# Patient Record
Sex: Male | Born: 1955 | Race: White | Hispanic: No | Marital: Married | Smoking: Former smoker
Health system: Southern US, Community
[De-identification: ages and names within clinical notes are randomized; demographics above are authoritative.]

## PROBLEM LIST (undated history)

## (undated) DIAGNOSIS — I480 Paroxysmal atrial fibrillation: Secondary | ICD-10-CM

## (undated) DIAGNOSIS — I499 Cardiac arrhythmia, unspecified: Secondary | ICD-10-CM

## (undated) DIAGNOSIS — Z8719 Personal history of other diseases of the digestive system: Secondary | ICD-10-CM

## (undated) DIAGNOSIS — I639 Cerebral infarction, unspecified: Secondary | ICD-10-CM

## (undated) DIAGNOSIS — H547 Unspecified visual loss: Secondary | ICD-10-CM

## (undated) DIAGNOSIS — E118 Type 2 diabetes mellitus with unspecified complications: Secondary | ICD-10-CM

## (undated) DIAGNOSIS — I96 Gangrene, not elsewhere classified: Secondary | ICD-10-CM

## (undated) DIAGNOSIS — I739 Peripheral vascular disease, unspecified: Secondary | ICD-10-CM

## (undated) DIAGNOSIS — H548 Legal blindness, as defined in USA: Secondary | ICD-10-CM

## (undated) DIAGNOSIS — I10 Essential (primary) hypertension: Secondary | ICD-10-CM

## (undated) DIAGNOSIS — I77810 Thoracic aortic ectasia: Secondary | ICD-10-CM

## (undated) DIAGNOSIS — R413 Other amnesia: Secondary | ICD-10-CM

## (undated) DIAGNOSIS — I63532 Cerebral infarction due to unspecified occlusion or stenosis of left posterior cerebral artery: Secondary | ICD-10-CM

## (undated) DIAGNOSIS — I2510 Atherosclerotic heart disease of native coronary artery without angina pectoris: Secondary | ICD-10-CM

## (undated) DIAGNOSIS — I70269 Atherosclerosis of native arteries of extremities with gangrene, unspecified extremity: Secondary | ICD-10-CM

## (undated) DIAGNOSIS — Z9289 Personal history of other medical treatment: Secondary | ICD-10-CM

## (undated) DIAGNOSIS — E785 Hyperlipidemia, unspecified: Secondary | ICD-10-CM

## (undated) DIAGNOSIS — Z9889 Other specified postprocedural states: Secondary | ICD-10-CM

## (undated) DIAGNOSIS — M79606 Pain in leg, unspecified: Secondary | ICD-10-CM

## (undated) DIAGNOSIS — R918 Other nonspecific abnormal finding of lung field: Secondary | ICD-10-CM

## (undated) DIAGNOSIS — I4891 Unspecified atrial fibrillation: Secondary | ICD-10-CM

## (undated) DIAGNOSIS — K219 Gastro-esophageal reflux disease without esophagitis: Secondary | ICD-10-CM

## (undated) DIAGNOSIS — D649 Anemia, unspecified: Secondary | ICD-10-CM

## (undated) DIAGNOSIS — K5520 Angiodysplasia of colon without hemorrhage: Secondary | ICD-10-CM

## (undated) HISTORY — DX: Cerebral infarction due to unspecified occlusion or stenosis of left posterior cerebral artery: I63.532

## (undated) HISTORY — DX: Thoracic aortic ectasia: I77.810

## (undated) HISTORY — DX: Other specified postprocedural states: Z98.890

## (undated) HISTORY — DX: Personal history of other diseases of the digestive system: Z87.19

## (undated) HISTORY — DX: Hyperlipidemia, unspecified: E78.5

## (undated) HISTORY — DX: Atherosclerotic heart disease of native coronary artery without angina pectoris: I25.10

## (undated) HISTORY — DX: Type 2 diabetes mellitus with unspecified complications: E11.8

## (undated) HISTORY — DX: Personal history of other medical treatment: Z92.89

## (undated) HISTORY — PX: APPENDECTOMY: SHX54

## (undated) HISTORY — DX: Gangrene, not elsewhere classified: I96

## (undated) HISTORY — PX: TONSILLECTOMY: SUR1361

## (undated) HISTORY — DX: Other nonspecific abnormal finding of lung field: R91.8

## (undated) HISTORY — DX: Essential (primary) hypertension: I10

## (undated) HISTORY — DX: Paroxysmal atrial fibrillation: I48.0

## (undated) HISTORY — PX: ABDOMINAL AORTA STENT: SHX1108

## (undated) HISTORY — PX: HERNIA REPAIR: SHX51

## (undated) HISTORY — DX: Atherosclerosis of native arteries of extremities with gangrene, unspecified extremity: I70.269

---

## 2015-01-21 ENCOUNTER — Other Ambulatory Visit: Payer: Self-pay

## 2015-01-21 ENCOUNTER — Other Ambulatory Visit: Payer: Self-pay | Admitting: Family Medicine

## 2015-01-21 ENCOUNTER — Encounter: Payer: Self-pay | Admitting: Family Medicine

## 2015-01-21 ENCOUNTER — Ambulatory Visit (INDEPENDENT_AMBULATORY_CARE_PROVIDER_SITE_OTHER): Payer: Managed Care, Other (non HMO) | Admitting: Family Medicine

## 2015-01-21 VITALS — BP 127/85 | HR 96 | Temp 98.5°F | Resp 18 | Ht 69.0 in | Wt 202.0 lb

## 2015-01-21 DIAGNOSIS — E785 Hyperlipidemia, unspecified: Secondary | ICD-10-CM

## 2015-01-21 DIAGNOSIS — E1165 Type 2 diabetes mellitus with hyperglycemia: Secondary | ICD-10-CM | POA: Diagnosis not present

## 2015-01-21 DIAGNOSIS — E119 Type 2 diabetes mellitus without complications: Secondary | ICD-10-CM

## 2015-01-21 DIAGNOSIS — I10 Essential (primary) hypertension: Secondary | ICD-10-CM | POA: Diagnosis not present

## 2015-01-21 DIAGNOSIS — H53131 Sudden visual loss, right eye: Secondary | ICD-10-CM

## 2015-01-21 DIAGNOSIS — E782 Mixed hyperlipidemia: Secondary | ICD-10-CM | POA: Insufficient documentation

## 2015-01-21 DIAGNOSIS — IMO0002 Reserved for concepts with insufficient information to code with codable children: Secondary | ICD-10-CM | POA: Insufficient documentation

## 2015-01-21 DIAGNOSIS — E669 Obesity, unspecified: Secondary | ICD-10-CM

## 2015-01-21 DIAGNOSIS — I129 Hypertensive chronic kidney disease with stage 1 through stage 4 chronic kidney disease, or unspecified chronic kidney disease: Secondary | ICD-10-CM | POA: Insufficient documentation

## 2015-01-21 DIAGNOSIS — H53139 Sudden visual loss, unspecified eye: Secondary | ICD-10-CM | POA: Insufficient documentation

## 2015-01-21 LAB — CBC WITH DIFFERENTIAL/PLATELET
Hematocrit: 41.7 %
Hemoglobin: 14.8 g/dL
LYMPHS ABS: 2 10*3/uL
Lymphs: 22 %
MCH: 31.3 pg
MCHC: 35.5 g/dL
MCV: 88 fL
MID (ABSOLUTE): 0.8 10*3/uL
MID: 9 %
Neutrophils Absolute: 6.1 10*3/uL
Neutrophils: 69 %
Platelets: 304 10*3/uL (ref 150–379)
RBC: 4.73 x10E6/uL
RDW: 12.5 %
WBC: 8.9 10*3/uL (ref 3.4–10.8)

## 2015-01-21 LAB — LIPID PANEL PICCOLO, WAIVED
CHOL/HDL RATIO PICCOLO,WAIVE: 4.5 mg/dL
CHOLESTEROL PICCOLO, WAIVED: 190 mg/dL (ref <200)
HDL Chol Piccolo, Waived: 42 mg/dL — ABNORMAL LOW (ref >59)
LDL Chol Calc Piccolo Waived: 101 mg/dL — ABNORMAL HIGH (ref <100)
Triglycerides Piccolo,Waived: 231 mg/dL — ABNORMAL HIGH (ref <150)
VLDL Chol Calc Piccolo,Waive: 46 mg/dL — ABNORMAL HIGH (ref <30)

## 2015-01-21 LAB — MICROALBUMIN, URINE WAIVED
CREATININE, URINE WAIVED: 300 mg/dL (ref 10–300)
MICROALB, UR WAIVED: 30 mg/L — AB (ref 0–19)
Microalb/Creat Ratio: <30 mg/g (ref <30)

## 2015-01-21 LAB — BAYER DCA HB A1C WAIVED: HB A1C (BAYER DCA - WAIVED): 8.5 % — ABNORMAL HIGH (ref <7.0)

## 2015-01-21 MED ORDER — METFORMIN HCL ER (OSM) 1000 MG PO TB24: 1000.0000 mg | ORAL_TABLET | Freq: Every day | ORAL | Status: DC

## 2015-01-21 NOTE — Assessment & Plan Note (Signed)
Under fair control with LDL 101 even not fasting. No need for meds just yet, continue to monitor. Recheck 6 months.

## 2015-01-21 NOTE — Patient Instructions (Addendum)
Smoking Cessation Quitting smoking is important to your health and has many advantages. However, it is not always easy to quit since nicotine is a very addictive drug. Oftentimes, people try 3 times or more before being able to quit. This document explains the best ways for you to prepare to quit smoking. Quitting takes hard work and a lot of effort, but you can do it. ADVANTAGES OF QUITTING SMOKING  You will live longer, feel better, and live better.  Your body will feel the impact of quitting smoking almost immediately.  Within 20 minutes, blood pressure decreases. Your pulse returns to its normal level.  After 8 hours, carbon monoxide levels in the blood return to normal. Your oxygen level increases.  After 24 hours, the chance of having a heart attack starts to decrease. Your breath, hair, and body stop smelling like smoke.  After 48 hours, damaged nerve endings begin to recover. Your sense of taste and smell improve.  After 72 hours, the body is virtually free of nicotine. Your bronchial tubes relax and breathing becomes easier.  After 2 to 12 weeks, lungs can hold more air. Exercise becomes easier and circulation improves.  The risk of having a heart attack, stroke, cancer, or lung disease is greatly reduced.  After 1 year, the risk of coronary heart disease is cut in half.  After 5 years, the risk of stroke falls to the same as a nonsmoker.  After 10 years, the risk of lung cancer is cut in half and the risk of other cancers decreases significantly.  After 15 years, the risk of coronary heart disease drops, usually to the level of a nonsmoker.  If you are pregnant, quitting smoking will improve your chances of having a healthy baby.  The people you live with, especially any children, will be healthier.  You will have extra money to spend on things other than cigarettes. QUESTIONS TO THINK ABOUT BEFORE ATTEMPTING TO QUIT You may want to talk about your answers with your  health care provider.  Why do you want to quit?  If you tried to quit in the past, what helped and what did not?  What will be the most difficult situations for you after you quit? How will you plan to handle them?  Who can help you through the tough times? Your family? Friends? A health care provider?  What pleasures do you get from smoking? What ways can you still get pleasure if you quit? Here are some questions to ask your health care provider:  How can you help me to be successful at quitting?  What medicine do you think would be best for me and how should I take it?  What should I do if I need more help?  What is smoking withdrawal like? How can I get information on withdrawal? GET READY  Set a quit date.  Change your environment by getting rid of all cigarettes, ashtrays, matches, and lighters in your home, car, or work. Do not let people smoke in your home.  Review your past attempts to quit. Think about what worked and what did not. GET SUPPORT AND ENCOURAGEMENT You have a better chance of being successful if you have help. You can get support in many ways.  Tell your family, friends, and coworkers that you are going to quit and need their support. Ask them not to smoke around you.  Get individual, group, or telephone counseling and support. Programs are available at local hospitals and health centers. Call   your local health department for information about programs in your area.  Spiritual beliefs and practices may help some smokers quit.  Download a "quit meter" on your computer to keep track of quit statistics, such as how long you have gone without smoking, cigarettes not smoked, and money saved.  Get a self-help book about quitting smoking and staying off tobacco. [place] yourself from urges to smoke. Talk to someone, go for a walk, or occupy your time with a task.  Change your normal routine. Take a different route to work.  Drink tea instead of coffee. Eat breakfast in a different place.  Reduce your stress. Take a hot bath, exercise, or read a book.  Plan something enjoyable to do every day. Reward yourself for not smoking.  Explore interactive web-based programs that specialize in helping you quit. GET MEDICINE AND USE IT CORRECTLY Medicines can help you stop smoking and decrease the urge to smoke. Combining medicine with the above behavioral methods and support can greatly increase your chances of successfully quitting smoking.  Nicotine replacement therapy helps deliver nicotine to your body without the negative effects and risks of smoking. Nicotine replacement therapy includes nicotine gum, lozenges, inhalers, nasal sprays, and skin patches. Some may be available over-the-counter and others require a prescription.  Antidepressant medicine helps people abstain from smoking, but how this works is unknown. This medicine is available by prescription.  Nicotinic receptor partial agonist medicine simulates the effect of nicotine in your brain. This medicine is available by prescription. Ask your health care provider for advice about which medicines to use and how to use them based on your health history. Your health care provider will tell you what side effects to look out for if you choose to be on a medicine or therapy. Carefully read the information on the package. Do not use any other product containing nicotine while using a nicotine replacement product.  RELAPSE OR DIFFICULT SITUATIONS Most relapses occur within the first 3 months after quitting. Do not be discouraged if you start smoking again. Remember, most people try several times before finally quitting. You may have symptoms of withdrawal because your body is used to nicotine. You may crave cigarettes, be irritable, feel very hungry, cough often, get headaches, or have difficulty concentrating. The withdrawal symptoms are only temporary. They are strongest  when you first quit, but they will go away within 10-14 days. To reduce the chances of relapse, try to:  Avoid drinking alcohol. Drinking lowers your chances of successfully quitting.  Reduce the amount of caffeine you consume. Once you quit smoking, the amount of caffeine in your body increases and can give you symptoms, such as a rapid heartbeat, sweating, and anxiety.  Avoid smokers because they can make you want to smoke.  Do not let weight gain distract you. Many smokers will gain weight when they quit, usually less than 10 pounds. Eat a healthy diet and stay active. You can always lose the weight gained after you quit.  Find ways to improve your mood other than smoking. FOR MORE INFORMATION  www.smokefree.gov  Document Released: 07/14/2001 Document Revised: 12/04/2013 Document Reviewed: 10/29/2011 [facility] Patient Information 2015 [place], [place]. This information is not intended to replace advice given to you by your health care provider. Make sure you discuss any questions you have with your health care provider. Ischemic Stroke A stroke (cerebrovascular accident) is the sudden death of brain tissue. It is a medical emergency. A stroke can cause  permanent loss of brain function. This can cause problems with different parts of your body. A transient ischemic attack (TIA) is different because it does not cause permanent damage. A TIA is a short-lived problem of poor blood flow affecting a part of the brain. A TIA is also a serious problem because having a TIA greatly increases the chances of having a stroke. When symptoms first develop, you cannot know if the problem might be a stroke or a TIA. CAUSES  A stroke is caused by a decrease of oxygen supply to an area of your brain. It is usually the result of a small blood clot or collection of cholesterol or fat (plaque) that blocks blood flow in the brain. A stroke can also be caused by blocked or damaged carotid arteries.  RISK  FACTORS  High blood pressure (hypertension).  High cholesterol.  Diabetes mellitus.  Heart disease.  The buildup of plaque in the blood vessels (peripheral artery disease or atherosclerosis).  The buildup of plaque in the blood vessels providing blood and oxygen to the brain (carotid artery stenosis).  An abnormal heart rhythm (atrial fibrillation).  Obesity.  Smoking.  Taking oral contraceptives (especially in combination with smoking).  Physical inactivity.  A diet high in fats, salt (sodium), and calories.  Alcohol use.  Use of illegal drugs (especially cocaine and methamphetamine).  Being African American.  Being over the age of 55.  Family history of stroke.  Previous history of blood clots, stroke, TIA, or heart attack.  Sickle cell disease. SYMPTOMS  These symptoms usually develop suddenly, or may be newly present upon awakening from sleep:  Sudden weakness or numbness of the face, arm, or leg, especially on one side of the body.  Sudden trouble walking or difficulty moving arms or legs.  Sudden confusion.  Sudden personality changes.  Trouble speaking (aphasia) or understanding.  Difficulty swallowing.  Sudden trouble seeing in one or both eyes.  Double vision.  Dizziness.  Loss of balance or coordination.  Sudden severe headache with no known cause.  Trouble reading or writing. DIAGNOSIS  Your health care provider can often determine the presence or absence of a stroke based on your symptoms, history, and physical exam. Computed tomography (CT) of the brain is usually performed to confirm the stroke, determine causes, and determine stroke severity. Other tests may be done to find the cause of the stroke. These tests may include:  Electrocardiography.  Continuous heart monitoring.  Echocardiography.  Carotid ultrasonography.  Magnetic resonance imaging (MRI).  A scan of the brain circulation.  Blood tests. PREVENTION  The risk  of a stroke can be decreased by appropriately treating high blood pressure, high cholesterol, diabetes, heart disease, and obesity and by quitting smoking, limiting alcohol, and staying physically active. TREATMENT  Time is of the essence. It is important to seek treatment at the first sign of these symptoms because you may receive a medicine to dissolve the clot (thrombolytic) that cannot be given if too much time has passed since your symptoms began. Even if you do not know when your symptoms began, get treatment as soon as possible as there are other treatment options available including oxygen, intravenous (IV) fluids, and medicines to thin the blood (anticoagulants). Treatment of stroke depends on the duration, severity, and cause of your symptoms. Medicines and dietary changes may be used to address diabetes, high blood pressure, and other risk factors. Physical, speech, and occupational therapists will assess you and work with you to improve any functions  impaired by the stroke. Measures will be taken to prevent short-term and long-term complications, including infection from breathing foreign material into the lungs (aspiration pneumonia), blood clots in the legs, bedsores, and falls. Rarely, surgery may be needed to remove large blood clots or to open up blocked arteries. HOME CARE INSTRUCTIONS   Take medicines only as directed by your health care provider. Follow the directions carefully. Medicines may be used to control risk factors for a stroke. Be sure you understand all your medicine instructions.  You may be told to take a medicine to thin the blood, such as aspirin or the anticoagulant warfarin. Warfarin needs to be taken exactly as instructed.  Too much and too little warfarin are both dangerous. Too much warfarin increases the risk of bleeding. Too little warfarin continues to allow the risk for blood clots. While taking warfarin, you will need to have regular blood tests to measure your  blood clotting time. These blood tests usually include both the PT and INR tests. The PT and INR results allow your health care provider to adjust your dose of warfarin. The dose can change for many reasons. It is critically important that you take warfarin exactly as prescribed, and that you have your PT and INR levels drawn exactly as directed.  Many foods, especially foods high in vitamin K, can interfere with warfarin and affect the PT and INR results. Foods high in vitamin K include spinach, kale, broccoli, cabbage, collard and turnip greens, brussels sprouts, peas, cauliflower, seaweed, and parsley, as well as beef and pork liver, green tea, and soybean oil. You should eat a consistent amount of foods high in vitamin K. Avoid major changes in your diet, or notify your health care provider before changing your diet. Arrange a visit with a dietitian to answer your questions.  Many medicines can interfere with warfarin and affect the PT and INR results. You must tell your health care provider about any and all medicines you take. This includes all vitamins and supplements. Be especially cautious with aspirin and anti-inflammatory medicines. Do not take or discontinue any prescribed or over-the-counter medicine except on the advice of your health care provider or pharmacist.  Warfarin can have side effects, such as excessive bruising or bleeding. You will need to hold pressure over cuts for longer than usual. Your health care provider or pharmacist will discuss other potential side effects.  Avoid sports or activities that may cause injury or bleeding.  Be mindful when shaving, flossing your teeth, or handling sharp objects.  Alcohol can change the body's ability to handle warfarin. It is best to avoid alcoholic drinks or consume only very small amounts while taking warfarin. Notify your health care provider if you change your alcohol intake.  Notify your dentist or other health care providers before  procedures.  If swallow studies have determined that your swallowing reflex is present, you should eat healthy foods. Including 5 or more servings of fruits and vegetables a day may reduce the risk of stroke. Foods may need to be a certain consistency (soft or pureed), or small bites may need to be taken in order to avoid aspirating or choking. Certain dietary changes may be advised to address high blood pressure, high cholesterol, diabetes, or obesity.  Food choices that are low in sodium, saturated fat, trans fat, and cholesterol are recommended to manage high blood pressure.  Food choies that are high in fiber, and low in saturated fat, trans fat, and cholesterol may control cholesterol levels.  Controlling carbohydrates and sugar intake is recommended to manage diabetes.  Reducing calorie intake and making food choices that are low in sodium, saturated fat, trans fat, and cholesterol are recommended to manage obesity.  Maintain a healthy weight.  Stay physically active. It is recommended that you get at least 30 minutes of activity on all or most days.  Do not use any tobacco products including cigarettes, chewing tobacco, or electronic cigarettes.  Limit alcohol use even if you are not taking warfarin. Moderate alcohol use is considered to be:  No more than 2 drinks each day for men.  No more than 1 drink each day for nonpregnant women.  Home safety. A safe home environment is important to reduce the risk of falls. Your health care provider may arrange for specialists to evaluate your home. Having grab bars in the bedroom and bathroom is often important. Your health care provider may arrange for equipment to be used at home, such as raised toilets and a seat for the shower.  Physical, occupational, and speech therapy. Ongoing therapy may be needed to maximize your recovery after a stroke. If you have been advised to use a walker or a cane, use it at all times. Be sure to keep your  therapy appointments.  Follow all instructions for follow-up with your health care provider. This is very important. This includes any referrals, physical therapy, rehabilitation, and lab tests. Proper follow-up can prevent another stroke from occurring. SEEK MEDICAL CARE IF:  You have personality changes.  You have difficulty swallowing.  You are seeing double.  You have dizziness.  You have a fever.  You have skin breakdown. SEEK IMMEDIATE MEDICAL CARE IF:  Any of these symptoms may represent a serious problem that is an emergency. Do not wait to see if the symptoms will go away. Get medical help right away. Call your local emergency services (911 in [place] Do not drive yourself to the hospital.  You have sudden weakness or numbness of the face, arm, or leg, especially on one side of the body.  You have sudden trouble walking or difficulty moving arms or legs.  You have sudden confusion.  You have trouble speaking (aphasia) or understanding.  You have sudden trouble seeing in one or both eyes.  You have a loss of balance or coordination.  You have a sudden, severe headache with no known cause.  You have new chest pain or an irregular heartbeat.  You have a partial or total loss of consciousness. Document Released: 07/20/2005 Document Revised: 12/04/2013 Document Reviewed: 02/28/2012 [facility] Patient Information 2015 [place], [place]. This information is not intended to replace advice given to you by your health care provider. Make sure you discuss any questions you have with your health care provider.

## 2015-01-21 NOTE — Assessment & Plan Note (Signed)
Not well controlled at this time with A1c of 8.5. Will restart metformin and work on diet and exercise. CMP checked today. Continue to monitor.

## 2015-01-21 NOTE — Assessment & Plan Note (Signed)
Under goof control at this time. CMP checked today. Continue current regimen, continue to monitor.

## 2015-01-21 NOTE — Assessment & Plan Note (Addendum)
Strong concern for stroke given visual field loss, gait disturbance and recent memory loss. Will obtain CT of the head and carotid ultrasound, not ordering them STAT as this started over [place] day and has been going on for 3-4 weeks. Referral made to neurology. Await results. Follow up 1 week.

## 2015-01-21 NOTE — Progress Notes (Signed)
BP 127/85 mmHg  Pulse 96  Temp(Src) 98.5 F (36.9 C)  Resp 18  Ht 5\' 9"  (1.753 m)  Wt 202 lb (91.627 kg)  BMI 29.82 kg/m2   Subjective:    Patient ID: [name], male    DOB: 1956-02-07, 59 y.o.   MRN: [id number]  HPI: [name] is a 59 y.o. male who presents today at the request of his optometrist for acute isolated vision loss in R eye to [place] plaque. He is requesting an evaluation for HTN, DM, Cholesterol and possibly cardiac and carotid eval.   Chief Complaint  Patient presents with  . Diabetes    Patient does not check blood sugar at home, states that Malachy Mood took him off his blood sugar.   Marland Kitchen Blurred Vision    Patient went to see Dr. Ellin Mayhew his eye doctor last week, and he told the patient he may have had a mini stroke. Patient recieved and order to evaluate hypertension and DM and cholesterol.  . Hypertension    Patient is still taking his blood pressure medication.   Had a really bad headache on Memorial day weekend, and then couldn't see out of his right eye out of the side of his eye. He went to his eye doctor Wednesday last week, and they were concerned about a "ministroke". His wife notes that his memory has been off. He notes that he cannot see out of the R side of his R eye.  HYPERTENSION / HYPERLIPIDEMIA Satisfied with current treatment? yes Duration of hypertension: chronic BP monitoring frequency: rarely BP range: highest 130s BP medication side effects: no Duration of hyperlipidemia: chronic Cholesterol supplements: none Past cholesterol medications: none Aspirin: no Recent stressors: no Recurrent headaches: yes Visual changes: yes Palpitations: no Dyspnea: no Chest pain: no Lower extremity edema: no Dizzy/lightheaded: yes- one time when he got very dizzy last week, gone now.   DIABETES Hypoglycemic episodes:no Polydipsia/polyuria: yes Visual disturbance: yes Chest pain: no Paresthesias: no Glucose Monitoring: no Taking Insulin?: no Blood  Pressure Monitoring: rarely Retinal Examination: Up to Date Foot Exam: Not up to Date Diabetic Education: Completed Aspirin: no  Relevant past medical, surgical, family and social history reviewed and updated as indicated. Interim medical history since our last visit reviewed. Allergies and medications reviewed and updated.  Review of Systems  Constitutional: Negative.   Eyes: Positive for visual disturbance. Negative for photophobia, pain, discharge, redness and itching.  Respiratory: Negative.   Cardiovascular: Negative.   Neurological: Positive for light-headedness and headaches. Negative for dizziness, tremors, seizures, syncope, facial asymmetry, speech difficulty, weakness and numbness.  Psychiatric/Behavioral: Negative.    Per HPI unless specifically indicated above    Objective:    BP 127/85 mmHg  Pulse 96  Temp(Src) 98.5 F (36.9 C)  Resp 18  Ht 5\' 9"  (1.753 m)  Wt 202 lb (91.627 kg)  BMI 29.82 kg/m2  Wt Readings from Last 3 Encounters:  01/21/15 202 lb (91.627 kg)  11/18/14 197 lb (89.359 kg)    Physical Exam  Constitutional: He is oriented to person, place, and time. He appears well-developed and well-nourished.  HENT:  Head: Normocephalic and atraumatic.  Eyes: Conjunctivae and EOM are normal. Pupils are equal, round, and reactive to light. Right eye exhibits no discharge. Left eye exhibits no discharge. No scleral icterus.  Neck: Normal range of motion. Neck supple.  No bruit  Cardiovascular: Normal rate, regular rhythm and normal heart sounds.  Exam reveals no gallop and no friction rub.   No  murmur heard. Pulmonary/Chest: Effort normal and breath sounds normal. No respiratory distress. He has no wheezes. He has no rales. He exhibits no tenderness.  Neurological: He is alert and oriented to person, place, and time. He is not disoriented. He displays no atrophy and no tremor. No cranial nerve deficit or sensory deficit. He exhibits normal muscle tone. He  displays a negative Romberg sign. He displays no seizure activity. Gait abnormal. Coordination normal. He displays no Babinski's sign on the right side. He displays Babinski's sign on the left side.  Short term memory deficit, asking several questions in a row that we had just answered with no memory of asking them Slightly wide based off gait  Skin: Skin is warm and dry. No rash noted. No erythema. No pallor.  Psychiatric: He has a normal mood and affect. His speech is normal and behavior is normal. Thought content normal. Cognition and memory are impaired. He does not express impulsivity or inappropriate judgment. He exhibits abnormal recent memory. He exhibits normal remote memory.  Nursing note and vitals reviewed.   No results found for this or any previous visit.    Assessment & Plan:   Problem List Items Addressed This Visit      Cardiovascular and Mediastinum   HTN (hypertension)    Under goof control at this time. CMP checked today. Continue current regimen, continue to monitor.         Other   DM (diabetes mellitus), type 2, uncontrolled    Not well controlled at this time with A1c of 8.5. Will restart metformin and work on diet and exercise. CMP checked today. Continue to monitor.       Relevant Medications   metformin (FORTAMET) 1000 MG (OSM) 24 hr tablet   Hyperlipidemia    Under fair control with LDL 101 even not fasting. No need for meds just yet, continue to monitor. Recheck 6 months.       Sudden loss of vision - Primary    Strong concern for stroke given visual field loss, gait disturbance and recent memory loss. Will obtain CT of the head and carotid ultrasound, not ordering them STAT as this started over [place] day and has been going on for 3-4 weeks. Referral made to neurology. Await results. Follow up 1 week.       Relevant Orders   CT Head Wo Contrast   US Carotid Bilateral   Ambulatory referral to Neurology       Follow up plan: Return in about 1  week (around 01/28/2015).

## 2015-01-23 ENCOUNTER — Encounter: Payer: Self-pay | Admitting: Family Medicine

## 2015-01-24 ENCOUNTER — Ambulatory Visit (INDEPENDENT_AMBULATORY_CARE_PROVIDER_SITE_OTHER): Payer: Managed Care, Other (non HMO) | Admitting: Family Medicine

## 2015-01-24 ENCOUNTER — Encounter: Payer: Self-pay | Admitting: Family Medicine

## 2015-01-24 ENCOUNTER — Ambulatory Visit
Admission: RE | Admit: 2015-01-24 | Discharge: 2015-01-24 | Disposition: A | Discharge status: Home / Self Care | Payer: Managed Care, Other (non HMO) | Source: Ambulatory Visit | Attending: Family Medicine | Admitting: Family Medicine

## 2015-01-24 ENCOUNTER — Telehealth: Payer: Self-pay

## 2015-01-24 VITALS — BP 127/83 | HR 97 | Temp 98.3°F | Wt 200.6 lb

## 2015-01-24 DIAGNOSIS — Z720 Tobacco use: Secondary | ICD-10-CM

## 2015-01-24 DIAGNOSIS — H53131 Sudden visual loss, right eye: Secondary | ICD-10-CM | POA: Insufficient documentation

## 2015-01-24 DIAGNOSIS — I6340 Cerebral infarction due to embolism of unspecified cerebral artery: Secondary | ICD-10-CM | POA: Diagnosis not present

## 2015-01-24 DIAGNOSIS — Z87891 Personal history of nicotine dependence: Secondary | ICD-10-CM | POA: Insufficient documentation

## 2015-01-24 DIAGNOSIS — E1165 Type 2 diabetes mellitus with hyperglycemia: Secondary | ICD-10-CM | POA: Diagnosis not present

## 2015-01-24 DIAGNOSIS — E785 Hyperlipidemia, unspecified: Secondary | ICD-10-CM | POA: Diagnosis not present

## 2015-01-24 DIAGNOSIS — IMO0002 Reserved for concepts with insufficient information to code with codable children: Secondary | ICD-10-CM

## 2015-01-24 DIAGNOSIS — I639 Cerebral infarction, unspecified: Secondary | ICD-10-CM | POA: Insufficient documentation

## 2015-01-24 IMAGING — US US CAROTID DUPLEX BILAT
1 series · 13 of 24 positions shown · non-contrast
Comparison: None.

CLINICAL DATA: Right-sided vision loss

EXAM:
BILATERAL CAROTID DUPLEX ULTRASOUND
TECHNIQUE: Gray scale imaging, color Doppler and duplex ultrasound were
performed of bilateral carotid and vertebral arteries in the neck.

[Series 1: us carotid duplex bilat · 13 of 57 slices shown]
[im 1/57]
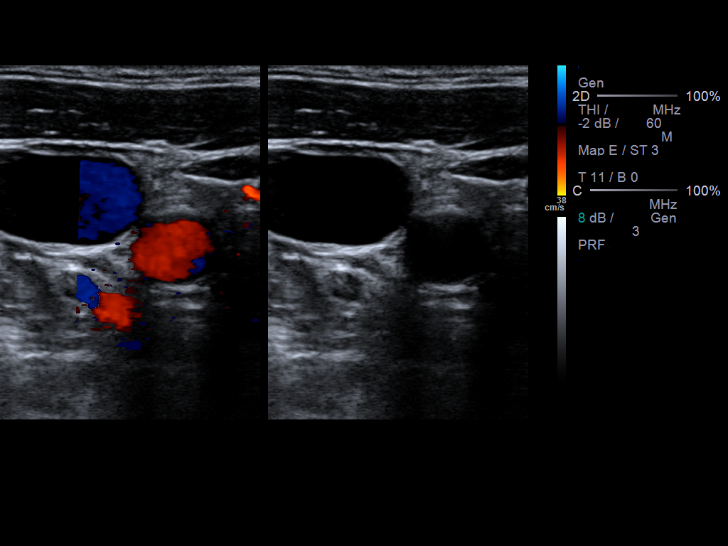
[im 5/57]
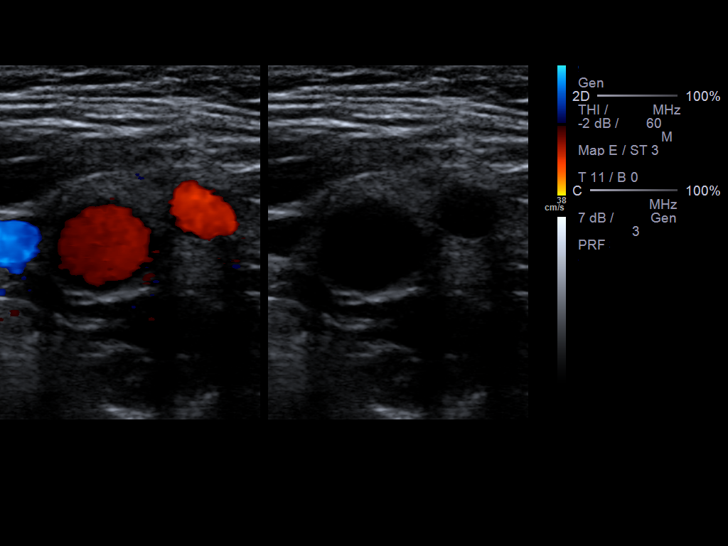
[im 10/57]
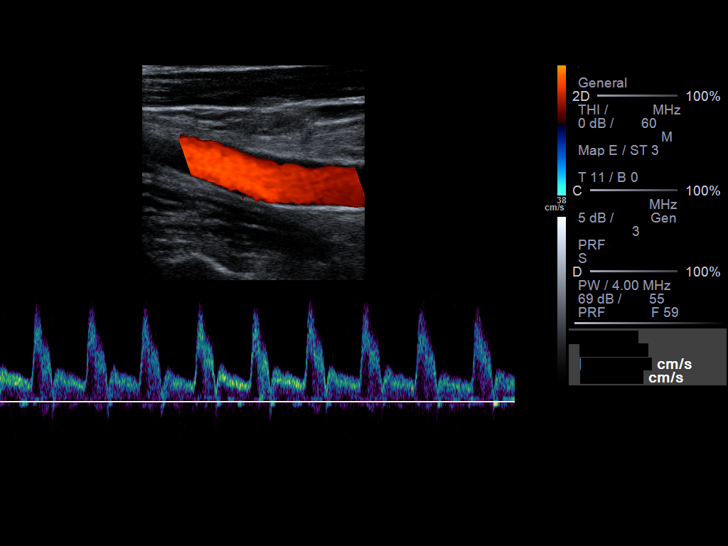
[im 15/57]
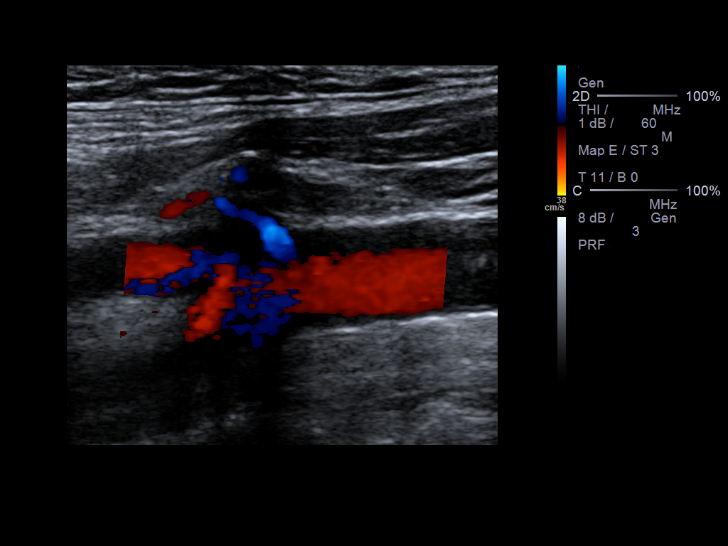
[im 20/57]
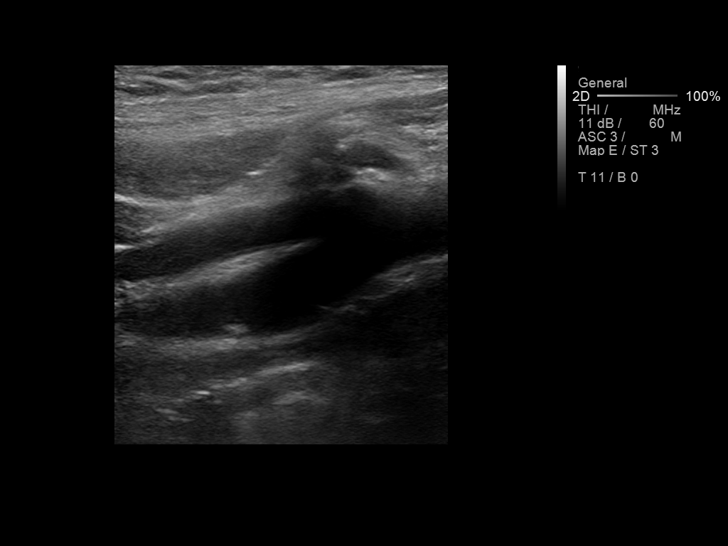
[im 25/57]
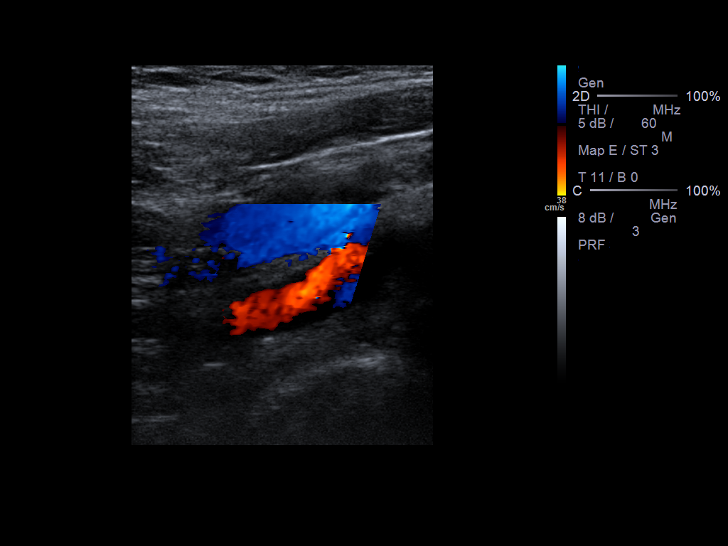
[im 30/57]
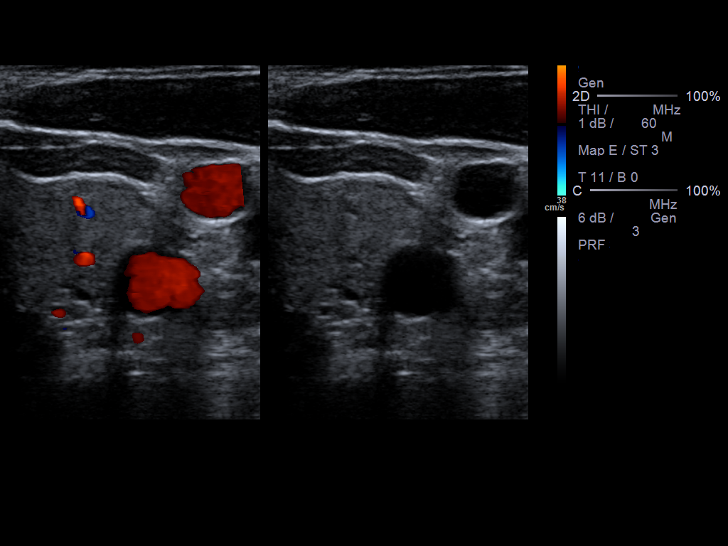
[im 32/57]
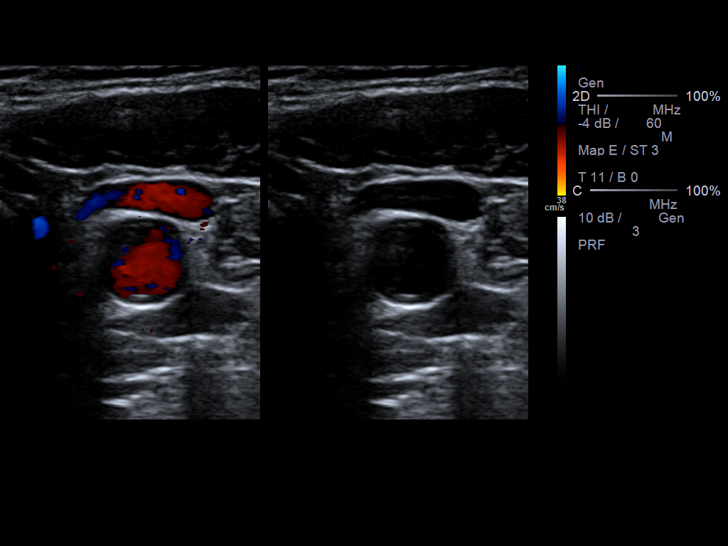
[im 37/57]
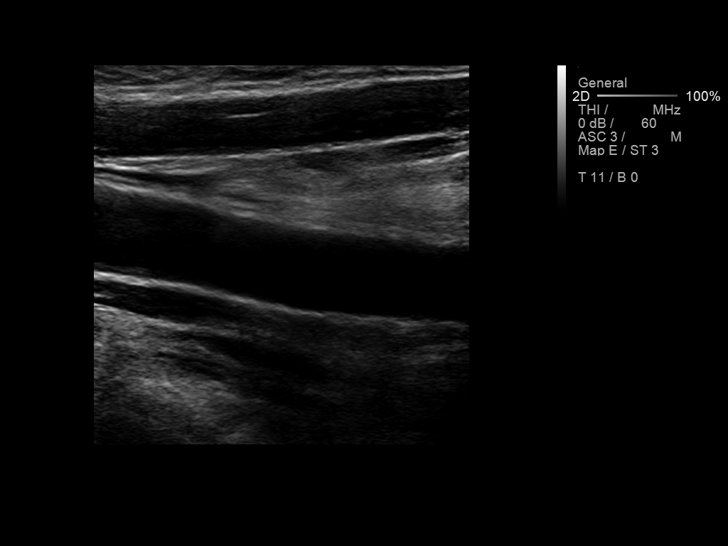
[im 42/57]
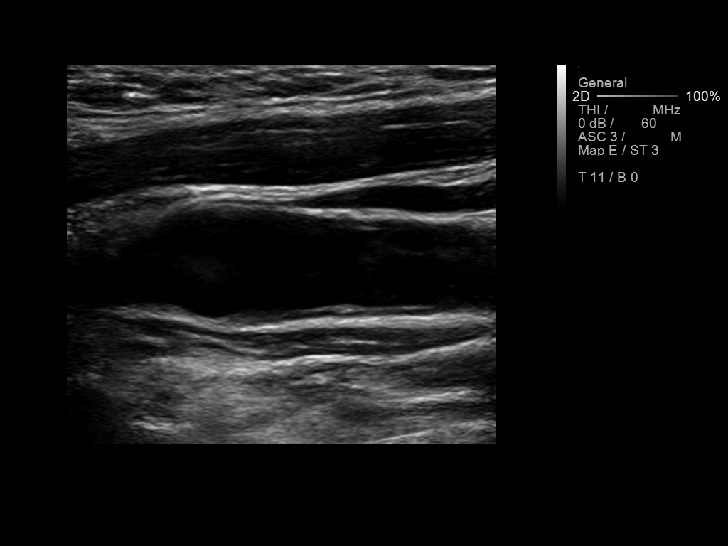
[im 47/57]
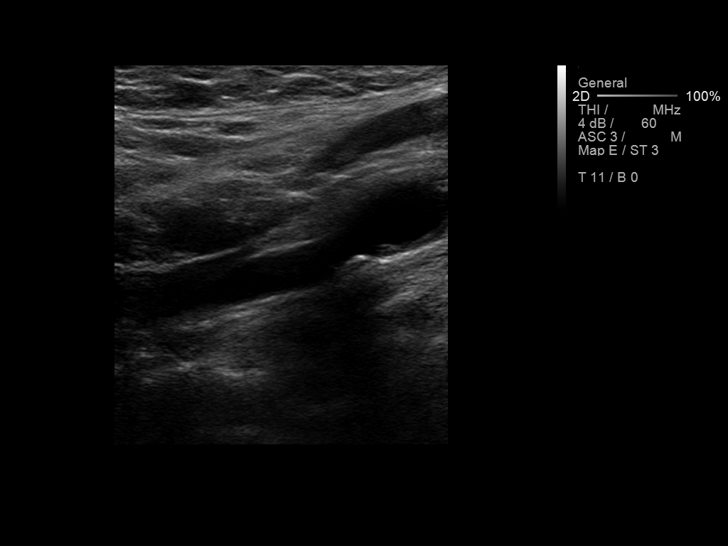
[im 52/57]
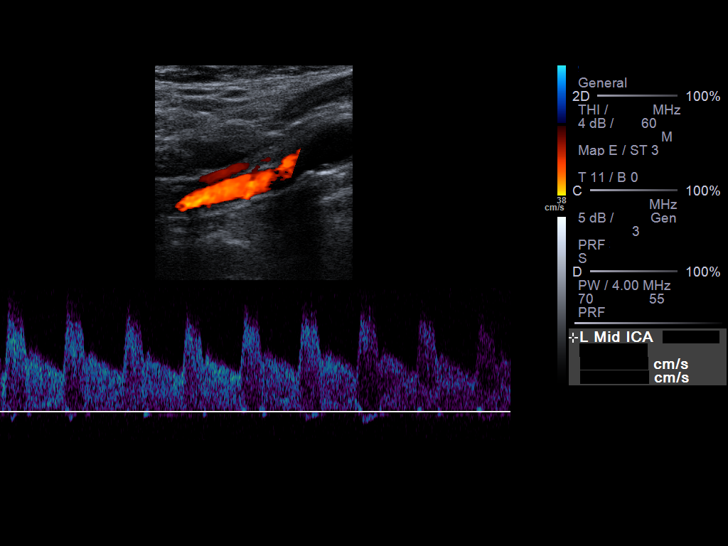
[im 57/57]
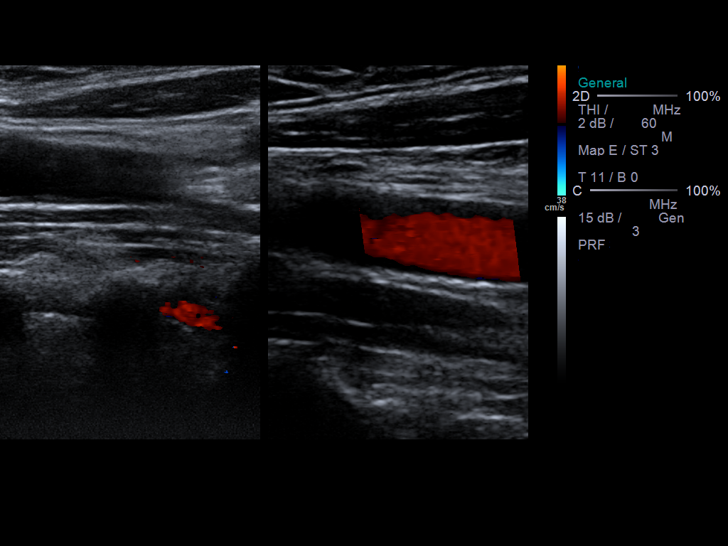

[13 of 24 positions shown; findings below may reference images not displayed]

FINDINGS: Criteria: Quantification of carotid stenosis is based on velocity
parameters that correlate the residual internal carotid diameter
with NASCET-based stenosis levels, using the diameter of the distal
internal carotid lumen as the denominator for stenosis measurement.

The following velocity measurements were obtained:

RIGHT

ICA:  108/30 cm/sec

CCA:  103/25 cm/sec

SYSTOLIC ICA/CCA RATIO:

DIASTOLIC ICA/CCA RATIO:

ECA:  100 cm/sec

LEFT

ICA:  99/38 cm/sec

CCA:  93/25 cm/sec

SYSTOLIC ICA/CCA RATIO:

DIASTOLIC ICA/CCA RATIO:

ECA:  71 cm/sec

RIGHT CAROTID ARTERY: Initial grayscale images show mild intimal
thickening and atherosclerotic plaque in the region of the carotid
bulb. The waveforms, velocities and flow velocity ratios however
demonstrate no evidence of focal hemodynamically significant
stenosis.

RIGHT VERTEBRAL ARTERY:  Antegrade in nature.

LEFT CAROTID ARTERY: Initial grayscale images demonstrate minimal
plaque formation within the proximal internal carotid artery. The
waveforms, velocities and flow velocity ratios however demonstrate
no evidence of focal hemodynamically significant stenosis.

LEFT VERTEBRAL ARTERY:  Antegrade in nature.
IMPRESSION: Mild plaque formation bilaterally. No focal carotid stenosis is
noted.

## 2015-01-24 IMAGING — CT CT HEAD W/O CM
1 series · 15 of 29 positions shown, 19 images · non-contrast
Comparison: None.

CLINICAL DATA: Extreme headache on Memorial Day with 7 loss of
right-sided peripheral vision.

EXAM:
CT HEAD WITHOUT CONTRAST
TECHNIQUE: Contiguous axial images were obtained from the base of the skull
through the vertex without intravenous contrast.

[Series 2: head wo · axial · 0.39mm/px · z∈[-91,+39]mm · 15 of 29 slices shown, 19 images]
[im 2/29  brain]
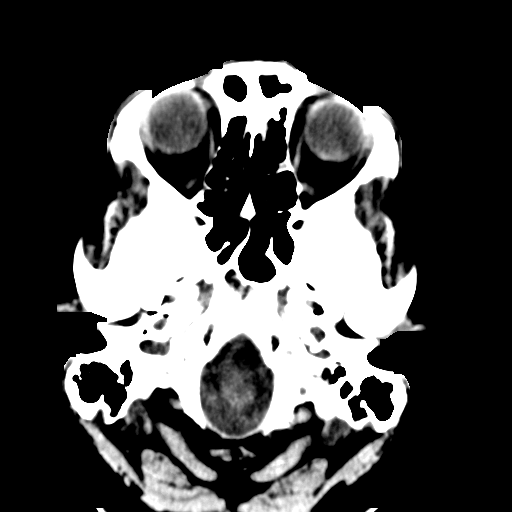
[im 2/29  bone]
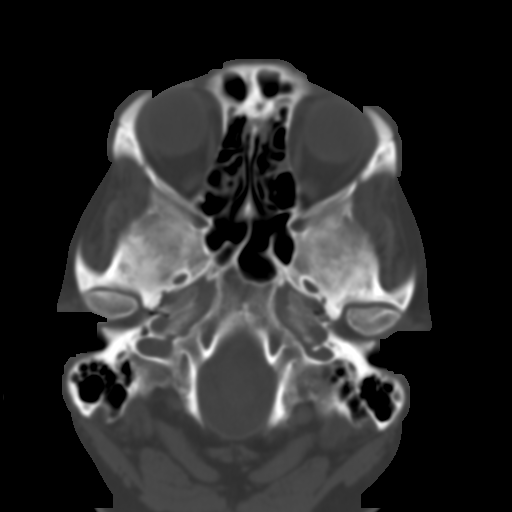
[im 4/29  brain]
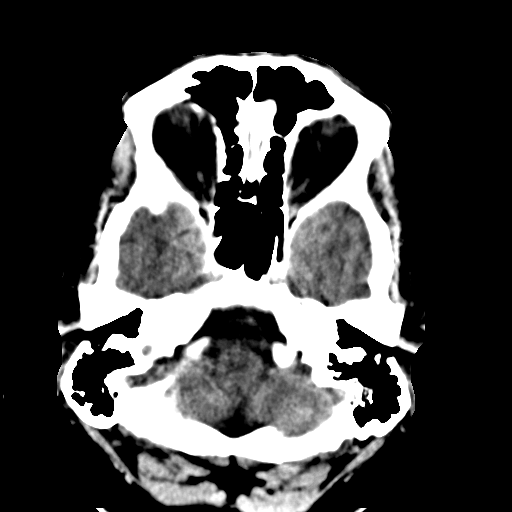
[im 6/29  brain]
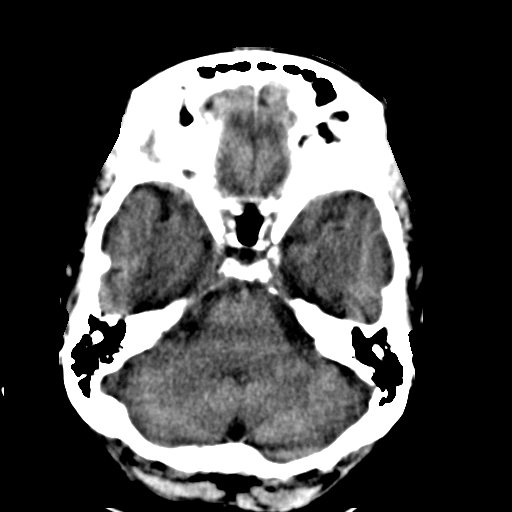
[im 8/29  brain]
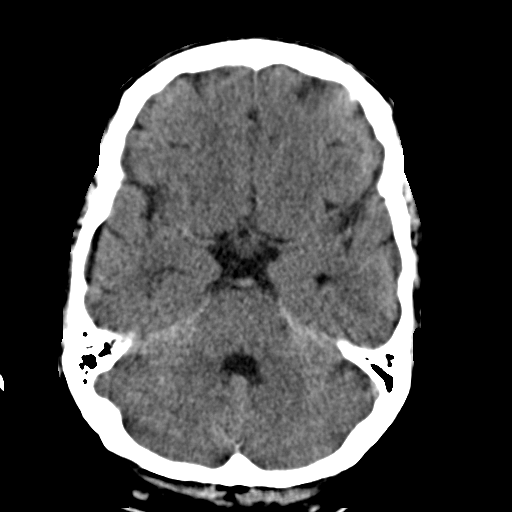
[im 10/29  brain]
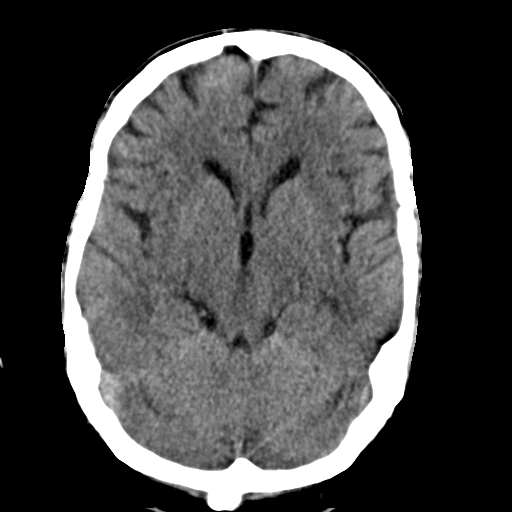
[im 10/29  bone]
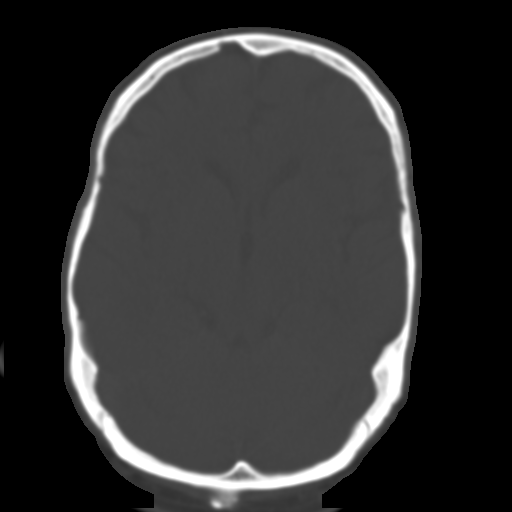
[im 11/29  brain]
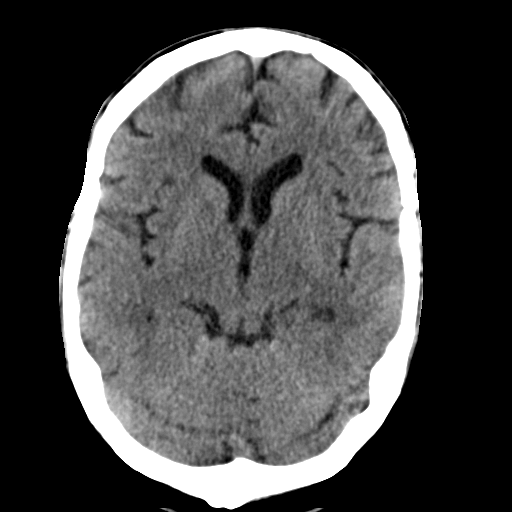
[im 13/29  brain]
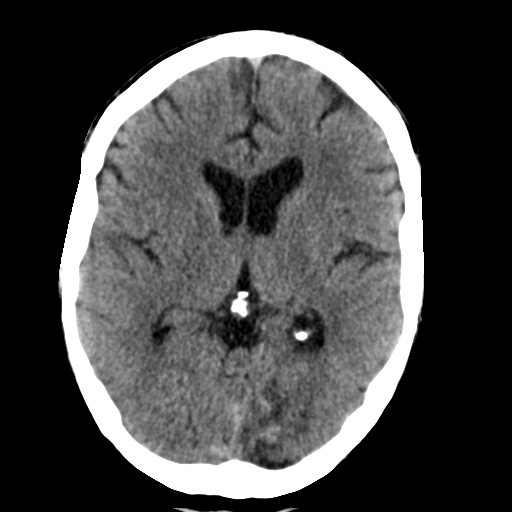
[im 15/29  brain]
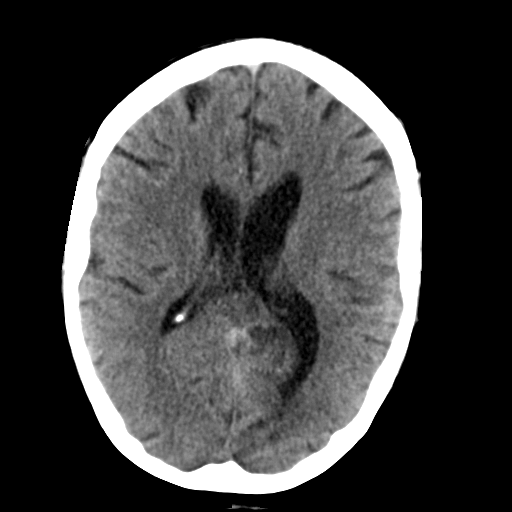
[im 17/29  brain]
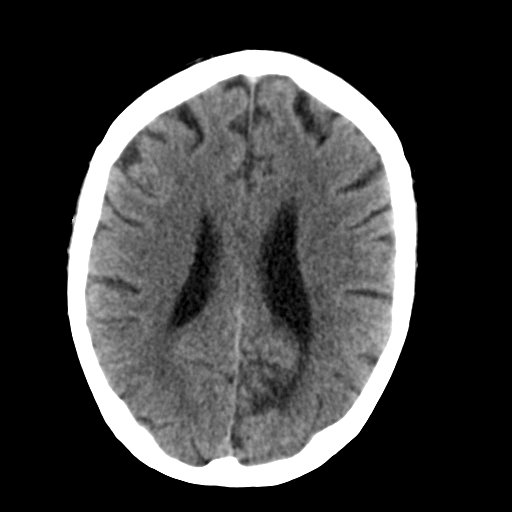
[im 17/29  bone]
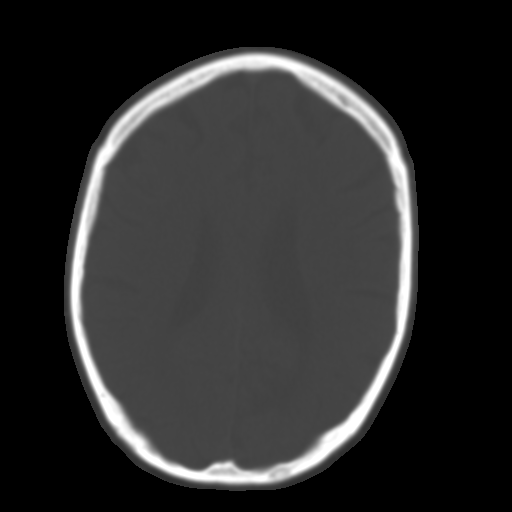
[im 19/29  brain]
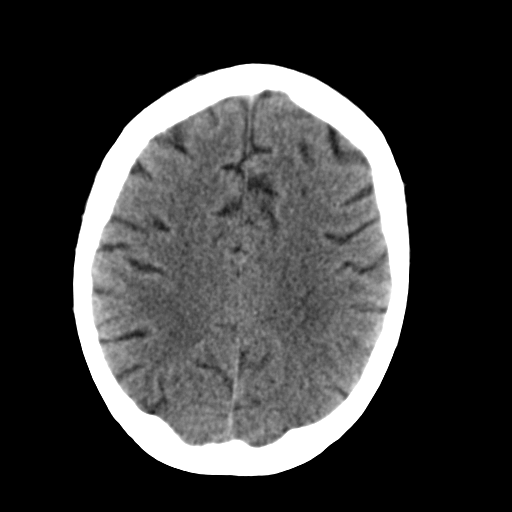
[im 20/29  brain]
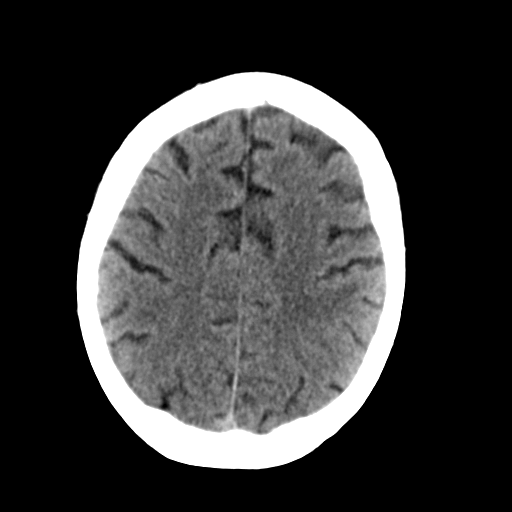
[im 22/29  brain]
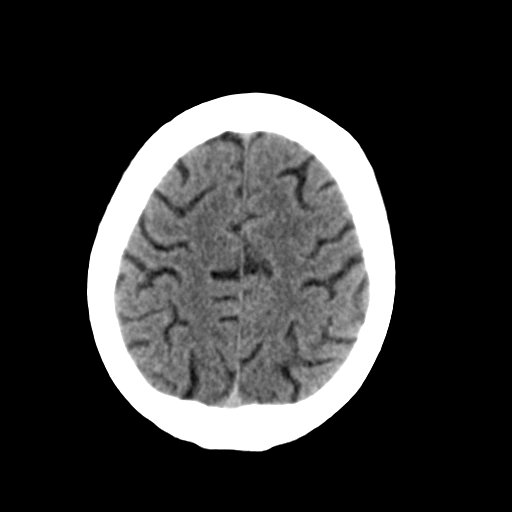
[im 24/29  brain]
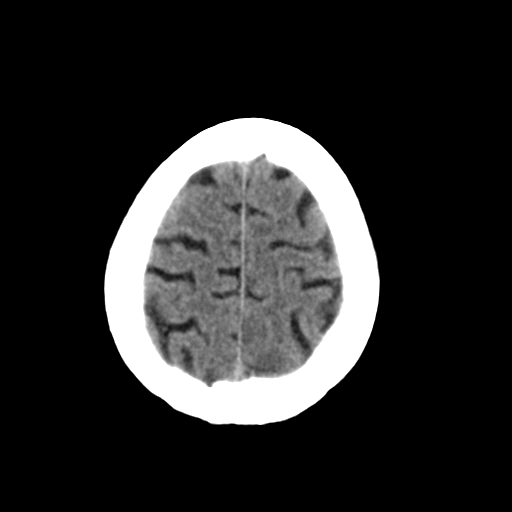
[im 24/29  bone]
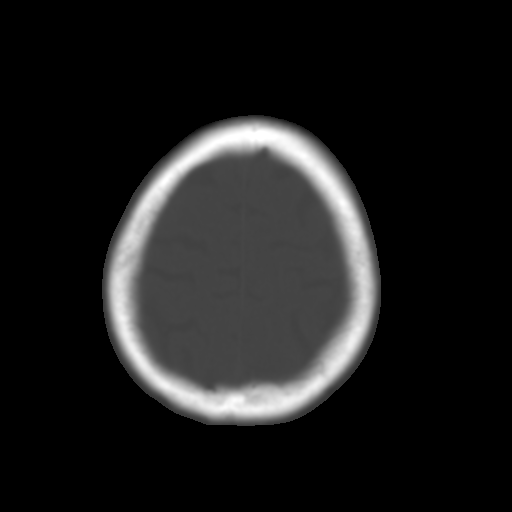
[im 26/29  brain]
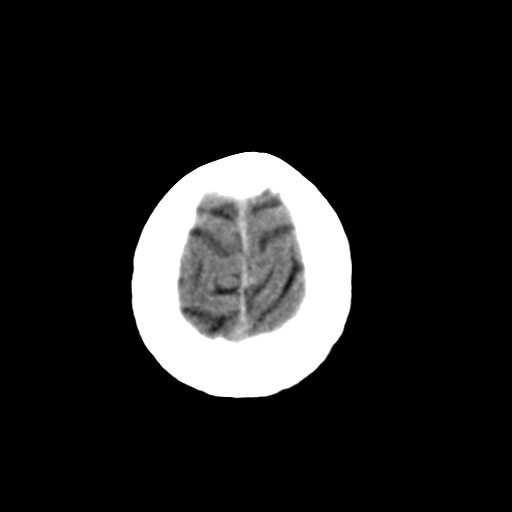
[im 28/29  brain]
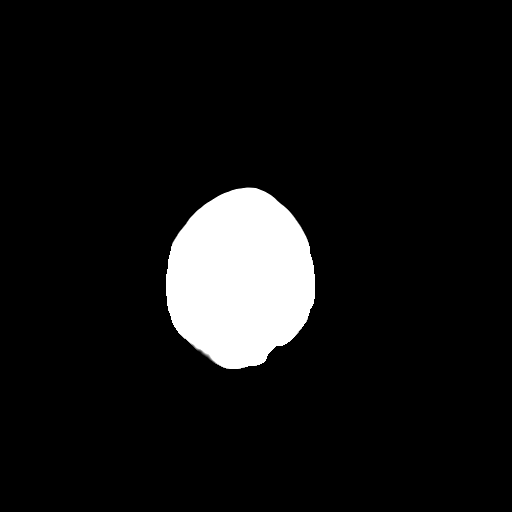

[15 of 29 positions shown; findings below may reference images not displayed]

FINDINGS: Age-indeterminate lacunar infarct within the left basal ganglia
(image 14, series 2).

Rather ill-defined area of encephalomalacia involving the medial
aspect of the left occipital cortex (representative images 14 and
17, series 2), favored is sequela of remote infarct given
commensurate ex vacuo dilatation of the adjacent occipital horn of
the left lateral ventricle.

Scattered periventricular hypodensities compatible with
microvascular ischemic disease. Mild likely age-appropriate atrophy
with sulcal prominence. No intraparenchymal or extra-axial mass.
Otherwise normal size and configuration of the ventricles and
basilar cisterns. No midline shift.

Intracranial atherosclerosis. Limited visualization the paranasal
sinuses and mastoid air cells is normal. No air-fluid levels.
Regional soft tissues appear normal. No displaced calvarial
fracture.
IMPRESSION: 1. Age-indeterminate lacunar infarct within the left basal ganglia.
Further evaluation could be performed with brain MRI as clinically
indicated.
2. Suspected old infarct involving the left occipital cortex. Again,
this could be further evaluated at the time of brain MRI.
3. Mild, likely age-appropriate atrophy and microvascular ischemic
disease.

## 2015-01-24 MED ORDER — BUPROPION HCL ER (SR) 150 MG PO TB12: ORAL_TABLET | ORAL | Status: DC

## 2015-01-24 MED ORDER — ATORVASTATIN CALCIUM 40 MG PO TABS: 40.0000 mg | ORAL_TABLET | Freq: Every day | ORAL | Status: DC

## 2015-01-24 NOTE — Progress Notes (Signed)
BP 127/83 mmHg  Pulse 97  Temp(Src) 98.3 F (36.8 C)  Wt 200 lb 9.6 oz (90.992 kg)  SpO2 97%   Subjective:    Patient ID: [name], male    DOB: 12/04/1955, 59 y.o.   MRN: [id number]  HPI: [name] is a 59 y.o. male  Chief Complaint  Patient presents with  . Results   [name] comes in today for results on his CT and carotid ultrasounds. He got very anxious this morning and his hands started tingling when he got anxious about going into the CT. Discussed with patient and his wife that it does appear that he had a stroke- an age indeterminate lacunar infarct in his L basal ganglia and a suspected old infarct in the L occipital cortex. Due to the recent nature of his symptoms, I suspect this is new. Recommended to patient that we get him an MRI to determine age but he has severe claustrophobia and wanted to wait. Referral to neurology made. He was to see [facility], but they don't want to drive that far. Would like to see [facility] if able to get in to see them sooner.   Patient is interested in quitting smoking. After discussion last visit, would like to try wellbutrin. He is otherwise feeling well today with no other concerns or complaints at this time.   Relevant past medical, surgical, family and social history reviewed and updated as indicated. Interim medical history since our last visit reviewed. Allergies and medications reviewed and updated.  Review of Systems  Constitutional: Negative.   Eyes: Positive for visual disturbance. Negative for photophobia, pain, discharge, redness and itching.  Respiratory: Negative.   Cardiovascular: Negative.   Musculoskeletal: Negative.   Psychiatric/Behavioral: Negative.     Per HPI unless specifically indicated above     Objective:    BP 127/83 mmHg  Pulse 97  Temp(Src) 98.3 F (36.8 C)  Wt 200 lb 9.6 oz (90.992 kg)  SpO2 97%  Wt Readings from Last 3 Encounters:  01/24/15 200 lb 9.6 oz (90.992 kg)  01/21/15 202 lb (91.627 kg)   11/18/14 197 lb (89.359 kg)    Physical Exam  Constitutional: He is oriented to person, place, and time. He appears well-developed and well-nourished. No distress.  HENT:  Head: Normocephalic and atraumatic.  Right Ear: Hearing normal.  Left Ear: Hearing normal.  Nose: Nose normal.  Eyes: Conjunctivae and lids are normal. Right eye exhibits no discharge. Left eye exhibits no discharge. No scleral icterus.  Pulmonary/Chest: Effort normal. No respiratory distress.  Musculoskeletal: Normal range of motion.  Neurological: He is alert and oriented to person, place, and time.  Skin: Skin is intact. No rash noted.  Psychiatric: He has a normal mood and affect. His speech is normal and behavior is normal. Judgment and thought content normal. Cognition and memory are normal.  Nursing note and vitals reviewed.   Results for orders placed or performed in visit on 01/21/15  CBC With Differential/Platelet  Result Value Ref Range   WBC 8.9 3.4 - 10.8 x10E3/uL   RBC 4.73 x10E6/uL   Hemoglobin 14.8 g/dL   Hematocrit 41.7 %   MCV 88 fL   MCH 31.3 pg   MCHC 35.5 g/dL   RDW 12.5 %   Platelets 304 150 - 379 x10E3/uL   NEUTROPHILS 69 %   Lymphs 22 %   MID 9 %   Neutrophils Absolute 6.1 x10E3/uL   Lymphocytes Absolute 2.0 x10E3/uL   MID (Absolute) 0.8  X10E3/uL  Lipid Panel Piccolo, [employer]  Result Value Ref Range   Cholesterol Piccolo, Waived 190 <200 mg/dL   HDL Chol Piccolo, Waived 42 (L) >59 mg/dL   Triglycerides Piccolo,Waived 231 (H) <150 mg/dL   Chol/HDL Ratio Piccolo,Waive 4.5 mg/dL   LDL Chol Calc Piccolo Waived 101 (H) <100 mg/dL   VLDL Chol Calc Piccolo,Waive 46 (H) <30 mg/dL  Microalbumin, Urine Waived  Result Value Ref Range   Microalb, Ur Waived 30 (H) 0 - 19 mg/L   Creatinine, Urine Waived 300 10 - 300 mg/dL   Microalb/Creat Ratio <30 <30 mg/g  Bayer DCA Hb A1c Waived  Result Value Ref Range   Bayer DCA Hb A1c Waived 8.5 (H) <7.0 %      Assessment & Plan:   Problem  List Items Addressed This Visit      Cardiovascular and Mediastinum   Stroke, acute, embolic - Primary    Referral to neurology made, awaiting appointment. Patient wants to hold on MRI at this time due to claustrophobia. Will wait on MRI as stroke appears to be clear. 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 Will fill out temporary disability paperwork when they come in. Walking slightly ataxic, but will hold on PT at this time. Definite memory deficit. Will keep DM, BP and cholesterol under control. Will continue daily aspirin. Carotid ultrasound showed no sign of stenosis.       Relevant Medications   atorvastatin (LIPITOR) 40 MG tablet     Other   DM (diabetes mellitus), type 2, uncontrolled    Just restarted on metformin on Monday. Continue to monitor.       Relevant Medications   atorvastatin (LIPITOR) 40 MG tablet   Hyperlipidemia    Borderline cholesterol, however in the presence of a stroke, needs treatment. Rx for atorvastatin sent to his pharmacy. Will check cholesterol and LFTs at follow up in 1 month.       Relevant Medications   atorvastatin (LIPITOR) 40 MG tablet   Tobacco abuse    Stressed the importance of stopping smoking to prevent future strokes. He is interested in wellbutrin. Risks and benefits discussed today. Will start wellbutrin and stop smoking after 2 weeks. Call with any concerns.           Follow up plan: Return in about 4 weeks (around 02/21/2015) for follow up smoking and stroke.

## 2015-01-24 NOTE — Patient Instructions (Signed)
Stroke Prevention Some health problems and behaviors may make it more likely for you to have a stroke. Below are ways to lessen your risk of having a stroke.   Be active for at least 30 minutes on most or all days.  Do not smoke. Try not to be around others who smoke.  Do not drink too much alcohol.  Do not have more than 2 drinks a day if you are a man.  Do not have more than 1 drink a day if you are a woman and are not pregnant.  Eat healthy foods, such as fruits and vegetables. If you were put on a specific diet, follow the diet as told.  Keep your cholesterol levels under control through diet and medicines. Look for foods that are low in saturated fat, trans fat, cholesterol, and are high in fiber.  If you have diabetes, follow all diet plans and take your medicine as told.  If you have high blood pressure (hypertension), follow all diet plans and take your medicine as told.  Keep a healthy weight. Eat foods that are low in calories, salt, saturated fat, trans fat, and cholesterol.  Do not take drugs.  Avoid birth control pills, if this applies. Talk to your doctor about the risks of taking birth control pills.  Talk to your doctor if you have sleep problems (sleep apnea).  Take all medicine as told by your doctor.  You may be told to take aspirin or blood thinner medicine. Take this medicine as told by your doctor.  Understand your medicine instructions.  Make sure any other conditions you have are being taken care of. GET HELP RIGHT AWAY IF:  You suddenly lose feeling (you feel numb) or have weakness in your face, arm, or leg.  Your face or eyelid hangs down to one side.  You suddenly feel confused.  You have trouble talking (aphasia) or understanding what people are saying.  You suddenly have trouble seeing in one or both eyes.  You suddenly have trouble walking.  You are dizzy.  You lose your balance or your movements are clumsy (uncoordinated).  You  suddenly have a very bad headache and you do not know the cause.  You have new chest pain.  Your heart feels like it is fluttering or skipping a beat (irregular heartbeat). Do not wait to see if the symptoms above go away. Get help right away. Call your local emergency services (911 in [place] Do not drive yourself to the hospital. Document Released: 01/19/2012 Document Revised: 12/04/2013 Document Reviewed: 01/20/2013 [facility] Patient Information 2015 [place], [place]. This information is not intended to replace advice given to you by your health care provider. Make sure you discuss any questions you have with your health care provider. Ischemic Stroke Blood carries oxygen to all areas of your body. A stroke happens when your blood does not flow to your brain like normal. If this happens, your brain will not get the oxygen it needs and brain tissue will die. This is an emergency. Problems (symptoms) of a stroke usually happen suddenly. You may notice them when you wake up. They can include:  Loss of feeling or weakness on one side of the body (face, arm, leg).  Feeling confused.  Trouble talking or understanding.  Trouble seeing.  Trouble walking.  Feeling dizzy.  Loss of balance or coordination.  Severe headache without a cause.  Trouble reading or writing. Get help as soon as any of these problems first start. This is  important.  RISK FACTORS  Risk factors are things that make it more likely for you to have a stroke. These things include:  High blood pressure (hypertension).  High cholesterol.  Diabetes.  Heart disease.  Having a buildup of fatty deposits in the blood vessels.  Having an abnormal heart rhythm (atrial fibrillation).  Being very overweight (obese).  Smoking.  Taking birth control pills, especially if you smoke.  Not being active.  Having a diet high in fats, salt, and calories.  Drinking too much alcohol.  Using illegal drugs.  Being African  American.  Being over the age of 81.  Having a family history of stroke.  Having a history of blood clots, stroke, warning stroke (transient ischemic attack, TIA), or heart attack.  Sickle cell disease. HOME CARE  Take all medicines exactly as told by your doctor. Understand all your medicine instructions.  You may need to take a medicine to thin your blood, like aspirin or warfarin. Take warfarin exactly as told.  Taking too much or too little warfarin is dangerous. Get regular blood tests as told, including the PT and INR tests. The test results help your doctor adjust your dose of warfarin. Your PT and INR levels must be done as often as told by your doctor.  Food can cause problems with warfarin and affect the results of your blood tests. This is true for foods high in vitamin K, such as spinach, kale, broccoli, cabbage, collard and turnip greens, Brussels sprouts, peas, cauliflower, seaweed, and parsley, as well as beef and pork liver, green tea, and soybean oil. Eat the same amount of food high in vitamin K. Avoid major changes in your diet. Tell your doctor before changing your diet. Talk to a food specialist (dietitian) if you have questions.  Many medicines can cause problems with warfarin and affect your PT and INR test results. Tell your doctor about all medicines you take. This includes vitamins and dietary pills (supplements). Be careful with aspirin and medicines that relieve redness, soreness, and puffiness (inflammation). Do not take or stop medicines unless your doctor tells you to.  Warfarin can cause a lot of bruising or bleeding. Hold pressure over cuts for longer than normal. Talk to your doctor about other side effects of warfarin.  Avoid sports or activities that may cause injury or bleeding.  Be careful when you shave, floss your teeth, or use sharp objects.  Avoid alcoholic drinks or drink very little alcohol while taking warfarin. Tell your doctor if you change  how much alcohol you drink.  Tell your dentist and other doctors that you take warfarin before procedures.  If you are able to swallow, eat healthy foods. Eat 5 or more servings of fruits and vegetables a day. Eat soft foods, pureed foods, or eat small bites of food so you do not choke.  Follow your diet program as told, if you are given one.  Keep a healthy weight.  Stay active. Try to get at least 30 minutes of activity on most or all days.  Do not smoke.  Limit how much alcohol you drink even if you are not taking warfarin. Moderate alcohol use is:  No more than 2 drinks each day for men.  No more than 1 drink each day for women who are not pregnant.  Keep your home safe so you do not fall. Try:  Putting grab bars in the bedroom and bathroom.  Raising toilet seats.  Putting a seat in the shower.  Go to therapy sessions (physical, occupational, and speech) as told by your doctor.  Use a walker or cane at all times if told to do so.  Keep all doctor visits as told. GET HELP IF:  Your personality changes.  You have trouble swallowing.  You are seeing two of everything.  You are dizzy.  You have a fever.  Your skin starts to break down. GET HELP RIGHT AWAY IF:  The symptoms below may be a sign of an emergency. Do not wait to see if the symptoms go away. Call for help (911 in [place] Do not drive yourself to the hospital.  You have sudden weakness or numbness on the face, arm, or leg (especially on one side of the body).  You have sudden trouble walking or moving your arms or legs.  You have sudden confusion.  You have trouble talking or understanding.  You have sudden trouble seeing in one or both eyes.  You lose your balance or your movements are not smooth.  You have a sudden, severe headache with no known cause.  You have new chest pain or you feel your heart beating in an unsteady way.  You are partly or totally unaware of what is going on around  you. Document Released: 07/09/2011 Document Revised: 12/04/2013 Document Reviewed: 02/28/2012 [facility] Patient Information 2015 [place], [place]. This information is not intended to replace advice given to you by your health care provider. Make sure you discuss any questions you have with your health care provider. You Can Quit Smoking If you are ready to quit smoking or are thinking about it, congratulations! You have chosen to help yourself be healthier and live longer! There are lots of different ways to quit smoking. Nicotine gum, nicotine patches, a nicotine inhaler, or nicotine nasal spray can help with physical craving. Hypnosis, support groups, and medicines help break the habit of smoking. TIPS TO GET OFF AND STAY OFF CIGARETTES  Learn to predict your moods. Do not let a bad situation be your excuse to have a cigarette. Some situations in your life might tempt you to have a cigarette.  Ask friends and co-workers not to smoke around you.  Make your home smoke-free.  Never have "just one" cigarette. It leads to wanting another and another. Remind yourself of your decision to quit.  On a card, make a list of your reasons for not smoking. Read it at least the same number of times a day as you have a cigarette. Tell yourself everyday, "I do not want to smoke. I choose not to smoke."  Ask someone at home or work to help you with your plan to quit smoking.  Have something planned after you eat or have a cup of coffee. Take a walk or get other exercise to perk you up. This will help to keep you from overeating.  Try a relaxation exercise to calm you down and decrease your stress. Remember, you may be tense and nervous the first two weeks after you quit. This will pass.  Find new activities to keep your hands busy. Play with a pen, coin, or rubber band. Doodle or draw things on paper.  Brush your teeth right after eating. This will help cut down the craving for the taste of tobacco after  meals. You can try mouthwash too.  Try gum, breath mints, or diet candy to keep something in your mouth. IF YOU SMOKE AND WANT TO QUIT:  Do not stock up on cigarettes. Never buy a  carton. Wait until one pack is finished before you buy another.  Never carry cigarettes with you at work or at home.  Keep cigarettes as far away from you as possible. Leave them with someone else.  Never carry matches or a lighter with you.  Ask yourself, "Do I need this cigarette or is this just a reflex?"  Bet with someone that you can quit. Put cigarette money in a piggy bank every morning. If you smoke, you give up the money. If you do not smoke, by the end of the week, you keep the money.  Keep trying. It takes 21 days to change a habit!  Talk to your doctor about using medicines to help you quit. These include nicotine replacement gum, lozenges, or skin patches. Document Released: 05/16/2009 Document Revised: 10/12/2011 Document Reviewed: 05/16/2009 [facility] Patient Information 2015 [place], [place]. This information is not intended to replace advice given to you by your health care provider. Make sure you discuss any questions you have with your health care provider. Smoking Cessation, Tips for Success If you are ready to quit smoking, congratulations! You have chosen to help yourself be healthier. Cigarettes bring nicotine, tar, carbon monoxide, and other irritants into your body. Your lungs, heart, and blood vessels will be able to work better without these poisons. There are many different ways to quit smoking. Nicotine gum, nicotine patches, a nicotine inhaler, or nicotine nasal spray can help with physical craving. Hypnosis, support groups, and medicines help break the habit of smoking. WHAT THINGS CAN I DO TO MAKE QUITTING EASIER?  Here are some tips to help you quit for good:  Pick a date when you will quit smoking completely. Tell all of your friends and family about your plan to quit on that  date.  Do not try to slowly cut down on the number of cigarettes you are smoking. Pick a quit date and quit smoking completely starting on that day.  Throw away all cigarettes.   Clean and remove all ashtrays from your home, work, and car.  On a card, write down your reasons for quitting. Carry the card with you and read it when you get the urge to smoke.  Cleanse your body of nicotine. Drink enough water and fluids to keep your urine clear or pale yellow. Do this after quitting to flush the nicotine from your body.  Learn to predict your moods. Do not let a bad situation be your excuse to have a cigarette. Some situations in your life might tempt you into wanting a cigarette.  Never have "just one" cigarette. It leads to wanting another and another. Remind yourself of your decision to quit.  Change habits associated with smoking. If you smoked while driving or when feeling stressed, try other activities to replace smoking. Stand up when drinking your coffee. Brush your teeth after eating. Sit in a different chair when you read the paper. Avoid alcohol while trying to quit, and try to drink fewer caffeinated beverages. Alcohol and caffeine may urge you to smoke.  Avoid foods and drinks that can trigger a desire to smoke, such as sugary or spicy foods and alcohol.  Ask people who smoke not to smoke around you.  Have something planned to do right after eating or having a cup of coffee. For example, plan to take a walk or exercise.  Try a relaxation exercise to calm you down and decrease your stress. Remember, you may be tense and nervous for the first 2 weeks after  you quit, but this will pass.  Find new activities to keep your hands busy. Play with a pen, coin, or rubber band. Doodle or draw things on paper.  Brush your teeth right after eating. This will help cut down on the craving for the taste of tobacco after meals. You can also try mouthwash.   Use oral substitutes in place of  cigarettes. Try using lemon drops, carrots, cinnamon sticks, or chewing gum. Keep them handy so they are available when you have the urge to smoke.  When you have the urge to smoke, try deep breathing.  Designate your home as a nonsmoking area.  If you are a heavy smoker, ask your health care provider about a prescription for nicotine chewing gum. It can ease your withdrawal from nicotine.  Reward yourself. Set aside the cigarette money you save and buy yourself something nice.  Look for support from others. Join a support group or smoking cessation program. Ask someone at home or at work to help you with your plan to quit smoking.  Always ask yourself, "Do I need this cigarette or is this just a reflex?" Tell yourself, "Today, I choose not to smoke," or "I do not want to smoke." You are reminding yourself of your decision to quit.  Do not replace cigarette smoking with electronic cigarettes (commonly called e-cigarettes). The safety of e-cigarettes is unknown, and some may contain harmful chemicals.  If you relapse, do not give up! Plan ahead and think about what you will do the next time you get the urge to smoke. HOW WILL I FEEL WHEN I QUIT SMOKING? You may have symptoms of withdrawal because your body is used to nicotine (the addictive substance in cigarettes). You may crave cigarettes, be irritable, feel very hungry, cough often, get headaches, or have difficulty concentrating. The withdrawal symptoms are only temporary. They are strongest when you first quit but will go away within 10-14 days. When withdrawal symptoms occur, stay in control. Think about your reasons for quitting. Remind yourself that these are signs that your body is healing and getting used to being without cigarettes. Remember that withdrawal symptoms are easier to treat than the major diseases that smoking can cause.  Even after the withdrawal is over, expect periodic urges to smoke. However, these cravings are generally  short lived and will go away whether you smoke or not. Do not smoke! WHAT RESOURCES ARE AVAILABLE TO HELP ME QUIT SMOKING? Your health care provider can direct you to community resources or hospitals for support, which may include:  Group support.  Education.  Hypnosis.  Therapy. Document Released: 04/17/2004 Document Revised: 12/04/2013 Document Reviewed: 01/05/2013 [facility] Patient Information 2015 [place], [place]. This information is not intended to replace advice given to you by your health care provider. Make sure you discuss any questions you have with your health care provider.

## 2015-01-24 NOTE — Assessment & Plan Note (Signed)
Referral to neurology made, awaiting appointment. Patient wants to hold on MRI at this time due to claustrophobia. Will wait on MRI as stroke appears to be clear. 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 Will fill out temporary disability paperwork when they come in. Walking slightly ataxic, but will hold on PT at this time. Definite memory deficit. Will keep DM, BP and cholesterol under control. Will continue daily aspirin. Carotid ultrasound showed no sign of stenosis.

## 2015-01-24 NOTE — Telephone Encounter (Signed)
Called and scheduled patient for an appointment at 230 today.

## 2015-01-24 NOTE — Assessment & Plan Note (Signed)
Just restarted on metformin on Monday. Continue to monitor.

## 2015-01-24 NOTE — Assessment & Plan Note (Signed)
Borderline cholesterol, however in the presence of a stroke, needs treatment. Rx for atorvastatin sent to his pharmacy. Will check cholesterol and LFTs at follow up in 1 month.

## 2015-01-24 NOTE — Telephone Encounter (Signed)
-----   Message from [name], DO sent at 01/24/2015  8:52 AM EDT ----- Can we see if we can get him in this afternoon to talk about his results.

## 2015-01-24 NOTE — Assessment & Plan Note (Signed)
Stressed the importance of stopping smoking to prevent future strokes. He is interested in wellbutrin. Risks and benefits discussed today. Will start wellbutrin and stop smoking after 2 weeks. Call with any concerns.

## 2015-01-25 LAB — COMPREHENSIVE METABOLIC PANEL
ALK PHOS: 78 IU/L
ALT: 45 IU/L
AST: 47 IU/L — AB (ref 0–40)
Albumin/Globulin Ratio: 1.5 (ref 1.1–2.5)
Albumin: 4.3 g/dL
BILIRUBIN TOTAL: 0.6 mg/dL
BUN/Creatinine Ratio: 14
BUN: 11 mg/dL
CHLORIDE: 93 mmol/L — AB (ref 97–108)
CO2: 26 mmol/L (ref 18–29)
Calcium: 10.2 mg/dL
Creatinine, Ser: 0.77 mg/dL
GLOBULIN, TOTAL: 2.9 g/dL (ref 1.5–4.5)
Glucose: 273 mg/dL — ABNORMAL HIGH (ref 65–99)
Potassium: 4.9 mmol/L (ref 3.5–5.2)
SODIUM: 135 mmol/L (ref 134–144)
Total Protein: 7.2 g/dL (ref 6.0–8.5)

## 2015-01-25 LAB — TSH: TSH: 1.75 u[IU]/mL (ref 0.450–4.500)

## 2015-01-28 ENCOUNTER — Ambulatory Visit: Payer: Managed Care, Other (non HMO) | Admitting: Family Medicine

## 2015-01-29 ENCOUNTER — Telehealth: Payer: Self-pay | Admitting: Family Medicine

## 2015-01-29 NOTE — Telephone Encounter (Signed)
PT CAME IN WITH FMLA PAPERWORK TO BE FILLED OUT AND LEFT IT UP FRONT. I'VE PLACED IT IN A FOLDER BECAUSE I DIDN'T WANT IT TO GET MISPLACED.

## 2015-01-31 NOTE — Telephone Encounter (Signed)
-----   Message from [name], DO sent at 01/31/2015  8:22 AM EDT ----- OK for him to come pick up his FMLA form. Thanks!

## 2015-01-31 NOTE — Telephone Encounter (Signed)
Called and left a voicemail to let patient know the paperwork is finished and upfront for him to pick up.

## 2015-02-01 ENCOUNTER — Telehealth: Payer: Self-pay | Admitting: Family Medicine

## 2015-02-01 NOTE — Telephone Encounter (Signed)
Pt came and bought paperwork back said that the 3rd to last page was not complete. Pt left paperwork up front.

## 2015-02-06 ENCOUNTER — Telehealth: Payer: Self-pay | Admitting: Family Medicine

## 2015-02-06 NOTE — Telephone Encounter (Signed)
Pt came in asking for extension on work note.

## 2015-02-06 NOTE — Telephone Encounter (Signed)
Forwarded to Dr. Johnson.

## 2015-02-07 NOTE — Telephone Encounter (Signed)
Letter generated OK for him to come pick up.

## 2015-02-07 NOTE — Telephone Encounter (Signed)
Called to notify patient that the letter was ready, when I called the patient stated that he had left the [facility] paperwork here again for his Short Term Disability because it was not completed correctly. I checked with the front, they gave me the form, it was completed and put up front for patient to pick up.

## 2015-02-11 ENCOUNTER — Telehealth: Payer: Self-pay

## 2015-02-11 NOTE — Telephone Encounter (Signed)
Patient and his wife stopped by to inquire about the status of the FMLA paperwork.  FMLA forms were completed by Dr. Wynetta Emery and picked up by patient.  Patient would like his FMLA papers to be sent to [facility] [employer] [facility] and will return tomorrow to bring fax number and release form.

## 2015-02-13 ENCOUNTER — Telehealth: Payer: Self-pay

## 2015-02-13 NOTE — Telephone Encounter (Signed)
Received a form for Short Term Disability from [place], the form was filled out and signed by Dr.Johnson. Patient was notified that a copy had been faxed back to the company and received a confirmation and a copy was placed in the folder for him to pick up.There is also a copy to be scanned into his chart.

## 2015-02-17 DIAGNOSIS — I63532 Cerebral infarction due to unspecified occlusion or stenosis of left posterior cerebral artery: Secondary | ICD-10-CM | POA: Insufficient documentation

## 2015-02-17 DIAGNOSIS — I639 Cerebral infarction, unspecified: Secondary | ICD-10-CM | POA: Insufficient documentation

## 2015-02-17 DIAGNOSIS — H53461 Homonymous bilateral field defects, right side: Secondary | ICD-10-CM | POA: Insufficient documentation

## 2015-02-17 HISTORY — DX: Cerebral infarction due to unspecified occlusion or stenosis of left posterior cerebral artery: I63.532

## 2015-02-18 ENCOUNTER — Telehealth: Payer: Self-pay | Admitting: Family Medicine

## 2015-02-18 NOTE — Telephone Encounter (Signed)
Saw him Friday or Thursday. Has a pretty severe R sided hemianopsia- doesn't think he will regain full sight. Likely will need disability.

## 2015-02-26 ENCOUNTER — Encounter: Payer: Self-pay | Admitting: Family Medicine

## 2015-02-26 ENCOUNTER — Ambulatory Visit (INDEPENDENT_AMBULATORY_CARE_PROVIDER_SITE_OTHER): Payer: Managed Care, Other (non HMO) | Admitting: Family Medicine

## 2015-02-26 VITALS — BP 114/75 | HR 93 | Temp 98.5°F | Wt 199.8 lb

## 2015-02-26 DIAGNOSIS — E1165 Type 2 diabetes mellitus with hyperglycemia: Secondary | ICD-10-CM | POA: Diagnosis not present

## 2015-02-26 DIAGNOSIS — H53131 Sudden visual loss, right eye: Secondary | ICD-10-CM | POA: Diagnosis not present

## 2015-02-26 DIAGNOSIS — E785 Hyperlipidemia, unspecified: Secondary | ICD-10-CM

## 2015-02-26 DIAGNOSIS — I10 Essential (primary) hypertension: Secondary | ICD-10-CM | POA: Diagnosis not present

## 2015-02-26 DIAGNOSIS — I6340 Cerebral infarction due to embolism of unspecified cerebral artery: Secondary | ICD-10-CM

## 2015-02-26 DIAGNOSIS — Z720 Tobacco use: Secondary | ICD-10-CM | POA: Diagnosis not present

## 2015-02-26 DIAGNOSIS — I639 Cerebral infarction, unspecified: Secondary | ICD-10-CM

## 2015-02-26 DIAGNOSIS — IMO0002 Reserved for concepts with insufficient information to code with codable children: Secondary | ICD-10-CM

## 2015-02-26 LAB — LIPID PANEL PICCOLO, WAIVED
Chol/HDL Ratio Piccolo,Waive: 3 mg/dL
Cholesterol Piccolo, Waived: 136 mg/dL (ref <200)
HDL CHOL PICCOLO, WAIVED: 46 mg/dL — AB (ref >59)
LDL Chol Calc Piccolo Waived: 49 mg/dL (ref <100)
TRIGLYCERIDES PICCOLO,WAIVED: 206 mg/dL — AB (ref <150)
VLDL Chol Calc Piccolo,Waive: 41 mg/dL — ABNORMAL HIGH (ref <30)

## 2015-02-26 LAB — ALT (SGPT) PICCOLO, WAIVED: ALT (SGPT) PICCOLO, WAIVED: 29 U/L (ref 10–47)

## 2015-02-26 LAB — AST (SGOT) PICCOLO, WAIVED: AST (SGOT) Piccolo, Waived: 26 U/L (ref 11–38)

## 2015-02-26 MED ORDER — NICOTINE 14 MG/24HR TD PT24: 14.0000 mg | MEDICATED_PATCH | Freq: Every day | TRANSDERMAL | Status: DC

## 2015-02-26 NOTE — Progress Notes (Signed)
BP 114/75 mmHg  Pulse 93  Temp(Src) 98.5 F (36.9 C)  Wt 199 lb 12.8 oz (90.629 kg)  SpO2 99%   Subjective:    Patient ID: [name], male    DOB: 1955/12/08, 59 y.o.   MRN: [id number]  HPI: Davinci Glotfelty is a 59 y.o. male  Chief Complaint  Patient presents with  . Nicotine Dependence  . Loss of Vision   Tobacco abuse- Has been using the Wellbutrin. Doesn't find that it helped. Has cut down on smoking, but finding it very difficult. Would like to switch to the patches instead of the Wellbutrin.   Has been following with Dr. Manuella Ghazi, who thinks that this is a stroke and not temporal arteritis, but we will recheck CRP and ESR today. Seeing cardiology regarding cause of stroke. Having a bunch of tests done. Holter on today.   HYPERLIPIDEMIA Hyperlipidemia status: Under good control Satisfied with current treatment?  yes Side effects:  no Medication compliance: excellent compliance Supplements: none Aspirin:  yes Chest pain:  no  Relevant past medical, surgical, family and social history reviewed and updated as indicated. Interim medical history since our last visit reviewed. Allergies and medications reviewed and updated.  Review of Systems  Constitutional: Negative.   Respiratory: Negative.   Cardiovascular: Negative.   Gastrointestinal: Negative.   Psychiatric/Behavioral: Negative.     Per HPI unless specifically indicated above     Objective:    BP 114/75 mmHg  Pulse 93  Temp(Src) 98.5 F (36.9 C)  Wt 199 lb 12.8 oz (90.629 kg)  SpO2 99%  Wt Readings from Last 3 Encounters:  02/26/15 199 lb 12.8 oz (90.629 kg)  01/24/15 200 lb 9.6 oz (90.992 kg)  01/21/15 202 lb (91.627 kg)    Physical Exam  Constitutional: He is oriented to person, place, and time. He appears well-developed and well-nourished. No distress.  HENT:  Head: Normocephalic and atraumatic.  Right Ear: Hearing normal.  Left Ear: Hearing normal.  Nose: Nose normal.  Eyes: Conjunctivae and lids  are normal. Right eye exhibits no discharge. Left eye exhibits no discharge. No scleral icterus.  Cardiovascular: Normal rate, regular rhythm and normal heart sounds.  Exam reveals no gallop and no friction rub.   No murmur heard. Pulmonary/Chest: Effort normal. No respiratory distress. He has no wheezes. He has no rales. He exhibits no tenderness.  Musculoskeletal: Normal range of motion.  Neurological: He is alert and oriented to person, place, and time.  Skin: Skin is intact. No rash noted.  Psychiatric: He has a normal mood and affect. His speech is normal and behavior is normal. Judgment and thought content normal. Cognition and memory are normal.    Results for orders placed or performed in visit on 02/26/15  ALT (SGPT) Piccolo, [employer]  Result Value Ref Range   ALT (SGPT) Piccolo, Waived 29 10 - 47 U/L  AST (SGOT) Piccolo, Waived  Result Value Ref Range   AST (SGOT) Piccolo, Waived 26 11 - 38 U/L  Lipid Panel Piccolo, Waived  Result Value Ref Range   Cholesterol Piccolo, Waived 136 <200 mg/dL   HDL Chol Piccolo, Waived 46 (L) >59 mg/dL   Triglycerides Piccolo,Waived 206 (H) <150 mg/dL   Chol/HDL Ratio Piccolo,Waive 3.0 mg/dL   LDL Chol Calc Piccolo Waived 49 <100 mg/dL   VLDL Chol Calc Piccolo,Waive 41 (H) <30 mg/dL      Assessment & Plan:   Problem List Items Addressed This Visit      Cardiovascular and Mediastinum  HTN (hypertension)    Under good control today. Continue current regimen. Continue to monitor. Continue to follow with cardiology.       Stroke, acute, embolic    Discussed that his vision may not come back. We will put him out of work for 6 months to see if vision returns. 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         Other   DM (diabetes mellitus), type 2, uncontrolled    Due for  recheck on A1c in 2 months. Continue current regimen until then.       Relevant Orders   Bayer DCA Hb A1c Waived   Hyperlipidemia - Primary    Under much better control on his current regimen. Continue current regimen. Continue to monitor.       Relevant Orders   ALT (SGPT) Piccolo, Waived (Completed)   AST (SGOT) Piccolo, Waived (Completed)   Lipid Panel Piccolo, [employer] (Completed)   Sudden loss of vision   Relevant Orders   Sed Rate (ESR)   C-reactive protein   Tobacco abuse    Will stop wellbutrin and start nicoderm cq. Continue to work on cutting down with the goal of quitting.       Relevant Medications   nicotine (NICODERM CQ) 14 mg/24hr patch       Follow up plan: Return in about 2 months (around 04/29/2015) for DM visit.

## 2015-02-26 NOTE — Assessment & Plan Note (Signed)
Will stop wellbutrin and start nicoderm cq. Continue to work on cutting down with the goal of quitting.

## 2015-02-26 NOTE — Assessment & Plan Note (Signed)
Due for recheck on A1c in 2 months. Continue current regimen until then.

## 2015-02-26 NOTE — Assessment & Plan Note (Signed)
Under good control today. Continue current regimen. Continue to monitor. Continue to follow with cardiology.

## 2015-02-26 NOTE — Assessment & Plan Note (Signed)
Discussed that his vision may not come back. We will put him out of work for 6 months to see if vision returns. 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

## 2015-02-26 NOTE — Assessment & Plan Note (Signed)
Under much better control on his current regimen. Continue current regimen. Continue to monitor.

## 2015-02-27 ENCOUNTER — Telehealth: Payer: Self-pay

## 2015-02-27 LAB — SEDIMENTATION RATE: SED RATE: 7 mm/h (ref 0–30)

## 2015-02-27 LAB — C-REACTIVE PROTEIN: CRP: 6.6 mg/L — ABNORMAL HIGH (ref 0.0–4.9)

## 2015-02-27 NOTE — Telephone Encounter (Signed)
LVM for patient's wife to return my call.

## 2015-02-28 NOTE — Telephone Encounter (Signed)
Perfect! Thanks for the [facility]

## 2015-02-28 NOTE — Telephone Encounter (Signed)
Patients wife called, she stated that he has 7 months short term disability, so they do not need to file for long term disability at this time, also if they stay with the short term than they can keep the insurance through [employer].

## 2015-03-13 ENCOUNTER — Ambulatory Visit: Payer: Managed Care, Other (non HMO) | Admitting: Neurology

## 2015-03-15 ENCOUNTER — Encounter: Payer: Self-pay | Admitting: Family Medicine

## 2015-03-15 ENCOUNTER — Ambulatory Visit (INDEPENDENT_AMBULATORY_CARE_PROVIDER_SITE_OTHER): Payer: Managed Care, Other (non HMO) | Admitting: Family Medicine

## 2015-03-15 VITALS — BP 128/81 | HR 97 | Temp 98.3°F | Wt 200.4 lb

## 2015-03-15 DIAGNOSIS — H53131 Sudden visual loss, right eye: Secondary | ICD-10-CM

## 2015-03-15 DIAGNOSIS — I10 Essential (primary) hypertension: Secondary | ICD-10-CM | POA: Diagnosis not present

## 2015-03-15 DIAGNOSIS — Z720 Tobacco use: Secondary | ICD-10-CM

## 2015-03-15 DIAGNOSIS — G44321 Chronic post-traumatic headache, intractable: Secondary | ICD-10-CM | POA: Diagnosis not present

## 2015-03-15 DIAGNOSIS — G8929 Other chronic pain: Secondary | ICD-10-CM | POA: Insufficient documentation

## 2015-03-15 DIAGNOSIS — R51 Headache: Secondary | ICD-10-CM

## 2015-03-15 MED ORDER — SIMVASTATIN 40 MG PO TABS: 40.0000 mg | ORAL_TABLET | Freq: Every day | ORAL | Status: DC

## 2015-03-15 MED ORDER — AMITRIPTYLINE HCL 25 MG PO TABS: 25.0000 mg | ORAL_TABLET | Freq: Every day | ORAL | Status: DC

## 2015-03-15 NOTE — Assessment & Plan Note (Signed)
Under good control. Continue current regimen. 

## 2015-03-15 NOTE — Progress Notes (Signed)
BP 128/81 mmHg  Pulse 97  Temp(Src) 98.3 F (36.8 C)  Wt 200 lb 6.4 oz (90.901 kg)  SpO2 98%   Subjective:    Patient ID: [name], male    DOB: [birth year], 59 y.o.   MRN: [id number]  HPI: [name] is a 59 y.o. male  Chief Complaint  Patient presents with  . Nicotine Dependence   Saw eye doctor and neurologist. They do not think his vision will return.  Saw cardiology. Having work up regarding etiology of stroke. Seeing them again on Monday.   SMOKING CESSATION Smoking: Still smoking Smoking Amount: 1/2-1 ppd Smoking Onset: Started when he was  Smoking Quit Date: Stopping on Sunday Smoking triggers:  Caffeine, drinking alcohol Type of tobacco use: cigarettes Children in the house: no Other household members who smoke: no Treatments attempted: none yet Pneumovax: up to date   Headache that he can't get rid of x 2 month, since he had his stroke Right in the middle of his head Alcohol makes him sleep, but doesn't make it better Nothing makes it better.  Has tried motrin and ibuprofen.  Nothing makes it better.   Had to stop the lipitor due to rash, has taken pravastatin before with out issues.  Relevant past medical, surgical, family and social history reviewed and updated as indicated. Interim medical history since our last visit reviewed. Allergies and medications reviewed and updated.  Review of Systems  Constitutional: Negative.   Respiratory: Negative.   Cardiovascular: Negative.   Neurological: Positive for headaches. Negative for dizziness, tremors, seizures, syncope, facial asymmetry, speech difficulty, weakness, light-headedness and numbness.  Psychiatric/Behavioral: Negative.    Per HPI unless specifically indicated above     Objective:    BP 128/81 mmHg  Pulse 97  Temp(Src) 98.3 F (36.8 C)  Wt 200 lb 6.4 oz (90.901 kg)  SpO2 98%  Wt Readings from Last 3 Encounters:  03/15/15 200 lb 6.4 oz (90.901 kg)  02/26/15 199 lb 12.8 oz (90.629 kg)   01/24/15 200 lb 9.6 oz (90.992 kg)    Physical Exam  Constitutional: He is oriented to person, place, and time. He appears well-developed and well-nourished. No distress.  HENT:  Head: Normocephalic and atraumatic.  Right Ear: Hearing normal.  Left Ear: Hearing normal.  Nose: Nose normal.  Eyes: Conjunctivae and lids are normal. Right eye exhibits no discharge. Left eye exhibits no discharge. No scleral icterus.  Cardiovascular: Normal rate, regular rhythm and normal heart sounds.  Exam reveals no gallop and no friction rub.   No murmur heard. Pulmonary/Chest: Effort normal and breath sounds normal. No respiratory distress. He has no wheezes. He has no rales. He exhibits no tenderness.  Musculoskeletal: Normal range of motion.  Neurological: He is alert and oriented to person, place, and time.  Skin: Skin is warm, dry and intact. No rash noted. No erythema. No pallor.  Psychiatric: He has a normal mood and affect. His speech is normal and behavior is normal. Judgment and thought content normal. Cognition and memory are normal.    Results for orders placed or performed in visit on 02/26/15  ALT (SGPT) Piccolo, Waived  Result Value Ref Range   ALT (SGPT) Piccolo, Waived 29 10 - 47 U/L  AST (SGOT) Piccolo, Waived  Result Value Ref Range   AST (SGOT) Piccolo, Waived 26 11 - 38 U/L  Lipid Panel Piccolo, Waived  Result Value Ref Range   Cholesterol Piccolo, Waived 136 <200 mg/dL   HDL Chol [place], [place]  46 (L) >59 mg/dL   Triglycerides Piccolo,Waived 206 (H) <150 mg/dL   Chol/HDL Ratio Piccolo,Waive 3.0 mg/dL   LDL Chol Calc Piccolo Waived 49 <100 mg/dL   VLDL Chol Calc Piccolo,Waive 41 (H) <30 mg/dL  Sed Rate (ESR)  Result Value Ref Range   Sed Rate 7 0 - 30 mm/hr  C-reactive protein  Result Value Ref Range   CRP 6.6 (H) 0.0 - 4.9 mg/L      Assessment & Plan:   Problem List Items Addressed This Visit      Cardiovascular and Mediastinum   HTN (hypertension)    Under good  control. Continue current regimen.       Relevant Medications   simvastatin (ZOCOR) 40 MG tablet     Other   Sudden loss of vision    Due to stroke. Likely not to return. Discussed applying for long term disability so there is not a gap between short-term and long term.       Tobacco abuse - Primary    Still smoking. Hasn't started the patches. Quit date for Sunday. Encouragement given.       Chronic headaches    Possibly due to his stroke. Will treat like migraine. Will start on amitriptyline to help with sleep. If not improving, consider other medication. He does not want any narcotics. Recheck in in 1 month at DM follow up.       Relevant Medications   amitriptyline (ELAVIL) 25 MG tablet       Follow up plan: Return for As scheduled.

## 2015-03-15 NOTE — Assessment & Plan Note (Signed)
Possibly due to his stroke. Will treat like migraine. Will start on amitriptyline to help with sleep. If not improving, consider other medication. He does not want any narcotics. Recheck in in 1 month at DM follow up.

## 2015-03-15 NOTE — Assessment & Plan Note (Signed)
Still smoking. Hasn't started the patches. Quit date for Sunday. Encouragement given.

## 2015-03-15 NOTE — Patient Instructions (Signed)
Smoking Cessation, Tips for Success  If you are ready to quit smoking, congratulations! You have chosen to help yourself be healthier. Cigarettes bring nicotine, tar, carbon monoxide, and other irritants into your body. Your lungs, heart, and blood vessels will be able to work better without these poisons. There are many different ways to quit smoking. Nicotine gum, nicotine patches, a nicotine inhaler, or nicotine nasal spray can help with physical craving. Hypnosis, support groups, and medicines help break the habit of smoking.  WHAT THINGS CAN I DO TO MAKE QUITTING EASIER?   Here are some tips to help you quit for good:  · Pick a date when you will quit smoking completely. Tell all of your friends and family about your plan to quit on that date.  · Do not try to slowly cut down on the number of cigarettes you are smoking. Pick a quit date and quit smoking completely starting on that day.  · Throw away all cigarettes.    · Clean and remove all ashtrays from your home, work, and car.  · On a card, write down your reasons for quitting. Carry the card with you and read it when you get the urge to smoke.  · Cleanse your body of nicotine. Drink enough water and fluids to keep your urine clear or pale yellow. Do this after quitting to flush the nicotine from your body.  · Learn to predict your moods. Do not let a bad situation be your excuse to have a cigarette. Some situations in your life might tempt you into wanting a cigarette.  · Never have "just one" cigarette. It leads to wanting another and another. Remind yourself of your decision to quit.  · Change habits associated with smoking. If you smoked while driving or when feeling stressed, try other activities to replace smoking. Stand up when drinking your coffee. Brush your teeth after eating. Sit in a different chair when you read the paper. Avoid alcohol while trying to quit, and try to drink fewer caffeinated beverages. Alcohol and caffeine may urge you to  smoke.  · Avoid foods and drinks that can trigger a desire to smoke, such as sugary or spicy foods and alcohol.  · Ask people who smoke not to smoke around you.  · Have something planned to do right after eating or having a cup of coffee. For example, plan to take a walk or exercise.  · Try a relaxation exercise to calm you down and decrease your stress. Remember, you may be tense and nervous for the first 2 weeks after you quit, but this will pass.  · Find new activities to keep your hands busy. Play with a pen, coin, or rubber band. Doodle or draw things on paper.  · Brush your teeth right after eating. This will help cut down on the craving for the taste of tobacco after meals. You can also try mouthwash.    · Use oral substitutes in place of cigarettes. Try using lemon drops, carrots, cinnamon sticks, or chewing gum. Keep them handy so they are available when you have the urge to smoke.  · When you have the urge to smoke, try deep breathing.  · Designate your home as a nonsmoking area.  · If you are a heavy smoker, ask your health care provider about a prescription for nicotine chewing gum. It can ease your withdrawal from nicotine.  · Reward yourself. Set aside the cigarette money you save and buy yourself something nice.  · Look for   support from others. Join a support group or smoking cessation program. Ask someone at home or at work to help you with your plan to quit smoking.  · Always ask yourself, "Do I need this cigarette or is this just a reflex?" Tell yourself, "Today, I choose not to smoke," or "I do not want to smoke." You are reminding yourself of your decision to quit.  · Do not replace cigarette smoking with electronic cigarettes (commonly called e-cigarettes). The safety of e-cigarettes is unknown, and some may contain harmful chemicals.  · If you relapse, do not give up! Plan ahead and think about what you will do the next time you get the urge to smoke.  HOW WILL I FEEL WHEN I QUIT SMOKING?  You  may have symptoms of withdrawal because your body is used to nicotine (the addictive substance in cigarettes). You may crave cigarettes, be irritable, feel very hungry, cough often, get headaches, or have difficulty concentrating. The withdrawal symptoms are only temporary. They are strongest when you first quit but will go away within 10-14 days. When withdrawal symptoms occur, stay in control. Think about your reasons for quitting. Remind yourself that these are signs that your body is healing and getting used to being without cigarettes. Remember that withdrawal symptoms are easier to treat than the major diseases that smoking can cause.   Even after the withdrawal is over, expect periodic urges to smoke. However, these cravings are generally short lived and will go away whether you smoke or not. Do not smoke!  WHAT RESOURCES ARE AVAILABLE TO HELP ME QUIT SMOKING?  Your health care provider can direct you to community resources or hospitals for support, which may include:  · Group support.  · Education.  · Hypnosis.  · Therapy.  Document Released: 04/17/2004 Document Revised: 12/04/2013 Document Reviewed: 01/05/2013  ExitCare® Patient Information ©2015 [employer]. This information is not intended to replace advice given to you by your health care provider. Make sure you discuss any questions you have with your health care provider.

## 2015-03-15 NOTE — Assessment & Plan Note (Signed)
Due to stroke. Likely not to return. Discussed applying for long term disability so there is not a gap between short-term and long term.

## 2015-03-18 MED ORDER — SIMVASTATIN 40 MG PO TABS: 40.0000 mg | ORAL_TABLET | Freq: Every day | ORAL | Status: DC

## 2015-03-18 MED ORDER — AMITRIPTYLINE HCL 25 MG PO TABS: 25.0000 mg | ORAL_TABLET | Freq: Every day | ORAL | Status: DC

## 2015-03-18 NOTE — Addendum Note (Signed)
Addended by: [name] on: 03/18/2015 08:51 AM   Modules accepted: Orders

## 2015-03-18 NOTE — Addendum Note (Signed)
Addended by: [name] on: 03/18/2015 08:52 AM   Modules accepted: Orders

## 2015-03-25 ENCOUNTER — Telehealth: Payer: Self-pay

## 2015-03-25 MED ORDER — NICOTINE 21 MG/24HR TD PT24: 21.0000 mg | MEDICATED_PATCH | Freq: Every day | TRANSDERMAL | Status: DC

## 2015-03-25 NOTE — Telephone Encounter (Signed)
Patients wife called, patient has started the Nicotine patches, they are not strong enough. He would like the stronger one. He smokes a pack a day. [facility]

## 2015-03-25 NOTE — Telephone Encounter (Signed)
Rx sent to his pharmacy. Will step down to 14mg  after 1 month.

## 2015-04-01 ENCOUNTER — Ambulatory Visit (INDEPENDENT_AMBULATORY_CARE_PROVIDER_SITE_OTHER): Payer: Managed Care, Other (non HMO) | Admitting: Family Medicine

## 2015-04-01 ENCOUNTER — Encounter: Payer: Self-pay | Admitting: Family Medicine

## 2015-04-01 VITALS — BP 152/92 | HR 95 | Temp 97.8°F | Wt 200.1 lb

## 2015-04-01 DIAGNOSIS — I6930 Unspecified sequelae of cerebral infarction: Secondary | ICD-10-CM

## 2015-04-01 DIAGNOSIS — I639 Cerebral infarction, unspecified: Secondary | ICD-10-CM

## 2015-04-01 DIAGNOSIS — E1165 Type 2 diabetes mellitus with hyperglycemia: Secondary | ICD-10-CM | POA: Diagnosis not present

## 2015-04-01 DIAGNOSIS — R51 Headache: Secondary | ICD-10-CM

## 2015-04-01 DIAGNOSIS — G44321 Chronic post-traumatic headache, intractable: Secondary | ICD-10-CM | POA: Diagnosis not present

## 2015-04-01 DIAGNOSIS — I6340 Cerebral infarction due to embolism of unspecified cerebral artery: Secondary | ICD-10-CM

## 2015-04-01 DIAGNOSIS — R519 Headache, unspecified: Secondary | ICD-10-CM

## 2015-04-01 DIAGNOSIS — IMO0002 Reserved for concepts with insufficient information to code with codable children: Secondary | ICD-10-CM

## 2015-04-01 DIAGNOSIS — I10 Essential (primary) hypertension: Secondary | ICD-10-CM

## 2015-04-01 MED ORDER — TRAMADOL HCL 50 MG PO TABS: 50.0000 mg | ORAL_TABLET | Freq: Three times a day (TID) | ORAL | Status: DC | PRN

## 2015-04-01 NOTE — Assessment & Plan Note (Signed)
Does not seem to be improving. Continue to follow with specialists. Continue to monitor.

## 2015-04-01 NOTE — Assessment & Plan Note (Signed)
Strongly recommended to patient that he decrease sugar intake. Due for repeat A1c next visit. Likely the cause of the parethesias. Monitor more closely.

## 2015-04-01 NOTE — Assessment & Plan Note (Signed)
Not seeming to be recovering. Form filled out for work. Continue to monitor.

## 2015-04-01 NOTE — Progress Notes (Signed)
BP 152/92 mmHg  Pulse 95  Temp(Src) 97.8 F (36.6 C)  Wt 200 lb 2 oz (90.776 kg)  SpO2 97%   Subjective:    Patient ID: [name], male    DOB: 1956/03/06, 59 y.o.   MRN: [id number]  HPI: Larry Alcock is a 59 y.o. male  Chief Complaint  Patient presents with  . Headache  . Numbness    top of right foot and  numbness in toes   Did not take his blood pressure medicine today. Has been trying to not use the pill box for it and has been having a lot of trouble remembering things. He is frustrated because all of his specialists note that he doesn't seem to be getting better, and he doesn't think he is going to. He is not happy that he will likely not be able to go back to work.   MIGRAINES- come and go. Have been happening since his stroke. Tried the amitriptyline to see if that will help, but doesn't seem to be helping with his headaches at all. Has been helping with the sleep a little bit, but hasn't been helping for more than a couple of hours. Still having trouble switching off of 3rd shift.  Duration: months Onset: sudden Severity: moderate Quality: sharp and aching Frequency: intermittent Location: R sided Headache duration: hours Radiation: no Time of day headache occurs: varies Alleviating factors: nothing Aggravating factors: nothing Headache status at time of visit: current headache Treatments attempted: Treatments attempted: rest, ice, heat, APAP, ibuprofen and amitriptyline   Aura: no Nausea:  no Vomiting: no Photophobia:  yes Phonophobia:  yes Effect on social functioning:  yes Fevers:  no  Numbness in R foot, just at his toes, comes and goes, has been eating a lot more sugar recently. Hasn't been watching as closely and has been snacking on a lot of sweets.   Relevant past medical, surgical, family and social history reviewed and updated as indicated. Interim medical history since our last visit reviewed. Allergies and medications reviewed and updated.  Review  of Systems  Constitutional: Negative.   Respiratory: Negative.   Cardiovascular: Negative.   Gastrointestinal: Negative.   Musculoskeletal: Negative.   Psychiatric/Behavioral: Negative.     Per HPI unless specifically indicated above     Objective:    BP 152/92 mmHg  Pulse 95  Temp(Src) 97.8 F (36.6 C)  Wt 200 lb 2 oz (90.776 kg)  SpO2 97%  Wt Readings from Last 3 Encounters:  04/01/15 200 lb 2 oz (90.776 kg)  03/15/15 200 lb 6.4 oz (90.901 kg)  02/26/15 199 lb 12.8 oz (90.629 kg)    Physical Exam  Constitutional: He is oriented to person, place, and time. He appears well-developed and well-nourished. No distress.  HENT:  Head: Normocephalic and atraumatic.  Right Ear: Hearing normal.  Left Ear: Hearing normal.  Nose: Nose normal.  Eyes: Conjunctivae and lids are normal. Right eye exhibits no discharge. Left eye exhibits no discharge. No scleral icterus.  Cardiovascular: Normal rate, regular rhythm, normal heart sounds and intact distal pulses.  Exam reveals no gallop and no friction rub.   No murmur heard. Pulmonary/Chest: Effort normal and breath sounds normal. No respiratory distress. He has no wheezes. He has no rales. He exhibits no tenderness.  Musculoskeletal: Normal range of motion.  Neurological: He is alert and oriented to person, place, and time.  Visual loss on the R. Memory deficit.   Skin: Skin is warm, dry and intact. No rash noted.  No erythema. No pallor.  Psychiatric: He has a normal mood and affect. His speech is normal and behavior is normal. Judgment and thought content normal. Cognition and memory are normal.  Nursing note and vitals reviewed.   Results for orders placed or performed in visit on 02/26/15  ALT (SGPT) Piccolo, Waived  Result Value Ref Range   ALT (SGPT) Piccolo, Waived 29 10 - 47 U/L  AST (SGOT) Piccolo, Waived  Result Value Ref Range   AST (SGOT) Piccolo, Waived 26 11 - 38 U/L  Lipid Panel Piccolo, Waived  Result Value Ref  Range   Cholesterol Piccolo, Waived 136 <200 mg/dL   HDL Chol Piccolo, Waived 46 (L) >59 mg/dL   Triglycerides Piccolo,Waived 206 (H) <150 mg/dL   Chol/HDL Ratio Piccolo,Waive 3.0 mg/dL   LDL Chol Calc Piccolo Waived 49 <100 mg/dL   VLDL Chol Calc Piccolo,Waive 41 (H) <30 mg/dL  Sed Rate (ESR)  Result Value Ref Range   Sed Rate 7 0 - 30 mm/hr  C-reactive protein  Result Value Ref Range   CRP 6.6 (H) 0.0 - 4.9 mg/L      Assessment & Plan:   Problem List Items Addressed This Visit      Cardiovascular and Mediastinum   HTN (hypertension)    Elevated today due to not taking his medicine. Will start using box with just BID pills to simplify.      Stroke, acute, embolic    Does not seem to be improving. Continue to follow with specialists. Continue to monitor.         Other   DM (diabetes mellitus), type 2, uncontrolled    Strongly recommended to patient that he decrease sugar intake. Due for repeat A1c next visit. Likely the cause of the parethesias. Monitor more closely.       Chronic headaches    Not improving on amitriptyline. Will try tramadol for PRN use, but he really doesn't want to take anything habit forming. Will see if Dr. Manuella Ghazi has any recommendations on medication for post-stroke headache for better prophylaxis. We will see if he can get in sooner with neuro. Await recommendations. Tramadol for now.       Relevant Medications   traMADol (ULTRAM) 50 MG tablet   History of stroke with residual deficit - Primary    Not seeming to be recovering. Form filled out for work. Continue to monitor.        Other Visit Diagnoses    Persistent headaches        Relevant Medications    traMADol (ULTRAM) 50 MG tablet        Follow up plan: Return in about 4 weeks (around 04/29/2015).

## 2015-04-01 NOTE — Assessment & Plan Note (Signed)
Not improving on amitriptyline. Will try tramadol for PRN use, but he really doesn't want to take anything habit forming. Will see if Dr. Manuella Ghazi has any recommendations on medication for post-stroke headache for better prophylaxis. We will see if he can get in sooner with neuro. Await recommendations. Tramadol for now.

## 2015-04-01 NOTE — Assessment & Plan Note (Signed)
Elevated today due to not taking his medicine. Will start using box with just BID pills to simplify.

## 2015-04-01 NOTE — Patient Instructions (Signed)
Insomnia Insomnia is frequent trouble falling and/or staying asleep. Insomnia can be a long term problem or a short term problem. Both are common. Insomnia can be a short term problem when the wakefulness is related to a certain stress or worry. Long term insomnia is often related to ongoing stress during waking hours and/or poor sleeping habits. Overtime, sleep deprivation itself can make the problem worse. Every little thing feels more severe because you are overtired and your ability to cope is decreased. CAUSES   Stress, anxiety, and depression.  Poor sleeping habits.  Distractions such as TV in the bedroom.  Naps close to bedtime.  Engaging in emotionally charged conversations before bed.  Technical reading before sleep.  Alcohol and other sedatives. They may make the problem worse. They can hurt normal sleep patterns and normal dream activity.  Stimulants such as caffeine for several hours prior to bedtime.  Pain syndromes and shortness of breath can cause insomnia.  Exercise late at night.  Changing time zones may cause sleeping problems (jet lag). It is sometimes helpful to have someone observe your sleeping patterns. They should look for periods of not breathing during the night (sleep apnea). They should also look to see how long those periods last. If you live alone or observers are uncertain, you can also be observed at a sleep clinic where your sleep patterns will be professionally monitored. Sleep apnea requires a checkup and treatment. Give your caregivers your medical history. Give your caregivers observations your family has made about your sleep.  SYMPTOMS   Not feeling rested in the morning.  Anxiety and restlessness at bedtime.  Difficulty falling and staying asleep. TREATMENT   Your caregiver may prescribe treatment for an underlying medical disorders. Your caregiver can give advice or help if you are using alcohol or other drugs for self-medication. Treatment  of underlying problems will usually eliminate insomnia problems.  Medications can be prescribed for short time use. They are generally not recommended for lengthy use.  Over-the-counter sleep medicines are not recommended for lengthy use. They can be habit forming.  You can promote easier sleeping by making lifestyle changes such as:  Using relaxation techniques that help with breathing and reduce muscle tension.  Exercising earlier in the day.  Changing your diet and the time of your last meal. No night time snacks.  Establish a regular time to go to bed.  Counseling can help with stressful problems and worry.  Soothing music and white noise may be helpful if there are background noises you cannot remove.  Stop tedious detailed work at least one hour before bedtime. HOME CARE INSTRUCTIONS   Keep a diary. Inform your caregiver about your progress. This includes any medication side effects. See your caregiver regularly. Take note of:  Times when you are asleep.  Times when you are awake during the night.  The quality of your sleep.  How you feel the next day. This information will help your caregiver care for you.  Get out of bed if you are still awake after 15 minutes. Read or do some quiet activity. Keep the lights down. Wait until you feel sleepy and go back to bed.  Keep regular sleeping and waking hours. Avoid naps.  Exercise regularly.  Avoid distractions at bedtime. Distractions include watching television or engaging in any intense or detailed activity like attempting to balance the household checkbook.  Develop a bedtime ritual. Keep a familiar routine of bathing, brushing your teeth, climbing into bed at the same   time each night, listening to soothing music. Routines increase the success of falling to sleep faster.  Use relaxation techniques. This can be using breathing and muscle tension release routines. It can also include visualizing peaceful scenes. You can  also help control troubling or intruding thoughts by keeping your mind occupied with boring or repetitive thoughts like the old concept of counting sheep. You can make it more creative like imagining planting one beautiful flower after another in your backyard garden.  During your day, work to eliminate stress. When this is not possible use some of the previous suggestions to help reduce the anxiety that accompanies stressful situations. MAKE SURE YOU:   Understand these instructions.  Will watch your condition.  Will get help right away if you are not doing well or get worse. Document Released: 07/17/2000 Document Revised: 10/12/2011 Document Reviewed: 08/17/2007 Exit[employer] Patient Information 2015 [employer]. This information is not intended to replace advice given to you by your health care provider. Make sure you discuss any questions you have with your health care provider.  

## 2015-04-29 ENCOUNTER — Encounter: Payer: Self-pay | Admitting: Family Medicine

## 2015-04-29 ENCOUNTER — Emergency Department: Payer: Managed Care, Other (non HMO)

## 2015-04-29 ENCOUNTER — Emergency Department
Admission: EM | Admit: 2015-04-29 | Discharge: 2015-04-29 | Disposition: A | Discharge status: Home / Self Care | Payer: Managed Care, Other (non HMO) | Attending: Emergency Medicine | Admitting: Emergency Medicine

## 2015-04-29 ENCOUNTER — Other Ambulatory Visit: Payer: Self-pay | Admitting: Family Medicine

## 2015-04-29 ENCOUNTER — Other Ambulatory Visit: Payer: Self-pay

## 2015-04-29 ENCOUNTER — Ambulatory Visit (INDEPENDENT_AMBULATORY_CARE_PROVIDER_SITE_OTHER): Payer: Managed Care, Other (non HMO) | Admitting: Family Medicine

## 2015-04-29 VITALS — BP 149/74 | HR 95 | Temp 98.1°F | Ht 69.0 in | Wt 200.0 lb

## 2015-04-29 DIAGNOSIS — I6340 Cerebral infarction due to embolism of unspecified cerebral artery: Secondary | ICD-10-CM

## 2015-04-29 DIAGNOSIS — G44321 Chronic post-traumatic headache, intractable: Secondary | ICD-10-CM | POA: Diagnosis not present

## 2015-04-29 DIAGNOSIS — I639 Cerebral infarction, unspecified: Secondary | ICD-10-CM

## 2015-04-29 DIAGNOSIS — Z79899 Other long term (current) drug therapy: Secondary | ICD-10-CM | POA: Insufficient documentation

## 2015-04-29 DIAGNOSIS — E1165 Type 2 diabetes mellitus with hyperglycemia: Secondary | ICD-10-CM

## 2015-04-29 DIAGNOSIS — I6930 Unspecified sequelae of cerebral infarction: Secondary | ICD-10-CM | POA: Diagnosis not present

## 2015-04-29 DIAGNOSIS — Z720 Tobacco use: Secondary | ICD-10-CM | POA: Diagnosis not present

## 2015-04-29 DIAGNOSIS — Z7982 Long term (current) use of aspirin: Secondary | ICD-10-CM | POA: Diagnosis not present

## 2015-04-29 DIAGNOSIS — I10 Essential (primary) hypertension: Secondary | ICD-10-CM | POA: Diagnosis not present

## 2015-04-29 DIAGNOSIS — IMO0002 Reserved for concepts with insufficient information to code with codable children: Secondary | ICD-10-CM

## 2015-04-29 DIAGNOSIS — R51 Headache: Secondary | ICD-10-CM | POA: Insufficient documentation

## 2015-04-29 DIAGNOSIS — R519 Headache, unspecified: Secondary | ICD-10-CM

## 2015-04-29 DIAGNOSIS — E119 Type 2 diabetes mellitus without complications: Secondary | ICD-10-CM | POA: Insufficient documentation

## 2015-04-29 DIAGNOSIS — R531 Weakness: Secondary | ICD-10-CM | POA: Diagnosis present

## 2015-04-29 HISTORY — DX: Cerebral infarction, unspecified: I63.9

## 2015-04-29 LAB — CBC
HEMATOCRIT: 43.1 % (ref 40.0–52.0)
HEMOGLOBIN: 15 g/dL (ref 13.0–18.0)
MCH: 30.8 pg (ref 26.0–34.0)
MCHC: 34.8 g/dL (ref 32.0–36.0)
MCV: 88.6 fL (ref 80.0–100.0)
Platelets: 271 10*3/uL (ref 150–440)
RBC: 4.87 MIL/uL (ref 4.40–5.90)
RDW: 13.2 % (ref 11.5–14.5)
WBC: 9.8 10*3/uL (ref 3.8–10.6)

## 2015-04-29 LAB — URINALYSIS COMPLETE WITH MICROSCOPIC (ARMC ONLY)
BACTERIA UA: NONE SEEN
Bilirubin Urine: NEGATIVE
GLUCOSE, UA: 50 mg/dL — AB
Hgb urine dipstick: NEGATIVE
Ketones, ur: NEGATIVE mg/dL
Leukocytes, UA: NEGATIVE
Nitrite: NEGATIVE
PROTEIN: NEGATIVE mg/dL
SPECIFIC GRAVITY, URINE: 1.01 (ref 1.005–1.030)
SQUAMOUS EPITHELIAL / LPF: NONE SEEN
pH: 6 (ref 5.0–8.0)

## 2015-04-29 LAB — BASIC METABOLIC PANEL
ANION GAP: 9 (ref 5–15)
BUN: 10 mg/dL (ref 6–20)
CALCIUM: 9.9 mg/dL (ref 8.9–10.3)
CHLORIDE: 99 mmol/L — AB (ref 101–111)
CO2: 27 mmol/L (ref 22–32)
Creatinine, Ser: 0.75 mg/dL (ref 0.61–1.24)
GFR calc non Af Amer: >60 mL/min (ref >60)
GLUCOSE: 287 mg/dL — AB (ref 65–99)
Potassium: 4.1 mmol/L (ref 3.5–5.1)
Sodium: 135 mmol/L (ref 135–145)

## 2015-04-29 LAB — SEDIMENTATION RATE: SED RATE: 28 mm/h — AB (ref 0–20)

## 2015-04-29 LAB — GLUCOSE, CAPILLARY: Glucose-Capillary: 268 mg/dL — ABNORMAL HIGH (ref 65–99)

## 2015-04-29 IMAGING — CT CT HEAD W/O CM
1 series · 16 of 30 positions shown, 20 images · non-contrast
Comparison: 01/24/2015.

CLINICAL DATA: RIGHT-sided visual changes. Headache. Symptoms for 4
days.

EXAM:
CT HEAD WITHOUT CONTRAST
TECHNIQUE: Contiguous axial images were obtained from the base of the skull
through the vertex without intravenous contrast.

[Series 2: head wo · axial · 0.43mm/px · z∈[-179,-35]mm · 16 of 36 slices shown, 20 images]
[im 2/36  brain]
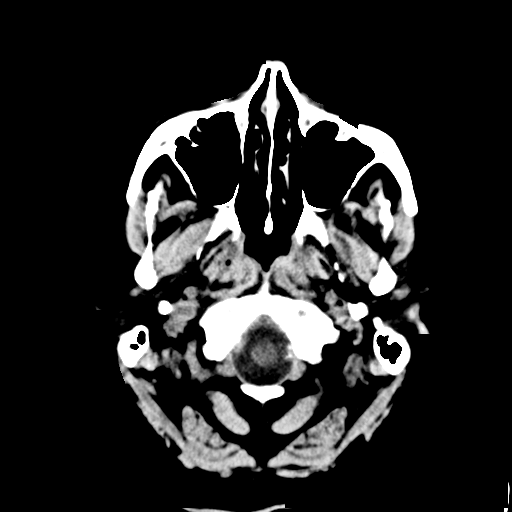
[im 2/36  bone]
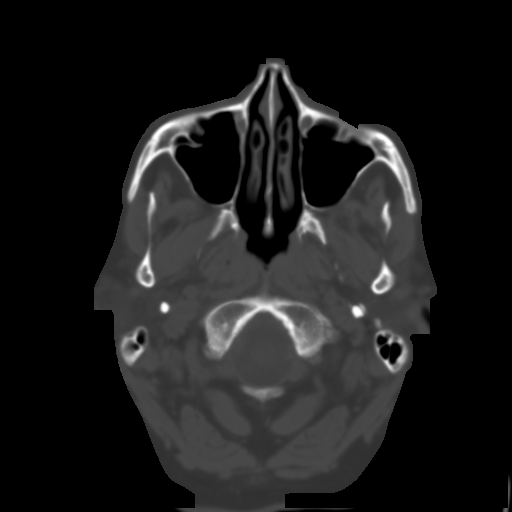
[im 4/36  brain]
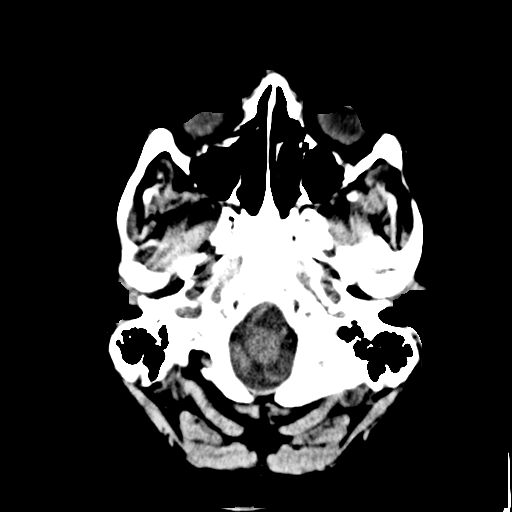
[im 7/36  brain]
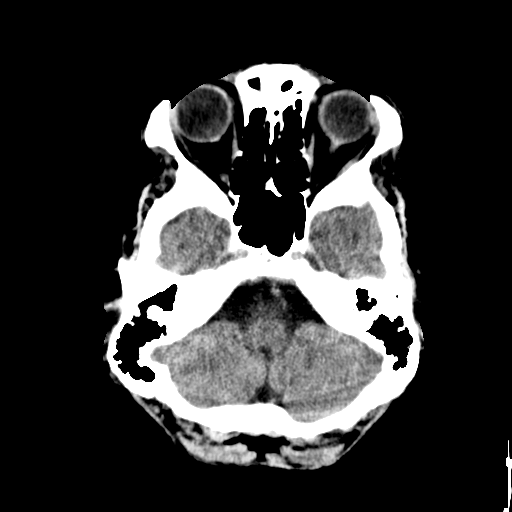
[im 9/36  brain]
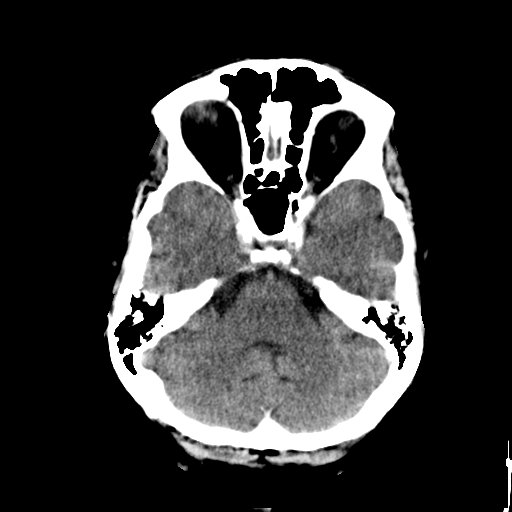
[im 10/36  brain]
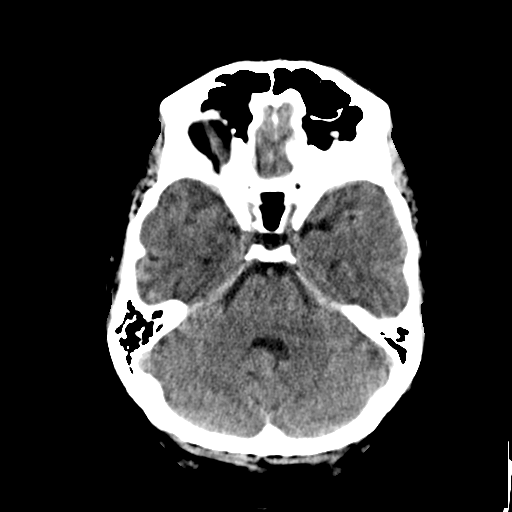
[im 10/36  bone]
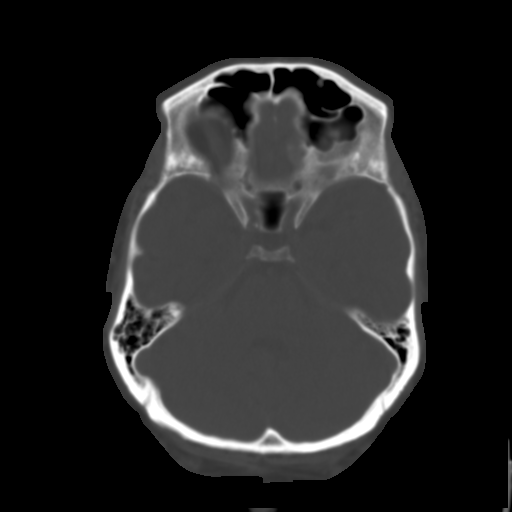
[im 13/36  brain]
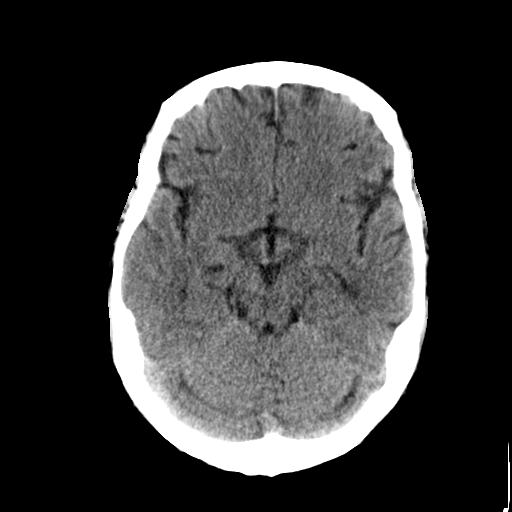
[im 15/36  brain]
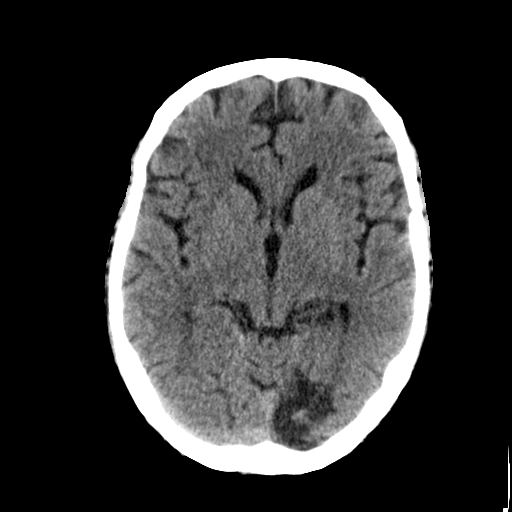
[im 17/36  brain]
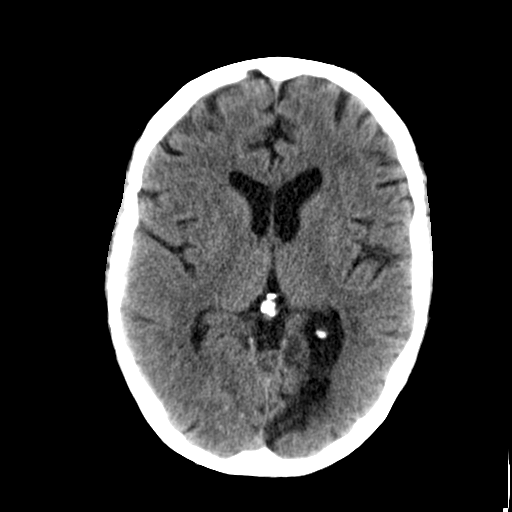
[im 19/36  brain]
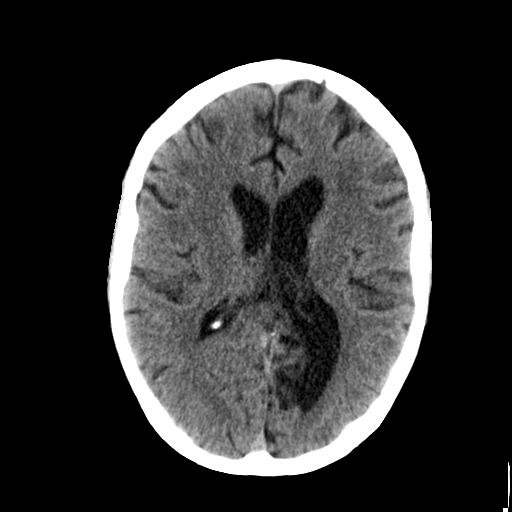
[im 19/36  bone]
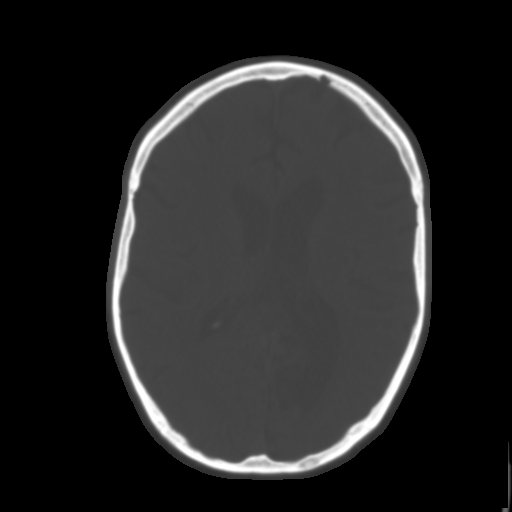
[im 21/36  brain]
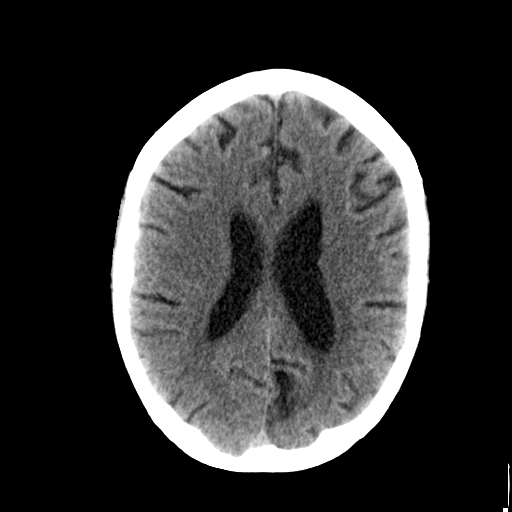
[im 23/36  brain]
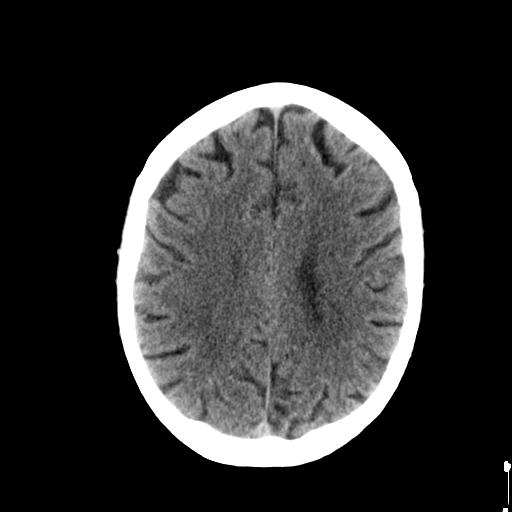
[im 26/36  brain]
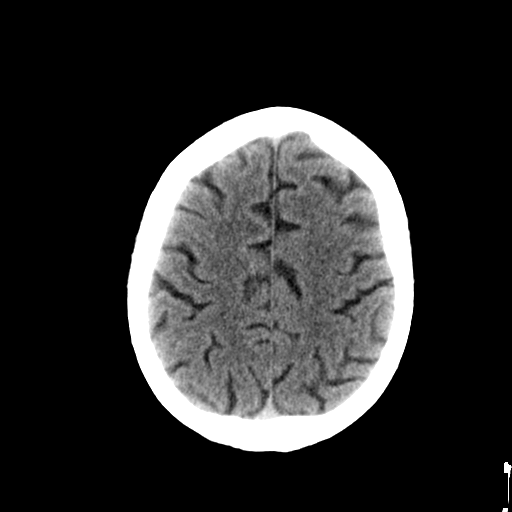
[im 27/36  brain]
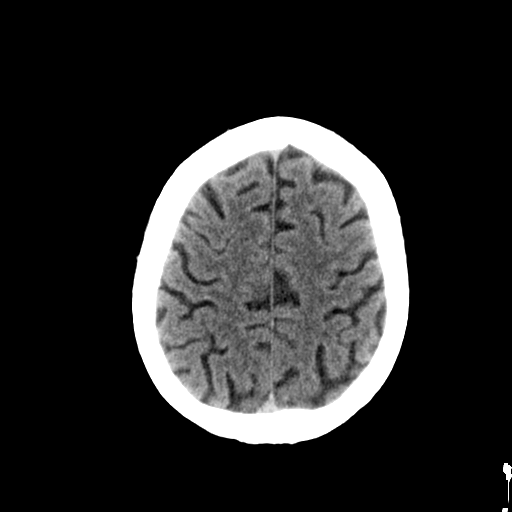
[im 27/36  bone]
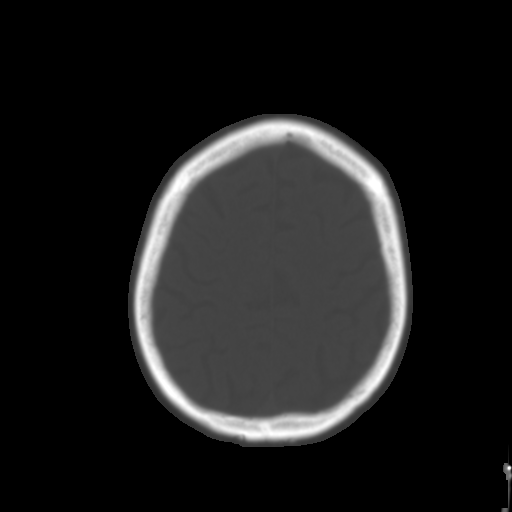
[im 29/36  brain]
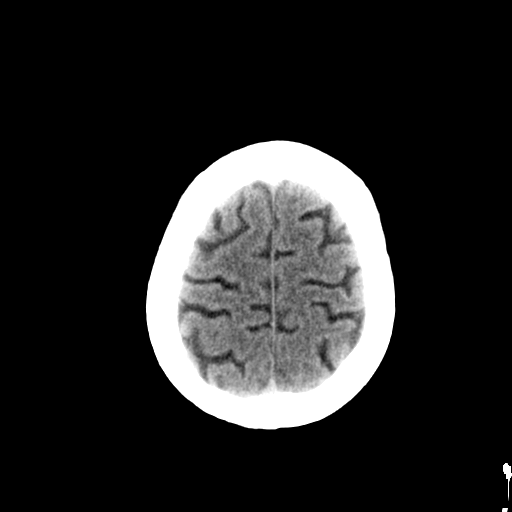
[im 32/36  brain]
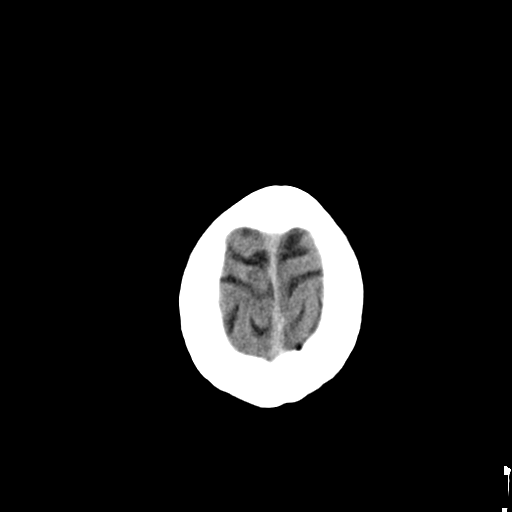
[im 34/36  brain]
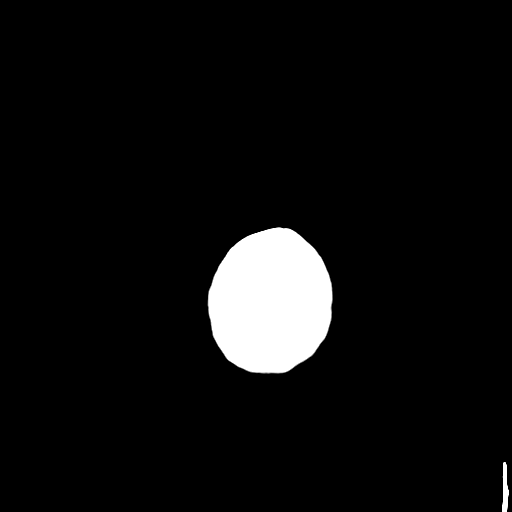

[16 of 30 positions shown; findings below may reference images not displayed]

FINDINGS: No mass lesion, mass effect, midline shift, hydrocephalus, or acute
hemorrhage. Encephalomalacia in the LEFT occipital lobe is chronic
and unchanged compared to recent head CT 01/24/2015.

The LEFT basal ganglia lacunar infarct identified on prior exam
01/24/2015 is no longer visible, probably due to partial volume
averaging. Ex vacuo dilation of the LEFT lateral ventricle.

Unchanged mild atrophy.
IMPRESSION: 1. No acute intracranial abnormality.
2. Old LEFT occipital infarct with encephalomalacia.
3. LEFT basal ganglia lacunar infarct no longer visible, likely due
to partial volume averaging.

## 2015-04-29 MED ORDER — LISINOPRIL-HYDROCHLOROTHIAZIDE 10-12.5 MG PO TABS: 1.0000 | ORAL_TABLET | Freq: Every day | ORAL | Status: DC

## 2015-04-29 MED ORDER — TRAMADOL HCL 50 MG PO TABS: 100.0000 mg | ORAL_TABLET | Freq: Four times a day (QID) | ORAL | Status: DC | PRN

## 2015-04-29 MED ORDER — HYDROMORPHONE HCL 2 MG PO TABS: 2.0000 mg | ORAL_TABLET | Freq: Two times a day (BID) | ORAL | Status: DC | PRN

## 2015-04-29 MED ORDER — METFORMIN HCL ER (OSM) 1000 MG PO TB24: 1000.0000 mg | ORAL_TABLET | Freq: Two times a day (BID) | ORAL | Status: DC

## 2015-04-29 MED ORDER — TRAMADOL HCL 50 MG PO TABS
50.0000 mg | ORAL_TABLET | Freq: Once | ORAL | Status: AC
  Administered 2015-04-29: 50 mg via ORAL
  Filled 2015-04-29: qty 1

## 2015-04-29 NOTE — ED Notes (Signed)
Patient with history of CVA 01/2015.  Patient went to Dr. Seymour Bars office for follow up and was sent to ED due to worsening eye sight to right eye and complaints of numbness to right leg.  Patient reports he noticed vision changes 3 day ago.

## 2015-04-29 NOTE — Progress Notes (Addendum)
BP 149/74 mmHg  Pulse 95  Temp(Src) 98.1 F (36.7 C)  Ht _0  (1.753 m)  Wt 200 lb (90.719 kg)  BMI 29.52 kg/m2  SpO2 99%   Subjective:    Patient ID: [name], male    DOB: Apr 01, 1956, 59 y.o.   MRN: [id number]  HPI: [name] is a 59 y.o. male  Chief Complaint  Patient presents with  . Diabetes   Vision getting worse over the past couple of days. Hasn't seen his eye doctor yet. To see him tomorrow. Doesn't think that he has another stroke, or if he did, it happened in his sleep. Notes that this has been going on for a couple of days.  Notes that he has been taking more aspirin than he should have been taking recently.   HYPERTENSION / HYPERLIPIDEMIA Satisfied with current treatment? yes Duration of hypertension: chronic BP monitoring frequency: not checking BP medication side effects: no Duration of hyperlipidemia: chronic Cholesterol medication side effects: no Cholesterol supplements: none Medication compliance: excellent compliance Aspirin: yes Recent stressors: no Recurrent headaches: yes Visual changes: yes Palpitations: yes Dyspnea: no Chest pain: no Lower extremity edema: no Dizzy/lightheaded: yes  DIABETES- had been eating a lot of candy Hypoglycemic episodes:no Polydipsia/polyuria: yes Visual disturbance: yes Chest pain: no Paresthesias: yes Glucose Monitoring: yes  Accucheck frequency: every 3-4 day Taking Insulin?: no Blood Pressure Monitoring: not checking Retinal Examination: Up to Date Foot Exam: Up to Date Diabetic Education: Completed Pneumovax: Up to Date Influenza: Up to Date Aspirin: yes  Tramadol didn't help with the headache. Still taking 6-8 a day  Relevant past medical, surgical, family and social history reviewed and updated as indicated. Interim medical history since our last visit reviewed. Allergies and medications reviewed and updated.  Review of Systems  Constitutional: Negative.   HENT: Negative.   Eyes: Positive  for visual disturbance. Negative for photophobia, pain, discharge, redness and itching.  Respiratory: Negative.   Cardiovascular: Negative.   Gastrointestinal: Negative.   Musculoskeletal: Negative.   Psychiatric/Behavioral: Negative.    Per HPI unless specifically indicated above    Objective:    BP 149/74 mmHg  Pulse 95  Temp(Src) 98.1 F (36.7 C)  Ht _1  (1.753 m)  Wt 200 lb (90.719 kg)  BMI 29.52 kg/m2  SpO2 99%  Wt Readings from Last 3 Encounters:  04/29/15 200 lb (90.719 kg)  04/01/15 200 lb 2 oz (90.776 kg)  03/15/15 200 lb 6.4 oz (90.901 kg)    Physical Exam  Constitutional: He is oriented to person, place, and time. He appears well-developed and well-nourished. No distress.  HENT:  Head: Normocephalic and atraumatic.  Right Ear: Hearing normal.  Left Ear: Hearing normal.  Nose: Nose normal.  Eyes: Conjunctivae, EOM and lids are normal. Pupils are equal, round, and reactive to light. Right eye exhibits no discharge. Left eye exhibits no discharge. No scleral icterus.  Vision decreased to more than 1/2 R eye which was previously  Neck: Normal range of motion. Neck supple. No JVD present. No tracheal deviation present. No thyromegaly present.  Cardiovascular: Normal rate, regular rhythm, normal heart sounds and intact distal pulses.  Exam reveals no gallop and no friction rub.   No murmur heard. Pulmonary/Chest: Effort normal and breath sounds normal. No stridor. No respiratory distress. He has no wheezes. He has no rales. He exhibits no tenderness.  Musculoskeletal: Normal range of motion.  Lymphadenopathy:    He has no cervical adenopathy.  Neurological: He is alert and oriented  to person, place, and time. He has normal strength and normal reflexes. He displays no atrophy, no tremor and normal reflexes. A sensory deficit is present. No cranial nerve deficit. He exhibits normal muscle tone. He displays a negative Romberg sign. He displays no seizure activity.  Coordination and gait normal.  Normal pronator drift Normal rapid alternating movement R side decreased sensation on all dermatomes to light touch  Skin: Skin is warm, dry and intact. No rash noted. No erythema. No pallor.  Psychiatric: He has a normal mood and affect. His speech is normal and behavior is normal. Judgment and thought content normal. Cognition and memory are normal.  Nursing note and vitals reviewed.   Results for orders placed or performed in visit on 02/26/15  ALT (SGPT) Piccolo, Waived  Result Value Ref Range   ALT (SGPT) Piccolo, Waived 29 10 - 47 U/L  AST (SGOT) Piccolo, Waived  Result Value Ref Range   AST (SGOT) Piccolo, Waived 26 11 - 38 U/L  Lipid Panel Piccolo, Waived  Result Value Ref Range   Cholesterol Piccolo, Waived 136 <200 mg/dL   HDL Chol Piccolo, Waived 46 (L) >59 mg/dL   Triglycerides Piccolo,Waived 206 (H) <150 mg/dL   Chol/HDL Ratio Piccolo,Waive 3.0 mg/dL   LDL Chol Calc Piccolo Waived 49 <100 mg/dL   VLDL Chol Calc Piccolo,Waive 41 (H) <30 mg/dL  Sed Rate (ESR)  Result Value Ref Range   Sed Rate 7 0 - 30 mm/hr  C-reactive protein  Result Value Ref Range   CRP 6.6 (H) 0.0 - 4.9 mg/L      Assessment & Plan:   Problem List Items Addressed This Visit      Cardiovascular and Mediastinum   HTN (hypertension)    Elevated today. Will likely need to change his medicine, but given symptoms, will hold off until we know if he had another stroke.       Relevant Medications   lisinopril-hydrochlorothiazide (PRINZIDE,ZESTORETIC) 10-12.5 MG per tablet   Stroke, acute, embolic - Primary    Worsened symptoms today with sensation to light touch diminished on the L side, which was not the case with his previous stroke. Vision worsened. Has been seeing floaters and been dizzy x 3-4 days. Concern for evolving or 2nd stroke. Patient to go to ER. Neurologist aware. [facility] to [facility].        Relevant Medications   lisinopril-hydrochlorothiazide  (PRINZIDE,ZESTORETIC) 10-12.5 MG per tablet     Other   DM (diabetes mellitus), type 2, uncontrolled    Slightly improved from 3 months ago down to 8.3 from 8.5. Has been eating too many sweets, which he knows. Will watch diet and we will increase his metformin to 1061m BID and recheck in 3 months.       Relevant Medications   lisinopril-hydrochlorothiazide (PRINZIDE,ZESTORETIC) 10-12.5 MG per tablet   metformin (FORTAMET) 1000 MG (OSM) 24 hr tablet   Tobacco abuse    Cutting back. Did not find the patches helpful. Does not want to try wellbutrin or chantix. Encouraged him to continue to cut back. Will continue to monitor. Will get flu and pneumovax at follow up following ER visit.       Chronic headaches    Likely due to stroke. Will increase tramadol to see if that helps control headaches. Seeing Dr. SManuella Ghazion 03/14/15- would like advice on treatment as patient does not want to take opiates.       Relevant Medications   traMADol (ULTRAM) 50 MG tablet  History of stroke with residual deficit    Form for work filled out and faxed. Will likely not be able to return to his job at [employer] for [employer] Will look into long term disability.           Follow up plan: Return Following ER visit.

## 2015-04-29 NOTE — Assessment & Plan Note (Signed)
Slightly improved from 3 months ago down to 8.3 from 8.5. Has been eating too many sweets, which he knows. Will watch diet and we will increase his metformin to 1000mg  BID and recheck in 3 months.

## 2015-04-29 NOTE — Discharge Instructions (Signed)
General Headache Without Cause °A headache is pain or discomfort felt around the head or neck area. The specific cause of a headache may not be found. There are many causes and types of headaches. A few common ones are: °· Tension headaches. °· Migraine headaches. °· Cluster headaches. °· Chronic daily headaches. °HOME CARE INSTRUCTIONS  °· Keep all follow-up appointments with your caregiver or any specialist referral. °· Only take over-the-counter or prescription medicines for pain or discomfort as directed by your caregiver. °· Lie down in a dark, quiet room when you have a headache. °· Keep a headache journal to find out what may trigger your migraine headaches. For example, write down: °¨ What you eat and drink. °¨ How much sleep you get. °¨ Any change to your diet or medicines. °· Try massage or other relaxation techniques. °· Put ice packs or heat on the head and neck. Use these 3 to 4 times per day for 15 to 20 minutes each time, or as needed. °· Limit stress. °· Sit up straight, and do not tense your muscles. °· Quit smoking if you smoke. °· Limit alcohol use. °· Decrease the amount of caffeine you drink, or stop drinking caffeine. °· Eat and sleep on a regular schedule. °· Get 7 to 9 hours of sleep, or as recommended by your caregiver. °· Keep lights dim if bright lights bother you and make your headaches worse. °SEEK MEDICAL CARE IF:  °· You have problems with the medicines you were prescribed. °· Your medicines are not working. °· You have a change from the usual headache. °· You have nausea or vomiting. °SEEK IMMEDIATE MEDICAL CARE IF:  °· Your headache becomes severe. °· You have a fever. °· You have a stiff neck. °· You have loss of vision. °· You have muscular weakness or loss of muscle control. °· You start losing your balance or have trouble walking. °· You feel faint or pass out. °· You have severe symptoms that are different from your first symptoms. °MAKE SURE YOU:  °· Understand these  instructions. °· Will watch your condition. °· Will get help right away if you are not doing well or get worse. °Document Released: 07/20/2005 Document Revised: 10/12/2011 Document Reviewed: 08/05/2011 °[employer]® Patient Information ©2015 [employer]. This information is not intended to replace advice given to you by your health care provider. Make sure you discuss any questions you have with your health care provider. ° °Please return immediately if condition worsens. Please contact her primary physician or the physician you were given for referral. If you have any specialist physicians involved in her treatment and plan please also contact them. Thank you for using [facility]. ° °

## 2015-04-29 NOTE — Assessment & Plan Note (Signed)
Elevated today. Will likely need to change his medicine, but given symptoms, will hold off until we know if he had another stroke.

## 2015-04-29 NOTE — Assessment & Plan Note (Signed)
Likely due to stroke. Will increase tramadol to see if that helps control headaches. Seeing Dr. Manuella Ghazi on 03/14/15- would like advice on treatment as patient does not want to take opiates.

## 2015-04-29 NOTE — ED Notes (Signed)
Lab notified to add Sedimentation rate, spoke with Caryl Pina, states will add.

## 2015-04-29 NOTE — Assessment & Plan Note (Signed)
Form for work filled out and faxed. Will likely not be able to return to his job at [employer] for [employer] Will look into long term disability.

## 2015-04-29 NOTE — Assessment & Plan Note (Signed)
Worsened symptoms today with sensation to light touch diminished on the L side, which was not the case with his previous stroke. Vision worsened. Has been seeing floaters and been dizzy x 3-4 days. Concern for evolving or 2nd stroke. Patient to go to ER. Neurologist aware. Report called to [facility].

## 2015-04-29 NOTE — Assessment & Plan Note (Signed)
Cutting back. Did not find the patches helpful. Does not want to try wellbutrin or chantix. Encouraged him to continue to cut back. Will continue to monitor. Will get flu and pneumovax at follow up following ER visit.

## 2015-04-30 LAB — BAYER DCA HB A1C WAIVED: HB A1C (BAYER DCA - WAIVED): 8.3 % — ABNORMAL HIGH (ref <7.0)

## 2015-04-30 NOTE — ED Provider Notes (Signed)
Time Seen: Approximately 1900 I have reviewed the triage notes  Chief Complaint: Weakness   History of Present Illness: [name]. is a 59 y.o. male who was referred here by his primary 21 office for further evaluation of some increasing right eye visual disturbance along with some right leg numbness and a headache. Patient states he's noticed the eye changes for an extensive period of time which have occurred originally from a stroke back in June of this year. He states the headache is mainly a global headache and is tried Ultram without any successful relief and this is been occurring now for the past 10 days. The patient denies much in way of nausea, vomiting, left eye visual disturbances, weakness or any other concerns. He states he has an appointment with his optometrist and states that the visual loss that he is feeling is not significantly different than it has been over the last several weeks. He denies any eye pain. He received an extensive evaluation in June including carotid Dopplers and echocardiogram without a significant abnormalities seen. His headache seems to be global and relatively constant.  Past Medical History  Diagnosis Date  . History of hernia repair   . Hypertension   . Diabetes   . Stroke     Patient Active Problem List   Diagnosis Date Noted  . History of stroke with residual deficit 04/01/2015  . Chronic headaches 03/15/2015  . Stroke, acute, embolic 19/50/9326  . Tobacco abuse 01/24/2015  . DM (diabetes mellitus), type 2, uncontrolled 01/21/2015  . Hyperlipidemia 01/21/2015  . HTN (hypertension) 01/21/2015  . Sudden loss of vision 01/21/2015    Past Surgical History  Procedure Laterality Date  . Appendectomy    . Hernia repair      Past Surgical History  Procedure Laterality Date  . Appendectomy    . Hernia repair      Current Outpatient Rx  Name  Route  Sig  Dispense  Refill  . aspirin 325 MG tablet   Oral   Take 325  mg by mouth daily.         Marland Kitchen HYDROmorphone (DILAUDID) 2 MG tablet   Oral   Take 1 tablet (2 mg total) by mouth every 12 (twelve) hours as needed for severe pain.   20 tablet   0   . lisinopril-hydrochlorothiazide (PRINZIDE,ZESTORETIC) 10-12.5 MG per tablet   Oral   Take 1 tablet by mouth daily.   30 tablet   6   . metformin (FORTAMET) 1000 MG (OSM) 24 hr tablet   Oral   Take 1 tablet (1,000 mg total) by mouth 2 (two) times daily with a meal.   60 tablet   6   . Multiple Vitamins-Minerals (MULTIVITAMIN ADULT PO)   Oral   Take 1 tablet by mouth daily.         . simvastatin (ZOCOR) 40 MG tablet   Oral   Take 1 tablet (40 mg total) by mouth at bedtime.   30 tablet   3   . traMADol (ULTRAM) 50 MG tablet   Oral   Take 2 tablets (100 mg total) by mouth every 6 (six) hours as needed.   180 tablet   1     Allergies:  Lipitor  Family History: Family History  Problem Relation Age of Onset  . Diabetes Father   . Hypertension Father   . Hyperlipidemia Father     Social History: Social History  Substance Use Topics  .  Smoking status: Current Every Day Smoker -- 0.50 packs/day for 30 years    Types: Cigarettes  . Smokeless tobacco: None  . Alcohol Use: 0.0 oz/week    0 Standard drinks or equivalent per week     Comment: occasionally     Review of Systems:   10 point review of systems was performed and was otherwise negative:  Constitutional: No fever Eyes: Patient states he is losing progressively the lateral half of his right visual field. ENT: No sore throat, ear pain Cardiac: No chest pain Respiratory: No shortness of breath, wheezing, or stridor Abdomen: No abdominal pain, no vomiting, No diarrhea Endocrine: No weight loss, No night sweats Extremities: No peripheral edema, cyanosis Skin: No rashes, easy bruising Neurologic: No focal weakness, trouble with speech or swollowing Urologic: No dysuria, Hematuria, or urinary frequency   Physical  Exam:  ED Triage Vitals  Enc Vitals Group     BP 04/29/15 1910 140/93 mmHg     Pulse Rate 04/29/15 1910 98     Resp 04/29/15 1910 15     Temp --      Temp src --      SpO2 04/29/15 1910 97 %     Weight --      Height --      Head Cir --      Peak Flow --      Pain Score 04/29/15 1921 7     Pain Loc --      Pain Edu? --      Excl. in [place]? --     General: Awake , Alert , and Oriented times 3; GCS 15 Head: Normal cephalic , atraumatic Eyes: Pupils equal , round, reactive to light Nose/Throat: No nasal drainage, patent upper airway without erythema or exudate.  Neck: Supple, Full range of motion, No anterior adenopathy or palpable thyroid masses Lungs: Clear to ascultation without wheezes , rhonchi, or rales Heart: Regular rate, regular rhythm without murmurs , gallops , or rubs Abdomen: Soft, non tender without rebound, guarding , or rigidity; bowel sounds positive and symmetric in all 4 quadrants. No organomegaly .        Extremities: 2 plus symmetric pulses. No edema, clubbing or cyanosis Neurologic: normal ambulation, Motor symmetric without deficits, sensory intact Skin: warm, dry, no rashes   Labs:   All laboratory work was reviewed including any pertinent negatives or positives listed below:  Labs Reviewed  GLUCOSE, CAPILLARY - Abnormal; Notable for the following:    Glucose-Capillary 268 (*)    All other components within normal limits  BASIC METABOLIC PANEL - Abnormal; Notable for the following:    Chloride 99 (*)    Glucose, Bld 287 (*)    All other components within normal limits  URINALYSIS COMPLETEWITH MICROSCOPIC ([facility] ONLY) - Abnormal; Notable for the following:    Color, Urine YELLOW (*)    APPearance CLEAR (*)    Glucose, UA 50 (*)    All other components within normal limits  SEDIMENTATION RATE - Abnormal; Notable for the following:    Sed Rate 28 (*)    All other components within normal limits  CBC  CBG MONITORING, ED    EKG: ED ECG REPORT I,  Daymon Larsen, the attending physician, personally viewed and interpreted this ECG.  Date: 04/30/2015 EKG Time: *1705 Rate: 97 Rhythm: normal sinus rhythm with occasional PACs QRS Axis: normal Intervals: normal ST/T Wave abnormalities: Nonspecific T wave abnormality Conduction Disutrbances: Prolonged QT interval Narrative Interpretation:  unremarkable    Radiology:    EXAM: CT HEAD WITHOUT CONTRAST  TECHNIQUE: Contiguous axial images were obtained from the base of the skull through the vertex without intravenous contrast.  COMPARISON: 01/24/2015.  FINDINGS: No mass lesion, mass effect, midline shift, hydrocephalus, or acute hemorrhage. 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  The LEFT basal ganglia lacunar infarct identified on prior exam 01/24/2015 is no longer visible, probably due to partial volume averaging. Ex vacuo dilation of the LEFT lateral ventricle.  Unchanged mild atrophy.  IMPRESSION: 1. No acute intracranial abnormality. 2. 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 3. LEFT basal ganglia lacunar infarct no longer visible, likely due to partial volume averaging.  I personally reviewed the radiologic studies     ED Course:  Patient's stay here was uneventful he does not appear to have any new neurologic findings. He was primarily evaluated for his headache since this seemed to be somewhat of an unusual headache for him. Differential diagnosis includes but is not exclusive to subarachnoid hemorrhage, meningitis, encephalitis, previous head trauma, cavernous venous thrombosis, muscle tension headache, temporal arteritis, migraine or migraine equivalent, etc. His sedimentation rate here was negative and I felt this was unlikely to be temporal arteritis to explain the visual issues along with his headache. He denies any eye pain. He did describe some numbness in his right lower  extremity but again there is no focal neurologic deficits and he states he's had similar symptoms since his stroke. I felt the patient not require hospitalization and we increased his pain control on an outpatient basis with prescription for Dilaudid. I'm not sure of the source of the headache but again it does not seem to be a subarachnoid hemorrhage or intracerebral hemorrhage, etc.   Assessment:  Acute unspecified cephalgia History of previous stroke Right-sided hemianopsia   Final Clinical Impression:  Final diagnoses:  Nonintractable headache, unspecified chronicity pattern, unspecified headache type     Plan:  Patient was advised to return immediately if condition worsens. Patient was advised to follow up with her primary care physician or other specialized physicians involved and in their current assessment.            Daymon Larsen, MD 04/30/15 [phone]

## 2015-05-09 ENCOUNTER — Ambulatory Visit (INDEPENDENT_AMBULATORY_CARE_PROVIDER_SITE_OTHER): Payer: Managed Care, Other (non HMO) | Admitting: Family Medicine

## 2015-05-09 ENCOUNTER — Encounter: Payer: Self-pay | Admitting: Family Medicine

## 2015-05-09 VITALS — BP 116/76 | HR 85 | Temp 98.0°F | Ht 69.0 in | Wt 201.8 lb

## 2015-05-09 DIAGNOSIS — Z23 Encounter for immunization: Secondary | ICD-10-CM

## 2015-05-09 DIAGNOSIS — G44321 Chronic post-traumatic headache, intractable: Secondary | ICD-10-CM | POA: Diagnosis not present

## 2015-05-09 DIAGNOSIS — I10 Essential (primary) hypertension: Secondary | ICD-10-CM | POA: Diagnosis not present

## 2015-05-09 MED ORDER — METFORMIN HCL ER (OSM) 1000 MG PO TB24: 1000.0000 mg | ORAL_TABLET | Freq: Two times a day (BID) | ORAL | Status: DC

## 2015-05-09 MED ORDER — OXYCODONE HCL ER 10 MG PO T12A: 10.0000 mg | EXTENDED_RELEASE_TABLET | Freq: Two times a day (BID) | ORAL | Status: DC

## 2015-05-09 MED ORDER — GABAPENTIN 300 MG PO CAPS: 300.0000 mg | ORAL_CAPSULE | Freq: Every day | ORAL | Status: DC

## 2015-05-09 NOTE — Progress Notes (Signed)
BP 116/76 mmHg  Pulse 85  Temp(Src) 98 F (36.7 C)  Ht 5\' 9"  (1.753 m)  Wt 201 lb 12.8 oz (91.536 kg)  BMI 29.79 kg/m2  SpO2 99%   Subjective:    Patient ID: Glenn Pyo., male    DOB: 09-10-1955, 59 y.o.   MRN: [id number]  HPI: Glenn Bushway Klemens [name] is a 59 y.o. male  Chief Complaint  Patient presents with  . Follow-up    From previous visit for possible stroke. Dx was an intractible headache.  . Headache    Patient is still having headaches.    ER FOLLOW UP Time since discharge: 1 week Hospital/facility: [facility] Diagnosis: intractable headache Procedures/tests: CT of head- negative Consultants: None  New medications: Dilaudid Discharge instructions:  Follow up here Status: stable  Chronic intractable headache following stroke- notes that his headache has not gotten any better. Increased dose of tramadol doesn't help. He notes that the dilaudid eases up the headache only for a very short period time but then it comes right back. He is to see Dr. Manuella Ghazi next week and will discuss with him other options in terms of treating the headaches.  Duration: since stroke Onset: sudden Severity: severe Quality: sharp, dull, aching and pressure-like Frequency: constant Location: global Headache duration: constant Radiation: no Headache status at time of visit: current headache Treatments attempted: Treatments attempted: rest, APAP, ibuprofen, aleve", excedrine and amitriptyline   Aura: no Nausea:  no Vomiting: no Photophobia:  yes Phonophobia:  yes Effect on social functioning:  yes Numbers of missed days of school/work each month:  Confusion:  yes Gait disturbance/ataxia:  no Behavioral changes:  no Fevers:  no  Relevant past medical, surgical, family and social history reviewed and updated as indicated. Interim medical history since our last visit reviewed. Allergies and medications reviewed and updated.  Review of Systems  Constitutional: Negative.    Respiratory: Negative.   Cardiovascular: Negative.   Skin: Negative.   Psychiatric/Behavioral: Negative.     Per HPI unless specifically indicated above     Objective:    BP 116/76 mmHg  Pulse 85  Temp(Src) 98 F (36.7 C)  Ht 5\' 9"  (1.753 m)  Wt 201 lb 12.8 oz (91.536 kg)  BMI 29.79 kg/m2  SpO2 99%  Wt Readings from Last 3 Encounters:  05/09/15 201 lb 12.8 oz (91.536 kg)  04/29/15 200 lb (90.719 kg)  04/01/15 200 lb 2 oz (90.776 kg)    Physical Exam  Constitutional: He is oriented to person, place, and time. He appears well-developed and well-nourished. No distress.  HENT:  Head: Normocephalic and atraumatic.  Right Ear: Hearing normal.  Left Ear: Hearing normal.  Nose: Nose normal.  Eyes: Conjunctivae and lids are normal. Right eye exhibits no discharge. Left eye exhibits no discharge. No scleral icterus.  Cardiovascular: Normal rate, regular rhythm, normal heart sounds and intact distal pulses.  Exam reveals no gallop and no friction rub.   No murmur heard. Pulmonary/Chest: Effort normal and breath sounds normal. No respiratory distress. He has no wheezes. He has no rales. He exhibits no tenderness.  Musculoskeletal: Normal range of motion.  Neurological: He is alert and oriented to person, place, and time.  Skin: Skin is warm, dry and intact. No rash noted. No erythema. No pallor.  Psychiatric: He has a normal mood and affect. His speech is normal and behavior is normal. Judgment and thought content normal. Cognition and memory are normal.  Nursing note and vitals reviewed.  Results for orders placed or performed during the hospital encounter of 04/29/15  Glucose, capillary  Result Value Ref Range   Glucose-Capillary 268 (H) 65 - 99 mg/dL  Basic metabolic panel  Result Value Ref Range   Sodium 135 135 - 145 mmol/L   Potassium 4.1 3.5 - 5.1 mmol/L   Chloride 99 (L) 101 - 111 mmol/L   CO2 27 22 - 32 mmol/L   Glucose, Bld 287 (H) 65 - 99 mg/dL   BUN 10 6 - 20  mg/dL   Creatinine, Ser 0.75 0.61 - 1.24 mg/dL   Calcium 9.9 8.9 - 10.3 mg/dL   GFR calc non Af Amer >60 >60 mL/min   GFR calc Af Amer >60 >60 mL/min   Anion gap 9 5 - 15  CBC  Result Value Ref Range   WBC 9.8 3.8 - 10.6 K/uL   RBC 4.87 4.40 - 5.90 MIL/uL   Hemoglobin 15.0 13.0 - 18.0 g/dL   HCT 43.1 40.0 - 52.0 %   MCV 88.6 80.0 - 100.0 fL   MCH 30.8 26.0 - 34.0 pg   MCHC 34.8 32.0 - 36.0 g/dL   RDW 13.2 11.5 - 14.5 %   Platelets 271 150 - 440 K/uL  Urinalysis complete, with microscopic ([facility] only)  Result Value Ref Range   Color, Urine YELLOW (A) YELLOW   APPearance CLEAR (A) CLEAR   Glucose, UA 50 (A) NEGATIVE mg/dL   Bilirubin Urine NEGATIVE NEGATIVE   Ketones, ur NEGATIVE NEGATIVE mg/dL   Specific Gravity, Urine 1.010 1.005 - 1.030   Hgb urine dipstick NEGATIVE NEGATIVE   pH 6.0 5.0 - 8.0   Protein, ur NEGATIVE NEGATIVE mg/dL   Nitrite NEGATIVE NEGATIVE   Leukocytes, UA NEGATIVE NEGATIVE   RBC / HPF 0-5 0 - 5 RBC/hpf   WBC, UA 0-5 0 - 5 WBC/hpf   Bacteria, UA NONE SEEN NONE SEEN   Squamous Epithelial / LPF NONE SEEN NONE SEEN  Sedimentation rate  Result Value Ref Range   Sed Rate 28 (H) 0 - 20 mm/hr      Assessment & Plan:   Problem List Items Addressed This Visit      Cardiovascular and Mediastinum   HTN (hypertension)    Under much better control today. Continue current regimen. Continue to monitor.         Other   Chronic headaches - Primary    No benefit from amitriptyline. Will stop it. Will try gabapentin for neuropathic pain. Will start oxycontin to see if long-acting opiate helps with the pain. Advised patient to follow up with neurology and we hope they will have advice about treatment for his headaches.      Relevant Medications   gabapentin (NEURONTIN) 300 MG capsule   OxyCODONE (OXYCONTIN) 10 mg T12A 12 hr tablet    Other Visit Diagnoses    Immunization due        Relevant Orders    Pneumococcal polysaccharide vaccine 23-valent greater  than or equal to 2yo subcutaneous/IM    Flu Vaccine QUAD 36+ mos IM (Fluarix & Fluzone Quad PF        Follow up plan: Return in about 4 weeks (around 06/06/2015).

## 2015-05-09 NOTE — Assessment & Plan Note (Signed)
Under much better control today. Continue current regimen. Continue to monitor.  

## 2015-05-09 NOTE — Assessment & Plan Note (Signed)
No benefit from amitriptyline. Will stop it. Will try gabapentin for neuropathic pain. Will start oxycontin to see if long-acting opiate helps with the pain. Advised patient to follow up with neurology and we hope they will have advice about treatment for his headaches.

## 2015-05-10 ENCOUNTER — Telehealth: Payer: Self-pay | Admitting: Family Medicine

## 2015-05-10 ENCOUNTER — Telehealth: Payer: Self-pay

## 2015-05-10 NOTE — Telephone Encounter (Signed)
pts wife would like a call back. She didn't tell me what it was about but she did tell me that he was ok. Sorry i don't have more information but she said she needed to speak with Dr Lenna Sciara

## 2015-05-10 NOTE — Telephone Encounter (Signed)
Called pharmacy to notify them that the requested prescription had already been sent and it was sent to [facility] in [facility]

## 2015-05-10 NOTE — Telephone Encounter (Signed)
Patients wife called and was concerned about the gabapentin and oxycontin. I explained to her the the gabapentin is to try to lessen the nerve pain and the other is to stop the headache when it happens.

## 2015-05-16 DIAGNOSIS — G621 Alcoholic polyneuropathy: Secondary | ICD-10-CM | POA: Insufficient documentation

## 2015-05-16 DIAGNOSIS — E1142 Type 2 diabetes mellitus with diabetic polyneuropathy: Secondary | ICD-10-CM | POA: Insufficient documentation

## 2015-05-23 ENCOUNTER — Encounter: Payer: Self-pay | Admitting: Family Medicine

## 2015-05-29 ENCOUNTER — Other Ambulatory Visit: Payer: Self-pay

## 2015-05-29 NOTE — Telephone Encounter (Signed)
LAST VISIT: 05/09/2015 Patient has an upcoming appointment 06/06/2015.  Request for simvastatin 40 mg tablet.

## 2015-05-30 MED ORDER — SIMVASTATIN 40 MG PO TABS: 40.0000 mg | ORAL_TABLET | Freq: Every day | ORAL | Status: DC

## 2015-06-06 ENCOUNTER — Encounter: Payer: Self-pay | Admitting: Family Medicine

## 2015-06-06 ENCOUNTER — Ambulatory Visit (INDEPENDENT_AMBULATORY_CARE_PROVIDER_SITE_OTHER): Payer: Managed Care, Other (non HMO) | Admitting: Family Medicine

## 2015-06-06 VITALS — BP 128/81 | HR 86 | Temp 98.6°F | Wt 199.8 lb

## 2015-06-06 DIAGNOSIS — G44321 Chronic post-traumatic headache, intractable: Secondary | ICD-10-CM | POA: Diagnosis not present

## 2015-06-06 DIAGNOSIS — E785 Hyperlipidemia, unspecified: Secondary | ICD-10-CM | POA: Diagnosis not present

## 2015-06-06 DIAGNOSIS — I10 Essential (primary) hypertension: Secondary | ICD-10-CM

## 2015-06-06 DIAGNOSIS — Z23 Encounter for immunization: Secondary | ICD-10-CM | POA: Diagnosis not present

## 2015-06-06 MED ORDER — GABAPENTIN 300 MG PO CAPS: 300.0000 mg | ORAL_CAPSULE | Freq: Every day | ORAL | Status: DC

## 2015-06-06 MED ORDER — LISINOPRIL-HYDROCHLOROTHIAZIDE 10-12.5 MG PO TABS: 1.0000 | ORAL_TABLET | Freq: Every day | ORAL | Status: DC

## 2015-06-06 MED ORDER — OXYCODONE HCL ER 10 MG PO T12A: 10.0000 mg | EXTENDED_RELEASE_TABLET | Freq: Two times a day (BID) | ORAL | Status: DC

## 2015-06-06 MED ORDER — SIMVASTATIN 40 MG PO TABS: 40.0000 mg | ORAL_TABLET | Freq: Every day | ORAL | Status: DC

## 2015-06-06 NOTE — Assessment & Plan Note (Signed)
Continue to follow with neurology. Going to have TA biopsy and if negative, will have SPG block. Will continue current meds for now, with goal of getting him off the oxycontin ASAP

## 2015-06-06 NOTE — Progress Notes (Signed)
BP 128/81 mmHg  Pulse 86  Temp(Src) 98.6 F (37 C)  Wt 199 lb 12.8 oz (90.629 kg)  SpO2 97%   Subjective:    Patient ID: Glenn Pyo., male    DOB: Sep 29, 1955, 59 y.o.   MRN: [id number]  HPI: Glenn Heard Geyer [name] is a 59 y.o. male  Chief Complaint  Patient presents with  . Hypertension    Patient needs his BP meds in a 90 day supply sent to his pharmacy  . Hyperlipidemia    Patient needs his meds in a 90 day supply sent to his pharmacy  . Short Term Disability Papers   Went to see Dr. Manuella Ghazi and Dr. Tamala Julian. Is going to have temporal artery biopsy. Dr. Manuella Ghazi doesn't think that it is TA, but wants to rule everything out. [name] is anxious about the biopsy or the SPG block, which is the next step, but wants the headaches to stop. He just started his gabapentin, because he was anxious about it. He has recently started it, but hasn't noticed much a of a difference yet. He finds that the oxycontin takes the edge off, but doesn't really make his pain go away. He has otherwise been doing OK with no other concerns or complaints at this time.   Relevant past medical, surgical, family and social history reviewed and updated as indicated. Interim medical history since our last visit reviewed. Allergies and medications reviewed and updated.  Review of Systems  Constitutional: Negative.   Respiratory: Negative.   Cardiovascular: Negative.   Musculoskeletal: Negative.   Neurological: Positive for headaches. Negative for dizziness, tremors, seizures, syncope, facial asymmetry, speech difficulty, weakness, light-headedness and numbness.  Psychiatric/Behavioral: Negative.     Per HPI unless specifically indicated above     Objective:    BP 128/81 mmHg  Pulse 86  Temp(Src) 98.6 F (37 C)  Wt 199 lb 12.8 oz (90.629 kg)  SpO2 97%  Wt Readings from Last 3 Encounters:  06/06/15 199 lb 12.8 oz (90.629 kg)  05/09/15 201 lb 12.8 oz (91.536 kg)  04/29/15 200 lb (90.719 kg)    Physical  Exam  Constitutional: He is oriented to person, place, and time. He appears well-developed and well-nourished. No distress.  HENT:  Head: Normocephalic and atraumatic.  Right Ear: Hearing normal.  Left Ear: Hearing normal.  Nose: Nose normal.  Eyes: Conjunctivae and lids are normal. Right eye exhibits no discharge. Left eye exhibits no discharge. No scleral icterus.  Cardiovascular: Normal rate, regular rhythm, normal heart sounds and intact distal pulses.  Exam reveals no gallop and no friction rub.   No murmur heard. Pulmonary/Chest: Effort normal and breath sounds normal. No respiratory distress. He has no wheezes. He has no rales. He exhibits no tenderness.  Musculoskeletal: Normal range of motion.  Neurological: He is alert and oriented to person, place, and time.  Skin: Skin is warm, dry and intact. No rash noted. No erythema. No pallor.  Psychiatric: He has a normal mood and affect. His speech is normal and behavior is normal. Judgment and thought content normal. Cognition and memory are normal.  Nursing note and vitals reviewed.   Results for orders placed or performed during the hospital encounter of 04/29/15  Glucose, capillary  Result Value Ref Range   Glucose-Capillary 268 (H) 65 - 99 mg/dL  Basic metabolic panel  Result Value Ref Range   Sodium 135 135 - 145 mmol/L   Potassium 4.1 3.5 - 5.1 mmol/L   Chloride 99 (L)  101 - 111 mmol/L   CO2 27 22 - 32 mmol/L   Glucose, Bld 287 (H) 65 - 99 mg/dL   BUN 10 6 - 20 mg/dL   Creatinine, Ser 0.75 0.61 - 1.24 mg/dL   Calcium 9.9 8.9 - 10.3 mg/dL   GFR calc non Af Amer >60 >60 mL/min   GFR calc Af Amer >60 >60 mL/min   Anion gap 9 5 - 15  CBC  Result Value Ref Range   WBC 9.8 3.8 - 10.6 K/uL   RBC 4.87 4.40 - 5.90 MIL/uL   Hemoglobin 15.0 13.0 - 18.0 g/dL   HCT 43.1 40.0 - 52.0 %   MCV 88.6 80.0 - 100.0 fL   MCH 30.8 26.0 - 34.0 pg   MCHC 34.8 32.0 - 36.0 g/dL   RDW 13.2 11.5 - 14.5 %   Platelets 271 150 - 440 K/uL   Urinalysis complete, with microscopic ([facility] only)  Result Value Ref Range   Color, Urine YELLOW (A) YELLOW   APPearance CLEAR (A) CLEAR   Glucose, UA 50 (A) NEGATIVE mg/dL   Bilirubin Urine NEGATIVE NEGATIVE   Ketones, ur NEGATIVE NEGATIVE mg/dL   Specific Gravity, Urine 1.010 1.005 - 1.030   Hgb urine dipstick NEGATIVE NEGATIVE   pH 6.0 5.0 - 8.0   Protein, ur NEGATIVE NEGATIVE mg/dL   Nitrite NEGATIVE NEGATIVE   Leukocytes, UA NEGATIVE NEGATIVE   RBC / HPF 0-5 0 - 5 RBC/hpf   WBC, UA 0-5 0 - 5 WBC/hpf   Bacteria, UA NONE SEEN NONE SEEN   Squamous Epithelial / LPF NONE SEEN NONE SEEN  Sedimentation rate  Result Value Ref Range   Sed Rate 28 (H) 0 - 20 mm/hr      Assessment & Plan:   Problem List Items Addressed This Visit      Cardiovascular and Mediastinum   HTN (hypertension)    Under good control. Continue to monitor. Refill sent to his pharmacy.       Relevant Medications   simvastatin (ZOCOR) 40 MG tablet   lisinopril-hydrochlorothiazide (PRINZIDE,ZESTORETIC) 10-12.5 MG tablet     Other   Hyperlipidemia    Has been under good control. Refill sent to his pharmacy. Continue to monitor.       Relevant Medications   simvastatin (ZOCOR) 40 MG tablet   lisinopril-hydrochlorothiazide (PRINZIDE,ZESTORETIC) 10-12.5 MG tablet   Chronic headaches - Primary    Continue to follow with neurology. Going to have TA biopsy and if negative, will have SPG block. Will continue current meds for now, with goal of getting him off the oxycontin ASAP      Relevant Medications   gabapentin (NEURONTIN) 300 MG capsule   oxyCODONE (OXYCONTIN) 10 mg 12 hr tablet    Other Visit Diagnoses    Immunization due        Relevant Orders    Flu Vaccine QUAD 36+ mos PF IM (Fluarix & Fluzone Quad PF) (Completed)    Pneumococcal polysaccharide vaccine 23-valent greater than or equal to 2yo subcutaneous/IM (Completed)        Follow up plan: Return in about 4 weeks (around  07/04/2015).

## 2015-06-06 NOTE — Assessment & Plan Note (Signed)
Under good control. Continue to monitor. Refill sent to his pharmacy.

## 2015-06-06 NOTE — Assessment & Plan Note (Signed)
Has been under good control. Refill sent to his pharmacy. Continue to monitor.

## 2015-06-19 ENCOUNTER — Inpatient Hospital Stay: Admission: RE | Admit: 2015-06-19 | Payer: Managed Care, Other (non HMO) | Source: Ambulatory Visit

## 2015-06-25 ENCOUNTER — Encounter: Admission: RE | Payer: Self-pay | Source: Ambulatory Visit

## 2015-06-25 ENCOUNTER — Ambulatory Visit: Admission: RE | Admit: 2015-06-25 | Payer: Managed Care, Other (non HMO) | Source: Ambulatory Visit | Admitting: Surgery

## 2015-06-25 SURGERY — BIOPSY TEMPORAL ARTERY
Anesthesia: Choice

## 2015-07-05 ENCOUNTER — Ambulatory Visit: Payer: Managed Care, Other (non HMO) | Admitting: Family Medicine

## 2015-07-12 ENCOUNTER — Ambulatory Visit (INDEPENDENT_AMBULATORY_CARE_PROVIDER_SITE_OTHER): Payer: Managed Care, Other (non HMO) | Admitting: Family Medicine

## 2015-07-12 ENCOUNTER — Encounter: Payer: Self-pay | Admitting: Family Medicine

## 2015-07-12 VITALS — BP 120/74 | HR 88 | Temp 98.1°F | Ht 70.0 in | Wt 201.0 lb

## 2015-07-12 DIAGNOSIS — I6930 Unspecified sequelae of cerebral infarction: Secondary | ICD-10-CM

## 2015-07-12 MED ORDER — METFORMIN HCL 1000 MG PO TABS: 1000.0000 mg | ORAL_TABLET | Freq: Two times a day (BID) | ORAL | Status: DC

## 2015-07-12 MED ORDER — OXYCODONE HCL ER 10 MG PO T12A: 10.0000 mg | EXTENDED_RELEASE_TABLET | Freq: Two times a day (BID) | ORAL | Status: DC

## 2015-07-12 NOTE — Progress Notes (Signed)
BP 120/74 mmHg  Pulse 88  Temp(Src) 98.1 F (36.7 C)  Ht 5\' 10"  (1.778 m)  Wt 201 lb (91.173 kg)  BMI 28.84 kg/m2  SpO2 98%   Subjective:    Patient ID: [name]., male    DOB: [birth year], 59 y.o.   MRN: [id number]  HPI: [name]. is a 59 y.o. male  Chief Complaint  Patient presents with  . Follow-up    States everything is going better.  Marland Kitchen Headache    Still having headache  . Medication Problem    Stopped taking gabapentin because he got very dizzy.    Doing much better. Headache almost gone. Only needing to take the oxycodone every 3-4 days. Still can't see out of his R eye. Otherwise feeling well. Backed out of TA biopsy because he got scared. No other concerns or complaints at this time.   Relevant past medical, surgical, family and social history reviewed and updated as indicated. Interim medical history since our last visit reviewed. Allergies and medications reviewed and updated.  Review of Systems  Constitutional: Negative.   Eyes: Positive for visual disturbance.  Respiratory: Negative.   Cardiovascular: Negative.   Musculoskeletal: Negative.   Psychiatric/Behavioral: Negative.     Per HPI unless specifically indicated above     Objective:    BP 120/74 mmHg  Pulse 88  Temp(Src) 98.1 F (36.7 C)  Ht 5\' 10"  (1.778 m)  Wt 201 lb (91.173 kg)  BMI 28.84 kg/m2  SpO2 98%  Wt Readings from Last 3 Encounters:  07/12/15 201 lb (91.173 kg)  06/06/15 199 lb 12.8 oz (90.629 kg)  05/09/15 201 lb 12.8 oz (91.536 kg)    Physical Exam  Constitutional: He is oriented to person, place, and time. He appears well-developed and well-nourished. No distress.  HENT:  Head: Normocephalic and atraumatic.  Right Ear: Hearing normal.  Left Ear: Hearing normal.  Nose: Nose normal.  Eyes: Conjunctivae and lids are normal. Right eye exhibits no discharge. Left eye exhibits no discharge. No scleral icterus.  Cardiovascular: Normal rate, regular rhythm and  intact distal pulses.  Exam reveals no gallop and no friction rub.   No murmur heard. Pulmonary/Chest: Effort normal and breath sounds normal. No respiratory distress. He has no wheezes. He has no rales. He exhibits no tenderness.  Musculoskeletal: Normal range of motion.  Neurological: He is alert and oriented to person, place, and time.  Skin: Skin is warm, dry and intact. No rash noted. No erythema. No pallor.  Psychiatric: He has a normal mood and affect. His speech is normal and behavior is normal. Judgment and thought content normal. He exhibits abnormal recent memory.  Nursing note and vitals reviewed.   Results for orders placed or performed during the hospital encounter of 04/29/15  Glucose, capillary  Result Value Ref Range   Glucose-Capillary 268 (H) 65 - 99 mg/dL  Basic metabolic panel  Result Value Ref Range   Sodium 135 135 - 145 mmol/L   Potassium 4.1 3.5 - 5.1 mmol/L   Chloride 99 (L) 101 - 111 mmol/L   CO2 27 22 - 32 mmol/L   Glucose, Bld 287 (H) 65 - 99 mg/dL   BUN 10 6 - 20 mg/dL   Creatinine, Ser 0.75 0.61 - 1.24 mg/dL   Calcium 9.9 8.9 - 10.3 mg/dL   GFR calc non Af Amer >60 >60 mL/min   GFR calc Af Amer >60 >60 mL/min   Anion gap 9 5 -  15  CBC  Result Value Ref Range   WBC 9.8 3.8 - 10.6 K/uL   RBC 4.87 4.40 - 5.90 MIL/uL   Hemoglobin 15.0 13.0 - 18.0 g/dL   HCT 43.1 40.0 - 52.0 %   MCV 88.6 80.0 - 100.0 fL   MCH 30.8 26.0 - 34.0 pg   MCHC 34.8 32.0 - 36.0 g/dL   RDW 13.2 11.5 - 14.5 %   Platelets 271 150 - 440 K/uL  Urinalysis complete, with microscopic ([facility] only)  Result Value Ref Range   Color, Urine YELLOW (A) YELLOW   APPearance CLEAR (A) CLEAR   Glucose, UA 50 (A) NEGATIVE mg/dL   Bilirubin Urine NEGATIVE NEGATIVE   Ketones, ur NEGATIVE NEGATIVE mg/dL   Specific Gravity, Urine 1.010 1.005 - 1.030   Hgb urine dipstick NEGATIVE NEGATIVE   pH 6.0 5.0 - 8.0   Protein, ur NEGATIVE NEGATIVE mg/dL   Nitrite NEGATIVE NEGATIVE   Leukocytes, UA  NEGATIVE NEGATIVE   RBC / HPF 0-5 0 - 5 RBC/hpf   WBC, UA 0-5 0 - 5 WBC/hpf   Bacteria, UA NONE SEEN NONE SEEN   Squamous Epithelial / LPF NONE SEEN NONE SEEN  Sedimentation rate  Result Value Ref Range   Sed Rate 28 (H) 0 - 20 mm/hr      Assessment & Plan:   Problem List Items Addressed This Visit      Other   History of stroke with residual deficit - Primary    Headaches better. Continue oxycodone as needed, smallest amount needed. Recheck in 1 month. Form for work filled out. Will work on getting disability. Needs to contact county          Follow up plan: Return in about 4 weeks (around 08/09/2015).

## 2015-07-12 NOTE — Patient Instructions (Signed)
Call or contact the county where she passed away and ask them if they have the date your ex wife passed away for the form

## 2015-07-12 NOTE — Assessment & Plan Note (Signed)
Headaches better. Continue oxycodone as needed, smallest amount needed. Recheck in 1 month. Form for work filled out. Will work on getting disability. Needs to contact county

## 2015-07-22 ENCOUNTER — Telehealth: Payer: Self-pay

## 2015-07-22 NOTE — Telephone Encounter (Signed)
Received a voicemail from patients disability company, they need a copy of the most recent office note from 07/12/15 faxed to [phone]. Copy printed and faxed to them.

## 2015-08-09 ENCOUNTER — Ambulatory Visit: Payer: Managed Care, Other (non HMO) | Admitting: Family Medicine

## 2015-08-09 ENCOUNTER — Ambulatory Visit (INDEPENDENT_AMBULATORY_CARE_PROVIDER_SITE_OTHER): Payer: Managed Care, Other (non HMO) | Admitting: Family Medicine

## 2015-08-09 ENCOUNTER — Encounter: Payer: Self-pay | Admitting: Family Medicine

## 2015-08-09 VITALS — BP 124/83 | HR 87 | Temp 98.0°F | Ht 68.5 in | Wt 194.0 lb

## 2015-08-09 DIAGNOSIS — M79674 Pain in right toe(s): Secondary | ICD-10-CM

## 2015-08-09 DIAGNOSIS — Z23 Encounter for immunization: Secondary | ICD-10-CM

## 2015-08-09 DIAGNOSIS — E785 Hyperlipidemia, unspecified: Secondary | ICD-10-CM

## 2015-08-09 DIAGNOSIS — E1165 Type 2 diabetes mellitus with hyperglycemia: Secondary | ICD-10-CM

## 2015-08-09 DIAGNOSIS — I6930 Unspecified sequelae of cerebral infarction: Secondary | ICD-10-CM

## 2015-08-09 LAB — BAYER DCA HB A1C WAIVED: HB A1C (BAYER DCA - WAIVED): 9.6 % — ABNORMAL HIGH (ref <7.0)

## 2015-08-09 MED ORDER — CANAGLIFLOZIN 100 MG PO TABS: 100.0000 mg | ORAL_TABLET | Freq: Every day | ORAL | Status: DC

## 2015-08-09 NOTE — Progress Notes (Signed)
BP 124/83 mmHg  Pulse 87  Temp(Src) 98 F (36.7 C)  Ht 5' 8.5" (1.74 m)  Wt 194 lb (87.998 kg)  BMI 29.07 kg/m2  SpO2 99%   Subjective:    Patient ID: Glenn Gerold., male    DOB: 1956/01/29, 60 y.o.   MRN: [id number]  HPI: Glenn Rezendes. is a 60 y.o. male  Chief Complaint  Patient presents with  . Follow-up  . Toe Pain   TOE PAIN Duration: 2 weeks Involved toe: rightbig toe  Mechanism of injury: trauma- kicked a chair and the toenail hurts  Onset: sudden Severity: moderate  Quality: aching and throbbing Frequency: constant Radiation: no Aggravating factors: Squeezing it  Alleviating factors: nothing  Status: better Treatments attempted: rest  Relief with NSAIDs?: No NSAIDs Taken Morning stiffness: no Redness: no  Bruising: yes Swelling: no Paresthesias / decreased sensation: no Fevers: no  Working with a Chief Executive Officer on getting his disability because he doesn't remember many things about his first marriage with the injury  DIABETES Hypoglycemic episodes:no Polydipsia/polyuria: no Visual disturbance: yes Chest pain: no Paresthesias: no Glucose Monitoring: no Taking Insulin?: no Blood Pressure Monitoring: not checking Retinal Examination: Up to Date Foot Exam: Up to Date Diabetic Education: Completed Pneumovax: Up to Date Influenza: Up to Date Aspirin: yes   HYPERLIPIDEMIA Hyperlipidemia status: well controlled Satisfied with current treatment?  no Side effects:  no Medication compliance: good compliance Supplements: none Aspirin:  yes Chest pain:  no  Relevant past medical, surgical, family and social history reviewed and updated as indicated. Interim medical history since our last visit reviewed. Allergies and medications reviewed and updated.  Review of Systems  Constitutional: Negative.   Respiratory: Negative.   Cardiovascular: Negative.   Psychiatric/Behavioral: Negative.     Per HPI unless specifically indicated above      Objective:    BP 124/83 mmHg  Pulse 87  Temp(Src) 98 F (36.7 C)  Ht 5' 8.5" (1.74 m)  Wt 194 lb (87.998 kg)  BMI 29.07 kg/m2  SpO2 99%  Wt Readings from Last 3 Encounters:  08/09/15 194 lb (87.998 kg)  07/12/15 201 lb (91.173 kg)  06/06/15 199 lb 12.8 oz (90.629 kg)    Physical Exam  Constitutional: He is oriented to person, place, and time. He appears well-developed and well-nourished. No distress.  HENT:  Head: Normocephalic and atraumatic.  Right Ear: Hearing normal.  Left Ear: Hearing normal.  Nose: Nose normal.  Eyes: Conjunctivae and lids are normal. Right eye exhibits no discharge. Left eye exhibits no discharge. No scleral icterus.  Cardiovascular: Normal rate, regular rhythm, normal heart sounds and intact distal pulses.  Exam reveals no gallop and no friction rub.   No murmur heard. Pulmonary/Chest: Effort normal and breath sounds normal. No respiratory distress. He has no wheezes. He has no rales. He exhibits no tenderness.  Musculoskeletal: Normal range of motion.  Neurological: He is alert and oriented to person, place, and time.  Skin: Skin is warm, dry and intact. No rash noted. No erythema. No pallor.  Psychiatric: He has a normal mood and affect. His speech is normal and behavior is normal. Judgment and thought content normal. Cognition and memory are normal.  Nursing note and vitals reviewed.   Results for orders placed or performed during the hospital encounter of 04/29/15  Glucose, capillary  Result Value Ref Range   Glucose-Capillary 268 (H) 65 - 99 mg/dL  Basic metabolic panel  Result Value Ref Range   Sodium  135 135 - 145 mmol/L   Potassium 4.1 3.5 - 5.1 mmol/L   Chloride 99 (L) 101 - 111 mmol/L   CO2 27 22 - 32 mmol/L   Glucose, Bld 287 (H) 65 - 99 mg/dL   BUN 10 6 - 20 mg/dL   Creatinine, Ser 0.75 0.61 - 1.24 mg/dL   Calcium 9.9 8.9 - 10.3 mg/dL   GFR calc non Af Amer >60 >60 mL/min   GFR calc Af Amer >60 >60 mL/min   Anion gap 9 5 - 15   CBC  Result Value Ref Range   WBC 9.8 3.8 - 10.6 K/uL   RBC 4.87 4.40 - 5.90 MIL/uL   Hemoglobin 15.0 13.0 - 18.0 g/dL   HCT 43.1 40.0 - 52.0 %   MCV 88.6 80.0 - 100.0 fL   MCH 30.8 26.0 - 34.0 pg   MCHC 34.8 32.0 - 36.0 g/dL   RDW 13.2 11.5 - 14.5 %   Platelets 271 150 - 440 K/uL  Urinalysis complete, with microscopic ([facility] only)  Result Value Ref Range   Color, Urine YELLOW (A) YELLOW   APPearance CLEAR (A) CLEAR   Glucose, UA 50 (A) NEGATIVE mg/dL   Bilirubin Urine NEGATIVE NEGATIVE   Ketones, ur NEGATIVE NEGATIVE mg/dL   Specific Gravity, Urine 1.010 1.005 - 1.030   Hgb urine dipstick NEGATIVE NEGATIVE   pH 6.0 5.0 - 8.0   Protein, ur NEGATIVE NEGATIVE mg/dL   Nitrite NEGATIVE NEGATIVE   Leukocytes, UA NEGATIVE NEGATIVE   RBC / HPF 0-5 0 - 5 RBC/hpf   WBC, UA 0-5 0 - 5 WBC/hpf   Bacteria, UA NONE SEEN NONE SEEN   Squamous Epithelial / LPF NONE SEEN NONE SEEN  Sedimentation rate  Result Value Ref Range   Sed Rate 28 (H) 0 - 20 mm/hr      Assessment & Plan:   Problem List Items Addressed This Visit      Endocrine   DM (diabetes mellitus), type 2, uncontrolled ([place]) - Primary    A1c today came back at 9.6. Continue current regimen, will start invokana and check in 1 month. Continue diet and exercise. Recheck A1c in 3 months.       Relevant Medications   canagliflozin (INVOKANA) 100 MG TABS tablet   Other Relevant Orders   Comprehensive metabolic panel   Bayer DCA Hb A1c Waived     Other   Hyperlipidemia    Checking levels today. Continue current regimen. Continue to monitor. Refill given today.      Relevant Orders   Lipid Panel w/o Chol/HDL Ratio   History of stroke with residual deficit    Headaches doing well. Not needing oxycodone. 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 Form for work filled out. Will work on getting disability.          Other Visit Diagnoses    Immunization due        Relevant Orders    Varicella-zoster vaccine  subcutaneous (Completed)    Toe pain, right        Not broken. May lose toenail. RICE. Call if not getting better.         Follow up plan: Return in about 4 weeks (around 09/06/2015).

## 2015-08-09 NOTE — Assessment & Plan Note (Signed)
Checking levels today. Continue current regimen. Continue to monitor. Refill given today.

## 2015-08-09 NOTE — Assessment & Plan Note (Addendum)
A1c today came back at 9.6. Continue current regimen, will start invokana and check in 1 month. Continue diet and exercise. Recheck A1c in 3 months.

## 2015-08-09 NOTE — Assessment & Plan Note (Addendum)
Headaches doing well. Not needing oxycodone. 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 Form for work filled out. Will work on getting disability.

## 2015-08-09 NOTE — Patient Instructions (Signed)
Varicella-Zoster Virus Vaccine Live injection  What is this medicine?  VARICELLA VIRUS VACCINE (var uh SEL uh VAHY ruhs vak SEEN) is used to prevent infections of chickenpox.  HERPES ZOSTER VIRUS VACCINE (HUR peez ZOS ter vahy ruhs vak SEEN) is used to prevent shingles in adults 61 years old and over. This vaccine is not used to treat shingles or nerve pain from shingles.  These medicines may be used for other purposes; ask your health care provider or pharmacist if you have questions.  This medicine may be used for other purposes; ask your health care provider or pharmacist if you have questions.  What should I tell my health care provider before I take this medicine?  They need to know if you have any of the following conditions:  -blood disorders or disease  -cancer like leukemia or lymphoma  -immune system problems or therapy  -infection with fever  -recent immune globulin therapy  -tuberculosis  -an unusual or allergic reaction to vaccines, neomycin, gelatin, other medicines, foods, dyes, or preservatives  -pregnant or trying to get pregnant  -breast-feeding  How should I use this medicine?  These vaccines are for injection under the skin. They are given by a health care professional.  A copy of Vaccine Information Statements will be given before each varicella virus vaccination. Read this sheet carefully each time. The sheet may change frequently. A Vaccine Information Statement is not given before the herpes zoster virus vaccine.  Talk to your pediatrician regarding the use of the varicella virus vaccine in children. While this drug may be prescribed for children as young as 12 months of age for selected conditions, precautions do apply. The herpes zoster virus vaccine is not approved in children.  Overdosage: If you think you have taken too much of this medicine contact a poison control center or emergency room at once.  NOTE: This medicine is only for you. Do not share this medicine with others.  What if I  miss a dose?  Keep appointments for follow-up (booster) doses of varicella virus vaccine as directed. It is important not to miss your dose. Call your doctor or health care professional if you are unable to keep an appointment.  Follow-up (booster) doses are not needed for the herpes zoster virus vaccine.  What may interact with this medicine?  Do not take these medicines with any of the following medications:  -adalimumab  -anakinra  -etanercept  -infliximab  -medicines that suppress your immune system  -medicines to treat cancer  These medicines may also interact with the following medications:  -aspirin and aspirin-like medicines (varicella virus vaccine only)  -blood transfusions (varicella virus vaccine only)  -immunoglobulins (varicella virus vaccine only)  -steroid medicines like prednisone or cortisone  This list may not describe all possible interactions. Give your health care provider a list of all the medicines, herbs, non-prescription drugs, or dietary supplements you use. Also tell them if you smoke, drink alcohol, or use illegal drugs. Some items may interact with your medicine.  What should I watch for while using this medicine?  Visit your doctor for regular check ups.  These vaccines, like all vaccines, may not fully protect everyone.  After receiving these vaccines it may be possible to pass chickenpox infection to others. For up to 6 weeks, avoid people with immune system problems, pregnant women who have not had chickenpox, newborns of women who have not had chickenpox, and all newborns born at less than 28 weeks of pregnancy. Talk to your   doctor for more information.  Do not become pregnant for 3 months after taking these vaccines. Women should inform their doctor if they wish to become pregnant or think they might be pregnant. There is a potential for serious side effects to an unborn child. Talk to your health care professional or pharmacist for more information.  What side effects may I  notice from receiving this medicine?  Side effects that you should report to your doctor or health care professional as soon as possible:  -allergic reactions like skin rash, itching or hives, swelling of the face, lips, or tongue  -breathing problems  -extreme changes in behavior  -feeling faint or lightheaded, falls  -fever over 102 degrees F  -pain, tingling, numbness in the hands or feet  -redness, blistering, peeling or loosening of the skin, including inside the mouth  -seizures  -unusually weak or tired  Side effects that usually do not require medical attention (report to your doctor or health care professional if they continue or are bothersome):  -aches or pains  -chickenpox-like rash  -diarrhea  -headache  -low-grade fever under 102 degrees F  -loss of appetite  -nausea, vomiting  -redness, pain, swelling at site where injected  -sleepy  -trouble sleeping  This list may not describe all possible side effects. Call your doctor for medical advice about side effects. You may report side effects to FDA at 1-800-FDA-1088.  Where should I keep my medicine?  These drugs are given in a hospital or clinic and will not be stored at home.  NOTE: This sheet is a summary. It may not cover all possible information. If you have questions about this medicine, talk to your doctor, pharmacist, or health care provider.     © 2016, [employer]. (2013-03-24 14:24:35)

## 2015-08-10 LAB — COMPREHENSIVE METABOLIC PANEL
A/G RATIO: 1.7 (ref 1.1–2.5)
ALT: 24 IU/L (ref 0–44)
AST: 19 IU/L (ref 0–40)
Albumin: 4.5 g/dL (ref 3.6–4.8)
Alkaline Phosphatase: 81 IU/L (ref 39–117)
BUN/Creatinine Ratio: 13 (ref 10–22)
BUN: 10 mg/dL (ref 8–27)
Bilirubin Total: 0.4 mg/dL (ref 0.0–1.2)
CALCIUM: 10.4 mg/dL — AB (ref 8.6–10.2)
CO2: 26 mmol/L (ref 18–29)
Chloride: 94 mmol/L — ABNORMAL LOW (ref 96–106)
Creatinine, Ser: 0.77 mg/dL (ref 0.76–1.27)
GFR calc Af Amer: 114 mL/min/{1.73_m2} (ref >59)
GFR, EST NON AFRICAN AMERICAN: 99 mL/min/{1.73_m2} (ref >59)
GLOBULIN, TOTAL: 2.6 g/dL (ref 1.5–4.5)
Glucose: 160 mg/dL — ABNORMAL HIGH (ref 65–99)
POTASSIUM: 4.6 mmol/L (ref 3.5–5.2)
SODIUM: 136 mmol/L (ref 134–144)
Total Protein: 7.1 g/dL (ref 6.0–8.5)

## 2015-08-10 LAB — LIPID PANEL W/O CHOL/HDL RATIO
Cholesterol, Total: 154 mg/dL (ref 100–199)
HDL: 45 mg/dL (ref >39)
LDL Calculated: 76 mg/dL (ref 0–99)
TRIGLYCERIDES: 166 mg/dL — AB (ref 0–149)
VLDL Cholesterol Cal: 33 mg/dL (ref 5–40)

## 2015-08-13 ENCOUNTER — Telehealth: Payer: Self-pay

## 2015-08-13 MED ORDER — DAPAGLIFLOZIN PROPANEDIOL 5 MG PO TABS: 5.0000 mg | ORAL_TABLET | Freq: Every day | ORAL | Status: DC

## 2015-08-13 NOTE — Telephone Encounter (Signed)
Rx changed to [place]. Rx sent to his pharmacy.

## 2015-08-13 NOTE — Telephone Encounter (Signed)
Received a fax for a change in medication Invokana 100 mg is not covered, but farxiga and jardiance are the formulary drug. Can this medication be switched?

## 2015-08-14 ENCOUNTER — Encounter: Payer: Self-pay | Admitting: Family Medicine

## 2015-08-30 ENCOUNTER — Encounter: Payer: Self-pay | Admitting: Family Medicine

## 2015-08-30 ENCOUNTER — Ambulatory Visit (INDEPENDENT_AMBULATORY_CARE_PROVIDER_SITE_OTHER): Payer: Managed Care, Other (non HMO) | Admitting: Family Medicine

## 2015-08-30 VITALS — BP 120/78 | HR 88 | Temp 98.0°F | Ht 68.5 in | Wt 195.0 lb

## 2015-08-30 DIAGNOSIS — H53461 Homonymous bilateral field defects, right side: Secondary | ICD-10-CM | POA: Diagnosis not present

## 2015-08-30 DIAGNOSIS — E1165 Type 2 diabetes mellitus with hyperglycemia: Secondary | ICD-10-CM

## 2015-08-30 DIAGNOSIS — I6930 Unspecified sequelae of cerebral infarction: Secondary | ICD-10-CM | POA: Diagnosis not present

## 2015-08-30 MED ORDER — EMPAGLIFLOZIN 10 MG PO TABS: 10.0000 mg | ORAL_TABLET | Freq: Every day | ORAL | Status: DC

## 2015-08-30 NOTE — Assessment & Plan Note (Signed)
Can't tolerate farxiga. Will change to jardiance. Recheck 1 month. Stop eating the candy.

## 2015-08-30 NOTE — Assessment & Plan Note (Signed)
Stable  No change

## 2015-08-30 NOTE — Progress Notes (Signed)
BP 120/78 mmHg  Pulse 88  Temp(Src) 98 F (36.7 C)  Ht 5' 8.5" (1.74 m)  Wt 195 lb (88.451 kg)  BMI 29.21 kg/m2  SpO2 99%   Subjective:    Patient ID: [name]., male    DOB: March 08, 1956, 60 y.o.   MRN: [id number]  HPI: [name]. is a 60 y.o. male  Chief Complaint  Patient presents with  . Diabetes    Patient states that he has discontinued the invokana due to diarrhea and increased urination.   DIABETES- started on Farxiga last visit due to hyperglycemia. Was not able to tolerate it due to diarrhea and increased urination. He has stopped it. Lost 10 lbs  Hypoglycemic episodes:no Polydipsia/polyuria: no Visual disturbance: yes Chest pain: no Paresthesias: no Glucose Monitoring: yes Taking Insulin?: no Blood Pressure Monitoring: not checking Retinal Examination: Up to Date Foot Exam: Up to Date Diabetic Education: Completed Pneumovax: Up to Date Influenza: Up to Date Aspirin: yes   Working with Chief Executive Officer for disability. Getting close to retirement and is hoping to have enough with it   No changes in his symptoms. Not able to see out of R eye, still having trouble with memory issues.   Relevant past medical, surgical, family and social history reviewed and updated as indicated. Interim medical history since our last visit reviewed. Allergies and medications reviewed and updated.  Review of Systems  Constitutional: Negative.   Respiratory: Negative.   Cardiovascular: Negative.   Gastrointestinal: Negative.   Psychiatric/Behavioral: Negative.     Per HPI unless specifically indicated above     Objective:    BP 120/78 mmHg  Pulse 88  Temp(Src) 98 F (36.7 C)  Ht 5' 8.5" (1.74 m)  Wt 195 lb (88.451 kg)  BMI 29.21 kg/m2  SpO2 99%  Wt Readings from Last 3 Encounters:  08/30/15 195 lb (88.451 kg)  08/09/15 194 lb (87.998 kg)  07/12/15 201 lb (91.173 kg)    Physical Exam  Constitutional: He is oriented to person, place, and time. He appears  well-developed and well-nourished. No distress.  HENT:  Head: Normocephalic and atraumatic.  Right Ear: Hearing normal.  Left Ear: Hearing normal.  Nose: Nose normal.  Eyes: Conjunctivae and lids are normal. Right eye exhibits no discharge. Left eye exhibits no discharge. No scleral icterus.  Unable to see out of the R eye.  Cardiovascular: Normal rate, regular rhythm, normal heart sounds and intact distal pulses.  Exam reveals no gallop and no friction rub.   No murmur heard. Pulmonary/Chest: Effort normal and breath sounds normal. No respiratory distress. He has no wheezes. He exhibits no tenderness.  Musculoskeletal: Normal range of motion.  Neurological: He is alert and oriented to person, place, and time.  Skin: Skin is warm, dry and intact. No rash noted. No erythema. No pallor.  Psychiatric: He has a normal mood and affect. His speech is normal and behavior is normal. Judgment and thought content normal. He exhibits abnormal recent memory and abnormal remote memory.  Nursing note and vitals reviewed.   Results for orders placed or performed in visit on 08/09/15  Comprehensive metabolic panel  Result Value Ref Range   Glucose 160 (H) 65 - 99 mg/dL   BUN 10 8 - 27 mg/dL   Creatinine, Ser 0.77 0.76 - 1.27 mg/dL   GFR calc non Af Amer 99 >59 mL/min/1.73   GFR calc Af Amer 114 >59 mL/min/1.73   BUN/Creatinine Ratio 13 10 - 22  Sodium 136 134 - 144 mmol/L   Potassium 4.6 3.5 - 5.2 mmol/L   Chloride 94 (L) 96 - 106 mmol/L   CO2 26 18 - 29 mmol/L   Calcium 10.4 (H) 8.6 - 10.2 mg/dL   Total Protein 7.1 6.0 - 8.5 g/dL   Albumin 4.5 3.6 - 4.8 g/dL   Globulin, Total 2.6 1.5 - 4.5 g/dL   Albumin/Globulin Ratio 1.7 1.1 - 2.5   Bilirubin Total 0.4 0.0 - 1.2 mg/dL   Alkaline Phosphatase 81 39 - 117 IU/L   AST 19 0 - 40 IU/L   ALT 24 0 - 44 IU/L  Bayer DCA Hb A1c Waived  Result Value Ref Range   Bayer DCA Hb A1c Waived 9.6 (H) <7.0 %  Lipid Panel w/o Chol/HDL Ratio  Result Value  Ref Range   Cholesterol, Total 154 100 - 199 mg/dL   Triglycerides 166 (H) 0 - 149 mg/dL   HDL 45 >39 mg/dL   VLDL Cholesterol Cal 33 5 - 40 mg/dL   LDL Calculated 76 0 - 99 mg/dL      Assessment & Plan:   Problem List Items Addressed This Visit      Endocrine   DM (diabetes mellitus), type 2, uncontrolled ([place]) - Primary    Can't tolerate farxiga. Will change to jardiance. Recheck 1 month. Stop eating the candy.      Relevant Medications   empagliflozin (JARDIANCE) 10 MG TABS tablet     Other   History of stroke with residual deficit    Stable. No change.      Right homonymous hemianopsia    Stable. No change          Follow up plan: Return in about 4 weeks (around 09/27/2015).

## 2015-08-30 NOTE — Patient Instructions (Signed)
Benadryl Unisom Z-Quil Melatonin

## 2015-09-03 ENCOUNTER — Other Ambulatory Visit: Payer: Self-pay | Admitting: Family Medicine

## 2015-09-03 MED ORDER — EMPAGLIFLOZIN 10 MG PO TABS: 10.0000 mg | ORAL_TABLET | Freq: Every day | ORAL | Status: DC

## 2015-09-03 MED ORDER — METFORMIN HCL 1000 MG PO TABS: 1000.0000 mg | ORAL_TABLET | Freq: Two times a day (BID) | ORAL | Status: DC

## 2015-09-06 ENCOUNTER — Ambulatory Visit: Payer: Managed Care, Other (non HMO) | Admitting: Family Medicine

## 2015-09-17 ENCOUNTER — Ambulatory Visit (INDEPENDENT_AMBULATORY_CARE_PROVIDER_SITE_OTHER): Payer: Managed Care, Other (non HMO) | Admitting: Family Medicine

## 2015-09-17 ENCOUNTER — Encounter: Payer: Self-pay | Admitting: Family Medicine

## 2015-09-17 VITALS — BP 137/84 | HR 89 | Temp 98.3°F | Ht 69.6 in | Wt 191.0 lb

## 2015-09-17 DIAGNOSIS — L03031 Cellulitis of right toe: Secondary | ICD-10-CM

## 2015-09-17 MED ORDER — SULFAMETHOXAZOLE-TRIMETHOPRIM 800-160 MG PO TABS: 1.0000 | ORAL_TABLET | Freq: Two times a day (BID) | ORAL | Status: DC

## 2015-09-17 NOTE — Progress Notes (Signed)
BP 137/84 mmHg  Pulse 89  Temp(Src) 98.3 F (36.8 C)  Ht 5' 9.6" (1.768 m)  Wt 191 lb (86.637 kg)  BMI 27.72 kg/m2  SpO2 96%   Subjective:    Patient ID: Glenn Gerold., male    DOB: [birth year], 60 y.o.   MRN: [id number]  HPI: Glenn Macfadyen. is a 60 y.o. male  Chief Complaint  Patient presents with  . Nail Problem    right toes, X 1 week   TOE PAIN Duration: 1 week Involved toe: right2nd and 4th  Mechanism of injury: unknown Onset: sudden Severity: 7/10  Quality: dull ache Frequency: intermittent Radiation: no Aggravating factors: touching it Alleviating factors: rest and elevation  Status: worse Treatments attempted: nothing  Relief with NSAIDs?: mild Morning stiffness: no Redness: yes  Bruising: no Swelling: yes Paresthesias / decreased sensation: no Fevers: no  Relevant past medical, surgical, family and social history reviewed and updated as indicated. Interim medical history since our last visit reviewed. Allergies and medications reviewed and updated.  Review of Systems  Constitutional: Negative.   Respiratory: Negative.   Cardiovascular: Negative.   Skin: Positive for color change. Negative for pallor, rash and wound.  Psychiatric/Behavioral: Negative.     Per HPI unless specifically indicated above     Objective:    BP 137/84 mmHg  Pulse 89  Temp(Src) 98.3 F (36.8 C)  Ht 5' 9.6" (1.768 m)  Wt 191 lb (86.637 kg)  BMI 27.72 kg/m2  SpO2 96%  Wt Readings from Last 3 Encounters:  09/17/15 191 lb (86.637 kg)  08/30/15 195 lb (88.451 kg)  08/09/15 194 lb (87.998 kg)    Physical Exam  Constitutional: He is oriented to person, place, and time. He appears well-developed and well-nourished. No distress.  HENT:  Head: Normocephalic and atraumatic.  Right Ear: Hearing normal.  Left Ear: Hearing normal.  Nose: Nose normal.  Eyes: Conjunctivae and lids are normal. Right eye exhibits no discharge. Left eye exhibits no discharge. No  scleral icterus.  Pulmonary/Chest: Effort normal. No respiratory distress.  Musculoskeletal: Normal range of motion.  Neurological: He is alert and oriented to person, place, and time.  Skin: Skin is warm, dry and intact. No rash noted. There is erythema (around nail of R 4th and 2nd toes). No pallor.  With heat and tender to palpation  Psychiatric: He has a normal mood and affect. His speech is normal and behavior is normal. Cognition and memory are normal.    Results for orders placed or performed in visit on 08/09/15  Comprehensive metabolic panel  Result Value Ref Range   Glucose 160 (H) 65 - 99 mg/dL   BUN 10 8 - 27 mg/dL   Creatinine, Ser 0.77 0.76 - 1.27 mg/dL   GFR calc non Af Amer 99 >59 mL/min/1.73   GFR calc Af Amer 114 >59 mL/min/1.73   BUN/Creatinine Ratio 13 10 - 22   Sodium 136 134 - 144 mmol/L   Potassium 4.6 3.5 - 5.2 mmol/L   Chloride 94 (L) 96 - 106 mmol/L   CO2 26 18 - 29 mmol/L   Calcium 10.4 (H) 8.6 - 10.2 mg/dL   Total Protein 7.1 6.0 - 8.5 g/dL   Albumin 4.5 3.6 - 4.8 g/dL   Globulin, Total 2.6 1.5 - 4.5 g/dL   Albumin/Globulin Ratio 1.7 1.1 - 2.5   Bilirubin Total 0.4 0.0 - 1.2 mg/dL   Alkaline Phosphatase 81 39 - 117 IU/L   AST 19 0 -  40 IU/L   ALT 24 0 - 44 IU/L  Bayer DCA Hb A1c Waived  Result Value Ref Range   Bayer DCA Hb A1c Waived 9.6 (H) <7.0 %  Lipid Panel w/o Chol/HDL Ratio  Result Value Ref Range   Cholesterol, Total 154 100 - 199 mg/dL   Triglycerides 166 (H) 0 - 149 mg/dL   HDL 45 >39 mg/dL   VLDL Cholesterol Cal 33 5 - 40 mg/dL   LDL Calculated 76 0 - 99 mg/dL      Assessment & Plan:   Problem List Items Addressed This Visit    None    Visit Diagnoses    Cellulitis of toe of right foot    -  Primary    Will treat with bactrim. Call with any problems. Follow up with podiatry. Conitnue to monitor.         Follow up plan: Return As scheduled.

## 2015-09-27 ENCOUNTER — Ambulatory Visit (INDEPENDENT_AMBULATORY_CARE_PROVIDER_SITE_OTHER): Payer: Managed Care, Other (non HMO) | Admitting: Family Medicine

## 2015-09-27 ENCOUNTER — Encounter: Payer: Self-pay | Admitting: Family Medicine

## 2015-09-27 VITALS — BP 90/61 | HR 93 | Temp 98.5°F | Ht 69.0 in | Wt 188.0 lb

## 2015-09-27 DIAGNOSIS — H53461 Homonymous bilateral field defects, right side: Secondary | ICD-10-CM | POA: Diagnosis not present

## 2015-09-27 DIAGNOSIS — R413 Other amnesia: Secondary | ICD-10-CM | POA: Diagnosis not present

## 2015-09-27 DIAGNOSIS — I952 Hypotension due to drugs: Secondary | ICD-10-CM

## 2015-09-27 DIAGNOSIS — E1165 Type 2 diabetes mellitus with hyperglycemia: Secondary | ICD-10-CM

## 2015-09-27 DIAGNOSIS — I6930 Unspecified sequelae of cerebral infarction: Secondary | ICD-10-CM

## 2015-09-27 DIAGNOSIS — G44321 Chronic post-traumatic headache, intractable: Secondary | ICD-10-CM

## 2015-09-27 MED ORDER — OXYCODONE HCL ER 10 MG PO T12A: 10.0000 mg | EXTENDED_RELEASE_TABLET | Freq: Two times a day (BID) | ORAL | Status: DC

## 2015-09-27 MED ORDER — LISINOPRIL-HYDROCHLOROTHIAZIDE 10-12.5 MG PO TABS: 0.5000 | ORAL_TABLET | Freq: Every day | ORAL | Status: DC

## 2015-09-27 NOTE — Assessment & Plan Note (Signed)
Unchanged. Unable to go back to work.

## 2015-09-27 NOTE — Assessment & Plan Note (Signed)
Doing well on the jardiance. Continue medication. Recheck A1c in 2 months.

## 2015-09-27 NOTE — Assessment & Plan Note (Signed)
Taking oxy every other day or so. Refill given today.

## 2015-09-27 NOTE — Progress Notes (Signed)
BP 90/61 mmHg  Pulse 93  Temp(Src) 98.5 F (36.9 C)  Ht 5\' 9"  (1.753 m)  Wt 188 lb (85.276 kg)  BMI 27.75 kg/m2  SpO2 97%   Subjective:    Patient ID: Glenn Gerold., male    DOB: [birth year], 60 y.o.   MRN: [id number]  HPI: Glenn Polacek. is a 60 y.o. male  Chief Complaint  Patient presents with  . Diabetes   DIABETES- started on jardiance last visit, here today for follow up to make sure it's doing OK.  Hypoglycemic episodes:no Polydipsia/polyuria: yes Visual disturbance: yes Chest pain: no Paresthesias: no Glucose Monitoring: yes Taking Insulin?: no Blood Pressure Monitoring: not checking Retinal Examination: Up to Date Foot Exam: Up to Date Diabetic Education: Completed Pneumovax: Up to Date Influenza: Up to Date Aspirin: yes  Working with Chief Executive Officer for disability. Getting close to retirement and is hoping to have enough with it   No changes in his symptoms. Not able to see out of R eye, still having trouble with memory issues.   Relevant past medical, surgical, family and social history reviewed and updated as indicated. Interim medical history since our last visit reviewed. Allergies and medications reviewed and updated.  Review of Systems  Constitutional: Negative.   Respiratory: Negative.   Cardiovascular: Negative.   Psychiatric/Behavioral: Negative.     Per HPI unless specifically indicated above     Objective:    BP 90/61 mmHg  Pulse 93  Temp(Src) 98.5 F (36.9 C)  Ht 5\' 9"  (1.753 m)  Wt 188 lb (85.276 kg)  BMI 27.75 kg/m2  SpO2 97%  Wt Readings from Last 3 Encounters:  09/27/15 188 lb (85.276 kg)  09/17/15 191 lb (86.637 kg)  08/30/15 195 lb (88.451 kg)    Physical Exam  Constitutional: He is oriented to person, place, and time. He appears well-developed and well-nourished. No distress.  HENT:  Head: Normocephalic and atraumatic.  Right Ear: Hearing normal.  Left Ear: Hearing normal.  Nose: Nose normal.  Eyes: Conjunctivae,  EOM and lids are normal. Pupils are equal, round, and reactive to light. Right eye exhibits no discharge. Left eye exhibits no discharge. No scleral icterus.  Unable to see out of R half of R eye  Cardiovascular: Normal rate, regular rhythm, normal heart sounds and intact distal pulses.  Exam reveals no gallop and no friction rub.   No murmur heard. Pulmonary/Chest: Effort normal and breath sounds normal. No respiratory distress. He has no wheezes. He has no rales. He exhibits no tenderness.  Musculoskeletal: Normal range of motion.  Neurological: He is alert and oriented to person, place, and time.  Skin: Skin is warm, dry and intact. No rash noted. No erythema. No pallor.  Psychiatric: He has a normal mood and affect. His speech is normal and behavior is normal. Judgment and thought content normal. Cognition and memory are impaired. He exhibits abnormal recent memory and abnormal remote memory.  Nursing note and vitals reviewed.   Results for orders placed or performed in visit on 08/09/15  Comprehensive metabolic panel  Result Value Ref Range   Glucose 160 (H) 65 - 99 mg/dL   BUN 10 8 - 27 mg/dL   Creatinine, Ser 0.77 0.76 - 1.27 mg/dL   GFR calc non Af Amer 99 >59 mL/min/1.73   GFR calc Af Amer 114 >59 mL/min/1.73   BUN/Creatinine Ratio 13 10 - 22   Sodium 136 134 - 144 mmol/L   Potassium 4.6 3.5 - 5.2  mmol/L   Chloride 94 (L) 96 - 106 mmol/L   CO2 26 18 - 29 mmol/L   Calcium 10.4 (H) 8.6 - 10.2 mg/dL   Total Protein 7.1 6.0 - 8.5 g/dL   Albumin 4.5 3.6 - 4.8 g/dL   Globulin, Total 2.6 1.5 - 4.5 g/dL   Albumin/Globulin Ratio 1.7 1.1 - 2.5   Bilirubin Total 0.4 0.0 - 1.2 mg/dL   Alkaline Phosphatase 81 39 - 117 IU/L   AST 19 0 - 40 IU/L   ALT 24 0 - 44 IU/L  Bayer DCA Hb A1c Waived  Result Value Ref Range   Bayer DCA Hb A1c Waived 9.6 (H) <7.0 %  Lipid Panel w/o Chol/HDL Ratio  Result Value Ref Range   Cholesterol, Total 154 100 - 199 mg/dL   Triglycerides 166 (H) 0 - 149  mg/dL   HDL 45 >39 mg/dL   VLDL Cholesterol Cal 33 5 - 40 mg/dL   LDL Calculated 76 0 - 99 mg/dL      Assessment & Plan:   Problem List Items Addressed This Visit      Endocrine   DM (diabetes mellitus), type 2, uncontrolled ([place])    Doing well on the jardiance. Continue medication. Recheck A1c in 2 months.       Relevant Medications   aspirin EC 81 MG tablet   lisinopril-hydrochlorothiazide (PRINZIDE,ZESTORETIC) 10-12.5 MG tablet     Other   Chronic headaches    Taking oxy every other day or so. Refill given today.       Relevant Medications   aspirin EC 81 MG tablet   oxyCODONE (OXYCONTIN) 10 mg 12 hr tablet   History of stroke with residual deficit    Unchanged. Unable to go back to work.       Right homonymous hemianopsia    Unchanged. Unable to go back to work.       Memory loss    Unchanged. Unable to go back to work.        Other Visit Diagnoses    Hypotension due to drugs    -  Primary    Will decrease lisinopril to 1/2 tab daily and recheck in 1 month.     Relevant Medications    aspirin EC 81 MG tablet    lisinopril-hydrochlorothiazide (PRINZIDE,ZESTORETIC) 10-12.5 MG tablet        Follow up plan: Return in about 4 weeks (around 10/25/2015) for follow up disability.

## 2015-10-23 LAB — HM DIABETES EYE EXAM

## 2015-10-24 ENCOUNTER — Encounter: Payer: Self-pay | Admitting: Family Medicine

## 2015-10-24 ENCOUNTER — Ambulatory Visit (INDEPENDENT_AMBULATORY_CARE_PROVIDER_SITE_OTHER): Payer: Managed Care, Other (non HMO) | Admitting: Family Medicine

## 2015-10-24 VITALS — BP 129/75 | HR 96 | Temp 98.5°F | Ht 69.8 in | Wt 188.0 lb

## 2015-10-24 DIAGNOSIS — G44321 Chronic post-traumatic headache, intractable: Secondary | ICD-10-CM

## 2015-10-24 DIAGNOSIS — I6930 Unspecified sequelae of cerebral infarction: Secondary | ICD-10-CM | POA: Diagnosis not present

## 2015-10-24 MED ORDER — OXYCODONE HCL ER 10 MG PO T12A: 10.0000 mg | EXTENDED_RELEASE_TABLET | Freq: Two times a day (BID) | ORAL | Status: DC

## 2015-10-24 NOTE — Assessment & Plan Note (Signed)
Unchanged. Unable to go back to work.

## 2015-10-24 NOTE — Progress Notes (Signed)
BP 129/75 mmHg  Pulse 96  Temp(Src) 98.5 F (36.9 C)  Ht 5' 9.8" (1.773 m)  Wt 188 lb (85.276 kg)  BMI 27.13 kg/m2  SpO2 98%   Subjective:    Patient ID: [name]., male    DOB: 09-09-1955, 60 y.o.   MRN: [id number]  HPI: [name]. is a 60 y.o. male  Chief Complaint  Patient presents with  . disability   Still smoking. Working on cutting down. Now 3/4 pack a day. Medicines didn't help. Still working on cutting back, but not interested in medicine. No changes in his symptoms. Still can't see out of the R side of his eye. Memory still an issue. To have a graft on his R leg due to poor blood flow to his R 4th toe. Following with Dr. Lucky Cowboy and happy with how he's doing. He is working on disability with a Chief Executive Officer and with his job. Hasn't heard anything else. He is otherwise feeling well with no other concerns or complaints at this time.  Would like a refill on his oxycodone for extreme headaches. Does not take it every day, only occasionally. It helps to relieve the pain. No other concerns.   Relevant past medical, surgical, family and social history reviewed and updated as indicated. Interim medical history since our last visit reviewed. Allergies and medications reviewed and updated.  Review of Systems  Constitutional: Negative.   Eyes: Positive for visual disturbance. Negative for photophobia, pain, discharge, redness and itching.  Respiratory: Negative.   Cardiovascular: Negative.   Neurological: Negative.   Psychiatric/Behavioral: Negative.    Per HPI unless specifically indicated above     Objective:    BP 129/75 mmHg  Pulse 96  Temp(Src) 98.5 F (36.9 C)  Ht 5' 9.8" (1.773 m)  Wt 188 lb (85.276 kg)  BMI 27.13 kg/m2  SpO2 98%  Wt Readings from Last 3 Encounters:  10/24/15 188 lb (85.276 kg)  09/27/15 188 lb (85.276 kg)  09/17/15 191 lb (86.637 kg)    Physical Exam  Constitutional: He is oriented to person, place, and time. He appears well-developed  and well-nourished. No distress.  HENT:  Head: Normocephalic and atraumatic.  Right Ear: Hearing normal.  Left Ear: Hearing normal.  Nose: Nose normal.  Eyes: Conjunctivae, EOM and lids are normal. Pupils are equal, round, and reactive to light. Right eye exhibits no discharge. Left eye exhibits no discharge. No scleral icterus.  Unable to see out the R side of his R eye  Cardiovascular: Normal rate, regular rhythm, normal heart sounds and intact distal pulses.  Exam reveals no gallop and no friction rub.   No murmur heard. Pulmonary/Chest: Effort normal and breath sounds normal. No respiratory distress. He has no wheezes. He has no rales. He exhibits no tenderness.  Musculoskeletal: Normal range of motion.  Neurological: He is alert and oriented to person, place, and time. He has normal reflexes. He displays normal reflexes. No cranial nerve deficit. He exhibits normal muscle tone. Coordination normal.  Skin: Skin is warm, dry and intact. No rash noted. There is erythema (R 4th toe). No pallor.  Psychiatric: He has a normal mood and affect. His speech is normal and behavior is normal. Judgment and thought content normal. Cognition and memory are impaired. He exhibits abnormal recent memory and abnormal remote memory.  Nursing note and vitals reviewed.   Results for orders placed or performed in visit on 10/24/15  HM DIABETES EYE EXAM  Result Value Ref  Range   HM Diabetic Eye Exam No Retinopathy No Retinopathy      Assessment & Plan:   Problem List Items Addressed This Visit      Other   Chronic headaches    Taking oxy every other day or so. Refill given today.       Relevant Medications   oxyCODONE (OXYCONTIN) 10 mg 12 hr tablet   History of stroke with residual deficit - Primary    Unchanged. Unable to go back to work.           Follow up plan: Return in about 4 weeks (around 11/21/2015).

## 2015-10-24 NOTE — Assessment & Plan Note (Signed)
Taking oxy every other day or so. Refill given today.

## 2015-11-26 ENCOUNTER — Ambulatory Visit: Payer: Managed Care, Other (non HMO) | Admitting: Family Medicine

## 2015-11-27 ENCOUNTER — Other Ambulatory Visit: Payer: Self-pay | Admitting: Family Medicine

## 2015-11-28 ENCOUNTER — Encounter: Payer: Self-pay | Admitting: Family Medicine

## 2015-11-28 ENCOUNTER — Ambulatory Visit (INDEPENDENT_AMBULATORY_CARE_PROVIDER_SITE_OTHER): Payer: Managed Care, Other (non HMO) | Admitting: Family Medicine

## 2015-11-28 VITALS — BP 115/77 | HR 100 | Temp 98.6°F | Wt 187.0 lb

## 2015-11-28 DIAGNOSIS — G44321 Chronic post-traumatic headache, intractable: Secondary | ICD-10-CM

## 2015-11-28 DIAGNOSIS — I6930 Unspecified sequelae of cerebral infarction: Secondary | ICD-10-CM

## 2015-11-28 MED ORDER — OXYCODONE HCL ER 10 MG PO T12A: 10.0000 mg | EXTENDED_RELEASE_TABLET | Freq: Two times a day (BID) | ORAL | Status: DC

## 2015-11-28 NOTE — Progress Notes (Signed)
BP 115/77 mmHg  Pulse 100  Temp(Src) 98.6 F (37 C)  Wt 187 lb (84.823 kg)  SpO2 96%   Subjective:    Patient ID: Glenn Gerold., male    DOB: Oct 07, 1955, 60 y.o.   MRN: [id number]  HPI: Glenn Anez. is a 60 y.o. male  Chief Complaint  Patient presents with  . Cerebrovascular Accident   Has 6 of his pain medicine pills left. Has been trying to make them last. Has been doing OK. Head still hurting quite a bit since the stroke. No changes in any of his symptoms. Wants to post-pone his 6 month visit 1 month because he has been eating too many sweets. He states that his mood is up and down and some days are better than others. Feeling frustrated as his memory is bad and he can't go back to work. No other concerns.   Relevant past medical, surgical, family and social history reviewed and updated as indicated. Interim medical history since our last visit reviewed. Allergies and medications reviewed and updated.  Review of Systems  Constitutional: Negative.   Eyes: Positive for visual disturbance.  Respiratory: Negative.   Cardiovascular: Negative.   Neurological: Positive for headaches.  Psychiatric/Behavioral: Positive for confusion.    Per HPI unless specifically indicated above     Objective:    BP 115/77 mmHg  Pulse 100  Temp(Src) 98.6 F (37 C)  Wt 187 lb (84.823 kg)  SpO2 96%  Wt Readings from Last 3 Encounters:  11/28/15 187 lb (84.823 kg)  10/24/15 188 lb (85.276 kg)  09/27/15 188 lb (85.276 kg)    Physical Exam  Constitutional: He is oriented to person, place, and time. He appears well-developed and well-nourished. No distress.  HENT:  Head: Normocephalic and atraumatic.  Right Ear: Hearing normal.  Left Ear: Hearing normal.  Nose: Nose normal.  Eyes: Conjunctivae, EOM and lids are normal. Pupils are equal, round, and reactive to light. Right eye exhibits no discharge. Left eye exhibits no discharge. No scleral icterus.  Cardiovascular: Normal  rate, regular rhythm, normal heart sounds and intact distal pulses.  Exam reveals no gallop and no friction rub.   No murmur heard. Pulmonary/Chest: Effort normal. No respiratory distress.  Musculoskeletal: Normal range of motion.  Neurological: He is alert and oriented to person, place, and time.  Skin: Skin is intact. No rash noted.  Psychiatric: He has a normal mood and affect. His speech is normal and behavior is normal. Judgment and thought content normal. Cognition and memory are impaired. He exhibits abnormal recent memory and abnormal remote memory.  Vitals reviewed.   Results for orders placed or performed in visit on 10/24/15  HM DIABETES EYE EXAM  Result Value Ref Range   HM Diabetic Eye Exam No Retinopathy No Retinopathy      Assessment & Plan:   Problem List Items Addressed This Visit      Other   Chronic headaches    Taking oxy every other day or so. Refill given today.       Relevant Medications   oxyCODONE (OXYCONTIN) 10 mg 12 hr tablet   History of stroke with residual deficit - Primary    Unchanged. Unable to go back to work. Forms filled out. To apply for LTD.          Follow up plan: Return in about 4 weeks (around 12/26/2015) for DM/HTN/HLD visit.

## 2015-11-28 NOTE — Assessment & Plan Note (Signed)
Taking oxy every other day or so. Refill given today.

## 2015-11-28 NOTE — Assessment & Plan Note (Signed)
Unchanged. Unable to go back to work. Forms filled out. To apply for LTD.

## 2015-12-26 ENCOUNTER — Encounter: Payer: Self-pay | Admitting: Family Medicine

## 2015-12-26 ENCOUNTER — Ambulatory Visit (INDEPENDENT_AMBULATORY_CARE_PROVIDER_SITE_OTHER): Payer: Managed Care, Other (non HMO) | Admitting: Family Medicine

## 2015-12-26 VITALS — BP 114/77 | HR 94 | Temp 99.0°F | Wt 185.0 lb

## 2015-12-26 DIAGNOSIS — E785 Hyperlipidemia, unspecified: Secondary | ICD-10-CM | POA: Diagnosis not present

## 2015-12-26 DIAGNOSIS — Z125 Encounter for screening for malignant neoplasm of prostate: Secondary | ICD-10-CM

## 2015-12-26 DIAGNOSIS — R413 Other amnesia: Secondary | ICD-10-CM

## 2015-12-26 DIAGNOSIS — E1165 Type 2 diabetes mellitus with hyperglycemia: Secondary | ICD-10-CM

## 2015-12-26 DIAGNOSIS — I6930 Unspecified sequelae of cerebral infarction: Secondary | ICD-10-CM | POA: Diagnosis not present

## 2015-12-26 DIAGNOSIS — I10 Essential (primary) hypertension: Secondary | ICD-10-CM

## 2015-12-26 LAB — BAYER DCA HB A1C WAIVED: HB A1C: 6.9 % (ref <7.0)

## 2015-12-26 LAB — LIPID PANEL PICCOLO, WAIVED
CHOL/HDL RATIO PICCOLO,WAIVE: 3 mg/dL
CHOLESTEROL PICCOLO, WAIVED: 151 mg/dL (ref <200)
HDL Chol Piccolo, Waived: 50 mg/dL — ABNORMAL LOW (ref >59)
LDL CHOL CALC PICCOLO WAIVED: 76 mg/dL (ref <100)
Triglycerides Piccolo,Waived: 124 mg/dL (ref <150)
VLDL CHOL CALC PICCOLO,WAIVE: 25 mg/dL (ref <30)

## 2015-12-26 NOTE — Assessment & Plan Note (Signed)
No change. Applying to disability. Call with any concerns.

## 2015-12-26 NOTE — Assessment & Plan Note (Signed)
Under good control! A1c down to 6.9! Continue current regimen. Continue to monitor.

## 2015-12-26 NOTE — Assessment & Plan Note (Signed)
Under good control. Continue current regimen. Continue to monitor.  

## 2015-12-26 NOTE — Progress Notes (Signed)
BP 114/77 mmHg  Pulse 94  Temp(Src) 99 F (37.2 C)  Wt 185 lb (83.915 kg)  SpO2 97%   Subjective:    Patient ID: Glenn Gerold., male    DOB: Dec 10, 1955, 60 y.o.   MRN: [id number]  HPI: [name]. is a 60 y.o. male  Chief Complaint  Patient presents with  . Diabetes  . Hypertension  . Hyperlipidemia   DIABETES Hypoglycemic episodes:no Polydipsia/polyuria: no Visual disturbance: yes Chest pain: no Paresthesias: no Glucose Monitoring: no  Accucheck frequency: Not Checking Taking Insulin?: no Blood Pressure Monitoring: not checking Retinal Examination: Up to Date Foot Exam: Up to Date Diabetic Education: Completed Pneumovax: Up to Date Influenza: Up to Date Aspirin: no  HYPERTENSION / HYPERLIPIDEMIA Satisfied with current treatment? yes Duration of hypertension: chronic BP monitoring frequency: not checking BP medication side effects: no Duration of hyperlipidemia: chronic Cholesterol medication side effects: no Cholesterol supplements: none Medication compliance: excellent compliance Aspirin: yes Recent stressors: no Recurrent headaches: no Visual changes: no Palpitations: no Dyspnea: no Chest pain: no Lower extremity edema: no Dizzy/lightheaded: no  Taking the pain medicine about 1/2 the time. Trying to only take it when he needs it.   Relevant past medical, surgical, family and social history reviewed and updated as indicated. Interim medical history since our last visit reviewed. Allergies and medications reviewed and updated.  Review of Systems  Constitutional: Negative.   Respiratory: Negative.   Cardiovascular: Negative.   Gastrointestinal: Positive for nausea, vomiting and diarrhea. Negative for abdominal pain, constipation, blood in stool, abdominal distention, anal bleeding and rectal pain.       Until yesterday. Better now   Psychiatric/Behavioral: Negative.     Per HPI unless specifically indicated above     Objective:     BP 114/77 mmHg  Pulse 94  Temp(Src) 99 F (37.2 C)  Wt 185 lb (83.915 kg)  SpO2 97%  Wt Readings from Last 3 Encounters:  12/26/15 185 lb (83.915 kg)  11/28/15 187 lb (84.823 kg)  10/24/15 188 lb (85.276 kg)    Physical Exam  Constitutional: He is oriented to person, place, and time. He appears well-developed and well-nourished. No distress.  HENT:  Head: Normocephalic and atraumatic.  Right Ear: Hearing normal.  Left Ear: Hearing normal.  Nose: Nose normal.  Eyes: Conjunctivae and lids are normal. Right eye exhibits no discharge. Left eye exhibits no discharge. No scleral icterus.  Pulmonary/Chest: Effort normal. No respiratory distress.  Musculoskeletal: Normal range of motion.  Neurological: He is alert and oriented to person, place, and time.  Skin: Skin is intact. No rash noted.  Psychiatric: He has a normal mood and affect. His speech is normal and behavior is normal. Judgment and thought content normal. Cognition and memory are normal.    Results for orders placed or performed in visit on 10/24/15  HM DIABETES EYE EXAM  Result Value Ref Range   HM Diabetic Eye Exam No Retinopathy No Retinopathy      Assessment & Plan:   Problem List Items Addressed This Visit      Cardiovascular and Mediastinum   HTN (hypertension)    Under good control. Continue current regimen. Continue to monitor.       Relevant Orders   CBC with Differential/Platelet   Comprehensive metabolic panel   TSH     Endocrine   DM (diabetes mellitus), type 2, uncontrolled ([place]) - Primary    Under good control! A1c down to 6.9! Continue current regimen.  Continue to monitor.       Relevant Orders   Bayer DCA Hb A1c Waived   CBC with Differential/Platelet   Comprehensive metabolic panel     Other   Hyperlipidemia    Under good control. Continue current regimen. Continue to monitor.       Relevant Orders   CBC with Differential/Platelet   Comprehensive metabolic panel   Lipid Panel  [place], Waived   History of stroke with residual deficit    No change. Applying to disability. Call with any concerns.       Relevant Orders   CBC with Differential/Platelet   Comprehensive metabolic panel   Memory loss    No change. Applying to disability. Call with any concerns.       Relevant Orders   CBC with Differential/Platelet   Comprehensive metabolic panel   TSH    Other Visit Diagnoses    Screening for prostate cancer        Checking labs today. Call with any concerns.     Relevant Orders    PSA        Follow up plan: Return in about 4 weeks (around 01/23/2016).

## 2015-12-27 ENCOUNTER — Encounter: Payer: Self-pay | Admitting: Family Medicine

## 2015-12-27 LAB — CBC WITH DIFFERENTIAL/PLATELET
BASOS: 1 %
Basophils Absolute: 0 10*3/uL (ref 0.0–0.2)
EOS (ABSOLUTE): 0.4 10*3/uL (ref 0.0–0.4)
EOS: 5 %
HEMATOCRIT: 46.5 % (ref 37.5–51.0)
Hemoglobin: 16 g/dL (ref 12.6–17.7)
IMMATURE GRANS (ABS): 0 10*3/uL (ref 0.0–0.1)
IMMATURE GRANULOCYTES: 0 %
LYMPHS: 29 %
Lymphocytes Absolute: 2.3 10*3/uL (ref 0.7–3.1)
MCH: 30 pg (ref 26.6–33.0)
MCHC: 34.4 g/dL (ref 31.5–35.7)
MCV: 87 fL (ref 79–97)
MONOS ABS: 0.5 10*3/uL (ref 0.1–0.9)
Monocytes: 7 %
NEUTROS ABS: 4.6 10*3/uL (ref 1.4–7.0)
NEUTROS PCT: 58 %
PLATELETS: 313 10*3/uL (ref 150–379)
RBC: 5.34 x10E6/uL (ref 4.14–5.80)
RDW: 13.9 % (ref 12.3–15.4)
WBC: 7.9 10*3/uL (ref 3.4–10.8)

## 2015-12-27 LAB — PSA: Prostate Specific Ag, Serum: 0.5 ng/mL (ref 0.0–4.0)

## 2015-12-27 LAB — COMPREHENSIVE METABOLIC PANEL
A/G RATIO: 1.7 (ref 1.2–2.2)
ALBUMIN: 4.6 g/dL (ref 3.6–4.8)
ALT: 15 IU/L (ref 0–44)
AST: 17 IU/L (ref 0–40)
Alkaline Phosphatase: 86 IU/L (ref 39–117)
BILIRUBIN TOTAL: 0.5 mg/dL (ref 0.0–1.2)
BUN/Creatinine Ratio: 11 (ref 10–24)
BUN: 8 mg/dL (ref 8–27)
CALCIUM: 10.5 mg/dL — AB (ref 8.6–10.2)
CHLORIDE: 97 mmol/L (ref 96–106)
CO2: 24 mmol/L (ref 18–29)
CREATININE: 0.73 mg/dL — AB (ref 0.76–1.27)
GFR, EST AFRICAN AMERICAN: 116 mL/min/{1.73_m2} (ref >59)
GFR, EST NON AFRICAN AMERICAN: 100 mL/min/{1.73_m2} (ref >59)
GLOBULIN, TOTAL: 2.7 g/dL (ref 1.5–4.5)
Glucose: 132 mg/dL — ABNORMAL HIGH (ref 65–99)
Potassium: 5 mmol/L (ref 3.5–5.2)
Sodium: 141 mmol/L (ref 134–144)
TOTAL PROTEIN: 7.3 g/dL (ref 6.0–8.5)

## 2015-12-27 LAB — TSH: TSH: 1.52 u[IU]/mL (ref 0.450–4.500)

## 2016-01-03 ENCOUNTER — Ambulatory Visit (INDEPENDENT_AMBULATORY_CARE_PROVIDER_SITE_OTHER): Payer: Managed Care, Other (non HMO) | Admitting: Sports Medicine

## 2016-01-03 ENCOUNTER — Encounter: Payer: Self-pay | Admitting: Sports Medicine

## 2016-01-03 DIAGNOSIS — M79671 Pain in right foot: Secondary | ICD-10-CM

## 2016-01-03 DIAGNOSIS — Z8673 Personal history of transient ischemic attack (TIA), and cerebral infarction without residual deficits: Secondary | ICD-10-CM | POA: Diagnosis not present

## 2016-01-03 DIAGNOSIS — I639 Cerebral infarction, unspecified: Secondary | ICD-10-CM | POA: Insufficient documentation

## 2016-01-03 DIAGNOSIS — I70209 Unspecified atherosclerosis of native arteries of extremities, unspecified extremity: Secondary | ICD-10-CM

## 2016-01-03 DIAGNOSIS — E1142 Type 2 diabetes mellitus with diabetic polyneuropathy: Secondary | ICD-10-CM

## 2016-01-03 DIAGNOSIS — R58 Hemorrhage, not elsewhere classified: Secondary | ICD-10-CM

## 2016-01-03 DIAGNOSIS — I10 Essential (primary) hypertension: Secondary | ICD-10-CM | POA: Insufficient documentation

## 2016-01-03 DIAGNOSIS — L853 Xerosis cutis: Secondary | ICD-10-CM

## 2016-01-03 DIAGNOSIS — M79672 Pain in left foot: Secondary | ICD-10-CM

## 2016-01-03 DIAGNOSIS — I739 Peripheral vascular disease, unspecified: Secondary | ICD-10-CM

## 2016-01-03 DIAGNOSIS — E785 Hyperlipidemia, unspecified: Secondary | ICD-10-CM | POA: Insufficient documentation

## 2016-01-03 DIAGNOSIS — E1151 Type 2 diabetes mellitus with diabetic peripheral angiopathy without gangrene: Secondary | ICD-10-CM | POA: Insufficient documentation

## 2016-01-03 NOTE — Patient Instructions (Signed)
Diabetes and Foot Care Diabetes may cause you to have problems because of poor blood supply (circulation) to your feet and legs. This may cause the skin on your feet to become thinner, break easier, and heal more slowly. Your skin may become dry, and the skin may peel and crack. You may also have nerve damage in your legs and feet causing decreased feeling in them. You may not notice minor injuries to your feet that could lead to infections or more serious problems. Taking care of your feet is one of the most important things you can do for yourself.  HOME CARE INSTRUCTIONS  Wear shoes at all times, even in the house. Do not go barefoot. Bare feet are easily injured.  Check your feet daily for blisters, cuts, and redness. If you cannot see the bottom of your feet, use a mirror or ask someone for help.  Wash your feet with warm water (do not use hot water) and mild soap. Then pat your feet and the areas between your toes until they are completely dry. Do not soak your feet as this can dry your skin.  Apply a moisturizing lotion or petroleum jelly (that does not contain alcohol and is unscented) to the skin on your feet and to dry, brittle toenails. Do not apply lotion between your toes.  Trim your toenails straight across. Do not dig under them or around the cuticle. File the edges of your nails with an emery board or nail file.  Do not cut corns or calluses or try to remove them with medicine.  Wear clean socks or stockings every day. Make sure they are not too tight. Do not wear knee-high stockings since they may decrease blood flow to your legs.  Wear shoes that fit properly and have enough cushioning. To break in new shoes, wear them for just a few hours a day. This prevents you from injuring your feet. Always look in your shoes before you put them on to be sure there are no objects inside.  Do not cross your legs. This may decrease the blood flow to your feet.  If you find a minor scrape,  cut, or break in the skin on your feet, keep it and the skin around it clean and dry. These areas may be cleansed with mild soap and water. Do not cleanse the area with peroxide, alcohol, or iodine.  When you remove an adhesive bandage, be sure not to damage the skin around it.  If you have a wound, look at it several times a day to make sure it is healing.  Do not use heating pads or hot water bottles. They may burn your skin. If you have lost feeling in your feet or legs, you may not know it is happening until it is too late.  Make sure your health care provider performs a complete foot exam at least annually or more often if you have foot problems. Report any cuts, sores, or bruises to your health care provider immediately. SEEK MEDICAL CARE IF:   You have an injury that is not healing.  You have cuts or breaks in the skin.  You have an ingrown nail.  You notice redness on your legs or feet.  You feel burning or tingling in your legs or feet.  You have pain or cramps in your legs and feet.  Your legs or feet are numb.  Your feet always feel cold. SEEK IMMEDIATE MEDICAL CARE IF:   There is increasing redness,   swelling, or pain in or around a wound.  There is a red line that goes up your leg.  Pus is coming from a wound.  You develop a fever or as directed by your health care provider.  You notice a bad smell coming from an ulcer or wound.   This information is not intended to replace advice given to you by your health care provider. Make sure you discuss any questions you have with your health care provider.   Document Released: 07/17/2000 Document Revised: 03/22/2013 Document Reviewed: 12/27/2012 Elsevier Interactive Patient Education 2016 [employer] Inc[employer]  

## 2016-01-03 NOTE — Progress Notes (Signed)
Patient ID: Glenn Gerold., male   DOB: [birth year], 60 y.o.   MRN: [id number] Subjective: Glenn Cronan. is a 60 y.o. male patient with history of diabetes and stroke on Plavix who presents to office today for evaluation of dark spots on right foot that showed up after his stroke; reports that he was told that it was blood under the skin and will get better with time. Patient states that the glucose reading this morning was not recorded but A1c was around 6.4. Patient denies any new changes in medication or new problems. Patient denies any new cramping, numbness, burning or tingling in the legs.  Patient Active Problem List   Diagnosis Date Noted  . Controlled type 2 diabetes mellitus without complication ([place]) 99991111  . HLD (hyperlipidemia) 01/03/2016  . BP (high blood pressure) 01/03/2016  . Cerebrovascular accident (CVA) ([place]) 01/03/2016  . Memory loss 09/27/2015  . 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 Diabetic peripheral neuropathy ([place]) 05/16/2015  . History of stroke with residual deficit 04/01/2015  . Chronic headaches 03/15/2015  . Ischemic stroke ([place]) 02/17/2015  . Tobacco abuse 01/24/2015  . DM (diabetes mellitus), type 2, uncontrolled ([place]) 01/21/2015  . Hyperlipidemia 01/21/2015  . HTN (hypertension) 01/21/2015  . Sudden loss of vision 01/21/2015   Current Outpatient Prescriptions on File Prior to Visit  Medication Sig Dispense Refill  . aspirin EC 81 MG tablet Take 81 mg by mouth daily.    . clopidogrel (PLAVIX) 75 MG tablet TAKE 1 (ONE) TABLET PO BY MOUTH DAILY  3  . empagliflozin (JARDIANCE) 10 MG TABS tablet Take 10 mg by mouth daily. (Patient taking differently: Take 5 mg by mouth daily. ) 90 tablet 1  . lisinopril-hydrochlorothiazide (PRINZIDE,ZESTORETIC) 10-12.5 MG tablet TAKE 1 TABLET BY MOUTH DAILY. 90 tablet 1  . metFORMIN (GLUCOPHAGE) 1000 MG tablet Take 1 tablet (1,000 mg total) by mouth 2 (two)  times daily with a meal. 180 tablet 1  . Multiple Vitamins-Minerals (MULTIVITAMIN ADULT PO) Take 1 tablet by mouth daily.    Marland Kitchen oxyCODONE (OXYCONTIN) 10 mg 12 hr tablet Take 1 tablet (10 mg total) by mouth every 12 (twelve) hours. 60 tablet 0  . simvastatin (ZOCOR) 40 MG tablet TAKE 1 TABLET (40 MG TOTAL) BY MOUTH AT BEDTIME. 90 tablet 1   No current facility-administered medications on file prior to visit.   Allergies  Allergen Reactions  . Lipitor [Atorvastatin] Rash  . Gabapentin Other (See Comments)    Other reaction(s): Dizziness    Recent Results (from the past 2160 hour(s))  HM DIABETES EYE EXAM     Status: None   Collection Time: 10/23/15  3:27 PM  Result Value Ref Range   HM Diabetic Eye Exam No Retinopathy No Retinopathy  Bayer DCA Hb A1c Waived     Status: None   Collection Time: 12/26/15  3:59 PM  Result Value Ref Range   Bayer DCA Hb A1c Waived 6.9 <7.0 %    Comment:                                       Diabetic Adult            <7.0  Healthy Adult        4.3 - 5.7                                                           (DCCT/NGSP) American Diabetes Association's Summary of Glycemic Recommendations for Adults with Diabetes: Hemoglobin A1c <7.0%. More stringent glycemic goals (A1c <6.0%) may further reduce complications at the cost of increased risk of hypoglycemia.   Lipid Panel Piccolo, [place]     Status: Abnormal   Collection Time: 12/26/15  3:59 PM  Result Value Ref Range   Cholesterol Piccolo, Waived 151 <200 mg/dL    Comment:                         Desirable                <200                         Borderline High      200- 239                         High                     >239    HDL Chol Piccolo, Waived 50 (L) >59 mg/dL    Comment:                         Low HDL- Risk Factor     < 40                         High HDL- Negative       > 59                          Risk Factor (Desirable)    Triglycerides  Piccolo,Waived 124 <150 mg/dL    Comment:                         Normal                   <150                         Borderline High     150 - 199                         High                200 - 499                         Very High                >499    Chol/HDL Ratio Piccolo,Waive 3.0 mg/dL    Comment:                                        Male  Male                         Low Risk      < 5.0      < 4.5                         High Risk     > 4.9      > 4.4    LDL Chol Calc Piccolo Waived 76 <100 mg/dL    Comment:                         Optimal                  <100                         Near Optimal        100 - 129                         Borderline High     130 - 159                         High                160 - 189                         Very High                >189    VLDL Chol Calc Piccolo,Waive 25 <30 mg/dL    Comment:                         Normal                   < 30                         High                     > 29   CBC with Differential/Platelet     Status: None   Collection Time: 12/26/15  4:00 PM  Result Value Ref Range   WBC 7.9 3.4 - 10.8 x10E3/uL   RBC 5.34 4.14 - 5.80 x10E6/uL   Hemoglobin 16.0 12.6 - 17.7 g/dL   Hematocrit 46.5 37.5 - 51.0 %   MCV 87 79 - 97 fL   MCH 30.0 26.6 - 33.0 pg   MCHC 34.4 31.5 - 35.7 g/dL   RDW 13.9 12.3 - 15.4 %   Platelets 313 150 - 379 x10E3/uL   Neutrophils 58 %   Lymphs 29 %   Monocytes 7 %   Eos 5 %   Basos 1 %   Neutrophils Absolute 4.6 1.4 - 7.0 x10E3/uL   Lymphocytes Absolute 2.3 0.7 - 3.1 x10E3/uL   Monocytes Absolute 0.5 0.1 - 0.9 x10E3/uL   EOS (ABSOLUTE) 0.4 0.0 - 0.4 x10E3/uL   Basophils Absolute 0.0 0.0 - 0.2 x10E3/uL   Immature Granulocytes 0 %   Immature Grans (Abs) 0.0 0.0 - 0.1 x10E3/uL  Comprehensive metabolic panel     Status: Abnormal   Collection Time: 12/26/15  4:00 PM  Result  Value Ref Range   Glucose 132 (H) 65 - 99 mg/dL   BUN 8 8 - 27 mg/dL   Creatinine, Ser  0.73 (L) 0.76 - 1.27 mg/dL   GFR calc non Af Amer 100 >59 mL/min/1.73   GFR calc Af Amer 116 >59 mL/min/1.73   BUN/Creatinine Ratio 11 10 - 24   Sodium 141 134 - 144 mmol/L   Potassium 5.0 3.5 - 5.2 mmol/L   Chloride 97 96 - 106 mmol/L   CO2 24 18 - 29 mmol/L   Calcium 10.5 (H) 8.6 - 10.2 mg/dL   Total Protein 7.3 6.0 - 8.5 g/dL   Albumin 4.6 3.6 - 4.8 g/dL   Globulin, Total 2.7 1.5 - 4.5 g/dL   Albumin/Globulin Ratio 1.7 1.2 - 2.2   Bilirubin Total 0.5 0.0 - 1.2 mg/dL   Alkaline Phosphatase 86 39 - 117 IU/L   AST 17 0 - 40 IU/L   ALT 15 0 - 44 IU/L  TSH     Status: None   Collection Time: 12/26/15  4:00 PM  Result Value Ref Range   TSH 1.520 0.450 - 4.500 uIU/mL  PSA     Status: None   Collection Time: 12/26/15  4:00 PM  Result Value Ref Range   Prostate Specific Ag, Serum 0.5 0.0 - 4.0 ng/mL    Comment: Roche ECLIA methodology. According to the American Urological Association, Serum PSA should decrease and remain at undetectable levels after radical prostatectomy. The AUA defines biochemical recurrence as an initial PSA value 0.2 ng/mL or greater followed by a subsequent confirmatory PSA value 0.2 ng/mL or greater. Values obtained with different assay methods or kits cannot be used interchangeably. Results cannot be interpreted as absolute evidence of the presence or absence of malignant disease.     Objective: General: Patient is awake, alert, and oriented x 3 and in no acute distress.  Integument: Skin is warm, dry and supple bilateral. Nails are short, thickened and  dystrophic with subungual debris, consistent with onychomycosis, 1-5 bilateral. Focal ecchymosis right 5th lateral MTPJ. No signs of infection. No open lesions or preulcerative lesions present bilateral. Remaining integument unremarkable.  Vasculature:  Dorsalis Pedis pulse 1/4 bilateral. Posterior Tibial pulse  1/4 bilateral.  Capillary fill time <5 sec 1-5 bilateral. Diminished hair growth to the level  of the digits. Temperature gradient within normal limits. Mild varicosities present bilateral. Trace edema present bilateral.   Neurology: The patient has absent sensation measured with a 5.07/10g Semmes Weinstein Monofilament at all pedal sites bilateral . Vibratory sensation diminished bilateral with tuning fork. No Babinski sign present bilateral.   Musculoskeletal:Minimal tenderness to Right 5th MTPJ. Muscular strength 5/5 in all lower extremity muscular groups bilateral without pain on range of motion . No tenderness with calf compression bilateral.  Assessment and Plan: Problem List Items Addressed This Visit    None    Visit Diagnoses    Ecchymosis    -  Primary    Focal right foot    Diabetic polyneuropathy associated with type 2 diabetes mellitus ([name])        Dry skin        Foot pain, bilateral        PVD (peripheral vascular disease) ([place])        History of stroke          -Examined patient. -Discussed and educated patient on diabetic foot care, especially with  regards to the vascular, neurological and musculoskeletal systems.  -Stressed the importance  of good glycemic control and the detriment of not  controlling glucose levels in relation to the foot. -Applied offloading pad to Right 5th MTPJ and advised patient to do the same and to refrain from ill-fitting shoes that could worsening the area -Recommend daily skin emollients -Answered all patient questions -Patient to return  in 5 weeks for at risk foot care -Patient advised to call the office if any problems or questions arise in the meantime.  Landis Martins, DPM

## 2016-01-27 ENCOUNTER — Ambulatory Visit (INDEPENDENT_AMBULATORY_CARE_PROVIDER_SITE_OTHER): Payer: Managed Care, Other (non HMO) | Admitting: Family Medicine

## 2016-01-27 ENCOUNTER — Encounter: Payer: Self-pay | Admitting: Family Medicine

## 2016-01-27 VITALS — BP 118/76 | HR 97 | Temp 98.6°F | Ht 70.4 in | Wt 187.0 lb

## 2016-01-27 DIAGNOSIS — I6930 Unspecified sequelae of cerebral infarction: Secondary | ICD-10-CM

## 2016-01-27 DIAGNOSIS — G44321 Chronic post-traumatic headache, intractable: Secondary | ICD-10-CM

## 2016-01-27 DIAGNOSIS — H53461 Homonymous bilateral field defects, right side: Secondary | ICD-10-CM

## 2016-01-27 DIAGNOSIS — R413 Other amnesia: Secondary | ICD-10-CM

## 2016-01-27 MED ORDER — OXYCODONE HCL ER 10 MG PO T12A: 10.0000 mg | EXTENDED_RELEASE_TABLET | Freq: Two times a day (BID) | ORAL | Status: DC

## 2016-01-27 MED ORDER — METFORMIN HCL 1000 MG PO TABS: 1000.0000 mg | ORAL_TABLET | Freq: Two times a day (BID) | ORAL | Status: DC

## 2016-01-27 NOTE — Progress Notes (Signed)
BP 118/76 mmHg  Pulse 97  Temp(Src) 98.6 F (37 C)  Ht 5' 10.4" (1.788 m)  Wt 187 lb (84.823 kg)  BMI 26.53 kg/m2  SpO2 96%   Subjective:    Patient ID: [name]., male    DOB: 1956-07-18, 60 y.o.   MRN: [id number]  HPI: [name]. is a 60 y.o. male  Chief Complaint  Patient presents with  . rx refill    Patients wife is requesting a 90day supply of metformin, she would like for it to be printed   [name] states that he's doing well. Has been making his pain medicine for his headaches last. Running out now and needs a refill. Headaches still happen often, but needing the medicine occasionally. Seeing disability doctor next month for 1st interview to see if he can get disability. Nothing has changed. He's in good spirits. No other concerns or complaints at this time.   Relevant past medical, surgical, family and social history reviewed and updated as indicated. Interim medical history since our last visit reviewed. Allergies and medications reviewed and updated.  Review of Systems  Constitutional: Negative.   Respiratory: Negative.   Cardiovascular: Negative.   Neurological: Positive for headaches.  Psychiatric/Behavioral: Negative.    Per HPI unless specifically indicated above    Objective:    BP 118/76 mmHg  Pulse 97  Temp(Src) 98.6 F (37 C)  Ht 5' 10.4" (1.788 m)  Wt 187 lb (84.823 kg)  BMI 26.53 kg/m2  SpO2 96%  Wt Readings from Last 3 Encounters:  01/27/16 187 lb (84.823 kg)  12/26/15 185 lb (83.915 kg)  11/28/15 187 lb (84.823 kg)    Physical Exam  Constitutional: He is oriented to person, place, and time. He appears well-developed and well-nourished. No distress.  HENT:  Head: Normocephalic and atraumatic.  Right Ear: Hearing normal.  Left Ear: Hearing normal.  Nose: Nose normal.  Eyes: Conjunctivae and lids are normal. Right eye exhibits no discharge. Left eye exhibits no discharge. No scleral icterus.  Cannot see out of R side of R eye   Cardiovascular: Normal rate, regular rhythm, normal heart sounds and intact distal pulses.  Exam reveals no gallop and no friction rub.   No murmur heard. Pulmonary/Chest: Effort normal and breath sounds normal. No respiratory distress. He has no wheezes. He has no rales. He exhibits no tenderness.  Musculoskeletal: Normal range of motion.  Neurological: He is alert and oriented to person, place, and time.  Skin: Skin is warm, dry and intact. No rash noted. He is not diaphoretic. No erythema. No pallor.  Psychiatric: He has a normal mood and affect. His speech is normal and behavior is normal. Judgment and thought content normal. Cognition and memory are impaired. He exhibits abnormal recent memory and abnormal remote memory.  Nursing note and vitals reviewed.   Results for orders placed or performed in visit on 12/26/15  Bayer DCA Hb A1c Waived  Result Value Ref Range   Bayer DCA Hb A1c Waived 6.9 <7.0 %  CBC with Differential/Platelet  Result Value Ref Range   WBC 7.9 3.4 - 10.8 x10E3/uL   RBC 5.34 4.14 - 5.80 x10E6/uL   Hemoglobin 16.0 12.6 - 17.7 g/dL   Hematocrit 46.5 37.5 - 51.0 %   MCV 87 79 - 97 fL   MCH 30.0 26.6 - 33.0 pg   MCHC 34.4 31.5 - 35.7 g/dL   RDW 13.9 12.3 - 15.4 %   Platelets 313 150 -  379 x10E3/uL   Neutrophils 58 %   Lymphs 29 %   Monocytes 7 %   Eos 5 %   Basos 1 %   Neutrophils Absolute 4.6 1.4 - 7.0 x10E3/uL   Lymphocytes Absolute 2.3 0.7 - 3.1 x10E3/uL   Monocytes Absolute 0.5 0.1 - 0.9 x10E3/uL   EOS (ABSOLUTE) 0.4 0.0 - 0.4 x10E3/uL   Basophils Absolute 0.0 0.0 - 0.2 x10E3/uL   Immature Granulocytes 0 %   Immature Grans (Abs) 0.0 0.0 - 0.1 x10E3/uL  Comprehensive metabolic panel  Result Value Ref Range   Glucose 132 (H) 65 - 99 mg/dL   BUN 8 8 - 27 mg/dL   Creatinine, Ser 0.73 (L) 0.76 - 1.27 mg/dL   GFR calc non Af Amer 100 >59 mL/min/1.73   GFR calc Af Amer 116 >59 mL/min/1.73   BUN/Creatinine Ratio 11 10 - 24   Sodium 141 134 - 144 mmol/L    Potassium 5.0 3.5 - 5.2 mmol/L   Chloride 97 96 - 106 mmol/L   CO2 24 18 - 29 mmol/L   Calcium 10.5 (H) 8.6 - 10.2 mg/dL   Total Protein 7.3 6.0 - 8.5 g/dL   Albumin 4.6 3.6 - 4.8 g/dL   Globulin, Total 2.7 1.5 - 4.5 g/dL   Albumin/Globulin Ratio 1.7 1.2 - 2.2   Bilirubin Total 0.5 0.0 - 1.2 mg/dL   Alkaline Phosphatase 86 39 - 117 IU/L   AST 17 0 - 40 IU/L   ALT 15 0 - 44 IU/L  Lipid Panel Piccolo, Waived  Result Value Ref Range   Cholesterol Piccolo, Waived 151 <200 mg/dL   HDL Chol Piccolo, Waived 50 (L) >59 mg/dL   Triglycerides Piccolo,Waived 124 <150 mg/dL   Chol/HDL Ratio Piccolo,Waive 3.0 mg/dL   LDL Chol Calc Piccolo Waived 76 <100 mg/dL   VLDL Chol Calc Piccolo,Waive 25 <30 mg/dL  TSH  Result Value Ref Range   TSH 1.520 0.450 - 4.500 uIU/mL  PSA  Result Value Ref Range   Prostate Specific Ag, Serum 0.5 0.0 - 4.0 ng/mL      Assessment & Plan:   Problem List Items Addressed This Visit      Other   Chronic headaches    Made his oxycodone last. Rx given today. Will try to make it last for another 2 months.       Relevant Medications   oxyCODONE (OXYCONTIN) 10 mg 12 hr tablet   History of stroke with residual deficit - Primary    No change. To see disability next month. Call with concerns.       Right homonymous hemianopsia    No better. To see disability next month. Continue to monitor      Memory loss    No better. To see disability next month. Continue to monitor          Follow up plan: Return in about 6 weeks (around 03/09/2016) for Follow up memory.

## 2016-01-27 NOTE — Assessment & Plan Note (Signed)
No better. To see disability next month. Continue to monitor

## 2016-01-27 NOTE — Assessment & Plan Note (Signed)
Made his oxycodone last. Rx given today. Will try to make it last for another 2 months.

## 2016-01-27 NOTE — Assessment & Plan Note (Addendum)
No change. To see disability next month. Call with concerns.

## 2016-02-14 ENCOUNTER — Ambulatory Visit: Payer: Managed Care, Other (non HMO) | Admitting: Sports Medicine

## 2016-02-23 ENCOUNTER — Other Ambulatory Visit: Payer: Self-pay | Admitting: Family Medicine

## 2016-02-24 NOTE — Telephone Encounter (Signed)
Your patient.  Thanks 

## 2016-03-09 ENCOUNTER — Encounter: Payer: Self-pay | Admitting: Family Medicine

## 2016-03-09 ENCOUNTER — Ambulatory Visit (INDEPENDENT_AMBULATORY_CARE_PROVIDER_SITE_OTHER): Payer: Managed Care, Other (non HMO) | Admitting: Family Medicine

## 2016-03-09 VITALS — BP 115/73 | HR 99 | Temp 98.5°F | Ht 70.6 in | Wt 187.6 lb

## 2016-03-09 DIAGNOSIS — E119 Type 2 diabetes mellitus without complications: Secondary | ICD-10-CM | POA: Diagnosis not present

## 2016-03-09 DIAGNOSIS — R413 Other amnesia: Secondary | ICD-10-CM | POA: Diagnosis not present

## 2016-03-09 DIAGNOSIS — H53461 Homonymous bilateral field defects, right side: Secondary | ICD-10-CM | POA: Diagnosis not present

## 2016-03-09 DIAGNOSIS — I6930 Unspecified sequelae of cerebral infarction: Secondary | ICD-10-CM | POA: Diagnosis not present

## 2016-03-09 LAB — BAYER DCA HB A1C WAIVED: HB A1C (BAYER DCA - WAIVED): 7 % — ABNORMAL HIGH (ref <7.0)

## 2016-03-09 MED ORDER — EMPAGLIFLOZIN 10 MG PO TABS: 10.0000 mg | ORAL_TABLET | Freq: Every day | ORAL | 1 refills | Status: DC

## 2016-03-09 MED ORDER — LISINOPRIL-HYDROCHLOROTHIAZIDE 10-12.5 MG PO TABS: 1.0000 | ORAL_TABLET | Freq: Every day | ORAL | 1 refills | Status: DC

## 2016-03-09 MED ORDER — SIMVASTATIN 40 MG PO TABS: ORAL_TABLET | ORAL | 1 refills | Status: DC

## 2016-03-09 MED ORDER — METFORMIN HCL 1000 MG PO TABS: 1000.0000 mg | ORAL_TABLET | Freq: Two times a day (BID) | ORAL | 3 refills | Status: DC

## 2016-03-09 NOTE — Assessment & Plan Note (Signed)
No change. Saw disability this morning. Call with concerns.

## 2016-03-09 NOTE — Assessment & Plan Note (Deleted)
No change. Saw disability this morning. Call with concerns.

## 2016-03-09 NOTE — Assessment & Plan Note (Signed)
A1c 7.0. Continue current regimen. Recheck 3 months.

## 2016-03-09 NOTE — Progress Notes (Signed)
BP 115/73 (BP Location: Left Arm, Patient Position: Sitting, Cuff Size: Large)   Pulse 99   Temp 98.5 F (36.9 C)   Ht 5' 10.6" (1.793 m)   Wt 187 lb 9.6 oz (85.1 kg)   SpO2 96%   BMI 26.46 kg/m    Subjective:    Patient ID: Glenn Gerold., male    DOB: 1955-11-16, 60 y.o.   MRN: [id number]  HPI: Glenn Wynder. is a 60 y.o. male  Chief Complaint  Patient presents with  . Memory Loss    6 week f/up   Has not taken any narcotics. Has not had any headaches in a while. Saw the disability doctor this morning for his memory. Didn't do very well. They aren't sure when the disability is going to go through. They will get the results in 10 days. Work let him go as it had been a year. His insurance will continue for this month, but then it will be over.   Sugars have been doing well.  DIABETES Hypoglycemic episodes:no Polydipsia/polyuria: no Visual disturbance: yes Chest pain: no Paresthesias: no Glucose Monitoring: no Taking Insulin?: no Blood Pressure Monitoring: not checking Retinal Examination: Up to Date Foot Exam: Up to Date Diabetic Education: Not Completed Pneumovax: Up to Date Influenza: Up to Date Aspirin: no  Relevant past medical, surgical, family and social history reviewed and updated as indicated. Interim medical history since our last visit reviewed. Allergies and medications reviewed and updated.  Review of Systems  Constitutional: Negative.   Eyes: Positive for visual disturbance.  Respiratory: Negative.   Cardiovascular: Negative.   Neurological:       Continues with memory issues  Psychiatric/Behavioral: Negative.     Per HPI unless specifically indicated above     Objective:    BP 115/73 (BP Location: Left Arm, Patient Position: Sitting, Cuff Size: Large)   Pulse 99   Temp 98.5 F (36.9 C)   Ht 5' 10.6" (1.793 m)   Wt 187 lb 9.6 oz (85.1 kg)   SpO2 96%   BMI 26.46 kg/m   Wt Readings from Last 3 Encounters:  03/09/16 187 lb  9.6 oz (85.1 kg)  01/27/16 187 lb (84.8 kg)  12/26/15 185 lb (83.9 kg)    Physical Exam  Constitutional: He is oriented to person, place, and time. He appears well-developed and well-nourished. No distress.  HENT:  Head: Normocephalic and atraumatic.  Right Ear: Hearing normal.  Left Ear: Hearing normal.  Nose: Nose normal.  Eyes: Conjunctivae and lids are normal. Right eye exhibits no discharge. Left eye exhibits no discharge. No scleral icterus.  Cardiovascular: Normal rate, regular rhythm, normal heart sounds and intact distal pulses.  Exam reveals no gallop and no friction rub.   No murmur heard. Pulmonary/Chest: Effort normal and breath sounds normal. No respiratory distress. He has no wheezes. He has no rales. He exhibits no tenderness.  Musculoskeletal: Normal range of motion.  Neurological: He is alert and oriented to person, place, and time.  Skin: Skin is warm, dry and intact. No rash noted. No erythema. No pallor.  Psychiatric: He has a normal mood and affect. His speech is normal and behavior is normal. Judgment and thought content normal. Cognition and memory are impaired. He exhibits abnormal recent memory and abnormal remote memory.  Nursing note and vitals reviewed.   Results for orders placed or performed in visit on 12/26/15  Bayer DCA Hb A1c Waived  Result Value Ref Range  Bayer DCA Hb A1c Waived 6.9 <7.0 %  CBC with Differential/Platelet  Result Value Ref Range   WBC 7.9 3.4 - 10.8 x10E3/uL   RBC 5.34 4.14 - 5.80 x10E6/uL   Hemoglobin 16.0 12.6 - 17.7 g/dL   Hematocrit 46.5 37.5 - 51.0 %   MCV 87 79 - 97 fL   MCH 30.0 26.6 - 33.0 pg   MCHC 34.4 31.5 - 35.7 g/dL   RDW 13.9 12.3 - 15.4 %   Platelets 313 150 - 379 x10E3/uL   Neutrophils 58 %   Lymphs 29 %   Monocytes 7 %   Eos 5 %   Basos 1 %   Neutrophils Absolute 4.6 1.4 - 7.0 x10E3/uL   Lymphocytes Absolute 2.3 0.7 - 3.1 x10E3/uL   Monocytes Absolute 0.5 0.1 - 0.9 x10E3/uL   EOS (ABSOLUTE) 0.4 0.0 -  0.4 x10E3/uL   Basophils Absolute 0.0 0.0 - 0.2 x10E3/uL   Immature Granulocytes 0 %   Immature Grans (Abs) 0.0 0.0 - 0.1 x10E3/uL  Comprehensive metabolic panel  Result Value Ref Range   Glucose 132 (H) 65 - 99 mg/dL   BUN 8 8 - 27 mg/dL   Creatinine, Ser 0.73 (L) 0.76 - 1.27 mg/dL   GFR calc non Af Amer 100 >59 mL/min/1.73   GFR calc Af Amer 116 >59 mL/min/1.73   BUN/Creatinine Ratio 11 10 - 24   Sodium 141 134 - 144 mmol/L   Potassium 5.0 3.5 - 5.2 mmol/L   Chloride 97 96 - 106 mmol/L   CO2 24 18 - 29 mmol/L   Calcium 10.5 (H) 8.6 - 10.2 mg/dL   Total Protein 7.3 6.0 - 8.5 g/dL   Albumin 4.6 3.6 - 4.8 g/dL   Globulin, Total 2.7 1.5 - 4.5 g/dL   Albumin/Globulin Ratio 1.7 1.2 - 2.2   Bilirubin Total 0.5 0.0 - 1.2 mg/dL   Alkaline Phosphatase 86 39 - 117 IU/L   AST 17 0 - 40 IU/L   ALT 15 0 - 44 IU/L  Lipid Panel Piccolo, Waived  Result Value Ref Range   Cholesterol Piccolo, Waived 151 <200 mg/dL   HDL Chol Piccolo, Waived 50 (L) >59 mg/dL   Triglycerides Piccolo,Waived 124 <150 mg/dL   Chol/HDL Ratio Piccolo,Waive 3.0 mg/dL   LDL Chol Calc Piccolo Waived 76 <100 mg/dL   VLDL Chol Calc Piccolo,Waive 25 <30 mg/dL  TSH  Result Value Ref Range   TSH 1.520 0.450 - 4.500 uIU/mL  PSA  Result Value Ref Range   Prostate Specific Ag, Serum 0.5 0.0 - 4.0 ng/mL      Assessment & Plan:   Problem List Items Addressed This Visit      Endocrine   Controlled type 2 diabetes mellitus without complication ([name]) - Primary    A1c 7.0. Continue current regimen. Recheck 3 months.       Relevant Medications   empagliflozin (JARDIANCE) 10 MG TABS tablet   lisinopril-hydrochlorothiazide (PRINZIDE,ZESTORETIC) 10-12.5 MG tablet   metFORMIN (GLUCOPHAGE) 1000 MG tablet   simvastatin (ZOCOR) 40 MG tablet   Other Relevant Orders   Bayer DCA Hb A1c Waived     Other   History of stroke with residual deficit    No change. Saw disability this morning. Call with concerns.       Right  homonymous hemianopsia    No change. Saw disability this morning. Call with concerns.       Memory loss    No change. Saw disability this morning.  Call with concerns.        Other Visit Diagnoses   None.      Follow up plan: Return in about 3 months (around 06/09/2016) for DM visit.

## 2016-03-13 ENCOUNTER — Ambulatory Visit (INDEPENDENT_AMBULATORY_CARE_PROVIDER_SITE_OTHER): Payer: Managed Care, Other (non HMO) | Admitting: Sports Medicine

## 2016-03-13 ENCOUNTER — Encounter: Payer: Self-pay | Admitting: Sports Medicine

## 2016-03-13 DIAGNOSIS — Z8673 Personal history of transient ischemic attack (TIA), and cerebral infarction without residual deficits: Secondary | ICD-10-CM

## 2016-03-13 DIAGNOSIS — R58 Hemorrhage, not elsewhere classified: Secondary | ICD-10-CM

## 2016-03-13 DIAGNOSIS — M79672 Pain in left foot: Secondary | ICD-10-CM

## 2016-03-13 DIAGNOSIS — E1142 Type 2 diabetes mellitus with diabetic polyneuropathy: Secondary | ICD-10-CM | POA: Diagnosis not present

## 2016-03-13 DIAGNOSIS — M79671 Pain in right foot: Secondary | ICD-10-CM | POA: Diagnosis not present

## 2016-03-13 DIAGNOSIS — B351 Tinea unguium: Secondary | ICD-10-CM | POA: Diagnosis not present

## 2016-03-13 DIAGNOSIS — I739 Peripheral vascular disease, unspecified: Secondary | ICD-10-CM

## 2016-03-13 NOTE — Progress Notes (Signed)
Patient ID: Glenn Gerold., male   DOB: [birth year], 60 y.o.   MRN: [id number] Subjective: [name]. is a 60 y.o. male patient with history of diabetes and stroke on Plavix who returns to office today for diabetic nail trim and to recheck right foot area of bruising secondary to shower embolus after stroke. Patient denies any new changes in medication or new problems. Patient denies any new cramping, numbness, burning or tingling in the legs.  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   Patient Active Problem List   Diagnosis Date Noted  . Controlled type 2 diabetes mellitus without complication ([place]) 99991111  . HLD (hyperlipidemia) 01/03/2016  . BP (high blood pressure) 01/03/2016  . Cerebrovascular accident (CVA) ([place]) 01/03/2016  . Memory loss 09/27/2015  . 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 Diabetic peripheral neuropathy ([place]) 05/16/2015  . History of stroke with residual deficit 04/01/2015  . Chronic headaches 03/15/2015  . Ischemic stroke ([place]) 02/17/2015  . Tobacco abuse 01/24/2015  . Hyperlipidemia 01/21/2015  . HTN (hypertension) 01/21/2015  . Sudden loss of vision 01/21/2015   Current Outpatient Prescriptions on File Prior to Visit  Medication Sig Dispense Refill  . clopidogrel (PLAVIX) 75 MG tablet TAKE 1 (ONE) TABLET PO BY MOUTH DAILY  3  . empagliflozin (JARDIANCE) 10 MG TABS tablet Take 10 mg by mouth daily. 90 tablet 1  . lisinopril-hydrochlorothiazide (PRINZIDE,ZESTORETIC) 10-12.5 MG tablet Take 1 tablet by mouth daily. 90 tablet 1  . metFORMIN (GLUCOPHAGE) 1000 MG tablet Take 1 tablet (1,000 mg total) by mouth 2 (two) times daily with a meal. 60 tablet 3  . oxyCODONE (OXYCONTIN) 10 mg 12 hr tablet Take 1 tablet (10 mg total) by mouth every 12 (twelve) hours. (Patient not taking: Reported on 03/09/2016) 60 tablet 0  . simvastatin (ZOCOR) 40 MG tablet  TAKE 1 TABLET (40 MG TOTAL) BY MOUTH AT BEDTIME. 90 tablet 1   No current facility-administered medications on file prior to visit.    Allergies  Allergen Reactions  . Lipitor [Atorvastatin] Rash  . Gabapentin Other (See Comments)    Other reaction(s): Dizziness    Recent Results (from the past 2160 hour(s))  Bayer DCA Hb A1c Waived     Status: None   Collection Time: 12/26/15  3:59 PM  Result Value Ref Range   Bayer DCA Hb A1c Waived 6.9 <7.0 %    Comment:                                       Diabetic Adult            <7.0                                       Healthy Adult        4.3 - 5.7                                                           (DCCT/NGSP) American Diabetes Association's Summary of Glycemic Recommendations for Adults  with Diabetes: Hemoglobin A1c <7.0%. More stringent glycemic goals (A1c <6.0%) may further reduce complications at the cost of increased risk of hypoglycemia.   Lipid Panel Piccolo, [place]     Status: Abnormal   Collection Time: 12/26/15  3:59 PM  Result Value Ref Range   Cholesterol Piccolo, Waived 151 <200 mg/dL    Comment:                         Desirable                <200                         Borderline High      200- 239                         High                     >239    HDL Chol Piccolo, Waived 50 (L) >59 mg/dL    Comment:                         Low HDL- Risk Factor     < 40                         High HDL- Negative       > 59                          Risk Factor (Desirable)    Triglycerides Piccolo,Waived 124 <150 mg/dL    Comment:                         Normal                   <150                         Borderline High     150 - 199                         High                200 - 499                         Very High                >499    Chol/HDL Ratio Piccolo,Waive 3.0 mg/dL    Comment:                                        Male      Male                         Low Risk      < 5.0      < 4.5                          High Risk     > 4.9      >  4.4    LDL Chol Calc Piccolo Waived 76 <100 mg/dL    Comment:                         Optimal                  <100                         Near Optimal        100 - 129                         Borderline High     130 - 159                         High                160 - 189                         Very High                >189    VLDL Chol Calc Piccolo,Waive 25 <30 mg/dL    Comment:                         Normal                   < 30                         High                     > 29   CBC with Differential/Platelet     Status: None   Collection Time: 12/26/15  4:00 PM  Result Value Ref Range   WBC 7.9 3.4 - 10.8 x10E3/uL   RBC 5.34 4.14 - 5.80 x10E6/uL   Hemoglobin 16.0 12.6 - 17.7 g/dL   Hematocrit 46.5 37.5 - 51.0 %   MCV 87 79 - 97 fL   MCH 30.0 26.6 - 33.0 pg   MCHC 34.4 31.5 - 35.7 g/dL   RDW 13.9 12.3 - 15.4 %   Platelets 313 150 - 379 x10E3/uL   Neutrophils 58 %   Lymphs 29 %   Monocytes 7 %   Eos 5 %   Basos 1 %   Neutrophils Absolute 4.6 1.4 - 7.0 x10E3/uL   Lymphocytes Absolute 2.3 0.7 - 3.1 x10E3/uL   Monocytes Absolute 0.5 0.1 - 0.9 x10E3/uL   EOS (ABSOLUTE) 0.4 0.0 - 0.4 x10E3/uL   Basophils Absolute 0.0 0.0 - 0.2 x10E3/uL   Immature Granulocytes 0 %   Immature Grans (Abs) 0.0 0.0 - 0.1 x10E3/uL  Comprehensive metabolic panel     Status: Abnormal   Collection Time: 12/26/15  4:00 PM  Result Value Ref Range   Glucose 132 (H) 65 - 99 mg/dL   BUN 8 8 - 27 mg/dL   Creatinine, Ser 0.73 (L) 0.76 - 1.27 mg/dL   GFR calc non Af Amer 100 >59 mL/min/1.73   GFR calc Af Amer 116 >59 mL/min/1.73   BUN/Creatinine Ratio 11 10 - 24   Sodium 141 134 - 144 mmol/L   Potassium 5.0 3.5 - 5.2 mmol/L   Chloride 97 96 -  106 mmol/L   CO2 24 18 - 29 mmol/L   Calcium 10.5 (H) 8.6 - 10.2 mg/dL   Total Protein 7.3 6.0 - 8.5 g/dL   Albumin 4.6 3.6 - 4.8 g/dL   Globulin, Total 2.7 1.5 - 4.5 g/dL   Albumin/Globulin Ratio 1.7 1.2 -  2.2   Bilirubin Total 0.5 0.0 - 1.2 mg/dL   Alkaline Phosphatase 86 39 - 117 IU/L   AST 17 0 - 40 IU/L   ALT 15 0 - 44 IU/L  TSH     Status: None   Collection Time: 12/26/15  4:00 PM  Result Value Ref Range   TSH 1.520 0.450 - 4.500 uIU/mL  PSA     Status: None   Collection Time: 12/26/15  4:00 PM  Result Value Ref Range   Prostate Specific Ag, Serum 0.5 0.0 - 4.0 ng/mL    Comment: Roche ECLIA methodology. According to the American Urological Association, Serum PSA should decrease and remain at undetectable levels after radical prostatectomy. The AUA defines biochemical recurrence as an initial PSA value 0.2 ng/mL or greater followed by a subsequent confirmatory PSA value 0.2 ng/mL or greater. Values obtained with different assay methods or kits cannot be used interchangeably. Results cannot be interpreted as absolute evidence of the presence or absence of malignant disease.   Bayer DCA Hb A1c Waived     Status: Abnormal   Collection Time: 03/09/16  4:00 PM  Result Value Ref Range   Bayer DCA Hb A1c Waived 7.0 (H) <7.0 %    Comment:                                       Diabetic Adult            <7.0                                       Healthy Adult        4.3 - 5.7                                                           (DCCT/NGSP) American Diabetes Association's Summary of Glycemic Recommendations for Adults with Diabetes: Hemoglobin A1c <7.0%. More stringent glycemic goals (A1c <6.0%) may further reduce complications at the cost of increased risk of hypoglycemia.     Objective: General: Patient is awake, alert, and oriented x 3 and in no acute distress.  Integument: Skin is warm, dry and supple bilateral. Nails are mildly elongated, thickened and dystrophic with subungual debris, consistent with onychomycosis, 1-5 bilateral. Focal ecchymosis right 5th lateral MTPJ. No signs of infection. Minor scab to site. No open lesions or preulcerative lesions present bilateral.  Remaining integument unremarkable.  Vasculature:  Dorsalis Pedis pulse 1/4 bilateral. Posterior Tibial pulse  1/4 bilateral.  Capillary fill time <5 sec 1-5 bilateral. Diminished hair growth to the level of the digits. Temperature gradient within normal limits. Mild varicosities present bilateral. Trace edema present bilateral.   Neurology: The patient has absent sensation measured with a 5.07/10g Semmes Weinstein Monofilament at all pedal sites bilateral . Vibratory sensation diminished bilateral with tuning  fork. No Babinski sign present bilateral.   Musculoskeletal:Minimal tenderness to Right 5th MTPJ. Muscular strength 5/5 in all lower extremity muscular groups bilateral without pain on range of motion . No tenderness with calf compression bilateral. No concern for DVT.   Assessment and Plan: Problem List Items Addressed This Visit    None    Visit Diagnoses    Ecchymosis    -  Primary   Improving   Dermatophytosis of nail       Foot pain, bilateral       Diabetic polyneuropathy associated with type 2 diabetes mellitus ([name])       PVD (peripheral vascular disease) ([place])       History of stroke         -Examined patient. -Discussed and educated patient on diabetic foot care, especially with  regards to the vascular, neurological and musculoskeletal systems.  -Stressed the importance of good glycemic control and the detriment of not  controlling glucose levels in relation to the foot. -Recommend continue with protection/offloading pad to Right 5th MTPJ  and to refrain from ill-fitting shoes that could worsening the area of ecchymosis that is improving -Mechanically debrided nails x 10 using sterile nail nipper without incident -Recommend daily skin emollients -Answered all patient questions -Patient to return in 3 months weeks for at risk foot care/nail trim and recheck ecchymosis at right 5th MTPJ -Patient advised to call the office if any problems or questions arise in the  meantime.  Landis Martins, DPM

## 2016-03-22 ENCOUNTER — Other Ambulatory Visit: Payer: Self-pay | Admitting: Family Medicine

## 2016-06-09 ENCOUNTER — Ambulatory Visit: Payer: Managed Care, Other (non HMO) | Admitting: Family Medicine

## 2016-06-19 ENCOUNTER — Ambulatory Visit: Payer: Managed Care, Other (non HMO) | Admitting: Podiatry

## 2016-07-09 ENCOUNTER — Ambulatory Visit: Payer: Managed Care, Other (non HMO) | Admitting: Family Medicine

## 2016-07-09 ENCOUNTER — Other Ambulatory Visit: Payer: Self-pay | Admitting: Family Medicine

## 2016-07-09 DIAGNOSIS — E1142 Type 2 diabetes mellitus with diabetic polyneuropathy: Secondary | ICD-10-CM

## 2016-07-09 DIAGNOSIS — E119 Type 2 diabetes mellitus without complications: Secondary | ICD-10-CM

## 2016-07-09 DIAGNOSIS — Z720 Tobacco use: Secondary | ICD-10-CM

## 2016-07-09 DIAGNOSIS — E782 Mixed hyperlipidemia: Secondary | ICD-10-CM

## 2016-07-09 DIAGNOSIS — I10 Essential (primary) hypertension: Secondary | ICD-10-CM

## 2016-07-31 ENCOUNTER — Encounter (INDEPENDENT_AMBULATORY_CARE_PROVIDER_SITE_OTHER): Payer: Managed Care, Other (non HMO)

## 2016-07-31 ENCOUNTER — Ambulatory Visit (INDEPENDENT_AMBULATORY_CARE_PROVIDER_SITE_OTHER): Payer: Managed Care, Other (non HMO) | Admitting: Vascular Surgery

## 2016-08-04 ENCOUNTER — Telehealth: Payer: Self-pay | Admitting: Family Medicine

## 2016-08-04 ENCOUNTER — Ambulatory Visit (INDEPENDENT_AMBULATORY_CARE_PROVIDER_SITE_OTHER): Payer: BLUE CROSS/BLUE SHIELD | Admitting: Family Medicine

## 2016-08-04 ENCOUNTER — Encounter: Payer: Self-pay | Admitting: Family Medicine

## 2016-08-04 VITALS — BP 120/83 | HR 85 | Temp 98.0°F | Wt 191.3 lb

## 2016-08-04 DIAGNOSIS — E1165 Type 2 diabetes mellitus with hyperglycemia: Secondary | ICD-10-CM

## 2016-08-04 LAB — HEMOGLOBIN A1C: Hemoglobin A1C: 7.4

## 2016-08-04 LAB — BAYER DCA HB A1C WAIVED: HB A1C: 7.4 % — AB (ref <7.0)

## 2016-08-04 MED ORDER — LISINOPRIL-HYDROCHLOROTHIAZIDE 10-12.5 MG PO TABS: 1.0000 | ORAL_TABLET | Freq: Every day | ORAL | 1 refills | Status: DC

## 2016-08-04 MED ORDER — CLOPIDOGREL BISULFATE 75 MG PO TABS: ORAL_TABLET | ORAL | 3 refills | Status: DC

## 2016-08-04 MED ORDER — EMPAGLIFLOZIN 10 MG PO TABS: 10.0000 mg | ORAL_TABLET | Freq: Every day | ORAL | 1 refills | Status: DC

## 2016-08-04 MED ORDER — SIMVASTATIN 40 MG PO TABS: ORAL_TABLET | ORAL | 1 refills | Status: DC

## 2016-08-04 MED ORDER — METFORMIN HCL 1000 MG PO TABS: 1000.0000 mg | ORAL_TABLET | Freq: Two times a day (BID) | ORAL | 3 refills | Status: DC

## 2016-08-04 NOTE — Assessment & Plan Note (Signed)
A1c up to 7.4- likely due to the holidays. Will work on diet and exercise and recheck in 3 months. Call with any concerns. Will get flu shot at the pharmacy.

## 2016-08-04 NOTE — Telephone Encounter (Signed)
Patient dropped off disability form for Dr Wynetta Emery.  He forgot to bring it with him to the visit today.  Santiago Glad

## 2016-08-04 NOTE — Progress Notes (Signed)
BP 120/83 (BP Location: Left Arm, Patient Position: Sitting, Cuff Size: Normal)   Pulse 85   Temp 98 F (36.7 C)   Wt 191 lb 4.8 oz (86.8 kg)   SpO2 99%   BMI 26.98 kg/m    Subjective:    Patient ID: [name]., male    DOB: Mar 27, 1956, 61 y.o.   MRN: [id number]  HPI: Amilcar Zamor. is a 61 y.o. male  Chief Complaint  Patient presents with  . Diabetes   Got disability. Won't cover his meds right now.  DIABETES Hypoglycemic episodes:no Polydipsia/polyuria: no Visual disturbance: yes Chest pain: no Paresthesias: no Glucose Monitoring: no Taking Insulin?: no Blood Pressure Monitoring: not checking Retinal Examination: Up to Date Foot Exam: Up to Date Diabetic Education: Completed Pneumovax: Up to Date Influenza: Not up to Date- will get at the pharmacy Aspirin: no  Relevant past medical, surgical, family and social history reviewed and updated as indicated. Interim medical history since our last visit reviewed. Allergies and medications reviewed and updated.  Review of Systems  Constitutional: Negative.   Respiratory: Negative.   Cardiovascular: Negative.   Psychiatric/Behavioral: Negative.     Per HPI unless specifically indicated above     Objective:    BP 120/83 (BP Location: Left Arm, Patient Position: Sitting, Cuff Size: Normal)   Pulse 85   Temp 98 F (36.7 C)   Wt 191 lb 4.8 oz (86.8 kg)   SpO2 99%   BMI 26.98 kg/m   Wt Readings from Last 3 Encounters:  08/04/16 191 lb 4.8 oz (86.8 kg)  03/09/16 187 lb 9.6 oz (85.1 kg)  01/27/16 187 lb (84.8 kg)    Physical Exam  Constitutional: He is oriented to person, place, and time. He appears well-developed and well-nourished. No distress.  HENT:  Head: Normocephalic and atraumatic.  Right Ear: Hearing normal.  Left Ear: Hearing normal.  Nose: Nose normal.  Eyes: Conjunctivae and lids are normal. Right eye exhibits no discharge. Left eye exhibits no discharge. No scleral icterus.    Cardiovascular: Normal rate, regular rhythm, normal heart sounds and intact distal pulses.  Exam reveals no gallop and no friction rub.   No murmur heard. Pulmonary/Chest: Effort normal and breath sounds normal. No respiratory distress. He has no wheezes. He has no rales. He exhibits no tenderness.  Musculoskeletal: Normal range of motion.  Neurological: He is alert and oriented to person, place, and time.  Skin: Skin is warm, dry and intact. No rash noted. He is not diaphoretic. No erythema. No pallor.  Psychiatric: He has a normal mood and affect. His speech is normal and behavior is normal. Judgment and thought content normal. Cognition and memory are normal.  Nursing note and vitals reviewed.   Results for orders placed or performed in visit on 08/04/16  Hemoglobin A1c  Result Value Ref Range   Hemoglobin A1C 7.4       Assessment & Plan:   Problem List Items Addressed This Visit      Endocrine   DM (diabetes mellitus), type 2, uncontrolled ([place]) - Primary    A1c up to 7.4- likely due to the holidays. Will work on diet and exercise and recheck in 3 months. Call with any concerns. Will get flu shot at the pharmacy.      Relevant Medications   simvastatin (ZOCOR) 40 MG tablet   metFORMIN (GLUCOPHAGE) 1000 MG tablet   lisinopril-hydrochlorothiazide (PRINZIDE,ZESTORETIC) 10-12.5 MG tablet   empagliflozin (JARDIANCE) 10 MG TABS  tablet   Other Relevant Orders   Bayer DCA Hb A1c Waived       Follow up plan: Return in about 3 months (around 11/02/2016) for DM/Chol/BP follow up.

## 2016-08-18 ENCOUNTER — Telehealth: Payer: Self-pay | Admitting: Family Medicine

## 2016-08-18 NOTE — Telephone Encounter (Signed)
Spoke with patients wife, will fill out disability paperwork and fax back to the company.

## 2016-08-18 NOTE — Telephone Encounter (Signed)
Ms [name] would like a call back regarding disability paperwork.

## 2016-08-27 ENCOUNTER — Telehealth: Payer: Self-pay | Admitting: Family Medicine

## 2016-08-28 ENCOUNTER — Encounter: Payer: Self-pay | Admitting: Family Medicine

## 2016-08-28 ENCOUNTER — Ambulatory Visit (INDEPENDENT_AMBULATORY_CARE_PROVIDER_SITE_OTHER): Payer: BLUE CROSS/BLUE SHIELD | Admitting: Family Medicine

## 2016-08-28 VITALS — BP 130/85 | HR 99 | Temp 98.4°F | Wt 188.1 lb

## 2016-08-28 DIAGNOSIS — S99922A Unspecified injury of left foot, initial encounter: Secondary | ICD-10-CM

## 2016-08-28 MED ORDER — OXYCODONE HCL ER 10 MG PO T12A: 10.0000 mg | EXTENDED_RELEASE_TABLET | Freq: Two times a day (BID) | ORAL | 0 refills | Status: DC

## 2016-08-28 MED ORDER — SULFAMETHOXAZOLE-TRIMETHOPRIM 800-160 MG PO TABS: 1.0000 | ORAL_TABLET | Freq: Two times a day (BID) | ORAL | 0 refills | Status: DC

## 2016-08-28 NOTE — Telephone Encounter (Signed)
Appointment scheduled for patient to come in today.

## 2016-08-28 NOTE — Patient Instructions (Addendum)
Fingernail or Toenail Removal, Adult, Care After  This sheet gives you information about how to care for yourself after your procedure. Your health care provider may also give you more specific instructions. If you have problems or questions, contact your health care provider.  What can I expect after the procedure?  After the procedure, it is common to have:  · Pain.  · Redness.  · Swelling.  · Soreness.     Follow these instructions at home:  · If you have a splint:  ? Do not put pressure on any part of the splint until it is fully hardened. This may take several hours.  ? Wear the splint as told by your health care provider. Remove it only as told by your health care provider.  ? Loosen the splint if your fingers or toes tingle, become numb, or turn cold and blue.  ? Keep the splint clean.  ? If the splint is not waterproof:  § Do not let it get wet.  § Cover it with a watertight covering when you take a bath or a shower.  Wound care        · Follow instructions from your health care provider about how to take care of your wound. Make sure you:  ? Wash your hands with soap and water before you change your bandage (dressing). If soap and water are not available, use hand sanitizer.  ? Change your dressing as told by your health care provider.  ? Keep your dressing dry until your health care provider says it can be removed.  ? Leave stitches (sutures), skin glue, or adhesive strips in place. These skin closures may need to stay in place for 2 weeks or longer. If adhesive strip edges start to loosen and curl up, you may trim the loose edges. Do not remove adhesive strips completely unless your health care provider tells you to do that.  · Check your wound every day for signs of infection. Check for:  ? More redness, swelling, or pain.  ? More fluid or blood.  ? Warmth.  ? Pus or a bad smell.  Managing pain, stiffness, and swelling   · Move your fingers or toes often to avoid stiffness and to lessen  swelling.  · Raise (elevate) the injured area above the level of your heart while you are sitting or lying down. You may need to keep your finger or toe raised or supported on a pillow for 24 hours or as told by your health care provider.  · Soak your hand or foot in warm, soapy water for 10-20 minutes, 3 times a day or as told by your health care provider.  Medicine   · Take over-the-counter and prescription medicines only as told by your health care provider.  · If you were prescribed an antibiotic medicine, use it as told by your health care provider. Do not stop using the antibiotic even if your condition improves.  General instructions   · If you were given a shoe to wear, wear it as told by your health care provider.  · Keep all follow-up visits as told by your health care provider. This is important.  Contact a health care provider if:  · You have more redness, swelling, or pain around your wound.  · You have more fluid or blood coming from your wound.  · Your wound feels warm to the touch.  · You have pus or a bad smell coming from your wound.  · You   Education  2017 [employer]  

## 2016-08-28 NOTE — Progress Notes (Signed)
BP 130/85 (BP Location: Left Arm, Patient Position: Sitting, Cuff Size: Large)   Pulse 99   Temp 98.4 F (36.9 C)   Wt 188 lb 1.6 oz (85.3 kg)   SpO2 97%   BMI 26.53 kg/m    Subjective:    Patient ID: Glenn Gerold., male    DOB: 11-01-1955, 61 y.o.   MRN: [id number]  HPI: Glenn Derksen. is a 61 y.o. male  Chief Complaint  Patient presents with  . Nail Problem   TOE PAIN- kicked a table and broke his toenail Duration: yesterday Involved toe: rightbig toe  Mechanism of injury: trauma Onset: sudden Severity: severe  Quality: sharp, aching and throbbing Frequency: constant Radiation: no Aggravating factors: weight bearing and walking  Alleviating factors: nothing  Status: worse Treatments attempted: nothing  Relief with NSAIDs?: No NSAIDs Taken Morning stiffness: no Redness: yes  Bruising: yes Swelling: yes Paresthesias / decreased sensation: no Fevers: no  Relevant past medical, surgical, family and social history reviewed and updated as indicated. Interim medical history since our last visit reviewed. Allergies and medications reviewed and updated.  Review of Systems  Constitutional: Negative.   Respiratory: Negative.   Cardiovascular: Negative.   Musculoskeletal: Positive for gait problem. Negative for arthralgias, back pain, joint swelling, myalgias, neck pain and neck stiffness.  Skin: Positive for color change and wound. Negative for pallor and rash.  Psychiatric/Behavioral: Negative.     Per HPI unless specifically indicated above     Objective:    BP 130/85 (BP Location: Left Arm, Patient Position: Sitting, Cuff Size: Large)   Pulse 99   Temp 98.4 F (36.9 C)   Wt 188 lb 1.6 oz (85.3 kg)   SpO2 97%   BMI 26.53 kg/m   Wt Readings from Last 3 Encounters:  08/28/16 188 lb 1.6 oz (85.3 kg)  08/04/16 191 lb 4.8 oz (86.8 kg)  03/09/16 187 lb 9.6 oz (85.1 kg)    Physical Exam  Constitutional: He is oriented to person, place, and time.  He appears well-developed and well-nourished. No distress.  HENT:  Head: Normocephalic and atraumatic.  Right Ear: Hearing normal.  Left Ear: Hearing normal.  Nose: Nose normal.  Eyes: Conjunctivae and lids are normal. Right eye exhibits no discharge. Left eye exhibits no discharge. No scleral icterus.  Pulmonary/Chest: Effort normal. No respiratory distress.  Musculoskeletal: Normal range of motion.  Neurological: He is alert and oriented to person, place, and time.  Skin: Skin is intact. No rash noted.  Ingrown R great toenail with erythema and tenderness   Psychiatric: He has a normal mood and affect. His speech is normal and behavior is normal. Judgment and thought content normal. Cognition and memory are normal.    Results for orders placed or performed in visit on 08/04/16  Bayer DCA Hb A1c Waived  Result Value Ref Range   Bayer DCA Hb A1c Waived 7.4 (H) <7.0 %  Hemoglobin A1c  Result Value Ref Range   Hemoglobin A1C 7.4       Assessment & Plan:   Problem List Items Addressed This Visit    None    Visit Diagnoses    Injury of toenail of left foot, initial encounter    -  Primary   Lateral border of his nail removed. Bactrim sent to his pharmacy. Recheck early next week. Continue to monitor closely.      Procedure: Partial Toenail removal with Matrix Diagnosis:    ICD-9-CM ICD-10-CM  1. Injury of toenail of left foot, initial encounter 959.7 S99.922A    Lateral border of his nail removed. Bactrim sent to his pharmacy. Recheck early next week. Continue to monitor closely.   Physican: Park Liter, DO Consent: Risks, benefits, and alternative treatments discussed and all questions were answered.  Patient elected to proceed and verbal consent obtained Description:  Area prepped and draped using  sterile technique. Digital block of the  Right  1st toe performed by injecting local anesthetic at the base of the toe at the 2 oclock and 10 oclock positions, using 3.0 cc's of   1% lidocaine plain. After confirming adequate anesthesia, lateral nail folds and epinychia were freed up using periosteal elevator.  Using scissors the nail was vertically cut beyond the epinychia to the base.  A hemostat was then used to remove the nail fragment. The nail was grasped using a hemostat and the nail was removed intact. Phenol was applied to the nail matrix x 3 using a cotton applicator tip.   Bacitracin ointment was applied to the operative site a circumferential gauze dressive was applied.  The patient tolerated the procedure well.  Complications: none Estimated Blood Loss: minimal Post Procedure Instructions: The patient was encouraged to keep the dressing in place for 24 hours and keep the foot elevated as much as possible during this time.  After the first day they are instructed to soak the toe in warm water 3 times daily for 3-4 days.  Antibiotic ointment is to be applied daily for 1 week.  The patient was informed that some oozing is to be expected for 1-2 weeks but that they should return immediately for pus, increased pain or redness.  They were instructed to take APAP or motrin as needed for post operative discomfort.   Follow up plan: Return Monday or Tuesday, for Recheck toe.

## 2016-08-31 ENCOUNTER — Ambulatory Visit (INDEPENDENT_AMBULATORY_CARE_PROVIDER_SITE_OTHER): Payer: BLUE CROSS/BLUE SHIELD | Admitting: Family Medicine

## 2016-08-31 ENCOUNTER — Encounter: Payer: Self-pay | Admitting: Family Medicine

## 2016-08-31 VITALS — BP 144/75 | HR 91 | Temp 98.2°F | Wt 189.0 lb

## 2016-08-31 DIAGNOSIS — S99922D Unspecified injury of left foot, subsequent encounter: Secondary | ICD-10-CM

## 2016-08-31 NOTE — Progress Notes (Signed)
Toe healing well. No redness, significant bruising. No heat. Healing well. Recheck Friday.

## 2016-09-04 ENCOUNTER — Encounter: Payer: Self-pay | Admitting: Family Medicine

## 2016-09-04 ENCOUNTER — Ambulatory Visit (INDEPENDENT_AMBULATORY_CARE_PROVIDER_SITE_OTHER): Payer: BLUE CROSS/BLUE SHIELD | Admitting: Family Medicine

## 2016-09-04 VITALS — BP 119/73 | HR 86 | Temp 98.2°F | Wt 187.2 lb

## 2016-09-04 DIAGNOSIS — S99922D Unspecified injury of left foot, subsequent encounter: Secondary | ICD-10-CM

## 2016-09-04 NOTE — Progress Notes (Signed)
Healing well. No heat, no redness, continues with bruising. May lose toenail. Recheck 1 week.

## 2016-09-11 ENCOUNTER — Telehealth: Payer: Self-pay | Admitting: Family Medicine

## 2016-09-11 ENCOUNTER — Ambulatory Visit (INDEPENDENT_AMBULATORY_CARE_PROVIDER_SITE_OTHER): Payer: BLUE CROSS/BLUE SHIELD | Admitting: Family Medicine

## 2016-09-11 ENCOUNTER — Encounter: Payer: Self-pay | Admitting: Family Medicine

## 2016-09-11 VITALS — BP 120/78 | HR 94 | Temp 98.3°F | Wt 188.4 lb

## 2016-09-11 DIAGNOSIS — S99922D Unspecified injury of left foot, subsequent encounter: Secondary | ICD-10-CM

## 2016-09-11 NOTE — Telephone Encounter (Signed)
Patient dropped of form for Dr Wynetta Emery to fill out and fax.  Form was put in box for Dr Lenna Sciara.  Thanks

## 2016-09-11 NOTE — Progress Notes (Signed)
Doing really well. Healed well. Almost 100% healed. Bruising improved. Continue to wrap for another week. Call with any concerns.

## 2016-09-14 NOTE — Telephone Encounter (Signed)
Form faxed back.

## 2016-09-14 NOTE — Telephone Encounter (Signed)
Form filled out and ready to be faxed back. 

## 2016-09-14 NOTE — Telephone Encounter (Signed)
Forms on your desk.

## 2016-11-02 ENCOUNTER — Ambulatory Visit (INDEPENDENT_AMBULATORY_CARE_PROVIDER_SITE_OTHER): Payer: BLUE CROSS/BLUE SHIELD | Admitting: Family Medicine

## 2016-11-02 ENCOUNTER — Other Ambulatory Visit: Payer: Self-pay | Admitting: Family Medicine

## 2016-11-02 ENCOUNTER — Encounter: Payer: Self-pay | Admitting: Family Medicine

## 2016-11-02 VITALS — BP 123/87 | HR 99 | Temp 98.4°F | Resp 17 | Ht 70.6 in | Wt 187.0 lb

## 2016-11-02 DIAGNOSIS — E1165 Type 2 diabetes mellitus with hyperglycemia: Secondary | ICD-10-CM | POA: Diagnosis not present

## 2016-11-02 DIAGNOSIS — E1142 Type 2 diabetes mellitus with diabetic polyneuropathy: Secondary | ICD-10-CM | POA: Diagnosis not present

## 2016-11-02 DIAGNOSIS — Z720 Tobacco use: Secondary | ICD-10-CM

## 2016-11-02 DIAGNOSIS — E119 Type 2 diabetes mellitus without complications: Secondary | ICD-10-CM

## 2016-11-02 DIAGNOSIS — E782 Mixed hyperlipidemia: Secondary | ICD-10-CM | POA: Diagnosis not present

## 2016-11-02 DIAGNOSIS — I10 Essential (primary) hypertension: Secondary | ICD-10-CM

## 2016-11-02 LAB — HM DIABETES FOOT EXAM: HM Diabetic Foot Exam: NORMAL

## 2016-11-02 NOTE — Assessment & Plan Note (Addendum)
Stable. A1c back at 7.0 today. Continue current regimen. Refills given today. Call with any concerns.

## 2016-11-02 NOTE — Assessment & Plan Note (Signed)
Slightly elevated today. Call with any concerns. Continue current regimen. Work on diet.

## 2016-11-02 NOTE — Assessment & Plan Note (Signed)
Stable on current regimen. Continue current regimen. Continue to monitor. Call with any concerns.  

## 2016-11-02 NOTE — Progress Notes (Signed)
BP 123/87 (BP Location: Left Arm, Patient Position: Sitting, Cuff Size: Normal)   Pulse 99   Temp 98.4 F (36.9 C) (Oral)   Resp 17   Ht 5' 10.6" (1.793 m)   Wt 187 lb (84.8 kg)   SpO2 96%   BMI 26.38 kg/m    Subjective:    Patient ID: Glenn Gerold., male    DOB: 03/24/1956, 61 y.o.   MRN: [id number]  HPI: Glenn Peace. is a 61 y.o. male  Chief Complaint  Patient presents with  . Hypertension  . Hyperlipidemia  . Diabetes   HYPERTENSION / HYPERLIPIDEMIA Satisfied with current treatment? yes Duration of hypertension: chronic BP monitoring frequency: not checking BP range:  BP medication side effects: no Past BP meds: lisinopril- hctz Duration of hyperlipidemia: chronic Cholesterol medication side effects: no Cholesterol supplements: none Past cholesterol medications: simvastatin (zocor) Medication compliance: excellent compliance Aspirin: no Recent stressors: no Recurrent headaches: yes Visual changes: yes Palpitations: no Dyspnea: no Chest pain: no Lower extremity edema: no Dizzy/lightheaded: no  DIABETES Hypoglycemic episodes:no Polydipsia/polyuria: no Visual disturbance: yes Chest pain: no Paresthesias: no Glucose Monitoring: no Taking Insulin?: no Blood Pressure Monitoring: not checking Retinal Examination: Not up to Date Foot Exam: Up to Date Diabetic Education: Completed Pneumovax: Up to Date Influenza: Up to Date Aspirin: no  Relevant past medical, surgical, family and social history reviewed and updated as indicated. Interim medical history since our last visit reviewed. Allergies and medications reviewed and updated.  Review of Systems  Constitutional: Negative.   Respiratory: Negative.   Cardiovascular: Negative.   Psychiatric/Behavioral: Negative.     Per HPI unless specifically indicated above     Objective:    BP 123/87 (BP Location: Left Arm, Patient Position: Sitting, Cuff Size: Normal)   Pulse 99   Temp 98.4  F (36.9 C) (Oral)   Resp 17   Ht 5' 10.6" (1.793 m)   Wt 187 lb (84.8 kg)   SpO2 96%   BMI 26.38 kg/m   Wt Readings from Last 3 Encounters:  11/02/16 187 lb (84.8 kg)  09/11/16 188 lb 6.4 oz (85.5 kg)  09/04/16 187 lb 3.2 oz (84.9 kg)    Physical Exam  Constitutional: He is oriented to person, place, and time. He appears well-developed and well-nourished. No distress.  HENT:  Head: Normocephalic and atraumatic.  Right Ear: Hearing normal.  Left Ear: Hearing normal.  Nose: Nose normal.  Eyes: Conjunctivae and lids are normal. Right eye exhibits no discharge. Left eye exhibits no discharge. No scleral icterus.  Cardiovascular: Normal rate, regular rhythm, normal heart sounds and intact distal pulses.  Exam reveals no gallop and no friction rub.   No murmur heard. Pulmonary/Chest: Effort normal and breath sounds normal. No respiratory distress. He has no wheezes. He has no rales. He exhibits no tenderness.  Musculoskeletal: Normal range of motion.  Neurological: He is alert and oriented to person, place, and time.  Skin: Skin is warm and intact. No rash noted. No erythema. No pallor.  Psychiatric: He has a normal mood and affect. His speech is normal and behavior is normal. Judgment and thought content normal. Cognition and memory are normal.  Nursing note and vitals reviewed.  Diabetic Foot Exam - Simple   Simple Foot Form Visual Inspection No deformities, no ulcerations, no other skin breakdown bilaterally:  Yes Sensation Testing Intact to touch and monofilament testing bilaterally:  Yes Pulse Check Posterior Tibialis and Dorsalis pulse intact bilaterally:  Yes  Comments     Results for orders placed or performed in visit on 11/02/16  HM DIABETES FOOT EXAM  Result Value Ref Range   HM Diabetic Foot Exam normal       Assessment & Plan:   Problem List Items Addressed This Visit      Cardiovascular and Mediastinum   HTN (hypertension) - Primary    Stable on current  regimen. Continue current regimen. Continue to monitor. Call with any concerns.         Endocrine   DM (diabetes mellitus), type 2, uncontrolled ([place])    Stable. A1c back at 7.0 today. Continue current regimen. Refills given today. Call with any concerns.       Diabetic peripheral neuropathy ([name])     Other   Hyperlipidemia    Slightly elevated today. Call with any concerns. Continue current regimen. Work on diet.       Tobacco abuse    Still struggling. Continue to cut down. Call with any concerns.        Other Visit Diagnoses    Controlled type 2 diabetes mellitus without complication, without long-term current use of insulin ([place])       Relevant Orders   Bayer DCA Hb A1c Waived       Follow up plan: Return in about 3 months (around 02/01/2017) for DM follow up with A1c.

## 2016-11-02 NOTE — Assessment & Plan Note (Signed)
Still struggling. Continue to cut down. Call with any concerns.

## 2016-11-03 LAB — CBC WITH DIFFERENTIAL/PLATELET
Basophils Absolute: 0.1 10*3/uL (ref 0.0–0.2)
Basos: 1 %
EOS (ABSOLUTE): 0.5 10*3/uL — ABNORMAL HIGH (ref 0.0–0.4)
EOS: 6 %
HEMATOCRIT: 47.7 % (ref 37.5–51.0)
Hemoglobin: 16.7 g/dL (ref 13.0–17.7)
Immature Grans (Abs): 0 10*3/uL (ref 0.0–0.1)
Immature Granulocytes: 0 %
Lymphocytes Absolute: 2.2 10*3/uL (ref 0.7–3.1)
Lymphs: 24 %
MCH: 30.7 pg (ref 26.6–33.0)
MCHC: 35 g/dL (ref 31.5–35.7)
MCV: 88 fL (ref 79–97)
MONOCYTES: 7 %
MONOS ABS: 0.6 10*3/uL (ref 0.1–0.9)
NEUTROS PCT: 62 %
Neutrophils Absolute: 5.7 10*3/uL (ref 1.4–7.0)
Platelets: 282 10*3/uL (ref 150–379)
RBC: 5.44 x10E6/uL (ref 4.14–5.80)
RDW: 14.6 % (ref 12.3–15.4)
WBC: 9 10*3/uL (ref 3.4–10.8)

## 2016-11-03 LAB — COMPREHENSIVE METABOLIC PANEL
A/G RATIO: 2 (ref 1.2–2.2)
ALT: 28 IU/L (ref 0–44)
AST: 25 IU/L (ref 0–40)
Albumin: 4.9 g/dL — ABNORMAL HIGH (ref 3.6–4.8)
Alkaline Phosphatase: 89 IU/L (ref 39–117)
BILIRUBIN TOTAL: 0.4 mg/dL (ref 0.0–1.2)
BUN/Creatinine Ratio: 14 (ref 10–24)
BUN: 11 mg/dL (ref 8–27)
CHLORIDE: 93 mmol/L — AB (ref 96–106)
CO2: 23 mmol/L (ref 18–29)
Calcium: 10.5 mg/dL — ABNORMAL HIGH (ref 8.6–10.2)
Creatinine, Ser: 0.81 mg/dL (ref 0.76–1.27)
GFR, EST AFRICAN AMERICAN: 110 mL/min/{1.73_m2} (ref >59)
GFR, EST NON AFRICAN AMERICAN: 95 mL/min/{1.73_m2} (ref >59)
GLOBULIN, TOTAL: 2.5 g/dL (ref 1.5–4.5)
Glucose: 131 mg/dL — ABNORMAL HIGH (ref 65–99)
POTASSIUM: 4.5 mmol/L (ref 3.5–5.2)
SODIUM: 138 mmol/L (ref 134–144)
TOTAL PROTEIN: 7.4 g/dL (ref 6.0–8.5)

## 2016-11-03 LAB — MICROSCOPIC EXAMINATION
Bacteria, UA: NONE SEEN
RBC, UA: NONE SEEN /hpf (ref 0–?)

## 2016-11-03 LAB — UA/M W/RFLX CULTURE, ROUTINE
Bilirubin, UA: NEGATIVE
KETONES UA: NEGATIVE
LEUKOCYTES UA: NEGATIVE
Nitrite, UA: NEGATIVE
PROTEIN UA: NEGATIVE
RBC UA: NEGATIVE
SPEC GRAV UA: 1.01 (ref 1.005–1.030)
Urobilinogen, Ur: 0.2 mg/dL (ref 0.2–1.0)
pH, UA: 5.5 (ref 5.0–7.5)

## 2016-11-03 LAB — MICROALBUMIN, URINE WAIVED
Creatinine, Urine Waived: 50 mg/dL (ref 10–300)
MICROALB, UR WAIVED: 10 mg/L (ref 0–19)

## 2016-11-03 LAB — LIPID PANEL PICCOLO, WAIVED
Chol/HDL Ratio Piccolo,Waive: 3.3 mg/dL
Cholesterol Piccolo, Waived: 209 mg/dL — ABNORMAL HIGH (ref <200)
HDL Chol Piccolo, Waived: 63 mg/dL (ref >59)
LDL Chol Calc Piccolo Waived: 94 mg/dL (ref <100)
Triglycerides Piccolo,Waived: 261 mg/dL — ABNORMAL HIGH (ref <150)
VLDL CHOL CALC PICCOLO,WAIVE: 52 mg/dL — AB (ref <30)

## 2016-11-03 LAB — TSH: TSH: 1.44 u[IU]/mL (ref 0.450–4.500)

## 2016-11-03 LAB — BAYER DCA HB A1C WAIVED: HB A1C (BAYER DCA - WAIVED): 7 % — ABNORMAL HIGH (ref <7.0)

## 2017-02-09 ENCOUNTER — Other Ambulatory Visit: Payer: Self-pay | Admitting: Family Medicine

## 2017-02-11 NOTE — Progress Notes (Signed)
BP 123/80 (BP Location: Left Arm, Patient Position: Sitting, Cuff Size: Normal)   Pulse (!) 103   Temp 98 F (36.7 C)   Wt 189 lb 1 oz (85.8 kg)   BMI 26.67 kg/m    Subjective:    Patient ID: [name], male    DOB: 01/08/1956, 61 y.o.   MRN: [id number]  HPI: Deyonte Cadden. is a 61 y.o. male  Chief Complaint  Patient presents with  . Diabetes   DIABETES Hypoglycemic episodes:no Polydipsia/polyuria: no Visual disturbance: no Chest pain: no Paresthesias: yes Glucose Monitoring: no Taking Insulin?: no Blood Pressure Monitoring: not checking Retinal Examination: Not up to Date Foot Exam: Up to Date Diabetic Education: Completed Pneumovax: Up to Date Influenza: Up to Date Aspirin: yes   Relevant past medical, surgical, family and social history reviewed and updated as indicated. Interim medical history since our last visit reviewed. Allergies and medications reviewed and updated.  Review of Systems  Constitutional: Negative.   Respiratory: Negative.   Cardiovascular: Negative.   Psychiatric/Behavioral: Negative.     Per HPI unless specifically indicated above     Objective:    BP 123/80 (BP Location: Left Arm, Patient Position: Sitting, Cuff Size: Normal)   Pulse (!) 103   Temp 98 F (36.7 C)   Wt 189 lb 1 oz (85.8 kg)   BMI 26.67 kg/m   Wt Readings from Last 3 Encounters:  02/15/17 189 lb 1 oz (85.8 kg)  11/02/16 187 lb (84.8 kg)  09/11/16 188 lb 6.4 oz (85.5 kg)    Physical Exam  Constitutional: He is oriented to person, place, and time. He appears well-developed and well-nourished. No distress.  HENT:  Head: Normocephalic and atraumatic.  Right Ear: Hearing normal.  Left Ear: Hearing normal.  Nose: Nose normal.  Eyes: Conjunctivae and lids are normal. Right eye exhibits no discharge. Left eye exhibits no discharge. No scleral icterus.  Cardiovascular: Normal rate, regular rhythm, normal heart sounds and intact distal pulses.  Exam  reveals no gallop and no friction rub.   No murmur heard. Pulmonary/Chest: Effort normal and breath sounds normal. No respiratory distress. He has no wheezes. He has no rales. He exhibits no tenderness.  Musculoskeletal: Normal range of motion.  Neurological: He is alert and oriented to person, place, and time.  Skin: Skin is warm, dry and intact. No rash noted. He is not diaphoretic. No erythema. No pallor.  Psychiatric: He has a normal mood and affect. His speech is normal and behavior is normal. Judgment and thought content normal. Cognition and memory are normal.  Nursing note and vitals reviewed.   Results for orders placed or performed in visit on 11/02/16  CBC with Differential/Platelet  Result Value Ref Range   WBC 9.0 3.4 - 10.8 x10E3/uL   RBC 5.44 4.14 - 5.80 x10E6/uL   Hemoglobin 16.7 13.0 - 17.7 g/dL   Hematocrit 47.7 37.5 - 51.0 %   MCV 88 79 - 97 fL   MCH 30.7 26.6 - 33.0 pg   MCHC 35.0 31.5 - 35.7 g/dL   RDW 14.6 12.3 - 15.4 %   Platelets 282 150 - 379 x10E3/uL   Neutrophils 62 Not Estab. %   Lymphs 24 Not Estab. %   Monocytes 7 Not Estab. %   Eos 6 Not Estab. %   Basos 1 Not Estab. %   Neutrophils Absolute 5.7 1.4 - 7.0 x10E3/uL   Lymphocytes Absolute 2.2 0.7 - 3.1 x10E3/uL  Monocytes Absolute 0.6 0.1 - 0.9 x10E3/uL   EOS (ABSOLUTE) 0.5 (H) 0.0 - 0.4 x10E3/uL   Basophils Absolute 0.1 0.0 - 0.2 x10E3/uL   Immature Granulocytes 0 Not Estab. %   Immature Grans (Abs) 0.0 0.0 - 0.1 x10E3/uL  Comprehensive metabolic panel  Result Value Ref Range   Glucose 131 (H) 65 - 99 mg/dL   BUN 11 8 - 27 mg/dL   Creatinine, Ser 0.81 0.76 - 1.27 mg/dL   GFR calc non Af Amer 95 >59 mL/min/1.73   GFR calc Af Amer 110 >59 mL/min/1.73   BUN/Creatinine Ratio 14 10 - 24   Sodium 138 134 - 144 mmol/L   Potassium 4.5 3.5 - 5.2 mmol/L   Chloride 93 (L) 96 - 106 mmol/L   CO2 23 18 - 29 mmol/L   Calcium 10.5 (H) 8.6 - 10.2 mg/dL   Total Protein 7.4 6.0 - 8.5 g/dL   Albumin 4.9 (H)  3.6 - 4.8 g/dL   Globulin, Total 2.5 1.5 - 4.5 g/dL   Albumin/Globulin Ratio 2.0 1.2 - 2.2   Bilirubin Total 0.4 0.0 - 1.2 mg/dL   Alkaline Phosphatase 89 39 - 117 IU/L   AST 25 0 - 40 IU/L   ALT 28 0 - 44 IU/L  TSH  Result Value Ref Range   TSH 1.440 0.450 - 4.500 uIU/mL      Assessment & Plan:   Problem List Items Addressed This Visit      Endocrine   DM (diabetes mellitus), type 2, uncontrolled ([place]) - Primary    A1c 7.1. Not watching his diet. Otherwise doing well. Continue current regimen. Call with any concerns. Recheck 3 months.       Relevant Medications   simvastatin (ZOCOR) 40 MG tablet   metFORMIN (GLUCOPHAGE) 1000 MG tablet   empagliflozin (JARDIANCE) 10 MG TABS tablet   Other Relevant Orders   Bayer DCA Hb A1c Waived     Nervous and Auditory   Alcohol-induced polyneuropathy ([name])    Stable. Continue current regimen. Continue to monitor.         Other   Chronic headaches    Well controlled on the oxycodone. Needs a refill. Rx given today. Take sparingly.      Relevant Medications   oxyCODONE (OXYCONTIN) 10 mg 12 hr tablet       Follow up plan: Return in about 3 months (around 05/18/2017) for Physical.

## 2017-02-15 ENCOUNTER — Ambulatory Visit (INDEPENDENT_AMBULATORY_CARE_PROVIDER_SITE_OTHER): Payer: BLUE CROSS/BLUE SHIELD | Admitting: Family Medicine

## 2017-02-15 ENCOUNTER — Encounter: Payer: Self-pay | Admitting: Family Medicine

## 2017-02-15 VITALS — BP 123/80 | HR 103 | Temp 98.0°F | Wt 189.1 lb

## 2017-02-15 DIAGNOSIS — G621 Alcoholic polyneuropathy: Secondary | ICD-10-CM

## 2017-02-15 DIAGNOSIS — E1165 Type 2 diabetes mellitus with hyperglycemia: Secondary | ICD-10-CM | POA: Diagnosis not present

## 2017-02-15 DIAGNOSIS — G44321 Chronic post-traumatic headache, intractable: Secondary | ICD-10-CM | POA: Diagnosis not present

## 2017-02-15 MED ORDER — EMPAGLIFLOZIN 10 MG PO TABS: 10.0000 mg | ORAL_TABLET | Freq: Every day | ORAL | 1 refills | Status: DC

## 2017-02-15 MED ORDER — SIMVASTATIN 40 MG PO TABS: ORAL_TABLET | ORAL | 1 refills | Status: DC

## 2017-02-15 MED ORDER — METFORMIN HCL 1000 MG PO TABS: 1000.0000 mg | ORAL_TABLET | Freq: Two times a day (BID) | ORAL | 3 refills | Status: DC

## 2017-02-15 MED ORDER — OXYCODONE HCL ER 10 MG PO T12A: 10.0000 mg | EXTENDED_RELEASE_TABLET | Freq: Two times a day (BID) | ORAL | 0 refills | Status: DC

## 2017-02-15 NOTE — Assessment & Plan Note (Signed)
Well controlled on the oxycodone. Needs a refill. Rx given today. Take sparingly.

## 2017-02-15 NOTE — Assessment & Plan Note (Signed)
Stable. Continue current regimen. Continue to monitor.  

## 2017-02-15 NOTE — Assessment & Plan Note (Signed)
A1c 7.1. Not watching his diet. Otherwise doing well. Continue current regimen. Call with any concerns. Recheck 3 months.

## 2017-02-16 ENCOUNTER — Telehealth: Payer: Self-pay | Admitting: Family Medicine

## 2017-02-16 NOTE — Telephone Encounter (Signed)
Called [facility] Is needed on pain medication. Pharmacy stated pt got upset when he didn't understand and took the script back. Will initiate via [email or web address]

## 2017-02-16 NOTE — Telephone Encounter (Signed)
Patient states he received a call from [place] needing Dr Wynetta Emery to give them a call back regarding a question on the script for his hydrocodone.  If any questions call [name] at [phone]  Thank you

## 2017-02-16 NOTE — Telephone Encounter (Signed)
[name] Key: [id number]  Approved Effective from 02/16/2017 through 03/17/2017. Drug OxyCODONE HCl 10MG  OR TABS

## 2017-02-16 NOTE — Telephone Encounter (Signed)
Pt aware.

## 2017-02-25 LAB — BAYER DCA HB A1C WAIVED: HB A1C: 7.1 % — AB (ref <7.0)

## 2017-05-20 ENCOUNTER — Ambulatory Visit (INDEPENDENT_AMBULATORY_CARE_PROVIDER_SITE_OTHER): Payer: BLUE CROSS/BLUE SHIELD | Admitting: Family Medicine

## 2017-05-20 ENCOUNTER — Encounter: Payer: Self-pay | Admitting: Family Medicine

## 2017-05-20 VITALS — BP 129/80 | HR 86 | Ht 71.0 in | Wt 188.0 lb

## 2017-05-20 DIAGNOSIS — G621 Alcoholic polyneuropathy: Secondary | ICD-10-CM

## 2017-05-20 DIAGNOSIS — I6930 Unspecified sequelae of cerebral infarction: Secondary | ICD-10-CM

## 2017-05-20 DIAGNOSIS — Z0000 Encounter for general adult medical examination without abnormal findings: Secondary | ICD-10-CM | POA: Diagnosis not present

## 2017-05-20 DIAGNOSIS — E782 Mixed hyperlipidemia: Secondary | ICD-10-CM | POA: Diagnosis not present

## 2017-05-20 DIAGNOSIS — E1142 Type 2 diabetes mellitus with diabetic polyneuropathy: Secondary | ICD-10-CM | POA: Diagnosis not present

## 2017-05-20 DIAGNOSIS — I10 Essential (primary) hypertension: Secondary | ICD-10-CM | POA: Diagnosis not present

## 2017-05-20 DIAGNOSIS — Z125 Encounter for screening for malignant neoplasm of prostate: Secondary | ICD-10-CM

## 2017-05-20 DIAGNOSIS — Z720 Tobacco use: Secondary | ICD-10-CM

## 2017-05-20 DIAGNOSIS — R413 Other amnesia: Secondary | ICD-10-CM | POA: Diagnosis not present

## 2017-05-20 DIAGNOSIS — G44321 Chronic post-traumatic headache, intractable: Secondary | ICD-10-CM

## 2017-05-20 DIAGNOSIS — H53461 Homonymous bilateral field defects, right side: Secondary | ICD-10-CM

## 2017-05-20 LAB — UA/M W/RFLX CULTURE, ROUTINE
BILIRUBIN UA: NEGATIVE
KETONES UA: NEGATIVE
Leukocytes, UA: NEGATIVE
Nitrite, UA: NEGATIVE
PH UA: 6 (ref 5.0–7.5)
PROTEIN UA: NEGATIVE
RBC, UA: NEGATIVE
SPEC GRAV UA: 1.01 (ref 1.005–1.030)
UUROB: 0.2 mg/dL (ref 0.2–1.0)

## 2017-05-20 LAB — MICROSCOPIC EXAMINATION: BACTERIA UA: NONE SEEN

## 2017-05-20 LAB — MICROALBUMIN, URINE WAIVED
Creatinine, Urine Waived: 50 mg/dL (ref 10–300)
Microalb, Ur Waived: 10 mg/L (ref 0–19)

## 2017-05-20 LAB — BAYER DCA HB A1C WAIVED: HB A1C: 7.2 % — AB (ref <7.0)

## 2017-05-20 MED ORDER — CICLOPIROX 8 % EX SOLN: CUTANEOUS | 0 refills | Status: DC

## 2017-05-20 MED ORDER — EMPAGLIFLOZIN 10 MG PO TABS: 10.0000 mg | ORAL_TABLET | Freq: Every day | ORAL | 3 refills | Status: DC

## 2017-05-20 MED ORDER — METFORMIN HCL 1000 MG PO TABS: 1000.0000 mg | ORAL_TABLET | Freq: Two times a day (BID) | ORAL | 3 refills | Status: DC

## 2017-05-20 MED ORDER — SIMVASTATIN 40 MG PO TABS: ORAL_TABLET | ORAL | 3 refills | Status: DC

## 2017-05-20 MED ORDER — CLOPIDOGREL BISULFATE 75 MG PO TABS: ORAL_TABLET | ORAL | 3 refills | Status: DC

## 2017-05-20 MED ORDER — OXYCODONE HCL ER 10 MG PO T12A: 10.0000 mg | EXTENDED_RELEASE_TABLET | Freq: Two times a day (BID) | ORAL | 0 refills | Status: DC

## 2017-05-20 MED ORDER — LISINOPRIL-HYDROCHLOROTHIAZIDE 10-12.5 MG PO TABS: 1.0000 | ORAL_TABLET | Freq: Every day | ORAL | 3 refills | Status: DC

## 2017-05-20 NOTE — Assessment & Plan Note (Signed)
Not interested in quitting. He knows we're here if he wants to quit or needs help.

## 2017-05-20 NOTE — Assessment & Plan Note (Signed)
No change. Not able to work. Continue to monitor.

## 2017-05-20 NOTE — Patient Instructions (Addendum)
Consider occupational therapy to help you figure out how to get things done   Health Maintenance, Male A healthy lifestyle and preventive care is important for your health and wellness. Ask your health care provider about what schedule of regular examinations is right for you. What should I know about weight and diet? Eat a Healthy Diet  Eat plenty of vegetables, fruits, whole grains, low-fat dairy products, and lean protein.  Do not eat a lot of foods high in solid fats, added sugars, or salt.  Maintain a Healthy Weight Regular exercise can help you achieve or maintain a healthy weight. You should:  Do at least 150 minutes of exercise each week. The exercise should increase your heart rate and make you sweat (moderate-intensity exercise).  Do strength-training exercises at least twice a week.  Watch Your Levels of Cholesterol and Blood Lipids  Have your blood tested for lipids and cholesterol every 5 years starting at 62 years of age. If you are at high risk for heart disease, you should start having your blood tested when you are 62 years old. You may need to have your cholesterol levels checked more often if: ? Your lipid or cholesterol levels are high. ? You are older than 62 years of age. ? You are at high risk for heart disease.  What should I know about cancer screening? Many types of cancers can be detected early and may often be prevented. Lung Cancer  You should be screened every year for lung cancer if: ? You are a current smoker who has smoked for at least 30 years. ? You are a former smoker who has quit within the past 15 years.  Talk to your health care provider about your screening options, when you should start screening, and how often you should be screened.  Colorectal Cancer  Routine colorectal cancer screening usually begins at 62 years of age and should be repeated every 5-10 years until you are 62 years old. You may need to be screened more often if early  forms of precancerous polyps or small growths are found. Your health care provider may recommend screening at an earlier age if you have risk factors for colon cancer.  Your health care provider may recommend using home test kits to check for hidden blood in the stool.  A small camera at the end of a tube can be used to examine your colon (sigmoidoscopy or colonoscopy). This checks for the earliest forms of colorectal cancer.  Prostate and Testicular Cancer  Depending on your age and overall health, your health care provider may do certain tests to screen for prostate and testicular cancer.  Talk to your health care provider about any symptoms or concerns you have about testicular or prostate cancer.  Skin Cancer  Check your skin from head to toe regularly.  Tell your health care provider about any new moles or changes in moles, especially if: ? There is a change in a mole's size, shape, or color. ? You have a mole that is larger than a pencil eraser.  Always use sunscreen. Apply sunscreen liberally and repeat throughout the day.  Protect yourself by wearing long sleeves, pants, a wide-brimmed hat, and sunglasses when outside.  What should I know about heart disease, diabetes, and high blood pressure?  If you are 75-41 years of age, have your blood pressure checked every 3-5 years. If you are 57 years of age or older, have your blood pressure checked every year. You should have your  blood pressure measured twice-once when you are at a hospital or clinic, and once when you are not at a hospital or clinic. Record the average of the two measurements. To check your blood pressure when you are not at a hospital or clinic, you can use: ? An automated blood pressure machine at a pharmacy. ? A home blood pressure monitor.  Talk to your health care provider about your target blood pressure.  If you are between 69-40 years old, ask your health care provider if you should take aspirin to prevent  heart disease.  Have regular diabetes screenings by checking your fasting blood sugar level. ? If you are at a normal weight and have a low risk for diabetes, have this test once every three years after the age of 75. ? If you are overweight and have a high risk for diabetes, consider being tested at a younger age or more often.  A one-time screening for abdominal aortic aneurysm (AAA) by ultrasound is recommended for men aged 75-75 years who are current or former smokers. What should I know about preventing infection? Hepatitis B If you have a higher risk for hepatitis B, you should be screened for this virus. Talk with your health care provider to find out if you are at risk for hepatitis B infection. Hepatitis C Blood testing is recommended for:  Everyone born from [birth year] through 1966.  Anyone with known risk factors for hepatitis C.  Sexually Transmitted Diseases (STDs)  You should be screened each year for STDs including gonorrhea and chlamydia if: ? You are sexually active and are younger than 62 years of age. ? You are older than 62 years of age and your health care provider tells you that you are at risk for this type of infection. ? Your sexual activity has changed since you were last screened and you are at an increased risk for chlamydia or gonorrhea. Ask your health care provider if you are at risk.  Talk with your health care provider about whether you are at high risk of being infected with HIV. Your health care provider may recommend a prescription medicine to help prevent HIV infection.  What else can I do?  Schedule regular health, dental, and eye exams.  Stay current with your vaccines (immunizations).  Do not use any tobacco products, such as cigarettes, chewing tobacco, and e-cigarettes. If you need help quitting, ask your health care provider.  Limit alcohol intake to no more than 2 drinks per day. One drink equals 12 ounces of beer, 5 ounces of wine, or 1 ounces  of hard liquor.  Do not use street drugs.  Do not share needles.  Ask your health care provider for help if you need support or information about quitting drugs.  Tell your health care provider if you often feel depressed.  Tell your health care provider if you have ever been abused or do not feel safe at home. This information is not intended to replace advice given to you by your health care provider. Make sure you discuss any questions you have with your health care provider. Document Released: 01/16/2008 Document Revised: 03/18/2016 Document Reviewed: 04/23/2015 Elsevier Interactive Patient Education  [employer].

## 2017-05-20 NOTE — Assessment & Plan Note (Signed)
Stable. Using oxycodone very sparingly. Refill given today.

## 2017-05-20 NOTE — Assessment & Plan Note (Addendum)
Under fair control with A1c of 7.2. Continue current regimen. Continue to monitor. Call with any concerns.

## 2017-05-20 NOTE — Assessment & Plan Note (Signed)
Under good control. Continue current regimen. Continue to monitor. Call with any concerns. 

## 2017-05-20 NOTE — Progress Notes (Signed)
BP 129/80   Pulse 86   Ht 5\' 11"  (1.803 m)   Wt 188 lb (85.3 kg)   SpO2 98%   BMI 26.22 kg/m    Subjective:    Patient ID: Glenn Gerold., male    DOB: Jan 22, 1956, 61 y.o.   MRN: [id number]  HPI: Glenn Barrales. is a 61 y.o. male presenting on 05/20/2017 for comprehensive medical examination. Current medical complaints include:  HYPERTENSION / HYPERLIPIDEMIA Satisfied with current treatment? yes Duration of hypertension: chronic BP monitoring frequency: not checking BP range:  BP medication side effects: no Past BP meds: lisinopril-HCTZ Duration of hyperlipidemia: chronic Cholesterol medication side effects: no Cholesterol supplements: none Past cholesterol medications: simvastatin Medication compliance: fair compliance Aspirin: no Recent stressors: no Recurrent headaches: no Visual changes: no Palpitations: no Dyspnea: no Chest pain: no Lower extremity edema: no Dizzy/lightheaded: no  DIABETES Hypoglycemic episodes:no Polydipsia/polyuria: no Visual disturbance: no Chest pain: no Paresthesias: no Glucose Monitoring: no Taking Insulin?: no Blood Pressure Monitoring: not checking Retinal Examination: Not up to Date Foot Exam: Up to Date Diabetic Education: Not Completed Pneumovax: Up to Date Influenza: Up to Date Aspirin: no  He currently lives with: wife Interim Problems from his last visit: no  Depression Screen done today and results listed below:  Depression screen [facility] 2/9 05/20/2017  Decreased Interest 0  Down, Depressed, Hopeless 0  PHQ - 2 Score 0    Past Medical History:  Past Medical History:  Diagnosis Date  . Diabetes ([place])   . History of hernia repair   . Hypertension   . Stroke [facility])     Surgical History:  Past Surgical History:  Procedure Laterality Date  . APPENDECTOMY    . HERNIA REPAIR      Medications:  No current outpatient prescriptions on file prior to visit.   No current facility-administered medications  on file prior to visit.     Allergies:  Allergies  Allergen Reactions  . Lipitor [Atorvastatin] Rash  . Gabapentin Other (See Comments)    Other reaction(s): Dizziness    Social History:  Social History   Social History  . Marital status: Married    Spouse name: N/A  . Number of children: 0  . Years of education: N/A   Occupational History  . Not on file.   Social History Main Topics  . Smoking status: Current Every Day Smoker    Packs/day: 0.50    Years: 30.00    Types: Cigarettes  . Smokeless tobacco: Never Used  . Alcohol use 4.2 oz/week    7 Cans of beer per week     Comment: occasionally  . Drug use: No  . Sexual activity: Yes   Other Topics Concern  . Not on file   Social History Narrative  . No narrative on file   History  Smoking Status  . Current Every Day Smoker  . Packs/day: 0.50  . Years: 30.00  . Types: Cigarettes  Smokeless Tobacco  . Never Used   History  Alcohol Use  . 4.2 oz/week  . 7 Cans of beer per week    Comment: occasionally    Family History:  Family History  Problem Relation Age of Onset  . Diabetes Father   . Hypertension Father   . Hyperlipidemia Father     Past medical history, surgical history, medications, allergies, family history and social history reviewed with patient today and changes made to appropriate areas of the chart.   Review  of Systems  Constitutional: Negative.   HENT: Negative.   Eyes: Negative for blurred vision, double vision, photophobia, pain, discharge and redness.       Blindness   Respiratory: Negative.   Cardiovascular: Negative.   Gastrointestinal: Negative.   Genitourinary: Negative.   Musculoskeletal: Negative.   Skin: Negative.   Neurological: Positive for headaches. Negative for dizziness, tingling, tremors, sensory change, speech change, focal weakness, seizures and loss of consciousness.  Endo/Heme/Allergies: Negative.   Psychiatric/Behavioral: Negative.     All other ROS  negative except what is listed above and in the HPI.      Objective:    BP 129/80   Pulse 86   Ht 5\' 11"  (1.803 m)   Wt 188 lb (85.3 kg)   SpO2 98%   BMI 26.22 kg/m   Wt Readings from Last 3 Encounters:  05/20/17 188 lb (85.3 kg)  02/15/17 189 lb 1 oz (85.8 kg)  11/02/16 187 lb (84.8 kg)    Physical Exam  Constitutional: He is oriented to person, place, and time. He appears well-developed and well-nourished. No distress.  HENT:  Head: Normocephalic and atraumatic.  Right Ear: Hearing and external ear normal.  Left Ear: Hearing and external ear normal.  Nose: Nose normal.  Mouth/Throat: Oropharynx is clear and moist. No oropharyngeal exudate.  Eyes: Pupils are equal, round, and reactive to light. Conjunctivae, EOM and lids are normal. Right eye exhibits no discharge. Left eye exhibits no discharge. No scleral icterus.  Neck: Normal range of motion. Neck supple. No JVD present. No tracheal deviation present. No thyromegaly present.  Cardiovascular: Normal rate, regular rhythm, normal heart sounds and intact distal pulses.  Exam reveals no gallop and no friction rub.   No murmur heard. Pulmonary/Chest: Effort normal and breath sounds normal. No stridor. No respiratory distress. He has no wheezes. He has no rales. He exhibits no tenderness.  Abdominal: Soft. Bowel sounds are normal. He exhibits no distension and no mass. There is no tenderness. There is no rebound and no guarding.  Genitourinary:  Genitourinary Comments: GU exam deferred at patient's request  Musculoskeletal: Normal range of motion. He exhibits no edema, tenderness or deformity.  Lymphadenopathy:    He has no cervical adenopathy.  Neurological: He is alert and oriented to person, place, and time. He has normal reflexes. He displays normal reflexes. No cranial nerve deficit. He exhibits normal muscle tone. Coordination abnormal.  Skin: Skin is warm, dry and intact. No rash noted. He is not diaphoretic. No erythema. No  pallor.  Psychiatric: He has a normal mood and affect. His speech is normal and behavior is normal. Judgment and thought content normal. Cognition and memory are normal.  Nursing note and vitals reviewed.   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 Continue current regimen. Continue to monitor. Call with any concerns.       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 Continue to monitor. Call with any concerns.         Genitourinary   Benign hypertensive renal disease    Under good control. Continue current regimen. Continue to monitor. Call with any concerns.         Other   Hyperlipidemia    Under good control. Continue current regimen. Continue to monitor. Call with any concerns.       Relevant Medications   lisinopril-hydrochlorothiazide (PRINZIDE,ZESTORETIC) 10-12.5 MG tablet   simvastatin (ZOCOR) 40 MG tablet   Other Relevant Orders   CBC with Differential/Platelet   Comprehensive metabolic panel   Lipid Panel w/o Chol/HDL Ratio   TSH   UA/M w/rflx Culture, Routine   Tobacco abuse    Not interested in quitting. He knows we're here if he wants to quit or needs help.      Relevant Orders   CBC with Differential/Platelet    Comprehensive metabolic panel   TSH   UA/M w/rflx Culture, Routine   Chronic headaches    Stable. Using oxycodone very sparingly. Refill given today.      Relevant Medications   oxyCODONE (OXYCONTIN) 10 mg 12 hr tablet   History of stroke with residual deficit    No change. Not able to work. Continue to monitor.       Right homonymous hemianopsia    No change. Not able to work. Continue to monitor.       Relevant Orders   CBC with Differential/Platelet   Comprehensive metabolic panel   TSH   UA/M w/rflx Culture, Routine   Memory loss    No change. Not able to work. Continue to monitor.        Other Visit Diagnoses    Routine general medical examination at a health care facility    -  Primary   Vaccines up to date. Screening labs checked today. Checking on cologuard. Continue diet and exercise. Call with any concerns.    Screening for prostate cancer       Labs drawn today. Await results.    Relevant Orders   PSA       Discussed aspirin prophylaxis for myocardial infarction prevention and decision was it was not indicated- on plavix  LABORATORY TESTING:  Health maintenance labs ordered today as discussed above.   The natural history of prostate cancer and ongoing controversy regarding screening and potential treatment outcomes of prostate cancer has been discussed with the patient. The meaning of a false positive PSA and a false negative PSA has been discussed. He indicates understanding of the limitations of this screening test and wishes to proceed with screening PSA testing.   IMMUNIZATIONS:   - Tdap: Tetanus vaccination status reviewed: last tetanus booster within 10 years. - Influenza: Will get next time - Pneumovax: Up to date - Prevnar: Not applicable - Zostavax vaccine: Up to date  SCREENING: - Colonoscopy: Refused  Discussed with patient purpose of the colonoscopy is to detect colon cancer at curable precancerous or early stages   PATIENT COUNSELING:      Sexuality: Discussed sexually transmitted diseases, partner selection, use of condoms, avoidance of unintended pregnancy  and contraceptive alternatives.   Advised to avoid cigarette smoking.  I discussed with the patient that most  people either abstain from alcohol or drink within safe limits (<=14/week and <=4 drinks/occasion for males, <=7/weeks and <= 3 drinks/occasion for females) and that the risk for alcohol disorders and other health effects rises proportionally with the number of drinks per week and how often a drinker exceeds daily limits.  Discussed cessation/primary prevention of drug use and availability of treatment for abuse.   Diet: Encouraged to adjust caloric intake to maintain  or achieve ideal body weight, to reduce intake of dietary saturated fat and total fat, to limit sodium intake by avoiding high sodium foods and not adding table salt, and to maintain adequate dietary potassium and calcium preferably from fresh fruits, vegetables, and low-fat dairy products.    stressed the importance of regular exercise  Injury prevention: Discussed safety belts, safety helmets, smoke detector, smoking near bedding or upholstery.   Dental health: Discussed importance of regular tooth brushing, flossing, and dental visits.   Follow up plan: NEXT PREVENTATIVE PHYSICAL DUE IN 1 YEAR. Return in about 3 months (around 08/20/2017) for DM visit.

## 2017-05-20 NOTE — Assessment & Plan Note (Signed)
Stable. Continue to monitor. Call with any concerns.  ?

## 2017-05-21 LAB — CBC WITH DIFFERENTIAL/PLATELET
BASOS: 0 %
Basophils Absolute: 0 10*3/uL (ref 0.0–0.2)
EOS (ABSOLUTE): 0.6 10*3/uL — AB (ref 0.0–0.4)
EOS: 6 %
Hematocrit: 46.2 % (ref 37.5–51.0)
Hemoglobin: 16 g/dL (ref 13.0–17.7)
Immature Grans (Abs): 0 10*3/uL (ref 0.0–0.1)
Immature Granulocytes: 0 %
LYMPHS: 22 %
Lymphocytes Absolute: 2.1 10*3/uL (ref 0.7–3.1)
MCH: 30.1 pg (ref 26.6–33.0)
MCHC: 34.6 g/dL (ref 31.5–35.7)
MCV: 87 fL (ref 79–97)
MONOCYTES: 6 %
Monocytes Absolute: 0.6 10*3/uL (ref 0.1–0.9)
Neutrophils Absolute: 6.2 10*3/uL (ref 1.4–7.0)
Neutrophils: 66 %
PLATELETS: 297 10*3/uL (ref 150–379)
RBC: 5.31 x10E6/uL (ref 4.14–5.80)
RDW: 13.5 % (ref 12.3–15.4)
WBC: 9.6 10*3/uL (ref 3.4–10.8)

## 2017-05-21 LAB — COMPREHENSIVE METABOLIC PANEL
A/G RATIO: 1.9 (ref 1.2–2.2)
ALT: 36 IU/L (ref 0–44)
AST: 29 IU/L (ref 0–40)
Albumin: 4.9 g/dL — ABNORMAL HIGH (ref 3.6–4.8)
Alkaline Phosphatase: 90 IU/L (ref 39–117)
BILIRUBIN TOTAL: 0.5 mg/dL (ref 0.0–1.2)
BUN/Creatinine Ratio: 12 (ref 10–24)
BUN: 9 mg/dL (ref 8–27)
CHLORIDE: 96 mmol/L (ref 96–106)
CO2: 26 mmol/L (ref 20–29)
Calcium: 10.6 mg/dL — ABNORMAL HIGH (ref 8.6–10.2)
Creatinine, Ser: 0.75 mg/dL — ABNORMAL LOW (ref 0.76–1.27)
GFR calc non Af Amer: 98 mL/min/{1.73_m2} (ref >59)
GFR, EST AFRICAN AMERICAN: 114 mL/min/{1.73_m2} (ref >59)
GLOBULIN, TOTAL: 2.6 g/dL (ref 1.5–4.5)
Glucose: 139 mg/dL — ABNORMAL HIGH (ref 65–99)
POTASSIUM: 4.7 mmol/L (ref 3.5–5.2)
SODIUM: 138 mmol/L (ref 134–144)
Total Protein: 7.5 g/dL (ref 6.0–8.5)

## 2017-05-21 LAB — LIPID PANEL W/O CHOL/HDL RATIO
Cholesterol, Total: 174 mg/dL (ref 100–199)
HDL: 52 mg/dL (ref >39)
LDL Calculated: 85 mg/dL (ref 0–99)
TRIGLYCERIDES: 185 mg/dL — AB (ref 0–149)
VLDL Cholesterol Cal: 37 mg/dL (ref 5–40)

## 2017-05-21 LAB — TSH: TSH: 1.96 u[IU]/mL (ref 0.450–4.500)

## 2017-05-21 LAB — PSA: Prostate Specific Ag, Serum: 0.5 ng/mL (ref 0.0–4.0)

## 2017-05-25 ENCOUNTER — Telehealth: Payer: Self-pay

## 2017-05-25 NOTE — Telephone Encounter (Signed)
-----   Message from Sandria Manly, [place] sent at 05/24/2017  2:06 PM EDT ----- Could you please try trying her as well?  ----- Message ----- From: Jerene Pitch, CMA Sent: 05/24/2017   2:01 PM To: Sandria Manly, CMA  Keri: Please let me know when the patients wife is notified.  ----- Message ----- From: [name], DO Sent: 05/24/2017   9:33 AM To: [place], CMA  Can we try her later? ----- Message ----- From: Sandria Manly, CMA Sent: 05/24/2017   9:28 AM To: [name], DO  Patient's cologuard is a covered benefit, however he'll still have to meet his deductible and out of pocket with it. I told tiff and she asked me to call patient's wife and I tried and there was no answer.   ----- Message ----- From: Jerene Pitch, CMA Sent: 05/24/2017   9:20 AM To: Sandria Manly, CMA  Was this before we talked? ----- Message ----- From: Sandria Manly, CMA Sent: 05/24/2017   9:04 AM To: Jeffory Snelgrove L Sherrol Vicars, CMA  According to the website, patient is not in the system. So there's no order for patient to get cologuard.  ----- Message ----- From: Jerene Pitch, CMA Sent: 05/24/2017   8:44 AM To: Sandria Manly, CMA    ----- Message ----- From: [name], DO Sent: 05/20/2017   3:47 PM To: Charmon Thorson L Zyren Sevigny, CMA  Can we check on cologuard for [facility]?

## 2017-05-25 NOTE — Telephone Encounter (Signed)
Called and left a message for patient's wife to let her know that it is covered, but he will have to meet his deductible first.  Will wait to see if they want [place] to order the Cologuard.

## 2017-05-27 ENCOUNTER — Telehealth: Payer: Self-pay | Admitting: *Deleted

## 2017-05-27 NOTE — Telephone Encounter (Signed)
Copied from [place] [id number]. Topic: Inquiry >> May 27, 2017  1:12 PM Cecelia Byars, NT wrote: Reason for CRM: called Tiffany back wanting info about medicare dates coverage

## 2017-05-31 NOTE — Telephone Encounter (Signed)
Unable to reach patient, will close this encounter.  °

## 2017-06-21 ENCOUNTER — Emergency Department: Payer: BLUE CROSS/BLUE SHIELD

## 2017-06-21 ENCOUNTER — Inpatient Hospital Stay
Admission: EM | Admit: 2017-06-21 | Discharge: 2017-06-23 | DRG: 065 | Disposition: A | Discharge status: Home Health | Payer: BLUE CROSS/BLUE SHIELD | Attending: Internal Medicine | Admitting: Internal Medicine

## 2017-06-21 ENCOUNTER — Ambulatory Visit: Payer: BLUE CROSS/BLUE SHIELD | Admitting: Family Medicine

## 2017-06-21 ENCOUNTER — Other Ambulatory Visit: Payer: Self-pay

## 2017-06-21 ENCOUNTER — Inpatient Hospital Stay: Payer: BLUE CROSS/BLUE SHIELD

## 2017-06-21 ENCOUNTER — Encounter: Payer: Self-pay | Admitting: Family Medicine

## 2017-06-21 ENCOUNTER — Encounter: Payer: Self-pay | Admitting: Radiology

## 2017-06-21 VITALS — BP 134/82 | HR 178

## 2017-06-21 DIAGNOSIS — I10 Essential (primary) hypertension: Secondary | ICD-10-CM | POA: Diagnosis present

## 2017-06-21 DIAGNOSIS — R079 Chest pain, unspecified: Secondary | ICD-10-CM | POA: Diagnosis not present

## 2017-06-21 DIAGNOSIS — R590 Localized enlarged lymph nodes: Secondary | ICD-10-CM | POA: Diagnosis present

## 2017-06-21 DIAGNOSIS — I69351 Hemiplegia and hemiparesis following cerebral infarction affecting right dominant side: Secondary | ICD-10-CM

## 2017-06-21 DIAGNOSIS — F17210 Nicotine dependence, cigarettes, uncomplicated: Secondary | ICD-10-CM | POA: Diagnosis present

## 2017-06-21 DIAGNOSIS — Z9119 Patient's noncompliance with other medical treatment and regimen: Secondary | ICD-10-CM | POA: Diagnosis not present

## 2017-06-21 DIAGNOSIS — I6523 Occlusion and stenosis of bilateral carotid arteries: Secondary | ICD-10-CM | POA: Diagnosis present

## 2017-06-21 DIAGNOSIS — R911 Solitary pulmonary nodule: Secondary | ICD-10-CM | POA: Diagnosis present

## 2017-06-21 DIAGNOSIS — I700 Atherosclerosis of aorta: Secondary | ICD-10-CM | POA: Diagnosis present

## 2017-06-21 DIAGNOSIS — R000 Tachycardia, unspecified: Secondary | ICD-10-CM

## 2017-06-21 DIAGNOSIS — Z7984 Long term (current) use of oral hypoglycemic drugs: Secondary | ICD-10-CM | POA: Diagnosis not present

## 2017-06-21 DIAGNOSIS — I6340 Cerebral infarction due to embolism of unspecified cerebral artery: Principal | ICD-10-CM | POA: Diagnosis present

## 2017-06-21 DIAGNOSIS — E785 Hyperlipidemia, unspecified: Secondary | ICD-10-CM | POA: Diagnosis present

## 2017-06-21 DIAGNOSIS — F0150 Vascular dementia without behavioral disturbance: Secondary | ICD-10-CM | POA: Diagnosis present

## 2017-06-21 DIAGNOSIS — M6281 Muscle weakness (generalized): Secondary | ICD-10-CM | POA: Diagnosis not present

## 2017-06-21 DIAGNOSIS — G8191 Hemiplegia, unspecified affecting right dominant side: Secondary | ICD-10-CM

## 2017-06-21 DIAGNOSIS — E1151 Type 2 diabetes mellitus with diabetic peripheral angiopathy without gangrene: Secondary | ICD-10-CM | POA: Diagnosis present

## 2017-06-21 DIAGNOSIS — Z7902 Long term (current) use of antithrombotics/antiplatelets: Secondary | ICD-10-CM

## 2017-06-21 DIAGNOSIS — E119 Type 2 diabetes mellitus without complications: Secondary | ICD-10-CM | POA: Diagnosis not present

## 2017-06-21 DIAGNOSIS — I7090 Unspecified atherosclerosis: Secondary | ICD-10-CM

## 2017-06-21 DIAGNOSIS — I69311 Memory deficit following cerebral infarction: Secondary | ICD-10-CM

## 2017-06-21 DIAGNOSIS — K8020 Calculus of gallbladder without cholecystitis without obstruction: Secondary | ICD-10-CM | POA: Diagnosis present

## 2017-06-21 DIAGNOSIS — Z888 Allergy status to other drugs, medicaments and biological substances status: Secondary | ICD-10-CM | POA: Diagnosis not present

## 2017-06-21 DIAGNOSIS — K5730 Diverticulosis of large intestine without perforation or abscess without bleeding: Secondary | ICD-10-CM | POA: Diagnosis present

## 2017-06-21 DIAGNOSIS — I639 Cerebral infarction, unspecified: Secondary | ICD-10-CM | POA: Diagnosis not present

## 2017-06-21 DIAGNOSIS — Z23 Encounter for immunization: Secondary | ICD-10-CM

## 2017-06-21 DIAGNOSIS — R29898 Other symptoms and signs involving the musculoskeletal system: Secondary | ICD-10-CM

## 2017-06-21 DIAGNOSIS — I4891 Unspecified atrial fibrillation: Secondary | ICD-10-CM | POA: Diagnosis present

## 2017-06-21 DIAGNOSIS — R29706 NIHSS score 6: Secondary | ICD-10-CM | POA: Diagnosis present

## 2017-06-21 LAB — DIFFERENTIAL
BASOS PCT: 1 %
Basophils Absolute: 0.1 10*3/uL (ref 0–0.1)
EOS ABS: 0.4 10*3/uL (ref 0–0.7)
EOS PCT: 3 %
Lymphocytes Relative: 17 %
Lymphs Abs: 2.1 10*3/uL (ref 1.0–3.6)
MONO ABS: 1 10*3/uL (ref 0.2–1.0)
MONOS PCT: 8 %
Neutro Abs: 9.4 10*3/uL — ABNORMAL HIGH (ref 1.4–6.5)
Neutrophils Relative %: 71 %

## 2017-06-21 LAB — GLUCOSE, CAPILLARY
Glucose-Capillary: 188 mg/dL — ABNORMAL HIGH (ref 65–99)
Glucose-Capillary: 161 mg/dL — ABNORMAL HIGH (ref 65–99)

## 2017-06-21 LAB — BASIC METABOLIC PANEL
ANION GAP: 16 — AB (ref 5–15)
BUN: 11 mg/dL (ref 6–20)
CALCIUM: 10.2 mg/dL (ref 8.9–10.3)
CHLORIDE: 98 mmol/L — AB (ref 101–111)
CO2: 20 mmol/L — ABNORMAL LOW (ref 22–32)
CREATININE: 0.72 mg/dL (ref 0.61–1.24)
GFR calc non Af Amer: >60 mL/min (ref >60)
Glucose, Bld: 171 mg/dL — ABNORMAL HIGH (ref 65–99)
Potassium: 3.7 mmol/L (ref 3.5–5.1)
SODIUM: 134 mmol/L — AB (ref 135–145)

## 2017-06-21 LAB — PROTIME-INR
INR: 1.05
Prothrombin Time: 13.6 seconds (ref 11.4–15.2)

## 2017-06-21 LAB — URINALYSIS, ROUTINE W REFLEX MICROSCOPIC
Bacteria, UA: NONE SEEN
Bilirubin Urine: NEGATIVE
Glucose, UA: >=500 mg/dL — AB
Hgb urine dipstick: NEGATIVE
KETONES UR: 5 mg/dL — AB
LEUKOCYTES UA: NEGATIVE
Nitrite: NEGATIVE
PH: 6 (ref 5.0–8.0)
Protein, ur: NEGATIVE mg/dL
SPECIFIC GRAVITY, URINE: 1.036 — AB (ref 1.005–1.030)
SQUAMOUS EPITHELIAL / LPF: NONE SEEN

## 2017-06-21 LAB — CBC
HEMATOCRIT: 49 % (ref 40.0–52.0)
HEMOGLOBIN: 16.9 g/dL (ref 13.0–18.0)
MCH: 30.4 pg (ref 26.0–34.0)
MCHC: 34.4 g/dL (ref 32.0–36.0)
MCV: 88.4 fL (ref 80.0–100.0)
Platelets: 363 10*3/uL (ref 150–440)
RBC: 5.55 MIL/uL (ref 4.40–5.90)
RDW: 13.4 % (ref 11.5–14.5)
WBC: 12.6 10*3/uL — AB (ref 3.8–10.6)

## 2017-06-21 LAB — URINE DRUG SCREEN, QUALITATIVE (ARMC ONLY)
Amphetamines, Ur Screen: NOT DETECTED
BARBITURATES, UR SCREEN: NOT DETECTED
BENZODIAZEPINE, UR SCRN: NOT DETECTED
CANNABINOID 50 NG, UR ~~LOC~~: NOT DETECTED
Cocaine Metabolite,Ur ~~LOC~~: NOT DETECTED
MDMA (Ecstasy)Ur Screen: NOT DETECTED
METHADONE SCREEN, URINE: NOT DETECTED
OPIATE, UR SCREEN: NOT DETECTED
Phencyclidine (PCP) Ur S: NOT DETECTED
Tricyclic, Ur Screen: NOT DETECTED

## 2017-06-21 LAB — APTT: APTT: 66 s — AB (ref 24–36)

## 2017-06-21 LAB — TROPONIN I: Troponin I: <0.03 ng/mL (ref <0.03)

## 2017-06-21 LAB — MRSA PCR SCREENING: MRSA by PCR: NEGATIVE

## 2017-06-21 LAB — ETHANOL

## 2017-06-21 IMAGING — CT CT HEAD W/O CM
3 series · 15 of 47 positions shown, 18 images · IV contrast (iopamidol)
Comparison: 04/29/2015

CLINICAL DATA: Pt reports Right foot going numb last night, unknown
as to what time. Pt also has hx of diabetes. Pt thinks numbness
started around 9pm. Pt states he cannot lift his R leg at all at
this time. Decreased right lower ext pulse a...*comment was
truncated*^125mL FFNRW9-XLE IOPAMIDOL (FFNRW9-XLE) INJECTION 76%

EXAM:
CT HEAD WITHOUT CONTRAST
TECHNIQUE: Contiguous axial images were obtained from the base of the skull
through the vertex without intravenous contrast.

[Series 2: head wo · axial · 0.40mm/px · z∈[+570,+705]mm · 9 of 33 slices shown, 12 images]
[im 3/33  brain]
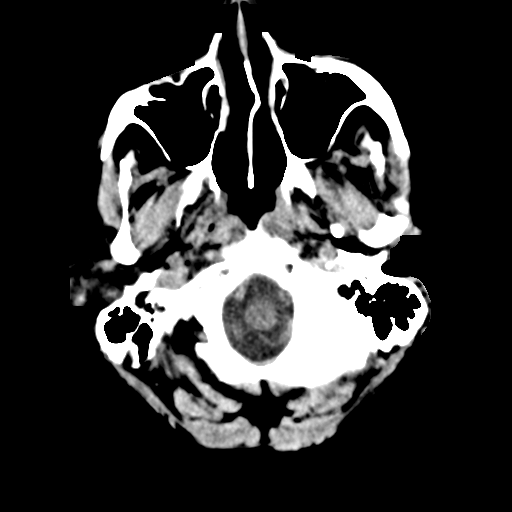
[im 3/33  bone]
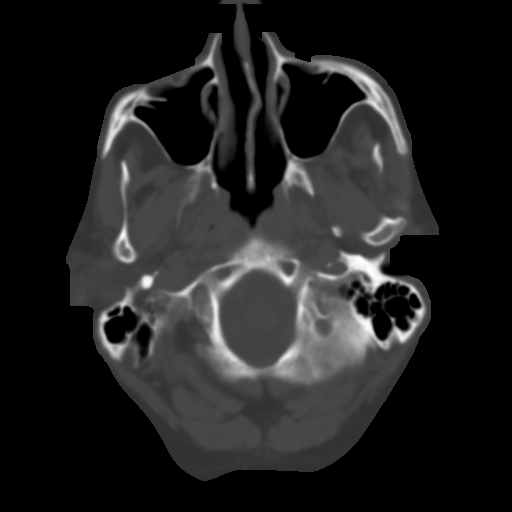
[im 6/33  brain]
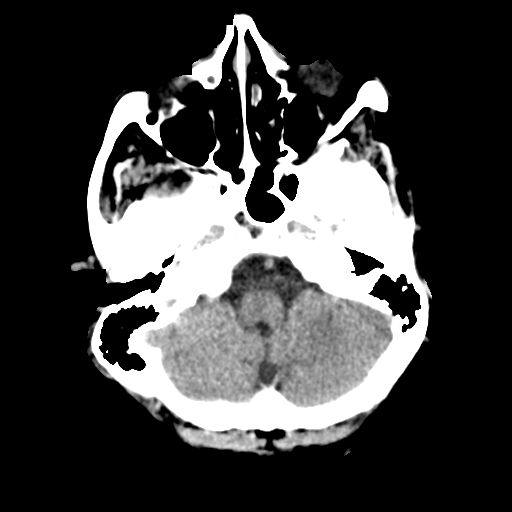
[im 9/33  brain]
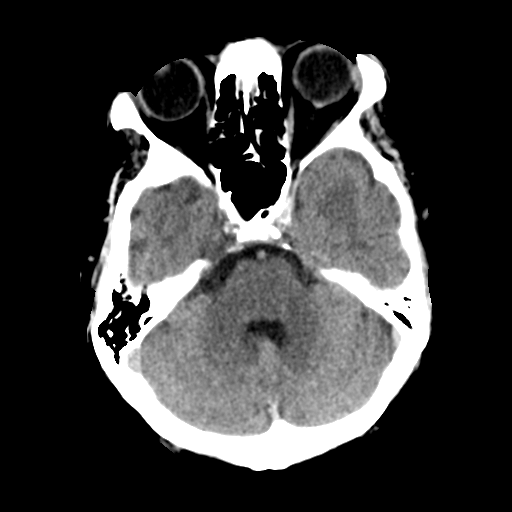
[im 13/33  brain]
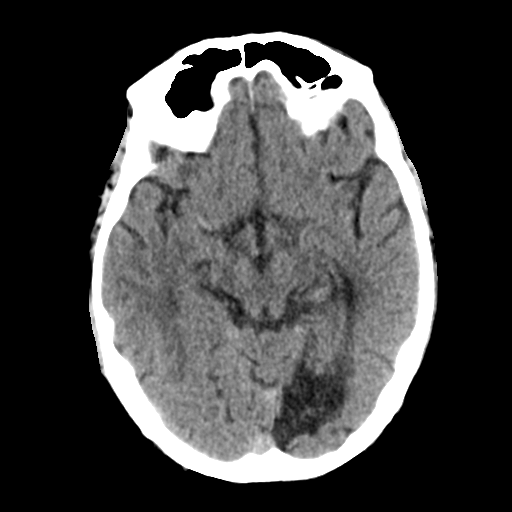
[im 17/33  brain]
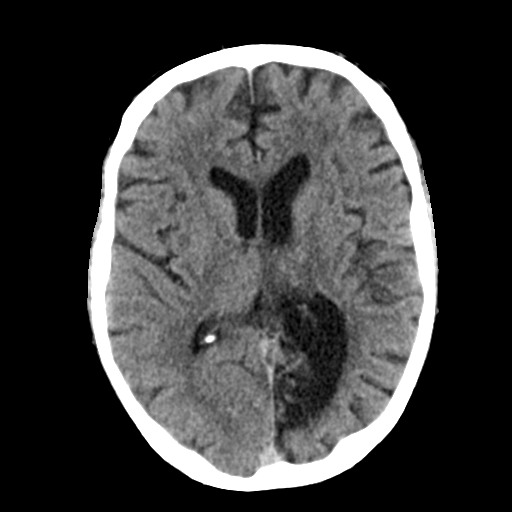
[im 17/33  bone]
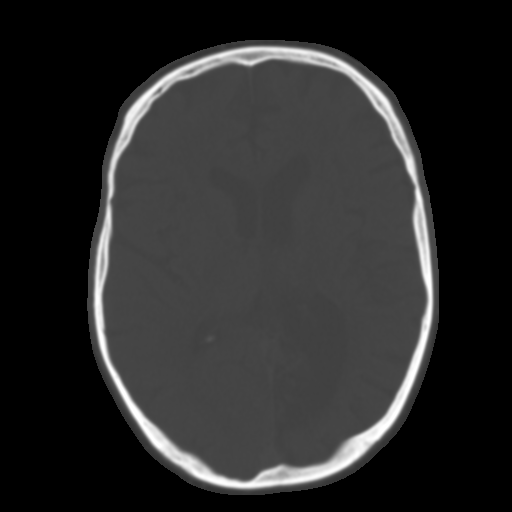
[im 20/33  brain]
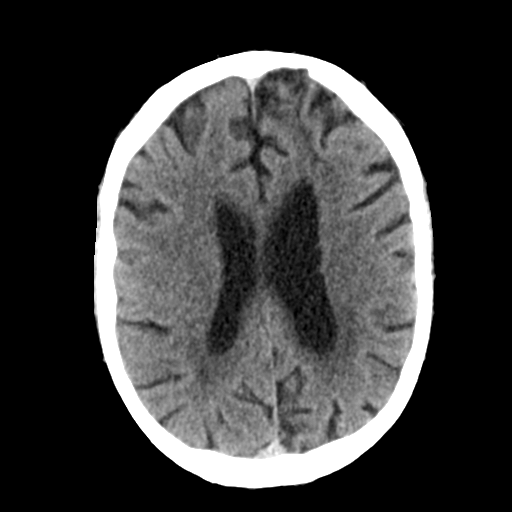
[im 24/33  brain]
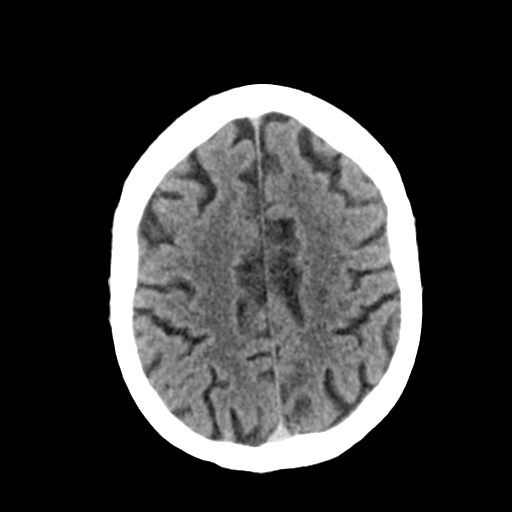
[im 27/33  brain]
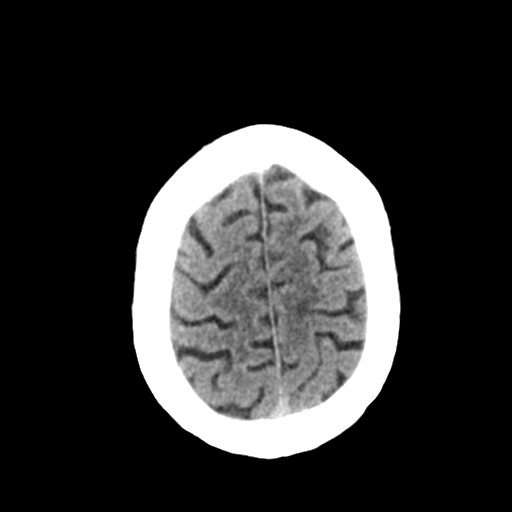
[im 30/33  brain]
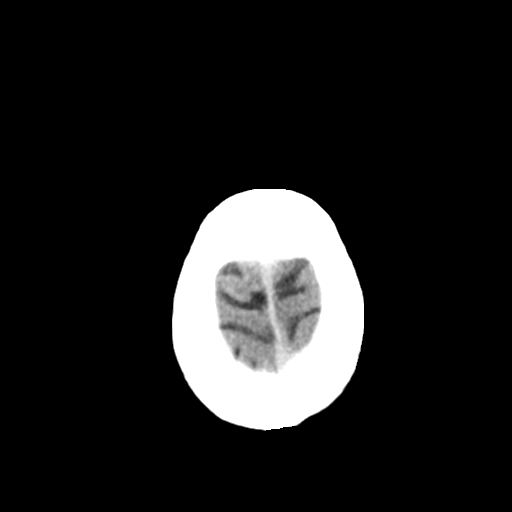
[im 30/33  bone]
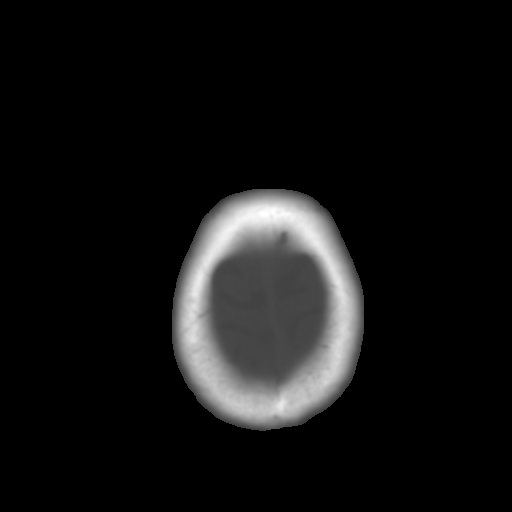

[Series 4: coronal soft tissue · coronal · 0.32mm/px · 3 of 66 slices shown]
[im 22/66  brain]
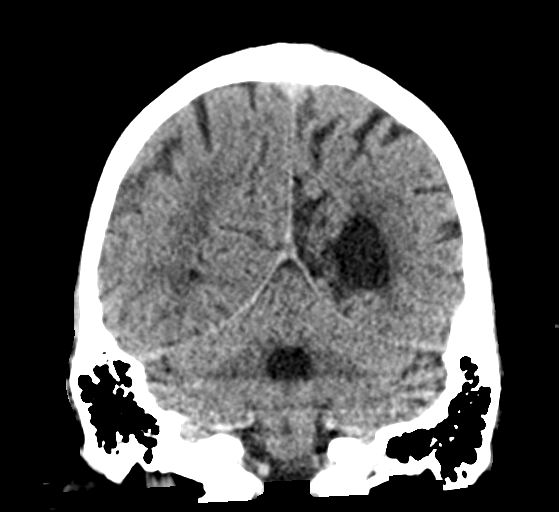
[im 29/66  brain]
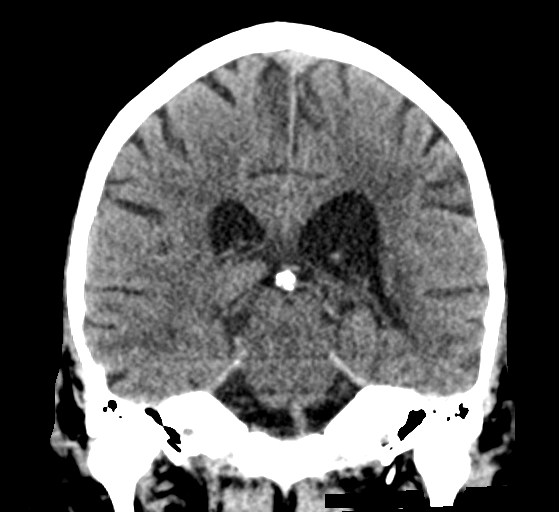
[im 37/66  brain]
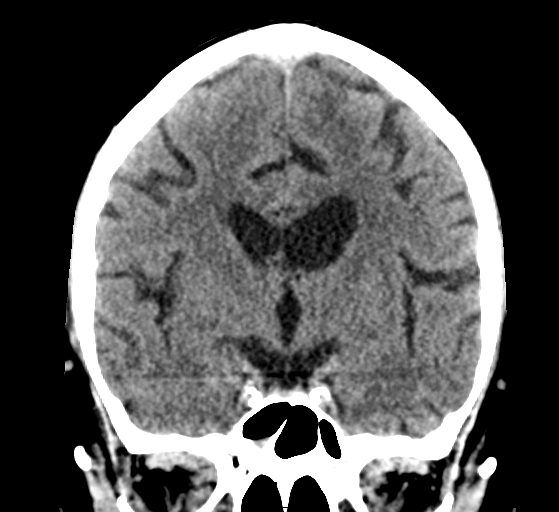

[Series 5: sagittal soft tissue · sagittal · 0.32mm/px · 3 of 54 slices shown]
[im 18/54  brain]
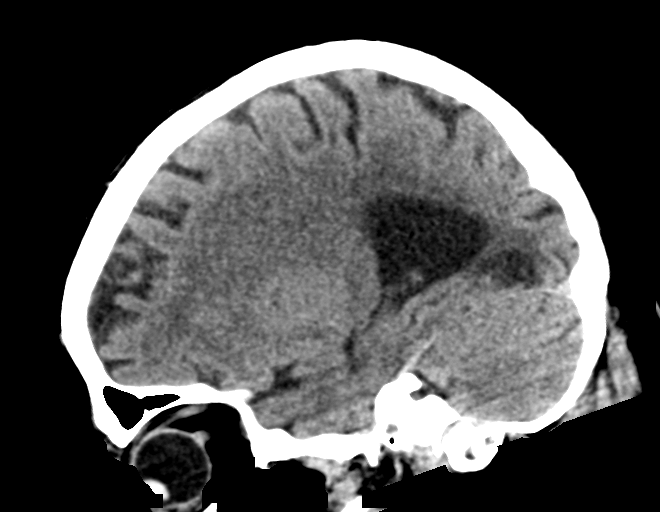
[im 27/54  brain]
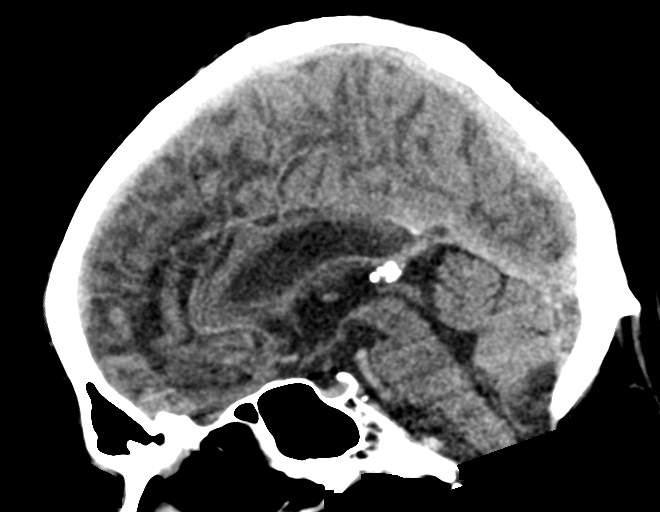
[im 36/54  brain]
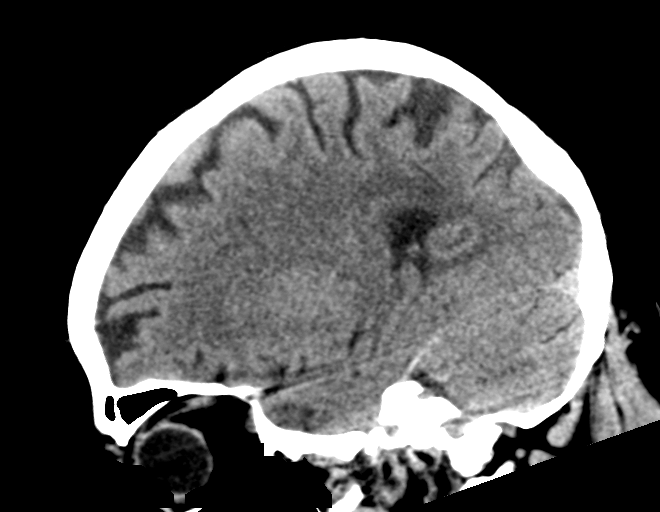

[15 of 47 positions shown; findings below may reference images not displayed]

FINDINGS: Brain: No acute intracranial hemorrhage. No focal mass lesion. No CT
evidence of acute infarction. No midline shift or mass effect. No
hydrocephalus. Basilar cisterns are patent.

Remote LEFT occipital infarction with encephalomalacia.

Vascular: No hyperdense vessel or unexpected calcification.

Skull: Normal. Negative for fracture or focal lesion.

Sinuses/Orbits: Paranasal sinuses and mastoid air cells are clear.
Orbits are clear.

Other: None.
IMPRESSION: 1. No acute intracranial findings.
2. Remote LEFT occipital infarction.

## 2017-06-21 IMAGING — DX DG CHEST 1V PORT
1 series · 1 of 1 positions shown · non-contrast
Comparison: None.

CLINICAL DATA: Tachycardia.  Sweating.

EXAM:
PORTABLE CHEST 1 VIEW

[chest ap]
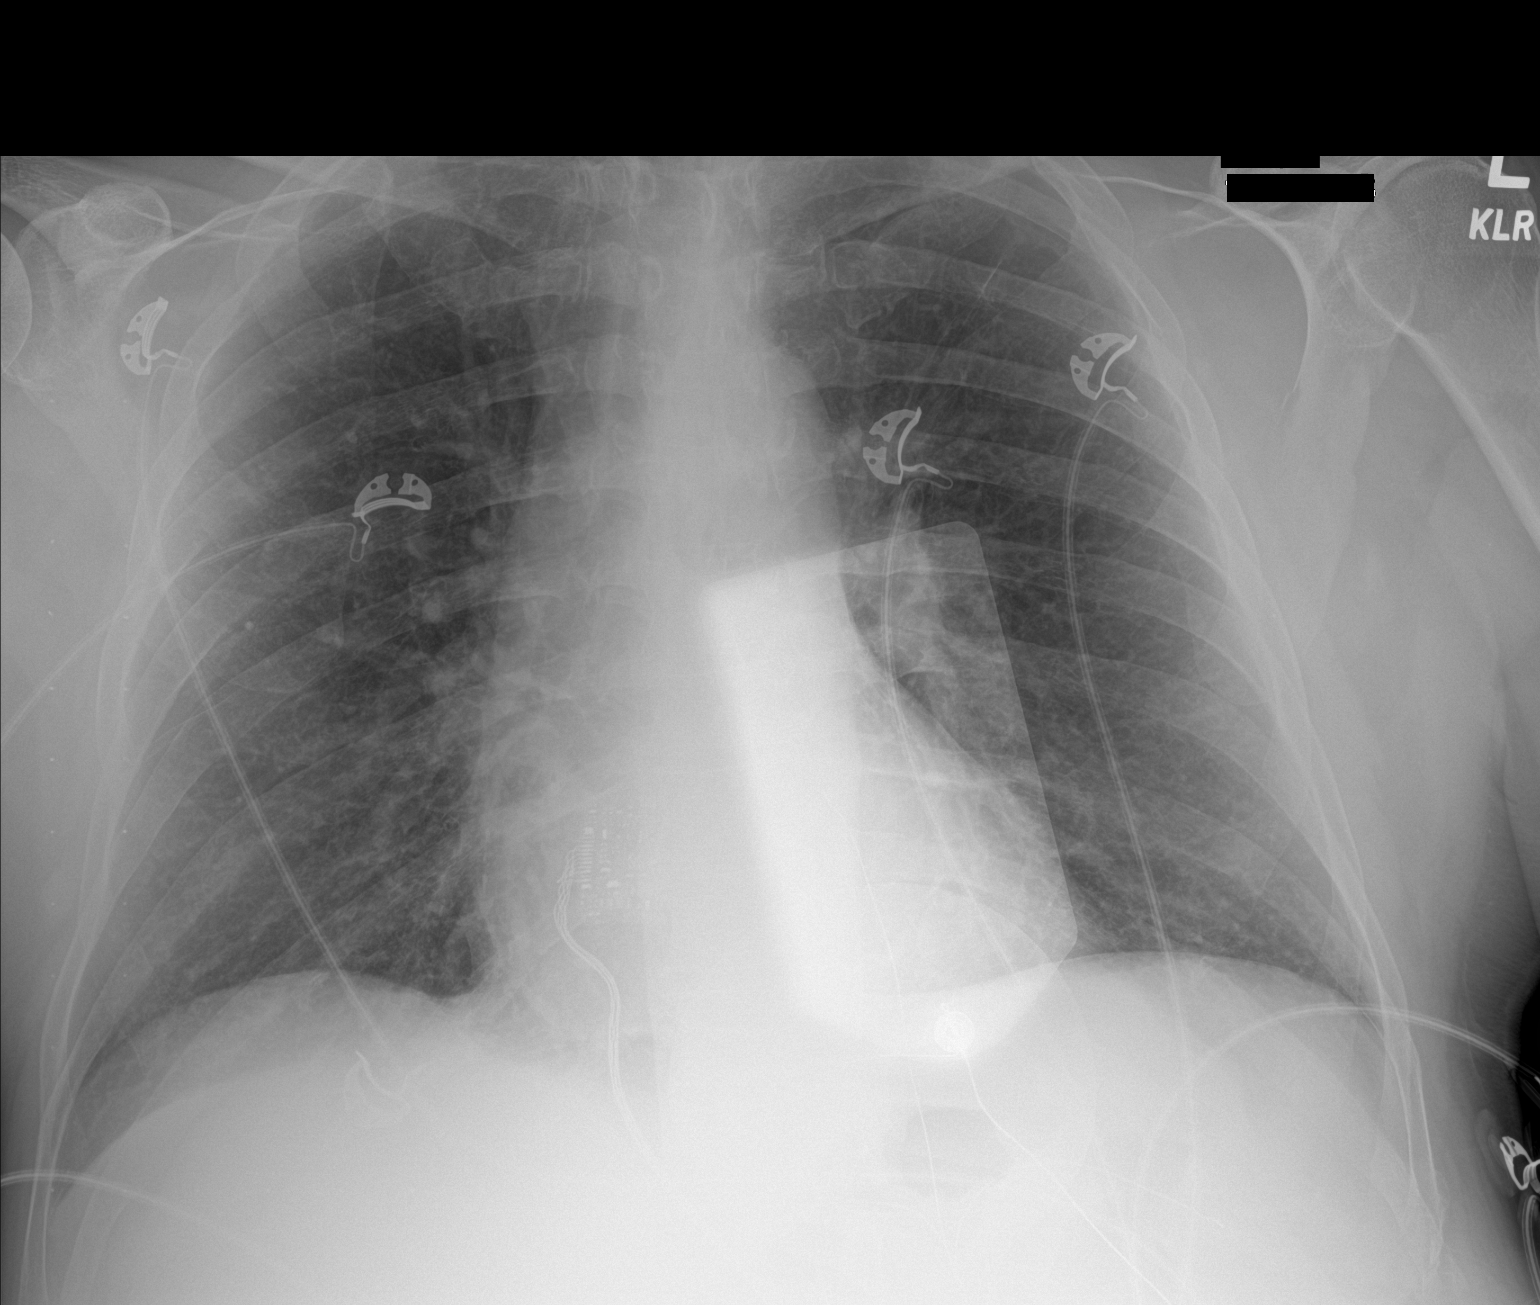

[1 of 1 positions shown; findings below may reference images not displayed]

FINDINGS: The heart size and mediastinal contours are within normal limits.
Both lungs are clear. The visualized skeletal structures are
unremarkable.
IMPRESSION: Normal exam.

## 2017-06-21 IMAGING — CT CT ANGIO AOBIFEM WO/W CM
2 of 11 series · 11 of 46 positions shown, 13 images · IV contrast (APPLIED)
Comparison: None.

CLINICAL DATA: Right foot numbness and cold to touch

EXAM:
CT ANGIOGRAPHY AOBIFEM WITHOUT AND WITH CONTRAST
TECHNIQUE: Using angiographic technique, postcontrast CT images of the abdomen
and pelvis and lower extremities were obtained, from the lung bases
through both feet.
CONTRAST:  125mL [id number] IOPAMIDOL ([id number]) INJECTION 76%

[Series 4: axial arterial upper · axial · arterial · 0.65mm/px · z∈[-804,-30]mm · 9 of 306 slices shown, 11 images]
[im 24/306  soft-tissue]
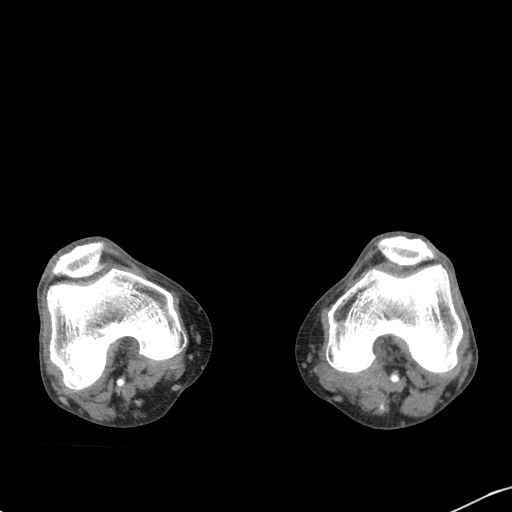
[im 24/306  bone]
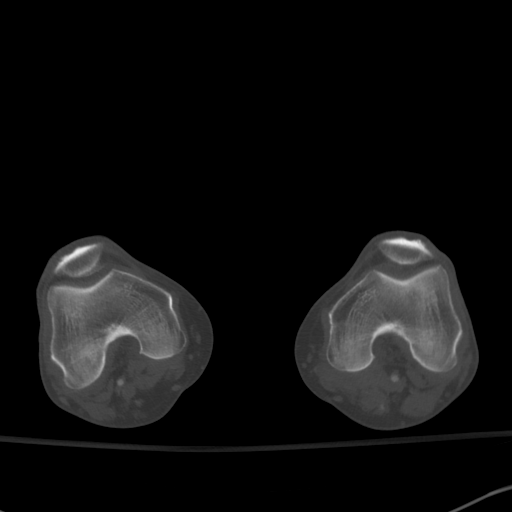
[im 71/306  soft-tissue]
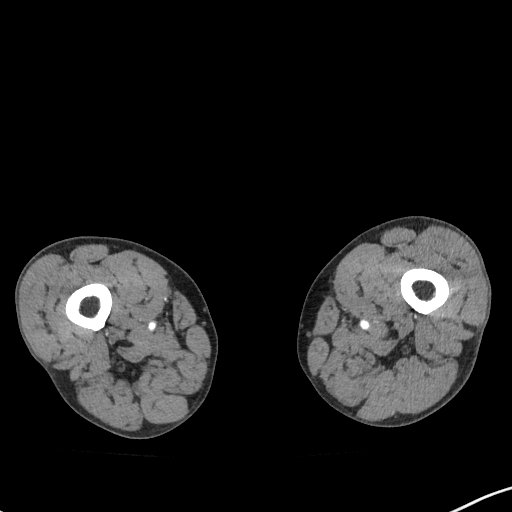
[im 94/306  soft-tissue]
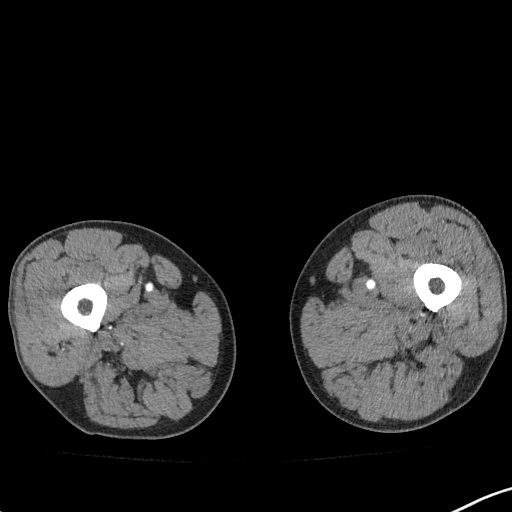
[im 118/306  soft-tissue]
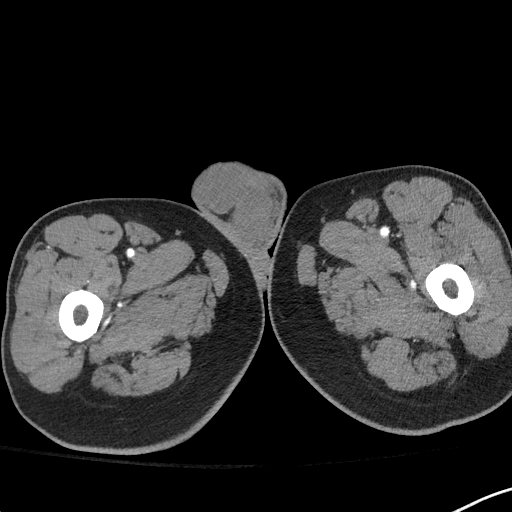
[im 165/306  soft-tissue]
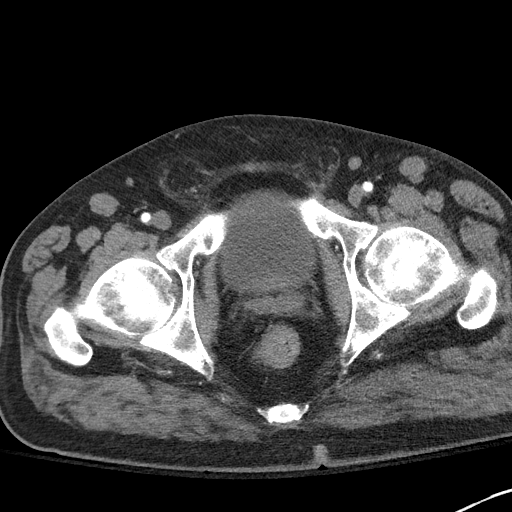
[im 188/306  soft-tissue]
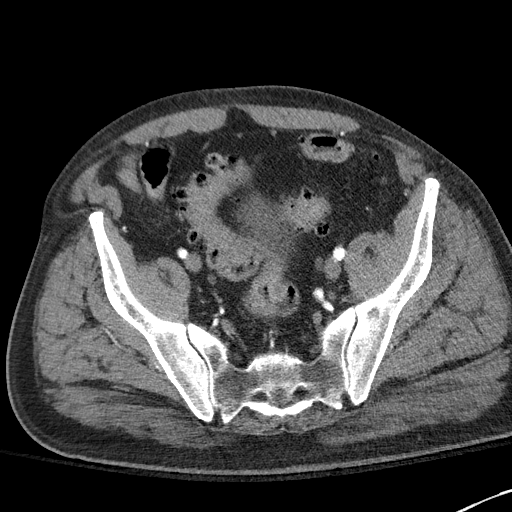
[im 212/306  soft-tissue]
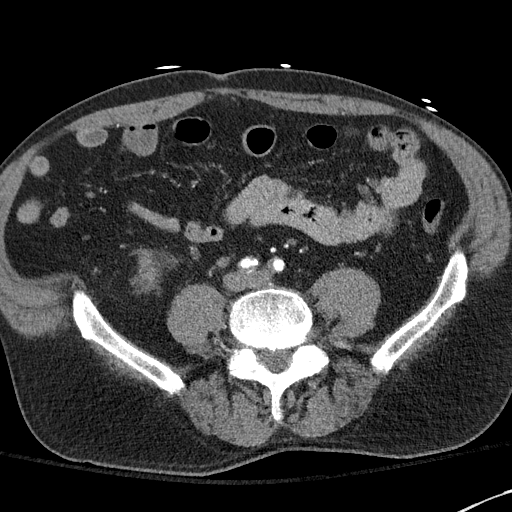
[im 259/306  soft-tissue]
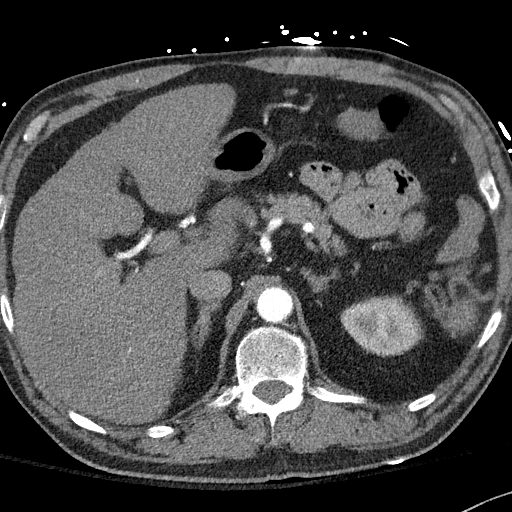
[im 282/306  soft-tissue]
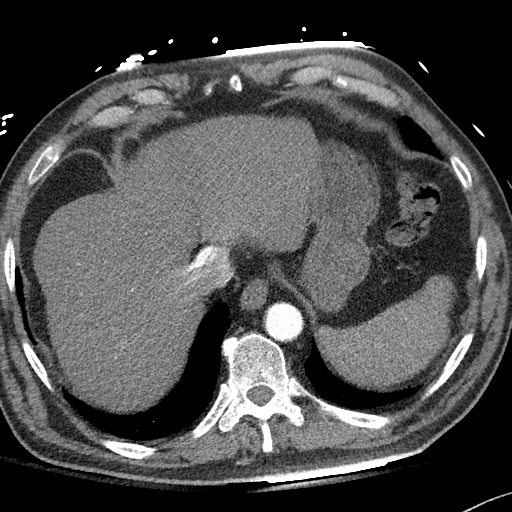
[im 282/306  bone]
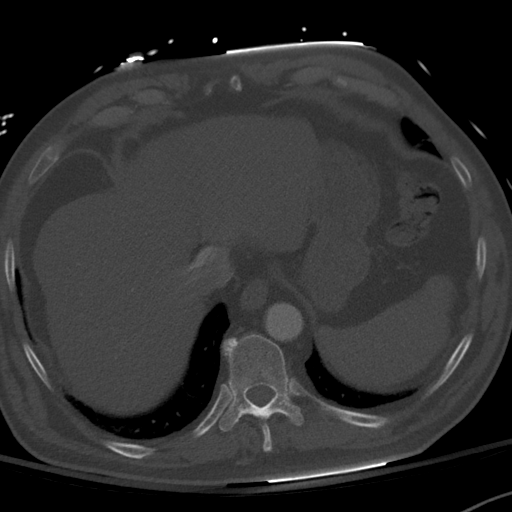

[Series 6: coronal upper · coronal · 0.68mm/px · 2 of 131 slices shown]
[im 44/131  soft-tissue]
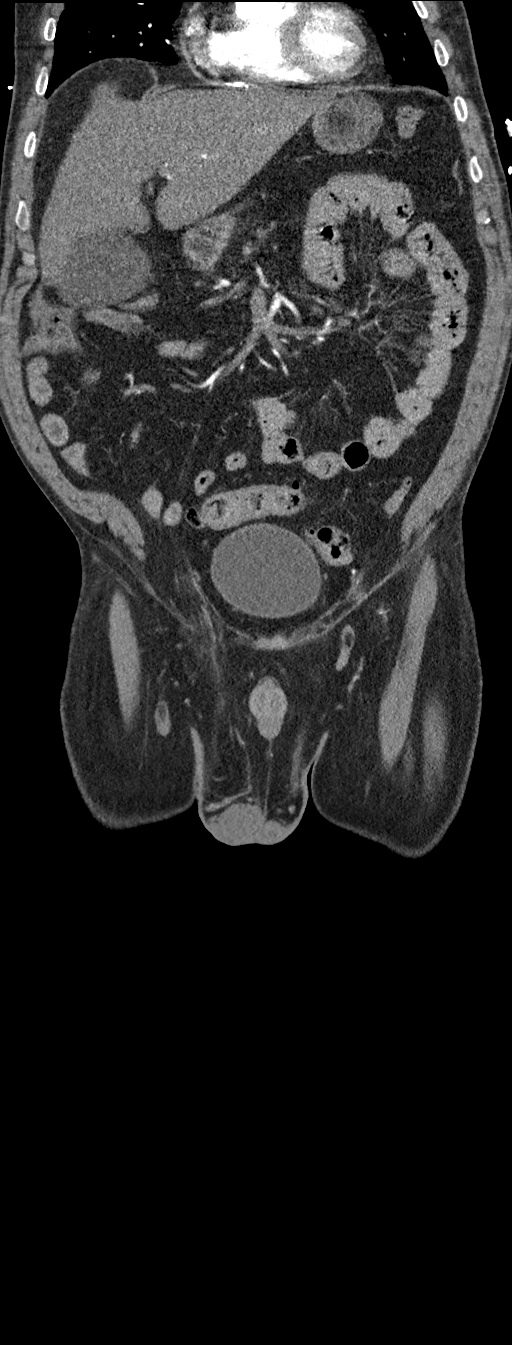
[im 87/131  soft-tissue]
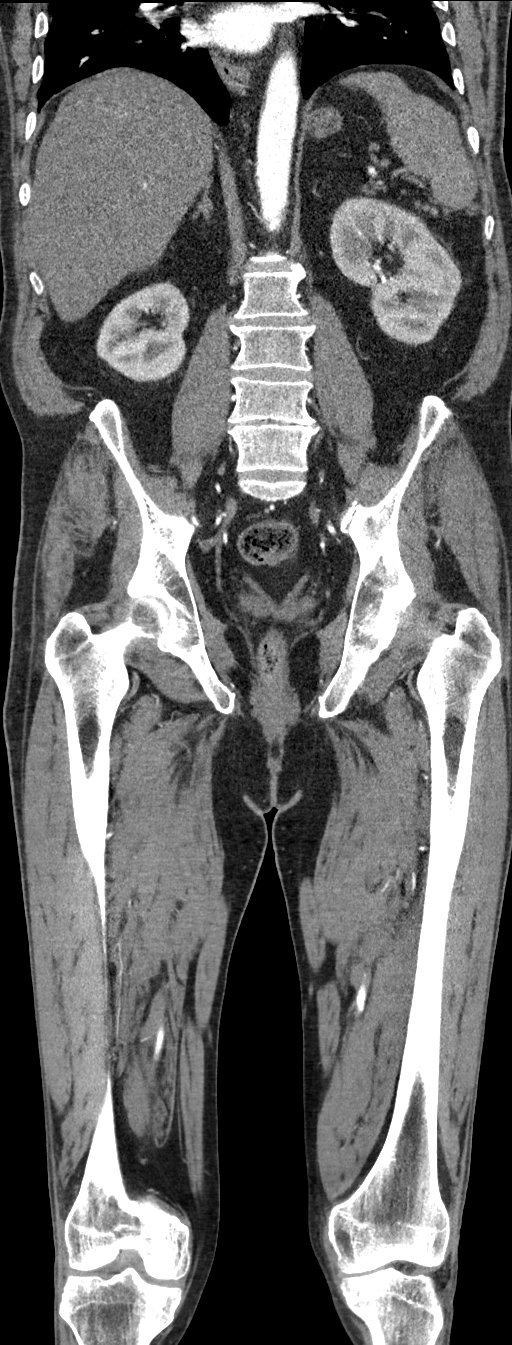

[11 of 46 positions shown; findings below may reference images not displayed]

FINDINGS: VASCULAR FINDINGS

Aorta: Normal caliber distal thoracic and abdominal aorta. There is
moderate calcified and noncalcified plaque in the infrarenal aorta
without hemodynamically significant stenosis.

Celiac axis:  Normal.

Superior mesenteric artery: Normal.

Renal arteries: Single renal arteries bilaterally. No stenosis or
other abnormality.

Inferior mesenteric artery:  Normal.

Inflow: There is severe, predominantly noncalcified plaque within
the right common iliac artery that causes severe stenosis just
proximal to the right iliac bifurcation. The right external iliac
artery is widely patent. There is short segment occlusion of the
right internal iliac artery which reconstitutes distally. There is
moderate atherosclerotic noncalcified plaque in the left common
iliac artery without hemodynamically significant stenosis. Mild
atherosclerosis of the left internal and external iliac arteries
without hemodynamically significant stenosis.

RIGHT LOWER EXTREMITY

Femoral artery: Mixed calcified and noncalcified plaque at the
femoral bifurcation. The femoral and deep femoral branches remain
widely patent. The course of the femoral artery is normal to the
level of the knee.

Popliteal artery: Normal.

Runoff: The right posterior tibial artery is occluded at the level
of the distal tibial metaphysis. The right dorsalis pedis is patent.
The right peroneal artery progressive leak tapers as it approaches
the ankle without an abrupt occlusion.

LEFT LOWER EXTREMITY

Femoral artery: Femoral and deep femoral branches are normal.
Femoral artery is normal to the level of the knee.

Popliteal artery: Normal

Runoff: There is normal three-vessel runoff to the level of the
ankle.

Review of the MIP images confirms the above findings.

NONVASCULAR FINDINGS

Lower chest: 7 mm right middle lobe nodule. No pleural effusion.
There is multifocal pericardial calcification.

Hepatobiliary: The liver is enlarged. Cholelithiasis without acute
inflammation.

Pancreas: Normal contours without ductal dilatation. No
peripancreatic fluid collection.

Spleen: Normal.

Adrenals/Urinary Tract:

--Adrenal glands: Normal.

--Right kidney/ureter: No hydronephrosis or perinephric stranding.
No nephrolithiasis. No obstructing ureteral stones.

--Left kidney/ureter: No hydronephrosis or perinephric stranding. No
nephrolithiasis. No obstructing ureteral stones.

--Urinary bladder: Unremarkable.

Stomach/Bowel:

--Stomach/Duodenum: No hiatal hernia or other gastric abnormality.
Normal duodenal course and caliber.

--Small bowel: No dilatation or inflammation.

--Colon: Rectosigmoid and descending colonic diverticulosis without
diverticulitis. No other focal colonic abnormality.

--Appendix: Surgically absent.

Lymphatic:  No abdominal or pelvic lymphadenopathy.

Reproductive: Mildly enlarged prostate.

Musculoskeletal. No bony spinal canal stenosis or focal osseous
abnormality.

Other: None.
IMPRESSION: 1. Occlusion of the right posterior tibial artery approximately 3 cm
above the ankle mortise.
2. Tapered appearance of the right peroneal artery as it approaches
the ankle without abrupt occlusion. Patent right dorsalis pedis.
3. Normal left three-vessel runoff to the level of the ankle.
4. Right-greater-than-left iliac atherosclerotic disease with severe
stenosis of the right common iliac artery and short segment
occlusion of the right internal iliac artery with distal
reconstitution.
5.  Aortic Atherosclerosis ([id number].G).
6. Cholelithiasis and diverticulosis without acute inflammation.
7. 7 mm right middle lobe pulmonary nodule. Non-contrast chest CT at
6-12 months is recommended. If the nodule is stable at time of
repeat CT, then future CT at 18-24 months (from today's scan) is
considered optional for low-risk patients, but is recommended for
high-risk patients. This recommendation follows the consensus
statement: Guidelines for Management of Incidental Pulmonary Nodules
Detected on CT Images: From the [HOSPITAL] 9379; Radiology
9379; [DATE].

Critical Value/emergent results were called by telephone at the time
of interpretation on 06/21/2017 at [DATE] to Dr. MARCELO ELIAS BARBAGELATA ,
who verbally acknowledged these results.

## 2017-06-21 MED ORDER — OXYCODONE HCL ER 10 MG PO T12A: 10.0000 mg | EXTENDED_RELEASE_TABLET | Freq: Two times a day (BID) | ORAL | Status: DC | PRN

## 2017-06-21 MED ORDER — INSULIN ASPART 100 UNIT/ML ~~LOC~~ SOLN
0.0000 [IU] | Freq: Three times a day (TID) | SUBCUTANEOUS | Status: DC
  Administered 2017-06-22: 2 [IU] via SUBCUTANEOUS
  Administered 2017-06-22: 5 [IU] via SUBCUTANEOUS
  Administered 2017-06-22: 3 [IU] via SUBCUTANEOUS
  Administered 2017-06-23: 2 [IU] via SUBCUTANEOUS
  Administered 2017-06-23: 12:00:00 5 [IU] via SUBCUTANEOUS
  Filled 2017-06-21 (×5): qty 1

## 2017-06-21 MED ORDER — STROKE: EARLY STAGES OF RECOVERY BOOK
Freq: Once | Status: AC
  Administered 2017-06-22: 03:00:00

## 2017-06-21 MED ORDER — ACETAMINOPHEN 325 MG PO TABS: 650.0000 mg | ORAL_TABLET | ORAL | Status: DC | PRN

## 2017-06-21 MED ORDER — DILTIAZEM LOAD VIA INFUSION
15.0000 mg | Freq: Once | INTRAVENOUS | Status: AC
  Administered 2017-06-21: 15 mg via INTRAVENOUS
  Filled 2017-06-21: qty 15

## 2017-06-21 MED ORDER — ACETAMINOPHEN 160 MG/5ML PO SOLN
650.0000 mg | ORAL | Status: DC | PRN
  Filled 2017-06-21: qty 20.3

## 2017-06-21 MED ORDER — LORAZEPAM 2 MG/ML IJ SOLN
1.0000 mg | Freq: Once | INTRAMUSCULAR | Status: AC
  Administered 2017-06-22: 1 mg via INTRAVENOUS
  Filled 2017-06-21 (×2): qty 1

## 2017-06-21 MED ORDER — ACETAMINOPHEN 650 MG RE SUPP: 650.0000 mg | RECTAL | Status: DC | PRN

## 2017-06-21 MED ORDER — HEPARIN BOLUS VIA INFUSION
4500.0000 [IU] | Freq: Once | INTRAVENOUS | Status: AC
  Administered 2017-06-21: 4500 [IU] via INTRAVENOUS
  Filled 2017-06-21: qty 4500

## 2017-06-21 MED ORDER — NICOTINE 21 MG/24HR TD PT24
21.0000 mg | MEDICATED_PATCH | Freq: Every day | TRANSDERMAL | Status: DC
  Administered 2017-06-21 – 2017-06-23 (×3): 21 mg via TRANSDERMAL
  Filled 2017-06-21 (×4): qty 1

## 2017-06-21 MED ORDER — ASPIRIN EC 81 MG PO TBEC
81.0000 mg | DELAYED_RELEASE_TABLET | Freq: Every day | ORAL | Status: DC
  Administered 2017-06-21 – 2017-06-23 (×3): 81 mg via ORAL
  Filled 2017-06-21 (×3): qty 1

## 2017-06-21 MED ORDER — OXYCODONE HCL ER 10 MG PO T12A: 10.0000 mg | EXTENDED_RELEASE_TABLET | Freq: Two times a day (BID) | ORAL | Status: DC

## 2017-06-21 MED ORDER — INSULIN ASPART 100 UNIT/ML ~~LOC~~ SOLN
0.0000 [IU] | Freq: Every day | SUBCUTANEOUS | Status: DC
  Administered 2017-06-21: 0 [IU] via SUBCUTANEOUS

## 2017-06-21 MED ORDER — HYDROCHLOROTHIAZIDE 12.5 MG PO CAPS
12.5000 mg | ORAL_CAPSULE | Freq: Every day | ORAL | Status: DC
  Filled 2017-06-21: qty 1

## 2017-06-21 MED ORDER — SENNOSIDES-DOCUSATE SODIUM 8.6-50 MG PO TABS
1.0000 | ORAL_TABLET | Freq: Every evening | ORAL | Status: DC | PRN
  Administered 2017-06-23: 1 via ORAL
  Filled 2017-06-21: qty 1

## 2017-06-21 MED ORDER — DILTIAZEM HCL 100 MG IV SOLR
5.0000 mg/h | INTRAVENOUS | Status: DC
  Administered 2017-06-21: 5 mg/h via INTRAVENOUS

## 2017-06-21 MED ORDER — SODIUM CHLORIDE 0.9 % IV BOLUS (SEPSIS)
1000.0000 mL | Freq: Once | INTRAVENOUS | Status: AC
  Administered 2017-06-21: 1000 mL via INTRAVENOUS

## 2017-06-21 MED ORDER — LISINOPRIL 5 MG PO TABS: 10.0000 mg | ORAL_TABLET | Freq: Every day | ORAL | Status: DC

## 2017-06-21 MED ORDER — DILTIAZEM HCL 100 MG IV SOLR
5.0000 mg/h | INTRAVENOUS | Status: DC
  Administered 2017-06-21: 5 mg/h via INTRAVENOUS
  Filled 2017-06-21: qty 100

## 2017-06-21 MED ORDER — IOPAMIDOL (ISOVUE-370) INJECTION 76%
125.0000 mL | Freq: Once | INTRAVENOUS | Status: AC | PRN
  Administered 2017-06-21: 125 mL via INTRAVENOUS

## 2017-06-21 MED ORDER — LISINOPRIL-HYDROCHLOROTHIAZIDE 10-12.5 MG PO TABS: 1.0000 | ORAL_TABLET | Freq: Every day | ORAL | Status: DC

## 2017-06-21 MED ORDER — SIMVASTATIN 20 MG PO TABS
40.0000 mg | ORAL_TABLET | Freq: Every day | ORAL | Status: DC
  Administered 2017-06-21 – 2017-06-22 (×2): 40 mg via ORAL
  Filled 2017-06-21 (×2): qty 2

## 2017-06-21 MED ORDER — INFLUENZA VAC SPLIT QUAD 0.5 ML IM SUSY
0.5000 mL | PREFILLED_SYRINGE | INTRAMUSCULAR | Status: AC
  Administered 2017-06-22: 0.5 mL via INTRAMUSCULAR
  Filled 2017-06-21: qty 0.5

## 2017-06-21 MED ORDER — HEPARIN (PORCINE) IN NACL 100-0.45 UNIT/ML-% IJ SOLN
1700.0000 [IU]/h | INTRAMUSCULAR | Status: AC
Start: 2017-06-21 — End: 2017-06-23
  Administered 2017-06-21 (×2): 1250 [IU]/h via INTRAVENOUS
  Administered 2017-06-22: 1450 [IU]/h via INTRAVENOUS
  Filled 2017-06-21 (×3): qty 250

## 2017-06-21 NOTE — ED Provider Notes (Addendum)
[facility] Emergency Department Provider Note  ____________________________________________   First MD Initiated Contact with Patient 06/21/17 1608     (approximate)  I have reviewed the triage vital signs and the nursing notes.   HISTORY  Chief Complaint Tachycardia   HPI [name]. is a 61 y.o. male with a history of diabetes, hypertension and stroke on Plavix was presented to the emergency department today with right lower extremity numbness and weakness that started at 9 PM last night.  He originally presented to urgent care today and was sent to the emergency department for further evaluation.  He was also found to be in atrial fibrillation with a rapid ventricular response.  Patient denies any chest pain or shortness of breath. Says that he has a baseline right-sided hemianopia which is unchanged.   Past Medical History:  Diagnosis Date  . Diabetes ([place])   . History of hernia repair   . Hypertension   . Stroke [facility])     Patient Active Problem List   Diagnosis Date Noted  . Memory loss 09/27/2015  . 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 Diabetic peripheral neuropathy ([place]) 05/16/2015  . History of stroke with residual deficit 04/01/2015  . Chronic headaches 03/15/2015  . Tobacco abuse 01/24/2015  . Hyperlipidemia 01/21/2015  . 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 APPENDECTOMY    . HERNIA REPAIR      Prior to Admission medications   Medication Sig Start Date End Date Taking? Authorizing Provider  clopidogrel (PLAVIX) 75 MG tablet TAKE 1 (ONE) TABLET PO BY MOUTH DAILY 05/20/17  Yes Johnson, Megan P, DO  empagliflozin (JARDIANCE) 10 MG TABS tablet Take 10 mg by mouth daily. Patient taking differently: Take 5 mg daily by mouth.  05/20/17  Yes Johnson, Megan P, DO  lisinopril-hydrochlorothiazide  (PRINZIDE,ZESTORETIC) 10-12.5 MG tablet Take 1 tablet by mouth daily. 05/20/17  Yes Johnson, Megan P, DO  metFORMIN (GLUCOPHAGE) 1000 MG tablet Take 1 tablet (1,000 mg total) by mouth 2 (two) times daily with a meal. 05/20/17  Yes Johnson, Megan P, DO  simvastatin (ZOCOR) 40 MG tablet TAKE 1 TABLET (40 MG TOTAL) BY MOUTH AT BEDTIME. 05/20/17  Yes Johnson, Megan P, DO  ciclopirox (PENLAC) 8 % solution Apply over nail at Bedtime and surrounding skin. Apply daily over previous coat. After seven (7) days, may remove with alcohol and continue Patient not taking: Reported on 06/21/2017 05/20/17   Park Liter P, DO  oxyCODONE (OXYCONTIN) 10 mg 12 hr tablet Take 1 tablet (10 mg total) by mouth every 12 (twelve) hours. 05/20/17   Park Liter P, DO    Allergies Lipitor [atorvastatin] and Gabapentin  Family History  Problem Relation Age of Onset  . Diabetes Father   . Hypertension Father   . Hyperlipidemia Father     Social History Social History   Tobacco Use  . Smoking status: Current Every Day Smoker    Packs/day: 0.50    Years: 30.00    Pack years: 15.00    Types: Cigarettes  . Smokeless tobacco: Never Used  Substance Use Topics  . Alcohol use: Yes    Alcohol/week: 4.2 oz    Types: 7 Cans of beer per week    Comment: occasionally  . Drug use: No    Review of Systems  Constitutional: No fever/chills Eyes: No visual changes from baseline  ENT: No sore  throat. Cardiovascular: Denies chest pain. Respiratory: Denies shortness of breath. Gastrointestinal: No abdominal pain.  No nausea, no vomiting.  No diarrhea.  No constipation. Genitourinary: Negative for dysuria. Musculoskeletal: Negative for back pain. Skin: Negative for rash. Neurological: Negative for headaches,   ____________________________________________   PHYSICAL EXAM:  VITAL SIGNS: ED Triage Vitals  Enc Vitals Group     BP 06/21/17 1615 127/81     Pulse Rate 06/21/17 1615 (!) 178     Resp 06/21/17  1615 17     Temp 06/21/17 1615 98.4 F (36.9 C)     Temp Source 06/21/17 1615 Oral     SpO2 06/21/17 1615 100 %     Weight 06/21/17 1616 188 lb (85.3 kg)     Height --      Head Circumference --      Peak Flow --      Pain Score --      Pain Loc --      Pain Edu? --      Excl. in [place]? --     Constitutional: Alert and oriented. Well appearing and in no acute distress. Eyes: Conjunctivae are normal.  Head: Atraumatic. Nose: No congestion/rhinnorhea. Mouth/Throat: Mucous membranes are moist.  Neck: No stridor.   Cardiovascular: Tachycardic with an irregularly irregular rhythm.  Grossly normal heart sounds.  Palpable posterior tibial left-sided pulse however, the right-sided posterior tibial pulse is reduced when compared to the left. Respiratory: Normal respiratory effort.  No retractions. Lungs CTAB. Gastrointestinal: Soft and nontender. No distention.  Musculoskeletal: No lower extremity tenderness nor edema.  No joint effusions. Neurologic:  Normal speech and language.   Patient with 3 out of 5 strength of the right lower extremity with slightly reduced sensation to light touch.  As noted above, the right posterior tibial pulse is reduced when compared to the left.  Cap refill to the right side of toes is 3-4 seconds.  Skin:  Skin is warm, dry and intact. No rash noted. Psychiatric: Mood and affect are normal. Speech and behavior are normal.  NIH Stroke Scale  Person Administering Scale: Doran Stabler  Administer stroke scale items in the order listed. Record performance in each category after each subscale exam. Do not go back and change scores. Follow directions provided for each exam technique. Scores should reflect what the patient does, not what the clinician thinks the patient can do. The clinician should record answers while administering the exam and work quickly. Except where indicated, the patient should not be coached (i.e., repeated requests to patient to make a  special effort).   1a  Level of consciousness: 0=alert; keenly responsive  1b. LOC questions:  0=Performs both tasks correctly  1c. LOC commands: 0=Performs both tasks correctly  2.  Best Gaze: 0=normal  3.  Visual: 2=Complete hemianopia  4. Facial Palsy: 0=Normal symmetric movement  5a.  Motor left arm: 0=No drift, limb holds 90 (or 45) degrees for full 10 seconds  5b.  Motor right arm: 0=No drift, limb holds 90 (or 45) degrees for full 10 seconds  6a. motor left leg: 0=No drift, limb holds 90 (or 45) degrees for full 10 seconds  6b  Motor right leg:  2=Some effort against gravity, limb cannot get to or maintain (if cured) 90 (or 45) degrees, drifts down to bed, but has some effort against gravity  7. Limb Ataxia: 0=Absent  8.  Sensory: 1=Mild to moderate sensory loss; patient feels pinprick is less sharp or is dull on  the affected side; there is a loss of superficial pain with pinprick but patient is aware He is being touched  9. Best Language:  0=No aphasia, normal  10. Dysarthria: 0=Normal  11. Extinction and Inattention: 0=No abnormality  12. Distal motor function: 0=Normal   Total:   5   ____________________________________________   LABS (all labs ordered are listed, but only abnormal results are displayed)  Labs Reviewed  BASIC METABOLIC PANEL - Abnormal; Notable for the following components:      Result Value   Sodium 134 (*)    Chloride 98 (*)    CO2 20 (*)    Glucose, Bld 171 (*)    Anion gap 16 (*)    All other components within normal limits  CBC - Abnormal; Notable for the following components:   WBC 12.6 (*)    All other components within normal limits  GLUCOSE, CAPILLARY - Abnormal; Notable for the following components:   Glucose-Capillary 188 (*)    All other components within normal limits  APTT - Abnormal; Notable for the following components:   aPTT 66 (*)    All other components within normal limits  DIFFERENTIAL - Abnormal; Notable for the following  components:   Neutro Abs 9.4 (*)    All other components within normal limits  PROTIME-INR  ETHANOL  TROPONIN I  URINE DRUG SCREEN, QUALITATIVE ([facility] ONLY)  URINALYSIS, ROUTINE W REFLEX MICROSCOPIC   ____________________________________________  EKG  ED ECG REPORT I, Doran Stabler, the attending physician, personally viewed and interpreted this ECG.   Date: 06/21/2017  EKG Time: 1604  Rate: 174  Rhythm: atrial fibrillation, rate 174  Axis: Normal  Intervals:none  ST&T Change: No ST segment elevation or depression.  T wave inversion in aVL.  ____________________________________________  RADIOLOGY  Remote left occipital infarction on the head CT without any acute intracranial findings.  CT angiography of the right lower extremity reveals a thrombosed posterior tibial artery. ____________________________________________   PROCEDURES  Procedure(s) performed:   Procedures  Critical Care performed:  CRITICAL CARE Performed by: Doran Stabler   Total critical care time: 35 minutes  Critical care time was exclusive of separately billable procedures and treating other patients.  Critical care was necessary to treat or prevent imminent or life-threatening deterioration.  Critical care was time spent personally by me on the following activities: development of treatment plan with patient and/or surrogate as well as nursing, discussions with consultants, evaluation of patient's response to treatment, examination of patient, obtaining history from patient or surrogate, ordering and performing treatments and interventions, ordering and review of laboratory studies, ordering and review of radiographic studies, pulse oximetry and re-evaluation of patient's condition.  ____________________________________________   INITIAL IMPRESSION / ASSESSMENT AND PLAN / ED COURSE  Pertinent labs & imaging results that were available during my care of the patient were reviewed by me  and considered in my medical decision making (see chart for details).  DDX: CVA, arterial occlusion of the right lower extremity, new onset A. fib with RVR  As part of my medical decision making, I reviewed the following data within the [place] chart reviewed  ----------------------------------------- 7:15 PM on 06/21/2017 -----------------------------------------  Patient at this time continues to be in rapid ventricular response.  With consistent neuro exam is when he first arrived.  I also discussed the case with Dr. Lucky Cowboy of vascular surgery who does not think that any intervention is merited at this time.  Patient will be placed  on heparin.  Will be admitted to the hospital.  I discussed the admitting diagnosis as well as the treatment plan with the patient and his wife is at the bedside.  They are understanding and willing to comply.  Patient symptoms started 9 PM yesterday.  No new symptoms of large vessel occlusion.  Patient not a TPA candidate at this time.  Stroke alert was not called.       ____________________________________________   FINAL CLINICAL IMPRESSION(S) / ED DIAGNOSES  Final diagnoses:  Tachycardia  Atrial fibrillation with rapid ventricular response.  Right lower extremity weakness.  Right posterior tibial artery occlusion.    NEW MEDICATIONS STARTED DURING THIS VISIT:  This SmartLink is deprecated. Use AVSMEDLIST instead to display the medication list for a patient.   Note:  This document was prepared using Dragon voice recognition software and may include unintentional dictation errors.     Orbie Pyo, MD 06/21/17 1919    Orbie Pyo, MD 06/21/17 [phone]

## 2017-06-21 NOTE — Progress Notes (Signed)
ANTICOAGULATION CONSULT NOTE - Initial Consult  Pharmacy Consult for heparin drip Indication: atrial fibrillation  Allergies  Allergen Reactions  . Lipitor [Atorvastatin] Rash  . Gabapentin Other (See Comments)    Other reaction(s): Dizziness    Patient Measurements: Weight: 188 lb (85.3 kg) Heparin Dosing Weight: 91.5 kg  Vital Signs: Temp: 98.4 F (36.9 C) (11/19 1615) Temp Source: Oral (11/19 1615) BP: 116/74 (11/19 1815) Pulse Rate: 46 (11/19 1815)  Labs: Recent Labs    06/21/17 1616 06/21/17 1822  HGB 16.9  --   HCT 49.0  --   PLT 363  --   APTT  --  66*  LABPROT 13.6  --   INR 1.05  --   CREATININE 0.72  --   TROPONINI  --  <0.03    Estimated Creatinine Clearance: 102 mL/min (by C-G formula based on SCr of 0.72 mg/dL).   Medical History: Past Medical History:  Diagnosis Date  . Diabetes ([place])   . History of hernia repair   . Hypertension   . Stroke [facility])     Medications:   Patient was not on any anticoagulants at home.   Assessment: 61yo male admitted with afib. Pharmacy has been consulted to dose and monitor heparin drip.  Goal of Therapy:  Heparin level 0.3-0.7 units/ml Monitor platelets by anticoagulation protocol: Yes   Plan:  Give 4500 units bolus x 1 Start heparin infusion at 1250 units/hr Check anti-Xa level in 6 hours and daily while on heparin Continue to monitor H&H and platelets  Heparin level will be drawn 6 hours after the drip is started. Pharmacy will continue to follow and adjust as needed.   Lendon Ka, PharmD Pharmacy Resident 06/21/2017,7:12 PM

## 2017-06-21 NOTE — H&P (Signed)
[facility] NAME: [name]    MR#:  [id number]  DATE OF BIRTH:  Apr 17, 1956  DATE OF ADMISSION:  06/21/2017  PRIMARY CARE PHYSICIAN: [name], DO   REQUESTING/REFERRING PHYSICIAN:   CHIEF COMPLAINT:   Chief Complaint  Patient presents with  . Tachycardia    HISTORY OF PRESENT ILLNESS: [name]  is a 61 y.o. male with a known history per below, history of CVA with memory deficits/chronic headache as a result, presenting with right arm/right leg weakness since 9 PM on yesterday noted by his wife, patient was seen in urgent care today and was referred to the ER for further evaluation, in the emergency room patient was found to have A. fib with RVR with heart rate in the 170s which is new, patient was subsequently started on Cardizem/heparin drips, CT head noted for old left occipital infarct, chest x-ray was negative, CT angio noted below for multiple abnormalities-ED attending did discuss case with vascular surgery-no intervention necessary at this time, with sodium 134, chloride 98, white count 12,000, patient evaluated in the emergency room, no apparent distress, wife at the bedside, patient is a poor historian due to memory deficits from stroke 2.5 years ago, patient is now being admitted for acute CVA with right upper/lower hemiparesis and new onset A. fib with RVR.  PAST MEDICAL HISTORY:   Past Medical History:  Diagnosis Date  . Diabetes ([place])   . History of hernia repair   . Hypertension   . Stroke [facility])     PAST SURGICAL HISTORY:  Past Surgical History:  Procedure Laterality Date  . APPENDECTOMY    . HERNIA REPAIR      SOCIAL HISTORY:  Social History   Tobacco Use  . Smoking status: Current Every Day Smoker    Packs/day: 0.50    Years: 30.00    Pack years: 15.00    Types: Cigarettes  . Smokeless tobacco: Never Used  Substance Use Topics  . Alcohol use: Yes    Alcohol/week: 4.2 oz    Types: 7  Cans of beer per week    Comment: occasionally    FAMILY HISTORY:  Family History  Problem Relation Age of Onset  . Diabetes Father   . Hypertension Father   . Hyperlipidemia Father     DRUG ALLERGIES:  Allergies  Allergen Reactions  . Lipitor [Atorvastatin] Rash  . Gabapentin Other (See Comments)    Other reaction(s): Dizziness    REVIEW OF SYSTEMS: Poor historian given remote stroke with memory deficits  CONSTITUTIONAL: No fever, fatigue or weakness.  EYES: No blurred or double vision.  EARS, NOSE, AND THROAT: No tinnitus or ear pain.  RESPIRATORY: No cough, shortness of breath, wheezing or hemoptysis.  CARDIOVASCULAR: No chest pain, orthopnea, edema.  GASTROINTESTINAL: No nausea, vomiting, diarrhea or abdominal pain.  GENITOURINARY: No dysuria, hematuria.  ENDOCRINE: No polyuria, nocturia,  HEMATOLOGY: No anemia, easy bruising or bleeding SKIN: No rash or lesion. MUSCULOSKELETAL: No joint pain or arthritis.   NEUROLOGIC: Right arm/leg weakness PSYCHIATRY: No anxiety or depression.   MEDICATIONS AT HOME:  Prior to Admission medications   Medication Sig Start Date End Date Taking? Authorizing Provider  clopidogrel (PLAVIX) 75 MG tablet TAKE 1 (ONE) TABLET PO BY MOUTH DAILY 05/20/17  Yes Johnson, Megan P, DO  empagliflozin (JARDIANCE) 10 MG TABS tablet Take 10 mg by mouth daily. Patient taking differently: Take 5 mg daily by mouth.  05/20/17  Yes Wynetta Emery,  Megan P, DO  lisinopril-hydrochlorothiazide (PRINZIDE,ZESTORETIC) 10-12.5 MG tablet Take 1 tablet by mouth daily. 05/20/17  Yes Johnson, Megan P, DO  metFORMIN (GLUCOPHAGE) 1000 MG tablet Take 1 tablet (1,000 mg total) by mouth 2 (two) times daily with a meal. 05/20/17  Yes Johnson, Megan P, DO  simvastatin (ZOCOR) 40 MG tablet TAKE 1 TABLET (40 MG TOTAL) BY MOUTH AT BEDTIME. 05/20/17  Yes Johnson, Megan P, DO  ciclopirox (PENLAC) 8 % solution Apply over nail at Bedtime and surrounding skin. Apply daily over previous  coat. After seven (7) days, may remove with alcohol and continue Patient not taking: Reported on 06/21/2017 05/20/17   Park Liter P, DO  oxyCODONE (OXYCONTIN) 10 mg 12 hr tablet Take 1 tablet (10 mg total) by mouth every 12 (twelve) hours. 05/20/17   Johnson, Megan P, DO      PHYSICAL EXAMINATION:   VITAL SIGNS: Blood pressure 116/74, pulse (!) 46, temperature 98.4 F (36.9 C), resp. rate 17, weight 85.3 kg (188 lb), SpO2 98 %.  GENERAL:  61 y.o.-year-old patient lying in the bed with no acute distress.  Nontoxic-appearing, obese EYES: Pupils equal, round, reactive to light and accommodation. No scleral icterus. Extraocular muscles intact.  HEENT: Head atraumatic, normocephalic. Oropharynx and nasopharynx clear.  NECK:  Supple, no jugular venous distention. No thyroid enlargement, no tenderness.  LUNGS: Normal breath sounds bilaterally, no wheezing, rales,rhonchi or crepitation. No use of accessory muscles of respiration.  CARDIOVASCULAR: Regular rate and rhythm, No murmurs, rubs, or gallops.  ABDOMEN: Soft, nontender, nondistended. Bowel sounds present. No organomegaly or mass.  EXTREMITIES: No pedal edema, cyanosis, or clubbing.  NEUROLOGIC: PERRL, right upper/lower hemiparesis  4/6 in muscular strength. Sensation intact. Gait not checked.  PSYCHIATRIC: The patient is alert and oriented x 2, confused, disoriented SKIN: No obvious rash, lesion, or ulcer.   LABORATORY PANEL:   CBC Recent Labs  Lab 06/21/17 1616  WBC 12.6*  HGB 16.9  HCT 49.0  PLT 363  MCV 88.4  MCH 30.4  MCHC 34.4  RDW 13.4  LYMPHSABS 2.1  MONOABS 1.0  EOSABS 0.4  BASOSABS 0.1   ------------------------------------------------------------------------------------------------------------------  Chemistries  Recent Labs  Lab 06/21/17 1616  NA 134*  K 3.7  CL 98*  CO2 20*  GLUCOSE 171*  BUN 11  CREATININE 0.72  CALCIUM 10.2    ------------------------------------------------------------------------------------------------------------------ estimated creatinine clearance is 102 mL/min (by C-G formula based on SCr of 0.72 mg/dL). ------------------------------------------------------------------------------------------------------------------ No results for input(s): TSH, T4TOTAL, T3FREE, THYROIDAB in the last 72 hours.  Invalid input(s): FREET3   Coagulation profile Recent Labs  Lab 06/21/17 1616  INR 1.05   ------------------------------------------------------------------------------------------------------------------- No results for input(s): DDIMER in the last 72 hours. -------------------------------------------------------------------------------------------------------------------  Cardiac Enzymes Recent Labs  Lab 06/21/17 1822  TROPONINI <0.03   ------------------------------------------------------------------------------------------------------------------ Invalid input(s): POCBNP  ---------------------------------------------------------------------------------------------------------------  Urinalysis    Component Value Date/Time   COLORURINE YELLOW (A) 04/29/2015 1925   APPEARANCEUR Clear 05/20/2017 1517   LABSPEC 1.010 04/29/2015 1925   PHURINE 6.0 04/29/2015 1925   GLUCOSEU 3+ (A) 05/20/2017 1517   HGBUR NEGATIVE 04/29/2015 1925   BILIRUBINUR Negative 05/20/2017 1517   KETONESUR NEGATIVE 04/29/2015 1925   PROTEINUR Negative 05/20/2017 1517   PROTEINUR NEGATIVE 04/29/2015 1925   NITRITE Negative 05/20/2017 1517   NITRITE NEGATIVE 04/29/2015 1925   LEUKOCYTESUR Negative 05/20/2017 1517     RADIOLOGY: Ct Head Wo Contrast  Result Date: 06/21/2017 CLINICAL DATA:  Pt reports Right foot going numb last night, unknown as to what time.  Pt also has hx of diabetes. Pt thinks numbness started around 9pm. Pt states he cannot lift his R leg at all at this time. Decreased right lower  ext pulse a.*comment was truncated*^168mL ISOVUE-370 IOPAMIDOL (ISOVUE-370) INJECTION 76% EXAM: CT HEAD WITHOUT CONTRAST TECHNIQUE: Contiguous axial images were obtained from the base of the skull through the vertex without intravenous contrast. COMPARISON:  04/29/2015 FINDINGS: Brain: No acute intracranial hemorrhage. No focal mass lesion. No CT evidence of acute infarction. No midline shift or mass effect. No hydrocephalus. Basilar cisterns are patent. Remote LEFT occipital infarction with encephalomalacia. Vascular: No hyperdense vessel or unexpected calcification. Skull: Normal. Negative for fracture or focal lesion. Sinuses/Orbits: Paranasal sinuses and mastoid air cells are clear. Orbits are clear. Other: None. IMPRESSION: 1. No acute intracranial findings. 2. Remote LEFT occipital infarction. Electronically Signed   By: Suzy Bouchard M.D.   On: 06/21/2017 18:11   Ct Angio Ao+bifem W & Or Wo Contrast  Result Date: 06/21/2017 CLINICAL DATA:  Right foot numbness and cold to touch EXAM: CT ANGIOGRAPHY AOBIFEM WITHOUT AND WITH CONTRAST TECHNIQUE: Using angiographic technique, postcontrast CT images of the abdomen and pelvis and lower extremities were obtained, from the lung bases through both feet. CONTRAST:  167mL ISOVUE-370 IOPAMIDOL (ISOVUE-370) INJECTION 76% COMPARISON:  None. FINDINGS: VASCULAR FINDINGS Aorta: Normal caliber distal thoracic and abdominal aorta. There is moderate calcified and noncalcified plaque in the infrarenal aorta without hemodynamically significant stenosis. Celiac axis:  Normal. Superior mesenteric artery: Normal. Renal arteries: Single renal arteries bilaterally. No stenosis or other abnormality. Inferior mesenteric artery:  Normal. Inflow: There is severe, predominantly noncalcified plaque within the right common iliac artery that causes severe stenosis just proximal to the right iliac bifurcation. The right external iliac artery is widely patent. There is short segment  occlusion of the right internal iliac artery which reconstitutes distally. There is moderate atherosclerotic noncalcified plaque in the left common iliac artery without hemodynamically significant stenosis. Mild atherosclerosis of the left internal and external iliac arteries without hemodynamically significant stenosis. RIGHT LOWER EXTREMITY Femoral artery: Mixed calcified and noncalcified plaque at the femoral bifurcation. The femoral and deep femoral branches remain widely patent. The course of the femoral artery is normal to the level of the knee. Popliteal artery: Normal. Runoff: The right posterior tibial artery is occluded at the level of the distal tibial metaphysis. The right dorsalis pedis is patent. The right peroneal artery progressive leak tapers as it approaches the ankle without an abrupt occlusion. LEFT LOWER EXTREMITY Femoral artery: Femoral and deep femoral branches are normal. Femoral artery is normal to the level of the knee. Popliteal artery: Normal Runoff: There is normal three-vessel runoff to the level of the ankle. Review of the MIP images confirms the above findings. NONVASCULAR FINDINGS Lower chest: 7 mm right middle lobe nodule. No pleural effusion. There is multifocal pericardial calcification. Hepatobiliary: The liver is enlarged. Cholelithiasis without acute inflammation. Pancreas: Normal contours without ductal dilatation. No peripancreatic fluid collection. Spleen: Normal. Adrenals/Urinary Tract: --Adrenal glands: Normal. --Right kidney/ureter: No hydronephrosis or perinephric stranding. No nephrolithiasis. No obstructing ureteral stones. --Left kidney/ureter: No hydronephrosis or perinephric stranding. No nephrolithiasis. No obstructing ureteral stones. --Urinary bladder: Unremarkable. Stomach/Bowel: --Stomach/Duodenum: No hiatal hernia or other gastric abnormality. Normal duodenal course and caliber. --Small bowel: No dilatation or inflammation. --Colon: Rectosigmoid and descending  colonic diverticulosis without diverticulitis. No other focal colonic abnormality. --Appendix: Surgically absent. Lymphatic:  No abdominal or pelvic lymphadenopathy. Reproductive: Mildly enlarged prostate. Musculoskeletal. No bony spinal canal stenosis or focal  osseous abnormality. Other: None. IMPRESSION: 1. 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 2. Tapered appearance of the right peroneal artery as it approaches the ankle without abrupt occlusion. Patent right dorsalis pedis. 3. Normal left three-vessel runoff to the level of the ankle. 4. 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 5.  Aortic Atherosclerosis (ICD10-I70.0). 6. Cholelithiasis and diverticulosis without acute inflammation. 7. 7 mm right middle lobe pulmonary nodule. Non-contrast chest CT at 6-12 months is recommended. If the nodule is stable at time of repeat CT, then future CT at 18-24 months (from today's scan) is considered optional for low-risk patients, but is recommended for high-risk patients. This recommendation follows the consensus statement: Guidelines for Management of Incidental Pulmonary Nodules Detected on CT Images: From the Fleischner Society 2017; Radiology 2017; 284:228-243. Critical Value/emergent results were called by telephone at the time of interpretation on 06/21/2017 at 6:48 pm to Dr. Larae Grooms , who verbally acknowledged these results. Electronically Signed   By: Ulyses Jarred M.D.   On: 06/21/2017 18:54   Dg Chest Port 1 View  Result Date: 06/21/2017 CLINICAL DATA:  Tachycardia.  Sweating. EXAM: PORTABLE CHEST 1 VIEW COMPARISON:  None. FINDINGS: The heart size and mediastinal contours are within normal limits. Both lungs are clear. The visualized skeletal structures are unremarkable. IMPRESSION: Normal exam.  Electronically Signed   By: Lorriane Shire M.D.   On: 06/21/2017 17:10    EKG: Orders placed or performed during the hospital encounter of 06/21/17  . EKG 12-Lead  . EKG 12-Lead  . ED EKG within 10 minutes  . ED EKG within 10 minutes  . ED EKG  . ED EKG  . EKG 12-lead    IMPRESSION AND PLAN: 1 acute CVA with right sided hemiparesis Most likely secondary to new onset A. fib with RVR Occurred on statin therapy/Plavix Admit on CVA protocol, neuro checks per routine, neurology consult with expert opinion, aspirin daily, heparin drip, check echocardiogram, carotid Dopplers, MRA/MRI of the brain, PT/OT/speech therapy to evaluate/treat, case management to assist with disposition planning this patient may require rehab placement, aspiration/fall precautions  2.  Acute A. 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 more so than the left, right internal iliac artery occlusion Incidentally found abnormalities  Consult vascular surgery for expert opinion, no acute intervention at this time  5 acute 7 mm pulmonary nodule Will need CT scan in 6 months for reevaluation Will need to follow-up with pulmonology status post discharge  6 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 Management  plans discussed with the patient, family and they are in agreement.  CODE STATUS: Code Status History    This patient does not have a recorded code status. Please follow your organizational policy for patients in this situation.       TOTAL TIME TAKING CARE OF THIS PATIENT: 45 minutes.    [name] Shavonne Ambroise M.D on 06/21/2017   Between 7am to 6pm - Pager - [phone]  After 6pm go to [email or web address] - password [email or web address] [facility]  Office  [phone]  CC: Primary care physician; [name], DO   Note: This dictation was prepared with Dragon dictation along with smaller phrase technology. Any transcriptional errors that result from this process are unintentional.

## 2017-06-21 NOTE — Progress Notes (Signed)
BP 134/82 (BP Location: Left Arm, Patient Position: Sitting, Cuff Size: Normal)   Pulse (!) 178   SpO2 96%    Subjective:    Patient ID: Glenn Gerold., male    DOB: 1955-11-24, 61 y.o.   MRN: [id number]  HPI: Glenn Kaluzny. is a 61 y.o. male  Chief Complaint  Patient presents with  . Extremity Weakness   [name] presents today with his wife. He is not feeling like himself. He notes that his back has been hurting and yesterday his leg started feeling numb, so he called to be seen today. His wife notes that he has been more tired that usual. He is usually very independent and doesn't like asking for help with things since his stroke 2.5 years ago, but today, he needed to ask for help and she notes that that is not like him. She notes that he keeps lifting his R arm with his L arm, and she had asked him several times if his arm was bothering him, but he denied this. She thinks it started yesterday. [name] has had issues with his short term memory since his stroke 2.5 years ago. He notes that his leg feels heavy and his wife notes that he is having a tough time moving it and getting around. He has been having a bit of pressure in his chest and just hasn't been feeling like himself.   He has no history of known A. Fib. He was evaluated by Dr. Clayborn Bigness in 2016 following his stroke with a stress test, Echo and holter monitor. He has not followed up with Dr. Clayborn Bigness since Aug 2017 as he missed his appointment in February.   Relevant past medical, surgical, family and social history reviewed and updated as indicated. Interim medical history since our last visit reviewed. Allergies and medications reviewed and updated.  Review of Systems  Constitutional: Positive for diaphoresis. Negative for activity change, appetite change, chills, fatigue, fever and unexpected weight change.  Respiratory: Positive for chest tightness. Negative for apnea, cough, choking, shortness of breath, wheezing and  stridor.   Cardiovascular: Positive for palpitations. Negative for chest pain and leg swelling.  Neurological: Positive for dizziness, weakness and numbness. Negative for tremors, seizures, syncope, facial asymmetry, speech difficulty, light-headedness and headaches.  Psychiatric/Behavioral: Negative.     Per HPI unless specifically indicated above     Objective:    BP 134/82 (BP Location: Left Arm, Patient Position: Sitting, Cuff Size: Normal)   Pulse (!) 178   SpO2 96%   Wt Readings from Last 3 Encounters:  06/21/17 188 lb (85.3 kg)  05/20/17 188 lb (85.3 kg)  02/15/17 189 lb 1 oz (85.8 kg)    Physical Exam  Constitutional: He is oriented to person, place, and time. He appears well-developed and well-nourished. No distress.  HENT:  Head: Normocephalic and atraumatic.  Right Ear: Hearing normal.  Left Ear: Hearing normal.  Nose: Nose normal.  Eyes: Conjunctivae and lids are normal. Right eye exhibits no discharge. Left eye exhibits no discharge. No scleral icterus.  Cardiovascular: Intact distal pulses. An irregularly irregular rhythm present. Tachycardia present. Exam reveals no gallop and no friction rub.  No murmur heard. Pulmonary/Chest: Effort normal and breath sounds normal. No respiratory distress. He has no wheezes. He has no rales. He exhibits no tenderness.  Musculoskeletal: Normal range of motion.  Neurological: He is alert and oriented to person, place, and time.  Weakness to R arm and leg- new- this is not  part of his previous stroke  Skin: Skin is warm and intact. No rash noted. He is diaphoretic. No erythema. No pallor.  Psychiatric: He has a normal mood and affect. His speech is normal and behavior is normal. Judgment and thought content normal. Cognition and memory are normal.  Nursing note and vitals reviewed.   Results for orders placed or performed in visit on 05/20/17  Microscopic Examination  Result Value Ref Range   WBC, UA 0-5 0 - 5 /hpf   RBC, UA  0-2 0 - 2 /hpf   Epithelial Cells (non renal) 0-10 0 - 10 /hpf   Bacteria, UA None seen None seen/Few  Bayer DCA Hb A1c Waived  Result Value Ref Range   Bayer DCA Hb A1c Waived 7.2 (H) <7.0 %  CBC with Differential/Platelet  Result Value Ref Range   WBC 9.6 3.4 - 10.8 x10E3/uL   RBC 5.31 4.14 - 5.80 x10E6/uL   Hemoglobin 16.0 13.0 - 17.7 g/dL   Hematocrit 46.2 37.5 - 51.0 %   MCV 87 79 - 97 fL   MCH 30.1 26.6 - 33.0 pg   MCHC 34.6 31.5 - 35.7 g/dL   RDW 13.5 12.3 - 15.4 %   Platelets 297 150 - 379 x10E3/uL   Neutrophils 66 Not Estab. %   Lymphs 22 Not Estab. %   Monocytes 6 Not Estab. %   Eos 6 Not Estab. %   Basos 0 Not Estab. %   Neutrophils Absolute 6.2 1.4 - 7.0 x10E3/uL   Lymphocytes Absolute 2.1 0.7 - 3.1 x10E3/uL   Monocytes Absolute 0.6 0.1 - 0.9 x10E3/uL   EOS (ABSOLUTE) 0.6 (H) 0.0 - 0.4 x10E3/uL   Basophils Absolute 0.0 0.0 - 0.2 x10E3/uL   Immature Granulocytes 0 Not Estab. %   Immature Grans (Abs) 0.0 0.0 - 0.1 x10E3/uL  Comprehensive metabolic panel  Result Value Ref Range   Glucose 139 (H) 65 - 99 mg/dL   BUN 9 8 - 27 mg/dL   Creatinine, Ser 0.75 (L) 0.76 - 1.27 mg/dL   GFR calc non Af Amer 98 >59 mL/min/1.73   GFR calc Af Amer 114 >59 mL/min/1.73   BUN/Creatinine Ratio 12 10 - 24   Sodium 138 134 - 144 mmol/L   Potassium 4.7 3.5 - 5.2 mmol/L   Chloride 96 96 - 106 mmol/L   CO2 26 20 - 29 mmol/L   Calcium 10.6 (H) 8.6 - 10.2 mg/dL   Total Protein 7.5 6.0 - 8.5 g/dL   Albumin 4.9 (H) 3.6 - 4.8 g/dL   Globulin, Total 2.6 1.5 - 4.5 g/dL   Albumin/Globulin Ratio 1.9 1.2 - 2.2   Bilirubin Total 0.5 0.0 - 1.2 mg/dL   Alkaline Phosphatase 90 39 - 117 IU/L   AST 29 0 - 40 IU/L   ALT 36 0 - 44 IU/L  Lipid Panel w/o Chol/HDL Ratio  Result Value Ref Range   Cholesterol, Total 174 100 - 199 mg/dL   Triglycerides 185 (H) 0 - 149 mg/dL   HDL 52 >39 mg/dL   VLDL Cholesterol Cal 37 5 - 40 mg/dL   LDL Calculated 85 0 - 99 mg/dL  Microalbumin, Urine Waived    Result Value Ref Range   Microalb, Ur Waived 10 0 - 19 mg/L   Creatinine, Urine Waived 50 10 - 300 mg/dL   Microalb/Creat Ratio 30-300 (H) <30 mg/g  PSA  Result Value Ref Range   Prostate Specific Ag, Serum 0.5 0.0 - 4.0 ng/mL  TSH  Result Value Ref Range   TSH 1.960 0.450 - 4.500 uIU/mL  UA/M w/rflx Culture, Routine  Result Value Ref Range   Specific Gravity, UA 1.010 1.005 - 1.030   pH, UA 6.0 5.0 - 7.5   Color, UA Yellow Yellow   Appearance Ur Clear Clear   Leukocytes, UA Negative Negative   Protein, UA Negative Negative/Trace   Glucose, UA 3+ (A) Negative   Ketones, UA Negative Negative   RBC, UA Negative Negative   Bilirubin, UA Negative Negative   Urobilinogen, Ur 0.2 0.2 - 1.0 mg/dL   Nitrite, UA Negative Negative   Microscopic Examination See below:    EKG shows new onset afib with a rate of 158bpm with a wandering baseline and some elevation of ST in V1, 2 and 3.     Assessment & Plan:   Problem List Items Addressed This Visit    None    Visit Diagnoses    New onset atrial fibrillation ([place])    -  Primary   History of stroke about 18 months ago, but no known history of a fib. Will send to ER for evaluation. Call to triage made.    Acute right hemiparesis ([place])       Started yesterday. Concern for possible stroke with new onset a. fib. Will send to ER for evaluation. Call to triage nurse made.   Chest pain, unspecified type       Not feeling like himself, sweaty and some pressure- EKG shows a fib, which he does not have a history of. Will send to ER for evaluation.    Relevant Orders   EKG 12-Lead (Completed)       Follow up plan: Return After ER evaluation. Marland Kitchen

## 2017-06-21 NOTE — ED Notes (Signed)
Heparin drip started, Delsa Sale, RN witnessed.

## 2017-06-21 NOTE — ED Notes (Signed)
Pt repeating self frequently and appears anxious at this time.  Per pt's wife it is not uncommon for him to be forgetful when he is stressed out.  Pt repositioned in bed at this time.

## 2017-06-21 NOTE — ED Triage Notes (Signed)
Pt reports R foot going numb last night, unknown as to what time.  Pt also has hx of diabetes.  Pt thinks numbness started around 9pm.  Pt states he cannot lift his R leg at all at this time.

## 2017-06-21 NOTE — ED Notes (Signed)
Heparin rate/dose verified

## 2017-06-21 NOTE — Progress Notes (Signed)
eLink Physician-Brief Progress Note Patient Name: [name]. DOB: 1955/11/20 MRN: [id number]   Date of Service  06/21/2017  HPI/Events of Note  61 yo male with PMH of CVA. Presents with onset of R arm/leg weakness since 9 PM yesterday. Therefore, he is out of the window for tPA. Neurology has been consulted. PCCM is consulted to assume care in the ICU. VSS.   eICU Interventions  Now new orders.      Intervention Category Evaluation Type: New Patient Evaluation  Lysle Dingwall 06/21/2017, 10:05 PM

## 2017-06-21 NOTE — Consult Note (Signed)
Name: [name]. MRN: [id number] DOB: April 06, 1956    ADMISSION DATE:  06/21/2017 CONSULTATION DATE: 06/21/2017  REFERRING MD : Dr. Jerelyn Charles   CHIEF COMPLAINT: Right Lower Extremity Weakness   BRIEF PATIENT DESCRIPTION:  61 yo male admitted 11/19 with right upper/lower hemiparesis concerning for possible CVA, new onset afibb with rvr requiring cardizem gtt, and right lower extremity DVT on heparin gtt   SIGNIFICANT EVENTS  11/19-Pt admitted to stepdown unit   STUDIES:  CT Angio AO+Bifem 11/19>>Occlusion of the right posterior tibial artery approximately 3 cm above the ankle mortise. Tapered appearance of the right peroneal artery as it approaches the ankle without abrupt occlusion. Patent right dorsalis pedis. Normal left three-vessel runoff to the level of the ankle. 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 Aortic Atherosclerosis (ICD10-I70.0). Cholelithiasis and diverticulosis without acute inflammation. 7 mm right middle lobe pulmonary nodule. Non-contrast chest CT at 6-12 months is recommended. If the nodule is stable at time of repeat CT, then future CT at 18-24 months (from today's scan) is considered optional for low-risk patients, but is recommended for high-risk patients. This recommendation follows the consensus statement: Guidelines for Management of Incidental Pulmonary Nodules CT Head 11/19>>No acute intracranial findings. Remote LEFT occipital infarction  HISTORY OF PRESENT ILLNESS:   This is a 61 yo male with a PMH of Stroke with memory deficits, HTN, Hernia Repair, and Diabetes Mellitus.  He presented to [facility] ER 11/19 from his PCP office with right upper/lower extremity numbness onset of symptoms the night 11/18 at 09:00 pm concerning for possible CVA.  In the ER he was found to be in atrial fibrillation with rvr hr  170's, which is new for him he was subsequently started on  Cardizem and Heparin gtts.  CT head revealed no acute intracranial findings, however remote left occipital infarction present. CT Angio AO+Bifem revealed occlusion of the right posterior tibial artery.  He was subsequently admitted to the [facility] Unit by hospitalist team for further workup and treatment PCCM consulted.  PAST MEDICAL HISTORY :   has a past medical history of Diabetes ([place]), History of hernia repair, Hypertension, and Stroke ([place]).  has a past surgical history that includes Appendectomy and Hernia repair. Prior to Admission medications   Medication Sig Start Date End Date Taking? Authorizing Provider  clopidogrel (PLAVIX) 75 MG tablet TAKE 1 (ONE) TABLET PO BY MOUTH DAILY 05/20/17  Yes Johnson, Megan P, DO  empagliflozin (JARDIANCE) 10 MG TABS tablet Take 10 mg by mouth daily. Patient taking differently: Take 5 mg daily by mouth.  05/20/17  Yes Johnson, Megan P, DO  lisinopril-hydrochlorothiazide (PRINZIDE,ZESTORETIC) 10-12.5 MG tablet Take 1 tablet by mouth daily. 05/20/17  Yes Johnson, Megan P, DO  metFORMIN (GLUCOPHAGE) 1000 MG tablet Take 1 tablet (1,000 mg total) by mouth 2 (two) times daily with a meal. 05/20/17  Yes Johnson, Megan P, DO  simvastatin (ZOCOR) 40 MG tablet TAKE 1 TABLET (40 MG TOTAL) BY MOUTH AT BEDTIME. 05/20/17  Yes Johnson, Megan P, DO  ciclopirox (PENLAC) 8 % solution Apply over nail at Bedtime and surrounding skin. Apply daily over previous coat. After seven (7) days, may remove with alcohol and continue Patient not taking: Reported on 06/21/2017 05/20/17   Park Liter P, DO  oxyCODONE (OXYCONTIN) 10 mg 12 hr tablet Take 1 tablet (10 mg total) by mouth every 12 (twelve) hours. 05/20/17  Johnson, Megan P, DO   Allergies  Allergen Reactions  . Lipitor [Atorvastatin] Rash  . Gabapentin Other (See Comments)    Other reaction(s): Dizziness    FAMILY HISTORY:  family history includes Diabetes  in his father; Hyperlipidemia in his father; Hypertension in his father. SOCIAL HISTORY:  reports that he has been smoking cigarettes.  He has a 15.00 pack-year smoking history. he has never used smokeless tobacco. He reports that he drinks about 4.2 oz of alcohol per week. He reports that he does not use drugs.  REVIEW OF SYSTEMS: Positives in BOLD   Constitutional: Negative for fever, chills, weight loss, malaise/fatigue and diaphoresis.  HENT: Negative for hearing loss, ear pain, nosebleeds, congestion, sore throat, neck pain, tinnitus and ear discharge.   Eyes: Negative for blurred vision, double vision, photophobia, pain, discharge and redness.  Respiratory: Negative for cough, hemoptysis, sputum production, shortness of breath, wheezing and stridor.   Cardiovascular: Negative for chest pain, palpitations, orthopnea, claudication, leg swelling and PND.  Gastrointestinal: Negative for heartburn, nausea, vomiting, abdominal pain, diarrhea, constipation, blood in stool and melena.  Genitourinary: Negative for dysuria, urgency, frequency, hematuria and flank pain.  Musculoskeletal: Negative for myalgias, back pain, joint pain and falls.  Skin: Negative for itching and rash.  Neurological: right lower hemiparesis, dizziness, tingling, tremors, sensory change, speech change, focal weakness, seizures, loss of consciousness, weakness and headaches.  Endo/Heme/Allergies: Negative for environmental allergies and polydipsia. Does not bruise/bleed easily.  SUBJECTIVE:  Pt refusing MRI he states he DOES NOT want the test I explained to him his symptoms are highly concerning for a possible stroke he states he understands this but is still refusing the MRI.  VITAL SIGNS: Temp:  [98.4 F (36.9 C)] 98.4 F (36.9 C) (11/19 1944) Pulse Rate:  [45-178] 89 (11/19 2020) Resp:  [13-24] 15 (11/19 2020) BP: (95-134)/(70-93) 122/87 (11/19 2020) SpO2:  [93 %-100 %] 96 % (11/19 2020) Weight:  [85.3 kg (188 lb)]  85.3 kg (188 lb) (11/19 1616)  PHYSICAL EXAMINATION: General: well developed, well nourished male  Neuro: alert and oriented, follows commands, PERRLA, right lower extremity hemiparesis  HEENT: supple, no JVD  Cardiovascular: nsr, rrr, no M/R/G Lungs: clear throughout, even, non labored  Abdomen: +BS x4, soft, non tender, non distended  Musculoskeletal: right lower extremity hemiparesis, right hand grip moderate, left hand grip strong  Skin: intact no rashes or lesions present   Recent Labs  Lab 06/21/17 1616  NA 134*  K 3.7  CL 98*  CO2 20*  BUN 11  CREATININE 0.72  GLUCOSE 171*   Recent Labs  Lab 06/21/17 1616  HGB 16.9  HCT 49.0  WBC 12.6*  PLT 363   Ct Head Wo Contrast  Result Date: 06/21/2017 CLINICAL DATA:  Pt reports Right foot going numb last night, unknown as to what time. Pt also has hx of diabetes. Pt thinks numbness started around 9pm. Pt states he cannot lift his R leg at all at this time. Decreased right lower ext pulse a.*comment was truncated*^128mL ISOVUE-370 IOPAMIDOL (ISOVUE-370) INJECTION 76% EXAM: CT HEAD WITHOUT CONTRAST TECHNIQUE: Contiguous axial images were obtained from the base of the skull through the vertex without intravenous contrast. COMPARISON:  04/29/2015 FINDINGS: Brain: No acute intracranial hemorrhage. No focal mass lesion. No CT evidence of acute infarction. No midline shift or mass effect. No hydrocephalus. Basilar cisterns are patent. Remote LEFT occipital infarction with encephalomalacia. Vascular: No hyperdense vessel or unexpected calcification. Skull: Normal. Negative for fracture or focal lesion.  Sinuses/Orbits: Paranasal sinuses and mastoid air cells are clear. Orbits are clear. Other: None. IMPRESSION: 1. No acute intracranial findings. 2. Remote LEFT occipital infarction. Electronically Signed   By: Suzy Bouchard M.D.   On: 06/21/2017 18:11   Ct Angio Ao+bifem W & Or Wo Contrast  Result Date: 06/21/2017 CLINICAL DATA:  Right  foot numbness and cold to touch EXAM: CT ANGIOGRAPHY AOBIFEM WITHOUT AND WITH CONTRAST TECHNIQUE: Using angiographic technique, postcontrast CT images of the abdomen and pelvis and lower extremities were obtained, from the lung bases through both feet. CONTRAST:  159mL ISOVUE-370 IOPAMIDOL (ISOVUE-370) INJECTION 76% COMPARISON:  None. FINDINGS: VASCULAR FINDINGS Aorta: Normal caliber distal thoracic and abdominal aorta. There is moderate calcified and noncalcified plaque in the infrarenal aorta without hemodynamically significant stenosis. Celiac axis:  Normal. Superior mesenteric artery: Normal. Renal arteries: Single renal arteries bilaterally. No stenosis or other abnormality. Inferior mesenteric artery:  Normal. Inflow: There is severe, predominantly noncalcified plaque within the right common iliac artery that causes severe stenosis just proximal to the right iliac bifurcation. The right external iliac artery is widely patent. There is short segment occlusion of the right internal iliac artery which reconstitutes distally. There is moderate atherosclerotic noncalcified plaque in the left common iliac artery without hemodynamically significant stenosis. Mild atherosclerosis of the left internal and external iliac arteries without hemodynamically significant stenosis. RIGHT LOWER EXTREMITY Femoral artery: Mixed calcified and noncalcified plaque at the femoral bifurcation. The femoral and deep femoral branches remain widely patent. The course of the femoral artery is normal to the level of the knee. Popliteal artery: Normal. Runoff: The right posterior tibial artery is occluded at the level of the distal tibial metaphysis. The right dorsalis pedis is patent. The right peroneal artery progressive leak tapers as it approaches the ankle without an abrupt occlusion. LEFT LOWER EXTREMITY Femoral artery: Femoral and deep femoral branches are normal. Femoral artery is normal to the level of the knee. Popliteal artery:  Normal Runoff: There is normal three-vessel runoff to the level of the ankle. Review of the MIP images confirms the above findings. NONVASCULAR FINDINGS Lower chest: 7 mm right middle lobe nodule. No pleural effusion. There is multifocal pericardial calcification. Hepatobiliary: The liver is enlarged. Cholelithiasis without acute inflammation. Pancreas: Normal contours without ductal dilatation. No peripancreatic fluid collection. Spleen: Normal. Adrenals/Urinary Tract: --Adrenal glands: Normal. --Right kidney/ureter: No hydronephrosis or perinephric stranding. No nephrolithiasis. No obstructing ureteral stones. --Left kidney/ureter: No hydronephrosis or perinephric stranding. No nephrolithiasis. No obstructing ureteral stones. --Urinary bladder: Unremarkable. Stomach/Bowel: --Stomach/Duodenum: No hiatal hernia or other gastric abnormality. Normal duodenal course and caliber. --Small bowel: No dilatation or inflammation. --Colon: Rectosigmoid and descending colonic diverticulosis without diverticulitis. No other focal colonic abnormality. --Appendix: Surgically absent. Lymphatic:  No abdominal or pelvic lymphadenopathy. Reproductive: Mildly enlarged prostate. Musculoskeletal. No bony spinal canal stenosis or focal osseous abnormality. Other: None. IMPRESSION: 1. 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 2. Tapered appearance of the right peroneal artery as it approaches the ankle without abrupt occlusion. Patent right dorsalis pedis. 3. Normal left three-vessel runoff to the level of the ankle. 4. 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 5.  Aortic Atherosclerosis (ICD10-I70.0). 6. Cholelithiasis and diverticulosis without acute inflammation. 7. 7 mm right middle lobe pulmonary nodule. Non-contrast chest CT at  6-12 months is recommended. If the nodule is stable at time  of repeat CT, then future CT at 18-24 months (from today's scan) is considered optional for low-risk patients, but is recommended for high-risk patients. This recommendation follows the consensus statement: Guidelines for Management of Incidental Pulmonary Nodules Detected on CT Images: From the Fleischner Society 2017; Radiology 2017; 284:228-243. Critical Value/emergent results were called by telephone at the time of interpretation on 06/21/2017 at 6:48 pm to Dr. Larae Grooms , who verbally acknowledged these results. Electronically Signed   By: Ulyses Jarred M.D.   On: 06/21/2017 18:54   Dg Chest Port 1 View  Result Date: 06/21/2017 CLINICAL DATA:  Tachycardia.  Sweating. EXAM: PORTABLE CHEST 1 VIEW COMPARISON:  None. FINDINGS: The heart size and mediastinal contours are within normal limits. Both lungs are clear. The visualized skeletal structures are unremarkable. IMPRESSION: Normal exam. Electronically Signed   By: Lorriane Shire M.D.   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-incidental finding on CT Angio AO+Bifem 11/19 Incidental finding 7 mm pulmonary nodule on CT Scan 11/19 Hx: HTN and Diabetes Mellitus P: Supplemental O2 to maintain O2 sats >92% Continue Cardizem and Heparin gtts Cardiology, Vascular, and Neurology consulted appreciate input US Carotid Bilateral, MR Brain, MRA Brain, and Echo pending Trend troponin's Lipid panel pending Continuous telemetry monitoring Continue aspirin  Hold outpatient antihypertensive medications allow for permissive hypertension in setting of possible CVA Neuro checks q2hrs x12hrs then q4hrs  Trend CBC Monitor for s/sx of bleeding Transfuse for hgb <7 Speech consult  pending  CBG's ac/hs and SSI  Can monitor incidental finding of 7 mm pulmonary nodule in outpatient setting   [name], [place] Pager [phone] (please enter 7 digits) PCCM Consult Pager [phone] (please enter 7 digits)
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
## 2017-06-21 NOTE — ED Notes (Signed)
Pt urinated on self clothing changed and pt cleaned, POC discussed with pt, directions for family to ICU (and explanation for ICU) given

## 2017-06-21 NOTE — ED Triage Notes (Signed)
Pt to ER from Dr. Rance Muir office.  Pt denies history of a fib or any other heart problems.  Pt is alert, but forgetful at this time.  EDP at bedside at this time.

## 2017-06-22 ENCOUNTER — Inpatient Hospital Stay: Payer: BLUE CROSS/BLUE SHIELD

## 2017-06-22 ENCOUNTER — Inpatient Hospital Stay
Admit: 2017-06-22 | Discharge: 2017-06-22 | Disposition: A | Discharge status: Home / Self Care | Payer: BLUE CROSS/BLUE SHIELD | Attending: Family Medicine | Admitting: Family Medicine

## 2017-06-22 ENCOUNTER — Other Ambulatory Visit: Payer: Self-pay

## 2017-06-22 DIAGNOSIS — M6281 Muscle weakness (generalized): Secondary | ICD-10-CM

## 2017-06-22 DIAGNOSIS — I4891 Unspecified atrial fibrillation: Secondary | ICD-10-CM

## 2017-06-22 DIAGNOSIS — R000 Tachycardia, unspecified: Secondary | ICD-10-CM

## 2017-06-22 DIAGNOSIS — E119 Type 2 diabetes mellitus without complications: Secondary | ICD-10-CM

## 2017-06-22 DIAGNOSIS — I639 Cerebral infarction, unspecified: Secondary | ICD-10-CM

## 2017-06-22 LAB — HEPARIN LEVEL (UNFRACTIONATED)
HEPARIN UNFRACTIONATED: 0.34 [IU]/mL (ref 0.30–0.70)
Heparin Unfractionated: 0.1 IU/mL — ABNORMAL LOW (ref 0.30–0.70)
Heparin Unfractionated: 0.24 IU/mL — ABNORMAL LOW (ref 0.30–0.70)

## 2017-06-22 LAB — ECHOCARDIOGRAM COMPLETE
Height: 70 in
Weight: 3008 oz

## 2017-06-22 LAB — CBC
HEMATOCRIT: 46.3 % (ref 40.0–52.0)
Hemoglobin: 16.1 g/dL (ref 13.0–18.0)
MCH: 30.5 pg (ref 26.0–34.0)
MCHC: 34.7 g/dL (ref 32.0–36.0)
MCV: 87.8 fL (ref 80.0–100.0)
Platelets: 265 10*3/uL (ref 150–440)
RBC: 5.28 MIL/uL (ref 4.40–5.90)
RDW: 13.4 % (ref 11.5–14.5)
WBC: 13.5 10*3/uL — ABNORMAL HIGH (ref 3.8–10.6)

## 2017-06-22 LAB — LIPID PANEL
CHOLESTEROL: 145 mg/dL (ref 0–200)
HDL: 52 mg/dL (ref >40)
LDL CALC: 67 mg/dL (ref 0–99)
Total CHOL/HDL Ratio: 2.8 RATIO
Triglycerides: 128 mg/dL (ref <150)
VLDL: 26 mg/dL (ref 0–40)

## 2017-06-22 LAB — BASIC METABOLIC PANEL
Anion gap: 11 (ref 5–15)
BUN: 9 mg/dL (ref 6–20)
CALCIUM: 9.4 mg/dL (ref 8.9–10.3)
CO2: 23 mmol/L (ref 22–32)
Chloride: 103 mmol/L (ref 101–111)
Creatinine, Ser: 0.75 mg/dL (ref 0.61–1.24)
GFR calc Af Amer: >60 mL/min (ref >60)
GLUCOSE: 134 mg/dL — AB (ref 65–99)
POTASSIUM: 3.4 mmol/L — AB (ref 3.5–5.1)
Sodium: 137 mmol/L (ref 135–145)

## 2017-06-22 LAB — TROPONIN I: Troponin I: <0.03 ng/mL (ref <0.03)

## 2017-06-22 LAB — GLUCOSE, CAPILLARY
GLUCOSE-CAPILLARY: 127 mg/dL — AB (ref 65–99)
GLUCOSE-CAPILLARY: 214 mg/dL — AB (ref 65–99)
GLUCOSE-CAPILLARY: 127 mg/dL — AB (ref 65–99)
Glucose-Capillary: 152 mg/dL — ABNORMAL HIGH (ref 65–99)

## 2017-06-22 IMAGING — CT CT HEAD W/O CM
3 series · 15 of 47 positions shown, 18 images · non-contrast
Comparison: 06/21/2017 and 04/29/2015 CT of the head.

CLINICAL DATA: 61 y/o M; difficulties with memory and complaints of
right-sided weakness.

EXAM:
CT HEAD WITHOUT CONTRAST
TECHNIQUE: Contiguous axial images were obtained from the base of the skull
through the vertex without intravenous contrast.

[Series 2: head wo · axial · 0.42mm/px · z∈[+791,+926]mm · 9 of 33 slices shown, 12 images]
[im 3/33  brain]
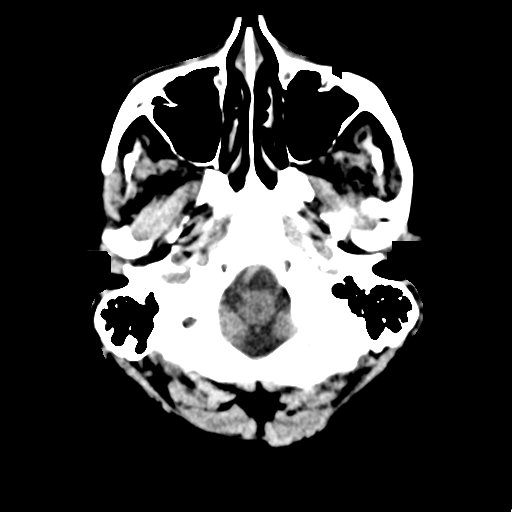
[im 3/33  bone]
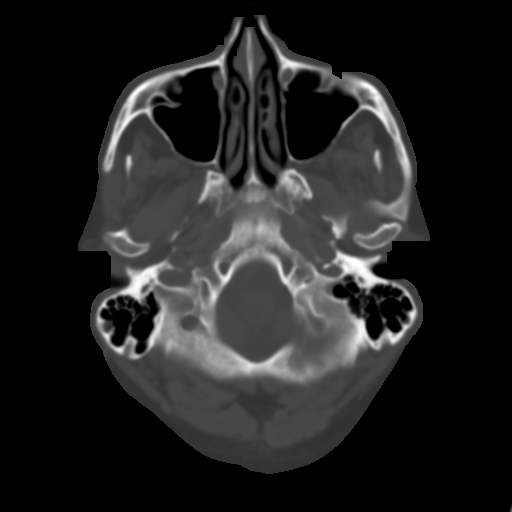
[im 6/33  brain]
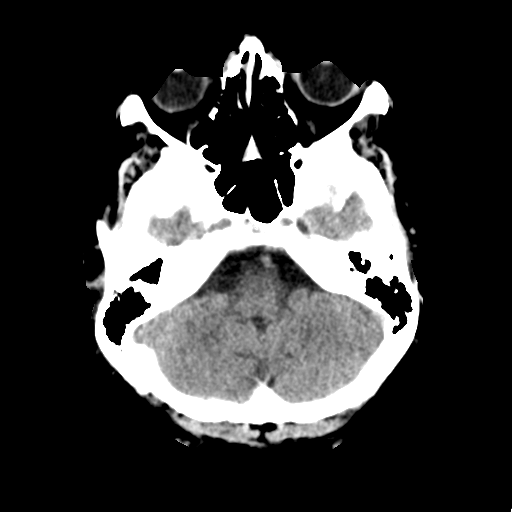
[im 9/33  brain]
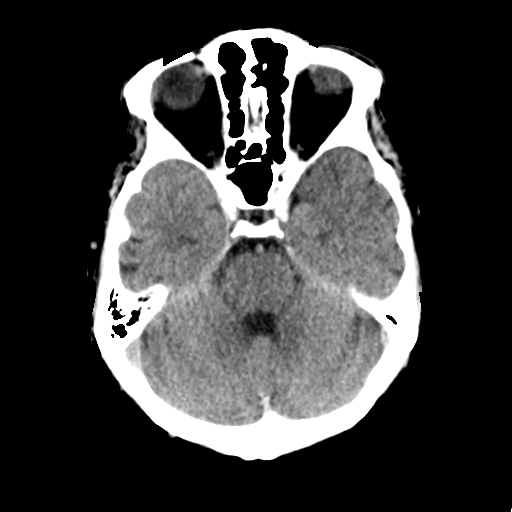
[im 13/33  brain]
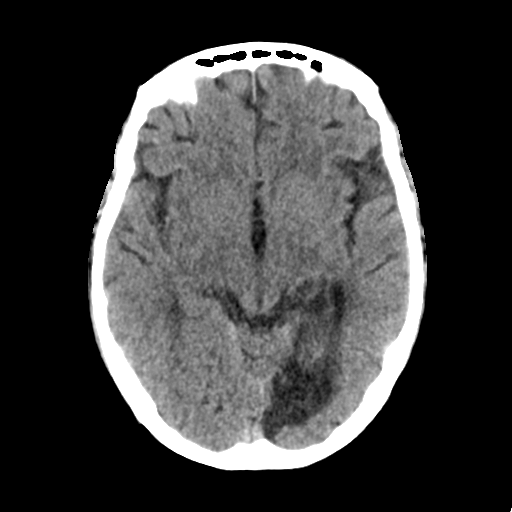
[im 17/33  brain]
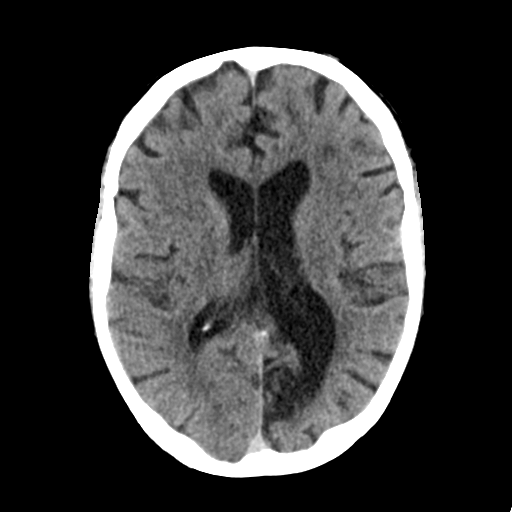
[im 17/33  bone]
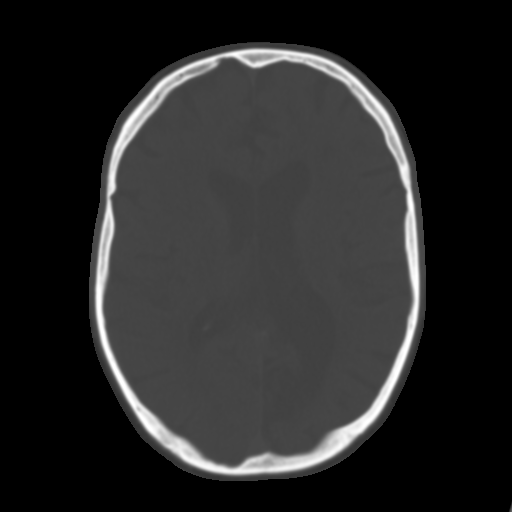
[im 20/33  brain]
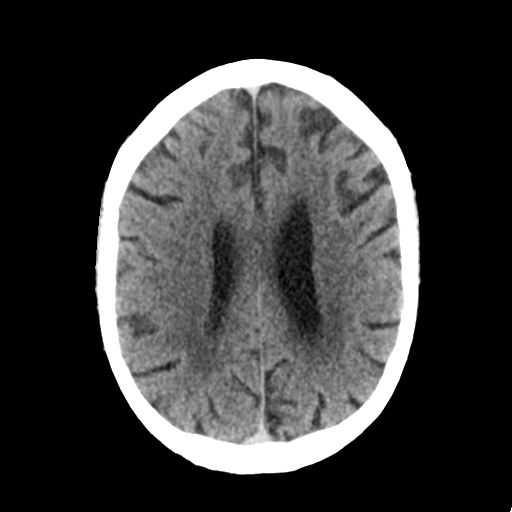
[im 24/33  brain]
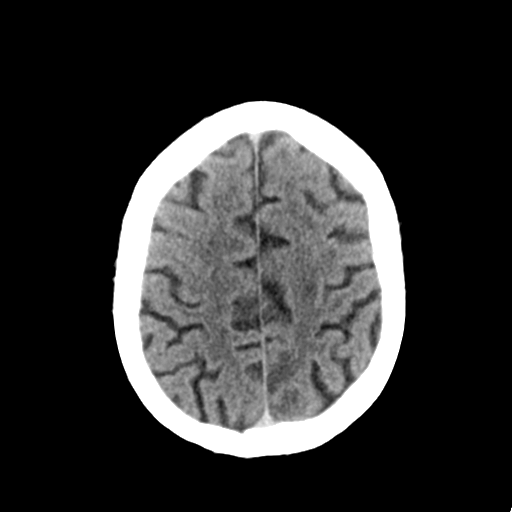
[im 27/33  brain]
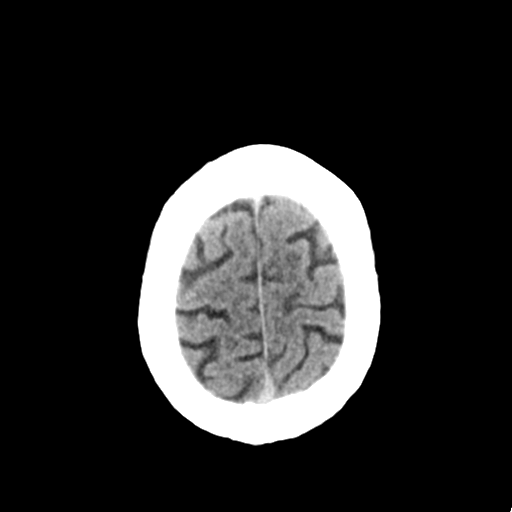
[im 30/33  brain]
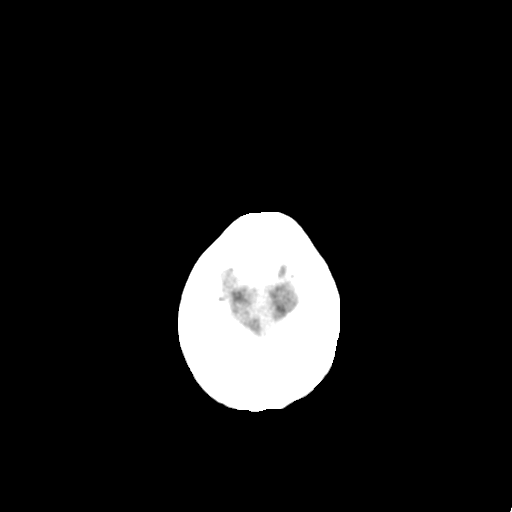
[im 30/33  bone]
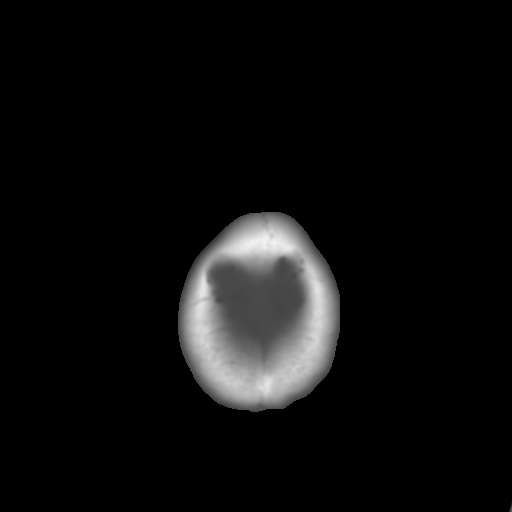

[Series 4: coronal soft tissue · coronal · 0.34mm/px · 3 of 62 slices shown]
[im 21/62  brain]
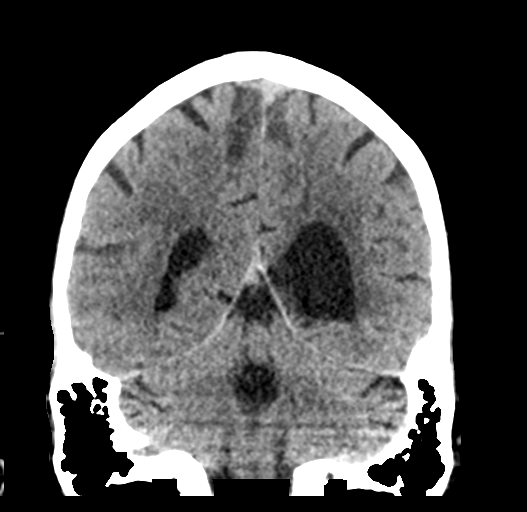
[im 28/62  brain]
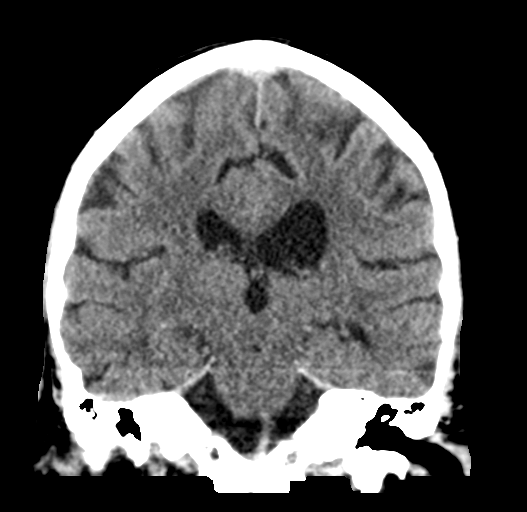
[im 34/62  brain]
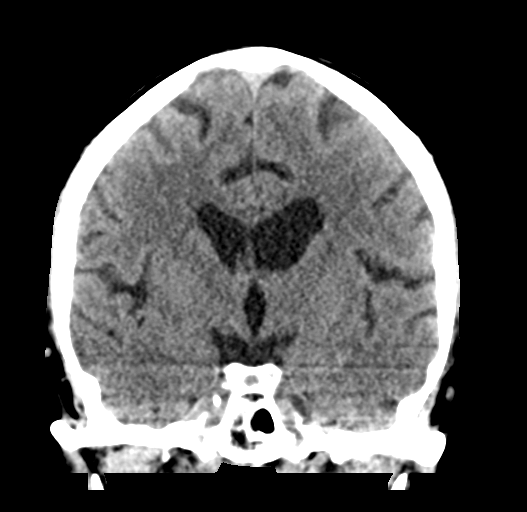

[Series 5: sagittal soft tissue · sagittal · 0.33mm/px · 3 of 50 slices shown]
[im 17/50  brain]
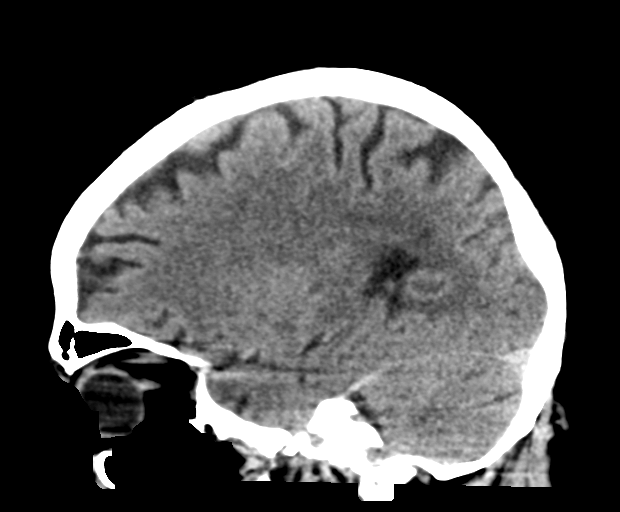
[im 25/50  brain]
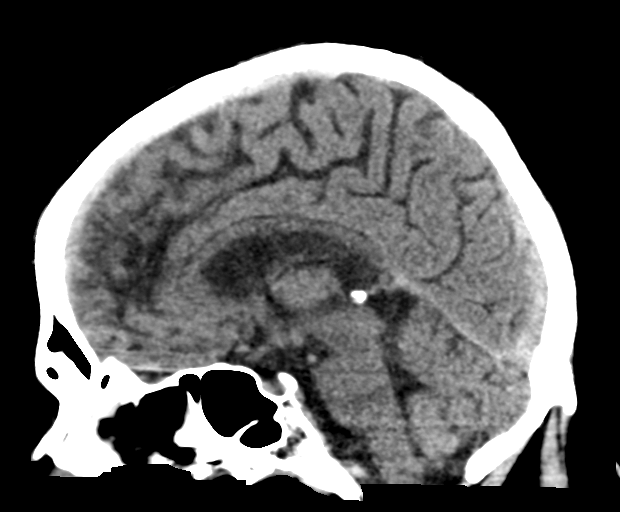
[im 33/50  brain]
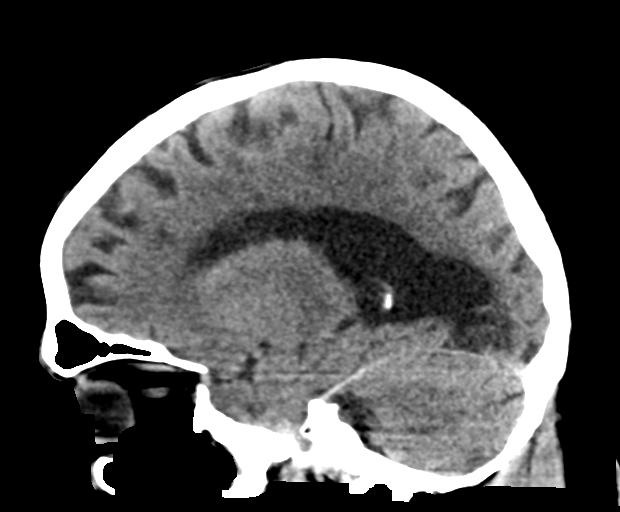

[15 of 47 positions shown; findings below may reference images not displayed]

FINDINGS: Brain: Small left precentral gyrus cortical lucency (series 2, image
27) and left medial parietal cortical lucency (series 2, image 24)
with increased conspicuity from prior CT of head compatible with
evolving acute infarction. No acute hemorrhage, mass effect, or
hydrocephalus. No extra-axial collection or effacement of basilar
cisterns. Chronic left occipital infarction.

Vascular: Mild calcific atherosclerosis of carotid siphons. No
hyperdense vessel.

Skull: Normal. Negative for fracture or focal lesion.

Sinuses/Orbits: No acute finding.

Other: None.
IMPRESSION: 1. Small cortical infarct in left precentral gyrus and left medial
parietal lobe with increased conspicuity, likely representing
evolving late acute/early subacute infarctions.
2. No hemorrhage or mass effect.
3. Stable chronic left occipital lobe infarction.
These results will be called to the ordering clinician or
representative by the Radiologist Assistant, and communication
documented in the PACS or zVision Dashboard.

By: Latonya Lent M.D.

## 2017-06-22 IMAGING — US US CAROTID DUPLEX BILAT
1 series · 13 of 24 positions shown · non-contrast
Comparison: None.

CLINICAL DATA: CVA

EXAM:
BILATERAL CAROTID DUPLEX ULTRASOUND
TECHNIQUE: Gray scale imaging, color Doppler and duplex ultrasound were
performed of bilateral carotid and vertebral arteries in the neck.

[Series 1: us carotid duplex bilat · 13 of 71 slices shown]
[im 1/71]
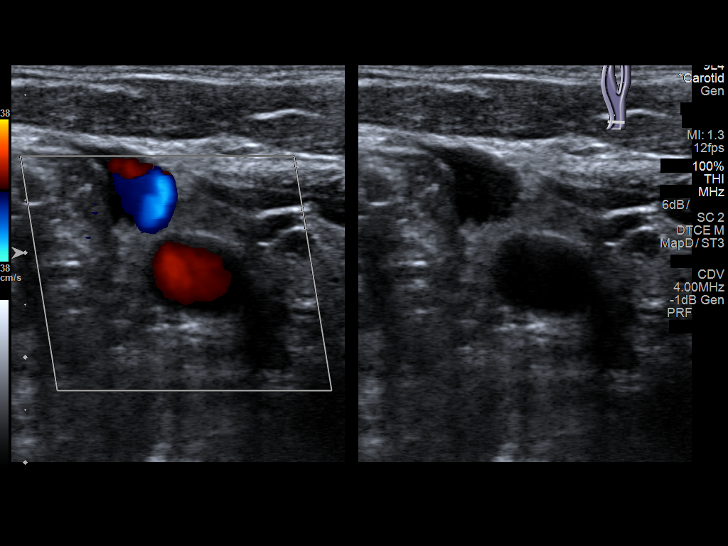
[im 7/71]
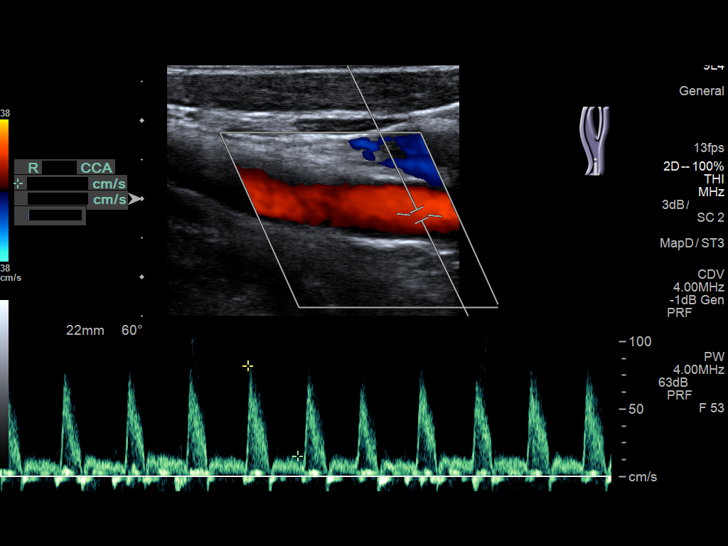
[im 13/71]
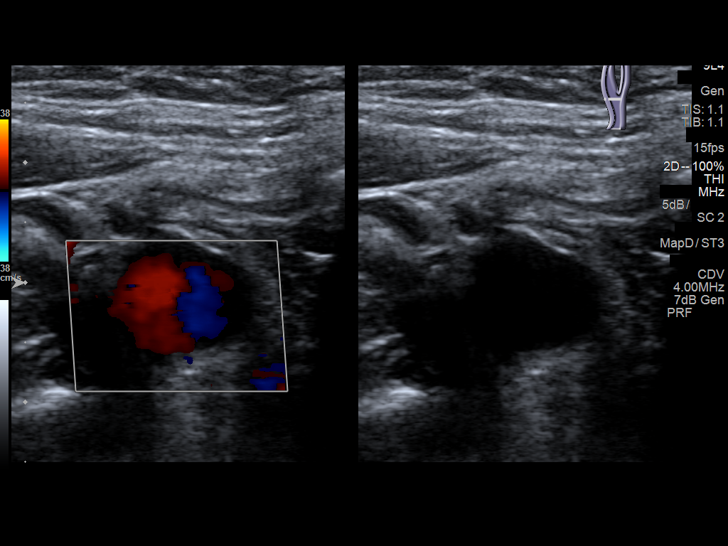
[im 19/71]
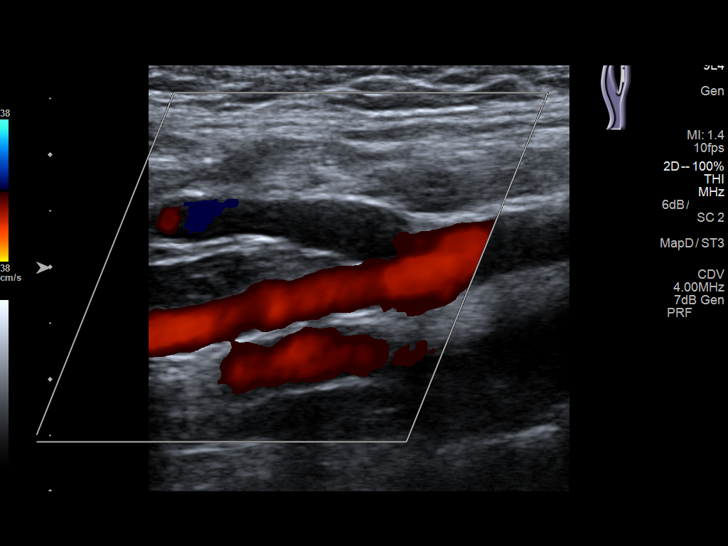
[im 25/71]
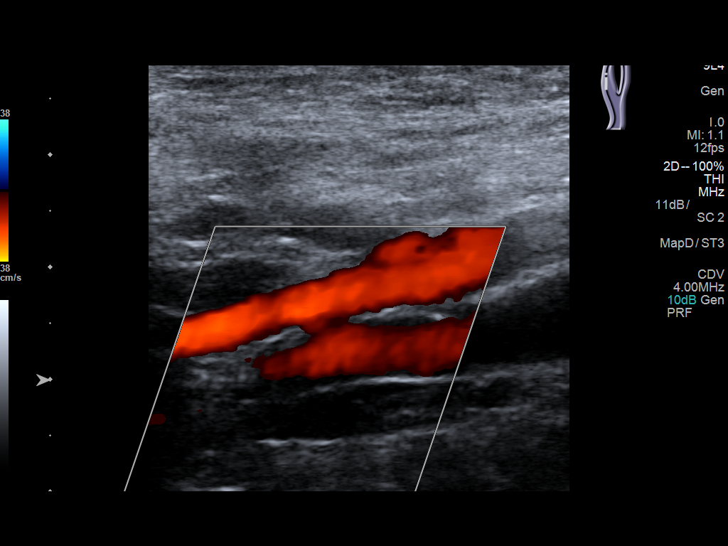
[im 31/71]
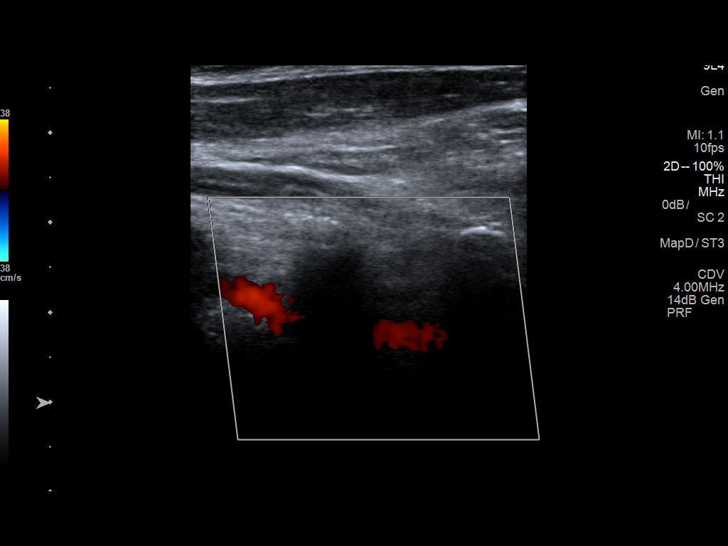
[im 37/71]
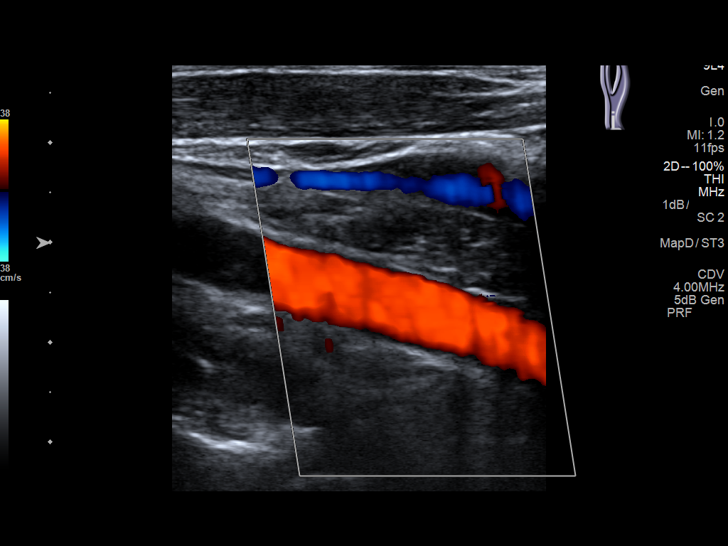
[im 40/71]
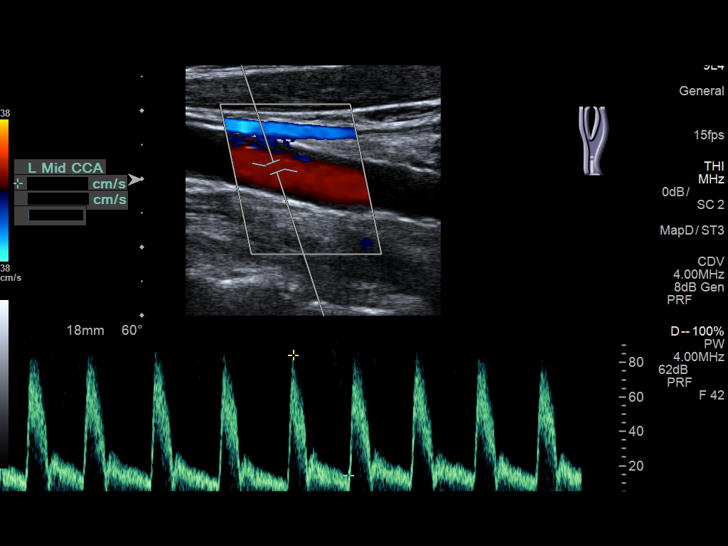
[im 46/71]
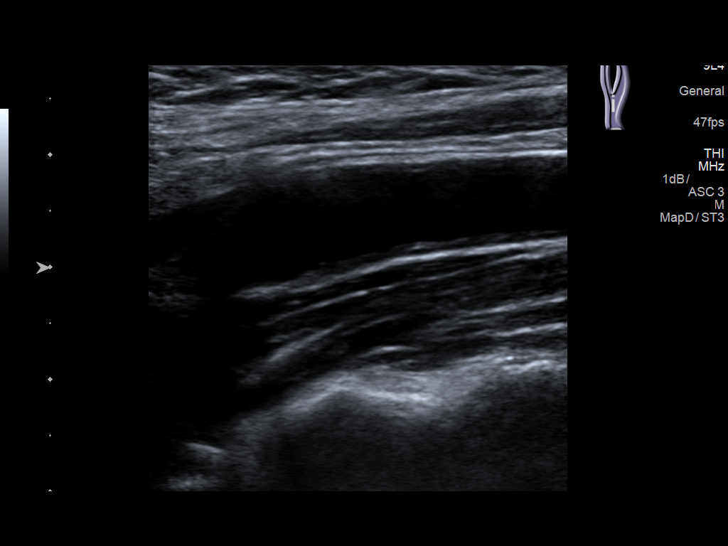
[im 52/71]
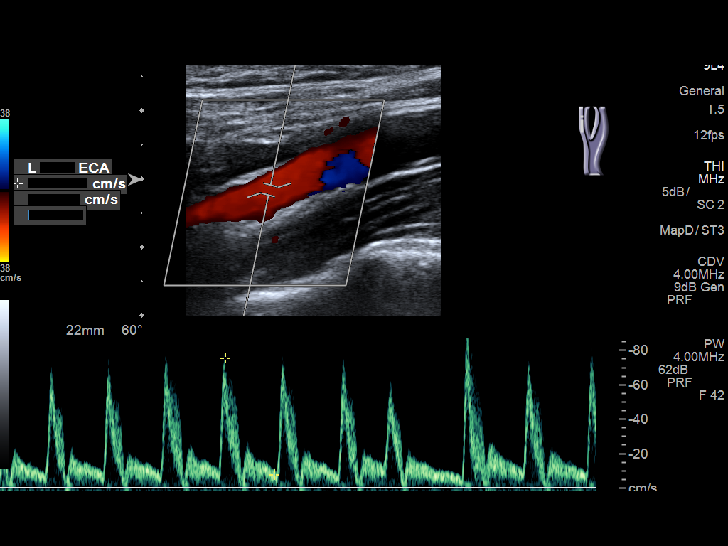
[im 58/71]
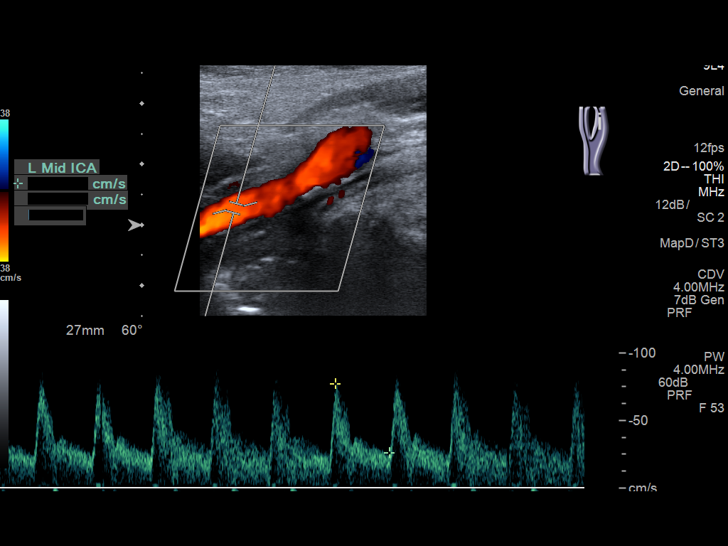
[im 64/71]
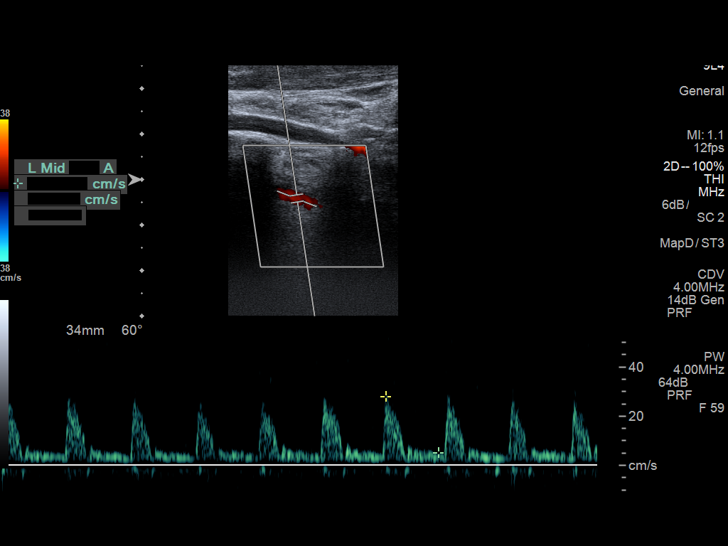
[im 71/71]
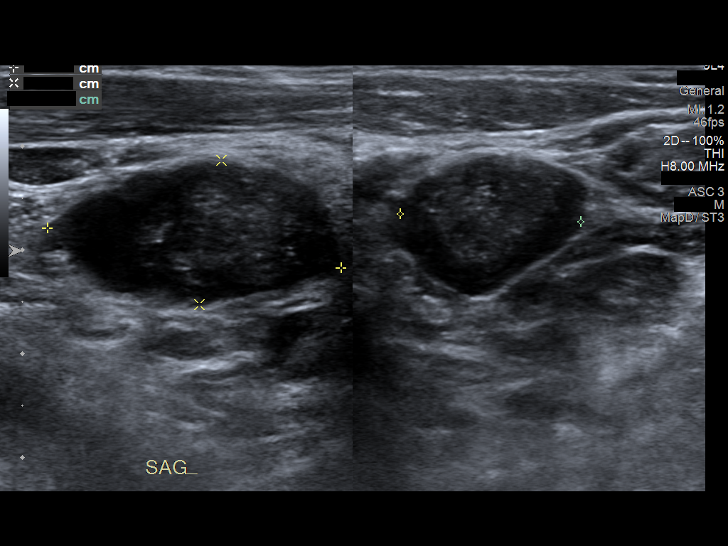

[13 of 24 positions shown; findings below may reference images not displayed]

FINDINGS: Criteria: Quantification of carotid stenosis is based on velocity
parameters that correlate the residual internal carotid diameter
with NASCET-based stenosis levels, using the diameter of the distal
internal carotid lumen as the denominator for stenosis measurement.

The following velocity measurements were obtained:

RIGHT

ICA:  76 cm/sec

CCA:  99 cm/sec

SYSTOLIC ICA/CCA RATIO:

DIASTOLIC ICA/CCA RATIO:

ECA:  94 cm/sec

LEFT

ICA:  83 cm/sec

CCA:  84 cm/sec

SYSTOLIC ICA/CCA RATIO:

DIASTOLIC ICA/CCA RATIO:

ECA:  76 cm/sec

RIGHT CAROTID ARTERY: Little if any plaque in the bulb. Low
resistance internal carotid Doppler pattern. Tachycardia.

RIGHT VERTEBRAL ARTERY:  Antegrade.

LEFT CAROTID ARTERY: Little if any plaque in the bulb. Low
resistance internal carotid Doppler pattern.

LEFT VERTEBRAL ARTERY:  Antegrade.

There is a large hypoechoic and heterogeneous mass in the left side
of the neck measuring 2.9 x 1.4 x 1.7 cm. A pathological and
enlarged lymph node is not excluded.
IMPRESSION: Less than 50% stenosis in the the right and left internal carotid
artery

There is a hypoechoic mass in the left side of the neck worrisome
for an enlarged lymph node. Malignancy is not excluded. CT neck with
contrast is recommended.

## 2017-06-22 MED ORDER — VITAMIN B-1 100 MG PO TABS
100.0000 mg | ORAL_TABLET | Freq: Every day | ORAL | Status: DC
  Administered 2017-06-22 – 2017-06-23 (×2): 100 mg via ORAL
  Filled 2017-06-22 (×2): qty 1

## 2017-06-22 MED ORDER — FOLIC ACID 1 MG PO TABS
1.0000 mg | ORAL_TABLET | Freq: Every day | ORAL | Status: DC
  Administered 2017-06-22 – 2017-06-23 (×2): 1 mg via ORAL
  Filled 2017-06-22 (×2): qty 1

## 2017-06-22 MED ORDER — POTASSIUM CHLORIDE CRYS ER 20 MEQ PO TBCR
40.0000 meq | EXTENDED_RELEASE_TABLET | Freq: Once | ORAL | Status: AC
  Administered 2017-06-22: 40 meq via ORAL
  Filled 2017-06-22: qty 2

## 2017-06-22 MED ORDER — POTASSIUM CHLORIDE CRYS ER 20 MEQ PO TBCR
20.0000 meq | EXTENDED_RELEASE_TABLET | Freq: Once | ORAL | Status: AC
  Administered 2017-06-22: 20 meq via ORAL
  Filled 2017-06-22: qty 1

## 2017-06-22 MED ORDER — ADULT MULTIVITAMIN W/MINERALS CH
1.0000 | ORAL_TABLET | Freq: Every day | ORAL | Status: DC
  Administered 2017-06-22 – 2017-06-23 (×2): 1 via ORAL
  Filled 2017-06-22 (×2): qty 1

## 2017-06-22 MED ORDER — LORAZEPAM 2 MG/ML IJ SOLN: 2.0000 mg | INTRAMUSCULAR | Status: DC | PRN

## 2017-06-22 MED ORDER — HEPARIN BOLUS VIA INFUSION
1200.0000 [IU] | Freq: Once | INTRAVENOUS | Status: AC
  Administered 2017-06-22: 1200 [IU] via INTRAVENOUS
  Filled 2017-06-22: qty 1200

## 2017-06-22 MED ORDER — HEPARIN BOLUS VIA INFUSION
2600.0000 [IU] | Freq: Once | INTRAVENOUS | Status: AC
  Administered 2017-06-22: 2600 [IU] via INTRAVENOUS
  Filled 2017-06-22: qty 2600

## 2017-06-22 MED ORDER — DILTIAZEM HCL 30 MG PO TABS
60.0000 mg | ORAL_TABLET | Freq: Four times a day (QID) | ORAL | Status: DC
  Administered 2017-06-22 – 2017-06-23 (×4): 60 mg via ORAL
  Filled 2017-06-22 (×2): qty 2
  Filled 2017-06-22 (×2): qty 1

## 2017-06-22 NOTE — Progress Notes (Signed)
PT Cancellation Note  Patient Details Name: Samrat Hayward. MRN: [id number] DOB: 01/05/1956   Cancelled Treatment:    Reason Eval/Treat Not Completed: Patient not medically ready. Order received, chart reviewed. New onset afibb with rvr requiring cardizem gtt and RLE DVT started on heparin gtt 5:56am 11/20. Per therapy protocol, <5 hours from initial therapeutic dose, pt contraindicated for therapy at this time. Additionally, several consults and further work up pending. Will hold this am and re-attempt PT evaluation at later date/time as medically appropriate and pending plan of care.      Malaney Mcbean 06/22/2017, 8:33 AM Greggory Stallion, PT, DPT [phone]

## 2017-06-22 NOTE — Progress Notes (Signed)
[place] for heparin drip Indication: atrial fibrillation  Allergies  Allergen Reactions  . Lipitor [Atorvastatin] Rash  . Gabapentin Other (See Comments)    Other reaction(s): Dizziness    Patient Measurements: Height: 5\' 10"  (177.8 cm) Weight: 188 lb (85.3 kg) IBW/kg (Calculated) : 73 Heparin Dosing Weight: 91.5 kg  Vital Signs: Temp: 97.9 F (36.6 C) (11/20 0800) Temp Source: Oral (11/20 0800) BP: 114/73 (11/20 1000) Pulse Rate: 86 (11/20 0900)  Labs: Recent Labs    06/21/17 1616  06/21/17 1822 06/21/17 2113 06/22/17 0208 06/22/17 0813 06/22/17 1055  HGB 16.9  --   --   --  16.1  --   --   HCT 49.0  --   --   --  46.3  --   --   PLT 363  --   --   --  265  --   --   APTT  --   --  66*  --   --   --   --   LABPROT 13.6  --   --   --   --   --   --   INR 1.05  --   --   --   --   --   --   HEPARINUNFRC  --   --   --   --  0.10*  --  0.24*  CREATININE 0.72  --   --   --  0.75  --   --   TROPONINI  --    < > <0.03 <0.03 <0.03 <0.03  --    < > = values in this interval not displayed.    Estimated Creatinine Clearance: 98.9 mL/min (by C-G formula based on SCr of 0.75 mg/dL).   Medical History: Past Medical History:  Diagnosis Date  . Diabetes ([place])   . History of hernia repair   . Hypertension   . Stroke [facility])     Medications:   Patient was not on any anticoagulants at home.   Assessment: 61yo male admitted with afib. Pharmacy has been consulted to dose and monitor heparin drip.  Goal of Therapy:  Heparin level 0.3-0.7 units/ml Monitor platelets by anticoagulation protocol: Yes   Plan:   Heparin level was subtherapeutic at 0.24. Will give 1200 unit bolus and increase the infusion rate to 1600 units/hr. A heparin level will be drawn 6 hours after the increase in infusion rate. Pharmacy will continue to monitor and adjust as needed.    Lendon Ka, PharmD Pharmacy Resident 06/22/2017

## 2017-06-22 NOTE — Progress Notes (Signed)
Rehab Admissions Coordinator Note:  Patient was screened by Cleatrice Burke for appropriateness for an Inpatient Acute Rehab Consult per PT recommendation. I await further medical workup and OT eval before contacting family to begin discussions for his options for rehab venues. I will follow up tomorrow. [phone].  Cleatrice Burke 06/22/2017, 4:07 PM  I can be reached at [phone].

## 2017-06-22 NOTE — Progress Notes (Signed)
ANTICOAGULATION CONSULT NOTE - Initial Consult  Pharmacy Consult for heparin drip Indication: atrial fibrillation  Allergies  Allergen Reactions  . Lipitor [Atorvastatin] Rash  . Gabapentin Other (See Comments)    Other reaction(s): Dizziness    Patient Measurements: Height: 5\' 10"  (177.8 cm) Weight: 188 lb (85.3 kg) IBW/kg (Calculated) : 73 Heparin Dosing Weight: 91.5 kg  Vital Signs: Temp: 98.4 F (36.9 C) (11/20 0000) Temp Source: Oral (11/20 0000) BP: 122/87 (11/19 2020) Pulse Rate: 98 (11/19 2118)  Labs: Recent Labs    06/21/17 1616 06/21/17 1822 06/21/17 2113 06/22/17 0208  HGB 16.9  --   --  16.1  HCT 49.0  --   --  46.3  PLT 363  --   --  265  APTT  --  66*  --   --   LABPROT 13.6  --   --   --   INR 1.05  --   --   --   HEPARINUNFRC  --   --   --  0.10*  CREATININE 0.72  --   --  0.75  TROPONINI  --  <0.03 <0.03 <0.03    Estimated Creatinine Clearance: 98.9 mL/min (by C-G formula based on SCr of 0.75 mg/dL).   Medical History: Past Medical History:  Diagnosis Date  . Diabetes ([place])   . History of hernia repair   . Hypertension   . Stroke [facility])     Medications:   Patient was not on any anticoagulants at home.   Assessment: 61yo male admitted with afib. Pharmacy has been consulted to dose and monitor heparin drip.  Goal of Therapy:  Heparin level 0.3-0.7 units/ml Monitor platelets by anticoagulation protocol: Yes   Plan:  Give 4500 units bolus x 1 Start heparin infusion at 1250 units/hr Check anti-Xa level in 6 hours and daily while on heparin Continue to monitor H&H and platelets  Heparin level will be drawn 6 hours after the drip is started. Pharmacy will continue to follow and adjust as needed.   11/20 @ 0200 HL 0.10 subtherapeutic. Of note pt had an aPTT 66 11/20. Will rebolus w/ heparin 2600 units IV x 1 and will increase rate to 1450 units/hr and will recheck HL @ 1100 w/ CBC check w/ am labs.  Tobie Lords, PharmD,  BCPS Clinical Pharmacist 06/22/2017

## 2017-06-22 NOTE — Progress Notes (Signed)
Bed alarm on and functioning properly. Call bell within reach. Frequent monitoring to ensure patient's safety. PRN ativan administered. Will continue to monitor and endorse.

## 2017-06-22 NOTE — Progress Notes (Signed)
Speech Therapy Note: reviewed chart notes; consulted NSG re: pt's status currently. Pt is verbally conversive and making wants/needs known per her report. NSG stated she had not seen any swallowing problems w/ meals/meds.  NSG indicated pt was transferring to the floor shortly. ST services will f/u w/ pt once situated in his new room for any education/assessment of cognitive-linguistic issues as per order. NSG agreed.    Orinda Kenner, [place], CCC-SLP

## 2017-06-22 NOTE — Consult Note (Signed)
[facility] Cardiology  CARDIOLOGY CONSULT NOTE  Patient ID: [name]. MRN: [id number] DOB/AGE: 62-Aug-1957 62 y.o.  Admit date: 06/21/2017 Referring Physician [place] Primary Physician [name], DO Primary Cardiologist [facility] Reason for Consultation new onset atrial fibrillation with RVR  HPI: 61 year old male referred for evaluation of new onset atrial fibrillation with RVR.  The patient has a known history of atrial fibrillation, previous CVA with residual memory deficits, type 2 diabetes, hyperlipidemia, hypertension, persistent headaches, and previous alcohol abuse. The patient presented in urgent care yesterday for evaluation of right leg weakness and numbness since 9 PM the day before, and was noted to be in atrial fibrillation with RVR, and was referred to [facility] emergency department.  The patient was started on Cardizem drip with improvement of rate.  Head CT negative for acute abnormality.  CT angiography revealed multiple abnormalities, most significantly an occlusion of the right posterior tibial artery. The patient was started on heparin drip. Chest x-ray negative. The patient's heart rate is now controlled. Currently, he reports feeling fine, and denies significant chest pain, shortness of breath, palpitations, headache, or arm weakness. He states his right leg feels much better.   Review of systems complete and found to be negative unless listed above     Past Medical History:  Diagnosis Date  . Diabetes ([place])   . History of hernia repair   . Hypertension   . Stroke [name])     Past Surgical History:  Procedure Laterality Date  . APPENDECTOMY    . HERNIA REPAIR      Medications Prior to Admission  Medication Sig Dispense Refill Last Dose  . clopidogrel (PLAVIX) 75 MG tablet TAKE 1 (ONE) TABLET PO BY MOUTH DAILY 90 tablet 3 06/21/2017 at 0800  . empagliflozin (JARDIANCE) 10 MG TABS tablet Take 10 mg by mouth daily. (Patient taking differently: Take 5 mg daily  by mouth. ) 90 tablet 3 06/21/2017 at 0800  . lisinopril-hydrochlorothiazide (PRINZIDE,ZESTORETIC) 10-12.5 MG tablet Take 1 tablet by mouth daily. 90 tablet 3 06/21/2017 at 0800  . metFORMIN (GLUCOPHAGE) 1000 MG tablet Take 1 tablet (1,000 mg total) by mouth 2 (two) times daily with a meal. 180 tablet 3 06/21/2017 at 0800  . simvastatin (ZOCOR) 40 MG tablet TAKE 1 TABLET (40 MG TOTAL) BY MOUTH AT BEDTIME. 90 tablet 3 06/20/2017 at 2000  . ciclopirox (PENLAC) 8 % solution Apply over nail at Bedtime and surrounding skin. Apply daily over previous coat. After seven (7) days, may remove with alcohol and continue (Patient not taking: Reported on 06/21/2017) 6.6 mL 0 Not Taking at Unknown time  . oxyCODONE (OXYCONTIN) 10 mg 12 hr tablet Take 1 tablet (10 mg total) by mouth every 12 (twelve) hours. 20 tablet 0 prn at prn   Social History   Socioeconomic History  . Marital status: Married    Spouse name: Not on file  . Number of children: 0  . Years of education: Not on file  . Highest education level: Not on file  Social Needs  . Financial resource strain: Not on file  . Food insecurity - worry: Not on file  . Food insecurity - inability: Not on file  . Transportation needs - medical: Not on file  . Transportation needs - non-medical: Not on file  Occupational History  . Not on file  Tobacco Use  . Smoking status: Current Every Day Smoker    Packs/day: 0.50    Years: 30.00    Pack years: 15.00  Types: Cigarettes  . Smokeless tobacco: Never Used  Substance and Sexual Activity  . Alcohol use: Yes    Alcohol/week: 4.2 oz    Types: 7 Cans of beer per week    Comment: occasionally  . Drug use: No  . Sexual activity: Yes  Other Topics Concern  . Not on file  Social History Narrative  . Not on file    Family History  Problem Relation Age of Onset  . Diabetes Father   . Hypertension Father   . Hyperlipidemia Father       Review of systems complete and found to be negative unless  listed above      PHYSICAL EXAM  General: Well developed, well nourished, in no acute distress HEENT:  Normocephalic and atramatic Neck:  Supple Lungs: Normal effort of breathing on room air. No wheezing. Heart: HRRR . Normal S1 and S2 without gallops or murmurs.  Abdomen: Bowel sounds are positive, abdomen soft Msk:  Back normal, sitting up in bed. Normal strength and tone for age. Extremities: No clubbing, cyanosis or edema. Distal pulses intact Neuro: Alert and oriented X 3. Psych:  Good affect, responds appropriately  Labs:   Lab Results  Component Value Date   WBC 13.5 (H) 06/22/2017   HGB 16.1 06/22/2017   HCT 46.3 06/22/2017   MCV 87.8 06/22/2017   PLT 265 06/22/2017    Recent Labs  Lab 06/22/17 0208  NA 137  K 3.4*  CL 103  CO2 23  BUN 9  CREATININE 0.75  CALCIUM 9.4  GLUCOSE 134*   Lab Results  Component Value Date   TROPONINI <0.03 06/22/2017    Lab Results  Component Value Date   CHOL 145 06/22/2017   CHOL 174 05/20/2017   CHOL 209 (H) 11/02/2016   Lab Results  Component Value Date   HDL 52 06/22/2017   HDL 52 05/20/2017   HDL 45 08/09/2015   Lab Results  Component Value Date   LDLCALC 67 06/22/2017   LDLCALC 85 05/20/2017   LDLCALC 76 08/09/2015   Lab Results  Component Value Date   TRIG 128 06/22/2017   TRIG 185 (H) 05/20/2017   TRIG 261 (H) 11/02/2016   Lab Results  Component Value Date   CHOLHDL 2.8 06/22/2017   No results found for: LDLDIRECT    Radiology: Ct Head Wo Contrast  Result Date: 06/21/2017 CLINICAL DATA:  Pt reports Right foot going numb last night, unknown as to what time. Pt also has hx of diabetes. Pt thinks numbness started around 9pm. Pt states he cannot lift his R leg at all at this time. Decreased right lower ext pulse a.*comment was truncated*^13mL ISOVUE-370 IOPAMIDOL (ISOVUE-370) INJECTION 76% EXAM: CT HEAD WITHOUT CONTRAST TECHNIQUE: Contiguous axial images were obtained from the base of the skull  through the vertex without intravenous contrast. COMPARISON:  04/29/2015 FINDINGS: Brain: No acute intracranial hemorrhage. No focal mass lesion. No CT evidence of acute infarction. No midline shift or mass effect. No hydrocephalus. Basilar cisterns are patent. Remote LEFT occipital infarction with encephalomalacia. Vascular: No hyperdense vessel or unexpected calcification. Skull: Normal. Negative for fracture or focal lesion. Sinuses/Orbits: Paranasal sinuses and mastoid air cells are clear. Orbits are clear. Other: None. IMPRESSION: 1. No acute intracranial findings. 2. Remote LEFT occipital infarction. Electronically Signed   By: Suzy Bouchard M.D.   On: 06/21/2017 18:11   Ct Angio Ao+bifem W & Or Wo Contrast  Result Date: 06/21/2017 CLINICAL DATA:  Right foot numbness and  cold to touch EXAM: CT ANGIOGRAPHY AOBIFEM WITHOUT AND WITH CONTRAST TECHNIQUE: Using angiographic technique, postcontrast CT images of the abdomen and pelvis and lower extremities were obtained, from the lung bases through both feet. CONTRAST:  174mL ISOVUE-370 IOPAMIDOL (ISOVUE-370) INJECTION 76% COMPARISON:  None. FINDINGS: VASCULAR FINDINGS Aorta: Normal caliber distal thoracic and abdominal aorta. There is moderate calcified and noncalcified plaque in the infrarenal aorta without hemodynamically significant stenosis. Celiac axis:  Normal. Superior mesenteric artery: Normal. Renal arteries: Single renal arteries bilaterally. No stenosis or other abnormality. Inferior mesenteric artery:  Normal. Inflow: There is severe, predominantly noncalcified plaque within the right common iliac artery that causes severe stenosis just proximal to the right iliac bifurcation. The right external iliac artery is widely patent. There is short segment occlusion of the right internal iliac artery which reconstitutes distally. There is moderate atherosclerotic noncalcified plaque in the left common iliac artery without hemodynamically significant  stenosis. Mild atherosclerosis of the left internal and external iliac arteries without hemodynamically significant stenosis. RIGHT LOWER EXTREMITY Femoral artery: Mixed calcified and noncalcified plaque at the femoral bifurcation. The femoral and deep femoral branches remain widely patent. The course of the femoral artery is normal to the level of the knee. Popliteal artery: Normal. Runoff: The right posterior tibial artery is occluded at the level of the distal tibial metaphysis. The right dorsalis pedis is patent. The right peroneal artery progressive leak tapers as it approaches the ankle without an abrupt occlusion. LEFT LOWER EXTREMITY Femoral artery: Femoral and deep femoral branches are normal. Femoral artery is normal to the level of the knee. Popliteal artery: Normal Runoff: There is normal three-vessel runoff to the level of the ankle. Review of the MIP images confirms the above findings. NONVASCULAR FINDINGS Lower chest: 7 mm right middle lobe nodule. No pleural effusion. There is multifocal pericardial calcification. Hepatobiliary: The liver is enlarged. Cholelithiasis without acute inflammation. Pancreas: Normal contours without ductal dilatation. No peripancreatic fluid collection. Spleen: Normal. Adrenals/Urinary Tract: --Adrenal glands: Normal. --Right kidney/ureter: No hydronephrosis or perinephric stranding. No nephrolithiasis. No obstructing ureteral stones. --Left kidney/ureter: No hydronephrosis or perinephric stranding. No nephrolithiasis. No obstructing ureteral stones. --Urinary bladder: Unremarkable. Stomach/Bowel: --Stomach/Duodenum: No hiatal hernia or other gastric abnormality. Normal duodenal course and caliber. --Small bowel: No dilatation or inflammation. --Colon: Rectosigmoid and descending colonic diverticulosis without diverticulitis. No other focal colonic abnormality. --Appendix: Surgically absent. Lymphatic:  No abdominal or pelvic lymphadenopathy. Reproductive: Mildly enlarged  prostate. Musculoskeletal. No bony spinal canal stenosis or focal osseous abnormality. Other: None. IMPRESSION: 1. 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 2. Tapered appearance of the right peroneal artery as it approaches the ankle without abrupt occlusion. Patent right dorsalis pedis. 3. Normal left three-vessel runoff to the level of the ankle. 4. 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 5.  Aortic Atherosclerosis (ICD10-I70.0). 6. Cholelithiasis and diverticulosis without acute inflammation. 7. 7 mm right middle lobe pulmonary nodule. Non-contrast chest CT at 6-12 months is recommended. If the nodule is stable at time of repeat CT, then future CT at 18-24 months (from today's scan) is considered optional for low-risk patients, but is recommended for high-risk patients. This recommendation follows the consensus statement: Guidelines for Management of Incidental Pulmonary Nodules Detected on CT Images: From the Fleischner Society 2017; Radiology 2017; 284:228-243. Critical Value/emergent results were called by telephone at the time of interpretation  on 06/21/2017 at 6:48 pm to Dr. Larae Grooms , who verbally acknowledged these results. Electronically Signed   By: Ulyses Jarred M.D.   On: 06/21/2017 18:54   Dg Chest Port 1 View  Result Date: 06/21/2017 CLINICAL DATA:  Tachycardia.  Sweating. EXAM: PORTABLE CHEST 1 VIEW COMPARISON:  None. FINDINGS: The heart size and mediastinal contours are within normal limits. Both lungs are clear. The visualized skeletal structures are unremarkable. IMPRESSION: Normal exam. Electronically Signed   By: Lorriane Shire M.D.   On: 06/21/2017 17:10    EKG: Sinus rhythm, 82 bpm  ASSESSMENT AND PLAN:  1. New-onset atrial fibrillation with RVR, on cardizem drip with  improvement in rate, asymptomatic 2. 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 History of stroke 2 years ago 4. Essential hypertension, blood pressure well controlled 5. Hyperlipidemia, on simvastatin  Recommendations: 1. Agree with overall therapy 2. Review 2D echocardiogram 3. Will plan to start Xarelto for stroke prevention after 24-48 hours of heparin drip 4. Titrate Cardizem drip down to off; start Cardizem 60 mg q 6 hours  Signed: Clabe Seal PA-C 06/22/2017, 8:52 AM

## 2017-06-22 NOTE — Progress Notes (Signed)
[place] at [facility]                                                                                                                                                                                  Patient Demographics   [name], is a 61 y.o. male, DOB - [birth year], YSA:[id number]  Admit date - 06/21/2017   Admitting Physician Gorden Harms, MD  Outpatient Primary MD for the patient is Park Liter P, DO   LOS - 1  Subjective: Patient admitted with right-sided CVA, patient complains of difficulty moving with his right leg    Review of Systems:   CONSTITUTIONAL: No documented fever. No fatigue, weakness. No weight gain, no weight loss.  EYES: No blurry or double vision.  ENT: No tinnitus. No postnasal drip. No redness of the oropharynx.  RESPIRATORY: No cough, no wheeze, no hemoptysis. No dyspnea.  CARDIOVASCULAR: No chest pain. No orthopnea. No palpitations. No syncope.  GASTROINTESTINAL: No nausea, no vomiting or diarrhea. No abdominal pain. No melena or hematochezia.  GENITOURINARY: No dysuria or hematuria.  ENDOCRINE: No polyuria or nocturia. No heat or cold intolerance.  HEMATOLOGY: No anemia. No bruising. No bleeding.  INTEGUMENTARY: No rashes. No lesions.  MUSCULOSKELETAL: No arthritis. No swelling. No gout.  NEUROLOGIC: Right leg weakness PSYCHIATRIC: No anxiety. No insomnia. No ADD.    Vitals:   Vitals:   06/22/17 0700 06/22/17 0800 06/22/17 0900 06/22/17 1000  BP:  129/79  114/73  Pulse: 73 83 86   Resp: 15 20 18 16   Temp:  97.9 F (36.6 C)    TempSrc:  Oral    SpO2: 95% 97% 100%   Weight:      Height:        Wt Readings from Last 3 Encounters:  06/21/17 188 lb (85.3 kg)  05/20/17 188 lb (85.3 kg)  02/15/17 189 lb 1 oz (85.8 kg)     Intake/Output Summary (Last 24 hours) at 06/22/2017 1610 Last data filed at 06/22/2017 0818 Gross per 24 hour  Intake 1187.65 ml  Output 850 ml  Net 337.65 ml    Physical  Exam:   GENERAL: Pleasant-appearing in no apparent distress.  HEAD, EYES, EARS, NOSE AND THROAT: Atraumatic, normocephalic. Extraocular muscles are intact. Pupils equal and reactive to light. Sclerae anicteric. No conjunctival injection. No oro-pharyngeal erythema.  NECK: Supple. There is no jugular venous distention. No bruits, no lymphadenopathy, no thyromegaly.  HEART: Regular rate and rhythm,. No murmurs, no rubs, no clicks.  LUNGS: Clear to auscultation bilaterally. No rales or rhonchi. No wheezes.  ABDOMEN: Soft, flat, nontender, nondistended. Has good bowel sounds. No hepatosplenomegaly appreciated.  EXTREMITIES: No  evidence of any cyanosis, clubbing, or peripheral edema.  +2 pedal and radial pulses bilaterally.  NEUROLOGIC: Right leg 2/5 strength, right upper extremity left upper extremity left lower extremity strength normal SKIN: Moist and warm with no rashes appreciated.  Psych: Not anxious, depressed LN: No inguinal LN enlargement    Antibiotics   Anti-infectives (From admission, onward)   None      Medications   Scheduled Meds: . aspirin EC  81 mg Oral Daily  . diltiazem  60 mg Oral M0N  . folic acid  1 mg Oral Daily  . insulin aspart  0-15 Units Subcutaneous TID WC  . insulin aspart  0-5 Units Subcutaneous QHS  . multivitamin with minerals  1 tablet Oral Daily  . nicotine  21 mg Transdermal Daily  . simvastatin  40 mg Oral q1800  . thiamine  100 mg Oral Daily   Continuous Infusions: . heparin 1,600 Units/hr (06/22/17 1323)   PRN Meds:.acetaminophen **OR** [DISCONTINUED] acetaminophen (TYLENOL) oral liquid 160 mg/5 mL **OR** [DISCONTINUED] acetaminophen, LORazepam, oxyCODONE, senna-docusate   Data Review:   Micro Results Recent Results (from the past 240 hour(s))  MRSA PCR Screening     Status: None   Collection Time: 06/21/17  9:23 PM  Result Value Ref Range Status   MRSA by PCR NEGATIVE NEGATIVE Final    Comment:        The GeneXpert MRSA Assay  (FDA approved for NASAL specimens only), is one component of a comprehensive MRSA colonization surveillance program. It is not intended to diagnose MRSA infection nor to guide or monitor treatment for MRSA infections.     Radiology Reports Ct Head Wo Contrast  Result Date: 06/22/2017 CLINICAL DATA:  61 y/o M; difficulties with memory and complaints of right-sided weakness. EXAM: CT HEAD WITHOUT CONTRAST TECHNIQUE: Contiguous axial images were obtained from the base of the skull through the vertex without intravenous contrast. COMPARISON:  06/21/2017 and 04/29/2015 CT of the head. FINDINGS: Brain: Small left precentral gyrus cortical lucency (series 2, image 27) and left medial parietal cortical lucency (series 2, image 24) with increased conspicuity from prior CT of head compatible with evolving acute infarction. No acute hemorrhage, mass effect, or hydrocephalus. No extra-axial collection or effacement of basilar cisterns. Chronic left occipital infarction. Vascular: Mild calcific atherosclerosis of carotid siphons. No hyperdense vessel. Skull: Normal. Negative for fracture or focal lesion. Sinuses/Orbits: No acute finding. Other: None. IMPRESSION: 1. Small cortical infarct in left precentral gyrus and left medial parietal lobe with increased conspicuity, likely representing evolving late acute/early subacute infarctions. 2. No hemorrhage or mass effect. 3. Stable chronic left occipital lobe infarction. These results will be called to the ordering clinician or representative by the Radiologist Assistant, and communication documented in the PACS or zVision Dashboard. Electronically Signed   By: Kristine Garbe M.D.   On: 06/22/2017 15:38   Ct Head Wo Contrast  Result Date: 06/21/2017 CLINICAL DATA:  Pt reports Right foot going numb last night, unknown as to what time. Pt also has hx of diabetes. Pt thinks numbness started around 9pm. Pt states he cannot lift his R leg at all at this  time. Decreased right lower ext pulse a.*comment was truncated*^129mL ISOVUE-370 IOPAMIDOL (ISOVUE-370) INJECTION 76% EXAM: CT HEAD WITHOUT CONTRAST TECHNIQUE: Contiguous axial images were obtained from the base of the skull through the vertex without intravenous contrast. COMPARISON:  04/29/2015 FINDINGS: Brain: No acute intracranial hemorrhage. No focal mass lesion. No CT evidence of acute infarction. No midline shift or  mass effect. No hydrocephalus. Basilar cisterns are patent. Remote LEFT occipital infarction with encephalomalacia. Vascular: No hyperdense vessel or unexpected calcification. Skull: Normal. Negative for fracture or focal lesion. Sinuses/Orbits: Paranasal sinuses and mastoid air cells are clear. Orbits are clear. Other: None. IMPRESSION: 1. No acute intracranial findings. 2. Remote LEFT occipital infarction. Electronically Signed   By: Suzy Bouchard M.D.   On: 06/21/2017 18:11   Ct Angio Ao+bifem W & Or Wo Contrast  Result Date: 06/21/2017 CLINICAL DATA:  Right foot numbness and cold to touch EXAM: CT ANGIOGRAPHY AOBIFEM WITHOUT AND WITH CONTRAST TECHNIQUE: Using angiographic technique, postcontrast CT images of the abdomen and pelvis and lower extremities were obtained, from the lung bases through both feet. CONTRAST:  166mL ISOVUE-370 IOPAMIDOL (ISOVUE-370) INJECTION 76% COMPARISON:  None. FINDINGS: VASCULAR FINDINGS Aorta: Normal caliber distal thoracic and abdominal aorta. There is moderate calcified and noncalcified plaque in the infrarenal aorta without hemodynamically significant stenosis. Celiac axis:  Normal. Superior mesenteric artery: Normal. Renal arteries: Single renal arteries bilaterally. No stenosis or other abnormality. Inferior mesenteric artery:  Normal. Inflow: There is severe, predominantly noncalcified plaque within the right common iliac artery that causes severe stenosis just proximal to the right iliac bifurcation. The right external iliac artery is widely patent.  There is short segment occlusion of the right internal iliac artery which reconstitutes distally. There is moderate atherosclerotic noncalcified plaque in the left common iliac artery without hemodynamically significant stenosis. Mild atherosclerosis of the left internal and external iliac arteries without hemodynamically significant stenosis. RIGHT LOWER EXTREMITY Femoral artery: Mixed calcified and noncalcified plaque at the femoral bifurcation. The femoral and deep femoral branches remain widely patent. The course of the femoral artery is normal to the level of the knee. Popliteal artery: Normal. Runoff: The right posterior tibial artery is occluded at the level of the distal tibial metaphysis. The right dorsalis pedis is patent. The right peroneal artery progressive leak tapers as it approaches the ankle without an abrupt occlusion. LEFT LOWER EXTREMITY Femoral artery: Femoral and deep femoral branches are normal. Femoral artery is normal to the level of the knee. Popliteal artery: Normal Runoff: There is normal three-vessel runoff to the level of the ankle. Review of the MIP images confirms the above findings. NONVASCULAR FINDINGS Lower chest: 7 mm right middle lobe nodule. No pleural effusion. There is multifocal pericardial calcification. Hepatobiliary: The liver is enlarged. Cholelithiasis without acute inflammation. Pancreas: Normal contours without ductal dilatation. No peripancreatic fluid collection. Spleen: Normal. Adrenals/Urinary Tract: --Adrenal glands: Normal. --Right kidney/ureter: No hydronephrosis or perinephric stranding. No nephrolithiasis. No obstructing ureteral stones. --Left kidney/ureter: No hydronephrosis or perinephric stranding. No nephrolithiasis. No obstructing ureteral stones. --Urinary bladder: Unremarkable. Stomach/Bowel: --Stomach/Duodenum: No hiatal hernia or other gastric abnormality. Normal duodenal course and caliber. --Small bowel: No dilatation or inflammation. --Colon:  Rectosigmoid and descending colonic diverticulosis without diverticulitis. No other focal colonic abnormality. --Appendix: Surgically absent. Lymphatic:  No abdominal or pelvic lymphadenopathy. Reproductive: Mildly enlarged prostate. Musculoskeletal. No bony spinal canal stenosis or focal osseous abnormality. Other: None. IMPRESSION: 1. 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 2. Tapered appearance of the right peroneal artery as it approaches the ankle without abrupt occlusion. Patent right dorsalis pedis. 3. Normal left three-vessel runoff to the level of the ankle. 4. 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 5.  Aortic Atherosclerosis (ICD10-I70.0). 6. Cholelithiasis  and diverticulosis without acute inflammation. 7. 7 mm right middle lobe pulmonary nodule. Non-contrast chest CT at 6-12 months is recommended. If the nodule is stable at time of repeat CT, then future CT at 18-24 months (from today's scan) is considered optional for low-risk patients, but is recommended for high-risk patients. This recommendation follows the consensus statement: Guidelines for Management of Incidental Pulmonary Nodules Detected on CT Images: From the Fleischner Society 2017; Radiology 2017; 284:228-243. Critical Value/emergent results were called by telephone at the time of interpretation on 06/21/2017 at 6:48 pm to Dr. Larae Grooms , who verbally acknowledged these results. Electronically Signed   By: Ulyses Jarred M.D.   On: 06/21/2017 18:54   US Carotid Bilateral (at [facility] Only)  Result Date: 06/22/2017 CLINICAL DATA:  CVA EXAM: BILATERAL CAROTID DUPLEX ULTRASOUND TECHNIQUE: Pearline Cables scale imaging, color Doppler and duplex ultrasound were performed of bilateral carotid and vertebral arteries in the neck. COMPARISON:   None. FINDINGS: Criteria: Quantification of carotid stenosis is based on velocity parameters that correlate the residual internal carotid diameter with NASCET-based stenosis levels, using the diameter of the distal internal carotid lumen as the denominator for stenosis measurement. The following velocity measurements were obtained: RIGHT ICA:  76 cm/sec CCA:  99 cm/sec SYSTOLIC ICA/CCA RATIO:  0.8 DIASTOLIC ICA/CCA RATIO:  1.6 ECA:  94 cm/sec LEFT ICA:  83 cm/sec CCA:  84 cm/sec SYSTOLIC ICA/CCA RATIO:  1.0 DIASTOLIC ICA/CCA RATIO:  1.5 ECA:  76 cm/sec RIGHT CAROTID ARTERY: Little if any plaque in the bulb. Low resistance internal carotid Doppler pattern. Tachycardia. RIGHT VERTEBRAL ARTERY:  Antegrade. LEFT CAROTID ARTERY: Little if any plaque in the bulb. Low resistance internal carotid Doppler pattern. LEFT VERTEBRAL ARTERY:  Antegrade. There is a large hypoechoic and heterogeneous mass in the left side of the neck measuring 2.9 x 1.4 x 1.7 cm. A pathological and enlarged lymph node is not excluded. IMPRESSION: Less than 50% stenosis in the the right and left internal carotid artery There is a hypoechoic mass in the left side of the neck worrisome for an enlarged lymph node. Malignancy is not excluded. CT neck with contrast is recommended. Electronically Signed   By: Marybelle Killings M.D.   On: 06/22/2017 10:33   Dg Chest Port 1 View  Result Date: 06/21/2017 CLINICAL DATA:  Tachycardia.  Sweating. EXAM: PORTABLE CHEST 1 VIEW COMPARISON:  None. FINDINGS: The heart size and mediastinal contours are within normal limits. Both lungs are clear. The visualized skeletal structures are unremarkable. IMPRESSION: Normal exam. Electronically Signed   By: Lorriane Shire M.D.   On: 06/21/2017 17:10     CBC Recent Labs  Lab 06/21/17 1616 06/22/17 0208  WBC 12.6* 13.5*  HGB 16.9 16.1  HCT 49.0 46.3  PLT 363 265  MCV 88.4 87.8  MCH 30.4 30.5  MCHC 34.4 34.7  RDW 13.4 13.4  LYMPHSABS 2.1  --   MONOABS 1.0  --    EOSABS 0.4  --   BASOSABS 0.1  --     Chemistries  Recent Labs  Lab 06/21/17 1616 06/22/17 0208  NA 134* 137  K 3.7 3.4*  CL 98* 103  CO2 20* 23  GLUCOSE 171* 134*  BUN 11 9  CREATININE 0.72 0.75  CALCIUM 10.2 9.4   ------------------------------------------------------------------------------------------------------------------ estimated creatinine clearance is 98.9 mL/min (by C-G formula based on SCr of 0.75 mg/dL). ------------------------------------------------------------------------------------------------------------------ No results for input(s): HGBA1C in the last 72 hours. ------------------------------------------------------------------------------------------------------------------ Recent Labs    06/22/17 0208  CHOL 145  HDL 52  LDLCALC 67  TRIG 128  CHOLHDL 2.8   ------------------------------------------------------------------------------------------------------------------ No results for input(s): TSH, T4TOTAL, T3FREE, THYROIDAB in the last 72 hours.  Invalid input(s): FREET3 ------------------------------------------------------------------------------------------------------------------ No results for input(s): VITAMINB12, FOLATE, FERRITIN, TIBC, IRON, RETICCTPCT in the last 72 hours.  Coagulation profile Recent Labs  Lab 06/21/17 1616  INR 1.05    No results for input(s): DDIMER in the last 72 hours.  Cardiac Enzymes Recent Labs  Lab 06/21/17 2113 06/22/17 0208 06/22/17 0813  TROPONINI <0.03 <0.03 <0.03   ------------------------------------------------------------------------------------------------------------------ Invalid input(s): POCBNP    Assessment & Plan   1 acute CVA with right sided hemiparesis Continue therapy with aspirin, patient also on heparin drip due to A. fib with RVR Neurology consult pending PT eval and treat  2.  Acute A. fib with RVR CHADSVasc score 5 Continue heparin for now, Cardizem drips with  tapering as tolerated, echocardiogram per above, rule out acute coronary syndrome with cardiac enzymes x3 sets, and continue close medical monitoring  3.  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  chronic benign essential hypertension Stable on current regimen, vitals per routine, and make changes as per necessary       Code Status Orders  (From admission, onward)        Start     Ordered   06/21/17 2121  Limited resuscitation (code)  Continuous    Question Answer Comment  In the event of cardiac or respiratory ARREST: Initiate Code Blue, Call Rapid Response Yes   In the event of cardiac or respiratory ARREST: Perform CPR Yes   In the event of cardiac or respiratory ARREST: Perform Intubation/Mechanical Ventilation No   In the event of cardiac or respiratory ARREST: Use NIPPV/BiPAp only if indicated Yes   In the event of cardiac or respiratory ARREST: Administer ACLS medications if indicated Yes   In the event of cardiac or respiratory ARREST: Perform Defibrillation or Cardioversion if indicated Yes      06/21/17 2120    Code Status History    Date Active Date Inactive Code Status Order ID Comments User Context   This patient has a current code status but no historical code status.           Consults  Neurolo, intersivist   DVT Prophylaxis  heparin  Lab Results  Component Value Date   PLT 265 06/22/2017     Time Spent in minutes  61min Greater than 50% of time spent in care coordination and counseling patient regarding the condition and plan of care.   Dustin Flock M.D on 06/22/2017 at 4:10 PM  Between 7am to 6pm - Pager - [phone]  After 6pm go to [email or web address] - password [email or web address] [facility]   Office  [phone]

## 2017-06-22 NOTE — Progress Notes (Signed)
*  PRELIMINARY RESULTS* Echocardiogram 2D Echocardiogram has been performed.  [name] 06/22/2017, 9:46 AM

## 2017-06-22 NOTE — Consult Note (Signed)
[facility] Vascular Consult Note  MRN : [id number]  Glenn Lott. is a 61 y.o. ([birth year]) male who presents with chief complaint of  Chief Complaint  Patient presents with  . Tachycardia  .  History of Present Illness: I am asked by Dr. Clearnce Hasten in the ER to see the patient regarding LE perfusion.  The patient is complaining of some numbness and weakness in his right arm and right leg.  This has been present over the past several days.  He was found to be in atrial fibrillation with rapid ventricular response.  He has a previous history of stroke with some residual right-sided weakness and memory difficulties as well.  He has not had any ulceration or infection of the lower extremities.  He does not describe what sounds like ischemic rest pain.  He does not dangle his foot off of bed for improvement.  No left leg or arm symptoms.  He has been seen by neurology who felt he likely had an acute embolic event creating a stroke and is being treated as such.  He is on a heparin drip.  Part of his workup included a CT angiogram which I have independently reviewed.  He has some atherosclerotic disease of the aorta without any significant stenosis.  Left iliac system has some mild stent appear to be flow-limiting.  The distal right common iliac artery and proximal right external iliac artery have what appear to be a high-grade stenosis with occlusion of the right internal iliac artery.  This has more of the appearance of a chronic lesion and not an acute and although that is not entirely excluded.  Below this, his femoral system  appears to be mildly diseased bilaterally.  He is reported to have three-vessel runoff on the left leg although this is a little difficult to discern as would be typical with a CT angiogram.  His right leg has a posterior tibial artery occlusion at the level of the ankle interpreted by the radiologist.  They report the other 2 vessels to be patent.  I  think that is a very difficult call as the timing of the contrast given the proximal stenosis would make it very difficult to interpret the distal vessels in the foot and ankle.  Even if the posterior tibial artery is occluded at the level of the ankle, that is a clinically unimportant finding with two-vessel runoff.  Current Facility-Administered Medications  Medication Dose Route Frequency Provider Last Rate Last Dose  . acetaminophen (TYLENOL) tablet 650 mg  650 mg Oral Q4H PRN Salary, Montell D, MD      . aspirin EC tablet 81 mg  81 mg Oral Daily Salary, Montell D, MD   81 mg at 06/22/17 1100  . diltiazem (CARDIZEM) tablet 60 mg  60 mg Oral Q6H Clabe Seal, PA-C   60 mg at 06/22/17 1332  . folic acid (FOLVITE) tablet 1 mg  1 mg Oral Daily Awilda Bill, NP   1 mg at 06/22/17 1100  . heparin ADULT infusion 100 units/mL (25000 units/241mL sodium chloride 0.45%)  1,600 Units/hr Intravenous Continuous Lifsey, Betti Cruz, RPH 16 mL/hr at 06/22/17 1323 1,600 Units/hr at 06/22/17 1323  . insulin aspart (novoLOG) injection 0-15 Units  0-15 Units Subcutaneous TID WC Salary, Montell D, MD   5 Units at 06/22/17 1250  . insulin aspart (novoLOG) injection 0-5 Units  0-5 Units Subcutaneous QHS Loney Hering D, MD   0 Units at 06/21/17 2150  .  LORazepam (ATIVAN) injection 2-3 mg  2-3 mg Intravenous Q1H PRN Awilda Bill, NP      . multivitamin with minerals tablet 1 tablet  1 tablet Oral Daily Awilda Bill, NP   1 tablet at 06/22/17 1100  . nicotine (NICODERM CQ - dosed in mg/24 hours) patch 21 mg  21 mg Transdermal Daily Salary, Montell D, MD   21 mg at 06/22/17 1100  . oxyCODONE (OXYCONTIN) 12 hr tablet 10 mg  10 mg Oral Q12H PRN Awilda Bill, NP      . senna-docusate (Senokot-S) tablet 1 tablet  1 tablet Oral QHS PRN Salary, Montell D, MD      . simvastatin (ZOCOR) tablet 40 mg  40 mg Oral q1800 Salary, Holly Bodily D, MD   40 mg at 06/21/17 2338  . thiamine (VITAMIN B-1) tablet 100 mg  100 mg Oral  Daily Awilda Bill, NP   100 mg at 06/22/17 1100    Past Medical History:  Diagnosis Date  . Diabetes ([place])   . History of hernia repair   . Hypertension   . Stroke [facility])     Past Surgical History:  Procedure Laterality Date  . APPENDECTOMY    . HERNIA REPAIR      Social History Social History   Tobacco Use  . Smoking status: Current Every Day Smoker    Packs/day: 0.50    Years: 30.00    Pack years: 15.00    Types: Cigarettes  . Smokeless tobacco: Never Used  Substance Use Topics  . Alcohol use: Yes    Alcohol/week: 4.2 oz    Types: 7 Cans of beer per week    Comment: occasionally  . Drug use: No    Family History Family History  Problem Relation Age of Onset  . Diabetes Father   . Hypertension Father   . Hyperlipidemia Father   No bleeding disorders, clotting disorders, or aneurysms  Allergies  Allergen Reactions  . Lipitor [Atorvastatin] Rash  . Gabapentin Other (See Comments)    Other reaction(s): Dizziness     REVIEW OF SYSTEMS (Negative unless checked)  Constitutional: [] Weight loss  [] Fever  [] Chills Cardiac: [] Chest pain   [] Chest pressure   [x] Palpitations   [] Shortness of breath when laying flat   [] Shortness of breath at rest   [] Shortness of breath with exertion. Vascular:  [] Pain in legs with walking   [x] Pain in legs at rest   [] Pain in legs when laying flat   [] Claudication   [] Pain in feet when walking  [] Pain in feet at rest  [] Pain in feet when laying flat   [] History of DVT   [] Phlebitis   [] Swelling in legs   [] Varicose veins   [] Non-healing ulcers Pulmonary:   [] Uses home oxygen   [] Productive cough   [] Hemoptysis   [] Wheeze  [] COPD   [] Asthma Neurologic:  [] Dizziness  [] Blackouts   [] Seizures   [x] History of stroke   [] History of TIA  [] Aphasia   [] Temporary blindness   [] Dysphagia   [x] Weakness or numbness in arms   [x] Weakness or numbness in legs Musculoskeletal:  [] Arthritis   [] Joint swelling   [] Joint pain   [] Low back  pain Hematologic:  [] Easy bruising  [] Easy bleeding   [] Hypercoagulable state   [] Anemic  [] Hepatitis Gastrointestinal:  [] Blood in stool   [] Vomiting blood  [] Gastroesophageal reflux/heartburn   [] Difficulty swallowing. Genitourinary:  [] Chronic kidney disease   [] Difficult urination  [] Frequent urination  [] Burning with urination   [] Blood  in urine Skin:  [] Rashes   [] Ulcers   [] Wounds Psychological:  [] History of anxiety   []  History of major depression.  Physical Examination  Vitals:   06/22/17 0700 06/22/17 0800 06/22/17 0900 06/22/17 1000  BP:  129/79  114/73  Pulse: 73 83 86   Resp: 15 20 18 16   Temp:  97.9 F (36.6 C)    TempSrc:  Oral    SpO2: 95% 97% 100%   Weight:      Height:       Body mass index is 26.98 kg/m. Gen:  WD/WN, NAD.  Appears older than stated age Head: [place]/AT, No temporalis wasting.  Ear/Nose/Throat: Hearing grossly intact, nares w/o erythema or drainage, oropharynx w/o Erythema/Exudate Eyes: Sclera non-icteric, conjunctiva clear Neck: Trachea midline.  No JVD.  Pulmonary:  Good air movement, respirations not labored, equal bilaterally.  Cardiac: RRR now that his atrial fibrillation has been controlled his rate is normal Vascular:  Vessel Right Left  Radial Palpable Palpable                      Popliteal  1+ palpable Palpable  PT  trace palpable  2+ palpable  DP  1+ palpable  1+ palpable   Gastrointestinal: soft, non-tender/non-distended.  Musculoskeletal: M/S 5/5 throughout.  Extremities without ischemic changes.  No deformity or atrophy. No edema. Neurologic: Sensation grossly intact in extremities.  Symmetrical.  Speech is fluent. Motor exam as listed above. Psychiatric: Judgment and insight appear fair.  He is a fair historian at best Dermatologic: No rashes or ulcers noted.  No cellulitis or open wounds.       CBC Lab Results  Component Value Date   WBC 13.5 (H) 06/22/2017   HGB 16.1 06/22/2017   HCT 46.3 06/22/2017   MCV 87.8  06/22/2017   PLT 265 06/22/2017    BMET    Component Value Date/Time   NA 137 06/22/2017 0208   NA 138 05/20/2017 1530   K 3.4 (L) 06/22/2017 0208   CL 103 06/22/2017 0208   CO2 23 06/22/2017 0208   GLUCOSE 134 (H) 06/22/2017 0208   BUN 9 06/22/2017 0208   BUN 9 05/20/2017 1530   CREATININE 0.75 06/22/2017 0208   CALCIUM 9.4 06/22/2017 0208   GFRNONAA >60 06/22/2017 0208   GFRAA >60 06/22/2017 0208   Estimated Creatinine Clearance: 98.9 mL/min (by C-G formula based on SCr of 0.75 mg/dL).  COAG Lab Results  Component Value Date   INR 1.05 06/21/2017    Radiology Ct Head Wo Contrast  Result Date: 06/21/2017 CLINICAL DATA:  Pt reports Right foot going numb last night, unknown as to what time. Pt also has hx of diabetes. Pt thinks numbness started around 9pm. Pt states he cannot lift his R leg at all at this time. Decreased right lower ext pulse a.*comment was truncated*^17mL ISOVUE-370 IOPAMIDOL (ISOVUE-370) INJECTION 76% EXAM: CT HEAD WITHOUT CONTRAST TECHNIQUE: Contiguous axial images were obtained from the base of the skull through the vertex without intravenous contrast. COMPARISON:  04/29/2015 FINDINGS: Brain: No acute intracranial hemorrhage. No focal mass lesion. No CT evidence of acute infarction. No midline shift or mass effect. No hydrocephalus. Basilar cisterns are patent. Remote LEFT occipital infarction with encephalomalacia. Vascular: No hyperdense vessel or unexpected calcification. Skull: Normal. Negative for fracture or focal lesion. Sinuses/Orbits: Paranasal sinuses and mastoid air cells are clear. Orbits are clear. Other: None. IMPRESSION: 1. No acute intracranial findings. 2. Remote LEFT occipital infarction. Electronically Signed  By: Suzy Bouchard M.D.   On: 06/21/2017 18:11   Ct Angio Ao+bifem W & Or Wo Contrast  Result Date: 06/21/2017 CLINICAL DATA:  Right foot numbness and cold to touch EXAM: CT ANGIOGRAPHY AOBIFEM WITHOUT AND WITH CONTRAST TECHNIQUE:  Using angiographic technique, postcontrast CT images of the abdomen and pelvis and lower extremities were obtained, from the lung bases through both feet. CONTRAST:  146mL ISOVUE-370 IOPAMIDOL (ISOVUE-370) INJECTION 76% COMPARISON:  None. FINDINGS: VASCULAR FINDINGS Aorta: Normal caliber distal thoracic and abdominal aorta. There is moderate calcified and noncalcified plaque in the infrarenal aorta without hemodynamically significant stenosis. Celiac axis:  Normal. Superior mesenteric artery: Normal. Renal arteries: Single renal arteries bilaterally. No stenosis or other abnormality. Inferior mesenteric artery:  Normal. Inflow: There is severe, predominantly noncalcified plaque within the right common iliac artery that causes severe stenosis just proximal to the right iliac bifurcation. The right external iliac artery is widely patent. There is short segment occlusion of the right internal iliac artery which reconstitutes distally. There is moderate atherosclerotic noncalcified plaque in the left common iliac artery without hemodynamically significant stenosis. Mild atherosclerosis of the left internal and external iliac arteries without hemodynamically significant stenosis. RIGHT LOWER EXTREMITY Femoral artery: Mixed calcified and noncalcified plaque at the femoral bifurcation. The femoral and deep femoral branches remain widely patent. The course of the femoral artery is normal to the level of the knee. Popliteal artery: Normal. Runoff: The right posterior tibial artery is occluded at the level of the distal tibial metaphysis. The right dorsalis pedis is patent. The right peroneal artery progressive leak tapers as it approaches the ankle without an abrupt occlusion. LEFT LOWER EXTREMITY Femoral artery: Femoral and deep femoral branches are normal. Femoral artery is normal to the level of the knee. Popliteal artery: Normal Runoff: There is normal three-vessel runoff to the level of the ankle. Review of the MIP  images confirms the above findings. NONVASCULAR FINDINGS Lower chest: 7 mm right middle lobe nodule. No pleural effusion. There is multifocal pericardial calcification. Hepatobiliary: The liver is enlarged. Cholelithiasis without acute inflammation. Pancreas: Normal contours without ductal dilatation. No peripancreatic fluid collection. Spleen: Normal. Adrenals/Urinary Tract: --Adrenal glands: Normal. --Right kidney/ureter: No hydronephrosis or perinephric stranding. No nephrolithiasis. No obstructing ureteral stones. --Left kidney/ureter: No hydronephrosis or perinephric stranding. No nephrolithiasis. No obstructing ureteral stones. --Urinary bladder: Unremarkable. Stomach/Bowel: --Stomach/Duodenum: No hiatal hernia or other gastric abnormality. Normal duodenal course and caliber. --Small bowel: No dilatation or inflammation. --Colon: Rectosigmoid and descending colonic diverticulosis without diverticulitis. No other focal colonic abnormality. --Appendix: Surgically absent. Lymphatic:  No abdominal or pelvic lymphadenopathy. Reproductive: Mildly enlarged prostate. Musculoskeletal. No bony spinal canal stenosis or focal osseous abnormality. Other: None. IMPRESSION: 1. 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 2. Tapered appearance of the right peroneal artery as it approaches the ankle without abrupt occlusion. Patent right dorsalis pedis. 3. Normal left three-vessel runoff to the level of the ankle. 4. 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 5.  Aortic Atherosclerosis (ICD10-I70.0). 6. Cholelithiasis and diverticulosis without acute inflammation. 7. 7 mm right middle lobe pulmonary nodule. Non-contrast chest CT at 6-12 months is recommended. If the nodule is stable at time of repeat CT, then future CT at 18-24  months (from today's scan) is considered optional for low-risk patients, but is recommended for high-risk patients. This recommendation follows the consensus  statement: Guidelines for Management of Incidental Pulmonary Nodules Detected on CT Images: From the Fleischner Society 2017; Radiology 2017; 284:228-243. Critical Value/emergent results were called by telephone at the time of interpretation on 06/21/2017 at 6:48 pm to Dr. Larae Grooms , who verbally acknowledged these results. Electronically Signed   By: Ulyses Jarred M.D.   On: 06/21/2017 18:54   US Carotid Bilateral (at [facility] Only)  Result Date: 06/22/2017 CLINICAL DATA:  CVA EXAM: BILATERAL CAROTID DUPLEX ULTRASOUND TECHNIQUE: Pearline Cables scale imaging, color Doppler and duplex ultrasound were performed of bilateral carotid and vertebral arteries in the neck. COMPARISON:  None. FINDINGS: Criteria: Quantification of carotid stenosis is based on velocity parameters that correlate the residual internal carotid diameter with NASCET-based stenosis levels, using the diameter of the distal internal carotid lumen as the denominator for stenosis measurement. The following velocity measurements were obtained: RIGHT ICA:  76 cm/sec CCA:  99 cm/sec SYSTOLIC ICA/CCA RATIO:  0.8 DIASTOLIC ICA/CCA RATIO:  1.6 ECA:  94 cm/sec LEFT ICA:  83 cm/sec CCA:  84 cm/sec SYSTOLIC ICA/CCA RATIO:  1.0 DIASTOLIC ICA/CCA RATIO:  1.5 ECA:  76 cm/sec RIGHT CAROTID ARTERY: Little if any plaque in the bulb. Low resistance internal carotid Doppler pattern. Tachycardia. RIGHT VERTEBRAL ARTERY:  Antegrade. LEFT CAROTID ARTERY: Little if any plaque in the bulb. Low resistance internal carotid Doppler pattern. LEFT VERTEBRAL ARTERY:  Antegrade. There is a large hypoechoic and heterogeneous mass in the left side of the neck measuring 2.9 x 1.4 x 1.7 cm. A pathological and enlarged lymph node is not excluded. IMPRESSION: Less than 50% stenosis in the the right and left internal carotid  artery There is a hypoechoic mass in the left side of the neck worrisome for an enlarged lymph node. Malignancy is not excluded. CT neck with contrast is recommended. Electronically Signed   By: Marybelle Killings M.D.   On: 06/22/2017 10:33   Dg Chest Port 1 View  Result Date: 06/21/2017 CLINICAL DATA:  Tachycardia.  Sweating. EXAM: PORTABLE CHEST 1 VIEW COMPARISON:  None. FINDINGS: The heart size and mediastinal contours are within normal limits. Both lungs are clear. The visualized skeletal structures are unremarkable. IMPRESSION: Normal exam. Electronically Signed   By: Lorriane Shire M.D.   On: 06/21/2017 17:10      Assessment/Plan 1.  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  Difficult to discern how symptomatic he is it is felt that his current right-sided symptoms are much more related to an acute stroke.  He has refused an MRI and neurology is treating him as this is an acute stroke. CT angiogram which I have independently reviewed.  He has some atherosclerotic disease of the aorta without any significant stenosis.  Left iliac system has some mild stent appear to be flow-limiting.  The distal right common iliac artery and proximal right external iliac artery have what appear to be a high-grade stenosis with occlusion of the right internal iliac artery.  This has more of the appearance of a chronic lesion and not an acute and although that is not entirely excluded.  Below this, his femoral system  appears to be mildly diseased bilaterally.  He is reported to have three-vessel runoff on the left leg although this is a little difficult to discern as would be typical with a CT angiogram.  His right leg has a posterior tibial artery occlusion at the level of the ankle interpreted by the radiologist.  They report the other 2 vessels to be patent.  I think that is a very difficult call as the timing of the contrast given the proximal stenosis would make it very difficult to interpret the  distal vessels in the foot and ankle.  Even if the posterior tibial artery is occluded at the level of the ankle, that is a clinically unimportant finding with two-vessel runoff. We have had a good discussion today about this.  I think this is likely more chronic in nature although an acute finding is possible.  His foot is warm and clearly not threatened.  He can be treated with anticoagulation currently, and consideration for an outpatient intervention would be given once he is in a better medical state.  Consider an 81 mg aspirin and coagulant he is placed on.  Follow-up with me in the office in 2-3 weeks. 2.  Atrial fibrillation with rapid ventricular response.  Better controlled now. 3.  Likely acute stroke.  Refused MRI.  Being treated with anticoagulation secondary to unexpected embolic phenomenon.  Carotid duplex showed less than 50% stenosis bilateral 4.  Diabetes.  Stable on outpatient medications and blood glucose control important in reducing the progression of atherosclerotic disease. Also, involved in wound healing. On appropriate medications.    Leotis Pain, MD  06/22/2017 3:03 PM    This note was created with Dragon medical transcription system.  Any error is purely unintentional

## 2017-06-22 NOTE — Progress Notes (Signed)
OT Cancellation Note  Patient Details Name: Sanjuan Sawa. MRN: [id number] DOB: [birth year]   Cancelled Treatment:    Reason Eval/Treat Not Completed: Other (comment). Order received, chart reviewed. New onset afibb with rvr requiring cardizem gtt and RLE DVT started on heparin gtt 5:56am 11/20. Per therapy protocol, <5 hours from initial therapeutic dose, pt contraindicated for therapy at this time. Additionally, several consults and further work up pending. Will hold this am and re-attempt OT evaluation at later date/time as medically appropriate and pending plan of care.   Jeni Salles, MPH, MS, OTR/L ascom [phone] 06/22/17, 8:03 AM

## 2017-06-22 NOTE — Progress Notes (Signed)
Patient is back in sinus rhythm.  He is not on diltiazem effusion any longer.  He is in no distress comfortable on room air.  I have placed orders for transfer to [facility] with cardiac monitoring.  After transfer, PCCM will sign off. Please call if we can be of further assistance    Merton Border, MD PCCM service Mobile [phone] Pager [phone] 06/22/2017 5:32 PM

## 2017-06-22 NOTE — Progress Notes (Signed)
Pt being transferred to room 101. Report called to [place], Therapist, sports. Pt and belongings transferred to room 101 without incident.

## 2017-06-22 NOTE — Progress Notes (Signed)
[facility] for heparin drip Indication: atrial fibrillation  Allergies  Allergen Reactions  . Lipitor [Atorvastatin] Rash  . Gabapentin Other (See Comments)    Other reaction(s): Dizziness    Patient Measurements: Height: 5\' 10"  (177.8 cm) Weight: 188 lb (85.3 kg) IBW/kg (Calculated) : 73 Heparin Dosing Weight: 85 kg  Vital Signs: Temp: 98 F (36.7 C) (11/20 1856) Temp Source: Oral (11/20 1856) BP: 133/71 (11/20 1856) Pulse Rate: 87 (11/20 1856)  Labs: Recent Labs    06/21/17 1616  06/21/17 1822 06/21/17 2113 06/22/17 0208 06/22/17 0813 06/22/17 1055 06/22/17 1921  HGB 16.9  --   --   --  16.1  --   --   --   HCT 49.0  --   --   --  46.3  --   --   --   PLT 363  --   --   --  265  --   --   --   APTT  --   --  66*  --   --   --   --   --   LABPROT 13.6  --   --   --   --   --   --   --   INR 1.05  --   --   --   --   --   --   --   HEPARINUNFRC  --   --   --   --  0.10*  --  0.24* 0.34  CREATININE 0.72  --   --   --  0.75  --   --   --   TROPONINI  --    < > <0.03 <0.03 <0.03 <0.03  --   --    < > = values in this interval not displayed.    Estimated Creatinine Clearance: 98.9 mL/min (by C-G formula based on SCr of 0.75 mg/dL).   Medical History: Past Medical History:  Diagnosis Date  . Diabetes ([place])   . History of hernia repair   . Hypertension   . Stroke [facility])     Medications:  Patient was not on any anticoagulants at home.   Assessment: 61yo male admitted with afib. Pharmacy has been consulted to dose and monitor heparin drip.  Goal of Therapy:  Heparin level 0.3-0.7 units/ml Monitor platelets by anticoagulation protocol: Yes   Plan:  HL = 0.34 is therapeutic. Continue heparin infusion at current rate of 1600 units/hr and order confirmatory HL in 6 hours.  Lenis Noon, PharmD, BCPS Clinical Pharmacist 06/22/2017

## 2017-06-22 NOTE — Consult Note (Signed)
Referring Physician: Posey Pronto    Chief Complaint: Right sided weakness  HPI: [name]. is an 61 y.o. male with a history of stroke and residual [facility] and difficulties with memory who presents with complaints of right sided weakness.  On the evening of 11/18 the patient began to note right sided weakness that at first was in the RLE.  It progressed and the patient went to see his physician on yesterday who referred him to the ED.  Patient found to be in afib with RVR.  Initial NIHSS of 6.  Date last known well: Date: 06/20/2017 Time last known well: Time: 21:00 tPA Given: No: Outside time window  Past Medical History:  Diagnosis Date  . Diabetes ([place])   . History of hernia repair   . Hypertension   . Stroke [facility])     Past Surgical History:  Procedure Laterality Date  . APPENDECTOMY    . HERNIA REPAIR      Family History  Problem Relation Age of Onset  . Diabetes Father   . Hypertension Father   . Hyperlipidemia Father    Social History:  reports that he has been smoking cigarettes.  He has a 15.00 pack-year smoking history. he has never used smokeless tobacco. He reports that he drinks about 4.2 oz of alcohol per week. He reports that he does not use drugs.  Allergies:  Allergies  Allergen Reactions  . Lipitor [Atorvastatin] Rash  . Gabapentin Other (See Comments)    Other reaction(s): Dizziness    Medications:  I have reviewed the patient's current medications. Prior to Admission:  Medications Prior to Admission  Medication Sig Dispense Refill Last Dose  . clopidogrel (PLAVIX) 75 MG tablet TAKE 1 (ONE) TABLET PO BY MOUTH DAILY 90 tablet 3 06/21/2017 at 0800  . empagliflozin (JARDIANCE) 10 MG TABS tablet Take 10 mg by mouth daily. (Patient taking differently: Take 5 mg daily by mouth. ) 90 tablet 3 06/21/2017 at 0800  . lisinopril-hydrochlorothiazide (PRINZIDE,ZESTORETIC) 10-12.5 MG tablet Take 1 tablet by mouth daily. 90 tablet 3 06/21/2017 at 0800  . metFORMIN  (GLUCOPHAGE) 1000 MG tablet Take 1 tablet (1,000 mg total) by mouth 2 (two) times daily with a meal. 180 tablet 3 06/21/2017 at 0800  . simvastatin (ZOCOR) 40 MG tablet TAKE 1 TABLET (40 MG TOTAL) BY MOUTH AT BEDTIME. 90 tablet 3 06/20/2017 at 2000  . ciclopirox (PENLAC) 8 % solution Apply over nail at Bedtime and surrounding skin. Apply daily over previous coat. After seven (7) days, may remove with alcohol and continue (Patient not taking: Reported on 06/21/2017) 6.6 mL 0 Not Taking at Unknown time  . oxyCODONE (OXYCONTIN) 10 mg 12 hr tablet Take 1 tablet (10 mg total) by mouth every 12 (twelve) hours. 20 tablet 0 prn at prn   Scheduled: . aspirin EC  81 mg Oral Daily  . diltiazem  60 mg Oral W1U  . folic acid  1 mg Oral Daily  . insulin aspart  0-15 Units Subcutaneous TID WC  . insulin aspart  0-5 Units Subcutaneous QHS  . multivitamin with minerals  1 tablet Oral Daily  . nicotine  21 mg Transdermal Daily  . simvastatin  40 mg Oral q1800  . thiamine  100 mg Oral Daily    ROS: History obtained from the patient  General ROS: negative for - chills, fatigue, fever, night sweats, weight gain or weight loss Psychological ROS: memory difficulties Ophthalmic ROS: decreased vision to the right ENT ROS: negative  for - epistaxis, nasal discharge, oral lesions, sore throat, tinnitus or vertigo Allergy and Immunology ROS: negative for - hives or itchy/watery eyes Hematological and Lymphatic ROS: negative for - bleeding problems, bruising or swollen lymph nodes Endocrine ROS: negative for - galactorrhea, hair pattern changes, polydipsia/polyuria or temperature intolerance Respiratory ROS: negative for - cough, hemoptysis, shortness of breath or wheezing Cardiovascular ROS: negative for - chest pain, dyspnea on exertion, edema or irregular heartbeat Gastrointestinal ROS: negative for - abdominal pain, diarrhea, hematemesis, nausea/vomiting or stool incontinence Genito-Urinary ROS: negative for -  dysuria, hematuria, incontinence or urinary frequency/urgency Musculoskeletal ROS: negative for - joint swelling or muscular weakness Neurological ROS: as noted in HPI Dermatological ROS: negative for rash and skin lesion changes  Physical Examination: Blood pressure 114/73, pulse 86, temperature 97.9 F (36.6 C), temperature source Oral, resp. rate 16, height 5\' 10"  (1.778 m), weight 85.3 kg (188 lb), SpO2 100 %.  HEENT-  Normocephalic, no lesions, without obvious abnormality.  Normal external eye and conjunctiva.  Normal TM's bilaterally.  Normal auditory canals and external ears. Normal external nose, mucus membranes and septum.  Normal pharynx. Cardiovascular- S1, S2 normal, pulses palpable throughout   Lungs- chest clear, no wheezing, rales, normal symmetric air entry Abdomen- soft, non-tender; bowel sounds normal; no masses,  no organomegaly Extremities- no edema Lymph-no adenopathy palpable Musculoskeletal-no joint tenderness, deformity or swelling Skin-warm and dry, no hyperpigmentation, vitiligo, or suspicious lesions  Neurological Examination   Mental Status: Alert, oriented, thought content appropriate.  Speech fluent without evidence of aphasia.  Able to follow 3 step commands without difficulty. Cranial Nerves: II: Discs flat bilaterally; RHH, pupils equal, round, reactive to light and accommodation III,IV, VI: ptosis not present, extra-ocular motions intact bilaterally V,VII: smile symmetric, facial light touch sensation normal bilaterally VIII: hearing normal bilaterally IX,X: gag reflex present XI: bilateral shoulder shrug XII: midline tongue extension Motor: Right : Upper extremity   3/5    Left:     Upper extremity   5/5  Lower extremity   0/5     Lower extremity   5/5 Tone and bulk:normal tone throughout; no atrophy noted Sensory: Pinprick and light touch intact throughout, bilaterally Deep Tendon Reflexes: 2+ and symmetric with absent AJ's  bilaterally Plantars: Right: upgoing   Left: downgoing Cerebellar: Normal finger-to-nose and normal heel-to-shin testing on the left Gait: not tested due to safety concerns    Laboratory Studies:  Basic Metabolic Panel: Recent Labs  Lab 06/21/17 1616 06/22/17 0208  NA 134* 137  K 3.7 3.4*  CL 98* 103  CO2 20* 23  GLUCOSE 171* 134*  BUN 11 9  CREATININE 0.72 0.75  CALCIUM 10.2 9.4    Liver Function Tests: No results for input(s): AST, ALT, ALKPHOS, BILITOT, PROT, ALBUMIN in the last 168 hours. No results for input(s): LIPASE, AMYLASE in the last 168 hours. No results for input(s): AMMONIA in the last 168 hours.  CBC: Recent Labs  Lab 06/21/17 1616 06/22/17 0208  WBC 12.6* 13.5*  NEUTROABS 9.4*  --   HGB 16.9 16.1  HCT 49.0 46.3  MCV 88.4 87.8  PLT 363 265    Cardiac Enzymes: Recent Labs  Lab 06/21/17 1822 06/21/17 2113 06/22/17 0208 06/22/17 0813  TROPONINI <0.03 <0.03 <0.03 <0.03    BNP: Invalid input(s): POCBNP  CBG: Recent Labs  Lab 06/21/17 1614 06/21/17 2111 06/22/17 0740 06/22/17 1148  GLUCAP 188* 161* 127* 214*    Microbiology: Results for orders placed or performed during the hospital encounter  of 06/21/17  MRSA PCR Screening     Status: None   Collection Time: 06/21/17  9:23 PM  Result Value Ref Range Status   MRSA by PCR NEGATIVE NEGATIVE Final    Comment:        The GeneXpert MRSA Assay (FDA approved for NASAL specimens only), is one component of a comprehensive MRSA colonization surveillance program. It is not intended to diagnose MRSA infection nor to guide or monitor treatment for MRSA infections.     Coagulation Studies: Recent Labs    06/21/17 1616  LABPROT 13.6  INR 1.05    Urinalysis:  Recent Labs  Lab 06/21/17 1941  COLORURINE STRAW*  LABSPEC 1.036*  PHURINE 6.0  GLUCOSEU >=500*  HGBUR NEGATIVE  BILIRUBINUR NEGATIVE  KETONESUR 5*  PROTEINUR NEGATIVE  NITRITE NEGATIVE  LEUKOCYTESUR NEGATIVE     Lipid Panel:    Component Value Date/Time   CHOL 145 06/22/2017 0208   CHOL 174 05/20/2017 1530   CHOL 209 (H) 11/02/2016 1545   TRIG 128 06/22/2017 0208   TRIG 261 (H) 11/02/2016 1545   HDL 52 06/22/2017 0208   HDL 52 05/20/2017 1530   CHOLHDL 2.8 06/22/2017 0208   VLDL 26 06/22/2017 0208   VLDL 52 (H) 11/02/2016 1545   LDLCALC 67 06/22/2017 0208   LDLCALC 85 05/20/2017 1530    HgbA1C:  Lab Results  Component Value Date   HGBA1C 7.4 08/04/2016    Urine Drug Screen:      Component Value Date/Time   LABOPIA NONE DETECTED 06/21/2017 1941   COCAINSCRNUR NONE DETECTED 06/21/2017 1941   LABBENZ NONE DETECTED 06/21/2017 1941   AMPHETMU NONE DETECTED 06/21/2017 1941   THCU NONE DETECTED 06/21/2017 1941   LABBARB NONE DETECTED 06/21/2017 1941    Alcohol Level:  Recent Labs  Lab 06/21/17 [place] <10    Other results: EKG: atrial fibrillation, rate 174 bpm.  Imaging: Ct Head Wo Contrast  Result Date: 06/21/2017 CLINICAL DATA:  Pt reports Right foot going numb last night, unknown as to what time. Pt also has hx of diabetes. Pt thinks numbness started around 9pm. Pt states he cannot lift his R leg at all at this time. Decreased right lower ext pulse a.*comment was truncated*^144mL ISOVUE-370 IOPAMIDOL (ISOVUE-370) INJECTION 76% EXAM: CT HEAD WITHOUT CONTRAST TECHNIQUE: Contiguous axial images were obtained from the base of the skull through the vertex without intravenous contrast. COMPARISON:  04/29/2015 FINDINGS: Brain: No acute intracranial hemorrhage. No focal mass lesion. No CT evidence of acute infarction. No midline shift or mass effect. No hydrocephalus. Basilar cisterns are patent. Remote LEFT occipital infarction with encephalomalacia. Vascular: No hyperdense vessel or unexpected calcification. Skull: Normal. Negative for fracture or focal lesion. Sinuses/Orbits: Paranasal sinuses and mastoid air cells are clear. Orbits are clear. Other: None. IMPRESSION: 1. No  acute intracranial findings. 2. Remote LEFT occipital infarction. Electronically Signed   By: Suzy Bouchard M.D.   On: 06/21/2017 18:11   Ct Angio Ao+bifem W & Or Wo Contrast  Result Date: 06/21/2017 CLINICAL DATA:  Right foot numbness and cold to touch EXAM: CT ANGIOGRAPHY AOBIFEM WITHOUT AND WITH CONTRAST TECHNIQUE: Using angiographic technique, postcontrast CT images of the abdomen and pelvis and lower extremities were obtained, from the lung bases through both feet. CONTRAST:  174mL ISOVUE-370 IOPAMIDOL (ISOVUE-370) INJECTION 76% COMPARISON:  None. FINDINGS: VASCULAR FINDINGS Aorta: Normal caliber distal thoracic and abdominal aorta. There is moderate calcified and noncalcified plaque in the infrarenal aorta without hemodynamically significant stenosis. Celiac  axis:  Normal. Superior mesenteric artery: Normal. Renal arteries: Single renal arteries bilaterally. No stenosis or other abnormality. Inferior mesenteric artery:  Normal. Inflow: There is severe, predominantly noncalcified plaque within the right common iliac artery that causes severe stenosis just proximal to the right iliac bifurcation. The right external iliac artery is widely patent. There is short segment occlusion of the right internal iliac artery which reconstitutes distally. There is moderate atherosclerotic noncalcified plaque in the left common iliac artery without hemodynamically significant stenosis. Mild atherosclerosis of the left internal and external iliac arteries without hemodynamically significant stenosis. RIGHT LOWER EXTREMITY Femoral artery: Mixed calcified and noncalcified plaque at the femoral bifurcation. The femoral and deep femoral branches remain widely patent. The course of the femoral artery is normal to the level of the knee. Popliteal artery: Normal. Runoff: The right posterior tibial artery is occluded at the level of the distal tibial metaphysis. The right dorsalis pedis is patent. The right peroneal artery  progressive leak tapers as it approaches the ankle without an abrupt occlusion. LEFT LOWER EXTREMITY Femoral artery: Femoral and deep femoral branches are normal. Femoral artery is normal to the level of the knee. Popliteal artery: Normal Runoff: There is normal three-vessel runoff to the level of the ankle. Review of the MIP images confirms the above findings. NONVASCULAR FINDINGS Lower chest: 7 mm right middle lobe nodule. No pleural effusion. There is multifocal pericardial calcification. Hepatobiliary: The liver is enlarged. Cholelithiasis without acute inflammation. Pancreas: Normal contours without ductal dilatation. No peripancreatic fluid collection. Spleen: Normal. Adrenals/Urinary Tract: --Adrenal glands: Normal. --Right kidney/ureter: No hydronephrosis or perinephric stranding. No nephrolithiasis. No obstructing ureteral stones. --Left kidney/ureter: No hydronephrosis or perinephric stranding. No nephrolithiasis. No obstructing ureteral stones. --Urinary bladder: Unremarkable. Stomach/Bowel: --Stomach/Duodenum: No hiatal hernia or other gastric abnormality. Normal duodenal course and caliber. --Small bowel: No dilatation or inflammation. --Colon: Rectosigmoid and descending colonic diverticulosis without diverticulitis. No other focal colonic abnormality. --Appendix: Surgically absent. Lymphatic:  No abdominal or pelvic lymphadenopathy. Reproductive: Mildly enlarged prostate. Musculoskeletal. No bony spinal canal stenosis or focal osseous abnormality. Other: None. IMPRESSION: 1. 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 2. Tapered appearance of the right peroneal artery as it approaches the ankle without abrupt occlusion. Patent right dorsalis pedis. 3. Normal left three-vessel runoff to the level of the ankle. 4. 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 5.  Aortic Atherosclerosis (ICD10-I70.0). 6. Cholelithiasis and diverticulosis without acute inflammation. 7. 7 mm right middle lobe pulmonary nodule. Non-contrast chest CT at 6-12 months is recommended. If the nodule is stable at time of repeat CT, then future CT at 18-24 months (from today's scan) is considered optional for low-risk patients, but is recommended for high-risk patients. This recommendation follows the consensus statement: Guidelines for Management of Incidental Pulmonary Nodules Detected on CT Images: From the Fleischner Society 2017; Radiology 2017; 284:228-243. Critical Value/emergent results were called by telephone at the time of interpretation on 06/21/2017 at 6:48 pm to Dr. Larae Grooms , who verbally acknowledged these results. Electronically Signed   By: Ulyses Jarred M.D.   On: 06/21/2017 18:54   US Carotid Bilateral (at [facility] Only)  Result Date: 06/22/2017 CLINICAL DATA:  CVA EXAM: BILATERAL CAROTID DUPLEX ULTRASOUND TECHNIQUE: Pearline Cables scale imaging, color Doppler and duplex ultrasound were performed of bilateral carotid and vertebral arteries in the neck. COMPARISON:  None. FINDINGS: Criteria: Quantification of carotid stenosis is based on velocity parameters that correlate the residual internal carotid diameter with NASCET-based stenosis levels, using the diameter of the distal internal carotid lumen as the denominator for stenosis measurement. The following velocity measurements were obtained: RIGHT ICA:  76 cm/sec CCA:  99 cm/sec SYSTOLIC ICA/CCA RATIO:  0.8 DIASTOLIC ICA/CCA RATIO:  1.6 ECA:  94 cm/sec LEFT ICA:  83 cm/sec CCA:  84 cm/sec SYSTOLIC ICA/CCA RATIO:  1.0 DIASTOLIC ICA/CCA RATIO:  1.5 ECA:  76 cm/sec RIGHT CAROTID ARTERY: Little if any plaque in the bulb. Low resistance internal carotid Doppler pattern. Tachycardia. RIGHT VERTEBRAL ARTERY:  Antegrade. LEFT CAROTID ARTERY: Little if any plaque in the bulb.  Low resistance internal carotid Doppler pattern. LEFT VERTEBRAL ARTERY:  Antegrade. There is a large hypoechoic and heterogeneous mass in the left side of the neck measuring 2.9 x 1.4 x 1.7 cm. A pathological and enlarged lymph node is not excluded. IMPRESSION: Less than 50% stenosis in the the right and left internal carotid artery There is a hypoechoic mass in the left side of the neck worrisome for an enlarged lymph node. Malignancy is not excluded. CT neck with contrast is recommended. Electronically Signed   By: Marybelle Killings M.D.   On: 06/22/2017 10:33   Dg Chest Port 1 View  Result Date: 06/21/2017 CLINICAL DATA:  Tachycardia.  Sweating. EXAM: PORTABLE CHEST 1 VIEW COMPARISON:  None. FINDINGS: The heart size and mediastinal contours are within normal limits. Both lungs are clear. The visualized skeletal structures are unremarkable. IMPRESSION: Normal exam. Electronically Signed   By: Lorriane Shire M.D.   On: 06/21/2017 17:10    Assessment: 61 y.o. male presenting with new onset afib and right sided weakness.  Acute embolic infarct suspected.  Head CT reviewed and shows no acute changes.  Patient has refused MRI.  On heparin.  Echocardiogram pending.  Carotid dopplers show no evidence of hemodynamically significant stenosis.  A1c 7.2.  LDL 67.    Stroke Risk Factors - diabetes mellitus, hypertension and smoking  Plan: 1. Repeat head CT  2. PT consult, OT consult, Speech consult 3. Echocardiogram pending 4. Prophylactic therapy-Patient currently on heparin.  Will require long term anticoagulation at discharge.   5. Telemetry monitoring.  Cardiology following patient. 6. Frequent neuro checks 7. Smoking cessation counseling 8. Blood sugar management with target A1c<7.0   Alexis Goodell, MD Neurology [phone] 06/22/2017, 1:44 PM

## 2017-06-22 NOTE — Progress Notes (Signed)
Patient noted to have an incontinent episode and did realize he was wet. Full linen change done. Patient is now resting. Call bell within reach.  Will continue to monitor and endorse.

## 2017-06-22 NOTE — Evaluation (Signed)
Physical Therapy Evaluation Patient Details Name: [name]. MRN: [id number] DOB: [birth year] Today's Date: 06/22/2017   History of Present Illness  Pt admitted for possible CVA. Pt compalints of tachycardia along with R UE/LE weakness. Of note, positive occulsion of R post tib artery with positive DVT, started on heparin drip this date. History includes CVA with memory deficits, DM, and HTN. Cardizem drip discontinued, cleared to work with therapy at this time.   Clinical Impression  Pt is a pleasant 61 year old male who was admitted for possible CVA. Pt performs bed mobility, transfers, and ambulation with mod assist and RW. Pt appears to have limited insight to deficits, asking when he will be stronger again and how long recovery will be. Doesn't appear to be concerned about R UE weakness. History of cognitive deficits at baseline. Pt demonstrates deficits with strength/mobility/balance. R hemibody significantly weaker compared to L side. Able to participate well and follow commands. Appears motivated to return back to independence. Great social situation with wife able to assist in care at home. Would benefit from skilled PT to address above deficits and promote optimal return to PLOF. Would be a great candidate for CIR as he needs OT/PT assistance at this time.      Follow Up Recommendations CIR    Equipment Recommendations  Rolling walker with 5" wheels    Recommendations for Other Services OT consult;Rehab consult     Precautions / Restrictions Precautions Precautions: Fall Restrictions Weight Bearing Restrictions: No      Mobility  Bed Mobility Overal bed mobility: Needs Assistance Bed Mobility: Supine to Sit     Supine to sit: Mod assist     General bed mobility comments: Needs assist for sliding out R LE towards EOB as well as assist for trunkal elevation. Able to follow commands and push with left arm. Once sitting, able to sit with upright  posture  Transfers Overall transfer level: Needs assistance Equipment used: Rolling walker (2 wheeled) Transfers: Sit to/from Stand Sit to Stand: Mod assist         General transfer comment: need assist to initiate standing and verbal cues for sequencing. Once standing, heavy WB through B UE due to R LE weakness.  Ambulation/Gait Ambulation/Gait assistance: Mod assist Ambulation Distance (Feet): 2 Feet Assistive device: Rolling walker (2 wheeled) Gait Pattern/deviations: Step-to pattern     General Gait Details: Able to side step towards [facility] with heavy cues for sequencing.  Stairs            Wheelchair Mobility    Modified Rankin (Stroke Patients Only)       Balance                                             Pertinent Vitals/Pain Pain Assessment: No/denies pain    Home Living Family/patient expects to be discharged to:: Private residence Living Arrangements: Spouse/significant other Available Help at Discharge: Family;Available 24 hours/day Type of Home: House Home Access: Stairs to enter Entrance Stairs-Rails: Can reach both Entrance Stairs-Number of Steps: 4 Home Layout: One level Home Equipment: None      Prior Function Level of Independence: Independent         Comments: reports no falls, however has been getting weaker in the past week with sudden onset of weakness on Sunday     Hand Dominance  Extremity/Trunk Assessment   Upper Extremity Assessment Upper Extremity Assessment: RUE deficits/detail(L UE grossly WNL) RUE Deficits / Details: grip 4/5; Elbow flexion/extension 3+/5 RUE Sensation: (WNL) RUE Coordination: (WNL)    Lower Extremity Assessment Lower Extremity Assessment: RLE deficits/detail(L LE grossly WNL) RLE Deficits / Details: dorsiflexion/plantarflexion 1/5; knee flexion/extension 2+/5; hip flexion 3+/5 RLE Sensation: (WNL) RLE Coordination: decreased fine motor;decreased gross motor        Communication   Communication: No difficulties  Cognition Arousal/Alertness: Awake/alert Behavior During Therapy: WFL for tasks assessed/performed Overall Cognitive Status: History of cognitive impairments - at baseline                                        General Comments      Exercises Other Exercises Other Exercises: pre gait ther-ex performed including weight shifting in standing and alt. marches. Needs assist to shift weight to L to allow R foot clearance. Decreased stance time noted on R LE during L foot clearance Other Exercises: Supine ther-ex performed x 10 reps including AAROM of ankle pumps, SLRs, and elbow flexion/extension. Verbal cues given for correct technique   Assessment/Plan    PT Assessment Patient needs continued PT services  PT Problem List Decreased strength;Decreased balance;Decreased mobility;Decreased cognition       PT Treatment Interventions DME instruction;Gait training;Stair training;Therapeutic activities;Therapeutic exercise;Balance training;Neuromuscular re-education    PT Goals (Current goals can be found in the Care Plan section)  Acute Rehab PT Goals Patient Stated Goal: to be able to walk again PT Goal Formulation: With patient Time For Goal Achievement: 07/06/17 Potential to Achieve Goals: Good    Frequency 7X/week   Barriers to discharge        Co-evaluation               AM-PAC PT "6 Clicks" Daily Activity  Outcome Measure Difficulty turning over in bed (including adjusting bedclothes, sheets and blankets)?: Unable Difficulty moving from lying on back to sitting on the side of the bed? : Unable Difficulty sitting down on and standing up from a chair with arms (e.g., wheelchair, bedside commode, etc,.)?: Unable Help needed moving to and from a bed to chair (including a wheelchair)?: A Lot Help needed walking in hospital room?: A Lot Help needed climbing 3-5 steps with a railing? : Total 6 Click Score:  8    End of Session Equipment Utilized During Treatment: Gait belt Activity Tolerance: Patient tolerated treatment well Patient left: in bed;with bed alarm set Nurse Communication: Mobility status PT Visit Diagnosis: Muscle weakness (generalized) (M62.81);Difficulty in walking, not elsewhere classified (R26.2);Unsteadiness on feet (R26.81);Hemiplegia and hemiparesis Hemiplegia - Right/Left: Right Hemiplegia - dominant/non-dominant: Dominant Hemiplegia - caused by: Unspecified    Time: 1400-1431 PT Time Calculation (min) (ACUTE ONLY): 31 min   Charges:   PT Evaluation $PT Eval Moderate Complexity: 1 Mod PT Treatments $Therapeutic Exercise: 8-22 mins   PT G Codes:   PT G-Codes **NOT FOR INPATIENT CLASS** Functional Assessment Tool Used: AM-PAC 6 Clicks Basic Mobility Functional Limitation: Mobility: Walking and moving around Mobility: Walking and Moving Around Current Status (K0938): At least 80 percent but less than 100 percent impaired, limited or restricted Mobility: Walking and Moving Around Goal Status [phone]): At least 60 percent but less than 80 percent impaired, limited or restricted    Greggory Stallion, PT, DPT [phone]   Kaylynn Chamblin 06/22/2017, 3:32 PM

## 2017-06-23 LAB — CBC
HCT: 46.5 % (ref 40.0–52.0)
HCT: 43.6 % (ref 40.0–52.0)
Hemoglobin: 15.4 g/dL (ref 13.0–18.0)
Hemoglobin: 14.8 g/dL (ref 13.0–18.0)
MCH: 29.4 pg (ref 26.0–34.0)
MCH: 30 pg (ref 26.0–34.0)
MCHC: 33.2 g/dL (ref 32.0–36.0)
MCHC: 33.9 g/dL (ref 32.0–36.0)
MCV: 88.5 fL (ref 80.0–100.0)
MCV: 88.5 fL (ref 80.0–100.0)
PLATELETS: 309 10*3/uL (ref 150–440)
PLATELETS: 307 10*3/uL (ref 150–440)
RBC: 5.25 MIL/uL (ref 4.40–5.90)
RBC: 4.92 MIL/uL (ref 4.40–5.90)
RDW: 13.4 % (ref 11.5–14.5)
RDW: 13.8 % (ref 11.5–14.5)
WBC: 11.9 10*3/uL — ABNORMAL HIGH (ref 3.8–10.6)
WBC: 12.6 10*3/uL — AB (ref 3.8–10.6)

## 2017-06-23 LAB — GLUCOSE, CAPILLARY
GLUCOSE-CAPILLARY: 142 mg/dL — AB (ref 65–99)
GLUCOSE-CAPILLARY: 215 mg/dL — AB (ref 65–99)

## 2017-06-23 LAB — HEMOGLOBIN A1C
HEMOGLOBIN A1C: 7.6 % — AB (ref 4.8–5.6)
MEAN PLASMA GLUCOSE: 171.42 mg/dL

## 2017-06-23 LAB — HEPARIN LEVEL (UNFRACTIONATED)
HEPARIN UNFRACTIONATED: 0.28 [IU]/mL — AB (ref 0.30–0.70)
HEPARIN UNFRACTIONATED: 0.61 [IU]/mL (ref 0.30–0.70)

## 2017-06-23 LAB — HIV ANTIBODY (ROUTINE TESTING W REFLEX): HIV Screen 4th Generation wRfx: NONREACTIVE

## 2017-06-23 MED ORDER — METFORMIN HCL 500 MG PO TABS
1000.0000 mg | ORAL_TABLET | Freq: Two times a day (BID) | ORAL | Status: DC
  Filled 2017-06-23: qty 2

## 2017-06-23 MED ORDER — APIXABAN 5 MG PO TABS
5.0000 mg | ORAL_TABLET | Freq: Two times a day (BID) | ORAL | Status: DC
  Administered 2017-06-23: 10:00:00 5 mg via ORAL
  Filled 2017-06-23: qty 1

## 2017-06-23 MED ORDER — ADULT MULTIVITAMIN W/MINERALS CH: 1.0000 | ORAL_TABLET | Freq: Every day | ORAL | 0 refills | Status: AC

## 2017-06-23 MED ORDER — HEPARIN BOLUS VIA INFUSION
1250.0000 [IU] | Freq: Once | INTRAVENOUS | Status: AC
  Administered 2017-06-23: 1250 [IU] via INTRAVENOUS
  Filled 2017-06-23: qty 1250

## 2017-06-23 MED ORDER — DILTIAZEM HCL ER COATED BEADS 120 MG PO CP24
240.0000 mg | ORAL_CAPSULE | Freq: Every day | ORAL | Status: DC
  Administered 2017-06-23: 240 mg via ORAL
  Filled 2017-06-23 (×2): qty 1

## 2017-06-23 MED ORDER — ASPIRIN 81 MG PO TBEC: 81.0000 mg | DELAYED_RELEASE_TABLET | Freq: Every day | ORAL | 1 refills | Status: DC

## 2017-06-23 MED ORDER — DILTIAZEM HCL ER COATED BEADS 240 MG PO CP24: 240.0000 mg | ORAL_CAPSULE | Freq: Every day | ORAL | 1 refills | Status: DC

## 2017-06-23 MED ORDER — APIXABAN 5 MG PO TABS: 5.0000 mg | ORAL_TABLET | Freq: Two times a day (BID) | ORAL | 2 refills | Status: DC

## 2017-06-23 NOTE — Care Management Note (Addendum)
Case Management Note  Patient Details  Name: [name]. MRN: [id number] Date of Birth: 1956/07/05  Subjective/Objective:    Admitted to [facility] with the diagnosis of CVA. Lives with wife, [name]. Last seen [facility] 3 days ago. Unable to state which pharmacy he uses. No home Health. No skilled Nursing. No home oxygen. States he has no medical equipment in the home. Takes care of all basic activities of daily living himself, doesn't drive. Fair appetite. No falls.                Action/Plan: Both physical and occupational therapy are recommending inpatient acute rehabilitation. Spoke with [name] at the bedside. States he doesn't want to go to any inpatient setting, but would like to go to outpatient setting. Will discuss with wife. Would like to go home with home health and physical therapy. therapist recommending [name]. [facility]ose [name]. [name] updated.   Expected Discharge Date:  06/24/17               Expected Discharge Plan:     In-House Referral:   yes  Discharge planning Services     Post Acute Care Choice:   yes Choice offered to:   wife  DME Arranged:   yes DME Agency:   [name]   HH Arranged:   yes  HH Agency:   [name]   Status of Service:     If discussed at [place] of Stay Meetings, dates discussed:    Additional Comments:  Glenn Ammons, RN MSN CCM Care Management [phone] 06/23/2017, 9:26 AM

## 2017-06-23 NOTE — Progress Notes (Signed)
OT Cancellation Note  Patient Details Name: [name]. MRN: [id number] DOB: 04-24-1956   Cancelled Treatment:    Reason Eval/Treat Not Completed: Patient at procedure or test/ unavailable. Nursing in with pt upon first attempt for blood draw. Will re-attempt this morning to evaluate.   Jeni Salles, MPH, MS, OTR/L ascom [phone] 06/23/17, 7:59 AM

## 2017-06-23 NOTE — Progress Notes (Signed)
ANTICOAGULATION CONSULT NOTE - Follow Up Consult  Pharmacy Consult for Apixaban transition from Heparin Infusion Indication: atrial fibrillation  Allergies  Allergen Reactions  . Lipitor [Atorvastatin] Rash  . Gabapentin Other (See Comments)    Other reaction(s): Dizziness    Patient Measurements: Height: 5\' 10"  (177.8 cm) Weight: 188 lb (85.3 kg) IBW/kg (Calculated) : 73 Heparin Dosing Weight:    Vital Signs: Temp: 98.6 F (37 C) (11/21 0645) Temp Source: Oral (11/21 0645) BP: 142/78 (11/21 0645) Pulse Rate: 76 (11/21 0645)  Labs: Recent Labs    06/21/17 1616  06/21/17 1822 06/21/17 2113 06/22/17 0208 06/22/17 0813  06/22/17 1921 06/23/17 0129 06/23/17 0739 06/23/17 0802  HGB 16.9  --   --   --  16.1  --   --   --  15.4 14.8  --   HCT 49.0  --   --   --  46.3  --   --   --  46.5 43.6  --   PLT 363  --   --   --  265  --   --   --  309 307  --   APTT  --   --  66*  --   --   --   --   --   --   --   --   LABPROT 13.6  --   --   --   --   --   --   --   --   --   --   INR 1.05  --   --   --   --   --   --   --   --   --   --   HEPARINUNFRC  --   --   --   --  0.10*  --    < > 0.34 0.28*  --  0.61  CREATININE 0.72  --   --   --  0.75  --   --   --   --   --   --   TROPONINI  --    < > <0.03 <0.03 <0.03 <0.03  --   --   --   --   --    < > = values in this interval not displayed.    Estimated Creatinine Clearance: 98.9 mL/min (by C-G formula based on SCr of 0.75 mg/dL).  Assessment: Patient is 61yo male admitted for acute CVA and new onset Afib. Patient has been on Heparin infusion and will be transitioned to Apixaban.   Plan:  Will stop Heparin infusion just prior to starting Apixaban 5mg  PO bid. Spoke to patient's RN and discussed plan with her. Will continue to monitor.  Paulina Fusi, PharmD, BCPS 06/23/2017 9:55 AM

## 2017-06-23 NOTE — Progress Notes (Signed)
Pt being discharged home, discharge instructions reviewed with pt and wife, states understanding, pt with no complaints, no distress or discomfort noted

## 2017-06-23 NOTE — Progress Notes (Signed)
[place] for heparin drip Indication: atrial fibrillation  Allergies  Allergen Reactions  . Lipitor [Atorvastatin] Rash  . Gabapentin Other (See Comments)    Other reaction(s): Dizziness    Patient Measurements: Height: 5\' 10"  (177.8 cm) Weight: 188 lb (85.3 kg) IBW/kg (Calculated) : 73 Heparin Dosing Weight: 85 kg  Vital Signs: Temp: 98 F (36.7 C) (11/20 1856) Temp Source: Oral (11/20 1856) BP: 140/83 (11/20 2327) Pulse Rate: 87 (11/20 2327)  Labs: Recent Labs    06/21/17 1616  06/21/17 1822 06/21/17 2113  06/22/17 0208 06/22/17 0813 06/22/17 1055 06/22/17 1921 06/23/17 0129  HGB 16.9  --   --   --   --  16.1  --   --   --   --   HCT 49.0  --   --   --   --  46.3  --   --   --   --   PLT 363  --   --   --   --  265  --   --   --   --   APTT  --   --  66*  --   --   --   --   --   --   --   LABPROT 13.6  --   --   --   --   --   --   --   --   --   INR 1.05  --   --   --   --   --   --   --   --   --   HEPARINUNFRC  --   --   --   --    < > 0.10*  --  0.24* 0.34 0.28*  CREATININE 0.72  --   --   --   --  0.75  --   --   --   --   TROPONINI  --    < > <0.03 <0.03  --  <0.03 <0.03  --   --   --    < > = values in this interval not displayed.    Estimated Creatinine Clearance: 98.9 mL/min (by C-G formula based on SCr of 0.75 mg/dL).   Medical History: Past Medical History:  Diagnosis Date  . Diabetes ([place])   . History of hernia repair   . Hypertension   . Stroke [facility])     Medications:  Patient was not on any anticoagulants at home.   Assessment: 61yo male admitted with afib. Pharmacy has been consulted to dose and monitor heparin drip.  Goal of Therapy:  Heparin level 0.3-0.7 units/ml Monitor platelets by anticoagulation protocol: Yes   Plan:  HL = 0.34 is therapeutic. Continue heparin infusion at current rate of 1600 units/hr and order confirmatory HL in 6 hours.  11/21 @ 0130 HL 0.28 subtherapeutic. Will  rebolus w/ heparin 1250 units IV x 1 and will increase rate to 1700 units/hr and will recheck anti-Xa @ 0730 w/ CBC check w/ am labs (11/21).  Tobie Lords, PharmD, BCPS Clinical Pharmacist 06/23/2017

## 2017-06-23 NOTE — Clinical Social Work Note (Signed)
CSW received referral for SNF.  Case discussed with case manager and plan is to discharge home with home health.  CSW to sign off please re-consult if social work needs arise.  Cathy Crounse R. Fionnuala Hemmerich, MSW, LCSWA [phone]  

## 2017-06-23 NOTE — Progress Notes (Signed)
Noted improved functional level with therapy today and home health is recommended. I will sign off at this time for there is not a need for an inpt rehab admission at this level. [phone]

## 2017-06-23 NOTE — Progress Notes (Signed)
Speech Therapy Note: reviewed chart notes; consulted briefly w/ NSG then met w/ pt and wife. Pt is prepping for discharge currently; pt changing clothes. Pt denied any difficulty swallowing and is currently on a regular diet; tolerates swallowing pills w/ water per NSG. Pt conversed at conversational level w/out significant deficits noted, although wife stated pt does have intermittent difficulty w/ recalling some information "especially trying to say something sometimes". Pt denied any s/s of dysarthria.  Due to pt prepping to discharge the hospital, information and education given to pt/wife on following up w/ PCP at scheduled appointment next week and requesting ST evaluation via [place] services if pt is still having difficulties recalling information he wants to say. Explained that [place] re[facility] via [facility] would work similar to receiving PT rehab. Pt and wife agreed. NSG updated.     Orinda Kenner, [place], CCC-SLP

## 2017-06-23 NOTE — Discharge Summary (Signed)
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 Diabetes ([place])   . History of hernia repair   . Hypertension   . Stroke [name])     HOSPITAL COURSE:   [name]  is a 61 y.o. male with a known history per below, history of CVA with memory deficits/chronic headache as a result, presenting with right arm/right leg weakness since 9 PM on yesterday noted by his wife, patient was seen in urgent care today and was referred to the ER for further evaluation, in the emergency room patient was found to have A. fib with RVR with heart rate in the 170s  1acute CVAwith right sided hemiparesis -Continue therapy with aspirin, patient also on heparin drip due to A. fib with RVR--now changed to po eliquis -Neurology consult appreciated. -PT recommends inpt CIR--pt wants to go home with HHPT. Wife ok with the plan.  2. Acute A. fib with RVR CHADSVasc score 5 Continue heparin --now on po eliquis  cont Cardizem CD 240 mg qd  3. 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 Pt very anxious to go home.  D/c with HHPT   CONSULTS OBTAINED:  Treatment Team:  [name], MD Alexis Goodell, MD Isaias Cowman, MD  DRUG ALLERGIES:   Allergies  Allergen Reactions  . Lipitor [Atorvastatin] Rash  . Gabapentin Other (See Comments)    Other reaction(s): Dizziness    DISCHARGE MEDICATIONS:   Current Discharge Medication List    START taking these medications   Details  apixaban (ELIQUIS) 5 MG TABS tablet Take 1 tablet (5 mg total) by mouth 2 (two) times daily. Qty: 60 tablet, Refills: 2    aspirin EC 81 MG EC tablet Take 1 tablet (81 mg total) by mouth daily. Qty: 30 tablet, Refills: 1    diltiazem (CARDIZEM CD) 240 MG 24 hr capsule Take 1 capsule (240 mg total) by mouth daily. Qty: 30 capsule, Refills: 1    Multiple Vitamin (MULTIVITAMIN WITH MINERALS) TABS tablet Take 1 tablet by mouth daily. Qty: 30 tablet, Refills: 0      CONTINUE these medications which have NOT CHANGED   Details  empagliflozin (JARDIANCE) 10 MG TABS tablet Take 10 mg by mouth daily. Qty: 90 tablet, Refills: 3    metFORMIN (GLUCOPHAGE) 1000 MG tablet Take 1 tablet (1,000 mg total) by mouth 2 (two) times daily with a meal. Qty: 180 tablet, Refills: 3  simvastatin (ZOCOR) 40 MG tablet TAKE 1 TABLET (40 MG TOTAL) BY MOUTH AT BEDTIME. Qty: 90 tablet, Refills: 3    ciclopirox (PENLAC) 8 % solution Apply over nail at Bedtime and surrounding skin. Apply daily over previous coat. After seven (7) days, may remove with alcohol and continue Qty: 6.6 mL, Refills: 0    oxyCODONE (OXYCONTIN) 10 mg 12 hr tablet Take 1 tablet (10 mg total) by mouth every 12 (twelve) hours. Qty: 20 tablet, Refills: 0      STOP taking these medications      clopidogrel (PLAVIX) 75 MG tablet      lisinopril-hydrochlorothiazide (PRINZIDE,ZESTORETIC) 10-12.5 MG tablet         If you experience worsening of your admission symptoms, develop shortness of breath, life threatening emergency, suicidal or homicidal thoughts you must seek medical attention immediately by calling 911 or calling your MD immediately  if symptoms less severe.  You Must read complete instructions/literature along with all the possible adverse reactions/side effects for all the Medicines you take and that have been prescribed to you. Take any new Medicines after you have completely understood and accept all the possible adverse reactions/side effects.   Please note  You were cared for by a hospitalist during your hospital stay. If you have any questions about your discharge medications or the care you received while you were in the hospital after you are discharged, you can call the unit and asked to speak with the hospitalist on call if the hospitalist that took care of you is not available. Once you are discharged, your primary care physician will handle any further medical issues. Please note that NO REFILLS for any discharge medications will be authorized once you are discharged, as it is imperative that you return to your primary care physician (or establish a relationship with a primary care physician if you do not have one) for your aftercare needs so that they can reassess your need for medications and monitor your lab values. Today   SUBJECTIVE  Doing overall well per PT Wife in the room   VITAL SIGNS:  Blood pressure (!) 142/78, pulse 76, temperature 98.6 F (37 C), temperature source Oral, resp. rate 18, height 5\' 10"  (1.778 m), weight 85.3 kg (188 lb), SpO2 98 %.  I/O:    Intake/Output Summary (Last 24 hours) at 06/23/2017 1217 Last data filed at 06/23/2017 0900 Gross per 24 hour  Intake 333.93 ml  Output 300 ml  Net 33.93 ml    PHYSICAL EXAMINATION:   GENERAL:  61 y.o.-year-old patient lying in the bed with no acute distress.  EYES: Pupils equal, round, reactive to light and accommodation. No scleral icterus. Extraocular muscles intact.  HEENT: Head atraumatic, normocephalic. Oropharynx and nasopharynx clear.  NECK:  Supple, no jugular venous distention. No thyroid enlargement, no tenderness.  LUNGS: Normal breath sounds bilaterally, no wheezing, rales,rhonchi or crepitation. No use of accessory muscles of respiration.  CARDIOVASCULAR: S1, S2 normal. No murmurs, rubs, or gallops.  ABDOMEN: Soft, non-tender, non-distended. Bowel sounds present. No organomegaly or mass.  EXTREMITIES: No pedal edema, cyanosis, or clubbing.  NEUROLOGIC: Cranial nerves II through XII are intact. Right Ue and LE weakness imporoving Sensation intact. Gait not checked.  PSYCHIATRIC: The patient is alert and oriented x 3.  SKIN: No obvious rash, lesion, or ulcer.   DATA REVIEW:   CBC  Recent Labs  Lab 06/23/17 0739  WBC 12.6*  HGB 14.8  HCT 43.6  PLT 307    Chemistries  Recent Labs  Lab 06/22/17 0208  NA 137  K 3.4*  CL 103  CO2 23  GLUCOSE 134*  BUN 9  CREATININE 0.75  CALCIUM 9.4    Microbiology Results   Recent Results (from the past 240 hour(s))  MRSA PCR Screening     Status: None   Collection Time: 06/21/17  9:23 PM  Result Value Ref Range Status   MRSA by PCR NEGATIVE NEGATIVE Final    Comment:        The GeneXpert MRSA Assay (FDA approved for NASAL specimens only), is one component of a comprehensive MRSA colonization surveillance program. It is not intended to diagnose MRSA infection nor to guide or monitor treatment for MRSA infections.     RADIOLOGY:  Ct Head Wo Contrast  Result Date: 06/22/2017 CLINICAL DATA:  62 y/o M; difficulties with memory and complaints of right-sided weakness. EXAM: CT HEAD WITHOUT CONTRAST TECHNIQUE: Contiguous axial images were obtained from the base of the skull through the vertex  without intravenous contrast. COMPARISON:  06/21/2017 and 04/29/2015 CT of the head. FINDINGS: Brain: Small left precentral gyrus cortical lucency (series 2, image 27) and left medial parietal cortical lucency (series 2, image 24) with increased conspicuity from prior CT of head compatible with evolving acute infarction. No acute hemorrhage, mass effect, or hydrocephalus. No extra-axial collection or effacement of basilar cisterns. Chronic left occipital infarction. Vascular: Mild calcific atherosclerosis of carotid siphons. No hyperdense vessel. Skull: Normal. Negative for fracture or focal lesion. Sinuses/Orbits: No acute finding. Other: None. IMPRESSION: 1. Small cortical infarct in left precentral gyrus and left medial parietal lobe with increased conspicuity, likely representing evolving late acute/early subacute infarctions. 2. No hemorrhage or mass effect. 3. Stable chronic left occipital lobe infarction. These results will be called to the ordering clinician or representative by the Radiologist Assistant, and communication documented in the PACS or zVision Dashboard. Electronically Signed   By: Kristine Garbe M.D.   On: 06/22/2017 15:38   Ct Head Wo Contrast  Result Date: 06/21/2017 CLINICAL DATA:  Pt reports Right foot going numb last night, unknown as to what time. Pt also has hx of diabetes. Pt thinks numbness started around 9pm. Pt states he cannot lift his R leg at all at this time. Decreased right lower ext pulse a.*comment was truncated*^160mL ISOVUE-370 IOPAMIDOL (ISOVUE-370) INJECTION 76% EXAM: CT HEAD WITHOUT CONTRAST TECHNIQUE: Contiguous axial images were obtained from the base of the skull through the vertex without intravenous contrast. COMPARISON:  04/29/2015 FINDINGS: Brain: No acute intracranial hemorrhage. No focal mass lesion. No CT evidence of acute infarction. No midline shift or mass effect. No hydrocephalus. Basilar cisterns are patent. Remote LEFT occipital infarction  with encephalomalacia. Vascular: No hyperdense vessel or unexpected calcification. Skull: Normal. Negative for fracture or focal lesion. Sinuses/Orbits: Paranasal sinuses and mastoid air cells are clear. Orbits are clear. Other: None. IMPRESSION: 1. No acute intracranial findings. 2. Remote LEFT occipital infarction. Electronically Signed   By: Suzy Bouchard M.D.   On: 06/21/2017 18:11   Ct Angio Ao+bifem W & Or Wo Contrast  Result Date: 06/21/2017 CLINICAL DATA:  Right foot numbness and cold to touch EXAM: CT ANGIOGRAPHY AOBIFEM WITHOUT AND WITH CONTRAST TECHNIQUE: Using angiographic technique, postcontrast CT images of the abdomen and pelvis and lower extremities were obtained, from the lung bases through both feet. CONTRAST:  129mL ISOVUE-370 IOPAMIDOL (ISOVUE-370) INJECTION 76% COMPARISON:  None. FINDINGS: VASCULAR FINDINGS Aorta: Normal caliber distal thoracic and abdominal aorta. There is moderate calcified and  noncalcified plaque in the infrarenal aorta without hemodynamically significant stenosis. Celiac axis:  Normal. Superior mesenteric artery: Normal. Renal arteries: Single renal arteries bilaterally. No stenosis or other abnormality. Inferior mesenteric artery:  Normal. Inflow: There is severe, predominantly noncalcified plaque within the right common iliac artery that causes severe stenosis just proximal to the right iliac bifurcation. The right external iliac artery is widely patent. There is short segment occlusion of the right internal iliac artery which reconstitutes distally. There is moderate atherosclerotic noncalcified plaque in the left common iliac artery without hemodynamically significant stenosis. Mild atherosclerosis of the left internal and external iliac arteries without hemodynamically significant stenosis. RIGHT LOWER EXTREMITY Femoral artery: Mixed calcified and noncalcified plaque at the femoral bifurcation. The femoral and deep femoral branches remain widely patent. The  course of the femoral artery is normal to the level of the knee. Popliteal artery: Normal. Runoff: The right posterior tibial artery is occluded at the level of the distal tibial metaphysis. The right dorsalis pedis is patent. The right peroneal artery progressive leak tapers as it approaches the ankle without an abrupt occlusion. LEFT LOWER EXTREMITY Femoral artery: Femoral and deep femoral branches are normal. Femoral artery is normal to the level of the knee. Popliteal artery: Normal Runoff: There is normal three-vessel runoff to the level of the ankle. Review of the MIP images confirms the above findings. NONVASCULAR FINDINGS Lower chest: 7 mm right middle lobe nodule. No pleural effusion. There is multifocal pericardial calcification. Hepatobiliary: The liver is enlarged. Cholelithiasis without acute inflammation. Pancreas: Normal contours without ductal dilatation. No peripancreatic fluid collection. Spleen: Normal. Adrenals/Urinary Tract: --Adrenal glands: Normal. --Right kidney/ureter: No hydronephrosis or perinephric stranding. No nephrolithiasis. No obstructing ureteral stones. --Left kidney/ureter: No hydronephrosis or perinephric stranding. No nephrolithiasis. No obstructing ureteral stones. --Urinary bladder: Unremarkable. Stomach/Bowel: --Stomach/Duodenum: No hiatal hernia or other gastric abnormality. Normal duodenal course and caliber. --Small bowel: No dilatation or inflammation. --Colon: Rectosigmoid and descending colonic diverticulosis without diverticulitis. No other focal colonic abnormality. --Appendix: Surgically absent. Lymphatic:  No abdominal or pelvic lymphadenopathy. Reproductive: Mildly enlarged prostate. Musculoskeletal. No bony spinal canal stenosis or focal osseous abnormality. Other: None. IMPRESSION: 1. 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 2. Tapered appearance of the right peroneal artery as it approaches the ankle without  abrupt occlusion. Patent right dorsalis pedis. 3. Normal left three-vessel runoff to the level of the ankle. 4. 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 5.  Aortic Atherosclerosis (ICD10-I70.0). 6. Cholelithiasis and diverticulosis without acute inflammation. 7. 7 mm right middle lobe pulmonary nodule. Non-contrast chest CT at 6-12 months is recommended. If the nodule is stable at time of repeat CT, then future CT at 18-24 months (from today's scan) is considered optional for low-risk patients, but is recommended for high-risk patients. This recommendation follows the consensus statement: Guidelines for Management of Incidental Pulmonary Nodules Detected on CT Images: From the Fleischner Society 2017; Radiology 2017; 284:228-243. Critical Value/emergent results were called by telephone at the time of interpretation on 06/21/2017 at 6:48 pm to Dr. Larae Grooms , who verbally acknowledged these results. Electronically Signed   By: Ulyses Jarred M.D.   On: 06/21/2017 18:54   US Carotid Bilateral (at [facility] Only)  Result Date: 06/22/2017 CLINICAL DATA:  CVA EXAM: BILATERAL CAROTID DUPLEX ULTRASOUND TECHNIQUE: Pearline Cables scale imaging, color Doppler and duplex ultrasound were  performed of bilateral carotid and vertebral arteries in the neck. COMPARISON:  None. FINDINGS: Criteria: Quantification of carotid stenosis is based on velocity parameters that correlate the residual internal carotid diameter with NASCET-based stenosis levels, using the diameter of the distal internal carotid lumen as the denominator for stenosis measurement. The following velocity measurements were obtained: RIGHT ICA:  76 cm/sec CCA:  99 cm/sec SYSTOLIC ICA/CCA RATIO:  0.8 DIASTOLIC ICA/CCA RATIO:  1.6 ECA:  94 cm/sec LEFT ICA:  83 cm/sec CCA:  84 cm/sec SYSTOLIC ICA/CCA RATIO:  1.0  DIASTOLIC ICA/CCA RATIO:  1.5 ECA:  76 cm/sec RIGHT CAROTID ARTERY: Little if any plaque in the bulb. Low resistance internal carotid Doppler pattern. Tachycardia. RIGHT VERTEBRAL ARTERY:  Antegrade. LEFT CAROTID ARTERY: Little if any plaque in the bulb. Low resistance internal carotid Doppler pattern. LEFT VERTEBRAL ARTERY:  Antegrade. There is a large hypoechoic and heterogeneous mass in the left side of the neck measuring 2.9 x 1.4 x 1.7 cm. A pathological and enlarged lymph node is not excluded. IMPRESSION: Less than 50% stenosis in the the right and left internal carotid artery There is a hypoechoic mass in the left side of the neck worrisome for an enlarged lymph node. Malignancy is not excluded. CT neck with contrast is recommended. Electronically Signed   By: Marybelle Killings M.D.   On: 06/22/2017 10:33   Dg Chest Port 1 View  Result Date: 06/21/2017 CLINICAL DATA:  Tachycardia.  Sweating. EXAM: PORTABLE CHEST 1 VIEW COMPARISON:  None. FINDINGS: The heart size and mediastinal contours are within normal limits. Both lungs are clear. The visualized skeletal structures are unremarkable. IMPRESSION: Normal exam. Electronically Signed   By: Lorriane Shire M.D.   On: 06/21/2017 17:10     Management plans discussed with the patient, family and they are in agreement.  CODE STATUS:     Code Status Orders  (From admission, onward)        Start     Ordered   06/21/17 2121  Limited resuscitation (code)  Continuous    Question Answer Comment  In the event of cardiac or respiratory ARREST: Initiate Code Blue, Call Rapid Response Yes   In the event of cardiac or respiratory ARREST: Perform CPR Yes   In the event of cardiac or respiratory ARREST: Perform Intubation/Mechanical Ventilation No   In the event of cardiac or respiratory ARREST: Use NIPPV/BiPAp only if indicated Yes   In the event of cardiac or respiratory ARREST: Administer ACLS medications if indicated Yes   In the event of cardiac or  respiratory ARREST: Perform Defibrillation or Cardioversion if indicated Yes      06/21/17 2120    Code Status History    Date Active Date Inactive Code Status Order ID Comments User Context   This patient has a current code status but no historical code status.      TOTAL TIME TAKING CARE OF THIS PATIENT: 40 minutes.    Fritzi Mandes M.D on 06/23/2017 at 12:17 PM  Between 7am to 6pm - Pager - [phone] After 6pm go to [email or web address] - password [email or web address] [facility]  Office  [phone]  CC: Primary care physician; [name], DO

## 2017-06-23 NOTE — Evaluation (Signed)
Occupational Therapy Evaluation Patient Details Name: [name]. MRN: [id number] DOB: January 20, 1956 Today's Date: 06/23/2017    History of Present Illness Pt admitted for possible CVA. Pt compalints of tachycardia along with R UE/LE weakness. Of note, positive occulsion of R post tib artery with positive DVT, started on heparin drip this date. History includes CVA with memory deficits, DM, and HTN. Cardizem drip discontinued, cleared to work with therapy at this time.    Clinical Impression   Pt is 61 year old male who presents with R sided weakness, decreased coordination with gross and fine motor skills of RUE and RLE, cognitive deficits (worse than baseline) including orientation and insight into deficits.  RUE shoulder flexion 4-/5, elbow flex/ext 4/5, grip 4+/5; intact sensation; mildly impaired coordination. Pt was able to feed self but with increased effort and time to perform with dominant R hand, utilizing L hand to support and minimize spillage. Proprioception and light touch are intact. Min assist required to complete bed mobility and transfers, min guard for ambulation with verbal cues for sequencing and safety. Generally min assist for LB ADL tasks for safety. Pt alert and oriented to self, situation, and day of the week, requiring verbal cues and additional time to process to identify correct year (initially stated it was 1989). Pt would benefit from skilled OT services to address ADL training, fine motor skills training, adaptive equipment training, strengthening, and family ed and training.  Pt would benefit from acute in-patient rehab for continued therapy after discharge from hospital.    Follow Up Recommendations  CIR    Equipment Recommendations  Other (comment)(TBD at next venue of care)    Recommendations for Other Services Rehab consult     Precautions / Restrictions Precautions Precautions: Fall Restrictions Weight Bearing Restrictions: No      Mobility Bed  Mobility Overal bed mobility: Needs Assistance Bed Mobility: Supine to Sit     Supine to sit: Min assist     General bed mobility comments: additional time, effort, min assist for RLE mgt and verbal cues to attend to RUE in order to maximize safe positioning  Transfers Overall transfer level: Needs assistance Equipment used: Rolling walker (2 wheeled) Transfers: Sit to/from Stand Sit to Stand: Min assist         General transfer comment: min assist to initiate standing and verbal cues for sequencing and safety    Balance Overall balance assessment: Needs assistance Sitting-balance support: Feet supported;Single extremity supported Sitting balance-Leahy Scale: Good     Standing balance support: Bilateral upper extremity supported Standing balance-Leahy Scale: Fair                             ADL either performed or assessed with clinical judgement   ADL Overall ADL's : Needs assistance/impaired Eating/Feeding: Sitting;Set up Eating/Feeding Details (indicate cue type and reason): decreased coordination, additional time to perform and verbal cues to compensate for strength deficits with RUE Grooming: Set up;Supervision/safety;Sitting Grooming Details (indicate cue type and reason): decreased coordination, additional time to perform and verbal cues to compensate for strength deficits with RUE Upper Body Bathing: Sitting;Min guard;Set up;Supervision/ safety   Lower Body Bathing: Sitting/lateral leans;Sit to/from stand;Minimal assistance   Upper Body Dressing : Sitting;Set up;Supervision/safety   Lower Body Dressing: Sit to/from stand;Sitting/lateral leans;Minimal assistance   Toilet Transfer: RW;Min guard;Comfort height toilet;Ambulation;Cueing for safety           Functional mobility during ADLs: Min guard;Rolling  walker;Cueing for safety       Vision Baseline Vision/History: Wears glasses(R homonymous hemianopsia from previous stroke) Wears Glasses:  At all times Patient Visual Report: No change from baseline Vision Assessment?: No apparent visual deficits Additional Comments: [place] at baseline     Perception     Praxis      Pertinent Vitals/Pain Pain Assessment: No/denies pain     Hand Dominance Right   Extremity/Trunk Assessment Upper Extremity Assessment Upper Extremity Assessment: RUE deficits/detail(LUE WFL) RUE Deficits / Details: shoulder flexion 4-/5, elbow flex/ext 4/5, grip 4+/5; intact sensation; mildly impaired coordination RUE Coordination: decreased fine motor;decreased gross motor   Lower Extremity Assessment Lower Extremity Assessment: RLE deficits/detail(LLE WFL) RLE Deficits / Details: dorsiflexion/plantarflexion 1/5; knee flexion/extension 2+/5; hip flexion 3+/5 RLE Coordination: decreased fine motor;decreased gross motor   Cervical / Trunk Assessment Cervical / Trunk Assessment: Normal   Communication Communication Communication: No difficulties   Cognition Arousal/Alertness: Awake/alert Behavior During Therapy: WFL for tasks assessed/performed Overall Cognitive Status: History of cognitive impairments - at baseline                                 General Comments: mild cognitive deficits at baseline from prior stroke, seems to be slightly worse now; able to follow all commands, alert and oriented to self, day/month, situation, disoriented 1989 but able to correct himself with verbal cue and additional time. Decreased insight into deficits.   General Comments       Exercises     Shoulder Instructions      Home Living Family/patient expects to be discharged to:: Private residence Living Arrangements: Spouse/significant other Available Help at Discharge: Family;Available 24 hours/day Type of Home: House Home Access: Stairs to enter [employer] of Steps: 4 Entrance Stairs-Rails: Can reach both Home Layout: One level     Bathroom Shower/Tub: Medical illustrator: Standard     Home Equipment: None          Prior Functioning/Environment Level of Independence: Independent        Comments: reports no falls, however has been getting weaker in the past week with sudden onset of weakness on Sunday        OT Problem List: Decreased strength;Impaired vision/perception;Decreased coordination;Decreased knowledge of precautions;Decreased cognition;Impaired UE functional use;Decreased safety awareness;Impaired balance (sitting and/or standing)      OT Treatment/Interventions: Self-care/ADL training;Therapeutic exercise;Neuromuscular education;Therapeutic activities;Cognitive remediation/compensation;DME and/or AE instruction;Patient/family education;Balance training;Visual/perceptual remediation/compensation    OT Goals(Current goals can be found in the care plan section) Acute Rehab OT Goals Patient Stated Goal: get stronger and be with wife OT Goal Formulation: With patient Time For Goal Achievement: 07/07/17 Potential to Achieve Goals: Good  OT Frequency: Min 3X/week   Barriers to D/C:            Co-evaluation              AM-PAC PT "6 Clicks" Daily Activity     Outcome Measure Help from another person eating meals?: A Little Help from another person taking care of personal grooming?: A Little Help from another person toileting, which includes using toliet, bedpan, or urinal?: A Little Help from another person bathing (including washing, rinsing, drying)?: A Little Help from another person to put on and taking off regular upper body clothing?: A Little Help from another person to put on and taking off regular lower body clothing?: A Little 6 Click Score: 18  End of Session Equipment Utilized During Treatment: Gait belt;Rolling walker  Activity Tolerance: Patient tolerated treatment well Patient left: in chair;with call bell/phone within reach;with chair alarm set;with nursing/sitter in room  OT Visit  Diagnosis: Other abnormalities of gait and mobility (R26.89);Hemiplegia and hemiparesis;Other symptoms and signs involving cognitive function Hemiplegia - Right/Left: Right Hemiplegia - dominant/non-dominant: Dominant Hemiplegia - caused by: Cerebral infarction                Time: 5520-8022 OT Time Calculation (min): 31 min Charges:  OT General Charges $OT Visit: 1 Visit OT Evaluation $OT Eval Low Complexity: 1 Low OT Treatments $Self Care/Home Management : 8-22 mins G-Codes: OT G-codes **NOT FOR INPATIENT CLASS** Functional Assessment Tool Used: AM-PAC 6 Clicks Daily Activity;Clinical judgement Functional Limitation: Self care Self Care Current Status (V3612): At least 40 percent but less than 60 percent impaired, limited or restricted Self Care Goal Status (A4497): At least 20 percent but less than 40 percent impaired, limited or restricted   Jeni Salles, MPH, MS, OTR/L ascom [phone] 06/23/17, 9:08 AM

## 2017-06-23 NOTE — Progress Notes (Signed)
Physical Therapy Treatment Patient Details Name: [name]. MRN: [id number] DOB: 01-Sep-1955 Today's Date: 06/23/2017    History of Present Illness Pt admitted for possible CVA. Pt compalints of tachycardia along with R UE/LE weakness. Of note, positive occulsion of R post tib artery with positive DVT, started on heparin drip this date. History includes CVA with memory deficits, DM, and HTN. Cardizem drip discontinued, cleared to work with therapy at this time.     PT Comments    Marked improvement in performance and overall activity tolerance, performing all mobility tasks with no greater than cga level of assist from therapist.  R hemi-body remains generally weaker with decreased coordination and speed of activation, already demonstrating some degree of learned non-use with both upper and lower extremities.  Extensive education on role of functional, forced use (with awareness of signs/symptoms of fatigue), reviewed activities to incorporate as HEP upon discharge.  Patient/wife voiced understanding. Patient did appear comfortable with use of loftstrand; requested access to one for discharge.  Recs communicated to RNCM/DC. Given noted improvement, patient discharge recommendations updated to reflect home with HHPT at discharge with transition to outpatient PT as functionally appropriate.    Follow Up Recommendations  Home health PT(with transition to outpatient PT when appropriate)     Equipment Recommendations       Recommendations for Other Services       Precautions / Restrictions Precautions Precautions: Fall Restrictions Weight Bearing Restrictions: No    Mobility  Bed Mobility               General bed mobility comments: seated in recliner beginning/end of treatment session  Transfers Overall transfer level: Needs assistance Equipment used: None Transfers: Sit to/from Stand Sit to Stand: Min guard;Supervision         General transfer comment: cuing  for foot placement, symmetrical WBing bilat LEs  Ambulation/Gait Ambulation/Gait assistance: Min guard;Supervision Ambulation Distance (Feet): 200 Feet Assistive device: None       General Gait Details: reciprocal stepping pattern with excessive R LE ER, decreased step height/length, absent heel strike (improved partially with cuing); maintains R UE in flexion synergy pattern with limited trunk rotation/arm swing.  Excessive L ant/lateral weight shift utilized to advance/clear R LE (with mild circumduction at times); limited R lateral trunk flexion throughout gait cycle   Stairs Stairs: Yes   Stair Management: One rail Left Number of Stairs: 6(x2) General stair comments: completed with step to and reciprocal stepping pattern (For therapeutic value).  Able to appropriately flex/activate R LE to clear step ahead, though requires significant effort and concentration.  Recommend step to gait pattern for use in home; patient voiced understanding.  Wheelchair Mobility    Modified Rankin (Stroke Patients Only)       Balance Overall balance assessment: Needs assistance Sitting-balance support: No upper extremity supported Sitting balance-Leahy Scale: Good     Standing balance support: No upper extremity supported Standing balance-Leahy Scale: Fair                              Cognition Arousal/Alertness: Awake/alert Behavior During Therapy: WFL for tasks assessed/performed Overall Cognitive Status: Within Functional Limits for tasks assessed                                 General Comments: mild word finding difficulties at times      Exercises  Other Exercises Other Exercises: Sit/stand x10 without UE support, cga progressing to close sup-emphasis on R LE foot placement, symmetrical WBing Other Exercises: 91' with L loftstrand, cga/close sup-improved trunk mechanics and weight shifting throughout gait cycle Other Exercises: 150' without assist  device, cga/close sup-cadence/gait speed slightly improved without use of assist device; continues with excessive L ant/lateral weight shift    General Comments        Pertinent Vitals/Pain Pain Assessment: No/denies pain    Home Living                      Prior Function            PT Goals (current goals can now be found in the care plan section) Acute Rehab PT Goals Patient Stated Goal: get stronger and be with wife PT Goal Formulation: With patient Time For Goal Achievement: 07/06/17 Potential to Achieve Goals: Good Progress towards PT goals: Progressing toward goals    Frequency           PT Plan Discharge plan needs to be updated    Co-evaluation              AM-PAC PT "6 Clicks" Daily Activity  Outcome Measure  Difficulty turning over in bed (including adjusting bedclothes, sheets and blankets)?: A Little Difficulty moving from lying on back to sitting on the side of the bed? : A Little Difficulty sitting down on and standing up from a chair with arms (e.g., wheelchair, bedside commode, etc,.)?: A Little Help needed moving to and from a bed to chair (including a wheelchair)?: A Little Help needed walking in hospital room?: A Little Help needed climbing 3-5 steps with a railing? : A Little 6 Click Score: 18    End of Session Equipment Utilized During Treatment: Gait belt Activity Tolerance: Patient tolerated treatment well Patient left: in chair;with chair alarm set;with family/visitor present Nurse Communication: Mobility status PT Visit Diagnosis: Muscle weakness (generalized) (M62.81);Difficulty in walking, not elsewhere classified (R26.2);Unsteadiness on feet (R26.81);Hemiplegia and hemiparesis Hemiplegia - Right/Left: Right Hemiplegia - dominant/non-dominant: Dominant Hemiplegia - caused by: Unspecified     Time: 4098-1191 PT Time Calculation (min) (ACUTE ONLY): 41 min  Charges:  $Gait Training: 8-22 mins $Therapeutic Activity:  8-22 mins $Neuromuscular Re-education: 8-22 mins                    G Codes:       Duan Scharnhorst H. Owens Shark, PT, DPT, NCS 06/23/17, 1:28 PM [phone]

## 2017-06-28 ENCOUNTER — Ambulatory Visit: Payer: Self-pay | Admitting: *Deleted

## 2017-06-28 NOTE — Telephone Encounter (Signed)
Received call from [facility], Physical Therapist regarding pt medications; purpose of call is to make MD's office aware and to verify that is ok for pt to take these medications together Cardizem CD 240 mg daily and Simvastatin 40 mg daily; Bhavik states that these medications have a level 1 interaction; She is scheduled to see the pt once a week for two weeks; her planned visit with him is on Wednesday 06/30/17 and would like verification prior to seeing him; will route to Dr Rance Muir pool for direction.

## 2017-06-28 NOTE — Telephone Encounter (Signed)
Please let him know it's fine to take those medicines together.

## 2017-06-28 NOTE — Telephone Encounter (Signed)
Patients's wide notified.

## 2017-07-15 ENCOUNTER — Encounter: Payer: Self-pay | Admitting: Family Medicine

## 2017-07-15 ENCOUNTER — Ambulatory Visit (INDEPENDENT_AMBULATORY_CARE_PROVIDER_SITE_OTHER): Payer: Medicare Other | Admitting: Family Medicine

## 2017-07-15 VITALS — BP 126/79 | HR 80 | Temp 98.6°F | Wt 187.5 lb

## 2017-07-15 DIAGNOSIS — I6930 Unspecified sequelae of cerebral infarction: Secondary | ICD-10-CM

## 2017-07-15 DIAGNOSIS — I4891 Unspecified atrial fibrillation: Secondary | ICD-10-CM

## 2017-07-15 DIAGNOSIS — I7090 Unspecified atherosclerosis: Secondary | ICD-10-CM | POA: Diagnosis not present

## 2017-07-15 DIAGNOSIS — Z720 Tobacco use: Secondary | ICD-10-CM | POA: Diagnosis not present

## 2017-07-15 DIAGNOSIS — I739 Peripheral vascular disease, unspecified: Secondary | ICD-10-CM

## 2017-07-15 DIAGNOSIS — I129 Hypertensive chronic kidney disease with stage 1 through stage 4 chronic kidney disease, or unspecified chronic kidney disease: Secondary | ICD-10-CM

## 2017-07-15 NOTE — Assessment & Plan Note (Signed)
Has follow up with Dr. Lucky Cowboy next week. On eliquis and aspirin. BP under good control. LDL <70 at last check. Continue to monitor.

## 2017-07-15 NOTE — Assessment & Plan Note (Signed)
Down to 10 cigs/day. Discussed vaping if needed to get off the cigarettes. Better to not have the nicotine, but can use it as a way to ease off. He and his wife are aware.

## 2017-07-15 NOTE — Assessment & Plan Note (Signed)
Will get him into PT to work on balance and strengthening his R leg. Referral generated today. On eliquis for his a fib. Will get him follow up with cardiology. Due for recheck on his labs next month. BP stable.

## 2017-07-15 NOTE — Progress Notes (Signed)
BP 126/79 (BP Location: Left Arm, Patient Position: Sitting, Cuff Size: Normal)   Pulse 80   Temp 98.6 F (37 C)   Wt 187 lb 8 oz (85 kg)   SpO2 96%   BMI 26.90 kg/m    Subjective:    Patient ID: Glenn Gerold., male    DOB: 19-Oct-1955, 61 y.o.   MRN: [id number]  HPI: Glenn Acton. is a 61 y.o. male  Chief Complaint  Patient presents with  . 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 fib with RVR--now changed to po eliquis -Neurology consult appreciated. -PT recommends inpt CIR--pt wants to go home with HHPT. [name] ok with the plan.  2. Acute A. fib with RVR CHADSVasc score 5 Continue heparin --now on po eliquis  cont Cardizem CD 240 mg qd  3. 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 Pt very anxious to go home.  Consultants: Vascular, cardiology, neurology New medications: eliquis, cardizem Current Discharge Medication List        START taking these medications   Details  apixaban (ELIQUIS) 5 MG TABS tablet Take 1 tablet (5 mg total) by mouth 2 (two) times daily. Qty: 60 tablet, Refills: 2    aspirin EC 81 MG EC tablet  Take 1 tablet (81 mg total) by mouth daily. Qty: 30 tablet, Refills: 1    diltiazem (CARDIZEM CD) 240 MG 24 hr capsule Take 1 capsule (240 mg total) by mouth daily. Qty: 30 capsule, Refills: 1    Multiple Vitamin (MULTIVITAMIN WITH MINERALS) TABS tablet Take 1 tablet by mouth daily. Qty: 30 tablet, Refills: 0          CONTINUE these medications which have NOT CHANGED   Details  empagliflozin (JARDIANCE) 10 MG TABS tablet Take 10 mg by mouth daily. Qty: 90 tablet, Refills: 3    metFORMIN (GLUCOPHAGE) 1000 MG tablet Take 1 tablet (1,000 mg total) by mouth 2 (two) times daily with a meal. Qty: 180 tablet, Refills: 3    simvastatin (ZOCOR) 40 MG tablet TAKE 1 TABLET (40 MG TOTAL) BY MOUTH AT BEDTIME. Qty: 90 tablet, Refills: 3    ciclopirox (PENLAC) 8 % solution Apply over nail at Bedtime and surrounding skin. Apply daily over previous coat. After seven (7) days, may remove with alcohol and continue Qty: 6.6 mL, Refills: 0    oxyCODONE (OXYCONTIN) 10 mg 12 hr tablet Take 1 tablet (10 mg total) by mouth every 12 (twelve) hours. Qty: 20 tablet, Refills: 0         STOP taking these medications     clopidogrel (PLAVIX) 75 MG tablet      lisinopril-hydrochlorothiazide (PRINZIDE,ZESTORETIC) 10-12.5 MG tablet          Discharge instructions:  Follow up here, with  cardiology and with Dr. Lucky Cowboy Status: better  Appointment with Dr. Lucky Cowboy next week  Able to move his arm. Having more trouble with moving his leg- hasn't done any PT. [name] thinks that he needs to go out socialize a bit and that it would be good for him to go and do his PT out. He's not sure he wants to do it, but would like his leg to do better. He is otherwise doing well with no other concerns at this time.   Relevant past medical, surgical, family and social history reviewed and updated as indicated. Interim medical history since our last visit reviewed. Allergies and medications reviewed and  updated.  Review of Systems  Constitutional: Negative for activity change, appetite change, chills, diaphoresis, fatigue, fever and unexpected weight change.  Eyes: Positive for visual disturbance. Negative for photophobia, pain, discharge, redness and itching.  Respiratory: Negative.   Cardiovascular: Negative.   Musculoskeletal: Negative.   Neurological: Positive for weakness. Negative for dizziness, tremors, seizures, syncope, facial asymmetry, speech difficulty, light-headedness, numbness and headaches.  Psychiatric/Behavioral: Negative.     Per HPI unless specifically indicated above     Objective:    BP 126/79 (BP Location: Left Arm, Patient Position: Sitting, Cuff Size: Normal)   Pulse 80   Temp 98.6 F (37 C)   Wt 187 lb 8 oz (85 kg)   SpO2 96%   BMI 26.90 kg/m   Wt Readings from Last 3 Encounters:  07/15/17 187 lb 8 oz (85 kg)  06/21/17 188 lb (85.3 kg)  05/20/17 188 lb (85.3 kg)    Physical Exam  Constitutional: He is oriented to person, place, and time. He appears well-developed and well-nourished. No distress.  HENT:  Head: Normocephalic and atraumatic.  Right Ear: Hearing normal.  Left Ear: Hearing normal.  Nose: Nose normal.  Eyes: Conjunctivae and lids are normal. Right eye exhibits no discharge. Left eye exhibits no discharge. No scleral icterus.  Cardiovascular: Normal rate, regular rhythm, normal heart sounds and intact distal pulses. Exam reveals no gallop and no friction rub.  No murmur heard. Pulmonary/Chest: Effort normal and breath sounds normal. No respiratory distress. He has no wheezes. He has no rales. He exhibits no tenderness.  Musculoskeletal: Normal range of motion.  Neurological: He is alert and oriented to person, place, and time.  Weakness of R leg  Skin: Skin is warm, dry and intact. No rash noted. He is not diaphoretic. No erythema. No pallor.  Psychiatric: He has a normal mood and affect. His speech is normal and behavior is normal.  Judgment and thought content normal. Cognition and memory are normal.  Nursing note and vitals reviewed.   Results for orders placed or performed during the hospital encounter of 06/21/17  MRSA PCR Screening  Result Value Ref Range   MRSA by PCR NEGATIVE NEGATIVE  Basic metabolic panel  Result Value Ref Range   Sodium 134 (L) 135 - 145 mmol/L   Potassium 3.7 3.5 - 5.1 mmol/L   Chloride 98 (L) 101 - 111 mmol/L   CO2 20 (L) 22 - 32 mmol/L   Glucose, Bld 171 (H) 65 - 99 mg/dL   BUN 11 6 - 20 mg/dL   Creatinine, Ser 0.72 0.61 - 1.24 mg/dL   Calcium 10.2 8.9 - 10.3 mg/dL   GFR calc non Af Amer >60 >60 mL/min   GFR calc Af Amer >60 >60 mL/min   Anion gap 16 (H) 5 - 15  CBC  Result Value Ref Range  WBC 12.6 (H) 3.8 - 10.6 K/uL   RBC 5.55 4.40 - 5.90 MIL/uL   Hemoglobin 16.9 13.0 - 18.0 g/dL   HCT 49.0 40.0 - 52.0 %   MCV 88.4 80.0 - 100.0 fL   MCH 30.4 26.0 - 34.0 pg   MCHC 34.4 32.0 - 36.0 g/dL   RDW 13.4 11.5 - 14.5 %   Platelets 363 150 - 440 K/uL  Protime-INR- (order if Patient is taking Coumadin / Warfarin)  Result Value Ref Range   Prothrombin Time 13.6 11.4 - 15.2 seconds   INR 1.05   Glucose, capillary  Result Value Ref Range   Glucose-Capillary 188 (H) 65 - 99 mg/dL   Comment 1 Notify RN   Ethanol  Result Value Ref Range   Alcohol, Ethyl (B) <10 <10 mg/dL  APTT  Result Value Ref Range   aPTT 66 (H) 24 - 36 seconds  Troponin I  Result Value Ref Range   Troponin I <0.03 <0.03 ng/mL  Urine Drug Screen, Qualitative  Result Value Ref Range   Tricyclic, Ur Screen NONE DETECTED NONE DETECTED   Amphetamines, Ur Screen NONE DETECTED NONE DETECTED   MDMA (Ecstasy)Ur Screen NONE DETECTED NONE DETECTED   Cocaine Metabolite,Ur [place] NONE DETECTED NONE DETECTED   Opiate, Ur Screen NONE DETECTED NONE DETECTED   Phencyclidine (PCP) Ur S NONE DETECTED NONE DETECTED   Cannabinoid 50 Ng, Ur  NONE DETECTED NONE DETECTED   Barbiturates, Ur Screen NONE DETECTED NONE DETECTED    Benzodiazepine, Ur Scrn NONE DETECTED NONE DETECTED   Methadone Scn, Ur NONE DETECTED NONE DETECTED  Urinalysis, Routine w reflex microscopic  Result Value Ref Range   Color, Urine STRAW (A) YELLOW   APPearance CLEAR (A) CLEAR   Specific Gravity, Urine 1.036 (H) 1.005 - 1.030   pH 6.0 5.0 - 8.0   Glucose, UA >=500 (A) NEGATIVE mg/dL   Hgb urine dipstick NEGATIVE NEGATIVE   Bilirubin Urine NEGATIVE NEGATIVE   Ketones, ur 5 (A) NEGATIVE mg/dL   Protein, ur NEGATIVE NEGATIVE mg/dL   Nitrite NEGATIVE NEGATIVE   Leukocytes, UA NEGATIVE NEGATIVE   RBC / HPF 0-5 0 - 5 RBC/hpf   WBC, UA 0-5 0 - 5 WBC/hpf   Bacteria, UA NONE SEEN NONE SEEN   Squamous Epithelial / LPF NONE SEEN NONE SEEN  Differential  Result Value Ref Range   Neutrophils Relative % 71 %   Neutro Abs 9.4 (H) 1.4 - 6.5 K/uL   Lymphocytes Relative 17 %   Lymphs Abs 2.1 1.0 - 3.6 K/uL   Monocytes Relative 8 %   Monocytes Absolute 1.0 0.2 - 1.0 K/uL   Eosinophils Relative 3 %   Eosinophils Absolute 0.4 0 - 0.7 K/uL   Basophils Relative 1 %   Basophils Absolute 0.1 0 - 0.1 K/uL  Heparin level (unfractionated)  Result Value Ref Range   Heparin Unfractionated 0.10 (L) 0.30 - 0.70 IU/mL  Basic metabolic panel  Result Value Ref Range   Sodium 137 135 - 145 mmol/L   Potassium 3.4 (L) 3.5 - 5.1 mmol/L   Chloride 103 101 - 111 mmol/L   CO2 23 22 - 32 mmol/L   Glucose, Bld 134 (H) 65 - 99 mg/dL   BUN 9 6 - 20 mg/dL   Creatinine, Ser 0.75 0.61 - 1.24 mg/dL   Calcium 9.4 8.9 - 10.3 mg/dL   GFR calc non Af Amer >60 >60 mL/min   GFR calc Af Amer >60 >60 mL/min  Anion gap 11 5 - 15  CBC  Result Value Ref Range   WBC 13.5 (H) 3.8 - 10.6 K/uL   RBC 5.28 4.40 - 5.90 MIL/uL   Hemoglobin 16.1 13.0 - 18.0 g/dL   HCT 46.3 40.0 - 52.0 %   MCV 87.8 80.0 - 100.0 fL   MCH 30.5 26.0 - 34.0 pg   MCHC 34.7 32.0 - 36.0 g/dL   RDW 13.4 11.5 - 14.5 %   Platelets 265 150 - 440 K/uL  Troponin I (q 6hr x 3)  Result Value Ref Range    Troponin I <0.03 <0.03 ng/mL  Troponin I (q 6hr x 3)  Result Value Ref Range   Troponin I <0.03 <0.03 ng/mL  Troponin I (q 6hr x 3)  Result Value Ref Range   Troponin I <0.03 <0.03 ng/mL  HIV antibody (Routine Testing)  Result Value Ref Range   HIV Screen 4th Generation wRfx Non Reactive Non Reactive  Hemoglobin A1c  Result Value Ref Range   Hgb A1c MFr Bld 7.6 (H) 4.8 - 5.6 %   Mean Plasma Glucose 171.42 mg/dL  Lipid panel  Result Value Ref Range   Cholesterol 145 0 - 200 mg/dL   Triglycerides 128 <150 mg/dL   HDL 52 >40 mg/dL   Total CHOL/HDL Ratio 2.8 RATIO   VLDL 26 0 - 40 mg/dL   LDL Cholesterol 67 0 - 99 mg/dL  Glucose, capillary  Result Value Ref Range   Glucose-Capillary 161 (H) 65 - 99 mg/dL  Heparin level (unfractionated)  Result Value Ref Range   Heparin Unfractionated 0.24 (L) 0.30 - 0.70 IU/mL  Glucose, capillary  Result Value Ref Range   Glucose-Capillary 127 (H) 65 - 99 mg/dL  Glucose, capillary  Result Value Ref Range   Glucose-Capillary 214 (H) 65 - 99 mg/dL  Heparin level (unfractionated)  Result Value Ref Range   Heparin Unfractionated 0.34 0.30 - 0.70 IU/mL  Glucose, capillary  Result Value Ref Range   Glucose-Capillary 152 (H) 65 - 99 mg/dL  Heparin level (unfractionated)  Result Value Ref Range   Heparin Unfractionated 0.28 (L) 0.30 - 0.70 IU/mL  CBC  Result Value Ref Range   WBC 11.9 (H) 3.8 - 10.6 K/uL   RBC 5.25 4.40 - 5.90 MIL/uL   Hemoglobin 15.4 13.0 - 18.0 g/dL   HCT 46.5 40.0 - 52.0 %   MCV 88.5 80.0 - 100.0 fL   MCH 29.4 26.0 - 34.0 pg   MCHC 33.2 32.0 - 36.0 g/dL   RDW 13.4 11.5 - 14.5 %   Platelets 309 150 - 440 K/uL  Glucose, capillary  Result Value Ref Range   Glucose-Capillary 127 (H) 65 - 99 mg/dL  Heparin level (unfractionated)  Result Value Ref Range   Heparin Unfractionated 0.61 0.30 - 0.70 IU/mL  CBC  Result Value Ref Range   WBC 12.6 (H) 3.8 - 10.6 K/uL   RBC 4.92 4.40 - 5.90 MIL/uL   Hemoglobin 14.8 13.0 - 18.0  g/dL   HCT 43.6 40.0 - 52.0 %   MCV 88.5 80.0 - 100.0 fL   MCH 30.0 26.0 - 34.0 pg   MCHC 33.9 32.0 - 36.0 g/dL   RDW 13.8 11.5 - 14.5 %   Platelets 307 150 - 440 K/uL  Glucose, capillary  Result Value Ref Range   Glucose-Capillary 142 (H) 65 - 99 mg/dL  Glucose, capillary  Result Value Ref Range   Glucose-Capillary 215 (H) 65 - 99 mg/dL  Comment 1 Notify RN   ECHOCARDIOGRAM COMPLETE  Result Value Ref Range   Weight 3,008 oz   Height 70 in   BP 134/71 mmHg      Assessment & Plan:   Problem List Items Addressed This Visit      Cardiovascular and Mediastinum   PVD (peripheral vascular disease) ([place])    Has follow up with Dr. Lucky Cowboy next week. On eliquis and aspirin. BP under good control. LDL <70 at last check. Continue to monitor.        Genitourinary   Benign hypertensive renal disease    Under good control today. Continue current regimen. Continue to monitor. Call with any concerns.       Relevant Orders   CBC with Differential/Platelet   Basic metabolic panel     Other   Tobacco abuse    Down to 10 cigs/day. Discussed vaping if needed to get off the cigarettes. Better to not have the nicotine, but can use it as a way to ease off. He and his [name] are aware.       Relevant Orders   CBC with Differential/Platelet   Basic metabolic panel   History of stroke with residual deficit - Primary    Will get him into PT to work on balance and strengthening his R leg. Referral generated today. On eliquis for his a fib. Will get him follow up with cardiology. Due for recheck on his labs next month. BP stable.       Relevant Orders   CBC with Differential/Platelet   Basic metabolic panel   Ambulatory referral to Physical Therapy    Other Visit Diagnoses    Atrial fibrillation, unspecified type [facility])       Relevant Orders   Ambulatory referral to Cardiology       Follow up plan: Return As scheduled.

## 2017-07-15 NOTE — Assessment & Plan Note (Signed)
Under good control today. Continue current regimen. Continue to monitor. Call with any concerns.  

## 2017-07-16 ENCOUNTER — Ambulatory Visit: Payer: Medicare Other | Attending: Family Medicine

## 2017-07-16 DIAGNOSIS — R278 Other lack of coordination: Secondary | ICD-10-CM | POA: Diagnosis not present

## 2017-07-16 DIAGNOSIS — M6281 Muscle weakness (generalized): Secondary | ICD-10-CM | POA: Diagnosis not present

## 2017-07-16 DIAGNOSIS — R2689 Other abnormalities of gait and mobility: Secondary | ICD-10-CM | POA: Insufficient documentation

## 2017-07-16 LAB — BASIC METABOLIC PANEL
BUN / CREAT RATIO: 16 (ref 10–24)
BUN: 11 mg/dL (ref 8–27)
CHLORIDE: 102 mmol/L (ref 96–106)
CO2: 24 mmol/L (ref 20–29)
Calcium: 9.7 mg/dL (ref 8.6–10.2)
Creatinine, Ser: 0.67 mg/dL — ABNORMAL LOW (ref 0.76–1.27)
GFR calc Af Amer: 119 mL/min/{1.73_m2} (ref >59)
GFR calc non Af Amer: 103 mL/min/{1.73_m2} (ref >59)
GLUCOSE: 141 mg/dL — AB (ref 65–99)
POTASSIUM: 4.5 mmol/L (ref 3.5–5.2)
SODIUM: 142 mmol/L (ref 134–144)

## 2017-07-16 LAB — CBC WITH DIFFERENTIAL/PLATELET
BASOS ABS: 0.1 10*3/uL (ref 0.0–0.2)
Basos: 1 %
EOS (ABSOLUTE): 0.7 10*3/uL — AB (ref 0.0–0.4)
Eos: 7 %
Hematocrit: 43.3 % (ref 37.5–51.0)
Hemoglobin: 14.9 g/dL (ref 13.0–17.7)
IMMATURE GRANS (ABS): 0 10*3/uL (ref 0.0–0.1)
Immature Granulocytes: 0 %
LYMPHS: 24 %
Lymphocytes Absolute: 2.4 10*3/uL (ref 0.7–3.1)
MCH: 31 pg (ref 26.6–33.0)
MCHC: 34.4 g/dL (ref 31.5–35.7)
MCV: 90 fL (ref 79–97)
Monocytes Absolute: 0.6 10*3/uL (ref 0.1–0.9)
Monocytes: 6 %
NEUTROS ABS: 6.3 10*3/uL (ref 1.4–7.0)
NEUTROS PCT: 62 %
PLATELETS: 339 10*3/uL (ref 150–379)
RBC: 4.81 x10E6/uL (ref 4.14–5.80)
RDW: 13.8 % (ref 12.3–15.4)
WBC: 10 10*3/uL (ref 3.4–10.8)

## 2017-07-16 NOTE — Therapy (Signed)
[place] MAIN [facility] SERVICES [address] [place], [place], [address] Phone: [phone]   Fax:  [phone]  Physical Therapy Evaluation  Patient Details  Name: [name]. MRN: [id number] Date of Birth: Dec 25, 1955 Referring Provider: [name], OD   Encounter Date: 07/16/2017  PT End of Session - 07/16/17 1314    Visit Number  1    Number of Visits  16    Date for PT Re-Evaluation  09/10/17    Authorization - Visit Number  1    Authorization - Number of Visits  10    PT Start Time  7494    PT Stop Time  1145    PT Time Calculation (min)  60 min    Equipment Utilized During Treatment  Gait belt    Activity Tolerance  Patient tolerated treatment well    Behavior During Therapy  WFL for tasks assessed/performed;Impulsive       Past Medical History:  Diagnosis Date  . Diabetes ([place])   . History of hernia repair   . Hypertension   . Stroke [facility] )     Past Surgical History:  Procedure Laterality Date  . APPENDECTOMY    . HERNIA REPAIR      There were no vitals filed for this visit.   Subjective Assessment - 07/16/17 0953    Subjective  Pt. is a pleasant 61 y.o male who presents to physical therapy for history of stroke with residual symptoms of weakness.     Patient is accompained by:  Family member    Pertinent History   Patient released from hospital 17 days ago. 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  Presents with weakness of R leg. History of memory deficits from stroke 2.5 years ago. 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 Has a cane but refuses to use it.     Limitations  Walking;Standing;House hold activities;Other (comment)    How long can you sit comfortably?  2-3 hours    How long can you stand comfortably?  15-20 minutes    How long can you walk comfortably?  unsteady with walking     Patient Stated Goals  get better at doing stairs, walk better, be more steady.     Currently in Pain?  Yes    Pain Score  2     Pain Location  Leg    Pain Orientation  Right    Pain Descriptors / Indicators  Aching    Pain Type  Chronic pain    Pain Onset  1 to 4 weeks ago    Pain Frequency  Intermittent    Aggravating Factors   walking longer distances, getting up from prolonged positioning, stepping over threshold    Pain Relieving Factors  lifting legs up/elevating legs     Effect of Pain on Daily Activities  limits walking and stepping/mobility              PAIN: Current: 2/10 Worst:7/10 R ankle and knee Best: 0/10 when elevating legs  POSTURE: R trunk tilt in seated position with R SB of head and L rotation due to poor vision in R eye.Decreased weight acceptance onto RLE in standing causing left hip lateral shift and R shoulder side bend  PROM/AROM:  STRENGTH:  Graded on a 0-5 scale Muscle Group Left Right  Shoulder flex  Shoulder Abd    Shoulder Ext    Shoulder IR/ER    Elbow    Wrist/hand    Hip Flex 4+/5 3-/5  Hip Abd 4/5 3-/5  Hip Add 4/5 3-/5  Hip Ext 4-/5 2+/5  Hip IR/ER    Knee Flex 4/5 3+/5  Knee Ext 4/5 2+/5  Ankle DF 4/5 2+/5  Ankle PF 4/5  2+/5   SENSATION: Slight loss of sensation RLE compared to LLE, UE no differences between   SPECIAL TESTS: Right peripheral vision lost  Follow pen well:  (occulomotor  N. functional) Coordination:  R arm drops, dysmetria  Coordination: LE's R limited by pain.    BALANCE:  Dynamic Sitting Balance  Normal Able to sit unsupported and weight shift across midline maximally   Good Able to sit unsupported and weight shift across midline moderately   Good-/Fair+ Able to sit unsupported and weight shift across midline minimally   Fair Minimal weight shifting ipsilateral/front, difficulty crossing midline x  Fair- Reach to ipsilateral side and unable to weight shift   Poor + Able to sit unsupported with min  A and reach to ipsilateral side, unable to weight shift   Poor Able to sit unsupported with mod A and reach ipsilateral/front-can't cross midline     Standing Dynamic Balance  Normal Stand independently unsupported, able to weight shift and cross midline maximally   Good Stand independently unsupported, able to weight shift and cross midline moderately   Good-/Fair+ Stand independently unsupported, able to weight shift across midline minimally   Fair Stand independently unsupported, weight shift, and reach ipsilaterally, loss of balance when crossing midline x  Poor+ Able to stand with Min A and reach ipsilaterally, unable to weight shift   Poor Able to stand with Mod A and minimally reach ipsilaterally, unable to cross midline.     Static Sitting Balance  Normal Able to maintain balance against maximal resistance   Good Able to maintain balance against moderate resistance   Good-/Fair+ Accepts minimal resistance   Fair Able to sit unsupported without balance loss and without UE support   Poor+ Able to maintain with Minimal assistance from individual or chair x  Poor Unable to maintain balance-requires mod/max support from individual or chair     Static Standing Balance  Normal Able to maintain standing balance against maximal resistance   Good Able to maintain standing balance against moderate resistance   Good-/Fair+ Able to maintain standing balance against minimal resistance   Fair Able to stand unsupported without UE support and without LOB for 1-2 min x  Fair- Requires Min A and UE support to maintain standing without loss of balance   Poor+ Requires mod A and UE support to maintain standing without loss of balance   Poor Requires max A and UE support to maintain standing balance without loss      GAIT: Reciprocal stepping pattern with excessive R LE ER, decreased step height/length; absent heel strike. Excessive L anteriorlateral weight shift utilized to clear R LE with mild  compensatory circumduction at times. Limted R lateral trunk flexion throughout gait cycle. Has no control of bladder since recent stroke,   OUTCOME MEASURES: TEST Outcome Interpretation  5 times sit<>stand 23 sec >60 yo, >15 sec indicates increased risk for falls  10 meter walk test      .9           m/s <1.0 m/s indicates increased risk for falls; limited community ambulator  LEFS 21/80 High perceived  disability   ABC 36.9% Low level of physical functioning  Berg Balance Assessment 38 <36/56 (100% risk for falls), 37-45 (80% risk for falls); 46-51 (>50% risk for falls); 52-55 (lower risk <25% of falls)        Treat: Heel raises seated 10x Toe raises seated 10x opp UE and LE reciprocal motion seated 10x  LAQ 10x      Objective measurements completed on examination: See above findings.               PT Education - 07/16/17 1314    Education provided  Yes    Education Details  HEP, need for cane in public due to balance, POC    Person(s) Educated  Patient;Spouse    Methods  Explanation;Demonstration;Handout;Verbal cues    Comprehension  Verbalized understanding;Returned demonstration;Verbal cues required;Need further instruction       PT Short Term Goals - 07/16/17 1320      PT SHORT TERM GOAL #1   Title  Patient will increase 10 meter walk test to >1.88m/s as to improve gait speed for better community ambulation and to reduce fall risk.    Baseline  12/14: .9 m/s     Time  2    Period  Weeks    Status  New    Target Date  07/30/17      PT SHORT TERM GOAL #2   Title  Patient will be independent in bending down towards floor and picking up small object (<5 pounds) and then stand back up without loss of balance as to improve ability to pick up and clean up room at home    Baseline  require supervision due to poor coordination/control of concentric and eccentric motion of squat     Time  2    Period  Weeks    Status  New    Target Date  07/30/17      PT SHORT  TERM GOAL #3   Title  Patient will tolerate 5 seconds of single leg stance without loss of balance to improve ability to get in and out of shower safely.    Baseline  12/14: unable to perform     Time  2    Period  Weeks    Status  New    Target Date  07/30/17        PT Long Term Goals - 07/16/17 1322      PT LONG TERM GOAL #1   Title  Patient will increase Berg Balance score by > 6 points (44/56) to demonstrate decreased fall risk during functional activities.    Baseline  12/14: 38/56    Time  8    Period  Weeks    Status  New    Target Date  09/10/17      PT LONG TERM GOAL #2   Title  Patient (> 56 years old) will complete five times sit to stand test in < 15 seconds indicating an increased LE strength and improved balance.    Baseline  12/14: 23 seconds    Time  8    Period  Weeks    Status  New    Target Date  09/10/17      PT LONG TERM GOAL #3   Title  Patient will increase ABC scale score >60% to demonstrate better functional mobility and better confidence with ADLs    Baseline  12/14: 36.9%    Time  8    Period  Weeks  Status  New    Target Date  09/10/17      PT LONG TERM GOAL #4   Title  Patient will increase lower extremity functional scale to >50/80 to demonstrate improved functional mobility and increased tolerance with ADLs.     Baseline  12/14: 21/80    Time  8    Period  Weeks    Status  New    Target Date  09/10/17      PT LONG TERM GOAL #5   Title  Patient will increase BLE gross strength to 4+/5 as to improve functional strength for independent gait, increased standing tolerance and increased ADL ability.    Baseline  12/14: 2+/5     Time  8    Period  Weeks    Status  New    Target Date  09/10/17             Plan - 07/16/17 1316    Clinical Impression Statement   Patient is a pleasant 61 year old male who presents to physical therapy for weakness and gait deficits secondary to history of stroke.  Admitted on 06/21/17 for Acute CVA,  cortical infarct in the left precentral gyrus and left medial parietal lobe with additional Afib with RVR and PVD.  Patient has concurrent history of memory loss and vision loss in R eye from previous stroke 2.5 years ago. Patient presents with RLE weakness, weakness, and mobility deficits accounting for abnormal gait mechanics and balance deficits. Reciprocal stepping pattern with excessive R LE ER, decreased step height/length; absent heel strike noted. Excessive L anteriorlateral weight shift utilized to clear R LE with mild compensatory circumduction at times. Limted R lateral trunk flexion throughout gait cycle. Has no control of bladder since recent stroke, 5x STS= 23 seconds, LEFS 21/80, ABC 36.9%, Berg 38/56.  Patient would benefit from skilled physical therapy to improve strength, coordination, and balance for improved gait mechanics and mobility to increase patient QOL.     History and Personal Factors relevant to plan of care:  This patient presents with  3, personal factors/ comorbidities, and, 4  body elements including body structures and functions, activity limitations and or participation restrictions. Patient's condition is unstable. ,    Clinical Presentation  Unstable    Clinical Presentation due to:  Current instability of cardiovascular system, limited vision in R eye, poor short term memory.     Clinical Decision Making  High    Rehab Potential  Fair    Clinical Impairments Affecting Rehab Potential  (-) unstable cardiac condition, limited vision in R eye, poor short term memory, (+) good family support, motivated to improve     PT Frequency  2x / week    PT Duration  8 weeks    PT Treatment/Interventions  ADLs/Self Care Home Management;Aquatic Therapy;Biofeedback;Cryotherapy;Electrical Stimulation;Traction;Ultrasound;Moist Heat;Iontophoresis 4mg /ml Dexamethasone;DME Instruction;Gait training;Stair training;Functional mobility training;Therapeutic activities;Therapeutic  exercise;Patient/family education;Orthotic Fit/Training;Neuromuscular re-education;Balance training;Manual techniques;Taping;Energy conservation;Passive range of motion;Vestibular;Visual/perceptual remediation/compensation    PT Next Visit Plan  review HEP, strengthen hip flexors, practice stepping up.     PT Home Exercise Plan  see sheet    Consulted and Agree with Plan of Care  Patient;Family member/caregiver    Family Member Consulted  wife       Patient will benefit from skilled therapeutic intervention in order to improve the following deficits and impairments:  Abnormal gait, Decreased activity tolerance, Decreased balance, Decreased knowledge of precautions, Decreased endurance, Decreased coordination, Decreased cognition, Decreased knowledge of use of  DME, Decreased mobility, Decreased range of motion, Decreased safety awareness, Difficulty walking, Decreased strength, Impaired flexibility, Impaired perceived functional ability, Impaired sensation, Impaired UE functional use, Impaired vision/preception, Postural dysfunction, Improper body mechanics, Pain  Visit Diagnosis: Muscle weakness (generalized)  Other abnormalities of gait and mobility  Other lack of coordination  G-Codes - 07-22-2017 1327    Functional Assessment Tool Used (Outpatient Only)  LEFS, ABC, BERG, 10MWT, 5x STS, clinical judgement    Functional Limitation  Mobility: Walking and moving around    Mobility: Walking and Moving Around Current Status [phone])  At least 60 percent but less than 80 percent impaired, limited or restricted    Mobility: Walking and Moving Around Goal Status [phone])  At least 20 percent but less than 40 percent impaired, limited or restricted        Problem List Patient Active Problem List   Diagnosis Date Noted  . PVD (peripheral vascular disease) ([place]) 07/15/2017  . CVA (cerebral vascular accident) ([place]) 06/21/2017  . Memory loss 09/27/2015  . 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 Diabetic peripheral neuropathy ([place]) 05/16/2015  . History of stroke with residual deficit 04/01/2015  . Chronic headaches 03/15/2015  . Tobacco abuse 01/24/2015  . Hyperlipidemia 01/21/2015  . Benign hypertensive renal disease 01/21/2015   Janna Arch, PT, DPT   Janna Arch 07/22/2017, 1:29 PM  [place] MAIN [facility] SERVICES [address] [place], [place], [address] Phone: [phone]   Fax:  [phone]  Name: [name]. MRN: [id number] Date of Birth: June 17, 1956

## 2017-07-19 ENCOUNTER — Ambulatory Visit: Payer: Medicare Other

## 2017-07-19 DIAGNOSIS — R2689 Other abnormalities of gait and mobility: Secondary | ICD-10-CM | POA: Diagnosis not present

## 2017-07-19 DIAGNOSIS — M6281 Muscle weakness (generalized): Secondary | ICD-10-CM | POA: Diagnosis not present

## 2017-07-19 DIAGNOSIS — R278 Other lack of coordination: Secondary | ICD-10-CM

## 2017-07-19 NOTE — Therapy (Signed)
[place] MAIN [facility] SERVICES [address] [place], [place], [address] Phone: [phone]   Fax:  [phone]  Physical Therapy Treatment  Patient Details  Name: [name]. MRN: [id number] Date of Birth: 25-May-1956 Referring Provider: [name], OD   Encounter Date: 07/19/2017  PT End of Session - 07/19/17 0806    Visit Number  2    Number of Visits  16    Date for PT Re-Evaluation  09/10/17    Authorization - Visit Number  2    Authorization - Number of Visits  10    PT Start Time  0800    PT Stop Time  0845    PT Time Calculation (min)  45 min    Equipment Utilized During Treatment  Gait belt    Activity Tolerance  Patient tolerated treatment well    Behavior During Therapy  [facility] for tasks assessed/performed;Impulsive       Past Medical History:  Diagnosis Date  . Diabetes ([place])   . History of hernia repair   . Hypertension   . Stroke [facility])     Past Surgical History:  Procedure Laterality Date  . APPENDECTOMY    . HERNIA REPAIR      There were no vitals filed for this visit.  Subjective Assessment - 07/19/17 0802    Subjective  Patient's wife reports patient is getting down the steps better. Did HEP on Sunday but not Saturday.     Patient is accompained by:  Family member    Pertinent History   Patient released from hospital 17 days ago. 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  Presents with weakness of R leg. History of memory deficits from stroke 2.5 years ago. 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 Has a cane but refuses to use it.     Limitations  Walking;Standing;House hold activities;Other (comment)    How long can you sit comfortably?  2-3 hours    How long can you stand comfortably?  15-20 minutes    How long can you walk comfortably?  unsteady with walking     Patient Stated Goals  get  better at doing stairs, walk better, be more steady.     Currently in Pain?  No/denies         Heel raises seated 10x  Toe raises seated 10x, correcting for RLE alignment  LAQ 10x each leg, compensatory hip flexion noted  Seated marching with OTB  20x  Seated abduction OTB 10x each leg with opp leg not moving. RLE decreased amplitude of movement  Standing high knee marches in // bars 4x length with BUE support, decreased amplutdeuw with RLE>  Reciprocal motion arms and legs seated 10x   Neuro Re-ed Airex pad: static balance 2x 60 seconds Step over and back half foam roller 10x each leg, BUE support Side step over and back half foam roller 10x each leg BUE support                         PT Education - 07/19/17 0803    Education provided  Yes    Education Details  HEP compliance    Person(s) Educated  Patient    Methods  Explanation;Demonstration;Verbal cues    Comprehension  Verbalized understanding;Returned demonstration       PT Short  Term Goals - 07/16/17 1320      PT SHORT TERM GOAL #1   Title  Patient will increase 10 meter walk test to >1.37m/s as to improve gait speed for better community ambulation and to reduce fall risk.    Baseline  12/14: .9 m/s     Time  2    Period  Weeks    Status  New    Target Date  07/30/17      PT SHORT TERM GOAL #2   Title  Patient will be independent in bending down towards floor and picking up small object (<5 pounds) and then stand back up without loss of balance as to improve ability to pick up and clean up room at home    Baseline  require supervision due to poor coordination/control of concentric and eccentric motion of squat     Time  2    Period  Weeks    Status  New    Target Date  07/30/17      PT SHORT TERM GOAL #3   Title  Patient will tolerate 5 seconds of single leg stance without loss of balance to improve ability to get in and out of shower safely.    Baseline  12/14: unable to perform     Time   2    Period  Weeks    Status  New    Target Date  07/30/17        PT Long Term Goals - 07/16/17 1322      PT LONG TERM GOAL #1   Title  Patient will increase Berg Balance score by > 6 points (44/56) to demonstrate decreased fall risk during functional activities.    Baseline  12/14: 38/56    Time  8    Period  Weeks    Status  New    Target Date  09/10/17      PT LONG TERM GOAL #2   Title  Patient (> 68 years old) will complete five times sit to stand test in < 15 seconds indicating an increased LE strength and improved balance.    Baseline  12/14: 23 seconds    Time  8    Period  Weeks    Status  New    Target Date  09/10/17      PT LONG TERM GOAL #3   Title  Patient will increase ABC scale score >60% to demonstrate better functional mobility and better confidence with ADLs    Baseline  12/14: 36.9%    Time  8    Period  Weeks    Status  New    Target Date  09/10/17      PT LONG TERM GOAL #4   Title  Patient will increase lower extremity functional scale to >50/80 to demonstrate improved functional mobility and increased tolerance with ADLs.     Baseline  12/14: 21/80    Time  8    Period  Weeks    Status  New    Target Date  09/10/17      PT LONG TERM GOAL #5   Title  Patient will increase BLE gross strength to 4+/5 as to improve functional strength for independent gait, increased standing tolerance and increased ADL ability.    Baseline  12/14: 2+/5     Time  8    Period  Weeks    Status  New    Target Date  09/10/17  Plan - 07/19/17 8466    Clinical Impression Statement  Patient presents with RLE weakness that is challenged by interventions. Patient required frequent rest breaks. Patient demonstrated understanding of HEP and will pursue a more compliant pathway. Patient challenged by lifting RLE due to weak hip flexors. Patient will continue to benefit from skilled physical therapy to improve strength, coordination, and balance for improved gait  mechanics and mobility to increase patient QOL.     Rehab Potential  Fair    Clinical Impairments Affecting Rehab Potential  (-) unstable cardiac condition, limited vision in R eye, poor short term memory, (+) good family support, motivated to improve     PT Frequency  2x / week    PT Duration  8 weeks    PT Treatment/Interventions  ADLs/Self Care Home Management;Aquatic Therapy;Biofeedback;Cryotherapy;Electrical Stimulation;Traction;Ultrasound;Moist Heat;Iontophoresis 4mg /ml Dexamethasone;DME Instruction;Gait training;Stair training;Functional mobility training;Therapeutic activities;Therapeutic exercise;Patient/family education;Orthotic Fit/Training;Neuromuscular re-education;Balance training;Manual techniques;Taping;Energy conservation;Passive range of motion;Vestibular;Visual/perceptual remediation/compensation    PT Next Visit Plan  review HEP, strengthen hip flexors, practice stepping up.     PT Home Exercise Plan  see sheet    Consulted and Agree with Plan of Care  Patient;Family member/caregiver    Family Member Consulted  wife       Patient will benefit from skilled therapeutic intervention in order to improve the following deficits and impairments:  Abnormal gait, Decreased activity tolerance, Decreased balance, Decreased knowledge of precautions, Decreased endurance, Decreased coordination, Decreased cognition, Decreased knowledge of use of DME, Decreased mobility, Decreased range of motion, Decreased safety awareness, Difficulty walking, Decreased strength, Impaired flexibility, Impaired perceived functional ability, Impaired sensation, Impaired UE functional use, Impaired vision/preception, Postural dysfunction, Improper body mechanics, Pain  Visit Diagnosis: Muscle weakness (generalized)  Other abnormalities of gait and mobility  Other lack of coordination     Problem List Patient Active Problem List   Diagnosis Date Noted  . PVD (peripheral vascular disease) ([place])  07/15/2017  . CVA (cerebral vascular accident) ([place]) 06/21/2017  . Memory loss 09/27/2015  . 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 Diabetic peripheral neuropathy ([place]) 05/16/2015  . History of stroke with residual deficit 04/01/2015  . Chronic headaches 03/15/2015  . Tobacco abuse 01/24/2015  . Hyperlipidemia 01/21/2015  . Benign hypertensive renal disease 01/21/2015   Janna Arch, PT, DPT   Janna Arch 07/19/2017, 8:43 AM  [place] MAIN [facility] SERVICES [address] [place], [place], [address] Phone: [phone]   Fax:  [phone]  Name: [name]. MRN: [id number] Date of Birth: 11-11-1955

## 2017-07-20 ENCOUNTER — Encounter (INDEPENDENT_AMBULATORY_CARE_PROVIDER_SITE_OTHER): Payer: Self-pay | Admitting: Vascular Surgery

## 2017-07-20 ENCOUNTER — Ambulatory Visit (INDEPENDENT_AMBULATORY_CARE_PROVIDER_SITE_OTHER): Payer: Medicare Other | Admitting: Vascular Surgery

## 2017-07-20 VITALS — BP 135/73 | HR 94 | Resp 17 | Wt 190.0 lb

## 2017-07-20 DIAGNOSIS — E1142 Type 2 diabetes mellitus with diabetic polyneuropathy: Secondary | ICD-10-CM | POA: Diagnosis not present

## 2017-07-20 DIAGNOSIS — I639 Cerebral infarction, unspecified: Secondary | ICD-10-CM | POA: Diagnosis not present

## 2017-07-20 DIAGNOSIS — I739 Peripheral vascular disease, unspecified: Secondary | ICD-10-CM

## 2017-07-20 DIAGNOSIS — F17210 Nicotine dependence, cigarettes, uncomplicated: Secondary | ICD-10-CM | POA: Diagnosis not present

## 2017-07-20 DIAGNOSIS — Z720 Tobacco use: Secondary | ICD-10-CM | POA: Diagnosis not present

## 2017-07-20 DIAGNOSIS — I10 Essential (primary) hypertension: Secondary | ICD-10-CM

## 2017-07-20 NOTE — Progress Notes (Signed)
MRN : [id number]  Glenn Schar. is a 61 y.o. ([birth year]) male who presents with chief complaint of  Chief Complaint  Patient presents with  . Routine Post Op    3week  .  History of Present Illness: Patient returns today in follow up of PAD.  He still has some right-sided weakness in both his arm and leg but has most of the way recovered from his recent stroke.  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  He is really not having a lot of lifestyle limiting claudication although he says his leg does give out some on him.  It is difficult to discern how much of that is from the stroke and how much is from PAD.  He does continue to smoke but has cut back.  No ulcerations or infection.  No fever or chills.  Current Outpatient Medications  Medication Sig Dispense Refill  . apixaban (ELIQUIS) 5 MG TABS tablet Take 1 tablet (5 mg total) by mouth 2 (two) times daily. 60 tablet 2  . Ascorbic Acid (VITAMIN C) 100 MG tablet Take 100 mg by mouth daily.    Marland Kitchen aspirin EC 81 MG EC tablet Take 1 tablet (81 mg total) by mouth daily. 30 tablet 1  . B Complex Vitamins (VITAMIN B COMPLEX PO) Take by mouth.    . diltiazem (CARDIZEM CD) 240 MG 24 hr capsule Take 1 capsule (240 mg total) by mouth daily. 30 capsule 1  . empagliflozin (JARDIANCE) 10 MG TABS tablet Take 10 mg by mouth daily. (Patient taking differently: Take 5 mg daily by mouth. ) 90 tablet 3  . metFORMIN (GLUCOPHAGE) 1000 MG tablet Take 1 tablet (1,000 mg total) by mouth 2 (two) times daily with a meal. 180 tablet 3  . Multiple Vitamin (MULTIVITAMIN WITH MINERALS) TABS tablet Take 1 tablet by mouth daily. 30 tablet 0  . oxyCODONE (OXYCONTIN) 10 mg 12 hr tablet Take 1 tablet (10 mg total) by mouth every 12 (twelve) hours. (Patient taking differently: Take 10 mg by mouth every 12 (twelve) hours. ) 20 tablet 0  . simvastatin (ZOCOR) 40 MG tablet TAKE 1 TABLET (40 MG TOTAL) BY MOUTH AT  BEDTIME. 90 tablet 3  . ciclopirox (PENLAC) 8 % solution Apply over nail at Bedtime and surrounding skin. Apply daily over previous coat. After seven (7) days, may remove with alcohol and continue (Patient not taking: Reported on 06/21/2017) 6.6 mL 0   No current facility-administered medications for this visit.     Past Medical History:  Diagnosis Date  . Diabetes ([place])   . History of hernia repair   . Hypertension   . Stroke [facility])     Past Surgical History:  Procedure Laterality Date  . APPENDECTOMY    . HERNIA REPAIR      Social History Social History   Tobacco Use  . Smoking status: Current Every Day Smoker    Packs/day: 0.50    Years: 30.00    Pack years: 15.00    Types: Cigarettes  . Smokeless tobacco: Never Used  Substance Use Topics  . Alcohol use: Yes    Alcohol/week: 4.2 oz    Types: 7 Cans of beer per week    Comment: occasionally  . Drug use: No    Family History Family History  Problem Relation Age of Onset  . Diabetes Father   . Hypertension Father   . Hyperlipidemia Father  Allergies  Allergen Reactions  . Lipitor [Atorvastatin] Rash  . Gabapentin Other (See Comments)    Other reaction(s): Dizziness     REVIEW OF SYSTEMS (Negative unless checked)  Constitutional: [] Weight loss  [] Fever  [] Chills Cardiac: [] Chest pain   [] Chest pressure   [] Palpitations   [] Shortness of breath when laying flat   [] Shortness of breath at rest   [] Shortness of breath with exertion. Vascular:  [x] Pain in legs with walking   [] Pain in legs at rest   [] Pain in legs when laying flat   [] Claudication   [] Pain in feet when walking  [] Pain in feet at rest  [] Pain in feet when laying flat   [] History of DVT   [] Phlebitis   [] Swelling in legs   [] Varicose veins   [] Non-healing ulcers Pulmonary:   [] Uses home oxygen   [] Productive cough   [] Hemoptysis   [] Wheeze  [] COPD   [] Asthma Neurologic:  [] Dizziness  [] Blackouts   [] Seizures   [x] History of stroke   [] History  of TIA  [] Aphasia   [] Temporary blindness   [] Dysphagia   [x] Weakness or numbness in arms   [x] Weakness or numbness in legs Musculoskeletal:  [x] Arthritis   [] Joint swelling   [] Joint pain   [] Low back pain Hematologic:  [] Easy bruising  [] Easy bleeding   [] Hypercoagulable state   [] Anemic   Gastrointestinal:  [] Blood in stool   [] Vomiting blood  [] Gastroesophageal reflux/heartburn   [] Abdominal pain Genitourinary:  [] Chronic kidney disease   [] Difficult urination  [] Frequent urination  [] Burning with urination   [] Hematuria Skin:  [] Rashes   [] Ulcers   [] Wounds Psychological:  [] History of anxiety   []  History of major depression.  Physical Examination  BP 135/73 (BP Location: Left Arm)   Pulse 94   Resp 17   Wt 86.2 kg (190 lb)   BMI 27.26 kg/m  Gen:  WD/WN, NAD Head: [place]/AT, No temporalis wasting. Ear/Nose/Throat: Hearing grossly intact, nares w/o erythema or drainage, trachea midline Eyes: Conjunctiva clear. Sclera non-icteric Neck: Supple.  No JVD.  Pulmonary:  Good air movement, no use of accessory muscles.  Cardiac: RRR, no JVD Vascular:  Vessel Right Left  Radial Palpable Palpable                          PT 1+ Palpable 1+ Palpable  DP 1+ Palpable Palpable    Musculoskeletal: M/S 5/5 throughout.  No deformity or atrophy. No LE edema. Neurologic: Sensation grossly intact in extremities.  Symmetrical.  Speech is fluent.  Psychiatric: Judgment intact, Mood & affect appropriate for pt's clinical situation. Dermatologic: No rashes or ulcers noted.  No cellulitis or open wounds.       Labs Recent Results (from the past 2160 hour(s))  Bayer DCA Hb A1c Waived     Status: Abnormal   Collection Time: 05/20/17  3:17 PM  Result Value Ref Range   Bayer DCA Hb A1c Waived 7.2 (H) <7.0 %    Comment:                                       Diabetic Adult            <7.0  Healthy Adult        4.3 - 5.7                                                            (DCCT/NGSP) American Diabetes Association's Summary of Glycemic Recommendations for Adults with Diabetes: Hemoglobin A1c <7.0%. More stringent glycemic goals (A1c <6.0%) may further reduce complications at the cost of increased risk of hypoglycemia.   Microalbumin, Urine Waived     Status: Abnormal   Collection Time: 05/20/17  3:17 PM  Result Value Ref Range   Microalb, Ur Waived 10 0 - 19 mg/L   Creatinine, Urine Waived 50 10 - 300 mg/dL   Microalb/Creat Ratio 30-300 (H) <30 mg/g    Comment:                              Abnormal:       30 - 300                         High Abnormal:           >300   UA/M w/rflx Culture, Routine     Status: Abnormal   Collection Time: 05/20/17  3:17 PM  Result Value Ref Range   Specific Gravity, UA 1.010 1.005 - 1.030   pH, UA 6.0 5.0 - 7.5   Color, UA Yellow Yellow   Appearance Ur Clear Clear   Leukocytes, UA Negative Negative   Protein, UA Negative Negative/Trace   Glucose, UA 3+ (A) Negative   Ketones, UA Negative Negative   RBC, UA Negative Negative   Bilirubin, UA Negative Negative   Urobilinogen, Ur 0.2 0.2 - 1.0 mg/dL   Nitrite, UA Negative Negative   Microscopic Examination See below:   Microscopic Examination     Status: None   Collection Time: 05/20/17  3:17 PM  Result Value Ref Range   WBC, UA 0-5 0 - 5 /hpf   RBC, UA 0-2 0 - 2 /hpf   Epithelial Cells (non renal) 0-10 0 - 10 /hpf   Bacteria, UA None seen None seen/Few  CBC with Differential/Platelet     Status: Abnormal   Collection Time: 05/20/17  3:30 PM  Result Value Ref Range   WBC 9.6 3.4 - 10.8 x10E3/uL   RBC 5.31 4.14 - 5.80 x10E6/uL   Hemoglobin 16.0 13.0 - 17.7 g/dL   Hematocrit 46.2 37.5 - 51.0 %   MCV 87 79 - 97 fL   MCH 30.1 26.6 - 33.0 pg   MCHC 34.6 31.5 - 35.7 g/dL   RDW 13.5 12.3 - 15.4 %   Platelets 297 150 - 379 x10E3/uL   Neutrophils 66 Not Estab. %   Lymphs 22 Not Estab. %   Monocytes 6 Not Estab. %   Eos 6 Not Estab. %   Basos 0  Not Estab. %   Neutrophils Absolute 6.2 1.4 - 7.0 x10E3/uL   Lymphocytes Absolute 2.1 0.7 - 3.1 x10E3/uL   Monocytes Absolute 0.6 0.1 - 0.9 x10E3/uL   EOS (ABSOLUTE) 0.6 (H) 0.0 - 0.4 x10E3/uL   Basophils Absolute 0.0 0.0 - 0.2 x10E3/uL   Immature Granulocytes 0 Not Estab. %   Immature Grans (Abs) 0.0 0.0 -  0.1 x10E3/uL  Comprehensive metabolic panel     Status: Abnormal   Collection Time: 05/20/17  3:30 PM  Result Value Ref Range   Glucose 139 (H) 65 - 99 mg/dL   BUN 9 8 - 27 mg/dL   Creatinine, Ser 0.75 (L) 0.76 - 1.27 mg/dL   GFR calc non Af Amer 98 >59 mL/min/1.73   GFR calc Af Amer 114 >59 mL/min/1.73   BUN/Creatinine Ratio 12 10 - 24   Sodium 138 134 - 144 mmol/L   Potassium 4.7 3.5 - 5.2 mmol/L   Chloride 96 96 - 106 mmol/L   CO2 26 20 - 29 mmol/L   Calcium 10.6 (H) 8.6 - 10.2 mg/dL   Total Protein 7.5 6.0 - 8.5 g/dL   Albumin 4.9 (H) 3.6 - 4.8 g/dL   Globulin, Total 2.6 1.5 - 4.5 g/dL   Albumin/Globulin Ratio 1.9 1.2 - 2.2   Bilirubin Total 0.5 0.0 - 1.2 mg/dL   Alkaline Phosphatase 90 39 - 117 IU/L   AST 29 0 - 40 IU/L   ALT 36 0 - 44 IU/L  Lipid Panel w/o Chol/HDL Ratio     Status: Abnormal   Collection Time: 05/20/17  3:30 PM  Result Value Ref Range   Cholesterol, Total 174 100 - 199 mg/dL   Triglycerides 185 (H) 0 - 149 mg/dL   HDL 52 >39 mg/dL   VLDL Cholesterol Cal 37 5 - 40 mg/dL   LDL Calculated 85 0 - 99 mg/dL  PSA     Status: None   Collection Time: 05/20/17  3:30 PM  Result Value Ref Range   Prostate Specific Ag, Serum 0.5 0.0 - 4.0 ng/mL    Comment: Roche ECLIA methodology. According to the American Urological Association, Serum PSA should decrease and remain at undetectable levels after radical prostatectomy. The AUA defines biochemical recurrence as an initial PSA value 0.2 ng/mL or greater followed by a subsequent confirmatory PSA value 0.2 ng/mL or greater. Values obtained with different assay methods or kits cannot be used interchangeably.  Results cannot be interpreted as absolute evidence of the presence or absence of malignant disease.   TSH     Status: None   Collection Time: 05/20/17  3:30 PM  Result Value Ref Range   TSH 1.960 0.450 - 4.500 uIU/mL  Glucose, capillary     Status: Abnormal   Collection Time: 06/21/17  4:14 PM  Result Value Ref Range   Glucose-Capillary 188 (H) 65 - 99 mg/dL   Comment 1 Notify RN   Basic metabolic panel     Status: Abnormal   Collection Time: 06/21/17  4:16 PM  Result Value Ref Range   Sodium 134 (L) 135 - 145 mmol/L   Potassium 3.7 3.5 - 5.1 mmol/L   Chloride 98 (L) 101 - 111 mmol/L   CO2 20 (L) 22 - 32 mmol/L   Glucose, Bld 171 (H) 65 - 99 mg/dL   BUN 11 6 - 20 mg/dL   Creatinine, Ser 0.72 0.61 - 1.24 mg/dL   Calcium 10.2 8.9 - 10.3 mg/dL   GFR calc non Af Amer >60 >60 mL/min   GFR calc Af Amer >60 >60 mL/min    Comment: (NOTE) The eGFR has been calculated using the CKD EPI equation. This calculation has not been validated in all clinical situations. eGFR's persistently <60 mL/min signify possible Chronic Kidney Disease.    Anion gap 16 (H) 5 - 15  CBC     Status: Abnormal  Collection Time: 06/21/17  4:16 PM  Result Value Ref Range   WBC 12.6 (H) 3.8 - 10.6 K/uL   RBC 5.55 4.40 - 5.90 MIL/uL   Hemoglobin 16.9 13.0 - 18.0 g/dL   HCT 49.0 40.0 - 52.0 %   MCV 88.4 80.0 - 100.0 fL   MCH 30.4 26.0 - 34.0 pg   MCHC 34.4 32.0 - 36.0 g/dL   RDW 13.4 11.5 - 14.5 %   Platelets 363 150 - 440 K/uL    Comment: PLATELET COUNT CONFIRMED BY SMEAR LARGE PLATELETS PRESENT   Protime-INR- (order if Patient is taking Coumadin / Warfarin)     Status: None   Collection Time: 06/21/17  4:16 PM  Result Value Ref Range   Prothrombin Time 13.6 11.4 - 15.2 seconds   INR 1.05   Differential     Status: Abnormal   Collection Time: 06/21/17  4:16 PM  Result Value Ref Range   Neutrophils Relative % 71 %   Neutro Abs 9.4 (H) 1.4 - 6.5 K/uL   Lymphocytes Relative 17 %   Lymphs Abs 2.1 1.0  - 3.6 K/uL   Monocytes Relative 8 %   Monocytes Absolute 1.0 0.2 - 1.0 K/uL   Eosinophils Relative 3 %   Eosinophils Absolute 0.4 0 - 0.7 K/uL   Basophils Relative 1 %   Basophils Absolute 0.1 0 - 0.1 K/uL  Ethanol     Status: None   Collection Time: 06/21/17  6:22 PM  Result Value Ref Range   Alcohol, Ethyl (B) <10 <10 mg/dL    Comment:        LOWEST DETECTABLE LIMIT FOR SERUM ALCOHOL IS 10 mg/dL FOR MEDICAL PURPOSES ONLY   APTT     Status: Abnormal   Collection Time: 06/21/17  6:22 PM  Result Value Ref Range   aPTT 66 (H) 24 - 36 seconds    Comment:        IF BASELINE aPTT IS ELEVATED, SUGGEST PATIENT RISK ASSESSMENT BE USED TO DETERMINE APPROPRIATE ANTICOAGULANT THERAPY.   Troponin I     Status: None   Collection Time: 06/21/17  6:22 PM  Result Value Ref Range   Troponin I <0.03 <0.03 ng/mL  Urine Drug Screen, Qualitative     Status: None   Collection Time: 06/21/17  7:41 PM  Result Value Ref Range   Tricyclic, Ur Screen NONE DETECTED NONE DETECTED   Amphetamines, Ur Screen NONE DETECTED NONE DETECTED   MDMA (Ecstasy)Ur Screen NONE DETECTED NONE DETECTED   Cocaine Metabolite,Ur [place] NONE DETECTED NONE DETECTED   Opiate, Ur Screen NONE DETECTED NONE DETECTED   Phencyclidine (PCP) Ur S NONE DETECTED NONE DETECTED   Cannabinoid 50 Ng, Ur [place] NONE DETECTED NONE DETECTED   Barbiturates, Ur Screen NONE DETECTED NONE DETECTED   Benzodiazepine, Ur Scrn NONE DETECTED NONE DETECTED   Methadone Scn, Ur NONE DETECTED NONE DETECTED    Comment: (NOTE) 563  Tricyclics, urine               Cutoff 1000 ng/mL 200  Amphetamines, urine             Cutoff 1000 ng/mL 300  MDMA (Ecstasy), urine           Cutoff 500 ng/mL 400  Cocaine Metabolite, urine       Cutoff 300 ng/mL 500  Opiate, urine                   Cutoff 300 ng/mL 600  Phencyclidine (PCP), urine      Cutoff 25 ng/mL 700  Cannabinoid, urine              Cutoff 50 ng/mL 800  Barbiturates, urine             Cutoff 200 ng/mL 900   Benzodiazepine, urine           Cutoff 200 ng/mL 1000 Methadone, urine                Cutoff 300 ng/mL 1100 1200 The urine drug screen provides only a preliminary, unconfirmed 1300 analytical test result and should not be used for non-medical 1400 purposes. Clinical consideration and professional judgment should 1500 be applied to any positive drug screen result due to possible 1600 interfering substances. A more specific alternate chemical method 1700 must be used in order to obtain a confirmed analytical result.  1800 Gas chromato graphy / mass spectrometry (GC/MS) is the preferred 1900 confirmatory method.   Urinalysis, Routine w reflex microscopic     Status: Abnormal   Collection Time: 06/21/17  7:41 PM  Result Value Ref Range   Color, Urine STRAW (A) YELLOW   APPearance CLEAR (A) CLEAR   Specific Gravity, Urine 1.036 (H) 1.005 - 1.030   pH 6.0 5.0 - 8.0   Glucose, UA >=500 (A) NEGATIVE mg/dL   Hgb urine dipstick NEGATIVE NEGATIVE   Bilirubin Urine NEGATIVE NEGATIVE   Ketones, ur 5 (A) NEGATIVE mg/dL   Protein, ur NEGATIVE NEGATIVE mg/dL   Nitrite NEGATIVE NEGATIVE   Leukocytes, UA NEGATIVE NEGATIVE   RBC / HPF 0-5 0 - 5 RBC/hpf   WBC, UA 0-5 0 - 5 WBC/hpf   Bacteria, UA NONE SEEN NONE SEEN   Squamous Epithelial / LPF NONE SEEN NONE SEEN  Glucose, capillary     Status: Abnormal   Collection Time: 06/21/17  9:11 PM  Result Value Ref Range   Glucose-Capillary 161 (H) 65 - 99 mg/dL  Troponin I (q 6hr x 3)     Status: None   Collection Time: 06/21/17  9:13 PM  Result Value Ref Range   Troponin I <0.03 <0.03 ng/mL  MRSA PCR Screening     Status: None   Collection Time: 06/21/17  9:23 PM  Result Value Ref Range   MRSA by PCR NEGATIVE NEGATIVE    Comment:        The GeneXpert MRSA Assay (FDA approved for NASAL specimens only), is one component of a comprehensive MRSA colonization surveillance program. It is not intended to diagnose MRSA infection nor to guide  or monitor treatment for MRSA infections.   Heparin level (unfractionated)     Status: Abnormal   Collection Time: 06/22/17  2:08 AM  Result Value Ref Range   Heparin Unfractionated 0.10 (L) 0.30 - 0.70 IU/mL    Comment:        IF HEPARIN RESULTS ARE BELOW EXPECTED VALUES, AND PATIENT DOSAGE HAS BEEN CONFIRMED, SUGGEST FOLLOW UP TESTING OF ANTITHROMBIN III LEVELS.   Basic metabolic panel     Status: Abnormal   Collection Time: 06/22/17  2:08 AM  Result Value Ref Range   Sodium 137 135 - 145 mmol/L   Potassium 3.4 (L) 3.5 - 5.1 mmol/L   Chloride 103 101 - 111 mmol/L   CO2 23 22 - 32 mmol/L   Glucose, Bld 134 (H) 65 - 99 mg/dL   BUN 9 6 - 20 mg/dL   Creatinine, Ser 0.75 0.61 - 1.24 mg/dL  Calcium 9.4 8.9 - 10.3 mg/dL   GFR calc non Af Amer >60 >60 mL/min   GFR calc Af Amer >60 >60 mL/min    Comment: (NOTE) The eGFR has been calculated using the CKD EPI equation. This calculation has not been validated in all clinical situations. eGFR's persistently <60 mL/min signify possible Chronic Kidney Disease.    Anion gap 11 5 - 15  CBC     Status: Abnormal   Collection Time: 06/22/17  2:08 AM  Result Value Ref Range   WBC 13.5 (H) 3.8 - 10.6 K/uL   RBC 5.28 4.40 - 5.90 MIL/uL   Hemoglobin 16.1 13.0 - 18.0 g/dL   HCT 46.3 40.0 - 52.0 %   MCV 87.8 80.0 - 100.0 fL   MCH 30.5 26.0 - 34.0 pg   MCHC 34.7 32.0 - 36.0 g/dL   RDW 13.4 11.5 - 14.5 %   Platelets 265 150 - 440 K/uL    Comment: PLATELET CLUMPS NOTED ON SMEAR, COUNT APPEARS ADEQUATE  Troponin I (q 6hr x 3)     Status: None   Collection Time: 06/22/17  2:08 AM  Result Value Ref Range   Troponin I <0.03 <0.03 ng/mL  HIV antibody (Routine Testing)     Status: None   Collection Time: 06/22/17  2:08 AM  Result Value Ref Range   HIV Screen 4th Generation wRfx Non Reactive Non Reactive    Comment: (NOTE) Performed At: [facility] [place], [place] [id number] Rush Farmer MD [id number]    Hemoglobin A1c     Status: Abnormal   Collection Time: 06/22/17  2:08 AM  Result Value Ref Range   Hgb A1c MFr Bld 7.6 (H) 4.8 - 5.6 %    Comment: (NOTE) Pre diabetes:          5.7%-6.4% Diabetes:              >6.4% Glycemic control for   <7.0% adults with diabetes    Mean Plasma Glucose 171.42 mg/dL    Comment: Performed at [facility], [place] [address], [place], [place] [address]  Lipid panel     Status: None   Collection Time: 06/22/17  2:08 AM  Result Value Ref Range   Cholesterol 145 0 - 200 mg/dL   Triglycerides 128 <150 mg/dL   HDL 52 >40 mg/dL   Total CHOL/HDL Ratio 2.8 RATIO   VLDL 26 0 - 40 mg/dL   LDL Cholesterol 67 0 - 99 mg/dL    Comment:        Total Cholesterol/HDL:CHD Risk Coronary Heart Disease Risk Table                     Men   Women  1/2 Average Risk   3.4   3.3  Average Risk       5.0   4.4  2 X Average Risk   9.6   7.1  3 X Average Risk  23.4   11.0        Use the calculated Patient Ratio above and the CHD Risk Table to determine the patient's CHD Risk.        ATP III CLASSIFICATION (LDL):  <100     mg/dL   Optimal  100-129  mg/dL   Near or Above                    Optimal  130-159  mg/dL   Borderline  160-189  mg/dL   High  >190     mg/dL   Very High   Glucose, capillary     Status: Abnormal   Collection Time: 06/22/17  7:40 AM  Result Value Ref Range   Glucose-Capillary 127 (H) 65 - 99 mg/dL  Troponin I (q 6hr x 3)     Status: None   Collection Time: 06/22/17  8:13 AM  Result Value Ref Range   Troponin I <0.03 <0.03 ng/mL  ECHOCARDIOGRAM COMPLETE     Status: None   Collection Time: 06/22/17  9:46 AM  Result Value Ref Range   Weight 3,008 oz   Height 70 in   BP 134/71 mmHg  Heparin level (unfractionated)     Status: Abnormal   Collection Time: 06/22/17 10:55 AM  Result Value Ref Range   Heparin Unfractionated 0.24 (L) 0.30 - 0.70 IU/mL    Comment:        IF HEPARIN RESULTS ARE BELOW EXPECTED VALUES, AND PATIENT DOSAGE HAS  BEEN CONFIRMED, SUGGEST FOLLOW UP TESTING OF ANTITHROMBIN III LEVELS.   Glucose, capillary     Status: Abnormal   Collection Time: 06/22/17 11:48 AM  Result Value Ref Range   Glucose-Capillary 214 (H) 65 - 99 mg/dL  Glucose, capillary     Status: Abnormal   Collection Time: 06/22/17  5:20 PM  Result Value Ref Range   Glucose-Capillary 152 (H) 65 - 99 mg/dL  Heparin level (unfractionated)     Status: None   Collection Time: 06/22/17  7:21 PM  Result Value Ref Range   Heparin Unfractionated 0.34 0.30 - 0.70 IU/mL    Comment:        IF HEPARIN RESULTS ARE BELOW EXPECTED VALUES, AND PATIENT DOSAGE HAS BEEN CONFIRMED, SUGGEST FOLLOW UP TESTING OF ANTITHROMBIN III LEVELS.   Glucose, capillary     Status: Abnormal   Collection Time: 06/22/17  9:15 PM  Result Value Ref Range   Glucose-Capillary 127 (H) 65 - 99 mg/dL  Heparin level (unfractionated)     Status: Abnormal   Collection Time: 06/23/17  1:29 AM  Result Value Ref Range   Heparin Unfractionated 0.28 (L) 0.30 - 0.70 IU/mL    Comment:        IF HEPARIN RESULTS ARE BELOW EXPECTED VALUES, AND PATIENT DOSAGE HAS BEEN CONFIRMED, SUGGEST FOLLOW UP TESTING OF ANTITHROMBIN III LEVELS.   CBC     Status: Abnormal   Collection Time: 06/23/17  1:29 AM  Result Value Ref Range   WBC 11.9 (H) 3.8 - 10.6 K/uL   RBC 5.25 4.40 - 5.90 MIL/uL   Hemoglobin 15.4 13.0 - 18.0 g/dL   HCT 46.5 40.0 - 52.0 %   MCV 88.5 80.0 - 100.0 fL   MCH 29.4 26.0 - 34.0 pg   MCHC 33.2 32.0 - 36.0 g/dL   RDW 13.4 11.5 - 14.5 %   Platelets 309 150 - 440 K/uL    Comment: COUNT MAY BE INACCURATE DUE TO FIBRIN CLUMPS.  Glucose, capillary     Status: Abnormal   Collection Time: 06/23/17  7:16 AM  Result Value Ref Range   Glucose-Capillary 142 (H) 65 - 99 mg/dL  CBC     Status: Abnormal   Collection Time: 06/23/17  7:39 AM  Result Value Ref Range   WBC 12.6 (H) 3.8 - 10.6 K/uL   RBC 4.92 4.40 - 5.90 MIL/uL   Hemoglobin 14.8 13.0 - 18.0 g/dL   HCT 43.6  40.0 - 52.0 %  MCV 88.5 80.0 - 100.0 fL   MCH 30.0 26.0 - 34.0 pg   MCHC 33.9 32.0 - 36.0 g/dL   RDW 13.8 11.5 - 14.5 %   Platelets 307 150 - 440 K/uL    Comment: PLATELET CLUMPS NOTED ON SMEAR, COUNT APPEARS ADEQUATE  Heparin level (unfractionated)     Status: None   Collection Time: 06/23/17  8:02 AM  Result Value Ref Range   Heparin Unfractionated 0.61 0.30 - 0.70 IU/mL    Comment:        IF HEPARIN RESULTS ARE BELOW EXPECTED VALUES, AND PATIENT DOSAGE HAS BEEN CONFIRMED, SUGGEST FOLLOW UP TESTING OF ANTITHROMBIN III LEVELS.   Glucose, capillary     Status: Abnormal   Collection Time: 06/23/17 11:40 AM  Result Value Ref Range   Glucose-Capillary 215 (H) 65 - 99 mg/dL   Comment 1 Notify RN   CBC with Differential/Platelet     Status: Abnormal   Collection Time: 07/15/17 11:51 AM  Result Value Ref Range   WBC 10.0 3.4 - 10.8 x10E3/uL   RBC 4.81 4.14 - 5.80 x10E6/uL   Hemoglobin 14.9 13.0 - 17.7 g/dL   Hematocrit 43.3 37.5 - 51.0 %   MCV 90 79 - 97 fL   MCH 31.0 26.6 - 33.0 pg   MCHC 34.4 31.5 - 35.7 g/dL   RDW 13.8 12.3 - 15.4 %   Platelets 339 150 - 379 x10E3/uL   Neutrophils 62 Not Estab. %   Lymphs 24 Not Estab. %   Monocytes 6 Not Estab. %   Eos 7 Not Estab. %   Basos 1 Not Estab. %   Neutrophils Absolute 6.3 1.4 - 7.0 x10E3/uL   Lymphocytes Absolute 2.4 0.7 - 3.1 x10E3/uL   Monocytes Absolute 0.6 0.1 - 0.9 x10E3/uL   EOS (ABSOLUTE) 0.7 (H) 0.0 - 0.4 x10E3/uL   Basophils Absolute 0.1 0.0 - 0.2 x10E3/uL   Immature Granulocytes 0 Not Estab. %   Immature Grans (Abs) 0.0 0.0 - 0.1 [id number]  Basic metabolic panel     Status: Abnormal   Collection Time: 07/15/17 11:51 AM  Result Value Ref Range   Glucose 141 (H) 65 - 99 mg/dL   BUN 11 8 - 27 mg/dL   Creatinine, Ser 0.67 (L) 0.76 - 1.27 mg/dL   GFR calc non Af Amer 103 >59 mL/min/1.73   GFR calc Af Amer 119 >59 mL/min/1.73   BUN/Creatinine Ratio 16 10 - 24   Sodium 142 134 - 144 mmol/L   Potassium 4.5 3.5 - 5.2  mmol/L   Chloride 102 96 - 106 mmol/L   CO2 24 20 - 29 mmol/L   Calcium 9.7 8.6 - 10.2 mg/dL    Radiology Ct Head Wo Contrast  Result Date: 06/22/2017 CLINICAL DATA:  61 y/o M; difficulties with memory and complaints of right-sided weakness. EXAM: CT HEAD WITHOUT CONTRAST TECHNIQUE: Contiguous axial images were obtained from the base of the skull through the vertex without intravenous contrast. COMPARISON:  06/21/2017 and 04/29/2015 CT of the head. FINDINGS: Brain: Small left precentral gyrus cortical lucency (series 2, image 27) and left medial parietal cortical lucency (series 2, image 24) with increased conspicuity from prior CT of head compatible with evolving acute infarction. No acute hemorrhage, mass effect, or hydrocephalus. No extra-axial collection or effacement of basilar cisterns. Chronic left occipital infarction. Vascular: Mild calcific atherosclerosis of carotid siphons. No hyperdense vessel. Skull: Normal. Negative for fracture or focal lesion. Sinuses/Orbits: No acute finding. Other: None. IMPRESSION: 1.  Small cortical infarct in left precentral gyrus and left medial parietal lobe with increased conspicuity, likely representing evolving late acute/early subacute infarctions. 2. No hemorrhage or mass effect. 3. Stable chronic left occipital lobe infarction. These results will be called to the ordering clinician or representative by the Radiologist Assistant, and communication documented in the PACS or zVision Dashboard. Electronically Signed   By: Kristine Garbe M.D.   On: 06/22/2017 15:38   Ct Head Wo Contrast  Result Date: 06/21/2017 CLINICAL DATA:  Pt reports Right foot going numb last night, unknown as to what time. Pt also has hx of diabetes. Pt thinks numbness started around 9pm. Pt states he cannot lift his R leg at all at this time. Decreased right lower ext pulse a.*comment was truncated*^114m ISOVUE-370 IOPAMIDOL (ISOVUE-370) INJECTION 76% EXAM: CT HEAD WITHOUT  CONTRAST TECHNIQUE: Contiguous axial images were obtained from the base of the skull through the vertex without intravenous contrast. COMPARISON:  04/29/2015 FINDINGS: Brain: No acute intracranial hemorrhage. No focal mass lesion. No CT evidence of acute infarction. No midline shift or mass effect. No hydrocephalus. Basilar cisterns are patent. Remote LEFT occipital infarction with encephalomalacia. Vascular: No hyperdense vessel or unexpected calcification. Skull: Normal. Negative for fracture or focal lesion. Sinuses/Orbits: Paranasal sinuses and mastoid air cells are clear. Orbits are clear. Other: None. IMPRESSION: 1. No acute intracranial findings. 2. Remote LEFT occipital infarction. Electronically Signed   By: SSuzy BouchardM.D.   On: 06/21/2017 18:11   Ct Angio Ao+bifem W & Or Wo Contrast  Result Date: 06/21/2017 CLINICAL DATA:  Right foot numbness and cold to touch EXAM: CT ANGIOGRAPHY AOBIFEM WITHOUT AND WITH CONTRAST TECHNIQUE: Using angiographic technique, postcontrast CT images of the abdomen and pelvis and lower extremities were obtained, from the lung bases through both feet. CONTRAST:  1260mISOVUE-370 IOPAMIDOL (ISOVUE-370) INJECTION 76% COMPARISON:  None. FINDINGS: VASCULAR FINDINGS Aorta: Normal caliber distal thoracic and abdominal aorta. There is moderate calcified and noncalcified plaque in the infrarenal aorta without hemodynamically significant stenosis. Celiac axis:  Normal. Superior mesenteric artery: Normal. Renal arteries: Single renal arteries bilaterally. No stenosis or other abnormality. Inferior mesenteric artery:  Normal. Inflow: There is severe, predominantly noncalcified plaque within the right common iliac artery that causes severe stenosis just proximal to the right iliac bifurcation. The right external iliac artery is widely patent. There is short segment occlusion of the right internal iliac artery which reconstitutes distally. There is moderate atherosclerotic  noncalcified plaque in the left common iliac artery without hemodynamically significant stenosis. Mild atherosclerosis of the left internal and external iliac arteries without hemodynamically significant stenosis. RIGHT LOWER EXTREMITY Femoral artery: Mixed calcified and noncalcified plaque at the femoral bifurcation. The femoral and deep femoral branches remain widely patent. The course of the femoral artery is normal to the level of the knee. Popliteal artery: Normal. Runoff: The right posterior tibial artery is occluded at the level of the distal tibial metaphysis. The right dorsalis pedis is patent. The right peroneal artery progressive leak tapers as it approaches the ankle without an abrupt occlusion. LEFT LOWER EXTREMITY Femoral artery: Femoral and deep femoral branches are normal. Femoral artery is normal to the level of the knee. Popliteal artery: Normal Runoff: There is normal three-vessel runoff to the level of the ankle. Review of the MIP images confirms the above findings. NONVASCULAR FINDINGS Lower chest: 7 mm right middle lobe nodule. No pleural effusion. There is multifocal pericardial calcification. Hepatobiliary: The liver is enlarged. Cholelithiasis without acute inflammation. Pancreas: Normal contours  without ductal dilatation. No peripancreatic fluid collection. Spleen: Normal. Adrenals/Urinary Tract: --Adrenal glands: Normal. --Right kidney/ureter: No hydronephrosis or perinephric stranding. No nephrolithiasis. No obstructing ureteral stones. --Left kidney/ureter: No hydronephrosis or perinephric stranding. No nephrolithiasis. No obstructing ureteral stones. --Urinary bladder: Unremarkable. Stomach/Bowel: --Stomach/Duodenum: No hiatal hernia or other gastric abnormality. Normal duodenal course and caliber. --Small bowel: No dilatation or inflammation. --Colon: Rectosigmoid and descending colonic diverticulosis without diverticulitis. No other focal colonic abnormality. --Appendix: Surgically  absent. Lymphatic:  No abdominal or pelvic lymphadenopathy. Reproductive: Mildly enlarged prostate. Musculoskeletal. No bony spinal canal stenosis or focal osseous abnormality. Other: None. IMPRESSION: 1. 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 2. Tapered appearance of the right peroneal artery as it approaches the ankle without abrupt occlusion. Patent right dorsalis pedis. 3. Normal left three-vessel runoff to the level of the ankle. 4. 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 5.  Aortic Atherosclerosis (ICD10-I70.0). 6. Cholelithiasis and diverticulosis without acute inflammation. 7. 7 mm right middle lobe pulmonary nodule. Non-contrast chest CT at 6-12 months is recommended. If the nodule is stable at time of repeat CT, then future CT at 18-24 months (from today's scan) is considered optional for low-risk patients, but is recommended for high-risk patients. This recommendation follows the consensus statement: Guidelines for Management of Incidental Pulmonary Nodules Detected on CT Images: From the Fleischner Society 2017; Radiology 2017; 284:228-243. Critical Value/emergent results were called by telephone at the time of interpretation on 06/21/2017 at 6:48 pm to Dr. Larae Grooms , who verbally acknowledged these results. Electronically Signed   By: Ulyses Jarred M.D.   On: 06/21/2017 18:54   US Carotid Bilateral (at [facility] Only)  Result Date: 06/22/2017 CLINICAL DATA:  CVA EXAM: BILATERAL CAROTID DUPLEX ULTRASOUND TECHNIQUE: Pearline Cables scale imaging, color Doppler and duplex ultrasound were performed of bilateral carotid and vertebral arteries in the neck. COMPARISON:  None. FINDINGS: Criteria: Quantification of carotid stenosis is based on velocity parameters that correlate the residual internal  carotid diameter with NASCET-based stenosis levels, using the diameter of the distal internal carotid lumen as the denominator for stenosis measurement. The following velocity measurements were obtained: RIGHT ICA:  76 cm/sec CCA:  99 cm/sec SYSTOLIC ICA/CCA RATIO:  0.8 DIASTOLIC ICA/CCA RATIO:  1.6 ECA:  94 cm/sec LEFT ICA:  83 cm/sec CCA:  84 cm/sec SYSTOLIC ICA/CCA RATIO:  1.0 DIASTOLIC ICA/CCA RATIO:  1.5 ECA:  76 cm/sec RIGHT CAROTID ARTERY: Little if any plaque in the bulb. Low resistance internal carotid Doppler pattern. Tachycardia. RIGHT VERTEBRAL ARTERY:  Antegrade. LEFT CAROTID ARTERY: Little if any plaque in the bulb. Low resistance internal carotid Doppler pattern. LEFT VERTEBRAL ARTERY:  Antegrade. There is a large hypoechoic and heterogeneous mass in the left side of the neck measuring 2.9 x 1.4 x 1.7 cm. A pathological and enlarged lymph node is not excluded. IMPRESSION: Less than 50% stenosis in the the right and left internal carotid artery There is a hypoechoic mass in the left side of the neck worrisome for an enlarged lymph node. Malignancy is not excluded. CT neck with contrast is recommended. Electronically Signed   By: Marybelle Killings M.D.   On: 06/22/2017 10:33   Dg Chest Port 1 View  Result Date: 06/21/2017 CLINICAL DATA:  Tachycardia.  Sweating. EXAM: PORTABLE CHEST 1 VIEW COMPARISON:  None. FINDINGS: The heart size and mediastinal contours  are within normal limits. Both lungs are clear. The visualized skeletal structures are unremarkable. IMPRESSION: Normal exam. Electronically Signed   By: Lorriane Shire M.D.   On: 06/21/2017 17:10     Assessment/Plan  Hypertension blood pressure control important in reducing the progression of atherosclerotic disease. On appropriate oral medications.   Stroke [facility]) Seems to be recovering reasonably well.  Hard to discern how much of his residual symptoms are related to his PAD and how much may be related to his stroke.  Overall, they are  not disabling.  Diabetic peripheral neuropathy ([name]) blood glucose control important in reducing the progression of atherosclerotic disease. Also, involved in wound healing. On appropriate medications.   Tobacco abuse We had a discussion for approximately 3-4 minutes regarding the absolute need for smoking cessation due to the deleterious nature of tobacco on the vascular system. We discussed the tobacco use would diminish patency of any intervention, and likely significantly worsen progressio of disease. We discussed multiple agents for quitting including replacement therapy or medications to reduce cravings such as Chantix. The patient voices their understanding of the importance of smoking cessation.   PVD (peripheral vascular disease) ([name]) Right iliac occlusion is present on CT scan.  Reasonably well collateralized.  Symptoms are not disabling.  No limb threatening symptoms.  Patient declines intervention at this time.  Stressed the importance of smoking cessation, increasing his activity, and we will plan to see him back in 3 months with follow-up noninvasive studies.    Leotis Pain, MD  07/20/2017 3:38 PM    This note was created with Dragon medical transcription system.  Any errors from dictation are purely unintentional

## 2017-07-20 NOTE — Assessment & Plan Note (Signed)
Right iliac occlusion is present on CT scan.  Reasonably well collateralized.  Symptoms are not disabling.  No limb threatening symptoms.  Patient declines intervention at this time.  Stressed the importance of smoking cessation, increasing his activity, and we will plan to see him back in 3 months with follow-up noninvasive studies.

## 2017-07-20 NOTE — Assessment & Plan Note (Signed)
Seems to be recovering reasonably well.  Hard to discern how much of his residual symptoms are related to his PAD and how much may be related to his stroke.  Overall, they are not disabling.

## 2017-07-20 NOTE — Assessment & Plan Note (Signed)
We had a discussion for approximately 3-4 minutes regarding the absolute need for smoking cessation due to the deleterious nature of tobacco on the vascular system. We discussed the tobacco use would diminish patency of any intervention, and likely significantly worsen progressio of disease. We discussed multiple agents for quitting including replacement therapy or medications to reduce cravings such as Chantix. The patient voices their understanding of the importance of smoking cessation.  

## 2017-07-20 NOTE — Assessment & Plan Note (Signed)
blood pressure control important in reducing the progression of atherosclerotic disease. On appropriate oral medications.  

## 2017-07-20 NOTE — Assessment & Plan Note (Signed)
blood glucose control important in reducing the progression of atherosclerotic disease. Also, involved in wound healing. On appropriate medications.  

## 2017-07-20 NOTE — Patient Instructions (Signed)

## 2017-07-21 ENCOUNTER — Ambulatory Visit: Payer: Medicare Other

## 2017-07-21 DIAGNOSIS — R2689 Other abnormalities of gait and mobility: Secondary | ICD-10-CM | POA: Diagnosis not present

## 2017-07-21 DIAGNOSIS — R278 Other lack of coordination: Secondary | ICD-10-CM | POA: Diagnosis not present

## 2017-07-21 DIAGNOSIS — M6281 Muscle weakness (generalized): Secondary | ICD-10-CM

## 2017-07-21 NOTE — Therapy (Signed)
[place] MAIN [facility] SERVICES [address] [place], [place], [address] Phone: [phone]   Fax:  [phone]  Physical Therapy Treatment  Patient Details  Name: [name]. MRN: [id number] Date of Birth: 1956-05-19 Referring Provider: [name], OD   Encounter Date: 07/21/2017  PT End of Session - 07/21/17 1021    Visit Number  3    Number of Visits  16    Date for PT Re-Evaluation  09/10/17    Authorization - Visit Number  3    Authorization - Number of Visits  10    PT Start Time  7591    PT Stop Time  1100    PT Time Calculation (min)  45 min    Equipment Utilized During Treatment  Gait belt    Activity Tolerance  Patient tolerated treatment well    Behavior During Therapy  WFL for tasks assessed/performed;Impulsive       Past Medical History:  Diagnosis Date  . Diabetes ([place])   . History of hernia repair   . Hypertension   . Stroke [facility])     Past Surgical History:  Procedure Laterality Date  . APPENDECTOMY    . HERNIA REPAIR      There were no vitals filed for this visit.  Subjective Assessment - 07/21/17 1018    Subjective  Patient and wife reports patient being stiff and popping and cracking due to cold weather. Went shopping yesterday and did some walking in the store.     Patient is accompained by:  Family member    Pertinent History   Patient released from hospital 62 days ago. 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  Presents with weakness of R leg. History of memory deficits from stroke 2.5 years ago. 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 Has a cane but refuses to use it.     Limitations  Walking;Standing;House hold activities;Other (comment)    How long can you sit comfortably?  2-3 hours    How long can you stand comfortably?  15-20 minutes    How long can you walk comfortably?   unsteady with walking     Patient Stated Goals  get better at doing stairs, walk better, be more steady.     Currently in Pain?  No/denies         Nustep Lvl 3 4 minutes, cues for keeping RPM over 60.   Ambulating in hallway with cues for opposite arms and legs, challenged by performing this task, required 8 stops to reset.   Step forward clasp and step back in // bars 10x   step forward and swing opposite arm 15x each leg, Challenging to patient   Airex pad: horizontal head turns playing eye spy for 5 minutes, cues for crossing midline with gaze to decrease loss of visual field from blind eye.   Step over and back hurdle 10x each leg. More challenging to RLE due to stiffness of hip flexor and weakness of anterior musculature.    Seated marching 20x    Pt. response to medical necessity: . Patient will continue to benefit from skilled physical therapy to improve strength and balance          PT Education - 07/21/17 1020    Education provided  Yes    Education Details  keeping head up when ambulating.  strength and flexion.     Person(s) Educated  Patient    Methods  Explanation;Demonstration;Verbal cues    Comprehension  Verbalized understanding;Returned demonstration       PT Short Term Goals - 07/16/17 1320      PT SHORT TERM GOAL #1   Title  Patient will increase 10 meter walk test to >1.68m/s as to improve gait speed for better community ambulation and to reduce fall risk.    Baseline  12/14: .9 m/s     Time  2    Period  Weeks    Status  New    Target Date  07/30/17      PT SHORT TERM GOAL #2   Title  Patient will be independent in bending down towards floor and picking up small object (<5 pounds) and then stand back up without loss of balance as to improve ability to pick up and clean up room at home    Baseline  require supervision due to poor coordination/control of concentric and eccentric motion of squat     Time  2    Period  Weeks    Status  New     Target Date  07/30/17      PT SHORT TERM GOAL #3   Title  Patient will tolerate 5 seconds of single leg stance without loss of balance to improve ability to get in and out of shower safely.    Baseline  12/14: unable to perform     Time  2    Period  Weeks    Status  New    Target Date  07/30/17        PT Long Term Goals - 07/16/17 1322      PT LONG TERM GOAL #1   Title  Patient will increase Berg Balance score by > 6 points (44/56) to demonstrate decreased fall risk during functional activities.    Baseline  12/14: 38/56    Time  8    Period  Weeks    Status  New    Target Date  09/10/17      PT LONG TERM GOAL #2   Title  Patient (> 62 years old) will complete five times sit to stand test in < 15 seconds indicating an increased LE strength and improved balance.    Baseline  12/14: 23 seconds    Time  8    Period  Weeks    Status  New    Target Date  09/10/17      PT LONG TERM GOAL #3   Title  Patient will increase ABC scale score >60% to demonstrate better functional mobility and better confidence with ADLs    Baseline  12/14: 36.9%    Time  8    Period  Weeks    Status  New    Target Date  09/10/17      PT LONG TERM GOAL #4   Title  Patient will increase lower extremity functional scale to >50/80 to demonstrate improved functional mobility and increased tolerance with ADLs.     Baseline  12/14: 21/80    Time  8    Period  Weeks    Status  New    Target Date  09/10/17      PT LONG TERM GOAL #5   Title  Patient will increase BLE gross strength to 4+/5 as to improve functional strength for independent gait, increased standing tolerance and increased ADL ability.  Baseline  12/14: 2+/5     Time  8    Period  Weeks    Status  New    Target Date  09/10/17            Plan - 07/21/17 1055    Clinical Impression Statement  Patient challenged by reciprocal motion as well as lifting RLE>LLE affecting gait pattern and safety of ambulation. Patient weakness of  RLE combined with stiffness of limb makes flexion of limb challenging.Patient will continue to benefit from skilled physical therapy to improve strength and balance      Rehab Potential  Fair    Clinical Impairments Affecting Rehab Potential  (-) unstable cardiac condition, limited vision in R eye, poor short term memory, (+) good family support, motivated to improve     PT Frequency  2x / week    PT Duration  8 weeks    PT Treatment/Interventions  ADLs/Self Care Home Management;Aquatic Therapy;Biofeedback;Cryotherapy;Electrical Stimulation;Traction;Ultrasound;Moist Heat;Iontophoresis 4mg /ml Dexamethasone;DME Instruction;Gait training;Stair training;Functional mobility training;Therapeutic activities;Therapeutic exercise;Patient/family education;Orthotic Fit/Training;Neuromuscular re-education;Balance training;Manual techniques;Taping;Energy conservation;Passive range of motion;Vestibular;Visual/perceptual remediation/compensation    PT Next Visit Plan  review HEP, strengthen hip flexors, practice stepping up.     PT Home Exercise Plan  see sheet    Consulted and Agree with Plan of Care  Patient;Family member/caregiver    Family Member Consulted  wife       Patient will benefit from skilled therapeutic intervention in order to improve the following deficits and impairments:  Abnormal gait, Decreased activity tolerance, Decreased balance, Decreased knowledge of precautions, Decreased endurance, Decreased coordination, Decreased cognition, Decreased knowledge of use of DME, Decreased mobility, Decreased range of motion, Decreased safety awareness, Difficulty walking, Decreased strength, Impaired flexibility, Impaired perceived functional ability, Impaired sensation, Impaired UE functional use, Impaired vision/preception, Postural dysfunction, Improper body mechanics, Pain  Visit Diagnosis: Muscle weakness (generalized)  Other abnormalities of gait and mobility  Other lack of  coordination     Problem List Patient Active Problem List   Diagnosis Date Noted  . PVD (peripheral vascular disease) ([place]) 07/15/2017  . Stroke ([place]) 06/21/2017  . Hypertension 01/03/2016  . Memory loss 09/27/2015  . 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 Diabetic peripheral neuropathy ([place]) 05/16/2015  . History of stroke with residual deficit 04/01/2015  . Chronic headaches 03/15/2015  . Tobacco abuse 01/24/2015  . Hyperlipidemia 01/21/2015  . Benign hypertensive renal disease 01/21/2015   Janna Arch, PT, DPT   Janna Arch 07/21/2017, 11:00 AM  [place] MAIN [facility] SERVICES [address] [place], [place], [address] Phone: [phone]   Fax:  [phone]  Name: Jashun Puertas. MRN: [id number] Date of Birth: Dec 21, 1955

## 2017-07-22 ENCOUNTER — Telehealth: Payer: Self-pay | Admitting: Family Medicine

## 2017-07-22 NOTE — Telephone Encounter (Signed)
Copied from [place]. Topic: Inquiry >> Jul 22, 2017 10:47 AM [name] I, NT wrote: Reason for CRM: The pt call and said the [facility] will be on his net Work next year

## 2017-08-03 DIAGNOSIS — T8859XA Other complications of anesthesia, initial encounter: Secondary | ICD-10-CM

## 2017-08-03 HISTORY — DX: Other complications of anesthesia, initial encounter: T88.59XA

## 2017-08-06 ENCOUNTER — Ambulatory Visit: Payer: Medicare HMO | Attending: Family Medicine

## 2017-08-06 DIAGNOSIS — M6281 Muscle weakness (generalized): Secondary | ICD-10-CM | POA: Insufficient documentation

## 2017-08-06 DIAGNOSIS — R2689 Other abnormalities of gait and mobility: Secondary | ICD-10-CM

## 2017-08-06 DIAGNOSIS — R278 Other lack of coordination: Secondary | ICD-10-CM | POA: Insufficient documentation

## 2017-08-06 NOTE — Patient Instructions (Signed)
SIT TO STAND Bring right foot back slightly behind left, stand up with hands on knees, reach back with right hand to sit back down  If using a chair that moves put it against a wall.   HOME EXERCISES 1x/DAY

## 2017-08-06 NOTE — Therapy (Signed)
[place] MAIN [facility] SERVICES [address] [place], [place], [address] Phone: [phone]   Fax:  [phone]  Physical Therapy Treatment  Patient Details  Name: [name]. MRN: [id number] Date of Birth: 1956-04-04 Referring Provider: [name], OD   Encounter Date: 08/06/2017  PT End of Session - 08/06/17 1142    Visit Number  4    Number of Visits  16    Date for PT Re-Evaluation  09/10/17    Authorization - Visit Number  4    Authorization - Number of Visits  10    PT Start Time  5643    PT Stop Time  1132    PT Time Calculation (min)  47 min    Equipment Utilized During Treatment  Gait belt    Activity Tolerance  Patient tolerated treatment well    Behavior During Therapy  WFL for tasks assessed/performed;Impulsive       Past Medical History:  Diagnosis Date  . Diabetes ([place])   . History of hernia repair   . Hypertension   . Stroke [facility])     Past Surgical History:  Procedure Laterality Date  . APPENDECTOMY    . HERNIA REPAIR      There were no vitals filed for this visit.  Subjective Assessment - 08/06/17 1052    Subjective  Patient and wife reports walking to mailbox and back every day on gravel road, slight increase. Patient and wife state that he has not been as active during the holidays.     Patient is accompained by:  Family member    Pertinent History   Patient released from hospital 17 days ago. 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  Presents with weakness of R leg. History of memory deficits from stroke 2.5 years ago. 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 Has a cane but refuses to use it.     Limitations  Walking;Standing;House hold activities;Other (comment)    How long can you sit comfortably?  2-3 hours    How long can you stand comfortably?  15-20 minutes    How long can you walk  comfortably?  unsteady with walking     Patient Stated Goals  get better at doing stairs, walk better, be more steady.     Currently in Pain?  No/denies       Nustep Lvl 3 4 minutes, cues for keeping RPM over 60.    Ambulating in hallway with cues for opposite arms and legs arm swing and with lifting feet up to prevent scuffing, challenged by performing this task, decreased need for cueing, great improvement from last session, only one need to reset.   Airex pad: static balance 2 minutes ankle instability and trunk sway with no LOB  Airex pad: ball toss with wife, frequent LOB occasionally requiring UE's to reset COM, CGA 5 minutes  Sit to stand transfers : cueing to bring right foot slightly behind left shoulder width apart, push off with hands on knees, and obtain full standing position. Stand to sit with right hand reaching back for chair to prevent left low back spasm. Patient encouraged to implement placing right foot behind left to initiate weight shift to RLE for equal weight bearing.   Resisted knee flexion seated GTB 10x   Seated  knee flexion/extension  with adduction squeezing ball between  ankles 15x    Pt. response to medical necessity: . Patient will continue to benefit from skilled physical therapy to improve strength and balance                          PT Education - 08/06/17 1141    Education provided  Yes    Education Details  monitoring headaches, keep log of blood pressure during headdache, HEP compliance, STS transfer    Person(s) Educated  Patient    Methods  Explanation;Demonstration;Verbal cues    Comprehension  Verbalized understanding;Returned demonstration       PT Short Term Goals - 07/16/17 1320      PT SHORT TERM GOAL #1   Title  Patient will increase 10 meter walk test to >1.27m/s as to improve gait speed for better community ambulation and to reduce fall risk.    Baseline  12/14: .9 m/s     Time  2    Period  Weeks     Status  New    Target Date  07/30/17      PT SHORT TERM GOAL #2   Title  Patient will be independent in bending down towards floor and picking up small object (<5 pounds) and then stand back up without loss of balance as to improve ability to pick up and clean up room at home    Baseline  require supervision due to poor coordination/control of concentric and eccentric motion of squat     Time  2    Period  Weeks    Status  New    Target Date  07/30/17      PT SHORT TERM GOAL #3   Title  Patient will tolerate 5 seconds of single leg stance without loss of balance to improve ability to get in and out of shower safely.    Baseline  12/14: unable to perform     Time  2    Period  Weeks    Status  New    Target Date  07/30/17        PT Long Term Goals - 07/16/17 1322      PT LONG TERM GOAL #1   Title  Patient will increase Berg Balance score by > 6 points (44/56) to demonstrate decreased fall risk during functional activities.    Baseline  12/14: 38/56    Time  8    Period  Weeks    Status  New    Target Date  09/10/17      PT LONG TERM GOAL #2   Title  Patient (> 63 years old) will complete five times sit to stand test in < 15 seconds indicating an increased LE strength and improved balance.    Baseline  12/14: 23 seconds    Time  8    Period  Weeks    Status  New    Target Date  09/10/17      PT LONG TERM GOAL #3   Title  Patient will increase ABC scale score >60% to demonstrate better functional mobility and better confidence with ADLs    Baseline  12/14: 36.9%    Time  8    Period  Weeks    Status  New    Target Date  09/10/17      PT LONG TERM GOAL #4   Title  Patient will increase lower extremity functional scale to >50/80 to demonstrate improved functional mobility and  increased tolerance with ADLs.     Baseline  12/14: 21/80    Time  8    Period  Weeks    Status  New    Target Date  09/10/17      PT LONG TERM GOAL #5   Title  Patient will increase BLE gross  strength to 4+/5 as to improve functional strength for independent gait, increased standing tolerance and increased ADL ability.    Baseline  12/14: 2+/5     Time  8    Period  Weeks    Status  New    Target Date  09/10/17            Plan - 08/06/17 1142    Clinical Impression Statement  Pt. Demonstrated improved arm swing with ambulation.  Patient encouraged to implement placing right foot behind left when performing sit to stand to initiate weight shift to RLE for equal weight bearing. Patient will continue to benefit from skilled physical therapy to improve strength and balance    Rehab Potential  Fair    Clinical Impairments Affecting Rehab Potential  (-) unstable cardiac condition, limited vision in R eye, poor short term memory, (+) good family support, motivated to improve     PT Frequency  2x / week    PT Duration  8 weeks    PT Treatment/Interventions  ADLs/Self Care Home Management;Aquatic Therapy;Biofeedback;Cryotherapy;Electrical Stimulation;Traction;Ultrasound;Moist Heat;Iontophoresis 4mg /ml Dexamethasone;DME Instruction;Gait training;Stair training;Functional mobility training;Therapeutic activities;Therapeutic exercise;Patient/family education;Orthotic Fit/Training;Neuromuscular re-education;Balance training;Manual techniques;Taping;Energy conservation;Passive range of motion;Vestibular;Visual/perceptual remediation/compensation    PT Next Visit Plan  review HEP, strengthen hip flexors, practice stepping up.     PT Home Exercise Plan  see sheet    Consulted and Agree with Plan of Care  Patient;Family member/caregiver    Family Member Consulted  wife       Patient will benefit from skilled therapeutic intervention in order to improve the following deficits and impairments:  Abnormal gait, Decreased activity tolerance, Decreased balance, Decreased knowledge of precautions, Decreased endurance, Decreased coordination, Decreased cognition, Decreased knowledge of use of DME,  Decreased mobility, Decreased range of motion, Decreased safety awareness, Difficulty walking, Decreased strength, Impaired flexibility, Impaired perceived functional ability, Impaired sensation, Impaired UE functional use, Impaired vision/preception, Postural dysfunction, Improper body mechanics, Pain  Visit Diagnosis: Muscle weakness (generalized)  Other abnormalities of gait and mobility  Other lack of coordination     Problem List Patient Active Problem List   Diagnosis Date Noted  . PVD (peripheral vascular disease) ([place]) 07/15/2017  . Stroke ([place]) 06/21/2017  . Hypertension 01/03/2016  . Memory loss 09/27/2015  . 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 Diabetic peripheral neuropathy ([place]) 05/16/2015  . History of stroke with residual deficit 04/01/2015  . Chronic headaches 03/15/2015  . Tobacco abuse 01/24/2015  . Hyperlipidemia 01/21/2015  . Benign hypertensive renal disease 01/21/2015   Janna Arch, PT, DPT   Janna Arch 08/06/2017, 11:44 AM  [place] MAIN [facility] SERVICES [address] [place], [place], [address] Phone: [phone]   Fax:  [phone]  Name: Fishel Wamble. MRN: [id number] Date of Birth: [birth year]

## 2017-08-10 ENCOUNTER — Ambulatory Visit: Payer: Medicare HMO

## 2017-08-10 DIAGNOSIS — R2689 Other abnormalities of gait and mobility: Secondary | ICD-10-CM | POA: Diagnosis not present

## 2017-08-10 DIAGNOSIS — M6281 Muscle weakness (generalized): Secondary | ICD-10-CM | POA: Diagnosis not present

## 2017-08-10 DIAGNOSIS — R278 Other lack of coordination: Secondary | ICD-10-CM | POA: Diagnosis not present

## 2017-08-10 NOTE — Therapy (Signed)
[place] MAIN [facility] SERVICES [address] [place], [place], [address] Phone: [phone]   Fax:  [phone]  Physical Therapy Treatment  Patient Details  Name: [name]. MRN: [id number] Date of Birth: [birth year] Referring Provider: [name], OD   Encounter Date: 08/10/2017  PT End of Session - 08/10/17 0936    Visit Number  5    Number of Visits  16    Date for PT Re-Evaluation  09/10/17    Authorization - Visit Number  5    Authorization - Number of Visits  10    PT Start Time  0930    PT Stop Time  1015    PT Time Calculation (min)  45 min    Equipment Utilized During Treatment  Gait belt    Activity Tolerance  Patient tolerated treatment well    Behavior During Therapy  [facility] for tasks assessed/performed;Impulsive       Past Medical History:  Diagnosis Date  . Diabetes ([place])   . History of hernia repair   . Hypertension   . Stroke [facility])     Past Surgical History:  Procedure Laterality Date  . APPENDECTOMY    . HERNIA REPAIR      There were no vitals filed for this visit.  Subjective Assessment - 08/10/17 0934    Subjective  Patient reports compliance with HEP, wife shaking head reports very little. Does go to mailbox every day which is a longer walk, did that twice yesterday. No falls since last visit.     Patient is accompained by:  Family member    Pertinent History   Patient released from hospital 17 days ago. 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  Presents with weakness of R leg. History of memory deficits from stroke 2.5 years ago. 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 Has a cane but refuses to use it.     Limitations  Walking;Standing;House hold activities;Other (comment)    How long can you sit comfortably?  2-3 hours    How long can you stand comfortably?  15-20 minutes    How long can  you walk comfortably?  unsteady with walking     Patient Stated Goals  get better at doing stairs, walk better, be more steady.     Currently in Pain?  No/denies        Nustep Lvl 3 4 minutes, cues for keeping RPM over 60.      Ambulating in hallway with cues for opposite arms and legs arm swing and with lifting feet up to prevent scuffing, challenged by performing this task, decreased need for cueing,slight regression from last session requiring three stops to reset.    Airex pad: static balance 2 minutes ankle instability and trunk sway with no LOB  Airex: horizontal head turns 10x, vertical head turns 10x no LOB  6" step toe taps. Single UE support  Step over and back orange hurdle 12x each leg SUE support. Did not knock over orange hurdle.   Sit to stand transfers : cueing to bring right foot slightly behind left shoulder width apart, push off with hands on knees, and obtain full standing position. Stand to sit with right hand reaching back for chair to prevent left low back spasm. Patient encouraged to implement placing right foot behind left to initiate weight shift to  RLE for equal weight bearing.    Resisted knee flexion seated in front of mirror with cues for upright posture 20x  Seated knee extension passing soccer ball to PT. 20x      Hamstring stretch 2x 60 seconds on small step seated.     Pt. response to medical necessity: . Patient will continue to benefit from skilled physical therapy to improve strength and balance                        PT Education - 08/10/17 0935    Education provided  Yes    Education Details  HEP compliance, STS transfer, mobility and balance    Person(s) Educated  Patient    Methods  Explanation;Demonstration;Verbal cues    Comprehension  Verbalized understanding;Returned demonstration       PT Short Term Goals - 07/16/17 1320      PT SHORT TERM GOAL #1   Title  Patient will increase 10 meter walk test to >1.27m/s  as to improve gait speed for better community ambulation and to reduce fall risk.    Baseline  12/14: .9 m/s     Time  2    Period  Weeks    Status  New    Target Date  07/30/17      PT SHORT TERM GOAL #2   Title  Patient will be independent in bending down towards floor and picking up small object (<5 pounds) and then stand back up without loss of balance as to improve ability to pick up and clean up room at home    Baseline  require supervision due to poor coordination/control of concentric and eccentric motion of squat     Time  2    Period  Weeks    Status  New    Target Date  07/30/17      PT SHORT TERM GOAL #3   Title  Patient will tolerate 5 seconds of single leg stance without loss of balance to improve ability to get in and out of shower safely.    Baseline  12/14: unable to perform     Time  2    Period  Weeks    Status  New    Target Date  07/30/17        PT Long Term Goals - 07/16/17 1322      PT LONG TERM GOAL #1   Title  Patient will increase Berg Balance score by > 6 points (44/56) to demonstrate decreased fall risk during functional activities.    Baseline  12/14: 38/56    Time  8    Period  Weeks    Status  New    Target Date  09/10/17      PT LONG TERM GOAL #2   Title  Patient (> 63 years old) will complete five times sit to stand test in < 15 seconds indicating an increased LE strength and improved balance.    Baseline  12/14: 23 seconds    Time  8    Period  Weeks    Status  New    Target Date  09/10/17      PT LONG TERM GOAL #3   Title  Patient will increase ABC scale score >60% to demonstrate better functional mobility and better confidence with ADLs    Baseline  12/14: 36.9%    Time  8    Period  Weeks    Status  New    Target Date  09/10/17      PT LONG TERM GOAL #4   Title  Patient will increase lower extremity functional scale to >50/80 to demonstrate improved functional mobility and increased tolerance with ADLs.     Baseline  12/14:  21/80    Time  8    Period  Weeks    Status  New    Target Date  09/10/17      PT LONG TERM GOAL #5   Title  Patient will increase BLE gross strength to 4+/5 as to improve functional strength for independent gait, increased standing tolerance and increased ADL ability.    Baseline  12/14: 2+/5     Time  8    Period  Weeks    Status  New    Target Date  09/10/17            Plan - 08/10/17 1137    Clinical Impression Statement  Patient progressing with ability to lift bilateral legs, RLE weakness limits ability to maintain SLS for gait and balance interventions. Tight hamstrings bilaterally shift hips alignment causing low back pain affecting patient's ambulation. Patient will continue to benefit from skilled physical therapy to improve strength and balance    Rehab Potential  Fair    Clinical Impairments Affecting Rehab Potential  (-) unstable cardiac condition, limited vision in R eye, poor short term memory, (+) good family support, motivated to improve     PT Frequency  2x / week    PT Duration  8 weeks    PT Treatment/Interventions  ADLs/Self Care Home Management;Aquatic Therapy;Biofeedback;Cryotherapy;Electrical Stimulation;Traction;Ultrasound;Moist Heat;Iontophoresis 4mg /ml Dexamethasone;DME Instruction;Gait training;Stair training;Functional mobility training;Therapeutic activities;Therapeutic exercise;Patient/family education;Orthotic Fit/Training;Neuromuscular re-education;Balance training;Manual techniques;Taping;Energy conservation;Passive range of motion;Vestibular;Visual/perceptual remediation/compensation    PT Next Visit Plan  review HEP, strengthen hip flexors, practice stepping up.     PT Home Exercise Plan  see sheet    Consulted and Agree with Plan of Care  Patient;Family member/caregiver    Family Member Consulted  wife       Patient will benefit from skilled therapeutic intervention in order to improve the following deficits and impairments:  Abnormal gait,  Decreased activity tolerance, Decreased balance, Decreased knowledge of precautions, Decreased endurance, Decreased coordination, Decreased cognition, Decreased knowledge of use of DME, Decreased mobility, Decreased range of motion, Decreased safety awareness, Difficulty walking, Decreased strength, Impaired flexibility, Impaired perceived functional ability, Impaired sensation, Impaired UE functional use, Impaired vision/preception, Postural dysfunction, Improper body mechanics, Pain  Visit Diagnosis: Muscle weakness (generalized)  Other abnormalities of gait and mobility  Other lack of coordination     Problem List Patient Active Problem List   Diagnosis Date Noted  . PVD (peripheral vascular disease) ([place]) 07/15/2017  . Stroke ([place]) 06/21/2017  . Hypertension 01/03/2016  . Memory loss 09/27/2015  . 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 Diabetic peripheral neuropathy ([place]) 05/16/2015  . History of stroke with residual deficit 04/01/2015  . Chronic headaches 03/15/2015  . Tobacco abuse 01/24/2015  . Hyperlipidemia 01/21/2015  . Benign hypertensive renal disease 01/21/2015   Janna Arch, PT, DPT   Janna Arch 08/10/2017, 11:38 AM  [place] MAIN [facility] SERVICES [address] [place], [place], [address] Phone: [phone]   Fax:  [phone]  Name: Reynol Arnone. MRN: [id number] Date of Birth: Apr 09, 1956

## 2017-08-13 ENCOUNTER — Ambulatory Visit: Payer: Medicare HMO

## 2017-08-13 ENCOUNTER — Telehealth: Payer: Self-pay

## 2017-08-13 DIAGNOSIS — M6281 Muscle weakness (generalized): Secondary | ICD-10-CM | POA: Diagnosis not present

## 2017-08-13 DIAGNOSIS — R2689 Other abnormalities of gait and mobility: Secondary | ICD-10-CM | POA: Diagnosis not present

## 2017-08-13 DIAGNOSIS — R278 Other lack of coordination: Secondary | ICD-10-CM

## 2017-08-13 NOTE — Telephone Encounter (Signed)
Patient has hx with Dr. Clayborn Bigness

## 2017-08-13 NOTE — Therapy (Signed)
[place] MAIN [facility] SERVICES [address] [place], [place], [address] Phone: [phone]   Fax:  [phone]  Physical Therapy Treatment  Patient Details  Name: [name]. MRN: [id number] Date of Birth: 1955/10/06 Referring Provider: [name], OD   Encounter Date: 08/13/2017  PT End of Session - 08/13/17 1007    Visit Number  6    Number of Visits  16    Date for PT Re-Evaluation  09/10/17    Authorization - Visit Number  6    Authorization - Number of Visits  10    PT Start Time  1000    PT Stop Time  1045    PT Time Calculation (min)  45 min    Equipment Utilized During Treatment  Gait belt    Activity Tolerance  Patient tolerated treatment well    Behavior During Therapy  [facility] for tasks assessed/performed;Impulsive       Past Medical History:  Diagnosis Date  . Diabetes ([place])   . History of hernia repair   . Hypertension   . Stroke [facility])     Past Surgical History:  Procedure Laterality Date  . APPENDECTOMY    . HERNIA REPAIR      There were no vitals filed for this visit.  Subjective Assessment - 08/13/17 1005    Subjective  Patient reports one fall since last session. Stubbed toe on step when stepping up to get onto porch and fell forward. Was not hurt.     Patient is accompained by:  Family member    Pertinent History   Patient released from hospital 17 days ago. 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  Presents with weakness of R leg. History of memory deficits from stroke 2.5 years ago. 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 Has a cane but refuses to use it.     Limitations  Walking;Standing;House hold activities;Other (comment)    How long can you sit comfortably?  2-3 hours    How long can you stand comfortably?  15-20 minutes    How long can you walk comfortably?  unsteady with walking      Patient Stated Goals  get better at doing stairs, walk better, be more steady.     Currently in Pain?  No/denies        Nustep Lvl 4 4 minutes   Stair negotiation with BUE support : up with the good (LLE), down with the bad (RLE), required cueing first time, did independently second  Sit to stand practice, sitting back down with R hand reaching for chair  BUE step over orange hurdle 10x each leg. Only knocked hurdle over one time.   BUE side step over orange hurdle 10x each leg.    Airex pad: static balance 60 second   Airex pad: toss balls into bucket 20x , occasional posterior trunk lean, no LOB  Standing extension 10x BUE support BLE  Standing flexion straight leg 10x BUE support BLE  Standing abduction 10x BUE support BLE   Pt. response to medical necessity: . Patient will continue to benefit from skilled physical therapy to improve strength and balance        PT Education - 08/13/17 1006    Education provided  Yes    Education Details  stair negotiations , STS transfer, mobility and balance  Person(s) Educated  Patient;Spouse    Methods  Explanation;Demonstration;Verbal cues    Comprehension  Verbalized understanding;Returned demonstration;Verbal cues required       PT Short Term Goals - 07/16/17 1320      PT SHORT TERM GOAL #1   Title  Patient will increase 10 meter walk test to >1.53m/s as to improve gait speed for better community ambulation and to reduce fall risk.    Baseline  12/14: .9 m/s     Time  2    Period  Weeks    Status  New    Target Date  07/30/17      PT SHORT TERM GOAL #2   Title  Patient will be independent in bending down towards floor and picking up small object (<5 pounds) and then stand back up without loss of balance as to improve ability to pick up and clean up room at home    Baseline  require supervision due to poor coordination/control of concentric and eccentric motion of squat     Time  2    Period  Weeks    Status  New     Target Date  07/30/17      PT SHORT TERM GOAL #3   Title  Patient will tolerate 5 seconds of single leg stance without loss of balance to improve ability to get in and out of shower safely.    Baseline  12/14: unable to perform     Time  2    Period  Weeks    Status  New    Target Date  07/30/17        PT Long Term Goals - 07/16/17 1322      PT LONG TERM GOAL #1   Title  Patient will increase Berg Balance score by > 6 points (44/56) to demonstrate decreased fall risk during functional activities.    Baseline  12/14: 38/56    Time  8    Period  Weeks    Status  New    Target Date  09/10/17      PT LONG TERM GOAL #2   Title  Patient (> 82 years old) will complete five times sit to stand test in < 15 seconds indicating an increased LE strength and improved balance.    Baseline  12/14: 23 seconds    Time  8    Period  Weeks    Status  New    Target Date  09/10/17      PT LONG TERM GOAL #3   Title  Patient will increase ABC scale score >60% to demonstrate better functional mobility and better confidence with ADLs    Baseline  12/14: 36.9%    Time  8    Period  Weeks    Status  New    Target Date  09/10/17      PT LONG TERM GOAL #4   Title  Patient will increase lower extremity functional scale to >50/80 to demonstrate improved functional mobility and increased tolerance with ADLs.     Baseline  12/14: 21/80    Time  8    Period  Weeks    Status  New    Target Date  09/10/17      PT LONG TERM GOAL #5   Title  Patient will increase BLE gross strength to 4+/5 as to improve functional strength for independent gait, increased standing tolerance and increased ADL ability.    Baseline  12/14: 2+/5  Time  8    Period  Weeks    Status  New    Target Date  09/10/17            Plan - 08/13/17 1046    Clinical Impression Statement  Patient and wife educated on safe stair negotiations to prevent future LOB for patient. Patient demonstrating improved RLE strength,  however its limitations continue to challenge patients ambulation, negotiation of obstacles, and standing capacity.  Patient will continue to benefit from skilled physical therapy to improve strength and balance    Rehab Potential  Fair    Clinical Impairments Affecting Rehab Potential  (-) unstable cardiac condition, limited vision in R eye, poor short term memory, (+) good family support, motivated to improve     PT Frequency  2x / week    PT Duration  8 weeks    PT Treatment/Interventions  ADLs/Self Care Home Management;Aquatic Therapy;Biofeedback;Cryotherapy;Electrical Stimulation;Traction;Ultrasound;Moist Heat;Iontophoresis 4mg /ml Dexamethasone;DME Instruction;Gait training;Stair training;Functional mobility training;Therapeutic activities;Therapeutic exercise;Patient/family education;Orthotic Fit/Training;Neuromuscular re-education;Balance training;Manual techniques;Taping;Energy conservation;Passive range of motion;Vestibular;Visual/perceptual remediation/compensation    PT Next Visit Plan  review HEP, strengthen hip flexors, practice stepping up.     PT Home Exercise Plan  see sheet    Consulted and Agree with Plan of Care  Patient;Family member/caregiver    Family Member Consulted  wife       Patient will benefit from skilled therapeutic intervention in order to improve the following deficits and impairments:  Abnormal gait, Decreased activity tolerance, Decreased balance, Decreased knowledge of precautions, Decreased endurance, Decreased coordination, Decreased cognition, Decreased knowledge of use of DME, Decreased mobility, Decreased range of motion, Decreased safety awareness, Difficulty walking, Decreased strength, Impaired flexibility, Impaired perceived functional ability, Impaired sensation, Impaired UE functional use, Impaired vision/preception, Postural dysfunction, Improper body mechanics, Pain  Visit Diagnosis: Muscle weakness (generalized)  Other abnormalities of gait and  mobility  Other lack of coordination     Problem List Patient Active Problem List   Diagnosis Date Noted  . PVD (peripheral vascular disease) ([place]) 07/15/2017  . Stroke ([place]) 06/21/2017  . Hypertension 01/03/2016  . Memory loss 09/27/2015  . 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 Diabetic peripheral neuropathy ([place]) 05/16/2015  . History of stroke with residual deficit 04/01/2015  . Chronic headaches 03/15/2015  . Tobacco abuse 01/24/2015  . Hyperlipidemia 01/21/2015  . Benign hypertensive renal disease 01/21/2015  Janna Arch, PT, DPT    Janna Arch 08/13/2017, 10:46 AM  [place] MAIN [facility] SERVICES [address] [place], [place], [address] Phone: [phone]   Fax:  [phone]  Name: Shanna Strength. MRN: [id number] Date of Birth: Mar 11, 1956

## 2017-08-18 ENCOUNTER — Ambulatory Visit: Payer: Medicare HMO

## 2017-08-18 DIAGNOSIS — R278 Other lack of coordination: Secondary | ICD-10-CM | POA: Diagnosis not present

## 2017-08-18 DIAGNOSIS — R2689 Other abnormalities of gait and mobility: Secondary | ICD-10-CM | POA: Diagnosis not present

## 2017-08-18 DIAGNOSIS — M6281 Muscle weakness (generalized): Secondary | ICD-10-CM | POA: Diagnosis not present

## 2017-08-18 NOTE — Therapy (Signed)
[place] MAIN [facility] SERVICES [address] [place], [place], [address] Phone: [phone]   Fax:  [phone]  Physical Therapy Treatment  Patient Details  Name: [name]. MRN: [id number] Date of Birth: August 01, 1956 Referring Provider: [name], OD   Encounter Date: 08/18/2017  PT End of Session - 08/18/17 1019    Visit Number  7    Number of Visits  16    Date for PT Re-Evaluation  09/10/17    PT Start Time  8786    PT Stop Time  1115    PT Time Calculation (min)  46 min    Equipment Utilized During Treatment  Gait belt    Activity Tolerance  Patient tolerated treatment well    Behavior During Therapy  [facility] for tasks assessed/performed;Impulsive       Past Medical History:  Diagnosis Date  . Diabetes ([place])   . History of hernia repair   . Hypertension   . Stroke [facility])     Past Surgical History:  Procedure Laterality Date  . APPENDECTOMY    . HERNIA REPAIR      There were no vitals filed for this visit.  Subjective Assessment - 08/18/17 1033    Subjective  No falls since last session. Have been walking to mailbox everyday except sunday. Reports not being compliant with HEP.     Patient is accompained by:  Family member    Pertinent History   Patient released from hospital 17 days ago. 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  Presents with weakness of R leg. History of memory deficits from stroke 2.5 years ago. 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 Has a cane but refuses to use it.     Limitations  Walking;Standing;House hold activities;Other (comment)    How long can you sit comfortably?  2-3 hours    How long can you stand comfortably?  15-20 minutes    How long can you walk comfortably?  unsteady with walking     Patient Stated Goals  get better at doing stairs, walk better, be more steady.     Currently in Pain?  No/denies      Heel raises Toe raises   Walk in // bars over two half foam rollers with focus on keeping gaze at an angle (looking at chair) to decrease looking at feet.   Airex pad: eye spy   2lb ankle weights   Seated: tall posture: marching 2x10 (challenged by RLE due to weakness and difficulty connecting)   Seated: tall posture: LAQ; 2x10   Seated:   Adduction ball squeezes 2x10 with 3 second holds   Seated adduction squeeze ball between ankles with LAQ 5x  Resisted df RTB 12x   Resisted pf RTB 12x                      PT Education - 08/18/17 1118    Education provided  Yes    Education Details  HEP compliance, mobility and strength     Person(s) Educated  Patient    Methods  Explanation;Demonstration;Verbal cues    Comprehension  Verbalized understanding;Returned demonstration       PT Short Term Goals - 07/16/17 1320      PT SHORT TERM GOAL #1   Title  Patient will increase 10 meter walk test to >  1.39m/s as to improve gait speed for better community ambulation and to reduce fall risk.    Baseline  12/14: .9 m/s     Time  2    Period  Weeks    Status  New    Target Date  07/30/17      PT SHORT TERM GOAL #2   Title  Patient will be independent in bending down towards floor and picking up small object (<5 pounds) and then stand back up without loss of balance as to improve ability to pick up and clean up room at home    Baseline  require supervision due to poor coordination/control of concentric and eccentric motion of squat     Time  2    Period  Weeks    Status  New    Target Date  07/30/17      PT SHORT TERM GOAL #3   Title  Patient will tolerate 5 seconds of single leg stance without loss of balance to improve ability to get in and out of shower safely.    Baseline  12/14: unable to perform     Time  2    Period  Weeks    Status  New    Target Date  07/30/17        PT Long Term Goals - 07/16/17 1322      PT LONG  TERM GOAL #1   Title  Patient will increase Berg Balance score by > 6 points (44/56) to demonstrate decreased fall risk during functional activities.    Baseline  12/14: 38/56    Time  8    Period  Weeks    Status  New    Target Date  09/10/17      PT LONG TERM GOAL #2   Title  Patient (> 53 years old) will complete five times sit to stand test in < 15 seconds indicating an increased LE strength and improved balance.    Baseline  12/14: 23 seconds    Time  8    Period  Weeks    Status  New    Target Date  09/10/17      PT LONG TERM GOAL #3   Title  Patient will increase ABC scale score >60% to demonstrate better functional mobility and better confidence with ADLs    Baseline  12/14: 36.9%    Time  8    Period  Weeks    Status  New    Target Date  09/10/17      PT LONG TERM GOAL #4   Title  Patient will increase lower extremity functional scale to >50/80 to demonstrate improved functional mobility and increased tolerance with ADLs.     Baseline  12/14: 21/80    Time  8    Period  Weeks    Status  New    Target Date  09/10/17      PT LONG TERM GOAL #5   Title  Patient will increase BLE gross strength to 4+/5 as to improve functional strength for independent gait, increased standing tolerance and increased ADL ability.    Baseline  12/14: 2+/5     Time  8    Period  Weeks    Status  New    Target Date  09/10/17            Plan - 08/18/17 1120    Clinical Impression Statement  Patient RLE strength limits functional mobility. Seated strengthening interventions  implemented with patient requiring occasional tactile stimulation for muscle contraction to allow for increased range of strengthening. Patient and wife educated on need for HEP compliance. Patient will continue to benefit from skilled physical therapy to improve strength and balance.    Rehab Potential  Fair    Clinical Impairments Affecting Rehab Potential  (-) unstable cardiac condition, limited vision in R eye,  poor short term memory, (+) good family support, motivated to improve     PT Frequency  2x / week    PT Duration  8 weeks    PT Treatment/Interventions  ADLs/Self Care Home Management;Aquatic Therapy;Biofeedback;Cryotherapy;Electrical Stimulation;Traction;Ultrasound;Moist Heat;Iontophoresis 4mg /ml Dexamethasone;DME Instruction;Gait training;Stair training;Functional mobility training;Therapeutic activities;Therapeutic exercise;Patient/family education;Orthotic Fit/Training;Neuromuscular re-education;Balance training;Manual techniques;Taping;Energy conservation;Passive range of motion;Vestibular;Visual/perceptual remediation/compensation    PT Next Visit Plan  review HEP, strengthen hip flexors, practice stepping up.     PT Home Exercise Plan  see sheet    Consulted and Agree with Plan of Care  Patient;Family member/caregiver    Family Member Consulted  wife       Patient will benefit from skilled therapeutic intervention in order to improve the following deficits and impairments:  Abnormal gait, Decreased activity tolerance, Decreased balance, Decreased knowledge of precautions, Decreased endurance, Decreased coordination, Decreased cognition, Decreased knowledge of use of DME, Decreased mobility, Decreased range of motion, Decreased safety awareness, Difficulty walking, Decreased strength, Impaired flexibility, Impaired perceived functional ability, Impaired sensation, Impaired UE functional use, Impaired vision/preception, Postural dysfunction, Improper body mechanics, Pain  Visit Diagnosis: Muscle weakness (generalized)  Other abnormalities of gait and mobility  Other lack of coordination     Problem List Patient Active Problem List   Diagnosis Date Noted  . PVD (peripheral vascular disease) ([place]) 07/15/2017  . Stroke ([place]) 06/21/2017  . Hypertension 01/03/2016  . Memory loss 09/27/2015  . 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) 05/16/2015  .  Diabetic peripheral neuropathy ([place]) 05/16/2015  . History of stroke with residual deficit 04/01/2015  . Chronic headaches 03/15/2015  . Tobacco abuse 01/24/2015  . Hyperlipidemia 01/21/2015  . Benign hypertensive renal disease 01/21/2015  Janna Arch, PT, DPT    Janna Arch 08/18/2017, 11:21 AM  [place] MAIN [facility] SERVICES [address] [place], [place], [address] Phone: [phone]   Fax:  [phone]  Name: Leslie Langille. MRN: [id number] Date of Birth: [birth year]

## 2017-08-19 ENCOUNTER — Ambulatory Visit: Payer: Medicare HMO

## 2017-08-20 ENCOUNTER — Ambulatory Visit: Payer: Medicare HMO

## 2017-08-20 ENCOUNTER — Other Ambulatory Visit: Payer: Self-pay | Admitting: Family Medicine

## 2017-08-20 ENCOUNTER — Encounter: Payer: Self-pay | Admitting: Family Medicine

## 2017-08-20 ENCOUNTER — Ambulatory Visit (INDEPENDENT_AMBULATORY_CARE_PROVIDER_SITE_OTHER): Payer: Medicare HMO | Admitting: Family Medicine

## 2017-08-20 VITALS — BP 146/83 | HR 90 | Temp 98.4°F | Ht 69.6 in | Wt 187.0 lb

## 2017-08-20 DIAGNOSIS — R69 Illness, unspecified: Secondary | ICD-10-CM | POA: Diagnosis not present

## 2017-08-20 DIAGNOSIS — R278 Other lack of coordination: Secondary | ICD-10-CM | POA: Diagnosis not present

## 2017-08-20 DIAGNOSIS — G621 Alcoholic polyneuropathy: Secondary | ICD-10-CM

## 2017-08-20 DIAGNOSIS — M6281 Muscle weakness (generalized): Secondary | ICD-10-CM

## 2017-08-20 DIAGNOSIS — I739 Peripheral vascular disease, unspecified: Secondary | ICD-10-CM

## 2017-08-20 DIAGNOSIS — I129 Hypertensive chronic kidney disease with stage 1 through stage 4 chronic kidney disease, or unspecified chronic kidney disease: Secondary | ICD-10-CM | POA: Diagnosis not present

## 2017-08-20 DIAGNOSIS — I480 Paroxysmal atrial fibrillation: Secondary | ICD-10-CM | POA: Diagnosis not present

## 2017-08-20 DIAGNOSIS — Z125 Encounter for screening for malignant neoplasm of prostate: Secondary | ICD-10-CM

## 2017-08-20 DIAGNOSIS — H53461 Homonymous bilateral field defects, right side: Secondary | ICD-10-CM | POA: Diagnosis not present

## 2017-08-20 DIAGNOSIS — Z720 Tobacco use: Secondary | ICD-10-CM

## 2017-08-20 DIAGNOSIS — E1142 Type 2 diabetes mellitus with diabetic polyneuropathy: Secondary | ICD-10-CM | POA: Diagnosis not present

## 2017-08-20 DIAGNOSIS — R2689 Other abnormalities of gait and mobility: Secondary | ICD-10-CM

## 2017-08-20 DIAGNOSIS — Z1211 Encounter for screening for malignant neoplasm of colon: Secondary | ICD-10-CM

## 2017-08-20 DIAGNOSIS — I4821 Permanent atrial fibrillation: Secondary | ICD-10-CM | POA: Insufficient documentation

## 2017-08-20 DIAGNOSIS — Z0000 Encounter for general adult medical examination without abnormal findings: Secondary | ICD-10-CM

## 2017-08-20 DIAGNOSIS — Z0001 Encounter for general adult medical examination with abnormal findings: Secondary | ICD-10-CM | POA: Diagnosis not present

## 2017-08-20 DIAGNOSIS — I6930 Unspecified sequelae of cerebral infarction: Secondary | ICD-10-CM

## 2017-08-20 DIAGNOSIS — Z136 Encounter for screening for cardiovascular disorders: Secondary | ICD-10-CM

## 2017-08-20 DIAGNOSIS — E782 Mixed hyperlipidemia: Secondary | ICD-10-CM | POA: Diagnosis not present

## 2017-08-20 DIAGNOSIS — R413 Other amnesia: Secondary | ICD-10-CM

## 2017-08-20 DIAGNOSIS — I4891 Unspecified atrial fibrillation: Secondary | ICD-10-CM | POA: Insufficient documentation

## 2017-08-20 LAB — UA/M W/RFLX CULTURE, ROUTINE
Bilirubin, UA: NEGATIVE
KETONES UA: NEGATIVE
LEUKOCYTES UA: NEGATIVE
NITRITE UA: NEGATIVE
PH UA: 7 (ref 5.0–7.5)
Protein, UA: NEGATIVE
RBC UA: NEGATIVE
SPEC GRAV UA: 1.01 (ref 1.005–1.030)
UUROB: 0.2 mg/dL (ref 0.2–1.0)

## 2017-08-20 LAB — MICROALBUMIN, URINE WAIVED
Creatinine, Urine Waived: 10 mg/dL (ref 10–300)
Microalb, Ur Waived: 10 mg/L (ref 0–19)

## 2017-08-20 LAB — BAYER DCA HB A1C WAIVED: HB A1C (BAYER DCA - WAIVED): 6.6 % (ref <7.0)

## 2017-08-20 MED ORDER — DILTIAZEM HCL ER COATED BEADS 240 MG PO CP24: 240.0000 mg | ORAL_CAPSULE | Freq: Every day | ORAL | 1 refills | Status: DC

## 2017-08-20 MED ORDER — APIXABAN 5 MG PO TABS: 5.0000 mg | ORAL_TABLET | Freq: Two times a day (BID) | ORAL | 1 refills | Status: DC

## 2017-08-20 MED ORDER — ASPIRIN 81 MG PO TBEC: 81.0000 mg | DELAYED_RELEASE_TABLET | Freq: Every day | ORAL | 4 refills | Status: DC

## 2017-08-20 NOTE — Assessment & Plan Note (Signed)
Continue to monitor. Call with any concerns. Continue PT to help with weakness.

## 2017-08-20 NOTE — Assessment & Plan Note (Signed)
Stable. Continue to monitor. Call with any concerns. Continue current regimen.

## 2017-08-20 NOTE — Assessment & Plan Note (Signed)
Under good control with A1c of 6.6. Continue to monitor. Call with any concerns. Continue current regimen.

## 2017-08-20 NOTE — Assessment & Plan Note (Signed)
Continue to follow with cardiology. Continue anticoagulation. Continue diltiazem. Call with any concerns.

## 2017-08-20 NOTE — Therapy (Signed)
[place] MAIN [facility] SERVICES [address] [place], [place], [address] Phone: [phone]   Fax:  [phone]  Physical Therapy Treatment  Patient Details  Name: [name]. MRN: [id number] Date of Birth: 1956/01/13 Referring Provider: [name], OD   Encounter Date: 08/20/2017  PT End of Session - 08/20/17 0956    Visit Number  8    Number of Visits  16    Date for PT Re-Evaluation  09/10/17    PT Start Time  0959    PT Stop Time  1045    PT Time Calculation (min)  46 min    Equipment Utilized During Treatment  Gait belt    Activity Tolerance  Patient tolerated treatment well    Behavior During Therapy  [facility] for tasks assessed/performed;Impulsive       Past Medical History:  Diagnosis Date  . Diabetes ([place])   . History of hernia repair   . Hypertension   . Stroke [facility])     Past Surgical History:  Procedure Laterality Date  . APPENDECTOMY    . HERNIA REPAIR      There were no vitals filed for this visit.  Subjective Assessment - 08/20/17 1003    Subjective  Patient reports not performing HEP, has been walking to mailbox and walked from parking lot today prior to session. Reports no stumbles or falls.     Patient is accompained by:  Family member    Pertinent History   Patient released from hospital 17 days ago. 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  Presents with weakness of R leg. History of memory deficits from stroke 2.5 years ago. 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 Has a cane but refuses to use it.     Limitations  Walking;Standing;House hold activities;Other (comment)    How long can you sit comfortably?  2-3 hours    How long can you stand comfortably?  15-20 minutes    How long can you walk comfortably?  unsteady with walking     Patient Stated Goals  get better at doing stairs, walk  better, be more steady.     Currently in Pain?  No/denies      Nustep Lvl 3 4 minutes  Heel raises Toe raises      Airex pad: weighted ball vertical raises 10x, cross body d2 pattern 10x each way- cues for exhaling with ball raise   Airex pad: ball toss with wife, CGA   2lb ankle weights              Seated: tall posture: marching 2x10 (challenged by RLE due to weakness and difficulty connecting)              Seated: tall posture: LAQ; 2x10                Seated : abduction side step to step on PT foot.  Seated:              Adduction ball squeezes 2x10 with 3 second holds              Seated adduction squeeze ball between ankles with LAQ 10x                  Education on need for compliance with HEP for progression.      Pt.  response to medical necessity:  Patient will continue to benefit from skilled physical therapy to improve strength and balance.              PT Education - 08/20/17 0954    Education provided  Yes    Education Details  HEP compliance, mobility, strength    Person(s) Educated  Patient    Methods  Explanation;Demonstration;Verbal cues    Comprehension  Verbalized understanding;Returned demonstration       PT Short Term Goals - 07/16/17 1320      PT SHORT TERM GOAL #1   Title  Patient will increase 10 meter walk test to >1.96m/s as to improve gait speed for better community ambulation and to reduce fall risk.    Baseline  12/14: .9 m/s     Time  2    Period  Weeks    Status  New    Target Date  07/30/17      PT SHORT TERM GOAL #2   Title  Patient will be independent in bending down towards floor and picking up small object (<5 pounds) and then stand back up without loss of balance as to improve ability to pick up and clean up room at home    Baseline  require supervision due to poor coordination/control of concentric and eccentric motion of squat     Time  2    Period  Weeks    Status  New    Target Date  07/30/17      PT SHORT  TERM GOAL #3   Title  Patient will tolerate 5 seconds of single leg stance without loss of balance to improve ability to get in and out of shower safely.    Baseline  12/14: unable to perform     Time  2    Period  Weeks    Status  New    Target Date  07/30/17        PT Long Term Goals - 07/16/17 1322      PT LONG TERM GOAL #1   Title  Patient will increase Berg Balance score by > 6 points (44/56) to demonstrate decreased fall risk during functional activities.    Baseline  12/14: 38/56    Time  8    Period  Weeks    Status  New    Target Date  09/10/17      PT LONG TERM GOAL #2   Title  Patient (> 72 years old) will complete five times sit to stand test in < 15 seconds indicating an increased LE strength and improved balance.    Baseline  12/14: 23 seconds    Time  8    Period  Weeks    Status  New    Target Date  09/10/17      PT LONG TERM GOAL #3   Title  Patient will increase ABC scale score >60% to demonstrate better functional mobility and better confidence with ADLs    Baseline  12/14: 36.9%    Time  8    Period  Weeks    Status  New    Target Date  09/10/17      PT LONG TERM GOAL #4   Title  Patient will increase lower extremity functional scale to >50/80 to demonstrate improved functional mobility and increased tolerance with ADLs.     Baseline  12/14: 21/80    Time  8    Period  Weeks  Status  New    Target Date  09/10/17      PT LONG TERM GOAL #5   Title  Patient will increase BLE gross strength to 4+/5 as to improve functional strength for independent gait, increased standing tolerance and increased ADL ability.    Baseline  12/14: 2+/5     Time  8    Period  Weeks    Status  New    Target Date  09/10/17            Plan - 08/20/17 1029    Clinical Impression Statement  Patient was educated on need for compliance with HEP for progression of function. Patient and wife agreed to compliance. Patient demonstrated improved dynamic stability with  ball toss and weighted ball lifts. RLE weakness affects seated strengthening with decreased range of functional strength. Patient will continue to benefit from skilled physical therapy to improve strength and balance.     Rehab Potential  Fair    Clinical Impairments Affecting Rehab Potential  (-) unstable cardiac condition, limited vision in R eye, poor short term memory, (+) good family support, motivated to improve     PT Frequency  2x / week    PT Duration  8 weeks    PT Treatment/Interventions  ADLs/Self Care Home Management;Aquatic Therapy;Biofeedback;Cryotherapy;Electrical Stimulation;Traction;Ultrasound;Moist Heat;Iontophoresis 4mg /ml Dexamethasone;DME Instruction;Gait training;Stair training;Functional mobility training;Therapeutic activities;Therapeutic exercise;Patient/family education;Orthotic Fit/Training;Neuromuscular re-education;Balance training;Manual techniques;Taping;Energy conservation;Passive range of motion;Vestibular;Visual/perceptual remediation/compensation    PT Next Visit Plan  review HEP, strengthen hip flexors, practice stepping up.     PT Home Exercise Plan  see sheet    Consulted and Agree with Plan of Care  Patient;Family member/caregiver    Family Member Consulted  wife       Patient will benefit from skilled therapeutic intervention in order to improve the following deficits and impairments:  Abnormal gait, Decreased activity tolerance, Decreased balance, Decreased knowledge of precautions, Decreased endurance, Decreased coordination, Decreased cognition, Decreased knowledge of use of DME, Decreased mobility, Decreased range of motion, Decreased safety awareness, Difficulty walking, Decreased strength, Impaired flexibility, Impaired perceived functional ability, Impaired sensation, Impaired UE functional use, Impaired vision/preception, Postural dysfunction, Improper body mechanics, Pain  Visit Diagnosis: Muscle weakness (generalized)  Other abnormalities of gait  and mobility  Other lack of coordination     Problem List Patient Active Problem List   Diagnosis Date Noted  . PVD (peripheral vascular disease) ([place]) 07/15/2017  . Stroke ([place]) 06/21/2017  . Hypertension 01/03/2016  . Memory loss 09/27/2015  . 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 Diabetic peripheral neuropathy ([place]) 05/16/2015  . History of stroke with residual deficit 04/01/2015  . Chronic headaches 03/15/2015  . Tobacco abuse 01/24/2015  . Hyperlipidemia 01/21/2015  . Benign hypertensive renal disease 01/21/2015   Janna Arch, PT, DPT   Janna Arch 08/20/2017, 10:44 AM  [place] MAIN [facility] SERVICES [address] [place], [place], [address] Phone: [phone]   Fax:  [phone]  Name: [name]. MRN: [id number] Date of Birth: Jun 02, 1956

## 2017-08-20 NOTE — Assessment & Plan Note (Signed)
Stable. Continue current regimen. Continue to monitor. Continue current regimen.

## 2017-08-20 NOTE — Assessment & Plan Note (Signed)
Continue to follow with vascular. Call with any concerns. Stable.

## 2017-08-20 NOTE — Progress Notes (Signed)
BP (!) 146/83 (BP Location: Left Arm, Patient Position: Sitting, Cuff Size: Normal)   Pulse 90   Temp 98.4 F (36.9 C)   Ht 5' 9.6" (1.768 m)   Wt 187 lb (84.8 kg)   SpO2 98%   BMI 27.14 kg/m    Subjective:    Patient ID: Glenn Gerold., male    DOB: 1955-11-24, 62 y.o.   MRN: [id number]  HPI: Glenn Raver. is a 62 y.o. male presenting on 08/20/2017 for initial welcome to medicare comprehensive medical examination. Current medical complaints include:  DIABETES Hypoglycemic episodes:no Polydipsia/polyuria: no Visual disturbance: no Chest pain: no Paresthesias: no Glucose Monitoring: no Taking Insulin?: no Blood Pressure Monitoring: not checking Retinal Examination: Not up to Date Foot Exam: Up to Date Diabetic Education: Completed Pneumovax: Up to Date Influenza: Up to Date Aspirin: yes  HYPERTENSION / HYPERLIPIDEMIA Satisfied with current treatment? yes Duration of hypertension: chronic BP monitoring frequency: not checking BP medication side effects: no Duration of hyperlipidemia: chronic Cholesterol medication side effects: no Cholesterol supplements: none Past cholesterol medications: crestor Medication compliance: excellent compliance Aspirin: yes Recent stressors: no Recurrent headaches: no Visual changes: no Palpitations: no Dyspnea: no Chest pain: no Lower extremity edema: no Dizzy/lightheaded: no   He currently lives with: wife Interim Problems from his last visit: yes- stroke and diagnosed with a fib  Functional Status Survey: Is the patient deaf or have difficulty hearing?: No Does the patient have difficulty seeing, even when wearing glasses/contacts?: Yes Does the patient have difficulty concentrating, remembering, or making decisions?: Yes Does the patient have difficulty walking or climbing stairs?: Yes Does the patient have difficulty dressing or bathing?: Yes Does the patient have difficulty doing errands alone such as  visiting a doctor's office or shopping?: Yes  FALL RISK: Fall Risk  08/20/2017 05/20/2017  Falls in the past year? Yes No  Number falls in past yr: 1 -  Injury with Fall? No -  Risk for fall due to : Impaired balance/gait -  Follow up Falls evaluation completed -    Depression Screen Depression screen [facility] 2/9 08/20/2017 05/20/2017  Decreased Interest 0 0  Down, Depressed, Hopeless 0 0  PHQ - 2 Score 0 0    Advanced Directives Information given  Past Medical History:  Past Medical History:  Diagnosis Date  . Diabetes ([place])   . History of hernia repair   . Hypertension   . Stroke [facility])     Surgical History:  Past Surgical History:  Procedure Laterality Date  . APPENDECTOMY    . HERNIA REPAIR      Medications:  Current Outpatient Medications on File Prior to Visit  Medication Sig  . Ascorbic Acid (VITAMIN C) 100 MG tablet Take 100 mg by mouth daily.  . B Complex Vitamins (VITAMIN B COMPLEX PO) Take by mouth.  . ciclopirox (PENLAC) 8 % solution Apply over nail at Bedtime and surrounding skin. Apply daily over previous coat. After seven (7) days, may remove with alcohol and continue (Patient not taking: Reported on 06/21/2017)  . empagliflozin (JARDIANCE) 10 MG TABS tablet Take 10 mg by mouth daily. (Patient taking differently: Take 5 mg daily by mouth. )  . metFORMIN (GLUCOPHAGE) 1000 MG tablet Take 1 tablet (1,000 mg total) by mouth 2 (two) times daily with a meal.  . Multiple Vitamin (MULTIVITAMIN WITH MINERALS) TABS tablet Take 1 tablet by mouth daily.  Marland Kitchen oxyCODONE (OXYCONTIN) 10 mg 12 hr tablet Take 1 tablet (10 mg  total) by mouth every 12 (twelve) hours. (Patient taking differently: Take 10 mg by mouth every 12 (twelve) hours. )  . simvastatin (ZOCOR) 40 MG tablet TAKE 1 TABLET (40 MG TOTAL) BY MOUTH AT BEDTIME.   No current facility-administered medications on file prior to visit.     Allergies:  Allergies  Allergen Reactions  . Lipitor [Atorvastatin] Rash  .  Gabapentin Other (See Comments)    Other reaction(s): Dizziness    Social History:  Social History   Socioeconomic History  . Marital status: Married    Spouse name: Not on file  . Number of children: 0  . Years of education: Not on file  . Highest education level: Not on file  Social Needs  . Financial resource strain: Not on file  . Food insecurity - worry: Not on file  . Food insecurity - inability: Not on file  . Transportation needs - medical: Not on file  . Transportation needs - non-medical: Not on file  Occupational History  . Not on file  Tobacco Use  . Smoking status: Current Every Day Smoker    Packs/day: 0.50    Years: 30.00    Pack years: 15.00    Types: Cigarettes  . Smokeless tobacco: Never Used  Substance and Sexual Activity  . Alcohol use: Yes    Alcohol/week: 4.2 oz    Types: 7 Cans of beer per week    Comment: occasionally  . Drug use: No  . Sexual activity: Yes  Other Topics Concern  . Not on file  Social History Narrative  . Not on file   Social History   Tobacco Use  Smoking Status Current Every Day Smoker  . Packs/day: 0.50  . Years: 30.00  . Pack years: 15.00  . Types: Cigarettes  Smokeless Tobacco Never Used   Social History   Substance and Sexual Activity  Alcohol Use Yes  . Alcohol/week: 4.2 oz  . Types: 7 Cans of beer per week   Comment: occasionally    Family History:  Family History  Problem Relation Age of Onset  . Diabetes Father   . Hypertension Father   . Hyperlipidemia Father     Past medical history, surgical history, medications, allergies, family history and social history reviewed with patient today and changes made to appropriate areas of the chart.   Review of Systems  Constitutional: Negative.   HENT: Negative.   Eyes: Negative for blurred vision, double vision, photophobia, pain, discharge and redness.       Trouble seeing  Respiratory: Negative.   Cardiovascular: Negative.   Gastrointestinal:  Negative.   Genitourinary: Negative.   Musculoskeletal: Negative.   Skin: Negative.   Neurological: Positive for dizziness and headaches. Negative for tingling, tremors, sensory change, speech change, focal weakness, seizures and loss of consciousness.  Endo/Heme/Allergies: Negative for environmental allergies and polydipsia. Bruises/bleeds easily.  Psychiatric/Behavioral: Negative.     All other ROS negative except what is listed above and in the HPI.      Objective:    BP (!) 146/83 (BP Location: Left Arm, Patient Position: Sitting, Cuff Size: Normal)   Pulse 90   Temp 98.4 F (36.9 C)   Ht 5' 9.6" (1.768 m)   Wt 187 lb (84.8 kg)   SpO2 98%   BMI 27.14 kg/m   Wt Readings from Last 3 Encounters:  08/20/17 187 lb (84.8 kg)  07/20/17 190 lb (86.2 kg)  07/15/17 187 lb 8 oz (85 kg)  Hearing Screening   125Hz  250Hz  500Hz  1000Hz  2000Hz  3000Hz  4000Hz  6000Hz  8000Hz   Right ear:   40 40 40  40    Left ear:   40 40 40  Fail      Visual Acuity Screening   Right eye Left eye Both eyes  Without correction:     With correction: 20/30 20/25 20/20     Physical Exam  6CIT Screen 08/20/2017  What Year? 0 points  What month? 0 points  What time? 3 points  Count back from 20 0 points  Months in reverse 4 points  Repeat phrase 4 points  Total Score 11     Results for orders placed or performed in visit on 08/20/17  Bayer DCA Hb A1c Waived  Result Value Ref Range   Bayer DCA Hb A1c Waived 6.6 <7.0 %  Microalbumin, Urine Waived  Result Value Ref Range   Microalb, Ur Waived 10 0 - 19 mg/L   Creatinine, Urine Waived 10 10 - 300 mg/dL   Microalb/Creat Ratio 30-300 (H) <30 mg/g  UA/M w/rflx Culture, Routine  Result Value Ref Range   Specific Gravity, UA 1.010 1.005 - 1.030   pH, UA 7.0 5.0 - 7.5   Color, UA Yellow Yellow   Appearance Ur Clear Clear   Leukocytes, UA Negative Negative   Protein, UA Negative Negative/Trace   Glucose, UA 3+ (A) Negative   Ketones, UA Negative  Negative   RBC, UA Negative Negative   Bilirubin, UA Negative Negative   Urobilinogen, Ur 0.2 0.2 - 1.0 mg/dL   Nitrite, UA Negative Negative      Assessment & Plan:   Problem List Items Addressed This Visit      Cardiovascular and Mediastinum   PVD (peripheral vascular disease) ([place])    Continue to follow with vascular. Call with any concerns. Stable.       Relevant Medications   apixaban (ELIQUIS) 5 MG TABS tablet   aspirin 81 MG EC tablet   diltiazem (CARDIZEM CD) 240 MG 24 hr capsule   Other Relevant Orders   CBC with Differential/Platelet   Comprehensive metabolic panel   TSH   UA/M w/rflx Culture, Routine (Completed)   A-fib ([place])    Continue to follow with cardiology. Continue anticoagulation. Continue diltiazem. Call with any concerns.       Relevant Medications   apixaban (ELIQUIS) 5 MG TABS tablet   aspirin 81 MG EC tablet   diltiazem (CARDIZEM CD) 240 MG 24 hr capsule     Endocrine   Diabetic peripheral neuropathy ([name])    Under good control with A1c of 6.6. Continue to monitor. Call with any concerns. Continue current regimen.       Relevant Medications   aspirin 81 MG EC tablet   Other Relevant Orders   CBC with Differential/Platelet   Bayer DCA Hb A1c Waived (Completed)   Comprehensive metabolic panel   Microalbumin, Urine Waived (Completed)   TSH   UA/M w/rflx Culture, Routine (Completed)     Nervous and Auditory   Alcohol-induced polyneuropathy ([name])    Stable. Continue current regimen. Continue to monitor.         Genitourinary   Benign hypertensive renal disease    Stable. Continue to monitor. Call with any concerns. Continue current regimen.       Relevant Orders   CBC with Differential/Platelet   Comprehensive metabolic panel   TSH   UA/M w/rflx Culture, Routine (Completed)     Other   Hyperlipidemia  Stable. Continue current regimen. Continue to monitor. Continue current regimen.       Relevant Medications   apixaban  (ELIQUIS) 5 MG TABS tablet   aspirin 81 MG EC tablet   diltiazem (CARDIZEM CD) 240 MG 24 hr capsule   Other Relevant Orders   CBC with Differential/Platelet   Comprehensive metabolic panel   Lipid Panel w/o Chol/HDL Ratio   TSH   UA/M w/rflx Culture, Routine (Completed)   Tobacco abuse    Continue to work on cutting down. Call with any concerns.       Relevant Orders   CBC with Differential/Platelet   Comprehensive metabolic panel   TSH   UA/M w/rflx Culture, Routine (Completed)   History of stroke with residual deficit    Continue to monitor. Call with any concerns. Continue PT to help with weakness.      Relevant Orders   CBC with Differential/Platelet   Comprehensive metabolic panel   TSH   UA/M w/rflx Culture, Routine (Completed)   Right homonymous hemianopsia    Continue to monitor. Call with any concerns. Stable.       Relevant Orders   CBC with Differential/Platelet   Comprehensive metabolic panel   TSH   UA/M w/rflx Culture, Routine (Completed)   Memory loss    Continue to monitor. Call with any concerns. Stable.        Other Visit Diagnoses    Welcome to Medicare preventive visit    -  Primary   Preventative care discussed today as below.    Relevant Orders   EKG 12-Lead (Completed)   Screening for prostate cancer       Labs drawn today. Await results.    Relevant Orders   PSA   Screening for cardiovascular condition       EKG done today. Await results.    Relevant Orders   EKG 12-Lead (Completed)   Screening for colon cancer       Cologuard ordered. Await results.    Relevant Orders   Cologuard       Preventative Services:  Health Risk Assessment and Personalized Prevention Plan: Bone Mass Measurements: N/A CVD Screening: Done today Colon Cancer Screening: Cologuard ordered today Depression Screening: Done today Diabetes Screening: Done today Glaucoma Screening: See your eye doctor Hepatitis B vaccine: N/A Hepatitis C screening: Up to  date HIV Screening: Up to date Flu Vaccine: Up to date Lung cancer Screening: Up to date Obesity Screening: Done today Pneumonia Vaccines (2): Given today STI Screening: N/A PSA screening: Done today  Discussed aspirin prophylaxis for myocardial infarction prevention and decision was made to continue ASA  LABORATORY TESTING:  Health maintenance labs ordered today as discussed above.   The natural history of prostate cancer and ongoing controversy regarding screening and potential treatment outcomes of prostate cancer has been discussed with the patient. The meaning of a false positive PSA and a false negative PSA has been discussed. He indicates understanding of the limitations of this screening test and wishes to proceed with screening PSA testing.   IMMUNIZATIONS:  - Tdap: Tetanus vaccination status reviewed: last tetanus booster within 10 years. - Influenza: Up to date - Pneumovax: Up to date - Prevnar: Administered today - Zostavax vaccine: Up to date  SCREENING: - Colonoscopy: Cologuard ordered today  Discussed with patient purpose of the colonoscopy is to detect colon cancer at curable precancerous or early stages   - AAA Screening:Ordered today  -Hearing Test: Ordered today  -Spirometry: Not applicable  PATIENT COUNSELING:   Sexuality: Discussed sexually transmitted diseases, partner selection, use of condoms, avoidance of unintended pregnancy  and contraceptive alternatives.   Advised to avoid cigarette smoking.  I discussed with the patient that most people either abstain from alcohol or drink within safe limits (<=14/week and <=4 drinks/occasion for males, <=7/weeks and <= 3 drinks/occasion for females) and that the risk for alcohol disorders and other health effects rises proportionally with the number of drinks per week and how often a drinker exceeds daily limits.  Discussed cessation/primary prevention of drug use and availability of treatment for  abuse.   Diet: Encouraged to adjust caloric intake to maintain  or achieve ideal body weight, to reduce intake of dietary saturated fat and total fat, to limit sodium intake by avoiding high sodium foods and not adding table salt, and to maintain adequate dietary potassium and calcium preferably from fresh fruits, vegetables, and low-fat dairy products.    stressed the importance of regular exercise  Injury prevention: Discussed safety belts, safety helmets, smoke detector, smoking near bedding or upholstery.   Dental health: Discussed importance of regular tooth brushing, flossing, and dental visits.   Follow up plan: NEXT PREVENTATIVE PHYSICAL DUE IN 1 YEAR. No Follow-up on file.

## 2017-08-20 NOTE — Patient Instructions (Addendum)
Preventative Services:  Health Risk Assessment and Personalized Prevention Plan: Bone Mass Measurements: N/A CVD Screening: Done today Colon Cancer Screening: Cologuard ordered today Depression Screening: Done today Diabetes Screening: Done today Glaucoma Screening: See your eye doctor Hepatitis B vaccine: N/A Hepatitis C screening: Up to date HIV Screening: Up to date Flu Vaccine: Up to date Lung cancer Screening: Up to date Obesity Screening: Done today Pneumonia Vaccines (2): Given today STI Screening: N/A PSA screening: Done today   Health Maintenance, Male A healthy lifestyle and preventive care is important for your health and wellness. Ask your health care provider about what schedule of regular examinations is right for you. What should I know about weight and diet? Eat a Healthy Diet  Eat plenty of vegetables, fruits, whole grains, low-fat dairy products, and lean protein.  Do not eat a lot of foods high in solid fats, added sugars, or salt.  Maintain a Healthy Weight Regular exercise can help you achieve or maintain a healthy weight. You should:  Do at least 150 minutes of exercise each week. The exercise should increase your heart rate and make you sweat (moderate-intensity exercise).  Do strength-training exercises at least twice a week.  Watch Your Levels of Cholesterol and Blood Lipids  Have your blood tested for lipids and cholesterol every 5 years starting at 63 years of age. If you are at high risk for heart disease, you should start having your blood tested when you are 63 years old. You may need to have your cholesterol levels checked more often if: ? Your lipid or cholesterol levels are high. ? You are older than 63 years of age. ? You are at high risk for heart disease.  What should I know about cancer screening? Many types of cancers can be detected early and may often be prevented. Lung Cancer  You should be screened every year for lung cancer  if: ? You are a current smoker who has smoked for at least 30 years. ? You are a former smoker who has quit within the past 15 years.  Talk to your health care provider about your screening options, when you should start screening, and how often you should be screened.  Colorectal Cancer  Routine colorectal cancer screening usually begins at 62 years of age and should be repeated every 5-10 years until you are 63 years old. You may need to be screened more often if early forms of precancerous polyps or small growths are found. Your health care provider may recommend screening at an earlier age if you have risk factors for colon cancer.  Your health care provider may recommend using home test kits to check for hidden blood in the stool.  A small camera at the end of a tube can be used to examine your colon (sigmoidoscopy or colonoscopy). This checks for the earliest forms of colorectal cancer.  Prostate and Testicular Cancer  Depending on your age and overall health, your health care provider may do certain tests to screen for prostate and testicular cancer.  Talk to your health care provider about any symptoms or concerns you have about testicular or prostate cancer.  Skin Cancer  Check your skin from head to toe regularly.  Tell your health care provider about any new moles or changes in moles, especially if: ? There is a change in a mole's size, shape, or color. ? You have a mole that is larger than a pencil eraser.  Always use sunscreen. Apply sunscreen liberally and repeat  throughout the day.  Protect yourself by wearing long sleeves, pants, a wide-brimmed hat, and sunglasses when outside.  What should I know about heart disease, diabetes, and high blood pressure?  If you are 18-39 years of age, have your blood pressure checked every 3-5 years. If you are 40 years of age or older, have your blood pressure checked every year. You should have your blood pressure measured  twice-once when you are at a hospital or clinic, and once when you are not at a hospital or clinic. Record the average of the two measurements. To check your blood pressure when you are not at a hospital or clinic, you can use: ? An automated blood pressure machine at a pharmacy. ? A home blood pressure monitor.  Talk to your health care provider about your target blood pressure.  If you are between 45-79 years old, ask your health care provider if you should take aspirin to prevent heart disease.  Have regular diabetes screenings by checking your fasting blood sugar level. ? If you are at a normal weight and have a low risk for diabetes, have this test once every three years after the age of 45. ? If you are overweight and have a high risk for diabetes, consider being tested at a younger age or more often.  A one-time screening for abdominal aortic aneurysm (AAA) by ultrasound is recommended for men aged 65-75 years who are current or former smokers. What should I know about preventing infection? Hepatitis B If you have a higher risk for hepatitis B, you should be screened for this virus. Talk with your health care provider to find out if you are at risk for hepatitis B infection. Hepatitis C Blood testing is recommended for:  Everyone born from 1945 through 1965.  Anyone with known risk factors for hepatitis C.  Sexually Transmitted Diseases (STDs)  You should be screened each year for STDs including gonorrhea and chlamydia if: ? You are sexually active and are younger than 63 years of age. ? You are older than 63 years of age and your health care provider tells you that you are at risk for this type of infection. ? Your sexual activity has changed since you were last screened and you are at an increased risk for chlamydia or gonorrhea. Ask your health care provider if you are at risk.  Talk with your health care provider about whether you are at high risk of being infected with HIV.  Your health care provider may recommend a prescription medicine to help prevent HIV infection.  What else can I do?  Schedule regular health, dental, and eye exams.  Stay current with your vaccines (immunizations).  Do not use any tobacco products, such as cigarettes, chewing tobacco, and e-cigarettes. If you need help quitting, ask your health care provider.  Limit alcohol intake to no more than 2 drinks per day. One drink equals 12 ounces of beer, 5 ounces of wine, or 1 ounces of hard liquor.  Do not use street drugs.  Do not share needles.  Ask your health care provider for help if you need support or information about quitting drugs.  Tell your health care provider if you often feel depressed.  Tell your health care provider if you have ever been abused or do not feel safe at home. This information is not intended to replace advice given to you by your health care provider. Make sure you discuss any questions you have with your health care provider.   Document Released: 01/16/2008 Document Revised: 03/18/2016 Document Reviewed: 04/23/2015 Elsevier Interactive Patient Education  2018 [employer] Pneumococcal Conjugate Vaccine (PCV13) What You Need to Know 1. Why get vaccinated? Vaccination can protect both children and adults from pneumococcal disease. Pneumococcal disease is caused by bacteria that can spread from person to person through close contact. It can cause ear infections, and it can also lead to more serious infections of the:  Lungs (pneumonia),  Blood (bacteremia), and  Covering of the brain and spinal cord (meningitis).  Pneumococcal pneumonia is most common among adults. Pneumococcal meningitis can cause deafness and brain damage, and it kills about 1 child in 10 who get it. Anyone can get pneumococcal disease, but children under 2 years of age and adults 65 years and older, people with certain medical conditions, and cigarette smokers are at the highest  risk. Before there was a vaccine, the [place] saw:  more than 700 cases of meningitis,  about 13,000 blood infections,  about 5 million ear infections, and  about 200 deaths  in children under 5 each year from pneumococcal disease. Since vaccine became available, severe pneumococcal disease in these children has fallen by 88%. About 18,000 older adults die of pneumococcal disease each year in the [place]. Treatment of pneumococcal infections with penicillin and other drugs is not as effective as it used to be, because some strains of the disease have become resistant to these drugs. This makes prevention of the disease, through vaccination, even more important. 2. PCV13 vaccine Pneumococcal conjugate vaccine (called PCV13) protects against 13 types of pneumococcal bacteria. PCV13 is routinely given to children at 2, 4, 6, and 12-15 months of age. It is also recommended for children and adults 2 to 64 years of age with certain health conditions, and for all adults 65 years of age and older. Your doctor can give you details. 3. Some people should not get this vaccine Anyone who has ever had a life-threatening allergic reaction to a dose of this vaccine, to an earlier pneumococcal vaccine called PCV7, or to any vaccine containing diphtheria toxoid (for example, DTaP), should not get PCV13. Anyone with a severe allergy to any component of PCV13 should not get the vaccine. Tell your doctor if the person being vaccinated has any severe allergies. If the person scheduled for vaccination is not feeling well, your healthcare provider might decide to reschedule the shot on another day. 4. Risks of a vaccine reaction With any medicine, including vaccines, there is a chance of reactions. These are usually mild and go away on their own, but serious reactions are also possible. Problems reported following PCV13 varied by age and dose in the series. The most common problems reported among  children were:  About half became drowsy after the shot, had a temporary loss of appetite, or had redness or tenderness where the shot was given.  About 1 out of 3 had swelling where the shot was given.  About 1 out of 3 had a mild fever, and about 1 in 20 had a fever over 102.2F.  Up to about 8 out of 10 became fussy or irritable.  Adults have reported pain, redness, and swelling where the shot was given; also mild fever, fatigue, headache, chills, or muscle pain. Young children who get PCV13 along with inactivated flu vaccine at the same time may be at increased risk for seizures caused by fever. Ask your doctor for more information. Problems that could happen after any vaccine:  People sometimes   faint after a medical procedure, including vaccination. Sitting or lying down for about 15 minutes can help prevent fainting, and injuries caused by a fall. Tell your doctor if you feel dizzy, or have vision changes or ringing in the ears.  Some older children and adults get severe pain in the shoulder and have difficulty moving the arm where a shot was given. This happens very rarely.  Any medication can cause a severe allergic reaction. Such reactions from a vaccine are very rare, estimated at about 1 in a million doses, and would happen within a few minutes to a few hours after the vaccination. As with any medicine, there is a very small chance of a vaccine causing a serious injury or death. The safety of vaccines is always being monitored. For more information, visit: www.cdc.gov/vaccinesafety/ 5. What if there is a serious reaction? What should I look for? Look for anything that concerns you, such as signs of a severe allergic reaction, very high fever, or unusual behavior. Signs of a severe allergic reaction can include hives, swelling of the face and throat, difficulty breathing, a fast heartbeat, dizziness, and weakness-usually within a few minutes to a few hours after the  vaccination. What should I do?  If you think it is a severe allergic reaction or other emergency that can't wait, call 9-1-1 or get the person to the nearest hospital. Otherwise, call your doctor.  Reactions should be reported to the Vaccine Adverse Event Reporting System (VAERS). Your doctor should file this report, or you can do it yourself through the VAERS web site at www.vaers.hhs.gov, or by calling 1-800-822-7967. ? VAERS does not give medical advice. 6. The National Vaccine Injury Compensation Program The National Vaccine Injury Compensation Program (VICP) is a federal program that was created to compensate people who may have been injured by certain vaccines. Persons who believe they may have been injured by a vaccine can learn about the program and about filing a claim by calling 1-800-338-2382 or visiting the VICP website at www.hrsa.gov/vaccinecompensation. There is a time limit to file a claim for compensation. 7. How can I learn more?  Ask your healthcare provider. He or she can give you the vaccine package insert or suggest other sources of information.  Call your local or state health department.  Contact the Centers for Disease Control and Prevention (CDC): ? Call 1-800-232-4636 (1-800-CDC-INFO) or ? Visit CDC's website at www.cdc.gov/vaccines Vaccine Information Statement, PCV13 Vaccine (06/07/2014) This information is not intended to replace advice given to you by your health care provider. Make sure you discuss any questions you have with your health care provider. Document Released: 05/17/2006 Document Revised: 04/09/2016 Document Reviewed: 04/09/2016 Elsevier Interactive Patient Education  2017 Elsevier Inc.  Stool DNA Testing for Colon Cancer Why am I having this test? Colon cancer is the second-leading cause of cancer deaths in men and women. Testing for colon cancer before symptoms develop (screening) reduces your risk for this cancer. Stool DNA testing, also called  the FIT-DNA test, is one method of screening for colon cancer. Colon cancer grows slowly, so finding the cancer early means a better chance for effective treatment. Colon cancer screening is recommended for everyone who is 50-75 years old. Your health care provider may recommend having this test every 3 years if:  You are 50-75 years of age.  You have no symptoms of colon cancer. Symptoms include rectal bleeding, weight loss, and changes in bowel habits.  You have an average risk of colon cancer. Average   risk means: ? You do not have precancerous polyps. ? You do not have family or personal history of either colon cancer or a colon disease that increases your risk for colon cancer.  What is being tested? For the test, a sample of your stool (feces) is checked for blood and changes in DNA that could lead to cancer. Growths in your colon that are cancerous (malignant) or may become cancerous (precancerous polyps) bleed and shed cells. Blood and cells can be picked up by stool as it passes through your colon. This test checks for blood cells as well as nine types of DNA (biomarkers) in three genes that have been linked to colon cancer and precancerous polyps. What kind of sample is taken? This test uses a stool sample that is collected when you have a bowel movement. How do I collect samples at home? You will be sent a stool sample collection kit in the mail. It will include instructions and everything you need to get the sample. Instructions may include these steps:  Store the kit in a dry place at room temperature until you are ready to collect the sample. Keep the kit away from heat and sunlight.  To collect the sample, place the collection device over the toilet.  Collect the sample according to the instructions that came with your kit.  After collecting a stool sample, follow instructions carefully regarding mixing the stool with a preservative and sealing the sample.  Send the sample to  the lab in the postage-paid box that was included in the kit.  Your stool sample will be checked within 3 days. The results will be sent to your health care provider. How do I prepare for this test? There is no preparation required for this test. However, do not collect a stool sample if:  You have bleeding hemorrhoids.  You have any rectal bleeding.  You have a cut on your hand or finger.  You have your menstrual period.  You have diarrhea.  How are the results reported? Your test results will be reported as either positive or negative. It is up to you to get your test results. Ask your health care provider, or the department that is doing the test, when your results will be ready. What do the results mean?  A positive result means that the test found abnormal DNA, blood cells, or both. If you have a positive result, you will need to have a follow-up exam of your colon done with a scope (colonoscopy).  A negative result means that no blood or changes in DNA were found. This does not guarantee that you do not have colon cancer. Your health care provider may recommend that you have other screening tests. Talk with your health care provider to discuss your results, treatment options, and if necessary, the need for more tests. Talk with your health care provider if you have any questions about your results. This information is not intended to replace advice given to you by your health care provider. Make sure you discuss any questions you have with your health care provider. Document Released: 04/09/2016 Document Revised: 04/09/2016 Document Reviewed: 04/09/2016 Elsevier Interactive Patient Education  2018 Elsevier Inc.  

## 2017-08-20 NOTE — Assessment & Plan Note (Signed)
Stable. Continue current regimen. Continue to monitor.  

## 2017-08-20 NOTE — Assessment & Plan Note (Signed)
Continue to monitor. Call with any concerns. Stable.

## 2017-08-20 NOTE — Assessment & Plan Note (Signed)
Continue to work on cutting down. Call with any concerns.

## 2017-08-21 LAB — CBC WITH DIFFERENTIAL/PLATELET
BASOS: 1 %
Basophils Absolute: 0 10*3/uL (ref 0.0–0.2)
EOS (ABSOLUTE): 0.7 10*3/uL — ABNORMAL HIGH (ref 0.0–0.4)
Eos: 8 %
HEMOGLOBIN: 14 g/dL (ref 13.0–17.7)
Hematocrit: 42.9 % (ref 37.5–51.0)
IMMATURE GRANS (ABS): 0 10*3/uL (ref 0.0–0.1)
Immature Granulocytes: 0 %
LYMPHS ABS: 2.4 10*3/uL (ref 0.7–3.1)
LYMPHS: 27 %
MCH: 29.1 pg (ref 26.6–33.0)
MCHC: 32.6 g/dL (ref 31.5–35.7)
MCV: 89 fL (ref 79–97)
MONOCYTES: 7 %
Monocytes Absolute: 0.6 10*3/uL (ref 0.1–0.9)
NEUTROS ABS: 5.1 10*3/uL (ref 1.4–7.0)
Neutrophils: 57 %
Platelets: 337 10*3/uL (ref 150–379)
RBC: 4.81 x10E6/uL (ref 4.14–5.80)
RDW: 13.6 % (ref 12.3–15.4)
WBC: 8.7 10*3/uL (ref 3.4–10.8)

## 2017-08-21 LAB — COMPREHENSIVE METABOLIC PANEL
A/G RATIO: 1.6 (ref 1.2–2.2)
ALBUMIN: 4.6 g/dL (ref 3.6–4.8)
ALK PHOS: 90 IU/L (ref 39–117)
ALT: 20 IU/L (ref 0–44)
AST: 21 IU/L (ref 0–40)
BUN / CREAT RATIO: 11 (ref 10–24)
BUN: 8 mg/dL (ref 8–27)
Bilirubin Total: 0.3 mg/dL (ref 0.0–1.2)
CO2: 21 mmol/L (ref 20–29)
CREATININE: 0.75 mg/dL — AB (ref 0.76–1.27)
Calcium: 10.3 mg/dL — ABNORMAL HIGH (ref 8.6–10.2)
Chloride: 103 mmol/L (ref 96–106)
GFR calc Af Amer: 114 mL/min/{1.73_m2} (ref >59)
GFR, EST NON AFRICAN AMERICAN: 98 mL/min/{1.73_m2} (ref >59)
GLOBULIN, TOTAL: 2.9 g/dL (ref 1.5–4.5)
Glucose: 130 mg/dL — ABNORMAL HIGH (ref 65–99)
Potassium: 4.7 mmol/L (ref 3.5–5.2)
SODIUM: 145 mmol/L — AB (ref 134–144)
Total Protein: 7.5 g/dL (ref 6.0–8.5)

## 2017-08-21 LAB — LIPID PANEL W/O CHOL/HDL RATIO
CHOLESTEROL TOTAL: 151 mg/dL (ref 100–199)
HDL: 59 mg/dL (ref >39)
LDL CALC: 67 mg/dL (ref 0–99)
Triglycerides: 125 mg/dL (ref 0–149)
VLDL Cholesterol Cal: 25 mg/dL (ref 5–40)

## 2017-08-21 LAB — TSH: TSH: 2.37 u[IU]/mL (ref 0.450–4.500)

## 2017-08-21 LAB — PSA: PROSTATE SPECIFIC AG, SERUM: 1 ng/mL (ref 0.0–4.0)

## 2017-08-25 ENCOUNTER — Ambulatory Visit: Payer: Managed Care, Other (non HMO)

## 2017-08-27 ENCOUNTER — Ambulatory Visit: Payer: Managed Care, Other (non HMO)

## 2017-09-01 ENCOUNTER — Ambulatory Visit: Payer: Managed Care, Other (non HMO)

## 2017-09-03 ENCOUNTER — Ambulatory Visit: Payer: Managed Care, Other (non HMO)

## 2017-09-13 NOTE — Progress Notes (Deleted)
Cardiology Office Note  Date:  09/13/2017   ID:  [name]., DOB 1955-08-12, MRN [id number]  PCP:  [name], DO   No chief complaint on file.   HPI:    PMH:   has a past medical history of Diabetes ([place]), History of hernia repair, Hypertension, and Stroke ([place]).  PSH:    Past Surgical History:  Procedure Laterality Date  . APPENDECTOMY    . HERNIA REPAIR      Current Outpatient Medications  Medication Sig Dispense Refill  . apixaban (ELIQUIS) 5 MG TABS tablet Take 1 tablet (5 mg total) by mouth 2 (two) times daily. 180 tablet 1  . Ascorbic Acid (VITAMIN C) 100 MG tablet Take 100 mg by mouth daily.    Marland Kitchen aspirin 81 MG EC tablet Take 1 tablet (81 mg total) by mouth daily. 90 tablet 4  . B Complex Vitamins (VITAMIN B COMPLEX PO) Take by mouth.    . ciclopirox (PENLAC) 8 % solution Apply over nail at Bedtime and surrounding skin. Apply daily over previous coat. After seven (7) days, may remove with alcohol and continue (Patient not taking: Reported on 06/21/2017) 6.6 mL 0  . diltiazem (CARDIZEM CD) 240 MG 24 hr capsule Take 1 capsule (240 mg total) by mouth daily. 90 capsule 1  . empagliflozin (JARDIANCE) 10 MG TABS tablet Take 10 mg by mouth daily. (Patient taking differently: Take 5 mg daily by mouth. ) 90 tablet 3  . metFORMIN (GLUCOPHAGE) 1000 MG tablet Take 1 tablet (1,000 mg total) by mouth 2 (two) times daily with a meal. 180 tablet 3  . Multiple Vitamin (MULTIVITAMIN WITH MINERALS) TABS tablet Take 1 tablet by mouth daily. 30 tablet 0  . oxyCODONE (OXYCONTIN) 10 mg 12 hr tablet Take 1 tablet (10 mg total) by mouth every 12 (twelve) hours. (Patient taking differently: Take 10 mg by mouth every 12 (twelve) hours. ) 20 tablet 0  . simvastatin (ZOCOR) 40 MG tablet TAKE 1 TABLET (40 MG TOTAL) BY MOUTH AT BEDTIME. 90 tablet 3   No current facility-administered medications for this visit.      Allergies:   Lipitor [atorvastatin] and Gabapentin   Social History:   The patient  reports that he has been smoking cigarettes.  He has a 15.00 pack-year smoking history. he has never used smokeless tobacco. He reports that he drinks about 4.2 oz of alcohol per week. He reports that he does not use drugs.   Family History:   family history includes Diabetes in his father; Hyperlipidemia in his father; Hypertension in his father.    Review of Systems: ROS   PHYSICAL EXAM: VS:  There were no vitals taken for this visit. , BMI There is no height or weight on file to calculate BMI. GEN: Well nourished, well developed, in no acute distress HEENT: normal Neck: no JVD, carotid bruits, or masses Cardiac: RRR; no murmurs, rubs, or gallops,no edema  Respiratory:  clear to auscultation bilaterally, normal work of breathing GI: soft, nontender, nondistended, + BS MS: no deformity or atrophy Skin: warm and dry, no rash Neuro:  Strength and sensation are intact Psych: euthymic mood, full affect    Recent Labs: 08/20/2017: ALT 20; BUN 8; Creatinine, Ser 0.75; Hemoglobin 14.0; Platelets 337; Potassium 4.7; Sodium 145; TSH 2.370    Lipid Panel Lab Results  Component Value Date   CHOL 151 08/20/2017   HDL 59 08/20/2017   LDLCALC 67 08/20/2017   TRIG 125 08/20/2017  Wt Readings from Last 3 Encounters:  08/20/17 187 lb (84.8 kg)  07/20/17 190 lb (86.2 kg)  07/15/17 187 lb 8 oz (85 kg)       ASSESSMENT AND PLAN:  No diagnosis found.   Disposition:   F/U  6 months  No orders of the defined types were placed in this encounter.    Signed, Esmond Plants, M.D., Ph.D. 09/13/2017  [place], [place]

## 2017-09-14 ENCOUNTER — Telehealth: Payer: Self-pay | Admitting: Cardiovascular Disease

## 2017-09-14 ENCOUNTER — Encounter: Payer: Self-pay | Admitting: Family Medicine

## 2017-09-14 ENCOUNTER — Ambulatory Visit: Payer: Medicare HMO | Admitting: Cardiovascular Disease

## 2017-09-14 NOTE — Telephone Encounter (Signed)
Lmov for patient to call back to reschedule appointment   Will await to reschedule appointment

## 2017-10-05 ENCOUNTER — Encounter: Payer: Self-pay | Admitting: Family Medicine

## 2017-10-05 ENCOUNTER — Ambulatory Visit (INDEPENDENT_AMBULATORY_CARE_PROVIDER_SITE_OTHER): Payer: Medicare HMO | Admitting: Family Medicine

## 2017-10-05 VITALS — BP 126/68 | HR 91 | Temp 98.3°F | Ht 69.6 in | Wt 186.0 lb

## 2017-10-05 DIAGNOSIS — I739 Peripheral vascular disease, unspecified: Secondary | ICD-10-CM

## 2017-10-05 NOTE — Progress Notes (Signed)
BP 126/68   Pulse 91   Temp 98.3 F (36.8 C) (Oral)   Ht 5' 9.6" (1.768 m)   Wt 186 lb (84.4 kg)   SpO2 98%   BMI 27.00 kg/m    Subjective:    Patient ID: Glenn Gerold., male    DOB: [birth year], 61 y.o.   MRN: [id number]  HPI: Glenn Armor. is a 62 y.o. male  Chief Complaint  Patient presents with  . Sore    pt states he has a discolored/ dark sore on his right foot that he noticied about a week ago   SKIN LESION Duration: 1 week Location: R foot Painful: yes Itching: no Onset: sudden Context: bigger Associated signs and symptoms: numbness History of skin cancer: no History of precancerous skin lesions: no Family history of skin cancer: no  Relevant past medical, surgical, family and social history reviewed and updated as indicated. Interim medical history since our last visit reviewed. Allergies and medications reviewed and updated.  Review of Systems  Constitutional: Negative.   Respiratory: Negative.   Cardiovascular: Negative.   Musculoskeletal: Negative.   Skin: Positive for color change. Negative for pallor, rash and wound.  Psychiatric/Behavioral: Negative.     Per HPI unless specifically indicated above     Objective:    BP 126/68   Pulse 91   Temp 98.3 F (36.8 C) (Oral)   Ht 5' 9.6" (1.768 m)   Wt 186 lb (84.4 kg)   SpO2 98%   BMI 27.00 kg/m   Wt Readings from Last 3 Encounters:  10/05/17 186 lb (84.4 kg)  08/20/17 187 lb (84.8 kg)  07/20/17 190 lb (86.2 kg)    Physical Exam  Constitutional: He is oriented to person, place, and time. He appears well-developed and well-nourished. No distress.  HENT:  Head: Normocephalic and atraumatic.  Right Ear: Hearing normal.  Left Ear: Hearing normal.  Nose: Nose normal.  Eyes: Conjunctivae and lids are normal. Right eye exhibits no discharge. Left eye exhibits no discharge. No scleral icterus.  Cardiovascular: Normal rate, regular rhythm, normal heart sounds and intact distal  pulses. Exam reveals no gallop and no friction rub.  No murmur heard. Pulmonary/Chest: Effort normal and breath sounds normal. No respiratory distress. He has no wheezes. He has no rales. He exhibits no tenderness.  Musculoskeletal: Normal range of motion.  Neurological: He is alert and oriented to person, place, and time.  Skin: Skin is warm, dry and intact. No rash noted. He is not diaphoretic. No erythema. No pallor.  Colder toes on the R, good pulse, erythematous great and pinky toes  Psychiatric: He has a normal mood and affect. His speech is normal and behavior is normal. Judgment and thought content normal. Cognition and memory are normal.  Nursing note and vitals reviewed.   Results for orders placed or performed in visit on 08/20/17  CBC with Differential/Platelet  Result Value Ref Range   WBC 8.7 3.4 - 10.8 x10E3/uL   RBC 4.81 4.14 - 5.80 x10E6/uL   Hemoglobin 14.0 13.0 - 17.7 g/dL   Hematocrit 42.9 37.5 - 51.0 %   MCV 89 79 - 97 fL   MCH 29.1 26.6 - 33.0 pg   MCHC 32.6 31.5 - 35.7 g/dL   RDW 13.6 12.3 - 15.4 %   Platelets 337 150 - 379 x10E3/uL   Neutrophils 57 Not Estab. %   Lymphs 27 Not Estab. %   Monocytes 7 Not Estab. %  Eos 8 Not Estab. %   Basos 1 Not Estab. %   Neutrophils Absolute 5.1 1.4 - 7.0 x10E3/uL   Lymphocytes Absolute 2.4 0.7 - 3.1 x10E3/uL   Monocytes Absolute 0.6 0.1 - 0.9 x10E3/uL   EOS (ABSOLUTE) 0.7 (H) 0.0 - 0.4 x10E3/uL   Basophils Absolute 0.0 0.0 - 0.2 x10E3/uL   Immature Granulocytes 0 Not Estab. %   Immature Grans (Abs) 0.0 0.0 - 0.1 x10E3/uL  Bayer DCA Hb A1c Waived  Result Value Ref Range   Bayer DCA Hb A1c Waived 6.6 <7.0 %  Comprehensive metabolic panel  Result Value Ref Range   Glucose 130 (H) 65 - 99 mg/dL   BUN 8 8 - 27 mg/dL   Creatinine, Ser 0.75 (L) 0.76 - 1.27 mg/dL   GFR calc non Af Amer 98 >59 mL/min/1.73   GFR calc Af Amer 114 >59 mL/min/1.73   BUN/Creatinine Ratio 11 10 - 24   Sodium 145 (H) 134 - 144 mmol/L    Potassium 4.7 3.5 - 5.2 mmol/L   Chloride 103 96 - 106 mmol/L   CO2 21 20 - 29 mmol/L   Calcium 10.3 (H) 8.6 - 10.2 mg/dL   Total Protein 7.5 6.0 - 8.5 g/dL   Albumin 4.6 3.6 - 4.8 g/dL   Globulin, Total 2.9 1.5 - 4.5 g/dL   Albumin/Globulin Ratio 1.6 1.2 - 2.2   Bilirubin Total 0.3 0.0 - 1.2 mg/dL   Alkaline Phosphatase 90 39 - 117 IU/L   AST 21 0 - 40 IU/L   ALT 20 0 - 44 IU/L  Lipid Panel w/o Chol/HDL Ratio  Result Value Ref Range   Cholesterol, Total 151 100 - 199 mg/dL   Triglycerides 125 0 - 149 mg/dL   HDL 59 >39 mg/dL   VLDL Cholesterol Cal 25 5 - 40 mg/dL   LDL Calculated 67 0 - 99 mg/dL  Microalbumin, Urine Waived  Result Value Ref Range   Microalb, Ur Waived 10 0 - 19 mg/L   Creatinine, Urine Waived 10 10 - 300 mg/dL   Microalb/Creat Ratio 30-300 (H) <30 mg/g  PSA  Result Value Ref Range   Prostate Specific Ag, Serum 1.0 0.0 - 4.0 ng/mL  TSH  Result Value Ref Range   TSH 2.370 0.450 - 4.500 uIU/mL  UA/M w/rflx Culture, Routine  Result Value Ref Range   Specific Gravity, UA 1.010 1.005 - 1.030   pH, UA 7.0 5.0 - 7.5   Color, UA Yellow Yellow   Appearance Ur Clear Clear   Leukocytes, UA Negative Negative   Protein, UA Negative Negative/Trace   Glucose, UA 3+ (A) Negative   Ketones, UA Negative Negative   RBC, UA Negative Negative   Bilirubin, UA Negative Negative   Urobilinogen, Ur 0.2 0.2 - 1.0 mg/dL   Nitrite, UA Negative Negative      Assessment & Plan:   Problem List Items Addressed This Visit      Cardiovascular and Mediastinum   PVD (peripheral vascular disease) ([place]) - Primary    To see Dr. Lucky Cowboy on 3/19. Continue to follow with him. No signs of ulcers today. Call with any concerns.           Follow up plan: Return As scheduled.

## 2017-10-05 NOTE — Assessment & Plan Note (Signed)
To see Dr. Lucky Cowboy on 3/19. Continue to follow with him. No signs of ulcers today. Call with any concerns.

## 2017-10-05 NOTE — Patient Instructions (Addendum)
Peripheral Vascular Disease Peripheral vascular disease (PVD) is a disease of the blood vessels that are not part of your heart and brain. A simple term for PVD is poor circulation. In most cases, PVD narrows the blood vessels that carry blood from your heart to the rest of your body. This can result in a decreased supply of blood to your arms, legs, and internal organs, like your stomach or kidneys. However, it most often affects a person's lower legs and feet. There are two types of PVD.  Organic PVD. This is the more common type. It is caused by damage to the structure of blood vessels.  Functional PVD. This is caused by conditions that make blood vessels contract and tighten (spasm).  Without treatment, PVD tends to get worse over time. PVD can also lead to acute ischemic limb. This is when an arm or limb suddenly has trouble getting enough blood. This is a medical emergency. Follow these instructions at home:  Take medicines only as told by your doctor.  Do not use any tobacco products, including cigarettes, chewing tobacco, or electronic cigarettes. If you need help quitting, ask your doctor.  Lose weight if you are overweight, and maintain a healthy weight as told by your doctor.  Eat a diet that is low in fat and cholesterol. If you need help, ask your doctor.  Exercise regularly. Ask your doctor for some good activities for you.  Take good care of your feet. ? Wear comfortable shoes that fit well. ? Check your feet often for any cuts or sores. Contact a doctor if:  You have cramps in your legs while walking.  You have leg pain when you are at rest.  You have coldness in a leg or foot.  Your skin changes.  You are unable to get or have an erection (erectile dysfunction).  You have cuts or sores on your feet that are not healing. Get help right away if:  Your arm or leg turns cold and blue.  Your arms or legs become red, warm, swollen, painful, or numb.  You have  chest pain or trouble breathing.  You suddenly have weakness in your face, arm, or leg.  You become very confused or you cannot speak.  You suddenly have a very bad headache.  You suddenly cannot see. This information is not intended to replace advice given to you by your health care provider. Make sure you discuss any questions you have with your health care provider. Document Released: 10/14/2009 Document Revised: 12/26/2015 Document Reviewed: 12/28/2013 Elsevier Interactive Patient Education  2017 Else[employer] Ankle-Brachial Index Test The ankle-brachial index (ABI) test is used to find peripheral vascular disease (PVD). PVD is also known as peripheral arterial disease (PAD). PVD is the blocking or hardening of the arteries anywhere within the circulatory system beyond the heart. PVD is caused by cholesterol deposits in your blood vessels (atherosclerosis). These deposits cause arteries to narrow. The delivery of oxygen to your tissues is impaired as a result. This can cause muscle pain and fatigue. This is called claudication. PVD means there may also be buildup of cholesterol in your:  Heart. This increases the risk of heart attacks.  Brain. This increases the risk of strokes.  The ankle-brachial index test measures the blood flow in your arms and legs. This test also determines if blood vessels in your leg are narrowed by cholesterol deposits. There are additional causes of a reduced ankle-brachial index, such as inflammation of vessels or a clot in  the vessels. However, these are much less common than narrowing due to cholesterol deposits. What is being tested? The test is done while you are lying down and resting. Measurements are taken of the systolic pressure:  In your arm (brachial).  In your ankle at several points along your leg.  Systolic pressure is the pressure inside your arteries when your heart pumps. The measurements are taken several times on both sides. Then, the  highest systolic pressure of the ankle is divided by the highest brachial systolic pressure. The result is the ankle-brachial pressure ratio, or ABI. Sometimes this test is repeated after you have exercised on a treadmill for five minutes. You may have leg pain during the exercise portion of the test if you suffer from PAD. If the index number drops after exercise, this may show that PAD is present. A normal ABI ratio is between 0.9 and 1.4. A value below 0.9 is considered abnormal. This information is not intended to replace advice given to you by your health care provider. Make sure you discuss any questions you have with your health care provider. Document Released: 07/24/2004 Document Revised: 12/26/2015 Document Reviewed: 02/23/2014 Elsevier Interactive Patient Education  [employer].

## 2017-10-14 NOTE — Telephone Encounter (Signed)
Noted  

## 2017-10-14 NOTE — Telephone Encounter (Signed)
[name] from [facility] calling in regards to request for records Patient is not coming up in their system at all They will check storage to see if perhaps patient saw Dr Clayborn Bigness before he came to [facility]

## 2017-10-14 NOTE — Telephone Encounter (Signed)
To CMA's as an FYI.

## 2017-10-18 NOTE — Progress Notes (Signed)
Cardiology Office Note  Date:  10/20/2017   ID:  [name]., DOB [birth year], MRN [id number]  PCP:  Glenn Roys, DO   Chief Complaint  [name] presents with  . other    Ref by Dr. Wynetta [name] for A-fib. Meds reviewed by Glenn pt.'s wife. Pt. will need an angiogram for PVD; not scheduled yet. Denies chest pain or shortness of breath.     HPI:  [name] is a 62 y.o.male [name] who has a history of  Stroke in 2016, with right homonymous hemianopsia  cortical infarct in left precentral gyrus and left medial parietal lobe Recurrent stroke November 2018 Paroxysmal atrial fibrillation PAD, Followed by Dr. Lucky [name]  short segment right iliac occlusion, as well as arterial runoff disease below Glenn right knee diabetes  hyperlipidemia  hypertension  Smoker, 9 cigs a day previous alcohol abuse /alcoholism Who presents by referral from Dr. Wynetta [name] for his atrial fibrillation  Reports that [name] was recently seen by Dr. Lucky [name], There had been some discussion to do angiogram  Asymptomatic atrial fibrillation Does not realize when [name] is having arrhythmia Discussed stroke in 2016, 2018 Has been maintained on Eliquis since 2018 Denies any further TIA or stroke symptoms    reduced vision because of Glenn stroke , on disability Reports having headaches in Glenn past  Previous workup in 2016 for stroke was requested  Stress test performed showing ejection fraction 50% no ischemia Echocardiogram August 2016 essentially normal with mild MR ejection fraction 55-60% Holter but results are unavailable Carotid ultrasound 2016 with mild plaque  Repeat testing November 2018 Echocardiogram with normal ejection fraction 55-65% Carotid ultrasound less than 50% bilaterally  CT scan runoff/aorta November 2018 1. Occlusion of Glenn right posterior tibial artery approximately 3 cm above Glenn ankle mortise. 2. Tapered appearance of Glenn right peroneal artery as it approaches Glenn ankle without abrupt  occlusion. Patent right dorsalis pedis. 3. Normal left three-vessel runoff to Glenn level of Glenn ankle. 4. 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 5.  Aortic Atherosclerosis   Lab work reviewed [name] HBA1C 6.6  Total chol 151 LDL 67  EKG Jun 21 2017: atrial fib  EKG personally reviewed by myself on todays visit Shows normal sinus rhythm rate 94 bpm no significant ST or T wave changes   PMH:   has a past medical history of A-fib ([name]), Diabetes ([name]), History of hernia repair, Hyperlipidemia, Hypertension, and Stroke ([name]).  PSH:    Past Surgical History:  Procedure Laterality Date  . APPENDECTOMY    . HERNIA REPAIR      Current Outpatient Medications  Medication Sig Dispense Refill  . apixaban (ELIQUIS) 5 MG TABS tablet Take 1 tablet (5 mg total) by mouth 2 (two) times daily. 180 tablet 1  . aspirin 81 MG EC tablet Take 1 tablet (81 mg total) by mouth daily. 90 tablet 4  . B Complex Vitamins (VITAMIN B COMPLEX PO) Take by mouth.    . cholecalciferol (VITAMIN D) 1000 units tablet Take 1,000 Units by mouth daily.    . ciclopirox (PENLAC) 8 % solution Apply over nail at Bedtime and surrounding skin. Apply daily over previous coat. After seven (7) days, may remove with alcohol and continue 6.6 mL 0  . diltiazem (CARDIZEM CD) 240 MG 24 hr capsule Take 1 capsule (240 mg total) by mouth daily. 90 capsule 1  .  empagliflozin (JARDIANCE) 10 MG TABS tablet Take 10 mg by mouth daily. ([name] taking differently: Take 5 mg daily by mouth. ) 90 tablet 3  . metFORMIN (GLUCOPHAGE) 1000 MG tablet Take 1 tablet (1,000 mg total) by mouth 2 (two) times daily with a meal. 180 tablet 3  . Multiple Vitamin (MULTIVITAMIN WITH MINERALS) TABS tablet Take 1 tablet by mouth daily. 30 tablet 0  . oxyCODONE (OXYCONTIN) 10 mg 12 hr tablet Take 1 tablet (10  mg total) by mouth every 12 (twelve) hours. ([name] taking differently: Take 10 mg by mouth every 12 (twelve) hours. ) 20 tablet 0  . simvastatin (ZOCOR) 40 MG tablet TAKE 1 TABLET (40 MG TOTAL) BY MOUTH AT BEDTIME. 90 tablet 3  . vitamin C (ASCORBIC ACID) 500 MG tablet Take 500 mg by mouth daily.     No current facility-administered medications for this visit.      Allergies:   Lipitor [atorvastatin] and Gabapentin   Social History:  [name]  reports that [name] has been smoking cigarettes.  [name] has a 7.50 pack-year smoking history. [name] has never used smokeless tobacco. [name] reports that [name] drinks about 4.2 oz of alcohol per week. [name] reports that [name] does not use drugs.   Family History:   family history includes Diabetes in his father; Hyperlipidemia in his father; Hypertension in his father.    Review of Systems: Review of Systems  Constitutional: Negative.   Respiratory: Negative.   Cardiovascular: Negative.   Gastrointestinal: Negative.   Musculoskeletal: Negative.   Neurological: Negative.   Psychiatric/Behavioral: Negative.   All other systems reviewed and are negative.    PHYSICAL EXAM: VS:  BP (!) 142/80 (BP Location: Right Arm, [name] Position: Sitting, Cuff Size: Normal)   Pulse 94   Ht 5' 9.5" (1.765 m)   Wt 185 lb 8 oz (84.1 kg)   BMI 27.00 kg/m  , BMI Body mass index is 27 kg/m. GEN: Well nourished, well developed, in no acute distress  HEENT: normal  Neck: no JVD, carotid bruits, or masses Cardiac: RRR; no murmurs, rubs, or gallops,no edema  Respiratory:  clear to auscultation bilaterally, normal work of breathing GI: soft, nontender, nondistended, + BS MS: no deformity or atrophy  Skin: warm and dry, no rash Feet are cold worse on Glenn right than Glenn left, mottled appearing toes on Glenn right foot large toe, second toe, fourth and fifth toes.  No tenderness Also nonhealing small ulcer right side of foot Neuro:  Strength and sensation are intact Psych:  euthymic mood, full affect    Recent Labs: 08/20/2017: ALT 20; BUN 8; Creatinine, Ser 0.75; Hemoglobin 14.0; Platelets 337; Potassium 4.7; Sodium 145; TSH 2.370    Lipid Panel Lab Results  Component Value Date   CHOL 151 08/20/2017   HDL 59 08/20/2017   LDLCALC 67 08/20/2017   TRIG 125 08/20/2017      Wt Readings from Last 3 Encounters:  10/20/17 185 lb 8 oz (84.1 kg)  10/05/17 186 lb (84.4 kg)  08/20/17 187 lb (84.8 kg)       ASSESSMENT AND PLAN:  Paroxysmal atrial fibrillation ([place]) - Plan: EKG 12-Lead On Eliquis 5 twice daily Documentation of paroxysmal atrial fibrillation, EKG November 2018 May have contributed to stroke in 2016 Echo normal.  No further workup at this time  Recommend [name] continue diltiazem at current dose Reports [name] is asymptomatic  PVD (peripheral vascular disease) ([place]) Most of his visit today was spent discussing his lower extremity  arterial disease Right foot with multiple toes, appearing cyanotic Wife reports this is Glenn worst it has ever looked CT scan several months ago documenting multiple regions of arterial obstruction/blockage We paged Dr. Lucky [name] [name] reports [name] has been trying to do angiogram for several months, previously [name] declined,  His office has been in touch with [name], they will again try to schedule lower extremity angiogram I stressed him Glenn importance of this angiogram and need to quit smoking Discussed with [name]'s wife, if [name] develops foot pain or if this appears worse in appearance over Glenn next 48 hours, would suggest [name] go to Glenn emergency room  Diabetic peripheral neuropathy ([place]) Long history of diabetes, smoking and alcohol leading to neuropathy Also with PAD  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 No changes to Glenn  medications were made.  Disposition:   F/U 12 mon  Case discussed with Dr. Lucky [name].  Long discussion with [name] and [name]'s wife  Total encounter time more than 60 minutes  Greater than 50% was spent in counseling and coordination of care with [name]    Orders Placed This Encounter  Procedures  . EKG 12-Lead     Signed, Esmond Plants, M.D., Ph.D. 10/20/2017  [place], [place]

## 2017-10-19 ENCOUNTER — Ambulatory Visit (INDEPENDENT_AMBULATORY_CARE_PROVIDER_SITE_OTHER): Payer: Medicare HMO

## 2017-10-19 ENCOUNTER — Ambulatory Visit (INDEPENDENT_AMBULATORY_CARE_PROVIDER_SITE_OTHER): Payer: Self-pay | Admitting: Vascular Surgery

## 2017-10-19 DIAGNOSIS — I739 Peripheral vascular disease, unspecified: Secondary | ICD-10-CM

## 2017-10-20 ENCOUNTER — Encounter: Payer: Self-pay | Admitting: Cardiovascular Disease

## 2017-10-20 ENCOUNTER — Ambulatory Visit: Payer: Medicare HMO | Admitting: Cardiovascular Disease

## 2017-10-20 VITALS — BP 142/80 | HR 94 | Ht 69.5 in | Wt 185.5 lb

## 2017-10-20 DIAGNOSIS — E1142 Type 2 diabetes mellitus with diabetic polyneuropathy: Secondary | ICD-10-CM

## 2017-10-20 DIAGNOSIS — G621 Alcoholic polyneuropathy: Secondary | ICD-10-CM

## 2017-10-20 DIAGNOSIS — I480 Paroxysmal atrial fibrillation: Secondary | ICD-10-CM

## 2017-10-20 DIAGNOSIS — Z720 Tobacco use: Secondary | ICD-10-CM

## 2017-10-20 DIAGNOSIS — R69 Illness, unspecified: Secondary | ICD-10-CM | POA: Diagnosis not present

## 2017-10-20 DIAGNOSIS — I739 Peripheral vascular disease, unspecified: Secondary | ICD-10-CM | POA: Diagnosis not present

## 2017-10-20 DIAGNOSIS — E782 Mixed hyperlipidemia: Secondary | ICD-10-CM

## 2017-10-20 NOTE — Patient Instructions (Signed)
Medication Instructions:   No medication changes made  Labwork:  No new labs needed  Testing/Procedures:  No further testing at this time   Follow-Up: It was a pleasure seeing you in the office today. Please call us if you have new issues that need to be addressed before your next appt.  [phone]  Your physician wants you to follow-up in:  As needed  If you need a refill on your cardiac medications before your next appointment, please call your pharmacy.  For educational health videos Log in to : [email or web address] Or : [email or web address], password : [email or web address]  

## 2017-10-21 ENCOUNTER — Encounter (INDEPENDENT_AMBULATORY_CARE_PROVIDER_SITE_OTHER): Payer: Self-pay

## 2017-10-22 ENCOUNTER — Other Ambulatory Visit (INDEPENDENT_AMBULATORY_CARE_PROVIDER_SITE_OTHER): Payer: Self-pay | Admitting: Vascular Surgery

## 2017-10-24 MED ORDER — CEFAZOLIN SODIUM-DEXTROSE 2-4 GM/100ML-% IV SOLN: 2.0000 g | Freq: Once | INTRAVENOUS | Status: DC

## 2017-10-25 ENCOUNTER — Other Ambulatory Visit
Admission: RE | Admit: 2017-10-25 | Discharge: 2017-10-25 | Disposition: A | Discharge status: Home / Self Care | Payer: Medicare HMO | Source: Ambulatory Visit | Attending: Vascular Surgery | Admitting: Vascular Surgery

## 2017-10-25 ENCOUNTER — Ambulatory Visit
Admission: RE | Admit: 2017-10-25 | Discharge: 2017-10-25 | Disposition: A | Discharge status: Home / Self Care | Payer: Medicare HMO | Source: Ambulatory Visit | Attending: Vascular Surgery | Admitting: Vascular Surgery

## 2017-10-25 ENCOUNTER — Encounter: Payer: Self-pay | Admitting: *Deleted

## 2017-10-25 ENCOUNTER — Encounter
Admission: RE | Disposition: A | Discharge status: Home / Self Care | Payer: Self-pay | Source: Ambulatory Visit | Attending: Vascular Surgery

## 2017-10-25 DIAGNOSIS — F17210 Nicotine dependence, cigarettes, uncomplicated: Secondary | ICD-10-CM | POA: Insufficient documentation

## 2017-10-25 DIAGNOSIS — Z7902 Long term (current) use of antithrombotics/antiplatelets: Secondary | ICD-10-CM | POA: Insufficient documentation

## 2017-10-25 DIAGNOSIS — Z833 Family history of diabetes mellitus: Secondary | ICD-10-CM | POA: Diagnosis not present

## 2017-10-25 DIAGNOSIS — E1142 Type 2 diabetes mellitus with diabetic polyneuropathy: Secondary | ICD-10-CM | POA: Insufficient documentation

## 2017-10-25 DIAGNOSIS — Z7982 Long term (current) use of aspirin: Secondary | ICD-10-CM | POA: Diagnosis not present

## 2017-10-25 DIAGNOSIS — Z888 Allergy status to other drugs, medicaments and biological substances status: Secondary | ICD-10-CM | POA: Diagnosis not present

## 2017-10-25 DIAGNOSIS — Z79899 Other long term (current) drug therapy: Secondary | ICD-10-CM | POA: Insufficient documentation

## 2017-10-25 DIAGNOSIS — E782 Mixed hyperlipidemia: Secondary | ICD-10-CM | POA: Diagnosis not present

## 2017-10-25 DIAGNOSIS — G621 Alcoholic polyneuropathy: Secondary | ICD-10-CM | POA: Diagnosis not present

## 2017-10-25 DIAGNOSIS — Z8249 Family history of ischemic heart disease and other diseases of the circulatory system: Secondary | ICD-10-CM | POA: Insufficient documentation

## 2017-10-25 DIAGNOSIS — I10 Essential (primary) hypertension: Secondary | ICD-10-CM | POA: Insufficient documentation

## 2017-10-25 DIAGNOSIS — Z7984 Long term (current) use of oral hypoglycemic drugs: Secondary | ICD-10-CM | POA: Insufficient documentation

## 2017-10-25 DIAGNOSIS — I480 Paroxysmal atrial fibrillation: Secondary | ICD-10-CM | POA: Diagnosis not present

## 2017-10-25 DIAGNOSIS — Z8673 Personal history of transient ischemic attack (TIA), and cerebral infarction without residual deficits: Secondary | ICD-10-CM | POA: Insufficient documentation

## 2017-10-25 DIAGNOSIS — I739 Peripheral vascular disease, unspecified: Secondary | ICD-10-CM

## 2017-10-25 DIAGNOSIS — Z9889 Other specified postprocedural states: Secondary | ICD-10-CM | POA: Insufficient documentation

## 2017-10-25 DIAGNOSIS — I70221 Atherosclerosis of native arteries of extremities with rest pain, right leg: Secondary | ICD-10-CM | POA: Diagnosis present

## 2017-10-25 DIAGNOSIS — I70223 Atherosclerosis of native arteries of extremities with rest pain, bilateral legs: Secondary | ICD-10-CM | POA: Diagnosis not present

## 2017-10-25 DIAGNOSIS — R69 Illness, unspecified: Secondary | ICD-10-CM | POA: Diagnosis not present

## 2017-10-25 HISTORY — DX: Peripheral vascular disease, unspecified: I73.9

## 2017-10-25 HISTORY — PX: LOWER EXTREMITY ANGIOGRAPHY: CATH118251

## 2017-10-25 HISTORY — PX: LOWER EXTREMITY INTERVENTION: CATH118252

## 2017-10-25 HISTORY — DX: Legal blindness, as defined in USA: H54.8

## 2017-10-25 LAB — CREATININE, SERUM
CREATININE: 0.76 mg/dL (ref 0.61–1.24)
GFR calc non Af Amer: >60 mL/min (ref >60)

## 2017-10-25 LAB — BUN: BUN: 13 mg/dL (ref 6–20)

## 2017-10-25 SURGERY — LOWER EXTREMITY ANGIOGRAPHY
Anesthesia: Moderate Sedation | Laterality: Right

## 2017-10-25 MED ORDER — FENTANYL CITRATE (PF) 100 MCG/2ML IJ SOLN
INTRAMUSCULAR | Status: AC
  Filled 2017-10-25: qty 2

## 2017-10-25 MED ORDER — MIDAZOLAM HCL 2 MG/2ML IJ SOLN
INTRAMUSCULAR | Status: DC | PRN
  Administered 2017-10-25 (×2): 1 mg via INTRAVENOUS
  Administered 2017-10-25: 2 mg via INTRAVENOUS
  Administered 2017-10-25 (×2): 1 mg via INTRAVENOUS

## 2017-10-25 MED ORDER — HYDROMORPHONE HCL 1 MG/ML IJ SOLN: 1.0000 mg | Freq: Once | INTRAMUSCULAR | Status: DC | PRN

## 2017-10-25 MED ORDER — HEPARIN (PORCINE) IN NACL 2-0.9 UNIT/ML-% IJ SOLN
INTRAMUSCULAR | Status: AC
  Filled 2017-10-25: qty 1000

## 2017-10-25 MED ORDER — IOPAMIDOL (ISOVUE-300) INJECTION 61%
INTRAVENOUS | Status: DC | PRN
  Administered 2017-10-25: 70 mL via INTRAVENOUS

## 2017-10-25 MED ORDER — METHYLPREDNISOLONE SODIUM SUCC 125 MG IJ SOLR: 125.0000 mg | INTRAMUSCULAR | Status: DC | PRN

## 2017-10-25 MED ORDER — FAMOTIDINE 20 MG PO TABS: 40.0000 mg | ORAL_TABLET | ORAL | Status: DC | PRN

## 2017-10-25 MED ORDER — MIDAZOLAM HCL 5 MG/5ML IJ SOLN
INTRAMUSCULAR | Status: AC
  Filled 2017-10-25: qty 5

## 2017-10-25 MED ORDER — HEPARIN SODIUM (PORCINE) 1000 UNIT/ML IJ SOLN
INTRAMUSCULAR | Status: AC
  Filled 2017-10-25: qty 1

## 2017-10-25 MED ORDER — ONDANSETRON HCL 4 MG/2ML IJ SOLN: 4.0000 mg | Freq: Four times a day (QID) | INTRAMUSCULAR | Status: DC | PRN

## 2017-10-25 MED ORDER — LIDOCAINE-EPINEPHRINE (PF) 1 %-1:200000 IJ SOLN
INTRAMUSCULAR | Status: AC
  Filled 2017-10-25: qty 30

## 2017-10-25 MED ORDER — HEPARIN SODIUM (PORCINE) 1000 UNIT/ML IJ SOLN
INTRAMUSCULAR | Status: DC | PRN
  Administered 2017-10-25: 5000 [IU] via INTRAVENOUS

## 2017-10-25 MED ORDER — FENTANYL CITRATE (PF) 100 MCG/2ML IJ SOLN
INTRAMUSCULAR | Status: DC | PRN
  Administered 2017-10-25 (×5): 50 ug via INTRAVENOUS

## 2017-10-25 MED ORDER — SODIUM CHLORIDE 0.9 % IV SOLN
INTRAVENOUS | Status: DC
  Administered 2017-10-25: 13:00:00 via INTRAVENOUS

## 2017-10-25 SURGICAL SUPPLY — 23 items
BALLN LUTONIX DCB 7X40X130 (BALLOONS) ×3
BALLN MUSTANG 7X80X75 (BALLOONS) ×3
BALLN MUSTANG 8X20X75 (BALLOONS) ×3
BALLN MUSTANG 9X40X75 (BALLOONS) ×3
BALLOON LUTONIX DCB 7X40X130 (BALLOONS) ×2 IMPLANT
BALLOON MUSTANG 7X80X75 (BALLOONS) ×2 IMPLANT
BALLOON MUSTANG 8X20X75 (BALLOONS) ×2 IMPLANT
BALLOON MUSTANG 9X40X75 (BALLOONS) ×2 IMPLANT
CATH BEACON 5 .035 40 KMP TP (CATHETERS) ×2 IMPLANT
CATH BEACON 5 .038 40 KMP TP (CATHETERS) ×1
CATH PIG 70CM (CATHETERS) ×3 IMPLANT
COVER PROBE U/S 5X48 (MISCELLANEOUS) ×3 IMPLANT
DEVICE PRESTO INFLATION (MISCELLANEOUS) ×6 IMPLANT
DEVICE STARCLOSE SE CLOSURE (Vascular Products) ×6 IMPLANT
GLIDEWIRE ADV .035X180CM (WIRE) ×3 IMPLANT
PACK ANGIOGRAPHY (CUSTOM PROCEDURE TRAY) ×3 IMPLANT
SHEATH BRITE TIP 5FRX11 (SHEATH) ×3 IMPLANT
SHEATH BRITE TIP 6FRX11 (SHEATH) ×6 IMPLANT
STENT LIFESTAR 8X60X80 (Permanent Stent) ×3 IMPLANT
STENT LIFESTREAM 7X26X80 (Permanent Stent) ×6 IMPLANT
TUBING CONTRAST HIGH PRESS 72 (TUBING) ×3 IMPLANT
WIRE J 3MM .035X145CM (WIRE) ×3 IMPLANT
WIRE MAGIC TOR.035 180C (WIRE) ×3 IMPLANT

## 2017-10-25 NOTE — Op Note (Signed)
[facility] Percutaneous Study/Intervention Procedural Note   Date of Surgery: 10/25/2017  Surgeon(s):DEW,JASON   Assistants:none  Pre-operative Diagnosis: PAD with rest pain right lower extremity  Post-operative diagnosis: Same  Procedure(s) Performed: 1. Ultrasound guidance for vascular access bilateral femoral arteries 2. Catheter placement into aorta from bilateral femoral approaches 3. Aortogram and selective right lower extremity angiogram 4. Kissing balloon stent placement to the proximal common iliac arteries bilaterally with 7 mm diameter by 28 mm length Lifestream stents 5. Stent placement to the distal right common iliac artery down to the iliac bifurcation with an 8 mm diameter by 6 cm length life star stent  6.  Percutaneous transluminal angioplasty of the right external iliac artery with 7 mm diameter by 4 cm length Lutonix drug-coated angioplasty balloon 7. StarClose closure device bilateral femoral arteries  EBL: 10 cc  Contrast: 70 cc  Fluoro Time: 4.3 minutes  Moderate Conscious Sedation Time: approximately 40 minutes using 6 mg of Versed and 250 Mcg of Fentanyl  Indications: Patient is a 62 y.o.male with worsening cyanosis and rest pain of the right foot. The patient has previously had a CT scan showing occlusion of the right iliac system. The patient is brought in for angiography for further evaluation and potential treatment.  Due to the limb threatening nature of the situation, angiogram was performed for attempted limb salvage. Risks and benefits are discussed and informed consent is obtained  Procedure: The patient was identified and appropriate procedural time out was performed. The patient was then placed supine on the table and prepped and draped in the usual sterile fashion.Moderate conscious sedation was administered during a face to  face encounter with the patient throughout the procedure with my supervision of the RN administering medicines and monitoring the patient's vital signs, pulse oximetry, telemetry and mental status throughout from the start of the procedure until the patient was taken to the recovery room. Ultrasound was used to evaluate the left common femoral artery. It was patent . A digital ultrasound image was acquired. A Seldinger needle was used to access the left common femoral artery under direct ultrasound guidance and a permanent image was performed. A 0.035 J wire was advanced without resistance and a 5Fr sheath was placed. Pigtail catheter was placed into the aorta and an AP aortogram was performed. 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  Mild disease within the proximal left common iliac artery.  There was reconstitution in the right femoral or external iliac artery with disease in the external iliac artery on the right as well which appeared occlusive or near occlusive.  The left external iliac artery was normal. I then accessed the right common femoral artery under direct ultrasound guidance without difficulty with a Seldinger needle.  A permanent image was recorded.  A 6 French sheath was placed over the J-wire.  Using this 6 French sheath, selective right lower extremity angiogram was then performed. This demonstrated normal common femoral artery, profunda femoris artery, superficial femoral artery, and popliteal artery.  There was then three-vessel runoff distally.  I upsized to a 6 [place] sheath on the left as kissing stents would be required to protect the left iliac system when we opened the right iliac system.  The patient was systemically heparinized and I then used a Kumpe catheter and the advantage wire to navigate through the right iliac occlusion and confirm intraluminal flow in the aorta.  I then  proceeded with treatment.  7 mm diameter by 28 mm length lifestream covered stents were then deployed in the proximal common iliac arteries bilaterally in a kissing balloon fashion.  On the left, this was slightly undersized and I balloon this with an 8 mm balloon to help ensure wall apposition and later a 9 mm balloon.  There remained residual occlusion in the right common iliac artery down to the iliac bifurcation and the proximal right external iliac artery.  An 8 mm diameter by 6 cm length life star stent was then deployed in the distal right common iliac artery down to the iliac bifurcation.  This was postdilated with a 7 mm balloon.  Completion angiogram showed about a 70% residual stenosis in the right external iliac artery below the previously placed stent.  I treated this lesion with a 7 mm diameter by 4 cm length Lutonix drug-coated angioplasty balloon inflated to 10 atm for 1 minute.  Repeat kissing balloon technique was performed in the proximal right common iliac artery with an 8 mm balloon and a 9 mm balloon in the left common iliac artery both inflated to 12 atm.  Completion angiogram showed less than 20% residual stenosis in either the right or left iliac systems with brisk flow to the femorals. I elected to terminate the procedure.  The left femoral sheath was removed first and exchanged for a StarClose sheath.  A StarClose closure device was deployed in the left femoral artery with excellent hemostatic result.  I then turned my attention to the right side.  The sheath was removed and StarClose closure device was deployed in the right femoral artery with excellent hemostatic result. The patient was taken to the recovery room in stable condition having tolerated the procedure well.  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  Mild disease within the proximal left common iliac artery.   There was reconstitution in the right femoral or external iliac artery with disease in the external iliac artery on the right as well which appeared occlusive or near occlusive.  The left external iliac artery was normal. Right lower Extremity: This demonstrated normal common femoral artery, profunda femoris artery, superficial femoral artery, and popliteal artery.  There was then three-vessel runoff distally.   Disposition: Patient was taken to the recovery room in stable condition having tolerated the procedure well.  Complications: None  Leotis Pain 10/25/2017 2:49 PM   This note was created with Dragon Medical transcription system. Any errors in dictation are purely unintentional.

## 2017-10-25 NOTE — H&P (Signed)
[facility] History & Physical Update  The patient was interviewed and re-examined.  The patient's previous History and Physical has been reviewed and is unchanged.  Right foot with cyanosis and markedly reduced ABI. There is no change in the plan of care. We plan to proceed with the scheduled procedure.  Leotis Pain, MD  10/25/2017, 12:05 PM

## 2017-10-26 ENCOUNTER — Encounter: Payer: Self-pay | Admitting: Vascular Surgery

## 2017-10-26 ENCOUNTER — Inpatient Hospital Stay: Admission: RE | Admit: 2017-10-26 | Payer: Medicare HMO | Source: Ambulatory Visit

## 2017-11-12 ENCOUNTER — Ambulatory Visit: Payer: Self-pay | Admitting: Family Medicine

## 2017-11-19 ENCOUNTER — Ambulatory Visit: Payer: Medicare HMO | Admitting: Family Medicine

## 2017-11-22 ENCOUNTER — Encounter: Payer: Self-pay | Admitting: Family Medicine

## 2017-11-22 ENCOUNTER — Ambulatory Visit (INDEPENDENT_AMBULATORY_CARE_PROVIDER_SITE_OTHER): Payer: Medicare HMO | Admitting: Family Medicine

## 2017-11-22 VITALS — BP 146/80 | HR 90 | Temp 98.4°F | Wt 187.1 lb

## 2017-11-22 DIAGNOSIS — L03031 Cellulitis of right toe: Secondary | ICD-10-CM | POA: Diagnosis not present

## 2017-11-22 DIAGNOSIS — I739 Peripheral vascular disease, unspecified: Secondary | ICD-10-CM | POA: Diagnosis not present

## 2017-11-22 DIAGNOSIS — E1142 Type 2 diabetes mellitus with diabetic polyneuropathy: Secondary | ICD-10-CM | POA: Diagnosis not present

## 2017-11-22 LAB — BAYER DCA HB A1C WAIVED: HB A1C (BAYER DCA - WAIVED): 6.9 % (ref <7.0)

## 2017-11-22 MED ORDER — SULFAMETHOXAZOLE-TRIMETHOPRIM 800-160 MG PO TABS: 1.0000 | ORAL_TABLET | Freq: Two times a day (BID) | ORAL | 0 refills | Status: DC

## 2017-11-22 MED ORDER — OXYCODONE HCL ER 10 MG PO T12A: 10.0000 mg | EXTENDED_RELEASE_TABLET | Freq: Two times a day (BID) | ORAL | 0 refills | Status: DC

## 2017-11-22 NOTE — Progress Notes (Signed)
BP (!) 146/80 (BP Location: Left Arm, Patient Position: Sitting, Cuff Size: Normal)   Pulse 90   Temp 98.4 F (36.9 C)   Wt 187 lb 1 oz (84.9 kg)   SpO2 97%   BMI 27.23 kg/m    Subjective:    Patient ID: Glenn Gerold., male    DOB: 07-Nov-1955, 62 y.o.   MRN: [id number]  HPI: Glenn Sherrer. is a 62 y.o. male  Chief Complaint  Patient presents with  . Diabetes   DIABETES Hypoglycemic episodes:no Polydipsia/polyuria: no Visual disturbance: no changes Chest pain: no Paresthesias: no Glucose Monitoring: no Taking Insulin?: no Blood Pressure Monitoring: not checking Retinal Examination: Up to Date Foot Exam: Up to Date Diabetic Education: Completed Pneumovax: Up to Date Influenza: Up to Date Aspirin: yes  TOE PAIN- Having a lot of pain in his R great toe, hit his toe into a wall on Friday Duration: 3 days Involved toe: rightbig toe  Mechanism of injury: trauma Onset: sudden Severity: severe  Quality: aching and sharp Frequency: constant Radiation: no Aggravating factors: weight bearing and walking  Alleviating factors: nothing  Status: worse Treatments attempted: nothing  Relief with NSAIDs?: no Morning stiffness: no Redness: yes  Bruising: no Swelling: yes Paresthesias / decreased sensation: no Fevers: no   Relevant past medical, surgical, family and social history reviewed and updated as indicated. Interim medical history since our last visit reviewed. Allergies and medications reviewed and updated.  Review of Systems  Constitutional: Negative.   Respiratory: Negative.   Cardiovascular: Negative.   Musculoskeletal: Negative.   Skin: Positive for color change and wound. Negative for pallor and rash.  Neurological: Negative.   Psychiatric/Behavioral: Negative.     Per HPI unless specifically indicated above     Objective:    BP (!) 146/80 (BP Location: Left Arm, Patient Position: Sitting, Cuff Size: Normal)   Pulse 90   Temp 98.4 F  (36.9 C)   Wt 187 lb 1 oz (84.9 kg)   SpO2 97%   BMI 27.23 kg/m   Wt Readings from Last 3 Encounters:  11/22/17 187 lb 1 oz (84.9 kg)  10/25/17 185 lb (83.9 kg)  10/20/17 185 lb 8 oz (84.1 kg)    Physical Exam  Constitutional: He is oriented to person, place, and time. He appears well-developed and well-nourished. No distress.  HENT:  Head: Normocephalic and atraumatic.  Right Ear: Hearing normal.  Left Ear: Hearing normal.  Nose: Nose normal.  Eyes: Conjunctivae and lids are normal. Right eye exhibits no discharge. Left eye exhibits no discharge. No scleral icterus.  Cardiovascular: Normal rate, regular rhythm, normal heart sounds and intact distal pulses. Exam reveals no gallop and no friction rub.  No murmur heard. Pulmonary/Chest: Effort normal and breath sounds normal. No stridor. No respiratory distress. He has no wheezes. He has no rales. He exhibits no tenderness.  Musculoskeletal: Normal range of motion. He exhibits edema and tenderness.  Red, hot R great toe with paronychia and ingrown toenail on the medial border.   Neurological: He is alert and oriented to person, place, and time.  Skin: Skin is warm, dry and intact. Capillary refill takes less than 2 seconds. No rash noted. He is not diaphoretic. There is erythema. No pallor.  Psychiatric: He has a normal mood and affect. His speech is normal and behavior is normal. Judgment and thought content normal. Cognition and memory are normal.  Nursing note and vitals reviewed.   Results for orders placed  or performed during the hospital encounter of 10/25/17  BUN  Result Value Ref Range   BUN 13 6 - 20 mg/dL  Creatinine, serum  Result Value Ref Range   Creatinine, Ser 0.76 0.61 - 1.24 mg/dL   GFR calc non Af Amer >60 >60 mL/min   GFR calc Af Amer >60 >60 mL/min      Assessment & Plan:   Problem List Items Addressed This Visit      Cardiovascular and Mediastinum   PVD (peripheral vascular disease) ([place])    Just had  angiogram done with Dr. Lucky Cowboy. Due to see him again 5/10. Stable. Given issues with PVD, will refer to podiatry for ingrown toenail and paronychia.      Relevant Orders   Ambulatory referral to Podiatry     Endocrine   Diabetic peripheral neuropathy ([place]) - Primary    Under good control with A1c of 6.9- continue current regimen. Continue to monitor. Call with any concerns.       Relevant Orders   [facility] [place] Hb A1c Waived   Ambulatory referral to Podiatry    Other Visit Diagnoses    Paronychia of great toe of right foot       Referral to podiatry made today. Will treat with bactrim. Call with any concerns. Continue to monitor.    Relevant Medications   sulfamethoxazole-trimethoprim (BACTRIM DS,SEPTRA DS) 800-160 MG tablet   Other Relevant Orders   Ambulatory referral to Podiatry   Cellulitis of toe of right foot       Will treat with bactrim. Call with any concerns. Will get him into see podiatry for ?toenail removal and treatment given comorbidities   Relevant Orders   Ambulatory referral to Podiatry       Follow up plan: Return in about 3 months (around 02/21/2018) for follow up DM.

## 2017-11-22 NOTE — Assessment & Plan Note (Signed)
Under good control with A1c of 6.9- continue current regimen. Continue to monitor. Call with any concerns.

## 2017-11-22 NOTE — Assessment & Plan Note (Signed)
Just had angiogram done with Dr. Lucky Cowboy. Due to see him again 5/10. Stable. Given issues with PVD, will refer to podiatry for ingrown toenail and paronychia.

## 2017-11-22 NOTE — Patient Instructions (Signed)
Paronychia  Paronychia is an infection of the skin. It happens near a fingernail or toenail. It may cause pain and swelling around the nail. Usually, it is not serious and it clears up with treatment.  Follow these instructions at home:   Soak the fingers or toes in warm water as told by your doctor. You may be told to do this for 20 minutes, 2-3 times a day.   Keep the area dry when you are not soaking it.   Take medicines only as told by your doctor.   If you were given an antibiotic medicine, finish all of it even if you start to feel better.   Keep the affected area clean.   Do not try to drain a fluid-filled bump yourself.   Wear rubber gloves when putting your hands in water.   Wear gloves if your hands might touch cleaners or chemicals.   Follow your doctor's instructions about:  ? Wound care.  ? Bandage (dressing) changes and removal.  Contact a doctor if:   Your symptoms get worse or do not improve.   You have a fever or chills.   You have redness spreading from the affected area.   You have more fluid, blood, or pus coming from the affected area.   Your finger or knuckle is swollen or is hard to move.  This information is not intended to replace advice given to you by your health care provider. Make sure you discuss any questions you have with your health care provider.  Document Released: 07/08/2009 Document Revised: 12/26/2015 Document Reviewed: 06/27/2014  Elsevier Interactive Patient Education  2018 Elsevier Inc[employer]

## 2017-11-29 ENCOUNTER — Telehealth: Payer: Self-pay | Admitting: Family Medicine

## 2017-11-29 MED ORDER — SULFAMETHOXAZOLE-TRIMETHOPRIM 800-160 MG PO TABS: 1.0000 | ORAL_TABLET | Freq: Two times a day (BID) | ORAL | 0 refills | Status: DC

## 2017-11-29 NOTE — Telephone Encounter (Signed)
Copied from [place] [id number]. Topic: Quick Communication - See Telephone Encounter >> Nov 29, 2017  3:08 PM Vernona Rieger wrote: CRM for notification. See Telephone encounter for: 11/29/17.  Patients wife called and said that Dr Wynetta Emery told him to call if he needed more sulfamethoxazole-trimethoprim (BACTRIM DS,SEPTRA DS) 800-160 MG tablet in regards to his toe. Please advise.  [id number] - [place], [place] - [address]

## 2017-11-29 NOTE — Telephone Encounter (Signed)
Rx sent to his pharmacy

## 2017-11-29 NOTE — Telephone Encounter (Signed)
Patients wife notified

## 2017-12-02 DIAGNOSIS — L03031 Cellulitis of right toe: Secondary | ICD-10-CM | POA: Diagnosis not present

## 2017-12-02 DIAGNOSIS — E119 Type 2 diabetes mellitus without complications: Secondary | ICD-10-CM | POA: Diagnosis not present

## 2017-12-02 DIAGNOSIS — L97411 Non-pressure chronic ulcer of right heel and midfoot limited to breakdown of skin: Secondary | ICD-10-CM | POA: Diagnosis not present

## 2017-12-02 DIAGNOSIS — E1142 Type 2 diabetes mellitus with diabetic polyneuropathy: Secondary | ICD-10-CM | POA: Diagnosis not present

## 2017-12-08 ENCOUNTER — Other Ambulatory Visit (INDEPENDENT_AMBULATORY_CARE_PROVIDER_SITE_OTHER): Payer: Self-pay | Admitting: Vascular Surgery

## 2017-12-08 DIAGNOSIS — I739 Peripheral vascular disease, unspecified: Secondary | ICD-10-CM

## 2017-12-10 ENCOUNTER — Ambulatory Visit (INDEPENDENT_AMBULATORY_CARE_PROVIDER_SITE_OTHER): Payer: Medicare HMO

## 2017-12-10 ENCOUNTER — Ambulatory Visit (INDEPENDENT_AMBULATORY_CARE_PROVIDER_SITE_OTHER): Payer: Medicare HMO | Admitting: Vascular Surgery

## 2017-12-10 ENCOUNTER — Encounter (INDEPENDENT_AMBULATORY_CARE_PROVIDER_SITE_OTHER): Payer: Self-pay | Admitting: Vascular Surgery

## 2017-12-10 VITALS — BP 133/77 | HR 82 | Resp 12 | Ht 70.5 in | Wt 185.0 lb

## 2017-12-10 DIAGNOSIS — E1142 Type 2 diabetes mellitus with diabetic polyneuropathy: Secondary | ICD-10-CM

## 2017-12-10 DIAGNOSIS — R69 Illness, unspecified: Secondary | ICD-10-CM | POA: Diagnosis not present

## 2017-12-10 DIAGNOSIS — I739 Peripheral vascular disease, unspecified: Secondary | ICD-10-CM | POA: Diagnosis not present

## 2017-12-10 DIAGNOSIS — F17210 Nicotine dependence, cigarettes, uncomplicated: Secondary | ICD-10-CM | POA: Diagnosis not present

## 2017-12-10 DIAGNOSIS — E782 Mixed hyperlipidemia: Secondary | ICD-10-CM | POA: Diagnosis not present

## 2017-12-10 DIAGNOSIS — Z720 Tobacco use: Secondary | ICD-10-CM

## 2017-12-10 NOTE — Progress Notes (Signed)
MRN : [id number]  [name]. is a 62 y.o. (1956-02-16) male who presents with chief complaint of  Chief Complaint  Patient presents with  . Follow-up    6 weeks ABI/[facility]  .  History of Present Illness: Patient returns today in follow up of PAD.  He is having no pain.  He had an ulcer on the lateral aspect of the right heel which seems to be improving per his wife.  He does have some discoloration in toes 4 and 5 on the right.  He underwent kissing balloon stents and right external iliac stent about 3 to 4 weeks ago now.  ABIs today are 1.2 bilaterally.  Digital pressure is 100 or greater bilaterally.   Current Outpatient Medications  Medication Sig Dispense Refill  . apixaban (ELIQUIS) 5 MG TABS tablet Take 1 tablet (5 mg total) by mouth 2 (two) times daily. 180 tablet 1  . aspirin 81 MG EC tablet Take 1 tablet (81 mg total) by mouth daily. 90 tablet 4  . B Complex Vitamins (VITAMIN B COMPLEX PO) Take 1 tablet by mouth daily.     . Biotin (BIOTIN 5000) 5 MG CAPS Take 1 capsule by mouth daily.    . cholecalciferol (VITAMIN D) 1000 units tablet Take 1,000 Units by mouth daily.    Marland Kitchen diltiazem (CARDIZEM CD) 240 MG 24 hr capsule Take 1 capsule (240 mg total) by mouth daily. 90 capsule 1  . empagliflozin (JARDIANCE) 10 MG TABS tablet Take 10 mg by mouth daily. (Patient taking differently: Take 5 mg daily by mouth. ) 90 tablet 3  . metFORMIN (GLUCOPHAGE) 1000 MG tablet Take 1 tablet (1,000 mg total) by mouth 2 (two) times daily with a meal. 180 tablet 3  . Multiple Vitamin (MULTIVITAMIN WITH MINERALS) TABS tablet Take 1 tablet by mouth daily. 30 tablet 0  . oxyCODONE (OXYCONTIN) 10 mg 12 hr tablet Take 1 tablet (10 mg total) by mouth every 12 (twelve) hours. 20 tablet 0  . simvastatin (ZOCOR) 40 MG tablet TAKE 1 TABLET (40 MG TOTAL) BY MOUTH AT BEDTIME. 90 tablet 3  . sulfamethoxazole-trimethoprim (BACTRIM DS,SEPTRA DS) 800-160 MG tablet Take 1 tablet by mouth 2 (two) times daily. 14  tablet 0  . vitamin C (ASCORBIC ACID) 500 MG tablet Take 500 mg by mouth daily.    . Vitamins/Minerals TABS Take by mouth.    . ciclopirox (PENLAC) 8 % solution Apply over nail at Bedtime and surrounding skin. Apply daily over previous coat. After seven (7) days, may remove with alcohol and continue (Patient not taking: Reported on 12/10/2017) 6.6 mL 0   No current facility-administered medications for this visit.     Past Medical History:  Diagnosis Date  . A-fib ([place])   . Diabetes ([place])   . History of hernia repair   . Hyperlipidemia   . Hypertension   . Legally blind   . Peripheral vascular disease ([place])   . Stroke [facility])     Past Surgical History:  Procedure Laterality Date  . APPENDECTOMY    . HERNIA REPAIR    . LOWER EXTREMITY ANGIOGRAPHY Right 10/25/2017   Procedure: LOWER EXTREMITY ANGIOGRAPHY;  Surgeon: [name], MD;  Location: [facility];  Service: Cardiovascular;  Laterality: Right;  . LOWER EXTREMITY INTERVENTION  10/25/2017   Procedure: LOWER EXTREMITY INTERVENTION;  Surgeon: [name], MD;  Location: [facility];  Service: Cardiovascular;;   Social History Social History  Tobacco Use  . Smoking status: Current Every Day Smoker    Packs/day: 0.50    Years: 30.00    Pack years: 15.00    Types: Cigarettes  . Smokeless tobacco: Never Used  Substance Use Topics  . Alcohol use: Yes    Alcohol/week: 4.2 oz    Types: 7 Cans of beer per week    Comment: occasionally  . Drug use: No    Family History      Family History  Problem Relation Age of Onset  . Diabetes Father   . Hypertension Father   . Hyperlipidemia Father           Allergies  Allergen Reactions  . Lipitor [Atorvastatin] Rash  . Gabapentin Other (See Comments)    Other reaction(s): Dizziness     REVIEW OF SYSTEMS (Negative unless checked)  Constitutional: []Weight loss  []Fever  []Chills Cardiac: []Chest pain   []Chest  pressure   []Palpitations   []Shortness of breath when laying flat   []Shortness of breath at rest   []Shortness of breath with exertion. Vascular:  [x]Pain in legs with walking   []Pain in legs at rest   []Pain in legs when laying flat   []Claudication   []Pain in feet when walking  []Pain in feet at rest  []Pain in feet when laying flat   []History of DVT   []Phlebitis   []Swelling in legs   []Varicose veins   []Non-healing ulcers Pulmonary:   []Uses home oxygen   []Productive cough   []Hemoptysis   []Wheeze  []COPD   []Asthma Neurologic:  []Dizziness  []Blackouts   []Seizures   [x]History of stroke   []History of TIA  []Aphasia   []Temporary blindness   []Dysphagia   [x]Weakness or numbness in arms   [x]Weakness or numbness in legs Musculoskeletal:  [x]Arthritis   []Joint swelling   []Joint pain   []Low back pain Hematologic:  []Easy bruising  []Easy bleeding   []Hypercoagulable state   []Anemic   Gastrointestinal:  []Blood in stool   []Vomiting blood  []Gastroesophageal reflux/heartburn   []Abdominal pain Genitourinary:  []Chronic kidney disease   []Difficult urination  []Frequent urination  []Burning with urination   []Hematuria Skin:  []Rashes   []Ulcers   []Wounds Psychological:  []History of anxiety   [] History of major depression.   Physical Examination  BP 133/77 (BP Location: Right Arm, Patient Position: Sitting)   Pulse 82   Resp 12   Ht 5' 10.5" (1.791 m)   Wt 185 lb (83.9 kg)   BMI 26.17 kg/m  Gen:  WD/WN, NAD.  Appears older than stated age Head: [place]/AT, No temporalis wasting. Ear/Nose/Throat: Hearing grossly intact, nares w/o erythema or drainage Eyes: Conjunctiva clear. Sclera non-icteric Neck: Supple.  Trachea midline Pulmonary:  Good air movement, no use of accessory muscles.  Cardiac: RRR, no JVD Vascular:  Vessel Right Left  Radial Palpable Palpable                          PT  1+ palpable Palpable  DP Palpable Palpable   Musculoskeletal: M/S 5/5  throughout.  No deformity or atrophy.  Mild discoloration on the top of the foot and on toes 4 and 5.  Dressing over an ingrown toenail that was removed.  Small scab on the right lateral heel which is improving.  No significant edema. Neurologic: Sensation grossly intact in extremities.  Symmetrical.  Speech is fluent.  Psychiatric: Judgment intact, Mood & affect appropriate for pt's clinical situation. Dermatologic: No rashes or ulcers noted.  No cellulitis or open wounds.       Labs Recent Results (from the past 2160 hour(s))  BUN     Status: None   Collection Time: 10/25/17 12:53 PM  Result Value Ref Range   BUN 13 6 - 20 mg/dL    Comment: Performed at [facility], [address] [place], [place] [address]  Creatinine, serum     Status: None   Collection Time: 10/25/17 12:53 PM  Result Value Ref Range   Creatinine, Ser 0.76 0.61 - 1.24 mg/dL   GFR calc non Af Amer >60 >60 mL/min   GFR calc Af Amer >60 >60 mL/min    Comment: (NOTE) The eGFR has been calculated using the CKD EPI equation. This calculation has not been validated in all clinical situations. eGFR's persistently <60 mL/min signify possible Chronic Kidney Disease. Performed at [facility], [address] [place], [place] [address]   [facility] Hb A1c Waived     Status: None   Collection Time: 11/22/17  3:36 PM  Result Value Ref Range   [facility] Hb A1c Waived 6.9 <7.0 %    Comment:                                       Diabetic Adult            <7.0                                       Healthy Adult        4.3 - 5.7                                                           (DCCT/NGSP) American Diabetes Association's Summary of Glycemic Recommendations for Adults with Diabetes: Hemoglobin A1c <7.0%. More stringent glycemic goals (A1c <6.0%) may further reduce complications at the cost of increased risk of hypoglycemia.     Radiology No results  found.  Assessment/Plan Hypertension blood pressure control important in reducing the progression of atherosclerotic disease. On appropriate oral medications.   Stroke [facility]) Seems to be recovering reasonably well.  Hard to discern how much of his residual symptoms are related to his PAD and how much may be related to his stroke.  Overall, they are not disabling.  Diabetic peripheral neuropathy ([name]) blood glucose control important in reducing the progression of atherosclerotic disease. Also, involved in wound healing. On appropriate medications.    Tobacco abuse We had a discussion for approximately 3 minutes regarding the absolute need for smoking cessation due to the deleterious nature of tobacco on the vascular system. We discussed the tobacco use would diminish patency of any intervention, and likely significantly worsen progressio of disease. We discussed multiple agents for quitting including replacement therapy or medications to reduce cravings such as Chantix. The patient voices their understanding of the importance of smoking cessation.  PVD (peripheral vascular disease) ([name]) ABIs today are 1.2 bilaterally.  Digital pressure is 100 or greater bilaterally.  All in all,  his perfusion is markedly improved.  At this point, I will see him back in about 3 to 4 months with ABIs.  He should stop smoking, control his sugars, and modify his risk factors as tolerated.  Continue current medical regimen including aspirin and Eliquis.    Leotis Pain, MD  12/10/2017 10:13 AM    This note was created with Dragon medical transcription system.  Any errors from dictation are purely unintentional

## 2017-12-10 NOTE — Patient Instructions (Signed)

## 2017-12-10 NOTE — Assessment & Plan Note (Signed)
ABIs today are 1.2 bilaterally.  Digital pressure is 100 or greater bilaterally.  All in all, his perfusion is markedly improved.  At this point, I will see him back in about 3 to 4 months with ABIs.  He should stop smoking, control his sugars, and modify his risk factors as tolerated.  Continue current medical regimen including aspirin and Eliquis.

## 2017-12-10 NOTE — Assessment & Plan Note (Signed)

## 2017-12-16 DIAGNOSIS — L03031 Cellulitis of right toe: Secondary | ICD-10-CM | POA: Diagnosis not present

## 2017-12-16 DIAGNOSIS — E119 Type 2 diabetes mellitus without complications: Secondary | ICD-10-CM | POA: Diagnosis not present

## 2017-12-30 DIAGNOSIS — E1142 Type 2 diabetes mellitus with diabetic polyneuropathy: Secondary | ICD-10-CM | POA: Diagnosis not present

## 2017-12-30 DIAGNOSIS — L03031 Cellulitis of right toe: Secondary | ICD-10-CM | POA: Diagnosis not present

## 2017-12-30 DIAGNOSIS — E119 Type 2 diabetes mellitus without complications: Secondary | ICD-10-CM | POA: Diagnosis not present

## 2018-01-10 ENCOUNTER — Encounter (INDEPENDENT_AMBULATORY_CARE_PROVIDER_SITE_OTHER): Payer: Self-pay | Admitting: Vascular Surgery

## 2018-01-10 ENCOUNTER — Ambulatory Visit (INDEPENDENT_AMBULATORY_CARE_PROVIDER_SITE_OTHER): Payer: Medicare HMO | Admitting: Vascular Surgery

## 2018-01-10 ENCOUNTER — Other Ambulatory Visit (INDEPENDENT_AMBULATORY_CARE_PROVIDER_SITE_OTHER): Payer: Self-pay | Admitting: Vascular Surgery

## 2018-01-10 ENCOUNTER — Encounter (INDEPENDENT_AMBULATORY_CARE_PROVIDER_SITE_OTHER): Payer: Self-pay

## 2018-01-10 VITALS — BP 137/79 | HR 86 | Resp 16 | Ht 69.5 in | Wt 183.0 lb

## 2018-01-10 DIAGNOSIS — I739 Peripheral vascular disease, unspecified: Secondary | ICD-10-CM

## 2018-01-10 DIAGNOSIS — E782 Mixed hyperlipidemia: Secondary | ICD-10-CM | POA: Diagnosis not present

## 2018-01-10 DIAGNOSIS — E118 Type 2 diabetes mellitus with unspecified complications: Secondary | ICD-10-CM | POA: Diagnosis not present

## 2018-01-10 DIAGNOSIS — Z720 Tobacco use: Secondary | ICD-10-CM | POA: Diagnosis not present

## 2018-01-10 DIAGNOSIS — E119 Type 2 diabetes mellitus without complications: Secondary | ICD-10-CM | POA: Insufficient documentation

## 2018-01-10 NOTE — Progress Notes (Signed)
Subjective:    Patient ID: Glenn Gerold., male    DOB: 09-12-1955, 62 y.o.   MRN: [id number] Chief Complaint  Patient presents with  . Follow-up    right (toes)discoloration   Patient last seen in our office on Dec 10, 2017 status post ultrasound guidance for vascular access bilateral femoral arteries, Catheter placement into aorta from bilateral femoral approaches, Aortogram and selective right lower extremity angiogram, Kissing balloon stent placement to the proximal common iliac arteries bilaterally with 7 mm diameter by 28 mm length Lifestream stents, Stent placement to the distal right common iliac artery down to the iliac bifurcation with an 8 mm diameter by 6 cm length life star stent, Percutaneous transluminal angioplasty of the right external iliac artery with 7 mm diameter by 4 cm length Lutonix drug-coated angioplasty balloon with StarClose closure device bilateral femoral arteries on 10/25/17.  During the patient's last visit, his ABIs were essentially normal.  The patient has been experiencing a dark bluish tint to the fourth and fifth toes on the right foot which is now worsened and started tracking up the lateral aspect of his foot.  This area has also become more painful.  The patient has now started to experience a bluish tint to the big toe on the right foot and is starting to appear on the second toe.  The patient notes that his toes have become more painful and this is what prompted him to seek medical attention.  The patient denies any ulcer formation.  Patient denies any claudication-like symptoms, rest pain or ulceration to the right lower extremity.  Patient denies any fever, nausea vomiting.  Review of Systems  Constitutional: Negative.   HENT: Negative.   Eyes: Negative.   Respiratory: Negative.   Cardiovascular:       PVD  Gastrointestinal: Negative.   Endocrine: Negative.   Genitourinary: Negative.   Musculoskeletal: Negative.   Skin: Negative.     Allergic/Immunologic: Negative.   Neurological: Negative.   Hematological: Negative.   Psychiatric/Behavioral: Negative.       Objective:   Physical Exam  Constitutional: He is oriented to person, place, and time. He appears well-developed and well-nourished. No distress.  HENT:  Head: Normocephalic and atraumatic.  Right Ear: External ear normal.  Left Ear: External ear normal.  Eyes: Pupils are equal, round, and reactive to light. Conjunctivae and EOM are normal.  Neck: Normal range of motion.  Cardiovascular: Normal rate, regular rhythm, normal heart sounds and intact distal pulses.  Pulses:      Radial pulses are 2+ on the right side, and 2+ on the left side.       Dorsalis pedis pulses are 1+ on the left side.       Posterior tibial pulses are 1+ on the left side.  Hard to palpate pedal pulses to the right foot however the foot is warm.  Pulmonary/Chest: Effort normal and breath sounds normal.  Musculoskeletal: Normal range of motion. He exhibits no edema.  Neurological: He is alert and oriented to person, place, and time.  Skin: He is not diaphoretic.  Right foot: Fourth and fifth toes very dark and bluish color which is not tracking up the lateral side of his foot.  These are tender to palpation.  Skin is intact.  Right big toe tip to about mid toe is now bluish with what looks like some mottling proximal to the bluish tint in color.  Skin is intact.  There is no cellulitis to the  right foot.  Psychiatric: He has a normal mood and affect. His behavior is normal. Judgment and thought content normal.  Vitals reviewed.  BP 137/79 (BP Location: Right Arm)   Pulse 86   Resp 16   Ht 5' 9.5" (1.765 m)   Wt 183 lb (83 kg)   BMI 26.64 kg/m   Past Medical History:  Diagnosis Date  . A-fib ([place])   . Diabetes ([place])   . History of hernia repair   . Hyperlipidemia   . Hypertension   . Legally blind   . Peripheral vascular disease ([place])   . Stroke [facility])    Social History    Socioeconomic History  . Marital status: Married    Spouse name: Not on file  . Number of children: 0  . Years of education: Not on file  . Highest education level: Not on file  Occupational History  . Not on file  Social Needs  . Financial resource strain: Not on file  . Food insecurity:    Worry: Not on file    Inability: Not on file  . Transportation needs:    Medical: Not on file    Non-medical: Not on file  Tobacco Use  . Smoking status: Current Every Day Smoker    Packs/day: 0.25    Years: 30.00    Pack years: 7.50    Types: Cigarettes  . Smokeless tobacco: Never Used  Substance and Sexual Activity  . Alcohol use: Yes    Alcohol/week: 4.2 oz    Types: 7 Cans of beer per week    Comment: occasionally  . Drug use: No  . Sexual activity: Yes  Lifestyle  . Physical activity:    Days per week: Not on file    Minutes per session: Not on file  . Stress: Not on file  Relationships  . Social connections:    Talks on phone: Not on file    Gets together: Not on file    Attends religious service: Not on file    Active member of club or organization: Not on file    Attends meetings of clubs or organizations: Not on file    Relationship status: Not on file  . Intimate partner violence:    Fear of current or ex partner: Not on file    Emotionally abused: Not on file    Physically abused: Not on file    Forced sexual activity: Not on file  Other Topics Concern  . Not on file  Social History Narrative  . Not on file   Past Surgical History:  Procedure Laterality Date  . APPENDECTOMY    . HERNIA REPAIR    . LOWER EXTREMITY ANGIOGRAPHY Right 10/25/2017   Procedure: LOWER EXTREMITY ANGIOGRAPHY;  Surgeon: [name], MD;  Location: [place] [facility];  Service: Cardiovascular;  Laterality: Right;  . LOWER EXTREMITY INTERVENTION  10/25/2017   Procedure: LOWER EXTREMITY INTERVENTION;  Surgeon: [name], MD;  Location: [place] [facility];  Service:  Cardiovascular;;   Family History  Problem Relation Age of Onset  . Diabetes Father   . Hypertension Father   . Hyperlipidemia Father    Allergies  Allergen Reactions  . Lipitor [Atorvastatin] Rash  . Gabapentin Other (See Comments)    Other reaction(s): Dizziness      Assessment & Plan:  Patient last seen in our office on Dec 10, 2017 status post ultrasound guidance for vascular access bilateral femoral arteries, Catheter placement into aorta  from bilateral femoral approaches, Aortogram and selective right lower extremity angiogram, Kissing balloon stent placement to the proximal common iliac arteries bilaterally with 7 mm diameter by 28 mm length Lifestream stents, Stent placement to the distal right common iliac artery down to the iliac bifurcation with an 8 mm diameter by 6 cm length life star stent, Percutaneous transluminal angioplasty of the right external iliac artery with 7 mm diameter by 4 cm length Lutonix drug-coated angioplasty balloon with StarClose closure device bilateral femoral arteries on 10/25/17.  During the patient's last visit, his ABIs were essentially normal.  The patient has been experiencing a dark bluish tint to the fourth and fifth toes on the right foot which is now worsened and started tracking up the lateral aspect of his foot.  This area has also become more painful.  The patient has now started to experience a bluish tint to the big toe on the right foot and is starting to appear on the second toe.  The patient notes that his toes have become more painful and this is what prompted him to seek medical attention.  The patient denies any ulcer formation.  Patient denies any claudication-like symptoms, rest pain or ulceration to the right lower extremity.  Patient denies any fever, nausea vomiting.  1. PVD (peripheral vascular disease) ([place]) - Stable Patient with multiple risk factors for peripheral artery disease Patient has had 1 endovascular intervention in the  last few months I do not believe the patient's stents have gone down however there is a worsening in the bluish tint to the patient's fourth and fifth toe which is now tracking up the lateral aspect of his foot.  The patient's big toe is now becoming blue from the tip to approximately mid toe which then transitions to almost an mottling-like appearance proximally. Recommend a right lower extremity angiogram with possible intervention to assess the patient's anatomy, contributing peripheral artery disease versus small vessel disease in the foot. Procedure, risks and benefits explained to the patient all questions answered patient wishes to proceed The patient is to continue taking his aspirin and Eliquis.  2. Tobacco abuse - Stable We had a discussion for approximately five minutes regarding the absolute need for smoking cessation due to the deleterious nature of tobacco on the vascular system. We discussed the tobacco use would diminish patency of any intervention, and likely significantly worsen progressio of disease. We discussed multiple agents for quitting including replacement therapy or medications to reduce cravings such as Chantix. The patient voices their understanding of the importance of smoking cessation.  3. Mixed hyperlipidemia - Stable Encouraged good control as its slows the progression of atherosclerotic disease  4. Type 2 diabetes mellitus with complication, unspecified whether long term insulin use ([name]) - Stable Encouraged good control as its slows the progression of atherosclerotic disease  Current Outpatient Medications on File Prior to Visit  Medication Sig Dispense Refill  . apixaban (ELIQUIS) 5 MG TABS tablet Take 1 tablet (5 mg total) by mouth 2 (two) times daily. 180 tablet 1  . aspirin 81 MG EC tablet Take 1 tablet (81 mg total) by mouth daily. 90 tablet 4  . B Complex Vitamins (VITAMIN B COMPLEX PO) Take 1 tablet by mouth daily.     . Biotin (BIOTIN 5000) 5 MG CAPS  Take 1 capsule by mouth daily.    . cholecalciferol (VITAMIN D) 1000 units tablet Take 1,000 Units by mouth daily.    Marland Kitchen diltiazem (CARDIZEM CD) 240 MG 24 hr  capsule Take 1 capsule (240 mg total) by mouth daily. 90 capsule 1  . empagliflozin (JARDIANCE) 10 MG TABS tablet Take 10 mg by mouth daily. (Patient taking differently: Take 5 mg daily by mouth. ) 90 tablet 3  . metFORMIN (GLUCOPHAGE) 1000 MG tablet Take 1 tablet (1,000 mg total) by mouth 2 (two) times daily with a meal. 180 tablet 3  . Multiple Vitamin (MULTIVITAMIN WITH MINERALS) TABS tablet Take 1 tablet by mouth daily. 30 tablet 0  . oxyCODONE (OXYCONTIN) 10 mg 12 hr tablet Take 1 tablet (10 mg total) by mouth every 12 (twelve) hours. 20 tablet 0  . simvastatin (ZOCOR) 40 MG tablet TAKE 1 TABLET (40 MG TOTAL) BY MOUTH AT BEDTIME. 90 tablet 3  . sulfamethoxazole-trimethoprim (BACTRIM DS,SEPTRA DS) 800-160 MG tablet Take 1 tablet by mouth 2 (two) times daily. 14 tablet 0  . vitamin C (ASCORBIC ACID) 500 MG tablet Take 500 mg by mouth daily.    . Vitamins/Minerals TABS Take by mouth.    . ciclopirox (PENLAC) 8 % solution Apply over nail at Bedtime and surrounding skin. Apply daily over previous coat. After seven (7) days, may remove with alcohol and continue (Patient not taking: Reported on 12/10/2017) 6.6 mL 0   No current facility-administered medications on file prior to visit.    There are no Patient Instructions on file for this visit. No follow-ups on file.  KIMBERLY A STEGMAYER, PA-C

## 2018-01-12 ENCOUNTER — Encounter
Admission: RE | Admit: 2018-01-12 | Discharge: 2018-01-12 | Disposition: A | Discharge status: Home / Self Care | Payer: Medicare HMO | Source: Ambulatory Visit | Attending: Vascular Surgery | Admitting: Vascular Surgery

## 2018-01-12 DIAGNOSIS — R69 Illness, unspecified: Secondary | ICD-10-CM | POA: Diagnosis not present

## 2018-01-12 DIAGNOSIS — Z955 Presence of coronary angioplasty implant and graft: Secondary | ICD-10-CM | POA: Diagnosis not present

## 2018-01-12 DIAGNOSIS — Z888 Allergy status to other drugs, medicaments and biological substances status: Secondary | ICD-10-CM | POA: Diagnosis not present

## 2018-01-12 DIAGNOSIS — L97919 Non-pressure chronic ulcer of unspecified part of right lower leg with unspecified severity: Secondary | ICD-10-CM | POA: Diagnosis not present

## 2018-01-12 DIAGNOSIS — Z9889 Other specified postprocedural states: Secondary | ICD-10-CM | POA: Diagnosis not present

## 2018-01-12 DIAGNOSIS — Z7984 Long term (current) use of oral hypoglycemic drugs: Secondary | ICD-10-CM | POA: Diagnosis not present

## 2018-01-12 DIAGNOSIS — I70235 Atherosclerosis of native arteries of right leg with ulceration of other part of foot: Secondary | ICD-10-CM | POA: Diagnosis present

## 2018-01-12 DIAGNOSIS — Z7982 Long term (current) use of aspirin: Secondary | ICD-10-CM | POA: Diagnosis not present

## 2018-01-12 DIAGNOSIS — Z8249 Family history of ischemic heart disease and other diseases of the circulatory system: Secondary | ICD-10-CM | POA: Diagnosis not present

## 2018-01-12 DIAGNOSIS — Z833 Family history of diabetes mellitus: Secondary | ICD-10-CM | POA: Diagnosis not present

## 2018-01-12 DIAGNOSIS — E11621 Type 2 diabetes mellitus with foot ulcer: Secondary | ICD-10-CM | POA: Diagnosis not present

## 2018-01-12 DIAGNOSIS — I10 Essential (primary) hypertension: Secondary | ICD-10-CM | POA: Diagnosis not present

## 2018-01-12 DIAGNOSIS — I4891 Unspecified atrial fibrillation: Secondary | ICD-10-CM | POA: Diagnosis not present

## 2018-01-12 DIAGNOSIS — F17210 Nicotine dependence, cigarettes, uncomplicated: Secondary | ICD-10-CM | POA: Diagnosis not present

## 2018-01-12 DIAGNOSIS — E785 Hyperlipidemia, unspecified: Secondary | ICD-10-CM | POA: Diagnosis not present

## 2018-01-12 DIAGNOSIS — Z79899 Other long term (current) drug therapy: Secondary | ICD-10-CM | POA: Diagnosis not present

## 2018-01-12 DIAGNOSIS — E118 Type 2 diabetes mellitus with unspecified complications: Secondary | ICD-10-CM | POA: Diagnosis not present

## 2018-01-12 DIAGNOSIS — Z8673 Personal history of transient ischemic attack (TIA), and cerebral infarction without residual deficits: Secondary | ICD-10-CM | POA: Diagnosis not present

## 2018-01-12 DIAGNOSIS — H548 Legal blindness, as defined in USA: Secondary | ICD-10-CM | POA: Diagnosis not present

## 2018-01-12 DIAGNOSIS — Z7901 Long term (current) use of anticoagulants: Secondary | ICD-10-CM | POA: Diagnosis not present

## 2018-01-12 LAB — BUN: BUN: 12 mg/dL (ref 6–20)

## 2018-01-12 LAB — CREATININE, SERUM: Creatinine, Ser: 0.67 mg/dL (ref 0.61–1.24)

## 2018-01-12 MED ORDER — SODIUM CHLORIDE 0.9 % IV SOLN
INTRAVENOUS | Status: DC
  Administered 2018-01-13: 08:00:00 via INTRAVENOUS

## 2018-01-12 MED ORDER — FAMOTIDINE 20 MG PO TABS: 40.0000 mg | ORAL_TABLET | ORAL | Status: DC | PRN

## 2018-01-12 MED ORDER — METHYLPREDNISOLONE SODIUM SUCC 125 MG IJ SOLR: 125.0000 mg | INTRAMUSCULAR | Status: DC | PRN

## 2018-01-12 MED ORDER — CEFAZOLIN SODIUM-DEXTROSE 2-4 GM/100ML-% IV SOLN
2.0000 g | Freq: Once | INTRAVENOUS | Status: AC
  Administered 2018-01-13: 2 g via INTRAVENOUS

## 2018-01-13 ENCOUNTER — Encounter
Admission: RE | Disposition: A | Discharge status: Home / Self Care | Payer: Self-pay | Source: Ambulatory Visit | Attending: Vascular Surgery

## 2018-01-13 ENCOUNTER — Other Ambulatory Visit (INDEPENDENT_AMBULATORY_CARE_PROVIDER_SITE_OTHER): Payer: Self-pay

## 2018-01-13 ENCOUNTER — Ambulatory Visit
Admission: RE | Admit: 2018-01-13 | Discharge: 2018-01-13 | Disposition: A | Discharge status: Home / Self Care | Payer: Medicare HMO | Source: Ambulatory Visit | Attending: Vascular Surgery | Admitting: Vascular Surgery

## 2018-01-13 ENCOUNTER — Encounter: Payer: Self-pay | Admitting: *Deleted

## 2018-01-13 ENCOUNTER — Telehealth: Payer: Self-pay | Admitting: Cardiovascular Disease

## 2018-01-13 DIAGNOSIS — F17210 Nicotine dependence, cigarettes, uncomplicated: Secondary | ICD-10-CM | POA: Insufficient documentation

## 2018-01-13 DIAGNOSIS — E118 Type 2 diabetes mellitus with unspecified complications: Secondary | ICD-10-CM | POA: Insufficient documentation

## 2018-01-13 DIAGNOSIS — Z7984 Long term (current) use of oral hypoglycemic drugs: Secondary | ICD-10-CM | POA: Insufficient documentation

## 2018-01-13 DIAGNOSIS — L97919 Non-pressure chronic ulcer of unspecified part of right lower leg with unspecified severity: Secondary | ICD-10-CM | POA: Insufficient documentation

## 2018-01-13 DIAGNOSIS — H548 Legal blindness, as defined in USA: Secondary | ICD-10-CM | POA: Diagnosis not present

## 2018-01-13 DIAGNOSIS — Z955 Presence of coronary angioplasty implant and graft: Secondary | ICD-10-CM | POA: Insufficient documentation

## 2018-01-13 DIAGNOSIS — I4891 Unspecified atrial fibrillation: Secondary | ICD-10-CM | POA: Diagnosis not present

## 2018-01-13 DIAGNOSIS — E11621 Type 2 diabetes mellitus with foot ulcer: Secondary | ICD-10-CM | POA: Diagnosis not present

## 2018-01-13 DIAGNOSIS — Z888 Allergy status to other drugs, medicaments and biological substances status: Secondary | ICD-10-CM | POA: Insufficient documentation

## 2018-01-13 DIAGNOSIS — E785 Hyperlipidemia, unspecified: Secondary | ICD-10-CM | POA: Diagnosis not present

## 2018-01-13 DIAGNOSIS — Z8249 Family history of ischemic heart disease and other diseases of the circulatory system: Secondary | ICD-10-CM | POA: Insufficient documentation

## 2018-01-13 DIAGNOSIS — I739 Peripheral vascular disease, unspecified: Secondary | ICD-10-CM

## 2018-01-13 DIAGNOSIS — Z7982 Long term (current) use of aspirin: Secondary | ICD-10-CM | POA: Insufficient documentation

## 2018-01-13 DIAGNOSIS — R69 Illness, unspecified: Secondary | ICD-10-CM | POA: Diagnosis not present

## 2018-01-13 DIAGNOSIS — Z833 Family history of diabetes mellitus: Secondary | ICD-10-CM | POA: Insufficient documentation

## 2018-01-13 DIAGNOSIS — I10 Essential (primary) hypertension: Secondary | ICD-10-CM | POA: Insufficient documentation

## 2018-01-13 DIAGNOSIS — Z9889 Other specified postprocedural states: Secondary | ICD-10-CM | POA: Insufficient documentation

## 2018-01-13 DIAGNOSIS — Z7901 Long term (current) use of anticoagulants: Secondary | ICD-10-CM | POA: Insufficient documentation

## 2018-01-13 DIAGNOSIS — Z79899 Other long term (current) drug therapy: Secondary | ICD-10-CM | POA: Insufficient documentation

## 2018-01-13 DIAGNOSIS — I70235 Atherosclerosis of native arteries of right leg with ulceration of other part of foot: Secondary | ICD-10-CM | POA: Diagnosis not present

## 2018-01-13 DIAGNOSIS — Z8673 Personal history of transient ischemic attack (TIA), and cerebral infarction without residual deficits: Secondary | ICD-10-CM | POA: Insufficient documentation

## 2018-01-13 DIAGNOSIS — I70238 Atherosclerosis of native arteries of right leg with ulceration of other part of lower right leg: Secondary | ICD-10-CM | POA: Diagnosis not present

## 2018-01-13 HISTORY — PX: LOWER EXTREMITY ANGIOGRAPHY: CATH118251

## 2018-01-13 LAB — GLUCOSE, CAPILLARY: Glucose-Capillary: 165 mg/dL — ABNORMAL HIGH (ref 65–99)

## 2018-01-13 SURGERY — LOWER EXTREMITY ANGIOGRAPHY
Anesthesia: Moderate Sedation | Laterality: Right

## 2018-01-13 MED ORDER — LIDOCAINE-EPINEPHRINE (PF) 1 %-1:200000 IJ SOLN
INTRAMUSCULAR | Status: AC
  Filled 2018-01-13: qty 30

## 2018-01-13 MED ORDER — FENTANYL CITRATE (PF) 100 MCG/2ML IJ SOLN
INTRAMUSCULAR | Status: AC
  Filled 2018-01-13: qty 2

## 2018-01-13 MED ORDER — DIPHENHYDRAMINE HCL 50 MG/ML IJ SOLN
INTRAMUSCULAR | Status: DC | PRN
  Administered 2018-01-13: 50 mg via INTRAVENOUS

## 2018-01-13 MED ORDER — MIDAZOLAM HCL 5 MG/5ML IJ SOLN
INTRAMUSCULAR | Status: AC
  Filled 2018-01-13: qty 5

## 2018-01-13 MED ORDER — ACETAMINOPHEN 325 MG PO TABS: 650.0000 mg | ORAL_TABLET | ORAL | Status: DC | PRN

## 2018-01-13 MED ORDER — HEPARIN SODIUM (PORCINE) 1000 UNIT/ML IJ SOLN
INTRAMUSCULAR | Status: DC | PRN
  Administered 2018-01-13: 2500 [IU] via INTRAVENOUS

## 2018-01-13 MED ORDER — IOPAMIDOL (ISOVUE-300) INJECTION 61%
INTRAVENOUS | Status: DC | PRN
  Administered 2018-01-13: 35 mL via INTRA_ARTERIAL

## 2018-01-13 MED ORDER — SODIUM CHLORIDE 0.9 % IV SOLN: INTRAVENOUS | Status: DC

## 2018-01-13 MED ORDER — METOPROLOL TARTRATE 5 MG/5ML IV SOLN: 5.0000 mg | Freq: Once | INTRAVENOUS | Status: DC

## 2018-01-13 MED ORDER — MORPHINE SULFATE (PF) 2 MG/ML IV SOLN
INTRAVENOUS | Status: AC
  Filled 2018-01-13: qty 1

## 2018-01-13 MED ORDER — LABETALOL HCL 5 MG/ML IV SOLN: 10.0000 mg | INTRAVENOUS | Status: DC | PRN

## 2018-01-13 MED ORDER — MIDAZOLAM HCL 2 MG/2ML IJ SOLN
INTRAMUSCULAR | Status: DC | PRN
  Administered 2018-01-13 (×2): 2 mg via INTRAVENOUS

## 2018-01-13 MED ORDER — SODIUM CHLORIDE 0.9% FLUSH: 3.0000 mL | Freq: Two times a day (BID) | INTRAVENOUS | Status: DC

## 2018-01-13 MED ORDER — CEFAZOLIN SODIUM-DEXTROSE 2-4 GM/100ML-% IV SOLN
INTRAVENOUS | Status: AC
  Filled 2018-01-13: qty 100

## 2018-01-13 MED ORDER — HYDRALAZINE HCL 20 MG/ML IJ SOLN: 5.0000 mg | INTRAMUSCULAR | Status: DC | PRN

## 2018-01-13 MED ORDER — SODIUM CHLORIDE 0.9% FLUSH: 3.0000 mL | INTRAVENOUS | Status: DC | PRN

## 2018-01-13 MED ORDER — DIPHENHYDRAMINE HCL 50 MG/ML IJ SOLN
INTRAMUSCULAR | Status: AC
  Filled 2018-01-13: qty 1

## 2018-01-13 MED ORDER — HEPARIN (PORCINE) IN NACL 1000-0.9 UT/500ML-% IV SOLN
INTRAVENOUS | Status: AC
  Filled 2018-01-13: qty 1000

## 2018-01-13 MED ORDER — MORPHINE SULFATE (PF) 2 MG/ML IV SOLN
2.0000 mg | Freq: Once | INTRAVENOUS | Status: AC
  Administered 2018-01-13: 2 mg via INTRAVENOUS

## 2018-01-13 MED ORDER — ONDANSETRON HCL 4 MG/2ML IJ SOLN
4.0000 mg | Freq: Four times a day (QID) | INTRAMUSCULAR | Status: DC | PRN
Start: 2018-01-13 — End: 2018-01-13

## 2018-01-13 MED ORDER — ASPIRIN EC 81 MG PO TBEC
81.0000 mg | DELAYED_RELEASE_TABLET | Freq: Every day | ORAL | Status: DC
  Filled 2018-01-13 (×2): qty 1

## 2018-01-13 MED ORDER — SODIUM CHLORIDE 0.9 % IV SOLN: 250.0000 mL | INTRAVENOUS | Status: DC | PRN

## 2018-01-13 MED ORDER — HEPARIN SODIUM (PORCINE) 1000 UNIT/ML IJ SOLN
INTRAMUSCULAR | Status: AC
  Filled 2018-01-13: qty 1

## 2018-01-13 MED ORDER — METOPROLOL TARTRATE 5 MG/5ML IV SOLN
INTRAVENOUS | Status: AC
  Administered 2018-01-13: 09:00:00
  Filled 2018-01-13: qty 5

## 2018-01-13 MED ORDER — FENTANYL CITRATE (PF) 100 MCG/2ML IJ SOLN
INTRAMUSCULAR | Status: DC | PRN
  Administered 2018-01-13 (×2): 50 ug via INTRAVENOUS

## 2018-01-13 SURGICAL SUPPLY — 13 items
CATH BEACON 5 .035 65 RIM TIP (CATHETERS) ×2 IMPLANT
CATH PIG 70CM (CATHETERS) ×2 IMPLANT
CATH VS15FR (CATHETERS) ×2 IMPLANT
DEVICE STARCLOSE SE CLOSURE (Vascular Products) ×2 IMPLANT
DEVICE TORQUE .025-.038 (MISCELLANEOUS) ×2 IMPLANT
GLIDECATH 4FR STR (CATHETERS) ×2 IMPLANT
GLIDEWIRE STIFF .35X180X3 HYDR (WIRE) ×2 IMPLANT
GUIDEWIRE ANGLED .035 180CM (WIRE) ×2 IMPLANT
PACK ANGIOGRAPHY (CUSTOM PROCEDURE TRAY) ×2 IMPLANT
SHEATH BRITE TIP 5FRX11 (SHEATH) ×2 IMPLANT
SYR MEDRAD MARK V 150ML (SYRINGE) ×2 IMPLANT
TUBING CONTRAST HIGH PRESS 72 (TUBING) ×2 IMPLANT
WIRE J 3MM .035X145CM (WIRE) ×2 IMPLANT

## 2018-01-13 NOTE — Progress Notes (Signed)
Left a message with Jeannene Patella, Dr Donivan Scull nurse, on request of the patient and his wife. Notifying that patient was in afib upon arrival to [facility] with rate in 150s, NSR with rate in 80s post procedure. Pam to pass message to Dr Rockey Situ and will contact patient if follow up necessary.

## 2018-01-13 NOTE — Op Note (Signed)
[facility]  Percutaneous Study/Intervention Procedural Note   Date of Surgery: 01/13/2018  Surgeon(s):Chetan Mehring    Assistants:none  Pre-operative Diagnosis: PAD with ulceration right lower extremity, status post previous interventions with persistent right foot pain  Post-operative diagnosis:  Same  Procedure(s) Performed:             1.  Ultrasound guidance for vascular access left femoral artery             2.  Catheter placement into right SFA from left femoral approach             3.  Aortogram and selective right lower extremity angiogram             4.  StarClose closure device left femoral artery  EBL: 5 cc  Contrast: 45 cc  Fluoro Time: 5.7 minutes  Moderate Conscious Sedation Time: approximately 25 minutes using 4 mg of Versed and 100 Mcg of Fentanyl              Indications:  Patient is a 62 y.o.male with significant peripheral arterial disease status post previous iliac intervention for ulceration and rest pain of the right foot.  He still has cyanosis of toes on his right foot and signs of rest pain. The patient is brought in for angiography for further evaluation and potential treatment.  Due to the limb threatening nature of the situation, angiogram was performed for attempted limb salvage. The patient is aware that if the procedure fails, amputation would be expected.  The patient also understands that even with successful revascularization, amputation may still be required due to the severity of the situation. Risks and benefits are discussed and informed consent is obtained.   Procedure:  The patient was identified and appropriate procedural time out was performed.  The patient was then placed supine on the table and prepped and draped in the usual sterile fashion. Moderate conscious sedation was administered during a face to face encounter with the patient throughout the procedure with my supervision of the RN administering medicines and  monitoring the patient's vital signs, pulse oximetry, telemetry and mental status throughout from the start of the procedure until the patient was taken to the recovery room. Ultrasound was used to evaluate the left common femoral artery.  It was patent .  A digital ultrasound image was acquired.  A Seldinger needle was used to access the left common femoral artery under direct ultrasound guidance and a permanent image was performed.  A 0.035 J wire was advanced without resistance and a 5Fr sheath was placed.  Pigtail catheter was placed into the aorta and an AP aortogram was performed. This demonstrated normal renal arteries and normal aorta and iliac segments without significant stenosis.  The previously placed stents in both iliac arteries were patent with no significant residual stenosis.  I then crossed the aortic bifurcation and advanced to the right femoral head and down into the right superficial femoral artery to help opacify the distal flow to visualize the tibial vessels.  Crossing the bifurcation was somewhat difficult due to his previous iliac stent placement, and ultimately required a rim catheter, a floppy Glidewire, and then exchanged for the 4 French glide sheath that was advanced eventually into the superficial femoral artery. Selective right lower extremity angiogram was then performed. This demonstrated normal common femoral artery, profunda femoris artery, superficial femoral artery, and popliteal artery with minimal disease.  There was then three-vessel tibial runoff although both the posterior tibial and  anterior tibial arteries essentially occluded in the foot and the peroneal artery had its usual course with termination at the ankle.  There were significant collaterals filling the foot but is only disease was small vessel disease within the foot.  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  No peripheral arterial disease requiring intervention today.  I elected to terminate the procedure. The sheath was removed and StarClose closure device was deployed in the left femoral artery with excellent hemostatic result. The patient was taken to the recovery room in stable condition having tolerated the procedure well.  Findings:               Aortogram:  This demonstrated normal renal arteries and normal aorta and iliac segments without significant stenosis.  The previously placed stents in both iliac arteries were patent with no significant residual stenosis.             Right lower Extremity:  This demonstrated normal common femoral artery, profunda femoris artery, superficial femoral artery, and popliteal artery with minimal disease.  There was then three-vessel tibial runoff although both the posterior tibial and anterior tibial arteries essentially occluded in the foot and the peroneal artery had its usual course with termination at the ankle.  There were significant collaterals filling the foot but is only disease was small vessel disease within the foot.  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  No peripheral arterial disease requiring intervention today.   Disposition: Patient was taken to the recovery room in stable condition having tolerated the procedure well.  Complications: None  Leotis Pain 01/13/2018 9:49 AM   This note was created with Dragon Medical transcription system. Any errors in dictation are purely unintentional.

## 2018-01-13 NOTE — H&P (Signed)
[facility] History & Physical Update  The patient was interviewed and re-examined.  The patient's previous History and Physical has been reviewed and is unchanged.  There is no change in the plan of care. We plan to proceed with the scheduled procedure.  Leotis Pain, MD  01/13/2018, 8:42 AM

## 2018-01-13 NOTE — Telephone Encounter (Signed)
Erin from recovery called stating that when patient came in today for his procedure his heart rate was 150-160's and in Afib. They did administer IV metoprolol which helped slow it down some and then at some point during his procedure he converted back to normal sinus rhythm. His heart rate is now in the 80's and reviewed current medications. Will route message to [name] for further review and recommendations.

## 2018-01-14 NOTE — Telephone Encounter (Signed)
Can we check which medication he took that AM? Diltiazem 240? If missing doses, needs to stay on dilt and not miss doses If he took diltiazem, he might benefit from a zio monitor

## 2018-01-14 NOTE — Telephone Encounter (Signed)
No answer. Left message to call back.   

## 2018-01-19 ENCOUNTER — Ambulatory Visit
Admission: RE | Admit: 2018-01-19 | Discharge: 2018-01-19 | Disposition: A | Discharge status: Home / Self Care | Payer: Medicare HMO | Source: Ambulatory Visit | Attending: Vascular Surgery | Admitting: Vascular Surgery

## 2018-01-19 ENCOUNTER — Ambulatory Visit: Admission: RE | Admit: 2018-01-19 | Payer: Medicare HMO | Source: Ambulatory Visit

## 2018-01-19 DIAGNOSIS — I739 Peripheral vascular disease, unspecified: Secondary | ICD-10-CM | POA: Insufficient documentation

## 2018-01-19 DIAGNOSIS — L89610 Pressure ulcer of right heel, unstageable: Secondary | ICD-10-CM | POA: Diagnosis not present

## 2018-01-25 ENCOUNTER — Ambulatory Visit (INDEPENDENT_AMBULATORY_CARE_PROVIDER_SITE_OTHER): Payer: Medicare HMO | Admitting: Vascular Surgery

## 2018-01-25 ENCOUNTER — Encounter (INDEPENDENT_AMBULATORY_CARE_PROVIDER_SITE_OTHER): Payer: Self-pay | Admitting: Vascular Surgery

## 2018-01-25 VITALS — BP 125/78 | HR 72 | Resp 16 | Ht 70.0 in | Wt 182.0 lb

## 2018-01-25 DIAGNOSIS — E782 Mixed hyperlipidemia: Secondary | ICD-10-CM | POA: Diagnosis not present

## 2018-01-25 DIAGNOSIS — I739 Peripheral vascular disease, unspecified: Secondary | ICD-10-CM | POA: Diagnosis not present

## 2018-01-25 DIAGNOSIS — E1142 Type 2 diabetes mellitus with diabetic polyneuropathy: Secondary | ICD-10-CM

## 2018-01-25 NOTE — Patient Instructions (Signed)

## 2018-01-25 NOTE — Assessment & Plan Note (Signed)
He had some recent noninvasive studies in the hospital which showed no significant arterial insufficiency in either lower extremity confirming his previous angiogram as well as our noninvasive studies from earlier this year.  His angiogram demonstrated only small vessel disease within the foot.  We cannot improve his perfusion anymore, and at this point if his toes developed significant ulcerations or he has severe pain he may ultimately require some digital amputations that should heal from a vascular standpoint.  He already follows with podiatry and they will be monitoring this.  I will see him back in 3 to 4 months with noninvasive studies.

## 2018-01-25 NOTE — Progress Notes (Signed)
MRN : [id number]  [name]. is a 62 y.o. ([birth year]) male who presents with chief complaint of  Chief Complaint  Patient presents with  . Follow-up    u/s results   .  History of Present Illness: Patient returns today in follow up of his PAD.  He continues to have cyanosis of multiple toes on his right foot.  His wife reports that has generally been better although there are little darker today.  He reports he quit smoking last week and we congratulated him on this.  He had some recent noninvasive studies in the hospital which showed no significant arterial insufficiency in either lower extremity confirming his previous angiogram as well as our noninvasive studies from earlier this year.  Current Outpatient Medications  Medication Sig Dispense Refill  . apixaban (ELIQUIS) 5 MG TABS tablet Take 1 tablet (5 mg total) by mouth 2 (two) times daily. 180 tablet 1  . aspirin 81 MG EC tablet Take 1 tablet (81 mg total) by mouth daily. 90 tablet 4  . B Complex Vitamins (VITAMIN B COMPLEX PO) Take 1 tablet by mouth daily.     . Biotin (BIOTIN 5000) 5 MG CAPS Take 1 capsule by mouth daily.    . cholecalciferol (VITAMIN D) 1000 units tablet Take 1,000 Units by mouth daily.    Marland Kitchen diltiazem (CARDIZEM CD) 240 MG 24 hr capsule Take 1 capsule (240 mg total) by mouth daily. 90 capsule 1  . empagliflozin (JARDIANCE) 10 MG TABS tablet Take 10 mg by mouth daily. (Patient taking differently: Take 5 mg daily by mouth. ) 90 tablet 3  . metFORMIN (GLUCOPHAGE) 1000 MG tablet Take 1 tablet (1,000 mg total) by mouth 2 (two) times daily with a meal. 180 tablet 3  . Multiple Vitamin (MULTIVITAMIN WITH MINERALS) TABS tablet Take 1 tablet by mouth daily. 30 tablet 0  . nicotine (NICODERM CQ - DOSED IN MG/24 HOURS) 14 mg/24hr patch Place 14 mg onto the skin daily.    Marland Kitchen oxyCODONE (OXYCONTIN) 10 mg 12 hr tablet Take 1 tablet (10 mg total) by mouth every 12 (twelve) hours. (Patient taking differently: Take 10 mg by  mouth every 12 (twelve) hours as needed. ) 20 tablet 0  . simvastatin (ZOCOR) 40 MG tablet TAKE 1 TABLET (40 MG TOTAL) BY MOUTH AT BEDTIME. 90 tablet 3  . vitamin C (ASCORBIC ACID) 500 MG tablet Take 500 mg by mouth daily.    . Vitamins/Minerals TABS Take by mouth.    . ciclopirox (PENLAC) 8 % solution Apply over nail at Bedtime and surrounding skin. Apply daily over previous coat. After seven (7) days, may remove with alcohol and continue (Patient not taking: Reported on 12/10/2017) 6.6 mL 0  . sulfamethoxazole-trimethoprim (BACTRIM DS,SEPTRA DS) 800-160 MG tablet Take 1 tablet by mouth 2 (two) times daily. (Patient not taking: Reported on 01/13/2018) 14 tablet 0   No current facility-administered medications for this visit.     Past Medical History:  Diagnosis Date  . A-fib ([place])   . Diabetes ([place])   . History of hernia repair   . Hyperlipidemia   . Hypertension   . Legally blind   . Peripheral vascular disease ([place])   . Stroke [facility])     Past Surgical History:  Procedure Laterality Date  . APPENDECTOMY    . HERNIA REPAIR     UMBILICAL  . LOWER EXTREMITY ANGIOGRAPHY Right 10/25/2017   Procedure: LOWER EXTREMITY ANGIOGRAPHY;  Surgeon: [name],  MD;  Location: [place] [facility];  Service: Cardiovascular;  Laterality: Right;  . LOWER EXTREMITY ANGIOGRAPHY Right 01/13/2018   Procedure: LOWER EXTREMITY ANGIOGRAPHY;  Surgeon: [name], MD;  Location: [place] [facility];  Service: Cardiovascular;  Laterality: Right;  . LOWER EXTREMITY INTERVENTION  10/25/2017   Procedure: LOWER EXTREMITY INTERVENTION;  Surgeon: [name], MD;  Location: [place] [facility];  Service: Cardiovascular;;  . TONSILLECTOMY      Social History Social History   Tobacco Use  . Smoking status: Former Smoker    Packs/day: 0.25    Years: 30.00    Pack years: 7.50    Types: Cigarettes  . Smokeless tobacco: Never Used  Substance Use Topics  . Alcohol use: Yes    Alcohol/week: 4.8 oz    Types:  8 Cans of beer per week    Comment: occasionally  . Drug use: No    Family History Family History  Problem Relation Age of Onset  . Diabetes Father   . Hypertension Father   . Hyperlipidemia Father     Allergies  Allergen Reactions  . Sodium Pentobarbital [Pentobarbital] Shortness Of Breath  . Lipitor [Atorvastatin] Rash  . Gabapentin Other (See Comments)    Other reaction(s): Dizziness    REVIEW OF SYSTEMS(Negative unless checked)  Constitutional: []Weight loss[]Fever[]Chills Cardiac:[]Chest pain[]Chest pressure[]Palpitations []Shortness of breath when laying flat []Shortness of breath at rest []Shortness of breath with exertion. Vascular: [x]Pain in legs with walking[]Pain in legsat rest[]Pain in legs when laying flat []Claudication []Pain in feet when walking []Pain in feet at rest []Pain in feet when laying flat []History of DVT []Phlebitis []Swelling in legs []Varicose veins []Non-healing ulcers Pulmonary: []Uses home oxygen []Productive cough[]Hemoptysis []Wheeze []COPD []Asthma Neurologic: []Dizziness []Blackouts []Seizures [x]History of stroke []History of TIA[]Aphasia []Temporary blindness[]Dysphagia [x]Weaknessor numbness in arms [x]Weakness or numbnessin legs Musculoskeletal: [x]Arthritis []Joint swelling []Joint pain []Low back pain Hematologic:[]Easy bruising[]Easy bleeding []Hypercoagulable state []Anemic  Gastrointestinal:[]Blood in stool[]Vomiting blood[]Gastroesophageal reflux/heartburn[]Abdominal pain Genitourinary: []Chronic kidney disease []Difficulturination []Frequenturination []Burning with urination[]Hematuria Skin: []Rashes []Ulcers []Wounds Psychological: []History of anxiety[]History of major depression.     Physical Examination  BP 125/78 (BP Location: Right Arm)   Pulse 72   Resp 16   Ht 5' 10" (1.778 m)   Wt 182 lb (82.6  kg)   BMI 26.11 kg/m  Gen:  WD/WN, NAD Head: [place]/AT, No temporalis wasting. Ear/Nose/Throat: Hearing grossly intact, nares w/o erythema or drainage Eyes: Conjunctiva clear. Sclera non-icteric Neck: Supple.  Trachea midline Pulmonary:  Good air movement, no use of accessory muscles.  Cardiac: RRR, no JVD Vascular:  Vessel Right Left  Radial Palpable Palpable                          PT Palpable Palpable  DP Palpable Palpable    Musculoskeletal: M/S 5/5 throughout.  No deformity or atrophy.  Cyanosis is present on multiple toes on the right foot as well as the right heel.  No current open ulcerations or infection.  No edema. Neurologic: Sensation grossly intact in extremities.  Symmetrical.  Speech is fluent.  Psychiatric: Judgment intact, Mood & affect appropriate for pt's clinical situation.        Labs Recent Results (from the past 2160 hour(s))  Bayer DCA Hb A1c Waived     Status: None   Collection Time: 11/22/17  3:36 PM  Result Value Ref Range   HB A1C (BAYER DCA - WAIVED) 6.9 <7.0 %  Comment:                                       Diabetic Adult            <7.0                                       Healthy Adult        4.3 - 5.7                                                           (DCCT/NGSP) American Diabetes Association's Summary of Glycemic Recommendations for Adults with Diabetes: Hemoglobin A1c <7.0%. More stringent glycemic goals (A1c <6.0%) may further reduce complications at the cost of increased risk of hypoglycemia.   BUN     Status: None   Collection Time: 01/12/18 10:12 AM  Result Value Ref Range   BUN 12 6 - 20 mg/dL    Comment: Performed at [facility], [address] [place], [place] [address]  Creatinine, serum     Status: None   Collection Time: 01/12/18 10:12 AM  Result Value Ref Range   Creatinine, Ser 0.67 0.61 - 1.24 mg/dL   GFR calc non Af Amer >60 >60 mL/min   GFR calc Af Amer >60 >60 mL/min    Comment: (NOTE) The  eGFR has been calculated using the CKD EPI equation. This calculation has not been validated in all clinical situations. eGFR's persistently <60 mL/min signify possible Chronic Kidney Disease. Performed at [facility], [address] [place], [place] [address]   Glucose, capillary     Status: Abnormal   Collection Time: 01/13/18  7:32 AM  Result Value Ref Range   Glucose-Capillary 165 (H) 65 - 99 mg/dL    Radiology US Arterial Seg Multiple  Result Date: 01/19/2018 CLINICAL DATA:  Right heel ulcer EXAM: NONINVASIVE PHYSIOLOGIC VASCULAR STUDY OF BILATERAL LOWER EXTREMITIES TECHNIQUE: Non-invasive vascular evaluation of both lower extremities was performed at rest, including calculation of ankle-brachial indices, multiple segmental pressure evaluation, segmental Doppler and segmental pulse volume recording. COMPARISON:  None. FINDINGS: Right Lower Extremity Resting ABI:  0.99 Resting TBI: Not applicable Segmental Pressures: Normal segmental pressures, no significant (20 mmHg) pressure gradient between adjacent segments. Great toe pressure: Not performed Arterial Waveforms: Normal tri-phasic arterial waveforms. PVRs: Normal PVRs with maintained waveform amplitude, augmentation and quality. Left Lower Extremity: Resting ABI: 1.09 Resting TBI: Not applicable Segmental Pressures: Normal segmental pressures, no significant (20 mmHg) pressure gradient between adjacent segments. Great toe pressure: Not performed Arterial Waveforms: Normal tri-phasic arterial waveforms. PVRs: Normal PVRs with maintained waveform amplitude, augmentation and quality. Other: Symmetric upper extremity pressures. Ankle Brachial index > 1.4 Non diagnostic secondary to incompressible vessel calcifications 1.0-1.4       Normal 0.9-0.99     Borderline PAD 0.8-0.89     Mild PAD 0.5-0.79     Moderate PAD < 0.5          Severe PAD Toe Brachial Index Normal     >0.65 Moderate  0.53-0.64 Severe     <0.23 Toe Pressures Absolute  toe pressure >  106mHg sufficient for wound healing. Toe pressures <544mg = critical limb ischemia. IMPRESSION: No significant arterial occlusive disease in the lower extremities. Electronically Signed   By: ArMarybelle Killings.D.   On: 01/19/2018 16:11    Assessment/Plan Hypertension blood pressure control important in reducing the progression of atherosclerotic disease. On appropriate oral medications.   [facility] to be [facility] reasonably well. Hard to discern how much of his residual symptoms are related to his PAD and how much may be related to his stroke. Overall, they are not disabling.  Diabetic peripheral neuropathy ([name]) blood glucose control important in reducing the progression of atherosclerotic disease. Also, involved in wound healing. On appropriate medications.    Hyperlipidemia lipid control important in reducing the progression of atherosclerotic disease. Continue statin therapy   PVD (peripheral vascular disease) ([place]usaHe had some recent noninvasive studies in the hospital which showed no significant arterial insufficiency in either lower extremity confirming his previous angiogram as well as our noninvasive studies from earlier this year.  His angiogram demonstrated only small vessel disease within the foot.  We cannot improve his perfusion anymore, and at this point if his toes developed significant ulcerations or he has severe pain he may ultimately require some digital amputations that should heal from a vascular standpoint.  He already follows with podiatry and they will be monitoring this.  I will see him back in 3 to 4 months with noninvasive studies.      JaLeotis PainMD  01/25/2018 12:59 PM    This note was created with Dragon medical transcription system.  Any errors from dictation are purely unintentional

## 2018-01-25 NOTE — Assessment & Plan Note (Signed)
lipid control important in reducing the progression of atherosclerotic disease. Continue statin therapy  

## 2018-01-27 DIAGNOSIS — E119 Type 2 diabetes mellitus without complications: Secondary | ICD-10-CM | POA: Diagnosis not present

## 2018-01-27 DIAGNOSIS — E1152 Type 2 diabetes mellitus with diabetic peripheral angiopathy with gangrene: Secondary | ICD-10-CM | POA: Diagnosis not present

## 2018-01-27 DIAGNOSIS — L03031 Cellulitis of right toe: Secondary | ICD-10-CM | POA: Diagnosis not present

## 2018-02-07 NOTE — Progress Notes (Signed)
Cardiology Office Note  Date:  02/09/2018   ID:  [name]., DOB 12/14/1955, MRN [id number]  PCP:  Glenn Roys, DO   Chief Complaint  Patient presents with  . other    Follow up from A-Fib and the Angiogram. Pt. c/o right foot pain. Meds reviewed by the pt. verbally.     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 diabetes  hyperlipidemia  hypertension  Smoker, 9 cigs a day previous alcohol abuse /alcoholism Who presents for f/u of his atrial fibrillation  Reports that he was recently seen by [name], Recent angioplasty stent placement to improve lower extremity flow  Toes on the right foot are still mottled, poorly healing fourth and fifth toe Wearing slippers today , They appear raw, purple color denies any pain  Asymptomatic atrial fibrillation Does not realize when he is having arrhythmia  Has been maintained on Eliquis since 2018 Denies any further TIA or stroke symptoms    reduced vision because of the stroke , on disability Reports having headaches in the past  EKG personally reviewed by myself on todays visit Shows normal sinus rhythmrate 85 bpm no significant ST or T-wave changes  Other past medical history reviewed Stress test performed showing ejection fraction 50% no ischemia Echocardiogram August 2016 essentially normal with mild MR ejection fraction 55-60% Holter but results are unavailable Carotid ultrasound 2016 with mild plaque  Repeat testing November 2018 Echocardiogram with normal ejection fraction 55-65% Carotid ultrasound less than 50% bilaterally  CT scan runoff/aorta November 2018 1. Occlusion of the  right posterior tibial artery approximately 3 cm above the ankle mortise. 2. Tapered appearance of the right peroneal artery as it approaches the ankle without abrupt occlusion. Patent right dorsalis pedis. 3. Normal left three-vessel runoff to the level of the ankle. 4. 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 5.  Aortic Atherosclerosis   DM HBA1C 6.6  Total chol 151 LDL 67  EKG Jun 21 2017: atrial fib   PMH:   has a past medical history of A-fib ([place]), Diabetes ([place]), History of hernia repair, Hyperlipidemia, Hypertension, Legally blind, Peripheral vascular disease ([place]), and Stroke ([place]).  PSH:    Past Surgical History:  Procedure Laterality Date  . APPENDECTOMY    . HERNIA REPAIR     UMBILICAL  . LOWER EXTREMITY ANGIOGRAPHY Right 10/25/2017   Procedure: LOWER EXTREMITY ANGIOGRAPHY;  Surgeon: Glenn Huxley, MD;  Location: [place] [facility];  Service: Cardiovascular;  Laterality: Right;  . LOWER EXTREMITY ANGIOGRAPHY Right 01/13/2018   Procedure: LOWER EXTREMITY ANGIOGRAPHY;  Surgeon: Glenn Huxley, MD;  Location: [place] [facility];  Service: Cardiovascular;  Laterality: Right;  . LOWER EXTREMITY INTERVENTION  10/25/2017   Procedure: LOWER EXTREMITY INTERVENTION;  Surgeon: Glenn Huxley, MD;  Location: [place] [facility];  Service: Cardiovascular;;  . TONSILLECTOMY      Current Outpatient Medications  Medication Sig Dispense Refill  . apixaban (ELIQUIS) 5 MG TABS tablet Take 1 tablet (5 mg total) by mouth 2 (two) times daily. 180 tablet  1  . aspirin 81 MG EC tablet Take 1 tablet (81 mg total) by mouth daily. 90 tablet 4  . B Complex Vitamins (VITAMIN B COMPLEX PO) Take 1 tablet by mouth daily.     . Biotin (BIOTIN 5000) 5 MG CAPS Take 1 capsule by mouth daily.    . cholecalciferol (VITAMIN D) 1000 units tablet Take  1,000 Units by mouth daily.    . ciclopirox (PENLAC) 8 % solution Apply over nail at Bedtime and surrounding skin. Apply daily over previous coat. After seven (7) days, may remove with alcohol and continue 6.6 mL 0  . diltiazem (CARDIZEM CD) 240 MG 24 hr capsule TAKE 1 CAPSULE BY MOUTH EVERY DAY 90 capsule 1  . empagliflozin (JARDIANCE) 10 MG TABS tablet Take 10 mg by mouth daily. (Patient taking differently: Take 5 mg daily by mouth. ) 90 tablet 3  . metFORMIN (GLUCOPHAGE) 1000 MG tablet Take 1 tablet (1,000 mg total) by mouth 2 (two) times daily with a meal. 180 tablet 3  . Multiple Vitamin (MULTIVITAMIN WITH MINERALS) TABS tablet Take 1 tablet by mouth daily. 30 tablet 0  . nicotine (NICODERM CQ - DOSED IN MG/24 HOURS) 14 mg/24hr patch Place 14 mg onto the skin daily.    . simvastatin (ZOCOR) 40 MG tablet TAKE 1 TABLET (40 MG TOTAL) BY MOUTH AT BEDTIME. 90 tablet 3  . sulfamethoxazole-trimethoprim (BACTRIM DS,SEPTRA DS) 800-160 MG tablet Take 1 tablet by mouth 2 (two) times daily. 14 tablet 0  . traMADol (ULTRAM) 50 MG tablet Take by mouth every 6 (six) hours as needed.    . vitamin C (ASCORBIC ACID) 500 MG tablet Take 500 mg by mouth daily.    . Vitamins/Minerals TABS Take by mouth.    . oxyCODONE (OXYCONTIN) 10 mg 12 hr tablet Take 1 tablet (10 mg total) by mouth every 12 (twelve) hours. (Patient not taking: Reported on 02/09/2018) 20 tablet 0   No current facility-administered medications for this visit.      Allergies:   Sodium pentobarbital [pentobarbital]; Lipitor [atorvastatin]; and Gabapentin   Social History:  The patient  reports that he has quit smoking. His smoking use included cigarettes. He has a 7.50 pack-year smoking history. He has never used smokeless tobacco. He reports that he drinks about 4.8 oz of alcohol per week. He reports that he does not use drugs.   Family History:   family history includes Diabetes in his father; Hyperlipidemia in his father; Hypertension in his  father.    Review of Systems: Review of Systems  Constitutional: Negative.   Respiratory: Negative.   Cardiovascular: Negative.   Gastrointestinal: Negative.   Musculoskeletal: Negative.   Neurological: Negative.   Psychiatric/Behavioral: Negative.   All other systems reviewed and are negative.    PHYSICAL EXAM: VS:  BP 130/70 (BP Location: Left Arm, Patient Position: Sitting, Cuff Size: Normal)   Pulse 85   Ht 5\' 10"  (1.778 m)   Wt 184 lb (83.5 kg)   BMI 26.40 kg/m  , BMI Body mass index is 26.4 kg/m.  Presenting a wheelchair GEN: Well nourished, well developed, in no acute distress  HEENT: normal  Neck: no JVD, carotid bruits, or masses Cardiac: RRR; no murmurs, rubs, or gallops,no edema  Respiratory:  clear to auscultation bilaterally, normal work of breathing GI: soft, nontender, nondistended, + BS MS: no deformity or atrophy  Skin: warm and dry, no rash Right foot examined showing mottled appearing toes herbal discoloration with raw fourth and fifth  toe open sores Also nonhealing small ulcer right side of foot Neuro:  Strength and sensation are intact Psych: euthymic mood, full affect    Recent Labs: 08/20/2017: ALT 20; Hemoglobin 14.0; Platelets 337; Potassium 4.7; Sodium 145; TSH 2.370 01/12/2018: BUN 12; Creatinine, Ser 0.67    Lipid Panel Lab Results  Component Value Date   CHOL 151 08/20/2017   HDL 59 08/20/2017   LDLCALC 67 08/20/2017   TRIG 125 08/20/2017      Wt Readings from Last 3 Encounters:  02/09/18 184 lb (83.5 kg)  01/25/18 182 lb (82.6 kg)  01/13/18 183 lb (83 kg)       ASSESSMENT AND PLAN:  Paroxysmal atrial fibrillation ([name]) - Plan: EKG 12-Lead On Eliquis 5 twice daily Documentation of paroxysmal atrial fibrillation, EKG November 2018 May have contributed to stroke in 2016  No further workup at this time  Continue current medications  PVD (peripheral vascular disease) ([place]) Recent PV intervention by [name] Slow healing  wound on fourth and fifth toe High risk of infection cellulitis osteomyelitis  Diabetic peripheral neuropathy ([place]) Long history of diabetes, smoking and alcohol leading to neuropathy Also with PAD Recently stopped smoking  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 EKG 12-Lead     Signed, Esmond Plants, M.D., Ph.D. 02/09/2018  [place], [place]

## 2018-02-08 ENCOUNTER — Other Ambulatory Visit: Payer: Self-pay | Admitting: Family Medicine

## 2018-02-09 ENCOUNTER — Encounter: Payer: Self-pay | Admitting: Cardiovascular Disease

## 2018-02-09 ENCOUNTER — Ambulatory Visit: Payer: Medicare HMO | Admitting: Cardiovascular Disease

## 2018-02-09 VITALS — BP 130/70 | HR 85 | Ht 70.0 in | Wt 184.0 lb

## 2018-02-09 DIAGNOSIS — G621 Alcoholic polyneuropathy: Secondary | ICD-10-CM

## 2018-02-09 DIAGNOSIS — R69 Illness, unspecified: Secondary | ICD-10-CM | POA: Diagnosis not present

## 2018-02-09 DIAGNOSIS — E1142 Type 2 diabetes mellitus with diabetic polyneuropathy: Secondary | ICD-10-CM

## 2018-02-09 DIAGNOSIS — E782 Mixed hyperlipidemia: Secondary | ICD-10-CM | POA: Diagnosis not present

## 2018-02-09 DIAGNOSIS — Z720 Tobacco use: Secondary | ICD-10-CM

## 2018-02-09 DIAGNOSIS — I739 Peripheral vascular disease, unspecified: Secondary | ICD-10-CM

## 2018-02-09 DIAGNOSIS — I480 Paroxysmal atrial fibrillation: Secondary | ICD-10-CM

## 2018-02-09 MED ORDER — EZETIMIBE 10 MG PO TABS: 10.0000 mg | ORAL_TABLET | Freq: Every day | ORAL | 3 refills | Status: DC

## 2018-02-09 NOTE — Patient Instructions (Signed)
Medication Instructions:   Please start zetia one a day  Labwork:  No new labs needed  Testing/Procedures:  No further testing at this time   Follow-Up: It was a pleasure seeing you in the office today. Please call us if you have new issues that need to be addressed before your next appt.  [phone]  Your physician wants you to follow-up in: 12 months.  You will receive a reminder letter in the mail two months in advance. If you don't receive a letter, please call our office to schedule the follow-up appointment.  If you need a refill on your cardiac medications before your next appointment, please call your pharmacy.  For educational health videos Log in to : [email or web address] Or : [email or web address], password : [email or web address]

## 2018-02-10 ENCOUNTER — Telehealth: Payer: Self-pay | Admitting: Cardiovascular Disease

## 2018-02-10 NOTE — Telephone Encounter (Signed)
Left voicemail message to call back  

## 2018-02-10 NOTE — Telephone Encounter (Signed)
Pt wife calling asking if we can please send in the combo drug  Of Simvastatin and Zetiya She states insurance will cover more if we can please send this in   [facility] [place] [facility]  Please advise

## 2018-02-11 NOTE — Telephone Encounter (Signed)
Patient wife returning call about [facility]

## 2018-02-14 MED ORDER — EZETIMIBE-SIMVASTATIN 10-40 MG PO TABS: 1.0000 | ORAL_TABLET | Freq: Every day | ORAL | 3 refills | Status: DC

## 2018-02-14 NOTE — Telephone Encounter (Signed)
No answer. Left detailed message that Rx for Vytorin sent in to [facility] and if they have any further questions to give [place] a call back.

## 2018-02-21 ENCOUNTER — Encounter: Payer: Self-pay | Admitting: Family Medicine

## 2018-02-21 ENCOUNTER — Ambulatory Visit: Payer: Medicare HMO | Admitting: Family Medicine

## 2018-02-21 VITALS — BP 144/80 | HR 88 | Temp 98.4°F

## 2018-02-21 DIAGNOSIS — E1142 Type 2 diabetes mellitus with diabetic polyneuropathy: Secondary | ICD-10-CM

## 2018-02-21 DIAGNOSIS — I96 Gangrene, not elsewhere classified: Secondary | ICD-10-CM | POA: Diagnosis not present

## 2018-02-21 DIAGNOSIS — E782 Mixed hyperlipidemia: Secondary | ICD-10-CM | POA: Diagnosis not present

## 2018-02-21 DIAGNOSIS — I129 Hypertensive chronic kidney disease with stage 1 through stage 4 chronic kidney disease, or unspecified chronic kidney disease: Secondary | ICD-10-CM

## 2018-02-21 DIAGNOSIS — I739 Peripheral vascular disease, unspecified: Secondary | ICD-10-CM | POA: Diagnosis not present

## 2018-02-21 HISTORY — DX: Gangrene, not elsewhere classified: I96

## 2018-02-21 LAB — BAYER DCA HB A1C WAIVED: HB A1C (BAYER DCA - WAIVED): 6.6 % (ref <7.0)

## 2018-02-21 MED ORDER — ASPIRIN 81 MG PO TBEC: 81.0000 mg | DELAYED_RELEASE_TABLET | Freq: Every day | ORAL | 4 refills | Status: DC

## 2018-02-21 MED ORDER — APIXABAN 5 MG PO TABS: 5.0000 mg | ORAL_TABLET | Freq: Two times a day (BID) | ORAL | 1 refills | Status: DC

## 2018-02-21 MED ORDER — OXYCODONE HCL ER 10 MG PO T12A: 10.0000 mg | EXTENDED_RELEASE_TABLET | Freq: Two times a day (BID) | ORAL | 0 refills | Status: DC

## 2018-02-21 NOTE — Progress Notes (Signed)
BP (!) 144/80 (BP Location: Left Arm, Patient Position: Sitting, Cuff Size: Normal)   Pulse 88   Temp 98.4 F (36.9 C)   SpO2 98%    Subjective:    Patient ID: Glenn Gerold., male    DOB: 1956/03/21, 62 y.o.   MRN: [id number]  HPI: Glenn Rogerson. is a 62 y.o. male  Chief Complaint  Patient presents with  . Hypertension  . Hyperlipidemia  . Diabetes   Black and very painful last 2 toes on the right foot. Has followed with vascular and there is nothing else they can do to improve the blood supply. Has been very painful and now ulcerating.   HYPERTENSION / HYPERLIPIDEMIA Satisfied with current treatment? yes Duration of hypertension: chronic BP monitoring frequency: not checking BP medication side effects: no Past BP meds: diltiazem Duration of hyperlipidemia: chronic Cholesterol medication side effects: no Cholesterol supplements: none Past cholesterol medications: vytorin Medication compliance: excellent compliance Aspirin: yes Recent stressors: yes Recurrent headaches: no Visual changes: no Palpitations: no Dyspnea: no Chest pain: no Lower extremity edema: no Dizzy/lightheaded: no  DIABETES Hypoglycemic episodes:no Polydipsia/polyuria: no Visual disturbance: no Chest pain: no Paresthesias: no Glucose Monitoring: yes  Accucheck frequency: Daily Taking Insulin?: no Blood Pressure Monitoring: not checking Retinal Examination: Up to Date Foot Exam: Up to Date Diabetic Education: Completed Pneumovax: Up to Date Influenza: Up to Date Aspirin: yes  Relevant past medical, surgical, family and social history reviewed and updated as indicated. Interim medical history since our last visit reviewed. Allergies and medications reviewed and updated.  Review of Systems  Constitutional: Negative.   Respiratory: Negative.   Cardiovascular: Negative.   Musculoskeletal: Negative.   Skin: Positive for color change and wound. Negative for pallor and rash.    Psychiatric/Behavioral: Negative.     Per HPI unless specifically indicated above     Objective:    BP (!) 144/80 (BP Location: Left Arm, Patient Position: Sitting, Cuff Size: Normal)   Pulse 88   Temp 98.4 F (36.9 C)   SpO2 98%   Wt Readings from Last 3 Encounters:  02/09/18 184 lb (83.5 kg)  01/25/18 182 lb (82.6 kg)  01/13/18 183 lb (83 kg)    Physical Exam  Constitutional: He is oriented to person, place, and time. He appears well-developed and well-nourished. No distress.  HENT:  Head: Normocephalic and atraumatic.  Right Ear: Hearing normal.  Left Ear: Hearing normal.  Nose: Nose normal.  Eyes: Conjunctivae and lids are normal. Right eye exhibits no discharge. Left eye exhibits no discharge. No scleral icterus.  Cardiovascular: Normal rate, regular rhythm, normal heart sounds and intact distal pulses. Exam reveals no gallop and no friction rub.  No murmur heard. Pulmonary/Chest: Effort normal and breath sounds normal. No stridor. No respiratory distress. He has no wheezes. He has no rales. He exhibits no tenderness.  Musculoskeletal: Normal range of motion.  Neurological: He is alert and oriented to person, place, and time.  Skin: Skin is intact. No rash noted. He is not diaphoretic.  Black and blue 4th and 5th toes on the R with ulcerations  Psychiatric: He has a normal mood and affect. His speech is normal and behavior is normal. Judgment and thought content normal. Cognition and memory are impaired. He exhibits abnormal recent memory. He exhibits normal remote memory.  Nursing note and vitals reviewed.   Results for orders placed or performed in visit on 02/21/18  Bayer DCA Hb A1c Waived  Result Value Ref  Range   HB A1C (BAYER DCA - WAIVED) 6.6 <7.0 %      Assessment & Plan:   Problem List Items Addressed This Visit      Cardiovascular and Mediastinum   PVD (peripheral vascular disease) ([place])    Likely going to lose the last 2 toes. Will get him into  podiatry for evaluation and to see if there is anything they can do. Appointment scheduled for tomorrow.       Relevant Medications   apixaban (ELIQUIS) 5 MG TABS tablet   aspirin 81 MG EC tablet     Endocrine   Diabetic peripheral neuropathy ([name]) - Primary    A1c under good control at 6.6. Continue current regimen. Continue to monitor. Call with any concerns.       Relevant Medications   aspirin 81 MG EC tablet   Other Relevant Orders   Bayer DCA Hb A1c Waived (Completed)   Comprehensive metabolic panel     Genitourinary   Benign hypertensive renal disease    BP under fair control given his pain. Will continue current regimen and continue to monitor.       Relevant Orders   CBC with Differential/Platelet   Comprehensive metabolic panel     Other   Hyperlipidemia    Rechecking levels today. Await results. Continue current regimen. Call with any concerns.       Relevant Medications   apixaban (ELIQUIS) 5 MG TABS tablet   aspirin 81 MG EC tablet   Other Relevant Orders   Comprehensive metabolic panel   Lipid Panel w/o Chol/HDL Ratio   Necrotic toes ([name])    Likely going to lose the last 2 toes. Will get him into podiatry for evaluation and to see if there is anything they can do. Appointment scheduled for tomorrow.          Follow up plan: Return in about 3 months (around 05/24/2018).

## 2018-02-21 NOTE — Patient Instructions (Signed)
Appointment with Dr. Vickki Muff 3PM Tuesday 02/22/18 Address: [address] [place], [place] [address]  Phone: [phone]

## 2018-02-21 NOTE — Assessment & Plan Note (Signed)
A1c under good control at 6.6. Continue current regimen. Continue to monitor. Call with any concerns.

## 2018-02-21 NOTE — Assessment & Plan Note (Signed)
Rechecking levels today. Await results. Continue current regimen. Call with any concerns.

## 2018-02-21 NOTE — Assessment & Plan Note (Signed)
BP under fair control given his pain. Will continue current regimen and continue to monitor.

## 2018-02-21 NOTE — Assessment & Plan Note (Signed)
Likely going to lose the last 2 toes. Will get him into podiatry for evaluation and to see if there is anything they can do. Appointment scheduled for tomorrow.

## 2018-02-22 ENCOUNTER — Telehealth: Payer: Self-pay | Admitting: Family Medicine

## 2018-02-22 DIAGNOSIS — E1152 Type 2 diabetes mellitus with diabetic peripheral angiopathy with gangrene: Secondary | ICD-10-CM | POA: Diagnosis not present

## 2018-02-22 DIAGNOSIS — I96 Gangrene, not elsewhere classified: Secondary | ICD-10-CM | POA: Diagnosis not present

## 2018-02-22 LAB — CBC WITH DIFFERENTIAL/PLATELET
Basophils Absolute: 0 10*3/uL (ref 0.0–0.2)
Basos: 0 %
EOS (ABSOLUTE): 0.3 10*3/uL (ref 0.0–0.4)
Eos: 3 %
Hematocrit: 39.3 % (ref 37.5–51.0)
Hemoglobin: 12.8 g/dL — ABNORMAL LOW (ref 13.0–17.7)
IMMATURE GRANS (ABS): 0 10*3/uL (ref 0.0–0.1)
IMMATURE GRANULOCYTES: 0 %
LYMPHS: 18 %
Lymphocytes Absolute: 1.9 10*3/uL (ref 0.7–3.1)
MCH: 25.4 pg — AB (ref 26.6–33.0)
MCHC: 32.6 g/dL (ref 31.5–35.7)
MCV: 78 fL — ABNORMAL LOW (ref 79–97)
Monocytes Absolute: 0.5 10*3/uL (ref 0.1–0.9)
Monocytes: 5 %
NEUTROS PCT: 74 %
Neutrophils Absolute: 7.4 10*3/uL — ABNORMAL HIGH (ref 1.4–7.0)
PLATELETS: 278 10*3/uL (ref 150–450)
RBC: 5.04 x10E6/uL (ref 4.14–5.80)
RDW: 15.9 % — ABNORMAL HIGH (ref 12.3–15.4)
WBC: 10.1 10*3/uL (ref 3.4–10.8)

## 2018-02-22 LAB — COMPREHENSIVE METABOLIC PANEL
A/G RATIO: 1.6 (ref 1.2–2.2)
ALK PHOS: 104 IU/L (ref 39–117)
ALT: 13 IU/L (ref 0–44)
AST: 13 IU/L (ref 0–40)
Albumin: 4.7 g/dL (ref 3.6–4.8)
BUN/Creatinine Ratio: 20 (ref 10–24)
BUN: 15 mg/dL (ref 8–27)
Bilirubin Total: 0.3 mg/dL (ref 0.0–1.2)
CO2: 20 mmol/L (ref 20–29)
CREATININE: 0.74 mg/dL — AB (ref 0.76–1.27)
Calcium: 10.1 mg/dL (ref 8.6–10.2)
Chloride: 98 mmol/L (ref 96–106)
GFR calc Af Amer: 113 mL/min/{1.73_m2} (ref >59)
GFR calc non Af Amer: 98 mL/min/{1.73_m2} (ref >59)
GLOBULIN, TOTAL: 2.9 g/dL (ref 1.5–4.5)
Glucose: 118 mg/dL — ABNORMAL HIGH (ref 65–99)
POTASSIUM: 4.2 mmol/L (ref 3.5–5.2)
SODIUM: 137 mmol/L (ref 134–144)
Total Protein: 7.6 g/dL (ref 6.0–8.5)

## 2018-02-22 LAB — LIPID PANEL W/O CHOL/HDL RATIO
CHOLESTEROL TOTAL: 141 mg/dL (ref 100–199)
HDL: 58 mg/dL (ref >39)
LDL Calculated: 54 mg/dL (ref 0–99)
TRIGLYCERIDES: 143 mg/dL (ref 0–149)
VLDL Cholesterol Cal: 29 mg/dL (ref 5–40)

## 2018-02-22 NOTE — Telephone Encounter (Signed)
Please let [name] know that his labs came back normal. Thanks!

## 2018-03-03 ENCOUNTER — Other Ambulatory Visit
Admission: RE | Admit: 2018-03-03 | Discharge: 2018-03-03 | Disposition: A | Discharge status: Home / Self Care | Payer: Medicare HMO | Source: Ambulatory Visit | Attending: Nurse Practitioner | Admitting: Nurse Practitioner

## 2018-03-03 ENCOUNTER — Other Ambulatory Visit: Payer: Self-pay | Admitting: Nurse Practitioner

## 2018-03-03 ENCOUNTER — Encounter: Payer: Medicare HMO | Attending: Nurse Practitioner | Admitting: Nurse Practitioner

## 2018-03-03 ENCOUNTER — Ambulatory Visit
Admission: RE | Admit: 2018-03-03 | Discharge: 2018-03-03 | Disposition: A | Discharge status: Home / Self Care | Payer: Medicare HMO | Source: Ambulatory Visit | Attending: Nurse Practitioner | Admitting: Nurse Practitioner

## 2018-03-03 ENCOUNTER — Ambulatory Visit
Admission: EM | Admit: 2018-03-03 | Discharge: 2018-03-03 | Disposition: A | Discharge status: Home / Self Care | Payer: Medicare HMO | Source: Ambulatory Visit | Attending: Nurse Practitioner | Admitting: Nurse Practitioner

## 2018-03-03 DIAGNOSIS — E11621 Type 2 diabetes mellitus with foot ulcer: Secondary | ICD-10-CM | POA: Insufficient documentation

## 2018-03-03 DIAGNOSIS — Z888 Allergy status to other drugs, medicaments and biological substances status: Secondary | ICD-10-CM | POA: Insufficient documentation

## 2018-03-03 DIAGNOSIS — R230 Cyanosis: Secondary | ICD-10-CM | POA: Insufficient documentation

## 2018-03-03 DIAGNOSIS — E1151 Type 2 diabetes mellitus with diabetic peripheral angiopathy without gangrene: Secondary | ICD-10-CM | POA: Diagnosis not present

## 2018-03-03 DIAGNOSIS — R238 Other skin changes: Secondary | ICD-10-CM | POA: Diagnosis not present

## 2018-03-03 DIAGNOSIS — M7989 Other specified soft tissue disorders: Secondary | ICD-10-CM | POA: Insufficient documentation

## 2018-03-03 DIAGNOSIS — L97519 Non-pressure chronic ulcer of other part of right foot with unspecified severity: Secondary | ICD-10-CM | POA: Insufficient documentation

## 2018-03-03 DIAGNOSIS — S91104A Unspecified open wound of right lesser toe(s) without damage to nail, initial encounter: Secondary | ICD-10-CM | POA: Diagnosis not present

## 2018-03-03 DIAGNOSIS — S91101A Unspecified open wound of right great toe without damage to nail, initial encounter: Secondary | ICD-10-CM | POA: Diagnosis not present

## 2018-03-03 DIAGNOSIS — R52 Pain, unspecified: Secondary | ICD-10-CM

## 2018-03-03 DIAGNOSIS — M79674 Pain in right toe(s): Secondary | ICD-10-CM | POA: Insufficient documentation

## 2018-03-03 DIAGNOSIS — L089 Local infection of the skin and subcutaneous tissue, unspecified: Secondary | ICD-10-CM | POA: Insufficient documentation

## 2018-03-03 DIAGNOSIS — I480 Paroxysmal atrial fibrillation: Secondary | ICD-10-CM | POA: Diagnosis not present

## 2018-03-03 DIAGNOSIS — M79671 Pain in right foot: Secondary | ICD-10-CM | POA: Diagnosis not present

## 2018-03-03 IMAGING — CR DG FOOT COMPLETE 3+V*R*
3 series · 3 of 3 positions shown · non-contrast
Comparison: None.

CLINICAL DATA: Pain and discoloration in the right toes.

EXAM:
RIGHT FOOT COMPLETE - 3+ VIEW

[foot ap]
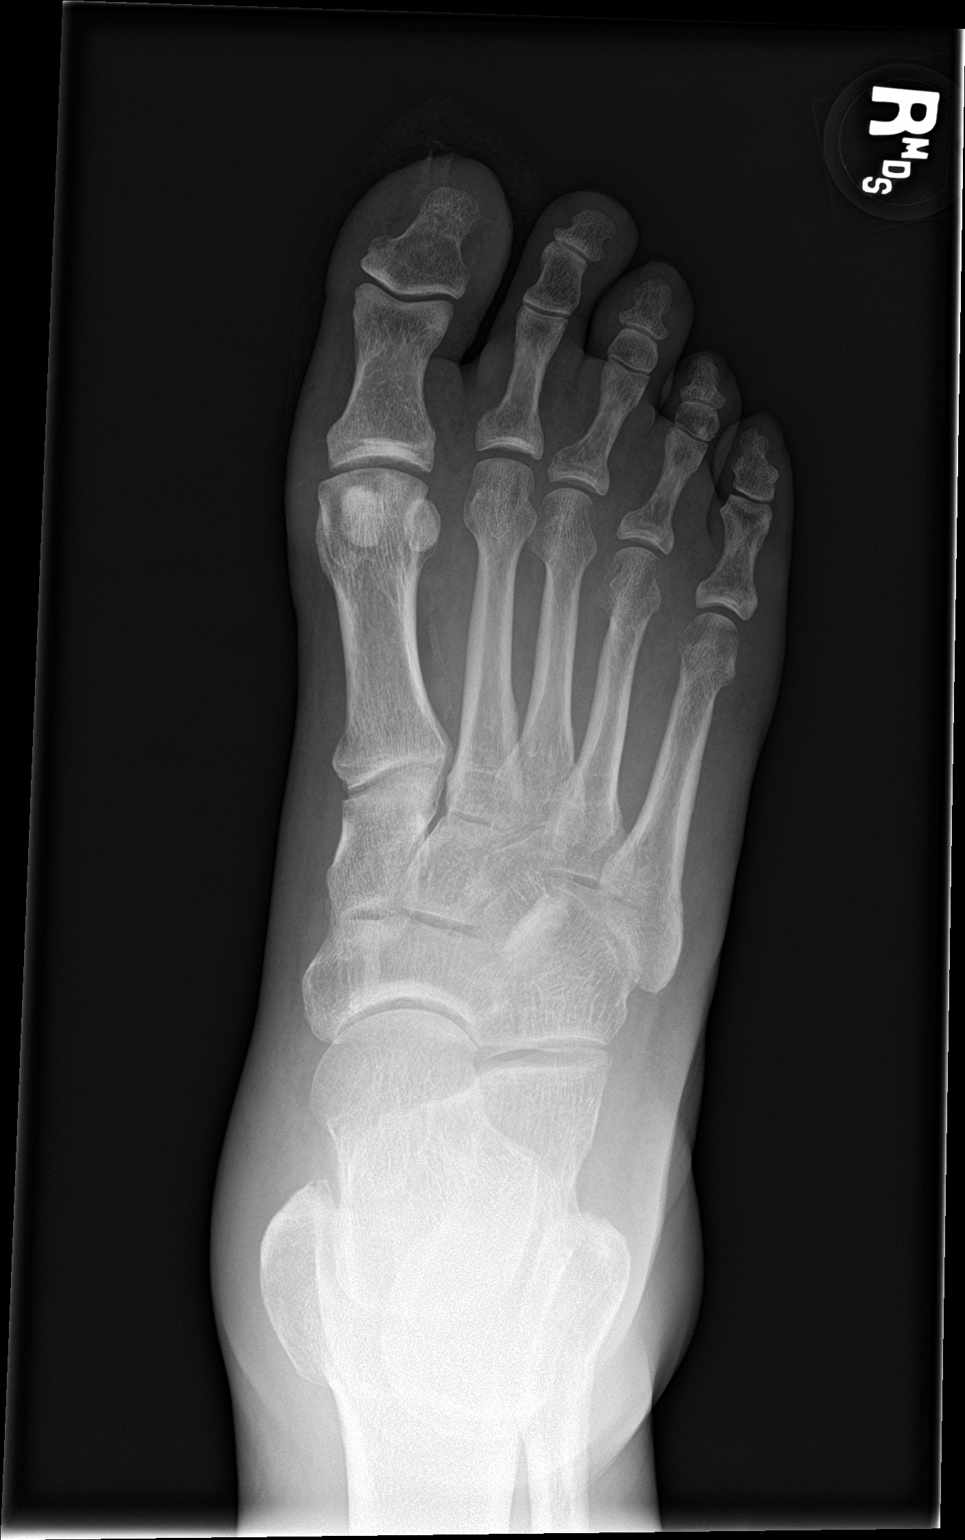

[foot obl]
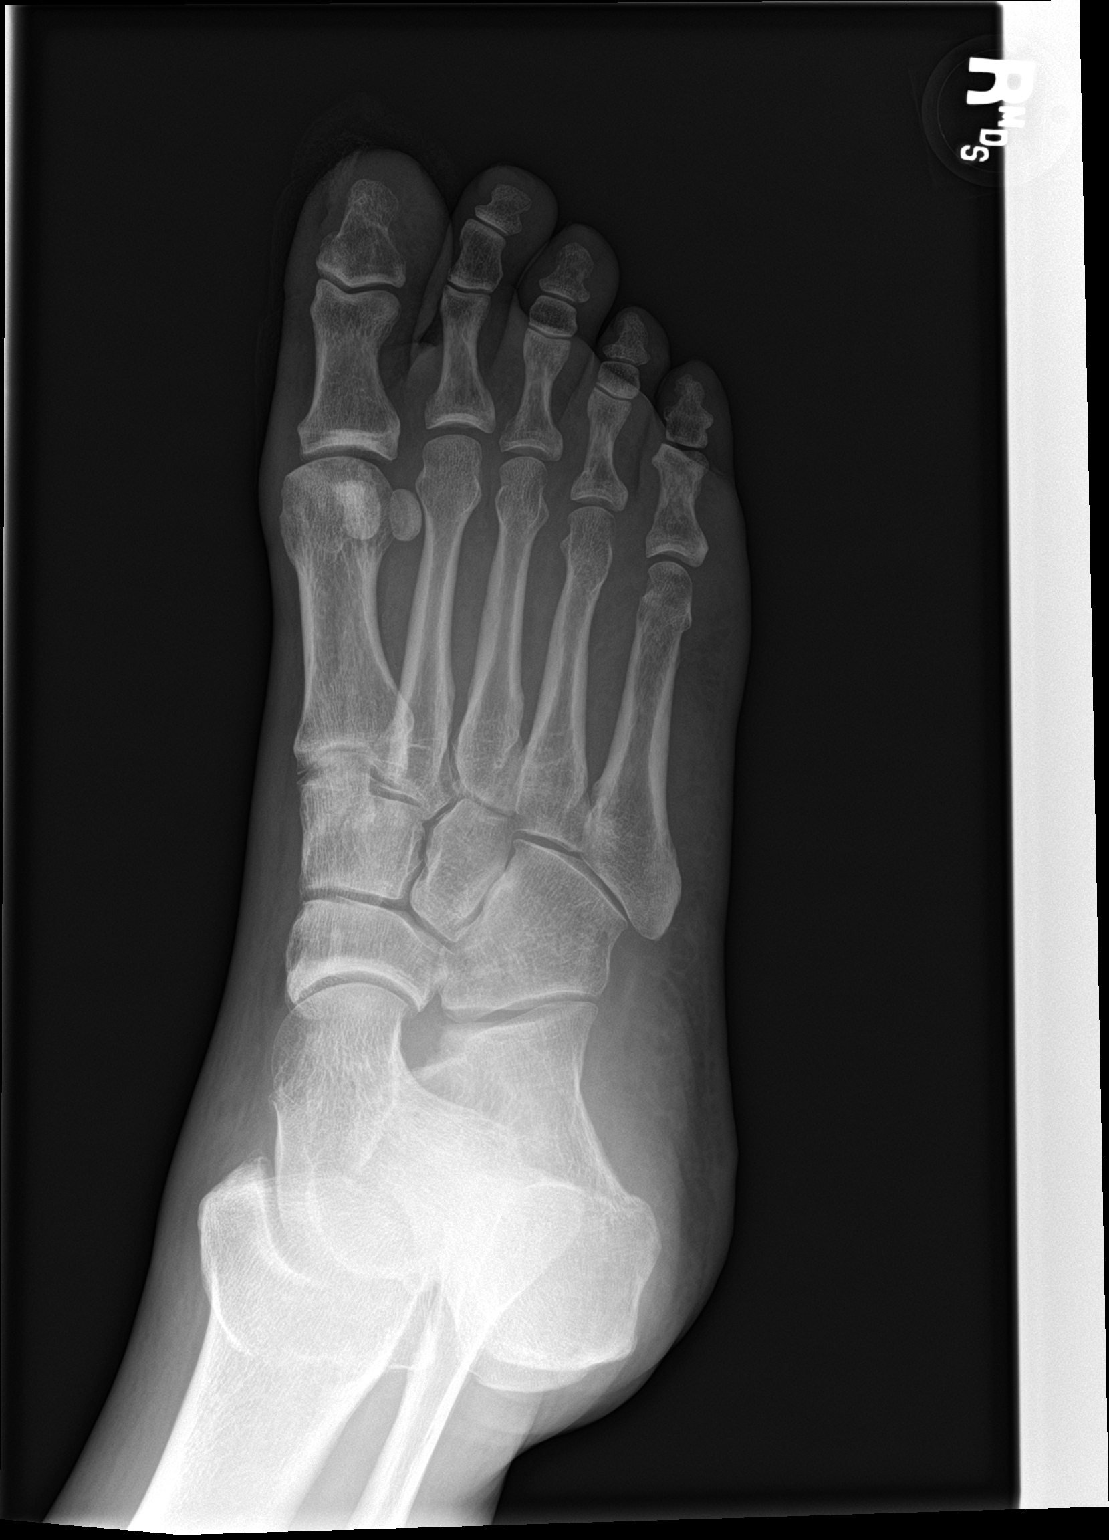

[foot lat]
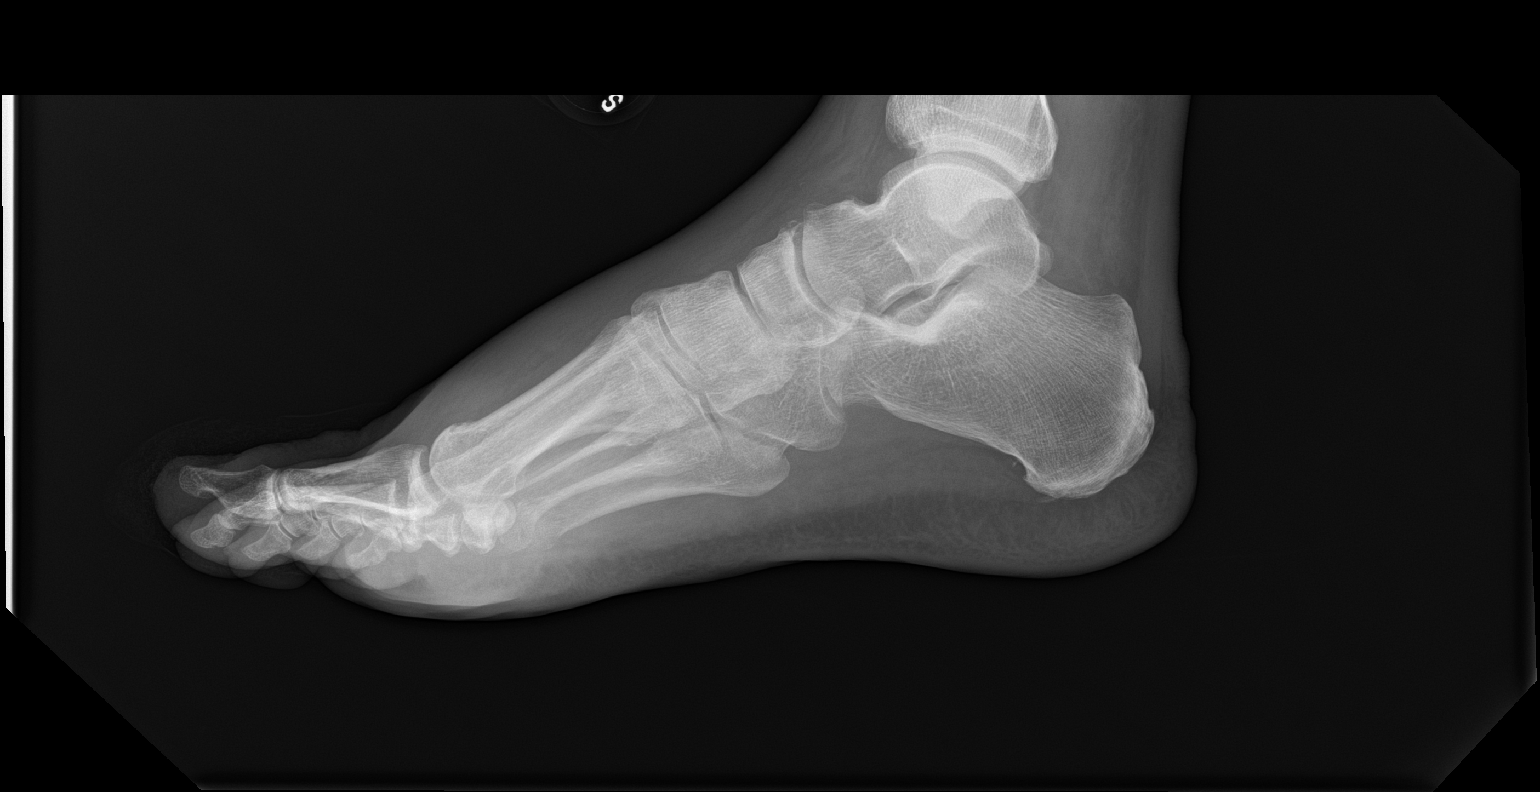

[3 of 3 positions shown; findings below may reference images not displayed]

FINDINGS: There is no evidence of fracture or dislocation. There is no
evidence of arthropathy or other focal bone abnormality. Dorsal soft
tissue swelling of the midfoot.
IMPRESSION: No evidence of acute osseus abnormality.

Dorsal soft tissue swelling of the midfoot.

## 2018-03-06 LAB — AEROBIC CULTURE W GRAM STAIN (SUPERFICIAL SPECIMEN): Culture: NO GROWTH

## 2018-03-06 LAB — AEROBIC CULTURE  (SUPERFICIAL SPECIMEN): GRAM STAIN: NONE SEEN

## 2018-03-07 ENCOUNTER — Telehealth: Payer: Self-pay | Admitting: Family Medicine

## 2018-03-07 NOTE — Telephone Encounter (Signed)
Copied from [place] [id number]. Topic: Quick Communication - Rx Refill/Question >> Mar 07, 2018  3:28 PM Cecelia Byars, NT wrote: Medication: oxyCODONE (OXYCONTIN) 10 mg 12 hr tablet  Has the patient contacted their pharmacy? no (Agent: If no, request that the patient contact the pharmacy for the refill. (Agent: If yes, when and what did the pharmacy advise?  Preferred Pharmacy (with phone number or street name  [id number] - [place], [phone] (Phone) [phone] (Fax)      Agent: Please be advised that RX refills may take up to 3 business days. We ask that you follow-up with your pharmacy.

## 2018-03-08 NOTE — Telephone Encounter (Signed)
Patient will be in tomorrow

## 2018-03-08 NOTE — Telephone Encounter (Signed)
Needs appt

## 2018-03-09 ENCOUNTER — Ambulatory Visit (INDEPENDENT_AMBULATORY_CARE_PROVIDER_SITE_OTHER): Payer: Medicare HMO | Admitting: Family Medicine

## 2018-03-09 ENCOUNTER — Encounter: Payer: Self-pay | Admitting: Family Medicine

## 2018-03-09 VITALS — BP 136/83 | HR 96 | Temp 98.7°F | Ht 70.0 in | Wt 181.5 lb

## 2018-03-09 DIAGNOSIS — I96 Gangrene, not elsewhere classified: Secondary | ICD-10-CM

## 2018-03-09 MED ORDER — OXYCODONE HCL ER 10 MG PO T12A: 10.0000 mg | EXTENDED_RELEASE_TABLET | Freq: Two times a day (BID) | ORAL | 0 refills | Status: DC

## 2018-03-09 NOTE — Assessment & Plan Note (Signed)
Has seen podiatry, scheduled to see wound care and vascular in the next week or so. Pain not under good control, out of short course of oxycodone. Will refill.

## 2018-03-09 NOTE — Progress Notes (Signed)
   BP 136/83   Pulse 96   Temp 98.7 F (37.1 C) (Oral)   Ht 5\' 10"  (1.778 m)   Wt 181 lb 8 oz (82.3 kg)   SpO2 99%   BMI 26.04 kg/m    Subjective:    Patient ID: Glenn Gerold., male    DOB: 1955/12/24, 62 y.o.   MRN: [id number]  HPI: Glenn Sippel. is a 62 y.o. male  Chief Complaint  Patient presents with  . Pain    Right foot, oxycodone refill   Pt here today for refill on his oxycodone short script for his acutely gangrenous 4th and 5th toes on right foot. Following with podiatry, wound care, and vascular currently. Pain under very poor control. Denies fevers, chills, sweats.   Relevant past medical, surgical, family and social history reviewed and updated as indicated. Interim medical history since our last visit reviewed. Allergies and medications reviewed and updated.  Review of Systems  Per HPI unless specifically indicated above     Objective:    BP 136/83   Pulse 96   Temp 98.7 F (37.1 C) (Oral)   Ht 5\' 10"  (1.778 m)   Wt 181 lb 8 oz (82.3 kg)   SpO2 99%   BMI 26.04 kg/m   Wt Readings from Last 3 Encounters:  03/09/18 181 lb 8 oz (82.3 kg)  02/09/18 184 lb (83.5 kg)  01/25/18 182 lb (82.6 kg)    Physical Exam  Constitutional: He is oriented to person, place, and time. He appears well-developed and well-nourished. No distress.  HENT:  Head: Atraumatic.  Eyes: Pupils are equal, round, and reactive to light. Conjunctivae are normal.  Neck: Normal range of motion. Neck supple.  Cardiovascular: Normal rate and regular rhythm.  Pulmonary/Chest: Effort normal. No respiratory distress.  Musculoskeletal:  Right foot wrapped in soft gauze, pt requests it not be unwrapped  Neurological: He is alert and oriented to person, place, and time.  Skin: Skin is warm and dry.  Nursing note and vitals reviewed.  Results for orders placed or performed during the hospital encounter of 03/03/18  Aerobic Culture (superficial specimen)  Result Value Ref Range    Specimen Description      WOUND Performed at [facility], [address], [place], [place] [address]    Special Requests      NONE Performed at [facility], [place], [place] [address]    Gram Stain NO WBC SEEN NO ORGANISMS SEEN     Culture      NO GROWTH 3 DAYS Performed at [facility], [place] [address] [place], [place] [address]    Report Status 03/06/2018 FINAL       Assessment & Plan:   Problem List Items Addressed This Visit      Other   Necrotic toes ([place]) - Primary    Has seen podiatry, scheduled to see wound care and vascular in the next week or so. Pain not under good control, out of short course of oxycodone. Will refill.        [place] Controlled Substance Database reviewed and appropriate.   Follow up plan: Return for wound care as scheduled.

## 2018-03-09 NOTE — Patient Instructions (Signed)
Follow up as scheduled.  

## 2018-03-10 ENCOUNTER — Encounter: Payer: Medicare HMO | Admitting: Nurse Practitioner

## 2018-03-10 DIAGNOSIS — E11621 Type 2 diabetes mellitus with foot ulcer: Secondary | ICD-10-CM | POA: Diagnosis not present

## 2018-03-10 DIAGNOSIS — S91104A Unspecified open wound of right lesser toe(s) without damage to nail, initial encounter: Secondary | ICD-10-CM | POA: Diagnosis not present

## 2018-03-10 DIAGNOSIS — Z888 Allergy status to other drugs, medicaments and biological substances status: Secondary | ICD-10-CM | POA: Diagnosis not present

## 2018-03-10 DIAGNOSIS — S91101A Unspecified open wound of right great toe without damage to nail, initial encounter: Secondary | ICD-10-CM | POA: Diagnosis not present

## 2018-03-10 DIAGNOSIS — R230 Cyanosis: Secondary | ICD-10-CM | POA: Diagnosis not present

## 2018-03-10 DIAGNOSIS — I96 Gangrene, not elsewhere classified: Secondary | ICD-10-CM | POA: Diagnosis not present

## 2018-03-10 DIAGNOSIS — E1151 Type 2 diabetes mellitus with diabetic peripheral angiopathy without gangrene: Secondary | ICD-10-CM | POA: Diagnosis not present

## 2018-03-10 DIAGNOSIS — I480 Paroxysmal atrial fibrillation: Secondary | ICD-10-CM | POA: Diagnosis not present

## 2018-03-10 DIAGNOSIS — L97519 Non-pressure chronic ulcer of other part of right foot with unspecified severity: Secondary | ICD-10-CM | POA: Diagnosis not present

## 2018-03-13 NOTE — Progress Notes (Signed)
SELWYN, REASON ([id number]) Visit Report for 03/03/2018 Abuse/Suicide Risk Screen Details Patient Name: [name], [name]. Date of Service: 03/03/2018 9:45 AM Medical Record Number: [id number] Patient Account Number: [id number] Date of Birth/Sex: Jan 27, 1956 (62 y.o. Male) Treating RN: Cornell Barman Primary Care Trinity Haun: Park Liter Other Clinician: Referring Suttyn Cryder: Samara Deist Treating Jaquavis Felmlee/Extender: [name] in Treatment: 0 Abuse/Suicide Risk Screen Items Answer ABUSE/SUICIDE RISK SCREEN: Has anyone close to you tried to hurt or harm you recentlyo No Do you feel uncomfortable with anyone in your familyo No Has anyone forced you do things that you didnot want to doo No Do you have any thoughts of harming yourselfo No Patient displays signs or symptoms of abuse and/or neglect. No Electronic Signature(s) Signed: 03/04/2018 6:17:48 PM By: Gretta Cool, BSN, RN, CWS, Kim RN, BSN Entered By: Gretta Cool, BSN, RN, CWS, Kim on 03/03/2018 10:12:59 [name], [name] ([id number]) -------------------------------------------------------------------------------- Activities of Daily Living Details Patient Name: [name], [name] Date of Service: 03/03/2018 9:45 AM Medical Record Number: [id number] Patient Account Number: [id number] Date of Birth/Sex: [birth year] (62 y.o. Male) Treating RN: Cornell Barman Primary Care Adalae Baysinger: Park Liter Other Clinician: Referring La Dibella: Samara Deist Treating Crosby Bevan/Extender: [name] in Treatment: 0 Activities of Daily Living Items Answer Activities of Daily Living (Please select one for each item) Drive Automobile Not Able Take Medications Need Assistance Use Telephone Completely Able Care for Appearance Completely Able Use Toilet Completely Able Bath / Shower Completely Able Dress Self Completely Able Feed Self Completely Able Walk Completely Able Get In / Out Bed Completely [place] Need Assistance Shop for Self Need Assistance Electronic Signature(s) Signed: 03/04/2018 6:17:48 PM By: Gretta Cool, BSN, RN, CWS, Kim RN, BSN Entered By: Gretta Cool, BSN, RN, CWS, Kim on 03/03/2018 10:13:38 Level, [name] ([id number]) -------------------------------------------------------------------------------- Education Assessment Details Patient Name: Glenn Standard B. Date of Service: 03/03/2018 9:45 AM Medical Record Number: [id number] Patient Account Number: [id number] Date of Birth/Sex: [birth year] (62 y.o. Male) Treating RN: Cornell Barman Primary Care Breleigh Carpino: Park Liter Other Clinician: Referring Rawley Harju: Samara Deist Treating Maclain Cohron/Extender: [name] in Treatment: 0 Learning Preferences/Education Level/Primary Language Highest Education Level: College or Above Preferred Language: English Cognitive Barrier Assessment/Beliefs Language Barrier: No Translator Needed: No Memory Deficit: No Emotional Barrier: No Cultural/Religious Beliefs Affecting Medical Care: No Physical Barrier Assessment Impaired Vision: Yes Legally Blind, right eye Impaired Hearing: No Decreased Hand dexterity: No Knowledge/Comprehension Assessment Knowledge Level: Medium Comprehension Level: Medium Ability to understand written Medium instructions: Ability to understand verbal Medium instructions: Motivation Assessment Anxiety Level: Anxious Cooperation: Cooperative Education Importance: Acknowledges Need Interest in Health Problems: Asks Questions Perception: Coherent Willingness to Engage in Self- High Management Activities: Readiness to Engage in Self- High Management Activities: Electronic Signature(s) Signed: 03/04/2018 6:17:48 PM By: Gretta Cool, BSN, RN, CWS, Kim RN, BSN Entered By: Gretta Cool, BSN, RN, CWS, Kim on 03/03/2018 10:14:14 [name], [name] ([id number]) -------------------------------------------------------------------------------- Fall Risk  Assessment Details Patient Name: [name] Date of Service: 03/03/2018 9:45 AM Medical Record Number: [id number] Patient Account Number: [id number] Date of Birth/Sex: 31-Aug-1955 (62 y.o. Male) Treating RN: Cornell Barman Primary Care Channing Yeager: Park Liter Other Clinician: Referring Tumeka Chimenti: Samara Deist Treating [place] Severs/Extender: [name] in Treatment: 0 Fall Risk Assessment Items Have you had 2 or more falls in the last 12 monthso 0 No Have you had any fall that resulted in injury in the last 12 monthso 0 No FALL RISK ASSESSMENT: History of falling - immediate or within 3 months  0 No Secondary diagnosis 0 No Ambulatory aid None/bed rest/wheelchair/nurse 0 Yes Crutches/cane/walker 0 No Furniture 0 No IV Access/Saline Lock 0 No Gait/Training Normal/bed rest/immobile 0 Yes Weak 0 No Impaired 0 No Mental Status Oriented to own ability 0 Yes Electronic Signature(s) Signed: 03/04/2018 6:17:48 PM By: Gretta Cool, BSN, RN, CWS, Kim RN, BSN Entered By: Gretta Cool, BSN, RN, CWS, Kim on 03/03/2018 10:14:26 [name], [name] ([id number]) -------------------------------------------------------------------------------- Nutrition Risk Assessment Details Patient Name: Glenn Standard B. Date of Service: 03/03/2018 9:45 AM Medical Record Number: [id number] Patient Account Number: [id number] Date of Birth/Sex: 1956-03-21 (62 y.o. Male) Treating RN: Cornell Barman Primary Care Leilyn Frayre: Park Liter Other Clinician: Referring Rubert Frediani: Samara Deist Treating Kaitelyn Jamison/Extender: [name] in Treatment: 0 Height (in): 70 Weight (lbs): 185 Body Mass Index (BMI): 26.5 Nutrition Risk Assessment Items NUTRITION RISK SCREEN: I have an illness or condition that made me change the kind and/or amount of 0 No food I eat I eat fewer than two meals per day 0 No I eat few fruits and vegetables, or milk products 0 No I have three or more drinks of beer, liquor or wine almost every day 0  No I have tooth or mouth problems that make it hard for me to eat 0 No I don't always have enough money to buy the food I need 0 No I eat alone most of the time 0 No I take three or more different prescribed or over-the-counter drugs a day 1 Yes Without wanting to, I have lost or gained 10 pounds in the last six months 0 No I am not always physically able to shop, cook and/or feed myself 0 No Nutrition Protocols Good Risk Protocol 0 No interventions needed Moderate Risk Protocol Electronic Signature(s) Signed: 03/04/2018 6:17:48 PM By: Gretta Cool, BSN, RN, CWS, Kim RN, BSN Entered By: Gretta Cool, BSN, RN, CWS, Kim on 03/03/2018 10:14:41

## 2018-03-16 ENCOUNTER — Encounter (INDEPENDENT_AMBULATORY_CARE_PROVIDER_SITE_OTHER): Payer: Self-pay | Admitting: Vascular Surgery

## 2018-03-16 ENCOUNTER — Ambulatory Visit (INDEPENDENT_AMBULATORY_CARE_PROVIDER_SITE_OTHER): Payer: Medicare HMO

## 2018-03-16 ENCOUNTER — Other Ambulatory Visit (INDEPENDENT_AMBULATORY_CARE_PROVIDER_SITE_OTHER): Payer: Self-pay | Admitting: Vascular Surgery

## 2018-03-16 ENCOUNTER — Ambulatory Visit (INDEPENDENT_AMBULATORY_CARE_PROVIDER_SITE_OTHER): Payer: Medicare HMO | Admitting: Nurse Practitioner

## 2018-03-16 VITALS — BP 142/78 | HR 82 | Resp 16 | Ht 70.0 in | Wt 189.6 lb

## 2018-03-16 DIAGNOSIS — M79606 Pain in leg, unspecified: Secondary | ICD-10-CM

## 2018-03-16 DIAGNOSIS — E118 Type 2 diabetes mellitus with unspecified complications: Secondary | ICD-10-CM

## 2018-03-16 DIAGNOSIS — M79604 Pain in right leg: Secondary | ICD-10-CM | POA: Diagnosis not present

## 2018-03-16 DIAGNOSIS — I96 Gangrene, not elsewhere classified: Secondary | ICD-10-CM

## 2018-03-16 DIAGNOSIS — I739 Peripheral vascular disease, unspecified: Secondary | ICD-10-CM | POA: Diagnosis not present

## 2018-03-16 DIAGNOSIS — M79605 Pain in left leg: Secondary | ICD-10-CM

## 2018-03-16 DIAGNOSIS — I480 Paroxysmal atrial fibrillation: Secondary | ICD-10-CM

## 2018-03-16 NOTE — Progress Notes (Signed)
Subjective:    Patient ID: [name], male    DOB: 17-Jun-1956, 62 y.o.   MRN: [id number] Chief Complaint  Patient presents with  . Follow-up    bilateral le swelling,toes painful    HPI  The patient returns to the office for followup and review of the noninvasive studies.  The patient is accompanied by his wife.  The patient has been followed closely by Dr. Vickki Muff in podiatry.  He has recently developed necrosis of his fourth and fifth toe on his right lower extremity.  There is also some slight discoloration of the tips of his first second and third toes as well.  The skin is intact.  The patient is seen to evaluate circulation status as he has needed multiple interventions in the past.  His bilateral legs are also swollen.  There have been no significant changes to the patient's overall health care.  The patient denies amaurosis fugax or recent TIA symptoms. There are no recent neurological changes noted. The patient denies history of DVT, PE or superficial thrombophlebitis. The patient denies recent episodes of angina or shortness of breath.    Patient underwent bilateral ABIs today.   ABI Rt= 0.99 and Lt= 1.11 (previous ABI's Rt= 0.99 and Lt=1.09).  This test shows no significant change from the previous exam done on 01/19/2018.  The patient also underwent a peripheral vascular angiogram on 01/13/2018.     Constitutional: [] Weight loss  [] Fever  [] Chills Cardiac: [] Chest pain   [] Chest pressure   [] Palpitations   [] Shortness of breath when laying flat   [] Shortness of breath with exertion. Vascular:  [] Pain in legs with walking   [] Pain in legs with standing  [] History of DVT   [] Phlebitis   [x] Swelling in legs   [] Varicose veins   [x] Non-healing ulcers Pulmonary:   [] Uses home oxygen   [] Productive cough   [] Hemoptysis   [] Wheeze  [] COPD   [] Asthma Neurologic:  [] Dizziness   [] Seizures   [x] History of stroke   [] History of TIA  [] Aphasia   [] Vissual changes   [] Weakness  or numbness in arm   [] Weakness or numbness in leg Musculoskeletal:   [] Joint swelling   [] Joint pain   [] Low back pain Hematologic:  [] Easy bruising  [] Easy bleeding   [] Hypercoagulable state   [] Anemic Gastrointestinal:  [] Diarrhea   [] Vomiting  [] Gastroesophageal reflux/heartburn   [] Difficulty swallowing. Genitourinary:  [] Chronic kidney disease   [] Difficult urination  [] Frequent urination   [] Blood in urine Skin:  [] Rashes   [x] Ulcers  Psychological:  [] History of anxiety   []  History of major depression.     Objective:   Physical Exam  BP (!) 142/78 (BP Location: Right Arm)   Pulse 82   Resp 16   Ht 5\' 10"  (1.778 m)   Wt 189 lb 9.6 oz (86 kg)   BMI 27.20 kg/m   Past Medical History:  Diagnosis Date  . A-fib ([place])   . Diabetes ([place])   . History of hernia repair   . Hyperlipidemia   . Hypertension   . Legally blind   . Peripheral vascular disease ([place])   . Stroke [facility])      Gen: WD/WN, NAD Head: [place]/AT, No temporalis wasting.  Ear/Nose/Throat: Hearing grossly intact, nares w/o erythema or drainage, poor dentition Eyes: PER, EOMI, sclera nonicteric.  Neck: Supple, no masses.  No bruit or JVD.  Pulmonary:  Good air movement, clear to auscultation bilaterally, no use of accessory muscles.  Cardiac:irregularly irregular,  normal S1, S2, no Murmurs. Vascular:   2+soft pitting edema Vessel Right Left  Radial  palpable  palpable  PT  palpable  palpable  Gastrointestinal: soft, non-distended. No guarding/no peritoneal signs.  Musculoskeletal: M/S 5/5 throughout.  No deformity or atrophy.  Neurologic: CN 2-12 intact. Pain and light touch intact in extremities.  Symmetrical.  Speech is fluent. Motor exam as listed above. 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   Mood & affect appropriate for pt's clinical situation. Dermatologic: no Venous rashes gangrene toes.  No changes consistent with cellulitis. Lymph : No Cervical lymphadenopathy, no  lichenification or skin changes of chronic lymphedema.   Social History   Socioeconomic History  . Marital status: Married    Spouse name: Not on file  . Number of children: 0  . Years of education: Not on file  . Highest education level: Not on file  Occupational History  . Not on file  Social Needs  . Financial resource strain: Not on file  . Food insecurity:    Worry: Not on file    Inability: Not on file  . Transportation needs:    Medical: Not on file    Non-medical: Not on file  Tobacco Use  . Smoking status: Former Smoker    Packs/day: 0.25    Years: 30.00    Pack years: 7.50    Types: Cigarettes  . Smokeless tobacco: Never Used  Substance and Sexual Activity  . Alcohol use: Yes    Alcohol/week: 8.0 standard drinks    Types: 8 Cans of beer per week    Comment: occasionally  . Drug use: No  . Sexual activity: Yes  Lifestyle  . Physical activity:    Days per week: Not on file    Minutes per session: Not on file  . Stress: Not on file  Relationships  . Social connections:    Talks on phone: Not on file    Gets together: Not on file    Attends religious service: Not on file    Active member of club or organization: Not on file    Attends meetings of clubs or organizations: Not on file    Relationship status: Not on file  . Intimate partner violence:    Fear of current or ex partner: Not on file    Emotionally abused: Not on file    Physically abused: Not on file    Forced sexual activity: Not on file  Other Topics Concern  . Not on file  Social History Narrative  . Not on file    Past Surgical History:  Procedure Laterality Date  . APPENDECTOMY    . HERNIA REPAIR     UMBILICAL  . LOWER EXTREMITY ANGIOGRAPHY Right 10/25/2017   Procedure: LOWER EXTREMITY ANGIOGRAPHY;  Surgeon: [name], MD;  Location: [facility];  Service: Cardiovascular;  Laterality: Right;  . LOWER EXTREMITY ANGIOGRAPHY Right 01/13/2018   Procedure: LOWER EXTREMITY  ANGIOGRAPHY;  Surgeon: [name], MD;  Location: [facility];  Service: Cardiovascular;  Laterality: Right;  . LOWER EXTREMITY INTERVENTION  10/25/2017   Procedure: LOWER EXTREMITY INTERVENTION;  Surgeon: [name], MD;  Location: [facility];  Service: Cardiovascular;;  . TONSILLECTOMY      Family History  Problem Relation Age of Onset  . Diabetes Father   . Hypertension Father   . Hyperlipidemia Father     Allergies  Allergen Reactions  . Sodium Pentobarbital [  Pentobarbital] Shortness Of Breath  . Lipitor [Atorvastatin] Rash  . Gabapentin Other (See Comments)    Other reaction(s): Dizziness       Assessment & Plan:   1. PVD (peripheral vascular disease) ([place])  The patient underwent ABI today to examine whether lack of blood flow could be 1 of the causative factors of his right lower extremity wounds.  The ABI findings were consistent with the readings that were done on 01/19/2018, indicating that his ABIs are in the normal range with triphasic waveforms in both the anterior tibial arteries as well as in the posterior tibial arteries. The abnormal flow exhibited in the digits is consistent with known small vessel disease.  Swelling could be a component of reperfusion syndrome, however most recent intervention was on 10/25/2017. I have suggest to patient and wife, using elevation and open toe compression hose to try to contain swelling.  The patient seems to forgetful following two strokes, so this may present a challenge.    We will continue to follow due to history of Common Illiac Artery stents, however no intervention is recommended at this time.   - VAS [place] ABI WITH/WO TBI; Future - VAS [place] LOWER EXTREMITY ARTERIAL DUPLEX; Future  2. Type 2 diabetes mellitus with complication, unspecified whether long term insulin use ([name]) Continue antihypertensive medications as already ordered, these medications have been reviewed and there are no changes at this  time.   3. Necrotic toes ([place]) The patient's necrotic toes are being followed and dressed by his podiatrist and the wound clinic.  The causes are likely those referenced above.    4. Paroxysmal atrial fibrillation ([name]) Continue antiarrhythmia medications as already ordered, these medications have been reviewed and there are no changes at this time.  Also suggest with patient and his wife to follow up with his cardiologist to explore if there is a cardiovascular component to his swelling.  Recent echocardiogram in 06/2017 revealed normal EF, however given significant swelling, ruling out a cardiac cause seems prudent.      Current Outpatient Medications on File Prior to Visit  Medication Sig Dispense Refill  . apixaban (ELIQUIS) 5 MG TABS tablet Take 1 tablet (5 mg total) by mouth 2 (two) times daily. 180 tablet 1  . aspirin 81 MG EC tablet Take 1 tablet (81 mg total) by mouth daily. 90 tablet 4  . B Complex Vitamins (VITAMIN B COMPLEX PO) Take 1 tablet by mouth daily.     . Biotin (BIOTIN 5000) 5 MG CAPS Take 1 capsule by mouth daily.    . cholecalciferol (VITAMIN D) 1000 units tablet Take 1,000 Units by mouth daily.    Marland Kitchen diltiazem (CARDIZEM CD) 240 MG 24 hr capsule TAKE 1 CAPSULE BY MOUTH EVERY DAY 90 capsule 1  . empagliflozin (JARDIANCE) 10 MG TABS tablet Take 10 mg by mouth daily. (Patient taking differently: Take 5 mg daily by mouth. ) 90 tablet 3  . ezetimibe-simvastatin (VYTORIN) 10-40 MG tablet Take 1 tablet by mouth daily at 6 PM. 90 tablet 3  . metFORMIN (GLUCOPHAGE) 1000 MG tablet Take 1 tablet (1,000 mg total) by mouth 2 (two) times daily with a meal. 180 tablet 3  . Multiple Vitamin (MULTIVITAMIN WITH MINERALS) TABS tablet Take 1 tablet by mouth daily. 30 tablet 0  . oxyCODONE (OXYCONTIN) 10 mg 12 hr tablet Take 1 tablet (10 mg total) by mouth every 12 (twelve) hours. 20 tablet 0  . vitamin C (ASCORBIC ACID) 500 MG tablet Take 500 mg  by mouth daily.    . Vitamins/Minerals  TABS Take by mouth.     No current facility-administered medications on file prior to visit.     There are no Patient Instructions on file for this visit. No follow-ups on file.   Kris Hartmann, NP

## 2018-03-16 NOTE — Progress Notes (Signed)
KENNARD, FILDES ([id number]) Visit Report for 03/03/2018 Chief Complaint Document Details Patient Name: STRUMMER, CANIPE. Date of Service: 03/03/2018 9:45 AM Medical Record Number: [id number] Patient Account Number: [id number] Date of Birth/Sex: 1956/07/23 (62 y.o. Male) Treating RN: [name] Primary Care Provider: Park Liter Other Clinician: Referring Provider: Samara Deist Treating Provider/Extender: [name] in Treatment: 0 Information Obtained from: Patient Chief Complaint right toe Electronic Signature(s) Signed: 03/03/2018 1:17:12 PM By: [name] Entered By: [name] on 03/03/2018 13:17:12 Wessner, Louie Boston ([id number]) -------------------------------------------------------------------------------- HPI Details Patient Name: [name] Date of Service: 03/03/2018 9:45 AM Medical Record Number: [id number] Patient Account Number: [id number] Date of Birth/Sex: 04-08-1956 (62 y.o. Male) Treating RN: [name] Primary Care Provider: Park Liter Other Clinician: Referring Provider: Samara Deist Treating Provider/Extender: [name] in Treatment: 0 History of Present Illness HPI Description: 03/03/18- He is here for initial evaluation of right toe wounds and tissue necrosis. He saw Dr. Vickki Muff on 7/23 where he was noted to have progressive ischemic changes to his toes. He was referred from Dr. Fellows office to the wound clinic for possible hyperbaric oxygen therapy. At this time, based on the information we have at hand, there is no diagnosis that would qualify him for hyperbaric oxygen therapy. He underwent an angiogram on 6/13 by Dr. 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 He did not have intervention on 6/13. arterial studies performed on 6/19 showed resting ABI to the 0.99 (right) and 1.09 (left). Electronic Signature(s) Signed: 03/03/2018 5:34:48 PM By: [name] Previous Signature: 03/03/2018 3:46:44 PM Version By: [name] Entered By: [name] on 03/03/2018 17:34:48 Lazcano, Louie Boston ([id number]) -------------------------------------------------------------------------------- Physical Exam Details Patient Name: [name] Date of Service: 03/03/2018 9:45 AM Medical Record Number: [id number] Patient Account Number: [id number] Date of Birth/Sex: [birth year] (62 y.o. Male) Treating RN: [name] Primary Care Provider: Park Liter Other Clinician: Referring Provider: Samara Deist Treating Provider/Extender: [name] Weeks in Treatment: 0 Respiratory respirations are even and unlabored. clear throughout. Cardiovascular s1 s2 regular rate and rhythm. Musculoskeletal transported via wheelchair. 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 Psychiatric does not appear to have appropriate insight and judgement to medical care. forgetful d/t stroke; repeats self. Electronic Signature(s) Signed: 03/03/2018 9:29:30 PM By: [name] Previous Signature: 03/03/2018 9:26:57 PM Version By: [name] Entered By: [name] on 03/03/2018 21:29:29 Signore, Louie Boston ([id number]) -------------------------------------------------------------------------------- Physician Orders Details Patient Name: [name] Date of Service: 03/03/2018 9:45 AM Medical Record Number: [id number] Patient Account  Number: [id number] Date of Birth/Sex: [birth year] (62 y.o. Male) Treating RN: [name] Primary Care Provider: Park Liter Other Clinician: Referring Provider: Samara Deist Treating Provider/Extender: [name] in Treatment: 0 Verbal / Phone Orders: Yes Clinician: Carolyne Fiscal, [name] Read Back and Verified: Yes Diagnosis Coding Wound Cleansing Wound #1 Right,Circumferential Toe Fifth o Clean wound with Normal Saline. Wound #2 Right Toe Fourth o Clean wound with Normal Saline. Wound #3 Right Toe Great o Clean wound with Normal Saline. Wound #3  Right Toe Great o Clean wound with Normal Saline. Primary Wound Dressing Wound #3 Right Toe Great o Medihoney gel Secondary Dressing Wound #3 Right Toe Great o Dry Gauze o Conform/Kerlix Wound #1 Right,Circumferential Toe Fifth o Other - paint with betadine Wound #2 Right Toe Fourth o Other - paint with betadine Dressing Change Frequency Wound #1 Right,Circumferential Toe Fifth o Change dressing every day. Wound #2 Right Toe Fourth o Change dressing every day. Wound #3 Right Toe Great o Change dressing every day. Follow-up Appointments Wound #1 Right,Circumferential Toe Fifth o Return Appointment in 1 week. Wound #2 Right Toe Fourth o Return Appointment in 1 week. CONNAR, KEATING ([id number]) Wound #3 Right Toe Great o Return Appointment in 1 week. Wound #3 Right Toe Great o Return Appointment in 1 week. Edema Control Wound #1 Right,Circumferential Toe Fifth o Elevate legs to the level of the heart and pump ankles as often as possible Wound #2 Right Toe Fourth o Elevate legs to the level of the heart and pump ankles as often as possible Wound #3 Right Toe Great o Elevate legs to the level of the heart and pump ankles as often as possible Wound #3 Right Toe Great o Elevate legs to the level of the heart and pump ankles as often as possible Additional Orders /  Instructions Wound #1 Right,Circumferential Toe Fifth o Increase protein intake. Wound #2 Right Toe Fourth o Increase protein intake. Wound #3 Right Toe Great o Increase protein intake. Wound #3 Right Toe Great o Increase protein intake. Laboratory o Bacteria identified in Wound by Culture (MICRO) - right great toe oooo LOINC Code: 4580-9 oooo Convenience Name: Wound culture routine Radiology o X-ray, foot - right Electronic Signature(s) Signed: 03/03/2018 9:54:05 PM By: [name] Signed: 03/07/2018 4:59:33 PM By: Alric Quan Entered By: Alric Quan on 03/03/2018 10:49:05 Seufert, Louie Boston ([id number]) -------------------------------------------------------------------------------- Problem List Details Patient Name: [name] Date of Service: 03/03/2018 9:45 AM Medical Record Number: [id number] Patient Account Number: [id number] Date of Birth/Sex: 1955/10/31 (62 y.o. Male) Treating RN: [name] Primary Care Provider: Park Liter Other Clinician: Referring Provider: Samara Deist Treating Provider/Extender: [name] in Treatment: 0 Active Problems ICD-10 Evaluated Encounter Code Description Active Date Today Diagnosis E11.621 Type 2 diabetes mellitus with foot ulcer 03/03/2018 No Yes S91.101S Unspecified open wound of right great toe without damage to 03/03/2018 No Yes nail, sequela R23.0 Cyanosis 03/03/2018 No Yes I73.9 Peripheral vascular disease, unspecified 03/03/2018 No Yes Inactive Problems Resolved Problems Electronic Signature(s) Signed: 03/03/2018 1:16:40 PM By: [name] Previous Signature: 03/03/2018 1:13:08 PM Version By: [name] Entered By: [name] on 03/03/2018 13:16:40 Capaldi, Louie Boston ([id number]) -------------------------------------------------------------------------------- Progress Note Details Patient Name: [name] Date of Service: 03/03/2018 9:45 AM Medical Record Number:  [id number] Patient Account Number: [id number] Date of Birth/Sex: Apr 07, 1956 (62 y.o. Male) Treating RN: [name] Primary Care Provider: Park Liter Other Clinician: Referring Provider: Samara Deist Treating Provider/Extender: [name] in Treatment: 0 Subjective Chief Complaint Information obtained from Patient right toe History of Present Illness (HPI) 03/03/18- He is here for initial evaluation of right toe wounds and tissue necrosis. He saw Dr. Vickki Muff on 7/23 where he was noted to have progressive ischemic changes to his toes. He was referred from Dr. Fellows office to the wound clinic for possible hyperbaric oxygen therapy. At this time, based on the information we have at hand, there is no diagnosis that would qualify him for hyperbaric oxygen therapy. He underwent an angiogram  on 6/13 by Dr. 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 He did not have intervention on 6/13. arterial studies performed on 6/19 showed resting ABI to the 0.99 (right) and 1.09 (left). Wound History Patient presents with 2 open wounds that have been present for approximately 2 month. Patient has been treating wounds in the following manner: neosporin or iodine. Laboratory tests have been performed in the last month. Patient reportedly has not tested positive for an antibiotic resistant organism. Patient reportedly has not tested positive for osteomyelitis. Patient reportedly has had testing performed to evaluate circulation in the legs. Patient History Information  obtained from Patient. Allergies atorvastatin (Reaction: rash), gabapentin (Reaction: dizziness and vomiting) Family History No family history of Cancer, Diabetes, Heart Disease, Hypertension, Kidney Disease, Lung Disease, Seizures, Stroke, Thyroid Problems, Tuberculosis. Social History Former smoker - ended on 01/31/2018, Marital Status - Married, Alcohol Use - Moderate, Drug Use - No History, Caffeine Use - Daily. Medical History Eyes Denies history of Cataracts, Glaucoma, Optic Neuritis Ear/Nose/Mouth/Throat Denies history of Chronic sinus problems/congestion, Middle ear problems Hematologic/Lymphatic Denies history of Anemia, Hemophilia, Human Immunodeficiency Virus, Lymphedema, Sickle Cell Disease Respiratory Mondry, Izick B. ([id number]) Denies history of Aspiration, Asthma, Chronic Obstructive Pulmonary Disease (COPD), Pneumothorax, Sleep Apnea, Tuberculosis Cardiovascular Patient has history of Arrhythmia - a-fib, Hypertension, Peripheral Arterial Disease Denies history of Angina, Congestive Heart Failure, Coronary Artery Disease, Deep Vein Thrombosis, Hypotension, Myocardial Infarction, Peripheral Venous Disease, Phlebitis, Vasculitis Gastrointestinal Denies history of Cirrhosis , Colitis, Crohn s, Hepatitis A, Hepatitis B, Hepatitis C Endocrine Patient has history of Type II Diabetes Denies history of Type I Diabetes Genitourinary Denies history of End Stage Renal Disease Immunological Denies history of Lupus Erythematosus, Raynaud s, Scleroderma Integumentary (Skin) Denies history of History of Burn, History of pressure wounds Musculoskeletal Denies history of Gout, Rheumatoid Arthritis, Osteoarthritis, Osteomyelitis Neurologic Patient has history of Dementia - stroke Denies history of Neuropathy, Quadriplegia, Paraplegia, Seizure Disorder Oncologic Denies history of Received Chemotherapy, Received Radiation Psychiatric Denies history of Anorexia/bulimia,  Confinement Anxiety Patient is treated with Oral Agents. Blood sugar is not tested. Hospitalization/Surgery History - 06/03/2017, [facility], Stroke. Review of Systems (ROS) Constitutional Symptoms (General Health) The patient has no complaints or symptoms. 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 Ear/Nose/Mouth/Throat The patient has no complaints or symptoms. Hematologic/Lymphatic The patient has no complaints or symptoms. Respiratory The patient has no complaints or symptoms. Cardiovascular The patient has no complaints or symptoms. Gastrointestinal The patient has no complaints or symptoms. Endocrine Denies complaints or symptoms of Hepatitis, Thyroid disease, Polydypsia (Excessive Thirst). Genitourinary The patient has no complaints or symptoms. Immunological The patient has no complaints or symptoms. Integumentary (Skin) Complains or has symptoms of Wounds, Bleeding or bruising tendency. Denies complaints or symptoms of Breakdown, Swelling. Musculoskeletal The patient has no complaints or symptoms. Neurologic The patient has no complaints or symptoms. Oncologic ABDIRAHMAN, CHITTUM ([id number]) The patient has no complaints or symptoms. Psychiatric The patient has no complaints or symptoms. Objective Constitutional Vitals Time Taken: 9:59 AM, Height: 70 in, Weight: 185 lbs, BMI: 26.5, Temperature: 98.2 F, Pulse: 79 bpm, Respiratory Rate: 16 breaths/min,  Blood Pressure: 145/74 mmHg. Respiratory respirations are even and unlabored. clear throughout. Cardiovascular s1 s2 regular rate and rhythm. Musculoskeletal transported via wheelchair. Psychiatric does not appear to have appropriate insight and judgement to medical care. forgetful d/t stroke; repeats self. 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 Wound #1 status is Open. Original cause of wound was Gradually Appeared. The wound is located on the Right,Circumferential Toe Fifth. The wound measures 2cm length x 2.2cm width x 0.1cm depth; 3.456cm^2 area and 0.346cm^3 volume. There is no tunneling or undermining noted. There is a none present amount of drainage noted. The wound margin is indistinct and nonvisible. There is no granulation within the wound bed. There is a large (67-100%) amount of necrotic tissue within the wound bed including Eschar. The periwound skin appearance exhibited: Ecchymosis. Wound #2 status is Open. Original cause of wound was Gradually Appeared. The wound is located on the Right Toe Fourth. The wound measures 3cm length x 5cm width x 0.1cm depth; 11.781cm^2 area and 1.178cm^3 volume. There is a none present amount of drainage noted. The wound margin is indistinct and nonvisible. There is no granulation within the wound bed. There is a large (67-100%) amount of necrotic tissue within the wound bed including Eschar. The periwound skin appearance exhibited: Ecchymosis. The periwound skin appearance did not exhibit: Callus, Crepitus, Excoriation, Induration, Rash, Scarring, Dry/Scaly, Maceration, Atrophie Blanche, Cyanosis, Hemosiderin Staining, Mottled, Pallor, Rubor, Erythema. Wound #3 status is Open. Original cause of wound was Gradually Appeared. The wound is located on the Right Toe Great. The wound measures 0.6cm length x 0.6cm width x 0.1cm depth; 0.283cm^2 area and 0.028cm^3 volume. The wound is limited to skin breakdown. There is no tunneling or undermining noted. There is a medium amount of serous drainage noted. The wound margin is flat and intact. There is medium (34-66%) red granulation within the wound bed. There is no necrotic tissue within the wound bed. The periwound skin appearance had no abnormalities noted for texture. The periwound skin appearance had no abnormalities noted  for moisture. The periwound skin appearance had no abnormalities noted for color. ANTONIO, CRESWELL ([id number]) Assessment Active Problems ICD-10 Type 2 diabetes mellitus with foot ulcer Unspecified open wound of right great toe without damage to nail, sequela Cyanosis Peripheral vascular disease, unspecified Plan Wound Cleansing: Wound #1 Right,Circumferential Toe Fifth: Clean wound with Normal Saline. Wound #2 Right Toe Fourth: Clean wound with Normal Saline. Wound #3 Right Toe Great: Clean wound with Normal Saline. Wound #3 Right Toe Great: Clean wound with Normal Saline. Primary Wound Dressing: Wound #3 Right Toe Great: Medihoney gel Secondary Dressing: Wound #3 Right Toe Great: Dry Gauze Conform/Kerlix Wound #1 Right,Circumferential Toe Fifth: Other - paint with betadine Wound #2 Right Toe Fourth: Other - paint with betadine Dressing Change Frequency: Wound #1 Right,Circumferential Toe Fifth: Change dressing every day. Wound #2 Right Toe Fourth: Change dressing every day. Wound #3 Right Toe Great: Change dressing every day. Follow-up Appointments: Wound #1 Right,Circumferential Toe Fifth: Return Appointment in 1 week. Wound #2 Right Toe Fourth: Return Appointment in 1 week. Wound #3 Right Toe Great: Return Appointment in 1 week. Wound #3 Right Toe Great: Return Appointment in 1 week. Edema Control: Wound #1 Right,Circumferential Toe Fifth: Elevate legs to the level of the heart and pump ankles as often as possible Wound #2 Right Toe Fourth: Elevate legs  to the level of the heart and pump ankles as often as possible Beals, Gerardo B. ([id number]) Wound #3 Right Toe Great: Elevate legs to the level of the heart and pump ankles as often as possible Wound #3 Right Toe Great: Elevate legs to the level of the heart and pump ankles as often as possible Additional Orders / Instructions: Wound #1 Right,Circumferential Toe Fifth: Increase protein intake. Wound #2  Right Toe Fourth: Increase protein intake. Wound #3 Right Toe Great: Increase protein intake. Wound #3 Right Toe Great: Increase protein intake. Laboratory ordered were: Wound culture routine - right great toe Radiology ordered were: X-ray, foot - right Electronic Signature(s) Signed: 03/03/2018 9:30:10 PM By: [name] Previous Signature: 03/03/2018 9:27:39 PM Version By: [name] Previous Signature: 03/03/2018 3:47:30 PM Version By: [name] Entered By: [name] on 03/03/2018 21:30:10 Buskey, Louie Boston ([id number]) -------------------------------------------------------------------------------- ROS/PFSH Details Patient Name: [name] Date of Service: 03/03/2018 9:45 AM Medical Record Number: [id number] Patient Account Number: [id number] Date of Birth/Sex: 10-11-1955 (62 y.o. Male) Treating RN: Cornell Barman Primary Care Provider: Park Liter Other Clinician: Referring Provider: Samara Deist Treating Provider/Extender: [name] in Treatment: 0 Information Obtained From Patient Wound History Do you currently have one or more open woundso Yes How many open wounds do you currently haveo 2 Approximately how long have you had your woundso 2 month How have you been treating your wound(s) until nowo neosporin or iodine Has your wound(s) ever healed and then re-openedo No Have you had any lab work done in the past montho Yes Who ordered the lab work doneo Dr. Wynetta Emery Have you tested positive for an antibiotic resistant organism (MRSA, VRE)o No Have you tested positive for osteomyelitis (bone infection)o No Have you had any tests for circulation on your legso Yes Where was the test doneo Dr. 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 Medical History: Negative for: Cataracts; Glaucoma; Optic Neuritis Endocrine Complaints and Symptoms: Negative for: Hepatitis; Thyroid disease; Polydypsia (Excessive  Thirst) Medical History: Positive for: Type II Diabetes Negative for: Type I Diabetes Time with diabetes: 5 years Treated with: Oral agents Blood sugar tested every day: No Integumentary (Skin) Complaints and Symptoms: Positive for: Wounds; Bleeding or bruising tendency Negative for: Breakdown; Swelling Medical History: Negative for: History of Burn; History of pressure wounds Constitutional Symptoms (General Health) JACCOB, CZAPLICKI B. ([id number]) Complaints and Symptoms: No Complaints or Symptoms Ear/Nose/Mouth/Throat Complaints and Symptoms: No Complaints or Symptoms Medical History: Negative for: Chronic sinus problems/congestion; Middle ear problems Hematologic/Lymphatic Complaints and Symptoms: No Complaints or Symptoms Medical History: Negative for: Anemia; Hemophilia; Human Immunodeficiency Virus; Lymphedema; Sickle Cell Disease Respiratory Complaints and Symptoms: No Complaints or Symptoms Medical History: Negative for: Aspiration; Asthma; Chronic Obstructive Pulmonary Disease (COPD); Pneumothorax; Sleep Apnea; Tuberculosis Cardiovascular Complaints and Symptoms: No Complaints or Symptoms Medical History: Positive for: Arrhythmia - a-fib; Hypertension; Peripheral Arterial Disease Negative for: Angina; Congestive Heart Failure; Coronary Artery Disease; Deep Vein Thrombosis; Hypotension; Myocardial Infarction; Peripheral Venous Disease; Phlebitis; Vasculitis Gastrointestinal Complaints and Symptoms: No Complaints or Symptoms Medical History: Negative for: Cirrhosis ; Colitis; Crohnos; Hepatitis A; Hepatitis B; Hepatitis C Genitourinary Complaints and Symptoms: No Complaints or Symptoms Medical History: Negative for: End Stage Renal Disease Immunological Complaints and Symptoms: No Complaints or Symptoms Medical History: CHIGOZIE, BASALDUA B. ([id number]) Negative for: Lupus Erythematosus; Raynaudos; Scleroderma Musculoskeletal Complaints and Symptoms: No  Complaints or Symptoms Medical History: Negative for: Gout; Rheumatoid Arthritis; Osteoarthritis; Osteomyelitis Neurologic Complaints and  Symptoms: No Complaints or Symptoms Medical History: Positive for: Dementia - stroke Negative for: Neuropathy; Quadriplegia; Paraplegia; Seizure Disorder Oncologic Complaints and Symptoms: No Complaints or Symptoms Medical History: Negative for: Received Chemotherapy; Received Radiation Psychiatric Complaints and Symptoms: No Complaints or Symptoms Medical History: Negative for: Anorexia/bulimia; Confinement Anxiety Immunizations Pneumococcal Vaccine: Received Pneumococcal Vaccination: Yes Implantable Devices Hospitalization / Surgery History Name of Hospital Purpose of Hospitalization/Surgery Date [place] Stroke 06/03/2017 Family and Social History Cancer: No; Diabetes: No; Heart Disease: No; Hypertension: No; Kidney Disease: No; Lung Disease: No; Seizures: No; Stroke: No; Thyroid Problems: No; Tuberculosis: No; Former smoker - ended on 01/31/2018; Marital Status - Married; Alcohol Use: Moderate; Drug Use: No History; Caffeine Use: Daily; Advanced Directives: No; Patient does not want information on Advanced Directives; Living Will: No; Medical Power of Attorney: No Electronic Signature(s) Signed: 03/03/2018 9:54:05 PM By: [name] Signed: 03/04/2018 6:17:48 PM By: Gretta Cool, BSN, RN, CWS, Kim RN, BSN Entered By: Gretta Cool, BSN, RN, CWS, Kim on 03/03/2018 10:12:45 Tiu, Louie Boston ([id number]) -------------------------------------------------------------------------------- SuperBill Details Patient Name: [name] Date of Service: 03/03/2018 Medical Record Number: [id number] Patient Account Number: [id number] Date of Birth/Sex: Mar 14, 1956 (62 y.o. Male) Treating RN: [name] Primary Care Provider: Park Liter Other Clinician: Referring Provider: Samara Deist Treating Provider/Extender: [name] in Treatment: 0 Diagnosis  Coding ICD-10 Codes Code Description E11.621 Type 2 diabetes mellitus with foot ulcer S91.101S Unspecified open wound of right great toe without damage to nail, sequela R23.0 Cyanosis I73.9 Peripheral vascular disease, unspecified Facility Procedures CPT4 Code: [id number] Description: [phone] - WOUND CARE VISIT-LEV 5 EST PT Modifier: Quantity: 1 Physician Procedures CPT4 Code: [id number] Description: WC PHYS LEVEL 3 o NEW PT ICD-10 Diagnosis Description S91.101S Unspecified open wound of right great toe without damage to E11.621 Type 2 diabetes mellitus with foot ulcer R23.0 Cyanosis I73.9 Peripheral vascular disease, unspecified Modifier: nail, sequela Quantity: 1 Electronic Signature(s) Signed: 03/03/2018 9:28:20 PM By: [name] Entered By: [name] on 03/03/2018 21:28:19

## 2018-03-16 NOTE — Progress Notes (Signed)
DAETON, KLUTH ([id number]) Visit Report for 03/03/2018 Allergy List Details Patient Name: [name], [name]. Date of Service: 03/03/2018 9:45 AM Medical Record Number: [id number] Patient Account Number: [id number] Date of Birth/Sex: Jul 06, 1956 (62 y.o. Male) Treating RN: Cornell Barman Primary Care Eriyah Fernando: Park Liter Other Clinician: Referring Keiri Solano: Samara Deist Treating Coco Sharpnack/Extender: [name] in Treatment: 0 Allergies Active Allergies atorvastatin Reaction: rash gabapentin Reaction: dizziness and vomiting Allergy Notes Electronic Signature(s) Signed: 03/04/2018 6:17:48 PM By: Gretta Cool, BSN, RN, CWS, Kim RN, BSN Entered By: Gretta Cool, BSN, RN, CWS, Kim on 03/03/2018 10:01:24 [name] ([id number]) -------------------------------------------------------------------------------- Arrival Information Details Patient Name: [name] Date of Service: 03/03/2018 9:45 AM Medical Record Number: [id number] Patient Account Number: [id number] Date of Birth/Sex: 1956-01-23 (61 y.o. Male) Treating RN: Cornell Barman Primary Care Noelia Lenart: Park Liter Other Clinician: Referring Yan Pankratz: Samara Deist Treating Raquell Richer/Extender: [name] in Treatment: 0 Visit Information Patient Arrived: Wheel Chair Arrival Time: 09:52 Accompanied By: wife, Hilda Blades Transfer Assistance: Manual Patient Identification Verified: Yes Secondary Verification Process Yes Completed: Patient Has Alerts: Yes Patient Alerts: Patient on Blood Thinner Eliquis and 81mg  Aspirin Type II Diabetes Electronic Signature(s) Signed: 03/04/2018 6:17:48 PM By: Gretta Cool, BSN, RN, CWS, Kim RN, BSN Entered By: Gretta Cool, BSN, RN, CWS, Kim on 03/03/2018 09:57:53 Schlatter, Louie Boston ([id number]) -------------------------------------------------------------------------------- Clinic Level of Care Assessment Details Patient Name: [name], [name] Date of Service: 03/03/2018 9:45 AM Medical Record Number:  [id number] Patient Account Number: [id number] Date of Birth/Sex: [birth year] (62 y.o. Male) Treating RN: [name] Primary Care Shakila Mak: Park Liter Other Clinician: Referring Ethylene Reznick: Samara Deist Treating Lacharles Altschuler/Extender: [name] in Treatment: 0 Clinic Level of Care Assessment Items TOOL 2 Quantity Score X - Use when only an EandM is performed on the INITIAL visit 1 0 ASSESSMENTS - Nursing Assessment / Reassessment X - General Physical Exam (combine w/ comprehensive assessment (listed just below) when 1 20 performed on new pt. evals) X- 1 25 Comprehensive Assessment (HX, ROS, Risk Assessments, Wounds Hx, etc.) ASSESSMENTS - Wound and Skin Assessment / Reassessment []  - Simple Wound Assessment / Reassessment - one wound 0 X- 3 5 Complex Wound Assessment / Reassessment - multiple wounds []  - 0 Dermatologic / Skin Assessment (not related to wound area) ASSESSMENTS - Ostomy and/or Continence Assessment and Care []  - Incontinence Assessment and Management 0 []  - 0 Ostomy Care Assessment and Management (repouching, etc.) PROCESS - Coordination of Care X - Simple Patient / Family Education for ongoing care 1 15 []  - 0 Complex (extensive) Patient / Family Education for ongoing care []  - 0 Staff obtains Programmer, systems, Records, Test Results / Process Orders []  - 0 Staff telephones HHA, Nursing Homes / Clarify orders / etc []  - 0 Routine Transfer to another Facility (non-emergent condition) []  - 0 Routine Hospital Admission (non-emergent condition) X- 1 15 New Admissions / Biomedical engineer / Ordering NPWT, Apligraf, etc. []  - 0 Emergency Hospital Admission (emergent condition) X- 1 10 Simple Discharge Coordination []  - 0 Complex (extensive) Discharge Coordination PROCESS - Special Needs []  - Pediatric / Minor Patient Management 0 []  - 0 Isolation Patient Management Dingledine, Kearney B. ([id number]) []  - 0 Hearing / Language / Visual special needs []   - 0 Assessment of Community assistance (transportation, D/C planning, etc.) []  - 0 Additional assistance / Altered mentation []  - 0 Support Surface(s) Assessment (bed, cushion, seat, etc.) INTERVENTIONS - Wound Cleansing / Measurement X - Wound Imaging (photographs - any number of wounds) 1 5 []  - 0  Wound Tracing (instead of photographs) []  - 0 Simple Wound Measurement - one wound X- 3 5 Complex Wound Measurement - multiple wounds []  - 0 Simple Wound Cleansing - one wound X- 3 5 Complex Wound Cleansing - multiple wounds INTERVENTIONS - Wound Dressings X - Small Wound Dressing one or multiple wounds 3 10 []  - 0 Medium Wound Dressing one or multiple wounds []  - 0 Large Wound Dressing one or multiple wounds []  - 0 Application of Medications - injection INTERVENTIONS - Miscellaneous []  - External ear exam 0 []  - 0 Specimen Collection (cultures, biopsies, blood, body fluids, etc.) []  - 0 Specimen(s) / Culture(s) sent or taken to Lab for analysis []  - 0 Patient Transfer (multiple staff / Civil Service fast streamer / Similar devices) []  - 0 Simple Staple / Suture removal (25 or less) []  - 0 Complex Staple / Suture removal (26 or more) []  - 0 Hypo / Hyperglycemic Management (close monitor of Blood Glucose) []  - 0 Ankle / Brachial Index (ABI) - do not check if billed separately Has the patient been seen at the hospital within the last three years: Yes Total Score: 165 Level Of Care: New/Established - Level 5 Electronic Signature(s) Signed: 03/07/2018 4:59:33 PM By: Alric Quan Entered By: Alric Quan on 03/03/2018 13:00:23 Tuohy, Louie Boston ([id number]) -------------------------------------------------------------------------------- Encounter Discharge Information Details Patient Name: Glenn Standard B. Date of Service: 03/03/2018 9:45 AM Medical Record Number: [id number] Patient Account Number: [id number] Date of Birth/Sex: 03-Oct-1955 (62 y.o. Male) Treating RN: Roger Shelter Primary Care Shayanne Gomm: Park Liter Other Clinician: Referring Dream Harman: Samara Deist Treating Nakeitha Milligan/Extender: [name] in Treatment: 0 Encounter Discharge Information Items Discharge Condition: Stable Ambulatory Status: Wheelchair Discharge Destination: Home Transportation: Private Auto Schedule Follow-up Appointment: Yes Clinical Summary of Care: Electronic Signature(s) Signed: 03/04/2018 4:54:12 PM By: Roger Shelter Entered By: Roger Shelter on 03/03/2018 11:13:46 Pouncey, Louie Boston ([id number]) -------------------------------------------------------------------------------- Lower Extremity Assessment Details Patient Name: Glenn Standard B. Date of Service: 03/03/2018 9:45 AM Medical Record Number: [id number] Patient Account Number: [id number] Date of Birth/Sex: [birth year] (62 y.o. Male) Treating RN: Cornell Barman Primary Care Destyne Goodreau: Park Liter Other Clinician: Referring Ellwyn Ergle: Samara Deist Treating Ardis Fullwood/Extender: [name] in Treatment: 0 Vascular Assessment Pulses: Dorsalis Pedis Palpable: [Right:No] Posterior Tibial Extremity colors, hair growth, and conditions: Extremity Color: [Right:Hyperpigmented] Hair Growth on Extremity: [Right:No] Temperature of Extremity: [Right:Cool] Capillary Refill: [Right:> 3 seconds] Toe Nail Assessment Left: Right: Discolored: Yes Deformed: Yes Improper Length and Hygiene: Yes Electronic Signature(s) Signed: 03/03/2018 10:37:58 AM By: Gretta Cool, BSN, RN, CWS, Kim RN, BSN Entered By: Gretta Cool, BSN, RN, CWS, Kim on 03/03/2018 10:37:57 Ardoin, Louie Boston ([id number]) -------------------------------------------------------------------------------- Multi Wound Chart Details Patient Name: Glenn Standard B. Date of Service: 03/03/2018 9:45 AM Medical Record Number: [id number] Patient Account Number: [id number] Date of Birth/Sex: [birth year] (62 y.o. Male) Treating RN: [name] Primary Care  Thomas Mabry: Park Liter Other Clinician: Referring Hadia Minier: Samara Deist Treating Ty Buntrock/Extender: [name] in Treatment: 0 Vital Signs Height(in): 70 Pulse(bpm): 21 Weight(lbs): 185 Blood Pressure(mmHg): 145/74 Body Mass Index(BMI): 27 Temperature(F): 98.2 Respiratory Rate 16 (breaths/min): Photos: [1:No Photos] [2:No Photos] [3:No Photos] Wound Location: [1:Right Toe Fifth - Circumfernential] [2:Right Toe Fourth] [3:Right Toe Great] Wounding Event: [1:Gradually Appeared] [2:Gradually Appeared] [3:Gradually Appeared] Primary Etiology: [1:Arterial Insufficiency Ulcer] [2:Arterial Insufficiency Ulcer] [3:Arterial Insufficiency Ulcer] Comorbid History: [1:Arrhythmia, Hypertension, Peripheral Arterial Disease, Type II Diabetes, Dementia] [2:Arrhythmia, Hypertension, Peripheral Arterial Disease, Type II Diabetes, Dementia] [3:Arrhythmia, Hypertension, Peripheral Arterial Disease, Type  II Diabetes, Dementia] Date Acquired: [1:01/01/2018] [2:01/01/2018] [3:01/01/2018]  Weeks of Treatment: [1:0] [2:0] [3:0] Wound Status: [1:Open] [2:Open] [3:Open] Pending Amputation on [1:Yes] [2:Yes] [3:Yes] Presentation: Measurements L x W x D [1:2x2.2x0.1] [2:3x5x0.1] [3:0.6x0.6x0.1] (cm) Area (cm) : [1:3.456] [2:11.781] [3:0.283] Volume (cm) : [1:0.346] [2:1.178] [3:0.028] Classification: [1:Unclassifiable] [2:Unclassifiable] [3:Partial Thickness] Exudate Amount: [1:None Present] [2:None Present] [3:Medium] Exudate Type: [1:N/A] [2:N/A] [3:Serous] Exudate Color: [1:N/A] [2:N/A] [3:amber] Wound Margin: [1:Indistinct, nonvisible] [2:Indistinct, nonvisible] [3:Flat and Intact] Granulation Amount: [1:None Present (0%)] [2:None Present (0%)] [3:Medium (34-66%)] Granulation Quality: [1:N/A] [2:N/A] [3:Red] Necrotic Amount: [1:Large (67-100%)] [2:Large (67-100%)] [3:None Present (0%)] Necrotic Tissue: [1:Eschar] [2:Eschar] [3:N/A] Exposed Structures: [1:Fascia: No Fat Layer (Subcutaneous  Tissue) Exposed: No Tendon: No Muscle: No Joint: No Bone: No] [2:Fascia: No Fat Layer (Subcutaneous Tissue) Exposed: No Tendon: No Muscle: No Joint: No Bone: No] [3:Fascia: No Fat Layer (Subcutaneous Tissue) Exposed:  No Tendon: No Muscle: No Joint: No Bone: No Limited to Skin Breakdown] Epithelialization: [1:None] [2:None] [3:None] Periwound Skin Texture: [1:No Abnormalities Noted] [2:Excoriation: No Induration: No] [3:Excoriation: No Induration: No] Callus: No Callus: No Crepitus: No Crepitus: No Rash: No Rash: No Scarring: No Scarring: No Periwound Skin Moisture: No Abnormalities Noted Maceration: No Maceration: No Dry/Scaly: No Dry/Scaly: No Periwound Skin Color: Ecchymosis: Yes Ecchymosis: Yes Atrophie Blanche: No Atrophie Blanche: No Cyanosis: No Cyanosis: No Ecchymosis: No Erythema: No Erythema: No Hemosiderin Staining: No Hemosiderin Staining: No Mottled: No Mottled: No Pallor: No Pallor: No Rubor: No Rubor: No Tenderness on Palpation: No No No Wound Preparation: Ulcer Cleansing: Not Cleansed Ulcer Cleansing: Not Cleansed Ulcer Cleansing: Rinsed/Irrigated with Saline Topical Anesthetic Applied: Topical Anesthetic Applied: None None Topical Anesthetic Applied: None Treatment Notes Wound #1 (Right, Circumferential Toe Fifth) 1. Cleansed with: Clean wound with Normal Saline 2. Anesthetic Topical Lidocaine 4% cream to wound bed prior to debridement 3. Peri-wound Care: Other peri-wound care (specify in notes) Notes betadine Wound #2 (Right Toe Fourth) 1. Cleansed with: Clean wound with Normal Saline 2. Anesthetic Topical Lidocaine 4% cream to wound bed prior to debridement 3. Peri-wound Care: Other peri-wound care (specify in notes) Notes betadine Electronic Signature(s) Signed: 03/03/2018 1:16:47 PM By: [name] Entered By: [name] on 03/03/2018 13:16:47 Chong, Louie Boston  ([id number]) -------------------------------------------------------------------------------- [place] Details Patient Name: Glenn Standard B. Date of Service: 03/03/2018 9:45 AM Medical Record Number: [id number] Patient Account Number: [id number] Date of Birth/Sex: 07/09/1956 (62 y.o. Male) Treating RN: [name] Primary Care Levan Aloia: Park Liter Other Clinician: Referring Ebbie Sorenson: Samara Deist Treating Kush Farabee/Extender: [name] in Treatment: 0 Active Inactive ` Abuse / Safety / Falls / Self Care Management Nursing Diagnoses: Potential for falls Goals: Patient will not experience any injury related to falls Date Initiated: 03/03/2018 Target Resolution Date: 07/09/2018 Goal Status: Active Interventions: Assess Activities of Daily Living upon admission and as needed Assess fall risk on admission and as needed Assess: immobility, friction, shearing, incontinence upon admission and as needed Assess impairment of mobility on admission and as needed per policy Assess personal safety and home safety (as indicated) on admission and as needed Assess self care needs on admission and as needed Notes: ` Nutrition Nursing Diagnoses: Imbalanced nutrition Impaired glucose control: actual or potential Potential for alteratiion in Nutrition/Potential for imbalanced nutrition Goals: Patient/caregiver agrees to and verbalizes understanding of need to use nutritional supplements and/or vitamins as prescribed Date Initiated: 03/03/2018 Target Resolution Date: 07/09/2018 Goal Status: Active Patient/caregiver will maintain therapeutic glucose control Date Initiated: 03/03/2018 Target Resolution Date: 06/11/2018 Goal Status: Active Interventions: Assess patient nutrition upon admission and as needed  per policy Provide education on elevated blood sugars and impact on wound healing Provide education on nutrition MAXI, RODAS  ([id number]) Notes: ` Orientation to the Wound Care Program Nursing Diagnoses: Knowledge deficit related to the wound healing center program Goals: Patient/caregiver will verbalize understanding of the [facility] Date Initiated: 03/03/2018 Target Resolution Date: 04/09/2018 Goal Status: Active Interventions: Provide education on orientation to the wound center Notes: ` Wound/Skin Impairment Nursing Diagnoses: Impaired tissue integrity Knowledge deficit related to ulceration/compromised skin integrity Goals: Ulcer/skin breakdown will have a volume reduction of 80% by week 12 Date Initiated: 03/03/2018 Target Resolution Date: 07/02/2018 Goal Status: Active Interventions: Assess patient/caregiver ability to perform ulcer/skin care regimen upon admission and as needed Assess ulceration(s) every visit Notes: Electronic Signature(s) Signed: 03/07/2018 4:59:33 PM By: Alric Quan Entered By: Alric Quan on 03/03/2018 10:36:34 Dickens, Louie Boston ([id number]) -------------------------------------------------------------------------------- Pain Assessment Details Patient Name: Glenn Standard B. Date of Service: 03/03/2018 9:45 AM Medical Record Number: [id number] Patient Account Number: [id number] Date of Birth/Sex: November 16, 1955 (62 y.o. Male) Treating RN: Cornell Barman Primary Care Drucilla Cumber: Park Liter Other Clinician: Referring Coutney Wildermuth: Samara Deist Treating Grafton Warzecha/Extender: [name] in Treatment: 0 Active Problems Location of Pain Severity and Description of Pain Patient Has Paino Yes Site Locations Pain Location: Pain in Ulcers Rate the pain. Current Pain Level: 3 Character of Pain Describe the Pain: Sharp, Throbbing Pain Management and Medication Current Pain Management: Electronic Signature(s) Signed: 03/04/2018 6:17:48 PM By: Gretta Cool, BSN, RN, CWS, Kim RN, BSN Entered By: Gretta Cool, BSN, RN, CWS, Kim on 03/03/2018 09:59:06 Wesolowski, Louie Boston  ([id number]) -------------------------------------------------------------------------------- Patient/Caregiver Education Details Patient Name: [name] Date of Service: 03/03/2018 9:45 AM Medical Record Number: [id number] Patient Account Number: [id number] Date of Birth/Gender: [birth year] (62 y.o. Male) Treating RN: Roger Shelter Primary Care Physician: Park Liter Other Clinician: Referring Physician: Samara Deist Treating Physician/Extender: [name] in Treatment: 0 Education Assessment Education Provided To: Patient Education Topics Provided Welcome To [facility]: Handouts: Welcome To [facility] Methods: Explain/Verbal Responses: State content correctly Wound/Skin Impairment: Handouts: Caring for Your Ulcer Methods: Explain/Verbal Responses: State content correctly Electronic Signature(s) Signed: 03/04/2018 4:54:12 PM By: Roger Shelter Entered By: Roger Shelter on 03/03/2018 11:14:06 Urick, Javoni B. ([id number]) -------------------------------------------------------------------------------- Wound Assessment Details Patient Name: Glenn Standard B. Date of Service: 03/03/2018 9:45 AM Medical Record Number: [id number] Patient Account Number: [id number] Date of Birth/Sex: 1955-11-05 (62 y.o. Male) Treating RN: Cornell Barman Primary Care Lonette Stevison: Park Liter Other Clinician: Referring Amedee Cerrone: Samara Deist Treating Ellakate Gonsalves/Extender: [name] in Treatment: 0 Wound Status Wound Number: 1 Primary Arterial Insufficiency Ulcer Etiology: Wound Location: Right Toe Fifth - Circumfernential Wound Open Wounding Event: Gradually Appeared Status: Date Acquired: 01/01/2018 Comorbid Arrhythmia, Hypertension, Peripheral Arterial Weeks Of Treatment: 0 History: Disease, Type II Diabetes, Dementia Clustered Wound: No Pending Amputation On Presentation Photos Photo Uploaded By: Gretta Cool, BSN, RN, CWS, Kim on 03/04/2018  18:11:40 Wound Measurements Length: (cm) 2 Width: (cm) 2.2 Depth: (cm) 0.1 Area: (cm) 3.456 Volume: (cm) 0.346 % Reduction in Area: % Reduction in Volume: Epithelialization: None Tunneling: No Undermining: No Wound Description Classification: Unclassifiable Foul Od Wound Margin: Indistinct, nonvisible Slough/ Exudate Amount: None Present or After Cleansing: No Fibrino No Wound Bed Granulation Amount: None Present (0%) Exposed Structure Necrotic Amount: Large (67-100%) Fascia Exposed: No Necrotic Quality: Eschar Fat Layer (Subcutaneous Tissue) Exposed: No Tendon Exposed: No Muscle Exposed: No Joint Exposed: No Bone Exposed: No Periwound Skin Texture Texture Color Ocasio, Royden B. (  [id number]) No Abnormalities Noted: No No Abnormalities Noted: No Ecchymosis: Yes Moisture No Abnormalities Noted: No Wound Preparation Ulcer Cleansing: Not Cleansed Topical Anesthetic Applied: None Treatment Notes Wound #1 (Right, Circumferential Toe Fifth) 1. Cleansed with: Clean wound with Normal Saline 2. Anesthetic Topical Lidocaine 4% cream to wound bed prior to debridement 3. Peri-wound Care: Other peri-wound care (specify in notes) Notes betadine Electronic Signature(s) Signed: 03/03/2018 10:28:32 AM By: Gretta Cool, BSN, RN, CWS, Kim RN, BSN Entered By: Gretta Cool, BSN, RN, CWS, Kim on 03/03/2018 10:28:32 [name] ([id number]) -------------------------------------------------------------------------------- Wound Assessment Details Patient Name: Glenn Standard B. Date of Service: 03/03/2018 9:45 AM Medical Record Number: [id number] Patient Account Number: [id number] Date of Birth/Sex: [birth year] (62 y.o. Male) Treating RN: Cornell Barman Primary Care Boubacar Lerette: Park Liter Other Clinician: Referring Kida Digiulio: Samara Deist Treating Rever Pichette/Extender: [name] in Treatment: 0 Wound Status Wound Number: 2 Primary Arterial Insufficiency Ulcer Etiology: Wound  Location: Right Toe Fourth Wound Open Wounding Event: Gradually Appeared Status: Date Acquired: 01/01/2018 Comorbid Arrhythmia, Hypertension, Peripheral Arterial Weeks Of Treatment: 0 History: Disease, Type II Diabetes, Dementia Clustered Wound: No Pending Amputation On Presentation Photos Photo Uploaded By: Gretta Cool, BSN, RN, CWS, Kim on 03/04/2018 18:12:16 Wound Measurements Length: (cm) 3 Width: (cm) 5 Depth: (cm) 0.1 Area: (cm) 11.781 Volume: (cm) 1.178 % Reduction in Area: % Reduction in Volume: Epithelialization: None Wound Description Classification: Unclassifiable Foul Odor Wound Margin: Indistinct, nonvisible Slough/Fi Exudate Amount: None Present After Cleansing: No brino No Wound Bed Granulation Amount: None Present (0%) Exposed Structure Necrotic Amount: Large (67-100%) Fascia Exposed: No Necrotic Quality: Eschar Fat Layer (Subcutaneous Tissue) Exposed: No Tendon Exposed: No Muscle Exposed: No Joint Exposed: No Bone Exposed: No Periwound Skin Texture Texture Color Miler, Azavier B. ([id number]) No Abnormalities Noted: No No Abnormalities Noted: No Callus: No Atrophie Blanche: No Crepitus: No Cyanosis: No Excoriation: No Ecchymosis: Yes Induration: No Erythema: No Rash: No Hemosiderin Staining: No Scarring: No Mottled: No Pallor: No Moisture Rubor: No No Abnormalities Noted: No Dry / Scaly: No Maceration: No Wound Preparation Ulcer Cleansing: Not Cleansed Topical Anesthetic Applied: None Treatment Notes Wound #2 (Right Toe Fourth) 1. Cleansed with: Clean wound with Normal Saline 2. Anesthetic Topical Lidocaine 4% cream to wound bed prior to debridement 3. Peri-wound Care: Other peri-wound care (specify in notes) Notes betadine Electronic Signature(s) Signed: 03/03/2018 10:41:17 AM By: Gretta Cool, BSN, RN, CWS, Kim RN, BSN Entered By: Gretta Cool, BSN, RN, CWS, Kim on 03/03/2018 10:41:16 Hudnall, Louie Boston  ([id number]) -------------------------------------------------------------------------------- Wound Assessment Details Patient Name: Glenn Standard B. Date of Service: 03/03/2018 9:45 AM Medical Record Number: [id number] Patient Account Number: [id number] Date of Birth/Sex: June 27, 1956 (62 y.o. Male) Treating RN: Cornell Barman Primary Care Zana Biancardi: Park Liter Other Clinician: Referring Jese Comella: Samara Deist Treating Dayson Aboud/Extender: [name] Weeks in Treatment: 0 Wound Status Wound Number: 3 Primary Arterial Insufficiency Ulcer Etiology: Wound Location: Right Toe Great Wound Open Wounding Event: Gradually Appeared Status: Date Acquired: 01/01/2018 Comorbid Arrhythmia, Hypertension, Peripheral Arterial Weeks Of Treatment: 0 History: Disease, Type II Diabetes, Dementia Clustered Wound: No Pending Amputation On Presentation Photos Photo Uploaded By: Gretta Cool, BSN, RN, CWS, Kim on 03/04/2018 18:12:17 Wound Measurements Length: (cm) 0.6 Width: (cm) 0.6 Depth: (cm) 0.1 Area: (cm) 0.283 Volume: (cm) 0.028 % Reduction in Area: % Reduction in Volume: Epithelialization: None Tunneling: No Undermining: No Wound Description Classification: Partial Thickness Foul Odor Wound Margin: Flat and Intact Slough/Fi Exudate Amount: Medium Exudate Type: Serous Exudate Color: amber After Cleansing: No brino No Wound Bed Granulation Amount: Medium (  34-66%) Exposed Structure Granulation Quality: Red Fascia Exposed: No Necrotic Amount: None Present (0%) Fat Layer (Subcutaneous Tissue) Exposed: No Tendon Exposed: No Muscle Exposed: No Joint Exposed: No Bone Exposed: No Limited to Skin Breakdown Michelini, Darean B. ([id number]) Periwound Skin Texture Texture Color No Abnormalities Noted: Yes No Abnormalities Noted: Yes Moisture No Abnormalities Noted: Yes Wound Preparation Ulcer Cleansing: Rinsed/Irrigated with Saline Topical Anesthetic Applied: None Electronic  Signature(s) Signed: 03/03/2018 10:35:50 AM By: Gretta Cool, BSN, RN, CWS, Kim RN, BSN Entered By: Gretta Cool, BSN, RN, CWS, Kim on 03/03/2018 10:35:49 Noblet, Louie Boston ([id number]) -------------------------------------------------------------------------------- Vitals Details Patient Name: Glenn Standard B. Date of Service: 03/03/2018 9:45 AM Medical Record Number: [id number] Patient Account Number: [id number] Date of Birth/Sex: 1955-12-25 (62 y.o. Male) Treating RN: Cornell Barman Primary Care Nanako Stopher: Park Liter Other Clinician: Referring Dontay Harm: Samara Deist Treating Jamesyn Lindell/Extender: [name] in Treatment: 0 Vital Signs Time Taken: 09:59 Temperature (F): 98.2 Height (in): 70 Pulse (bpm): 79 Weight (lbs): 185 Respiratory Rate (breaths/min): 16 Body Mass Index (BMI): 26.5 Blood Pressure (mmHg): 145/74 Reference Range: 80 - 120 mg / dl Electronic Signature(s) Signed: 03/04/2018 6:17:48 PM By: Gretta Cool, BSN, RN, CWS, Kim RN, BSN Entered By: Gretta Cool, BSN, RN, CWS, Kim on 03/03/2018 10:00:22

## 2018-03-17 ENCOUNTER — Encounter: Payer: Medicare HMO | Admitting: Nurse Practitioner

## 2018-03-17 DIAGNOSIS — Z888 Allergy status to other drugs, medicaments and biological substances status: Secondary | ICD-10-CM | POA: Diagnosis not present

## 2018-03-17 DIAGNOSIS — R230 Cyanosis: Secondary | ICD-10-CM | POA: Diagnosis not present

## 2018-03-17 DIAGNOSIS — S91101A Unspecified open wound of right great toe without damage to nail, initial encounter: Secondary | ICD-10-CM | POA: Diagnosis not present

## 2018-03-17 DIAGNOSIS — S91104A Unspecified open wound of right lesser toe(s) without damage to nail, initial encounter: Secondary | ICD-10-CM | POA: Diagnosis not present

## 2018-03-17 DIAGNOSIS — I480 Paroxysmal atrial fibrillation: Secondary | ICD-10-CM | POA: Diagnosis not present

## 2018-03-17 DIAGNOSIS — E11621 Type 2 diabetes mellitus with foot ulcer: Secondary | ICD-10-CM | POA: Diagnosis not present

## 2018-03-17 DIAGNOSIS — L97519 Non-pressure chronic ulcer of other part of right foot with unspecified severity: Secondary | ICD-10-CM | POA: Diagnosis not present

## 2018-03-17 DIAGNOSIS — E1151 Type 2 diabetes mellitus with diabetic peripheral angiopathy without gangrene: Secondary | ICD-10-CM | POA: Diagnosis not present

## 2018-03-18 ENCOUNTER — Telehealth: Payer: Self-pay | Admitting: Family Medicine

## 2018-03-18 NOTE — Telephone Encounter (Signed)
Routing to provider  

## 2018-03-18 NOTE — Telephone Encounter (Signed)
Copied from [place] [id number]. Topic: Quick Communication - Rx Refill/Question >> Mar 18, 2018 11:24 AM Burchel, Abbi R wrote: Medication: oxyCODONE (OXYCONTIN) 10 mg 12 hr tablet  Preferred Pharmacy: [id number] - [place], [place] [address] [place] [address] Phone: [phone] Fax: [phone]    Pt's wife was advised that RX refills may take up to 3 business days. Pt will be out of medication today.

## 2018-03-18 NOTE — Telephone Encounter (Signed)
Vascular now managing his toe issues - he will need to work with them on pain management

## 2018-03-18 NOTE — Telephone Encounter (Signed)
Pt aware of message below

## 2018-03-21 NOTE — Progress Notes (Signed)
IVY, MERIWETHER ([id number]) Visit Report for 03/10/2018 Arrival Information Details Patient Name: NOBEL, BRAR. Date of Service: 03/10/2018 1:45 PM Medical Record Number: [id number] Patient Account Number: [id number] Date of Birth/Sex: May 16, 1956 (62 y.o. M) Treating RN: Montey Hora Primary Care Emmarie Sannes: Park Liter Other Clinician: Referring Khamryn Calderone: Park Liter Treating Oni Dietzman/Extender: [name] in Treatment: 1 Visit Information History Since Last Visit Added or deleted any medications: No Patient Arrived: Wheel Chair Any new allergies or adverse reactions: No Arrival Time: 13:39 Had a fall or experienced change in No Accompanied By: wife activities of daily living that may affect Transfer Assistance: None risk of falls: Patient Identification Verified: Yes Signs or symptoms of abuse/neglect since last visito No Secondary Verification Process Yes Hospitalized since last visit: No Completed: Implantable device outside of the clinic excluding No Patient Has Alerts: Yes cellular tissue based products placed in the center Patient Alerts: Patient on Blood since last visit: Thinner Has Dressing in Place as Prescribed: Yes Eliquis and 81mg  Pain Present Now: Yes Aspirin Type II Diabetes Electronic Signature(s) Signed: 03/10/2018 4:27:39 PM By: Montey Hora Entered By: Montey Hora on 03/10/2018 13:41:23 Fairburn, Louie Boston ([id number]) -------------------------------------------------------------------------------- Clinic Level of Care Assessment Details Patient Name: [name] Date of Service: 03/10/2018 1:45 PM Medical Record Number: [id number] Patient Account Number: [id number] Date of Birth/Sex: 1955-11-26 (62 y.o. M) Treating RN: [name] Primary Care Derec Mozingo: Park Liter Other Clinician: Referring Alayziah Tangeman: Park Liter Treating Zali Kamaka/Extender: [name] in Treatment: 1 Clinic Level of Care Assessment  Items TOOL 4 Quantity Score X - Use when only an EandM is performed on FOLLOW-UP visit 1 0 ASSESSMENTS - Nursing Assessment / Reassessment X - Reassessment of Co-morbidities (includes updates in patient status) 1 10 X- 1 5 Reassessment of Adherence to Treatment Plan ASSESSMENTS - Wound and Skin Assessment / Reassessment []  - Simple Wound Assessment / Reassessment - one wound 0 X- 3 5 Complex Wound Assessment / Reassessment - multiple wounds []  - 0 Dermatologic / Skin Assessment (not related to wound area) ASSESSMENTS - Focused Assessment []  - Circumferential Edema Measurements - multi extremities 0 []  - 0 Nutritional Assessment / Counseling / Intervention []  - 0 Lower Extremity Assessment (monofilament, tuning fork, pulses) []  - 0 Peripheral Arterial Disease Assessment (using hand held doppler) ASSESSMENTS - Ostomy and/or Continence Assessment and Care []  - Incontinence Assessment and Management 0 []  - 0 Ostomy Care Assessment and Management (repouching, etc.) PROCESS - Coordination of Care X - Simple Patient / Family Education for ongoing care 1 15 []  - 0 Complex (extensive) Patient / Family Education for ongoing care []  - 0 Staff obtains Programmer, systems, Records, Test Results / Process Orders []  - 0 Staff telephones HHA, Nursing Homes / Clarify orders / etc []  - 0 Routine Transfer to another Facility (non-emergent condition) []  - 0 Routine Hospital Admission (non-emergent condition) []  - 0 New Admissions / Biomedical engineer / Ordering NPWT, Apligraf, etc. []  - 0 Emergency Hospital Admission (emergent condition) X- 1 10 Simple Discharge Coordination Fomby, Rolland B. ([id number]) []  - 0 Complex (extensive) Discharge Coordination PROCESS - Special Needs []  - Pediatric / Minor Patient Management 0 []  - 0 Isolation Patient Management []  - 0 Hearing / Language / Visual special needs []  - 0 Assessment of Community assistance (transportation, D/C planning, etc.) []  -  0 Additional assistance / Altered mentation []  - 0 Support Surface(s) Assessment (bed, cushion, seat, etc.) INTERVENTIONS - Wound Cleansing / Measurement []  - Simple Wound Cleansing - one wound  0 X- 3 5 Complex Wound Cleansing - multiple wounds X- 1 5 Wound Imaging (photographs - any number of wounds) []  - 0 Wound Tracing (instead of photographs) []  - 0 Simple Wound Measurement - one wound X- 3 5 Complex Wound Measurement - multiple wounds INTERVENTIONS - Wound Dressings X - Small Wound Dressing one or multiple wounds 1 10 []  - 0 Medium Wound Dressing one or multiple wounds []  - 0 Large Wound Dressing one or multiple wounds X- 1 5 Application of Medications - topical []  - 0 Application of Medications - injection INTERVENTIONS - Miscellaneous []  - External ear exam 0 []  - 0 Specimen Collection (cultures, biopsies, blood, body fluids, etc.) []  - 0 Specimen(s) / Culture(s) sent or taken to Lab for analysis []  - 0 Patient Transfer (multiple staff / Civil Service fast streamer / Similar devices) []  - 0 Simple Staple / Suture removal (25 or less) []  - 0 Complex Staple / Suture removal (26 or more) []  - 0 Hypo / Hyperglycemic Management (close monitor of Blood Glucose) []  - 0 Ankle / Brachial Index (ABI) - do not check if billed separately X- 1 5 Vital Signs Odem, Lavere B. ([id number]) Has the patient been seen at the hospital within the last three years: Yes Total Score: 110 Level Of Care: New/Established - Level 3 Electronic Signature(s) Signed: 03/10/2018 4:46:33 PM By: Alric Quan Entered By: Alric Quan on 03/10/2018 15:44:17 Plemmons, Louie Boston ([id number]) -------------------------------------------------------------------------------- Lower Extremity Assessment Details Patient Name: [name] Date of Service: 03/10/2018 1:45 PM Medical Record Number: [id number] Patient Account Number: [id number] Date of Birth/Sex: 1956/07/08 (62 y.o. M) Treating RN: Montey Hora Primary Care Vick Filter: Park Liter Other Clinician: Referring Carson Bogden: Park Liter Treating Kynnedy Carreno/Extender: [name] in Treatment: 1 Vascular Assessment Pulses: Dorsalis Pedis Palpable: [Right:Yes] Doppler Audible: [Right:Yes] Posterior Tibial Palpable: [Right:No] Doppler Audible: [Right:Yes] Extremity colors, hair growth, and conditions: Extremity Color: [Right:Normal] Hair Growth on Extremity: [Right:No] Temperature of Extremity: [Right:Warm] Capillary Refill: [Right:< 3 seconds] Toe Nail Assessment Left: Right: Thick: Yes Discolored: Yes Deformed: No Improper Length and Hygiene: No Notes toes are mottled on right foot Electronic Signature(s) Signed: 03/10/2018 4:27:39 PM By: Montey Hora Entered By: Montey Hora on 03/10/2018 13:54:10 Marzo, Louie Boston ([id number]) -------------------------------------------------------------------------------- Multi Wound Chart Details Patient Name: [name] Date of Service: 03/10/2018 1:45 PM Medical Record Number: [id number] Patient Account Number: [id number] Date of Birth/Sex: 05/02/1956 (62 y.o. M) Treating RN: [name] Primary Care Sonji Starkes: Park Liter Other Clinician: Referring Syna Gad: Park Liter Treating Carmisha Larusso/Extender: [name] in Treatment: 1 Vital Signs Height(in): 70 Pulse(bpm): 68 Weight(lbs): 185 Blood Pressure(mmHg): 138/68 Body Mass Index(BMI): 27 Temperature(F): 98.0 Respiratory Rate 18 (breaths/min): Photos: [1:No Photos] [2:No Photos] [3:No Photos] Wound Location: [1:Right Toe Fifth - Circumfernential] [2:Right Toe Fourth] [3:Right Toe Great] Wounding Event: [1:Gradually Appeared] [2:Gradually Appeared] [3:Gradually Appeared] Primary Etiology: [1:Arterial Insufficiency Ulcer] [2:Arterial Insufficiency Ulcer] [3:Arterial Insufficiency Ulcer] Comorbid History: [1:Arrhythmia, Hypertension, Peripheral Arterial Disease, Type II Diabetes,  Dementia] [2:Arrhythmia, Hypertension, Peripheral Arterial Disease, Type II Diabetes, Dementia] [3:Arrhythmia, Hypertension, Peripheral Arterial Disease, Type  II Diabetes, Dementia] Date Acquired: [1:01/01/2018] [2:01/01/2018] [3:01/01/2018] Weeks of Treatment: [1:1] [2:1] [3:1] Wound Status: [1:Open] [2:Open] [3:Open] Pending Amputation on [1:Yes] [2:Yes] [3:Yes] Presentation: Measurements L x W x D [1:3x2x0.1] [2:3.5x5x0.1] [3:0.6x0.3x0.1] (cm) Area (cm) : [1:4.712] [2:13.744] [3:0.141] Volume (cm) : [1:0.471] [2:1.374] [3:0.014] % Reduction in Area: [1:-36.30%] [2:-16.70%] [3:50.20%] % Reduction in Volume: [1:-36.10%] [2:-16.60%] [3:50.00%] Classification: [1:Unclassifiable] [2:Unclassifiable] [3:Partial Thickness] Exudate Amount: [1:None Present] [2:None Present] [3:Medium]  Exudate Type: [1:N/A] [2:N/A] [3:Serous] Exudate Color: [1:N/A] [2:N/A] [3:amber] Wound Margin: [1:Indistinct, nonvisible] [2:Indistinct, nonvisible] [3:Flat and Intact] Granulation Amount: [1:None Present (0%)] [2:None Present (0%)] [3:Small (1-33%)] Granulation Quality: [1:N/A] [2:N/A] [3:Red] Necrotic Amount: [1:Large (67-100%)] [2:Large (67-100%)] [3:Large (67-100%)] Necrotic Tissue: [1:Eschar] [2:Eschar] [3:Adherent Slough] Exposed Structures: [1:Fascia: No Fat Layer (Subcutaneous Tissue) Exposed: No Tendon: No Muscle: No Joint: No Bone: No] [2:Fascia: No Fat Layer (Subcutaneous Tissue) Exposed: No Tendon: No Muscle: No Joint: No Bone: No] [3:Fascia: No Fat Layer (Subcutaneous Tissue) Exposed:  No Tendon: No Muscle: No Joint: No Bone: No Limited to Skin Breakdown] Epithelialization: [1:None] [2:None] [3:None] Periwound Skin Texture: No Abnormalities Noted Excoriation: No Excoriation: No Induration: No Induration: No Callus: No Callus: No Crepitus: No Crepitus: No Rash: No Rash: No Scarring: No Scarring: No Periwound Skin Moisture: No Abnormalities Noted Maceration: No Maceration: No Dry/Scaly:  No Dry/Scaly: No Periwound Skin Color: Ecchymosis: Yes Ecchymosis: Yes Atrophie Blanche: No Mottled: Yes Mottled: Yes Cyanosis: No Atrophie Blanche: No Ecchymosis: No Cyanosis: No Erythema: No Erythema: No Hemosiderin Staining: No Hemosiderin Staining: No Mottled: No Pallor: No Pallor: No Rubor: No Rubor: No Temperature: No Abnormality No Abnormality No Abnormality Tenderness on Palpation: Yes Yes Yes Wound Preparation: Ulcer Cleansing: Not Ulcer Cleansing: Not Ulcer Cleansing: Cleansed: dry eschar Cleansed: dry eschar Rinsed/Irrigated with Saline Topical Anesthetic Applied: Topical Anesthetic Applied: Topical Anesthetic Applied: None None Other: lidocaine 4% Treatment Notes Electronic Signature(s) Signed: 03/10/2018 2:10:59 PM By: [name] Entered By: [name] on 03/10/2018 14:10:59 Noseworthy, Louie Boston ([id number]) -------------------------------------------------------------------------------- [place] Details Patient Name: [name] Date of Service: 03/10/2018 1:45 PM Medical Record Number: [id number] Patient Account Number: [id number] Date of Birth/Sex: 01-26-1956 (62 y.o. M) Treating RN: [name] Primary Care Ryu Cerreta: Park Liter Other Clinician: Referring Nevea Spiewak: Park Liter Treating Thayden Lemire/Extender: [name] in Treatment: 1 Active Inactive ` Abuse / Safety / Falls / Self Care Management Nursing Diagnoses: Potential for falls Goals: Patient will not experience any injury related to falls Date Initiated: 03/03/2018 Target Resolution Date: 07/09/2018 Goal Status: Active Interventions: Assess Activities of Daily Living upon admission and as needed Assess fall risk on admission and as needed Assess: immobility, friction, shearing, incontinence upon admission and as needed Assess impairment of mobility on admission and as needed per policy Assess personal safety and home safety (as indicated) on  admission and as needed Assess self care needs on admission and as needed Notes: ` Nutrition Nursing Diagnoses: Imbalanced nutrition Impaired glucose control: actual or potential Potential for alteratiion in Nutrition/Potential for imbalanced nutrition Goals: Patient/caregiver agrees to and verbalizes understanding of need to use nutritional supplements and/or vitamins as prescribed Date Initiated: 03/03/2018 Target Resolution Date: 07/09/2018 Goal Status: Active Patient/caregiver will maintain therapeutic glucose control Date Initiated: 03/03/2018 Target Resolution Date: 06/11/2018 Goal Status: Active Interventions: Assess patient nutrition upon admission and as needed per policy Provide education on elevated blood sugars and impact on wound healing Provide education on nutrition KANNON, GRANDERSON ([id number]) Notes: ` Orientation to the Wound Care Program Nursing Diagnoses: Knowledge deficit related to the wound healing center program Goals: Patient/caregiver will verbalize understanding of the [place] Date Initiated: 03/03/2018 Target Resolution Date: 04/09/2018 Goal Status: Active Interventions: Provide education on orientation to the wound center Notes: ` Wound/Skin Impairment Nursing Diagnoses: Impaired tissue integrity Knowledge deficit related to ulceration/compromised skin integrity Goals: Ulcer/skin breakdown will have a volume reduction of 80% by week 12 Date Initiated: 03/03/2018 Target Resolution Date: 07/02/2018 Goal Status: Active Interventions:  Assess patient/caregiver ability to perform ulcer/skin care regimen upon admission and as needed Assess ulceration(s) every visit Notes: Electronic Signature(s) Signed: 03/10/2018 4:46:33 PM By: Alric Quan Entered By: Alric Quan on 03/10/2018 14:01:37 Lantzy, Arlyn B. ([id number]) -------------------------------------------------------------------------------- Pain Assessment  Details Patient Name: [name] Date of Service: 03/10/2018 1:45 PM Medical Record Number: [id number] Patient Account Number: [id number] Date of Birth/Sex: October 08, 1955 (62 y.o. M) Treating RN: Montey Hora Primary Care Faheem Ziemann: Park Liter Other Clinician: Referring Tyreik Delahoussaye: Park Liter Treating Nyjai Graff/Extender: [name] in Treatment: 1 Active Problems Location of Pain Severity and Description of Pain Patient Has Paino Yes Site Locations Pain Location: Pain in Ulcers With Dressing Change: Yes Duration of the Pain. Constant / Intermittento Constant Pain Management and Medication Current Pain Management: Electronic Signature(s) Signed: 03/10/2018 4:27:39 PM By: Montey Hora Entered By: Montey Hora on 03/10/2018 13:41:43 Samano, Louie Boston ([id number]) -------------------------------------------------------------------------------- Wound Assessment Details Patient Name: [name] Date of Service: 03/10/2018 1:45 PM Medical Record Number: [id number] Patient Account Number: [id number] Date of Birth/Sex: 11-10-1955 (62 y.o. M) Treating RN: Montey Hora Primary Care Alda Gaultney: Park Liter Other Clinician: Referring Gregoire Bennis: Park Liter Treating Cherika Jessie/Extender: [name] in Treatment: 1 Wound Status Wound Number: 1 Primary Arterial Insufficiency Ulcer Etiology: Wound Location: Right Toe Fifth - Circumfernential Wound Open Wounding Event: Gradually Appeared Status: Date Acquired: 01/01/2018 Comorbid Arrhythmia, Hypertension, Peripheral Arterial Weeks Of Treatment: 1 History: Disease, Type II Diabetes, Dementia Clustered Wound: No Pending Amputation On Presentation Photos Photo Uploaded By: Montey Hora on 03/10/2018 14:35:04 Wound Measurements Length: (cm) 3 Width: (cm) 2 Depth: (cm) 0.1 Area: (cm) 4.712 Volume: (cm) 0.471 % Reduction in Area: -36.3% % Reduction in Volume: -36.1% Epithelialization:  None Tunneling: No Undermining: No Wound Description Classification: Unclassifiable Foul O Wound Margin: Indistinct, nonvisible Slough Exudate Amount: None Present dor After Cleansing: No /Fibrino No Wound Bed Granulation Amount: None Present (0%) Exposed Structure Necrotic Amount: Large (67-100%) Fascia Exposed: No Necrotic Quality: Eschar Fat Layer (Subcutaneous Tissue) Exposed: No Tendon Exposed: No Muscle Exposed: No Joint Exposed: No Bone Exposed: No Periwound Skin Texture Texture Color Zwahlen, Marce B. ([id number]) No Abnormalities Noted: No No Abnormalities Noted: No Ecchymosis: Yes Moisture Mottled: Yes No Abnormalities Noted: No Temperature / Pain Temperature: No Abnormality Tenderness on Palpation: Yes Wound Preparation Ulcer Cleansing: Not Cleansed: dry eschar, Topical Anesthetic Applied: None Electronic Signature(s) Signed: 03/10/2018 4:27:39 PM By: Montey Hora Entered By: Montey Hora on 03/10/2018 13:50:04 Bernasconi, Louie [place] ([id number]) -------------------------------------------------------------------------------- Wound Assessment Details Patient Name: [name] Date of Service: 03/10/2018 1:45 PM Medical Record Number: [id number] Patient Account Number: [id number] Date of Birth/Sex: 19-Jan-1956 (62 y.o. M) Treating RN: Montey Hora Primary Care Azriel Jakob: Park Liter Other Clinician: Referring Henna Derderian: Park Liter Treating Mliss Wedin/Extender: [name] in Treatment: 1 Wound Status Wound Number: 2 Primary Arterial Insufficiency Ulcer Etiology: Wound Location: Right Toe Fourth Wound Open Wounding Event: Gradually Appeared Status: Date Acquired: 01/01/2018 Comorbid Arrhythmia, Hypertension, Peripheral Arterial Weeks Of Treatment: 1 History: Disease, Type II Diabetes, Dementia Clustered Wound: No Pending Amputation On Presentation Photos Photo Uploaded By: Montey Hora on 03/10/2018 14:35:05 Wound  Measurements Length: (cm) 3.5 Width: (cm) 5 Depth: (cm) 0.1 Area: (cm) 13.744 Volume: (cm) 1.374 % Reduction in Area: -16.7% % Reduction in Volume: -16.6% Epithelialization: None Tunneling: No Undermining: No Wound Description Classification: Unclassifiable Foul O Wound Margin: Indistinct, nonvisible Slough Exudate Amount: None Present dor After Cleansing: No /Fibrino No Wound Bed Granulation Amount: None Present (0%) Exposed Structure Necrotic Amount: Large (67-100%)  Fascia Exposed: No Necrotic Quality: Eschar Fat Layer (Subcutaneous Tissue) Exposed: No Tendon Exposed: No Muscle Exposed: No Joint Exposed: No Bone Exposed: No Periwound Skin Texture Texture Color Mcewen, Llewelyn B. ([id number]) No Abnormalities Noted: No No Abnormalities Noted: No Callus: No Atrophie Blanche: No Crepitus: No Cyanosis: No Excoriation: No Ecchymosis: Yes Induration: No Erythema: No Rash: No Hemosiderin Staining: No Scarring: No Mottled: Yes Pallor: No Moisture Rubor: No No Abnormalities Noted: No Dry / Scaly: No Temperature / Pain Maceration: No Temperature: No Abnormality Tenderness on Palpation: Yes Wound Preparation Ulcer Cleansing: Not Cleansed: dry eschar, Topical Anesthetic Applied: None Electronic Signature(s) Signed: 03/10/2018 4:27:39 PM By: Montey Hora Entered By: Montey Hora on 03/10/2018 13:50:57 Thornton, Louie [place] ([id number]) -------------------------------------------------------------------------------- Wound Assessment Details Patient Name: [name] Date of Service: 03/10/2018 1:45 PM Medical Record Number: [id number] Patient Account Number: [id number] Date of Birth/Sex: 05/31/1956 (62 y.o. M) Treating RN: Montey Hora Primary Care Desmin Daleo: Park Liter Other Clinician: Referring Elonzo Sopp: Park Liter Treating Jaking Thayer/Extender: [name] Weeks in Treatment: 1 Wound Status Wound Number: 3 Primary Arterial Insufficiency  Ulcer Etiology: Wound Location: Right Toe Great Wound Open Wounding Event: Gradually Appeared Status: Date Acquired: 01/01/2018 Comorbid Arrhythmia, Hypertension, Peripheral Arterial Weeks Of Treatment: 1 History: Disease, Type II Diabetes, Dementia Clustered Wound: No Pending Amputation On Presentation Photos Photo Uploaded By: Montey Hora on 03/10/2018 14:35:17 Wound Measurements Length: (cm) 0.6 Width: (cm) 0.3 Depth: (cm) 0.1 Area: (cm) 0.141 Volume: (cm) 0.014 % Reduction in Area: 50.2% % Reduction in Volume: 50% Epithelialization: None Tunneling: No Undermining: No Wound Description Classification: Partial Thickness Foul O Wound Margin: Flat and Intact Slough Exudate Amount: Medium Exudate Type: Serous Exudate Color: amber dor After Cleansing: No /Fibrino No Wound Bed Granulation Amount: Small (1-33%) Exposed Structure Granulation Quality: Red Fascia Exposed: No Necrotic Amount: Large (67-100%) Fat Layer (Subcutaneous Tissue) Exposed: No Necrotic Quality: Adherent Slough Tendon Exposed: No Muscle Exposed: No Joint Exposed: No Bone Exposed: No Limited to Skin Breakdown Flis, Valeriano B. ([id number]) Periwound Skin Texture Texture Color No Abnormalities Noted: No No Abnormalities Noted: No Callus: No Atrophie Blanche: No Crepitus: No Cyanosis: No Excoriation: No Ecchymosis: No Induration: No Erythema: No Rash: No Hemosiderin Staining: No Scarring: No Mottled: No Pallor: No Moisture Rubor: No No Abnormalities Noted: No Dry / Scaly: No Temperature / Pain Maceration: No Temperature: No Abnormality Tenderness on Palpation: Yes Wound Preparation Ulcer Cleansing: Rinsed/Irrigated with Saline Topical Anesthetic Applied: Other: lidocaine 4%, Electronic Signature(s) Signed: 03/10/2018 4:27:39 PM By: Montey Hora Entered By: Montey Hora on 03/10/2018 13:48:38 Stockhausen, Louie Boston  ([id number]) -------------------------------------------------------------------------------- Vitals Details Patient Name: [name] Date of Service: 03/10/2018 1:45 PM Medical Record Number: [id number] Patient Account Number: [id number] Date of Birth/Sex: [birth year] (62 y.o. M) Treating RN: Montey Hora Primary Care Jocelynn Gioffre: Park Liter Other Clinician: Referring Keymani Glynn: Park Liter Treating Latecia Miler/Extender: [name] in Treatment: 1 Vital Signs Time Taken: 13:41 Temperature (F): 98.0 Height (in): 70 Pulse (bpm): 85 Weight (lbs): 185 Respiratory Rate (breaths/min): 18 Body Mass Index (BMI): 26.5 Blood Pressure (mmHg): 138/68 Reference Range: 80 - 120 mg / dl Electronic Signature(s) Signed: 03/10/2018 4:27:39 PM By: Montey Hora Entered By: Montey Hora on 03/10/2018 13:42:40

## 2018-03-21 NOTE — Progress Notes (Signed)
[name], [name] ([id number]) Visit Report for 03/10/2018 Chief Complaint Document Details Patient Name: [name], [name]. Date of Service: 03/10/2018 1:45 PM Medical Record Number: [id number] Patient Account Number: [id number] Date of Birth/Sex: 1956/06/16 (62 y.o. M) Treating RN: [name] Primary Care Provider: Park [name] Other Clinician: Referring Provider: Park [name] Treating Provider/Extender: [name] in Treatment: 1 Information Obtained from: Patient Chief Complaint right toe Electronic Signature(s) Signed: 03/10/2018 2:14:14 PM By: [name] Entered By: [name] on 03/10/2018 14:14:14 [name] ([id number]) -------------------------------------------------------------------------------- HPI Details Patient Name: Glenn Standard B. Date of Service: 03/10/2018 1:45 PM Medical Record Number: [id number] Patient Account Number: [id number] Date of Birth/Sex: [birth year] (62 y.o. M) Treating RN: [name] Primary Care Provider: Park [name] Other Clinician: Referring Provider: Park [name] Treating Provider/Extender: [name] in Treatment: 1 History of Present Illness HPI Description: 03/03/18- He is here for initial evaluation of right toe wounds and tissue necrosis. He saw [name] on 7/23 where he was noted to have progressive ischemic changes to his toes. He was referred from [name] office to the wound clinic for possible hyperbaric oxygen therapy. At this time, based on the information we have at hand, there is no diagnosis that would qualify him for hyperbaric oxygen therapy. 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 He did not have intervention on 6/13. arterial studies performed on 6/19 showed resting ABI to the 0.99 (right) and 1.09 (left). 03/10/18-He is seen in follow up evaluation. Plain film x-ray ordered last week revealed no evidence of acute osseous abnormality and dorsal soft tissue swelling of the midfoot. Culture that was obtained last week from the right great toe was negative. He has an appointment with Dr [name] on 8/23. 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 The right great toe with a small, superficial, open area with resolution of erythema and improvement in edema; will continue with medihoney. He will follow up next week Electronic Signature(s) Signed: 03/10/2018 2:16:48 PM By: [name] Previous Signature: 03/10/2018 2:15:30 PM Version By: [name] Previous Signature: 03/10/2018 7:54:02 AM Version By: [name] Entered By: [name] on 03/10/2018 14:16:48 [name] ([id number]) -------------------------------------------------------------------------------- Physician Orders Details Patient Name: Glenn Standard B. Date of Service: 03/10/2018 1:45 PM Medical Record Number: [id number] Patient Account Number: [id number] Date of Birth/Sex: [birth year] (62 y.o. M) Treating RN: [name] Primary Care Provider: Park [name] Other Clinician: Referring Provider: Park [name] Treating Provider/Extender: [name] in Treatment: 1 Verbal / Phone Orders: Yes Clinician: [name], [name] Back and Verified: Yes Diagnosis Coding Wound Cleansing Wound #1 Right,Circumferential Toe Fifth o Clean wound with Normal Saline. Wound #2 Right Toe Fourth o Clean wound with Normal Saline. Wound #3 Right Toe Great o Clean wound with Normal Saline. Anesthetic (add to  Medication List) Wound #3 Right Toe Great o Topical Lidocaine 4% cream applied to wound bed prior to debridement (In Clinic Only). Primary Wound Dressing Wound #3 Right Toe Great o Medihoney gel Secondary Dressing Wound #1 Right,Circumferential Toe Fifth o Other - paint with betadine Wound #2 Right  Toe Fourth o Other - paint with betadine Wound #3 Right Toe Great o Dry Gauze o Conform/Kerlix Dressing Change Frequency Wound #1 Right,Circumferential Toe Fifth o Change dressing every day. o Change dressing every day. Wound #2 Right Toe Fourth o Change dressing every day. Follow-up Appointments Wound #1 Right,Circumferential Toe Fifth o Return Appointment in 1 week. Wound #2 Right Toe Fourth o Return Appointment in 1 week. [name] ([id number]) Wound #3 Right Toe Great o Return Appointment in 1 week. Edema Control Wound #1 Right,Circumferential Toe Fifth o Elevate legs to the level of the heart and pump ankles as often as possible Wound #2 Right Toe Fourth o Elevate legs to the level of the heart and pump ankles as often as possible Wound #3 Right Toe Great o Elevate legs to the level of the heart and pump ankles as often as possible Additional Orders / Instructions Wound #1 Right,Circumferential Toe Fifth o Increase protein intake. Wound #2 Right Toe Fourth o Increase protein intake. Wound #3 Right Toe Great o Increase protein intake. Patient Medications Allergies: atorvastatin, gabapentin Notifications Medication Indication Start End lidocaine DOSE 1 - topical 4 % cream - 1 cream topical Electronic Signature(s) Signed: 03/10/2018 4:46:33 PM By: [name] Signed: 03/10/2018 5:08:35 PM By: [name] Entered By: [name] on 03/10/2018 14:03:59 [name] ([id number]) -------------------------------------------------------------------------------- Prescription 03/10/2018 Patient Name: [name] Provider: [name] Date of Birth: 06-27-1956 NPI#: [id number] Sex: M DEA#: [id number] Phone #: [phone] License #: Patient Address: [name] [name], [place] [name] Allergies atorvastatin Reaction: rash gabapentin Reaction: dizziness and vomiting Medication Medication: Route: Strength: Form: lidocaine topical 4% cream Class: TOPICAL LOCAL ANESTHETICS Dose: Frequency / Time: Indication: 1 1 cream topical Number of Refills: Number of Units: 0 Generic Substitution: Start Date: End Date: Administered at Substitution Permitted Facility: Yes Time Administered: Time Discontinued: Note to Pharmacy: [facility]): Date(s): Electronic Signature(s) YAASIR, MENKEN ([id number]) Signed: 03/10/2018 4:46:33 PM By: [name] Signed: 03/10/2018 5:08:35 PM By: [name] Entered By: [name] on 03/10/2018 14:03:59 Davisson, [name] ([id number]) --------------------------------------------------------------------------------  Problem List Details Patient Name: Glenn Standard B. Date of Service: 03/10/2018 1:45 PM Medical Record Number: [id number] Patient Account Number: [id number] Date of Birth/Sex: 18-Apr-1956 (62 y.o. M) Treating RN: [name] Primary Care Provider: Park [name] Other Clinician: Referring Provider: Park [name] Treating Provider/Extender: [name] in Treatment: 1 Active Problems ICD-10 Evaluated Encounter Code Description Active Date Today Diagnosis E11.621 Type 2 diabetes mellitus with foot ulcer 03/03/2018 No Yes S91.101S Unspecified open wound of right great toe without damage to 03/03/2018 No Yes nail, sequela R23.0 Cyanosis 03/03/2018 No Yes I73.9 Peripheral vascular disease, unspecified 03/03/2018 No Yes Inactive Problems Resolved Problems Electronic  Signature(s) Signed: 03/10/2018 2:10:51 PM By: [name] Entered By: [name] on 03/10/2018 14:10:51 Robards, [name] ([id number]) -------------------------------------------------------------------------------- Progress Note Details Patient Name: Glenn Standard B. Date of Service: 03/10/2018 1:45 PM Medical Record Number: [id number] Patient Account Number: [id number] Date of Birth/Sex: 1955-12-01 (62 y.o. M) Treating RN: [name] Primary Care Provider: Park [name] Other Clinician: Referring Provider: Park [name] Treating Provider/Extender: [name] in Treatment: 1 Subjective Chief Complaint Information obtained from Patient right toe History of Present Illness (HPI) 03/03/18- He is here for initial evaluation of right toe wounds and tissue necrosis. He saw [name] on 7/23 where he was noted to have  progressive ischemic changes to his toes. He was referred from [name] office to the wound clinic for possible hyperbaric oxygen therapy. At this time, based on the information we have at hand, there is no diagnosis that would qualify him for hyperbaric oxygen therapy. 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 He did not have intervention on 6/13. arterial studies performed on 6/19 showed resting ABI to the 0.99 (right) and 1.09 (left). 03/10/18-He is seen in follow up evaluation. Plain film x-ray ordered last week revealed no evidence of acute osseous abnormality and dorsal soft tissue swelling of  the midfoot. Culture that was obtained last week from the right great toe was negative. He has an appointment with Dr [name] on 8/23. 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 The right great toe with a small, superficial, open area with resolution of erythema and improvement in edema; will continue with medihoney. He will follow up next week Objective Constitutional Vitals Time Taken: 1:41 PM, Height: 70 in, Weight: 185 lbs, BMI: 26.5, Temperature: 98.0 F, Pulse: 85 bpm, Respiratory Rate: 18 breaths/min, Blood Pressure: 138/68 mmHg. Integumentary (Hair, Skin) Wound #1 status is Open. Original cause of wound was Gradually Appeared. The wound is located on the Right,Circumferential Toe Fifth. The wound measures 3cm length x 2cm width x 0.1cm depth; 4.712cm^2 area and 0.471cm^3 volume. There is no tunneling or undermining noted. There is a none present amount of drainage noted. The wound margin is indistinct and nonvisible. There is no granulation within the wound bed. There is a large (67-100%) amount of necrotic tissue within the wound bed including Eschar. The periwound skin appearance exhibited: Ecchymosis, Mottled. Periwound temperature was noted as No Abnormality. The periwound has tenderness on palpation. Wound #2 status is Open. Original cause of wound was Gradually Appeared. The wound is located on the Right Toe Fourth. The wound measures 3.5cm length x 5cm width x 0.1cm depth; 13.744cm^2 area and 1.374cm^3 volume. There is no Korver, Oaklyn B. ([id number]) tunneling or undermining noted. There is a none present amount of drainage noted. The wound margin is indistinct and nonvisible. There is no granulation within the wound bed. There is a large (67-100%) amount of necrotic tissue within the wound bed including Eschar. The periwound skin appearance exhibited: Ecchymosis, Mottled. The periwound skin appearance did not exhibit: Callus, Crepitus, Excoriation,  Induration, Rash, Scarring, Dry/Scaly, Maceration, Atrophie Blanche, Cyanosis, Hemosiderin Staining, Pallor, Rubor, Erythema. Periwound temperature was noted as No Abnormality. The periwound has tenderness on palpation. Wound #3 status is Open. Original cause of wound was Gradually Appeared. The wound is located on the Right Toe Great. The wound measures 0.6cm length x 0.3cm width x 0.1cm depth; 0.141cm^2 area and 0.014cm^3 volume. The wound is limited to skin breakdown. There is no tunneling or undermining noted. There is a medium amount of serous drainage noted. The wound margin is flat and intact. There is small (1-33%) red granulation within the wound bed. There is a large (67-100%) amount of necrotic tissue within the wound bed including Adherent Slough. The periwound skin appearance did not exhibit: Callus,  Crepitus, Excoriation, Induration, Rash, Scarring, Dry/Scaly, Maceration, Atrophie Blanche, Cyanosis, Ecchymosis, Hemosiderin Staining, Mottled, Pallor, Rubor, Erythema. Periwound temperature was noted as No Abnormality. The periwound has tenderness on palpation. Assessment Active Problems ICD-10 Type 2 diabetes mellitus with foot ulcer Unspecified open wound of right great toe without damage to nail, sequela Cyanosis Peripheral vascular disease, unspecified Plan Wound Cleansing: Wound #1 Right,Circumferential Toe Fifth: Clean wound with Normal Saline. Wound #2 Right Toe Fourth: Clean wound with Normal Saline. Wound #3 Right Toe Great: Clean wound with Normal Saline. Anesthetic (add to Medication List): Wound #3 Right Toe Great: Topical Lidocaine 4% cream applied to wound bed prior to debridement (In Clinic Only). Primary Wound Dressing: Wound #3 Right Toe Great: Medihoney gel Secondary Dressing: Wound #1 Right,Circumferential Toe Fifth: Other - paint with betadine Wound #2 Right Toe Fourth: Other - paint with betadine Wound #3 Right Toe Great: Dry  Gauze Conform/Kerlix Dressing Change Frequency: Jha, Traveon B. ([id number]) Wound #1 Right,Circumferential Toe Fifth: Change dressing every day. Change dressing every day. Wound #2 Right Toe Fourth: Change dressing every day. Follow-up Appointments: Wound #1 Right,Circumferential Toe Fifth: Return Appointment in 1 week. Wound #2 Right Toe Fourth: Return Appointment in 1 week. Wound #3 Right Toe Great: Return Appointment in 1 week. Edema Control: Wound #1 Right,Circumferential Toe Fifth: Elevate legs to the level of the heart and pump ankles as often as possible Wound #2 Right Toe Fourth: Elevate legs to the level of the heart and pump ankles as often as possible Wound #3 Right Toe Great: Elevate legs to the level of the heart and pump ankles as often as possible Additional Orders / Instructions: Wound #1 Right,Circumferential Toe Fifth: Increase protein intake. Wound #2 Right Toe Fourth: Increase protein intake. Wound #3 Right Toe Great: Increase protein intake. The following medication(s) was prescribed: lidocaine topical 4 % cream 1 1 cream topical was prescribed at facility Electronic Signature(s) Signed: 03/10/2018 2:17:03 PM By: [name] Previous Signature: 03/10/2018 2:15:41 PM Version By: [name] Entered By: [name] on 03/10/2018 14:17:02 Kwiecinski, [name] ([id number]) -------------------------------------------------------------------------------- SuperBill Details Patient Name: Glenn Standard B. Date of Service: 03/10/2018 Medical Record Number: [id number] Patient Account Number: [id number] Date of Birth/Sex: 02-Apr-1956 (62 y.o. M) Treating RN: [name] Primary Care Provider: Park [name] Other Clinician: Referring Provider: Park [name] Treating Provider/Extender: [name] in Treatment: 1 Diagnosis Coding ICD-10 Codes Code Description E11.621 Type 2 diabetes mellitus with foot ulcer S91.101S Unspecified open wound of right  great toe without damage to nail, sequela R23.0 Cyanosis I73.9 Peripheral vascular disease, unspecified Facility Procedures CPT4 Code: [id number] Description: 99213 - WOUND CARE VISIT-LEV 3 EST PT Modifier: Quantity: 1 Physician Procedures CPT4 Code: [id number] Description: 79480 - WC PHYS LEVEL 3 - EST PT ICD-10 Diagnosis Description E11.621 Type 2 diabetes mellitus with foot ulcer S91.101S Unspecified open wound of right great toe without damage to I73.9 Peripheral vascular disease, unspecified R23.0 Cyanosis Modifier: nail, sequela Quantity: 1 Electronic Signature(s) Signed: 03/10/2018 3:45:30 PM By: [name] Signed: 03/10/2018 5:08:35 PM By: [name] Previous Signature: 03/10/2018 2:16:00 PM Version By: [name] Entered By: [name] on 03/10/2018 15:45:30

## 2018-03-22 ENCOUNTER — Ambulatory Visit (INDEPENDENT_AMBULATORY_CARE_PROVIDER_SITE_OTHER): Payer: Medicare HMO | Admitting: Physician Assistant

## 2018-03-22 ENCOUNTER — Encounter: Payer: Self-pay | Admitting: Physician Assistant

## 2018-03-22 ENCOUNTER — Other Ambulatory Visit: Payer: Self-pay

## 2018-03-22 VITALS — BP 132/78 | HR 81 | Temp 98.3°F | Ht 70.0 in | Wt 184.1 lb

## 2018-03-22 DIAGNOSIS — I96 Gangrene, not elsewhere classified: Secondary | ICD-10-CM | POA: Diagnosis not present

## 2018-03-22 DIAGNOSIS — Z720 Tobacco use: Secondary | ICD-10-CM

## 2018-03-22 DIAGNOSIS — E118 Type 2 diabetes mellitus with unspecified complications: Secondary | ICD-10-CM

## 2018-03-22 NOTE — Progress Notes (Signed)
Subjective:    Patient ID: Glenn Gerold., male    DOB: 1955-09-28, 62 y.o.   MRN: [id number]  [name]. is a 62 y.o. male presenting on 03/22/2018 for Foot Pain (pt states he is concern of his right foot swelling/ Pt states that he does not want to keep taking oxicodone anymore as it makes him drowsy but he would like to get something else for pain)   HPI   History of DM II, smoking, poor circulatory status. Has had iliac stents placed by vascular surgery. Has two necrotic toes on his right foot, the 4th and 5th toes. He has seen Dr. Vickki Muff the podiatrist for this on 02/22/2018 and has follow up on 03/29/2018. Dr. Vickki Muff prescribed hyperbaric oxygen treatment and follow up examination for likely amputation. Patient has seen vascular surgery on 03/16/2018 and ABI were 0.99 in the right and left 1.11. He was recommended open toe compression stockings which he has not worn. He repeatedly asks what he can do to save the other toes. Asks if there is any other pain management besides narcotics.   Social History   Tobacco Use  . Smoking status: Former Smoker    Packs/day: 0.25    Years: 30.00    Pack years: 7.50    Types: Cigarettes  . Smokeless tobacco: Never Used  Substance Use Topics  . Alcohol use: Yes    Alcohol/week: 8.0 standard drinks    Types: 8 Cans of beer per week    Comment: occasionally  . Drug use: No    Review of Systems Per HPI unless specifically indicated above     Objective:    BP 132/78   Pulse 81   Temp 98.3 F (36.8 C) (Oral)   Ht 5\' 10"  (1.778 m)   Wt 184 lb 1.6 oz (83.5 kg)   SpO2 98%   BMI 26.42 kg/m   Wt Readings from Last 3 Encounters:  03/22/18 184 lb 1.6 oz (83.5 kg)  03/16/18 189 lb 9.6 oz (86 kg)  03/09/18 181 lb 8 oz (82.3 kg)    Physical Exam  Constitutional: He appears well-developed and well-nourished.  Musculoskeletal:  Right foot with 2+ pitting edema to level of shoes. 4th and 5th toes are necrotic. First three toes are  blue-purple.   Skin: Skin is warm and dry.  Psychiatric: He has a normal mood and affect. His behavior is normal.   Results for orders placed or performed during the hospital encounter of 03/03/18  Aerobic Culture (superficial specimen)  Result Value Ref Range   Specimen Description      WOUND Performed at [facility], [address] [place], [place] [address]    Special Requests      NONE Performed at [facility], [place], [place] [address]    Gram Stain NO WBC SEEN NO ORGANISMS SEEN     Culture      NO GROWTH 3 DAYS Performed at [facility], [place] [address] [place], [place] [address]    Report Status 03/06/2018 FINAL       Assessment & Plan:  1. Type 2 diabetes mellitus with complication, unspecified whether long term insulin use ([place])  This is well controlled.   2. 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 Vickki Muff after unsuccessful hyperbaric treatment with wound. Had long, cyclical conversation with patient about how he needs to remain abstinent from smoking, control his diabetes. Informed that  amputating 4th and 5th toes does not guarantee preservation of his remaining toes. Might try topical lidocaine. No NSAIDs due to anticoagulation. Tylenol PRN. Try compression stockings as recommended by vascular.  3. Tobacco abuse  Quit 2 months ago.     Follow up plan: Return if symptoms worsen or fail to improve.  Carles Collet, PA-C [facility] 03/22/2018, 4:50 PM

## 2018-03-24 ENCOUNTER — Ambulatory Visit: Payer: Medicare HMO | Admitting: Nurse Practitioner

## 2018-03-25 ENCOUNTER — Encounter (INDEPENDENT_AMBULATORY_CARE_PROVIDER_SITE_OTHER): Payer: Medicare HMO

## 2018-03-25 ENCOUNTER — Ambulatory Visit (INDEPENDENT_AMBULATORY_CARE_PROVIDER_SITE_OTHER): Payer: Medicare HMO | Admitting: Vascular Surgery

## 2018-03-28 ENCOUNTER — Ambulatory Visit: Payer: Medicare HMO | Admitting: Physician Assistant

## 2018-03-29 ENCOUNTER — Telehealth: Payer: Self-pay | Admitting: Family Medicine

## 2018-03-29 DIAGNOSIS — I96 Gangrene, not elsewhere classified: Secondary | ICD-10-CM | POA: Diagnosis not present

## 2018-03-29 DIAGNOSIS — R69 Illness, unspecified: Secondary | ICD-10-CM | POA: Diagnosis not present

## 2018-03-29 NOTE — Telephone Encounter (Signed)
Pt walked in to inquire about getting a refill on oxy. Advised per pcp will refill will not be immediately.

## 2018-03-29 NOTE — Telephone Encounter (Signed)
Appointment scheduled.

## 2018-03-29 NOTE — Telephone Encounter (Signed)
He was given a refill of that on 03/09/18- if he is needing to take it that often, he will need to be seen.

## 2018-03-31 ENCOUNTER — Other Ambulatory Visit: Payer: Self-pay

## 2018-03-31 ENCOUNTER — Encounter: Payer: Self-pay | Admitting: Family Medicine

## 2018-03-31 ENCOUNTER — Ambulatory Visit (INDEPENDENT_AMBULATORY_CARE_PROVIDER_SITE_OTHER): Payer: Medicare HMO | Admitting: Family Medicine

## 2018-03-31 VITALS — BP 143/83 | HR 80 | Temp 98.0°F | Ht 70.0 in | Wt 189.3 lb

## 2018-03-31 DIAGNOSIS — Z01818 Encounter for other preprocedural examination: Secondary | ICD-10-CM

## 2018-03-31 DIAGNOSIS — R413 Other amnesia: Secondary | ICD-10-CM

## 2018-03-31 DIAGNOSIS — I96 Gangrene, not elsewhere classified: Secondary | ICD-10-CM

## 2018-03-31 DIAGNOSIS — I6930 Unspecified sequelae of cerebral infarction: Secondary | ICD-10-CM | POA: Diagnosis not present

## 2018-03-31 LAB — COAGUCHEK XS/INR WAIVED
INR: 1.1 (ref 0.9–1.1)
Prothrombin Time: 13.4 s

## 2018-03-31 MED ORDER — GABAPENTIN 400 MG PO CAPS: 400.0000 mg | ORAL_CAPSULE | Freq: Three times a day (TID) | ORAL | 1 refills | Status: DC

## 2018-03-31 MED ORDER — OXYCODONE-ACETAMINOPHEN 10-325 MG PO TABS: 1.0000 | ORAL_TABLET | Freq: Three times a day (TID) | ORAL | 0 refills | Status: DC | PRN

## 2018-03-31 NOTE — Progress Notes (Signed)
DIMETRIUS, MONTFORT ([id number]) Visit Report for 03/17/2018 Chief Complaint Document Details Patient Name: Glenn, [name]. Date of Service: 03/17/2018 1:00 PM Medical Record Number: [id number] Patient Account Number: [id number] Date of Birth/Sex: April 15, 1956 (62 y.o. M) Treating RN: Roger Shelter Primary Care Provider: Park Liter Other Clinician: Referring Provider: Park Liter Treating Provider/Extender: [name] in Treatment: 2 Information Obtained from: Patient Chief Complaint right toe Electronic Signature(s) Signed: 03/17/2018 1:45:49 PM By: [name] Entered By: [name] on 03/17/2018 13:45:49 [name] ([id number]) -------------------------------------------------------------------------------- HPI Details Patient Name: Glenn Standard B. Date of Service: 03/17/2018 1:00 PM Medical Record Number: [id number] Patient Account Number: [id number] Date of Birth/Sex: 27-Feb-1956 (62 y.o. M) Treating RN: Roger Shelter Primary Care Provider: Park Liter Other Clinician: Referring Provider: Park Liter Treating Provider/Extender: [name] in Treatment: 2 History of Present Illness HPI Description: 03/03/18- He is here for initial evaluation of right toe wounds and tissue necrosis. He saw Dr. Vickki Muff on 7/23 where he was noted to have progressive ischemic changes to his toes. He was referred from Dr. Fellows office to the wound clinic for possible hyperbaric oxygen therapy. At this time, based on the information we have at hand, there is no diagnosis that would qualify him for hyperbaric oxygen therapy. He underwent an angiogram on 6/13 by Dr. 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 He did not have intervention on 6/13. arterial studies performed on 6/19 showed resting ABI to the 0.99 (right) and 1.09 (left). 03/10/18-He is seen in follow up evaluation. Plain film x-ray ordered last week revealed no evidence of acute osseous abnormality and dorsal soft tissue swelling of the midfoot. Culture that was obtained last week from the right great toe was negative. He has an appointment with Dr Lucky Cowboy on 8/23. 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 The right great toe with a small, superficial, open area with resolution of erythema and improvement in edema; will continue with medihoney. He will follow up next week 03/17/18-He is seen in follow-up evaluation. He was able to reschedule his appointment with vascular medicine and was seen on 8/14. In office ABIs are similar to studies on 6/19, right toe waveform is blunted with known small vessel disease; they are recommending compression therapy which the patient is refusing to wear. 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 Vickki Muff. The patient's wife is going to try and move that appointment up to next week. The patient does exhibit right foot pain consistent with rest pain, relieved with dependent position. There is cyanotic discoloration to right toes 1-3 and along the lateral aspect and heel of the right foot. The wound to the dorsal aspect of the right great toe is larger, dry; we will initiate santyl. He can follow up in 1-2 weeks Electronic Signature(s) Signed: 03/17/2018 1:51:50 PM By: [name] Entered By: [name] on 03/17/2018 13:51:50 [name] ([id number]) -------------------------------------------------------------------------------- Physician Orders Details Patient Name: Glenn Standard  B. Date of Service: 03/17/2018 1:00 PM Medical Record Number: [id number] Patient Account Number: [id number] Date of Birth/Sex: May 11, 1956 (62 y.o. M) Treating RN: Roger Shelter Primary  Care Provider: Park Liter Other Clinician: Referring Provider: Park Liter Treating Provider/Extender: [name] in Treatment: 2 Verbal / Phone Orders: No Diagnosis Coding Wound Cleansing Wound #1 Right,Circumferential Toe Fifth o Clean wound with Normal Saline. Wound #2 Right Toe Fourth o Clean wound with Normal Saline. Wound #3 Right Toe Great o Clean wound with Normal Saline. Anesthetic (add to Medication List) Wound #3 Right Toe Great o Topical Lidocaine 4% cream applied to wound bed prior to debridement (In Clinic Only). Primary Wound Dressing Wound #3 Right Toe Great o Santyl Ointment Secondary Dressing Wound #1 Right,Circumferential Toe Fifth o Other - paint with betadine Wound #2 Right Toe Fourth o Other - paint with betadine Wound #3 Right Toe Great o Dry Gauze o Conform/Kerlix Dressing Change Frequency Wound #1 Right,Circumferential Toe Fifth o Change dressing every day. o Change dressing every day. Wound #2 Right Toe Fourth o Change dressing every day. Follow-up Appointments Wound #1 Right,Circumferential Toe Fifth o Return Appointment in 1 week. Wound #2 Right Toe Fourth o Return Appointment in 1 week. [name], [name] ([id number]) Wound #3 Right Toe Great o Return Appointment in 1 week. Edema Control Wound #1 Right,Circumferential Toe Fifth o Elevate legs to the level of the heart and pump ankles as often as possible Wound #2 Right Toe Fourth o Elevate legs to the level of the heart and pump ankles as often as possible Wound #3 Right Toe Great o Elevate legs to the level of the heart and pump ankles as often as possible Additional Orders / Instructions Wound #1 Right,Circumferential Toe Fifth o Increase protein  intake. Wound #2 Right Toe Fourth o Increase protein intake. Wound #3 Right Toe Great o Increase protein intake. Patient Medications Allergies: atorvastatin, gabapentin Notifications Medication Indication Start End Santyl 03/18/2018 DOSE topical 250 unit/gram ointment - ointment topical; apply to toe wound daily Electronic Signature(s) Signed: 03/17/2018 1:22:39 PM By: [name] Entered By: [name] on 03/17/2018 13:22:39 Riggenbach, [name] ([id number]) -------------------------------------------------------------------------------- Problem List Details Patient Name: Glenn Standard B. Date of Service: 03/17/2018 1:00 PM Medical Record Number: [id number] Patient Account Number: [id number] Date of Birth/Sex: 1956/07/07 (62 y.o. M) Treating RN: Roger Shelter Primary Care Provider: Park Liter Other Clinician: Referring Provider: Park Liter Treating Provider/Extender: [name] in Treatment: 2 Active Problems ICD-10 Evaluated Encounter Code Description Active Date Today Diagnosis E11.621 Type 2 diabetes mellitus with foot ulcer 03/03/2018 No Yes S91.101S Unspecified open wound of right great toe without damage to 03/03/2018 No Yes nail, sequela R23.0 Cyanosis 03/03/2018 No Yes I73.9 Peripheral vascular disease, unspecified 03/03/2018 No Yes Inactive Problems Resolved Problems Electronic Signature(s) Signed: 03/17/2018 1:45:28 PM By: [name] Previous Signature: 03/17/2018 1:41:02 PM Version By: [name] Entered By: [name] on 03/17/2018 13:45:27 Macfarlane, [name] ([id number]) -------------------------------------------------------------------------------- Progress Note Details Patient Name: Glenn Standard B. Date of Service: 03/17/2018 1:00 PM Medical Record Number: [id number] Patient Account Number: [id number] Date of Birth/Sex: February 16, 1956 (62 y.o. M) Treating RN: Roger Shelter Primary Care Provider: Park Liter Other  Clinician: Referring Provider: Park Liter Treating Provider/Extender: [name] in Treatment: 2 Subjective Chief Complaint Information obtained from Patient right toe History of Present Illness (HPI) 03/03/18- He is here for initial evaluation of right toe wounds and tissue necrosis. He saw Dr. Vickki Muff on 7/23 where he was noted to have progressive ischemic changes to his toes. He was referred from Dr. Fellows office to the wound clinic for possible hyperbaric oxygen therapy. At this time, based on the information we  have at hand, there is no diagnosis that would qualify him for hyperbaric oxygen therapy. He underwent an angiogram on 6/13 by Dr. 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 He did not have intervention on 6/13. arterial studies performed on 6/19 showed resting ABI to the 0.99 (right) and 1.09 (left). 03/10/18-He is seen in follow up evaluation. Plain film x-ray ordered last week revealed no evidence of acute osseous abnormality and dorsal soft tissue swelling of the midfoot. Culture that was obtained last week from the right great toe was negative. He has an appointment with Dr Lucky Cowboy on 8/23. 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 The right great toe with a small, superficial, open area with resolution of erythema and improvement in edema; will continue with medihoney. He will follow up next week 03/17/18-He is seen in follow-up evaluation. He was able to reschedule his appointment with vascular medicine and was  seen on 8/14. In office ABIs are similar to studies on 6/19, right toe waveform is blunted with known small vessel disease; they are recommending compression therapy which the patient is refusing to wear. 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 Vickki Muff. The patient's wife is going to try and move that appointment up to next week. The patient does exhibit right foot pain consistent with rest pain, relieved with dependent position. There is cyanotic discoloration to right toes 1-3 and along the lateral aspect and heel of the right foot. The wound to the dorsal aspect of the right great toe is larger, dry; we will initiate santyl. He can follow up in 1-2 weeks Objective Constitutional Vitals Time Taken: 12:43 PM, Height: 70 in, Weight: 185 lbs, BMI: 26.5, Temperature: 98.3 F, Pulse: 86 bpm, Respiratory Rate: 16 breaths/min, Blood Pressure: 147/67 mmHg. Integumentary (Hair, Skin) Wound #1 status is Open. Original cause of wound was Gradually Appeared. The wound is located on the Right,Circumferential Toe Fifth. The wound measures 3cm length x 2cm width x 0.1cm depth; 4.712cm^2 area and [name], [name] ([id number]) 0.471cm^3 volume. There is no tunneling or undermining noted. There is a none present amount of drainage noted. The wound margin is indistinct and nonvisible. There is no granulation within the wound bed. There is a large (67-100%) amount of necrotic tissue within the wound bed including Eschar. The periwound skin appearance exhibited: Ecchymosis, Mottled. Periwound temperature was noted as No Abnormality. The periwound has tenderness on palpation. Wound #2 status is Open. Original cause of wound was Gradually Appeared. The wound is located on the Right Toe Fourth. The wound measures 3.5cm length x 5cm width x 0.1cm depth; 13.744cm^2 area and 1.374cm^3 volume. There is no tunneling or undermining noted. There is a none present amount of drainage noted. The wound  margin is indistinct and nonvisible. There is no granulation within the wound bed. There is a large (67-100%) amount of necrotic tissue within the wound bed including Eschar. The periwound skin appearance exhibited: Ecchymosis, Mottled. The periwound skin appearance did not exhibit: Callus, Crepitus, Excoriation, Induration, Rash, Scarring, Dry/Scaly, Maceration, Atrophie Blanche, Cyanosis, Hemosiderin Staining, Pallor, Rubor, Erythema. Periwound temperature was noted as No  Abnormality. The periwound has tenderness on palpation. Wound #3 status is Open. Original cause of wound was Gradually Appeared. The wound is located on the Right Toe Great. The wound measures 0.8cm length x 0.9cm width x 0.1cm depth; 0.565cm^2 area and 0.057cm^3 volume. There is no tunneling or undermining noted. There is a none present amount of drainage noted. The wound margin is flat and intact. There is no granulation within the wound bed. There is a medium (34-66%) amount of necrotic tissue within the wound bed including Adherent Slough. The periwound skin appearance did not exhibit: Callus, Crepitus, Excoriation, Induration, Rash, Scarring, Dry/Scaly, Maceration, Atrophie Blanche, Cyanosis, Ecchymosis, Hemosiderin Staining, Mottled, Pallor, Rubor, Erythema. Periwound temperature was noted as No Abnormality. The periwound has tenderness on palpation. Assessment Active Problems ICD-10 Type 2 diabetes mellitus with foot ulcer Unspecified open wound of right great toe without damage to nail, sequela Cyanosis Peripheral vascular disease, unspecified Plan Wound Cleansing: Wound #1 Right,Circumferential Toe Fifth: Clean wound with Normal Saline. Wound #2 Right Toe Fourth: Clean wound with Normal Saline. Wound #3 Right Toe Great: Clean wound with Normal Saline. Anesthetic (add to Medication List): Wound #3 Right Toe Great: Topical Lidocaine 4% cream applied to wound bed prior to debridement (In Clinic Only). Primary  Wound Dressing: Wound #3 Right Toe Great: Santyl Ointment Secondary Dressing: Wound #1 Right,Circumferential Toe Fifth: Other - paint with betadine [name], [name] ([id number]) Wound #2 Right Toe Fourth: Other - paint with betadine Wound #3 Right Toe Great: Dry Gauze Conform/Kerlix Dressing Change Frequency: Wound #1 Right,Circumferential Toe Fifth: Change dressing every day. Change dressing every day. Wound #2 Right Toe Fourth: Change dressing every day. Follow-up Appointments: Wound #1 Right,Circumferential Toe Fifth: Return Appointment in 1 week. Wound #2 Right Toe Fourth: Return Appointment in 1 week. Wound #3 Right Toe Great: Return Appointment in 1 week. Edema Control: Wound #1 Right,Circumferential Toe Fifth: Elevate legs to the level of the heart and pump ankles as often as possible Wound #2 Right Toe Fourth: Elevate legs to the level of the heart and pump ankles as often as possible Wound #3 Right Toe Great: Elevate legs to the level of the heart and pump ankles as often as possible Additional Orders / Instructions: Wound #1 Right,Circumferential Toe Fifth: Increase protein intake. Wound #2 Right Toe Fourth: Increase protein intake. Wound #3 Right Toe Great: Increase protein intake. The following medication(s) was prescribed: Santyl topical 250 unit/gram ointment ointment topical; apply to toe wound daily starting 03/18/2018 Electronic Signature(s) Signed: 03/17/2018 1:52:04 PM By: [name] Entered By: [name] on 03/17/2018 13:52:04 [name], [name] ([id number]) -------------------------------------------------------------------------------- SuperBill Details Patient Name: Glenn Standard B. Date of Service: 03/17/2018 Medical Record Number: [id number] Patient Account Number: [id number] Date of Birth/Sex: 14-Jun-1956 (62 y.o. M) Treating RN: Roger Shelter Primary Care Provider: Park Liter Other Clinician: Referring Provider: Park Liter Treating Provider/Extender: [name] in Treatment: 2 Diagnosis Coding ICD-10 Codes Code Description E11.621 Type 2 diabetes mellitus with foot ulcer S91.101S Unspecified open wound of right great toe without damage to nail, sequela R23.0 Cyanosis I73.9 Peripheral vascular disease, unspecified Facility Procedures CPT4 Code: [id number] Description: [phone] - WOUND CARE VISIT-LEV 2 EST PT Modifier: Quantity: 1 Physician Procedures CPT4 Code: [id number] Description: 27253 - WC PHYS LEVEL 3 - EST PT ICD-10 Diagnosis Description E11.621 Type 2 diabetes mellitus with foot ulcer S91.101S Unspecified open wound of right great toe without damage to Modifier: nail, sequela Quantity: 1 Electronic Signature(s) Signed: 03/17/2018 1:52:25 PM By: [name] Entered By:  Velia Pamer on 03/17/2018 13:52:24

## 2018-03-31 NOTE — Progress Notes (Addendum)
BP (!) 143/83   Pulse 80   Temp 98 F (36.7 C) (Oral)   Ht 5\' 10"  (1.778 m)   Wt 189 lb 5 oz (85.9 kg)   SpO2 97%   BMI 27.16 kg/m    Subjective:    Patient ID: [name], male    DOB: [birth year], 62 y.o.   MRN: [id number]  HPI: Daxtin Leiker. is a 62 y.o. male  Chief Complaint  Patient presents with  . Pain  . Paperwork    for surgery   He stopped taking his oxycontin about 2 weeks ago because it made him feel like a zombie. Now his foot has been hurting a lot- having shooting pains. He was taking it very occasionally before due to headaches after his stroke. He wants to take the medicine again to help with the pain. He notes that tylenol is not helping, he has been taking his oxycontin, but it is very expensive. 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   Has never had any problems with surgery in the past. He is not a good historian. His wife doesn't know of any issues with surgery- but he has not had any since they have been married. He denies any issues with anesthesia in the past. He always went home when he was supposed to. He never had any post-op N/V. No problem with extubation. No known family history of issues with anesthesia. No family history of malignant hyperthermia. He has no issues with SOB. No CP. He can walk up stairs except for the pain in his toes without chest pain or SOB. Does not feel like he's had any palpitations. Otherwise feeling OK.  Wife is concerned about his memory loss since his stroke- it has gotten much worse since his 2nd stroke. Doesn't want to discuss it right now, but would like to get him into see neurology to see what can be done.   Relevant past medical, surgical, family and social history reviewed and updated as indicated. Interim medical history since our last visit reviewed. Allergies and medications reviewed and updated.  Review of Systems  Constitutional: Negative.   HENT: Negative.     Respiratory: Negative.   Cardiovascular: Negative.   Gastrointestinal: Negative.   Genitourinary: Negative.   Musculoskeletal: Positive for arthralgias and gait problem. Negative for back pain, joint swelling, myalgias, neck pain and neck stiffness.  Skin: Negative.   Neurological: Positive for dizziness, weakness and light-headedness. Negative for tremors, seizures, syncope, facial asymmetry, speech difficulty, numbness and headaches.  Hematological: Negative.   Psychiatric/Behavioral: Negative.     Per HPI unless specifically indicated above     Objective:    BP (!) 143/83   Pulse 80   Temp 98 F (36.7 C) (Oral)   Ht 5\' 10"  (1.778 m)   Wt 189 lb 5 oz (85.9 kg)   SpO2 97%   BMI 27.16 kg/m   Wt Readings from Last 3 Encounters:  03/31/18 189 lb 5 oz (85.9 kg)  03/22/18 184 lb 1.6 oz (83.5 kg)  03/16/18 189 lb 9.6 oz (86 kg)    Physical Exam  Constitutional: He is oriented to person, place, and time. He appears well-developed and well-nourished. No distress.  HENT:  Head: Normocephalic and atraumatic.  Right Ear: Hearing normal.  Left Ear: Hearing normal.  Nose: Nose normal.  Eyes: Conjunctivae and lids are normal. Right eye exhibits no discharge. Left eye exhibits no discharge.  No scleral icterus.  Cardiovascular: Normal rate, regular rhythm, normal heart sounds and intact distal pulses. Exam reveals no gallop and no friction rub.  No murmur heard. Pulmonary/Chest: Effort normal and breath sounds normal. No stridor. No respiratory distress. He has no wheezes. He has no rales. He exhibits no tenderness.  Musculoskeletal: Normal range of motion.  Neurological: He is alert and oriented to person, place, and time.  Skin: Skin is warm, dry and intact. No rash noted. He is not diaphoretic. No erythema. No pallor.  4th and 5th toes on the R necrotic   Psychiatric: He has a normal mood and affect. His speech is normal and behavior is normal. Cognition and memory are impaired.  He exhibits abnormal recent memory and abnormal remote memory.  Nursing note and vitals reviewed.      Assessment & Plan:   Problem List Items Addressed This Visit      Other   History of stroke with residual deficit    Having more issues since the 2nd stroke. Wife would like him to see neurology. Referral generated today.      Relevant Orders   Ambulatory referral to Neurology   Memory loss    Having more issues since the 2nd stroke. Wife would like him to see neurology. Referral generated today.      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 Will start 400mg  gabapentin TID and will give PRN percocet until he has his surgery on 04/13/18.       Other Visit Diagnoses    Pre-op examination    -  Primary   EKG not in A. fib today. Will get him back in with his cardiologist for clearance. Labs drawn today- if they are normal will be cleared from non-cardiac side.    Relevant Orders   CBC with Differential/Platelet   CoaguChek XS/INR Waived   Comprehensive metabolic panel   EKG 49-SWHQ (Completed)       Follow up plan: Return End of September/October.    ADDENDUM: 04/01/18 4:2PM: labs reviewed. Anemic with Hgb 11.5- down 1.3g/dL from last month. Will start iron TID and recheck next Thursday prior to giving clearance.   ADDENDUM: 04/07/18 4:20PM: Labs from cardiology reviewed. Hgb down to 10.8. Seen today by my colleague (note attached) +FOBT on exam today. NOT CLEARED FOR SURGERY UNTIL ANEMIA AND RECTAL BLEEDING FIGURED OUT

## 2018-03-31 NOTE — Patient Instructions (Signed)
Cardiac Clearance Dr. Donivan Scull Office 04/06/18 2PM Christell Faith

## 2018-03-31 NOTE — Assessment & Plan Note (Signed)
Having more issues since the 2nd stroke. Wife would like him to see neurology. Referral generated today.

## 2018-03-31 NOTE — Assessment & Plan Note (Signed)
Having significant pain from the toes now. Will start 400mg  gabapentin TID and will give PRN percocet until he has his surgery on 04/13/18.

## 2018-03-31 NOTE — Progress Notes (Signed)
DURIEL, DEERY ([id number]) Visit Report for 03/17/2018 Arrival Information Details Patient Name: RACHID, PARHAM. Date of Service: 03/17/2018 1:00 PM Medical Record Number: [id number] Patient Account Number: [id number] Date of Birth/Sex: 28-Mar-1956 (62 y.o. M) Treating RN: Secundino Ginger Primary Care Allannah Kempen: Park Liter Other Clinician: Referring Demontez Novack: Park Liter Treating Henslee Lottman/Extender: [name] in Treatment: 2 Visit Information History Since Last Visit Added or deleted any medications: No Patient Arrived: Wheel Chair Any new allergies or adverse reactions: No Arrival Time: 12:36 Had a fall or experienced change in No Accompanied By: wife activities of daily living that may affect Transfer Assistance: None risk of falls: Patient Identification Verified: Yes Signs or symptoms of abuse/neglect since last visito No Secondary Verification Process Yes Hospitalized since last visit: No Completed: Implantable device outside of the clinic excluding No Patient Has Alerts: Yes cellular tissue based products placed in the center Patient Alerts: Patient on Blood since last visit: Thinner Has Dressing in Place as Prescribed: Yes Eliquis and 81mg  Pain Present Now: No Aspirin Type II Diabetes Electronic Signature(s) Signed: 03/17/2018 3:17:24 PM By: Secundino Ginger Entered By: Secundino Ginger on 03/17/2018 12:41:35 Deignan, Louie Boston ([id number]) -------------------------------------------------------------------------------- Clinic Level of Care Assessment Details Patient Name: [name] Date of Service: 03/17/2018 1:00 PM Medical Record Number: [id number] Patient Account Number: [id number] Date of Birth/Sex: February 01, 1956 (62 y.o. M) Treating RN: Roger Shelter Primary Care Deonne Rooks: Park Liter Other Clinician: Referring Charvez Voorhies: Park Liter Treating Robby Bulkley/Extender: [name] in Treatment: 2 Clinic Level of Care Assessment Items TOOL 4  Quantity Score X - Use when only an EandM is performed on FOLLOW-UP visit 1 0 ASSESSMENTS - Nursing Assessment / Reassessment X - Reassessment of Co-morbidities (includes updates in patient status) 1 10 X- 1 5 Reassessment of Adherence to Treatment Plan ASSESSMENTS - Wound and Skin Assessment / Reassessment X - Simple Wound Assessment / Reassessment - one wound 1 5 []  - 0 Complex Wound Assessment / Reassessment - multiple wounds []  - 0 Dermatologic / Skin Assessment (not related to wound area) ASSESSMENTS - Focused Assessment []  - Circumferential Edema Measurements - multi extremities 0 []  - 0 Nutritional Assessment / Counseling / Intervention []  - 0 Lower Extremity Assessment (monofilament, tuning fork, pulses) []  - 0 Peripheral Arterial Disease Assessment (using hand held doppler) ASSESSMENTS - Ostomy and/or Continence Assessment and Care []  - Incontinence Assessment and Management 0 []  - 0 Ostomy Care Assessment and Management (repouching, etc.) PROCESS - Coordination of Care X - Simple Patient / Family Education for ongoing care 1 15 []  - 0 Complex (extensive) Patient / Family Education for ongoing care []  - 0 Staff obtains Programmer, systems, Records, Test Results / Process Orders []  - 0 Staff telephones HHA, Nursing Homes / Clarify orders / etc []  - 0 Routine Transfer to another Facility (non-emergent condition) []  - 0 Routine Hospital Admission (non-emergent condition) []  - 0 New Admissions / Biomedical engineer / Ordering NPWT, Apligraf, etc. []  - 0 Emergency Hospital Admission (emergent condition) X- 1 10 Simple Discharge Coordination Ditmore, Hasan B. ([id number]) []  - 0 Complex (extensive) Discharge Coordination PROCESS - Special Needs []  - Pediatric / Minor Patient Management 0 []  - 0 Isolation Patient Management []  - 0 Hearing / Language / Visual special needs []  - 0 Assessment of Community assistance (transportation, D/C planning, etc.) []  - 0 Additional  assistance / Altered mentation []  - 0 Support Surface(s) Assessment (bed, cushion, seat, etc.) INTERVENTIONS - Wound Cleansing / Measurement X - Simple Wound Cleansing - one  wound 1 5 []  - 0 Complex Wound Cleansing - multiple wounds X- 1 5 Wound Imaging (photographs - any number of wounds) []  - 0 Wound Tracing (instead of photographs) X- 1 5 Simple Wound Measurement - one wound []  - 0 Complex Wound Measurement - multiple wounds INTERVENTIONS - Wound Dressings X - Small Wound Dressing one or multiple wounds 1 10 []  - 0 Medium Wound Dressing one or multiple wounds []  - 0 Large Wound Dressing one or multiple wounds []  - 0 Application of Medications - topical []  - 0 Application of Medications - injection INTERVENTIONS - Miscellaneous []  - External ear exam 0 []  - 0 Specimen Collection (cultures, biopsies, blood, body fluids, etc.) []  - 0 Specimen(s) / Culture(s) sent or taken to Lab for analysis []  - 0 Patient Transfer (multiple staff / Civil Service fast streamer / Similar devices) []  - 0 Simple Staple / Suture removal (25 or less) []  - 0 Complex Staple / Suture removal (26 or more) []  - 0 Hypo / Hyperglycemic Management (close monitor of Blood Glucose) []  - 0 Ankle / Brachial Index (ABI) - do not check if billed separately X- 1 5 Vital Signs Vanbeek, Burnard B. ([id number]) Has the patient been seen at the hospital within the last three years: Yes Total Score: 75 Level Of Care: New/Established - Level 2 Electronic Signature(s) Signed: 03/17/2018 2:06:43 PM By: Roger Shelter Entered By: Roger Shelter on 03/17/2018 13:18:24 Serda, Louie Boston ([id number]) -------------------------------------------------------------------------------- Encounter Discharge Information Details Patient Name: [name] Date of Service: 03/17/2018 1:00 PM Medical Record Number: [id number] Patient Account Number: [id number] Date of Birth/Sex: 01/04/1956 (62 y.o. M) Treating RN: Cornell Barman Primary  Care Olander Friedl: Park Liter Other Clinician: Referring Criston Chancellor: Park Liter Treating Joel Cowin/Extender: [name] in Treatment: 2 Encounter Discharge Information Items Discharge Condition: Stable Ambulatory Status: Ambulatory Discharge Destination: Home Transportation: Private Auto Accompanied By: spouse Schedule Follow-up Appointment: Yes Clinical Summary of Care: Electronic Signature(s) Signed: 03/17/2018 4:35:38 PM By: Gretta Cool, BSN, RN, CWS, Kim RN, BSN Entered By: Gretta Cool, BSN, RN, CWS, Kim on 03/17/2018 13:27:55 Janusz, Louie Boston ([id number]) -------------------------------------------------------------------------------- Lower Extremity Assessment Details Patient Name: ARVLE, GRABE B. Date of Service: 03/17/2018 1:00 PM Medical Record Number: [id number] Patient Account Number: [id number] Date of Birth/Sex: [birth year] (62 y.o. M) Treating RN: Secundino Ginger Primary Care Jinx Gilden: Park Liter Other Clinician: Referring Donley Harland: Park Liter Treating Dejai Schubach/Extender: [name] Weeks in Treatment: 2 Electronic Signature(s) Signed: 03/17/2018 3:17:24 PM By: Secundino Ginger Entered By: Secundino Ginger on 03/17/2018 12:55:46 Dobberstein, Louie Boston ([id number]) -------------------------------------------------------------------------------- Multi Wound Chart Details Patient Name: [name] Date of Service: 03/17/2018 1:00 PM Medical Record Number: [id number] Patient Account Number: [id number] Date of Birth/Sex: 22-Jun-1956 (62 y.o. 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 Comorbid History: Arrhythmia, Hypertension, Arrhythmia, Hypertension, Arrhythmia, Hypertension, Peripheral Arterial Disease, Peripheral Arterial Disease, Peripheral Arterial Disease, Type II Diabetes, Dementia Type II Diabetes, Dementia Type II Diabetes, Dementia Date Acquired: 01/01/2018 01/01/2018 01/01/2018 Weeks of Treatment: 2 2 2  Wound Status: Open Open Open Pending Amputation on Yes Yes Yes Presentation: Measurements L x W x D 3x2x0.1  3.5x5x0.1 0.8x0.9x0.1 (cm) Area (cm) : 4.712 13.744 0.565 Volume (cm) : 0.471 1.374 0.057 % Reduction in Area: -36.30% -16.70% -99.60% % Reduction in Volume: -36.10% -16.60% -103.60% Classification: Unclassifiable Unclassifiable Partial Thickness Exudate Amount: None Present None Present None Present Wound Margin: Indistinct, nonvisible Indistinct, nonvisible Flat and Intact Granulation Amount: None Present (0%) None Present (0%) None Present (0%) Necrotic Amount: Large (67-100%) Large (67-100%) Medium (34-66%) Necrotic Tissue: Eschar Eschar Adherent Slough Exposed Structures: Fascia: No Fascia: No Fascia: No Fat Layer (Subcutaneous Fat Layer (Subcutaneous Fat Layer (Subcutaneous Tissue) Exposed: No Tissue) Exposed: No Tissue) Exposed: No Tendon: No Tendon: No Tendon: No Muscle: No Muscle: No Muscle: No Esbenshade, Aitan B. ([id number]) Joint: No Joint: No Joint: No Bone: No Bone: No Bone: No Epithelialization: None None None Periwound Skin Texture: No Abnormalities Noted Excoriation: No Excoriation: No Induration: No Induration: No Callus: No Callus: No Crepitus: No Crepitus: No Rash: No Rash: No Scarring: No Scarring: No Periwound Skin Moisture: No Abnormalities Noted Maceration: No Maceration: No Dry/Scaly: No Dry/Scaly:  No Periwound Skin Color: Ecchymosis: Yes Ecchymosis: Yes Atrophie Blanche: No Mottled: Yes Mottled: Yes Cyanosis: No Atrophie Blanche: No Ecchymosis: No Cyanosis: No Erythema: No Erythema: No Hemosiderin Staining: No Hemosiderin Staining: No Mottled: No Pallor: No Pallor: No Rubor: No Rubor: No Temperature: No Abnormality No Abnormality No Abnormality Tenderness on Palpation: Yes Yes Yes Wound Preparation: Ulcer Cleansing: Not Ulcer Cleansing: Not Ulcer Cleansing: Cleansed: dry eschar Cleansed: dry eschar Rinsed/Irrigated with Saline Topical Anesthetic Applied: Topical Anesthetic Applied: Topical Anesthetic Applied: None, Other: lidocaine 4% None, Other: lidocaine 4% Other: lidocaine 4% Treatment Notes Wound #1 (Right, Circumferential Toe Fifth) 1. Cleansed with: Clean wound with Normal Saline 2. Anesthetic Topical Lidocaine 4% cream to wound bed prior to debridement 3. Peri-wound Care: Other peri-wound care (specify in notes) Notes betadine 4th and 5th toes; santyl great toe Wound #2 (Right Toe Fourth) 1. Cleansed with: Clean wound with Normal Saline 2. Anesthetic Topical Lidocaine 4% cream to wound bed prior to debridement 3. Peri-wound Care: Other peri-wound care (specify in notes) Notes betadine 4th and 5th toes; santyl great toe Wound #3 (Right Toe Great) 1. Cleansed with: Clean wound with Normal Saline 2. Anesthetic Topical Lidocaine 4% cream to wound bed prior to debridement Holaday, Erika B. ([id number]) 3. Peri-wound Care: Other peri-wound care (specify in notes) Notes betadine 4th and 5th toes; santyl great toe Electronic Signature(s) Signed: 03/17/2018 1:45:40 PM By: [name] Entered By: [name] on 03/17/2018 13:45:39 Westwood, Louie Boston ([id number]) -------------------------------------------------------------------------------- [place] Details Patient Name: [name] Date of Service: 03/17/2018 1:00  PM Medical Record Number: [id number] Patient Account Number: [id number] Date of Birth/Sex: 01/12/1956 (62 y.o. M) Treating RN: Roger Shelter Primary Care Verley Pariseau: Park Liter Other Clinician: Referring Tianna Baus: Park Liter Treating Mikyah Alamo/Extender: [name] in Treatment: 2 Active Inactive ` Abuse / Safety / Falls / Self Care Management Nursing Diagnoses: Potential for falls Goals: Patient will not experience any injury related to falls Date Initiated: 03/03/2018 Target Resolution Date: 07/09/2018 Goal Status: Active Interventions: Assess Activities of Daily Living upon admission and as needed Assess fall risk on admission and as needed Assess: immobility, friction, shearing, incontinence upon admission and as needed Assess impairment of mobility on admission and as needed per policy Assess personal safety and home safety (as indicated) on admission and as needed Assess self care needs on admission and as needed Notes: ` Nutrition Nursing Diagnoses: Imbalanced nutrition Impaired glucose control: actual or potential Potential for alteratiion  in Nutrition/Potential for imbalanced nutrition Goals: Patient/caregiver agrees to and verbalizes understanding of need to use nutritional supplements and/or vitamins as prescribed Date Initiated: 03/03/2018 Target Resolution Date: 07/09/2018 Goal Status: Active Patient/caregiver will maintain therapeutic glucose control Date Initiated: 03/03/2018 Target Resolution Date: 06/11/2018 Goal Status: Active Interventions: Assess patient nutrition upon admission and as needed per policy Provide education on elevated blood sugars and impact on wound healing Provide education on nutrition ABDULKADIR, EMMANUEL ([id number]) Notes: ` Orientation to the Wound Care Program Nursing Diagnoses: Knowledge deficit related to the wound healing center program Goals: Patient/caregiver will verbalize understanding of the [place] Date Initiated: 03/03/2018 Target Resolution Date: 04/09/2018 Goal Status: Active Interventions: Provide education on orientation to the wound center Notes: ` Wound/Skin Impairment Nursing Diagnoses: Impaired tissue integrity Knowledge deficit related to ulceration/compromised skin integrity Goals: Ulcer/skin breakdown will have a volume reduction of 80% by week 12 Date Initiated: 03/03/2018 Target Resolution Date: 07/02/2018 Goal Status: Active Interventions: Assess patient/caregiver ability to perform ulcer/skin care regimen upon admission and as needed Assess ulceration(s) every visit Notes: Electronic Signature(s) Signed: 03/17/2018 2:06:43 PM By: Roger Shelter Entered By: Roger Shelter on 03/17/2018 13:13:58 Lenk, Louie Boston ([id number]) -------------------------------------------------------------------------------- Pain Assessment Details Patient Name: [name] Date of Service: 03/17/2018 1:00 PM Medical Record Number: [id number] Patient Account Number: [id number] Date of Birth/Sex: 12-16-1955 (62 y.o. M) Treating RN: Secundino Ginger Primary Care Thayer Embleton: Park Liter Other Clinician: Referring Lewanda Perea: Park Liter Treating Garrette Caine/Extender: [name] in Treatment: 2 Active Problems Location of Pain Severity and Description of Pain Patient Has Paino No Site Locations Pain Management and Medication Current Pain Management: Goals for Pain Management pt denies any pain at this time. Electronic Signature(s) Signed: 03/17/2018 3:17:24 PM By: Secundino Ginger Entered By: Secundino Ginger on 03/17/2018 12:42:00 Ekholm, Louie Boston ([id number]) -------------------------------------------------------------------------------- Patient/Caregiver Education Details Patient Name: [name] Date of Service: 03/17/2018 1:00 PM Medical Record Number: [id number] Patient Account Number: [id number] Date of Birth/Gender: March 23, 1956 (62 y.o. M) Treating RN: Cornell Barman Primary Care Physician: Park Liter Other Clinician: Referring Physician: Park Liter Treating Physician/Extender: [name] in Treatment: 2 Education Assessment Education Provided To: Caregiver Education Topics Provided Wound/Skin Impairment: Handouts: Caring for Your Ulcer, Other: wound care as prescribed Methods: Demonstration, Explain/Verbal Responses: State content correctly Electronic Signature(s) Signed: 03/17/2018 4:35:38 PM By: Gretta Cool, BSN, RN, CWS, Kim RN, BSN Entered By: Gretta Cool, BSN, RN, CWS, Kim on 03/17/2018 13:28:17 Jue, Louie Boston ([id number]) -------------------------------------------------------------------------------- Wound Assessment Details Patient Name: [name] Date of Service: 03/17/2018 1:00 PM Medical Record Number: [id number] Patient Account Number: [id number] Date of Birth/Sex: [birth year] (62 y.o. M) Treating RN: Secundino Ginger Primary Care Deyton Ellenbecker: Park Liter Other Clinician: Referring Jaden Batchelder: Park Liter Treating Lenora Gomes/Extender: [name] in Treatment: 2 Wound Status Wound Number: 1 Primary Arterial Insufficiency Ulcer Etiology: Wound Location: Right Toe Fifth - Circumfernential Wound Open Wounding Event: Gradually Appeared Status: Date Acquired: 01/01/2018 Comorbid Arrhythmia, Hypertension, Peripheral Arterial Weeks Of Treatment: 2 History: Disease, Type II Diabetes, Dementia Clustered Wound: No Pending Amputation On Presentation Photos Photo Uploaded By: Secundino Ginger on 03/17/2018 13:10:55 Wound Measurements Length: (cm) 3 Width: (cm) 2 Depth: (cm) 0.1 Area: (cm) 4.712 Volume: (cm) 0.471 % Reduction in Area: -36.3% % Reduction in Volume: -36.1% Epithelialization: None Tunneling: No Undermining: No Wound Description Classification: Unclassifiable Foul Od Wound Margin: Indistinct, nonvisible Slough/ Exudate Amount: None Present or After Cleansing: No Fibrino No Wound Bed Granulation  Amount: None Present (0%) Exposed Structure Necrotic Amount:  Large (67-100%) Fascia Exposed: No Necrotic Quality: Eschar Fat Layer (Subcutaneous Tissue) Exposed: No Tendon Exposed: No Muscle Exposed: No Joint Exposed: No Bone Exposed: No Periwound Skin Texture Texture Color Turpin, Davante B. ([id number]) No Abnormalities Noted: No No Abnormalities Noted: No Ecchymosis: Yes Moisture Mottled: Yes No Abnormalities Noted: No Temperature / Pain Temperature: No Abnormality Tenderness on Palpation: Yes Wound Preparation Ulcer Cleansing: Not Cleansed: dry eschar, Topical Anesthetic Applied: None, Other: lidocaine 4%, Electronic Signature(s) Signed: 03/17/2018 3:17:24 PM By: Secundino Ginger Entered By: Secundino Ginger on 03/17/2018 12:52:00 Heisler, Louie Boston ([id number]) -------------------------------------------------------------------------------- Wound Assessment Details Patient Name: [name] Date of Service: 03/17/2018 1:00 PM Medical Record Number: [id number] Patient Account Number: [id number] Date of Birth/Sex: 1955/08/12 (62 y.o. M) Treating RN: Secundino Ginger Primary Care Christhoper Busbee: Park Liter Other Clinician: Referring Jacqualine Weichel: Park Liter Treating Tyaisha Cullom/Extender: [name] in Treatment: 2 Wound Status Wound Number: 2 Primary Arterial Insufficiency Ulcer Etiology: Wound Location: Right Toe Fourth Wound Open Wounding Event: Gradually Appeared Status: Date Acquired: 01/01/2018 Comorbid Arrhythmia, Hypertension, Peripheral Arterial Weeks Of Treatment: 2 History: Disease, Type II Diabetes, Dementia Clustered Wound: No Pending Amputation On Presentation Photos Photo Uploaded By: Secundino Ginger on 03/17/2018 13:11:55 Wound Measurements Length: (cm) 3.5 % Reduction Width: (cm) 5 % Reduction Depth: (cm) 0.1 Epitheliali Area: (cm) 13.744 Tunneling: Volume: (cm) 1.374 Underminin in Area: -16.7% in Volume: -16.6% zation: None No g: No Wound  Description Classification: Unclassifiable Foul Odor A Wound Margin: Indistinct, nonvisible Slough/Fibr Exudate Amount: None Present fter Cleansing: No ino No Wound Bed Granulation Amount: None Present (0%) Exposed Structure Necrotic Amount: Large (67-100%) Fascia Exposed: No Necrotic Quality: Eschar Fat Layer (Subcutaneous Tissue) Exposed: No Tendon Exposed: No Muscle Exposed: No Joint Exposed: No Bone Exposed: No Periwound Skin Texture Texture Color Barile, Richard B. ([id number]) No Abnormalities Noted: No No Abnormalities Noted: No Callus: No Atrophie Blanche: No Crepitus: No Cyanosis: No Excoriation: No Ecchymosis: Yes Induration: No Erythema: No Rash: No Hemosiderin Staining: No Scarring: No Mottled: Yes Pallor: No Moisture Rubor: No No Abnormalities Noted: No Dry / Scaly: No Temperature / Pain Maceration: No Temperature: No Abnormality Tenderness on Palpation: Yes Wound Preparation Ulcer Cleansing: Not Cleansed: dry eschar, Topical Anesthetic Applied: None, Other: lidocaine 4%, Electronic Signature(s) Signed: 03/17/2018 3:17:24 PM By: Secundino Ginger Entered By: Secundino Ginger on 03/17/2018 12:52:35 House, Louie Boston ([id number]) -------------------------------------------------------------------------------- Wound Assessment Details Patient Name: [name] Date of Service: 03/17/2018 1:00 PM Medical Record Number: [id number] Patient Account Number: [id number] Date of Birth/Sex: [birth year] (62 y.o. M) Treating RN: Secundino Ginger Primary Care Shaneque Merkle: Park Liter Other Clinician: Referring Julian Askin: Park Liter Treating Keidan Aumiller/Extender: [name] Weeks in Treatment: 2 Wound Status Wound Number: 3 Primary Arterial Insufficiency Ulcer Etiology: Wound Location: Right Toe Great Wound Open Wounding Event: Gradually Appeared Status: Date Acquired: 01/01/2018 Comorbid Arrhythmia, Hypertension, Peripheral Arterial Weeks Of Treatment: 2 History:  Disease, Type II Diabetes, Dementia Clustered Wound: No Pending Amputation On Presentation Photos Photo Uploaded By: Secundino Ginger on 03/17/2018 13:12:39 Wound Measurements Length: (cm) 0.8 Width: (cm) 0.9 Depth: (cm) 0.1 Area: (cm) 0.565 Volume: (cm) 0.057 % Reduction in Area: -99.6% % Reduction in Volume: -103.6% Epithelialization: None Tunneling: No Undermining: No Wound Description Classification: Partial Thickness Foul Odor A Wound Margin: Flat and Intact Slough/Fibr Exudate Amount: None Present fter Cleansing: No ino No Wound Bed Granulation Amount: None Present (0%) Exposed Structure Necrotic Amount: Medium (34-66%) Fascia Exposed: No Necrotic Quality: Adherent Slough Fat Layer (Subcutaneous Tissue) Exposed: No Tendon Exposed: No Muscle Exposed:  No Joint Exposed: No Bone Exposed: No Periwound Skin Texture Texture Color Macy, Christoher B. ([id number]) No Abnormalities Noted: No No Abnormalities Noted: No Callus: No Atrophie Blanche: No Crepitus: No Cyanosis: No Excoriation: No Ecchymosis: No Induration: No Erythema: No Rash: No Hemosiderin Staining: No Scarring: No Mottled: No Pallor: No Moisture Rubor: No No Abnormalities Noted: No Dry / Scaly: No Temperature / Pain Maceration: No Temperature: No Abnormality Tenderness on Palpation: Yes Wound Preparation Ulcer Cleansing: Rinsed/Irrigated with Saline Topical Anesthetic Applied: Other: lidocaine 4%, Treatment Notes Wound #3 (Right Toe Great) 1. Cleansed with: Clean wound with Normal Saline 2. Anesthetic Topical Lidocaine 4% cream to wound bed prior to debridement 3. Peri-wound Care: Other peri-wound care (specify in notes) Notes betadine 4th and 5th toes; santyl great toe Electronic Signature(s) Signed: 03/17/2018 3:17:24 PM By: Secundino Ginger Entered By: Secundino Ginger on 03/17/2018 12:55:34 Blanchet, Louie Boston  ([id number]) -------------------------------------------------------------------------------- Vitals Details Patient Name: [name] Date of Service: 03/17/2018 1:00 PM Medical Record Number: [id number] Patient Account Number: [id number] Date of Birth/Sex: October 29, 1955 (62 y.o. M) Treating RN: Secundino Ginger Primary Care September Mormile: Park Liter Other Clinician: Referring Anagha Loseke: Park Liter Treating Kiauna Zywicki/Extender: [name] in Treatment: 2 Vital Signs Time Taken: 12:43 Temperature (F): 98.3 Height (in): 70 Pulse (bpm): 86 Weight (lbs): 185 Respiratory Rate (breaths/min): 16 Body Mass Index (BMI): 26.5 Blood Pressure (mmHg): 147/67 Reference Range: 80 - 120 mg / dl Electronic Signature(s) Signed: 03/17/2018 3:17:24 PM By: Secundino Ginger Entered By: Secundino Ginger on 03/17/2018 12:43:30

## 2018-04-01 ENCOUNTER — Telehealth: Payer: Self-pay | Admitting: Family Medicine

## 2018-04-01 ENCOUNTER — Encounter: Payer: Self-pay | Admitting: Family Medicine

## 2018-04-01 DIAGNOSIS — D649 Anemia, unspecified: Secondary | ICD-10-CM

## 2018-04-01 LAB — COMPREHENSIVE METABOLIC PANEL
ALK PHOS: 88 IU/L (ref 39–117)
ALT: 16 IU/L (ref 0–44)
AST: 14 IU/L (ref 0–40)
Albumin/Globulin Ratio: 1.4 (ref 1.2–2.2)
Albumin: 4.2 g/dL (ref 3.6–4.8)
BUN/Creatinine Ratio: 24 (ref 10–24)
BUN: 16 mg/dL (ref 8–27)
Bilirubin Total: 0.4 mg/dL (ref 0.0–1.2)
CALCIUM: 9.4 mg/dL (ref 8.6–10.2)
CO2: 24 mmol/L (ref 20–29)
Chloride: 98 mmol/L (ref 96–106)
Creatinine, Ser: 0.68 mg/dL — ABNORMAL LOW (ref 0.76–1.27)
GFR calc Af Amer: 117 mL/min/{1.73_m2} (ref >59)
GFR, EST NON AFRICAN AMERICAN: 102 mL/min/{1.73_m2} (ref >59)
GLOBULIN, TOTAL: 3 g/dL (ref 1.5–4.5)
Glucose: 137 mg/dL — ABNORMAL HIGH (ref 65–99)
Potassium: 4.4 mmol/L (ref 3.5–5.2)
SODIUM: 137 mmol/L (ref 134–144)
Total Protein: 7.2 g/dL (ref 6.0–8.5)

## 2018-04-01 LAB — CBC WITH DIFFERENTIAL/PLATELET
Basophils Absolute: 0 10*3/uL (ref 0.0–0.2)
Basos: 0 %
EOS (ABSOLUTE): 0.3 10*3/uL (ref 0.0–0.4)
Eos: 3 %
HEMATOCRIT: 36 % — AB (ref 37.5–51.0)
Hemoglobin: 11.5 g/dL — ABNORMAL LOW (ref 13.0–17.7)
IMMATURE GRANULOCYTES: 0 %
Immature Grans (Abs): 0 10*3/uL (ref 0.0–0.1)
LYMPHS ABS: 1.8 10*3/uL (ref 0.7–3.1)
Lymphs: 19 %
MCH: 24.9 pg — ABNORMAL LOW (ref 26.6–33.0)
MCHC: 31.9 g/dL (ref 31.5–35.7)
MCV: 78 fL — ABNORMAL LOW (ref 79–97)
MONOS ABS: 0.6 10*3/uL (ref 0.1–0.9)
Monocytes: 6 %
Neutrophils Absolute: 6.9 10*3/uL (ref 1.4–7.0)
Neutrophils: 72 %
PLATELETS: 239 10*3/uL (ref 150–450)
RBC: 4.61 x10E6/uL (ref 4.14–5.80)
RDW: 15.3 % (ref 12.3–15.4)
WBC: 9.5 10*3/uL (ref 3.4–10.8)

## 2018-04-01 MED ORDER — FERROUS SULFATE 325 (65 FE) MG PO TABS: 325.0000 mg | ORAL_TABLET | Freq: Every day | ORAL | 3 refills | Status: DC

## 2018-04-01 NOTE — Telephone Encounter (Signed)
Please let him know that his labs came back good except he's a little anemic. I'm going to start him on iron pills (at his pharmacy) and I'd like him to come back next week (Thursday) for a recheck on his CBC before clearance.

## 2018-04-01 NOTE — Telephone Encounter (Signed)
Patients wife notified

## 2018-04-05 ENCOUNTER — Encounter: Payer: Self-pay | Admitting: Family Medicine

## 2018-04-05 DIAGNOSIS — I96 Gangrene, not elsewhere classified: Secondary | ICD-10-CM | POA: Diagnosis not present

## 2018-04-05 DIAGNOSIS — E1152 Type 2 diabetes mellitus with diabetic peripheral angiopathy with gangrene: Secondary | ICD-10-CM | POA: Diagnosis not present

## 2018-04-06 ENCOUNTER — Other Ambulatory Visit: Payer: Self-pay | Admitting: Podiatry

## 2018-04-06 ENCOUNTER — Encounter: Payer: Self-pay | Admitting: Physician Assistant

## 2018-04-06 ENCOUNTER — Ambulatory Visit: Payer: Medicare HMO | Admitting: Physician Assistant

## 2018-04-06 VITALS — BP 132/74 | HR 88 | Ht 70.0 in | Wt 193.0 lb

## 2018-04-06 DIAGNOSIS — D509 Iron deficiency anemia, unspecified: Secondary | ICD-10-CM | POA: Diagnosis not present

## 2018-04-06 DIAGNOSIS — Z01818 Encounter for other preprocedural examination: Secondary | ICD-10-CM

## 2018-04-06 DIAGNOSIS — I639 Cerebral infarction, unspecified: Secondary | ICD-10-CM | POA: Diagnosis not present

## 2018-04-06 DIAGNOSIS — E782 Mixed hyperlipidemia: Secondary | ICD-10-CM

## 2018-04-06 DIAGNOSIS — I10 Essential (primary) hypertension: Secondary | ICD-10-CM | POA: Diagnosis not present

## 2018-04-06 DIAGNOSIS — R600 Localized edema: Secondary | ICD-10-CM | POA: Diagnosis not present

## 2018-04-06 DIAGNOSIS — I739 Peripheral vascular disease, unspecified: Secondary | ICD-10-CM

## 2018-04-06 DIAGNOSIS — Z720 Tobacco use: Secondary | ICD-10-CM

## 2018-04-06 DIAGNOSIS — Z01810 Encounter for preprocedural cardiovascular examination: Secondary | ICD-10-CM

## 2018-04-06 DIAGNOSIS — I480 Paroxysmal atrial fibrillation: Secondary | ICD-10-CM | POA: Diagnosis not present

## 2018-04-06 MED ORDER — HYDROCHLOROTHIAZIDE 12.5 MG PO CAPS: 12.5000 mg | ORAL_CAPSULE | Freq: Every day | ORAL | 3 refills | Status: DC

## 2018-04-06 NOTE — Progress Notes (Signed)
Cardiology Office Note Date:  04/06/2018  Patient ID:  [name]., DOB [birth year], MRN [id number] PCP:  [name], DO  Cardiologist:  [name], MD    Chief Complaint: Pre-operative cardiac evaluation   History of Present Illness: [name]. is a 62 y.o. 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   Nuclear stress at [facility] in 2016 showed an EF of 58%, no significant ischemia. Echo at that time showed an EF of 55-60%, mild mitral regurgitation, trace tricuspid regurgitation, normal RVSF. Most recent echo from 06/2017, during admission for his most recent stroke, showed an EF of 55-65% with mild mitral regurgitation. He was started on Eliquis during his admission in 06/2017 for newly diagnosed Afib. When patient underwent lower extremity angiogram in 01/2018, he was noted to be back in Afib with RVR with ventricular rates into the 150s to 160s bpm. He was given IV metoprolol and converted to sinus rhythm.   He comes in accompanied by his wife today. He is doing reasonably well from a cardiac perspective. No symptoms concerning for angina  or dyspnea. He is able to achieve > 4 METs without issues. His main limiting factor in exertion is his foot/leg pain and balance. He has not had any falls since he was last seen. He denies any BRBPR, melena, hemoptysis, hematemesis, or hematuria. He does miss an occasional dose of Eliquis (typically the PM dose). He denies any palpitations, presyncope, or syncope. He continues to ambulate with a cane or walker given his dizziness. Over the past several weeks, he has noted a progressive worsening of bilateral lower extremity edema, right > left. He has been prescribed compression stockings by outside provider, though does not wear these. He does elevate his legs when sitting, as long as his foot pain will allow for this. He denies any orthopnea (stable 2-pillow), PND, abdominal distension, or early satiety. His weight is up 9 pounds today compared to his last office visit.    Past Medical History:  Diagnosis Date  . Diabetes mellitus with complication ([place])   . History of hernia repair   . Hyperlipidemia   . Hypertension   . Legally blind   . PAF (paroxysmal atrial fibrillation) ([name])    a. on Eliquis as of 2018; b. CHADS2VASc => 5 (HTN, DM, stroke x 2, vascular disease)  . Peripheral vascular disease ([place])    a. followed by Dr. Lucky Cowboy; b. s/p kissing balloon stents and right external iliac stent in 10/2017; c. LE angiogrpahy 01/2018: No significant arterial occlusive disease in the lower extremities.  . Stroke [facility])    a. 2016 & 2018    Past Surgical History:  Procedure Laterality Date  .  APPENDECTOMY    . HERNIA REPAIR     UMBILICAL  . LOWER EXTREMITY ANGIOGRAPHY Right 10/25/2017   Procedure: LOWER EXTREMITY ANGIOGRAPHY;  Surgeon: [name], MD;  Location: [place] CV LAB;  Service: Cardiovascular;  Laterality: Right;  . LOWER EXTREMITY ANGIOGRAPHY Right 01/13/2018   Procedure: LOWER EXTREMITY ANGIOGRAPHY;  Surgeon: [name], MD;  Location: [place] CV LAB;  Service:  Cardiovascular;  Laterality: Right;  . LOWER EXTREMITY INTERVENTION  10/25/2017   Procedure: LOWER EXTREMITY INTERVENTION;  Surgeon: [name], MD;  Location: [place] CV LAB;  Service: Cardiovascular;;  . TONSILLECTOMY      Current Meds  Medication Sig  . apixaban (ELIQUIS) 5 MG TABS tablet Take 1 tablet (5 mg total) by mouth 2 (two) times daily.  Marland Kitchen aspirin 81 MG EC tablet Take 1 tablet (81 mg total) by mouth daily.  . B Complex Vitamins (VITAMIN B COMPLEX PO) Take 1 tablet by mouth daily.   . Biotin (BIOTIN 5000) 5 MG CAPS Take 1 capsule by mouth daily.  . cholecalciferol (VITAMIN D) 1000 units tablet Take 1,000 Units by mouth daily.  Marland Kitchen diltiazem (CARDIZEM CD) 240 MG 24 hr capsule TAKE 1 CAPSULE BY MOUTH EVERY DAY  . empagliflozin (JARDIANCE) 10 MG TABS tablet Take 10 mg by mouth daily. (Patient taking differently: Take 5 mg daily by mouth. )  . ezetimibe-simvastatin (VYTORIN) 10-40 MG tablet Take 1 tablet by mouth daily at 6 PM.  . ferrous sulfate (FERROUSUL) 325 (65 FE) MG tablet Take 1 tablet (325 mg total) by mouth daily with breakfast.  . gabapentin (NEURONTIN) 400 MG capsule Take 1 capsule (400 mg total) by mouth 3 (three) times daily.  . metFORMIN (GLUCOPHAGE) 1000 MG tablet Take 1 tablet (1,000 mg total) by mouth 2 (two) times daily with a meal.  . Multiple Vitamin (MULTIVITAMIN WITH MINERALS) TABS tablet Take 1 tablet by mouth daily.  Marland Kitchen oxyCODONE-acetaminophen (PERCOCET) 10-325 MG tablet Take 1 tablet by mouth every 8 (eight) hours as needed for up to 13 days for pain.  . vitamin C (ASCORBIC ACID) 500 MG tablet Take 500 mg by mouth daily.  . Vitamins/Minerals TABS Take by mouth.    Allergies:   Sodium pentobarbital [pentobarbital]; Lipitor [atorvastatin]; and Gabapentin   Social History:  The patient  reports that he has quit smoking. His smoking use included cigarettes. He has a 7.50 pack-year smoking history. He has never used smokeless tobacco. He reports that he  drinks about 8.0 standard drinks of alcohol per week. He reports that he does not use drugs.   Family History:  The patient's family history includes Diabetes in his father; Hyperlipidemia in his father; Hypertension in his father.  ROS:   Review of Systems  Constitutional: Positive for malaise/fatigue. Negative for chills, diaphoresis, fever and weight loss.  HENT: Negative for congestion.   Eyes: Negative for discharge and redness.  Respiratory: Negative for cough, hemoptysis, sputum production, shortness of breath and wheezing.   Cardiovascular: Positive for leg swelling. Negative for chest pain, palpitations, orthopnea, claudication and PND.  Gastrointestinal: Negative for abdominal pain, blood in stool, heartburn, melena, nausea and vomiting.  Genitourinary: Negative for hematuria.  Musculoskeletal: Positive for back pain, joint pain and myalgias. Negative for falls.  Skin: Negative for rash.  Neurological: Positive for weakness. Negative for dizziness, tingling, tremors, sensory change, speech change, focal weakness and loss of consciousness.  Endo/Heme/Allergies: Does not bruise/bleed easily.  Psychiatric/Behavioral: Positive for memory loss. Negative for substance  abuse. The patient has insomnia. The patient is not nervous/anxious.   All other systems reviewed and are negative.    PHYSICAL EXAM:  VS:  BP 132/74 (BP Location: Right Arm, Patient Position: Sitting, Cuff Size: Normal)   Pulse 88   Ht 5\' 10"  (1.778 m)   Wt 193 lb (87.5 kg)   BMI 27.69 kg/m  BMI: Body mass index is 27.69 kg/m.  Physical Exam  Constitutional: He is oriented to person, place, and time. He appears well-developed and well-nourished.  HENT:  Head: Normocephalic and atraumatic.  Eyes: Right eye exhibits no discharge. Left eye exhibits no discharge.  Neck: Normal range of motion. No JVD present.  Cardiovascular: Normal rate, regular rhythm, S1 normal, S2 normal and normal heart sounds. Exam reveals no  distant heart sounds, no friction rub, no midsystolic click and no opening snap.  No murmur heard. Pulmonary/Chest: Effort normal and breath sounds normal. No respiratory distress. He has no decreased breath sounds. He has no wheezes. He has no rales. He exhibits no tenderness.  Abdominal: Soft. He exhibits distension. There is no tenderness.  Musculoskeletal: He exhibits edema.  2-3+ bilateral lower extremity edema, worse along the right lower leg with edema extending to the right buttock at 1+  Neurological: He is alert and oriented to person, place, and time.  Skin: Skin is warm and dry. No cyanosis. Nails show no clubbing.  Psychiatric: He has a normal mood and affect. His speech is normal and behavior is normal. Judgment and thought content normal.     EKG:  Was ordered and interpreted by me today. Shows NSR, 88 bpm, poor R wave progression through the precordial leads, no acute st/t changes   Recent Labs: 08/20/2017: TSH 2.370 03/31/2018: ALT 16; BUN 16; Creatinine, Ser 0.68; Hemoglobin 11.5; Platelets 239; Potassium 4.4; Sodium 137  06/22/2017: Total CHOL/HDL Ratio 2.8; VLDL 26 02/21/2018: Cholesterol, Total 141; HDL 58; LDL Calculated 54; Triglycerides 143   Estimated Creatinine Clearance: 97.6 mL/min (A) (by C-G formula based on SCr of 0.68 mg/dL (L)).   Wt Readings from Last 3 Encounters:  04/06/18 193 lb (87.5 kg)  03/31/18 189 lb 5 oz (85.9 kg)  03/22/18 184 lb 1.6 oz (83.5 kg)     Other studies reviewed: Additional studies/records reviewed today include: summarized above  ASSESSMENT AND PLAN:  1. Pre-operative cardiac evaluation: Per modified Lee Criteria, patient would be low risk from a cardiac perspective for non-cardiac surgery. However, given his progressive lower extremity swelling, I will plan to further risk stratify him with an echo to evaluate for a new cardiomyopathy. His surgery has been rescheduled from 9/11 to 9/13, in an effort to be completed in the hospital  rather than the outpatient surgical center. If patient's echo is unrevealing, he will be moderate risk for non-cardiac surgery. He will need to remain on Eliquis until 1 day prior to his surgery as detailed below.   2. PAF: Currently in sinus rhythm with a heart rate in the 80s bpm. Given his recurrent strokes, we will need to minimize his time off full-dose anticoagulation. Patient is to stop Eliquis 1 day prior to his planned surgery and resume anticoagulation (either Eliquis, full-dose Lovenox, or heparin as soon as possible post-operatively). This recommendation is following current per-operative guidelines. Clinical pharmacist agrees with this recommendation. Continue Cardizem CD for rate control, though may want to consider beta blocker given his lower extremity edema. With regards to his intermittent missed Eliquis dosages, we could consider changing him to  Xarelto, for once-daily dosing, following his surgery.   3. Recurrent strokes: Has residual deficits as outlined above. Has been managed on both Eliquis and ASA 81 mg. PCP has referred patient back to neurology for concerns of memory loss. LDL at goal as below. Aggressive secondary prevention including lifestyle modification and optimization of diabetes control. As above, with the patient being off anticoagulation in the peri-operative time period, he will be at increased risk for stroke. We have minimized his time off Eliquis as above, per current peri-operative guidelines, however we are not able to completely take away his risk of stroke. This was explained to the patient and his wife in detail today. They understand this and accept the risk.   4. 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 Scheduled for amputation of the right 4th and 5th toes on 04/13/18. LDL at goal as below. Recommend optimal diabetes and BP control.   5. Lower extremity edema: Schedule echo (04/07/2018) to exclude  cardiomyopathy. Check CMET, TSH, and CBC. Has previously been advised to wear compression stockings, though refuses to do so. He does elevate his leg when sitting, as long as his foot pain will allow for this. Advised patient to start with ACE wraps and progress to compression stockings (detailed instructions were given for compression stockings). Elevate legs. Start HCTZ as below. Likely multifactorial including chronic venous insufficiency, PVD, and calcium channel blocker-induced. Less likely DVT given he is on Eliquis.   6. DM2: Per PCP. Last A1c 7.6 from 06/2017.   7. Microcytic anemia: Baseline HGB ~ 14-15. HGB from 01/2018 down to 12.8 with recent check on 8/29 down to 11.5 with an MCV of 78. Has been started on iron by PCP and they are planning on recheck CBC prior to his surgery. Followed by PCP.    8. HTN: Blood pressure mildly elevated today. Add HCTZ 12.5 mg daily. Check CMET today as above with scheduled BMET in 1 week. Continue Cardizem CD 240 mg daily for now. However, we may want to consider changing his Cardizem to an alternative antihypertensive as this may be playing a role in some of his lower extremity edema.   9. HLD: LDL from 01/2018 of 54. LFT normal from 03/2018. On Vytorin.  10. Tobacco abuse: He was congratulated for cessation as of 01/20/2018.   Disposition: F/u with [name] in 6 months.   Current medicines are reviewed at length with the patient today.  The patient did not have any concerns regarding medicines.  Signed, Christell Faith, PA-C 04/06/2018 2:27 PM     [address] [place], [place] [address] [phone]

## 2018-04-06 NOTE — Patient Instructions (Signed)
Medication Instructions: START Hydrochlorothiazide 12.5 mg daily  If you need a refill on your cardiac medications before your next appointment, please call your pharmacy.   Labwork: Your provider would like for you to have the following labs today: CMET, CBC and TSH  Your provider would like for you to return in one week to have the following labs drawn: BMET. Please go to the [facility] entrance and check in at the front desk. You do not need an appointment.   Procedures/Testing: Your physician has requested that you have an echocardiogram tomorrow. Echocardiography is a painless test that uses sound waves to create images of your heart. It provides your doctor with information about the size and shape of your heart and how well your heart's chambers and valves are working. You may receive an ultrasound enhancing agent through an IV if needed to better visualize your heart during the echo.This procedure takes approximately one hour. There are no restrictions for this procedure. This will take place at the [facility] clinic.    Follow-Up: Your physician wants you to follow-up in 6 months with [name]. You will receive a reminder letter in the mail two months in advance. If you don't receive a letter, please call our office at [phone] to schedule this follow-up appointment.   Special Instructions: HOLD ELIQUIS ONE DAY PRIOR TO THE PROCEDURE. RESUME 24 HOURS AFTER IF HEMOSTATIS HAS BEEN OBTAINED  You may wear compression stockings during the day.   Thank you for choosing [facility] at [facility]!

## 2018-04-07 ENCOUNTER — Ambulatory Visit (INDEPENDENT_AMBULATORY_CARE_PROVIDER_SITE_OTHER): Payer: Medicare HMO | Admitting: Physician Assistant

## 2018-04-07 ENCOUNTER — Encounter: Payer: Self-pay | Admitting: Physician Assistant

## 2018-04-07 ENCOUNTER — Other Ambulatory Visit: Payer: Self-pay

## 2018-04-07 ENCOUNTER — Ambulatory Visit (INDEPENDENT_AMBULATORY_CARE_PROVIDER_SITE_OTHER): Payer: Medicare HMO

## 2018-04-07 VITALS — BP 131/78 | HR 94 | Temp 98.4°F | Ht 70.0 in | Wt 190.2 lb

## 2018-04-07 DIAGNOSIS — Z01810 Encounter for preprocedural cardiovascular examination: Secondary | ICD-10-CM

## 2018-04-07 DIAGNOSIS — D649 Anemia, unspecified: Secondary | ICD-10-CM | POA: Diagnosis not present

## 2018-04-07 DIAGNOSIS — R195 Other fecal abnormalities: Secondary | ICD-10-CM | POA: Diagnosis not present

## 2018-04-07 DIAGNOSIS — I480 Paroxysmal atrial fibrillation: Secondary | ICD-10-CM | POA: Diagnosis not present

## 2018-04-07 LAB — COMPREHENSIVE METABOLIC PANEL
ALBUMIN: 4.3 g/dL (ref 3.6–4.8)
ALT: 19 IU/L (ref 0–44)
AST: 28 IU/L (ref 0–40)
Albumin/Globulin Ratio: 1.5 (ref 1.2–2.2)
Alkaline Phosphatase: 80 IU/L (ref 39–117)
BUN/Creatinine Ratio: 15 (ref 10–24)
BUN: 11 mg/dL (ref 8–27)
Bilirubin Total: 0.4 mg/dL (ref 0.0–1.2)
CHLORIDE: 101 mmol/L (ref 96–106)
CO2: 22 mmol/L (ref 20–29)
Calcium: 9.5 mg/dL (ref 8.6–10.2)
Creatinine, Ser: 0.71 mg/dL — ABNORMAL LOW (ref 0.76–1.27)
GFR calc non Af Amer: 100 mL/min/{1.73_m2} (ref >59)
GFR, EST AFRICAN AMERICAN: 115 mL/min/{1.73_m2} (ref >59)
GLOBULIN, TOTAL: 2.9 g/dL (ref 1.5–4.5)
GLUCOSE: 125 mg/dL — AB (ref 65–99)
POTASSIUM: 4.3 mmol/L (ref 3.5–5.2)
Sodium: 141 mmol/L (ref 134–144)
TOTAL PROTEIN: 7.2 g/dL (ref 6.0–8.5)

## 2018-04-07 LAB — CBC
HEMATOCRIT: 34.8 % — AB (ref 37.5–51.0)
HEMOGLOBIN: 10.8 g/dL — AB (ref 13.0–17.7)
MCH: 23.8 pg — ABNORMAL LOW (ref 26.6–33.0)
MCHC: 31 g/dL — ABNORMAL LOW (ref 31.5–35.7)
MCV: 77 fL — ABNORMAL LOW (ref 79–97)
Platelets: 259 10*3/uL (ref 150–450)
RBC: 4.53 x10E6/uL (ref 4.14–5.80)
RDW: 14.4 % (ref 12.3–15.4)
WBC: 8.4 10*3/uL (ref 3.4–10.8)

## 2018-04-07 LAB — POC HEMOCCULT BLD/STL (OFFICE/1-CARD/DIAGNOSTIC): Fecal Occult Blood, POC: POSITIVE — AB

## 2018-04-07 LAB — TSH: TSH: 2.48 u[IU]/mL (ref 0.450–4.500)

## 2018-04-07 NOTE — Patient Instructions (Signed)
Anemia Anemia is a condition in which you do not have enough red blood cells or hemoglobin. Hemoglobin is a substance in red blood cells that carries oxygen. When you do not have enough red blood cells or hemoglobin (are anemic), your body cannot get enough oxygen and your organs may not work properly. As a result, you may feel very tired or have other problems. What are the causes? Common causes of anemia include:  Excessive bleeding. Anemia can be caused by excessive bleeding inside or outside the body, including bleeding from the intestine or from periods in women.  Poor nutrition.  Long-lasting (chronic) kidney, thyroid, and liver disease.  Bone marrow disorders.  Cancer and treatments for cancer.  HIV (human immunodeficiency virus) and AIDS (acquired immunodeficiency syndrome).  Treatments for HIV and AIDS.  Spleen problems.  Blood disorders.  Infections, medicines, and autoimmune disorders that destroy red blood cells.  What are the signs or symptoms? Symptoms of this condition include:  Minor weakness.  Dizziness.  Headache.  Feeling heartbeats that are irregular or faster than normal (palpitations).  Shortness of breath, especially with exercise.  Paleness.  Cold sensitivity.  Indigestion.  Nausea.  Difficulty sleeping.  Difficulty concentrating.  Symptoms may occur suddenly or develop slowly. If your anemia is mild, you may not have symptoms. How is this diagnosed? This condition is diagnosed based on:  Blood tests.  Your medical history.  A physical exam.  Bone marrow biopsy.  Your health care provider may also check your stool (feces) for blood and may do additional testing to look for the cause of your bleeding. You may also have other tests, including:  Imaging tests, such as a CT scan or MRI.  Endoscopy.  Colonoscopy.  How is this treated? Treatment for this condition depends on the cause. If you continue to lose a lot of blood,  you may need to be treated at a hospital. Treatment may include:  Taking supplements of iron, vitamin B12, or folic acid.  Taking a hormone medicine (erythropoietin) that can help to stimulate red blood cell growth.  Having a blood transfusion. This may be needed if you lose a lot of blood.  Making changes to your diet.  Having surgery to remove your spleen.  Follow these instructions at home:  Take over-the-counter and prescription medicines only as told by your health care provider.  Take supplements only as told by your health care provider.  Follow any diet instructions that you were given.  Keep all follow-up visits as told by your health care provider. This is important. Contact a health care provider if:  You develop new bleeding anywhere in the body. Get help right away if:  You are very weak.  You are short of breath.  You have pain in your abdomen or chest.  You are dizzy or feel faint.  You have trouble concentrating.  You have bloody or black, tarry stools.  You vomit repeatedly or you vomit up blood. Summary  Anemia is a condition in which you do not have enough red blood cells or enough of a substance in your red blood cells that carries oxygen (hemoglobin).  Symptoms may occur suddenly or develop slowly.  If your anemia is mild, you may not have symptoms.  This condition is diagnosed with blood tests as well as a medical history and physical exam. Other tests may be needed.  Treatment for this condition depends on the cause of the anemia. This information is not intended to replace advice   given to you by your health care provider. Make sure you discuss any questions you have with your health care provider. Document Released: 08/27/2004 Document Revised: 08/21/2016 Document Reviewed: 08/21/2016 Elsevier Interactive Patient Education  [employer].

## 2018-04-07 NOTE — Progress Notes (Signed)
Subjective:    Patient ID: Glenn Gerold., male    DOB: Feb 16, 1956, 62 y.o.   MRN: [id number]  Glenn Alcalde. is a 62 y.o. male presenting on 04/07/2018 for Labs Only (pt states his blood count has been low)   HPI   Patient presents today with anemia. Seven months ago his hemoglobin was 14 and one day ago it was 10.8. His MCV is 77 and hematocrit is 34.8%. He denies visible blood in stool or urine. He denies hematemesis. He was scheduled to have right 4th and 5th toe amputation 04/15/2018 with Dr. Vickki Muff. He denies fatigue and dizziness.    CBC Latest Ref Rng & Units 04/06/2018 03/31/2018 02/21/2018  WBC 3.4 - 10.8 x10E3/uL 8.4 9.5 10.1  Hemoglobin 13.0 - 17.7 g/dL 10.8(L) 11.5(L) 12.8(L)  Hematocrit 37.5 - 51.0 % 34.8(L) 36.0(L) 39.3  Platelets 150 - 450 x10E3/uL 259 239 278    Wt Readings from Last 3 Encounters:  04/07/18 190 lb 3.2 oz (86.3 kg)  04/06/18 193 lb (87.5 kg)  03/31/18 189 lb 5 oz (85.9 kg)      Social History   Tobacco Use  . Smoking status: Former Smoker    Packs/day: 0.25    Years: 30.00    Pack years: 7.50    Types: Cigarettes  . Smokeless tobacco: Never Used  Substance Use Topics  . Alcohol use: Yes    Alcohol/week: 8.0 standard drinks    Types: 8 Cans of beer per week    Comment: occasionally  . Drug use: No    Review of Systems Per HPI unless specifically indicated above     Objective:    BP 131/78 (BP Location: Left Arm, Cuff Size: Normal)   Pulse 94   Temp 98.4 F (36.9 C) (Oral)   Ht 5\' 10"  (1.778 m)   Wt 190 lb 3.2 oz (86.3 kg)   SpO2 95%   BMI 27.29 kg/m   Wt Readings from Last 3 Encounters:  04/07/18 190 lb 3.2 oz (86.3 kg)  04/06/18 193 lb (87.5 kg)  03/31/18 189 lb 5 oz (85.9 kg)    Physical Exam  Constitutional: He is oriented to person, place, and time. He appears well-developed and well-nourished.  Cardiovascular: Normal rate.  Pulmonary/Chest: Effort normal.  Genitourinary: Rectal exam shows guaiac positive  stool. Rectal exam shows no external hemorrhoid and no fissure.  Neurological: He is alert and oriented to person, place, and time.  Skin: Skin is warm and dry.  Psychiatric: He has a normal mood and affect. His behavior is normal.   Results for orders placed or performed in visit on 04/07/18  POC Hemoccult Bld/Stl (1-Cd Office Dx)  Result Value Ref Range   Card #1 Date     Fecal Occult Blood, POC Positive (A) Negative      Assessment & Plan:  1. Anemia, unspecified type  Labs as below today. Hemoccult was positive in office today. Patient has never had colonoscopy or any form of colon cancer screening in his life. We will refer him to GI for workup of this, concerning for malignancy. He will continue on ferrous sulfate 325 mg TID per Dr. Wynetta Emery. His surgery will be denied due to this new issue, forwarding to Dr. Wynetta Emery and appropriate forms will be faxed to Dr. Vickki Muff.  - CBC with Differential - Iron, TIBC and Ferritin Panel - Ambulatory referral to Gastroenterology - POC Hemoccult Bld/Stl (1-Cd Office Dx)  2. Occult blood positive stool  -  Ambulatory referral to Gastroenterology   Follow up plan: Return if symptoms worsen or fail to improve.  Carles Collet, PA-C [facility] 04/07/2018, 4:14 PM

## 2018-04-08 ENCOUNTER — Telehealth: Payer: Self-pay | Admitting: Family Medicine

## 2018-04-08 ENCOUNTER — Other Ambulatory Visit: Payer: Self-pay | Admitting: *Deleted

## 2018-04-08 ENCOUNTER — Telehealth: Payer: Self-pay | Admitting: Physician Assistant

## 2018-04-08 DIAGNOSIS — I77810 Thoracic aortic ectasia: Secondary | ICD-10-CM

## 2018-04-08 LAB — CBC WITH DIFFERENTIAL/PLATELET
Basophils Absolute: 0 10*3/uL (ref 0.0–0.2)
Basos: 0 %
EOS (ABSOLUTE): 0.3 10*3/uL (ref 0.0–0.4)
Eos: 2 %
Hematocrit: 34.9 % — ABNORMAL LOW (ref 37.5–51.0)
Hemoglobin: 11.3 g/dL — ABNORMAL LOW (ref 13.0–17.7)
Immature Grans (Abs): 0.1 10*3/uL (ref 0.0–0.1)
Immature Granulocytes: 1 %
Lymphocytes Absolute: 1.4 10*3/uL (ref 0.7–3.1)
Lymphs: 13 %
MCH: 24.6 pg — ABNORMAL LOW (ref 26.6–33.0)
MCHC: 32.4 g/dL (ref 31.5–35.7)
MCV: 76 fL — ABNORMAL LOW (ref 79–97)
Monocytes Absolute: 0.8 10*3/uL (ref 0.1–0.9)
Monocytes: 7 %
Neutrophils Absolute: 8.5 10*3/uL — ABNORMAL HIGH (ref 1.4–7.0)
Neutrophils: 77 %
Platelets: 241 10*3/uL (ref 150–450)
RBC: 4.59 x10E6/uL (ref 4.14–5.80)
RDW: 14.3 % (ref 12.3–15.4)
WBC: 11.1 10*3/uL — ABNORMAL HIGH (ref 3.4–10.8)

## 2018-04-08 LAB — IRON,TIBC AND FERRITIN PANEL
Ferritin: 13 ng/mL — ABNORMAL LOW (ref 30–400)
Iron Saturation: 28 % (ref 15–55)
Iron: 138 ug/dL (ref 38–169)
Total Iron Binding Capacity: 500 ug/dL — ABNORMAL HIGH (ref 250–450)
UIBC: 362 ug/dL — ABNORMAL HIGH (ref 111–343)

## 2018-04-08 MED ORDER — SULFAMETHOXAZOLE-TRIMETHOPRIM 800-160 MG PO TABS: 1.0000 | ORAL_TABLET | Freq: Two times a day (BID) | ORAL | 0 refills | Status: DC

## 2018-04-08 NOTE — Telephone Encounter (Signed)
Patient notified of results.

## 2018-04-08 NOTE — Telephone Encounter (Signed)
Called and s/w patient's wife, ok per DPR. She verbalized understanding of patient's echo results and plan of care.  Scheduled patient for CTA aorta on 04/18/18, arrival time of 7:45 am at [place]. Patient is not to have any food within 4 hours prior to test, but may have clear liquids within 4 hours prior.   Patient's wife verbalized understanding of instructions, arrival time, date and location.

## 2018-04-08 NOTE — Telephone Encounter (Signed)
Copied from [place] [id number]. Topic: General - Other >> Apr 08, 2018  9:27 AM Yvette Rack wrote: Reason for CRM: pt wife calling for lab results

## 2018-04-08 NOTE — Telephone Encounter (Signed)
Patient calling to discuss recent  Echo testing results   Please call   

## 2018-04-11 ENCOUNTER — Other Ambulatory Visit: Payer: Self-pay

## 2018-04-11 ENCOUNTER — Encounter
Admission: RE | Admit: 2018-04-11 | Discharge: 2018-04-11 | Disposition: A | Discharge status: Home / Self Care | Payer: Medicare HMO | Source: Ambulatory Visit | Attending: Podiatry | Admitting: Podiatry

## 2018-04-11 ENCOUNTER — Telehealth: Payer: Self-pay | Admitting: Physician Assistant

## 2018-04-11 ENCOUNTER — Telehealth: Payer: Self-pay | Admitting: Family Medicine

## 2018-04-11 DIAGNOSIS — I480 Paroxysmal atrial fibrillation: Secondary | ICD-10-CM | POA: Diagnosis not present

## 2018-04-11 HISTORY — DX: Anemia, unspecified: D64.9

## 2018-04-11 MED ORDER — METOPROLOL TARTRATE 25 MG PO TABS: 25.0000 mg | ORAL_TABLET | Freq: Two times a day (BID) | ORAL | 3 refills | Status: DC

## 2018-04-11 NOTE — Telephone Encounter (Signed)
No answer. Left message to call back.   

## 2018-04-11 NOTE — Telephone Encounter (Signed)
Copied from [place] [id number]. Topic: Medical Record Request - Other >> Apr 11, 2018  1:59 PM Hewitt Shorts wrote: Pt is requesting that the labs and a reason as to why the surgery should be postponed sent to Dr. Vickki Muff   [phone]   Best number [phone]

## 2018-04-11 NOTE — Telephone Encounter (Signed)
Called wife, ok per DPR. Patient still has swelling in the right leg but it has gone down since starting the HCTZ.  Says patient has blisters are on his middle toe on right foot and shin of right leg.   Discussed with Christell Faith PA-C. He advised that ok to stop diltiazem, start metoprolol tartrate 25 mg by mouth two times a day. If swelling or blisters do not improve, patient should contact Dr Lucky Cowboy or Dr Vickki Muff regarding this.  Wife verbalized understanding to stop diltiazem, start metoprolol two times a day and to call foot doctors if symptoms do not improve or worsen. Rx sent to pharmacy and med list updated.

## 2018-04-11 NOTE — Telephone Encounter (Signed)
Notes and labs faxed per patient request. Called and let them know that this was done for them.

## 2018-04-11 NOTE — Patient Instructions (Signed)
Your procedure is scheduled on: Friday 04/15/18 Report to [place]. To find out your arrival time please call [phone] between 1PM - 3PM on Thursday 04/14/18.  Remember: Instructions that are not followed completely may result in serious medical risk, up to and including death, or upon the discretion of your surgeon and anesthesiologist your surgery may need to be rescheduled.     _X__ 1. Do not eat food after midnight the night before your procedure.                 No gum chewing or hard candies. You may drink clear liquids up to 2 hours                 before you are scheduled to arrive for your surgery- DO not drink clear                 liquids within 2 hours of the start of your surgery.                 Clear Liquids include:  water, apple juice without pulp, clear carbohydrate                 drink such as Clearfast or Gatorade, Black Coffee or Tea (Do not add                 anything to coffee or tea).  __X__2.  On the morning of surgery brush your teeth with toothpaste and water, you                 may rinse your mouth with mouthwash if you wish.  Do not swallow any              toothpaste of mouthwash.     _X__ 3.  No Alcohol for 24 hours before or after surgery.   _X__ 4.  Do Not Smoke or use e-cigarettes For 24 Hours Prior to Your Surgery.                 Do not use any chewable tobacco products for at least 6 hours prior to                 surgery.  ____  5.  Bring all medications with you on the day of surgery if instructed.   __X__  6.  Notify your doctor if there is any change in your medical condition      (cold, fever, infections).     Do not wear jewelry, make-up, hairpins, clips or nail polish. Do not wear lotions, powders, or perfumes.  Do not shave 48 hours prior to surgery. Men may shave face and neck. Do not bring valuables to the hospital.    [facility] is not responsible for any belongings or  valuables.  Contacts, dentures/partials or body piercings may not be worn into surgery. Bring a case for your contacts, glasses or hearing aids, a denture cup will be supplied. Leave your suitcase in the car. After surgery it may be brought to your room. For patients admitted to the hospital, discharge time is determined by your treatment team.   Patients discharged the day of surgery will not be allowed to drive home.   Please read over the following fact sheets that you were given:   MRSA Information  __X__ Take these medicines the morning of surgery with A SIP OF WATER:  1. Diltiazem  2. Gabapentin  3. Oxycodone if needed  4.  5.  6.  ____ Fleet Enema (as directed)   __X__ Use CHG Soap/SAGE wipes as directed  ____ Use inhalers on the day of surgery  __X__ Stop metformin/Janumet/Farxiga 2 days prior to surgery    ____ Take 1/2 of usual insulin dose the night before surgery. No insulin the morning          of surgery.   __X__ Stop Blood Thinners Coumadin/Plavix/Xarelto/Pleta/Pradaxa/Eliquis/Effient/Aspirin  on   Or contact your Surgeon, Cardiologist or Medical Doctor regarding  ability to stop your blood thinners  __X__ Stop Anti-inflammatories 7 days before surgery such as Advil, Ibuprofen, Motrin,  BC or Goodies Powder, Naprosyn, Naproxen, Aleve, Aspirin    __X__ Stop all herbal supplements, fish oil or vitamin E until after surgery.    ____ Bring C-Pap to the hospital.

## 2018-04-11 NOTE — Telephone Encounter (Signed)
Patient is anemic with active rectal bleeding. Notes were faxed last week. Please fax again and call to let them know the above.

## 2018-04-11 NOTE — Telephone Encounter (Signed)
Patient returning call see note below

## 2018-04-11 NOTE — Telephone Encounter (Signed)
Patient wife calling asking about the results they received on patient lab work She states in the results it is mentioned we could put patient on verapamil to a beta blocker to assist with some of his lower extremity swelling.  She states his swelling has gone down but states there is some blisters going on   Would like advise on this

## 2018-04-11 NOTE — Telephone Encounter (Signed)
Patient's wife Neoma Laming returning call

## 2018-04-11 NOTE — Addendum Note (Signed)
Addended by: Vanessa Ralphs on: 04/11/2018 04:23 PM   Modules accepted: Orders

## 2018-04-12 NOTE — Telephone Encounter (Signed)
refaxed notes

## 2018-04-12 NOTE — Telephone Encounter (Signed)
Pt wanted to make office aware that Dr. Alvera Singh office didn't receive fax, they would like to know if provider/assistant could refax?

## 2018-04-13 ENCOUNTER — Encounter: Payer: Self-pay | Admitting: Family Medicine

## 2018-04-13 ENCOUNTER — Other Ambulatory Visit: Payer: Self-pay

## 2018-04-13 ENCOUNTER — Encounter: Payer: Self-pay | Admitting: Gastroenterology

## 2018-04-13 ENCOUNTER — Ambulatory Visit: Payer: Medicare HMO | Admitting: Gastroenterology

## 2018-04-13 ENCOUNTER — Ambulatory Visit (INDEPENDENT_AMBULATORY_CARE_PROVIDER_SITE_OTHER): Payer: Medicare HMO | Admitting: Family Medicine

## 2018-04-13 VITALS — BP 108/69 | HR 69 | Ht 70.0 in | Wt 191.2 lb

## 2018-04-13 VITALS — BP 133/73 | HR 86 | Temp 98.2°F | Ht 70.0 in | Wt 190.5 lb

## 2018-04-13 DIAGNOSIS — I96 Gangrene, not elsewhere classified: Secondary | ICD-10-CM

## 2018-04-13 DIAGNOSIS — I739 Peripheral vascular disease, unspecified: Secondary | ICD-10-CM

## 2018-04-13 DIAGNOSIS — D509 Iron deficiency anemia, unspecified: Secondary | ICD-10-CM

## 2018-04-13 MED ORDER — SULFAMETHOXAZOLE-TRIMETHOPRIM 800-160 MG PO TABS: 1.0000 | ORAL_TABLET | Freq: Two times a day (BID) | ORAL | 0 refills | Status: DC

## 2018-04-13 MED ORDER — OXYCODONE-ACETAMINOPHEN 10-325 MG PO TABS: 1.0000 | ORAL_TABLET | Freq: Three times a day (TID) | ORAL | 0 refills | Status: DC | PRN

## 2018-04-13 NOTE — Progress Notes (Signed)
BP 133/73   Pulse 86   Temp 98.2 F (36.8 C) (Oral)   Ht 5\' 10"  (1.778 m)   Wt 190 lb 8 oz (86.4 kg)   SpO2 97%   BMI 27.33 kg/m    Subjective:    Patient ID: [name], male    DOB: [birth year], 62 y.o.   MRN: [id number]  HPI: [name]. is a 62 y.o. male  Chief Complaint  Patient presents with  . Foot Pain    right foot/ med refill percocet   Here today to discuss pain management for multiple necrotic toes on right foot that he's awaiting amputation for with Podiatry. Surgery has been placed on hold due to new anemia and GI bleeding that is being worked up as well as cardiac clearance for surgery. Currently taking percocet TID with fairly tolerable relief. Taking bactrim to keep infection under control while awaiting surgery. No fevers, sweats, chills.   Relevant past medical, surgical, family and social history reviewed and updated as indicated. Interim medical history since our last visit reviewed. Allergies and medications reviewed and updated.  Review of Systems  Per HPI unless specifically indicated above     Objective:    BP 133/73   Pulse 86   Temp 98.2 F (36.8 C) (Oral)   Ht 5\' 10"  (1.778 m)   Wt 190 lb 8 oz (86.4 kg)   SpO2 97%   BMI 27.33 kg/m   Wt Readings from Last 3 Encounters:  04/13/18 191 lb 3.2 oz (86.7 kg)  04/13/18 190 lb 8 oz (86.4 kg)  04/11/18 188 lb (85.3 kg)    Physical Exam  Constitutional: He appears well-developed and well-nourished.  HENT:  Head: Atraumatic.  Eyes: Conjunctivae and EOM are normal.  Neck: Neck supple.  Cardiovascular: Normal rate.  Pulmonary/Chest: Effort normal and breath sounds normal.  Musculoskeletal:  Antalgic ROM due to necrotic toes R foot  Neurological: He is alert.  Skin: Skin is warm. There is erythema (right foot).  4th and 5th digit left foot completely necrotic, black Remaining foot erythematous, inflamed  Nursing note and vitals reviewed.   Results for orders placed or  performed in visit on 04/07/18  CBC with Differential  Result Value Ref Range   WBC 11.1 (H) 3.4 - 10.8 x10E3/uL   RBC 4.59 4.14 - 5.80 x10E6/uL   Hemoglobin 11.3 (L) 13.0 - 17.7 g/dL   Hematocrit 34.9 (L) 37.5 - 51.0 %   MCV 76 (L) 79 - 97 fL   MCH 24.6 (L) 26.6 - 33.0 pg   MCHC 32.4 31.5 - 35.7 g/dL   RDW 14.3 12.3 - 15.4 %   Platelets 241 150 - 450 x10E3/uL   Neutrophils 77 Not Estab. %   Lymphs 13 Not Estab. %   Monocytes 7 Not Estab. %   Eos 2 Not Estab. %   Basos 0 Not Estab. %   Neutrophils Absolute 8.5 (H) 1.4 - 7.0 x10E3/uL   Lymphocytes Absolute 1.4 0.7 - 3.1 x10E3/uL   Monocytes Absolute 0.8 0.1 - 0.9 x10E3/uL   EOS (ABSOLUTE) 0.3 0.0 - 0.4 x10E3/uL   Basophils Absolute 0.0 0.0 - 0.2 x10E3/uL   Immature Granulocytes 1 Not Estab. %   Immature Grans (Abs) 0.1 0.0 - 0.1 x10E3/uL  Iron, TIBC and Ferritin Panel  Result Value Ref Range   Total Iron Binding Capacity 500 (H) 250 - 450 ug/dL   UIBC 362 (H) 111 - 343 ug/dL   Iron  138 38 - 169 ug/dL   Iron Saturation 28 15 - 55 %   Ferritin 13 (L) 30 - 400 ng/mL  POC Hemoccult Bld/Stl (1-Cd Office Dx)  Result Value Ref Range   Card #1 Date     Fecal Occult Blood, POC Positive (A) Negative      Assessment & Plan:   Problem List Items Addressed This Visit      Cardiovascular and Mediastinum   PVD (peripheral vascular disease) ([place])    Followed by vascular. Stopped smoking recently. Awaiting amputation of right 4th and 5th toes with podiatry        Other   Necrotic toes ([place]) - Primary    Will continue bactrim and provide 1 month supply of percocet. Re-eval at that time depending on surgical date. Sedation and addiction precautions reviewed       Controlled substance database reviewed and appropriate.    Follow up plan: Return in about 4 weeks (around 05/11/2018) for Foot pain.

## 2018-04-13 NOTE — Progress Notes (Signed)
[name] MD, MRCP(U.K) [address]  [place]  [place], [place] [address]  Main: [phone]  Fax: [phone]   Gastroenterology Consultation  Referring Provider:     Paulene Floor Primary Care Physician:  [name], DO Primary Gastroenterologist:  Dr. [name]  Reason for Consultation:     Iron deficiency anemia         HPI:   [name]. is a 62 y.o. y/o male referred for consultation & management  by Dr. Wynetta Emery, Megan P, DO.    He has been referred for anemia.  Looking back at his labs what appeared of the 1 month hemoglobin is dropped from 12.8 g to 11.3 g.  MCV is low at 76.  His hemoglobin was normal 7 months back at 14 g with an MCV of 89.  Iron studies performed on 04/07/2018 demonstrates a ferritin of 13.  Fecal occult blood testing is positive.  He is on Eliquis.  Iron/TIBC/Ferritin/ %Sat    Component Value Date/Time   IRON 138 04/07/2018 1613   TIBC 500 (H) 04/07/2018 1613   FERRITIN 13 (L) 04/07/2018 1613   IRONPCTSAT 28 04/07/2018 1613     CBC Latest Ref Rng & Units 04/07/2018 04/06/2018 03/31/2018  WBC 3.4 - 10.8 x10E3/uL 11.1(H) 8.4 9.5  Hemoglobin 13.0 - 17.7 g/dL 11.3(L) 10.8(L) 11.5(L)  Hematocrit 37.5 - 51.0 % 34.9(L) 34.8(L) 36.0(L)  Platelets 150 - 450 x10E3/uL 241 259 239   Rectal bleeding: no  Nose bleeds: no  Hematemesis or hemoptysis : no  Blood in urine : no  NSAID's: no  No GI surgeries.  Never had a colonoscopy . No known family history of colon cancer or polyps. No change in bowel habits. Normal apetite. On eloquis.    Past Medical History:  Diagnosis Date  . Anemia   . Diabetes mellitus with complication ([place])   . History of hernia repair   . Hyperlipidemia   . Hypertension   . Legally blind   . PAF (paroxysmal atrial fibrillation) ([name])    a. on Eliquis as of 2018; b. CHADS2VASc => 5 (HTN, DM, stroke x 2, vascular disease)  . Peripheral vascular disease ([place])    a. followed by Dr. Lucky Cowboy; b. s/p  kissing balloon stents and right external iliac stent in 10/2017; c. LE angiogrpahy 01/2018: No significant arterial occlusive disease in the lower extremities.  . Stroke [facility]    a. 2016 & 2018    Past Surgical History:  Procedure Laterality Date  . APPENDECTOMY    . HERNIA REPAIR     UMBILICAL  . LOWER EXTREMITY ANGIOGRAPHY Right 10/25/2017   Procedure: LOWER EXTREMITY ANGIOGRAPHY;  Surgeon: [name], MD;  Location: [facility];  Service: Cardiovascular;  Laterality: Right;  . LOWER EXTREMITY ANGIOGRAPHY Right 01/13/2018   Procedure: LOWER EXTREMITY ANGIOGRAPHY;  Surgeon: [name], MD;  Location: [facility];  Service: Cardiovascular;  Laterality: Right;  . LOWER EXTREMITY INTERVENTION  10/25/2017   Procedure: LOWER EXTREMITY INTERVENTION;  Surgeon: [name], MD;  Location: [facility];  Service: Cardiovascular;;  . TONSILLECTOMY      Prior to Admission medications   Medication Sig Start Date End Date Taking? Authorizing Provider  acetaminophen (TYLENOL) 500 MG tablet Take 1,000 mg by mouth every 6 (six) hours as needed (for pain.).    [provider]  apixaban (ELIQUIS) 5 MG TABS tablet Take 1 tablet (5 mg total)  by mouth 2 (two) times daily. 02/21/18   Park Liter P, DO  aspirin 81 MG EC tablet Take 1 tablet (81 mg total) by mouth daily. 02/21/18   Johnson, Megan P, DO  Biotin (BIOTIN 5000) 5 MG CAPS Take 1 capsule by mouth daily.    [provider]  cholecalciferol (VITAMIN D) 1000 units tablet Take 1,000 Units by mouth daily.    [provider]  empagliflozin (JARDIANCE) 10 MG TABS tablet Take 10 mg by mouth daily. Patient taking differently: Take 5 mg daily by mouth.  05/20/17   Johnson, Megan P, DO  ezetimibe-simvastatin (VYTORIN) 10-40 MG tablet Take 1 tablet by mouth daily at 6 PM. 02/14/18   Gollan, Kathlene November, MD  ferrous sulfate (FERROUSUL) 325 (65 FE) MG tablet Take 1 tablet (325 mg total) by mouth daily with  breakfast. 04/01/18   Park Liter P, DO  gabapentin (NEURONTIN) 400 MG capsule Take 1 capsule (400 mg total) by mouth 3 (three) times daily. Patient taking differently: Take 400 mg by mouth every 8 (eight) hours as needed (for pain).  03/31/18   Johnson, Megan P, DO  hydrochlorothiazide (MICROZIDE) 12.5 MG capsule Take 1 capsule (12.5 mg total) by mouth daily. 04/06/18 07/05/18  Rise Mu, PA-C  metFORMIN (GLUCOPHAGE) 1000 MG tablet Take 1 tablet (1,000 mg total) by mouth 2 (two) times daily with a meal. 05/20/17   Johnson, Megan P, DO  metoprolol tartrate (LOPRESSOR) 25 MG tablet Take 1 tablet (25 mg total) by mouth 2 (two) times daily. 04/11/18   Rise Mu, PA-C  Multiple Vitamin (MULTIVITAMIN WITH MINERALS) TABS tablet Take 1 tablet by mouth daily. Patient not taking: Reported on 04/13/2018 06/24/17   Fritzi Mandes, MD  oxyCODONE-acetaminophen (PERCOCET) 10-325 MG tablet Take 1 tablet by mouth every 8 (eight) hours as needed for pain. 04/13/18 05/13/18  Volney American, PA-C  sulfamethoxazole-trimethoprim (BACTRIM DS,SEPTRA DS) 800-160 MG tablet Take 1 tablet by mouth 2 (two) times daily. 04/13/18   Volney American, PA-C  vitamin C (ASCORBIC ACID) 500 MG tablet Take 500 mg by mouth daily.    [provider]    Family History  Problem Relation Age of Onset  . Diabetes Father   . Hypertension Father   . Hyperlipidemia Father      Social History   Tobacco Use  . Smoking status: Former Smoker    Packs/day: 0.25    Years: 30.00    Pack years: 7.50    Types: Cigarettes  . Smokeless tobacco: Never Used  Substance Use Topics  . Alcohol use: Yes    Alcohol/week: 8.0 standard drinks    Types: 8 Cans of beer per week    Comment: occasionally  . Drug use: No    Allergies as of 04/13/2018 - Review Complete 04/13/2018  Allergen Reaction Noted  . Sodium pentobarbital [pentobarbital] Shortness Of Breath 01/13/2018  . Lipitor [atorvastatin] Rash 03/15/2015    Review  of Systems:    All systems reviewed and negative except where noted in HPI.   Physical Exam:  There were no vitals taken for this visit. No LMP for male patient. Psych:  Alert and cooperative. Normal mood and affect.[place] with a stick  General:   Alert,  Well-developed, well-nourished, pleasant and cooperative in NAD Head:  Normocephalic and atraumatic. Eyes:  Sclera clear, no icterus.   Conjunctiva pink. Ears:  Normal auditory acuity. Nose:  No deformity, discharge, or lesions. Mouth:  No deformity or lesions,oropharynx pink &  moist. Neck:  Supple; no masses or thyromegaly. Lungs:  Respirations even and unlabored.  Clear throughout to auscultation.   No wheezes, crackles, or rhonchi. No acute distress. Heart:  Regular rate and rhythm; no murmurs, clicks, rubs, or gallops. Abdomen:  Normal bowel sounds.  No bruits.  Soft, non-tender and non-distended without masses, hepatosplenomegaly or hernias noted.  No guarding or rebound tenderness.    Neurologic:  Alert and oriented x3;  grossly normal neurologically. Skin:  Intact without significant lesions or rashes. No jaundice. Lymph Nodes:  No significant cervical adenopathy. Psych:  Alert and cooperative. Normal mood and affect.  Imaging Studies: No results found.  Assessment and Plan:   Everardo Voris. is a 62 y.o. y/o male has been referred for iron deficiency anemia and stool occult blood test positive.  No overt blood loss noted.  He is on Eliquis.  Plan 1.  Urgent EGD with colonoscopy and if negative capsule study of the small bowel.  Continue oral iron.  He will require cardiac in stool or Eliquis for the procedure. 2.  Celiac serology urine analysis, B12 and folate levels will be checked. 3.  If iron studies do not improve in a few weeks time then he will require IV iron.  I have discussed alternative options, risks & benefits,  which include, but are not limited to, bleeding, infection, perforation,respiratory complication &  drug reaction.  The patient agrees with this plan & written consent will be obtained.     Follow up in 4 weeks  Dr [name] MD,MRCP(U.K)

## 2018-04-15 ENCOUNTER — Ambulatory Visit: Admit: 2018-04-15 | Payer: Medicare HMO | Source: Ambulatory Visit | Admitting: Podiatry

## 2018-04-15 ENCOUNTER — Encounter: Payer: Self-pay | Source: Ambulatory Visit

## 2018-04-15 ENCOUNTER — Ambulatory Visit: Payer: Self-pay

## 2018-04-15 ENCOUNTER — Encounter: Payer: Medicare HMO | Attending: Physician Assistant | Admitting: Physician Assistant

## 2018-04-15 DIAGNOSIS — Z8673 Personal history of transient ischemic attack (TIA), and cerebral infarction without residual deficits: Secondary | ICD-10-CM | POA: Insufficient documentation

## 2018-04-15 DIAGNOSIS — E11621 Type 2 diabetes mellitus with foot ulcer: Secondary | ICD-10-CM | POA: Insufficient documentation

## 2018-04-15 DIAGNOSIS — L97519 Non-pressure chronic ulcer of other part of right foot with unspecified severity: Secondary | ICD-10-CM | POA: Diagnosis not present

## 2018-04-15 DIAGNOSIS — Z7984 Long term (current) use of oral hypoglycemic drugs: Secondary | ICD-10-CM | POA: Insufficient documentation

## 2018-04-15 DIAGNOSIS — F0390 Unspecified dementia without behavioral disturbance: Secondary | ICD-10-CM | POA: Insufficient documentation

## 2018-04-15 DIAGNOSIS — E1152 Type 2 diabetes mellitus with diabetic peripheral angiopathy with gangrene: Secondary | ICD-10-CM | POA: Insufficient documentation

## 2018-04-15 DIAGNOSIS — I10 Essential (primary) hypertension: Secondary | ICD-10-CM | POA: Insufficient documentation

## 2018-04-15 DIAGNOSIS — Z87891 Personal history of nicotine dependence: Secondary | ICD-10-CM | POA: Diagnosis not present

## 2018-04-15 DIAGNOSIS — R69 Illness, unspecified: Secondary | ICD-10-CM | POA: Diagnosis not present

## 2018-04-15 LAB — CELIAC DISEASE PANEL
ENDOMYSIAL IGA: NEGATIVE
IgA/Immunoglobulin A, Serum: 319 mg/dL (ref 61–437)
Transglutaminase IgA: <2 U/mL (ref 0–3)

## 2018-04-15 LAB — URINALYSIS, ROUTINE W REFLEX MICROSCOPIC
BILIRUBIN UA: NEGATIVE
Ketones, UA: NEGATIVE
LEUKOCYTES UA: NEGATIVE
Nitrite, UA: NEGATIVE
PH UA: 7 (ref 5.0–7.5)
Protein, UA: NEGATIVE
RBC UA: NEGATIVE
Specific Gravity, UA: >=1.03 — AB (ref 1.005–1.030)
Urobilinogen, Ur: 1 mg/dL (ref 0.2–1.0)

## 2018-04-15 LAB — B12 AND FOLATE PANEL
Folate: 16.3 ng/mL (ref >3.0)
Vitamin B-12: 273 pg/mL (ref 232–1245)

## 2018-04-15 SURGERY — AMPUTATION, TOE
Anesthesia: Choice | Laterality: Right

## 2018-04-15 NOTE — Telephone Encounter (Addendum)
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 Pt cannot go through the weekend because current pain med not helping at all. Pt needing something until he can have them removed. Please call pt back to let him know what he can do asap.  Patient called, left VM to return call back to the office. Last OV:04/13/18, Percocet refill on 04/13/18 90 tab/0 refill.

## 2018-04-15 NOTE — Telephone Encounter (Signed)
He will need to go to the ER.

## 2018-04-15 NOTE — Telephone Encounter (Signed)
Called and left a message the patient and his wife letting them know that he needs to go to the ER.

## 2018-04-16 NOTE — Assessment & Plan Note (Signed)
Will continue bactrim and provide 1 month supply of percocet. Re-eval at that time depending on surgical date. Sedation and addiction precautions reviewed

## 2018-04-16 NOTE — Patient Instructions (Signed)
Follow up in 1 month   

## 2018-04-16 NOTE — Assessment & Plan Note (Signed)
Followed by vascular. Stopped smoking recently. Awaiting amputation of right 4th and 5th toes with podiatry

## 2018-04-17 ENCOUNTER — Encounter: Payer: Self-pay | Admitting: Gastroenterology

## 2018-04-18 ENCOUNTER — Telehealth: Payer: Self-pay | Admitting: Family Medicine

## 2018-04-18 ENCOUNTER — Telehealth: Payer: Self-pay | Admitting: Cardiovascular Disease

## 2018-04-18 ENCOUNTER — Ambulatory Visit
Admission: RE | Admit: 2018-04-18 | Discharge: 2018-04-18 | Disposition: A | Discharge status: Home / Self Care | Payer: Medicare HMO | Source: Ambulatory Visit | Attending: Physician Assistant | Admitting: Physician Assistant

## 2018-04-18 DIAGNOSIS — K8020 Calculus of gallbladder without cholecystitis without obstruction: Secondary | ICD-10-CM | POA: Diagnosis not present

## 2018-04-18 DIAGNOSIS — I700 Atherosclerosis of aorta: Secondary | ICD-10-CM | POA: Diagnosis not present

## 2018-04-18 DIAGNOSIS — R918 Other nonspecific abnormal finding of lung field: Secondary | ICD-10-CM | POA: Insufficient documentation

## 2018-04-18 DIAGNOSIS — J439 Emphysema, unspecified: Secondary | ICD-10-CM | POA: Diagnosis not present

## 2018-04-18 DIAGNOSIS — I311 Chronic constrictive pericarditis: Secondary | ICD-10-CM | POA: Insufficient documentation

## 2018-04-18 DIAGNOSIS — I77810 Thoracic aortic ectasia: Secondary | ICD-10-CM | POA: Insufficient documentation

## 2018-04-18 IMAGING — CT CT ANGIO CHEST
2 of 6 series · 13 of 36 positions shown · IV contrast (iopamidol)
Comparison: None.

CLINICAL DATA: Evaluate aortic dilatation

EXAM:
CT ANGIOGRAPHY CHEST WITH CONTRAST
TECHNIQUE: Multidetector CT imaging of the chest was performed using the
standard protocol during bolus administration of intravenous
contrast. Multiplanar CT image reconstructions and MIPs were
obtained to evaluate the vascular anatomy.
CONTRAST:  75mL BCHQAY-1KK IOPAMIDOL (BCHQAY-1KK) INJECTION 76%

[Series 5: axial arterial cta thorax · axial · arterial · 0.70mm/px · z∈[-1205,-897]mm · 12 of 183 slices shown]
[im 15/183  lung]
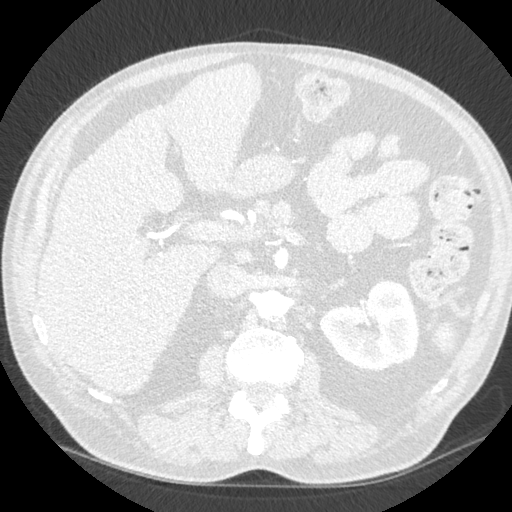
[im 29/183  mediastinal]
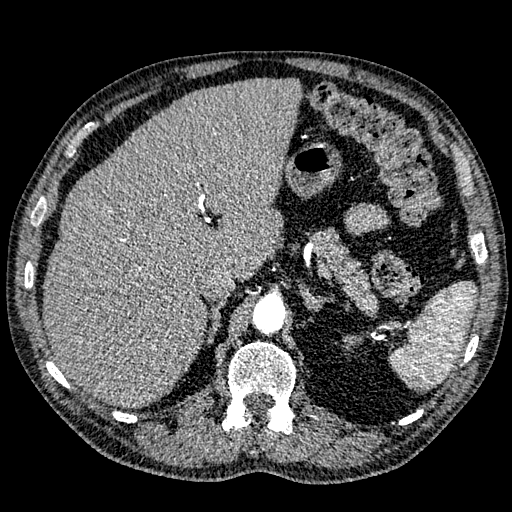
[im 43/183  lung]
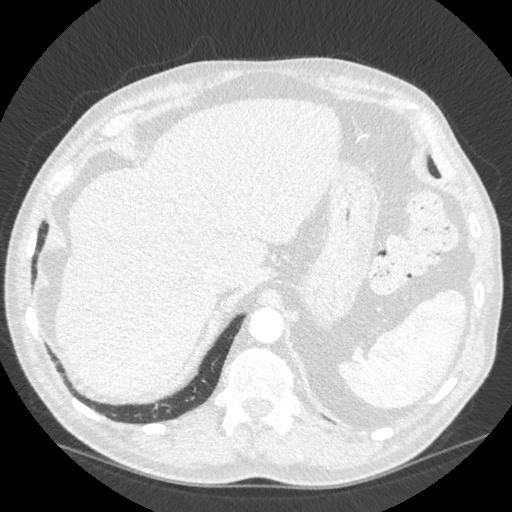
[im 57/183  mediastinal]
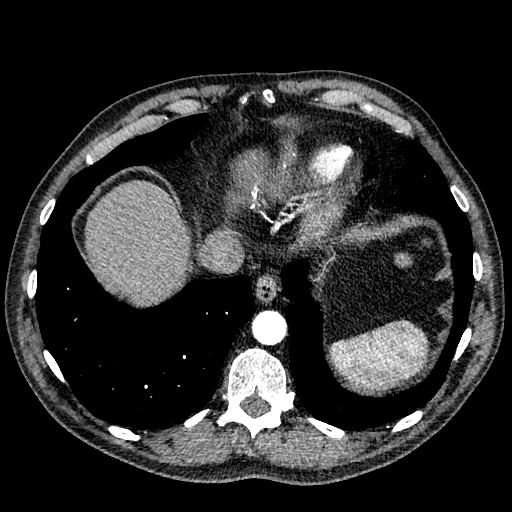
[im 71/183  lung]
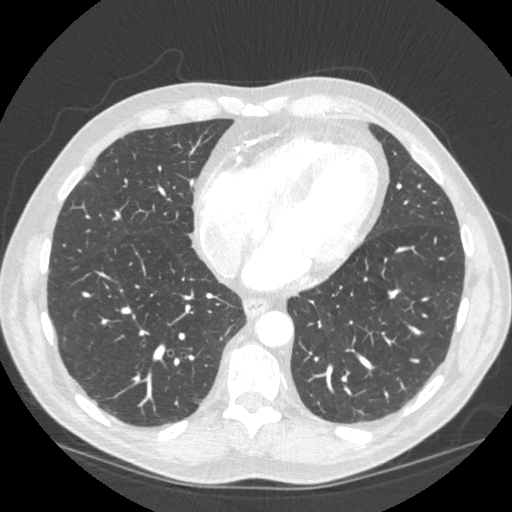
[im 85/183  mediastinal]
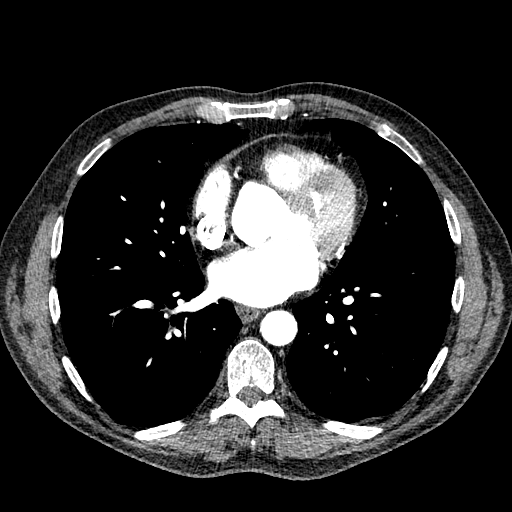
[im 99/183  lung]
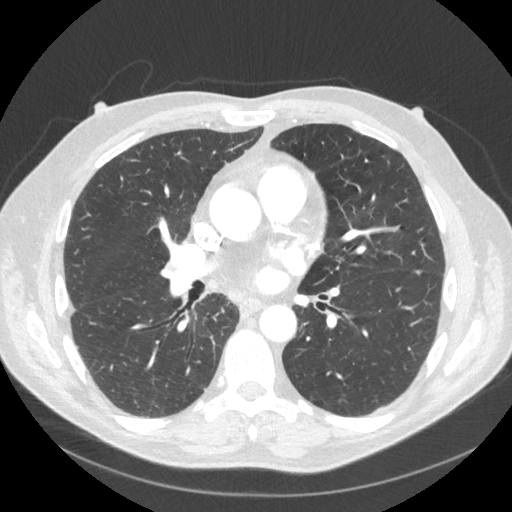
[im 113/183  mediastinal]
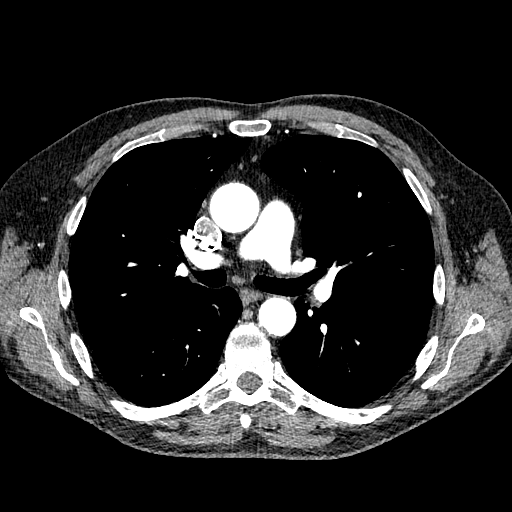
[im 127/183  lung]
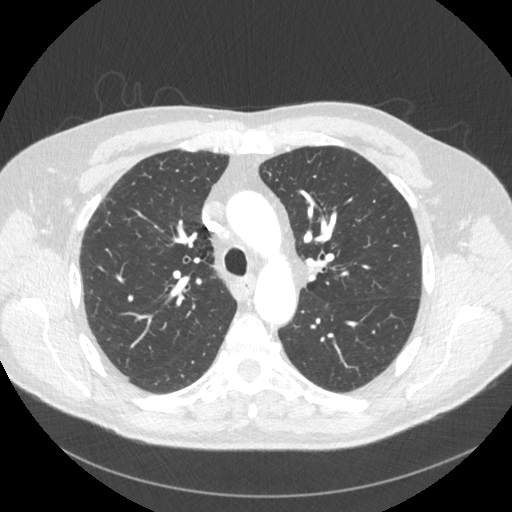
[im 141/183  mediastinal]
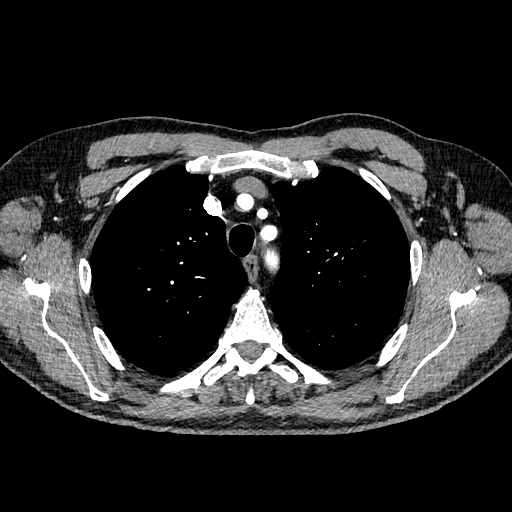
[im 155/183  lung]
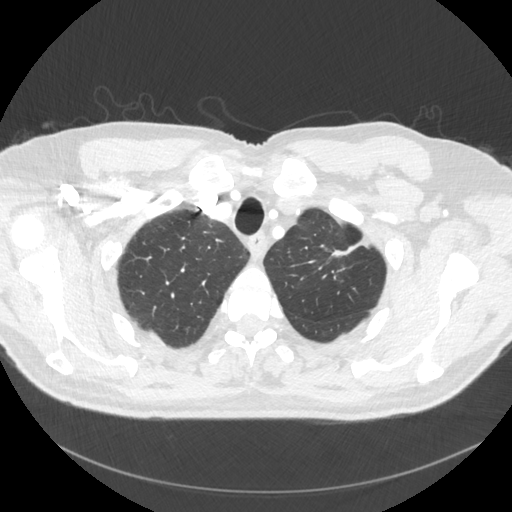
[im 169/183  mediastinal]
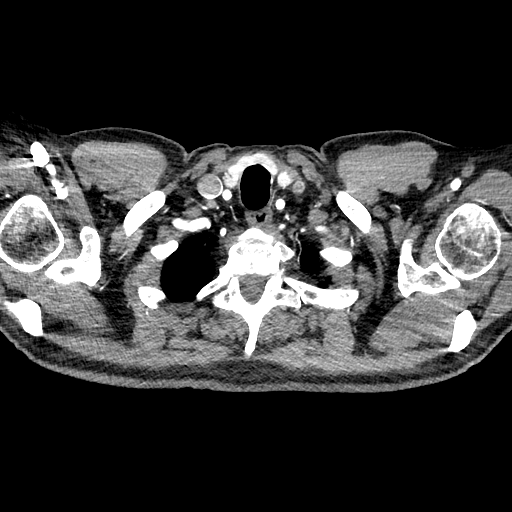

[Series 8: cor st cta thorax · coronal · 0.70mm/px · 1 of 157 slices shown]
[im 79/157  mediastinal]
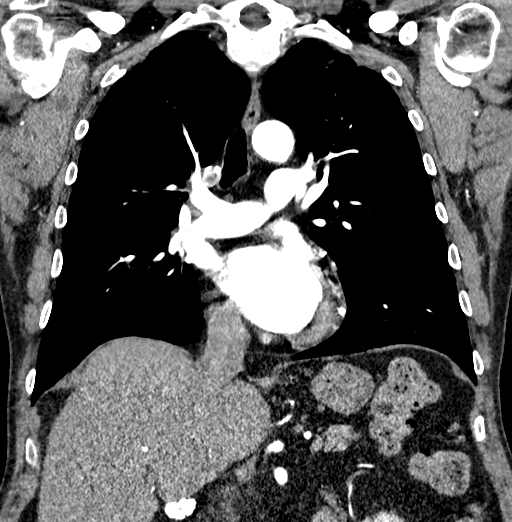

[13 of 36 positions shown; findings below may reference images not displayed]

FINDINGS: Cardiovascular: Thoracic aorta demonstrates a normal branching
pattern. Mild atherosclerotic changes are identified. The ascending
aorta measures 3.6 cm in greatest dimension. At the sinus of
Valsalva the aorta measures 4.1 cm. The Kebich junction
measures 3.1 cm. No dissection is identified. Mild pericardial
calcifications are seen. No pericardial effusion is noted. The
pulmonary artery as visualized is within normal limits.

Mediastinum/Nodes: Thoracic inlet is unremarkable. No significant
hilar or mediastinal adenopathy is seen. The esophagus is within
normal limits.

Lungs/Pleura: Mild emphysematous changes are identified. Few
scattered small less than 5 mm nodules are identified bilaterally.
Mild scarring is noted in the left apex. Some associated nodularity
measuring 9 mm with mild spiculation is noted adjacent to the area
of scarring. No other significant nodules are noted. No focal
infiltrate or effusion is seen.

Upper Abdomen: Cholelithiasis is noted. The remainder of the upper
abdomen is within normal limits.

Musculoskeletal: Degenerative changes of the thoracic spine are
noted.

Review of the MIP images confirms the above findings.
IMPRESSION: Scattered nodules are identified throughout both lungs. The majority
of these measure less than 5 mm although a more dominant 9 mm mildly
spiculated nodule is noted in the left upper lobe best seen on image
number 39 of series 6. This may simply be related to scarring
although Non-contrast chest CT at 3-6 months is recommended. If the
nodules are stable at time of repeat CT, then future CT at 18-24
months (from today's scan) is considered optional for low-risk
patients, but is recommended for high-risk patients. This
recommendation follows the consensus statement: Guidelines for
Management of Incidental Pulmonary Nodules Detected on CT Images:

Cholelithiasis.

Pericardial calcifications without effusion.

Prominence of the aorta at the level of the sinus of Valsalva
although no ascending aneurysm is seen. Follow-up can be determined
on a clinical basis.

Aortic Atherosclerosis (9Y7LW-7KX.X) and Emphysema (9Y7LW-MWH.Q).

## 2018-04-18 MED ORDER — IOPAMIDOL (ISOVUE-370) INJECTION 76%
75.0000 mL | Freq: Once | INTRAVENOUS | Status: AC | PRN
  Administered 2018-04-18: 75 mL via INTRAVENOUS

## 2018-04-18 NOTE — Progress Notes (Signed)
Form faxed back to Dr. Dorothey Baseman office. Unfortunately, Trevonn had a stroke less than a year ago. I'm not comfortable with him coming off his eliquis unless he has to right now. I;m going to bridge him with lovenox 2 days before his procedure until 2 days after.   Please call his wife and see if she'd be able to give him 10 shots around the time of his surgery? If not- we can try to get home health out to give him his shots before and after his colonoscopy. He needs to stop his eliquis 2 days before his colonoscopy and restart it 2 days after.

## 2018-04-18 NOTE — Telephone Encounter (Signed)
Copied from [place] [id number]. Topic: General - Other >> Apr 18, 2018 10:35 AM Valla Leaver wrote: Reason for CRM: Fax sent by [facility] on 09/11 and 09/13 to get clearance for eloquis for his upcoming colonoscopy on 09/19 but Dr. Wynetta Emery has not responded.

## 2018-04-18 NOTE — Telephone Encounter (Signed)
° °  [facility] Pre-operative Risk Assessment    Request for surgical clearance:  1. What type of surgery is being performed? Colonoscopy and upper endoscopy   2. When is this surgery scheduled? 09-19  3. What type of clearance is required (medical clearance vs. Pharmacy clearance to hold med vs. Both)? Pharmacy per patient wife   4. Are there any medications that need to be held prior to surgery and how long? Eliquis 5 mg po po BID   5. Practice name and name of physician performing surgery?  [facility]   6. What is your office phone number  [phone]    7.   What is your office fax number [phone]  8.   Anesthesia type (None, local, MAC, general) ? Unknown    Clarisse Gouge 04/18/2018, 4:31 PM  _________________________________________________________________   (provider comments below)

## 2018-04-19 ENCOUNTER — Telehealth: Payer: Self-pay

## 2018-04-19 NOTE — Telephone Encounter (Signed)
Office visit note with clearance routed to number listed.

## 2018-04-19 NOTE — Telephone Encounter (Signed)
Spoke with pt wife and informed her that we have received pt blood thinner clearance from [employer]. Pt is aware that he will need to hold Eliquis 1 day prior to the procedure and resume 24 hours after.

## 2018-04-19 NOTE — Progress Notes (Signed)
Called wife to see if she'd be comfortable giving lovenox injections. [name] has given clearance for him to be off eliquis x1 day. No need for anything further.

## 2018-04-19 NOTE — Telephone Encounter (Signed)
Pt wife called stating she is returning Tiffany's call.  Please call back. Priebe,Deborah (EC) [phone]

## 2018-04-20 ENCOUNTER — Encounter: Payer: Self-pay | Admitting: *Deleted

## 2018-04-20 NOTE — Progress Notes (Signed)
[name], [name] ([id number]) Visit Report for 04/15/2018 Chief Complaint Document Details Patient Name: [name]. Date of Service: 04/15/2018 3:15 PM Medical Record Number: [id number] Patient Account Number: [id number] Date of Birth/Sex: Jul 05, 1956 (62 y.o. M) Treating RN: Montey Hora Primary Care Provider: Park Liter Other Clinician: Referring Provider: Park Liter Treating Provider/Extender: Melburn Hake, Dave Mannes Weeks in Treatment: 6 Information Obtained from: Patient Chief Complaint right toe Electronic Signature(s) Signed: 04/15/2018 10:43:54 PM By: Worthy Keeler PA-C Entered By: Worthy Keeler on 04/15/2018 15:21:00 [name] ([id number]) -------------------------------------------------------------------------------- HPI Details Patient Name: Glenn Standard B. Date of Service: 04/15/2018 3:15 PM Medical Record Number: [id number] Patient Account Number: [id number] Date of Birth/Sex: 09-Mar-1956 (62 y.o. M) Treating RN: Montey Hora Primary Care Provider: Park Liter Other Clinician: Referring Provider: Park Liter Treating Provider/Extender: Melburn Hake, Owynn Mosqueda Weeks in Treatment: 6 History of Present Illness HPI Description: 03/03/18- He is here for initial evaluation of right toe wounds and tissue necrosis. He saw Dr. Vickki Muff on 7/23 where he was noted to have progressive ischemic changes to his toes. He was referred from Dr. Fellows office to the wound clinic for possible hyperbaric oxygen therapy. At this time, based on the information we have at hand, there is no diagnosis that would qualify him for hyperbaric oxygen therapy. He underwent an angiogram on 6/13 by Dr. 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 He did not have intervention on 6/13. arterial studies performed on 6/19 showed resting ABI to the 0.99 (right) and 1.09 (left). 03/10/18-He is seen in follow up evaluation. Plain film x-ray ordered last week revealed no evidence of acute osseous abnormality and dorsal soft tissue swelling of the midfoot. Culture that was obtained last week from the right great toe was negative. He has an appointment with Dr Lucky Cowboy on 8/23. 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 The right great toe with a small, superficial, open area with resolution of erythema and improvement in edema; will continue with medihoney. He will follow up next week 03/17/18-He is seen in follow-up evaluation. He was able to reschedule his appointment with vascular medicine and was seen on 8/14. In office ABIs are similar to studies on 6/19, right toe waveform is blunted with known small vessel disease; they are recommending compression therapy which the patient is refusing to wear. 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 Vickki Muff. The patient's wife is going to try and move that appointment up to next week. The patient does exhibit right foot pain consistent with rest pain, relieved with dependent position. There is cyanotic discoloration to right toes 1-3 and along the lateral aspect and heel of the right foot. The wound to the dorsal aspect of the right great toe is larger, dry; we will initiate santyl. He can follow up in 1-2 weeks 04/15/18 on evaluation today patient's right foot ulcer in regard to the fourth and fifth toes really appears to be no better. In fact this is pretty much a complete region of gangrene that this point. He also has microvascular disease and the remaining toes and the discoloration  noted today  is extremely cyanotic blue/purple. He actually was supposed to have Artie had invitation but unfortunately this is been delayed because there is some question as to whether or not he may actually have a G.I. bleed due to being severely anemic. He's also apparently having some issues with his heart which is also delayed the process at this point. Unfortunately in general nothing seems to be improving if anything his wife tells me that this seems to be getting worse. Electronic Signature(s) Signed: 04/15/2018 10:43:54 PM By: Worthy Keeler PA-C Entered By: Worthy Keeler on 04/15/2018 20:19:58 [name] ([id number]) -------------------------------------------------------------------------------- Physical Exam Details Patient Name: Glenn Standard B. Date of Service: 04/15/2018 3:15 PM Medical Record Number: [id number] Patient Account Number: [id number] Date of Birth/Sex: Oct 06, 1955 (63 y.o. M) Treating RN: Montey Hora Primary Care Provider: Park Liter Other Clinician: Referring Provider: Park Liter Treating Provider/Extender: Melburn Hake, Dellamae Rosamilia Weeks in Treatment: 6 Constitutional Well-nourished and well-hydrated in no acute distress. Respiratory normal breathing without difficulty. clear to auscultation bilaterally. Cardiovascular regular rate and rhythm with normal S1, S2. Psychiatric this patient is able to make decisions and demonstrates good insight into disease process. Alert and Oriented x 3. pleasant and cooperative. Notes On inspection at this point patient's first, second, and third toes all appear to be cyanotic. In regard to the fourth and fifth toes he actually had gangrene noted. Again at this point we're trying to maintain a dry gangrene situation as opposed to obviously allowing for infection and wet gangrene in general which would indicate and necessitate a more emergent amputation. Obviously right now the patient's health is at a point where  surgery is not necessarily the best thing for him although he really does need to be able to proceed with the amputation. Electronic Signature(s) Signed: 04/15/2018 10:43:54 PM By: Worthy Keeler PA-C Entered By: Worthy Keeler on 04/15/2018 20:22:54 [name] ([id number]) -------------------------------------------------------------------------------- Physician Orders Details Patient Name: [name] Date of Service: 04/15/2018 3:15 PM Medical Record Number: [id number] Patient Account Number: [id number] Date of Birth/Sex: 07-Nov-1955 (62 y.o. M) Treating RN: Roger Shelter Primary Care Provider: Park Liter Other Clinician: Referring Provider: Park Liter Treating Provider/Extender: Melburn Hake, Natoya Viscomi Weeks in Treatment: 6 Verbal / Phone Orders: No Diagnosis Coding ICD-10 Coding Code Description E11.621 Type 2 diabetes mellitus with foot ulcer S91.101S Unspecified open wound of right great toe without damage to nail, sequela R23.0 Cyanosis I73.9 Peripheral vascular disease, unspecified Wound Cleansing Wound #3 Right Toe Great o Clean wound with Normal Saline. Anesthetic (add to Medication List) Wound #3 Right Toe Great o Topical Lidocaine 4% cream applied to wound bed prior to debridement (In Clinic Only). Primary Wound Dressing Wound #3 Right Toe Great o Other: - betadine Follow-up Appointments Wound #3 Right Toe Great o Return Appointment in 2 weeks. Edema Control Wound #3 Right Toe Great o Elevate legs to the level of the heart and pump ankles as often as possible Additional Orders / Instructions Wound #3 Right Toe Great o Increase protein intake. Electronic Signature(s) Signed: 04/15/2018 5:59:54 PM By: Roger Shelter Signed: 04/15/2018 10:43:54 PM By: Worthy Keeler PA-C Entered By: Roger Shelter on 04/15/2018 16:21:34 Hanlon, [name]  ([id number]) -------------------------------------------------------------------------------- Problem List Details Patient Name: Glenn Standard B. Date of Service: 04/15/2018 3:15 PM Medical Record Number: [id number] Patient Account Number: [id number] Date of Birth/Sex: 08-18-1955 (62 y.o. M) Treating RN: Montey Hora Primary Care Provider: Park Liter Other Clinician: Referring Provider: Park Liter Treating Provider/Extender: Melburn Hake, Phares Zaccone Weeks in  Treatment: 6 Active Problems ICD-10 Evaluated Encounter Code Description Active Date Today Diagnosis E11.621 Type 2 diabetes mellitus with foot ulcer 03/03/2018 No Yes S91.101S Unspecified open wound of right great toe without damage to 03/03/2018 No Yes nail, sequela R23.0 Cyanosis 03/03/2018 No Yes I73.9 Peripheral vascular disease, unspecified 03/03/2018 No Yes Inactive Problems Resolved Problems Electronic Signature(s) Signed: 04/15/2018 10:43:54 PM By: Worthy Keeler PA-C Entered By: Worthy Keeler on 04/15/2018 15:20:53 Weesner, [name] ([id number]) -------------------------------------------------------------------------------- Progress Note Details Patient Name: Glenn Standard B. Date of Service: 04/15/2018 3:15 PM Medical Record Number: [id number] Patient Account Number: [id number] Date of Birth/Sex: 03/21/1956 (62 y.o. M) Treating RN: Montey Hora Primary Care Provider: Park Liter Other Clinician: Referring Provider: Park Liter Treating Provider/Extender: Melburn Hake, Topacio Cella Weeks in Treatment: 6 Subjective Chief Complaint Information obtained from Patient right toe History of Present Illness (HPI) 03/03/18- He is here for initial evaluation of right toe wounds and tissue necrosis. He saw Dr. Vickki Muff on 7/23 where he was noted to have progressive ischemic changes to his toes. He was referred from Dr. Fellows office to the wound clinic for possible hyperbaric oxygen therapy. At this time, based on the information we  have at hand, there is no diagnosis that would qualify him for hyperbaric oxygen therapy. He underwent an angiogram on 6/13 by Dr. 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 He did not have intervention on 6/13. arterial studies performed on 6/19 showed resting ABI to the 0.99 (right) and 1.09 (left). 03/10/18-He is seen in follow up evaluation. Plain film x-ray ordered last week revealed no evidence of acute osseous abnormality and dorsal soft tissue swelling of the midfoot. Culture that was obtained last week from the right great toe was negative. He has an appointment with Dr Lucky Cowboy on 8/23. 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 The right great toe with a small, superficial, open area with resolution of erythema and improvement in edema; will continue with medihoney. He will follow up next week 03/17/18-He is seen in follow-up evaluation. He was able to reschedule his appointment with vascular medicine and was seen on 8/14. In office ABIs are similar to studies on 6/19, right toe waveform is blunted with known small vessel disease; they are recommending compression therapy which the patient is refusing to wear. 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 Vickki Muff. The patient's wife is going to try and move that appointment up to next week. The patient does exhibit right foot pain consistent with rest pain, relieved with dependent position. There is cyanotic discoloration to right toes 1-3 and along the lateral aspect and heel  of the right foot. The wound to the dorsal aspect of the right great toe is larger, dry; we will initiate santyl. He can follow up in 1-2 weeks 04/15/18 on evaluation today patient's right foot ulcer in regard to the fourth and fifth toes really appears to be no better. In fact this is pretty much a complete region of gangrene that this point. He also has microvascular disease and the remaining toes and the discoloration noted today is extremely cyanotic blue/purple.  He actually was supposed to have Artie had invitation but unfortunately this is been delayed because there is some question as to whether or not he may actually have a G.I. bleed due to being severely anemic. He's also apparently having some issues with his heart which is also delayed the process at this point. Unfortunately in general nothing seems to be improving if anything his wife tells me that this seems to be getting worse. Patient History Information obtained from Patient. Family History No family history of Cancer, Diabetes, Heart Disease, Hypertension, Kidney Disease, Lung Disease, Seizures, Stroke, Thyroid Problems, Tuberculosis. Social History Former smoker - ended on 01/31/2018, Marital Status - Married, Alcohol Use - Moderate, Drug Use - No History, Caffeine Use - Daily. [name], [name] ([id number]) Medical History Hospitalization/Surgery History - 06/03/2017, [facility], Stroke. Review of Systems (ROS) Constitutional Symptoms (General Health) Denies complaints or symptoms of Fever, Chills. Respiratory The patient has no complaints or symptoms. Cardiovascular The patient has no complaints or symptoms. Psychiatric The patient has no complaints or symptoms. Objective Constitutional Well-nourished and well-hydrated in no acute distress. Vitals Time Taken: 2:45 PM, Height: 70 in, Weight: 185 lbs, BMI: 26.5, Temperature: 98.2 F, Pulse: 76 bpm, Respiratory Rate: 16 breaths/min, Blood Pressure: 126/66  mmHg. Respiratory normal breathing without difficulty. clear to auscultation bilaterally. Cardiovascular regular rate and rhythm with normal S1, S2. Psychiatric this patient is able to make decisions and demonstrates good insight into disease process. Alert and Oriented x 3. pleasant and cooperative. General Notes: On inspection at this point patient's first, second, and third toes all appear to be cyanotic. In regard to the fourth and fifth toes he actually had gangrene noted. Again at this point we're trying to maintain a dry gangrene situation as opposed to obviously allowing for infection and wet gangrene in general which would indicate and necessitate a more emergent amputation. Obviously right now the patient's health is at a point where surgery is not necessarily the best thing for him although he really does need to be able to proceed with the amputation. Integumentary (Hair, Skin) Wound #3 status is Open. Original cause of wound was Gradually Appeared. The wound is located on the Right Toe Great. The wound measures 0.9cm length x 0.6cm width x 0.1cm depth; 0.424cm^2 area and 0.042cm^3 volume. There is no tunneling or undermining noted. There is a none present amount of drainage noted. The wound margin is flat and intact. There is no granulation within the wound bed. There is a medium (34-66%) amount of necrotic tissue within the wound bed including Adherent Slough. The periwound skin appearance exhibited: Mottled. The periwound skin appearance did not exhibit: Callus, Crepitus, Excoriation, Induration, Rash, Scarring, Dry/Scaly, Maceration, Atrophie Blanche, Cyanosis, Ecchymosis, Hemosiderin Staining, Pallor, Rubor, Erythema. Periwound temperature was noted as No Abnormality. The periwound has tenderness on palpation. [name], [name] ([id number]) Assessment Active Problems ICD-10 Type 2 diabetes mellitus with foot ulcer Unspecified open wound of right great toe without damage to  nail, sequela Cyanosis Peripheral vascular disease, unspecified Plan Wound Cleansing: Wound #3 Right Toe Great: Clean wound with Normal Saline. Anesthetic (add to Medication List): Wound #3 Right Toe Great: Topical Lidocaine 4% cream applied to wound bed prior to debridement (In Clinic Only). Primary Wound Dressing: Wound #3 Right Toe Great: Other: - betadine Follow-up Appointments: Wound #3 Right Toe Great: Return Appointment in 2 weeks. Edema Control: Wound #3 Right Toe Great: Elevate legs to the level of the heart and pump ankles as often as possible Additional Orders /  Instructions: Wound #3 Right Toe Great: Increase protein intake. We are gonna continue with the above wound care measures for the next week. The patient is in agreement with plan. Subsequently we're gonna see him back for reevaluation in two weeks time just to see were things stand. Please see above for specific wound care orders. We will see patient for re-evaluation in 2 week(s) here in the clinic. If anything worsens or changes patient will contact our office for additional recommendations. Electronic Signature(s) Signed: 04/15/2018 10:43:54 PM By: Worthy Keeler PA-C Entered By: Worthy Keeler on 04/15/2018 20:23:37 Dugo, [name] ([id number]) -------------------------------------------------------------------------------- ROS/PFSH Details Patient Name: Glenn Standard B. Date of Service: 04/15/2018 3:15 PM Medical Record Number: [id number] Patient Account Number: [id number] Date of Birth/Sex: 1956/04/07 (62 y.o. M) Treating RN: Montey Hora Primary Care Provider: Park Liter Other Clinician: Referring Provider: Park Liter Treating Provider/Extender: Melburn Hake, Alyza Artiaga Weeks in Treatment: 6 Information Obtained From Patient Wound History Do you currently have one or more open woundso Yes How many open wounds do you currently haveo 2 Approximately how long have you had your woundso 2 month How  have you been treating your wound(s) until nowo neosporin or iodine Has your wound(s) ever healed and then re-openedo No Have you had any lab work done in the past montho Yes Who ordered the lab work doneo Dr. Wynetta Emery Have you tested positive for an antibiotic resistant organism (MRSA, VRE)o No Have you tested positive for osteomyelitis (bone infection)o No Have you had any tests for circulation on your legso Yes Where was the test doneo Dr. Lucky Cowboy Constitutional Symptoms (General Health) Complaints and Symptoms: Negative for: Fever; Chills Eyes Medical History: Negative for: Cataracts; Glaucoma; Optic Neuritis Ear/Nose/Mouth/Throat Medical History: Negative for: Chronic sinus problems/congestion; Middle ear problems Hematologic/Lymphatic Medical History: Negative for: Anemia; Hemophilia; Human Immunodeficiency Virus; Lymphedema; Sickle Cell Disease Respiratory Complaints and Symptoms: No Complaints or Symptoms Medical History: Negative for: Aspiration; Asthma; Chronic Obstructive Pulmonary Disease (COPD); Pneumothorax; Sleep Apnea; Tuberculosis Cardiovascular Complaints and Symptoms: No Complaints or Symptoms [name], [name] ([id number]) Medical History: Positive for: Arrhythmia - a-fib; Hypertension; Peripheral Arterial Disease Negative for: Angina; Congestive Heart Failure; Coronary Artery Disease; Deep Vein Thrombosis; Hypotension; Myocardial Infarction; Peripheral Venous Disease; Phlebitis; Vasculitis Gastrointestinal Medical History: Negative for: Cirrhosis ; Colitis; Crohnos; Hepatitis A; Hepatitis B; Hepatitis C Endocrine Medical History: Positive for: Type II Diabetes Negative for: Type I Diabetes Time with diabetes: 5 years Treated with: Oral agents Blood sugar tested every day: No Genitourinary Medical History: Negative for: End Stage Renal Disease Immunological Medical History: Negative for: Lupus Erythematosus; Raynaudos; Scleroderma Integumentary  (Skin) Medical History: Negative for: History of Burn; History of pressure wounds Musculoskeletal Medical History: Negative for: Gout; Rheumatoid Arthritis; Osteoarthritis; Osteomyelitis Neurologic Medical History: Positive for: Dementia - stroke Negative for: Neuropathy; Quadriplegia; Paraplegia; Seizure Disorder Oncologic Medical History: Negative for: Received Chemotherapy; Received Radiation Psychiatric Complaints and Symptoms: No Complaints or Symptoms Medical History: Negative for: Anorexia/bulimia; Confinement Anxiety [name], Uzziel B. ([id number]) Immunizations Pneumococcal Vaccine: Received Pneumococcal Vaccination: Yes Implantable Devices Hospitalization / Surgery History Name of Hospital Purpose of Hospitalization/Surgery Date [place] Stroke 06/03/2017 Family and Social History Cancer: No; Diabetes: No; Heart Disease: No; Hypertension: No; Kidney Disease: No; Lung Disease: No; Seizures: No; Stroke: No; Thyroid Problems: No; Tuberculosis: No; Former smoker - ended on 01/31/2018; Marital Status - Married; Alcohol Use: Moderate; Drug Use: No History; Caffeine Use: Daily; Advanced Directives: No; Patient does not want information on Advanced Directives; Living Will: No; Medical Power  of Attorney: No Physician Affirmation I have reviewed and agree with the above information. Electronic Signature(s) Signed: 04/15/2018 10:43:54 PM By: Worthy Keeler PA-C Signed: 04/18/2018 4:11:20 PM By: Montey Hora Entered By: Worthy Keeler on 04/15/2018 20:20:17 Krol, [name] ([id number]) -------------------------------------------------------------------------------- SuperBill Details Patient Name: Glenn Standard B. Date of Service: 04/15/2018 Medical Record Number: [id number] Patient Account Number: [id number] Date of Birth/Sex: 18-Jul-1956 (62 y.o. M) Treating RN: Roger Shelter Primary Care Provider: Park Liter Other Clinician: Referring Provider: Park Liter Treating  Provider/Extender: Melburn Hake, Kandyce Dieguez Weeks in Treatment: 6 Diagnosis Coding ICD-10 Codes Code Description E11.621 Type 2 diabetes mellitus with foot ulcer S91.101S Unspecified open wound of right great toe without damage to nail, sequela R23.0 Cyanosis I73.9 Peripheral vascular disease, unspecified Facility Procedures CPT4 Code: [id number] Description: [phone] - WOUND CARE VISIT-LEV 2 EST PT Modifier: Quantity: 1 Physician Procedures CPT4 Code: [id number] Description: [id number] - WC PHYS LEVEL 3 - EST PT ICD-10 Diagnosis Description E11.621 Type 2 diabetes mellitus with foot ulcer S91.101S Unspecified open wound of right great toe without damage to R23.0 Cyanosis I73.9 Peripheral vascular disease, unspecified Modifier: nail, sequela Quantity: 1 Electronic Signature(s) Signed: 04/15/2018 10:43:54 PM By: Worthy Keeler PA-C Previous Signature: 04/15/2018 5:59:54 PM Version By: Roger Shelter Entered By: Worthy Keeler on 04/15/2018 20:23:54

## 2018-04-20 NOTE — Progress Notes (Signed)
[name], [name] ([id number]) Visit Report for 04/15/2018 Arrival Information Details Patient Name: [name], [name]. Date of Service: 04/15/2018 3:15 PM Medical Record Number: [id number] Patient Account Number: [id number] Date of Birth/Sex: 1955/09/23 (62 y.o. M) Treating RN: Montey Hora Primary Care Merinda Victorino: Park Liter Other Clinician: Referring Mishka Stegemann: Park Liter Treating Raylynne Cubbage/Extender: Melburn Hake, HOYT Weeks in Treatment: 6 Visit Information History Since Last Visit Added or deleted any medications: Yes Patient Arrived: Cane Any new allergies or adverse reactions: No Arrival Time: 15:39 Had a fall or experienced change in No Accompanied By: spouse activities of daily living that may affect Transfer Assistance: None risk of falls: Patient Identification Verified: Yes Signs or symptoms of abuse/neglect since last visito No Secondary Verification Process Yes Hospitalized since last visit: No Completed: Implantable device outside of the clinic excluding No Patient Has Alerts: Yes cellular tissue based products placed in the center Patient Alerts: Patient on Blood since last visit: Thinner Pain Present Now: Yes Eliquis and 81mg  Aspirin Type II Diabetes Electronic Signature(s) Signed: 04/15/2018 4:56:18 PM By: Lorine Bears RCP, RRT, CHT Entered By: Becky Sax, Amado Nash on 04/15/2018 15:41:19 Groot, [name] ([id number]) -------------------------------------------------------------------------------- Clinic Level of Care Assessment Details Patient Name: Glenn Standard B. Date of Service: 04/15/2018 3:15 PM Medical Record Number: [id number] Patient Account Number: [id number] Date of Birth/Sex: Dec 10, 1955 (62 y.o. M) Treating RN: Roger Shelter Primary Care Caily Rakers: Park Liter Other Clinician: Referring Ashlinn Hemrick: Park Liter Treating Kaitlen Redford/Extender: Melburn Hake, HOYT Weeks in Treatment: 6 Clinic Level of Care Assessment  Items TOOL 4 Quantity Score X - Use when only an EandM is performed on FOLLOW-UP visit 1 0 ASSESSMENTS - Nursing Assessment / Reassessment X - Reassessment of Co-morbidities (includes updates in patient status) 1 10 X- 1 5 Reassessment of Adherence to Treatment Plan ASSESSMENTS - Wound and Skin Assessment / Reassessment X - Simple Wound Assessment / Reassessment - one wound 1 5 []  - 0 Complex Wound Assessment / Reassessment - multiple wounds []  - 0 Dermatologic / Skin Assessment (not related to wound area) ASSESSMENTS - Focused Assessment []  - Circumferential Edema Measurements - multi extremities 0 []  - 0 Nutritional Assessment / Counseling / Intervention []  - 0 Lower Extremity Assessment (monofilament, tuning fork, pulses) []  - 0 Peripheral Arterial Disease Assessment (using hand held doppler) ASSESSMENTS - Ostomy and/or Continence Assessment and Care []  - Incontinence Assessment and Management 0 []  - 0 Ostomy Care Assessment and Management (repouching, etc.) PROCESS - Coordination of Care X - Simple Patient / Family Education for ongoing care 1 15 []  - 0 Complex (extensive) Patient / Family Education for ongoing care []  - 0 Staff obtains Programmer, systems, Records, Test Results / Process Orders []  - 0 Staff telephones HHA, Nursing Homes / Clarify orders / etc []  - 0 Routine Transfer to another Facility (non-emergent condition) []  - 0 Routine Hospital Admission (non-emergent condition) []  - 0 New Admissions / Biomedical engineer / Ordering NPWT, Apligraf, etc. []  - 0 Emergency Hospital Admission (emergent condition) X- 1 10 Simple Discharge Coordination Buckbee, Kolsen B. ([id number]) []  - 0 Complex (extensive) Discharge Coordination PROCESS - Special Needs []  - Pediatric / Minor Patient Management 0 []  - 0 Isolation Patient Management []  - 0 Hearing / Language / Visual special needs []  - 0 Assessment of Community assistance (transportation, D/C planning, etc.) []  -  0 Additional assistance / Altered mentation []  - 0 Support Surface(s) Assessment (bed, cushion, seat, etc.) INTERVENTIONS - Wound Cleansing / Measurement X - Simple Wound Cleansing - one wound  1 5 []  - 0 Complex Wound Cleansing - multiple wounds X- 1 5 Wound Imaging (photographs - any number of wounds) []  - 0 Wound Tracing (instead of photographs) X- 1 5 Simple Wound Measurement - one wound []  - 0 Complex Wound Measurement - multiple wounds INTERVENTIONS - Wound Dressings X - Small Wound Dressing one or multiple wounds 1 10 []  - 0 Medium Wound Dressing one or multiple wounds []  - 0 Large Wound Dressing one or multiple wounds []  - 0 Application of Medications - topical []  - 0 Application of Medications - injection INTERVENTIONS - Miscellaneous []  - External ear exam 0 []  - 0 Specimen Collection (cultures, biopsies, blood, body fluids, etc.) []  - 0 Specimen(s) / Culture(s) sent or taken to Lab for analysis []  - 0 Patient Transfer (multiple staff / Civil Service fast streamer / Similar devices) []  - 0 Simple Staple / Suture removal (25 or less) []  - 0 Complex Staple / Suture removal (26 or more) []  - 0 Hypo / Hyperglycemic Management (close monitor of Blood Glucose) []  - 0 Ankle / Brachial Index (ABI) - do not check if billed separately X- 1 5 Vital Signs Goines, Darivs B. ([id number]) Has the patient been seen at the hospital within the last three years: Yes Total Score: 75 Level Of Care: New/Established - Level 2 Electronic Signature(s) Signed: 04/15/2018 5:59:54 PM By: Roger Shelter Entered By: Roger Shelter on 04/15/2018 16:22:56 Roell, [name] ([id number]) -------------------------------------------------------------------------------- Encounter Discharge Information Details Patient Name: Glenn Standard B. Date of Service: 04/15/2018 3:15 PM Medical Record Number: [id number] Patient Account Number: [id number] Date of Birth/Sex: [birth year] (62 y.o. M) Treating RN:  Roger Shelter Primary Care Cutler Sunday: Park Liter Other Clinician: Referring Darielys Giglia: Park Liter Treating Jordin Vicencio/Extender: Melburn Hake, HOYT Weeks in Treatment: 6 Encounter Discharge Information Items Discharge Condition: Stable Ambulatory Status: Cane Discharge Destination: Home Transportation: Private Auto Schedule Follow-up Appointment: Yes Clinical Summary of Care: Electronic Signature(s) Signed: 04/15/2018 5:59:54 PM By: Roger Shelter Entered By: Roger Shelter on 04/15/2018 16:23:46 Gladish, [name] ([id number]) -------------------------------------------------------------------------------- Lower Extremity Assessment Details Patient Name: Glenn Standard B. Date of Service: 04/15/2018 3:15 PM Medical Record Number: [id number] Patient Account Number: [id number] Date of Birth/Sex: [birth year] (62 y.o. M) Treating RN: Roger Shelter Primary Care Shavaughn Seidl: Park Liter Other Clinician: Referring Kolby Myung: Park Liter Treating Kewana Sanon/Extender: Melburn Hake, HOYT Weeks in Treatment: 6 Edema Assessment Assessed: [Left: No] [Right: No] Edema: [Left: Ye] [Right: s] Vascular Assessment Claudication: Claudication Assessment [Right:None] Pulses: Dorsalis Pedis Palpable: [Right:No] Doppler Audible: [Right:Inaudible] Posterior Tibial Extremity colors, hair growth, and conditions: Extremity Color: [Right:Mottled] Hair Growth on Extremity: [Right:No] Temperature of Extremity: [Right:Cold] Toe Nail Assessment Left: Right: Thick: Yes Discolored: Yes Deformed: Yes Improper Length and Hygiene: Yes Electronic Signature(s) Signed: 04/15/2018 5:59:54 PM By: Roger Shelter Entered By: Roger Shelter on 04/15/2018 16:11:44 Waide, [name] ([id number]) -------------------------------------------------------------------------------- Multi Wound Chart Details Patient Name: Glenn Standard B. Date of Service: 04/15/2018 3:15 PM Medical Record Number:  [id number] Patient Account Number: [id number] Date of Birth/Sex: 1955-12-08 (62 y.o. 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] [N/A:N/A] Comorbid History: [3:Arrhythmia, Hypertension, Peripheral Arterial Disease, Type II Diabetes, Dementia] [N/A:N/A] Date Acquired: [3:01/01/2018] [N/A:N/A] Weeks of  Treatment: [3:6] [N/A:N/A] Wound Status: [3:Open] [N/A:N/A] Pending Amputation on [3:Yes] [N/A:N/A] Presentation: Measurements L x W x D [3:0.9x0.6x0.1] [N/A:N/A] (cm) Area (cm) : [3:0.424] [N/A:N/A] Volume (cm) : [3:0.042] [N/A:N/A] % Reduction in Area: [3:-49.80%] [N/A:N/A] % Reduction in Volume: [3:-50.00%] [N/A:N/A] Classification: [3:Partial Thickness] [N/A:N/A] Exudate Amount: [3:None Present] [N/A:N/A] Wound Margin: [3:Flat and Intact] [N/A:N/A] Granulation Amount: [3:None Present (0%)] [N/A:N/A] Necrotic Amount: [3:Medium (34-66%)] [N/A:N/A] Exposed Structures: [3:Fascia: No Fat Layer (Subcutaneous Tissue) Exposed: No Tendon: No Muscle: No Joint: No Bone: No] [N/A:N/A] Epithelialization: [3:None] [N/A:N/A] Periwound Skin Texture: [3:Excoriation: No Induration: No Callus: No Crepitus: No Rash: No Scarring: No] [N/A:N/A] Periwound Skin Moisture: Maceration: No N/A N/A Dry/Scaly: No Periwound Skin Color: Mottled: Yes N/A N/A Atrophie Blanche: No Cyanosis: No Ecchymosis: No Erythema: No Hemosiderin Staining: No Pallor: No Rubor: No Temperature: No Abnormality N/A  N/A Tenderness on Palpation: Yes N/A N/A Wound Preparation: Ulcer Cleansing: N/A N/A Rinsed/Irrigated with Saline Topical Anesthetic Applied: Other: lidocaine 4% Treatment Notes Electronic Signature(s) Signed: 04/15/2018 5:59:54 PM By: Roger Shelter Entered By: Roger Shelter on 04/15/2018 16:15:35 Pasquini, [name] ([id number]) -------------------------------------------------------------------------------- [place] Details Patient Name: [name]. Date of Service: 04/15/2018 3:15 PM Medical Record Number: [id number] Patient Account Number: [id number] Date of Birth/Sex: [birth year] (62 y.o. M) Treating RN: Roger Shelter Primary Care Novalee Horsfall: Park Liter Other Clinician: Referring Anastaisa Wooding: Park Liter Treating Aysel Gilchrest/Extender: Melburn Hake, HOYT Weeks in Treatment: 6 Active Inactive ` Abuse / Safety / Falls / Self Care Management Nursing Diagnoses: Potential for falls Goals: Patient will not experience any injury related to falls Date Initiated: 03/03/2018 Target Resolution Date: 07/09/2018 Goal Status: Active Interventions: Assess Activities of Daily Living upon admission and as needed Assess fall risk on admission and as needed Assess: immobility, friction, shearing, incontinence upon admission and as needed Assess impairment of mobility on admission and as needed per policy Assess personal safety and home safety (as indicated) on admission and as needed Assess self care needs on admission and as needed Notes: ` Nutrition Nursing Diagnoses: Imbalanced nutrition Impaired glucose control: actual or potential Potential for alteratiion in Nutrition/Potential for imbalanced nutrition Goals: Patient/caregiver agrees to and verbalizes understanding of need to use nutritional supplements and/or vitamins as prescribed Date Initiated: 03/03/2018 Target Resolution Date: 07/09/2018 Goal Status: Active Patient/caregiver will maintain therapeutic  glucose control Date Initiated: 03/03/2018 Target Resolution Date: 06/11/2018 Goal Status: Active Interventions: Assess patient nutrition upon admission and as needed per policy Provide education on elevated blood sugars and impact on wound healing Provide education on nutrition IRENE, MITCHAM ([id number]) Notes: ` Orientation to the Wound Care Program Nursing Diagnoses: Knowledge deficit related to the wound healing center program Goals: Patient/caregiver will verbalize understanding of the [place] Date Initiated: 03/03/2018 Target Resolution Date: 04/09/2018 Goal Status: Active Interventions: Provide education on orientation to the wound center Notes: ` Wound/Skin Impairment Nursing Diagnoses: Impaired tissue integrity Knowledge deficit related to ulceration/compromised skin integrity Goals: Ulcer/skin breakdown will have a volume reduction of 80% by week 12 Date Initiated: 03/03/2018 Target Resolution Date: 07/02/2018 Goal Status: Active Interventions: Assess patient/caregiver ability to perform ulcer/skin care regimen upon admission and as needed Assess ulceration(s) every visit Notes: Electronic Signature(s) Signed: 04/15/2018 5:59:54 PM By: Roger Shelter Entered By: Roger Shelter on 04/15/2018 16:15:29 Garver, [name] ([id number]) -------------------------------------------------------------------------------- Pain Assessment Details Patient Name: Glenn Standard B. Date of Service: 04/15/2018 3:15 PM Medical Record Number: [id number] Patient Account Number: [id number] Date of Birth/Sex: 10/23/1955 (62 y.o. M) Treating RN: Montey Hora Primary  Care Woodard Perrell: Park Liter Other Clinician: Referring Ceaser Ebeling: Park Liter Treating Keane Martelli/Extender: Melburn Hake, HOYT Weeks in Treatment: 6 Active Problems Location of Pain Severity and Description of Pain Patient Has Paino Yes Site Locations Rate the pain. Current Pain Level: 8 Character  of Pain Describe the Pain: Shooting, Stabbing, Throbbing Pain Management and Medication Current Pain Management: Medication: Yes Electronic Signature(s) Signed: 04/15/2018 4:56:18 PM By: Lorine Bears RCP, RRT, CHT Signed: 04/18/2018 4:11:20 PM By: Montey Hora Entered By: Lorine Bears on 04/15/2018 15:42:02 Bauch, [name] ([id number]) -------------------------------------------------------------------------------- Patient/Caregiver Education Details Patient Name: [name]. Date of Service: 04/15/2018 3:15 PM Medical Record Number: [id number] Patient Account Number: [id number] Date of Birth/Gender: 02/13/1956 (62 y.o. M) Treating RN: Roger Shelter Primary Care Physician: Park Liter Other Clinician: Referring Physician: Park Liter Treating Physician/Extender: Sharalyn Ink in Treatment: 6 Education Assessment Education Provided To: Patient Education Topics Provided Wound/Skin Impairment: Handouts: Caring for Your Ulcer Methods: Explain/Verbal Responses: State content correctly Electronic Signature(s) Signed: 04/15/2018 5:59:54 PM By: Roger Shelter Entered By: Roger Shelter on 04/15/2018 16:23:56 Liptak, [name] ([id number]) -------------------------------------------------------------------------------- Wound Assessment Details Patient Name: Glenn Standard B. Date of Service: 04/15/2018 3:15 PM Medical Record Number: [id number] Patient Account Number: [id number] Date of Birth/Sex: May 25, 1956 (62 y.o. M) Treating RN: Roger Shelter Primary Care Barak Bialecki: Park Liter Other Clinician: Referring Charlcie Prisco: Park Liter Treating Talah Cookston/Extender: Melburn Hake, HOYT Weeks in Treatment: 6 Wound Status Wound Number: 3 Primary Arterial Insufficiency Ulcer Etiology: Wound Location: Right Toe Great Wound Open Wounding Event: Gradually Appeared Status: Date Acquired: 01/01/2018 Comorbid Arrhythmia, Hypertension,  Peripheral Arterial Weeks Of Treatment: 6 History: Disease, Type II Diabetes, Dementia Clustered Wound: No Pending Amputation On Presentation Photos Photo Uploaded By: Roger Shelter on 04/15/2018 17:54:45 Wound Measurements Length: (cm) 0.9 Width: (cm) 0.6 Depth: (cm) 0.1 Area: (cm) 0.424 Volume: (cm) 0.042 % Reduction in Area: -49.8% % Reduction in Volume: -50% Epithelialization: None Tunneling: No Undermining: No Wound Description Classification: Partial Thickness Wound Margin: Flat and Intact Exudate Amount: None Present Foul Odor After Cleansing: No Slough/Fibrino No Wound Bed Granulation Amount: None Present (0%) Exposed Structure Necrotic Amount: Medium (34-66%) Fascia Exposed: No Necrotic Quality: Adherent Slough Fat Layer (Subcutaneous Tissue) Exposed: No Tendon Exposed: No Muscle Exposed: No Joint Exposed: No Bone Exposed: No Periwound Skin Texture Texture Color Royse, Cordale B. ([id number]) No Abnormalities Noted: No No Abnormalities Noted: No Callus: No Atrophie Blanche: No Crepitus: No Cyanosis: No Excoriation: No Ecchymosis: No Induration: No Erythema: No Rash: No Hemosiderin Staining: No Scarring: No Mottled: Yes Pallor: No Moisture Rubor: No No Abnormalities Noted: No Dry / Scaly: No Temperature / Pain Maceration: No Temperature: No Abnormality Tenderness on Palpation: Yes Wound Preparation Ulcer Cleansing: Rinsed/Irrigated with Saline Topical Anesthetic Applied: Other: lidocaine 4%, Treatment Notes Wound #3 (Right Toe Great) 1. Cleansed with: Clean wound with Normal Saline 2. Anesthetic Topical Lidocaine 4% cream to wound bed prior to debridement Notes betadine Electronic Signature(s) Signed: 04/15/2018 5:59:54 PM By: Roger Shelter Entered By: Roger Shelter on 04/15/2018 16:09:03 Mikrut, [name] ([id number]) -------------------------------------------------------------------------------- Vitals Details Patient  Name: Glenn Standard B. Date of Service: 04/15/2018 3:15 PM Medical Record Number: [id number] Patient Account Number: [id number] Date of Birth/Sex: January 20, 1956 (62 y.o. M) Treating RN: Montey Hora Primary Care Abrahan Fulmore: Park Liter Other Clinician: Referring May Ozment: Park Liter Treating Danzel Marszalek/Extender: Melburn Hake, HOYT Weeks in Treatment: 6 Vital Signs Time Taken: 14:45 Temperature (F): 98.2 Height (in): 70 Pulse (bpm): 76 Weight (lbs): 185 Respiratory Rate (breaths/min): 16 Body Mass Index (  BMI): 26.5 Blood Pressure (mmHg): 126/66 Reference Range: 80 - 120 mg / dl Electronic Signature(s) Signed: 04/15/2018 4:56:18 PM By: Lorine Bears RCP, RRT, CHT Entered By: Becky Sax, Amado Nash on 04/15/2018 15:47:21

## 2018-04-21 ENCOUNTER — Telehealth: Payer: Self-pay | Admitting: Family Medicine

## 2018-04-21 ENCOUNTER — Encounter: Payer: Self-pay | Admitting: Anesthesiology

## 2018-04-21 ENCOUNTER — Telehealth: Payer: Self-pay | Admitting: Gastroenterology

## 2018-04-21 ENCOUNTER — Ambulatory Visit: Payer: Medicare HMO | Admitting: Anesthesiology

## 2018-04-21 ENCOUNTER — Other Ambulatory Visit: Payer: Self-pay

## 2018-04-21 ENCOUNTER — Encounter
Admission: RE | Disposition: A | Discharge status: Home / Self Care | Payer: Self-pay | Source: Ambulatory Visit | Attending: Gastroenterology

## 2018-04-21 ENCOUNTER — Ambulatory Visit
Admission: RE | Admit: 2018-04-21 | Discharge: 2018-04-21 | Disposition: A | Discharge status: Home / Self Care | Payer: Medicare HMO | Source: Ambulatory Visit | Attending: Gastroenterology | Admitting: Gastroenterology

## 2018-04-21 DIAGNOSIS — D509 Iron deficiency anemia, unspecified: Secondary | ICD-10-CM

## 2018-04-21 DIAGNOSIS — E119 Type 2 diabetes mellitus without complications: Secondary | ICD-10-CM | POA: Diagnosis not present

## 2018-04-21 DIAGNOSIS — Z7901 Long term (current) use of anticoagulants: Secondary | ICD-10-CM | POA: Insufficient documentation

## 2018-04-21 DIAGNOSIS — Z7984 Long term (current) use of oral hypoglycemic drugs: Secondary | ICD-10-CM | POA: Diagnosis not present

## 2018-04-21 DIAGNOSIS — Z87891 Personal history of nicotine dependence: Secondary | ICD-10-CM | POA: Diagnosis not present

## 2018-04-21 DIAGNOSIS — Z5309 Procedure and treatment not carried out because of other contraindication: Secondary | ICD-10-CM | POA: Diagnosis not present

## 2018-04-21 DIAGNOSIS — I10 Essential (primary) hypertension: Secondary | ICD-10-CM | POA: Diagnosis not present

## 2018-04-21 SURGERY — ESOPHAGOGASTRODUODENOSCOPY (EGD) WITH PROPOFOL
Anesthesia: General

## 2018-04-21 MED ORDER — MIDAZOLAM HCL 2 MG/2ML IJ SOLN
INTRAMUSCULAR | Status: AC
  Filled 2018-04-21: qty 2

## 2018-04-21 MED ORDER — SODIUM CHLORIDE 0.9 % IV SOLN: INTRAVENOUS | Status: DC

## 2018-04-21 MED ORDER — LIDOCAINE HCL (PF) 2 % IJ SOLN
INTRAMUSCULAR | Status: AC
  Filled 2018-04-21: qty 10

## 2018-04-21 MED ORDER — PROPOFOL 500 MG/50ML IV EMUL
INTRAVENOUS | Status: AC
  Filled 2018-04-21: qty 50

## 2018-04-21 MED ORDER — FENTANYL CITRATE (PF) 100 MCG/2ML IJ SOLN
INTRAMUSCULAR | Status: AC
  Filled 2018-04-21: qty 2

## 2018-04-21 MED ORDER — NA SULFATE-K SULFATE-MG SULF 17.5-3.13-1.6 GM/177ML PO SOLN: 1.0000 | Freq: Once | ORAL | 0 refills | Status: AC

## 2018-04-21 NOTE — Telephone Encounter (Signed)
Would not recommend holding Eliquis for 3-4 days given his recurrent stroke.

## 2018-04-21 NOTE — Telephone Encounter (Signed)
Per patient wife Dr Vicente Males did not feel 1 day holding eliquis was sufficient time given the risk of removing polyps and the patient procedure was cancelled.  New date is for 9/26.  Please call patient wife  to re advise.

## 2018-04-21 NOTE — Telephone Encounter (Signed)
Discussed with Romualdo Bolk, RN at [facility] the need for patient to stop blood thinner 2 days prior.  She said that PA-C Katherine Roan said patient may stop Eliquis 1-2 days prior to procedure.  Contacted patient and advised him to stop Eliquis 2 days before his procedure and he has been rescheduled for 04/28/18.  Thanks [employer]

## 2018-04-21 NOTE — Telephone Encounter (Signed)
Spoke with patients wife per release form. She states that patients procedure was canceled due to recommendations of holding Eliquis for only 1 day. Advised that I would need to send this to provider who completed clearance and would also cc Dr. Vicente Males as well and would call her with any update. She verbalized understanding with no further questions at this time.

## 2018-04-21 NOTE — Telephone Encounter (Signed)
Spoke with patients wife per release form and she states that the nurse came out and told them that he had not held his medication for 3-4 days and that is why they needed to reschedule. Reviewed communications with both providers and discussion of doses to hold. Instructed her to contact Dr. Georgeann Oppenheim office in order to get specific instructions on what to hold and how long. She verbalized understanding with no further questions.

## 2018-04-21 NOTE — Telephone Encounter (Signed)
Verbal from Dr.Johnson that this needs to be handled by cardiology, patients wife notified.

## 2018-04-21 NOTE — Telephone Encounter (Signed)
Copied from [place] [id number]. Topic: General - Other >> Apr 21, 2018  9:49 AM Lennox Solders wrote: Reason for CRM: pt wife is calling his colonoscopy has been reschedule to 04-28-18 due to pt is on eliquis and he did not come off of medication soon enough. Please call wife back today. Pt wife would like to know how many days to stop eliquis to avoid having to rsc colonoscopy again

## 2018-04-21 NOTE — Telephone Encounter (Signed)
It does appear there was some miscommunication. Patient was advised to hold Eliquis for 1 day per discussion with clinical pharmacist given his recurrent strokes. It appears he did not skip any days. We would prefer only one skipped day given his recurrent strokes, though if GI needs this to be held for 2 days prior then he may have to do so. However, this would increase his stroke risk some more. Both the patient and his wife were made aware of the increased stroke risk at his appointment when I saw them. We should aim to minimize time off anticoagulation as much as possible.

## 2018-04-21 NOTE — Telephone Encounter (Signed)
Please advise 

## 2018-04-21 NOTE — Telephone Encounter (Signed)
When should patient stop taking the eliqouis.... prior to procedure? pls call

## 2018-04-21 NOTE — Anesthesia Preprocedure Evaluation (Signed)
Anesthesia Evaluation    Airway        Dental   Pulmonary former smoker,           Cardiovascular hypertension, Pt. on medications and Pt. on home beta blockers      Neuro/Psych CVA    GI/Hepatic   Endo/Other  diabetes, Type 2, Oral Hypoglycemic Agents  Renal/GU Renal InsufficiencyRenal disease     Musculoskeletal   Abdominal   Peds  Hematology  (+) anemia ,   Anesthesia Other Findings   Reproductive/Obstetrics                             Anesthesia Physical Anesthesia Plan Anesthesia Quick Evaluation

## 2018-04-22 ENCOUNTER — Encounter: Payer: Self-pay | Admitting: Family Medicine

## 2018-04-23 ENCOUNTER — Other Ambulatory Visit: Payer: Self-pay | Admitting: Family Medicine

## 2018-04-23 MED ORDER — SULFAMETHOXAZOLE-TRIMETHOPRIM 800-160 MG PO TABS: 1.0000 | ORAL_TABLET | Freq: Two times a day (BID) | ORAL | 0 refills | Status: DC

## 2018-04-25 NOTE — Telephone Encounter (Signed)
S/w patient's wife. She verbalized understanding that it is preferred for patient to hold Eliquis 1 day but that could hold up to 2 days prior to procedure is preferred by GI. She is concerned because patient's foot is getting worse. She mentioned patient's PCP mentioned lovenox injections.  Advised her to call there or Dr Vicente Males if needed for additional advice and to call us if needed.

## 2018-04-25 NOTE — Telephone Encounter (Signed)
Patient wife Neoma Laming calling Wants to discuss and clarify information for holding Eliquis States was told to only hold 1 day by R. Dunn but Dr. Vicente Males was requesting longer Patient wife concerned Please call to discuss

## 2018-04-27 ENCOUNTER — Encounter: Payer: Self-pay | Admitting: *Deleted

## 2018-04-27 ENCOUNTER — Ambulatory Visit: Payer: Medicare HMO | Admitting: Anesthesiology

## 2018-04-28 ENCOUNTER — Encounter
Admission: RE | Disposition: A | Discharge status: Home / Self Care | Payer: Self-pay | Source: Ambulatory Visit | Attending: Gastroenterology

## 2018-04-28 ENCOUNTER — Encounter: Payer: Self-pay | Admitting: *Deleted

## 2018-04-28 ENCOUNTER — Ambulatory Visit
Admission: RE | Admit: 2018-04-28 | Discharge: 2018-04-28 | Disposition: A | Discharge status: Home / Self Care | Payer: Medicare HMO | Source: Ambulatory Visit | Attending: Gastroenterology | Admitting: Gastroenterology

## 2018-04-28 DIAGNOSIS — E785 Hyperlipidemia, unspecified: Secondary | ICD-10-CM | POA: Diagnosis not present

## 2018-04-28 DIAGNOSIS — Z87891 Personal history of nicotine dependence: Secondary | ICD-10-CM | POA: Diagnosis not present

## 2018-04-28 DIAGNOSIS — D509 Iron deficiency anemia, unspecified: Secondary | ICD-10-CM | POA: Diagnosis not present

## 2018-04-28 DIAGNOSIS — I6930 Unspecified sequelae of cerebral infarction: Secondary | ICD-10-CM | POA: Insufficient documentation

## 2018-04-28 DIAGNOSIS — H548 Legal blindness, as defined in USA: Secondary | ICD-10-CM | POA: Insufficient documentation

## 2018-04-28 DIAGNOSIS — I10 Essential (primary) hypertension: Secondary | ICD-10-CM | POA: Diagnosis not present

## 2018-04-28 DIAGNOSIS — Z539 Procedure and treatment not carried out, unspecified reason: Secondary | ICD-10-CM | POA: Insufficient documentation

## 2018-04-28 DIAGNOSIS — E1151 Type 2 diabetes mellitus with diabetic peripheral angiopathy without gangrene: Secondary | ICD-10-CM | POA: Diagnosis not present

## 2018-04-28 SURGERY — COLONOSCOPY WITH PROPOFOL
Anesthesia: General

## 2018-04-28 MED ORDER — SODIUM CHLORIDE 0.9 % IV SOLN: INTRAVENOUS | Status: DC

## 2018-04-28 NOTE — Anesthesia Preprocedure Evaluation (Signed)
Anesthesia Evaluation  Patient identified by MRN, date of birth, ID band Patient awake    Reviewed: Allergy & Precautions, H&P , NPO status , reviewed documented beta blocker date and time   Airway Mallampati: II  TM Distance: >3 FB Neck ROM: full    Dental  (+) Poor Dentition, Chipped, Missing, Edentulous Upper   Pulmonary former smoker,     + decreased breath sounds      Cardiovascular hypertension, + Peripheral Vascular Disease  Normal cardiovascular exam  Notes recorded by Rise Mu, PA-C on 04/08/2018 at 7:04 AM EDT Echo showed normal EF (pump function) with normal wall motion and normal relaxation of the heart. There was mild mitral regurgitation. Pressure inside the lungs was normal. 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  He will be moderate to high risk for upcoming noncardiac surgery. If needed, please draft a letter indicating this for me to sign and fax to performing physician.  We will need to schedule him for a CTA aorta at his convenience to further evaluate his dilated aortic root and ascending aorta.   Neuro/Psych  Headaches,  Neuromuscular disease CVA, Residual Symptoms    GI/Hepatic   Endo/Other  diabetes  Renal/GU Renal disease     Musculoskeletal   Abdominal   Peds  Hematology  (+) anemia ,   Anesthesia Other Findings Past Medical History: No date: Anemia No date: Diabetes mellitus with complication ([name]) No date: History of hernia repair No date: Hyperlipidemia No date: Hypertension No date: Legally blind No date: PAF (paroxysmal atrial fibrillation) ([name])     Comment:  a. on Eliquis as of 2018; b. CHADS2VASc => 5 (HTN, DM,               stroke x 2, vascular disease) No date: Peripheral vascular disease ([place])     Comment:  a. followed by Dr. Lucky Cowboy; b. s/p kissing balloon stents               and right  external iliac stent in 10/2017; c. LE               angiogrpahy 01/2018: No significant arterial occlusive               disease in the lower extremities. No date: Stroke [facility])     Comment:  a. 2016 & 2018  Past Surgical History: No date: APPENDECTOMY No date: HERNIA REPAIR     Comment:  UMBILICAL 0/25/8527: LOWER EXTREMITY ANGIOGRAPHY; Right     Comment:  Procedure: LOWER EXTREMITY ANGIOGRAPHY;  Surgeon: [name], MD;  Location: [place] [facility];  Service:               Cardiovascular;  Laterality: Right; 01/13/2018: LOWER EXTREMITY ANGIOGRAPHY; Right     Comment:  Procedure: LOWER EXTREMITY ANGIOGRAPHY;  Surgeon: [name], MD;  Location: [place] [facility];  Service:               Cardiovascular;  Laterality: Right; 10/25/2017: LOWER EXTREMITY INTERVENTION     Comment:  Procedure: LOWER EXTREMITY INTERVENTION;  Surgeon: [name], MD;  Location: [place] [facility];  Service:               Cardiovascular;; No date: TONSILLECTOMY  Gangrenous R foot - awaiting further surgical eval     Reproductive/Obstetrics                             Anesthesia Physical Anesthesia Plan  ASA: IV  Anesthesia Plan: General   Post-op Pain Management:    Induction: Intravenous  PONV Risk Score and Plan: 2 and Treatment may vary due to age or medical condition and TIVA  Airway Management Planned: Nasal Cannula and Natural Airway  Additional Equipment:   Intra-op Plan:   Post-operative Plan:   Informed Consent: I have reviewed the patients History and Physical, chart, labs and discussed the procedure including the risks, benefits and alternatives for the proposed anesthesia with the patient or authorized representative who has indicated his/her understanding and acceptance.   Dental Advisory Given  Plan Discussed with:   Anesthesia Plan Comments:         Anesthesia Quick Evaluation

## 2018-04-29 ENCOUNTER — Ambulatory Visit: Payer: Medicare HMO | Admitting: Physician Assistant

## 2018-05-02 ENCOUNTER — Other Ambulatory Visit: Payer: Self-pay

## 2018-05-02 ENCOUNTER — Encounter: Payer: Self-pay | Admitting: Family Medicine

## 2018-05-02 ENCOUNTER — Ambulatory Visit: Payer: Medicare HMO | Admitting: Gastroenterology

## 2018-05-02 ENCOUNTER — Telehealth: Payer: Self-pay

## 2018-05-02 DIAGNOSIS — D509 Iron deficiency anemia, unspecified: Secondary | ICD-10-CM

## 2018-05-02 NOTE — Telephone Encounter (Signed)
Called pt wife Neoma Laming regarding rescheduling Mr. Massett's EGD/Colonoscopy procedure.  LVM to return call

## 2018-05-02 NOTE — Telephone Encounter (Signed)
Pt wide left vm to schedule colonosocpy

## 2018-05-02 NOTE — Telephone Encounter (Signed)
-----   Message from [name], MD sent at 04/28/2018  7:55 AM EDT ----- Sherald Hess  He was not cleaned out- reschedule with a 2 day prep

## 2018-05-02 NOTE — Telephone Encounter (Signed)
Called pt to discuss rescheduling colonoscopy. LVM to return call

## 2018-05-03 ENCOUNTER — Other Ambulatory Visit: Payer: Self-pay | Admitting: Family Medicine

## 2018-05-03 MED ORDER — SULFAMETHOXAZOLE-TRIMETHOPRIM 800-160 MG PO TABS: 1.0000 | ORAL_TABLET | Freq: Two times a day (BID) | ORAL | 0 refills | Status: DC

## 2018-05-05 ENCOUNTER — Other Ambulatory Visit: Payer: Self-pay | Admitting: Family Medicine

## 2018-05-05 DIAGNOSIS — I96 Gangrene, not elsewhere classified: Secondary | ICD-10-CM | POA: Diagnosis not present

## 2018-05-05 NOTE — Telephone Encounter (Signed)
Copied from [place] [phone]. Topic: Quick Communication - Rx Refill/Question >> May 05, 2018 12:56 PM Sheran Luz wrote: Medication: oxyCODONE-acetaminophen (PERCOCET) 10-325 MG tablet  Has the patient contacted their pharmacy? Yes, advised to contact office.   Preferred Pharmacy (with phone number or street name): [id number] - [place], [place] [address] [phone] (Phone) [phone] (Fax)

## 2018-05-05 NOTE — Telephone Encounter (Signed)
Requested medication (s) are due for refill today: yes  Requested medication (s) are on the active medication list: yes  Last refill:  04/13/18  Future visit scheduled: yes  Notes to clinic:  undelegated    Requested Prescriptions  Pending Prescriptions Disp Refills   oxyCODONE-acetaminophen (PERCOCET) 10-325 MG tablet 90 tablet 0    Sig: Take 1 tablet by mouth every 8 (eight) hours as needed for pain.     Not Delegated - Analgesics:  Opioid Agonist Combinations Failed - 05/05/2018  1:04 PM      Failed - This refill cannot be delegated      Passed - Urine Drug Screen completed in last 360 days.      Passed - Valid encounter within last 6 months    Recent Outpatient Visits          3 weeks ago Necrotic toes [facility])   [facility] Merrie Roof [place], [place]   4 weeks ago Anemia, unspecified type   Happy, [place], PA-C   1 month ago Pre-op examination   [place], Megan P, DO   1 month ago Type 2 diabetes mellitus with complication, unspecified whether long term insulin use [facility])   [facility] Trinna Post, PA-C   1 month ago Necrotic toes [facility])   [place], Lilia Argue, [place]      Future Appointments            Tomorrow Wynetta Emery, Barb Merino, DO [facility], [place]   In 3 weeks [name], [place]

## 2018-05-06 ENCOUNTER — Other Ambulatory Visit: Payer: Self-pay

## 2018-05-06 ENCOUNTER — Encounter: Payer: Self-pay | Admitting: Family Medicine

## 2018-05-06 ENCOUNTER — Ambulatory Visit (INDEPENDENT_AMBULATORY_CARE_PROVIDER_SITE_OTHER): Payer: Medicare HMO | Admitting: Family Medicine

## 2018-05-06 VITALS — BP 134/71 | HR 82 | Temp 98.1°F | Ht 70.0 in | Wt 185.0 lb

## 2018-05-06 DIAGNOSIS — I70232 Atherosclerosis of native arteries of right leg with ulceration of calf: Secondary | ICD-10-CM | POA: Diagnosis not present

## 2018-05-06 DIAGNOSIS — I96 Gangrene, not elsewhere classified: Secondary | ICD-10-CM

## 2018-05-06 DIAGNOSIS — E1142 Type 2 diabetes mellitus with diabetic polyneuropathy: Secondary | ICD-10-CM | POA: Diagnosis not present

## 2018-05-06 DIAGNOSIS — D500 Iron deficiency anemia secondary to blood loss (chronic): Secondary | ICD-10-CM | POA: Diagnosis not present

## 2018-05-06 DIAGNOSIS — D649 Anemia, unspecified: Secondary | ICD-10-CM | POA: Diagnosis not present

## 2018-05-06 LAB — BAYER DCA HB A1C WAIVED: HB A1C (BAYER DCA - WAIVED): 7.2 % — ABNORMAL HIGH (ref <7.0)

## 2018-05-06 MED ORDER — APIXABAN 5 MG PO TABS: 5.0000 mg | ORAL_TABLET | Freq: Two times a day (BID) | ORAL | 1 refills | Status: DC

## 2018-05-06 MED ORDER — OXYCODONE-ACETAMINOPHEN 10-325 MG PO TABS: 1.0000 | ORAL_TABLET | Freq: Four times a day (QID) | ORAL | 0 refills | Status: DC | PRN

## 2018-05-06 MED ORDER — METFORMIN HCL 1000 MG PO TABS: 1000.0000 mg | ORAL_TABLET | Freq: Two times a day (BID) | ORAL | 3 refills | Status: DC

## 2018-05-06 MED ORDER — EMPAGLIFLOZIN 10 MG PO TABS: 5.0000 mg | ORAL_TABLET | Freq: Every day | ORAL | 1 refills | Status: DC

## 2018-05-06 NOTE — Progress Notes (Signed)
BP 134/71   Pulse 82   Temp 98.1 F (36.7 C) (Oral)   Ht 5\' 10"  (1.778 m)   Wt 185 lb (83.9 kg)   SpO2 97%   BMI 26.54 kg/m    Subjective:    Patient ID: Glenn Gerold., male    DOB: 1955/08/13, 62 y.o.   MRN: [id number]  HPI: Glenn Cocuzza. is a 62 y.o. male  Chief Complaint  Patient presents with  . Foot Pain    f/u right foot   Was supposed to have colonoscopy done last week to find out why he is having GI bleeding and evaluate iron deficiency prior to him being able to have his toes amputated. He was not cleaned out for the colonoscopy so it was canceled. It is rescheduled for next week.   He is here today for pain management for multiple necrotic toes on the R foot. He is supposed to have his toes amputated, but was found to have an acute GI bleed. Getting his colonoscopy has been difficult and it hasn't been done yet. His pain is not doing particularly well. He is on oxycodone TID until the amputation. Now has an ulcer on his R leg.   DIABETES Hypoglycemic episodes:no Polydipsia/polyuria: no Visual disturbance: no Chest pain: no Paresthesias: no Glucose Monitoring: no Taking Insulin?: no Blood Pressure Monitoring: not checking Retinal Examination: Not up to Date Foot Exam: Up to Date Diabetic Education: Completed Pneumovax: Up to Date Influenza: Up to Date Aspirin: no  Relevant past medical, surgical, family and social history reviewed and updated as indicated. Interim medical history since our last visit reviewed. Allergies and medications reviewed and updated.  Review of Systems  Constitutional: Negative.   Respiratory: Negative.   Cardiovascular: Negative.   Musculoskeletal: Positive for gait problem and myalgias. Negative for arthralgias, back pain, joint swelling, neck pain and neck stiffness.  Skin: Positive for wound. Negative for color change, pallor and rash.  Psychiatric/Behavioral: Positive for confusion. Negative for agitation,  behavioral problems, decreased concentration, dysphoric mood, hallucinations, self-injury, sleep disturbance and suicidal ideas. The patient is not nervous/anxious and is not hyperactive.     Per HPI unless specifically indicated above     Objective:    BP 134/71   Pulse 82   Temp 98.1 F (36.7 C) (Oral)   Ht 5\' 10"  (1.778 m)   Wt 185 lb (83.9 kg)   SpO2 97%   BMI 26.54 kg/m   Wt Readings from Last 3 Encounters:  05/06/18 185 lb (83.9 kg)  04/21/18 165 lb (74.8 kg)  04/13/18 191 lb 3.2 oz (86.7 kg)    Physical Exam  Constitutional: He is oriented to person, place, and time. He appears well-developed and well-nourished. No distress.  HENT:  Head: Normocephalic and atraumatic.  Right Ear: Hearing normal.  Left Ear: Hearing normal.  Nose: Nose normal.  Eyes: Pupils are equal, round, and reactive to light. Conjunctivae, EOM and lids are normal. Right eye exhibits no discharge. Left eye exhibits no discharge. No scleral icterus.  Cardiovascular: Normal rate, regular rhythm, normal heart sounds and intact distal pulses. Exam reveals no gallop and no friction rub.  No murmur heard. Pulmonary/Chest: Effort normal and breath sounds normal. No stridor. No respiratory distress. He has no wheezes. He has no rales. He exhibits no tenderness.  Musculoskeletal: Normal range of motion.  Neurological: He is alert and oriented to person, place, and time.  Skin: Skin is warm, dry and intact. Capillary refill  takes less than 2 seconds. No rash noted. He is not diaphoretic. No erythema. No pallor.  Ulcer on the anterior portion of R leg. Necrotic toes on R foot  Psychiatric: He has a normal mood and affect. His speech is normal and behavior is normal. Judgment and thought content normal. Cognition and memory are normal.  Nursing note and vitals reviewed.   Results for orders placed or performed in visit on 05/06/18  CBC with Differential/Platelet  Result Value Ref Range   WBC 10.4 3.4 - 10.8  x10E3/uL   RBC 4.48 4.14 - 5.80 x10E6/uL   Hemoglobin 11.0 (L) 13.0 - 17.7 g/dL   Hematocrit 34.2 (L) 37.5 - 51.0 %   MCV 76 (L) 79 - 97 fL   MCH 24.6 (L) 26.6 - 33.0 pg   MCHC 32.2 31.5 - 35.7 g/dL   RDW 14.0 12.3 - 15.4 %   Platelets 309 150 - 450 x10E3/uL   Neutrophils 71 Not Estab. %   Lymphs 17 Not Estab. %   Monocytes 8 Not Estab. %   Eos 3 Not Estab. %   Basos 0 Not Estab. %   Neutrophils Absolute 7.4 (H) 1.4 - 7.0 x10E3/uL   Lymphocytes Absolute 1.8 0.7 - 3.1 x10E3/uL   Monocytes Absolute 0.8 0.1 - 0.9 x10E3/uL   EOS (ABSOLUTE) 0.3 0.0 - 0.4 x10E3/uL   Basophils Absolute 0.0 0.0 - 0.2 x10E3/uL   Immature Granulocytes 1 Not Estab. %   Immature Grans (Abs) 0.1 0.0 - 0.1 x10E3/uL  Iron and TIBC  Result Value Ref Range   Total Iron Binding Capacity 389 250 - 450 ug/dL   UIBC 372 (H) 111 - 343 ug/dL   Iron 17 (L) 38 - 169 ug/dL   Iron Saturation 4 (LL) 15 - 55 %  Ferritin  Result Value Ref Range   Ferritin 19 (L) 30 - 400 ng/mL  Comprehensive metabolic panel  Result Value Ref Range   Glucose 113 (H) 65 - 99 mg/dL   BUN 11 8 - 27 mg/dL   Creatinine, Ser 0.67 (L) 0.76 - 1.27 mg/dL   GFR calc non Af Amer 102 >59 mL/min/1.73   GFR calc Af Amer 118 >59 mL/min/1.73   BUN/Creatinine Ratio 16 10 - 24   Sodium 139 134 - 144 mmol/L   Potassium 4.4 3.5 - 5.2 mmol/L   Chloride 97 96 - 106 mmol/L   CO2 22 20 - 29 mmol/L   Calcium 9.7 8.6 - 10.2 mg/dL   Total Protein 7.1 6.0 - 8.5 g/dL   Albumin 4.2 3.6 - 4.8 g/dL   Globulin, Total 2.9 1.5 - 4.5 g/dL   Albumin/Globulin Ratio 1.4 1.2 - 2.2   Bilirubin Total 0.2 0.0 - 1.2 mg/dL   Alkaline Phosphatase 82 39 - 117 IU/L   AST 15 0 - 40 IU/L   ALT 13 0 - 44 IU/L  Bayer DCA Hb A1c Waived  Result Value Ref Range   HB A1C (BAYER DCA - WAIVED) 7.2 (H) <7.0 %      Assessment & Plan:   Problem List Items Addressed This Visit      Cardiovascular and Mediastinum   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 Known necrotic toes. Had acute GI bleed, so amputation was held off. On iron and hemoglobin as been holding stable, but still low. Has seen GI, but was not able to have  colonoscopy because he was not cleared out. Now to be having repeat colonoscopy next week. Foot appears to be getting worse, and now with ulcer on R leg. Note from podiatry not available at this time, but he is to follow up with vascular ASAP, unclear if podiatry is willing to do his surgery at this time.       Relevant Medications   apixaban (ELIQUIS) 5 MG TABS tablet     Endocrine   Diabetic peripheral neuropathy ([name]) - Primary    A1c elevated at 7.2. Will continue current regimen right now. Needs to have surgery, then we will deal with this after surgery.      Relevant Medications   empagliflozin (JARDIANCE) 10 MG TABS tablet   metFORMIN (GLUCOPHAGE) 1000 MG tablet   Other Relevant Orders   Comprehensive metabolic panel (Completed)   Bayer DCA Hb A1c Waived (Completed)     Other   Necrotic toes ([name])    Not doing well. Had acute GI bleed, so amputation was held off. On iron and hemoglobin as been holding stable, but still low. Has seen GI, but was not able to have colonoscopy because he was not cleared out. Now to be having repeat colonoscopy next week. Foot appears to be getting worse, and now with ulcer on R leg. Note from podiatry not available at this time, but he is to follow up with vascular ASAP, unclear if podiatry is willing to do his surgery at this time. Significant concern for worsening of his condition and need for further amputation if we are not able to get this done sooner rather than later. We will see about getting him into hematology for ? Iron infusion/clearance to try to get him his surgery ASAP.      Iron deficiency anemia due to chronic blood loss    Stable at 11.0- but still low. Iron deficiency Anemia- colonoscopy pending 05/12/18.  Cannot come off eliquis due to a fib and recurrent strokes. Needs to have amputation for necrotic toes- now with leg ulcer, possibly needs higher amputation. ?iron infusion so he can have his surgery vs. Clearance that he can have his surgery.      Relevant Orders   CBC with Differential/Platelet (Completed)   Iron and TIBC (Completed)   Ferritin (Completed)       Follow up plan: Return in about 2 weeks (around 05/20/2018).

## 2018-05-07 LAB — CBC WITH DIFFERENTIAL/PLATELET
BASOS ABS: 0 10*3/uL (ref 0.0–0.2)
Basos: 0 %
EOS (ABSOLUTE): 0.3 10*3/uL (ref 0.0–0.4)
EOS: 3 %
HEMOGLOBIN: 11 g/dL — AB (ref 13.0–17.7)
Hematocrit: 34.2 % — ABNORMAL LOW (ref 37.5–51.0)
IMMATURE GRANS (ABS): 0.1 10*3/uL (ref 0.0–0.1)
Immature Granulocytes: 1 %
LYMPHS ABS: 1.8 10*3/uL (ref 0.7–3.1)
LYMPHS: 17 %
MCH: 24.6 pg — ABNORMAL LOW (ref 26.6–33.0)
MCHC: 32.2 g/dL (ref 31.5–35.7)
MCV: 76 fL — ABNORMAL LOW (ref 79–97)
MONOCYTES: 8 %
Monocytes Absolute: 0.8 10*3/uL (ref 0.1–0.9)
NEUTROS ABS: 7.4 10*3/uL — AB (ref 1.4–7.0)
Neutrophils: 71 %
Platelets: 309 10*3/uL (ref 150–450)
RBC: 4.48 x10E6/uL (ref 4.14–5.80)
RDW: 14 % (ref 12.3–15.4)
WBC: 10.4 10*3/uL (ref 3.4–10.8)

## 2018-05-07 LAB — COMPREHENSIVE METABOLIC PANEL
ALT: 13 IU/L (ref 0–44)
AST: 15 IU/L (ref 0–40)
Albumin/Globulin Ratio: 1.4 (ref 1.2–2.2)
Albumin: 4.2 g/dL (ref 3.6–4.8)
Alkaline Phosphatase: 82 IU/L (ref 39–117)
BILIRUBIN TOTAL: 0.2 mg/dL (ref 0.0–1.2)
BUN/Creatinine Ratio: 16 (ref 10–24)
BUN: 11 mg/dL (ref 8–27)
CHLORIDE: 97 mmol/L (ref 96–106)
CO2: 22 mmol/L (ref 20–29)
Calcium: 9.7 mg/dL (ref 8.6–10.2)
Creatinine, Ser: 0.67 mg/dL — ABNORMAL LOW (ref 0.76–1.27)
GFR calc non Af Amer: 102 mL/min/{1.73_m2} (ref >59)
GFR, EST AFRICAN AMERICAN: 118 mL/min/{1.73_m2} (ref >59)
GLOBULIN, TOTAL: 2.9 g/dL (ref 1.5–4.5)
Glucose: 113 mg/dL — ABNORMAL HIGH (ref 65–99)
POTASSIUM: 4.4 mmol/L (ref 3.5–5.2)
SODIUM: 139 mmol/L (ref 134–144)
TOTAL PROTEIN: 7.1 g/dL (ref 6.0–8.5)

## 2018-05-07 LAB — IRON AND TIBC
IRON SATURATION: 4 % — AB (ref 15–55)
Iron: 17 ug/dL — ABNORMAL LOW (ref 38–169)
TIBC: 389 ug/dL (ref 250–450)
UIBC: 372 ug/dL — AB (ref 111–343)

## 2018-05-07 LAB — FERRITIN: FERRITIN: 19 ng/mL — AB (ref 30–400)

## 2018-05-08 ENCOUNTER — Encounter: Payer: Self-pay | Admitting: Family Medicine

## 2018-05-08 DIAGNOSIS — D500 Iron deficiency anemia secondary to blood loss (chronic): Secondary | ICD-10-CM | POA: Insufficient documentation

## 2018-05-08 DIAGNOSIS — I70269 Atherosclerosis of native arteries of extremities with gangrene, unspecified extremity: Secondary | ICD-10-CM | POA: Insufficient documentation

## 2018-05-08 HISTORY — DX: Atherosclerosis of native arteries of extremities with gangrene, unspecified extremity: I70.269

## 2018-05-08 NOTE — Assessment & Plan Note (Addendum)
Stable at 11.0- but still low. Iron deficiency Anemia- colonoscopy pending 05/12/18. Cannot come off eliquis due to a fib and recurrent strokes. Needs to have amputation for necrotic toes- now with leg ulcer, possibly needs higher amputation. ?iron infusion so he can have his surgery vs. Clearance that he can have his surgery.

## 2018-05-08 NOTE — Assessment & Plan Note (Addendum)
Not doing well. Had acute GI bleed, so amputation was held off. On iron and hemoglobin as been holding stable, but still low. Has seen GI, but was not able to have colonoscopy because he was not cleared out. Now to be having repeat colonoscopy next week. Foot appears to be getting worse, and now with ulcer on R leg. Note from podiatry not available at this time, but he is to follow up with vascular ASAP, unclear if podiatry is willing to do his surgery at this time. Significant concern for worsening of his condition and need for further amputation if we are not able to get this done sooner rather than later. We will see about getting him into hematology for ? Iron infusion/clearance to try to get him his surgery ASAP. We will increase his pain medicine to every 6 hours until he can get his surgery. Rx given today. Monitor very closely.

## 2018-05-08 NOTE — Assessment & Plan Note (Signed)
A1c elevated at 7.2. Will continue current regimen right now. Needs to have surgery, then we will deal with this after surgery.

## 2018-05-08 NOTE — Assessment & Plan Note (Addendum)
To see wound on Monday for evaluation. Known necrotic toes. Had acute GI bleed, so amputation was held off. On iron and hemoglobin as been holding stable, but still low. Has seen GI, but was not able to have colonoscopy because he was not cleared out. Now to be having repeat colonoscopy next week. Foot appears to be getting worse, and now with ulcer on R leg. Note from podiatry not available at this time, but he is to follow up with vascular ASAP, unclear if podiatry is willing to do his surgery at this time.

## 2018-05-09 ENCOUNTER — Encounter: Payer: Medicare HMO | Attending: Physician Assistant | Admitting: Physician Assistant

## 2018-05-09 DIAGNOSIS — Z87891 Personal history of nicotine dependence: Secondary | ICD-10-CM | POA: Diagnosis not present

## 2018-05-09 DIAGNOSIS — Z8673 Personal history of transient ischemic attack (TIA), and cerebral infarction without residual deficits: Secondary | ICD-10-CM | POA: Insufficient documentation

## 2018-05-09 DIAGNOSIS — I4891 Unspecified atrial fibrillation: Secondary | ICD-10-CM | POA: Diagnosis not present

## 2018-05-09 DIAGNOSIS — E1151 Type 2 diabetes mellitus with diabetic peripheral angiopathy without gangrene: Secondary | ICD-10-CM | POA: Diagnosis not present

## 2018-05-09 DIAGNOSIS — F0390 Unspecified dementia without behavioral disturbance: Secondary | ICD-10-CM | POA: Diagnosis not present

## 2018-05-09 DIAGNOSIS — I10 Essential (primary) hypertension: Secondary | ICD-10-CM | POA: Diagnosis not present

## 2018-05-09 DIAGNOSIS — E11621 Type 2 diabetes mellitus with foot ulcer: Secondary | ICD-10-CM | POA: Insufficient documentation

## 2018-05-09 DIAGNOSIS — R69 Illness, unspecified: Secondary | ICD-10-CM | POA: Diagnosis not present

## 2018-05-09 DIAGNOSIS — L97519 Non-pressure chronic ulcer of other part of right foot with unspecified severity: Secondary | ICD-10-CM | POA: Diagnosis not present

## 2018-05-09 DIAGNOSIS — I96 Gangrene, not elsewhere classified: Secondary | ICD-10-CM | POA: Diagnosis not present

## 2018-05-10 ENCOUNTER — Ambulatory Visit: Payer: Medicare HMO | Admitting: Family Medicine

## 2018-05-10 ENCOUNTER — Telehealth: Payer: Self-pay

## 2018-05-10 DIAGNOSIS — I739 Peripheral vascular disease, unspecified: Secondary | ICD-10-CM

## 2018-05-10 DIAGNOSIS — I70232 Atherosclerosis of native arteries of right leg with ulceration of calf: Secondary | ICD-10-CM

## 2018-05-10 NOTE — Telephone Encounter (Signed)
Urgent referral generated today. Already seeing [facility]- wants 2nd opinion- ASAP please

## 2018-05-10 NOTE — Telephone Encounter (Signed)
Called and spoke to patient and wife about OV today. Patient is wanting to have a second opinion on his leg. Wife also states pt is not being compliant and is not wanting to take medication for colonoscopy. Spoke with patient about importance of following provider's instructions and recommendations. Patient stated his most important agenda today is to get second opinion on leg. Please advise.    OV for today was cancelled.

## 2018-05-11 ENCOUNTER — Other Ambulatory Visit: Payer: Self-pay

## 2018-05-11 ENCOUNTER — Encounter: Payer: Self-pay | Admitting: Oncology

## 2018-05-11 ENCOUNTER — Inpatient Hospital Stay: Payer: Medicare HMO | Attending: Oncology | Admitting: Oncology

## 2018-05-11 VITALS — BP 118/73 | HR 83 | Temp 96.8°F | Ht 68.5 in | Wt 180.5 lb

## 2018-05-11 DIAGNOSIS — Z7982 Long term (current) use of aspirin: Secondary | ICD-10-CM | POA: Diagnosis not present

## 2018-05-11 DIAGNOSIS — I10 Essential (primary) hypertension: Secondary | ICD-10-CM | POA: Insufficient documentation

## 2018-05-11 DIAGNOSIS — R5383 Other fatigue: Secondary | ICD-10-CM

## 2018-05-11 DIAGNOSIS — Z7901 Long term (current) use of anticoagulants: Secondary | ICD-10-CM | POA: Insufficient documentation

## 2018-05-11 DIAGNOSIS — R0602 Shortness of breath: Secondary | ICD-10-CM | POA: Insufficient documentation

## 2018-05-11 DIAGNOSIS — I70232 Atherosclerosis of native arteries of right leg with ulceration of calf: Secondary | ICD-10-CM | POA: Insufficient documentation

## 2018-05-11 DIAGNOSIS — E785 Hyperlipidemia, unspecified: Secondary | ICD-10-CM | POA: Insufficient documentation

## 2018-05-11 DIAGNOSIS — Z87891 Personal history of nicotine dependence: Secondary | ICD-10-CM | POA: Diagnosis not present

## 2018-05-11 DIAGNOSIS — I70268 Atherosclerosis of native arteries of extremities with gangrene, other extremity: Secondary | ICD-10-CM | POA: Diagnosis not present

## 2018-05-11 DIAGNOSIS — Z79899 Other long term (current) drug therapy: Secondary | ICD-10-CM | POA: Insufficient documentation

## 2018-05-11 DIAGNOSIS — D509 Iron deficiency anemia, unspecified: Secondary | ICD-10-CM | POA: Diagnosis not present

## 2018-05-11 DIAGNOSIS — E1151 Type 2 diabetes mellitus with diabetic peripheral angiopathy without gangrene: Secondary | ICD-10-CM | POA: Diagnosis not present

## 2018-05-11 DIAGNOSIS — I96 Gangrene, not elsewhere classified: Secondary | ICD-10-CM

## 2018-05-11 DIAGNOSIS — R531 Weakness: Secondary | ICD-10-CM | POA: Diagnosis not present

## 2018-05-11 DIAGNOSIS — I480 Paroxysmal atrial fibrillation: Secondary | ICD-10-CM

## 2018-05-11 DIAGNOSIS — Z8673 Personal history of transient ischemic attack (TIA), and cerebral infarction without residual deficits: Secondary | ICD-10-CM | POA: Diagnosis not present

## 2018-05-11 NOTE — Progress Notes (Signed)
Hematology/Oncology Consult note [facility] Telephone:([phone] [phone]   Patient Care Team: [name], DO as PCP - General (Family Medicine) [name], DO as Referring Physician (Family Medicine) Yolonda Kida, MD as Consulting Physician (Cardiology) Vladimir Crofts, MD (Neurology)  REFERRING PROVIDER: [name], DO CHIEF COMPLAINTS/REASON FOR VISIT:  Evaluation of anemia  HISTORY OF PRESENTING ILLNESS:  Glenn Gee. is a  62 y.o.  male with PMH listed below who was referred to me for evaluation of anemia Reviewed patient's recent labs that was done at [facility] office. Labs revealed anemia with hemoglobin of 11 .   Reviewed patient's previous labs ordered by primary care physician's office, anemia onset was since July 2019 04/07/2018, iron panel showed ferritin 13, iron saturation 28, TIBC 500. He takes oral iron supplementation. 05/06/2018, iron panel showed ferritin 19, iron saturation 4.  TIBC 389. Patient was scheduled for colonoscopy on 05/12/2018 for evaluation of GI blood loss. Patient is on chronic anticoagulation Eliquis for atrial fibrillation and recurrent stroke.  Also on Plavix.  For colonoscopy, he was advised to hold oral iron supplements 5 days prior to the colonoscopy.  Patient has severe peripheral vascular disease with complications including lower extremity ulceration and necrotic toes.  Amputation was held due to acute GI bleed.  Patient reports severe pain for which he takes Neurontin 400 mg 3 times a day and the Percocet 10-325 mg 1 tablet every 6 hours as needed for pain.  Patient reports pain is at a level 5 out of 10.  Appears uncomfortable, alternating between sitting and standing during assessment due to lower extremity discomfort.  He reports that pain medication has relief his pain sometimes.  Associated signs and symptoms: Patient reports fatigue.  Shortness of breath with exertion.  Review  of Systems  Constitutional: Positive for malaise/fatigue. Negative for chills, fever and weight loss.  HENT: Negative for nosebleeds and sore throat.   Eyes: Negative for double vision, photophobia and redness.  Respiratory: Positive for shortness of breath. Negative for cough and wheezing.   Cardiovascular: Negative for chest pain, palpitations and orthopnea.  Gastrointestinal: Negative for abdominal pain, blood in stool, nausea and vomiting.  Genitourinary: Negative for dysuria.  Musculoskeletal: Negative for myalgias.       Necrotic toe, lower extremity ulcer.  Lower extremity pain.  Skin: Negative for itching and rash.  Neurological: Negative for dizziness, tingling and tremors.  Endo/Heme/Allergies: Negative for environmental allergies. Does not bruise/bleed easily.  Psychiatric/Behavioral: Negative for depression.    MEDICAL HISTORY:  Past Medical History:  Diagnosis Date  . Anemia   . Diabetes mellitus with complication ([place])   . History of hernia repair   . Hyperlipidemia   . Hypertension   . Legally blind   . PAF (paroxysmal atrial fibrillation) ([name])    a. on Eliquis as of 2018; b. CHADS2VASc => 5 (HTN, DM, stroke x 2, vascular disease)  . Peripheral vascular disease ([place])    a. followed by Dr. Lucky Cowboy; b. s/p kissing balloon stents and right external iliac stent in 10/2017; c. LE angiogrpahy 01/2018: No significant arterial occlusive disease in the lower extremities.  . Stroke [facility])    a. 2016 & 2018    SURGICAL HISTORY: Past Surgical History:  Procedure Laterality Date  . APPENDECTOMY    . HERNIA REPAIR     UMBILICAL  . LOWER EXTREMITY ANGIOGRAPHY Right 10/25/2017   Procedure: LOWER EXTREMITY ANGIOGRAPHY;  Surgeon: [name], MD;  Location: [place] [facility];  Service: Cardiovascular;  Laterality: Right;  . LOWER EXTREMITY ANGIOGRAPHY Right 01/13/2018   Procedure: LOWER EXTREMITY ANGIOGRAPHY;  Surgeon: [name], MD;  Location: [place] [facility];  Service:  Cardiovascular;  Laterality: Right;  . LOWER EXTREMITY INTERVENTION  10/25/2017   Procedure: LOWER EXTREMITY INTERVENTION;  Surgeon: [name], MD;  Location: [place] [facility];  Service: Cardiovascular;;  . TONSILLECTOMY      SOCIAL HISTORY: Social History   Socioeconomic History  . Marital status: Married    Spouse name: Not on file  . Number of children: 0  . Years of education: Not on file  . Highest education level: Not on file  Occupational History  . Not on file  Social Needs  . Financial resource strain: Not on file  . Food insecurity:    Worry: Not on file    Inability: Not on file  . Transportation needs:    Medical: Not on file    Non-medical: Not on file  Tobacco Use  . Smoking status: Former Smoker    Packs/day: 1.00    Years: 30.00    Pack years: 30.00    Types: Cigarettes    Last attempt to quit: 01/09/2018    Years since quitting: 0.3  . Smokeless tobacco: Never Used  Substance and Sexual Activity  . Alcohol use: Yes    Alcohol/week: 8.0 standard drinks    Types: 8 Cans of beer per week    Comment: occasionally beer   . Drug use: No  . Sexual activity: Yes  Lifestyle  . Physical activity:    Days per week: Not on file    Minutes per session: Not on file  . Stress: Not on file  Relationships  . Social connections:    Talks on phone: Not on file    Gets together: Not on file    Attends religious service: Not on file    Active member of club or organization: Not on file    Attends meetings of clubs or organizations: Not on file    Relationship status: Not on file  . Intimate partner violence:    Fear of current or ex partner: Not on file    Emotionally abused: Not on file    Physically abused: Not on file    Forced sexual activity: Not on file  Other Topics Concern  . Not on file  Social History Narrative  . Not on file    FAMILY HISTORY: Family History  Problem Relation Age of Onset  . Diabetes Father   . Hypertension Father   .  Hyperlipidemia Father     ALLERGIES:  is allergic to sodium pentobarbital [pentobarbital] and lipitor [atorvastatin].  MEDICATIONS:  Current Outpatient Medications  Medication Sig Dispense Refill  . acetaminophen (TYLENOL) 500 MG tablet Take 1,000 mg by mouth every 6 (six) hours as needed (for pain.).    Marland Kitchen apixaban (ELIQUIS) 5 MG TABS tablet Take 1 tablet (5 mg total) by mouth 2 (two) times daily. 180 tablet 1  . aspirin 81 MG EC tablet Take 1 tablet (81 mg total) by mouth daily. 90 tablet 4  . Biotin (BIOTIN 5000) 5 MG CAPS Take 1 capsule by mouth daily.    . empagliflozin (JARDIANCE) 10 MG TABS tablet Take 5 mg by mouth daily. 90 tablet 1  . ezetimibe-simvastatin (VYTORIN) 10-40 MG tablet Take 1 tablet by mouth daily at 6 PM. 90 tablet 3  . ferrous sulfate (  FERROUSUL) 325 (65 FE) MG tablet Take 1 tablet (325 mg total) by mouth daily with breakfast. 90 tablet 3  . gabapentin (NEURONTIN) 400 MG capsule Take 1 capsule (400 mg total) by mouth 3 (three) times daily. (Patient taking differently: Take 400 mg by mouth every 8 (eight) hours as needed (for pain). ) 90 capsule 1  . hydrochlorothiazide (MICROZIDE) 12.5 MG capsule Take 1 capsule (12.5 mg total) by mouth daily. 90 capsule 3  . metFORMIN (GLUCOPHAGE) 1000 MG tablet Take 1 tablet (1,000 mg total) by mouth 2 (two) times daily with a meal. 180 tablet 3  . metoprolol tartrate (LOPRESSOR) 25 MG tablet Take 1 tablet (25 mg total) by mouth 2 (two) times daily. 180 tablet 3  . Multiple Vitamin (MULTIVITAMIN WITH MINERALS) TABS tablet Take 1 tablet by mouth daily. 30 tablet 0  . oxyCODONE-acetaminophen (PERCOCET) 10-325 MG tablet Take 1 tablet by mouth every 6 (six) hours as needed for pain. 90 tablet 0  . sulfamethoxazole-trimethoprim (BACTRIM DS,SEPTRA DS) 800-160 MG tablet Take 1 tablet by mouth 2 (two) times daily. 14 tablet 0  . vitamin C (ASCORBIC ACID) 500 MG tablet Take 500 mg by mouth daily.    . cholecalciferol (VITAMIN D) 1000 units  tablet Take 1,000 Units by mouth daily.     No current facility-administered medications for this visit.      PHYSICAL EXAMINATION: ECOG PERFORMANCE STATUS: 1 - Symptomatic but completely ambulatory Vitals:   05/11/18 1101  BP: 118/73  Pulse: 83  Temp: (!) 96.8 F (36 C)   Filed Weights   05/11/18 1101  Weight: 180 lb 8 oz (81.9 kg)    Physical Exam  Constitutional: He is oriented to person, place, and time. He appears distressed.  HENT:  Head: Normocephalic and atraumatic.  Mouth/Throat: Oropharynx is clear and moist.  Eyes: Pupils are equal, round, and reactive to light. EOM are normal. No scleral icterus.  Neck: Normal range of motion. Neck supple.  Cardiovascular: Normal rate, regular rhythm and normal heart sounds.  Pulmonary/Chest: Effort normal. No respiratory distress. He has no wheezes.  Abdominal: Soft. He exhibits no distension and no mass. There is no tenderness.  Musculoskeletal: He exhibits no tenderness.  Ulcer on the anterior portion of R leg. Necrotic toes on R foot   Neurological: He is alert and oriented to person, place, and time. No cranial nerve deficit. Coordination normal.  Skin: Skin is warm and dry. No rash noted. No erythema.  Psychiatric:  Anxious     LABORATORY DATA:  I have reviewed the data as listed Lab Results  Component Value Date   WBC 10.4 05/06/2018   HGB 11.0 (L) 05/06/2018   HCT 34.2 (L) 05/06/2018   MCV 76 (L) 05/06/2018   PLT 309 05/06/2018   Recent Labs    03/31/18 1446 04/06/18 1437 05/06/18 1624  NA 137 141 139  K 4.4 4.3 4.4  CL 98 101 97  CO2 24 22 22   GLUCOSE 137* 125* 113*  BUN 16 11 11   CREATININE 0.68* 0.71* 0.67*  CALCIUM 9.4 9.5 9.7  GFRNONAA 102 100 102  GFRAA 117 115 118  PROT 7.2 7.2 7.1  ALBUMIN 4.2 4.3 4.2  AST 14 28 15   ALT 16 19 13   ALKPHOS 88 80 82  BILITOT 0.4 0.4 0.2   Iron/TIBC/Ferritin/ %Sat    Component Value Date/Time   IRON 17 (L) 05/06/2018 1624   TIBC 389 05/06/2018 1624    FERRITIN 19 (L) 05/06/2018 1624  IRONPCTSAT 4 (LL) 05/06/2018 1624        ASSESSMENT & PLAN:  1. Iron deficiency anemia, unspecified iron deficiency anemia type   2. Necrotic toes ([place])   3. Chronic anticoagulation   4. 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  He has severe iron deficiency, iron saturation decreased despite being on oral iron supplements. Recommend IV iron. Plan IV iron with Feraheme 510mg  weekly x 2. Allergy reactions/infusion reaction including anaphylactic reaction discussed with patient. Other side effects include but not limited to high blood pressure, skin rash, weight gain, leg swelling, etc. Patient voices understanding and willing to proceed. He has colonoscopy scheduled tomorrow. Patient appears very uncomfortable due to the lower extremity pain/discomfort.  Urged patient to follow-up with vascular surgeon closely and discuss about amputation.  He voices understanding.  Orders Placed This Encounter  Procedures  . CBC with Differential/Platelet    Standing Status:   Future    Standing Expiration Date:   05/11/2019  . Iron and TIBC    Standing Status:   Future    Standing Expiration Date:   05/12/2019  . Ferritin    Standing Status:   Future    Standing Expiration Date:   05/12/2019    All questions were answered. The patient knows to call the clinic with any problems questions or concerns.  Return of visit: 5 weeks Thank you for this kind referral and the opportunity to participate in the care of this patient. A copy of today's note is routed to referring provider  Total face to face encounter time for this patient visit was 54min. >50% of the time was  spent in counseling and coordination of care.    Earlie Server, MD, PhD Hematology Oncology [facility] Pager- [phone] 05/11/2018

## 2018-05-11 NOTE — Progress Notes (Signed)
Patient here for initial visit. He has had 2 strokes, one in 2006 and one in  Nov 2018. Strokes affected right side and  Memory. 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 Pt alternates between sitting and standing during assessment, due to pain. He takes percocet and states "it helps sometimes." He will have colonoscopy tomorrow.

## 2018-05-12 ENCOUNTER — Encounter
Admission: RE | Disposition: A | Discharge status: Home / Self Care | Payer: Self-pay | Source: Ambulatory Visit | Attending: Gastroenterology

## 2018-05-12 ENCOUNTER — Ambulatory Visit
Admission: RE | Admit: 2018-05-12 | Discharge: 2018-05-12 | Disposition: A | Discharge status: Home / Self Care | Payer: Medicare HMO | Source: Ambulatory Visit | Attending: Gastroenterology | Admitting: Gastroenterology

## 2018-05-12 ENCOUNTER — Ambulatory Visit: Payer: Medicare HMO | Admitting: Certified Registered"

## 2018-05-12 ENCOUNTER — Encounter: Payer: Self-pay | Admitting: Anesthesiology

## 2018-05-12 ENCOUNTER — Telehealth: Payer: Self-pay | Admitting: Cardiovascular Disease

## 2018-05-12 DIAGNOSIS — D124 Benign neoplasm of descending colon: Secondary | ICD-10-CM | POA: Insufficient documentation

## 2018-05-12 DIAGNOSIS — D125 Benign neoplasm of sigmoid colon: Secondary | ICD-10-CM

## 2018-05-12 DIAGNOSIS — I10 Essential (primary) hypertension: Secondary | ICD-10-CM | POA: Insufficient documentation

## 2018-05-12 DIAGNOSIS — E1151 Type 2 diabetes mellitus with diabetic peripheral angiopathy without gangrene: Secondary | ICD-10-CM | POA: Diagnosis not present

## 2018-05-12 DIAGNOSIS — D509 Iron deficiency anemia, unspecified: Secondary | ICD-10-CM | POA: Diagnosis not present

## 2018-05-12 DIAGNOSIS — D128 Benign neoplasm of rectum: Secondary | ICD-10-CM | POA: Diagnosis not present

## 2018-05-12 DIAGNOSIS — Z7982 Long term (current) use of aspirin: Secondary | ICD-10-CM | POA: Diagnosis not present

## 2018-05-12 DIAGNOSIS — Z5329 Procedure and treatment not carried out because of patient's decision for other reasons: Secondary | ICD-10-CM | POA: Insufficient documentation

## 2018-05-12 DIAGNOSIS — D122 Benign neoplasm of ascending colon: Secondary | ICD-10-CM | POA: Diagnosis not present

## 2018-05-12 DIAGNOSIS — M7989 Other specified soft tissue disorders: Secondary | ICD-10-CM | POA: Insufficient documentation

## 2018-05-12 DIAGNOSIS — Z79899 Other long term (current) drug therapy: Secondary | ICD-10-CM | POA: Insufficient documentation

## 2018-05-12 DIAGNOSIS — I480 Paroxysmal atrial fibrillation: Secondary | ICD-10-CM | POA: Diagnosis not present

## 2018-05-12 DIAGNOSIS — Z8673 Personal history of transient ischemic attack (TIA), and cerebral infarction without residual deficits: Secondary | ICD-10-CM | POA: Diagnosis not present

## 2018-05-12 DIAGNOSIS — K635 Polyp of colon: Secondary | ICD-10-CM | POA: Insufficient documentation

## 2018-05-12 DIAGNOSIS — H548 Legal blindness, as defined in USA: Secondary | ICD-10-CM | POA: Insufficient documentation

## 2018-05-12 DIAGNOSIS — K2950 Unspecified chronic gastritis without bleeding: Secondary | ICD-10-CM | POA: Insufficient documentation

## 2018-05-12 DIAGNOSIS — Z87891 Personal history of nicotine dependence: Secondary | ICD-10-CM | POA: Diagnosis not present

## 2018-05-12 DIAGNOSIS — D123 Benign neoplasm of transverse colon: Secondary | ICD-10-CM

## 2018-05-12 DIAGNOSIS — E785 Hyperlipidemia, unspecified: Secondary | ICD-10-CM | POA: Insufficient documentation

## 2018-05-12 DIAGNOSIS — I499 Cardiac arrhythmia, unspecified: Secondary | ICD-10-CM | POA: Diagnosis not present

## 2018-05-12 DIAGNOSIS — K2960 Other gastritis without bleeding: Secondary | ICD-10-CM | POA: Insufficient documentation

## 2018-05-12 DIAGNOSIS — Z95820 Peripheral vascular angioplasty status with implants and grafts: Secondary | ICD-10-CM | POA: Insufficient documentation

## 2018-05-12 DIAGNOSIS — K5730 Diverticulosis of large intestine without perforation or abscess without bleeding: Secondary | ICD-10-CM | POA: Diagnosis not present

## 2018-05-12 DIAGNOSIS — K2970 Gastritis, unspecified, without bleeding: Secondary | ICD-10-CM | POA: Diagnosis not present

## 2018-05-12 DIAGNOSIS — K621 Rectal polyp: Secondary | ICD-10-CM | POA: Diagnosis not present

## 2018-05-12 DIAGNOSIS — R000 Tachycardia, unspecified: Secondary | ICD-10-CM | POA: Diagnosis not present

## 2018-05-12 DIAGNOSIS — K5790 Diverticulosis of intestine, part unspecified, without perforation or abscess without bleeding: Secondary | ICD-10-CM | POA: Diagnosis not present

## 2018-05-12 HISTORY — PX: ESOPHAGOGASTRODUODENOSCOPY (EGD) WITH PROPOFOL: SHX5813

## 2018-05-12 HISTORY — PX: COLONOSCOPY WITH PROPOFOL: SHX5780

## 2018-05-12 LAB — GLUCOSE, CAPILLARY: GLUCOSE-CAPILLARY: 130 mg/dL — AB (ref 70–99)

## 2018-05-12 SURGERY — COLONOSCOPY WITH PROPOFOL
Anesthesia: General

## 2018-05-12 MED ORDER — METOPROLOL TARTRATE 25 MG PO TABS
25.0000 mg | ORAL_TABLET | Freq: Once | ORAL | Status: AC
  Administered 2018-05-12: 25 mg via ORAL

## 2018-05-12 MED ORDER — PROPOFOL 10 MG/ML IV BOLUS
INTRAVENOUS | Status: DC | PRN
  Administered 2018-05-12 (×2): 20 mg via INTRAVENOUS
  Administered 2018-05-12: 40 mg via INTRAVENOUS

## 2018-05-12 MED ORDER — PROPOFOL 500 MG/50ML IV EMUL
INTRAVENOUS | Status: DC | PRN
  Administered 2018-05-12: 100 ug/kg/min via INTRAVENOUS

## 2018-05-12 MED ORDER — LIDOCAINE HCL (CARDIAC) PF 100 MG/5ML IV SOSY
PREFILLED_SYRINGE | INTRAVENOUS | Status: DC | PRN
  Administered 2018-05-12: 100 mg via INTRAVENOUS

## 2018-05-12 MED ORDER — METOPROLOL TARTRATE 25 MG PO TABS
ORAL_TABLET | ORAL | Status: AC
  Filled 2018-05-12: qty 1

## 2018-05-12 MED ORDER — LIDOCAINE HCL (PF) 1 % IJ SOLN
INTRAMUSCULAR | Status: AC
  Administered 2018-05-12: 0.3 mL
  Filled 2018-05-12: qty 2

## 2018-05-12 MED ORDER — SODIUM CHLORIDE 0.9 % IV SOLN
INTRAVENOUS | Status: DC
  Administered 2018-05-12: 1000 mL via INTRAVENOUS

## 2018-05-12 NOTE — Op Note (Addendum)
[facility] Gastroenterology Patient Name: [name] Procedure Date: 05/12/2018 9:21 AM MRN: [id number] Account #: [id number] Date of Birth: 1955/11/09 Admit Type: Outpatient Age: 62 Room: [facility] ENDO ROOM 4 Gender: Male Note Status: Finalized Procedure:            Colonoscopy Indications:          Iron deficiency anemia Providers:            [name] MD, MD Referring MD:         [name] (Referring MD) Medicines:            Monitored Anesthesia Care Complications:        No immediate complications. Procedure:            Pre-Anesthesia Assessment:                       - Prior to the procedure, a History and Physical was                        performed, and patient medications, allergies and                        sensitivities were reviewed. The patient's tolerance of                        previous anesthesia was reviewed.                       - The risks and benefits of the procedure and the                        sedation options and risks were discussed with the                        patient. All questions were answered and informed                        consent was obtained.                       - ASA Grade Assessment: III - A patient with severe                        systemic disease.                       After obtaining informed consent, the colonoscope was                        passed under direct vision. Throughout the procedure,                        the patient's blood pressure, pulse, and oxygen                        saturations were monitored continuously. The                        Colonoscope was introduced through the anus and                        advanced  to the the cecum, identified by the                        appendiceal orifice, IC valve and transillumination.                        The colonoscopy was performed with ease. The patient                        tolerated the procedure well. The quality of the bowel                      preparation was good. Findings:      The perianal and digital rectal examinations were normal.      Multiple small-mouthed diverticula were found in the sigmoid colon.      Four sessile polyps were found in the rectum, sigmoid colon, descending       colon and ascending colon. The polyps were 4 to 7 mm in size. These       polyps were removed with a cold snare. Resection and retrieval were       complete.      A 3 mm polyp was found in the transverse colon. The polyp was sessile.       The polyp was removed with a cold biopsy forceps. Resection and       retrieval were complete.      The exam was otherwise without abnormality on direct and retroflexion       views. Impression:           - Diverticulosis in the sigmoid colon.                       - Four 4 to 7 mm polyps in the rectum, in the sigmoid                        colon, in the descending colon and in the ascending                        colon, removed with a cold snare. Resected and                        retrieved.                       - One 3 mm polyp in the transverse colon, removed with                        a cold biopsy forceps. Resected and retrieved.                       - The examination was otherwise normal on direct and                        retroflexion views. Recommendation:       - Discharge patient to home (with escort).                       - Resume previous diet.                       - Continue  present medications.                       - Await pathology results.                       - Repeat colonoscopy for surveillance based on                        pathology results.                       - To visualize the small bowel, perform video capsule                        endoscopy in 2 weeks.                       - He is tachycardic durint the procedure ? a fibb-                        anesthesia aware and will check EKG Procedure Code(s):    --- Professional ---                        [phone], Colonoscopy, flexible; with removal of tumor(s),                        polyp(s), or other lesion(s) by snare technique                       45380, 59, Colonoscopy, flexible; with biopsy, single                        or multiple Diagnosis Code(s):    --- Professional ---                       K62.1, Rectal polyp                       D12.5, Benign neoplasm of sigmoid colon                       D12.4, Benign neoplasm of descending colon                       D12.2, Benign neoplasm of ascending colon                       D12.3, Benign neoplasm of transverse colon (hepatic                        flexure or splenic flexure)                       D50.9, Iron deficiency anemia, unspecified                       K57.30, Diverticulosis of large intestine without                        perforation or abscess without bleeding CPT copyright 2018 American Medical Association. All rights reserved. The codes documented in this report are preliminary and upon coder review  may  be revised to meet current compliance requirements. [name], MD [name] MD, MD 05/12/2018 10:07:52 AM This report has been signed electronically. Number of Addenda: 0 Note Initiated On: 05/12/2018 9:21 AM Scope Withdrawal Time: 0 hours 21 minutes 16 seconds  Total Procedure Duration: 0 hours 26 minutes 51 seconds       [facility]

## 2018-05-12 NOTE — Telephone Encounter (Signed)
Nurse states he just had an EGD and colonoscopy and thinks pt is in afib.

## 2018-05-12 NOTE — Telephone Encounter (Signed)
Magda Paganini from Endoscopy calling to make office aware  States that patient has refused to be admitted and has gone home

## 2018-05-12 NOTE — Anesthesia Post-op Follow-up Note (Signed)
Anesthesia QCDR form completed.        

## 2018-05-12 NOTE — Progress Notes (Signed)
Patient refuses to be admitted to the hospital due to Atrial Fibrillation with rate of 130s to 140s.  Cardiology recommended admission but patient stated "I am not staying, I just want to go home."  Wife is at the bedside and is attempting to talk patient into staying for admission, but patient continues to refuse despite Dr. Vicente Males and Dr Rosey Bath speaking with him at length.  Patient did sign an AMA form and will be discharged per his wishes.  Vital signs recorded as well as discharge teaching following Upper ENdoscopy and Colonoscopy.

## 2018-05-12 NOTE — Telephone Encounter (Signed)
We should follow up within a week if possible.

## 2018-05-12 NOTE — Telephone Encounter (Signed)
Dr End spoke with the anesthesiologist concerning the patient.  Patient currently being admitted and cardiology service has been consulted.

## 2018-05-12 NOTE — Progress Notes (Addendum)
Noted he is in A fibb with RVR, swollen right leg with pain ?areas of gangrene   I explained he needs to get admitted, cardiac eval, R/o DVT, vascular surgery evaluation. Explained if he went home he can die.   He refuses to be admitted and will sign out AMA  Resume all anticoaguilation today   Dr [name] MD,MRCP [facility]) Gastroenterology/Hepatology Pager: [phone]

## 2018-05-12 NOTE — Anesthesia Preprocedure Evaluation (Signed)
Anesthesia Evaluation  Patient identified by MRN, date of birth, ID band Patient awake    Reviewed: Allergy & Precautions, H&P , NPO status , reviewed documented beta blocker date and time   Airway Mallampati: II  TM Distance: >3 FB Neck ROM: full    Dental  (+) Poor Dentition, Chipped, Missing, Edentulous Upper   Pulmonary former smoker,     + decreased breath sounds      Cardiovascular hypertension, + Peripheral Vascular Disease  Normal cardiovascular exam  Notes recorded by Rise Mu, PA-C on 04/08/2018 at 7:04 AM EDT Echo showed normal EF (pump function) with normal wall motion and normal relaxation of the heart. There was mild mitral regurgitation. Pressure inside the lungs was normal. 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  He will be moderate to high risk for upcoming noncardiac surgery. If needed, please draft a letter indicating this for me to sign and fax to performing physician.  We will need to schedule him for a CTA aorta at his convenience to further evaluate his dilated aortic root and ascending aorta.   Neuro/Psych  Headaches,  Neuromuscular disease CVA, Residual Symptoms    GI/Hepatic   Endo/Other  diabetes  Renal/GU Renal disease     Musculoskeletal   Abdominal   Peds  Hematology  (+) Blood dyscrasia, anemia ,   Anesthesia Other Findings Past Medical History: No date: Anemia No date: Diabetes mellitus with complication ([name]) No date: History of hernia repair No date: Hyperlipidemia No date: Hypertension No date: Legally blind No date: PAF (paroxysmal atrial fibrillation) ([place])     Comment:  a. on Eliquis as of 2018; b. CHADS2VASc => 5 (HTN, DM,               stroke x 2, vascular disease) No date: Peripheral vascular disease ([place])     Comment:  a. followed by Dr. Lucky Cowboy; b. s/p kissing balloon stents                and right external iliac stent in 10/2017; c. LE               angiogrpahy 01/2018: No significant arterial occlusive               disease in the lower extremities. No date: Stroke [facility])     Comment:  a. 2016 & 2018  Past Surgical History: No date: APPENDECTOMY No date: HERNIA REPAIR     Comment:  UMBILICAL 9/70/2637: LOWER EXTREMITY ANGIOGRAPHY; Right     Comment:  Procedure: LOWER EXTREMITY ANGIOGRAPHY;  Surgeon: [name], MD;  Location: [place] [facility];  Service:               Cardiovascular;  Laterality: Right; 01/13/2018: LOWER EXTREMITY ANGIOGRAPHY; Right     Comment:  Procedure: LOWER EXTREMITY ANGIOGRAPHY;  Surgeon: [name], MD;  Location: [place] [facility];  Service:               Cardiovascular;  Laterality: Right; 10/25/2017: LOWER EXTREMITY INTERVENTION     Comment:  Procedure: LOWER EXTREMITY INTERVENTION;  Surgeon: [name], MD;  Location: [place] INVASIVE CV  LAB;  Service:               Cardiovascular;; No date: TONSILLECTOMY  Gangrenous R foot - awaiting further surgical eval     Reproductive/Obstetrics                             Anesthesia Physical  Anesthesia Plan  ASA: IV  Anesthesia Plan: General   Post-op Pain Management:    Induction: Intravenous  PONV Risk Score and Plan: 2 and Treatment may vary due to age or medical condition and TIVA  Airway Management Planned: Nasal Cannula and Natural Airway  Additional Equipment:   Intra-op Plan:   Post-operative Plan:   Informed Consent: I have reviewed the patients History and Physical, chart, labs and discussed the procedure including the risks, benefits and alternatives for the proposed anesthesia with the patient or authorized representative who has indicated his/her understanding and acceptance.   Dental Advisory Given  Plan Discussed with:   Anesthesia Plan Comments:         Anesthesia Quick  Evaluation

## 2018-05-12 NOTE — Transfer of Care (Addendum)
Immediate Anesthesia Transfer of Care Note  Patient: [name].  Procedure(s) Performed: COLONOSCOPY WITH PROPOFOL (N/A ) ESOPHAGOGASTRODUODENOSCOPY (EGD) WITH PROPOFOL (N/A )  Patient Location: PACU  Anesthesia Type:General  Level of Consciousness: awake, alert  and responds to stimulation  Airway & Oxygen Therapy: Patient Spontanous Breathing and Patient connected to nasal cannula oxygen  Post-op Assessment: Report given to RN and Post -op Vital signs reviewed and unstable, Anesthesiologist notified. MDA is aware of elevated HR. EKG ordered. Awaiting for results.   Post vital signs: Reviewed and stable  Last Vitals:  Vitals Value Taken Time  BP 98/77 05/12/2018 10:14 AM  Temp 36.2 C 05/12/2018 10:09 AM  Pulse 130 05/12/2018 10:14 AM  Resp 18 05/12/2018 10:14 AM  SpO2 99 % 05/12/2018 10:14 AM    Last Pain:  Vitals:   05/12/18 1009  TempSrc: Tympanic  PainSc: 0-No pain         Complications: No apparent anesthesia complications

## 2018-05-12 NOTE — Op Note (Addendum)
[facility] Patient Name: [name] Procedure Date: 05/12/2018 9:23 AM MRN: [id number] Account #: [id number] Date of Birth: 1955/08/21 Admit Type: Outpatient Age: 62 Room: [facility] ENDO ROOM 4 Gender: Male Note Status: Finalized Procedure:            Upper GI endoscopy Indications:          Iron deficiency anemia Providers:            [name] MD, MD Referring MD:         [name] (Referring MD) Medicines:            Monitored Anesthesia Care Complications:        No immediate complications. Procedure:            Pre-Anesthesia Assessment:                       - Prior to the procedure, a History and Physical was                        performed, and patient medications, allergies and                        sensitivities were reviewed. The patient's tolerance of                        previous anesthesia was reviewed.                       - The risks and benefits of the procedure and the                        sedation options and risks were discussed with the                        patient. All questions were answered and informed                        consent was obtained.                       - ASA Grade Assessment: III - A patient with severe                        systemic disease.                       After obtaining informed consent, the endoscope was                        passed under direct vision. Throughout the procedure,                        the patient's blood pressure, pulse, and oxygen                        saturations were monitored continuously. The Endoscope                        was introduced through the mouth, and advanced to the  third part of duodenum. The upper GI endoscopy was                        accomplished with ease. The patient tolerated the                        procedure well. Findings:      The examined duodenum was normal.      The esophagus was normal.      Patchy  moderate inflammation characterized by adherent blood, congestion       (edema), erosions and erythema was found in the entire examined stomach.       Biopsies were taken with a cold forceps for histology.      The cardia and gastric fundus were normal on retroflexion. Impression:           - Normal examined duodenum.                       - Normal esophagus.                       - Gastritis. Biopsied. Recommendation:       - Await pathology results.                       - Perform a colonoscopy today. Procedure Code(s):    --- Professional ---                       [phone], Esophagogastroduodenoscopy, flexible, transoral;                        with biopsy, single or multiple Diagnosis Code(s):    --- Professional ---                       K29.70, Gastritis, unspecified, without bleeding                       D50.9, Iron deficiency anemia, unspecified CPT copyright 2018 [employer]. All rights reserved. The codes documented in this report are preliminary and upon coder review may  be revised to meet current compliance requirements. [name], MD [name] MD, MD 05/12/2018 9:36:52 AM This report has been signed electronically. Number of Addenda: 0 Note Initiated On: 05/12/2018 9:23 AM      [facility]

## 2018-05-12 NOTE — Progress Notes (Signed)
KRISHAN, MCBREEN ([id number]) Visit Report for 05/09/2018 Chief Complaint Document Details Patient Name: [name], [name]. Date of Service: 05/09/2018 2:15 PM Medical Record Number: [id number] Patient Account Number: [id number] Date of Birth/Sex: May 11, 1956 (62 y.o. M) Treating RN: Roger Shelter Primary Care Provider: Park Liter Other Clinician: Referring Provider: Park Liter Treating Provider/Extender: Melburn Hake, Julie Paolini Weeks in Treatment: 9 Information Obtained from: Patient Chief Complaint right toe Electronic Signature(s) Signed: 05/09/2018 5:00:41 PM By: Worthy Keeler PA-C Entered By: Worthy Keeler on 05/09/2018 14:21:18 Risdon, [name] ([id number]) -------------------------------------------------------------------------------- HPI Details Patient Name: Glenn Standard B. Date of Service: 05/09/2018 2:15 PM Medical Record Number: [id number] Patient Account Number: [id number] Date of Birth/Sex: 26-Jul-1956 (62 y.o. M) Treating RN: Roger Shelter Primary Care Provider: Park Liter Other Clinician: Referring Provider: Park Liter Treating Provider/Extender: Melburn Hake, Tieler Cournoyer Weeks in Treatment: 9 History of Present Illness HPI Description: 03/03/18- He is here for initial evaluation of right toe wounds and tissue necrosis. He saw Dr. Vickki Muff on 7/23 where he was noted to have progressive ischemic changes to his toes. He was referred from Dr. Fellows office to the wound clinic for possible hyperbaric oxygen therapy. At this time, based on the information we have at hand, there is no diagnosis that would qualify him for hyperbaric oxygen therapy. He underwent an angiogram on 6/13 by Dr. 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 He did not have intervention on 6/13. arterial studies performed on 6/19 showed resting ABI to the 0.99 (right) and 1.09 (left). 03/10/18-He is seen in follow up evaluation. Plain film x-ray ordered last week revealed no evidence of acute osseous abnormality and dorsal soft tissue swelling of the midfoot. Culture that was obtained last week from the right great toe was negative. He has an appointment with Dr Lucky Cowboy on 8/23. 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 The right great toe with a small, superficial, open area with resolution of erythema and improvement in edema; will continue with medihoney. He will follow up next week 03/17/18-He is seen in follow-up evaluation. He was able to reschedule his appointment with vascular medicine and was seen on 8/14. In office ABIs are similar to studies on 6/19, right toe waveform is blunted with known small vessel disease; they are recommending compression therapy which the patient is refusing to wear. 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 Vickki Muff. The patient's wife is going to try and move that appointment up to next week. The patient does exhibit right foot pain consistent with rest pain, relieved with dependent position. There is cyanotic discoloration to right toes 1-3 and along the lateral aspect and heel of the right foot. The wound to the dorsal aspect of the right great toe is larger, dry; we will initiate santyl. He can follow up in 1-2 weeks 04/15/18 on evaluation today patient's right foot ulcer in regard to the fourth and fifth toes really appears to be no better. In fact this is pretty much a complete region of gangrene that this point. He also has microvascular disease and the remaining toes and the  discoloration noted today  is extremely cyanotic blue/purple. He actually was supposed to have Artie had invitation but unfortunately this is been delayed because there is some question as to whether or not he may actually have a G.I. bleed due to being severely anemic. He's also apparently having some issues with his heart which is also delayed the process at this point. Unfortunately in general nothing seems to be improving if anything his wife tells me that this seems to be getting worse. 05/09/18 on evaluation today patient actually appears to be doing worse in regard to the gangrene of his right foot. This is actually spread to all toes and actually up a portion of his foot as well. He has been referred by podiatry to Dr. dew, vascular surgery, in order to evaluate for a below the knee amputation. Nonetheless the patient is having some discomfort but his biggest concern that he seems to be stuck on more than anything is how far up Dr. dew is going to have to take his leg off. This is a great concern for him. In fact he asked me the same question at least 4-5 times during evaluation today. Electronic Signature(s) Signed: 05/09/2018 5:00:41 PM By: Worthy Keeler PA-C Entered By: Worthy Keeler on 05/09/2018 16:43:51 [name], [name] ([id number]) -------------------------------------------------------------------------------- Physical Exam Details Patient Name: Glenn Standard B. Date of Service: 05/09/2018 2:15 PM Medical Record Number: [id number] Patient Account Number: [id number] Date of Birth/Sex: May 01, 1956 (62 y.o. M) Treating RN: Roger Shelter Primary Care Provider: Park Liter Other Clinician: Referring Provider: Park Liter Treating Provider/Extender: Melburn Hake, Jeremy Ditullio Weeks in Treatment: 9 Constitutional Well-nourished and well-hydrated in no acute distress. Respiratory normal breathing without difficulty. clear to auscultation bilaterally. Cardiovascular regular rate and  rhythm with normal S1, S2. Psychiatric this patient is able to make decisions and demonstrates good insight into disease process. Alert and Oriented x 3. pleasant and cooperative. Notes Patient's wound bed at this time shows evidence of spreading gangrene and discoloration of his toes of the right foot. He has this creeping up into the dorsal surface of his foot as well. With that being said it does sound like the plan at this time is evaluation for the possibility of a below the knee amputation depending on Dr. Bunnie Domino evaluation. Electronic Signature(s) Signed: 05/09/2018 5:00:41 PM By: Worthy Keeler PA-C Entered By: Worthy Keeler on 05/09/2018 16:44:45 [name], [name] ([id number]) -------------------------------------------------------------------------------- Physician Orders Details Patient Name: [name] Date of Service: 05/09/2018 2:15 PM Medical Record Number: [id number] Patient Account Number: [id number] Date of Birth/Sex: [birth year] (62 y.o. M) Treating RN: Roger Shelter Primary Care Provider: Park Liter Other Clinician: Referring Provider: Park Liter Treating Provider/Extender: Melburn Hake, Vonetta Foulk Weeks in Treatment: 9 Verbal / Phone Orders: No Diagnosis Coding ICD-10 Coding Code Description E11.621 Type 2 diabetes mellitus with foot ulcer S91.101S Unspecified open wound of right great toe without damage to nail, sequela R23.0 Cyanosis I73.9 Peripheral vascular disease, unspecified Wound Cleansing o Clean wound with Normal Saline. Primary Wound Dressing o Other: - betadine to all toes on right foot and wound areas on dorsal foot, and mid lower leg area Dressing Change Frequency o Change dressing every day. Follow-up Appointments o Other: - patient will call for follow up appointment as needed Electronic Signature(s) Signed: 05/09/2018 4:35:19 PM By: Roger Shelter Signed: 05/09/2018 5:00:41 PM By: Worthy Keeler PA-C Entered By: Roger Shelter on 05/09/2018 15:24:58 [name], [name] ([id number]) -------------------------------------------------------------------------------- Problem List Details Patient Name: Glenn Standard B. Date of Service: 05/09/2018  2:15 PM Medical Record Number: [id number] Patient Account Number: [id number] Date of Birth/Sex: 08-02-1956 (62 y.o. M) Treating RN: Roger Shelter Primary Care Provider: Park Liter Other Clinician: Referring Provider: Park Liter Treating Provider/Extender: Melburn Hake, Abeeha Twist Weeks in Treatment: 9 Active Problems ICD-10 Evaluated Encounter Code Description Active Date Today Diagnosis E11.621 Type 2 diabetes mellitus with foot ulcer 03/03/2018 No Yes S91.101S Unspecified open wound of right great toe without damage to 03/03/2018 No Yes nail, sequela R23.0 Cyanosis 03/03/2018 No Yes I73.9 Peripheral vascular disease, unspecified 03/03/2018 No Yes Inactive Problems Resolved Problems Electronic Signature(s) Signed: 05/09/2018 5:00:41 PM By: Worthy Keeler PA-C Entered By: Worthy Keeler on 05/09/2018 14:21:12 [name], [name] ([id number]) -------------------------------------------------------------------------------- Progress Note Details Patient Name: Glenn Standard B. Date of Service: 05/09/2018 2:15 PM Medical Record Number: [id number] Patient Account Number: [id number] Date of Birth/Sex: March 31, 1956 (62 y.o. M) Treating RN: Roger Shelter Primary Care Provider: Park Liter Other Clinician: Referring Provider: Park Liter Treating Provider/Extender: Melburn Hake, Ketara Cavness Weeks in Treatment: 9 Subjective Chief Complaint Information obtained from Patient right toe History of Present Illness (HPI) 03/03/18- He is here for initial evaluation of right toe wounds and tissue necrosis. He saw Dr. Vickki Muff on 7/23 where he was noted to have progressive ischemic changes to his toes. He was referred from Dr. Fellows office to the wound clinic for possible hyperbaric oxygen  therapy. At this time, based on the information we have at hand, there is no diagnosis that would qualify him for hyperbaric oxygen therapy. He underwent an angiogram on 6/13 by Dr. 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 He did not have intervention on 6/13. arterial studies performed on 6/19 showed resting ABI to the 0.99 (right) and 1.09 (left). 03/10/18-He is seen in follow up evaluation. Plain film x-ray ordered last week revealed no evidence of acute osseous abnormality and dorsal soft tissue swelling of the midfoot. Culture that was obtained last week from the right great toe was negative. He has an appointment with Dr Lucky Cowboy on 8/23. 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 The right great toe with a small, superficial, open area with resolution of erythema and improvement in edema; will continue with medihoney. He will follow up next week 03/17/18-He is seen in follow-up evaluation. He was able to reschedule his appointment with vascular medicine and was seen on 8/14. In office ABIs are similar to studies on 6/19, right toe waveform is blunted with known small vessel disease; they are recommending compression therapy which the patient is refusing to wear. 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 Vickki Muff. The patient's wife is going to try and move that appointment up to next week. The patient does exhibit right foot pain consistent with rest pain, relieved with dependent position. There is cyanotic discoloration to  right toes 1-3 and along the lateral aspect and heel of the right foot. The wound to the dorsal aspect of the right great toe is larger, dry; we will initiate santyl. He can follow up in 1-2 weeks 04/15/18 on evaluation today patient's right foot ulcer in regard to the fourth and fifth  toes really appears to be no better. In fact this is pretty much a complete region of gangrene that this point. He also has microvascular disease and the remaining toes and the discoloration noted today is extremely cyanotic blue/purple. He actually was supposed to have Artie had invitation but unfortunately this is been delayed because there is some question as to whether or not he may actually have a G.I. bleed due to being severely anemic. He's also apparently having some issues with his heart which is also delayed the process at this point. Unfortunately in general nothing seems to be improving if anything his wife tells me that this seems to be getting worse. 05/09/18 on evaluation today patient actually appears to be doing worse in regard to the gangrene of his right foot. This is actually spread to all toes and actually up a portion of his foot as well. He has been referred by podiatry to Dr. dew, vascular surgery, in order to evaluate for a below the knee amputation. Nonetheless the patient is having some discomfort but his biggest concern that he seems to be stuck on more than anything is how far up Dr. dew is going to have to take his leg off. This is a great concern for him. In fact he asked me the same question at least 4-5 times during evaluation today. Patient History Information obtained from Patient. Family History ASUNCION, SHIBATA ([id number]) No family history of Cancer, Diabetes, Heart Disease, Hypertension, Kidney Disease, Lung Disease, Seizures, Stroke, Thyroid Problems, Tuberculosis. Social History Former smoker - ended on 01/31/2018, Marital Status - Married, Alcohol Use - Moderate, Drug Use -  No History, Caffeine Use - Daily. Medical History Hospitalization/Surgery History - 06/03/2017, [facility], Stroke. Review of Systems (ROS) Constitutional Symptoms (General Health) Denies complaints or symptoms of Fever, Chills. Respiratory The patient has no complaints or symptoms. Cardiovascular The patient has no complaints or symptoms. Psychiatric The patient has no complaints or symptoms. Objective Constitutional Well-nourished and well-hydrated in no acute distress. Vitals Time Taken: 2:35 PM, Height: 70 in, Weight: 185 lbs, BMI: 26.5, Temperature: 98.3 F, Pulse: 78 bpm, Respiratory Rate: 16 breaths/min, Blood Pressure: 124/73 mmHg. Respiratory normal breathing without difficulty. clear to auscultation bilaterally. Cardiovascular regular rate and rhythm with normal S1, S2. Psychiatric this patient is able to make decisions and demonstrates good insight into disease process. Alert and Oriented x 3. pleasant and cooperative. General Notes: Patient's wound bed at this time shows evidence of spreading gangrene and discoloration of his toes of the right foot. He has this creeping up into the dorsal surface of his foot as well. With that being said it does sound like the plan at this time is evaluation for the possibility of a below the knee amputation depending on Dr. Bunnie Domino evaluation. Integumentary (Hair, Skin) Wound #1 status is Amputation - Anticipated. Original cause of wound was Gradually Appeared. The wound is located on the Right,Distal,Circumferential Foot. The wound measures 9cm length x 24cm width x 0.1cm depth; 169.646cm^2 area and 16.965cm^3 volume. There is a none present amount of drainage noted. The wound margin is indistinct and nonvisible. There is no granulation within the wound bed. There is a large (67-100%) amount of necrotic tissue within the wound bed including Eschar. The periwound skin appearance exhibited: Ecchymosis, Mottled. Periwound temperature was noted as  No Toro, Auburn B. ([id number]) Abnormality. The periwound has tenderness on palpation. Assessment Active Problems ICD-10 Type 2 diabetes mellitus with foot ulcer Unspecified open wound of right great toe without  damage to nail, sequela Cyanosis Peripheral vascular disease, unspecified Plan Wound Cleansing: Clean wound with Normal Saline. Primary Wound Dressing: Other: - betadine to all toes on right foot and wound areas on dorsal foot, and mid lower leg area Dressing Change Frequency: Change dressing every day. Follow-up Appointments: Other: - patient will call for follow up appointment as needed At this time my suggestion is that we continue with the Betadine at this point I do not believe there's anything different that we really should nor could do in order to change the situation. The patient is in agreement with that plan. With that being said I do not believe he needs any additional follow-up appointments at this time since is being passed to Dr. dew for further evaluation and management of his right lower extremity which is likely gonna require below the knee amputation. If anything changes or worsens meantime the patient will contact our office and let me know otherwise I did discuss this with the patient as well as his family today and she was also in agreement with the plan. Electronic Signature(s) Signed: 05/09/2018 5:00:41 PM By: Worthy Keeler PA-C Entered By: Worthy Keeler on 05/09/2018 16:46:08 [name], [name] ([id number]) -------------------------------------------------------------------------------- ROS/PFSH Details Patient Name: Glenn Standard B. Date of Service: 05/09/2018 2:15 PM Medical Record Number: [id number] Patient Account Number: [id number] Date of Birth/Sex: 05-03-1956 (62 y.o. M) Treating RN: Roger Shelter Primary Care Provider: Park Liter Other Clinician: Referring Provider: Park Liter Treating Provider/Extender: Melburn Hake, Tajay Muzzy Weeks  in Treatment: 9 Information Obtained From Patient Wound History Do you currently have one or more open woundso Yes How many open wounds do you currently haveo 2 Approximately how long have you had your woundso 2 month How have you been treating your wound(s) until nowo neosporin or iodine Has your wound(s) ever healed and then re-openedo No Have you had any lab work done in the past montho Yes Who ordered the lab work doneo Dr. Wynetta Emery Have you tested positive for an antibiotic resistant organism (MRSA, VRE)o No Have you tested positive for osteomyelitis (bone infection)o No Have you had any tests for circulation on your legso Yes Where was the test doneo Dr. Lucky Cowboy Constitutional Symptoms (General Health) Complaints and Symptoms: Negative for: Fever; Chills Eyes Medical History: Negative for: Cataracts; Glaucoma; Optic Neuritis Ear/Nose/Mouth/Throat Medical History: Negative for: Chronic sinus problems/congestion; Middle ear problems Hematologic/Lymphatic Medical History: Negative for: Anemia; Hemophilia; Human Immunodeficiency Virus; Lymphedema; Sickle Cell Disease Respiratory Complaints and Symptoms: No Complaints or Symptoms Medical History: Negative for: Aspiration; Asthma; Chronic Obstructive Pulmonary Disease (COPD); Pneumothorax; Sleep Apnea; Tuberculosis Cardiovascular Complaints and Symptoms: No Complaints or Symptoms [name], [name] ([id number]) Medical History: Positive for: Arrhythmia - a-fib; Hypertension; Peripheral Arterial Disease Negative for: Angina; Congestive Heart Failure; Coronary Artery Disease; Deep Vein Thrombosis; Hypotension; Myocardial Infarction; Peripheral Venous Disease; Phlebitis; Vasculitis Gastrointestinal Medical History: Negative for: Cirrhosis ; Colitis; Crohnos; Hepatitis A; Hepatitis B; Hepatitis C Endocrine Medical History: Positive for: Type II Diabetes Negative for: Type I Diabetes Time with diabetes: 5 years Treated with: Oral  agents Blood sugar tested every day: No Genitourinary Medical History: Negative for: End Stage Renal Disease Immunological Medical History: Negative for: Lupus Erythematosus; Raynaudos; Scleroderma Integumentary (Skin) Medical History: Negative for: History of Burn; History of pressure wounds Musculoskeletal Medical History: Negative for: Gout; Rheumatoid Arthritis; Osteoarthritis; Osteomyelitis Neurologic Medical History: Positive for: Dementia - stroke Negative for: Neuropathy; Quadriplegia; Paraplegia; Seizure Disorder Oncologic Medical History: Negative for: Received Chemotherapy; Received Radiation Psychiatric Complaints and  Symptoms: No Complaints or Symptoms Medical History: Negative for: Anorexia/bulimia; Confinement Anxiety [name], [name] ([id number]) Immunizations Pneumococcal Vaccine: Received Pneumococcal Vaccination: Yes Implantable Devices Hospitalization / Surgery History Name of Hospital Purpose of Hospitalization/Surgery Date [facility] Stroke 06/03/2017 Family and Social History Cancer: No; Diabetes: No; Heart Disease: No; Hypertension: No; Kidney Disease: No; Lung Disease: No; Seizures: No; Stroke: No; Thyroid Problems: No; Tuberculosis: No; Former smoker - ended on 01/31/2018; Marital Status - Married; Alcohol Use: Moderate; Drug Use: No History; Caffeine Use: Daily; Advanced Directives: No; Patient does not want information on Advanced Directives; Living Will: No; Medical Power of Attorney: No Physician Affirmation I have reviewed and agree with the above information. Electronic Signature(s) Signed: 05/09/2018 5:00:41 PM By: Worthy Keeler PA-C Signed: 05/10/2018 1:28:26 PM By: Roger Shelter Entered By: Worthy Keeler on 05/09/2018 16:44:29 [place], [name] ([id number]) -------------------------------------------------------------------------------- SuperBill Details Patient Name: Glenn Standard B. Date of Service: 05/09/2018 Medical Record Number:  [id number] Patient Account Number: [id number] Date of Birth/Sex: 11-22-1955 (62 y.o. M) Treating RN: Roger Shelter Primary Care Provider: Park Liter Other Clinician: Referring Provider: Park Liter Treating Provider/Extender: Melburn Hake, Lavita Pontius Weeks in Treatment: 9 Diagnosis Coding ICD-10 Codes Code Description E11.621 Type 2 diabetes mellitus with foot ulcer S91.101S Unspecified open wound of right great toe without damage to nail, sequela R23.0 Cyanosis I73.9 Peripheral vascular disease, unspecified Facility Procedures CPT4 Code: [id number] Description: 99213 - WOUND CARE VISIT-LEV 3 EST PT Modifier: Quantity: 1 Physician Procedures CPT4 Code: [id number] Description: 73532 - WC PHYS LEVEL 3 - EST PT ICD-10 Diagnosis Description E11.621 Type 2 diabetes mellitus with foot ulcer S91.101S Unspecified open wound of right great toe without damage to R23.0 Cyanosis I73.9 Peripheral vascular disease, unspecified Modifier: nail, sequela Quantity: 1 Electronic Signature(s) Signed: 05/09/2018 5:00:41 PM By: Worthy Keeler PA-C Previous Signature: 05/09/2018 4:35:19 PM Version By: Roger Shelter Entered By: Worthy Keeler on 05/09/2018 16:47:59

## 2018-05-12 NOTE — Anesthesia Postprocedure Evaluation (Signed)
Anesthesia Post Note  Patient: [name].  Procedure(s) Performed: COLONOSCOPY WITH PROPOFOL (N/A ) ESOPHAGOGASTRODUODENOSCOPY (EGD) WITH PROPOFOL (N/A )  Patient location during evaluation: Endoscopy Anesthesia Type: General Level of consciousness: awake and alert Pain management: pain level controlled Vital Signs Assessment: vitals unstable Respiratory status: spontaneous breathing, nonlabored ventilation and respiratory function stable Cardiovascular status: blood pressure returned to baseline and stable Postop Assessment: no apparent nausea or vomiting Anesthetic complications: no Comments: Patient with Afib with RVR with a rate in the 140s-170.  Gave 25 mg PO metoprolol per cardiology recommendation.  Tried to get patient to stay to be admitted for rate controlling, but he refused and left AMA.  Told him to come back for any signs/symptoms of a heart attack or stroke.  Cards will reach out and try to see him in the office.     Last Vitals:  Vitals:   05/12/18 1059 05/12/18 1109  BP: 139/71 120/78  Pulse: 91 61  Resp: 17 14  Temp:    SpO2: 98% 97%    Last Pain:  Vitals:   05/12/18 1109  TempSrc:   PainSc: 0-No pain                 [name]

## 2018-05-12 NOTE — Telephone Encounter (Signed)
Spoke with pt wife and she agreed to have the pt seen here with Ignacia Bayley NP on Monday 05/16/18 at 1:30pm. Will call if the pt develops any problems prior to the appt.

## 2018-05-12 NOTE — Progress Notes (Signed)
Glenn, [name] ([id number]) Visit Report for 05/09/2018 Arrival Information Details Patient Name: [name], [name]. Date of Service: 05/09/2018 2:15 PM Medical Record Number: [id number] Patient Account Number: [id number] Date of Birth/Sex: Sep 16, 1955 (62 y.o. M) Treating RN: Roger Shelter Primary Care Katoria Yetman: Park Liter Other Clinician: Referring Africa Masaki: Park Liter Treating Jordynne Mccown/Extender: Melburn Hake, HOYT Weeks in Treatment: 9 Visit Information History Since Last Visit Added or deleted any medications: No Patient Arrived: Cane Any new allergies or adverse reactions: No Arrival Time: 14:34 Had a fall or experienced change in No Accompanied By: spouse activities of daily living that may affect Transfer Assistance: None risk of falls: Patient Identification Verified: Yes Signs or symptoms of abuse/neglect since last visito No Secondary Verification Process Yes Hospitalized since last visit: No Completed: Implantable device outside of the clinic excluding No Patient Has Alerts: Yes cellular tissue based products placed in the center Patient Alerts: Patient on Blood since last visit: Thinner Has Dressing in Place as Prescribed: Yes Eliquis and 81mg  Pain Present Now: Yes Aspirin Type II Diabetes Electronic Signature(s) Signed: 05/09/2018 4:30:19 PM By: Lorine Bears RCP, RRT, CHT Entered By: Lorine Bears on 05/09/2018 14:36:42 Millar, Louie [name] ([id number]) -------------------------------------------------------------------------------- Clinic Level of Care Assessment Details Patient Name: Glenn Standard B. Date of Service: 05/09/2018 2:15 PM Medical Record Number: [id number] Patient Account Number: [id number] Date of Birth/Sex: 1956/06/10 (62 y.o. M) Treating RN: Roger Shelter Primary Care Mackinsey Pelland: Park Liter Other Clinician: Referring Seth Higginbotham: Park Liter Treating Mattix Imhof/Extender: Melburn Hake, HOYT Weeks in  Treatment: 9 Clinic Level of Care Assessment Items TOOL 4 Quantity Score X - Use when only an EandM is performed on FOLLOW-UP visit 1 0 ASSESSMENTS - Nursing Assessment / Reassessment X - Reassessment of Co-morbidities (includes updates in patient status) 1 10 X- 1 5 Reassessment of Adherence to Treatment Plan ASSESSMENTS - Wound and Skin Assessment / Reassessment X - Simple Wound Assessment / Reassessment - one wound 1 5 []  - 0 Complex Wound Assessment / Reassessment - multiple wounds []  - 0 Dermatologic / Skin Assessment (not related to wound area) ASSESSMENTS - Focused Assessment []  - Circumferential Edema Measurements - multi extremities 0 []  - 0 Nutritional Assessment / Counseling / Intervention []  - 0 Lower Extremity Assessment (monofilament, tuning fork, pulses) []  - 0 Peripheral Arterial Disease Assessment (using hand held doppler) ASSESSMENTS - Ostomy and/or Continence Assessment and Care []  - Incontinence Assessment and Management 0 []  - 0 Ostomy Care Assessment and Management (repouching, etc.) PROCESS - Coordination of Care X - Simple Patient / Family Education for ongoing care 1 15 []  - 0 Complex (extensive) Patient / Family Education for ongoing care []  - 0 Staff obtains Programmer, systems, Records, Test Results / Process Orders []  - 0 Staff telephones HHA, Nursing Homes / Clarify orders / etc []  - 0 Routine Transfer to another Facility (non-emergent condition) []  - 0 Routine Hospital Admission (non-emergent condition) []  - 0 New Admissions / Biomedical engineer / Ordering NPWT, Apligraf, etc. []  - 0 Emergency Hospital Admission (emergent condition) X- 1 10 Simple Discharge Coordination Caley, Kanishk B. ([id number]) []  - 0 Complex (extensive) Discharge Coordination PROCESS - Special Needs []  - Pediatric / Minor Patient Management 0 []  - 0 Isolation Patient Management []  - 0 Hearing / Language / Visual special needs []  - 0 Assessment of Community  assistance (transportation, D/C planning, etc.) []  - 0 Additional assistance / Altered mentation []  - 0 Support Surface(s) Assessment (bed, cushion, seat, etc.) INTERVENTIONS - Wound Cleansing / Measurement X -  Simple Wound Cleansing - one wound 1 5 []  - 0 Complex Wound Cleansing - multiple wounds X- 1 5 Wound Imaging (photographs - any number of wounds) []  - 0 Wound Tracing (instead of photographs) X- 1 5 Simple Wound Measurement - one wound []  - 0 Complex Wound Measurement - multiple wounds INTERVENTIONS - Wound Dressings X - Small Wound Dressing one or multiple wounds 3 10 []  - 0 Medium Wound Dressing one or multiple wounds []  - 0 Large Wound Dressing one or multiple wounds []  - 0 Application of Medications - topical []  - 0 Application of Medications - injection INTERVENTIONS - Miscellaneous []  - External ear exam 0 []  - 0 Specimen Collection (cultures, biopsies, blood, body fluids, etc.) []  - 0 Specimen(s) / Culture(s) sent or taken to Lab for analysis []  - 0 Patient Transfer (multiple staff / Civil Service fast streamer / Similar devices) []  - 0 Simple Staple / Suture removal (25 or less) []  - 0 Complex Staple / Suture removal (26 or more) []  - 0 Hypo / Hyperglycemic Management (close monitor of Blood Glucose) []  - 0 Ankle / Brachial Index (ABI) - do not check if billed separately X- 1 5 Vital Signs Rawlinson, Broedy B. ([id number]) Has the patient been seen at the hospital within the last three years: Yes Total Score: 95 Level Of Care: New/Established - Level 3 Electronic Signature(s) Signed: 05/09/2018 4:35:19 PM By: Roger Shelter Entered By: Roger Shelter on 05/09/2018 15:25:40 Glenn, Louie [name] ([id number]) -------------------------------------------------------------------------------- Encounter Discharge Information Details Patient Name: Glenn Standard B. Date of Service: 05/09/2018 2:15 PM Medical Record Number: [id number] Patient Account Number: [id number] Date of  Birth/Sex: 08/13/1955 (62 y.o. M) Treating RN: Roger Shelter Primary Care Winni Ehrhard: Park Liter Other Clinician: Referring Rozlyn Yerby: Park Liter Treating Terren Jandreau/Extender: Melburn Hake, HOYT Weeks in Treatment: 9 Encounter Discharge Information Items Discharge Condition: Stable Ambulatory Status: Cane Discharge Destination: Home Transportation: Private Auto Schedule Follow-up Appointment: No Clinical Summary of Care: Electronic Signature(s) Signed: 05/09/2018 4:35:19 PM By: Roger Shelter Entered By: Roger Shelter on 05/09/2018 15:26:41 Huckaba, Louie [name] ([id number]) -------------------------------------------------------------------------------- Lower Extremity Assessment Details Patient Name: Glenn Standard B. Date of Service: 05/09/2018 2:15 PM Medical Record Number: [id number] Patient Account Number: [id number] Date of Birth/Sex: 1956/02/26 (62 y.o. M) Treating RN: Montey Hora Primary Care Mariely Mahr: Park Liter Other Clinician: Referring Dontarius Sheley: Park Liter Treating Naimah Yingst/Extender: Melburn Hake, HOYT Weeks in Treatment: 9 Vascular Assessment Claudication: Claudication Assessment [Right:Rest Pain] Pulses: Dorsalis Pedis Palpable: [Right:No] Doppler Audible: [Right:Yes] Posterior Tibial Palpable: [Right:No] Doppler Audible: [Right:Yes] Extremity colors, hair growth, and conditions: Extremity Color: [Right:Dusky] Hair Growth on Extremity: [Right:Yes] Temperature of Extremity: [Right:Warm] Capillary Refill: [Right:< 3 seconds] Toe Nail Assessment Left: Right: Thick: Yes Discolored: Yes Deformed: No Improper Length and Hygiene: Yes Electronic Signature(s) Signed: 05/09/2018 4:37:12 PM By: Montey Hora Entered By: Montey Hora on 05/09/2018 14:59:44 Jaye, Jourdin B. ([id number]) -------------------------------------------------------------------------------- Multi Wound Chart Details Patient Name: Glenn Standard B. Date of Service: 05/09/2018  2:15 PM Medical Record Number: [id number] Patient Account Number: [id number] Date of Birth/Sex: [birth year] (62 y.o. M) Treating RN: Roger Shelter Primary Care Avelynn Sellin: Park Liter Other Clinician: Referring Latori Beggs: Park Liter Treating Ileta Ofarrell/Extender: Melburn Hake, HOYT Weeks in Treatment: 9 Vital Signs Height(in): 70 Pulse(bpm): 99 Weight(lbs): 185 Blood Pressure(mmHg): 124/73 Body Mass Index(BMI): 27 Temperature(F): 98.3 Respiratory Rate 16 (breaths/min): Photos: [N/A:N/A] Wound Location: Right Foot - Distal, N/A N/A Circumfernential Wounding Event: Gradually Appeared N/A N/A Primary Etiology: Arterial Insufficiency Ulcer N/A N/A Comorbid History: Arrhythmia, Hypertension, N/A N/A Peripheral Arterial Disease, Type  II Diabetes, Dementia Date Acquired: 01/01/2018 N/A N/A Weeks of Treatment: 9 N/A N/A Wound Status: Amputation - Anticipated N/A N/A Pending Amputation on Yes N/A N/A Presentation: Measurements L x W x D 9x24x0.1 N/A N/A (cm) Area (cm) : 169.646 N/A N/A Volume (cm) : 16.965 N/A N/A % Reduction in Area: -4808.70% N/A N/A % Reduction in Volume: -4803.20% N/A N/A Classification: Unclassifiable N/A N/A Exudate Amount: None Present N/A N/A Wound Margin: Indistinct, nonvisible N/A N/A Granulation Amount: None Present (0%) N/A N/A Necrotic Amount: Large (67-100%) N/A N/A Necrotic Tissue: Eschar N/A N/A Exposed Structures: Fascia: No N/A N/A Fat Layer (Subcutaneous Tissue) Exposed: No Tendon: No Muscle: No Rosenberg, Lyndle B. ([id number]) Joint: No Bone: No Epithelialization: None N/A N/A Periwound Skin Texture: No Abnormalities Noted N/A N/A Periwound Skin Moisture: No Abnormalities Noted N/A N/A Periwound Skin Color: Ecchymosis: Yes N/A N/A Mottled: Yes Temperature: No Abnormality N/A N/A Tenderness on Palpation: Yes N/A N/A Wound Preparation: Ulcer Cleansing: Not N/A N/A Cleansed: dry eschar Topical Anesthetic Applied: None, Other:  lidocaine 4% Treatment Notes Electronic Signature(s) Signed: 05/09/2018 4:35:19 PM By: Roger Shelter Entered By: Roger Shelter on 05/09/2018 15:17:12 Gilday, Louie [name] ([id number]) -------------------------------------------------------------------------------- [place] Details Patient Name: Glenn Standard B. Date of Service: 05/09/2018 2:15 PM Medical Record Number: [id number] Patient Account Number: [id number] Date of Birth/Sex: [birth year] (62 y.o. M) Treating RN: Roger Shelter Primary Care Prentiss Hammett: Park Liter Other Clinician: Referring Torrin Crihfield: Park Liter Treating Sylvania Moss/Extender: Melburn Hake, HOYT Weeks in Treatment: 9 Active Inactive ` Abuse / Safety / Falls / Self Care Management Nursing Diagnoses: Potential for falls Goals: Patient will not experience any injury related to falls Date Initiated: 03/03/2018 Target Resolution Date: 07/09/2018 Goal Status: Active Interventions: Assess Activities of Daily Living upon admission and as needed Assess fall risk on admission and as needed Assess: immobility, friction, shearing, incontinence upon admission and as needed Assess impairment of mobility on admission and as needed per policy Assess personal safety and home safety (as indicated) on admission and as needed Assess self care needs on admission and as needed Notes: ` Nutrition Nursing Diagnoses: Imbalanced nutrition Impaired glucose control: actual or potential Potential for alteratiion in Nutrition/Potential for imbalanced nutrition Goals: Patient/caregiver agrees to and verbalizes understanding of need to use nutritional supplements and/or vitamins as prescribed Date Initiated: 03/03/2018 Target Resolution Date: 07/09/2018 Goal Status: Active Patient/caregiver will maintain therapeutic glucose control Date Initiated: 03/03/2018 Target Resolution Date: 06/11/2018 Goal Status: Active Interventions: Assess patient nutrition upon admission  and as needed per policy Provide education on elevated blood sugars and impact on wound healing Provide education on nutrition AMRAM, MAYA ([id number]) Notes: ` Orientation to the Wound Care Program Nursing Diagnoses: Knowledge deficit related to the wound healing center program Goals: Patient/caregiver will verbalize understanding of the [place] Date Initiated: 03/03/2018 Target Resolution Date: 04/09/2018 Goal Status: Active Interventions: Provide education on orientation to the wound center Notes: ` Wound/Skin Impairment Nursing Diagnoses: Impaired tissue integrity Knowledge deficit related to ulceration/compromised skin integrity Goals: Ulcer/skin breakdown will have a volume reduction of 80% by week 12 Date Initiated: 03/03/2018 Target Resolution Date: 07/02/2018 Goal Status: Active Interventions: Assess patient/caregiver ability to perform ulcer/skin care regimen upon admission and as needed Assess ulceration(s) every visit Notes: Electronic Signature(s) Signed: 05/09/2018 4:35:19 PM By: Roger Shelter Entered By: Roger Shelter on 05/09/2018 15:17:04 Leap, Kohen B. ([id number]) -------------------------------------------------------------------------------- Pain Assessment Details Patient Name: Glenn Standard B. Date of Service: 05/09/2018 2:15 PM Medical Record Number: [id number] Patient Account Number:  [id number] Date of Birth/Sex: [birth year] (62 y.o. M) Treating RN: Roger Shelter Primary Care Agnes Brightbill: Park Liter Other Clinician: Referring Troy Hartzog: Park Liter Treating Izaya Netherton/Extender: Melburn Hake, HOYT Weeks in Treatment: 9 Active Problems Location of Pain Severity and Description of Pain Patient Has Paino Yes Site Locations Rate the pain. Current Pain Level: 4 Pain Management and Medication Current Pain Management: Electronic Signature(s) Signed: 05/09/2018 4:30:19 PM By: Lorine Bears RCP, RRT,  CHT Signed: 05/09/2018 4:35:19 PM By: Roger Shelter Entered By: Lorine Bears on 05/09/2018 14:37:03 Korell, Louie [name] ([id number]) -------------------------------------------------------------------------------- Patient/Caregiver Education Details Patient Name: [name] Date of Service: 05/09/2018 2:15 PM Medical Record Number: [id number] Patient Account Number: [id number] Date of Birth/Gender: 12-Nov-1955 (62 y.o. M) Treating RN: Roger Shelter Primary Care Physician: Park Liter Other Clinician: Referring Physician: Park Liter Treating Physician/Extender: Sharalyn Ink in Treatment: 9 Education Assessment Education Provided To: Patient Education Topics Provided Wound/Skin Impairment: Handouts: Caring for Your Ulcer, Skin Care Do's and Dont's Methods: Explain/Verbal Responses: State content correctly Electronic Signature(s) Signed: 05/09/2018 4:35:19 PM By: Roger Shelter Entered By: Roger Shelter on 05/09/2018 15:26:46 Ayyad, Louie [name] ([id number]) -------------------------------------------------------------------------------- Wound Assessment Details Patient Name: Glenn Standard B. Date of Service: 05/09/2018 2:15 PM Medical Record Number: [id number] Patient Account Number: [id number] Date of Birth/Sex: [birth year] (62 y.o. 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, Circumfernential Wound Amputation - Anticipated Wounding Event: Gradually Appeared Status: Date Acquired: 01/01/2018 Outcome Below Knee Weeks Of Treatment: 9 Level: Clustered Wound: No Comorbid Arrhythmia, Hypertension, Peripheral Arterial Pending Amputation On Presentation History: Disease, Type II Diabetes,  Dementia Photos Photo Uploaded By: Secundino Ginger on 05/09/2018 15:08:54 Wound Measurements Length: (cm) 9 Width: (cm) 24 Depth: (cm) 0.1 Area: (cm) 169.646 Volume: (cm) 16.965 % Reduction in Area: -4808.7% % Reduction in Volume: -4803.2% Epithelialization: None Wound Description Classification: Unclassifiable Wound Margin: Indistinct, nonvisible Exudate Amount: None Present Foul Odor After Cleansing: No Slough/Fibrino No Wound Bed Granulation Amount: None Present (0%) Exposed Structure Necrotic Amount: Large (67-100%) Fascia Exposed: No Necrotic Quality: Eschar Fat Layer (Subcutaneous Tissue) Exposed: No Tendon Exposed: No Muscle Exposed: No Joint Exposed: No Bone Exposed: No Periwound Skin Texture Texture Color Benett, Trequan B. ([id number]) No Abnormalities Noted: No No Abnormalities Noted: No Ecchymosis: Yes Moisture Mottled: Yes No Abnormalities Noted: No Temperature / Pain Temperature: No Abnormality Tenderness on Palpation: Yes Wound Preparation Ulcer Cleansing: Not Cleansed: dry eschar, Topical Anesthetic Applied: None, Other: lidocaine 4%, Electronic Signature(s) Signed: 05/09/2018 2:59:18 PM By: Gretta Cool, BSN, RN, CWS, Kim RN, BSN Entered By: Gretta Cool, BSN, RN, CWS, Kim on 05/09/2018 14:59:17 Gaby, Louie [name] ([id number]) -------------------------------------------------------------------------------- Vitals Details Patient Name: Glenn Standard B. Date of Service: 05/09/2018 2:15 PM Medical Record Number: [id number] Patient Account Number: [id number] Date of Birth/Sex: 12-04-1955 (62 y.o. M) Treating RN: Roger Shelter Primary Care Catalia Massett: Park Liter Other Clinician: Referring Nachmen Mansel: Park Liter Treating Erle Guster/Extender: Melburn Hake, HOYT Weeks in Treatment: 9 Vital Signs Time Taken: 14:35 Temperature (F): 98.3 Height (in): 70 Pulse (bpm): 78 Weight (lbs): 185 Respiratory Rate (breaths/min): 16 Body Mass Index (BMI): 26.5 Blood  Pressure (mmHg): 124/73 Reference Range: 80 - 120 mg / dl Electronic Signature(s) Signed: 05/09/2018 4:30:19 PM By: Lorine Bears RCP, RRT, CHT Entered By: Lorine Bears on 05/09/2018 14:38:56

## 2018-05-12 NOTE — H&P (Signed)
[name], MD [address] [place], [place], [place], [address] [address], [place], [place], [place], [address] Phone: [phone]  Fax: [phone]  Primary Care Physician:  [name], DO   Pre-Procedure History & Physical: HPI:  [name]. is a 62 y.o. male is here for an endoscopy and colonoscopy    Past Medical History:  Diagnosis Date  . Anemia   . Diabetes mellitus with complication ([facility])   . History of hernia repair   . Hyperlipidemia   . Hypertension   . Legally blind   . PAF (paroxysmal atrial fibrillation) ([name])    a. on Eliquis as of 2018; b. CHADS2VASc => 5 (HTN, DM, stroke x 2, vascular disease)  . Peripheral vascular disease ([place])    a. followed by Dr. Lucky Cowboy; b. s/p kissing balloon stents and right external iliac stent in 10/2017; c. LE angiogrpahy 01/2018: No significant arterial occlusive disease in the lower extremities.  . Stroke [facility])    a. 2016 & 2018    Past Surgical History:  Procedure Laterality Date  . APPENDECTOMY    . HERNIA REPAIR     UMBILICAL  . LOWER EXTREMITY ANGIOGRAPHY Right 10/25/2017   Procedure: LOWER EXTREMITY ANGIOGRAPHY;  Surgeon: [name], MD;  Location: [facility];  Service: Cardiovascular;  Laterality: Right;  . LOWER EXTREMITY ANGIOGRAPHY Right 01/13/2018   Procedure: LOWER EXTREMITY ANGIOGRAPHY;  Surgeon: [name], MD;  Location: [facility];  Service: Cardiovascular;  Laterality: Right;  . LOWER EXTREMITY INTERVENTION  10/25/2017   Procedure: LOWER EXTREMITY INTERVENTION;  Surgeon: [name], MD;  Location: [facility];  Service: Cardiovascular;;  . TONSILLECTOMY      Prior to Admission medications   Medication Sig Start Date End Date Taking? Authorizing Provider  acetaminophen (TYLENOL) 500 MG tablet Take 1,000 mg by mouth every 6 (six) hours as needed (for pain.).    [provider]  apixaban (ELIQUIS) 5 MG TABS tablet Take 1 tablet (5 mg total) by  mouth 2 (two) times daily. 05/06/18   Johnson, Megan P, DO  aspirin 81 MG EC tablet Take 1 tablet (81 mg total) by mouth daily. 02/21/18   Johnson, Megan P, DO  Biotin (BIOTIN 5000) 5 MG CAPS Take 1 capsule by mouth daily.    [provider]  cholecalciferol (VITAMIN D) 1000 units tablet Take 1,000 Units by mouth daily.    [provider]  empagliflozin (JARDIANCE) 10 MG TABS tablet Take 5 mg by mouth daily. 05/06/18   Johnson, Megan P, DO  ezetimibe-simvastatin (VYTORIN) 10-40 MG tablet Take 1 tablet by mouth daily at 6 PM. 02/14/18   Gollan, Kathlene November, MD  ferrous sulfate (FERROUSUL) 325 (65 FE) MG tablet Take 1 tablet (325 mg total) by mouth daily with breakfast. 04/01/18   Wynetta Emery, Megan P, DO  gabapentin (NEURONTIN) 400 MG capsule Take 1 capsule (400 mg total) by mouth 3 (three) times daily. Patient taking differently: Take 400 mg by mouth every 8 (eight) hours as needed (for pain).  03/31/18   Johnson, Megan P, DO  hydrochlorothiazide (MICROZIDE) 12.5 MG capsule Take 1 capsule (12.5 mg total) by mouth daily. 04/06/18 07/05/18  Rise Mu, PA-C  metFORMIN (GLUCOPHAGE) 1000 MG tablet Take 1 tablet (1,000 mg total) by mouth 2 (two) times daily with a meal. 05/06/18   Johnson, Megan P, DO  metoprolol tartrate (LOPRESSOR) 25 MG tablet Take 1 tablet (25  mg total) by mouth 2 (two) times daily. 04/11/18   Rise Mu, PA-C  Multiple Vitamin (MULTIVITAMIN WITH MINERALS) TABS tablet Take 1 tablet by mouth daily. 06/24/17   Fritzi Mandes, MD  oxyCODONE-acetaminophen (PERCOCET) 10-325 MG tablet Take 1 tablet by mouth every 6 (six) hours as needed for pain. 05/06/18 09/03/18  Park Liter P, DO  sulfamethoxazole-trimethoprim (BACTRIM DS,SEPTRA DS) 800-160 MG tablet Take 1 tablet by mouth 2 (two) times daily. 05/03/18   Volney American, PA-C  vitamin C (ASCORBIC ACID) 500 MG tablet Take 500 mg by mouth daily.    [provider]    Allergies as of 05/02/2018 - Review Complete 04/28/2018   Allergen Reaction Noted  . Sodium pentobarbital [pentobarbital] Shortness Of Breath 01/13/2018  . Lipitor [atorvastatin] Rash 03/15/2015    Family History  Problem Relation Age of Onset  . Diabetes Father   . Hypertension Father   . Hyperlipidemia Father     Social History   Socioeconomic History  . Marital status: Married    Spouse name: Not on file  . Number of children: 0  . Years of education: Not on file  . Highest education level: Not on file  Occupational History  . Not on file  Social Needs  . Financial resource strain: Not on file  . Food insecurity:    Worry: Not on file    Inability: Not on file  . Transportation needs:    Medical: Not on file    Non-medical: Not on file  Tobacco Use  . Smoking status: Former Smoker    Packs/day: 1.00    Years: 30.00    Pack years: 30.00    Types: Cigarettes    Last attempt to quit: 01/09/2018    Years since quitting: 0.3  . Smokeless tobacco: Never Used  Substance and Sexual Activity  . Alcohol use: Yes    Alcohol/week: 8.0 standard drinks    Types: 8 Cans of beer per week    Comment: occasionally beer   . Drug use: No  . Sexual activity: Yes  Lifestyle  . Physical activity:    Days per week: Not on file    Minutes per session: Not on file  . Stress: Not on file  Relationships  . Social connections:    Talks on phone: Not on file    Gets together: Not on file    Attends religious service: Not on file    Active member of club or organization: Not on file    Attends meetings of clubs or organizations: Not on file    Relationship status: Not on file  . Intimate partner violence:    Fear of current or ex partner: Not on file    Emotionally abused: Not on file    Physically abused: Not on file    Forced sexual activity: Not on file  Other Topics Concern  . Not on file  Social History Narrative  . Not on file    Review of Systems: See HPI, otherwise negative ROS  Physical Exam: There were no vitals taken  for this visit. General:   Alert,  pleasant and cooperative in NAD Head:  Normocephalic and atraumatic. Neck:  Supple; no masses or thyromegaly. Lungs:  Clear throughout to auscultation, normal respiratory effort.    Heart:  +S1, +S2, Regular rate and rhythm, No edema. Abdomen:  Soft, nontender and nondistended. Normal bowel sounds, without guarding, and without rebound.   Neurologic:  Alert and  oriented x4;  grossly normal neurologically.  Impression/Plan: Glenn B Basara Jr. is here for an endoscopy and colonoscopy  to be performed for  evaluation of iron deficiency anemia    Risks, benefits, limitations, and alternatives regarding endoscopy have been reviewed with the patient.  Questions have been answered.  All parties agreeable.   [name], MD  05/12/2018, 9:07 AM

## 2018-05-12 NOTE — Progress Notes (Signed)
    Patient left AMA prior to cardiology consult despite multiple MDs and staff members advising him otherwise. I will have the office contact the patient for follow up of his Afib.

## 2018-05-13 ENCOUNTER — Encounter (INDEPENDENT_AMBULATORY_CARE_PROVIDER_SITE_OTHER): Payer: Self-pay | Admitting: Vascular Surgery

## 2018-05-13 ENCOUNTER — Other Ambulatory Visit: Payer: Self-pay | Admitting: Family Medicine

## 2018-05-13 ENCOUNTER — Encounter: Payer: Self-pay | Admitting: Family Medicine

## 2018-05-13 ENCOUNTER — Ambulatory Visit (INDEPENDENT_AMBULATORY_CARE_PROVIDER_SITE_OTHER): Payer: Medicare HMO | Admitting: Vascular Surgery

## 2018-05-13 VITALS — BP 106/67 | HR 83 | Ht 68.5 in | Wt 178.0 lb

## 2018-05-13 DIAGNOSIS — E782 Mixed hyperlipidemia: Secondary | ICD-10-CM

## 2018-05-13 DIAGNOSIS — I96 Gangrene, not elsewhere classified: Secondary | ICD-10-CM

## 2018-05-13 DIAGNOSIS — Z87891 Personal history of nicotine dependence: Secondary | ICD-10-CM

## 2018-05-13 DIAGNOSIS — I10 Essential (primary) hypertension: Secondary | ICD-10-CM

## 2018-05-13 DIAGNOSIS — I70261 Atherosclerosis of native arteries of extremities with gangrene, right leg: Secondary | ICD-10-CM | POA: Diagnosis not present

## 2018-05-13 DIAGNOSIS — E1142 Type 2 diabetes mellitus with diabetic polyneuropathy: Secondary | ICD-10-CM | POA: Diagnosis not present

## 2018-05-13 DIAGNOSIS — D500 Iron deficiency anemia secondary to blood loss (chronic): Secondary | ICD-10-CM

## 2018-05-13 DIAGNOSIS — I480 Paroxysmal atrial fibrillation: Secondary | ICD-10-CM | POA: Diagnosis not present

## 2018-05-13 LAB — SURGICAL PATHOLOGY

## 2018-05-13 MED ORDER — SULFAMETHOXAZOLE-TRIMETHOPRIM 800-160 MG PO TABS: 1.0000 | ORAL_TABLET | Freq: Two times a day (BID) | ORAL | 0 refills | Status: DC

## 2018-05-13 NOTE — Assessment & Plan Note (Signed)
Now on Eliquis.  From his peripheral vascular disease, he is going to need an amputation and I would be okay if he comes off Eliquis.  That being said, I think this is more important for his cardiac arrhythmias and I would definitely discuss this with his cardiologist before we remove Eliquis.

## 2018-05-13 NOTE — Patient Instructions (Signed)
Leg Amputation Leg amputation is a procedure to remove the leg. You may have this procedure if your leg has been affected by a disease, such as peripheral artery disease or severe circulation disease, and is causing problems. There are three types of leg amputation:  Above-the-knee amputation (transfemoral amputation). This type is done to remove the leg from above the knee.  Below-the-knee amputation (transtibial amputation). This type is done to remove the leg from below the knee.  Through-knee amputation. This type is done to remove the leg at the knee joint.  Tell a health care provider about:  Any allergies you have.  All medicines you are taking, including vitamins, herbs, eye drops, creams, and over-the-counter medicines.  Any problems you or family members have had with anesthetic medicines.  Any blood disorders you have.  Any surgeries you have had.  Any medical conditions you have.  Whether you are pregnant or may be pregnant. What are the risks? Generally, this is a safe procedure. However, problems may occur, including:  Infection.  Bleeding.  Allergic reactions to medicines.  Stiffening of the hip and knee joint (hip and knee contracture).  Blood clot in a deep vein (deep vein thrombosis, orDVT), usually in the leg.  A blood clot in your lung (pulmonary embolism).  What happens before the procedure? Staying hydrated Follow instructions from your health care provider about hydration, which may include:  Up to 2 hours before the procedure - you may continue to drink clear liquids, such as water, clear fruit juice, black coffee, and plain tea.  Eating and drinking restrictions Follow instructions from your health care provider about eating and drinking, which may include:  8 hours before the procedure - stop eating heavy meals or foods such as meat, fried foods, or fatty foods.  6 hours before the procedure - stop eating light meals or foods, such as toast  or cereal.  6 hours before the procedure - stop drinking milk or drinks that contain milk.  2 hours before the procedure - stop drinking clear liquids.  Medicines  Ask your health care provider about: ? Changing or stopping your regular medicines. This is especially important if you are taking diabetes medicines or blood thinners. ? Taking over-the-counter medicines, vitamins, herbs, and supplements. ? Taking medicines such as aspirin and ibuprofen. These medicines can thin your blood. Do not take these medicines unless your health care provider tells you to take them.  You may be given antibiotic medicine to help prevent an infection. If so, take the antibiotic as told by your health care provider. General instructions  Ask your health care provider how your surgical site will be marked or identified.  Plan to have someone take you home from the hospital or clinic.  Plan to have a responsible adult care for you for at least 24 hours after you leave the hospital or clinic. This is important. What happens during the procedure?  To lower your risk of infection: ? Your health care team will wash or sanitize their hands. ? Hair may be removed from the surgical area. ? Your skin will be washed with soap.  An IV will be inserted into one of your veins.  You will be given one or more of the following: ? A medicine to help you relax (sedative). ? A medicine to numb the area (local anesthetic). ? A medicine to make you fall asleep (general anesthetic). ? A medicine that is injected into your spine to numb the area below  and slightly above the injection site (spinal anesthetic). ? A medicine that is injected into an area of your body to numb everything below the injection site (regional anesthetic).  Damaged or diseased tissue and bone will be removed.  Uneven areas of bone will be smoothed.  Blood vessels and nerves will be closed off.  Muscles will be cut and reshaped so that an  artificial leg (prosthesis) can be attached to the stump.  The incision will be closed with stitches (sutures).  A bandage (dressing) will be placed over the incision. The procedure may vary among health care providers and hospitals. What happens after the procedure?  Your blood pressure, heart rate, breathing rate, and blood oxygen level will be monitored until the medicines you were given have worn off.  You will be given medicine for pain. The medicine may include: ? A sedative. ? A spinal anesthetic. ? A regional anesthetic.  You may start physical therapy within 24 hours after your surgery. Summary  Leg amputation is a procedure to remove the leg from above, below, or through the knee.  Follow instructions from your health care provider about eating or drinking restrictions before the procedure.  Before the procedure, ask your health care provider how your surgical site will be marked or identified. This information is not intended to replace advice given to you by your health care provider. Make sure you discuss any questions you have with your health care provider. Document Released: 10/28/2016 Document Revised: 10/28/2016 Document Reviewed: 10/28/2016 Elsevier Interactive Patient Education  [employer].

## 2018-05-13 NOTE — Assessment & Plan Note (Signed)
The disease has now progressed beyond the point where digital amputations will be appropriate.  Podiatry has said the foot is not salvageable and I would agree with its appearance today.  His disease has continued to progress despite normal large vessel flow.  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

## 2018-05-13 NOTE — Assessment & Plan Note (Signed)
The disease has now progressed beyond the point where digital amputations will be appropriate.  Podiatry has said the foot is not salvageable and I would agree with its appearance today.  His disease has continued to progress despite normal large vessel flow.  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 At this point, I would agree that his best chance of healing would be a below-knee amputation.  I would think he would be a reasonable candidate for a prosthesis after the procedure.  I have discussed the risks and benefits the procedure.  He is to see his cardiologist next week and we would appreciate input in regards to his cardiac risk with surgery.  His surgery will be scheduled for the upcoming weeks.  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

## 2018-05-13 NOTE — Assessment & Plan Note (Signed)
This has been a significant issue.  His colonoscopy was normal.  He is going to get a capsule endoscopy.  From a peripheral vascular standpoint, he could stop the Eliquis but I do not know if that would be okay with his cardiologist.

## 2018-05-13 NOTE — Progress Notes (Signed)
MRN : [id number]  [name]. is a 62 y.o. (Nov 25, 1955) male who presents with chief complaint of  Chief Complaint  Patient presents with  . Follow-up    ref [name] for pvd  .  History of Present Illness: Patient returns today in follow up of his PAD and gangrenous changes on his right foot.  Despite revascularization and having normal perfusion on several checks this year including an angiogram, he has had progression of his gangrenous changes on his right foot.  It started in his right fourth and fifth toes.  All 5 toes are now clearly gangrenous and the gangrenous changes have progressed up beyond the midfoot laterally almost to the heel and ankle.  It is not overtly painful at this time although it has had times of discomfort previously.  Over the past month to 6 weeks, the changes become much more dramatic and this has coincided with severe medical illnesses.  He has had GI bleeding with anemia.  He is on Eliquis for atrial fibrillation and a previous stroke as well as his peripheral vascular disease.  His right foot is more swollen and malodorous.  No fevers or chills.  He was recommended to stay in the hospital after his colonoscopy yesterday due to atrial fibrillation, but he declined.  No current left leg symptoms other than some mild swelling.  He has been followed by podiatry but his disease has progressed to the point where there do not think they have anything to offer and have recommended he have a below-knee amputation.  Current Outpatient Medications  Medication Sig Dispense Refill  . acetaminophen (TYLENOL) 500 MG tablet Take 1,000 mg by mouth every 6 (six) hours as needed (for pain.).    Marland Kitchen apixaban (ELIQUIS) 5 MG TABS tablet Take 1 tablet (5 mg total) by mouth 2 (two) times daily. 180 tablet 1  . aspirin 81 MG EC tablet Take 1 tablet (81 mg total) by mouth daily. 90 tablet 4  . Biotin (BIOTIN 5000) 5 MG CAPS Take 1 capsule by mouth daily.    . cholecalciferol (VITAMIN  D) 1000 units tablet Take 1,000 Units by mouth daily.    . empagliflozin (JARDIANCE) 10 MG TABS tablet Take 5 mg by mouth daily. 90 tablet 1  . ezetimibe-simvastatin (VYTORIN) 10-40 MG tablet Take 1 tablet by mouth daily at 6 PM. 90 tablet 3  . ferrous sulfate (FERROUSUL) 325 (65 FE) MG tablet Take 1 tablet (325 mg total) by mouth daily with breakfast. 90 tablet 3  . gabapentin (NEURONTIN) 400 MG capsule Take 1 capsule (400 mg total) by mouth 3 (three) times daily. (Patient taking differently: Take 400 mg by mouth every 8 (eight) hours as needed (for pain). ) 90 capsule 1  . hydrochlorothiazide (MICROZIDE) 12.5 MG capsule Take 1 capsule (12.5 mg total) by mouth daily. 90 capsule 3  . metFORMIN (GLUCOPHAGE) 1000 MG tablet Take 1 tablet (1,000 mg total) by mouth 2 (two) times daily with a meal. 180 tablet 3  . metoprolol tartrate (LOPRESSOR) 25 MG tablet Take 1 tablet (25 mg total) by mouth 2 (two) times daily. 180 tablet 3  . Multiple Vitamin (MULTIVITAMIN WITH MINERALS) TABS tablet Take 1 tablet by mouth daily. 30 tablet 0  . oxyCODONE-acetaminophen (PERCOCET) 10-325 MG tablet Take 1 tablet by mouth every 6 (six) hours as needed for pain. 90 tablet 0  . sulfamethoxazole-trimethoprim (BACTRIM DS,SEPTRA DS) 800-160 MG tablet Take 1 tablet by mouth 2 (two) times daily. [place]  tablet 0  . vitamin C (ASCORBIC ACID) 500 MG tablet Take 500 mg by mouth daily.     No current facility-administered medications for this visit.     Past Medical History:  Diagnosis Date  . Anemia   . Diabetes mellitus with complication ([place])   . History of hernia repair   . Hyperlipidemia   . Hypertension   . Legally blind   . PAF (paroxysmal atrial fibrillation) ([name])    a. on Eliquis as of 2018; b. CHADS2VASc => 5 (HTN, DM, stroke x 2, vascular disease)  . Peripheral vascular disease ([place])    a. followed by Dr. Lucky [name]; b. s/p kissing balloon stents and right external iliac stent in 10/2017; c. LE angiogrpahy 01/2018: No  significant arterial occlusive disease in the lower extremities.  . Stroke [facility])    a. 2016 & 2018    Past Surgical History:  Procedure Laterality Date  . APPENDECTOMY    . HERNIA REPAIR     UMBILICAL  . LOWER EXTREMITY ANGIOGRAPHY Right 10/25/2017   Procedure: LOWER EXTREMITY ANGIOGRAPHY;  Surgeon: Glenn Huxley, MD;  Location: [place] [facility];  Service: Cardiovascular;  Laterality: Right;  . LOWER EXTREMITY ANGIOGRAPHY Right 01/13/2018   Procedure: LOWER EXTREMITY ANGIOGRAPHY;  Surgeon: Glenn Huxley, MD;  Location: [place] [facility];  Service: Cardiovascular;  Laterality: Right;  . LOWER EXTREMITY INTERVENTION  10/25/2017   Procedure: LOWER EXTREMITY INTERVENTION;  Surgeon: Glenn Huxley, MD;  Location: [place] [facility];  Service: Cardiovascular;;  . TONSILLECTOMY     Social History        Tobacco Use  . Smoking status: Former Smoker    Packs/day: 0.25    Years: 30.00    Pack years: 7.50    Types: Cigarettes  . Smokeless tobacco: Never Used  Substance Use Topics  . Alcohol use: Yes    Alcohol/week: 4.8 oz    Types: 8 Cans of beer per week    Comment: occasionally  . Drug use: No    Family History Family History  Problem Relation Age of Onset  . Diabetes Father   . Hypertension Father   . Hyperlipidemia Father   no bleeding or clotting disorders       Allergies  Allergen Reactions  . Sodium Pentobarbital [Pentobarbital] Shortness Of Breath  . Lipitor [Atorvastatin] Rash  . Gabapentin Other (See Comments)    Other reaction(s): Dizziness    REVIEW OF SYSTEMS(Negative unless checked)  Constitutional: [] Weight loss[] Fever[] Chills Cardiac:[] Chest pain[] Chest pressure[] Palpitations [] Shortness of breath when laying flat [] Shortness of breath at rest [] Shortness of breath with exertion. Vascular: [x] Pain in legs with walking[] Pain in legsat rest[] Pain in legs when laying flat [] Claudication [] Pain in  feet when walking [] Pain in feet at rest [] Pain in feet when laying flat [] History of DVT [] Phlebitis [] Swelling in legs [] Varicose veins [x] Non-healing ulcers Pulmonary: [] Uses home oxygen [] Productive cough[] Hemoptysis [] Wheeze [] COPD [] Asthma Neurologic: [] Dizziness [] Blackouts [] Seizures [x] History of stroke [] History of TIA[] Aphasia [] Temporary blindness[] Dysphagia [x] Weaknessor numbness in arms [x] Weakness or numbnessin legs Musculoskeletal: [x] Arthritis [] Joint swelling [] Joint pain [] Low back pain Hematologic:[] Easy bruising[] Easy bleeding [] Hypercoagulable state [] Anemic  Gastrointestinal:[] Blood in stool[] Vomiting blood[] Gastroesophageal reflux/heartburn[] Abdominal pain Genitourinary: [] Chronic kidney disease [] Difficulturination [] Frequenturination [] Burning with urination[] Hematuria Skin: [] Rashes [x] Ulcers [x] Wounds Psychological: [] History of anxiety[] History of major depression.    Physical Examination  BP 106/67 (BP Location: Right Arm)   Pulse 83   Ht 5' 8.5" (1.74 m)   Wt 178 lb (80.7 kg)   HC 16" (40.6 cm)  BMI 26.67 kg/m  Gen:  WD/WN, NAD Head: [place]/AT, No temporalis wasting. Ear/Nose/Throat: Hearing grossly intact, nares w/o erythema or drainage Eyes: Conjunctiva clear. Sclera non-icteric Neck: Supple.  Trachea midline Pulmonary:  Good air movement, no use of accessory muscles.  Cardiac: irregular Vascular:  Vessel Right Left  Radial Palpable Palpable                          PT Not Palpable 1+ Palpable  DP 1+ Palpable 1+ Palpable    Musculoskeletal: M/S 5/5 throughout.  No deformity or atrophy. Mild LLE edema. Moderate right foot and leg edema.  Gangrenous changes involving all 5 toes on the right now spreading laterally up to the midfoot near the heel and up to the ankle on the top of the foot.  There is a foul odor from the right foot.  Mild to moderate drainage.   Not a lot of erythema to speak of. Neurologic: Sensation grossly intact in extremities.  Symmetrical.  Speech is somewhat labored and difficult to follow Psychiatric: Judgment and insight appear fair.  Wife provides a lot of the history Dermatologic: Right foot wounds as above       Labs Recent Results (from the past 2160 hour(s))  Bayer DCA Hb A1c Waived     Status: None   Collection Time: 02/21/18  4:04 PM  Result Value Ref Range   HB A1C (BAYER DCA - WAIVED) 6.6 <7.0 %    Comment:                                       Diabetic Adult            <7.0                                       Healthy Adult        4.3 - 5.7                                                           (DCCT/NGSP) American Diabetes Association's Summary of Glycemic Recommendations for Adults with Diabetes: Hemoglobin A1c <7.0%. More stringent glycemic goals (A1c <6.0%) may further reduce complications at the cost of increased risk of hypoglycemia.   CBC with Differential/Platelet     Status: Abnormal   Collection Time: 02/21/18  4:34 PM  Result Value Ref Range   WBC 10.1 3.4 - 10.8 x10E3/uL   RBC 5.04 4.14 - 5.80 x10E6/uL   Hemoglobin 12.8 (L) 13.0 - 17.7 g/dL   Hematocrit 39.3 37.5 - 51.0 %   MCV 78 (L) 79 - 97 fL   MCH 25.4 (L) 26.6 - 33.0 pg   MCHC 32.6 31.5 - 35.7 g/dL   RDW 15.9 (H) 12.3 - 15.4 %   Platelets 278 150 - 450 x10E3/uL   Neutrophils 74 Not Estab. %   Lymphs 18 Not Estab. %   Monocytes 5 Not Estab. %   Eos 3 Not Estab. %   Basos 0 Not Estab. %   Neutrophils Absolute 7.4 (H) 1.4 - 7.0 x10E3/uL   Lymphocytes Absolute  1.9 0.7 - 3.1 x10E3/uL   Monocytes Absolute 0.5 0.1 - 0.9 x10E3/uL   EOS (ABSOLUTE) 0.3 0.0 - 0.4 x10E3/uL   Basophils Absolute 0.0 0.0 - 0.2 x10E3/uL   Immature Granulocytes 0 Not Estab. %   Immature Grans (Abs) 0.0 0.0 - 0.1 x10E3/uL  Comprehensive metabolic panel     Status: Abnormal   Collection Time: 02/21/18  4:34 PM  Result Value Ref Range   Glucose 118 (H)  65 - 99 mg/dL   BUN 15 8 - 27 mg/dL   Creatinine, Ser 0.74 (L) 0.76 - 1.27 mg/dL   GFR calc non Af Amer 98 >59 mL/min/1.73   GFR calc Af Amer 113 >59 mL/min/1.73   BUN/Creatinine Ratio 20 10 - 24   Sodium 137 134 - 144 mmol/L   Potassium 4.2 3.5 - 5.2 mmol/L   Chloride 98 96 - 106 mmol/L   CO2 20 20 - 29 mmol/L   Calcium 10.1 8.6 - 10.2 mg/dL   Total Protein 7.6 6.0 - 8.5 g/dL   Albumin 4.7 3.6 - 4.8 g/dL   Globulin, Total 2.9 1.5 - 4.5 g/dL   Albumin/Globulin Ratio 1.6 1.2 - 2.2   Bilirubin Total 0.3 0.0 - 1.2 mg/dL   Alkaline Phosphatase 104 39 - 117 IU/L   AST 13 0 - 40 IU/L   ALT 13 0 - 44 IU/L  Lipid Panel w/o Chol/HDL Ratio     Status: None   Collection Time: 02/21/18  4:34 PM  Result Value Ref Range   Cholesterol, Total 141 100 - 199 mg/dL   Triglycerides 143 0 - 149 mg/dL   HDL 58 >39 mg/dL   VLDL Cholesterol Cal 29 5 - 40 mg/dL   LDL Calculated 54 0 - 99 mg/dL  Aerobic Culture (superficial specimen)     Status: None   Collection Time: 03/03/18  4:03 PM  Result Value Ref Range   Specimen Description      WOUND Performed at [facility], [address] [place], [place] [address]    Special Requests      NONE Performed at [facility], [address] [place], [place] [address]    Gram Stain NO WBC SEEN NO ORGANISMS SEEN     Culture      NO GROWTH 3 DAYS Performed at [facility], [place] [address] [place],  [address]    Report Status 03/06/2018 FINAL   CoaguChek XS/INR Waived     Status: None   Collection Time: 03/31/18  1:35 PM  Result Value Ref Range   INR 1.1 0.9 - 1.1   Prothrombin Time 13.4 sec    Comment: Differences in reagents, instruments, and pre-analytical variables can affect prothrombin time results.  These factors should be considered when comparing different prothrombin time test methods. Please Note: This test should not be used to monitor persons on heparin therapy.   CBC with Differential/Platelet      Status: Abnormal   Collection Time: 03/31/18  2:46 PM  Result Value Ref Range   WBC 9.5 3.4 - 10.8 x10E3/uL   RBC 4.61 4.14 - 5.80 x10E6/uL   Hemoglobin 11.5 (L) 13.0 - 17.7 g/dL   Hematocrit 36.0 (L) 37.5 - 51.0 %   MCV 78 (L) 79 - 97 fL   MCH 24.9 (L) 26.6 - 33.0 pg   MCHC 31.9 31.5 - 35.7 g/dL   RDW 15.3 12.3 - 15.4 %   Platelets 239 150 - 450 x10E3/uL  Neutrophils 72 Not Estab. %   Lymphs 19 Not Estab. %   Monocytes 6 Not Estab. %   Eos 3 Not Estab. %   Basos 0 Not Estab. %   Neutrophils Absolute 6.9 1.4 - 7.0 x10E3/uL   Lymphocytes Absolute 1.8 0.7 - 3.1 x10E3/uL   Monocytes Absolute 0.6 0.1 - 0.9 x10E3/uL   EOS (ABSOLUTE) 0.3 0.0 - 0.4 x10E3/uL   Basophils Absolute 0.0 0.0 - 0.2 x10E3/uL   Immature Granulocytes 0 Not Estab. %   Immature Grans (Abs) 0.0 0.0 - 0.1 x10E3/uL  Comprehensive metabolic panel     Status: Abnormal   Collection Time: 03/31/18  2:46 PM  Result Value Ref Range   Glucose 137 (H) 65 - 99 mg/dL   BUN 16 8 - 27 mg/dL   Creatinine, Ser 0.68 (L) 0.76 - 1.27 mg/dL   GFR calc non Af Amer 102 >59 mL/min/1.73   GFR calc Af Amer 117 >59 mL/min/1.73   BUN/Creatinine Ratio 24 10 - 24   Sodium 137 134 - 144 mmol/L   Potassium 4.4 3.5 - 5.2 mmol/L   Chloride 98 96 - 106 mmol/L   CO2 24 20 - 29 mmol/L   Calcium 9.4 8.6 - 10.2 mg/dL   Total Protein 7.2 6.0 - 8.5 g/dL   Albumin 4.2 3.6 - 4.8 g/dL   Globulin, Total 3.0 1.5 - 4.5 g/dL   Albumin/Globulin Ratio 1.4 1.2 - 2.2   Bilirubin Total 0.4 0.0 - 1.2 mg/dL   Alkaline Phosphatase 88 39 - 117 IU/L   AST 14 0 - 40 IU/L   ALT 16 0 - 44 IU/L  CBC     Status: Abnormal   Collection Time: 04/06/18  2:37 PM  Result Value Ref Range   WBC 8.4 3.4 - 10.8 x10E3/uL   RBC 4.53 4.14 - 5.80 x10E6/uL   Hemoglobin 10.8 (L) 13.0 - 17.7 g/dL   Hematocrit 34.8 (L) 37.5 - 51.0 %   MCV 77 (L) 79 - 97 fL   MCH 23.8 (L) 26.6 - 33.0 pg   MCHC 31.0 (L) 31.5 - 35.7 g/dL   RDW 14.4 12.3 - 15.4 %   Platelets 259 150 - 450  x10E3/uL  Comprehensive metabolic panel     Status: Abnormal   Collection Time: 04/06/18  2:37 PM  Result Value Ref Range   Glucose 125 (H) 65 - 99 mg/dL   BUN 11 8 - 27 mg/dL   Creatinine, Ser 0.71 (L) 0.76 - 1.27 mg/dL   GFR calc non Af Amer 100 >59 mL/min/1.73   GFR calc Af Amer 115 >59 mL/min/1.73   BUN/Creatinine Ratio 15 10 - 24   Sodium 141 134 - 144 mmol/L   Potassium 4.3 3.5 - 5.2 mmol/L   Chloride 101 96 - 106 mmol/L   CO2 22 20 - 29 mmol/L   Calcium 9.5 8.6 - 10.2 mg/dL   Total Protein 7.2 6.0 - 8.5 g/dL   Albumin 4.3 3.6 - 4.8 g/dL   Globulin, Total 2.9 1.5 - 4.5 g/dL   Albumin/Globulin Ratio 1.5 1.2 - 2.2   Bilirubin Total 0.4 0.0 - 1.2 mg/dL   Alkaline Phosphatase 80 39 - 117 IU/L   AST 28 0 - 40 IU/L   ALT 19 0 - 44 IU/L  TSH     Status: None   Collection Time: 04/06/18  2:37 PM  Result Value Ref Range   TSH 2.480 0.450 - 4.500 uIU/mL  POC Hemoccult Bld/Stl (1-Cd Office  Dx)     Status: Abnormal   Collection Time: 04/07/18  4:11 PM  Result Value Ref Range   Card #1 Date     Fecal Occult Blood, POC Positive (A) Negative  CBC with Differential     Status: Abnormal   Collection Time: 04/07/18  4:13 PM  Result Value Ref Range   WBC 11.1 (H) 3.4 - 10.8 x10E3/uL   RBC 4.59 4.14 - 5.80 x10E6/uL   Hemoglobin 11.3 (L) 13.0 - 17.7 g/dL   Hematocrit 34.9 (L) 37.5 - 51.0 %   MCV 76 (L) 79 - 97 fL   MCH 24.6 (L) 26.6 - 33.0 pg   MCHC 32.4 31.5 - 35.7 g/dL   RDW 14.3 12.3 - 15.4 %   Platelets 241 150 - 450 x10E3/uL   Neutrophils 77 Not Estab. %   Lymphs 13 Not Estab. %   Monocytes 7 Not Estab. %   Eos 2 Not Estab. %   Basos 0 Not Estab. %   Neutrophils Absolute 8.5 (H) 1.4 - 7.0 x10E3/uL   Lymphocytes Absolute 1.4 0.7 - 3.1 x10E3/uL   Monocytes Absolute 0.8 0.1 - 0.9 x10E3/uL   EOS (ABSOLUTE) 0.3 0.0 - 0.4 x10E3/uL   Basophils Absolute 0.0 0.0 - 0.2 x10E3/uL   Immature Granulocytes 1 Not Estab. %   Immature Grans (Abs) 0.1 0.0 - 0.1 x10E3/uL  Iron, TIBC and  Ferritin Panel     Status: Abnormal   Collection Time: 04/07/18  4:13 PM  Result Value Ref Range   Total Iron Binding Capacity 500 (H) 250 - 450 ug/dL   UIBC 362 (H) 111 - 343 ug/dL   Iron 138 38 - 169 ug/dL   Iron Saturation 28 15 - 55 %   Ferritin 13 (L) 30 - 400 ng/mL  Celiac Disease Panel     Status: None   Collection Time: 04/13/18  1:29 PM  Result Value Ref Range   Endomysial IgA Negative Negative   Transglutaminase IgA <2 0 - 3 U/mL    Comment:                               Negative        0 -  3                               Weak Positive   4 - 10                               Positive           >10  Tissue Transglutaminase (tTG) has been identified  as the endomysial antigen.  Studies have demonstr-  ated that endomysial IgA antibodies have over 99%  specificity for gluten sensitive enteropathy.    IgA/Immunoglobulin A, Serum 319 61 - 437 mg/dL  Urinalysis, Routine w reflex microscopic     Status: Abnormal   Collection Time: 04/13/18  1:29 PM  Result Value Ref Range   Specific Gravity, UA      >=1.030 (A) 1.005 - 1.030   pH, UA 7.0 5.0 - 7.5   Color, UA Yellow Yellow   Appearance Ur Clear Clear   Leukocytes, UA Negative Negative   Protein, UA Negative Negative/Trace   Glucose, UA Trace (A) Negative   Ketones, UA Negative Negative  RBC, UA Negative Negative   Bilirubin, UA Negative Negative   Urobilinogen, Ur 1.0 0.2 - 1.0 mg/dL   Nitrite, UA Negative Negative   Microscopic Examination Comment     Comment: Microscopic not indicated and not performed.  B12 and Folate Panel     Status: None   Collection Time: 04/13/18  1:29 PM  Result Value Ref Range   Vitamin B-12 273 232 - 1,245 pg/mL   Folate 16.3 >3.0 ng/mL    Comment: A serum folate concentration of less than 3.1 ng/mL is considered to represent clinical deficiency.   Bayer DCA Hb A1c Waived     Status: Abnormal   Collection Time: 05/06/18  4:17 PM  Result Value Ref Range   HB A1C (BAYER DCA - WAIVED) 7.2  (H) <7.0 %    Comment:                                       Diabetic Adult            <7.0                                       Healthy Adult        4.3 - 5.7                                                           (DCCT/NGSP) American Diabetes Association's Summary of Glycemic Recommendations for Adults with Diabetes: Hemoglobin A1c <7.0%. More stringent glycemic goals (A1c <6.0%) may further reduce complications at the cost of increased risk of hypoglycemia.   CBC with Differential/Platelet     Status: Abnormal   Collection Time: 05/06/18  4:24 PM  Result Value Ref Range   WBC 10.4 3.4 - 10.8 x10E3/uL   RBC 4.48 4.14 - 5.80 x10E6/uL   Hemoglobin 11.0 (L) 13.0 - 17.7 g/dL   Hematocrit 34.2 (L) 37.5 - 51.0 %   MCV 76 (L) 79 - 97 fL   MCH 24.6 (L) 26.6 - 33.0 pg   MCHC 32.2 31.5 - 35.7 g/dL   RDW 14.0 12.3 - 15.4 %   Platelets 309 150 - 450 x10E3/uL   Neutrophils 71 Not Estab. %   Lymphs 17 Not Estab. %   Monocytes 8 Not Estab. %   Eos 3 Not Estab. %   Basos 0 Not Estab. %   Neutrophils Absolute 7.4 (H) 1.4 - 7.0 x10E3/uL   Lymphocytes Absolute 1.8 0.7 - 3.1 x10E3/uL   Monocytes Absolute 0.8 0.1 - 0.9 x10E3/uL   EOS (ABSOLUTE) 0.3 0.0 - 0.4 x10E3/uL   Basophils Absolute 0.0 0.0 - 0.2 x10E3/uL   Immature Granulocytes 1 Not Estab. %   Immature Grans (Abs) 0.1 0.0 - 0.1 x10E3/uL  Iron and TIBC     Status: Abnormal   Collection Time: 05/06/18  4:24 PM  Result Value Ref Range   Total Iron Binding Capacity 389 250 - 450 ug/dL   UIBC 372 (H) 111 - 343 ug/dL   Iron 17 (L) 38 - 169 ug/dL   Iron Saturation 4 (LL) 15 - 55 %  Ferritin  Status: Abnormal   Collection Time: 05/06/18  4:24 PM  Result Value Ref Range   Ferritin 19 (L) 30 - 400 ng/mL  Comprehensive metabolic panel     Status: Abnormal   Collection Time: 05/06/18  4:24 PM  Result Value Ref Range   Glucose 113 (H) 65 - 99 mg/dL   BUN 11 8 - 27 mg/dL   Creatinine, Ser 0.67 (L) 0.76 - 1.27 mg/dL   GFR calc non Af  Amer 102 >59 mL/min/1.73   GFR calc Af Amer 118 >59 mL/min/1.73   BUN/Creatinine Ratio 16 10 - 24   Sodium 139 134 - 144 mmol/L   Potassium 4.4 3.5 - 5.2 mmol/L   Chloride 97 96 - 106 mmol/L   CO2 22 20 - 29 mmol/L   Calcium 9.7 8.6 - 10.2 mg/dL   Total Protein 7.1 6.0 - 8.5 g/dL   Albumin 4.2 3.6 - 4.8 g/dL   Globulin, Total 2.9 1.5 - 4.5 g/dL   Albumin/Globulin Ratio 1.4 1.2 - 2.2   Bilirubin Total 0.2 0.0 - 1.2 mg/dL   Alkaline Phosphatase 82 39 - 117 IU/L   AST 15 0 - 40 IU/L   ALT 13 0 - 44 IU/L  Glucose, capillary     Status: Abnormal   Collection Time: 05/12/18  9:16 AM  Result Value Ref Range   Glucose-Capillary 130 (H) 70 - 99 mg/dL    Radiology Ct Angio Chest Aorta W &/or Wo Contrast  Result Date: 04/18/2018 CLINICAL DATA:  Evaluate aortic dilatation EXAM: CT ANGIOGRAPHY CHEST WITH CONTRAST TECHNIQUE: Multidetector CT imaging of the chest was performed using the standard protocol during bolus administration of intravenous contrast. Multiplanar CT image reconstructions and MIPs were obtained to evaluate the vascular anatomy. CONTRAST:  72mL ISOVUE-370 IOPAMIDOL (ISOVUE-370) INJECTION 76% COMPARISON:  None. FINDINGS: Cardiovascular: Thoracic aorta demonstrates a normal branching pattern. Mild atherosclerotic changes are identified. The ascending aorta measures 3.6 cm in greatest dimension. At the sinus of Valsalva the aorta measures 4.1 cm. The sino-tubular junction measures 3.1 cm. No dissection is identified. Mild pericardial calcifications are seen. No pericardial effusion is noted. The pulmonary artery as visualized is within normal limits. Mediastinum/Nodes: Thoracic inlet is unremarkable. No significant hilar or mediastinal adenopathy is seen. The esophagus is within normal limits. Lungs/Pleura: Mild emphysematous changes are identified. Few scattered small less than 5 mm nodules are identified bilaterally. Mild scarring is noted in the left apex. Some associated nodularity  measuring 9 mm with mild spiculation is noted adjacent to the area of scarring. No other significant nodules are noted. No focal infiltrate or effusion is seen. Upper Abdomen: Cholelithiasis is noted. The remainder of the upper abdomen is within normal limits. Musculoskeletal: Degenerative changes of the thoracic spine are noted. Review of the MIP images confirms the above findings. IMPRESSION: Scattered nodules are identified throughout both lungs. The majority of these measure less than 5 mm although a more dominant 9 mm mildly spiculated nodule is noted in the left upper lobe best seen on image number 39 of series 6. This may simply be related to scarring although Non-contrast chest CT at 3-6 months is recommended. If the nodules are stable at time of repeat CT, then future CT at 18-24 months (from today's scan) is considered optional for low-risk patients, but is recommended for high-risk patients. This recommendation follows the consensus statement: Guidelines for Management of Incidental Pulmonary Nodules Detected on CT Images: From the Fleischner Society 2017; Radiology 2017; 284:228-243. Cholelithiasis. Pericardial calcifications without  effusion. Prominence of the aorta at the level of the sinus of Valsalva although no ascending aneurysm is seen. Follow-up can be determined on a clinical basis. Aortic Atherosclerosis (ICD10-I70.0) and Emphysema (ICD10-J43.9). Electronically Signed   By: Inez Catalina M.D.   On: 04/18/2018 09:24    Assessment/Plan Hypertension blood pressure control important in reducing the progression of atherosclerotic disease. On appropriate oral medications.   Stroke [facility]) Seems to be recovering reasonably well. Hard to discern how much of his residual symptoms are related to his PAD and how much may be related to his stroke. Overall, they are not disabling.  Diabetic peripheral neuropathy ([name]) blood glucose control important in reducing the progression of  atherosclerotic disease. Also, involved in wound healing. On appropriate medications.    Hyperlipidemia lipid control important in reducing the progression of atherosclerotic disease. Continue statin therapy  A-fib ([name]) Now on Eliquis.  From his peripheral vascular disease, he is going to need an amputation and I would be okay if he comes off Eliquis.  That being said, I think this is more important for his cardiac arrhythmias and I would definitely discuss this with his cardiologist before we remove Eliquis.  Iron deficiency anemia due to chronic blood loss This has been a significant issue.  His colonoscopy was normal.  He is going to get a capsule endoscopy.  From a peripheral vascular standpoint, he could stop the Eliquis but I do not know if that would be okay with his cardiologist.  Necrotic toes ([place]) The disease has now progressed beyond the point where digital amputations will be appropriate.  Podiatry has said the foot is not salvageable and I would agree with its appearance today.  His disease has continued to progress despite normal large vessel flow.  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  Atherosclerosis of native arteries of the extremities with gangrene [facility]) The disease has now progressed beyond the point where digital amputations will be appropriate.  Podiatry has said the foot is not salvageable and I would agree with its appearance today.  His disease has continued to progress despite normal large vessel flow.  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 At this point, I would agree that his best chance of healing would be a below-knee amputation.  I would think he would be a reasonable candidate for a prosthesis after the procedure.  I have discussed the risks and benefits the procedure.  He is to see his  cardiologist next week and we would appreciate input in regards to his cardiac risk with surgery.  His surgery will be scheduled for the upcoming weeks.  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    Leotis Pain, MD  05/13/2018 2:40 PM    This note was created with Dragon medical transcription system.  Any errors from dictation are purely unintentional

## 2018-05-15 ENCOUNTER — Encounter: Payer: Self-pay | Admitting: Gastroenterology

## 2018-05-16 ENCOUNTER — Ambulatory Visit: Payer: Medicare HMO | Admitting: Nurse Practitioner

## 2018-05-16 ENCOUNTER — Encounter (INDEPENDENT_AMBULATORY_CARE_PROVIDER_SITE_OTHER): Payer: Self-pay

## 2018-05-16 ENCOUNTER — Encounter: Payer: Self-pay | Admitting: Nurse Practitioner

## 2018-05-16 VITALS — BP 130/66 | HR 72 | Ht 68.5 in | Wt 186.5 lb

## 2018-05-16 DIAGNOSIS — I739 Peripheral vascular disease, unspecified: Secondary | ICD-10-CM | POA: Diagnosis not present

## 2018-05-16 DIAGNOSIS — I480 Paroxysmal atrial fibrillation: Secondary | ICD-10-CM | POA: Diagnosis not present

## 2018-05-16 DIAGNOSIS — Z01810 Encounter for preprocedural cardiovascular examination: Secondary | ICD-10-CM | POA: Diagnosis not present

## 2018-05-16 DIAGNOSIS — I10 Essential (primary) hypertension: Secondary | ICD-10-CM | POA: Diagnosis not present

## 2018-05-16 DIAGNOSIS — E782 Mixed hyperlipidemia: Secondary | ICD-10-CM | POA: Diagnosis not present

## 2018-05-16 NOTE — Progress Notes (Signed)
Office Visit    Patient Name: [name]. 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  Past Medical History    Past Medical History:  Diagnosis Date  . Anemia   . Diabetes mellitus with complication ([place])   . Dilated aortic root ([place])    a. 04/2018 Echo: 4.1cm. Asc Ao 3.5cm.  Marland Kitchen History of echocardiogram    a. 04/2018 Echo: Ef 60-65%, no rwma, midly to mod dil Ao root - 4.1cm. Asc Ao 3.5cm. Mild MR. Nl RV fxn. Nl PASP.  Marland Kitchen History of hernia repair   . History of stress test    a. 2016 MV ([name]): EF 58%, no ischemia.  . Hyperlipidemia   . Hypertension   . Legally blind   . PAF (paroxysmal atrial fibrillation) ([name])    a. on Eliquis as of 2018; b. CHADS2VASc => 5 (HTN, DM, stroke x 2, vascular disease)  . Peripheral vascular disease ([place])    a. followed by Dr. Lucky Cowboy; b. s/p kissing balloon stents and right external iliac stent in 10/2017; c. LE angiogrpahy 01/2018: No significant arterial occlusive disease in the lower extremities.  . Pulmonary nodules   . Stroke [facility])    a. 2016 & 2018   Past Surgical History:  Procedure Laterality Date  . APPENDECTOMY    . COLONOSCOPY WITH PROPOFOL N/A 05/12/2018   Procedure: COLONOSCOPY WITH PROPOFOL;  Surgeon: [name], MD;  Location: [facility] ENDOSCOPY;  Service: Gastroenterology;  Laterality: N/A;  . ESOPHAGOGASTRODUODENOSCOPY (EGD) WITH  PROPOFOL N/A 05/12/2018   Procedure: ESOPHAGOGASTRODUODENOSCOPY (EGD) WITH PROPOFOL;  Surgeon: [name], MD;  Location: [facility] ENDOSCOPY;  Service: Gastroenterology;  Laterality: N/A;  . HERNIA REPAIR     UMBILICAL  . LOWER EXTREMITY ANGIOGRAPHY Right 10/25/2017   Procedure: LOWER EXTREMITY ANGIOGRAPHY;  Surgeon: [name], MD;  Location: [place] CV LAB;  Service: Cardiovascular;  Laterality: Right;  . LOWER EXTREMITY ANGIOGRAPHY Right 01/13/2018   Procedure: LOWER EXTREMITY ANGIOGRAPHY;  Surgeon: [name], MD;  Location: [place] CV LAB;  Service: Cardiovascular;  Laterality: Right;  . LOWER EXTREMITY INTERVENTION  10/25/2017   Procedure: LOWER EXTREMITY INTERVENTION;  Surgeon: [name], MD;  Location: [place] CV LAB;  Service: Cardiovascular;;  . TONSILLECTOMY      Allergies  Allergies  Allergen Reactions  . Sodium Pentobarbital [Pentobarbital] Shortness Of Breath  . 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  He was last seen in clinic in September, as at that time, he was pending amputation of the right fourth and fifth toes in the setting of gangrene.  He underwent echocardiogram following that visit  which showed normal LV function with a moderately dilated aortic root.  He was cleared for surgery however, upon follow-up with vascular surgery, his condition was felt to be worse and it was felt that he would require a right BKA.  He recently presented for EGD and colonoscopy and post procedure, was noted to  have recurrent atrial fibrillation.  Our team was consulted but patient says he felt fine and so he left AMA.  He is in sinus rhythm today.  He says he cannot really tell when he is in atrial fibrillation and denies any palpitations today.  He has been compliant with his Eliquis.  His only complaint today is bilateral right greater than left foot pain with swelling of the right lower extremity.  This is been progressive over some time and it is for this that he sees vascular surgery.  He denies any chest pain or dyspnea on exertion though overall, activity has been limited.  He does report that he can perform light housework and also walk up a hill or stairs without experiencing symptoms.  He can also participate in sexual intercourse.  Maximum calculated metabolic equivalents would be 5.5.  His risk of a cardiovascular event in the setting of noncardiac surgery would be less than 1%.  We discussed his anticoagulation today and need for bridging in the setting of prior strokes and elevated CHA2DS2VASc.  He says he does not know if he would be interested in that but his wife is indicated she would like to talk with our anticoagulation clinic nurse for education regarding bridging.  Home Medications    Prior to Admission medications   Medication Sig Start Date End Date Taking? Authorizing Provider  acetaminophen (TYLENOL) 500 MG tablet Take 1,000 mg by mouth every 6 (six) hours as needed (for pain.).    [provider]  apixaban (ELIQUIS) 5 MG TABS tablet Take 1 tablet (5 mg total) by mouth 2 (two) times daily. 05/06/18   Johnson, Megan P, DO  aspirin 81 MG EC tablet Take 1 tablet (81 mg total) by mouth daily. 02/21/18   Johnson, Megan P, DO  Biotin (BIOTIN 5000) 5 MG CAPS Take 1 capsule by mouth daily.    [provider]  cholecalciferol (VITAMIN D) 1000 units tablet Take 1,000 Units by mouth daily.    [provider]  empagliflozin (JARDIANCE) 10 MG TABS tablet Take 5 mg by mouth  daily. 05/06/18   Johnson, Megan P, DO  ezetimibe-simvastatin (VYTORIN) 10-40 MG tablet Take 1 tablet by mouth daily at 6 PM. 02/14/18   Gollan, Kathlene November, MD  ferrous sulfate (FERROUSUL) 325 (65 FE) MG tablet Take 1 tablet (325 mg total) by mouth daily with breakfast. 04/01/18   Wynetta Emery, Megan P, DO  gabapentin (NEURONTIN) 400 MG capsule Take 1 capsule (400 mg total) by mouth 3 (three) times daily. Patient taking differently: Take 400 mg by mouth every 8 (eight) hours as needed (for pain).  03/31/18   Johnson, Megan P, DO  hydrochlorothiazide (MICROZIDE) 12.5 MG capsule Take 1 capsule (12.5 mg total) by mouth daily. 04/06/18 07/05/18  Rise Mu, PA-C  metFORMIN (GLUCOPHAGE) 1000 MG tablet Take 1 tablet (1,000 mg total) by mouth 2 (two) times daily with a meal. 05/06/18   Johnson, Megan P, DO  metoprolol tartrate (LOPRESSOR) 25 MG tablet Take 1 tablet (25 mg total) by mouth 2 (two) times daily. 04/11/18   Rise Mu, PA-C  Multiple Vitamin (MULTIVITAMIN WITH MINERALS) TABS tablet Take 1  tablet by mouth daily. 06/24/17   Fritzi Mandes, MD  oxyCODONE-acetaminophen (PERCOCET) 10-325 MG tablet Take 1 tablet by mouth every 6 (six) hours as needed for pain. 05/06/18 09/03/18  Park Liter P, DO  sulfamethoxazole-trimethoprim (BACTRIM DS,SEPTRA DS) 800-160 MG tablet Take 1 tablet by mouth 2 (two) times daily. 05/13/18   Johnson, Megan P, DO  vitamin C (ASCORBIC ACID) 500 MG tablet Take 500 mg by mouth daily.    [provider]    Review of Systems    He has bilateral right greater than left foot pain.  He denies chest pain, palpitations, PND, orthopnea, dizziness, syncope, or early satiety.  He does have right greater than left lower extremity swelling.  All other systems reviewed and are otherwise negative except as noted above.  Physical Exam    VS:  BP 130/66 (BP Location: Left Arm, Patient Position: Sitting, Cuff Size: Normal)   Pulse 72   Ht 5' 8.5" (1.74 m)   Wt 186 lb 8 oz (84.6 kg)   BMI  27.94 kg/m  , BMI Body mass index is 27.94 kg/m. GEN: Well nourished, well developed, in no acute distress. HEENT: normal. Neck: Supple, no JVD, carotid bruits, or masses. Cardiac: RRR, no murmurs, rubs, or gallops. No clubbing, cyanosis, 2+ right lower extremity and 1+ left lower extremity edema.  Respiratory:  Respirations regular and unlabored, clear to auscultation bilaterally. GI: Soft, nontender, nondistended, BS + x 4. MS: no deformity or atrophy. Skin: warm and dry, no rash. Neuro:  Strength and sensation are intact. Psych: Normal affect.  Accessory Clinical Findings    ECG personally reviewed by me today -regular sinus rhythm, 72, prior septal infarct, no acute changes.  Assessment & Plan    1.  Peripheral vascular disease/preoperative cardiac evaluation: As outlined above, patient is pending right BKA later this month in the setting of a gangrenous toes on the right foot.  His ambulation is limited by pain but he reports a peak metabolic equivalent of 5.5.  He does not experience chest pain or dyspnea.  Recent echocardiogram showed normal LV function.  In that setting, his risk of a cardiovascular event is less than 1% and he may proceed to surgery without further testing.  He should remain on aspirin, statin, beta-blocker therapy throughout the perioperative period.  2.  Paroxysmal atrial fibrillation: Patient says he is asymptomatic when he experiences A. fib and recently had a paroxysmal event following EGD and colonoscopy in September 26.  Patient left the hospital before he could see him at that time.  He is in sinus rhythm today and his rate is 72.  He is anticoagulated with Eliquis.  In the setting of pending surgery, he may hold Eliquis for 48 hours preoperatively but given a CHA2DS2VASc of 5 with prior strokes, he will require bridging with Lovenox.  I have arranged for him to follow-up with our anticoagulation clinic nurse to discuss and for education.  Patient says fairly  clearly, that he has been advised to have bridging in the past but he may choose to just risk it.  He shall resume Eliquis or other form of anticoagulation as soon as he can postoperatively.  Continue beta-blocker throughout perioperative period.  3.  Right greater than left lower extremity edema: In the setting of significant peripheral vascular disease.  Recent echo showed normal LV function.  Recently placed on HCTZ without significant improvement.  4.  Type 2 diabetes mellitus: Last A1c was 7.6 in November 2018.  He  is managed by primary care.  5.  Microcytic anemia: Hemoglobin 11.0 on October 4.  Recent EGD and colonoscopy showed gastritis without active bleeding.  He is scheduled to receive Feraheme at the cancer center.  6.  Essential hypertension: Stable.  7.  Hyperlipidemia: Continue statin therapy.  LDL was 54 in July 2019 with normal LFTs in August.  8.  Tobacco abuse: Patient quit in June 2019.  I encouraged him to remain off of cigarettes.  9.  Disposition: Patient will follow-up for Lovenox education next week.   Murray Hodgkins, NP 05/16/2018, 5:44 PM

## 2018-05-16 NOTE — Patient Instructions (Signed)
Medication Instructions:  - Your physician recommends that you continue on your current medications as directed. Please refer to the Current Medication list given to you today.  If you need a refill on your cardiac medications before your next appointment, please call your pharmacy.   Lab work: - none ordered  If you have labs (blood work) drawn today and your tests are completely normal, you will receive your results only by: Marland Kitchen MyChart Message (if you have MyChart) OR . A paper copy in the mail If you have any lab test that is abnormal or we need to change your treatment, we will call you to review the results.  Testing/Procedures: - none ordered  Follow-Up: At [facility], you and your health needs are our priority.  As part of our continuing mission to provide you with exceptional heart care, we have created designated Provider Care Teams.  These Care Teams include your primary Cardiologist (physician) and Advanced Practice Providers (APPs -  Physician Assistants and Nurse Practitioners) who all work together to provide you with the care you need, when you need it. . On Monday 05/23/18 with Mandi in the Coumadin Clinic for teaching regarding Lovenox bridging . 3 months with [name]  Any Other Special Instructions Will Be Listed Below (If Applicable). - N/A

## 2018-05-17 ENCOUNTER — Other Ambulatory Visit (INDEPENDENT_AMBULATORY_CARE_PROVIDER_SITE_OTHER): Payer: Self-pay | Admitting: Vascular Surgery

## 2018-05-18 ENCOUNTER — Telehealth (INDEPENDENT_AMBULATORY_CARE_PROVIDER_SITE_OTHER): Payer: Self-pay

## 2018-05-18 ENCOUNTER — Inpatient Hospital Stay: Payer: Medicare HMO

## 2018-05-18 ENCOUNTER — Encounter: Payer: Self-pay | Admitting: Family Medicine

## 2018-05-18 NOTE — Telephone Encounter (Signed)
Spoke with Mrs. Orene Desanctis, PA-C and [place] about situation. Alwyn Ren is going to call for further direction.

## 2018-05-18 NOTE — Telephone Encounter (Signed)
He really needs to have this surgery ASAP- can we let his cardiologist Murray Hodgkins, NP) and his vascular surgeon (Dr. Lucky Cowboy) know he refused the iron- they may be able to do it preop or just get him some blood while he's there.

## 2018-05-18 NOTE — Telephone Encounter (Signed)
Contacted [facility] Risk Management for advice regarding patient's refusal of treatment. Provided information to Merrie Roof, PA-C and placed copy of policy on Dr. Durenda Age desk.

## 2018-05-18 NOTE — Telephone Encounter (Signed)
Patient's wife called stating the patient is refusing to go to his appt for Iron infusion and called to clarify that this could affect his surgery on 06/02/18. I explained that per the NP Arna Medici and PA KIm this will affect his surgery, if he is anemic the day of his surgery they will not do his surgery. The patient's wife was very distressed that the patient is refusing to go to his infusion appt.

## 2018-05-19 ENCOUNTER — Telehealth: Payer: Self-pay | Admitting: Family Medicine

## 2018-05-19 NOTE — Telephone Encounter (Signed)
Copied from [place]. Topic: General - Other >> May 18, 2018  3:02 PM Yvette Rack wrote: Reason for CRM: Ebony Hail with [facility] states they received a referral however the pt has already established with [facility] in [facility] and has an appt on 03/17/2019. If earlier appt is needed [facility] would need to be contacted for sooner appt. >> May 19, 2018  8:14 AM Don Perking M wrote: Does the pt need to be seen sooner?

## 2018-05-19 NOTE — Telephone Encounter (Signed)
Called and spoke with Ebony Hail and relayed that this was for a second opinion.

## 2018-05-19 NOTE — Telephone Encounter (Signed)
[facility] [address] [place], [place] [address] Tel [phone] Fax [phone]

## 2018-05-19 NOTE — Telephone Encounter (Signed)
Yes, he 100% needs to be seen sooner. This is a 2nd opinion. If they are not able to do it in [place]- we will have to refer elsewhere. Copying clinical staff and Referral Coordinator

## 2018-05-20 ENCOUNTER — Other Ambulatory Visit: Payer: Self-pay | Admitting: Family Medicine

## 2018-05-23 ENCOUNTER — Encounter: Payer: Self-pay | Admitting: Family Medicine

## 2018-05-23 ENCOUNTER — Ambulatory Visit (INDEPENDENT_AMBULATORY_CARE_PROVIDER_SITE_OTHER): Payer: Medicare HMO

## 2018-05-23 ENCOUNTER — Ambulatory Visit (INDEPENDENT_AMBULATORY_CARE_PROVIDER_SITE_OTHER): Payer: Medicare HMO | Admitting: Family Medicine

## 2018-05-23 VITALS — BP 117/68 | HR 76 | Temp 97.9°F | Ht 68.5 in | Wt 189.0 lb

## 2018-05-23 DIAGNOSIS — I96 Gangrene, not elsewhere classified: Secondary | ICD-10-CM

## 2018-05-23 DIAGNOSIS — Z7901 Long term (current) use of anticoagulants: Secondary | ICD-10-CM

## 2018-05-23 MED ORDER — ENOXAPARIN SODIUM 120 MG/0.8ML ~~LOC~~ SOLN: 120.0000 mg | SUBCUTANEOUS | 1 refills | Status: DC

## 2018-05-23 MED ORDER — OXYCODONE-ACETAMINOPHEN 10-325 MG PO TABS: 1.0000 | ORAL_TABLET | Freq: Four times a day (QID) | ORAL | 0 refills | Status: DC | PRN

## 2018-05-23 MED ORDER — SULFAMETHOXAZOLE-TRIMETHOPRIM 800-160 MG PO TABS: 1.0000 | ORAL_TABLET | Freq: Two times a day (BID) | ORAL | 0 refills | Status: DC

## 2018-05-23 NOTE — Assessment & Plan Note (Addendum)
To have amputation in 10 days if blood count is up. Continue to follow with vascular and cardiology and hematology. Refill of bactrim and pain medicine given today. Call with any concerns. Continue to monitor.

## 2018-05-23 NOTE — Progress Notes (Signed)
This my first time meeting pt.  He presents today w/ his wife for Lovenox bridging instructions while holding Eliquis prior to BKA scheduled for 06/02/18 w/ Dr. Lucky Cowboy @ [facility].  Pt is initially confrontational, asking me repeatedly to explain to him what is going to happen.  Explained to pt my role in his care & that I am going to go over how to make sure he is protected from stroke while he is undergoing procedure.  He is seated in wheelchair, but asks for assistance in getting up.  Asked pt why he needs to get up, as we are in a small room and he has no room to walk around; he states that he cannot sit there anymore and needs to get up.  Pt is unsteady on his feet and has to lean on the wall and his wife.  Pt voices frustration and does not make eye contact w/ me while I am discussing his treatment plan; he interrupts me several times to ask what is going on.  I asked pt if he understands that he is going to have surgery next week to remove his leg below the knee and that he is here to discuss his blood thinners w/ me; that I am not directly involved in his surgery.  Pt says that no one has explained anything to him and that doctors are "intimidating".  Pt's wife is discreetly tearful and whispers to me that she feels like she is on an [place] by herself and that pt's personality has changed so much since his stroke that he is difficult to keep happy.  Advised pt that he may need a consult w/ physician prior to surgery if he has questions.  He states that he would prefer to cancel the surgery, but after some discussion, he is agreeable to proceeding.  Pt raised his voice at me several times; I told pt that I can sense he is frustrated, but his frustration aimed at me is unwarranted.  He asked to use the restroom and after spending a few minutes in there w/ his wife, he is apologetic for the rest of the visit.  Reviewed instructions w/ pt and his wife to take his last dose of Eliqiuis on 10/27 in am, start Lovenox 120  mg/ml in pm of 10/27, inject again on 10/28 in pm, 10/29 in pm, No Lovenox on 10/30 and restart Eliquis after surgery once cleared by MD.  Provided # to our office and asked him to call if he has any questions before that time or if date of procedure changes for any reason.  Pt and wife are much calmer at the end of the visit.

## 2018-05-23 NOTE — Telephone Encounter (Signed)
Seeing patient today at 4:00PM. Will discuss at that time.

## 2018-05-23 NOTE — Progress Notes (Signed)
BP 117/68   Pulse 76   Temp 97.9 F (36.6 C) (Oral)   Ht 5' 8.5" (1.74 m)   Wt 189 lb (85.7 kg)   SpO2 97%   BMI 28.32 kg/m    Subjective:    Patient ID: Glenn Gerold., male    DOB: Mar 22, 1956, 62 y.o.   MRN: [id number]  HPI: Glenn Gencarelli. is a 62 y.o. male  Chief Complaint  Patient presents with  . Follow-up    2 week f/up   Now to have below the knee amputation in 10 days. He refused to go for his iron infusion due to being scared last week. Has one on Wednesday. He is doing OK. Continues with a lot of pain. Very nervous about the surgery. To be bridged with lovenox for the surgery.  Relevant past medical, surgical, family and social history reviewed and updated as indicated. Interim medical history since our last visit reviewed. Allergies and medications reviewed and updated.  Review of Systems  Constitutional: Negative.   Respiratory: Negative.   Cardiovascular: Negative.   Musculoskeletal: Positive for arthralgias. Negative for back pain, gait problem, joint swelling, myalgias, neck pain and neck stiffness.  Skin: Positive for color change, pallor and wound. Negative for rash.  Neurological: Negative.   Psychiatric/Behavioral: Negative.     Per HPI unless specifically indicated above     Objective:    BP 117/68   Pulse 76   Temp 97.9 F (36.6 C) (Oral)   Ht 5' 8.5" (1.74 m)   Wt 189 lb (85.7 kg)   SpO2 97%   BMI 28.32 kg/m   Wt Readings from Last 3 Encounters:  05/23/18 189 lb (85.7 kg)  05/16/18 186 lb 8 oz (84.6 kg)  05/13/18 178 lb (80.7 kg)    Physical Exam  Constitutional: He is oriented to person, place, and time. He appears well-developed and well-nourished. No distress.  HENT:  Head: Normocephalic and atraumatic.  Right Ear: Hearing normal.  Left Ear: Hearing normal.  Nose: Nose normal.  Eyes: Conjunctivae and lids are normal. Right eye exhibits no discharge. Left eye exhibits no discharge. No scleral icterus.  Cardiovascular:  Normal rate, regular rhythm, normal heart sounds and intact distal pulses. Exam reveals no gallop and no friction rub.  No murmur heard. Pulmonary/Chest: Effort normal and breath sounds normal. No stridor. No respiratory distress. He has no wheezes. He has no rales. He exhibits no tenderness.  Musculoskeletal: Normal range of motion.  Neurological: He is alert and oriented to person, place, and time.  Skin: Skin is warm, dry and intact. Capillary refill takes less than 2 seconds. No rash noted. He is not diaphoretic. No erythema. There is pallor.  Foot not examined today  Psychiatric: He has a normal mood and affect. His speech is normal and behavior is normal. Judgment and thought content normal. Cognition and memory are normal.  Nursing note and vitals reviewed.   Results for orders placed or performed during the hospital encounter of 05/12/18  Glucose, capillary  Result Value Ref Range   Glucose-Capillary 130 (H) 70 - 99 mg/dL  Surgical pathology  Result Value Ref Range   SURGICAL PATHOLOGY      Surgical Pathology CASE: [id number] PATIENT: Calvert Haddix Surgical Pathology Report     SPECIMEN SUBMITTED: A. Stomach, for gastritis; cbx B. Colon polyp, ascending; cold snare C. Colon polyp, transverse; cbx D. Colon polyp, descending; cold snare E. Colon polyp, sigmoid; cold snare F. Rectum polyp; cold  snare  CLINICAL HISTORY: None provided  PRE-OPERATIVE DIAGNOSIS: Iron def. anemia  POST-OPERATIVE DIAGNOSIS: Diverticulosis, colon polyps, gastritis     DIAGNOSIS:  A.  STOMACH, FOR GASTRITIS; COLD BIOPSY: - ANTRAL MUCOSA WITH HEALING EROSIVE GASTRITIS. - OXYNTIC MUCOSA WITH MILD CHRONIC GASTRITIS. - NEGATIVE FOR H. PYLORI, DYSPLASIA, OR MALIGNANCY.  B.  COLON POLYP, ASCENDING; COLD SNARE: - TUBULAR ADENOMA. - NEGATIVE FOR HIGH-GRADE DYSPLASIA OR MALIGNANCY.  C.  COLON POLYP, TRANSVERSE; COLD BIOPSY: - TUBULAR ADENOMA. - NEGATIVE FOR HIGH-GRADE DYSPLASIA OR  MALIGNANCY.  D.  COLON POLYP, DESCENDING; COLD SNARE: - TUBULAR ADENOMA. - NEGATI VE FOR HIGH-GRADE DYSPLASIA OR MALIGNANCY.  E.  COLON POLYP, SIGMOID; COLD SNARE: - COLONIC MUCOSA WITH LYMPHOID AGGREGATE. - NEGATIVE FOR DYSPLASIA OR MALIGNANCY.  F.  RECTUM POLYP; COLD SNARE: - TUBULAR ADENOMA. - NEGATIVE FOR HIGH-GRADE DYSPLASIA OR MALIGNANCY.  GROSS DESCRIPTION: A. Labeled: Cbx stomach for gastritis Received: In formalin Tissue fragment(s): 4 Size: 0.3-0.4 cm Description: Tan fragments Entirely submitted in one cassette.  B. Labeled: Cold snare ascending colon polyp Received: In formalin Tissue fragment(s): Multiple Size: Aggregate, 1.2 x 0.3 x 0.1 cm Description: Tan fragments Entirely submitted in one cassette.  C. Labeled: Cbx transverse colon polyp Received: In formalin Tissue fragment(s): 3 Size: Less than 0.1-0.5 cm Description: Tan fragments Entirely submitted in one cassette.  D. Labeled: Cold snare descending colon polyp Received: In formalin Tissue fragment(s): Multiple Size: Aggregate, 2.0 x 1.0 x 0.1 cm Description:  Pink tissue fragments (40%) and yellow to mucoid fecal material Entirely submitted in one cassette.  E. Labeled: Cold snare sigmoid colon polyp Received: In formalin Tissue fragment(s): 2 Size: 0.3 cm Description: Tan fragments with a small amount of fecal material Entirely submitted in one cassette.  F. Labeled: Cold snare rectal polyp Received: In formalin Tissue fragment(s): Multiple Size: aggregate, 2.0 x 0.5 x 0.1cm Description: Pink tissue fragments (20%) and yellow fecal material Entirely submitted in one cassette.   Final Diagnosis performed by Moises Blood, MD.   Electronically signed 05/13/2018 2:34:59PM The electronic signature indicates that the named Attending Pathologist has evaluated the specimen  Technical component performed at [facility], [address] [place], [place] [address] Lab: [phone] Dir: Rush Farmer, MD, MMM  Professional component performed at [facility], [facility], [place], [place],  [address]  Lab: [phone] Dir: Dellia Nims. Rubinas, MD       Assessment & Plan:   Problem List Items Addressed This Visit      Other   Necrotic toes (Mariano Colon) - Primary    To have amputation in 10 days if blood count is up. Continue to follow with vascular and cardiology and hematology. Refill of bactrim and pain medicine given today. Call with any concerns. Continue to monitor.           Follow up plan: Return After surgery.Marland Kitchen

## 2018-05-23 NOTE — Patient Instructions (Signed)
Last dose of Eliquis on 10/27 in am Inject Lovenox once on 10/27 pm Inject Lovenox once on 10/28 pm Inject Lovenox once on 10/29 pm NO LOVENOX OR ELIQUIS ON 10/30 Restart Eliquis evening of procedure on 10/31 once cleared by MD

## 2018-05-24 ENCOUNTER — Ambulatory Visit: Payer: Medicare HMO | Admitting: Gastroenterology

## 2018-05-24 ENCOUNTER — Ambulatory Visit: Payer: Medicare HMO | Admitting: Family Medicine

## 2018-05-24 ENCOUNTER — Encounter

## 2018-05-25 ENCOUNTER — Inpatient Hospital Stay: Payer: Medicare HMO

## 2018-05-25 VITALS — BP 117/73 | HR 69 | Temp 97.0°F | Resp 20

## 2018-05-25 DIAGNOSIS — D509 Iron deficiency anemia, unspecified: Secondary | ICD-10-CM | POA: Diagnosis not present

## 2018-05-25 DIAGNOSIS — I70268 Atherosclerosis of native arteries of extremities with gangrene, other extremity: Secondary | ICD-10-CM | POA: Diagnosis not present

## 2018-05-25 DIAGNOSIS — I480 Paroxysmal atrial fibrillation: Secondary | ICD-10-CM | POA: Diagnosis not present

## 2018-05-25 DIAGNOSIS — R5383 Other fatigue: Secondary | ICD-10-CM | POA: Diagnosis not present

## 2018-05-25 DIAGNOSIS — Z8673 Personal history of transient ischemic attack (TIA), and cerebral infarction without residual deficits: Secondary | ICD-10-CM | POA: Diagnosis not present

## 2018-05-25 DIAGNOSIS — Z7901 Long term (current) use of anticoagulants: Secondary | ICD-10-CM | POA: Diagnosis not present

## 2018-05-25 DIAGNOSIS — Z79899 Other long term (current) drug therapy: Secondary | ICD-10-CM | POA: Diagnosis not present

## 2018-05-25 DIAGNOSIS — I70232 Atherosclerosis of native arteries of right leg with ulceration of calf: Secondary | ICD-10-CM | POA: Diagnosis not present

## 2018-05-25 DIAGNOSIS — R0602 Shortness of breath: Secondary | ICD-10-CM | POA: Diagnosis not present

## 2018-05-25 DIAGNOSIS — R531 Weakness: Secondary | ICD-10-CM | POA: Diagnosis not present

## 2018-05-25 MED ORDER — SODIUM CHLORIDE 0.9 % IV SOLN
510.0000 mg | Freq: Once | INTRAVENOUS | Status: AC
  Administered 2018-05-25: 510 mg via INTRAVENOUS
  Filled 2018-05-25: qty 17

## 2018-05-25 MED ORDER — SODIUM CHLORIDE 0.9 % IV SOLN
Freq: Once | INTRAVENOUS | Status: AC
  Administered 2018-05-25: 14:00:00 via INTRAVENOUS
  Filled 2018-05-25: qty 250

## 2018-05-26 ENCOUNTER — Ambulatory Visit: Payer: Medicare HMO | Admitting: Gastroenterology

## 2018-05-26 ENCOUNTER — Encounter
Admission: RE | Admit: 2018-05-26 | Discharge: 2018-05-26 | Disposition: A | Discharge status: Home / Self Care | Payer: Medicare HMO | Source: Ambulatory Visit | Attending: Vascular Surgery | Admitting: Vascular Surgery

## 2018-05-26 ENCOUNTER — Other Ambulatory Visit: Payer: Self-pay

## 2018-05-26 DIAGNOSIS — Z01812 Encounter for preprocedural laboratory examination: Secondary | ICD-10-CM | POA: Insufficient documentation

## 2018-05-26 HISTORY — DX: Peripheral vascular disease, unspecified: I73.9

## 2018-05-26 HISTORY — DX: Unspecified visual loss: H54.7

## 2018-05-26 HISTORY — DX: Unspecified atrial fibrillation: I48.91

## 2018-05-26 HISTORY — DX: Cardiac arrhythmia, unspecified: I49.9

## 2018-05-26 HISTORY — DX: Pain in leg, unspecified: M79.606

## 2018-05-26 LAB — TYPE AND SCREEN
ABO/RH(D): B POS
Antibody Screen: NEGATIVE

## 2018-05-26 LAB — BASIC METABOLIC PANEL
Anion gap: 9 (ref 5–15)
BUN: 16 mg/dL (ref 8–23)
CHLORIDE: 97 mmol/L — AB (ref 98–111)
CO2: 28 mmol/L (ref 22–32)
CREATININE: 0.77 mg/dL (ref 0.61–1.24)
Calcium: 9.7 mg/dL (ref 8.9–10.3)
GFR calc non Af Amer: >60 mL/min (ref >60)
Glucose, Bld: 157 mg/dL — ABNORMAL HIGH (ref 70–99)
POTASSIUM: 4.3 mmol/L (ref 3.5–5.1)
Sodium: 134 mmol/L — ABNORMAL LOW (ref 135–145)

## 2018-05-26 LAB — CBC WITH DIFFERENTIAL/PLATELET
Abs Immature Granulocytes: 0.06 10*3/uL (ref 0.00–0.07)
Basophils Absolute: 0 10*3/uL (ref 0.0–0.1)
Basophils Relative: 0 %
EOS ABS: 0.2 10*3/uL (ref 0.0–0.5)
EOS PCT: 2 %
HCT: 36.9 % — ABNORMAL LOW (ref 39.0–52.0)
HEMOGLOBIN: 11.2 g/dL — AB (ref 13.0–17.0)
Immature Granulocytes: 1 %
LYMPHS ABS: 1.1 10*3/uL (ref 0.7–4.0)
LYMPHS PCT: 12 %
MCH: 23.4 pg — ABNORMAL LOW (ref 26.0–34.0)
MCHC: 30.4 g/dL (ref 30.0–36.0)
MCV: 77 fL — ABNORMAL LOW (ref 80.0–100.0)
Monocytes Absolute: 0.6 10*3/uL (ref 0.1–1.0)
Monocytes Relative: 7 %
NEUTROS ABS: 7.2 10*3/uL (ref 1.7–7.7)
NEUTROS PCT: 78 %
NRBC: 0 % (ref 0.0–0.2)
Platelets: 263 10*3/uL (ref 150–400)
RBC: 4.79 MIL/uL (ref 4.22–5.81)
RDW: 15.2 % (ref 11.5–15.5)
WBC: 9.2 10*3/uL (ref 4.0–10.5)

## 2018-05-26 LAB — PROTIME-INR
INR: 1.32
PROTHROMBIN TIME: 16.2 s — AB (ref 11.4–15.2)

## 2018-05-26 LAB — APTT: aPTT: 74 seconds — ABNORMAL HIGH (ref 24–36)

## 2018-05-26 MED ORDER — CHLORHEXIDINE GLUCONATE CLOTH 2 % EX PADS
6.0000 | MEDICATED_PAD | Freq: Once | CUTANEOUS | Status: DC
  Filled 2018-05-26: qty 6

## 2018-05-26 NOTE — Pre-Procedure Instructions (Signed)
Cardiac clearance on chart. 

## 2018-05-26 NOTE — Patient Instructions (Signed)
Your procedure is scheduled on: 06/02/18 Thur Report to Same Day Surgery 2nd floor medical mall [facility] Entrance-take elevator on left to 2nd floor.  Check in with surgery information desk.) To find out your arrival time please call [phone] between 1PM - 3PM on 06/01/18 Wed  Remember: Instructions that are not followed completely may result in serious medical risk, up to and including death, or upon the discretion of your surgeon and anesthesiologist your surgery may need to be rescheduled.    _x___ 1. Do not eat food after midnight the night before your procedure. You may drink clear liquids up to 2 hours before you are scheduled to arrive at the hospital for your procedure.  Do not drink clear liquids within 2 hours of your scheduled arrival to the hospital.  Clear liquids include  --Water or Apple juice without pulp  --Clear carbohydrate beverage such as ClearFast or Gatorade  --Black Coffee or Clear Tea (No milk, no creamers, do not add anything to                  the coffee or Tea Type 1 and type 2 diabetics should only drink water.   ____Ensure clear carbohydrate drink on the way to the hospital for bariatric patients  ____Ensure clear carbohydrate drink 3 hours before surgery for Dr Dwyane Luo patients if physician instructed.   No gum chewing or hard candies.     __x__ 2. No Alcohol for 24 hours before or after surgery.   __x__3. No Smoking or e-cigarettes for 24 prior to surgery.  Do not use any chewable tobacco products for at least 6 hour prior to surgery   ____  4. Bring all medications with you on the day of surgery if instructed.    __x__ 5. Notify your doctor if there is any change in your medical condition     (cold, fever, infections).    x___6. On the morning of surgery brush your teeth with toothpaste and water.  You may rinse your mouth with mouth wash if you wish.  Do not swallow any toothpaste or mouthwash.   Do not wear jewelry, make-up, hairpins,  clips or nail polish.  Do not wear lotions, powders, or perfumes. You may wear deodorant.  Do not shave 48 hours prior to surgery. Men may shave face and neck.  Do not bring valuables to the hospital.    [facility] is not responsible for any belongings or valuables.               Contacts, dentures or bridgework may not be worn into surgery.  Leave your suitcase in the car. After surgery it may be brought to your room.  For patients admitted to the hospital, discharge time is determined by your                       treatment team.  _  Patients discharged the day of surgery will not be allowed to drive home.  You will need someone to drive you home and stay with you the night of your procedure.    Please read over the following fact sheets that you were given:   [facility] Preparing for Surgery and or MRSA Information   _x___ Take anti-hypertensive listed below, cardiac, seizure, asthma,     anti-reflux and psychiatric medicines. These include:  1. gabapentin (NEURONTIN) 400 MG capsule  2.metoprolol tartrate (LOPRESSOR) 25 MG tablet  3.oxyCODONE-acetaminophen (PERCOCET) 10-325 MG tablet if  needed  4.  5.  6.  ____Fleets enema or Magnesium Citrate as directed.   _x___ Use CHG Soap or sage wipes as directed on instruction sheet   ____ Use inhalers on the day of surgery and bring to hospital day of surgery  ____ Stop Metformin and Janumet 2 days prior to surgery.    ____ Take 1/2 of usual insulin dose the night before surgery and none on the morning     surgery.   _x___ Follow recommendations from Cardiologist, Pulmonologist or PCP regarding          stopping Aspirin, Coumadin, Plavix ,Eliquis, Effient, or Pradaxa, and Pletal. To stop Eliquis on Sun . X____Stop Anti-inflammatories such as Advil, Aleve, Ibuprofen, Motrin, Naproxen, Naprosyn, Goodies powders or aspirin products. OK to take Tylenol and                          Celebrex.   _x___ Stop supplements until after surgery.   But may continue Vitamin D, Vitamin B,       and multivitamin.   ____ Bring C-Pap to the hospital.

## 2018-05-27 ENCOUNTER — Telehealth (INDEPENDENT_AMBULATORY_CARE_PROVIDER_SITE_OTHER): Payer: Self-pay

## 2018-05-27 NOTE — Telephone Encounter (Signed)
Patient's wife called concerned that his surgery will be canceled due to low blood count ( anemia ), I explained that I had not been told that his surgery was canceled and if I found out differently I would let her know.

## 2018-05-30 ENCOUNTER — Telehealth (INDEPENDENT_AMBULATORY_CARE_PROVIDER_SITE_OTHER): Payer: Self-pay

## 2018-05-30 NOTE — Telephone Encounter (Signed)
The lab results from May 26, 2018 are relatively normal.  He is anemic but not severely.  I highly encouraged the patient to gain access to myChart so that she is able to access her husband's labs.  Anesthesia / the surgeon will review his labs the day of surgery and deem appropriate if surgery can proceed.  After surgery, the patient will receive a physical therapy evaluation while inpatient.  This evaluation will most likely recommend rehab upon discharge.  Rehab placement will be based on insurance.  The patient will follow-up in our office for staple removal in approximately three weeks from his discharge date.  At that time, we will forward his information on to biotech tab to start the prosthetic process if his stump is healing well.

## 2018-05-30 NOTE — Telephone Encounter (Signed)
Patient's wife called wanting to know the results of the patients blood work and his post procedure physical therapy and what will happen with that.

## 2018-05-30 NOTE — Telephone Encounter (Signed)
Spoke with the patient's wife and gave her the PA recommendation for his lab work and post surgical rehab. It was also discussed that it will be recommended that he go to rehab, but he cannot be made to go.

## 2018-06-01 MED ORDER — CEFAZOLIN SODIUM-DEXTROSE 2-4 GM/100ML-% IV SOLN
2.0000 g | INTRAVENOUS | Status: AC
  Administered 2018-06-02: 2 g via INTRAVENOUS

## 2018-06-02 ENCOUNTER — Inpatient Hospital Stay: Payer: Medicare HMO

## 2018-06-02 ENCOUNTER — Inpatient Hospital Stay
Admission: RE | Admit: 2018-06-02 | Discharge: 2018-06-06 | DRG: 239 | Disposition: A | Discharge status: Home Health | Payer: Medicare HMO | Source: Ambulatory Visit | Attending: Vascular Surgery | Admitting: Vascular Surgery

## 2018-06-02 ENCOUNTER — Encounter
Admission: RE | Disposition: A | Discharge status: Home Health | Payer: Self-pay | Source: Ambulatory Visit | Attending: Vascular Surgery

## 2018-06-02 DIAGNOSIS — I739 Peripheral vascular disease, unspecified: Secondary | ICD-10-CM | POA: Diagnosis not present

## 2018-06-02 DIAGNOSIS — I482 Chronic atrial fibrillation, unspecified: Secondary | ICD-10-CM | POA: Diagnosis present

## 2018-06-02 DIAGNOSIS — Z7984 Long term (current) use of oral hypoglycemic drugs: Secondary | ICD-10-CM

## 2018-06-02 DIAGNOSIS — G92 Toxic encephalopathy: Secondary | ICD-10-CM | POA: Diagnosis not present

## 2018-06-02 DIAGNOSIS — E785 Hyperlipidemia, unspecified: Secondary | ICD-10-CM | POA: Diagnosis present

## 2018-06-02 DIAGNOSIS — H548 Legal blindness, as defined in USA: Secondary | ICD-10-CM | POA: Diagnosis present

## 2018-06-02 DIAGNOSIS — I77810 Thoracic aortic ectasia: Secondary | ICD-10-CM | POA: Diagnosis present

## 2018-06-02 DIAGNOSIS — I10 Essential (primary) hypertension: Secondary | ICD-10-CM | POA: Diagnosis not present

## 2018-06-02 DIAGNOSIS — Z79899 Other long term (current) drug therapy: Secondary | ICD-10-CM

## 2018-06-02 DIAGNOSIS — Z8673 Personal history of transient ischemic attack (TIA), and cerebral infarction without residual deficits: Secondary | ICD-10-CM

## 2018-06-02 DIAGNOSIS — E1140 Type 2 diabetes mellitus with diabetic neuropathy, unspecified: Secondary | ICD-10-CM | POA: Diagnosis not present

## 2018-06-02 DIAGNOSIS — Z7901 Long term (current) use of anticoagulants: Secondary | ICD-10-CM | POA: Diagnosis not present

## 2018-06-02 DIAGNOSIS — E1152 Type 2 diabetes mellitus with diabetic peripheral angiopathy with gangrene: Secondary | ICD-10-CM | POA: Diagnosis not present

## 2018-06-02 DIAGNOSIS — I4891 Unspecified atrial fibrillation: Secondary | ICD-10-CM | POA: Diagnosis not present

## 2018-06-02 DIAGNOSIS — Z888 Allergy status to other drugs, medicaments and biological substances status: Secondary | ICD-10-CM | POA: Diagnosis not present

## 2018-06-02 DIAGNOSIS — I96 Gangrene, not elsewhere classified: Secondary | ICD-10-CM

## 2018-06-02 DIAGNOSIS — T4145XA Adverse effect of unspecified anesthetic, initial encounter: Secondary | ICD-10-CM | POA: Diagnosis not present

## 2018-06-02 DIAGNOSIS — E119 Type 2 diabetes mellitus without complications: Secondary | ICD-10-CM | POA: Diagnosis not present

## 2018-06-02 DIAGNOSIS — Z7982 Long term (current) use of aspirin: Secondary | ICD-10-CM

## 2018-06-02 DIAGNOSIS — S88119A Complete traumatic amputation at level between knee and ankle, unspecified lower leg, initial encounter: Secondary | ICD-10-CM | POA: Diagnosis not present

## 2018-06-02 DIAGNOSIS — I70261 Atherosclerosis of native arteries of extremities with gangrene, right leg: Secondary | ICD-10-CM | POA: Diagnosis not present

## 2018-06-02 HISTORY — PX: AMPUTATION: SHX166

## 2018-06-02 HISTORY — DX: Gangrene, not elsewhere classified: I96

## 2018-06-02 LAB — GLUCOSE, CAPILLARY
GLUCOSE-CAPILLARY: 152 mg/dL — AB (ref 70–99)
GLUCOSE-CAPILLARY: 148 mg/dL — AB (ref 70–99)
GLUCOSE-CAPILLARY: 172 mg/dL — AB (ref 70–99)

## 2018-06-02 LAB — CBC
HCT: 31.9 % — ABNORMAL LOW (ref 39.0–52.0)
Hemoglobin: 9.5 g/dL — ABNORMAL LOW (ref 13.0–17.0)
MCH: 23.3 pg — ABNORMAL LOW (ref 26.0–34.0)
MCHC: 29.8 g/dL — ABNORMAL LOW (ref 30.0–36.0)
MCV: 78.4 fL — AB (ref 80.0–100.0)
NRBC: 0 % (ref 0.0–0.2)
PLATELETS: 250 10*3/uL (ref 150–400)
RBC: 4.07 MIL/uL — AB (ref 4.22–5.81)
RDW: 17.3 % — AB (ref 11.5–15.5)
WBC: 12.3 10*3/uL — ABNORMAL HIGH (ref 4.0–10.5)

## 2018-06-02 LAB — CREATININE, SERUM
CREATININE: 0.67 mg/dL (ref 0.61–1.24)
GFR calc non Af Amer: >60 mL/min (ref >60)

## 2018-06-02 LAB — PROTIME-INR
INR: 1.1
Prothrombin Time: 14.1 seconds (ref 11.4–15.2)

## 2018-06-02 LAB — ABO/RH: ABO/RH(D): B POS

## 2018-06-02 SURGERY — AMPUTATION BELOW KNEE
Anesthesia: General | Laterality: Right

## 2018-06-02 MED ORDER — METOPROLOL TARTRATE 25 MG PO TABS
25.0000 mg | ORAL_TABLET | Freq: Two times a day (BID) | ORAL | Status: DC
  Administered 2018-06-02 – 2018-06-06 (×8): 25 mg via ORAL
  Filled 2018-06-02 (×8): qty 1

## 2018-06-02 MED ORDER — BIOTIN 5 MG PO CAPS: 1.0000 | ORAL_CAPSULE | Freq: Every day | ORAL | Status: DC

## 2018-06-02 MED ORDER — HYDRALAZINE HCL 20 MG/ML IJ SOLN: 5.0000 mg | INTRAMUSCULAR | Status: DC | PRN

## 2018-06-02 MED ORDER — METOPROLOL TARTRATE 5 MG/5ML IV SOLN: 2.0000 mg | INTRAVENOUS | Status: DC | PRN

## 2018-06-02 MED ORDER — LORAZEPAM 2 MG/ML IJ SOLN
0.5000 mg | INTRAMUSCULAR | Status: AC | PRN
  Administered 2018-06-02 (×2): 0.5 mg via INTRAVENOUS

## 2018-06-02 MED ORDER — GABAPENTIN 400 MG PO CAPS
400.0000 mg | ORAL_CAPSULE | Freq: Three times a day (TID) | ORAL | Status: DC
  Administered 2018-06-02 – 2018-06-06 (×10): 400 mg via ORAL
  Filled 2018-06-02 (×11): qty 1

## 2018-06-02 MED ORDER — GUAIFENESIN-DM 100-10 MG/5ML PO SYRP: 15.0000 mL | ORAL_SOLUTION | ORAL | Status: DC | PRN

## 2018-06-02 MED ORDER — ONDANSETRON HCL 4 MG/2ML IJ SOLN
INTRAMUSCULAR | Status: DC | PRN
  Administered 2018-06-02: 4 mg via INTRAVENOUS

## 2018-06-02 MED ORDER — ACETAMINOPHEN 650 MG RE SUPP: 325.0000 mg | RECTAL | Status: DC | PRN

## 2018-06-02 MED ORDER — FENTANYL CITRATE (PF) 100 MCG/2ML IJ SOLN
INTRAMUSCULAR | Status: AC
  Filled 2018-06-02: qty 2

## 2018-06-02 MED ORDER — SORBITOL 70 % SOLN
30.0000 mL | Freq: Every day | Status: DC | PRN
  Filled 2018-06-02: qty 30

## 2018-06-02 MED ORDER — FAMOTIDINE 20 MG PO TABS
ORAL_TABLET | ORAL | Status: AC
  Administered 2018-06-02: 20 mg via ORAL
  Filled 2018-06-02: qty 1

## 2018-06-02 MED ORDER — CEFAZOLIN SODIUM-DEXTROSE 2-4 GM/100ML-% IV SOLN
2.0000 g | Freq: Three times a day (TID) | INTRAVENOUS | Status: DC
  Administered 2018-06-03: 2 g via INTRAVENOUS
  Filled 2018-06-02 (×2): qty 100

## 2018-06-02 MED ORDER — EZETIMIBE-SIMVASTATIN 10-40 MG PO TABS
1.0000 | ORAL_TABLET | Freq: Every day | ORAL | Status: DC
  Filled 2018-06-02: qty 1

## 2018-06-02 MED ORDER — FENTANYL CITRATE (PF) 100 MCG/2ML IJ SOLN
INTRAMUSCULAR | Status: DC | PRN
  Administered 2018-06-02 (×4): 50 ug via INTRAVENOUS

## 2018-06-02 MED ORDER — MIDAZOLAM HCL 2 MG/2ML IJ SOLN
INTRAMUSCULAR | Status: AC
  Filled 2018-06-02: qty 2

## 2018-06-02 MED ORDER — SODIUM CHLORIDE 0.9 % IV SOLN
INTRAVENOUS | Status: DC
  Administered 2018-06-02 (×2): via INTRAVENOUS

## 2018-06-02 MED ORDER — ALUM & MAG HYDROXIDE-SIMETH 200-200-20 MG/5ML PO SUSP: 15.0000 mL | ORAL | Status: DC | PRN

## 2018-06-02 MED ORDER — FAMOTIDINE 20 MG PO TABS
20.0000 mg | ORAL_TABLET | Freq: Once | ORAL | Status: AC
  Administered 2018-06-02: 20 mg via ORAL

## 2018-06-02 MED ORDER — HYDROCHLOROTHIAZIDE 12.5 MG PO CAPS
12.5000 mg | ORAL_CAPSULE | Freq: Every day | ORAL | Status: DC
  Administered 2018-06-03 – 2018-06-06 (×4): 12.5 mg via ORAL
  Filled 2018-06-02 (×4): qty 1

## 2018-06-02 MED ORDER — PHENOL 1.4 % MT LIQD
1.0000 | OROMUCOSAL | Status: DC | PRN
  Filled 2018-06-02: qty 177

## 2018-06-02 MED ORDER — SODIUM CHLORIDE 0.9 % IV SOLN
INTRAVENOUS | Status: DC
  Administered 2018-06-03: 03:00:00 via INTRAVENOUS

## 2018-06-02 MED ORDER — DOCUSATE SODIUM 100 MG PO CAPS
100.0000 mg | ORAL_CAPSULE | Freq: Every day | ORAL | Status: DC
  Administered 2018-06-03 – 2018-06-06 (×4): 100 mg via ORAL
  Filled 2018-06-02 (×4): qty 1

## 2018-06-02 MED ORDER — DOCUSATE SODIUM 100 MG PO CAPS: 100.0000 mg | ORAL_CAPSULE | Freq: Two times a day (BID) | ORAL | Status: DC | PRN

## 2018-06-02 MED ORDER — OXYCODONE-ACETAMINOPHEN 5-325 MG PO TABS
2.0000 | ORAL_TABLET | Freq: Four times a day (QID) | ORAL | Status: DC | PRN
  Administered 2018-06-02 – 2018-06-06 (×11): 2 via ORAL
  Filled 2018-06-02 (×11): qty 2

## 2018-06-02 MED ORDER — MIDAZOLAM HCL 2 MG/2ML IJ SOLN
INTRAMUSCULAR | Status: DC | PRN
  Administered 2018-06-02: 2 mg via INTRAVENOUS

## 2018-06-02 MED ORDER — ADULT MULTIVITAMIN W/MINERALS CH
1.0000 | ORAL_TABLET | Freq: Every day | ORAL | Status: DC
  Administered 2018-06-03 – 2018-06-06 (×3): 1 via ORAL
  Filled 2018-06-02 (×4): qty 1

## 2018-06-02 MED ORDER — POLYETHYLENE GLYCOL 3350 17 G PO PACK: 17.0000 g | PACK | Freq: Every day | ORAL | Status: DC | PRN

## 2018-06-02 MED ORDER — LORAZEPAM 2 MG/ML IJ SOLN
INTRAMUSCULAR | Status: AC
  Administered 2018-06-02: 0.5 mg via INTRAVENOUS
  Filled 2018-06-02: qty 1

## 2018-06-02 MED ORDER — APIXABAN 5 MG PO TABS
5.0000 mg | ORAL_TABLET | Freq: Two times a day (BID) | ORAL | Status: DC
  Administered 2018-06-03 – 2018-06-06 (×7): 5 mg via ORAL
  Filled 2018-06-02 (×7): qty 1

## 2018-06-02 MED ORDER — DIPHENHYDRAMINE HCL 25 MG PO CAPS
25.0000 mg | ORAL_CAPSULE | Freq: Four times a day (QID) | ORAL | Status: DC | PRN
  Administered 2018-06-02 – 2018-06-04 (×3): 25 mg via ORAL
  Filled 2018-06-02 (×2): qty 1

## 2018-06-02 MED ORDER — ONDANSETRON HCL 4 MG/2ML IJ SOLN: 4.0000 mg | Freq: Once | INTRAMUSCULAR | Status: DC | PRN

## 2018-06-02 MED ORDER — PHENYLEPHRINE HCL 10 MG/ML IJ SOLN
INTRAMUSCULAR | Status: DC | PRN
  Administered 2018-06-02: 100 ug via INTRAVENOUS

## 2018-06-02 MED ORDER — MAGNESIUM SULFATE 2 GM/50ML IV SOLN: 2.0000 g | Freq: Every day | INTRAVENOUS | Status: DC | PRN

## 2018-06-02 MED ORDER — FENTANYL CITRATE (PF) 100 MCG/2ML IJ SOLN
25.0000 ug | INTRAMUSCULAR | Status: AC | PRN
  Administered 2018-06-02 (×6): 25 ug via INTRAVENOUS

## 2018-06-02 MED ORDER — CEFAZOLIN SODIUM-DEXTROSE 2-4 GM/100ML-% IV SOLN
INTRAVENOUS | Status: AC
  Filled 2018-06-02: qty 100

## 2018-06-02 MED ORDER — GLYCOPYRROLATE 0.2 MG/ML IJ SOLN
INTRAMUSCULAR | Status: AC
  Filled 2018-06-02: qty 1

## 2018-06-02 MED ORDER — HEPARIN SODIUM (PORCINE) 5000 UNIT/ML IJ SOLN: 5000.0000 [IU] | Freq: Three times a day (TID) | INTRAMUSCULAR | Status: DC

## 2018-06-02 MED ORDER — INSULIN ASPART 100 UNIT/ML ~~LOC~~ SOLN
0.0000 [IU] | Freq: Three times a day (TID) | SUBCUTANEOUS | Status: DC
  Administered 2018-06-03: 2 [IU] via SUBCUTANEOUS
  Administered 2018-06-03 – 2018-06-04 (×5): 3 [IU] via SUBCUTANEOUS
  Administered 2018-06-05: 2 [IU] via SUBCUTANEOUS
  Administered 2018-06-05: 3 [IU] via SUBCUTANEOUS
  Administered 2018-06-06: 2 [IU] via SUBCUTANEOUS
  Administered 2018-06-06: 3 [IU] via SUBCUTANEOUS
  Filled 2018-06-02 (×10): qty 1

## 2018-06-02 MED ORDER — MORPHINE SULFATE (PF) 2 MG/ML IV SOLN
2.0000 mg | INTRAVENOUS | Status: DC | PRN
  Administered 2018-06-02 (×2): 2 mg via INTRAVENOUS
  Administered 2018-06-03 (×2): 4 mg via INTRAVENOUS
  Administered 2018-06-03: 2 mg via INTRAVENOUS
  Administered 2018-06-03 – 2018-06-04 (×3): 5 mg via INTRAVENOUS
  Filled 2018-06-02 (×2): qty 1
  Filled 2018-06-02: qty 3
  Filled 2018-06-02 (×2): qty 2
  Filled 2018-06-02: qty 3
  Filled 2018-06-02: qty 1
  Filled 2018-06-02: qty 3

## 2018-06-02 MED ORDER — LIDOCAINE HCL (PF) 2 % IJ SOLN
INTRAMUSCULAR | Status: AC
  Filled 2018-06-02: qty 10

## 2018-06-02 MED ORDER — FENTANYL CITRATE (PF) 100 MCG/2ML IJ SOLN
INTRAMUSCULAR | Status: AC
  Administered 2018-06-02: 25 ug via INTRAVENOUS
  Filled 2018-06-02: qty 2

## 2018-06-02 MED ORDER — PHENYLEPHRINE HCL 10 MG/ML IJ SOLN
INTRAMUSCULAR | Status: AC
  Filled 2018-06-02: qty 1

## 2018-06-02 MED ORDER — SIMVASTATIN 20 MG PO TABS
40.0000 mg | ORAL_TABLET | Freq: Every day | ORAL | Status: DC
  Administered 2018-06-02 – 2018-06-05 (×4): 40 mg via ORAL
  Filled 2018-06-02 (×4): qty 2

## 2018-06-02 MED ORDER — ZIPRASIDONE MESYLATE 20 MG IM SOLR
10.0000 mg | Freq: Once | INTRAMUSCULAR | Status: DC
  Filled 2018-06-02: qty 20

## 2018-06-02 MED ORDER — FAMOTIDINE IN NACL 20-0.9 MG/50ML-% IV SOLN
20.0000 mg | Freq: Two times a day (BID) | INTRAVENOUS | Status: DC
  Administered 2018-06-02 – 2018-06-04 (×4): 20 mg via INTRAVENOUS
  Filled 2018-06-02 (×4): qty 50

## 2018-06-02 MED ORDER — ACETAMINOPHEN 500 MG PO TABS: 1000.0000 mg | ORAL_TABLET | Freq: Four times a day (QID) | ORAL | Status: DC | PRN

## 2018-06-02 MED ORDER — VITAMIN D 1000 UNITS PO TABS
1000.0000 [IU] | ORAL_TABLET | Freq: Every day | ORAL | Status: DC
  Administered 2018-06-03 – 2018-06-06 (×4): 1000 [IU] via ORAL
  Filled 2018-06-02 (×4): qty 1

## 2018-06-02 MED ORDER — ONDANSETRON HCL 4 MG/2ML IJ SOLN: 4.0000 mg | Freq: Four times a day (QID) | INTRAMUSCULAR | Status: DC | PRN

## 2018-06-02 MED ORDER — LORAZEPAM 2 MG/ML IJ SOLN
0.5000 mg | Freq: Once | INTRAMUSCULAR | Status: AC
  Administered 2018-06-02: 0.5 mg via INTRAVENOUS

## 2018-06-02 MED ORDER — ASPIRIN 81 MG PO CHEW
81.0000 mg | CHEWABLE_TABLET | Freq: Every day | ORAL | Status: DC
  Administered 2018-06-03 – 2018-06-06 (×4): 81 mg via ORAL
  Filled 2018-06-02 (×4): qty 1

## 2018-06-02 MED ORDER — METFORMIN HCL 500 MG PO TABS
1000.0000 mg | ORAL_TABLET | Freq: Two times a day (BID) | ORAL | Status: DC
  Administered 2018-06-03 – 2018-06-06 (×7): 1000 mg via ORAL
  Filled 2018-06-02 (×9): qty 2

## 2018-06-02 MED ORDER — POTASSIUM CHLORIDE CRYS ER 20 MEQ PO TBCR: 20.0000 meq | EXTENDED_RELEASE_TABLET | Freq: Every day | ORAL | Status: DC | PRN

## 2018-06-02 MED ORDER — PROPOFOL 10 MG/ML IV BOLUS
INTRAVENOUS | Status: AC
  Filled 2018-06-02: qty 20

## 2018-06-02 MED ORDER — LABETALOL HCL 5 MG/ML IV SOLN: 10.0000 mg | INTRAVENOUS | Status: DC | PRN

## 2018-06-02 MED ORDER — CANAGLIFLOZIN 100 MG PO TABS
100.0000 mg | ORAL_TABLET | Freq: Every day | ORAL | Status: DC
  Filled 2018-06-02 (×3): qty 1

## 2018-06-02 MED ORDER — LIDOCAINE HCL (CARDIAC) PF 100 MG/5ML IV SOSY
PREFILLED_SYRINGE | INTRAVENOUS | Status: DC | PRN
  Administered 2018-06-02: 100 mg via INTRAVENOUS

## 2018-06-02 MED ORDER — LORAZEPAM 2 MG/ML IJ SOLN
INTRAMUSCULAR | Status: AC
  Filled 2018-06-02: qty 1

## 2018-06-02 MED ORDER — PROPOFOL 10 MG/ML IV BOLUS
INTRAVENOUS | Status: DC | PRN
  Administered 2018-06-02: 120 mg via INTRAVENOUS

## 2018-06-02 MED ORDER — EZETIMIBE 10 MG PO TABS
10.0000 mg | ORAL_TABLET | Freq: Every day | ORAL | Status: DC
  Administered 2018-06-03 – 2018-06-06 (×4): 10 mg via ORAL
  Filled 2018-06-02 (×5): qty 1

## 2018-06-02 MED ORDER — DIPHENHYDRAMINE HCL 25 MG PO CAPS
ORAL_CAPSULE | ORAL | Status: AC
  Filled 2018-06-02: qty 1

## 2018-06-02 MED ORDER — GLYCOPYRROLATE 0.2 MG/ML IJ SOLN
INTRAMUSCULAR | Status: DC | PRN
  Administered 2018-06-02: 0.2 mg via INTRAVENOUS

## 2018-06-02 MED ORDER — ACETAMINOPHEN 325 MG PO TABS
325.0000 mg | ORAL_TABLET | ORAL | Status: DC | PRN
  Administered 2018-06-05 – 2018-06-06 (×2): 650 mg via ORAL
  Filled 2018-06-02 (×2): qty 2

## 2018-06-02 MED ORDER — ONDANSETRON HCL 4 MG/2ML IJ SOLN
INTRAMUSCULAR | Status: AC
  Filled 2018-06-02: qty 2

## 2018-06-02 MED ORDER — FERROUS SULFATE 325 (65 FE) MG PO TABS
325.0000 mg | ORAL_TABLET | Freq: Every day | ORAL | Status: DC
  Administered 2018-06-03 – 2018-06-06 (×4): 325 mg via ORAL
  Filled 2018-06-02 (×4): qty 1

## 2018-06-02 SURGICAL SUPPLY — 38 items
BANDAGE ELASTIC 6 LF NS (GAUZE/BANDAGES/DRESSINGS) ×2 IMPLANT
BLADE SAGITTAL WIDE XTHICK NO (BLADE) ×2 IMPLANT
BNDG COHESIVE 4X5 TAN STRL (GAUZE/BANDAGES/DRESSINGS) ×2 IMPLANT
BNDG GAUZE 4.5X4.1 6PLY STRL (MISCELLANEOUS) ×4 IMPLANT
BRUSH SCRUB EZ  4% CHG (MISCELLANEOUS) ×1
BRUSH SCRUB EZ 4% CHG (MISCELLANEOUS) ×1 IMPLANT
CANISTER SUCT 1200ML W/VALVE (MISCELLANEOUS) ×2 IMPLANT
COVER WAND RF STERILE (DRAPES) ×2 IMPLANT
DRAIN PENROSE 1/4X12 LTX (DRAIN) ×2 IMPLANT
DRAPE STERI IOBAN 125X83 (DRAPES) IMPLANT
DURAPREP 26ML APPLICATOR (WOUND CARE) ×2 IMPLANT
ELECT CAUTERY BLADE 6.4 (BLADE) ×2 IMPLANT
ELECT REM PT RETURN 9FT ADLT (ELECTROSURGICAL) ×2
ELECTRODE REM PT RTRN 9FT ADLT (ELECTROSURGICAL) ×1 IMPLANT
GAUZE PETRO XEROFOAM 1X8 (MISCELLANEOUS) ×4 IMPLANT
GLOVE BIO SURGEON STRL SZ7 (GLOVE) ×4 IMPLANT
GLOVE INDICATOR 7.5 STRL GRN (GLOVE) ×2 IMPLANT
GOWN STRL REUS W/ TWL LRG LVL3 (GOWN DISPOSABLE) ×2 IMPLANT
GOWN STRL REUS W/ TWL XL LVL3 (GOWN DISPOSABLE) ×1 IMPLANT
GOWN STRL REUS W/TWL LRG LVL3 (GOWN DISPOSABLE) ×2
GOWN STRL REUS W/TWL XL LVL3 (GOWN DISPOSABLE) ×1
HANDLE YANKAUER SUCT BULB TIP (MISCELLANEOUS) ×2 IMPLANT
KIT TURNOVER KIT A (KITS) ×2 IMPLANT
LABEL OR SOLS (LABEL) ×2 IMPLANT
NS IRRIG 1000ML POUR BTL (IV SOLUTION) ×2 IMPLANT
PACK EXTREMITY ARMC (MISCELLANEOUS) ×2 IMPLANT
PAD ABD DERMACEA PRESS 5X9 (GAUZE/BANDAGES/DRESSINGS) ×4 IMPLANT
PAD PREP 24X41 OB/GYN DISP (PERSONAL CARE ITEMS) ×2 IMPLANT
SPONGE LAP 18X18 RF (DISPOSABLE) ×6 IMPLANT
STAPLER SKIN PROX 35W (STAPLE) ×2 IMPLANT
STOCKINETTE M/LG 89821 (MISCELLANEOUS) ×2 IMPLANT
SUT SILK 2 0 (SUTURE) ×1
SUT SILK 2 0 SH (SUTURE) ×6 IMPLANT
SUT SILK 2-0 18XBRD TIE 12 (SUTURE) ×1 IMPLANT
SUT SILK 3 0 (SUTURE) ×1
SUT SILK 3-0 18XBRD TIE 12 (SUTURE) ×1 IMPLANT
SUT VIC AB 0 CT1 36 (SUTURE) ×4 IMPLANT
SUT VIC AB 2-0 CT1 (SUTURE) ×4 IMPLANT

## 2018-06-02 NOTE — Anesthesia Procedure Notes (Signed)
Procedure Name: LMA Insertion Date/Time: 06/02/2018 1:33 PM Performed by: Aline Brochure, CRNA Pre-anesthesia Checklist: Patient identified, Emergency Drugs available, Suction available and Patient being monitored Patient Re-evaluated:Patient Re-evaluated prior to induction Oxygen Delivery Method: Circle system utilized Preoxygenation: Pre-oxygenation with 100% oxygen Induction Type: IV induction Ventilation: Mask ventilation without difficulty LMA: LMA inserted LMA Size: 4.5 Number of attempts: 1 Placement Confirmation: ETT inserted through vocal cords under direct vision,  positive ETCO2 and breath sounds checked- equal and bilateral Tube secured with: Tape Dental Injury: Teeth and Oropharynx as per pre-operative assessment

## 2018-06-02 NOTE — Anesthesia Post-op Follow-up Note (Signed)
Anesthesia QCDR form completed.        

## 2018-06-02 NOTE — Anesthesia Preprocedure Evaluation (Addendum)
Anesthesia Evaluation  Patient identified by MRN, date of birth, ID band Patient awake    Reviewed: Allergy & Precautions, NPO status , Patient's Chart, lab work & pertinent test results, reviewed documented beta blocker date and time   Airway Mallampati: III  TM Distance: >3 FB     Dental  (+) Chipped, Upper Dentures, Partial Lower   Pulmonary former smoker,           Cardiovascular hypertension, Pt. on medications and Pt. on home beta blockers + Peripheral Vascular Disease  + dysrhythmias      Neuro/Psych  Headaches,  Neuromuscular disease CVA, Residual Symptoms    GI/Hepatic   Endo/Other  diabetes, Type 2  Renal/GU Renal disease     Musculoskeletal   Abdominal   Peds  Hematology  (+) anemia ,   Anesthesia Other Findings Smokes. EF 60-65.  Reproductive/Obstetrics                            Anesthesia Physical Anesthesia Plan  ASA: III  Anesthesia Plan: General   Post-op Pain Management:    Induction: Intravenous  PONV Risk Score and Plan:   Airway Management Planned: LMA  Additional Equipment:   Intra-op Plan:   Post-operative Plan:   Informed Consent: I have reviewed the patients History and Physical, chart, labs and discussed the procedure including the risks, benefits and alternatives for the proposed anesthesia with the patient or authorized representative who has indicated his/her understanding and acceptance.     Plan Discussed with: CRNA  Anesthesia Plan Comments:         Anesthesia Quick Evaluation

## 2018-06-02 NOTE — Op Note (Signed)
   OPERATIVE NOTE   PROCEDURE: Right below-the-knee amputation  PRE-OPERATIVE DIAGNOSIS: Right foot gangrene  POST-OPERATIVE DIAGNOSIS: same as above  SURGEON: Leotis Pain, MD  ASSISTANT(S): none  ANESTHESIA: general  ESTIMATED BLOOD LOSS: 350 cc  FINDING(S): none  SPECIMEN(S):  Right below-the-knee amputation  INDICATIONS:   [name]. is a 62 y.o. male who presents with right leg gangrene.  The patient is scheduled for a right below-the-knee amputation.  I discussed in depth with the patient the risks, benefits, and alternatives to this procedure.  The patient is aware that the risk of this operation included but are not limited to:  bleeding, infection, myocardial infarction, stroke, death, failure to heal amputation wound, and possible need for more proximal amputation.  The patient is aware of the risks and agrees proceed forward with the procedure.  DESCRIPTION:  After full informed written consent was obtained from the patient, the patient was brought back to the operating room, and placed supine upon the operating table.  Prior to induction, the patient received IV antibiotics.  The patient was then prepped and draped in the standard fashion for a below-the-knee amputation.  After obtaining adequate anesthesia, the patient was prepped and draped in the standard fashion for a right below-the-knee amputation.  I marked out the anterior incision two finger breadths below the tibial tuberosity and then the marked out a posterior flap that was one third of the circumference of the calf in length.   I made the incisions for these flaps, and then dissected through the subcutaneous tissue, fascia, and muscle anteriorly.  I elevated  the periosteal tissue superiorly so that the tibia was about 3-4 cm shorter than the anterior skin flap.  I then transected the tibia with a power saw and then took a wedge off the tibia anteriorly with the power saw.  Then I smoothed out the rough  edges.  In a similar fashion, I cut back the fibula about two centimeters higher than the level of the tibia with a bone cutter.  I put a bone hook into the distal tibia and then used a large amputation knife to sharply develop a tissue plane through the muscle along the fibula.  In such fashion, the posterior flap was developed.  At this point, the specimen was passed off the field as the below-the-knee amputation.  At this point, I clamped all visibly bleeding arteries and veins using a combination of suture ligation with Silk suture and electrocautery.  Bleeding continued to be controlled with electrocautery and suture ligature.  The stump was washed off with sterile normal saline and no further active bleeding was noted.  I reapproximated the anterior and posterior fascia  with interrupted stitches of 0 Vicryl.  This was completed along the entire length of anterior and posterior fascia until there were no more loose space in the fascial line. I then placed a layer of 2-0 Vicryl sutures in the subcutaneous tissue. The skin was then  reapproximated with staples.  The stump was washed off and dried.  The incision was dressed with Xeroform and  then fluffs were applied.  Kerlix was wrapped around the leg and then gently an ACE wrap was applied.    COMPLICATIONS: none  CONDITION: stable   Leotis Pain  06/02/2018, 3:36 PM    This note was created with Dragon Medical transcription system. Any errors in dictation are purely unintentional.

## 2018-06-02 NOTE — Transfer of Care (Signed)
Immediate Anesthesia Transfer of Care Note  Patient: [name].  Procedure(s) Performed: AMPUTATION BELOW KNEE (Right )  Patient Location: PACU  Anesthesia Type:General  Level of Consciousness: awake  Airway & Oxygen Therapy: Patient Spontanous Breathing  Post-op Assessment: Post -op Vital signs reviewed and stable  Post vital signs: stable  Last Vitals:  Vitals Value Taken Time  BP    Temp    Pulse    Resp    SpO2      Last Pain:  Vitals:   06/02/18 1202  TempSrc: Tympanic  PainSc: 5          Complications: No apparent anesthesia complications

## 2018-06-02 NOTE — Consult Note (Signed)
[name]    MR#:  [id number]  DATE OF BIRTH:  [birth year]  DATE OF ADMISSION:  06/02/2018  PRIMARY CARE PHYSICIAN: [name], DO   REQUESTING/REFERRING PHYSICIAN: Dew  CHIEF COMPLAINT:  No chief complaint on file.   HISTORY OF PRESENT ILLNESS: [name]  is a 62 y.o. male with a known history of atrial fibrillation, diabetes with complication, hyperlipidemia, hypertension, peripheral arterial disease, pulmonary nodule, stroke-was brought in by vascular surgery for right lower limb below-knee amputation due to circulation issues. medical consult was called in after the surgery for postoperative and in-hospital medical management. Patient is confused after surgery and cannot give review of system details obtained from his wife in the room.  PAST MEDICAL HISTORY:   Past Medical History:  Diagnosis Date  . Anemia   . Atrial fibrillation ([place])   . Blind    right eye  . Diabetes mellitus with complication ([place])   . Dilated aortic root ([place])    a. 04/2018 Echo: 4.1cm. Asc Ao 3.5cm.  Marland Kitchen Dysrhythmia   . History of echocardiogram    a. 04/2018 Echo: Ef 60-65%, no rwma, midly to mod dil Ao root - 4.1cm. Asc Ao 3.5cm. Mild MR. Nl RV fxn. Nl PASP.  Marland Kitchen History of hernia repair   . History of stress test    a. 2016 MV ([name]): EF 58%, no ischemia.  . Hyperlipidemia   . Hypertension   . Leg pain   . Legally blind   . PAD (peripheral artery disease) ([place])   . PAF (paroxysmal atrial fibrillation) ([name])    a. on Eliquis as of 2018; b. CHADS2VASc => 5 (HTN, DM, stroke x 2, vascular disease)  . Peripheral vascular disease ([place])    a. followed by Dr. Lucky Cowboy; b. s/p kissing balloon stents and right external iliac stent in 10/2017; c. LE angiogrpahy 01/2018: No significant arterial occlusive disease in the lower extremities.  . Pulmonary nodules   . Stroke [facility])    a. 2016 & 2018    PAST SURGICAL HISTORY:  Past  Surgical History:  Procedure Laterality Date  . APPENDECTOMY    . COLONOSCOPY WITH PROPOFOL N/A 05/12/2018   Procedure: COLONOSCOPY WITH PROPOFOL;  Surgeon: [name], MD;  Location: [facility] ENDOSCOPY;  Service: Gastroenterology;  Laterality: N/A;  . ESOPHAGOGASTRODUODENOSCOPY (EGD) WITH PROPOFOL N/A 05/12/2018   Procedure: ESOPHAGOGASTRODUODENOSCOPY (EGD) WITH PROPOFOL;  Surgeon: [name], MD;  Location: [facility] ENDOSCOPY;  Service: Gastroenterology;  Laterality: N/A;  . HERNIA REPAIR     UMBILICAL  . LOWER EXTREMITY ANGIOGRAPHY Right 10/25/2017   Procedure: LOWER EXTREMITY ANGIOGRAPHY;  Surgeon: [name], MD;  Location: [place] CV LAB;  Service: Cardiovascular;  Laterality: Right;  . LOWER EXTREMITY ANGIOGRAPHY Right 01/13/2018   Procedure: LOWER EXTREMITY ANGIOGRAPHY;  Surgeon: [name], MD;  Location: [place] CV LAB;  Service: Cardiovascular;  Laterality: Right;  . LOWER EXTREMITY INTERVENTION  10/25/2017   Procedure: LOWER EXTREMITY INTERVENTION;  Surgeon: [name], MD;  Location: [place] CV LAB;  Service: Cardiovascular;;  . TONSILLECTOMY      SOCIAL HISTORY:  Social History   Tobacco Use  . Smoking status: Former Smoker    Packs/day: 1.00    Years: 30.00    Pack years: 30.00    Types: Cigarettes    Last attempt to quit: 01/09/2018    Years since quitting: 0.3  . Smokeless tobacco: Never Used  Substance Use Topics  . Alcohol use: Yes    Alcohol/week: 8.0 standard drinks    Types: 8 Cans of beer per week    Comment: occasionally beer     FAMILY HISTORY:  Family History  Problem Relation Age of Onset  . Diabetes Father   . Hypertension Father   . Hyperlipidemia Father     DRUG ALLERGIES:  Allergies  Allergen Reactions  . Sodium Pentobarbital [Pentobarbital] Shortness Of Breath  . Lipitor [Atorvastatin] Rash    REVIEW OF SYSTEMS:   Patient is confused MEDICATIONS AT HOME:  Prior to Admission medications   Medication Sig Start Date  End Date Taking? Authorizing Provider  acetaminophen (TYLENOL) 500 MG tablet Take 1,000 mg by mouth every 6 (six) hours as needed (for pain.).   Yes [provider]  aspirin 81 MG EC tablet Take 1 tablet (81 mg total) by mouth daily. 02/21/18  Yes Johnson, Megan P, DO  Biotin (BIOTIN 5000) 5 MG CAPS Take 1 capsule by mouth daily.   Yes [provider]  cholecalciferol (VITAMIN D) 1000 units tablet Take 1,000 Units by mouth daily.   Yes [provider]  docusate sodium (COLACE) 100 MG capsule Take 100 mg by mouth 2 (two) times daily as needed for mild constipation.   Yes [provider]  empagliflozin (JARDIANCE) 10 MG TABS tablet Take 5 mg by mouth daily. 05/06/18  Yes Johnson, Megan P, DO  ezetimibe-simvastatin (VYTORIN) 10-40 MG tablet Take 1 tablet by mouth daily at 6 PM. 02/14/18  Yes Gollan, Kathlene November, MD  hydrochlorothiazide (MICROZIDE) 12.5 MG capsule Take 1 capsule (12.5 mg total) by mouth daily. 04/06/18 07/05/18 Yes Dunn, Areta Haber, PA-C  metFORMIN (GLUCOPHAGE) 1000 MG tablet Take 1 tablet (1,000 mg total) by mouth 2 (two) times daily with a meal. 05/06/18  Yes Johnson, Megan P, DO  metoprolol tartrate (LOPRESSOR) 25 MG tablet Take 1 tablet (25 mg total) by mouth 2 (two) times daily. 04/11/18  Yes Dunn, Areta Haber, PA-C  oxyCODONE-acetaminophen (PERCOCET) 10-325 MG tablet Take 1 tablet by mouth every 6 (six) hours as needed for pain. 05/23/18 09/20/18 Yes Johnson, Megan P, DO  sulfamethoxazole-trimethoprim (BACTRIM DS,SEPTRA DS) 800-160 MG tablet Take 1 tablet by mouth 2 (two) times daily. 05/23/18  Yes Johnson, Megan P, DO  apixaban (ELIQUIS) 5 MG TABS tablet Take 1 tablet (5 mg total) by mouth 2 (two) times daily. 05/06/18   Johnson, Megan P, DO  enoxaparin (LOVENOX) 120 MG/0.8ML injection Inject 0.8 mLs (120 mg total) into the skin daily. 05/23/18   Theora Gianotti, NP  ferrous sulfate (FERROUSUL) 325 (65 FE) MG tablet Take 1 tablet (325 mg total) by mouth  daily with breakfast. 04/01/18   Wynetta Emery, Megan P, DO  gabapentin (NEURONTIN) 400 MG capsule TAKE 1 CAPSULE (400 MG TOTAL) BY MOUTH 3 (THREE) TIMES DAILY. 05/20/18   Park Liter P, DO  Multiple Vitamin (MULTIVITAMIN WITH MINERALS) TABS tablet Take 1 tablet by mouth daily. 06/24/17   Fritzi Mandes, MD  vitamin C (ASCORBIC ACID) 500 MG tablet Take 500 mg by mouth daily.    [provider]      PHYSICAL EXAMINATION:   VITAL SIGNS: Blood pressure 114/72, pulse 71, temperature 97.8 F (36.6 C), resp. rate 16, SpO2 93 %.  GENERAL:  62 y.o.-year-old patient lying in the bed with no acute distress.  EYES: Pupils equal, round, reactive to light and accommodation. No scleral icterus. Extraocular muscles intact.  HEENT: Head atraumatic, normocephalic. Oropharynx  and nasopharynx clear.  NECK:  Supple, no jugular venous distention. No thyroid enlargement, no tenderness.  LUNGS: Normal breath sounds bilaterally, no wheezing, rales,rhonchi or crepitation. No use of accessory muscles of respiration.  CARDIOVASCULAR: S1, S2 normal. No murmurs, rubs, or gallops.  ABDOMEN: Soft, nontender, nondistended. Bowel sounds present. No organomegaly or mass.  EXTREMITIES: No pedal edema, cyanosis, or clubbing.  Right side below-knee amputation and postsurgical dressing present NEUROLOGIC: Cranial nerves II through XII are intact. Muscle strength 5/5 in all extremities. Sensation intact. Gait not checked.  Moving all 4 limbs but he is confused and does not follow commands. PSYCHIATRIC: The patient is alert and oriented x 0.  SKIN: No obvious rash, lesion, or ulcer.   LABORATORY PANEL:   CBC No results for input(s): WBC, HGB, HCT, PLT, MCV, MCH, MCHC, RDW, LYMPHSABS, MONOABS, EOSABS, BASOSABS, BANDABS in the last 168 hours.  Invalid input(s): NEUTRABS, BANDSABD ------------------------------------------------------------------------------------------------------------------  Chemistries  No results for  input(s): NA, K, CL, CO2, GLUCOSE, BUN, CREATININE, CALCIUM, MG, AST, ALT, ALKPHOS, BILITOT in the last 168 hours.  Invalid input(s): GFRCGP ------------------------------------------------------------------------------------------------------------------ estimated creatinine clearance is 101.5 mL/min (by C-G formula based on SCr of 0.77 mg/dL). ------------------------------------------------------------------------------------------------------------------ No results for input(s): TSH, T4TOTAL, T3FREE, THYROIDAB in the last 72 hours.  Invalid input(s): FREET3   Coagulation profile Recent Labs  Lab 06/02/18 1202  INR 1.10   ------------------------------------------------------------------------------------------------------------------- No results for input(s): DDIMER in the last 72 hours. -------------------------------------------------------------------------------------------------------------------  Cardiac Enzymes No results for input(s): CKMB, TROPONINI, MYOGLOBIN in the last 168 hours.  Invalid input(s): CK ------------------------------------------------------------------------------------------------------------------ Invalid input(s): POCBNP  ---------------------------------------------------------------------------------------------------------------  Urinalysis    Component Value Date/Time   COLORURINE STRAW (A) 06/21/2017 1941   APPEARANCEUR Clear 04/13/2018 1329   LABSPEC 1.036 (H) 06/21/2017 1941   PHURINE 6.0 06/21/2017 1941   GLUCOSEU Trace (A) 04/13/2018 1329   HGBUR NEGATIVE 06/21/2017 1941   BILIRUBINUR Negative 04/13/2018 1329   KETONESUR 5 (A) 06/21/2017 1941   PROTEINUR Negative 04/13/2018 1329   PROTEINUR NEGATIVE 06/21/2017 1941   NITRITE Negative 04/13/2018 1329   NITRITE NEGATIVE 06/21/2017 1941   LEUKOCYTESUR Negative 04/13/2018 1329     RADIOLOGY: No results found.  EKG: Orders placed or performed in visit on 05/16/18  . EKG  12-Lead    IMPRESSION AND PLAN:  *Peripheral arterial disease status post right below-knee amputation. Managed per primary team.  *Diabetes Continue home meds and keep on sliding scale coverage.  *Atrial fibrillation Metoprolol and restarting Eliquis from tomorrow.  *Hypertension Continue home medications.  *Hyperlipidemia Continue statin.  *Altered mental status and confusion Most likely toxic encephalopathy due to anesthesia meds Give lorazepam and Geodon as needed.  All the records are reviewed and case discussed with ED provider. Management plans discussed with the patient, family and they are in agreement.  CODE STATUS: Full code    Code Status Orders  (From admission, onward)         Start     Ordered   06/02/18 1708  Full code  Continuous     06/02/18 1707        Code Status History    Date Active Date Inactive Code Status Order ID Comments User Context   01/13/2018 0950 01/13/2018 1435 Full Code [id number]  [name], MD Inpatient   06/21/2017 2120 06/23/2017 1642 Partial Code [id number]  Salary, [name], MD Inpatient       TOTAL TIME TAKING CARE OF THIS PATIENT: 50 minutes.  Cussed with his wife and nurse in  the room.  Vaughan Basta M.D on 06/02/2018   Between 7am to 6pm - Pager - [phone]  After 6pm go to [email or web address] - password [email or web address] [facility]  Office  [phone]  CC: Primary care physician; [name], DO   Note: This dictation was prepared with Dragon dictation along with smaller phrase technology. Any transcriptional errors that result from this process are unintentional.

## 2018-06-02 NOTE — Anesthesia Postprocedure Evaluation (Signed)
Anesthesia Post Note  Patient: [name].  Procedure(s) Performed: AMPUTATION BELOW KNEE (Right )  Patient location during evaluation: PACU Anesthesia Type: General Level of consciousness: awake and alert Pain management: pain level controlled Vital Signs Assessment: post-procedure vital signs reviewed and stable Respiratory status: spontaneous breathing, nonlabored ventilation, respiratory function stable and patient connected to nasal cannula oxygen Cardiovascular status: blood pressure returned to baseline and stable Postop Assessment: no apparent nausea or vomiting Anesthetic complications: no     Last Vitals:  Vitals:   06/02/18 1623 06/02/18 2025  BP: 114/72 (!) 153/80  Pulse:  91  Resp:  18  Temp: 36.6 C 36.8 C  SpO2:  98%    Last Pain:  Vitals:   06/02/18 2025  TempSrc: Oral  PainSc:                  [name]

## 2018-06-02 NOTE — H&P (Signed)
[facility] History & Physical Update  The patient was interviewed and re-examined.  The patient's previous History and Physical has been reviewed and is unchanged.  There is no change in the plan of care. We plan to proceed with the scheduled procedure.  Leotis Pain, MD  06/02/2018, 1:03 PM

## 2018-06-03 ENCOUNTER — Encounter: Payer: Self-pay | Admitting: Vascular Surgery

## 2018-06-03 DIAGNOSIS — Z89511 Acquired absence of right leg below knee: Secondary | ICD-10-CM

## 2018-06-03 LAB — GLUCOSE, CAPILLARY
GLUCOSE-CAPILLARY: 109 mg/dL — AB (ref 70–99)
Glucose-Capillary: 138 mg/dL — ABNORMAL HIGH (ref 70–99)
Glucose-Capillary: 137 mg/dL — ABNORMAL HIGH (ref 70–99)
Glucose-Capillary: 174 mg/dL — ABNORMAL HIGH (ref 70–99)

## 2018-06-03 LAB — CBC
HEMATOCRIT: 30.9 % — AB (ref 39.0–52.0)
HEMOGLOBIN: 9.3 g/dL — AB (ref 13.0–17.0)
MCH: 23.8 pg — ABNORMAL LOW (ref 26.0–34.0)
MCHC: 30.1 g/dL (ref 30.0–36.0)
MCV: 79.2 fL — ABNORMAL LOW (ref 80.0–100.0)
Platelets: 251 10*3/uL (ref 150–400)
RBC: 3.9 MIL/uL — ABNORMAL LOW (ref 4.22–5.81)
RDW: 17.7 % — ABNORMAL HIGH (ref 11.5–15.5)
WBC: 11.1 10*3/uL — AB (ref 4.0–10.5)
nRBC: 0 % (ref 0.0–0.2)

## 2018-06-03 LAB — BASIC METABOLIC PANEL
Anion gap: 7 (ref 5–15)
BUN: 9 mg/dL (ref 8–23)
CHLORIDE: 103 mmol/L (ref 98–111)
CO2: 27 mmol/L (ref 22–32)
Calcium: 8.5 mg/dL — ABNORMAL LOW (ref 8.9–10.3)
Creatinine, Ser: 0.52 mg/dL — ABNORMAL LOW (ref 0.61–1.24)
Glucose, Bld: 143 mg/dL — ABNORMAL HIGH (ref 70–99)
POTASSIUM: 3.7 mmol/L (ref 3.5–5.1)
SODIUM: 137 mmol/L (ref 135–145)

## 2018-06-03 MED ORDER — KETOROLAC TROMETHAMINE 30 MG/ML IJ SOLN
30.0000 mg | Freq: Four times a day (QID) | INTRAMUSCULAR | Status: AC
  Administered 2018-06-03 – 2018-06-06 (×9): 30 mg via INTRAVENOUS
  Filled 2018-06-03 (×9): qty 1

## 2018-06-03 NOTE — Evaluation (Signed)
Occupational Therapy Evaluation Patient Details Name: [name]. MRN: [id number] DOB: 08/09/1955 Today's Date: 06/03/2018    History of Present Illness Pt with planned BKA on RLE on 06/02/18. Pt has a history of CVA with memory deficits, DM, and HTN.    Clinical Impression   Pt seen for OT evaluation this date. Pt is a 62 y/o male who presented with BKA on RLE on 06/02/18. Pt has a history of CVA with memory deficits. Prior to surgery, pt enjoyed fishing and playing video games. Pt lives with spouse in a house with 4 steps to enter. Pt spouse is available 24/7 for any assistance needed. Currently, pt demonstrated impairments in pain, balance, cognition, ROM, activity tolerance, decreased knowledge of DME/AE equipment and sensation requiring Supervision level assistance for lower body dressing in a seated position and CGA for LB bathing. Pt/spouse educated in desensitization techniques (left handout), energy conservation strategies, falls prevention strategies, and phantom pain mgt. Pt/spouse verbalized understanding of all education presented at this time. Pt will benefit from skilled OT services to address noted impairments and functional deficits in order to maximize safety and independence and minimize fall risk and caregiver burden. OT recommends HHOT upon discharge.     Follow Up Recommendations  Home health OT    Equipment Recommendations  None recommended by OT    Recommendations for Other Services       Precautions / Restrictions Precautions Precautions: Fall Restrictions Weight Bearing Restrictions: Yes RLE Weight Bearing: Non weight bearing      Mobility Bed Mobility     General bed mobility comments: deferred; pt sitting EOB upon arrival.  Transfers           General transfer comment: Deferred; pt remained on EOB for evaluation    Balance Overall balance assessment: No apparent balance deficits (not formally assessed)(Pt sat independently at EOB for  entire session.)                                         ADL either performed or assessed with clinical judgement   ADL Overall ADL's : Needs assistance/impaired Eating/Feeding: Independent;Sitting   Grooming: Sitting;Independent   Upper Body Bathing: Sitting;Modified independent   Lower Body Bathing: Sitting/lateral leans;Min guard   Upper Body Dressing : Sitting;Modified independent   Lower Body Dressing: Supervision/safety;Sitting/lateral leans   Toilet Transfer: RW;Ambulation;Min guard;Cueing for safety           Functional mobility during ADLs: Min guard;Rolling walker;Cueing for safety       Vision Baseline Vision/History: Wears glasses Wears Glasses: At all times Patient Visual Report: No change from baseline;Other (comment)(Blind in R eye)       Perception     Praxis      Pertinent Vitals/Pain Pain Assessment: 0-10 Pain Score: 5  Pain Location: R knee Pain Intervention(s): Monitored during session     Hand Dominance Right   Extremity/Trunk Assessment Upper Extremity Assessment Upper Extremity Assessment: Overall WFL for tasks assessed   Lower Extremity Assessment Lower Extremity Assessment: Defer to PT evaluation       Communication Communication Communication: No difficulties   Cognition Arousal/Alertness: Awake/alert Behavior During Therapy: WFL for tasks assessed/performed Overall Cognitive Status: History of cognitive impairments - at baseline  General Comments: Pt A&O x3. Pt able to follow simple commands. Extra wait time required for recall. Spouse in room to assist with answering questions.    General Comments       Exercises  Other Exercises: Pt/spouse educated in and left handout on desensitization strategies to decrease sensitivity of residual limb. Other Exercises: Pt/spouse educated in falls prevention strategies during ADL and IADL tasks. Other Exercises:  Pt/spouse educated in phantom pain reduction techniques.  Other Exercises: Pt/spouse educated in energy conservation strategies due to unaffected leg fatigue.    Shoulder Instructions      Home Living Family/patient expects to be discharged to:: Private residence Living Arrangements: Spouse/significant other Available Help at Discharge: Family;Available 24 hours/day Type of Home: House Home Access: Stairs to enter [employer] of Steps: 4 Entrance Stairs-Rails: Can reach both;Right;Left Home Layout: One level     Bathroom Shower/Tub: Occupational psychologist: Standard     Home Equipment: Environmental consultant - 2 wheels;Cane - single point          Prior Functioning/Environment Level of Independence: Independent with assistive device(s)        Comments: Pt limited with foot pain. Used RW or SPC in home and community when too painful to walk. Pt independent with all ADL tasks. PT does not drive but has spouse available 24/7 for transportation needs. Pt no longer working.         OT Problem List: Impaired balance (sitting and/or standing);Pain;Decreased cognition;Decreased range of motion;Decreased activity tolerance;Decreased knowledge of use of DME or AE;Impaired sensation      OT Treatment/Interventions: Self-care/ADL training;Therapeutic exercise;Balance training;Patient/family education;Cognitive remediation/compensation;DME and/or AE instruction    OT Goals(Current goals can be found in the care plan section) Acute Rehab OT Goals Patient Stated Goal: to get back to fishing OT Goal Formulation: With patient/family Time For Goal Achievement: 06/17/18 Potential to Achieve Goals: Good ADL Goals Pt Will Perform Grooming: standing;with adaptive equipment;with supervision Pt Will Perform Lower Body Dressing: sit to/from stand;with supervision;with adaptive equipment Additional ADL Goal #1: Pt will independently utilize falls prevention strategies when completing ADL  tasks. Additional ADL Goal #2: Pt will independent utilize at least 2 energy conservation strategies when completing ADL tasks. Additional ADL Goal #3: Pt will independently utilize at least 3 learned residual limb mgt techniques to support recovery and preparation for prosthesis use  OT Frequency: Min 1X/week   Barriers to D/C:            Co-evaluation              AM-PAC PT "6 Clicks" Daily Activity     Outcome Measure Help from another person eating meals?: None Help from another person taking care of personal grooming?: None Help from another person toileting, which includes using toliet, bedpan, or urinal?: A Little Help from another person bathing (including washing, rinsing, drying)?: A Little Help from another person to put on and taking off regular upper body clothing?: None Help from another person to put on and taking off regular lower body clothing?: None 6 Click Score: 22   End of Session    Activity Tolerance: Patient tolerated treatment well Patient left: in bed;with call bell/phone within reach;with family/visitor present  OT Visit Diagnosis: Other abnormalities of gait and mobility (R26.89);Pain Pain - Right/Left: Right Pain - part of body: Leg                Time: 1341-1416 OT Time Calculation (min): 35 min Charges:  Gray Maugeri OTS  06/03/2018, 3:00 PM

## 2018-06-03 NOTE — Progress Notes (Signed)
Patient has been making several attempts to urinate but unable to do so. The last time he was able to void was 2036 with 250 ml. Bladder scan read >584 ml. Dr. Jannifer Franklin notified with a new order for in and out cath x one dose. Patient tolerated the procedure well with 600 mL clear urine output.  Will continue to monitor.

## 2018-06-03 NOTE — Progress Notes (Signed)
Patient is AAOx3, with some forgetfulness.  Pt follows commands and able to use call bell.  Wife at bedside.  No need for sitter on day shift. Dr. Vianne Bulls notified. D/c sitter

## 2018-06-03 NOTE — Care Management Note (Signed)
Case Management Note  Patient Details  Name: [name]. MRN: [id number] Date of Birth: 29-Mar-1956  Subjective/Objective:   Met with patient and his wife at bedside to discuss transition of care. POD # 1 right below the knee amputation. Offered a list of home health agencies. Patient/wife choose [facility] for PT/OT. PT declined any additional services.  And reassess DME closer to DC. Referral to [facility] with [facility]. No DME needs at this time. Will continue to follow                   Action/Plan: PT/OT with [facility]  Expected Discharge Date:                  Expected Discharge Plan:  [place]  In-House Referral:     Discharge planning Services  CM Consult  Post Acute Care Choice:  Home Health Choice offered to:  Patient, Spouse  DME Arranged:    DME Agency:     HH Arranged:  PT, OT HH Agency:  [place]  Status of Service:  In process, will continue to follow  If discussed at Long Length of Stay Meetings, dates discussed:    Additional Comments:  Jolly Mango, RN 06/03/2018, 3:52 PM

## 2018-06-03 NOTE — NC FL2 (Signed)
Organ LEVEL OF CARE SCREENING TOOL     IDENTIFICATION  Patient Name: [name]. Birthdate: 11/14/1955 Sex: male Admission Date (Current Location): 06/02/2018  [place] and [place] Number:  Engineering geologist and Address:  [facility], [address], [place], [place] [address]      Provider Number: [id number]  Attending Physician Name and Address:  [name], MD  Relative Name and Phone Number:       Current Level of Care: Hospital Recommended Level of Care: [place] Prior Approval Number:    Date Approved/Denied:   PASRR Number: [id number])  Discharge Plan: SNF    Current Diagnoses: Patient Active Problem List   Diagnosis Date Noted  . Gangrene of right foot ([place]) 06/02/2018  . Iron deficiency anemia 05/11/2018  . Atherosclerosis of native arteries of the extremities with gangrene ([place]) 05/08/2018  . Iron deficiency anemia due to chronic blood loss 05/08/2018  . Necrotic toes ([place]) 02/21/2018  . A-fib ([place]) 08/20/2017  . PVD (peripheral vascular disease) ([place]) 07/15/2017  . Memory loss 09/27/2015  . 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 Diabetic peripheral neuropathy ([place]) 05/16/2015  . History of stroke with residual deficit 04/01/2015  . Chronic headaches 03/15/2015  . Tobacco abuse 01/24/2015  . Hyperlipidemia 01/21/2015  . Benign hypertensive renal disease 01/21/2015    Orientation RESPIRATION BLADDER Height & Weight     Self, Time, Situation, Place  Normal Continent Weight:   Height:     BEHAVIORAL SYMPTOMS/MOOD NEUROLOGICAL BOWEL NUTRITION STATUS      Continent Diet(Diet: Carb Modified. )  AMBULATORY STATUS COMMUNICATION OF NEEDS Skin   Extensive Assist Verbally Surgical wounds(New Right BKA )                       Personal Care Assistance Level of Assistance  Bathing, Feeding, Dressing Bathing Assistance: Limited  assistance Feeding assistance: Independent Dressing Assistance: Limited assistance     Functional Limitations Info  Sight, Hearing, Speech Sight Info: Adequate Hearing Info: Adequate Speech Info: Adequate    SPECIAL CARE FACTORS FREQUENCY  PT (By licensed PT), OT (By licensed OT)     PT Frequency: (5) OT Frequency: (5)            Contractures      Additional Factors Info  Code Status, Allergies Code Status Info: (Full Code. ) Allergies Info: (Sodium Pentobarbital Pentobarbital, Lipitor Atorvastatin)           Current Medications (06/03/2018):  This is the current hospital active medication list Current Facility-Administered Medications  Medication Dose Route Frequency Provider Last Rate Last Dose  . acetaminophen (TYLENOL) tablet 325-650 mg  325-650 mg Oral Q4H PRN [name], MD       Or  . acetaminophen (TYLENOL) suppository 325-650 mg  325-650 mg Rectal Q4H PRN [name], MD      . alum & mag hydroxide-simeth (MAALOX/MYLANTA) 200-200-20 MG/5ML suspension 15-30 mL  15-30 mL Oral Q2H PRN [name], MD      . apixaban (ELIQUIS) tablet 5 mg  5 mg Oral BID [name], MD   5 mg at 06/03/18 1024  . aspirin chewable tablet 81 mg  81 mg Oral Daily [name], MD   81 mg at 06/03/18 1025  . canagliflozin (INVOKANA) tablet 100 mg  100 mg Oral QAC breakfast Dew, Erskine Squibb, MD      .  cholecalciferol (VITAMIN D) tablet 1,000 Units  1,000 Units Oral Daily [name], MD   1,000 Units at 06/03/18 1024  . diphenhydrAMINE (BENADRYL) capsule 25 mg  25 mg Oral Q6H PRN [name], MD   25 mg at 06/02/18 1732  . docusate sodium (COLACE) capsule 100 mg  100 mg Oral BID PRN [name], MD      . docusate sodium (COLACE) capsule 100 mg  100 mg Oral Daily [name], MD   100 mg at 06/03/18 1024  . simvastatin (ZOCOR) tablet 40 mg  40 mg Oral q1800 Paticia Stack, [place]   40 mg at 06/03/18 1703   And  . ezetimibe (ZETIA) tablet 10 mg  10 mg Oral Daily Cyndee Brightly M,  [place]   10 mg at 06/03/18 1025  . famotidine (PEPCID) IVPB 20 mg premix  20 mg Intravenous Q12H [name], MD 100 mL/hr at 06/03/18 1029 20 mg at 06/03/18 1029  . ferrous sulfate tablet 325 mg  325 mg Oral Q breakfast [name], MD   325 mg at 06/03/18 0844  . gabapentin (NEURONTIN) capsule 400 mg  400 mg Oral TID [name], MD   400 mg at 06/03/18 1025  . guaiFENesin-dextromethorphan (ROBITUSSIN DM) 100-10 MG/5ML syrup 15 mL  15 mL Oral Q4H PRN [name], MD      . hydrALAZINE (APRESOLINE) injection 5 mg  5 mg Intravenous Q20 Min PRN [name], MD      . hydrochlorothiazide (MICROZIDE) capsule 12.5 mg  12.5 mg Oral Daily [name], MD   12.5 mg at 06/03/18 1024  . insulin aspart (novoLOG) injection 0-15 Units  0-15 Units Subcutaneous TID WC [name], MD   3 Units at 06/03/18 1703  . ketorolac (TORADOL) 30 MG/ML injection 30 mg  30 mg Intravenous Q6H Stegmayer, Kimberly A, PA-C   30 mg at 06/03/18 1626  . labetalol (NORMODYNE,TRANDATE) injection 10 mg  10 mg Intravenous Q10 min PRN [name], MD      . magnesium sulfate IVPB 2 g 50 mL  2 g Intravenous Daily PRN [name], MD      . metFORMIN (GLUCOPHAGE) tablet 1,000 mg  1,000 mg Oral BID WC [name], MD   1,000 mg at 06/03/18 1703  . metoprolol tartrate (LOPRESSOR) injection 2-5 mg  2-5 mg Intravenous Q2H PRN [name], MD      . metoprolol tartrate (LOPRESSOR) tablet 25 mg  25 mg Oral BID [name], MD   25 mg at 06/03/18 1025  . morphine 2 MG/ML injection 2-5 mg  2-5 mg Intravenous Q1H PRN [name], MD   4 mg at 06/03/18 1026  . multivitamin with minerals tablet 1 tablet  1 tablet Oral Daily [name], MD   1 tablet at 06/03/18 1024  . ondansetron (ZOFRAN) injection 4 mg  4 mg Intravenous Q6H PRN [name], MD      . oxyCODONE-acetaminophen (PERCOCET/ROXICET) 5-325 MG per tablet 2 tablet  2 tablet Oral Q6H PRN [name], MD   2 tablet at 06/03/18 1231  . phenol (CHLORASEPTIC) mouth spray 1 spray   1 spray Mouth/Throat PRN Dew, Erskine Squibb, MD      . polyethylene glycol (MIRALAX / GLYCOLAX) packet 17 g  17 g Oral Daily PRN [name], MD      . potassium chloride SA (K-DUR,KLOR-CON) CR  tablet 20-40 mEq  20-40 mEq Oral Daily PRN [name], MD      . sorbitol 70 % solution 30 mL  30 mL Oral Daily PRN [name], MD      . ziprasidone (GEODON) injection 10 mg  10 mg Intramuscular Once [name], MD         Discharge Medications: Please see discharge summary for a list of discharge medications.  Relevant Imaging Results:  Relevant Lab Results:   Additional Information (SSN: [id number])  Lailani Tool, Veronia Beets, LCSW

## 2018-06-03 NOTE — Progress Notes (Signed)
Clinical Social Worker (CSW) received SNF consult. PT is recommending home health. RN case manager aware of above. Please reconsult if future social work needs arise. CSW signing off.   Baer Hinton, LCSW [phone] 

## 2018-06-03 NOTE — Progress Notes (Signed)
[name]  Daily Progress Note   Subjective: 1 Day Post-Op:  Right below-the-knee amputation  Seen with wife at bedside.  Patient's pain is under control with pain medication.  No issues overnight.  Objective: Vitals:   06/02/18 2025 06/03/18 0023 06/03/18 0800 06/03/18 1100  BP: (!) 153/80 134/69 132/75 (!) 142/69  Pulse: 91 77 83 87  Resp: 18 18 16    Temp: 98.3 F (36.8 C) 100 F (37.8 C) 98.1 F (36.7 C) (!) 97.4 F (36.3 C)  TempSrc: Oral Oral Oral Oral  SpO2: 98% 95% 96%     Intake/Output Summary (Last 24 hours) at 06/03/2018 1345 Last data filed at 06/03/2018 1100 Gross per 24 hour  Intake 2215 ml  Output 2650 ml  Net -435 ml   Physical Exam: A&Ox3, NAD CV: RRR Pulmonary: CTA Bilaterally Abdomen: Soft, Nontender, Nondistended Vascular:  Right Lower Extremity: OR dressing intact, clean and dry. Thigh is soft.    Laboratory: CBC    Component Value Date/Time   WBC 11.1 (H) 06/03/2018 0454   HGB 9.3 (L) 06/03/2018 0454   HGB 11.0 (L) 05/06/2018 1624   HCT 30.9 (L) 06/03/2018 0454   HCT 34.2 (L) 05/06/2018 1624   PLT 251 06/03/2018 0454   PLT 309 05/06/2018 1624   BMET    Component Value Date/Time   NA 137 06/03/2018 0454   NA 139 05/06/2018 1624   K 3.7 06/03/2018 0454   CL 103 06/03/2018 0454   CO2 27 06/03/2018 0454   GLUCOSE 143 (H) 06/03/2018 0454   BUN 9 06/03/2018 0454   BUN 11 05/06/2018 1624   CREATININE 0.52 (L) 06/03/2018 0454   CALCIUM 8.5 (L) 06/03/2018 0454   GFRNONAA >60 06/03/2018 0454   GFRAA >60 06/03/2018 0454   Assessment/Planning: 62 year old male with a past medical history of severe peripheral artery disease status post a right below the knee amputation POD #1 - Stable 1) H&H stable 2) Creatinine stable 2) Awaiting first PT note - however high recommend rehab upon discharge. Will consult social work to assist. 3) Started Toradol for better pain control. Follow daily BMP. 4) Had a long conversation  with the patient in regard to keeping his knee straight.  He understands that if he allows his knee to stay in a bent position he runs the risk of contracture and not being able to walk with prosthetic.  The patient expresses understanding. 5) Patient asking if he can get out of bed.  Reviewed the high risk of potential fall and trauma to his new stump.  Reinforced asking for help. 6) Physical Therapy 7) Foley removed. Patient making good urine  Marcelle Overlie PA-C 06/03/2018 1:45 PM

## 2018-06-03 NOTE — Plan of Care (Signed)

## 2018-06-03 NOTE — Progress Notes (Signed)
[name] at [facility]    MR#:  [id number]  DATE OF BIRTH:  03/01/1956  SUBJECTIVE: Still follow-up for medical management.  Patient had confusion after surgery, had one-to-one sitter last night.  This morning he is awake, alert, oriented.  Complains of pain in the right knee amputated site but no other complaints.  CHIEF COMPLAINT:  No chief complaint on file.   REVIEW OF SYSTEMS:   ROS CONSTITUTIONAL: No fever, fatigue or weakness.  EYES: No blurred or double vision.  EARS, NOSE, AND THROAT: No tinnitus or ear pain.  RESPIRATORY: No cough, shortness of breath, wheezing or hemoptysis.  CARDIOVASCULAR: No chest pain, orthopnea, edema.  GASTROINTESTINAL: No nausea, vomiting, diarrhea or abdominal pain.  GENITOURINARY: No dysuria, hematuria.  ENDOCRINE: No polyuria, nocturia,  HEMATOLOGY: No anemia, easy bruising or bleeding SKIN: No rash or lesion. MUSCULOSKELETAL: Postoperative pain. NEUROLOGIC: No tingling, numbness, weakness.  PSYCHIATRY: No anxiety or depression.   DRUG ALLERGIES:   Allergies  Allergen Reactions  . Sodium Pentobarbital [Pentobarbital] Shortness Of Breath  . Lipitor [Atorvastatin] Rash    VITALS:  Blood pressure (!) 142/69, pulse 87, temperature (!) 97.4 F (36.3 C), temperature source Oral, resp. rate 16, SpO2 96 %.  PHYSICAL EXAMINATION:  GENERAL:  62 y.o.-year-old patient lying in the bed with no acute distress.  Sleepy this morning.  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 EYES: Pupils equal, round, reactive to light and accommodation. No scleral icterus. Extraocular muscles intact.  HEENT: Head atraumatic, normocephalic. Oropharynx and nasopharynx clear.  NECK:  Supple, no jugular venous distention. No thyroid enlargement, no tenderness.  LUNGS: Normal breath sounds bilaterally, no wheezing, rales,rhonchi or  crepitation. No use of accessory muscles of respiration.  CARDIOVASCULAR: S1, S2 normal. No murmurs, rubs, or gallops.  ABDOMEN: Soft, nontender, nondistended. Bowel sounds present. No organomegaly or mass.  EXTREMITIES: DRESSING present at the right BKA site. NEUROLOGIC: Cranial nerves II through XII are intact. Muscle strength 5/5 in all extremities. Sensation intact. Gait not checked.  PSYCHIATRIC: The patient is alert and oriented x 3.  SKIN: No obvious rash, lesion, or ulcer.    LABORATORY PANEL:   CBC Recent Labs  Lab 06/03/18 0454  WBC 11.1*  HGB 9.3*  HCT 30.9*  PLT 251   ------------------------------------------------------------------------------------------------------------------  Chemistries  Recent Labs  Lab 06/03/18 0454  NA 137  K 3.7  CL 103  CO2 27  GLUCOSE 143*  BUN 9  CREATININE 0.52*  CALCIUM 8.5*   ------------------------------------------------------------------------------------------------------------------  Cardiac Enzymes No results for input(s): TROPONINI in the last 168 hours. ------------------------------------------------------------------------------------------------------------------  RADIOLOGY:  No results found.  EKG:   Orders placed or performed in visit on 05/16/18  . EKG 12-Lead    ASSESSMENT AND PLAN:   62 year old gentleman with multiple medical problems of diabetes mellitus type 2, chronic A. fib, hypertension, hyperlipidemia, PAD, admitted to vascular service for right BKA but patient got confused last night and had a Actuary. 1.  Acute confusion likely due to anesthesia: Resolved, alert, awake, oriented this morning, started on the diet, discontinue sitter watch closely, physical therapy to see the patient. 2.  PAD status post right below-knee amputation, management as per vascular surgery 3.  Chronic A. fib: Patient is on beta-blockers, Eliquis 4.  Essential hypertension: Controlled 5 diabetes mellitus type 2:  Patient is on Invokana.,  Metformin, sliding scale insulin  with coverage.    All the records are reviewed and case discussed with Care Management/Social Workerr. Management plans discussed with the patient, family and they are in agreement.  CODE STATUS: Full code  TOTAL TIME TAKING CARE OF THIS PATIENT: 38 minutes.   POSSIBLE D/C IN 1-2DAYS, DEPENDING ON CLINICAL CONDITION.   Epifanio Lesches M.D on 06/03/2018 at 12:18 PM  Between 7am to 6pm - Pager - [phone]  After 6pm go to [email or web address] - password [email or web address] [facility]  Office  [phone]  CC: Primary care physician; [name], DO   Note: This dictation was prepared with Dragon dictation along with smaller phrase technology. Any transcriptional errors that result from this process are unintentional.

## 2018-06-03 NOTE — Evaluation (Signed)
Physical Therapy Evaluation Patient Details Name: [name]. MRN: [id number] DOB: April 24, 1956 Today's Date: 06/03/2018   History of Present Illness  Pt with planned BKA on RLE on 06/02/18. Pt has a history of CVA with memory deficits, DM, and HTN.   Clinical Impression  Upon arrival pt pleasant A&Ox3 with wife at bedside. Wife indicating pt typically has memory deficits and is at cognitive baseline. Pt with good effort during exercises and all mobility. Pt unable to achieve full knee extension at this time limited by pain. With active exercises pt limited 30 deg from full extension with the addition of light overpressure. Pt having difficulty properly activating quadriceps. Good management of R LE with bed mobility and no physical assist required. Pt requiring VC for hand placement during STS but otherwise able to perform without assistance. Pt is slow ambulating but able to fully off weight with UE's to take a step with LLE. Difficulty with following commands for direction but able to ambulate to chair without physical assist. Significant time spent on education for the importance of achieving full knee extension and implications for practicing knee extension exercises independently. Pt is appropriate to return home with supervision for OOB mobility and HHPT for continued improvements with functional independency.     Follow Up Recommendations Home health PT;Supervision for mobility/OOB    Equipment Recommendations  (RW at home)    Recommendations for Other Services       Precautions / Restrictions Precautions Precautions: Fall Restrictions Weight Bearing Restrictions: Yes RLE Weight Bearing: Non weight bearing      Mobility  Bed Mobility Overal bed mobility: Needs Assistance Bed Mobility: Supine to Sit     Supine to sit: Supervision     General bed mobility comments: No physical assist required pt managing RLE independently.   Transfers Overall transfer level: Needs  assistance Equipment used: Rolling walker (2 wheeled) Transfers: Sit to/from Stand Sit to Stand: Min guard         General transfer comment: Pt confused where to place hands but once positioned with tactile cues, pt able to stand without physical assist. Steady upon standing with use of RW.   Ambulation/Gait Ambulation/Gait assistance: Min guard Gait Distance (Feet): 5 Feet Assistive device: Rolling walker (2 wheeled)       General Gait Details: Pt taking short steps with LLE but able to manage without physical assist. UE strength adequate for fully unweighting body for LLE step. Significant VC required for direction and positioning when getting to chair.   Stairs            Wheelchair Mobility    Modified Rankin (Stroke Patients Only)       Balance Overall balance assessment: No apparent balance deficits (not formally assessed)                                           Pertinent Vitals/Pain Pain Assessment: 0-10 Pain Score: 6  Pain Location: R knee Pain Intervention(s): Monitored during session;Premedicated before session    [place] expects to be discharged to:: Private residence Living Arrangements: Spouse/significant other Available Help at Discharge: Family;Available 24 hours/day Type of Home: House Home Access: Stairs to enter Entrance Stairs-Rails: Can reach both;Right;Left Entrance Stairs-Number of Steps: 4 Home Layout: One level Home Equipment: Walker - 2 wheels;Cane - single point      Prior Function Level of Independence:  Independent with assistive device(s)         Comments: Pt has been limited by R foot pain and has been using a RW and cane depending on how he felt. Otherwise independent with basic mobility and ADL's.      Hand Dominance        Extremity/Trunk Assessment   Upper Extremity Assessment Upper Extremity Assessment: Overall WFL for tasks assessed    Lower Extremity Assessment Lower  Extremity Assessment: Generalized weakness(R LE with expected post op deficits grossly 3/5 except for knee extension limited 30 deg from neutral)       Communication      Cognition Arousal/Alertness: Awake/alert Behavior During Therapy: WFL for tasks assessed/performed Overall Cognitive Status: History of cognitive impairments - at baseline                                        General Comments      Exercises Total Joint Exercises Quad Sets: AAROM;Right;15 reps;Supine Short Arc Quad: AAROM;Right;10 reps;Supine Hip ABduction/ADduction: AROM;Right;10 reps;Supine Straight Leg Raises: AROM;Right;10 reps;Supine Goniometric ROM: R knee ROM: 30-80   Assessment/Plan    PT Assessment Patient needs continued PT services  PT Problem List Decreased strength;Decreased range of motion;Decreased knowledge of use of DME;Decreased activity tolerance;Decreased safety awareness;Decreased balance;Decreased mobility;Pain       PT Treatment Interventions Balance training;DME instruction;Modalities;Gait training;Stair training;Functional mobility training;Patient/family education;Therapeutic activities;Therapeutic exercise    PT Goals (Current goals can be found in the Care Plan section)  Acute Rehab PT Goals Patient Stated Goal: to have less pain and get knee straight PT Goal Formulation: With patient/family Time For Goal Achievement: 06/03/18 Potential to Achieve Goals: Good    Frequency 7X/week   Barriers to discharge        Co-evaluation               AM-PAC PT "6 Clicks" Daily Activity  Outcome Measure Difficulty turning over in bed (including adjusting bedclothes, sheets and blankets)?: A Little Difficulty moving from lying on back to sitting on the side of the bed? : A Little Difficulty sitting down on and standing up from a chair with arms (e.g., wheelchair, bedside commode, etc,.)?: A Little Help needed moving to and from a bed to chair (including a  wheelchair)?: A Little Help needed walking in hospital room?: A Lot Help needed climbing 3-5 steps with a railing? : A Lot 6 Click Score: 16    End of Session Equipment Utilized During Treatment: Gait belt Activity Tolerance: Patient tolerated treatment well;Patient limited by pain Patient left: in chair;with call bell/phone within reach;with chair alarm set;with family/visitor present(R LE elevated ) Nurse Communication: Mobility status PT Visit Diagnosis: Difficulty in walking, not elsewhere classified (R26.2);Pain;Muscle weakness (generalized) (M62.81);Other abnormalities of gait and mobility (R26.89) Pain - Right/Left: Right Pain - part of body: Leg    Time: 8250-5397 PT Time Calculation (min) (ACUTE ONLY): 30 min   Charges:              Ernie Avena, SPT 06/03/2018, 11:58 AM

## 2018-06-04 LAB — CBC
HEMATOCRIT: 29.1 % — AB (ref 39.0–52.0)
HEMOGLOBIN: 8.8 g/dL — AB (ref 13.0–17.0)
MCH: 24 pg — ABNORMAL LOW (ref 26.0–34.0)
MCHC: 30.2 g/dL (ref 30.0–36.0)
MCV: 79.3 fL — ABNORMAL LOW (ref 80.0–100.0)
Platelets: 261 10*3/uL (ref 150–400)
RBC: 3.67 MIL/uL — AB (ref 4.22–5.81)
RDW: 17.9 % — ABNORMAL HIGH (ref 11.5–15.5)
WBC: 9.9 10*3/uL (ref 4.0–10.5)
nRBC: 0 % (ref 0.0–0.2)

## 2018-06-04 LAB — GLUCOSE, CAPILLARY
GLUCOSE-CAPILLARY: 157 mg/dL — AB (ref 70–99)
GLUCOSE-CAPILLARY: 180 mg/dL — AB (ref 70–99)
Glucose-Capillary: 151 mg/dL — ABNORMAL HIGH (ref 70–99)
Glucose-Capillary: 118 mg/dL — ABNORMAL HIGH (ref 70–99)

## 2018-06-04 LAB — BASIC METABOLIC PANEL
ANION GAP: 7 (ref 5–15)
BUN: 14 mg/dL (ref 8–23)
CHLORIDE: 102 mmol/L (ref 98–111)
CO2: 28 mmol/L (ref 22–32)
Calcium: 8.7 mg/dL — ABNORMAL LOW (ref 8.9–10.3)
Creatinine, Ser: 0.7 mg/dL (ref 0.61–1.24)
GFR calc Af Amer: >60 mL/min (ref >60)
GFR calc non Af Amer: >60 mL/min (ref >60)
GLUCOSE: 152 mg/dL — AB (ref 70–99)
POTASSIUM: 3.7 mmol/L (ref 3.5–5.1)
Sodium: 137 mmol/L (ref 135–145)

## 2018-06-04 LAB — MAGNESIUM: MAGNESIUM: 1.9 mg/dL (ref 1.7–2.4)

## 2018-06-04 MED ORDER — FAMOTIDINE 20 MG PO TABS
20.0000 mg | ORAL_TABLET | Freq: Two times a day (BID) | ORAL | Status: DC
  Administered 2018-06-04 – 2018-06-06 (×4): 20 mg via ORAL
  Filled 2018-06-04 (×4): qty 1

## 2018-06-04 NOTE — Progress Notes (Signed)
[facility] [place] NAME: Nat Lowenthal    MR#:  [id number]  DATE OF BIRTH:  06/05/1956    No chief complaint on file.  Has some right knee pain REVIEW OF SYSTEMS:   ROS CONSTITUTIONAL: No fever, fatigue or weakness.  EYES: No blurred or double vision.  EARS, NOSE, AND THROAT: No tinnitus or ear pain.  RESPIRATORY: No cough, shortness of breath, wheezing or hemoptysis.  CARDIOVASCULAR: No chest pain, orthopnea, edema.  GASTROINTESTINAL: No nausea, vomiting, diarrhea or abdominal pain.  GENITOURINARY: No dysuria, hematuria.  ENDOCRINE: No polyuria, nocturia,  HEMATOLOGY: No anemia, easy bruising or bleeding SKIN: No rash or lesion. MUSCULOSKELETAL: Postoperative pain. NEUROLOGIC: No tingling, numbness, weakness.  PSYCHIATRY: No anxiety or depression.   DRUG ALLERGIES:   Allergies  Allergen Reactions  . Sodium Pentobarbital [Pentobarbital] Shortness Of Breath  . Lipitor [Atorvastatin] Rash    VITALS:  Blood pressure 140/71, pulse 89, temperature 98.9 F (37.2 C), temperature source Oral, resp. rate 18, SpO2 97 %.  PHYSICAL EXAMINATION:  GENERAL:  62 y.o.-year-old patient lying in the bed with no acute distress.  Sleepy this morning.  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 EYES: Pupils equal, round, reactive to light and accommodation. No scleral icterus. Extraocular muscles intact.  HEENT: Head atraumatic, normocephalic. Oropharynx and nasopharynx clear.  NECK:  Supple, no jugular venous distention. No thyroid enlargement, no tenderness.  LUNGS: Normal breath sounds bilaterally, no wheezing, rales,rhonchi or crepitation. No use of accessory muscles of respiration.  CARDIOVASCULAR: S1, S2 normal. No murmurs, rubs, or gallops.  ABDOMEN: Soft, nontender, nondistended. Bowel sounds present. No organomegaly or mass.  EXTREMITIES: DRESSING present at the right  BKA site. NEUROLOGIC: Cranial nerves II through XII are intact. Muscle strength 5/5 in all extremities. Sensation intact. Gait not checked.  PSYCHIATRIC: The patient is alert and oriented x 3.  SKIN: No obvious rash, lesion, or ulcer.    LABORATORY PANEL:   CBC Recent Labs  Lab 06/04/18 0512  WBC 9.9  HGB 8.8*  HCT 29.1*  PLT 261   ------------------------------------------------------------------------------------------------------------------  Chemistries  Recent Labs  Lab 06/04/18 0512  NA 137  K 3.7  CL 102  CO2 28  GLUCOSE 152*  BUN 14  CREATININE 0.70  CALCIUM 8.7*  MG 1.9   ------------------------------------------------------------------------------------------------------------------  Cardiac Enzymes No results for input(s): TROPONINI in the last 168 hours. ------------------------------------------------------------------------------------------------------------------  RADIOLOGY:  No results found.  EKG:   Orders placed or performed in visit on 05/16/18  . EKG 12-Lead    ASSESSMENT AND PLAN:   62 year old gentleman with multiple medical problems of diabetes mellitus type 2, chronic A. fib, hypertension, hyperlipidemia, PAD, admitted to vascular service for right BKA but patient got confused last night and had a Actuary. 1.  Acute confusion likely due to anesthesia: Resolved,  Right below-knee amputation,  physical therapy, recommendshome health physical therapy.  2.  PAD status post right below-knee amputation, management as per vascular surgery 3.  Chronic A. fib: Patient is on beta-blockers, Eliquis 4.  Essential hypertension: Controlled 5 diabetes mellitus type 2: Patient is on Invokana.,  Metformin, sliding scale insulin with coverage. Will sign off, recall if needed   All the records are reviewed and case discussed with Care Management/Social Workerr. Management plans discussed with the patient, family and they are in agreement.  CODE  STATUS: Full code  TOTAL TIME TAKING  CARE OF THIS PATIENT: 38 minutes.   POSSIBLE D/C IN 1-2DAYS, DEPENDING ON CLINICAL CONDITION.   Epifanio Lesches M.D on 06/04/2018 at 1:50 PM  Between 7am to 6pm - Pager - [phone]  After 6pm go to [email or web address] - password [email or web address] [facility]  Office  [phone]  CC: Primary care physician; [name], DO   Note: This dictation was prepared with Dragon dictation along with smaller phrase technology. Any transcriptional errors that result from this process are unintentional.

## 2018-06-04 NOTE — Progress Notes (Signed)
PHARMACIST - PHYSICIAN COMMUNICATION  DR:   Lucky Cowboy  CONCERNING: IV to Oral Route Change Policy  RECOMMENDATION: This patient is receiving famotidine by the intravenous route.  Based on criteria approved by the Pharmacy and Therapeutics Committee, the intravenous medication(s) is/are being converted to the equivalent oral dose form(s).   DESCRIPTION: These criteria include:  The patient is eating (either orally or via tube) and/or has been taking other orally administered medications for a least 24 hours  The patient has no evidence of active gastrointestinal bleeding or impaired GI absorption (gastrectomy, short bowel, patient on TNA or NPO).  If you have questions about this conversion, please contact the Pharmacy Department  []   [phone] )  [facility] [x]   [phone] )  [facility] []   [phone] )  [facility] []   [phone] )  [facility] []   [phone] )  [place], [facility] 06/04/2018 12:40 PM

## 2018-06-04 NOTE — Progress Notes (Signed)
Occupational Therapy Treatment Patient Details Name: [name]. MRN: [id number] DOB: 1955/09/11 Today's Date: 06/04/2018    History of present illness Pt is a 62 y.o. male who was admitted to [facility] for a  planned BKA on RLE on 06/02/18. Pt. PMHx includes: CVA with memory deficits, DM, and HTN.    OT comments  Pt. education was provided about energy conservation, work simplification techniques, and positioning. Fatigue was a limiting factor today. Pt. continues to need follow-up OT services for ADL training, A/E training, and pt. education about desensitization, home modification, and DME. Pt. Plans to return home upon discharge with family to assist pt. as needed. Pt. continues to need  follow-up HHOT services upon discharge.   Follow Up Recommendations  Home health OT    Equipment Recommendations  None recommended by OT    Recommendations for Other Services      Precautions / Restrictions Precautions Precautions: Fall Restrictions Weight Bearing Restrictions: Yes RLE Weight Bearing: Non weight bearing       Mobility Pt. Seen at bed level secondary to fatigue  Balance                         ADL either performed or assessed with clinical judgement   ADL Overall ADL's : Needs assistance/impaired Eating/Feeding: Independent;Sitting   Grooming: Sitting;Independent   Upper Body Bathing: Independent;Bed level   Lower Body Bathing: Minimal assistance;Bed level   Upper Body Dressing : Modified independent;Bed level   Lower Body Dressing: Bed level;Supervision/safety                 General ADL Comments: Fatigue is limiting pt.     Vision Baseline Vision/History: Wears glasses Wears Glasses: At all times Patient Visual Report: No change from baseline     Perception     Praxis      Cognition Arousal/Alertness: Awake/alert(Fatigue) Behavior During Therapy: Flat affect Overall Cognitive Status: Difficult to assess                                           Exercises   Shoulder Instructions       General Comments      Pertinent Vitals/ Pain       Pain Assessment: 0-10 Pain Score: 6  Pain Location: Right  Knee Pain Descriptors / Indicators: Aching Pain Intervention(s): Limited activity within patient's tolerance;Monitored during session  Home Living                                          Prior Functioning/Environment              Frequency  Min 1X/week        Progress Toward Goals  OT Goals(current goals can now be found in the care plan section)  Progress towards OT goals: Progressing toward goals  Acute Rehab OT Goals Patient Stated Goal: To regain independence OT Goal Formulation: With patient Potential to Achieve Goals: Good  Plan      Co-evaluation                 AM-PAC PT "6 Clicks" Daily Activity     Outcome Measure   Help from another person eating meals?: None Help from another person taking care of  personal grooming?: None Help from another person toileting, which includes using toliet, bedpan, or urinal?: A Little Help from another person bathing (including washing, rinsing, drying)?: A Little Help from another person to put on and taking off regular upper body clothing?: None Help from another person to put on and taking off regular lower body clothing?: A Little 6 Click Score: 21    End of Session    OT Visit Diagnosis: Pain;Other abnormalities of gait and mobility (R26.89) Pain - Right/Left: Right Pain - part of body: Leg   Activity Tolerance Patient tolerated treatment well   Patient Left in bed;with call bell/phone within reach;with family/visitor present   Nurse Communication          Time: 8022-3361 OT Time Calculation (min): 8 min  Charges: OT Treatments $Self Care/Home Management : 8-22 mins   Harrel Carina, MS, OTR/L  Harrel Carina 06/04/2018, 1:49 PM

## 2018-06-04 NOTE — Progress Notes (Addendum)
Physical Therapy Treatment Patient Details Name: [name]. MRN: [id number] DOB: 1956-03-06 Today's Date: 06/04/2018    History of Present Illness Pt with planned BKA on RLE on 06/02/18. Pt has a history of CVA with memory deficits, DM, and HTN.     PT Comments    Split session:  9:18-9:41 Pt in bed, ready to participate.  Session began with supine tx for LLE including quad sets, SLR, ab/add and hip.knee flexion x 10.  Emphasis and education regarding knee extension and strategies to increase ROM.  Overall improved today but still lacks about 15 degrees.  RN in and stated Vascular MD ordered KI to assist with knee ext.  RN obtained and I assisted with donning.  Pt tolerated minimal tightening of straps.  To edge of bed with increased time but able to do with use of rails.  Sitting balance without assist.  Pt required extensive education throughout session regarding hand placements for safe transfers.  Stood with minA/gaurd  +2 for safety as fear was a limiting factor today.  He was able to transfer bed->recliner->bed ->recliner with min guard +2.  Limited by fatigue but remained in recliner after session.  9:58-10:24 Pt in recliner wanting to get back to bed due to general discomfort.  Declined further time up despite encouragement.  Stood with min guard/assist +2 and transferred to bed.  Once sitting, stated he needed to use the bathroom.  Stood and transferred to bedside commode for BM and standing to void.  Returned to bed with same assist.  Pt requested to remove KI.  Educated on reasons for it's use and he agreed to keep it on but asked for straps to be loosened. Encouraged pt to get up again later today with nursing staff.  Pt continues with limited mobility skills but is able to complete functional transfers with limited assist.  He is unable to navigate his home environment in a walker at this time and a wheelchair will be needed for a safe discharge if he returns home.  He stated  he is still comfortable with discharge to home.   He does report several stairs into his home which will be quite challenging for pt due to strength, endurance and overall confidence.  Pt may benefit from EMS transfer home to assist pt in getting into his home.    Patient suffers from [place] which impairs his/her ability to perform daily activities including household and community ambulation in the home. A walker will not resolve the patient's issue with performing activities of daily living. A wheelchair is required/recommended and will allow patient to safely perform daily activities.   Patient can safely propel the wheelchair in the home or has a caregiver who can provide assistance.     Follow Up Recommendations  Home health PT;Supervision for mobility/OOB     Equipment Recommendations  Rolling walker with 5" wheels;Wheelchair (measurements PT) , bedside commode, cushion   Recommendations for Other Services       Precautions / Restrictions Precautions Precautions: Fall Restrictions Weight Bearing Restrictions: Yes RLE Weight Bearing: Non weight bearing    Mobility  Bed Mobility Overal bed mobility: Needs Assistance Bed Mobility: Supine to Sit;Sit to Supine     Supine to sit: Supervision Sit to supine: Supervision   General bed mobility comments: increased time  Transfers Overall transfer level: Needs assistance Equipment used: Rolling walker (2 wheeled) Transfers: Sit to/from Stand Sit to Stand: Min guard;+2 safety/equipment  General transfer comment: +1-+2 for safety due to confidence  Ambulation/Gait Ambulation/Gait assistance: Min guard Gait Distance (Feet): 5 Feet Assistive device: Rolling walker (2 wheeled) Gait Pattern/deviations: Step-to pattern     General Gait Details: hesitant today due to pain   Stairs             Wheelchair Mobility    Modified Rankin (Stroke Patients Only)       Balance Overall balance assessment: Needs  assistance Sitting-balance support: Feet supported Sitting balance-Leahy Scale: Good     Standing balance support: Bilateral upper extremity supported Standing balance-Leahy Scale: Fair Standing balance comment: heavy reliance on walker                            Cognition Arousal/Alertness: Awake/alert Behavior During Therapy: WFL for tasks assessed/performed Overall Cognitive Status: Within Functional Limits for tasks assessed                                        Exercises Other Exercises Other Exercises: focus on transfers and mobility skills.    General Comments        Pertinent Vitals/Pain Pain Assessment: 0-10 Pain Score: 5  Pain Location: R knee Pain Descriptors / Indicators: Operative site guarding;Sore Pain Intervention(s): Limited activity within patient's tolerance;Monitored during session    Home Living                      Prior Function            PT Goals (current goals can now be found in the care plan section) Progress towards PT goals: Progressing toward goals    Frequency    7X/week      PT Plan Current plan remains appropriate    Co-evaluation              AM-PAC PT "6 Clicks" Daily Activity  Outcome Measure  Difficulty turning over in bed (including adjusting bedclothes, sheets and blankets)?: None Difficulty moving from lying on back to sitting on the side of the bed? : A Little Difficulty sitting down on and standing up from a chair with arms (e.g., wheelchair, bedside commode, etc,.)?: Unable Help needed moving to and from a bed to chair (including a wheelchair)?: A Little Help needed walking in hospital room?: A Lot Help needed climbing 3-5 steps with a railing? : Total 6 Click Score: 14    End of Session Equipment Utilized During Treatment: Gait belt Activity Tolerance: Patient tolerated treatment well;Patient limited by pain;Patient limited by fatigue Patient left: in bed;with  call bell/phone within reach;with bed alarm set;with nursing/sitter in room Nurse Communication: Mobility status Pain - Right/Left: Right Pain - part of body: Leg     Time: 8325-4982 PT Time Calculation (min) (ACUTE ONLY): 49 min  Charges:  $Gait Training: 8-22 mins $Therapeutic Exercise: 23-37 mins                     Chesley Noon, PTA 06/04/18, 10:44 AM

## 2018-06-04 NOTE — Progress Notes (Signed)
Verbal order received by MD Vianne Bulls to d/c safety sitter. Order d/c.

## 2018-06-04 NOTE — Progress Notes (Signed)
Subjective: Interval History: has complaints pain at the left below-knee amputation site..   Objective: Vital signs in last 24 hours: Temp:  [97.4 F (36.3 C)-98.9 F (37.2 C)] 98.9 F (37.2 C) (11/02 0805) Pulse Rate:  [74-96] 89 (11/02 0805) Resp:  [16-18] 18 (11/02 0805) BP: (119-142)/(63-71) 140/71 (11/02 0805) SpO2:  [97 %-99 %] 97 % (11/02 0805)  Intake/Output from previous day: 11/01 0701 - 11/02 0700 In: 410 [P.O.:360; IV Piggyback:50] Out: 1050 [Urine:1050] Intake/Output this shift: No intake/output data recorded.  General appearance: alert, cooperative and mild distress Head: Normocephalic, without obvious abnormality, atraumatic Neck: no JVD and supple, symmetrical, trachea midline Resp: No accessory muscle use Extremities: Right below-knee amputation site with slight preferred flexion, incisional site clean, dry, intact with slight ecchymosis Skin: Skin color, texture, turgor normal. No rashes or lesions  Lab Results: Recent Labs    06/03/18 0454 06/04/18 0512  WBC 11.1* 9.9  HGB 9.3* 8.8*  HCT 30.9* 29.1*  PLT 251 261   BMET Recent Labs    06/03/18 0454 06/04/18 0512  NA 137 137  K 3.7 3.7  CL 103 102  CO2 27 28  GLUCOSE 143* 152*  BUN 9 14  CREATININE 0.52* 0.70  CALCIUM 8.5* 8.7*    Studies/Results: No results found. Anti-infectives: Anti-infectives (From admission, onward)   Start     Dose/Rate Route Frequency Ordered Stop   06/02/18 1800  ceFAZolin (ANCEF) IVPB 2g/100 mL premix  Status:  Discontinued     2 g 200 mL/hr over 30 Minutes Intravenous Every 8 hours 06/02/18 1707 06/03/18 0959   06/02/18 1141  ceFAZolin (ANCEF) 2-4 GM/100ML-% IVPB    Note to Pharmacy:  Phineas Real   : cabinet override      06/02/18 1141 06/02/18 1340   06/02/18 0600  ceFAZolin (ANCEF) IVPB 2g/100 mL premix     2 g 200 mL/hr over 30 Minutes Intravenous On call to O.R. 06/01/18 2215 06/02/18 1350      Assessment/Plan: s/p Procedure(s): AMPUTATION  BELOW KNEE (Right) Knee immobilizer as needed.  Wipe incision with iodine, cover with ABD, Kerlix, Ace wrap.  White count now normal.  Awaiting placement.  On Eliquis   LOS: 2 days   Jemima Petko A [facility] 06/04/2018, 8:34 AM

## 2018-06-05 LAB — GLUCOSE, CAPILLARY
GLUCOSE-CAPILLARY: 123 mg/dL — AB (ref 70–99)
GLUCOSE-CAPILLARY: 159 mg/dL — AB (ref 70–99)
Glucose-Capillary: 120 mg/dL — ABNORMAL HIGH (ref 70–99)
Glucose-Capillary: 180 mg/dL — ABNORMAL HIGH (ref 70–99)

## 2018-06-05 LAB — MAGNESIUM: Magnesium: 1.8 mg/dL (ref 1.7–2.4)

## 2018-06-05 NOTE — Progress Notes (Signed)
Subjective: Interval History: has complaints mild right below-knee amputation incisional pain.  He is most concerned about being discharged, and is anxious to go home tomorrow, if possible.  This is a question he posed several times.  Objective: Vital signs in last 24 hours: Temp:  [97.7 F (36.5 C)-98.7 F (37.1 C)] 97.8 F (36.6 C) (11/03 0800) Pulse Rate:  [64-80] 72 (11/03 0800) Resp:  [18-20] 18 (11/03 0800) BP: (107-122)/(63-66) 122/66 (11/03 0800) SpO2:  [97 %-98 %] 97 % (11/03 0800)  Intake/Output from previous day: 11/02 0701 - 11/03 0700 In: 480 [P.O.:480] Out: 1100 [Urine:1100] Intake/Output this shift: No intake/output data recorded.  General appearance: alert, cooperative and no distress Extremities: Dressing in place right leg, immobilizer available, slight knee flexion currently Incision/Wound: Minimal drainage  Lab Results: Recent Labs    06/03/18 0454 06/04/18 0512  WBC 11.1* 9.9  HGB 9.3* 8.8*  HCT 30.9* 29.1*  PLT 251 261   BMET Recent Labs    06/03/18 0454 06/04/18 0512  NA 137 137  K 3.7 3.7  CL 103 102  CO2 27 28  GLUCOSE 143* 152*  BUN 9 14  CREATININE 0.52* 0.70  CALCIUM 8.5* 8.7*    Studies/Results: No results found. Anti-infectives: Anti-infectives (From admission, onward)   Start     Dose/Rate Route Frequency Ordered Stop   06/02/18 1800  ceFAZolin (ANCEF) IVPB 2g/100 mL premix  Status:  Discontinued     2 g 200 mL/hr over 30 Minutes Intravenous Every 8 hours 06/02/18 1707 06/03/18 0959   06/02/18 1141  ceFAZolin (ANCEF) 2-4 GM/100ML-% IVPB    Note to Pharmacy:  Phineas Real   : cabinet override      06/02/18 1141 06/02/18 1340   06/02/18 0600  ceFAZolin (ANCEF) IVPB 2g/100 mL premix     2 g 200 mL/hr over 30 Minutes Intravenous On call to O.R. 06/01/18 2215 06/02/18 1350      Assessment/Plan: s/p Procedure(s): AMPUTATION BELOW KNEE (Right) At this point in time he has been seen by occupational therapy who recommends  home occupational therapy treatment, same with physical therapy.  He has a knee immobilizer available.  He says he has plenty of help at home, wife stays home.  At this point he is adamant that he wants to go home, although I broached the topic of potentially skilled nursing facility short-term.  His memory is suspect, he has perseverated about whether he is going home or not tomorrow several times during the patient interview.  If he has any doubts he can speak with social work about this.  He is back on his Eliquis.  Likely discharge 24 to 48 hours   LOS: 3 days   Ishaaq Penna A [facility] 06/05/2018, 8:14 AM

## 2018-06-05 NOTE — Progress Notes (Signed)
Physical Therapy Treatment Patient Details Name: [name]. MRN: [id number] DOB: May 01, 1956 Today's Date: 06/05/2018    History of Present Illness Pt is a 62 y.o. 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 Pt. PMHx includes: CVA with memory deficits, DM, and HTN.     PT Comments    Pt is moving along toward DC but per wife pt has not yet climbed any stairs.  Plan is to put pt on stairs and will need to accomplish 4 with only One railing so as not to overlook the accuracy of the task.  Follow up with acute therapy for progression of strengthening, gait tasks and stairs with care to use wife as a resource for all his physical challenges of home.   Follow Up Recommendations  Home health PT;Supervision for mobility/OOB     Equipment Recommendations  Rolling walker with 5" wheels;Wheelchair (measurements PT)    Recommendations for Other Services       Precautions / Restrictions Precautions Precautions: Fall Restrictions Weight Bearing Restrictions: Yes RLE Weight Bearing: Non weight bearing    Mobility  Bed Mobility               General bed mobility comments: up in chair when PT arrived  Transfers Overall transfer level: Needs assistance Equipment used: Rolling walker (2 wheeled);1 person hand held assist Transfers: Sit to/from Stand Sit to Stand: Min guard         General transfer comment: cues for hand placement  Ambulation/Gait Ambulation/Gait assistance: Min assist;Min guard Gait Distance (Feet): 60 Feet(20 x 3) Assistive device: Rolling walker (2 wheeled)   Gait velocity: reduced Gait velocity interpretation: <1.8 ft/sec, indicate of risk for recurrent falls General Gait Details: pt increased control of stand to go heel to toe on LLE with steps   Stairs             Wheelchair Mobility    Modified Rankin (Stroke Patients Only)       Balance Overall balance assessment: Needs assistance Sitting-balance  support: Bilateral upper extremity supported;Single extremity supported Sitting balance-Leahy Scale: Good     Standing balance support: Bilateral upper extremity supported Standing balance-Leahy Scale: Fair Standing balance comment: improved control of initial standing to allow safer transition to grips of walker                             Cognition Arousal/Alertness: Awake/alert Behavior During Therapy: Agitated;Flat affect Overall Cognitive Status: Difficult to assess                                 General Comments: perseverating on his rquest to go home      Exercises      General Comments        Pertinent Vitals/Pain Pain Assessment: Faces Faces Pain Scale: Hurts little more Pain Location: Right  Knee Pain Descriptors / Indicators: Operative site guarding Pain Intervention(s): Monitored during session;Premedicated before session;Repositioned    Home Living                      Prior Function            PT Goals (current goals can now be found in the care plan section) Acute Rehab PT Goals Patient Stated Goal: To regain independence Progress towards PT goals: Progressing toward goals  Frequency    7X/week      PT Plan Current plan remains appropriate    Co-evaluation              AM-PAC PT "6 Clicks" Daily Activity  Outcome Measure  Difficulty turning over in bed (including adjusting bedclothes, sheets and blankets)?: None Difficulty moving from lying on back to sitting on the side of the bed? : A Lot Difficulty sitting down on and standing up from a chair with arms (e.g., wheelchair, bedside commode, etc,.)?: A Little Help needed moving to and from a bed to chair (including a wheelchair)?: A Little Help needed walking in hospital room?: A Little Help needed climbing 3-5 steps with a railing? : A Little 6 Click Score: 18    End of Session Equipment Utilized During Treatment: Gait belt Activity  Tolerance: Patient tolerated treatment well;Patient limited by pain;Patient limited by fatigue Patient left: in chair;with call bell/phone within reach;with chair alarm set;with family/visitor present Nurse Communication: Mobility status PT Visit Diagnosis: Difficulty in walking, not elsewhere classified (R26.2);Pain;Muscle weakness (generalized) (M62.81);Other abnormalities of gait and mobility (R26.89) Pain - Right/Left: Right Pain - part of body: Leg     Time: 9038-3338 PT Time Calculation (min) (ACUTE ONLY): 29 min  Charges:  $Gait Training: 8-22 mins $Therapeutic Activity: 8-22 mins                    Ramond Dial 06/05/2018, 9:44 PM   Mee Hives, PT MS Acute Rehab Dept. Number: [place] [facility] [place]

## 2018-06-06 DIAGNOSIS — I998 Other disorder of circulatory system: Secondary | ICD-10-CM

## 2018-06-06 LAB — SURGICAL PATHOLOGY

## 2018-06-06 LAB — GLUCOSE, CAPILLARY
GLUCOSE-CAPILLARY: 140 mg/dL — AB (ref 70–99)
GLUCOSE-CAPILLARY: 174 mg/dL — AB (ref 70–99)

## 2018-06-06 MED ORDER — OXYCODONE-ACETAMINOPHEN 5-325 MG PO TABS: ORAL_TABLET | ORAL | 0 refills | Status: DC

## 2018-06-06 NOTE — Discharge Summary (Signed)
[name]    Discharge Summary   Patient ID:  Glenn Tarte Perryman Jr. MRN: [id number] DOB/AGE: May 04, 1956 62 y.o.  Admit date: 06/02/2018 Discharge date: 06/06/2018 Date of Surgery: 06/02/2018 Surgeon: Surgeon(s): Dew, Erskine Squibb, MD  Admission Diagnosis: GANGRENE RIGHT LEG  Discharge Diagnoses:  GANGRENE RIGHT LEG  Secondary Diagnoses: Past Medical History:  Diagnosis Date  . Anemia   . Atrial fibrillation ([place])   . Blind    right eye  . Diabetes mellitus with complication ([place])   . Dilated aortic root ([place])    a. 04/2018 Echo: 4.1cm. Asc Ao 3.5cm.  Marland Kitchen Dysrhythmia   . History of echocardiogram    a. 04/2018 Echo: Ef 60-65%, no rwma, midly to mod dil Ao root - 4.1cm. Asc Ao 3.5cm. Mild MR. Nl RV fxn. Nl PASP.  Marland Kitchen History of hernia repair   . History of stress test    a. 2016 MV ([name]): EF 58%, no ischemia.  . Hyperlipidemia   . Hypertension   . Leg pain   . Legally blind   . PAD (peripheral artery disease) ([place])   . PAF (paroxysmal atrial fibrillation) ([name])    a. on Eliquis as of 2018; b. CHADS2VASc => 5 (HTN, DM, stroke x 2, vascular disease)  . Peripheral vascular disease ([place])    a. followed by Dr. Lucky Cowboy; b. s/p kissing balloon stents and right external iliac stent in 10/2017; c. LE angiogrpahy 01/2018: No significant arterial occlusive disease in the lower extremities.  . Pulmonary nodules   . Stroke [facility])    a. 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  On June 02, 2018 the patient underwent a right below the knee amputation.  The patient tolerated the procedure well was transferred to the surgical floor without complication.  The patient's night of surgery  was unremarkable.  During the patient's inpatient stay, he received Physical Therapy and Occupational Therapy who both recommended discharge home with home therapies.  This is not the recommendation of the vascular surgery service as we feel the patient is at very high risk of fall and injury is due to his recent amputation and residual memory deficits from a past CVA.  Upon discharge, the patient was tolerating a regular diet, urinating without complication and pain was controlled with PO pain medication.  Hospital Course:  Glenn Sheaffer. is a 62 y.o. male is S/P Right  Procedure(s): AMPUTATION BELOW KNEE  Extubated: POD # 0  Physical exam:  Alert, NAD CV: Irregularly irregular Pulm: CTA Bilaterally Abdomen: Soft, nontender, nondistended (+) bowel sounds Extremity:   Right lower extremity: Or dressing removed.  Ecchymosis noted along the staple line.  The  staple line is clean dry and intact.  The stump is warm.  Post-op wounds clean, dry, intact or healing well Pt. Is voiding and taking PO diet without difficulty. Pt pain controlled with PO pain meds.  Labs as below  Complications:none  Consults:  Treatment Team:  Vaughan Basta, MD Epifanio Lesches, MD  Significant Diagnostic Studies: CBC Lab Results  Component Value Date   WBC 9.9 06/04/2018   HGB 8.8 (L) 06/04/2018   HCT 29.1 (L) 06/04/2018   MCV 79.3 (L) 06/04/2018   PLT 261 06/04/2018  BMET    Component Value Date/Time   NA 137 06/04/2018 0512   NA 139 05/06/2018 1624   K 3.7 06/04/2018 0512   CL 102 06/04/2018 0512   CO2 28 06/04/2018 0512   GLUCOSE 152 (H) 06/04/2018 0512   BUN 14 06/04/2018 0512   BUN 11 05/06/2018 1624   CREATININE 0.70 06/04/2018 0512   CALCIUM 8.7 (L) 06/04/2018 0512   GFRNONAA >60 06/04/2018 0512   GFRAA >60 06/04/2018 0512   COAG Lab Results  Component Value Date   INR 1.10 06/02/2018   INR 1.32 05/26/2018   INR 1.1 03/31/2018   Disposition:   Discharge to :Home  Allergies as of 06/06/2018      Reactions   Sodium Pentobarbital [pentobarbital] Shortness Of Breath   Lipitor [atorvastatin] Rash      Medication List    STOP taking these medications   enoxaparin 120 MG/0.8ML injection Commonly known as:  LOVENOX   oxyCODONE-acetaminophen 10-325 MG tablet Commonly known as:  PERCOCET Replaced by:  oxyCODONE-acetaminophen 5-325 MG tablet   sulfamethoxazole-trimethoprim 800-160 MG tablet Commonly known as:  BACTRIM DS,SEPTRA DS     TAKE these medications   acetaminophen 500 MG tablet Commonly known as:  TYLENOL Take 1,000 mg by mouth every 6 (six) hours as needed (for pain.).   apixaban 5 MG Tabs tablet Commonly known as:  ELIQUIS Take 1 tablet (5 mg total) by mouth 2 (two) times daily.   aspirin 81 MG EC tablet Take 1 tablet (81 mg total) by mouth daily.   BIOTIN 5000 5 MG Caps Generic drug:  Biotin Take 1 capsule by mouth daily.   cholecalciferol 1000 units tablet Commonly known as:  VITAMIN D Take 1,000 Units by mouth daily.   docusate sodium 100 MG capsule Commonly known as:  COLACE Take 100 mg by mouth 2 (two) times daily as needed for mild constipation.   empagliflozin 10 MG Tabs tablet Commonly known as:  JARDIANCE Take 5 mg by mouth daily.   ezetimibe-simvastatin 10-40 MG tablet Commonly known as:  VYTORIN Take 1 tablet by mouth daily at 6 PM.   ferrous sulfate 325 (65 FE) MG tablet Take 1 tablet (325 mg total) by mouth daily with breakfast.   gabapentin 400 MG capsule Commonly known as:  NEURONTIN TAKE 1 CAPSULE (400 MG TOTAL) BY MOUTH 3 (THREE) TIMES DAILY.   hydrochlorothiazide 12.5 MG capsule Commonly known as:  MICROZIDE Take 1 capsule (12.5 mg total) by mouth daily.   metFORMIN 1000 MG tablet Commonly known as:  GLUCOPHAGE Take 1 tablet (1,000 mg total) by mouth 2 (two) times daily with a meal.   metoprolol tartrate 25 MG tablet Commonly known as:  LOPRESSOR Take 1 tablet (25 mg  total) by mouth 2 (two) times daily.   multivitamin with minerals Tabs tablet Take 1 tablet by mouth daily.   oxyCODONE-acetaminophen 5-325 MG tablet Commonly known as:  PERCOCET/ROXICET One to Two Tabs Every Six Hours As Needed For Pain Replaces:  oxyCODONE-acetaminophen 10-325 MG tablet   vitamin C 500 MG tablet Commonly known as:  ASCORBIC ACID Take 500 mg by mouth daily.      Verbal and written Discharge instructions given to the patient. Wound care per Discharge AVS Follow-up Information    Kris Hartmann, NP Follow up in 1 week(s).   Specialty:  Nurse Practitioner Why:  Incision Check. s/p BKA.  Contact information: [place] [address] [phone]  SignedSela Hua, PA-C  06/06/2018, 2:27 PM

## 2018-06-06 NOTE — Plan of Care (Signed)
  Problem: Education: Goal: Knowledge of General Education information will improve Description Including pain rating scale, medication(s)/side effects and non-pharmacologic comfort measures Outcome: Progressing   Problem: Health Behavior/Discharge Planning: Goal: Ability to manage health-related needs will improve Outcome: Progressing   Problem: Clinical Measurements: Goal: Ability to maintain clinical measurements within normal limits will improve Outcome: Progressing Goal: Will remain free from infection Outcome: Progressing Goal: Diagnostic test results will improve Outcome: Progressing Goal: Respiratory complications will improve Outcome: Progressing Goal: Cardiovascular complication will be avoided Outcome: Progressing   Problem: Nutrition: Goal: Adequate nutrition will be maintained Outcome: Progressing   Problem: Activity: Goal: Risk for activity intolerance will decrease Outcome: Progressing   Problem: Coping: Goal: Level of anxiety will decrease Outcome: Progressing   Problem: Elimination: Goal: Will not experience complications related to bowel motility Outcome: Progressing Goal: Will not experience complications related to urinary retention Outcome: Progressing   Problem: Pain Managment: Goal: General experience of comfort will improve Outcome: Progressing   Problem: Safety: Goal: Ability to remain free from injury will improve Outcome: Progressing   Problem: Skin Integrity: Goal: Risk for impaired skin integrity will decrease Outcome: Progressing   Problem: Education: Goal: Required Educational Video(s) Outcome: Progressing

## 2018-06-06 NOTE — Discharge Instructions (Signed)
Please change your dressing every day.  Please cover your stump with Kerlix followed by a Coban or an Ace bandage. Do not shower until Wednesday 06/08/18.  Please do not bathe or submerge in water until cleared by your physician. Please do not leave your stump covered 24/7.  Why you are not active please leave your stump open to air.

## 2018-06-06 NOTE — Discharge Planning (Signed)
Patient IV removed. RN assessment and VS revealed stability for DC to home with [facility] PT/OT and WC to be provided at [facility] (per patient's) request)Discharge papers given, explained and educated.  Informed of suggested FU appts and appts made.  Printed, signed pain script given.  Wheeled to front and family transported home via car.

## 2018-06-06 NOTE — Care Management (Signed)
Wheelchair ordered from [facility]. [facility] notified of discharge

## 2018-06-06 NOTE — Progress Notes (Signed)
Physical Therapy Treatment Patient Details Name: [name]. MRN: [id number] DOB: 27-Jan-1956 Today's Date: 06/06/2018    History of Present Illness Pt is a 62 y.o. 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 Pt. PMHx includes: CVA with memory deficits, DM, and HTN.     PT Comments    Pt agreeable to PT; denies pain. Pt/spouse feel they will be going home today; no current discharge at this time. Pt has not attempted stair climbing; post discussion to fully understand set up, pt can be driven up next to steps (brick). There are B rails, but only one can be reached at a time. Steps are normal rise (8") according to spouse. There is a porch at the top of the steps. Based on pt ability to hop on floor for ambulation (low clearance, slow and quick fatigue as well as in discussion with pt and spouse, pt will not be able to hop up/down steps. Educated and agreeable to attempting scoot technique and arising from floor to stand with safest technique. Required Mod A of 2 and a 3rd for safety and equipment. Pt needed to arise to a low stool then to the wheelchair; unable to arise from floor to stand. Many cues and assist required for sequencing as well. Of note, pt/spouse state there is no other help they can call on (post lengthy process of problem solving and practicing this process, spouse notes they may be able to get their son to help). Son lives approximately 30 min away and does not have transportation to drive to them, so pt's spouse would have to go pick him up first. Son will not be a reliable person of assist at any given time. Discussed other option of having a ramp installed, which pt/spouse are agreeable to, as it was pointed out that pt will need to be able to get in and out of the house for follow appointments and for safety reasons. Pt does need cues as well for safe ambulation technique and ambulates slowly with fatigue in short distances. Discussed wheelchair need  with SW.  Patient suffers from R BKA which impairs his ability to perform daily activities like safely ambulating for distances over 30 feet, personal grooming/hygiene in the bathroom, managing kitchen activities and managing stair to enter the home. A walker will not resolve the patient's issue with performing activities of daily living. A wheelchair is required/recommended and will allow patient to safely perform daily activities.    Patient can safely propel the wheelchair in the home or has a caregiver who can provide assistance. Continue PT to progress strength and safety with all functional mobility to allow for a safe return home.    Follow Up Recommendations  Home health PT;Supervision for mobility/OOB     Equipment Recommendations  Wheelchair (measurements PT);Other (comment)(pt has walker)    Recommendations for Other Services       Precautions / Restrictions Precautions Precautions: Fall Required Braces or Orthoses: Knee Immobilizer - Right(on in the room, no formal requirements noted in orders) Restrictions Weight Bearing Restrictions: Yes RLE Weight Bearing: Non weight bearing    Mobility  Bed Mobility Overal bed mobility: Needs Assistance Bed Mobility: Supine to Sit     Supine to sit: Supervision     General bed mobility comments: Not tested; up in chair  Transfers Overall transfer level: Needs assistance Equipment used: Rolling walker (2 wheeled) Transfers: Sit to/from Stand Sit to Stand: Min guard  General transfer comment: cues for hand placement; mild slow to process sequence  Ambulation/Gait Ambulation/Gait assistance: Min assist;Min guard Gait Distance (Feet): 10 Feet(several bouts between transfers and stair practice) Assistive device: Rolling walker (2 wheeled) Gait Pattern/deviations: (Hop to)     General Gait Details: Pt requires safety cues for rw placement (not too far or too close). Occasional help with maneuvering with 180 degree  turns   Stairs Stairs: Yes Stairs assistance: Mod assist;+2 physical assistance(addition of +1 (total 3); third for safety/equipment) Stair Management: Seated/boosting Number of Stairs: 4 General stair comments: Difficulty lies in elevating in sequencial heights until able to obtain full stand. Floor to stool to w/c to stand. Heavy verbal and tactile cueing as well as hand over hand assist. Large task    Wheelchair Mobility    Modified Rankin (Stroke Patients Only)       Balance Overall balance assessment: Needs assistance Sitting-balance support: Bilateral upper extremity supported(LLE support) Sitting balance-Leahy Scale: Good     Standing balance support: Bilateral upper extremity supported Standing balance-Leahy Scale: Fair                              Cognition Arousal/Alertness: Awake/alert Behavior During Therapy: WFL for tasks assessed/performed Overall Cognitive Status: History of cognitive impairments - at baseline                                 General Comments: pt with memory deficits at baseline, requires cues for safety/sequencing and able to follow through fairly well      Exercises      General Comments        Pertinent Vitals/Pain Pain Assessment: No/denies pain Pain Score: 5  Pain Location: R residual limb Pain Descriptors / Indicators: Operative site guarding;Aching Pain Intervention(s): Limited activity within patient's tolerance;Monitored during session;Repositioned    Home Living                      Prior Function            PT Goals (current goals can now be found in the care plan section) Acute Rehab PT Goals Patient Stated Goal: To regain independence Progress towards PT goals: Progressing toward goals    Frequency    7X/week      PT Plan Current plan remains appropriate    Co-evaluation              AM-PAC PT "6 Clicks" Daily Activity  Outcome Measure  Difficulty turning  over in bed (including adjusting bedclothes, sheets and blankets)?: None Difficulty moving from lying on back to sitting on the side of the bed? : A Little Difficulty sitting down on and standing up from a chair with arms (e.g., wheelchair, bedside commode, etc,.)?: Unable Help needed moving to and from a bed to chair (including a wheelchair)?: A Little Help needed walking in hospital room?: A Little Help needed climbing 3-5 steps with a railing? : A Lot 6 Click Score: 16    End of Session Equipment Utilized During Treatment: Gait belt Activity Tolerance: Other (comment);Patient limited by fatigue(weakness) Patient left: in chair;with call bell/phone within reach;with chair alarm set;with family/visitor present Nurse Communication: Other (comment)(CM and need for w/c) PT Visit Diagnosis: Difficulty in walking, not elsewhere classified (R26.2);Pain;Muscle weakness (generalized) (M62.81);Other abnormalities of gait and mobility (R26.89) Pain - Right/Left: Right Pain - part  of body: Leg     Time: 3710-6269 PT Time Calculation (min) (ACUTE ONLY): 55 min  Charges:  $Gait Training: 23-37 mins $Therapeutic Activity: 23-37 mins                      Larae Grooms, PTA 06/06/2018, 12:41 PM

## 2018-06-06 NOTE — Progress Notes (Signed)
Occupational Therapy Treatment Patient Details Name: [name]. MRN: [id number] DOB: 1955/10/25 Today's Date: 06/06/2018    History of present illness Pt is a 62 y.o. male who was admitted to [facility] for a  planned BKA on RLE on 06/02/18. Pt. PMHx includes: CVA with memory deficits, DM, and HTN.    OT comments  Pt seen for OT tx this date focused on functional transfer training, toileting, and LB dressing. Pt/spouse instructed in hand/foot placement with RW when performing transfers and functional mobility to improve safety for transfers. Pt required supervision for sup>sit EOB, CGA for STS with RW and cues for hand/foot placement. Pt able to ambulate to bathroom with CGA and occasional VC for sequencing/safety. Pt able to perform toilet transfer to/from regular toilet with CGA-Min assist and to/from [facility] with CGA and VC for hand placement t/o each. Pt instructed in positioning for hygiene following BM, with pt able to stand and perform pericare with R hand with L hand on RW with CGA and set up of toilet paper. 1 VC for thoroughness. Pt performed functional transfer to recliner with CGA and verbal cues for hand placement and demonstrating good follow through. Pt initiated donning of undergarments over RLE w/ KI on requiring min assist. Once in standing, pt able to assist with completing donning over hips with spouse assisting with opposite side. Pt/spouse instructed in 1 handed technique to complete donning more independently while maximizing safety and balance with 1 UE on the RW at all times. Pt very pleased with his progress this date. Will continue to progress towards OT goals. Continue to recommend [place] services upon discharge. Recommend BSC for use over toilet at home to improve safety/independence with toilet transfers.    Follow Up Recommendations  Home health OT    Equipment Recommendations  3 in 1 bedside commode    Recommendations for Other Services      Precautions / Restrictions  Precautions Precautions: Fall Required Braces or Orthoses: Knee Immobilizer - Right(on in the room, no formal requirements noted in orders) Restrictions Weight Bearing Restrictions: Yes RLE Weight Bearing: Non weight bearing       Mobility Bed Mobility Overal bed mobility: Needs Assistance Bed Mobility: Supine to Sit     Supine to sit: Supervision     General bed mobility comments: no difficulty to perform sup>sit EOB  Transfers Overall transfer level: Needs assistance Equipment used: Rolling walker (2 wheeled) Transfers: Sit to/from Stand Sit to Stand: Min guard;From elevated surface         General transfer comment: VC for hand placement    Balance Overall balance assessment: Needs assistance Sitting-balance support: Feet supported;Single extremity supported Sitting balance-Leahy Scale: Good     Standing balance support: Bilateral upper extremity supported Standing balance-Leahy Scale: Fair                             ADL either performed or assessed with clinical judgement   ADL Overall ADL's : Needs assistance/impaired     Grooming: Standing;Min guard;Wash/dry hands;Cueing for safety Grooming Details (indicate cue type and reason): instructed in UE support on sink for support and to lean against sink if needed to stabilize while washing his hands              Lower Body Dressing: Sit to/from stand;Minimal assistance Lower Body Dressing Details (indicate cue type and reason): pt able to don undergarments with min assist to thread over R stump  w/ KI and pt instructed in 1 handed technique to complete donning over hips with other UE on RW for support Toilet Transfer: BSC;Regular Toilet;RW;Ambulation;Cueing for safety;Cueing for sequencing;Min guard;Minimal assistance Toilet Transfer Details (indicate cue type and reason): to regular height toilet, pt required min assist for sit>stand and VC for hand placement; pt able to perform toilet t/f to [facility]  with CGA and VC for hand placement; pt/spouse educated in benefits of and use of BSC to improve safety/independence with toilet transfers at home [place] and Hygiene: Sit to/from stand;Min guard;Set up Two Strike Manipulation Details (indicate cue type and reason): pt instructed in positioning to improve stability while performing hygiene requiring set up and CGA. VC for thoroughness.     Functional mobility during ADLs: Min guard;Rolling walker;Cueing for sequencing(VC particularly when turning)       Vision Baseline Vision/History: Wears glasses Wears Glasses: At all times Patient Visual Report: No change from baseline     Perception     Praxis      Cognition Arousal/Alertness: Awake/alert Behavior During Therapy: WFL for tasks assessed/performed Overall Cognitive Status: History of cognitive impairments - at baseline                                 General Comments: pt with memory deficits at baseline, requires cues for safety/sequencing and able to follow through fairly well        Exercises     Shoulder Instructions       General Comments      Pertinent Vitals/ Pain       Pain Assessment: 0-10 Pain Score: 5  Pain Location: R residual limb Pain Descriptors / Indicators: Operative site guarding;Aching Pain Intervention(s): Limited activity within patient's tolerance;Monitored during session;Repositioned  Home Living                                          Prior Functioning/Environment              Frequency  Min 1X/week        Progress Toward Goals  OT Goals(current goals can now be found in the care plan section)  Progress towards OT goals: Progressing toward goals  Acute Rehab OT Goals Patient Stated Goal: To regain independence OT Goal Formulation: With patient Time For Goal Achievement: 06/17/18 Potential to Achieve Goals: Good  Plan Discharge plan remains  appropriate;Frequency remains appropriate    Co-evaluation                 AM-PAC PT "6 Clicks" Daily Activity     Outcome Measure   Help from another person eating meals?: None Help from another person taking care of personal grooming?: None Help from another person toileting, which includes using toliet, bedpan, or urinal?: A Little Help from another person bathing (including washing, rinsing, drying)?: A Little Help from another person to put on and taking off regular upper body clothing?: None Help from another person to put on and taking off regular lower body clothing?: A Little 6 Click Score: 21    End of Session Equipment Utilized During Treatment: Gait belt;Rolling walker  OT Visit Diagnosis: Pain;Other abnormalities of gait and mobility (R26.89) Pain - Right/Left: Right Pain - part of body: Leg   Activity Tolerance Patient tolerated treatment well   Patient Left  in chair;with call bell/phone within reach;with chair alarm set;with family/visitor present;Other (comment)(KI on RLE and elevated with pillow under RLE)   Nurse Communication          Time: 2574-9355 OT Time Calculation (min): 42 min  Charges: OT General Charges $OT Visit: 1 Visit OT Treatments $Self Care/Home Management : 38-52 mins  Jeni Salles, MPH, MS, OTR/L ascom [phone] 06/06/18, 11:10 AM

## 2018-06-06 NOTE — Care Management Important Message (Signed)
Important Message  Patient Details  Name: [name]. MRN: [id number] Date of Birth: 08/06/1955   Medicare Important Message Given:  Yes    Juliann Pulse A Asheley Hellberg 06/06/2018, 11:25 AM

## 2018-06-07 DIAGNOSIS — I77810 Thoracic aortic ectasia: Secondary | ICD-10-CM | POA: Diagnosis not present

## 2018-06-07 DIAGNOSIS — I10 Essential (primary) hypertension: Secondary | ICD-10-CM | POA: Diagnosis not present

## 2018-06-07 DIAGNOSIS — E785 Hyperlipidemia, unspecified: Secondary | ICD-10-CM | POA: Diagnosis not present

## 2018-06-07 DIAGNOSIS — D649 Anemia, unspecified: Secondary | ICD-10-CM | POA: Diagnosis not present

## 2018-06-07 DIAGNOSIS — R911 Solitary pulmonary nodule: Secondary | ICD-10-CM | POA: Diagnosis not present

## 2018-06-07 DIAGNOSIS — I69351 Hemiplegia and hemiparesis following cerebral infarction affecting right dominant side: Secondary | ICD-10-CM | POA: Diagnosis not present

## 2018-06-07 DIAGNOSIS — Z4781 Encounter for orthopedic aftercare following surgical amputation: Secondary | ICD-10-CM | POA: Diagnosis not present

## 2018-06-07 DIAGNOSIS — H5461 Unqualified visual loss, right eye, normal vision left eye: Secondary | ICD-10-CM | POA: Diagnosis not present

## 2018-06-07 DIAGNOSIS — Z89511 Acquired absence of right leg below knee: Secondary | ICD-10-CM | POA: Diagnosis not present

## 2018-06-07 DIAGNOSIS — E1152 Type 2 diabetes mellitus with diabetic peripheral angiopathy with gangrene: Secondary | ICD-10-CM | POA: Diagnosis not present

## 2018-06-07 DIAGNOSIS — I480 Paroxysmal atrial fibrillation: Secondary | ICD-10-CM | POA: Diagnosis not present

## 2018-06-09 ENCOUNTER — Ambulatory Visit (INDEPENDENT_AMBULATORY_CARE_PROVIDER_SITE_OTHER): Payer: Medicare HMO | Admitting: Family Medicine

## 2018-06-09 ENCOUNTER — Encounter: Payer: Self-pay | Admitting: Family Medicine

## 2018-06-09 VITALS — BP 124/74 | HR 77 | Temp 97.9°F

## 2018-06-09 DIAGNOSIS — R911 Solitary pulmonary nodule: Secondary | ICD-10-CM | POA: Diagnosis not present

## 2018-06-09 DIAGNOSIS — Z89511 Acquired absence of right leg below knee: Secondary | ICD-10-CM | POA: Diagnosis not present

## 2018-06-09 DIAGNOSIS — D500 Iron deficiency anemia secondary to blood loss (chronic): Secondary | ICD-10-CM

## 2018-06-09 DIAGNOSIS — I10 Essential (primary) hypertension: Secondary | ICD-10-CM | POA: Diagnosis not present

## 2018-06-09 DIAGNOSIS — F4321 Adjustment disorder with depressed mood: Secondary | ICD-10-CM

## 2018-06-09 DIAGNOSIS — R69 Illness, unspecified: Secondary | ICD-10-CM | POA: Diagnosis not present

## 2018-06-09 DIAGNOSIS — E1152 Type 2 diabetes mellitus with diabetic peripheral angiopathy with gangrene: Secondary | ICD-10-CM | POA: Diagnosis not present

## 2018-06-09 DIAGNOSIS — I77810 Thoracic aortic ectasia: Secondary | ICD-10-CM | POA: Diagnosis not present

## 2018-06-09 DIAGNOSIS — I96 Gangrene, not elsewhere classified: Secondary | ICD-10-CM | POA: Diagnosis not present

## 2018-06-09 DIAGNOSIS — Z4781 Encounter for orthopedic aftercare following surgical amputation: Secondary | ICD-10-CM | POA: Diagnosis not present

## 2018-06-09 DIAGNOSIS — H5461 Unqualified visual loss, right eye, normal vision left eye: Secondary | ICD-10-CM | POA: Diagnosis not present

## 2018-06-09 DIAGNOSIS — Z89512 Acquired absence of left leg below knee: Secondary | ICD-10-CM

## 2018-06-09 DIAGNOSIS — I480 Paroxysmal atrial fibrillation: Secondary | ICD-10-CM | POA: Diagnosis not present

## 2018-06-09 DIAGNOSIS — D649 Anemia, unspecified: Secondary | ICD-10-CM | POA: Diagnosis not present

## 2018-06-09 DIAGNOSIS — I69351 Hemiplegia and hemiparesis following cerebral infarction affecting right dominant side: Secondary | ICD-10-CM | POA: Diagnosis not present

## 2018-06-09 NOTE — Progress Notes (Signed)
BP 124/74 (BP Location: Left Arm, Patient Position: Sitting, Cuff Size: Normal)   Pulse 77   Temp 97.9 F (36.6 C) (Oral)   SpO2 100%    Subjective:    Patient ID: [name]., male    DOB: 1956/06/06, 62 y.o.   MRN: [id number]  HPI: [name]. is a 62 y.o. male  Chief Complaint  Patient presents with  . Depression   Elius presents today very upset. He had told his wife that he wasn't feeling like himself and wanted to start an antidepressant. He changed his mind on his way here and is very upset that he is here and doesn't want an antidepressant. He is still very anemic- had been getting worse and is hesitant to go get his iron infusion. He is doing better getting around with his walker. He is otherwise doing OK. No other concerns or complaints at this time.   Relevant past medical, surgical, family and social history reviewed and updated as indicated. Interim medical history since our last visit reviewed. Allergies and medications reviewed and updated.  Review of Systems  Constitutional: Positive for fatigue. Negative for activity change, appetite change, chills, diaphoresis, fever and unexpected weight change.  Respiratory: Negative.   Cardiovascular: Negative.   Musculoskeletal: Negative.   Skin: Positive for pallor. Negative for color change, rash and wound.  Neurological: Negative.   Psychiatric/Behavioral: Positive for dysphoric mood. Negative for agitation, behavioral problems, confusion, decreased concentration, hallucinations, self-injury, sleep disturbance and suicidal ideas. The patient is not nervous/anxious and is not hyperactive.     Per HPI unless specifically indicated above     Objective:    BP 124/74 (BP Location: Left Arm, Patient Position: Sitting, Cuff Size: Normal)   Pulse 77   Temp 97.9 F (36.6 C) (Oral)   SpO2 100%   Wt Readings from Last 3 Encounters:  05/26/18 188 lb (85.3 kg)  05/23/18 189 lb (85.7 kg)  05/16/18 186 lb 8 oz  (84.6 kg)    Physical Exam  Constitutional: He is oriented to person, place, and time. He appears well-developed and well-nourished. No distress.  HENT:  Head: Normocephalic and atraumatic.  Right Ear: Hearing normal.  Left Ear: Hearing normal.  Nose: Nose normal.  Eyes: Conjunctivae and lids are normal. Right eye exhibits no discharge. Left eye exhibits no discharge. No scleral icterus.  Cardiovascular: Normal rate, regular rhythm, normal heart sounds and intact distal pulses. Exam reveals no gallop and no friction rub.  No murmur heard. Pulmonary/Chest: Effort normal and breath sounds normal. No stridor. No respiratory distress. He has no wheezes. He has no rales. He exhibits no tenderness.  Musculoskeletal: Normal range of motion.  R BKA- healing well    Neurological: He is alert and oriented to person, place, and time.  Skin: Skin is warm, dry and intact. Capillary refill takes less than 2 seconds. No rash noted. He is not diaphoretic. No erythema. There is pallor.  Psychiatric: He has a normal mood and affect. His speech is normal and behavior is normal. Judgment and thought content normal. Cognition and memory are normal.  Nursing note and vitals reviewed.   Results for orders placed or performed during the hospital encounter of 06/02/18  Protime-INR  Result Value Ref Range   Prothrombin Time 14.1 11.4 - 15.2 seconds   INR 1.10   Glucose, capillary  Result Value Ref Range   Glucose-Capillary 152 (H) 70 - 99 mg/dL  Glucose, capillary  Result Value Ref  Range   Glucose-Capillary 148 (H) 70 - 99 mg/dL  CBC  Result Value Ref Range   WBC 12.3 (H) 4.0 - 10.5 K/uL   RBC 4.07 (L) 4.22 - 5.81 MIL/uL   Hemoglobin 9.5 (L) 13.0 - 17.0 g/dL   HCT 31.9 (L) 39.0 - 52.0 %   MCV 78.4 (L) 80.0 - 100.0 fL   MCH 23.3 (L) 26.0 - 34.0 pg   MCHC 29.8 (L) 30.0 - 36.0 g/dL   RDW 17.3 (H) 11.5 - 15.5 %   Platelets 250 150 - 400 K/uL   nRBC 0.0 0.0 - 0.2 %  Creatinine, serum  Result Value Ref  Range   Creatinine, Ser 0.67 0.61 - 1.24 mg/dL   GFR calc non Af Amer >60 >60 mL/min   GFR calc Af Amer >60 >60 mL/min  Basic metabolic panel  Result Value Ref Range   Sodium 137 135 - 145 mmol/L   Potassium 3.7 3.5 - 5.1 mmol/L   Chloride 103 98 - 111 mmol/L   CO2 27 22 - 32 mmol/L   Glucose, Bld 143 (H) 70 - 99 mg/dL   BUN 9 8 - 23 mg/dL   Creatinine, Ser 0.52 (L) 0.61 - 1.24 mg/dL   Calcium 8.5 (L) 8.9 - 10.3 mg/dL   GFR calc non Af Amer >60 >60 mL/min   GFR calc Af Amer >60 >60 mL/min   Anion gap 7 5 - 15  CBC  Result Value Ref Range   WBC 11.1 (H) 4.0 - 10.5 K/uL   RBC 3.90 (L) 4.22 - 5.81 MIL/uL   Hemoglobin 9.3 (L) 13.0 - 17.0 g/dL   HCT 30.9 (L) 39.0 - 52.0 %   MCV 79.2 (L) 80.0 - 100.0 fL   MCH 23.8 (L) 26.0 - 34.0 pg   MCHC 30.1 30.0 - 36.0 g/dL   RDW 17.7 (H) 11.5 - 15.5 %   Platelets 251 150 - 400 K/uL   nRBC 0.0 0.0 - 0.2 %  Glucose, capillary  Result Value Ref Range   Glucose-Capillary 172 (H) 70 - 99 mg/dL   Comment 1 Notify RN   Glucose, capillary  Result Value Ref Range   Glucose-Capillary 138 (H) 70 - 99 mg/dL  Glucose, capillary  Result Value Ref Range   Glucose-Capillary 137 (H) 70 - 99 mg/dL  Glucose, capillary  Result Value Ref Range   Glucose-Capillary 174 (H) 70 - 99 mg/dL  Basic metabolic panel  Result Value Ref Range   Sodium 137 135 - 145 mmol/L   Potassium 3.7 3.5 - 5.1 mmol/L   Chloride 102 98 - 111 mmol/L   CO2 28 22 - 32 mmol/L   Glucose, Bld 152 (H) 70 - 99 mg/dL   BUN 14 8 - 23 mg/dL   Creatinine, Ser 0.70 0.61 - 1.24 mg/dL   Calcium 8.7 (L) 8.9 - 10.3 mg/dL   GFR calc non Af Amer >60 >60 mL/min   GFR calc Af Amer >60 >60 mL/min   Anion gap 7 5 - 15  CBC  Result Value Ref Range   WBC 9.9 4.0 - 10.5 K/uL   RBC 3.67 (L) 4.22 - 5.81 MIL/uL   Hemoglobin 8.8 (L) 13.0 - 17.0 g/dL   HCT 29.1 (L) 39.0 - 52.0 %   MCV 79.3 (L) 80.0 - 100.0 fL   MCH 24.0 (L) 26.0 - 34.0 pg   MCHC 30.2 30.0 - 36.0 g/dL   RDW 17.9 (H) 11.5 - 15.5 %  Platelets 261 150 - 400 K/uL   nRBC 0.0 0.0 - 0.2 %  Magnesium  Result Value Ref Range   Magnesium 1.9 1.7 - 2.4 mg/dL  Glucose, capillary  Result Value Ref Range   Glucose-Capillary 109 (H) 70 - 99 mg/dL   Comment 1 Notify RN   Glucose, capillary  Result Value Ref Range   Glucose-Capillary 157 (H) 70 - 99 mg/dL  Glucose, capillary  Result Value Ref Range   Glucose-Capillary 151 (H) 70 - 99 mg/dL  Glucose, capillary  Result Value Ref Range   Glucose-Capillary 180 (H) 70 - 99 mg/dL  Magnesium  Result Value Ref Range   Magnesium 1.8 1.7 - 2.4 mg/dL  Glucose, capillary  Result Value Ref Range   Glucose-Capillary 118 (H) 70 - 99 mg/dL   Comment 1 Notify RN   Glucose, capillary  Result Value Ref Range   Glucose-Capillary 123 (H) 70 - 99 mg/dL   Comment 1 Notify RN   Glucose, capillary  Result Value Ref Range   Glucose-Capillary 120 (H) 70 - 99 mg/dL   Comment 1 Notify RN   Glucose, capillary  Result Value Ref Range   Glucose-Capillary 180 (H) 70 - 99 mg/dL   Comment 1 Notify RN   Glucose, capillary  Result Value Ref Range   Glucose-Capillary 159 (H) 70 - 99 mg/dL  Glucose, capillary  Result Value Ref Range   Glucose-Capillary 140 (H) 70 - 99 mg/dL  Glucose, capillary  Result Value Ref Range   Glucose-Capillary 174 (H) 70 - 99 mg/dL  ABO/Rh  Result Value Ref Range   ABO/RH(D)      B POS Performed at [facility], [address] [place], [place] [address]   Surgical pathology  Result Value Ref Range   SURGICAL PATHOLOGY      Surgical Pathology CASE: [id number] PATIENT: Akram Ord Surgical Pathology Report     SPECIMEN SUBMITTED: A. Knee, right below; amputation  CLINICAL HISTORY: None provided  PRE-OPERATIVE DIAGNOSIS: Gangrene right leg  POST-OPERATIVE DIAGNOSIS: Same as pre-op     DIAGNOSIS: A.  KNEE, RIGHT BELOW; AMPUTATION: - BELOW-KNEE AMPUTATED RIGHT LEG SHOWING ARTERIOSCLEROSIS, GANGRENE AND ULCERATION. - GANGRENE  IS PRESENT AT THE PROXIMAL SKIN MARGIN. - THE BONE MARGIN IS VIABLE.  GROSS DESCRIPTION: A. Labeled: Right below the knee amputation Received: Fresh Size: 38 cm long up to 12.5 cm in diameter with a 25 cm long foot with 5 digits Description of lesion(s): All 5 digits are discolored black to yellow and there are multiple ulcerations, the closest 1 cm to the proximal margin Proximal margin: Ulceration within 1 cm proximal margin Bone: There is a 2 cm long exposed tibia and 2.1 cm long exposed fibula Other findings: Sectioning the vessels they are  patent however appear small in caliber  Block summary: 1 - perpendicular skin margin, en face bone and vessel at margin 2 - representative bone/bone marrow at margin 3 - representative skin discoloration on digits 4 - representative skin discoloration and underlying bone 5 - representative cross-section anterior tibial vessels 6 - representative cross-section posterior tibial vessels  Tissue decalcification: 2,4   Final Diagnosis performed by Galvin Proffer, MD.   Electronically signed 06/06/2018 11:26:29AM The electronic signature indicates that the named Attending Pathologist has evaluated the specimen  Technical component performed at [facility], [address], [place], [place] [address] Lab: [phone] Dir: Rush Farmer, MD, MMM  Professional component performed at [facility],  [facility], [place], [place], [place] [address] Lab:  [phone] Dir: Dellia Nims. Reuel Derby, MD       Assessment & Plan:   Problem List Items Addressed This Visit      Other   Iron deficiency anemia    Long discussion today about patient going to hematology for his infusions. Discussed that we cannot get him better with oral pills, and that this should get him feeling better. Continue to monitor.       S/P bilateral BKA (below knee amputation) ([place])    Healing well. Continue to follow with vascular. Call with any concerns.         Other Visit Diagnoses    Adjustment disorder with depressed mood    -  Primary   Likely due to recent surgery and profound anemia. Get infusions. Call Recheck 2 months. Call with any concerns.        Follow up plan: Return in about 2 months (around 08/09/2018).   Greater than 50% of a 25 minute visit spent in counseling and coordination of care

## 2018-06-11 ENCOUNTER — Encounter: Payer: Self-pay | Admitting: Family Medicine

## 2018-06-11 DIAGNOSIS — Z89512 Acquired absence of left leg below knee: Secondary | ICD-10-CM

## 2018-06-11 DIAGNOSIS — Z89511 Acquired absence of right leg below knee: Secondary | ICD-10-CM

## 2018-06-11 HISTORY — DX: Acquired absence of left leg below knee: Z89.512

## 2018-06-11 HISTORY — DX: Acquired absence of left leg below knee: Z89.511

## 2018-06-11 NOTE — Assessment & Plan Note (Signed)
Long discussion today about patient going to hematology for his infusions. Discussed that we cannot get him better with oral pills, and that this should get him feeling better. Continue to monitor.

## 2018-06-11 NOTE — Assessment & Plan Note (Signed)
Healing well. Continue to follow with vascular. Call with any concerns.

## 2018-06-13 ENCOUNTER — Inpatient Hospital Stay: Payer: Medicare HMO | Attending: Oncology

## 2018-06-13 ENCOUNTER — Other Ambulatory Visit: Payer: Self-pay

## 2018-06-13 ENCOUNTER — Encounter (INDEPENDENT_AMBULATORY_CARE_PROVIDER_SITE_OTHER): Payer: Self-pay | Admitting: Nurse Practitioner

## 2018-06-13 ENCOUNTER — Ambulatory Visit (INDEPENDENT_AMBULATORY_CARE_PROVIDER_SITE_OTHER): Payer: Medicare HMO | Admitting: Nurse Practitioner

## 2018-06-13 VITALS — BP 104/71 | HR 103 | Resp 17

## 2018-06-13 DIAGNOSIS — E119 Type 2 diabetes mellitus without complications: Secondary | ICD-10-CM | POA: Insufficient documentation

## 2018-06-13 DIAGNOSIS — E785 Hyperlipidemia, unspecified: Secondary | ICD-10-CM | POA: Insufficient documentation

## 2018-06-13 DIAGNOSIS — R911 Solitary pulmonary nodule: Secondary | ICD-10-CM | POA: Diagnosis not present

## 2018-06-13 DIAGNOSIS — Z79899 Other long term (current) drug therapy: Secondary | ICD-10-CM | POA: Diagnosis not present

## 2018-06-13 DIAGNOSIS — Z7982 Long term (current) use of aspirin: Secondary | ICD-10-CM | POA: Insufficient documentation

## 2018-06-13 DIAGNOSIS — R531 Weakness: Secondary | ICD-10-CM | POA: Insufficient documentation

## 2018-06-13 DIAGNOSIS — Z87891 Personal history of nicotine dependence: Secondary | ICD-10-CM | POA: Diagnosis not present

## 2018-06-13 DIAGNOSIS — R5383 Other fatigue: Secondary | ICD-10-CM | POA: Diagnosis not present

## 2018-06-13 DIAGNOSIS — I10 Essential (primary) hypertension: Secondary | ICD-10-CM | POA: Insufficient documentation

## 2018-06-13 DIAGNOSIS — Z7901 Long term (current) use of anticoagulants: Secondary | ICD-10-CM | POA: Insufficient documentation

## 2018-06-13 DIAGNOSIS — I739 Peripheral vascular disease, unspecified: Secondary | ICD-10-CM | POA: Insufficient documentation

## 2018-06-13 DIAGNOSIS — F17211 Nicotine dependence, cigarettes, in remission: Secondary | ICD-10-CM

## 2018-06-13 DIAGNOSIS — D509 Iron deficiency anemia, unspecified: Secondary | ICD-10-CM | POA: Insufficient documentation

## 2018-06-13 DIAGNOSIS — I4821 Permanent atrial fibrillation: Secondary | ICD-10-CM | POA: Diagnosis not present

## 2018-06-13 DIAGNOSIS — D649 Anemia, unspecified: Secondary | ICD-10-CM | POA: Diagnosis not present

## 2018-06-13 DIAGNOSIS — Z89422 Acquired absence of other left toe(s): Secondary | ICD-10-CM | POA: Insufficient documentation

## 2018-06-13 DIAGNOSIS — K922 Gastrointestinal hemorrhage, unspecified: Secondary | ICD-10-CM | POA: Diagnosis not present

## 2018-06-13 DIAGNOSIS — Z7984 Long term (current) use of oral hypoglycemic drugs: Secondary | ICD-10-CM | POA: Diagnosis not present

## 2018-06-13 DIAGNOSIS — E782 Mixed hyperlipidemia: Secondary | ICD-10-CM

## 2018-06-13 DIAGNOSIS — Z89511 Acquired absence of right leg below knee: Secondary | ICD-10-CM

## 2018-06-13 DIAGNOSIS — I4891 Unspecified atrial fibrillation: Secondary | ICD-10-CM | POA: Insufficient documentation

## 2018-06-13 DIAGNOSIS — R0602 Shortness of breath: Secondary | ICD-10-CM | POA: Diagnosis not present

## 2018-06-13 DIAGNOSIS — S88911A Complete traumatic amputation of right lower leg, level unspecified, initial encounter: Secondary | ICD-10-CM

## 2018-06-13 DIAGNOSIS — Z8673 Personal history of transient ischemic attack (TIA), and cerebral infarction without residual deficits: Secondary | ICD-10-CM | POA: Diagnosis not present

## 2018-06-13 LAB — CBC WITH DIFFERENTIAL/PLATELET
Abs Immature Granulocytes: 0.07 10*3/uL (ref 0.00–0.07)
Basophils Absolute: 0 10*3/uL (ref 0.0–0.1)
Basophils Relative: 0 %
EOS ABS: 0.2 10*3/uL (ref 0.0–0.5)
Eosinophils Relative: 2 %
HEMATOCRIT: 32.9 % — AB (ref 39.0–52.0)
Hemoglobin: 10 g/dL — ABNORMAL LOW (ref 13.0–17.0)
IMMATURE GRANULOCYTES: 1 %
LYMPHS ABS: 1.8 10*3/uL (ref 0.7–4.0)
Lymphocytes Relative: 14 %
MCH: 24.2 pg — AB (ref 26.0–34.0)
MCHC: 30.4 g/dL (ref 30.0–36.0)
MCV: 79.5 fL — AB (ref 80.0–100.0)
MONO ABS: 0.7 10*3/uL (ref 0.1–1.0)
MONOS PCT: 5 %
NEUTROS PCT: 78 %
Neutro Abs: 10.2 10*3/uL — ABNORMAL HIGH (ref 1.7–7.7)
Platelets: 265 10*3/uL (ref 150–400)
RBC: 4.14 MIL/uL — ABNORMAL LOW (ref 4.22–5.81)
RDW: 20.2 % — ABNORMAL HIGH (ref 11.5–15.5)
WBC: 13 10*3/uL — ABNORMAL HIGH (ref 4.0–10.5)
nRBC: 0 % (ref 0.0–0.2)

## 2018-06-13 LAB — IRON AND TIBC
IRON: 29 ug/dL — AB (ref 45–182)
SATURATION RATIOS: 7 % — AB (ref 17.9–39.5)
TIBC: 392 ug/dL (ref 250–450)
UIBC: 363 ug/dL

## 2018-06-13 LAB — FERRITIN: FERRITIN: 40 ng/mL (ref 24–336)

## 2018-06-13 MED ORDER — OXYCODONE-ACETAMINOPHEN 5-325 MG PO TABS: 1.0000 | ORAL_TABLET | ORAL | 0 refills | Status: AC | PRN

## 2018-06-14 ENCOUNTER — Telehealth (INDEPENDENT_AMBULATORY_CARE_PROVIDER_SITE_OTHER): Payer: Self-pay

## 2018-06-14 DIAGNOSIS — E1152 Type 2 diabetes mellitus with diabetic peripheral angiopathy with gangrene: Secondary | ICD-10-CM | POA: Diagnosis not present

## 2018-06-14 DIAGNOSIS — I10 Essential (primary) hypertension: Secondary | ICD-10-CM | POA: Diagnosis not present

## 2018-06-14 DIAGNOSIS — Z4781 Encounter for orthopedic aftercare following surgical amputation: Secondary | ICD-10-CM | POA: Diagnosis not present

## 2018-06-14 DIAGNOSIS — I77810 Thoracic aortic ectasia: Secondary | ICD-10-CM | POA: Diagnosis not present

## 2018-06-14 DIAGNOSIS — H5461 Unqualified visual loss, right eye, normal vision left eye: Secondary | ICD-10-CM | POA: Diagnosis not present

## 2018-06-14 DIAGNOSIS — I69351 Hemiplegia and hemiparesis following cerebral infarction affecting right dominant side: Secondary | ICD-10-CM | POA: Diagnosis not present

## 2018-06-14 DIAGNOSIS — I480 Paroxysmal atrial fibrillation: Secondary | ICD-10-CM | POA: Diagnosis not present

## 2018-06-14 DIAGNOSIS — R911 Solitary pulmonary nodule: Secondary | ICD-10-CM | POA: Diagnosis not present

## 2018-06-14 DIAGNOSIS — D649 Anemia, unspecified: Secondary | ICD-10-CM | POA: Diagnosis not present

## 2018-06-14 DIAGNOSIS — Z89511 Acquired absence of right leg below knee: Secondary | ICD-10-CM | POA: Diagnosis not present

## 2018-06-14 NOTE — Telephone Encounter (Signed)
Called the OT back to let her know that 06/20/18 is okay for the patient's evaluation.

## 2018-06-14 NOTE — Telephone Encounter (Signed)
Occupational Health nurse called to see if it's okay for her to move the patient's OT evaluation back to 11/18, per the patient's request. If so, she will call the patient after hearing from [place] to let him know that she will be out next Monday to see him.

## 2018-06-14 NOTE — Telephone Encounter (Signed)
That is fine if that is what the patient requested. Thanks

## 2018-06-15 ENCOUNTER — Inpatient Hospital Stay (HOSPITAL_BASED_OUTPATIENT_CLINIC_OR_DEPARTMENT_OTHER): Payer: Medicare HMO | Admitting: Oncology

## 2018-06-15 ENCOUNTER — Inpatient Hospital Stay: Payer: Medicare HMO

## 2018-06-15 ENCOUNTER — Other Ambulatory Visit: Payer: Self-pay

## 2018-06-15 ENCOUNTER — Encounter: Payer: Self-pay | Admitting: Oncology

## 2018-06-15 VITALS — BP 103/65 | HR 95 | Temp 98.0°F

## 2018-06-15 VITALS — BP 98/64 | HR 100 | Temp 96.8°F | Resp 20

## 2018-06-15 DIAGNOSIS — R531 Weakness: Secondary | ICD-10-CM

## 2018-06-15 DIAGNOSIS — I4891 Unspecified atrial fibrillation: Secondary | ICD-10-CM

## 2018-06-15 DIAGNOSIS — R911 Solitary pulmonary nodule: Secondary | ICD-10-CM

## 2018-06-15 DIAGNOSIS — D509 Iron deficiency anemia, unspecified: Secondary | ICD-10-CM

## 2018-06-15 DIAGNOSIS — R5383 Other fatigue: Secondary | ICD-10-CM

## 2018-06-15 DIAGNOSIS — Z7901 Long term (current) use of anticoagulants: Secondary | ICD-10-CM

## 2018-06-15 DIAGNOSIS — Z79899 Other long term (current) drug therapy: Secondary | ICD-10-CM

## 2018-06-15 DIAGNOSIS — Z89422 Acquired absence of other left toe(s): Secondary | ICD-10-CM

## 2018-06-15 DIAGNOSIS — I4821 Permanent atrial fibrillation: Secondary | ICD-10-CM

## 2018-06-15 DIAGNOSIS — Z7984 Long term (current) use of oral hypoglycemic drugs: Secondary | ICD-10-CM

## 2018-06-15 DIAGNOSIS — R0602 Shortness of breath: Secondary | ICD-10-CM | POA: Diagnosis not present

## 2018-06-15 DIAGNOSIS — Z8673 Personal history of transient ischemic attack (TIA), and cerebral infarction without residual deficits: Secondary | ICD-10-CM

## 2018-06-15 DIAGNOSIS — E119 Type 2 diabetes mellitus without complications: Secondary | ICD-10-CM | POA: Diagnosis not present

## 2018-06-15 DIAGNOSIS — I10 Essential (primary) hypertension: Secondary | ICD-10-CM

## 2018-06-15 DIAGNOSIS — I739 Peripheral vascular disease, unspecified: Secondary | ICD-10-CM | POA: Diagnosis not present

## 2018-06-15 DIAGNOSIS — K922 Gastrointestinal hemorrhage, unspecified: Secondary | ICD-10-CM | POA: Diagnosis not present

## 2018-06-15 DIAGNOSIS — Z7982 Long term (current) use of aspirin: Secondary | ICD-10-CM

## 2018-06-15 DIAGNOSIS — Z87891 Personal history of nicotine dependence: Secondary | ICD-10-CM

## 2018-06-15 DIAGNOSIS — E785 Hyperlipidemia, unspecified: Secondary | ICD-10-CM

## 2018-06-15 DIAGNOSIS — D649 Anemia, unspecified: Secondary | ICD-10-CM

## 2018-06-15 MED ORDER — SODIUM CHLORIDE 0.9 % IV SOLN
510.0000 mg | Freq: Once | INTRAVENOUS | Status: AC
  Administered 2018-06-15: 510 mg via INTRAVENOUS
  Filled 2018-06-15: qty 17

## 2018-06-15 MED ORDER — SODIUM CHLORIDE 0.9 % IV SOLN
Freq: Once | INTRAVENOUS | Status: AC
  Administered 2018-06-15: 14:00:00 via INTRAVENOUS
  Filled 2018-06-15: qty 250

## 2018-06-15 NOTE — Progress Notes (Signed)
Patient here today for follow up and iron infusion.  Patient states no new concerns today  

## 2018-06-15 NOTE — Progress Notes (Signed)
Hematology/Oncology Consult note [facility] [phone] [phone]   Patient Care Team: [name], DO as PCP - General (Family Medicine) Rockey Situ Kathlene November, MD as PCP - Cardiology (Cardiology) [name], DO as Referring Physician (Family Medicine) Yolonda Kida, MD as Consulting Physician (Cardiology) Vladimir Crofts, MD (Neurology) Lucky Cowboy Erskine Squibb, MD as Referring Physician (Vascular Surgery)  REFERRING PROVIDER: [name], DO CHIEF COMPLAINTS/REASON FOR VISIT:  Evaluation of anemia  HISTORY OF PRESENTING ILLNESS:  [name]. is a  62 y.o.  male with PMH listed below who was referred to me for evaluation of anemia Reviewed patient's recent labs that was done at [facility] office. Labs revealed anemia with hemoglobin of 11 .   Reviewed patient's previous labs ordered by primary care physician's office, anemia onset was since July 2019 04/07/2018, iron panel showed ferritin 13, iron saturation 28, TIBC 500. He takes oral iron supplementation. 05/06/2018, iron panel showed ferritin 19, iron saturation 4.  TIBC 389. Patient was scheduled for colonoscopy on 05/12/2018 for evaluation of GI blood loss. Patient is on chronic anticoagulation Eliquis for atrial fibrillation and recurrent stroke.  Also on Plavix.  For colonoscopy, he was advised to hold oral iron supplements 5 days prior to the colonoscopy.  Patient has severe peripheral vascular disease with complications including lower extremity ulceration and necrotic toes.  Amputation was held due to acute GI bleed.  Patient reports severe pain for which he takes Neurontin 400 mg 3 times a day and the Percocet 10-325 mg 1 tablet every 6 hours as needed for pain.  Patient reports pain is at a level 5 out of 10.  Appears uncomfortable, alternating between sitting and standing during assessment due to lower extremity discomfort.  He reports that pain medication has relief his pain  sometimes.  Associated signs and symptoms: Patient reports fatigue.  Shortness of breath with exertion.   INTERVAL HISTORY [name]. is a 62 y.o. male who has above history reviewed by me today presents for follow up visit for management of iron deficiency anemia.  During the interval, he underwent right LE amputation.   Problems and complaints are listed below:  Review of Systems  Constitutional: Positive for malaise/fatigue. Negative for chills, fever and weight loss.  HENT: Negative for nosebleeds and sore throat.   Eyes: Negative for double vision, photophobia and redness.  Respiratory: Negative for cough, shortness of breath and wheezing.   Cardiovascular: Negative for chest pain, palpitations and orthopnea.  Gastrointestinal: Negative for abdominal pain, blood in stool, nausea and vomiting.  Genitourinary: Negative for dysuria.  Musculoskeletal: Negative for back pain, myalgias and neck pain.       Right lower extremity post surgery pain.  Skin: Negative for itching and rash.  Neurological: Negative for dizziness, tingling and tremors.  Endo/Heme/Allergies: Negative for environmental allergies. Does not bruise/bleed easily.  Psychiatric/Behavioral: Negative for depression.    MEDICAL HISTORY:  Past Medical History:  Diagnosis Date  . Anemia   . Atrial fibrillation ([place])   . Blind    right eye  . Diabetes mellitus with complication ([place])   . Dilated aortic root ([place])    a. 04/2018 Echo: 4.1cm. Asc Ao 3.5cm.  Marland Kitchen Dysrhythmia   . History of echocardiogram    a. 04/2018 Echo: Ef 60-65%, no rwma, midly to mod dil Ao root - 4.1cm. Asc Ao 3.5cm. Mild MR. Nl RV fxn. Nl PASP.  Marland Kitchen History of hernia repair   .  History of stress test    a. 2016 MV ([name]): EF 58%, no ischemia.  . Hyperlipidemia   . Hypertension   . Leg pain   . Legally blind   . PAD (peripheral artery disease) ([place])   . PAF (paroxysmal atrial fibrillation) ([name])    a. on Eliquis as of 2018; b.  CHADS2VASc => 5 (HTN, DM, stroke x 2, vascular disease)  . Peripheral vascular disease ([place])    a. followed by Dr. Lucky Cowboy; b. s/p kissing balloon stents and right external iliac stent in 10/2017; c. LE angiogrpahy 01/2018: No significant arterial occlusive disease in the lower extremities.  . Pulmonary nodules   . Stroke [facility])    a. 2016 & 2018    SURGICAL HISTORY: Past Surgical History:  Procedure Laterality Date  . AMPUTATION Right 06/02/2018   Procedure: AMPUTATION BELOW KNEE;  Surgeon: [name], MD;  Location: [facility] ORS;  Service: Vascular;  Laterality: Right;  . APPENDECTOMY    . COLONOSCOPY WITH PROPOFOL N/A 05/12/2018   Procedure: COLONOSCOPY WITH PROPOFOL;  Surgeon: [name], MD;  Location: [facility] ENDOSCOPY;  Service: Gastroenterology;  Laterality: N/A;  . ESOPHAGOGASTRODUODENOSCOPY (EGD) WITH PROPOFOL N/A 05/12/2018   Procedure: ESOPHAGOGASTRODUODENOSCOPY (EGD) WITH PROPOFOL;  Surgeon: [name], MD;  Location: [facility] ENDOSCOPY;  Service: Gastroenterology;  Laterality: N/A;  . HERNIA REPAIR     UMBILICAL  . LOWER EXTREMITY ANGIOGRAPHY Right 10/25/2017   Procedure: LOWER EXTREMITY ANGIOGRAPHY;  Surgeon: [name], MD;  Location: [place] CV LAB;  Service: Cardiovascular;  Laterality: Right;  . LOWER EXTREMITY ANGIOGRAPHY Right 01/13/2018   Procedure: LOWER EXTREMITY ANGIOGRAPHY;  Surgeon: [name], MD;  Location: [place] CV LAB;  Service: Cardiovascular;  Laterality: Right;  . LOWER EXTREMITY INTERVENTION  10/25/2017   Procedure: LOWER EXTREMITY INTERVENTION;  Surgeon: [name], MD;  Location: [place] CV LAB;  Service: Cardiovascular;;  . TONSILLECTOMY      SOCIAL HISTORY: Social History   Socioeconomic History  . Marital status: Married    Spouse name: Not on file  . Number of children: 0  . Years of education: Not on file  . Highest education level: Not on file  Occupational History  . Not on file  Social Needs  . Financial resource strain:  Not on file  . Food insecurity:    Worry: Not on file    Inability: Not on file  . Transportation needs:    Medical: Not on file    Non-medical: Not on file  Tobacco Use  . Smoking status: Former Smoker    Packs/day: 1.00    Years: 30.00    Pack years: 30.00    Types: Cigarettes    Last attempt to quit: 01/09/2018    Years since quitting: 0.4  . Smokeless tobacco: Never Used  Substance and Sexual Activity  . Alcohol use: Yes    Alcohol/week: 8.0 standard drinks    Types: 8 Cans of beer per week    Comment: occasionally beer   . Drug use: No  . Sexual activity: Yes  Lifestyle  . Physical activity:    Days per week: Not on file    Minutes per session: Not on file  . Stress: Not on file  Relationships  . Social connections:    Talks on phone: Not on file    Gets together: Not on file    Attends religious service: Not on file    Active member of club or organization: Not on  file    Attends meetings of clubs or organizations: Not on file    Relationship status: Not on file  . Intimate partner violence:    Fear of current or ex partner: Not on file    Emotionally abused: Not on file    Physically abused: Not on file    Forced sexual activity: Not on file  Other Topics Concern  . Not on file  Social History Narrative  . Not on file    FAMILY HISTORY: Family History  Problem Relation Age of Onset  . Diabetes Father   . Hypertension Father   . Hyperlipidemia Father     ALLERGIES:  is allergic to sodium pentobarbital [pentobarbital] and lipitor [atorvastatin].  MEDICATIONS:  Current Outpatient Medications  Medication Sig Dispense Refill  . acetaminophen (TYLENOL) 500 MG tablet Take 1,000 mg by mouth every 6 (six) hours as needed (for pain.).    Marland Kitchen apixaban (ELIQUIS) 5 MG TABS tablet Take 1 tablet (5 mg total) by mouth 2 (two) times daily. 180 tablet 1  . aspirin 81 MG EC tablet Take 1 tablet (81 mg total) by mouth daily. 90 tablet 4  . Biotin (BIOTIN 5000) 5 MG CAPS  Take 1 capsule by mouth daily.    . cholecalciferol (VITAMIN D) 1000 units tablet Take 1,000 Units by mouth daily.    Marland Kitchen docusate sodium (COLACE) 100 MG capsule Take 100 mg by mouth 2 (two) times daily as needed for mild constipation.    . empagliflozin (JARDIANCE) 10 MG TABS tablet Take 5 mg by mouth daily. 90 tablet 1  . ezetimibe-simvastatin (VYTORIN) 10-40 MG tablet Take 1 tablet by mouth daily at 6 PM. 90 tablet 3  . ferrous sulfate (FERROUSUL) 325 (65 FE) MG tablet Take 1 tablet (325 mg total) by mouth daily with breakfast. 90 tablet 3  . gabapentin (NEURONTIN) 400 MG capsule TAKE 1 CAPSULE (400 MG TOTAL) BY MOUTH 3 (THREE) TIMES DAILY. 90 capsule 1  . hydrochlorothiazide (MICROZIDE) 12.5 MG capsule Take 1 capsule (12.5 mg total) by mouth daily. 90 capsule 3  . metFORMIN (GLUCOPHAGE) 1000 MG tablet Take 1 tablet (1,000 mg total) by mouth 2 (two) times daily with a meal. 180 tablet 3  . metoprolol tartrate (LOPRESSOR) 25 MG tablet Take 1 tablet (25 mg total) by mouth 2 (two) times daily. 180 tablet 3  . Multiple Vitamin (MULTIVITAMIN WITH MINERALS) TABS tablet Take 1 tablet by mouth daily. 30 tablet 0  . oxyCODONE-acetaminophen (PERCOCET/ROXICET) 5-325 MG tablet One to Two Tabs Every Six Hours As Needed For Pain 50 tablet 0  . oxyCODONE-acetaminophen (PERCOCET/ROXICET) 5-325 MG tablet Take 1-2 tablets by mouth every 4 (four) hours as needed for up to 7 days for severe pain. 60 tablet 0  . vitamin C (ASCORBIC ACID) 500 MG tablet Take 500 mg by mouth daily.     No current facility-administered medications for this visit.      PHYSICAL EXAMINATION: ECOG PERFORMANCE STATUS: 1 - Symptomatic but completely ambulatory Vitals:   06/15/18 1324  BP: 103/65  Pulse: 95  Temp: 98 F (36.7 C)   Filed Weights    Physical Exam  Constitutional: He is oriented to person, place, and time. No distress.  HENT:  Head: Normocephalic and atraumatic.  Mouth/Throat: Oropharynx is clear and moist.    Eyes: Pupils are equal, round, and reactive to light. EOM are normal. No scleral icterus.  Neck: Normal range of motion. Neck supple.  Cardiovascular: Normal rate, regular rhythm and normal  heart sounds.  Pulmonary/Chest: Effort normal. No respiratory distress. He has no wheezes.  Abdominal: Soft. He exhibits no distension and no mass. There is no tenderness.  Musculoskeletal: He exhibits no tenderness.  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   Neurological: He is alert and oriented to person, place, and time. No cranial nerve deficit. Coordination normal.  Skin: Skin is warm and dry. No rash noted. No erythema.     LABORATORY DATA:  I have reviewed the data as listed Lab Results  Component Value Date   WBC 13.0 (H) 06/13/2018   HGB 10.0 (L) 06/13/2018   HCT 32.9 (L) 06/13/2018   MCV 79.5 (L) 06/13/2018   PLT 265 06/13/2018   Recent Labs    03/31/18 1446 04/06/18 1437 05/06/18 1624 05/26/18 1215 06/02/18 1940 06/03/18 0454 06/04/18 0512  NA 137 141 139 134*  --  137 137  K 4.4 4.3 4.4 4.3  --  3.7 3.7  CL 98 101 97 97*  --  103 102  CO2 24 22 22 28   --  27 28  GLUCOSE 137* 125* 113* 157*  --  143* 152*  BUN 16 11 11 16   --  9 14  CREATININE 0.68* 0.71* 0.67* 0.77 0.67 0.52* 0.70  CALCIUM 9.4 9.5 9.7 9.7  --  8.5* 8.7*  GFRNONAA 102 100 102 >60 >60 >60 >60  GFRAA 117 115 118 >60 >60 >60 >60  PROT 7.2 7.2 7.1  --   --   --   --   ALBUMIN 4.2 4.3 4.2  --   --   --   --   AST 14 28 15   --   --   --   --   ALT 16 19 13   --   --   --   --   ALKPHOS 88 80 82  --   --   --   --   BILITOT 0.4 0.4 0.2  --   --   --   --    Iron/TIBC/Ferritin/ %Sat    Component Value Date/Time   IRON 29 (L) 06/13/2018 1511   IRON 17 (L) 05/06/2018 1624   TIBC 392 06/13/2018 1511   TIBC 389 05/06/2018 1624   FERRITIN 40 06/13/2018 1511   FERRITIN 19 (L) 05/06/2018 1624   IRONPCTSAT 7 (L) 06/13/2018 1511   IRONPCTSAT 4 (LL) 05/06/2018 1624        ASSESSMENT & PLAN:  1.  Iron deficiency anemia, unspecified iron deficiency anemia type   2. Chronic anticoagulation   3. Chronic GI bleeding    Labs were reviewed and discussed with patient and his wife.  Hemoglobin has decreased, likely due to blood loss from recent surgery.  Recommend another dose of IV Feraheme 510mg  x 1 today.  Repeat labs in 3 months.   05/12/2018 upper and colonoscopy showed colon and rectum polyps, gastritis. No bleeding source discovered.  He is planned for capsule study.   Orders Placed This Encounter  Procedures  . CBC with Differential/Platelet    Standing Status:   Future    Standing Expiration Date:   06/16/2019  . Iron and TIBC    Standing Status:   Future    Standing Expiration Date:   06/16/2019  . Ferritin    Standing Status:   Future    Standing Expiration Date:   06/16/2019    All questions were answered. The patient knows to call the clinic with any problems questions or concerns.  Return of visit: 3 months Total face to face encounter time for this patient visit was 15 min. >50% of the time was  spent in counseling and coordination of care.  Earlie Server, MD, PhD Hematology Oncology [facility] at [facility] Pager- [phone] 06/15/2018

## 2018-06-16 ENCOUNTER — Telehealth (INDEPENDENT_AMBULATORY_CARE_PROVIDER_SITE_OTHER): Payer: Self-pay

## 2018-06-16 ENCOUNTER — Encounter (INDEPENDENT_AMBULATORY_CARE_PROVIDER_SITE_OTHER): Payer: Self-pay | Admitting: Nurse Practitioner

## 2018-06-16 DIAGNOSIS — Z89511 Acquired absence of right leg below knee: Secondary | ICD-10-CM

## 2018-06-16 HISTORY — DX: Acquired absence of right leg below knee: Z89.511

## 2018-06-16 NOTE — Telephone Encounter (Signed)
Patient's wife called and stated that he is having lots of pain, and that the prescribed pain medication is not helping to control his paine, even when he takes it every four hours.  She would like to know if there is anything else they can do to help his pain to subside and lessen? And she would like to know if there is anything else that can be prescribed?

## 2018-06-16 NOTE — Telephone Encounter (Signed)
Called the patient's wife back and she stated that the patient is already taking 400mg  po three times a day. This is more than what you are suggesting, is there anything else that she can do? I did explain that you said the the pain is neuropathic post op.

## 2018-06-16 NOTE — Telephone Encounter (Signed)
Called the patient's wife back to let her know that Dr. Lucky Cowboy is willing to write him for Percocet. She stated that the patient is not taking his Gabapentin as instructed, she is going to try to talk him into taking the Gabapentin regularly so that it will help with his neuropathic pain. If she thinks he will need another script for the Percocet, because he's already taking it, she will come by tomorrow to pick up the additional script.

## 2018-06-16 NOTE — Progress Notes (Signed)
Subjective:    Patient ID: Glenn Gerold., male    DOB: 1955/09/04, 62 y.o.   MRN: [id number] Chief Complaint  Patient presents with  . Follow-up    [facility] 1week     HPI  Glenn Ramesh. is a 62 y.o. male that is presenting today for a one-week follow-up after right below the knee amputation.  The patient endorses having severe pain still.  The patient denies any major issues with bleeding although there is still a spot that is oozing.  He denies any fever, chills, nausea, vomiting.  He denies any chest pain or shortness of breath.  He denies any CVA-like symptoms.  Past Medical History:  Diagnosis Date  . Anemia   . Atrial fibrillation ([place])   . Blind    right eye  . Diabetes mellitus with complication ([place])   . Dilated aortic root ([place])    a. 04/2018 Echo: 4.1cm. Asc Ao 3.5cm.  Marland Kitchen Dysrhythmia   . History of echocardiogram    a. 04/2018 Echo: Ef 60-65%, no rwma, midly to mod dil Ao root - 4.1cm. Asc Ao 3.5cm. Mild MR. Nl RV fxn. Nl PASP.  Marland Kitchen History of hernia repair   . History of stress test    a. 2016 MV ([name]): EF 58%, no ischemia.  . Hyperlipidemia   . Hypertension   . Leg pain   . Legally blind   . PAD (peripheral artery disease) ([place])   . PAF (paroxysmal atrial fibrillation) ([name])    a. on Eliquis as of 2018; b. CHADS2VASc => 5 (HTN, DM, stroke x 2, vascular disease)  . Peripheral vascular disease ([place])    a. followed by Dr. Lucky Cowboy; b. s/p kissing balloon stents and right external iliac stent in 10/2017; c. LE angiogrpahy 01/2018: No significant arterial occlusive disease in the lower extremities.  . Pulmonary nodules   . Stroke [facility])    a. 2016 & 2018    Past Surgical History:  Procedure Laterality Date  . AMPUTATION Right 06/02/2018   Procedure: AMPUTATION BELOW KNEE;  Surgeon: [name], MD;  Location: [facility] ORS;  Service: Vascular;  Laterality: Right;  . APPENDECTOMY    . COLONOSCOPY WITH PROPOFOL N/A 05/12/2018   Procedure: COLONOSCOPY WITH PROPOFOL;   Surgeon: [name], MD;  Location: [facility] ENDOSCOPY;  Service: Gastroenterology;  Laterality: N/A;  . ESOPHAGOGASTRODUODENOSCOPY (EGD) WITH PROPOFOL N/A 05/12/2018   Procedure: ESOPHAGOGASTRODUODENOSCOPY (EGD) WITH PROPOFOL;  Surgeon: [name], MD;  Location: [facility] ENDOSCOPY;  Service: Gastroenterology;  Laterality: N/A;  . HERNIA REPAIR     UMBILICAL  . LOWER EXTREMITY ANGIOGRAPHY Right 10/25/2017   Procedure: LOWER EXTREMITY ANGIOGRAPHY;  Surgeon: [name], MD;  Location: [place] CV LAB;  Service: Cardiovascular;  Laterality: Right;  . LOWER EXTREMITY ANGIOGRAPHY Right 01/13/2018   Procedure: LOWER EXTREMITY ANGIOGRAPHY;  Surgeon: [name], MD;  Location: [place] CV LAB;  Service: Cardiovascular;  Laterality: Right;  . LOWER EXTREMITY INTERVENTION  10/25/2017   Procedure: LOWER EXTREMITY INTERVENTION;  Surgeon: [name], MD;  Location: [place] CV LAB;  Service: Cardiovascular;;  . TONSILLECTOMY      Social History   Socioeconomic History  . Marital status: Married    Spouse name: Not on file  . Number of children: 0  . Years of education: Not on file  . Highest education level: Not on file  Occupational History  . Not on file  Social Needs  . Financial resource strain: Not  on file  . Food insecurity:    Worry: Not on file    Inability: Not on file  . Transportation needs:    Medical: Not on file    Non-medical: Not on file  Tobacco Use  . Smoking status: Former Smoker    Packs/day: 1.00    Years: 30.00    Pack years: 30.00    Types: Cigarettes    Last attempt to quit: 01/09/2018    Years since quitting: 0.4  . Smokeless tobacco: Never Used  Substance and Sexual Activity  . Alcohol use: Yes    Alcohol/week: 8.0 standard drinks    Types: 8 Cans of beer per week    Comment: occasionally beer   . Drug use: No  . Sexual activity: Yes  Lifestyle  . Physical activity:    Days per week: Not on file    Minutes per session: Not on file  . Stress:  Not on file  Relationships  . Social connections:    Talks on phone: Not on file    Gets together: Not on file    Attends religious service: Not on file    Active member of club or organization: Not on file    Attends meetings of clubs or organizations: Not on file    Relationship status: Not on file  . Intimate partner violence:    Fear of current or ex partner: Not on file    Emotionally abused: Not on file    Physically abused: Not on file    Forced sexual activity: Not on file  Other Topics Concern  . Not on file  Social History Narrative  . Not on file    Family History  Problem Relation Age of Onset  . Diabetes Father   . Hypertension Father   . Hyperlipidemia Father     Allergies  Allergen Reactions  . Sodium Pentobarbital [Pentobarbital] Shortness Of Breath  . Lipitor [Atorvastatin] Rash     Review of Systems   Review of Systems: Negative Unless Checked Constitutional: [] Weight loss  [] Fever  [] Chills Cardiac: [] Chest pain   [x]  Atrial Fibrillation  [] Palpitations   [] Shortness of breath when laying flat   [] Shortness of breath with exertion. Vascular:  [] Pain in legs with walking   [] Pain in legs with standing  [] History of DVT   [] Phlebitis   [] Swelling in legs   [] Varicose veins   [] Non-healing ulcers Pulmonary:   [] Uses home oxygen   [] Productive cough   [] Hemoptysis   [] Wheeze  [] COPD   [] Asthma Neurologic:  [] Dizziness   [] Seizures   [x] History of stroke   [] History of TIA  [] Aphasia   [] Vissual changes   [] Weakness or numbness in arm   [x] Weakness or numbness in leg Musculoskeletal:   [] Joint swelling   [] Joint pain   [] Low back pain  []  History of Knee Replacement Hematologic:  [] Easy bruising  [] Easy bleeding   [] Hypercoagulable state   [] Anemic Gastrointestinal:  [] Diarrhea   [] Vomiting  [] Gastroesophageal reflux/heartburn   [] Difficulty swallowing. Genitourinary:  [] Chronic kidney disease   [] Difficult urination  [] Anuric   [] Blood in urine Skin:   [] Rashes   [] Ulcers  Psychological:  [] History of anxiety   []  History of major depression  []  Memory Difficulties     Objective:   Physical Exam  BP 104/71 (BP Location: Left Arm)   Pulse (!) 103   Resp 17   Gen: WD/WN, NAD Head: [place]/AT, No temporalis wasting.  Ear/Nose/Throat: Hearing grossly intact, nares  w/o erythema or drainage Eyes: PER, EOMI, sclera nonicteric.  Neck: Supple, no masses.  No JVD.  Pulmonary:  Good air movement, no use of accessory muscles.  Cardiac: RRR Vascular:  Amputation site appears well approximated with some scabbing.  There is a small area that is still oozing slightly. Vessel Right Left  Radial Palpable Palpable  Gastrointestinal: soft, non-distended. No guarding/no peritoneal signs.  Musculoskeletal: M/S 5/5 throughout.  Right below the knee amputation Neurologic: Pain and light touch intact in extremities.  Symmetrical.  Speech is fluent. Motor exam as listed above. Psychiatric: Judgment intact, Mood & affect appropriate for pt's clinical situation. Dermatologic: No Venous rashes. No Ulcers Noted.  No changes consistent with cellulitis. Lymph : No Cervical lymphadenopathy, no lichenification or skin changes of chronic lymphedema.      Assessment & Plan:   1. Amputation of right lower extremity ([name]) Wound appears mostly well approximated.  The patient still having significant amounts of pain.  We are currently approximately 2 weeks from amputation date.  I will have the patient return in 1 to 2 weeks to begin staple removal.  She is reminded to keep leg as straight as possible as well as to work with physical therapy and Occupational Therapy as much as possible to prepare him for prosthesis.  Patient and wife understand.  - oxyCODONE-acetaminophen (PERCOCET/ROXICET) 5-325 MG tablet; Take 1-2 tablets by mouth every 4 (four) hours as needed for up to 7 days for severe pain.  Dispense: 60 tablet; Refill: 0  2. Mixed hyperlipidemia Continue  antihypertensive medications as already ordered, these medications have been reviewed and there are no changes at this time.    Current Outpatient Medications on File Prior to Visit  Medication Sig Dispense Refill  . acetaminophen (TYLENOL) 500 MG tablet Take 1,000 mg by mouth every 6 (six) hours as needed (for pain.).    Marland Kitchen apixaban (ELIQUIS) 5 MG TABS tablet Take 1 tablet (5 mg total) by mouth 2 (two) times daily. 180 tablet 1  . aspirin 81 MG EC tablet Take 1 tablet (81 mg total) by mouth daily. 90 tablet 4  . Biotin (BIOTIN 5000) 5 MG CAPS Take 1 capsule by mouth daily.    . cholecalciferol (VITAMIN D) 1000 units tablet Take 1,000 Units by mouth daily.    Marland Kitchen docusate sodium (COLACE) 100 MG capsule Take 100 mg by mouth 2 (two) times daily as needed for mild constipation.    . empagliflozin (JARDIANCE) 10 MG TABS tablet Take 5 mg by mouth daily. 90 tablet 1  . ezetimibe-simvastatin (VYTORIN) 10-40 MG tablet Take 1 tablet by mouth daily at 6 PM. 90 tablet 3  . ferrous sulfate (FERROUSUL) 325 (65 FE) MG tablet Take 1 tablet (325 mg total) by mouth daily with breakfast. 90 tablet 3  . gabapentin (NEURONTIN) 400 MG capsule TAKE 1 CAPSULE (400 MG TOTAL) BY MOUTH 3 (THREE) TIMES DAILY. 90 capsule 1  . hydrochlorothiazide (MICROZIDE) 12.5 MG capsule Take 1 capsule (12.5 mg total) by mouth daily. 90 capsule 3  . metFORMIN (GLUCOPHAGE) 1000 MG tablet Take 1 tablet (1,000 mg total) by mouth 2 (two) times daily with a meal. 180 tablet 3  . metoprolol tartrate (LOPRESSOR) 25 MG tablet Take 1 tablet (25 mg total) by mouth 2 (two) times daily. 180 tablet 3  . Multiple Vitamin (MULTIVITAMIN WITH MINERALS) TABS tablet Take 1 tablet by mouth daily. 30 tablet 0  . oxyCODONE-acetaminophen (PERCOCET/ROXICET) 5-325 MG tablet One to Two Tabs Every Six Hours  As Needed For Pain 50 tablet 0  . vitamin C (ASCORBIC ACID) 500 MG tablet Take 500 mg by mouth daily.     No current facility-administered medications on file  prior to visit.     There are no Patient Instructions on file for this visit. No follow-ups on file.   Kris Hartmann, NP  This note was completed with Sales executive.  Any errors are purely unintentional.

## 2018-06-17 ENCOUNTER — Telehealth (INDEPENDENT_AMBULATORY_CARE_PROVIDER_SITE_OTHER): Payer: Self-pay

## 2018-06-17 DIAGNOSIS — E1152 Type 2 diabetes mellitus with diabetic peripheral angiopathy with gangrene: Secondary | ICD-10-CM | POA: Diagnosis not present

## 2018-06-17 DIAGNOSIS — I77810 Thoracic aortic ectasia: Secondary | ICD-10-CM | POA: Diagnosis not present

## 2018-06-17 DIAGNOSIS — Z4781 Encounter for orthopedic aftercare following surgical amputation: Secondary | ICD-10-CM | POA: Diagnosis not present

## 2018-06-17 DIAGNOSIS — Z89511 Acquired absence of right leg below knee: Secondary | ICD-10-CM | POA: Diagnosis not present

## 2018-06-17 DIAGNOSIS — I10 Essential (primary) hypertension: Secondary | ICD-10-CM | POA: Diagnosis not present

## 2018-06-17 DIAGNOSIS — R911 Solitary pulmonary nodule: Secondary | ICD-10-CM | POA: Diagnosis not present

## 2018-06-17 DIAGNOSIS — D649 Anemia, unspecified: Secondary | ICD-10-CM | POA: Diagnosis not present

## 2018-06-17 DIAGNOSIS — I69351 Hemiplegia and hemiparesis following cerebral infarction affecting right dominant side: Secondary | ICD-10-CM | POA: Diagnosis not present

## 2018-06-17 DIAGNOSIS — H5461 Unqualified visual loss, right eye, normal vision left eye: Secondary | ICD-10-CM | POA: Diagnosis not present

## 2018-06-17 DIAGNOSIS — I480 Paroxysmal atrial fibrillation: Secondary | ICD-10-CM | POA: Diagnosis not present

## 2018-06-17 NOTE — Telephone Encounter (Signed)
Patient wife called stating that [name] was still having pain from his amputation.She inform me that he is taking Gabapentin 400mg  every 6-8hours and Percocet every 4hours for pain.I spoke with Dew and he advise for the patient to continue taking both medications to help control the pain and he wrote a prescription for Percocet 5/325mg  every 4hours prn #30 with no refills.

## 2018-06-21 ENCOUNTER — Telehealth (INDEPENDENT_AMBULATORY_CARE_PROVIDER_SITE_OTHER): Payer: Self-pay

## 2018-06-21 NOTE — Telephone Encounter (Signed)
Can you call and make an appointment for patient to be seen this week

## 2018-06-21 NOTE — Telephone Encounter (Signed)
Patient will be coming in tomorrow to see [name].A

## 2018-06-22 ENCOUNTER — Ambulatory Visit (INDEPENDENT_AMBULATORY_CARE_PROVIDER_SITE_OTHER): Payer: Medicare HMO | Admitting: Vascular Surgery

## 2018-06-22 ENCOUNTER — Encounter (INDEPENDENT_AMBULATORY_CARE_PROVIDER_SITE_OTHER): Payer: Self-pay | Admitting: Vascular Surgery

## 2018-06-22 VITALS — BP 116/75 | HR 101 | Resp 17

## 2018-06-22 DIAGNOSIS — I77810 Thoracic aortic ectasia: Secondary | ICD-10-CM | POA: Diagnosis not present

## 2018-06-22 DIAGNOSIS — F17211 Nicotine dependence, cigarettes, in remission: Secondary | ICD-10-CM

## 2018-06-22 DIAGNOSIS — E1152 Type 2 diabetes mellitus with diabetic peripheral angiopathy with gangrene: Secondary | ICD-10-CM | POA: Diagnosis not present

## 2018-06-22 DIAGNOSIS — E782 Mixed hyperlipidemia: Secondary | ICD-10-CM

## 2018-06-22 DIAGNOSIS — I69351 Hemiplegia and hemiparesis following cerebral infarction affecting right dominant side: Secondary | ICD-10-CM | POA: Diagnosis not present

## 2018-06-22 DIAGNOSIS — Z89511 Acquired absence of right leg below knee: Secondary | ICD-10-CM | POA: Diagnosis not present

## 2018-06-22 DIAGNOSIS — H5461 Unqualified visual loss, right eye, normal vision left eye: Secondary | ICD-10-CM | POA: Diagnosis not present

## 2018-06-22 DIAGNOSIS — Z89512 Acquired absence of left leg below knee: Secondary | ICD-10-CM

## 2018-06-22 DIAGNOSIS — R911 Solitary pulmonary nodule: Secondary | ICD-10-CM | POA: Diagnosis not present

## 2018-06-22 DIAGNOSIS — D500 Iron deficiency anemia secondary to blood loss (chronic): Secondary | ICD-10-CM

## 2018-06-22 DIAGNOSIS — Z4781 Encounter for orthopedic aftercare following surgical amputation: Secondary | ICD-10-CM | POA: Diagnosis not present

## 2018-06-22 DIAGNOSIS — D649 Anemia, unspecified: Secondary | ICD-10-CM | POA: Diagnosis not present

## 2018-06-22 DIAGNOSIS — I480 Paroxysmal atrial fibrillation: Secondary | ICD-10-CM | POA: Diagnosis not present

## 2018-06-22 DIAGNOSIS — I10 Essential (primary) hypertension: Secondary | ICD-10-CM | POA: Diagnosis not present

## 2018-06-22 MED ORDER — SILVER SULFADIAZINE 1 % EX CREA: 1.0000 "application " | TOPICAL_CREAM | Freq: Every day | CUTANEOUS | 5 refills | Status: DC

## 2018-06-22 NOTE — Progress Notes (Signed)
Subjective:    Patient ID: [name]., male    DOB: 09/20/1955, 62 y.o.   MRN: [id number] Chief Complaint  Patient presents with  . Follow-up    blisters below amputation site   Patient presents sooner than his originally scheduled postoperative follow-up.  Patient presents today with the formation of "blisters" to his stump.  Over the last few days, the patient has noticed clear liquid filled blisters forming to the bottom of his stump.  The patient was seen with his wife.  The patient's wife also notes that the purplish discoloration starting at the patient's incision is now progressing under the stump and towards the back of the thigh.  There is also an increase in the size of the scab along the incision line.  The patient notes that his stump does not hurt him.  The wife says that his stump does hurt him when the pain medication wears off.  Aside from the newly forming blisters skin is intact.  The patient states that he has been working on straightening his leg however he is having great difficulty straightening his leg during today's examination.  The patient denies any fever, nausea vomiting.  Review of Systems  Constitutional: Negative.   HENT: Negative.   Eyes: Negative.   Respiratory: Negative.   Cardiovascular: Negative.   Gastrointestinal: Negative.   Endocrine: Negative.   Genitourinary: Negative.   Musculoskeletal: Negative.   Skin: Positive for color change and wound.  Allergic/Immunologic: Negative.   Neurological: Negative.   Hematological: Negative.   Psychiatric/Behavioral: Negative.       Objective:   Physical Exam  Constitutional: He is oriented to person, place, and time. He appears well-developed and well-nourished. No distress.  HENT:  Head: Normocephalic and atraumatic.  Right Ear: External ear normal.  Left Ear: External ear normal.  Eyes: Pupils are equal, round, and reactive to light. Conjunctivae and EOM are normal.  Neck: Normal range of  motion.  Cardiovascular: Normal rate, regular rhythm and normal heart sounds.  Pulmonary/Chest: Effort normal and breath sounds normal.  Musculoskeletal: Normal range of motion.  Right lower extremity: It is unable to straighten right knee at the joint.  Neurological: He is alert and oriented to person, place, and time.  Skin: He is not diaphoretic.  Right below the knee amputation stump: Purple and mottled appearance to the stump.  Stump is cooler to touch.  There is too clear fluid-filled blisters to the bottom of the stump.  The incision has an area of dry gangrene.  Staples are intact.  Thigh is soft.  Hard to palpate a popliteal pulse.  Psychiatric: He has a normal mood and affect. His behavior is normal. Judgment and thought content normal.  Vitals reviewed.  BP 116/75 (BP Location: Left Arm)   Pulse (!) 101   Resp 17   Past Medical History:  Diagnosis Date  . Anemia   . Atrial fibrillation ([place])   . Blind    right eye  . Diabetes mellitus with complication ([place])   . Dilated aortic root ([place])    a. 04/2018 Echo: 4.1cm. Asc Ao 3.5cm.  Marland Kitchen Dysrhythmia   . History of echocardiogram    a. 04/2018 Echo: Ef 60-65%, no rwma, midly to mod dil Ao root - 4.1cm. Asc Ao 3.5cm. Mild MR. Nl RV fxn. Nl PASP.  Marland Kitchen History of hernia repair   . History of stress test    a. 2016 MV ([name]): EF 58%, no ischemia.  Marland Kitchen  Hyperlipidemia   . Hypertension   . Leg pain   . Legally blind   . PAD (peripheral artery disease) ([place])   . PAF (paroxysmal atrial fibrillation) ([name])    a. on Eliquis as of 2018; b. CHADS2VASc => 5 (HTN, DM, stroke x 2, vascular disease)  . Peripheral vascular disease ([place])    a. followed by Dr. Lucky Cowboy; b. s/p kissing balloon stents and right external iliac stent in 10/2017; c. LE angiogrpahy 01/2018: No significant arterial occlusive disease in the lower extremities.  . Pulmonary nodules   . Stroke [facility])    a. 2016 & 2018   Social History   Socioeconomic History  . Marital status:  Married    Spouse name: Not on file  . Number of children: 0  . Years of education: Not on file  . Highest education level: Not on file  Occupational History  . Not on file  Social Needs  . Financial resource strain: Not on file  . Food insecurity:    Worry: Not on file    Inability: Not on file  . Transportation needs:    Medical: Not on file    Non-medical: Not on file  Tobacco Use  . Smoking status: Former Smoker    Packs/day: 1.00    Years: 30.00    Pack years: 30.00    Types: Cigarettes    Last attempt to quit: 01/09/2018    Years since quitting: 0.4  . Smokeless tobacco: Never Used  Substance and Sexual Activity  . Alcohol use: Yes    Alcohol/week: 8.0 standard drinks    Types: 8 Cans of beer per week    Comment: occasionally beer   . Drug use: No  . Sexual activity: Yes  Lifestyle  . Physical activity:    Days per week: Not on file    Minutes per session: Not on file  . Stress: Not on file  Relationships  . Social connections:    Talks on phone: Not on file    Gets together: Not on file    Attends religious service: Not on file    Active member of club or organization: Not on file    Attends meetings of clubs or organizations: Not on file    Relationship status: Not on file  . Intimate partner violence:    Fear of current or ex partner: Not on file    Emotionally abused: Not on file    Physically abused: Not on file    Forced sexual activity: Not on file  Other Topics Concern  . Not on file  Social History Narrative  . Not on file   Past Surgical History:  Procedure Laterality Date  . AMPUTATION Right 06/02/2018   Procedure: AMPUTATION BELOW KNEE;  Surgeon: [name], MD;  Location: [facility] ORS;  Service: Vascular;  Laterality: Right;  . APPENDECTOMY    . COLONOSCOPY WITH PROPOFOL N/A 05/12/2018   Procedure: COLONOSCOPY WITH PROPOFOL;  Surgeon: [name], MD;  Location: [facility] ENDOSCOPY;  Service: Gastroenterology;  Laterality: N/A;  .  ESOPHAGOGASTRODUODENOSCOPY (EGD) WITH PROPOFOL N/A 05/12/2018   Procedure: ESOPHAGOGASTRODUODENOSCOPY (EGD) WITH PROPOFOL;  Surgeon: [name], MD;  Location: [facility] ENDOSCOPY;  Service: Gastroenterology;  Laterality: N/A;  . HERNIA REPAIR     UMBILICAL  . LOWER EXTREMITY ANGIOGRAPHY Right 10/25/2017   Procedure: LOWER EXTREMITY ANGIOGRAPHY;  Surgeon: [name], MD;  Location: [place] CV LAB;  Service: Cardiovascular;  Laterality: Right;  . LOWER EXTREMITY ANGIOGRAPHY Right  01/13/2018   Procedure: LOWER EXTREMITY ANGIOGRAPHY;  Surgeon: [name], MD;  Location: [place] CV LAB;  Service: Cardiovascular;  Laterality: Right;  . LOWER EXTREMITY INTERVENTION  10/25/2017   Procedure: LOWER EXTREMITY INTERVENTION;  Surgeon: [name], MD;  Location: [place] CV LAB;  Service: Cardiovascular;;  . TONSILLECTOMY     Family History  Problem Relation Age of Onset  . Diabetes Father   . Hypertension Father   . Hyperlipidemia Father    Allergies  Allergen Reactions  . Sodium Pentobarbital [Pentobarbital] Shortness Of Breath  . Lipitor [Atorvastatin] Rash      Assessment & Plan:  Patient presents sooner than his originally scheduled postoperative follow-up.  Patient presents today with the formation of "blisters" to his stump.  Over the last few days, the patient has noticed clear liquid filled blisters forming to the bottom of his stump.  The patient was seen with his wife.  The patient's wife also notes that the purplish discoloration starting at the patient's incision is now progressing under the stump and towards the back of the thigh.  There is also an increase in the size of the scab along the incision line.  The patient notes that his stump does not hurt him.  The wife says that his stump does hurt him when the pain medication wears off.  Aside from the newly forming blisters skin is intact.  The patient states that he has been working on straightening his leg however he is  having great difficulty straightening his leg during today's examination.  The patient denies any fever, nausea vomiting.  1. S/P bilateral BKA (below knee amputation) ([place]) - Stable The patient presents today with a decompensation in the physical appearance to his stump. There is now purple/mottled coloring to the bottom of the stump. There is about an inch of gangrenous tissue covering the patient's staple line. The stump is cool to touch. There are 2 small clear fluid-filled blisters noted to the bottom of the stump Unable to palpate a popliteal pulse Going to bring the patient back ASAP and have him undergo a right lower extremity arterial duplex to assess his arterial patency. Prescribe Silvadene to be applied to the suture line and stump The patient understands that his stump may need debridement or revision.  - VAS [place] LOWER EXTREMITY ARTERIAL DUPLEX; Future  2. Mixed hyperlipidemia - Stable ASA and Eliquis. Encouraged good control as its slows the progression of atherosclerotic disease  3. Iron deficiency anemia due to chronic blood loss - Stable This followed by the patient's primary care physician  Current Outpatient Medications on File Prior to Visit  Medication Sig Dispense Refill  . acetaminophen (TYLENOL) 500 MG tablet Take 1,000 mg by mouth every 6 (six) hours as needed (for pain.).    Marland Kitchen apixaban (ELIQUIS) 5 MG TABS tablet Take 1 tablet (5 mg total) by mouth 2 (two) times daily. 180 tablet 1  . aspirin 81 MG EC tablet Take 1 tablet (81 mg total) by mouth daily. 90 tablet 4  . Biotin (BIOTIN 5000) 5 MG CAPS Take 1 capsule by mouth daily.    . cholecalciferol (VITAMIN D) 1000 units tablet Take 1,000 Units by mouth daily.    Marland Kitchen docusate sodium (COLACE) 100 MG capsule Take 100 mg by mouth 2 (two) times daily as needed for mild constipation.    . empagliflozin (JARDIANCE) 10 MG TABS tablet Take 5 mg by mouth daily. 90 tablet 1  . ezetimibe-simvastatin (VYTORIN) 10-40  MG tablet  Take 1 tablet by mouth daily at 6 PM. 90 tablet 3  . ferrous sulfate (FERROUSUL) 325 (65 FE) MG tablet Take 1 tablet (325 mg total) by mouth daily with breakfast. 90 tablet 3  . gabapentin (NEURONTIN) 400 MG capsule TAKE 1 CAPSULE (400 MG TOTAL) BY MOUTH 3 (THREE) TIMES DAILY. 90 capsule 1  . hydrochlorothiazide (MICROZIDE) 12.5 MG capsule Take 1 capsule (12.5 mg total) by mouth daily. 90 capsule 3  . metFORMIN (GLUCOPHAGE) 1000 MG tablet Take 1 tablet (1,000 mg total) by mouth 2 (two) times daily with a meal. 180 tablet 3  . metoprolol tartrate (LOPRESSOR) 25 MG tablet Take 1 tablet (25 mg total) by mouth 2 (two) times daily. 180 tablet 3  . Multiple Vitamin (MULTIVITAMIN WITH MINERALS) TABS tablet Take 1 tablet by mouth daily. 30 tablet 0  . oxyCODONE-acetaminophen (PERCOCET/ROXICET) 5-325 MG tablet One to Two Tabs Every Six Hours As Needed For Pain 50 tablet 0  . vitamin C (ASCORBIC ACID) 500 MG tablet Take 500 mg by mouth daily.     No current facility-administered medications on file prior to visit.    There are no Patient Instructions on file for this visit. No follow-ups on file.  Armonte Tortorella A Foy Vanduyne, PA-C

## 2018-06-23 DIAGNOSIS — H5461 Unqualified visual loss, right eye, normal vision left eye: Secondary | ICD-10-CM | POA: Diagnosis not present

## 2018-06-23 DIAGNOSIS — I480 Paroxysmal atrial fibrillation: Secondary | ICD-10-CM | POA: Diagnosis not present

## 2018-06-23 DIAGNOSIS — D649 Anemia, unspecified: Secondary | ICD-10-CM | POA: Diagnosis not present

## 2018-06-23 DIAGNOSIS — I10 Essential (primary) hypertension: Secondary | ICD-10-CM | POA: Diagnosis not present

## 2018-06-23 DIAGNOSIS — Z89511 Acquired absence of right leg below knee: Secondary | ICD-10-CM | POA: Diagnosis not present

## 2018-06-23 DIAGNOSIS — Z4781 Encounter for orthopedic aftercare following surgical amputation: Secondary | ICD-10-CM | POA: Diagnosis not present

## 2018-06-23 DIAGNOSIS — R911 Solitary pulmonary nodule: Secondary | ICD-10-CM | POA: Diagnosis not present

## 2018-06-23 DIAGNOSIS — I69351 Hemiplegia and hemiparesis following cerebral infarction affecting right dominant side: Secondary | ICD-10-CM | POA: Diagnosis not present

## 2018-06-23 DIAGNOSIS — I77810 Thoracic aortic ectasia: Secondary | ICD-10-CM | POA: Diagnosis not present

## 2018-06-23 DIAGNOSIS — E1152 Type 2 diabetes mellitus with diabetic peripheral angiopathy with gangrene: Secondary | ICD-10-CM | POA: Diagnosis not present

## 2018-06-24 ENCOUNTER — Ambulatory Visit (INDEPENDENT_AMBULATORY_CARE_PROVIDER_SITE_OTHER): Payer: Medicare HMO | Admitting: Vascular Surgery

## 2018-06-24 ENCOUNTER — Telehealth (INDEPENDENT_AMBULATORY_CARE_PROVIDER_SITE_OTHER): Payer: Self-pay

## 2018-06-24 ENCOUNTER — Encounter (INDEPENDENT_AMBULATORY_CARE_PROVIDER_SITE_OTHER): Payer: Self-pay | Admitting: Vascular Surgery

## 2018-06-24 ENCOUNTER — Ambulatory Visit (INDEPENDENT_AMBULATORY_CARE_PROVIDER_SITE_OTHER): Payer: Medicare HMO

## 2018-06-24 VITALS — BP 112/73 | HR 91 | Resp 18 | Ht 70.0 in | Wt 180.0 lb

## 2018-06-24 DIAGNOSIS — Z89511 Acquired absence of right leg below knee: Secondary | ICD-10-CM

## 2018-06-24 DIAGNOSIS — I480 Paroxysmal atrial fibrillation: Secondary | ICD-10-CM

## 2018-06-24 DIAGNOSIS — Z89512 Acquired absence of left leg below knee: Secondary | ICD-10-CM

## 2018-06-24 DIAGNOSIS — I739 Peripheral vascular disease, unspecified: Secondary | ICD-10-CM

## 2018-06-24 NOTE — Progress Notes (Signed)
Patient ID: Glenn Gerold., male   DOB: Dec 14, 1955, 62 y.o.   MRN: [id number]  Chief Complaint  Patient presents with  . Follow-up    ASAP Arterial duplex    HPI Glenn Bledsoe. is a 62 y.o. male.  Patient returns in follow-up of his right below-knee amputation.  The bruising and now some blistering has largely tracked to the underside of his stump at this point.  He does have some dark areas of the skin along the incision that are likely going to create a significant scab.  He is studied with duplex today to ensure that his perfusion remains intact and he has normal triphasic waveforms down to the popliteal artery.   Past Medical History:  Diagnosis Date  . Anemia   . Atrial fibrillation ([place])   . Blind    right eye  . Diabetes mellitus with complication ([place])   . Dilated aortic root ([place])    a. 04/2018 Echo: 4.1cm. Asc Ao 3.5cm.  Marland Kitchen Dysrhythmia   . History of echocardiogram    a. 04/2018 Echo: Ef 60-65%, no rwma, midly to mod dil Ao root - 4.1cm. Asc Ao 3.5cm. Mild MR. Nl RV fxn. Nl PASP.  Marland Kitchen History of hernia repair   . History of stress test    a. 2016 MV ([name]): EF 58%, no ischemia.  . Hyperlipidemia   . Hypertension   . Leg pain   . Legally blind   . PAD (peripheral artery disease) ([place])   . PAF (paroxysmal atrial fibrillation) ([name])    a. on Eliquis as of 2018; b. CHADS2VASc => 5 (HTN, DM, stroke x 2, vascular disease)  . Peripheral vascular disease ([place])    a. followed by Dr. Lucky Cowboy; b. s/p kissing balloon stents and right external iliac stent in 10/2017; c. LE angiogrpahy 01/2018: No significant arterial occlusive disease in the lower extremities.  . Pulmonary nodules   . Stroke [facility])    a. 2016 & 2018    Past Surgical History:  Procedure Laterality Date  . AMPUTATION Right 06/02/2018   Procedure: AMPUTATION BELOW KNEE;  Surgeon: [name], MD;  Location: [facility] ORS;  Service: Vascular;  Laterality: Right;  . APPENDECTOMY    . COLONOSCOPY WITH PROPOFOL N/A  05/12/2018   Procedure: COLONOSCOPY WITH PROPOFOL;  Surgeon: [name], MD;  Location: [facility] ENDOSCOPY;  Service: Gastroenterology;  Laterality: N/A;  . ESOPHAGOGASTRODUODENOSCOPY (EGD) WITH PROPOFOL N/A 05/12/2018   Procedure: ESOPHAGOGASTRODUODENOSCOPY (EGD) WITH PROPOFOL;  Surgeon: [name], MD;  Location: [facility] ENDOSCOPY;  Service: Gastroenterology;  Laterality: N/A;  . HERNIA REPAIR     UMBILICAL  . LOWER EXTREMITY ANGIOGRAPHY Right 10/25/2017   Procedure: LOWER EXTREMITY ANGIOGRAPHY;  Surgeon: [name], MD;  Location: [place] CV LAB;  Service: Cardiovascular;  Laterality: Right;  . LOWER EXTREMITY ANGIOGRAPHY Right 01/13/2018   Procedure: LOWER EXTREMITY ANGIOGRAPHY;  Surgeon: [name], MD;  Location: [place] CV LAB;  Service: Cardiovascular;  Laterality: Right;  . LOWER EXTREMITY INTERVENTION  10/25/2017   Procedure: LOWER EXTREMITY INTERVENTION;  Surgeon: [name], MD;  Location: [place] CV LAB;  Service: Cardiovascular;;  . TONSILLECTOMY        Allergies  Allergen Reactions  . Sodium Pentobarbital [Pentobarbital] Shortness Of Breath  . Lipitor [Atorvastatin] Rash    Current Outpatient Medications  Medication Sig Dispense Refill  . acetaminophen (TYLENOL) 500 MG tablet Take 1,000 mg by mouth every 6 (six) hours as needed (for  pain.).    Marland Kitchen apixaban (ELIQUIS) 5 MG TABS tablet Take 1 tablet (5 mg total) by mouth 2 (two) times daily. 180 tablet 1  . aspirin 81 MG EC tablet Take 1 tablet (81 mg total) by mouth daily. 90 tablet 4  . Biotin (BIOTIN 5000) 5 MG CAPS Take 1 capsule by mouth daily.    . cholecalciferol (VITAMIN D) 1000 units tablet Take 1,000 Units by mouth daily.    Marland Kitchen docusate sodium (COLACE) 100 MG capsule Take 100 mg by mouth 2 (two) times daily as needed for mild constipation.    . empagliflozin (JARDIANCE) 10 MG TABS tablet Take 5 mg by mouth daily. 90 tablet 1  . ezetimibe-simvastatin (VYTORIN) 10-40 MG tablet Take 1 tablet by mouth  daily at 6 PM. 90 tablet 3  . ferrous sulfate (FERROUSUL) 325 (65 FE) MG tablet Take 1 tablet (325 mg total) by mouth daily with breakfast. 90 tablet 3  . gabapentin (NEURONTIN) 400 MG capsule TAKE 1 CAPSULE (400 MG TOTAL) BY MOUTH 3 (THREE) TIMES DAILY. 90 capsule 1  . hydrochlorothiazide (MICROZIDE) 12.5 MG capsule Take 1 capsule (12.5 mg total) by mouth daily. 90 capsule 3  . metFORMIN (GLUCOPHAGE) 1000 MG tablet Take 1 tablet (1,000 mg total) by mouth 2 (two) times daily with a meal. 180 tablet 3  . metoprolol tartrate (LOPRESSOR) 25 MG tablet Take 1 tablet (25 mg total) by mouth 2 (two) times daily. 180 tablet 3  . Multiple Vitamin (MULTIVITAMIN WITH MINERALS) TABS tablet Take 1 tablet by mouth daily. 30 tablet 0  . oxyCODONE-acetaminophen (PERCOCET/ROXICET) 5-325 MG tablet One to Two Tabs Every Six Hours As Needed For Pain 50 tablet 0  . silver sulfADIAZINE (SILVADENE) 1 % cream Apply 1 application topically daily. Apply to stump daily. 50 g 5  . vitamin C (ASCORBIC ACID) 500 MG tablet Take 500 mg by mouth daily.     No current facility-administered medications for this visit.         Physical Exam BP 112/73 (BP Location: Left Arm, Patient Position: Sitting)   Pulse 91   Resp 18   Ht 5\' 10"  (1.778 m)   Wt 180 lb (81.6 kg)   BMI 25.83 kg/m  Gen:  WD/WN, NAD Skin: Right BKA stump with some discoloration and blistering now mostly on the underside of the stump.  Some dark eschar along parts of the incision as well.  No purulent drainage or erythema.     Assessment/Plan:  PVD (peripheral vascular disease) ([place]) Perfusion checked today and is adequate to the stump with normal triphasic waveforms to the right popliteal artery.  A-fib ([name]) On anticoagulation.  Hx of BKA, right ([place]) He still has some questionable areas of his right below-knee amputation stump, but nothing that would require surgical debridement or antibiotics at this time.  No obvious infection.  We will  continue to keep a close eye on this and watch in about 2 weeks.  He does have adequate perfusion for wound healing.  He will contact our office with any problems in the interim.  His pain remains significant, and we have provided him with a lot of narcotics and increased his Neurontin which I have told him he can increase the Neurontin again.  I am going to put in a referral to the pain clinic.      Leotis Pain 06/24/2018, 2:30 PM   This note was created with Dragon medical transcription system.  Any errors from dictation are unintentional.

## 2018-06-24 NOTE — Assessment & Plan Note (Signed)
On anticoagulation 

## 2018-06-24 NOTE — Telephone Encounter (Signed)
Called the OT back to let her know that the orders that she was requesting the verbal on, that Dr. Lucky Cowboy said that it's okay for her to do one week of one, and the speech therapy.

## 2018-06-24 NOTE — Assessment & Plan Note (Signed)
He still has some questionable areas of his right below-knee amputation stump, but nothing that would require surgical debridement or antibiotics at this time.  No obvious infection.  We will continue to keep a close eye on this and watch in about 2 weeks.  He does have adequate perfusion for wound healing.  He will contact our office with any problems in the interim.  His pain remains significant, and we have provided him with a lot of narcotics and increased his Neurontin which I have told him he can increase the Neurontin again.  I am going to put in a referral to the pain clinic.

## 2018-06-24 NOTE — Telephone Encounter (Signed)
OT called to see if she could get an arder for this patient to have another round of one visit per week for 3 weeks, and a verbal for a speech evaluation to help the patient with finishing his sentences and his thoughts?  I will call her and give her the verbal if it's okay with you.

## 2018-06-24 NOTE — Assessment & Plan Note (Signed)
Perfusion checked today and is adequate to the stump with normal triphasic waveforms to the right popliteal artery.

## 2018-06-27 ENCOUNTER — Other Ambulatory Visit: Payer: Self-pay

## 2018-06-27 ENCOUNTER — Emergency Department
Admission: EM | Admit: 2018-06-27 | Discharge: 2018-06-27 | Disposition: A | Discharge status: Home / Self Care | Payer: Medicare HMO | Attending: Emergency Medicine | Admitting: Emergency Medicine

## 2018-06-27 ENCOUNTER — Encounter: Payer: Self-pay | Admitting: Gastroenterology

## 2018-06-27 ENCOUNTER — Encounter: Payer: Self-pay | Admitting: Emergency Medicine

## 2018-06-27 ENCOUNTER — Telehealth (INDEPENDENT_AMBULATORY_CARE_PROVIDER_SITE_OTHER): Payer: Self-pay

## 2018-06-27 ENCOUNTER — Ambulatory Visit: Payer: Medicare HMO | Admitting: Gastroenterology

## 2018-06-27 VITALS — BP 117/67 | HR 99 | Ht 70.0 in

## 2018-06-27 DIAGNOSIS — Y9301 Activity, walking, marching and hiking: Secondary | ICD-10-CM | POA: Diagnosis not present

## 2018-06-27 DIAGNOSIS — Y92009 Unspecified place in unspecified non-institutional (private) residence as the place of occurrence of the external cause: Secondary | ICD-10-CM

## 2018-06-27 DIAGNOSIS — D509 Iron deficiency anemia, unspecified: Secondary | ICD-10-CM

## 2018-06-27 DIAGNOSIS — S8991XA Unspecified injury of right lower leg, initial encounter: Secondary | ICD-10-CM | POA: Diagnosis not present

## 2018-06-27 DIAGNOSIS — Z89511 Acquired absence of right leg below knee: Secondary | ICD-10-CM | POA: Insufficient documentation

## 2018-06-27 DIAGNOSIS — Z7689 Persons encountering health services in other specified circumstances: Secondary | ICD-10-CM

## 2018-06-27 DIAGNOSIS — Z7901 Long term (current) use of anticoagulants: Secondary | ICD-10-CM | POA: Diagnosis not present

## 2018-06-27 DIAGNOSIS — Z7984 Long term (current) use of oral hypoglycemic drugs: Secondary | ICD-10-CM | POA: Diagnosis not present

## 2018-06-27 DIAGNOSIS — Y999 Unspecified external cause status: Secondary | ICD-10-CM | POA: Diagnosis not present

## 2018-06-27 DIAGNOSIS — Z87891 Personal history of nicotine dependence: Secondary | ICD-10-CM | POA: Diagnosis not present

## 2018-06-27 DIAGNOSIS — Y929 Unspecified place or not applicable: Secondary | ICD-10-CM | POA: Insufficient documentation

## 2018-06-27 DIAGNOSIS — E1140 Type 2 diabetes mellitus with diabetic neuropathy, unspecified: Secondary | ICD-10-CM | POA: Insufficient documentation

## 2018-06-27 DIAGNOSIS — I10 Essential (primary) hypertension: Secondary | ICD-10-CM | POA: Insufficient documentation

## 2018-06-27 DIAGNOSIS — W010XXA Fall on same level from slipping, tripping and stumbling without subsequent striking against object, initial encounter: Secondary | ICD-10-CM | POA: Insufficient documentation

## 2018-06-27 DIAGNOSIS — Z4889 Encounter for other specified surgical aftercare: Secondary | ICD-10-CM

## 2018-06-27 DIAGNOSIS — W19XXXA Unspecified fall, initial encounter: Secondary | ICD-10-CM

## 2018-06-27 MED ORDER — OXYCODONE-ACETAMINOPHEN 5-325 MG PO TABS: 1.0000 | ORAL_TABLET | Freq: Four times a day (QID) | ORAL | 0 refills | Status: DC | PRN

## 2018-06-27 MED ORDER — OXYCODONE-ACETAMINOPHEN 5-325 MG PO TABS
1.0000 | ORAL_TABLET | Freq: Once | ORAL | Status: AC
  Administered 2018-06-27: 1 via ORAL
  Filled 2018-06-27: qty 1

## 2018-06-27 NOTE — Telephone Encounter (Signed)
Please contact the patient back to find out how much he is bleeding as well as how long it has been bleeding for.  If he is bleeding excessively he should go to the emergency room.  If it is a small trickle he should hold pressure elevate his amputation site until the bleeding ceases.

## 2018-06-27 NOTE — Progress Notes (Signed)
[name] MD, MRCP(U.K) [address]  [place]  [place], [place] [address]  Main: [phone]  Fax: [phone]   Primary Care Physician: [name], DO  Primary Gastroenterologist:  Dr. [name]   No chief complaint on file.   HPI: [name]. is a 62 y.o. male    Summary of history :  Initially referred an seen in 04/2010 for iron deficiency anemia. Noted to have microcytic anemia. Normal 7 months earlier.  Iron studies performed on 04/07/2018 demonstrates a ferritin of 13.  Fecal occult blood testing is positive.  He is on Eliquis. Denied any overt blood loss.   Interval history   04/13/2018-  06/27/2018   04/13/18: no blood in urine,noirmal b12 and folate. Celiac serology negative.   05/12/18: EGD: gastritis on biopsy, colonoscopy 5 small polyps resected-tubular adenomas.    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   Current Outpatient Medications  Medication Sig Dispense Refill  . acetaminophen (TYLENOL) 500 MG tablet Take 1,000 mg by mouth every 6 (six) hours as needed (for pain.).    Marland Kitchen apixaban (ELIQUIS) 5 MG TABS tablet Take 1 tablet (5 mg total) by mouth 2 (two) times daily. 180 tablet 1  . aspirin 81 MG EC tablet Take 1 tablet (81 mg total) by mouth daily. 90 tablet 4  . Biotin (BIOTIN 5000) 5 MG CAPS Take 1 capsule by mouth daily.    . cholecalciferol (VITAMIN D) 1000 units tablet Take 1,000 Units by mouth daily.    Marland Kitchen docusate sodium (COLACE) 100 MG capsule Take 100 mg by mouth 2 (two) times daily as needed for mild constipation.    . empagliflozin (JARDIANCE) 10 MG TABS tablet Take 5 mg by mouth daily. 90 tablet 1  . ezetimibe-simvastatin (VYTORIN) 10-40 MG tablet Take 1 tablet by mouth daily at 6 PM. 90 tablet 3  . ferrous sulfate (FERROUSUL) 325 (65 FE) MG tablet Take 1 tablet (325 mg total) by mouth daily with breakfast. 90 tablet 3  . gabapentin (NEURONTIN) 400 MG capsule  TAKE 1 CAPSULE (400 MG TOTAL) BY MOUTH 3 (THREE) TIMES DAILY. 90 capsule 1  . hydrochlorothiazide (MICROZIDE) 12.5 MG capsule Take 1 capsule (12.5 mg total) by mouth daily. 90 capsule 3  . metFORMIN (GLUCOPHAGE) 1000 MG tablet Take 1 tablet (1,000 mg total) by mouth 2 (two) times daily with a meal. 180 tablet 3  . metoprolol tartrate (LOPRESSOR) 25 MG tablet Take 1 tablet (25 mg total) by mouth 2 (two) times daily. 180 tablet 3  . Multiple Vitamin (MULTIVITAMIN WITH MINERALS) TABS tablet Take 1 tablet by mouth daily. 30 tablet 0  . oxyCODONE-acetaminophen (PERCOCET/ROXICET) 5-325 MG tablet One to Two Tabs Every Six Hours As Needed For Pain 50 tablet 0  . silver sulfADIAZINE (SILVADENE) 1 % cream Apply 1 application topically daily. Apply to stump daily. 50 g 5  . vitamin C (ASCORBIC ACID) 500 MG tablet Take 500 mg by mouth daily.     No current facility-administered medications for this visit.     Allergies as of 06/27/2018 - Review Complete 06/24/2018  Allergen Reaction Noted  . Sodium pentobarbital [pentobarbital] Shortness Of Breath 01/13/2018  . Lipitor [atorvastatin] Rash 03/15/2015    ROS:  General: Negative for anorexia, weight loss, fever, chills, fatigue, weakness. ENT: Negative for hoarseness, difficulty swallowing , nasal congestion. CV: Negative for chest pain, angina, palpitations, dyspnea  on exertion, peripheral edema.  Respiratory: Negative for dyspnea at rest, dyspnea on exertion, cough, sputum, wheezing.  GI: See history of present illness. GU:  Negative for dysuria, hematuria, urinary incontinence, urinary frequency, nocturnal urination.  Endo: Negative for unusual weight change.    Physical Examination:   There were no vitals taken for this visit.  General: Well-nourished, well-developed in no acute distress.  Eyes: No icterus. Conjunctivae pink. Mouth: Oropharyngeal mucosa moist and pink , no lesions erythema or exudate. Lungs: Clear to auscultation bilaterally.  Non-labored. Heart: Regular rate and rhythm, no murmurs rubs or gallops.  Abdomen: Bowel sounds are normal, nontender, nondistended, no hepatosplenomegaly or masses, no abdominal bruits or hernia , no rebound or guarding.   Extremities: No lower extremity edema. No clubbing or deformities. Neuro: Alert and oriented x 3.  Grossly intact. Skin: Warm and dry, no jaundice.   Psych: Alert and cooperative, normal mood and affect.   Imaging Studies: Vas [place] Lower Extremity Arterial Duplex  Result Date: 06/24/2018 LOWER EXTREMITY ARTERIAL DUPLEX STUDY Indications: Gangrene, peripheral artery disease, and 06/02/2018 - Right BKA.  Vascular Interventions: 10/25/17: Bilateral CIA stents with right external iliac                         artery PTA. Current ABI:            N/A Limitations: Checking Right leg for flow to stump end.              Slow healing post op Comparison Study: none Performing Technologist: Concha [place] RVT  Examination Guidelines: A complete evaluation includes B-mode imaging, spectral Doppler, color Doppler, and power Doppler as needed of all accessible portions of each vessel. Bilateral testing is considered an integral part of a complete examination. Limited examinations for reoccurring indications may be performed as noted.  Right Duplex Findings: +----------+--------+-----+--------+---------+--------+           PSV cm/sRatioStenosisWaveform Comments +----------+--------+-----+--------+---------+--------+ EIA Distal95                   triphasic         +----------+--------+-----+--------+---------+--------+ CFA Mid   98                   triphasic         +----------+--------+-----+--------+---------+--------+ DFA       84                   biphasic          +----------+--------+-----+--------+---------+--------+ SFA Prox  70                   triphasic         +----------+--------+-----+--------+---------+--------+ SFA Mid   58                    triphasic         +----------+--------+-----+--------+---------+--------+ SFA Distal53                   triphasic         +----------+--------+-----+--------+---------+--------+ POP Prox  21                   triphasic         +----------+--------+-----+--------+---------+--------+  Summary: Right: Normal triphasic flow to stump end proximal popliteal artery. No plaque seen.  See table(s) above for measurements and observations. Electronically signed by Leotis Pain MD on 06/24/2018 at 2:02:46 PM.  Final     Assessment and Plan:   Stepen Prins. is a 62 y.o. y/o male for iron deficiency anemia. No overt blood loss. EGD+colonoscopy showed no abnormality to explain the anemia . On IV iron.  Hb 06/13/18 : 10 grams.   Plan  1. Capsule study of the small bowel    Risks, benefits, alternatives of Givens capsule discussed with patient to include but not limited to the rare risk of Given's capsule becoming lodged in the GI tract requiring surgical removal.  The patient agrees with this plan & consent will be obtained.    Dr [name]  MD,MRCP [facility]) Follow up in 6 weeks

## 2018-06-27 NOTE — ED Provider Notes (Signed)
[facility] Emergency Department Provider Note  ____________________________________________  Time seen: Approximately 5:08 PM  I have reviewed the triage vital signs and the nursing notes.   HISTORY  Chief Complaint Fall    HPI Glenn Lombardo Boney [name] is a 62 y.o. male who presents the emergency department for wound check of his below-knee amputation.  Patient had a below-knee amputation on the right complications from diabetes.  Patient had surgery on 06/02/2018.  Today, patient was ambulating on his walker, when he tripped with his left lower extremity, causing him to fall.  Patient reports that he fell landing on his right "stump".  Patient has had a serosanguineous drainage from the site but no active bleeding.  No significant edema or bruising.  Patient does report an increase in pain to surgical site.  Patient has had some complications with surgical site given his diabetes status but is being followed by vascular surgery for same.  Patient "just wants to make sure it is okay."  Patient reports that he is on gabapentin for neuropathy and to prevent phantom pain.  He is requesting pain medicine due to trauma to surgical site.    Past Medical History:  Diagnosis Date  . Anemia   . Atrial fibrillation ([place])   . Blind    right eye  . Diabetes mellitus with complication ([place])   . Dilated aortic root ([place])    a. 04/2018 Echo: 4.1cm. Asc Ao 3.5cm.  Marland Kitchen Dysrhythmia   . History of echocardiogram    a. 04/2018 Echo: Ef 60-65%, no rwma, midly to mod dil Ao root - 4.1cm. Asc Ao 3.5cm. Mild MR. Nl RV fxn. Nl PASP.  Marland Kitchen History of hernia repair   . History of stress test    a. 2016 MV ([name]): EF 58%, no ischemia.  . Hyperlipidemia   . Hypertension   . Leg pain   . Legally blind   . PAD (peripheral artery disease) ([place])   . PAF (paroxysmal atrial fibrillation) ([name])    a. on Eliquis as of 2018; b. CHADS2VASc => 5 (HTN, DM, stroke x 2, vascular disease)  . Peripheral  vascular disease ([place])    a. followed by Dr. Lucky Cowboy; b. s/p kissing balloon stents and right external iliac stent in 10/2017; c. LE angiogrpahy 01/2018: No significant arterial occlusive disease in the lower extremities.  . Pulmonary nodules   . Stroke [facility])    a. 2016 & 2018    Patient Active Problem List   Diagnosis Date Noted  . Hx of BKA, right ([place]) 06/16/2018  . S/P bilateral BKA (below knee amputation) ([place]) 06/11/2018  . Gangrene of right foot ([place]) 06/02/2018  . Iron deficiency anemia 05/11/2018  . Atherosclerosis of native arteries of the extremities with gangrene ([place]) 05/08/2018  . Necrotic toes ([place]) 02/21/2018  . A-fib ([place]) 08/20/2017  . PVD (peripheral vascular disease) ([place]) 07/15/2017  . Memory loss 09/27/2015  . 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 Diabetic peripheral neuropathy ([place]) 05/16/2015  . History of stroke with residual deficit 04/01/2015  . Chronic headaches 03/15/2015  . Tobacco abuse 01/24/2015  . Hyperlipidemia 01/21/2015  . 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 AMPUTATION Right 06/02/2018   Procedure: AMPUTATION BELOW KNEE;  Surgeon: [name], MD;  Location: [facility] ORS;  Service: Vascular;  Laterality: Right;  . APPENDECTOMY    . COLONOSCOPY WITH PROPOFOL N/A 05/12/2018  Procedure: COLONOSCOPY WITH PROPOFOL;  Surgeon: [name], MD;  Location: [facility] ENDOSCOPY;  Service: Gastroenterology;  Laterality: N/A;  . ESOPHAGOGASTRODUODENOSCOPY (EGD) WITH PROPOFOL N/A 05/12/2018   Procedure: ESOPHAGOGASTRODUODENOSCOPY (EGD) WITH PROPOFOL;  Surgeon: [name], MD;  Location: [facility] ENDOSCOPY;  Service: Gastroenterology;  Laterality: N/A;  . HERNIA REPAIR     UMBILICAL  . LOWER EXTREMITY ANGIOGRAPHY Right 10/25/2017   Procedure: LOWER EXTREMITY ANGIOGRAPHY;  Surgeon: [name], MD;  Location: [place] CV LAB;  Service:  Cardiovascular;  Laterality: Right;  . LOWER EXTREMITY ANGIOGRAPHY Right 01/13/2018   Procedure: LOWER EXTREMITY ANGIOGRAPHY;  Surgeon: [name], MD;  Location: [place] CV LAB;  Service: Cardiovascular;  Laterality: Right;  . LOWER EXTREMITY INTERVENTION  10/25/2017   Procedure: LOWER EXTREMITY INTERVENTION;  Surgeon: [name], MD;  Location: [place] CV LAB;  Service: Cardiovascular;;  . TONSILLECTOMY      Prior to Admission medications   Medication Sig Start Date End Date Taking? Authorizing Provider  acetaminophen (TYLENOL) 500 MG tablet Take 1,000 mg by mouth every 6 (six) hours as needed (for pain.).    [provider]  apixaban (ELIQUIS) 5 MG TABS tablet Take 1 tablet (5 mg total) by mouth 2 (two) times daily. 05/06/18   Johnson, Megan P, DO  aspirin 81 MG EC tablet Take 1 tablet (81 mg total) by mouth daily. 02/21/18   Johnson, Megan P, DO  Biotin (BIOTIN 5000) 5 MG CAPS Take 1 capsule by mouth daily.    [provider]  cholecalciferol (VITAMIN D) 1000 units tablet Take 1,000 Units by mouth daily.    [provider]  docusate sodium (COLACE) 100 MG capsule Take 100 mg by mouth 2 (two) times daily as needed for mild constipation.    [provider]  empagliflozin (JARDIANCE) 10 MG TABS tablet Take 5 mg by mouth daily. 05/06/18   Johnson, Megan P, DO  ezetimibe-simvastatin (VYTORIN) 10-40 MG tablet Take 1 tablet by mouth daily at 6 PM. 02/14/18   Gollan, Kathlene November, MD  ferrous sulfate (FERROUSUL) 325 (65 FE) MG tablet Take 1 tablet (325 mg total) by mouth daily with breakfast. 04/01/18   Wynetta Emery, Megan P, DO  gabapentin (NEURONTIN) 400 MG capsule TAKE 1 CAPSULE (400 MG TOTAL) BY MOUTH 3 (THREE) TIMES DAILY. 05/20/18   Johnson, Megan P, DO  hydrochlorothiazide (MICROZIDE) 12.5 MG capsule Take 1 capsule (12.5 mg total) by mouth daily. 04/06/18 07/05/18  Rise Mu, PA-C  metFORMIN (GLUCOPHAGE) 1000 MG tablet Take 1 tablet (1,000 mg total) by  mouth 2 (two) times daily with a meal. 05/06/18   Johnson, Megan P, DO  metoprolol tartrate (LOPRESSOR) 25 MG tablet Take 1 tablet (25 mg total) by mouth 2 (two) times daily. 04/11/18   Rise Mu, PA-C  Multiple Vitamin (MULTIVITAMIN WITH MINERALS) TABS tablet Take 1 tablet by mouth daily. 06/24/17   Fritzi Mandes, MD  oxyCODONE-acetaminophen (PERCOCET/ROXICET) 5-325 MG tablet One to Two Tabs Every Six Hours As Needed For Pain 06/06/18   Stegmayer, Janalyn Harder, PA-C  oxyCODONE-acetaminophen (PERCOCET/ROXICET) 5-325 MG tablet Take 1 tablet by mouth every 6 (six) hours as needed for severe pain. 06/27/18   , Charline Bills, PA-C  silver sulfADIAZINE (SILVADENE) 1 % cream Apply 1 application topically daily. Apply to stump daily. 06/22/18   Stegmayer, Joelene Millin A, PA-C  vitamin C (ASCORBIC ACID) 500 MG tablet Take 500 mg by mouth daily.    [provider]    Allergies Sodium  pentobarbital [pentobarbital] and Lipitor [atorvastatin]  Family History  Problem Relation Age of Onset  . Diabetes Father   . Hypertension Father   . Hyperlipidemia Father     Social History Social History   Tobacco Use  . Smoking status: Former Smoker    Packs/day: 1.00    Years: 30.00    Pack years: 30.00    Types: Cigarettes    Last attempt to quit: 01/09/2018    Years since quitting: 0.4  . Smokeless tobacco: Never Used  Substance Use Topics  . Alcohol use: Not Currently    Alcohol/week: 8.0 standard drinks    Types: 8 Cans of beer per week    Comment: occasionally beer   . Drug use: No     Review of Systems  Constitutional: No fever/chills Eyes: No visual changes. Cardiovascular: no chest pain. Respiratory: no cough. No SOB. Gastrointestinal: No abdominal pain.  No nausea, no vomiting.   Musculoskeletal: Injury to right side below-knee amputation surgical site. Skin: Negative for rash, abrasions, lacerations, ecchymosis. Neurological: Negative for headaches, focal weakness or  numbness. 10-point ROS otherwise negative.  ____________________________________________   PHYSICAL EXAM:  VITAL SIGNS: ED Triage Vitals [06/27/18 1632]  Enc Vitals Group     BP 126/75     Pulse Rate (!) 104     Resp 17     Temp 97.9 F (36.6 C)     Temp Source Oral     SpO2 98 %     Weight 180 lb (81.6 kg)     Height 5\' 9"  (1.753 m)     Head Circumference      Peak Flow      Pain Score 0     Pain Loc      Pain Edu?      Excl. in [place]?      Constitutional: Alert and oriented. Well appearing and in no acute distress. Eyes: Conjunctivae are normal. PERRL. EOMI. Head: Atraumatic. Neck: No stridor.    Cardiovascular: Normal rate, regular rhythm. Normal S1 and S2.  Good peripheral circulation. Respiratory: Normal respiratory effort without tachypnea or retractions. Lungs CTAB. Good air entry to the bases with no decreased or absent breath sounds. Musculoskeletal: Full range of motion to all extremities. No gross deformities appreciated.  Visualization of the right lower extremity reveals below-knee amputation.  Patient with in place staples.  Ecchymosis to the region, appears consistent with previous documented ecchymosis.  Patient does have 2 drops of serosanguineous fluid along surgical site.  No active bleeding.  No purulent drainage.  Palpation over surgical site reveals no dehiscence.  No palpable abnormality.  No tenderness to palpation.  Examination of the right knee is unremarkable. Neurologic:  Normal speech and language. No gross focal neurologic deficits are appreciated.  Skin:  Skin is warm, dry and intact. No rash noted. Psychiatric: Mood and affect are normal. Speech and behavior are normal. Patient exhibits appropriate insight and judgement.   ____________________________________________   LABS (all labs ordered are listed, but only abnormal results are displayed)  Labs Reviewed - No data to  display ____________________________________________  EKG   ____________________________________________  RADIOLOGY   No results found.  ____________________________________________    PROCEDURES  Procedure(s) performed:    Procedures    Medications  oxyCODONE-acetaminophen (PERCOCET/ROXICET) 5-325 MG per tablet 1 tablet (1 tablet Oral Given 06/27/18 1722)     ____________________________________________   INITIAL IMPRESSION / ASSESSMENT AND PLAN / ED COURSE  Pertinent labs & imaging results that were available  during my care of the patient were reviewed by me and considered in my medical decision making (see chart for details).  Review of the [place] CSRS was performed in accordance of the [place] prior to dispensing any controlled drugs.      Patient's diagnosis is consistent with wound check.  Patient presents emergency department for evaluation of right-sided below-knee amputation.  Patient had amputation on 06/02/2018.  He has had some surgical site healing difficulties given his diabetic status.  Patient is seeing vascular surgery for this complaint.  Patient sustained a fall landing on his surgical site.  He was concerned as he had some serosanguineous drainage.  2 drops of serosanguineous fluid is appreciated along surgical site.  No dehiscence of site.  No active bleeding.  No abrasions or lacerations.  At this time, exam is reassuring with no indication for work-up.  Patient was requesting pain medicine for injury to surgical site.  Patient is on gabapentin for neuropathy and prevention of phantom pain.  As such, patient will be prescribed a limited prescription of Percocet for injury to surgical site.  Follow-up with vascular surgery for any complications.. Patient is given ED precautions to return to the ED for any worsening or new symptoms.     ____________________________________________  FINAL CLINICAL IMPRESSION(S) / ED DIAGNOSES  Final diagnoses:  Fall in  home, initial encounter  Encounter for examination of surgical site      NEW MEDICATIONS STARTED DURING THIS VISIT:  ED Discharge Orders         Ordered    oxyCODONE-acetaminophen (PERCOCET/ROXICET) 5-325 MG tablet  Every 6 hours PRN     06/27/18 1730              This chart was dictated using voice recognition software/Dragon. Despite best efforts to proofread, errors can occur which can change the meaning. Any change was purely unintentional.    Darletta Moll, PA-C 06/27/18 1731    Lavonia Drafts, MD 06/27/18 1756

## 2018-06-27 NOTE — ED Triage Notes (Signed)
Pt in via POV, pt with recent BKA to right leg, reports mechanical fall onto that leg, reports he just wants to have the limb checked to make sure he didn't do any damage to area.  Incision intact via staples, no bleeding/exudate at this time.  Vitals WDL.  NAD noted.

## 2018-06-27 NOTE — Telephone Encounter (Signed)
Wife called to inform that [name] is at the ER

## 2018-06-27 NOTE — ED Triage Notes (Signed)
First Nurse Note:  S/O left BKA in last Octoboer.  States fell onto stump today.  AAOx3.  Skin warm and dry. NAD

## 2018-06-27 NOTE — ED Notes (Signed)
See triage note   States he had a mechanical fall   States he had a recent BKA  And want the wound checked   Staples intact

## 2018-06-28 ENCOUNTER — Ambulatory Visit (INDEPENDENT_AMBULATORY_CARE_PROVIDER_SITE_OTHER): Payer: Self-pay | Admitting: Nurse Practitioner

## 2018-06-28 ENCOUNTER — Ambulatory Visit (INDEPENDENT_AMBULATORY_CARE_PROVIDER_SITE_OTHER): Payer: Self-pay | Admitting: Vascular Surgery

## 2018-06-28 DIAGNOSIS — I77810 Thoracic aortic ectasia: Secondary | ICD-10-CM | POA: Diagnosis not present

## 2018-06-28 DIAGNOSIS — D649 Anemia, unspecified: Secondary | ICD-10-CM | POA: Diagnosis not present

## 2018-06-28 DIAGNOSIS — I69351 Hemiplegia and hemiparesis following cerebral infarction affecting right dominant side: Secondary | ICD-10-CM | POA: Diagnosis not present

## 2018-06-28 DIAGNOSIS — Z4781 Encounter for orthopedic aftercare following surgical amputation: Secondary | ICD-10-CM | POA: Diagnosis not present

## 2018-06-28 DIAGNOSIS — I10 Essential (primary) hypertension: Secondary | ICD-10-CM | POA: Diagnosis not present

## 2018-06-28 DIAGNOSIS — R911 Solitary pulmonary nodule: Secondary | ICD-10-CM | POA: Diagnosis not present

## 2018-06-28 DIAGNOSIS — I480 Paroxysmal atrial fibrillation: Secondary | ICD-10-CM | POA: Diagnosis not present

## 2018-06-28 DIAGNOSIS — E1152 Type 2 diabetes mellitus with diabetic peripheral angiopathy with gangrene: Secondary | ICD-10-CM | POA: Diagnosis not present

## 2018-06-28 DIAGNOSIS — Z89511 Acquired absence of right leg below knee: Secondary | ICD-10-CM | POA: Diagnosis not present

## 2018-06-28 DIAGNOSIS — H5461 Unqualified visual loss, right eye, normal vision left eye: Secondary | ICD-10-CM | POA: Diagnosis not present

## 2018-06-29 DIAGNOSIS — Z4781 Encounter for orthopedic aftercare following surgical amputation: Secondary | ICD-10-CM | POA: Diagnosis not present

## 2018-06-29 DIAGNOSIS — R911 Solitary pulmonary nodule: Secondary | ICD-10-CM | POA: Diagnosis not present

## 2018-06-29 DIAGNOSIS — I480 Paroxysmal atrial fibrillation: Secondary | ICD-10-CM | POA: Diagnosis not present

## 2018-06-29 DIAGNOSIS — D649 Anemia, unspecified: Secondary | ICD-10-CM | POA: Diagnosis not present

## 2018-06-29 DIAGNOSIS — I10 Essential (primary) hypertension: Secondary | ICD-10-CM | POA: Diagnosis not present

## 2018-06-29 DIAGNOSIS — Z89511 Acquired absence of right leg below knee: Secondary | ICD-10-CM | POA: Diagnosis not present

## 2018-06-29 DIAGNOSIS — H5461 Unqualified visual loss, right eye, normal vision left eye: Secondary | ICD-10-CM | POA: Diagnosis not present

## 2018-06-29 DIAGNOSIS — I69351 Hemiplegia and hemiparesis following cerebral infarction affecting right dominant side: Secondary | ICD-10-CM | POA: Diagnosis not present

## 2018-06-29 DIAGNOSIS — E1152 Type 2 diabetes mellitus with diabetic peripheral angiopathy with gangrene: Secondary | ICD-10-CM | POA: Diagnosis not present

## 2018-06-29 DIAGNOSIS — I77810 Thoracic aortic ectasia: Secondary | ICD-10-CM | POA: Diagnosis not present

## 2018-07-04 DIAGNOSIS — I69351 Hemiplegia and hemiparesis following cerebral infarction affecting right dominant side: Secondary | ICD-10-CM | POA: Diagnosis not present

## 2018-07-04 DIAGNOSIS — Z89511 Acquired absence of right leg below knee: Secondary | ICD-10-CM | POA: Diagnosis not present

## 2018-07-04 DIAGNOSIS — I10 Essential (primary) hypertension: Secondary | ICD-10-CM | POA: Diagnosis not present

## 2018-07-04 DIAGNOSIS — H5461 Unqualified visual loss, right eye, normal vision left eye: Secondary | ICD-10-CM | POA: Diagnosis not present

## 2018-07-04 DIAGNOSIS — E1152 Type 2 diabetes mellitus with diabetic peripheral angiopathy with gangrene: Secondary | ICD-10-CM | POA: Diagnosis not present

## 2018-07-04 DIAGNOSIS — D649 Anemia, unspecified: Secondary | ICD-10-CM | POA: Diagnosis not present

## 2018-07-04 DIAGNOSIS — R911 Solitary pulmonary nodule: Secondary | ICD-10-CM | POA: Diagnosis not present

## 2018-07-04 DIAGNOSIS — I77810 Thoracic aortic ectasia: Secondary | ICD-10-CM | POA: Diagnosis not present

## 2018-07-04 DIAGNOSIS — Z4781 Encounter for orthopedic aftercare following surgical amputation: Secondary | ICD-10-CM | POA: Diagnosis not present

## 2018-07-04 DIAGNOSIS — I480 Paroxysmal atrial fibrillation: Secondary | ICD-10-CM | POA: Diagnosis not present

## 2018-07-05 ENCOUNTER — Encounter: Payer: Self-pay | Admitting: Family Medicine

## 2018-07-05 ENCOUNTER — Telehealth (INDEPENDENT_AMBULATORY_CARE_PROVIDER_SITE_OTHER): Payer: Self-pay

## 2018-07-05 NOTE — Telephone Encounter (Signed)
Nurse called to get a verbal order for this patient to have [facility] speech therapy for once a week for one week, and then for 4 weeks for 2 times a week.  I will call the nurse back with the verbal if you agree that he can have this done.

## 2018-07-06 NOTE — Telephone Encounter (Signed)
The number is saying that it is not a working number, so I have not been able to relay the message to the OT specialist.

## 2018-07-06 NOTE — Telephone Encounter (Signed)
Tried calling the nurse back to let her know that the doctor agrees with the therapy that she has requested for but I keep getting a busy signal and I have not been able to leave a message.

## 2018-07-07 DIAGNOSIS — R4189 Other symptoms and signs involving cognitive functions and awareness: Secondary | ICD-10-CM | POA: Diagnosis not present

## 2018-07-07 DIAGNOSIS — R69 Illness, unspecified: Secondary | ICD-10-CM | POA: Diagnosis not present

## 2018-07-07 DIAGNOSIS — E1142 Type 2 diabetes mellitus with diabetic polyneuropathy: Secondary | ICD-10-CM | POA: Diagnosis not present

## 2018-07-07 DIAGNOSIS — Z89511 Acquired absence of right leg below knee: Secondary | ICD-10-CM | POA: Diagnosis not present

## 2018-07-07 DIAGNOSIS — H53461 Homonymous bilateral field defects, right side: Secondary | ICD-10-CM | POA: Diagnosis not present

## 2018-07-08 ENCOUNTER — Encounter (INDEPENDENT_AMBULATORY_CARE_PROVIDER_SITE_OTHER): Payer: Self-pay | Admitting: Nurse Practitioner

## 2018-07-08 ENCOUNTER — Ambulatory Visit (INDEPENDENT_AMBULATORY_CARE_PROVIDER_SITE_OTHER): Payer: Medicare HMO | Admitting: Nurse Practitioner

## 2018-07-08 VITALS — BP 113/69 | HR 87 | Ht 69.0 in | Wt 185.0 lb

## 2018-07-08 DIAGNOSIS — R69 Illness, unspecified: Secondary | ICD-10-CM | POA: Diagnosis not present

## 2018-07-08 DIAGNOSIS — R413 Other amnesia: Secondary | ICD-10-CM

## 2018-07-08 DIAGNOSIS — E782 Mixed hyperlipidemia: Secondary | ICD-10-CM

## 2018-07-08 DIAGNOSIS — Z89511 Acquired absence of right leg below knee: Secondary | ICD-10-CM

## 2018-07-08 DIAGNOSIS — I739 Peripheral vascular disease, unspecified: Secondary | ICD-10-CM

## 2018-07-08 MED ORDER — DOXYCYCLINE HYCLATE 100 MG PO CAPS: 100.0000 mg | ORAL_CAPSULE | Freq: Two times a day (BID) | ORAL | 0 refills | Status: DC

## 2018-07-09 DIAGNOSIS — Z89511 Acquired absence of right leg below knee: Secondary | ICD-10-CM | POA: Diagnosis not present

## 2018-07-09 DIAGNOSIS — I96 Gangrene, not elsewhere classified: Secondary | ICD-10-CM | POA: Diagnosis not present

## 2018-07-11 ENCOUNTER — Other Ambulatory Visit: Payer: Self-pay

## 2018-07-11 ENCOUNTER — Encounter: Payer: Self-pay | Admitting: Nurse Practitioner

## 2018-07-11 ENCOUNTER — Ambulatory Visit: Payer: Medicare HMO | Attending: Nurse Practitioner | Admitting: Nurse Practitioner

## 2018-07-11 VITALS — BP 124/86 | HR 100 | Temp 97.7°F | Resp 18 | Ht 69.0 in | Wt 185.0 lb

## 2018-07-11 DIAGNOSIS — E1142 Type 2 diabetes mellitus with diabetic polyneuropathy: Secondary | ICD-10-CM | POA: Insufficient documentation

## 2018-07-11 DIAGNOSIS — Z8673 Personal history of transient ischemic attack (TIA), and cerebral infarction without residual deficits: Secondary | ICD-10-CM | POA: Diagnosis not present

## 2018-07-11 DIAGNOSIS — I739 Peripheral vascular disease, unspecified: Secondary | ICD-10-CM | POA: Insufficient documentation

## 2018-07-11 DIAGNOSIS — G894 Chronic pain syndrome: Secondary | ICD-10-CM | POA: Diagnosis not present

## 2018-07-11 DIAGNOSIS — Z79899 Other long term (current) drug therapy: Secondary | ICD-10-CM | POA: Diagnosis not present

## 2018-07-11 DIAGNOSIS — I480 Paroxysmal atrial fibrillation: Secondary | ICD-10-CM | POA: Diagnosis not present

## 2018-07-11 DIAGNOSIS — R911 Solitary pulmonary nodule: Secondary | ICD-10-CM | POA: Diagnosis not present

## 2018-07-11 DIAGNOSIS — D649 Anemia, unspecified: Secondary | ICD-10-CM | POA: Diagnosis not present

## 2018-07-11 DIAGNOSIS — Z89511 Acquired absence of right leg below knee: Secondary | ICD-10-CM

## 2018-07-11 DIAGNOSIS — Z4781 Encounter for orthopedic aftercare following surgical amputation: Secondary | ICD-10-CM | POA: Diagnosis not present

## 2018-07-11 DIAGNOSIS — E785 Hyperlipidemia, unspecified: Secondary | ICD-10-CM | POA: Insufficient documentation

## 2018-07-11 DIAGNOSIS — G8929 Other chronic pain: Secondary | ICD-10-CM

## 2018-07-11 DIAGNOSIS — I10 Essential (primary) hypertension: Secondary | ICD-10-CM | POA: Diagnosis not present

## 2018-07-11 DIAGNOSIS — D509 Iron deficiency anemia, unspecified: Secondary | ICD-10-CM | POA: Diagnosis not present

## 2018-07-11 DIAGNOSIS — Z789 Other specified health status: Secondary | ICD-10-CM | POA: Diagnosis not present

## 2018-07-11 DIAGNOSIS — F17210 Nicotine dependence, cigarettes, uncomplicated: Secondary | ICD-10-CM | POA: Diagnosis not present

## 2018-07-11 DIAGNOSIS — E1152 Type 2 diabetes mellitus with diabetic peripheral angiopathy with gangrene: Secondary | ICD-10-CM | POA: Diagnosis not present

## 2018-07-11 DIAGNOSIS — I679 Cerebrovascular disease, unspecified: Secondary | ICD-10-CM | POA: Insufficient documentation

## 2018-07-11 DIAGNOSIS — H5461 Unqualified visual loss, right eye, normal vision left eye: Secondary | ICD-10-CM | POA: Diagnosis not present

## 2018-07-11 DIAGNOSIS — I639 Cerebral infarction, unspecified: Secondary | ICD-10-CM | POA: Insufficient documentation

## 2018-07-11 DIAGNOSIS — M899 Disorder of bone, unspecified: Secondary | ICD-10-CM | POA: Diagnosis not present

## 2018-07-11 DIAGNOSIS — I77810 Thoracic aortic ectasia: Secondary | ICD-10-CM | POA: Diagnosis not present

## 2018-07-11 DIAGNOSIS — R69 Illness, unspecified: Secondary | ICD-10-CM | POA: Diagnosis not present

## 2018-07-11 DIAGNOSIS — I69351 Hemiplegia and hemiparesis following cerebral infarction affecting right dominant side: Secondary | ICD-10-CM | POA: Diagnosis not present

## 2018-07-11 DIAGNOSIS — M79604 Pain in right leg: Secondary | ICD-10-CM

## 2018-07-11 HISTORY — DX: Cerebral infarction, unspecified: I63.9

## 2018-07-11 NOTE — Patient Instructions (Signed)

## 2018-07-11 NOTE — Progress Notes (Signed)
Patient's Name: [name].  MRN: [id number]  Referring Provider: [name], MD  DOB: 01/30/1956  PCP: [name], DO  DOS: 07/11/2018  Note by: Dionisio David NP  Service setting: Ambulatory outpatient  Specialty: Interventional Pain Management  Location: [facility] (AMB) Pain Management Facility    Patient type: New Patient    Primary Reason(s) for Visit: Initial Patient Evaluation CC: Leg Pain (BKA 06/02/2018- stump pain)  HPI  Mr. Hart is a 62 y.o. year old, male patient, who comes today for an initial evaluation. He has Hyperlipidemia; Benign hypertensive renal disease; Tobacco abuse; Chronic headaches; History of stroke with residual deficit; Right homonymous hemianopsia; Memory loss; Alcoholic peripheral neuropathy ([place]); Diabetes mellitus type 2, uncomplicated ([place]); Diabetic peripheral neuropathy ([place]); Hypertension; PVD (peripheral vascular disease) ([place]); A-fib ([place]); Necrotic toes ([place]); Atherosclerosis of native arteries of the extremities with gangrene ([place]); Iron deficiency anemia; Gangrene of right foot ([name]); S/P bilateral BKA (below knee amputation) ([place]); Hx of BKA, right ([place]); Acute left PCA stroke ([place]); Stroke [facility]); Chronic pain of right lower extremity; Chronic pain syndrome; Pharmacologic therapy; Disorder of skeletal system; and Problems influencing health status on their problem list.. His primarily concern today is the Leg Pain (BKA 06/02/2018- stump pain)  Pain Assessment: Location: Right Leg(BKA stump pain) Quality: Constant, Aching, Shooting, Tender Severity: 8 /10 (subjective, self-reported pain score)  Note: Reported level is compatible with observation. Clinically the patient looks like a 1/10       Information on the proper use of the pain scale provided to the patient today. When using our objective Pain Scale, levels between 6 and 10/10 are said to belong in an emergency room, as it progressively worsens from a 6/10, described as severely limiting,  requiring emergency care not usually available at an outpatient pain management facility. At a 6/10 level, communication becomes difficult and requires great effort. Assistance to reach the emergency department may be required. Facial flushing and profuse sweating along with potentially dangerous increases in heart rate and blood pressure will be evident. BP: 124/86  HR: 100  Onset and Duration: Date of onset: 06/02/2018 Cause of pain: BKA 06-02-2018 right Severity: No change since onset, NAS-11 at its worse: 8/10, NAS-11 at its best: 5/10, NAS-11 now: 7/10 and NAS-11 on the average: 5/10 Timing: Not influenced by the time of the day Aggravating Factors: Motion, Prolonged sitting and Prolonged standing Alleviating Factors: Medications, Standing and PT Associated Problems: Color changes, Day-time cramps, Night-time cramps, Inability to concentrate, Pain that wakes patient up and Pain that does not allow patient to sleep Quality of Pain: Aching, Agonizing, Burning, Intermittent, Cramping, Deep, Disabling, Distressing, Dreadful, Dull, Exhausting, Horrible, Sharp, Shooting, Throbbing and Uncomfortable Previous Examinations or Tests: Endoscopy and Neurosurgical evaluation Previous Treatments: Narcotic medications and Physical Therapy  The patient comes into the clinics today for the first time for a chronic pain management evaluation.  I according to the patient his primary area of pain is in his right lower leg.  He is status post right below the knee amputation on June 02, 2018.  He is currently in physical therapy.  He feels like this makes his pain worse.  Today I took the time to provide the patient with information regarding this pain practice. The patient was informed that the practice is divided into two sections: an interventional pain management section, as well as a completely separate and distinct medication management section. I explained that there are procedure days for interventional  therapies, and evaluation  days for follow-ups and medication management. Because of the amount of documentation required during both, they are kept separated. This means that there is the possibility that he may be scheduled for a procedure on one day, and medication management the next. I have also informed him that because of staffing and facility limitations, this practice will no longer take patients for medication management only. To illustrate the reasons for this, I gave the patient the example of surgeons, and how inappropriate it would be to refer a patient to his/her care, just to write for the post-surgical antibiotics on a surgery done by a different surgeon.   Because interventional pain management is part of the board-certified specialty for the doctors, the patient was informed that joining this practice means that they are open to any and all interventional therapies. I made it clear that this does not mean that they will be forced to have any procedures done. What this means is that I believe interventional therapies to be essential part of the diagnosis and proper management of chronic pain conditions. Therefore, patients not interested in these interventional alternatives will be better served under the care of a different practitioner.  The patient was also made aware of my Comprehensive Pain Management Safety Guidelines where by joining this practice, they limit all of their nerve blocks and joint injections to those done by our practice, for as long as we are retained to manage their care. Historic Controlled Substance Pharmacotherapy Review  PMP and historical list of controlled substances: Oxycodone/acetaminophen 5/325, oxycodone/acetaminophen 10/325 mg, OxyContin ER 10 mg, tramadol 50 mg, hydromorphone 2 mg, lorazepam 1 mg, Highest opioid analgesic regimen found: Oxycodone/acetaminophen 10/325 1 tablet 3 times daily (fill date 05/10/2018) oxycodone 10 mg/day Most recent opioid  analgesic: Oxycodone/acetaminophen 5/325 mg 1 tablet 3 times daily (fill date 06/27/2018) oxycodone 15 mg/day Current opioid analgesics: None Highest recorded MME/day: 60 mg/day MME/day: 0 mg/day Medications: The patient did not bring the medication(s) to the appointment, as requested in our "New Patient Package" Pharmacodynamics: Desired effects: Analgesia: The patient reports >50% benefit. Reported improvement in function: The patient reports medication allows him to accomplish basic ADLs. Clinically meaningful improvement in function (CMIF): Sustained CMIF goals met Perceived effectiveness: Described as relatively effective, allowing for increase in activities of daily living (ADL) Undesirable effects: Side-effects or Adverse reactions: None reported Historical Monitoring: The patient  reports that he does not use drugs. List of all UDS Test(s): Lab Results  Component Value Date   MDMA NONE DETECTED 06/21/2017   COCAINSCRNUR NONE DETECTED 06/21/2017   PCPSCRNUR NONE DETECTED 06/21/2017   THCU NONE DETECTED 06/21/2017   ETH <10 06/21/2017   List of all Serum Drug Screening Test(s):  No results found for: AMPHSCRSER, BARBSCRSER, BENZOSCRSER, COCAINSCRSER, PCPSCRSER, PCPQUANT, THCSCRSER, CANNABQUANT, OPIATESCRSER, OXYSCRSER, PROPOXSCRSER Historical Background Evaluation: [place] PDMP: Six (6) year initial data search conducted.             [place] Department of public safety, offender search: Editor, commissioning Information) Non-contributory Risk Assessment Profile: Aberrant behavior: None observed or detected today Risk factors for fatal opioid overdose: caucasian and history of alcoholism Fatal overdose hazard ratio (HR): Calculation deferred Non-fatal overdose hazard ratio (HR): Calculation deferred Risk of opioid abuse or dependence: 0.7-3.0% with doses ? 36 MME/day and 6.1-26% with doses ? 120 MME/day. Substance use disorder (SUD) risk level: Pending results of Medical Psychology Evaluation for  SUD Opioid risk tool (ORT) (Total Score): 3  ORT Scoring interpretation table:  Score <3 = Low Risk  for SUD  Score between 4-7 = Moderate Risk for SUD  Score >8 = High Risk for Opioid Abuse   PHQ-2 Depression Scale:  Total score: 0  PHQ-2 Scoring interpretation table: (Score and probability of major depressive disorder)  Score 0 = No depression  Score 1 = 15.4% Probability  Score 2 = 21.1% Probability  Score 3 = 38.4% Probability  Score 4 = 45.5% Probability  Score 5 = 56.4% Probability  Score 6 = 78.6% Probability   PHQ-9 Depression Scale:  Total score: 0  PHQ-9 Scoring interpretation table:  Score 0-4 = No depression  Score 5-9 = Mild depression  Score 10-14 = Moderate depression  Score 15-19 = Moderately severe depression  Score 20-27 = Severe depression (2.4 times higher risk of SUD and 2.89 times higher risk of overuse)   Pharmacologic Plan: Pending ordered tests and/or consults  Meds  The patient has a current medication list which includes the following prescription(s): acetaminophen, apixaban, aspirin, biotin, doxycycline, empagliflozin, ezetimibe-simvastatin, ferrous sulfate, gabapentin, hydrochlorothiazide, metformin, metoprolol tartrate, multivitamin with minerals, silver sulfadiazine, and vitamin c.  Current Outpatient Medications on File Prior to Visit  Medication Sig  . acetaminophen (TYLENOL) 500 MG tablet Take 1,000 mg by mouth every 6 (six) hours as needed (for pain.).  Marland Kitchen apixaban (ELIQUIS) 5 MG TABS tablet Take 1 tablet (5 mg total) by mouth 2 (two) times daily.  Marland Kitchen aspirin 81 MG EC tablet Take 1 tablet (81 mg total) by mouth daily.  . Biotin (BIOTIN 5000) 5 MG CAPS Take 1 capsule by mouth daily.  Marland Kitchen doxycycline (VIBRAMYCIN) 100 MG capsule Take 1 capsule (100 mg total) by mouth 2 (two) times daily.  . empagliflozin (JARDIANCE) 10 MG TABS tablet Take 5 mg by mouth daily.  Marland Kitchen ezetimibe-simvastatin (VYTORIN) 10-40 MG tablet Take 1 tablet by mouth daily at 6 PM.   . ferrous sulfate (FERROUSUL) 325 (65 FE) MG tablet Take 1 tablet (325 mg total) by mouth daily with breakfast.  . gabapentin (NEURONTIN) 400 MG capsule TAKE 1 CAPSULE (400 MG TOTAL) BY MOUTH 3 (THREE) TIMES DAILY. (Patient taking differently: Take 800 mg by mouth 3 (three) times daily. )  . hydrochlorothiazide (MICROZIDE) 12.5 MG capsule Take 1 capsule (12.5 mg total) by mouth daily.  . metFORMIN (GLUCOPHAGE) 1000 MG tablet Take 1 tablet (1,000 mg total) by mouth 2 (two) times daily with a meal.  . metoprolol tartrate (LOPRESSOR) 25 MG tablet Take 1 tablet (25 mg total) by mouth 2 (two) times daily.  . Multiple Vitamin (MULTIVITAMIN WITH MINERALS) TABS tablet Take 1 tablet by mouth daily.  . silver sulfADIAZINE (SILVADENE) 1 % cream Apply 1 application topically daily. Apply to stump daily.  . vitamin C (ASCORBIC ACID) 500 MG tablet Take 500 mg by mouth daily.   No current facility-administered medications on file prior to visit.    Imaging Review   Note: No new results found.        ROS  Cardiovascular History: Heart trouble, Abnormal heart rhythm, Daily Aspirin intake, High blood pressure and Blood thinners:  Anticoagulant  Pulmonary or Respiratory History: Smoking Quit 2019 June Neurological History: Stroke (Residual deficits or weakness: 2016) Review of Past Neurological Studies:  Results for orders placed or performed during the hospital encounter of 06/21/17  CT HEAD WO CONTRAST   Narrative   CLINICAL DATA:  62 y/o M; difficulties with memory and complaints of right-sided weakness.  EXAM: CT HEAD WITHOUT CONTRAST  TECHNIQUE: Contiguous axial images were obtained from  the base of the skull through the vertex without intravenous contrast.  COMPARISON:  06/21/2017 and 04/29/2015 CT of the head.  FINDINGS: Brain: Small left precentral gyrus cortical lucency (series 2, image 27) and left medial parietal cortical lucency (series 2, image 24) with increased conspicuity from  prior CT of head compatible with evolving acute infarction. No acute hemorrhage, mass effect, or hydrocephalus. No extra-axial collection or effacement of basilar cisterns. Chronic left occipital infarction.  Vascular: Mild calcific atherosclerosis of carotid siphons. No hyperdense vessel.  Skull: Normal. Negative for fracture or focal lesion.  Sinuses/Orbits: No acute finding.  Other: None.  IMPRESSION: 1. Small cortical infarct in left precentral gyrus and left medial parietal lobe with increased conspicuity, likely representing evolving late acute/early subacute infarctions. 2. No hemorrhage or mass effect. 3. Stable chronic left occipital lobe infarction. These results will be called to the ordering clinician or representative by the Radiologist Assistant, and communication documented in the PACS or zVision Dashboard.   Electronically Signed   By: Kristine Garbe M.D.   On: 06/22/2017 15:38    Psychological-Psychiatric History: No reported psychological or psychiatric signs or symptoms such as difficulty sleeping, anxiety, depression, delusions or hallucinations (schizophrenial), mood swings (bipolar disorders) or suicidal ideations or attempts Gastrointestinal History: No reported gastrointestinal signs or symptoms such as vomiting or evacuating blood, reflux, heartburn, alternating episodes of diarrhea and constipation, inflamed or scarred liver, or pancreas or irrregular and/or infrequent bowel movements Genitourinary History: No reported renal or genitourinary signs or symptoms such as difficulty voiding or producing urine, peeing blood, non-functioning kidney, kidney stones, difficulty emptying the bladder, difficulty controlling the flow of urine, or chronic kidney disease Hematological History: Weakness due to low blood hemoglobin or red blood cell count (Anemia) and Bleeding easily Endocrine History: High blood sugar controlled without the use of insulin  (NIDDM) Rheumatologic History: No reported rheumatological signs and symptoms such as fatigue, joint pain, tenderness, swelling, redness, heat, stiffness, decreased range of motion, with or without associated rash Musculoskeletal History: Negative for myasthenia gravis, muscular dystrophy, multiple sclerosis or malignant hyperthermia Work History: Disabled  Allergies  Mr. Weant is allergic to sodium pentobarbital [pentobarbital] and lipitor [atorvastatin].  Laboratory Chemistry  Inflammation Markers Lab Results  Component Value Date   CRP 6.6 (H) 02/26/2015   ESRSEDRATE 28 (H) 04/29/2015   (CRP: Acute Phase) (ESR: Chronic Phase) Renal Function Markers Lab Results  Component Value Date   BUN 14 06/04/2018   CREATININE 0.70 06/04/2018   GFRAA >60 06/04/2018   GFRNONAA >60 06/04/2018   Hepatic Function Markers Lab Results  Component Value Date   AST 15 05/06/2018   ALT 13 05/06/2018   ALBUMIN 4.2 05/06/2018   ALKPHOS 82 05/06/2018   Electrolytes Lab Results  Component Value Date   NA 137 06/04/2018   K 3.7 06/04/2018   CL 102 06/04/2018   CALCIUM 8.7 (L) 06/04/2018   MG 1.8 06/05/2018   Neuropathy Markers Lab Results  Component Value Date   VITAMINB12 273 04/13/2018   Bone Pathology Markers Lab Results  Component Value Date   ALKPHOS 82 05/06/2018   CALCIUM 8.7 (L) 06/04/2018   Coagulation Parameters Lab Results  Component Value Date   INR 1.10 06/02/2018   LABPROT 14.1 06/02/2018   APTT 74 (H) 05/26/2018   PLT 265 06/13/2018   Cardiovascular Markers Lab Results  Component Value Date   HGB 10.0 (L) 06/13/2018   HCT 32.9 (L) 06/13/2018   Note: Lab results reviewed.  PFSH  Drug:  Mr. Jeffries  reports that he does not use drugs. Alcohol:  reports that he drank about 8.0 standard drinks of alcohol per week. Tobacco:  reports that he quit smoking about 6 months ago. His smoking use included cigarettes. He has a 30.00 pack-year smoking history. He has  never used smokeless tobacco. Medical:  has a past medical history of Anemia, Atrial fibrillation ([place]), Blind, Diabetes mellitus with complication ([place]), Dilated aortic root ([place]), Dysrhythmia, History of echocardiogram, History of hernia repair, History of stress test, Hyperlipidemia, Hypertension, Leg pain, Legally blind, PAD (peripheral artery disease) ([place]), PAF (paroxysmal atrial fibrillation) ([place]), Peripheral vascular disease ([place]), Pulmonary nodules, and Stroke ([place]). Family: family history includes Diabetes in his father; Hyperlipidemia in his father; Hypertension in his father.  Past Surgical History:  Procedure Laterality Date  . AMPUTATION Right 06/02/2018   Procedure: AMPUTATION BELOW KNEE;  Surgeon: [name], MD;  Location: [facility] ORS;  Service: Vascular;  Laterality: Right;  . APPENDECTOMY    . COLONOSCOPY WITH PROPOFOL N/A 05/12/2018   Procedure: COLONOSCOPY WITH PROPOFOL;  Surgeon: [name], MD;  Location: [facility] ENDOSCOPY;  Service: Gastroenterology;  Laterality: N/A;  . ESOPHAGOGASTRODUODENOSCOPY (EGD) WITH PROPOFOL N/A 05/12/2018   Procedure: ESOPHAGOGASTRODUODENOSCOPY (EGD) WITH PROPOFOL;  Surgeon: [name], MD;  Location: [facility] ENDOSCOPY;  Service: Gastroenterology;  Laterality: N/A;  . HERNIA REPAIR     UMBILICAL  . LOWER EXTREMITY ANGIOGRAPHY Right 10/25/2017   Procedure: LOWER EXTREMITY ANGIOGRAPHY;  Surgeon: [name], MD;  Location: [place] CV LAB;  Service: Cardiovascular;  Laterality: Right;  . LOWER EXTREMITY ANGIOGRAPHY Right 01/13/2018   Procedure: LOWER EXTREMITY ANGIOGRAPHY;  Surgeon: [name], MD;  Location: [place] CV LAB;  Service: Cardiovascular;  Laterality: Right;  . LOWER EXTREMITY INTERVENTION  10/25/2017   Procedure: LOWER EXTREMITY INTERVENTION;  Surgeon: [name], MD;  Location: [place] [facility] CV LAB;  Service: Cardiovascular;;  . TONSILLECTOMY     Active Ambulatory Problems    Diagnosis Date Noted  . Hyperlipidemia  01/21/2015  . Benign hypertensive renal disease 01/21/2015  . Tobacco abuse 01/24/2015  . Chronic headaches 03/15/2015  . History of stroke with residual deficit 04/01/2015  . Right homonymous hemianopsia 02/17/2015  . Memory loss 09/27/2015  . Alcoholic peripheral neuropathy ([place]) 05/16/2015  . Diabetes mellitus type 2, uncomplicated ([place]) 78/29/5621  . Diabetic peripheral neuropathy ([place]) 05/16/2015  . Hypertension 01/03/2016  . PVD (peripheral vascular disease) ([place]) 07/15/2017  . A-fib ([place]) 08/20/2017  . Necrotic toes ([place]) 02/21/2018  . Atherosclerosis of native arteries of the extremities with gangrene ([place]) 05/08/2018  . Iron deficiency anemia 05/11/2018  . Gangrene of right foot ([place]) 06/02/2018  . S/P bilateral BKA (below knee amputation) ([place]) 06/11/2018  . Hx of BKA, right ([place]) 06/16/2018  . Acute left PCA stroke ([place]) 02/17/2015  . Stroke ([place]) 07/11/2018  . Chronic pain of right lower extremity 07/11/2018  . Chronic pain syndrome 07/11/2018  . Pharmacologic therapy 07/11/2018  . Disorder of skeletal system 07/11/2018  . Problems influencing health status 07/11/2018   Resolved Ambulatory Problems    Diagnosis Date Noted  . DM (diabetes mellitus), type 2, uncontrolled ([place]) 01/21/2015  . Sudden loss of vision 01/21/2015  . Stroke, acute, embolic ([place]) 30/86/5784  . Ischemic stroke ([place]) 02/17/2015  . HLD (hyperlipidemia) 01/03/2016  . Cerebrovascular accident (CVA) ([place]) 01/03/2016  . Stroke ([place]) 06/21/2017  . Diabetes ([place]) 01/10/2018  . Iron deficiency anemia due to chronic blood loss 05/08/2018   Past Medical  History:  Diagnosis Date  . Anemia   . Atrial fibrillation ([place])   . Blind   . Diabetes mellitus with complication ([place])   . Dilated aortic root ([place])   . Dysrhythmia   . History of echocardiogram   . History of hernia repair   . History of stress test   . Leg pain   . Legally blind   . PAD (peripheral artery disease) ([place])   . PAF  (paroxysmal atrial fibrillation) ([place])   . Peripheral vascular disease ([place])   . Pulmonary nodules    Constitutional Exam  General appearance: Well nourished, well developed, and well hydrated. In no apparent acute distress Vitals:   07/11/18 1322  BP: 124/86  Pulse: 100  Resp: 18  Temp: 97.7 F (36.5 C)  TempSrc: Oral  SpO2: 100%  Weight: 185 lb (83.9 kg)  Height: 5' 9"  (1.753 m)   BMI Assessment: Estimated body mass index is 27.32 kg/m as calculated from the following:   Height as of this encounter: 5' 9"  (1.753 m).   Weight as of this encounter: 185 lb (83.9 kg).  BMI interpretation table: BMI level Category Range association with higher incidence of chronic pain  <18 kg/m2 Underweight   18.5-24.9 kg/m2 Ideal body weight   25-29.9 kg/m2 Overweight Increased incidence by 20%  30-34.9 kg/m2 Obese (Class I) Increased incidence by 68%  35-39.9 kg/m2 Severe obesity (Class II) Increased incidence by 136%  >40 kg/m2 Extreme obesity (Class III) Increased incidence by 254%   BMI Readings from Last 4 Encounters:  07/11/18 27.32 kg/m  07/08/18 27.32 kg/m  06/27/18 26.58 kg/m  06/27/18 25.83 kg/m   Wt Readings from Last 4 Encounters:  07/11/18 185 lb (83.9 kg)  07/08/18 185 lb (83.9 kg)  06/27/18 180 lb (81.6 kg)  06/24/18 180 lb (81.6 kg)  Psych/Mental status: Alert, oriented x 3 (person, place, & time)       Eyes: PERLA Respiratory: No evidence of acute respiratory distress  Cervical Spine Exam  Inspection: No masses, redness, or swelling Alignment: Symmetrical Functional ROM: Unrestricted ROM      Stability: No instability detected Muscle strength & Tone: Functionally intact Sensory: Unimpaired Palpation: No palpable anomalies              Upper Extremity (UE) Exam    Side: Right upper extremity  Side: Left upper extremity  Inspection: No masses, redness, swelling, or asymmetry. No contractures  Inspection: No masses, redness, swelling, or asymmetry. No  contractures  Functional ROM: Unrestricted ROM          Functional ROM: Unrestricted ROM          Muscle strength & Tone: Functionally intact  Muscle strength & Tone: Functionally intact  Sensory: Unimpaired  Sensory: Unimpaired  Palpation: No palpable anomalies              Palpation: No palpable anomalies              Specialized Test(s): Deferred         Specialized Test(s): Deferred          Thoracic Spine Exam  Inspection: No masses, redness, or swelling Alignment: Symmetrical Functional ROM: Unrestricted ROM Stability: No instability detected Sensory: Unimpaired Muscle strength & Tone: No palpable anomalies  Lumbar Spine Exam  Inspection: No masses, redness, or swelling Alignment: Symmetrical Functional ROM: Unrestricted ROM      Stability: No instability detected Muscle strength & Tone: Functionally intact Sensory: Unimpaired Palpation: No palpable anomalies  Provocative Tests: Lumbar Hyperextension and rotation test: evaluation deferred today       Patrick's Maneuver: evaluation deferred today                    Gait & Posture Assessment  Ambulation: Unassisted Gait: Relatively normal for age and body habitus Posture: WNL   Lower Extremity Exam    Side: Right lower extremity  Side: Left lower extremity  Inspection: Below knee amputation (BKA)  Inspection: No masses, redness, swelling, or asymmetry. No contractures    Functional ROM: Unrestricted ROM            Muscle strength & Tone: Functionally intact    Sensory: Unimpaired    Palpation: No palpable anomalies   Assessment  Primary Diagnosis & Pertinent Problem List: The primary encounter diagnosis was Chronic pain of right lower extremity. Diagnoses of Hx of BKA, right ([place]), Chronic pain syndrome, Pharmacologic therapy, Disorder of skeletal system, and Problems influencing health status were also pertinent to this visit.  Visit Diagnosis: 1. Chronic pain of right lower extremity   2. Hx of BKA, right  ([place])   3. Chronic pain syndrome   4. Pharmacologic therapy   5. Disorder of skeletal system   6. Problems influencing health status    Plan of Care  Initial treatment plan:  Please be advised that as per protocol, today's visit has been an evaluation only. We have not taken over the patient's controlled substance management.  Problem-specific plan: No problem-specific Assessment & Plan notes found for this encounter.  Ordered Lab-work, Procedure(s), Referral(s), & Consult(s): Orders Placed This Encounter  Procedures  . Compliance Drug Analysis, Ur  . Magnesium  . Vitamin B12  . Sedimentation rate  . 25-Hydroxyvitamin D Lcms D2+D3  . C-reactive protein   Pharmacotherapy: Medications ordered:  No orders of the defined types were placed in this encounter.  Medications administered during this visit: Dani B. Braman Jr. had no medications administered during this visit.   Pharmacotherapy under consideration:  Opioid Analgesics: The patient was informed that there is no guarantee that he would be a candidate for opioid analgesics. The decision will be made following CDC guidelines. This decision will be based on the results of diagnostic studies, as well as Mr. Tuley's risk profile.  Membrane stabilizer: To be determined at a later time Muscle relaxant: To be determined at a later time NSAID: To be determined at a later time Other analgesic(s): To be determined at a later time   Interventional therapies under consideration: Mr. Fotheringham was informed that there is no guarantee that he would be a candidate for interventional therapies. The decision will be based on the results of diagnostic studies, as well as Mr. Parco's risk profile.  Possible procedure(s): None at this time may consider   Provider-requested follow-up: Return for 2nd Visit, w/ Dr. Dossie Arbour.  Future Appointments  Date Time Provider [place]  07/12/2018 11:15 AM [name], DO [facility]   07/18/2018  1:45 PM [name], MD [facility] None  07/20/2018  2:00 PM Milinda Pointer, MD [facility] None  07/22/2018  1:00 PM Kris Hartmann, NP [facility] None  08/11/2018  3:30 PM [name], DO CFP-CFP [facility]  09/13/2018  3:30 PM [facility]-MO LAB [facility] None  09/14/2018  1:15 PM Earlie Server, MD [facility] None  09/14/2018  1:45 PM CCAR- MO INFUSION CHAIR 3 [facility] None  10/03/2018  2:00 PM Minna Merritts, MD [facility]  03/17/2019  2:00 PM AVVS  VASC 3 AVVS-IMG None  03/17/2019  3:00 PM Dew, Erskine Squibb, MD [facility] None    Primary Care Physician: [name], DO Location: [facility] Outpatient Pain Management Facility Note by:  Date: 07/11/2018; Time: 4:04 PM  Pain Score Disclaimer: We use the NRS-11 scale. This is a self-reported, subjective measurement of pain severity with only modest accuracy. It is used primarily to identify changes within a particular patient. It must be understood that outpatient pain scales are significantly less accurate that those used for research, where they can be applied under ideal controlled circumstances with minimal exposure to variables. In reality, the score is likely to be a combination of pain intensity and pain affect, where pain affect describes the degree of emotional arousal or changes in action readiness caused by the sensory experience of pain. Factors such as social and work situation, setting, emotional state, anxiety levels, expectation, and prior pain experience may influence pain perception and show large inter-individual differences that may also be affected by time variables.  Patient instructions provided during this appointment: Patient Instructions   ____________________________________________________________________________________________  Appointment Policy Summary  It is our goal and responsibility to provide the medical community with assistance in the evaluation and management of patients with chronic pain.  Unfortunately our resources are limited. Because we do not have an unlimited amount of time, or available appointments, we are required to closely monitor and manage their use. The following rules exist to maximize their use:  Patient's responsibilities: 1. Punctuality:  At what time should I arrive? You should be physically present in our office 30 minutes before your scheduled appointment. Your scheduled appointment is with your assigned healthcare provider. However, it takes 5-10 minutes to be "checked-in", and another 15 minutes for the nurses to do the admission. If you arrive to our office at the time you were given for your appointment, you will end up being at least 20-25 minutes late to your appointment with the provider. 2. Tardiness:  What happens if I arrive only a few minutes after my scheduled appointment time? You will need to reschedule your appointment. The cutoff is your appointment time. This is why it is so important that you arrive at least 30 minutes before that appointment. If you have an appointment scheduled for 10:00 AM and you arrive at 10:01, you will be required to reschedule your appointment.  3. Plan ahead:  Always assume that you will encounter traffic on your way in. Plan for it. If you are dependent on a driver, make sure they understand these rules and the need to arrive early. 4. Other appointments and responsibilities:  Avoid scheduling any other appointments before or after your pain clinic appointments.  5. Be prepared:  Write down everything that you need to discuss with your healthcare provider and give this information to the admitting nurse. Write down the medications that you will need refilled. Bring your pills and bottles (even the empty ones), to all of your appointments, except for those where a procedure is scheduled. 6. No children or pets:  Find someone to take care of them. It is not appropriate to bring them in. 7. Scheduling changes:  We request  "advanced notification" of any changes or cancellations. 8. Advanced notification:  Defined as a time period of more than 24 hours prior to the originally scheduled appointment. This allows for the appointment to be offered to other patients. 9. Rescheduling:  When a visit is rescheduled, it will require the cancellation of the original appointment.  For this reason they both fall within the category of "Cancellations".  10. Cancellations:  They require advanced notification. Any cancellation less than 24 hours before the  appointment will be recorded as a "No Show". 11. No Show:  Defined as an unkept appointment where the patient failed to notify or declare to the practice their intention or inability to keep the appointment.  Corrective process for repeat offenders:  1. Tardiness: Three (3) episodes of rescheduling due to late arrivals will be recorded as one (1) "No Show". 2. Cancellation or reschedule: Three (3) cancellations or rescheduling will be recorded as one (1) "No Show". 3. "No Shows": Three (3) "No Shows" within a 12 month period will result in discharge from the practice. ____________________________________________________________________________________________   ______________________________________________________________________________________________  Specialty Pain Scale  Introduction:  There are significant differences in how pain is reported. The word pain usually refers to physical pain, but it is also a common synonym of suffering. The medical community uses a scale from 0 (zero) to 10 (ten) to report pain level. Zero (0) is described as "no pain", while ten (10) is described as "the worse pain you can imagine". The problem with this scale is that physical pain is reported along with suffering. Suffering refers to mental pain, or more often yet it refers to any unpleasant feeling, emotion or aversion associated with the perception of harm or threat of harm. It is the  psychological component of pain.  Pain Specialists prefer to separate the two components. The pain scale used by this practice is the Verbal Numerical Rating Scale (VNRS-11). This scale is for the physical pain only. DO NOT INCLUDE how your pain psychologically affects you. This scale is for adults 67 years of age and older. It has 11 (eleven) levels. The 1st level is 0/10. This means: "right now, I have no pain". In the context of pain management, it also means: "right now, my physical pain is under control with the current therapy".  General Information:  The scale should reflect your current level of pain. Unless you are specifically asked for the level of your worst pain, or your average pain. If you are asked for one of these two, then it should be understood that it is over the past 24 hours.  Levels 1 (one) through 5 (five) are described below, and can be treated as an outpatient. Ambulatory pain management facilities such as ours are more than adequate to treat these levels. Levels 6 (six) through 10 (ten) are also described below, however, these must be treated as a hospitalized patient. While levels 6 (six) and 7 (seven) may be evaluated at an urgent care facility, levels 8 (eight) through 10 (ten) constitute medical emergencies and as such, they belong in a hospital's emergency department. When having these levels (as described below), do not come to our office. Our facility is not equipped to manage these levels. Go directly to an urgent care facility or an emergency department to be evaluated.  Definitions:  Activities of Daily Living (ADL): Activities of daily living (ADL or ADLs) is a term used in healthcare to refer to people's daily self-care activities. Health professionals often use a person's ability or inability to perform ADLs as a measurement of their functional status, particularly in regard to people post injury, with disabilities and the elderly. There are two ADL levels: Basic and  Instrumental. Basic Activities of Daily Living (BADL  or BADLs) consist of self-care tasks that include: Bathing and showering; personal hygiene and grooming (  including brushing/combing/styling hair); dressing; Toilet hygiene (getting to the toilet, cleaning oneself, and getting back up); eating and self-feeding (not including cooking or chewing and swallowing); functional mobility, often referred to as "transferring", as measured by the ability to walk, get in and out of bed, and get into and out of a chair; the broader definition (moving from one place to another while performing activities) is useful for people with different physical abilities who are still able to get around independently. Basic ADLs include the things many people do when they get up in the morning and get ready to go out of the house: get out of bed, go to the toilet, bathe, dress, groom, and eat. On the average, loss of function typically follows a particular order. Hygiene is the first to go, followed by loss of toilet use and locomotion. The last to go is the ability to eat. When there is only one remaining area in which the person is independent, there is a 62.9% chance that it is eating and only a 3.5% chance that it is hygiene. Instrumental Activities of Daily Living (IADL or IADLs) are not necessary for fundamental functioning, but they let an individual live independently in a community. IADL consist of tasks that include: cleaning and maintaining the house; home establishment and maintenance; care of others (including selecting and supervising caregivers); care of pets; child rearing; managing money; managing financials (investments, etc.); meal preparation and cleanup; shopping for groceries and necessities; moving within the community; safety procedures and emergency responses; health management and maintenance (taking prescribed medications); and using the telephone or other form of communication.  Instructions:  Most  patients tend to report their pain as a combination of two factors, their physical pain and their psychosocial pain. This last one is also known as "suffering" and it is reflection of how physical pain affects you socially and psychologically. From now on, report them separately.  From this point on, when asked to report your pain level, report only your physical pain. Use the following table for reference.  Pain Clinic Pain Levels (0-5/10)  Pain Level Score  Description  No Pain 0   Mild pain 1 Nagging, annoying, but does not interfere with basic activities of daily living (ADL). Patients are able to eat, bathe, get dressed, toileting (being able to get on and off the toilet and perform personal hygiene functions), transfer (move in and out of bed or a chair without assistance), and maintain continence (able to control bladder and bowel functions). Blood pressure and heart rate are unaffected. A normal heart rate for a healthy adult ranges from 60 to 100 bpm (beats per minute).   Mild to moderate pain 2 Noticeable and distracting. Impossible to hide from other people. More frequent flare-ups. Still possible to adapt and function close to normal. It can be very annoying and may have occasional stronger flare-ups. With discipline, patients may get used to it and adapt.   Moderate pain 3 Interferes significantly with activities of daily living (ADL). It becomes difficult to feed, bathe, get dressed, get on and off the toilet or to perform personal hygiene functions. Difficult to get in and out of bed or a chair without assistance. Very distracting. With effort, it can be ignored when deeply involved in activities.   Moderately severe pain 4 Impossible to ignore for more than a few minutes. With effort, patients may still be able to manage work or participate in some social activities. Very difficult to concentrate. Signs of autonomic nervous  system discharge are evident: dilated pupils (mydriasis); mild  sweating (diaphoresis); sleep interference. Heart rate becomes elevated (>115 bpm). Diastolic blood pressure (lower number) rises above 100 mmHg. Patients find relief in laying down and not moving.   Severe pain 5 Intense and extremely unpleasant. Associated with frowning face and frequent crying. Pain overwhelms the senses.  Ability to do any activity or maintain social relationships becomes significantly limited. Conversation becomes difficult. Pacing back and forth is common, as getting into a comfortable position is nearly impossible. Pain wakes you up from deep sleep. Physical signs will be obvious: pupillary dilation; increased sweating; goosebumps; brisk reflexes; cold, clammy hands and feet; nausea, vomiting or dry heaves; loss of appetite; significant sleep disturbance with inability to fall asleep or to remain asleep. When persistent, significant weight loss is observed due to the complete loss of appetite and sleep deprivation.  Blood pressure and heart rate becomes significantly elevated. Caution: If elevated blood pressure triggers a pounding headache associated with blurred vision, then the patient should immediately seek attention at an urgent or emergency care unit, as these may be signs of an impending stroke.    Emergency Department Pain Levels (6-10/10)  Emergency Room Pain 6 Severely limiting. Requires emergency care and should not be seen or managed at an outpatient pain management facility. Communication becomes difficult and requires great effort. Assistance to reach the emergency department may be required. Facial flushing and profuse sweating along with potentially dangerous increases in heart rate and blood pressure will be evident.   Distressing pain 7 Self-care is very difficult. Assistance is required to transport, or use restroom. Assistance to reach the emergency department will be required. Tasks requiring coordination, such as bathing and getting dressed become very  difficult.   Disabling pain 8 Self-care is no longer possible. At this level, pain is disabling. The individual is unable to do even the most "basic" activities such as walking, eating, bathing, dressing, transferring to a bed, or toileting. Fine motor skills are lost. It is difficult to think clearly.   Incapacitating pain 9 Pain becomes incapacitating. Thought processing is no longer possible. Difficult to remember your own name. Control of movement and coordination are lost.   The worst pain imaginable 10 At this level, most patients pass out from pain. When this level is reached, collapse of the autonomic nervous system occurs, leading to a sudden drop in blood pressure and heart rate. This in turn results in a temporary and dramatic drop in blood flow to the brain, leading to a loss of consciousness. Fainting is one of the body's self defense mechanisms. Passing out puts the brain in a calmed state and causes it to shut down for a while, in order to begin the healing process.    Summary: 1. Refer to this scale when providing [place] with your pain level. 2. Be accurate and careful when reporting your pain level. This will help with your care. 3. Over-reporting your pain level will lead to loss of credibility. 4. Even a level of 1/10 means that there is pain and will be treated at our facility. 5. High, inaccurate reporting will be documented as "Symptom Exaggeration", leading to loss of credibility and suspicions of possible secondary gains such as obtaining more narcotics, or wanting to appear disabled, for fraudulent reasons. 6. Only pain levels of 5 or below will be seen at our facility. 7. Pain levels of 6 and above will be sent to the Emergency Department and the appointment cancelled. ______________________________________________________________________________________________

## 2018-07-11 NOTE — Progress Notes (Signed)
Safety precautions to be maintained throughout the outpatient stay will include: orient to surroundings, keep bed in low position, maintain call bell within reach at all times, provide assistance with transfer out of bed and ambulation.  

## 2018-07-12 ENCOUNTER — Encounter: Payer: Self-pay | Admitting: Family Medicine

## 2018-07-12 ENCOUNTER — Ambulatory Visit (INDEPENDENT_AMBULATORY_CARE_PROVIDER_SITE_OTHER): Payer: Medicare HMO | Admitting: Family Medicine

## 2018-07-12 VITALS — BP 108/74 | HR 96 | Temp 97.8°F

## 2018-07-12 DIAGNOSIS — G546 Phantom limb syndrome with pain: Secondary | ICD-10-CM

## 2018-07-12 DIAGNOSIS — Z89512 Acquired absence of left leg below knee: Secondary | ICD-10-CM | POA: Diagnosis not present

## 2018-07-12 DIAGNOSIS — Z89511 Acquired absence of right leg below knee: Secondary | ICD-10-CM | POA: Diagnosis not present

## 2018-07-12 MED ORDER — OXYCODONE HCL ER 10 MG PO T12A: 10.0000 mg | EXTENDED_RELEASE_TABLET | Freq: Two times a day (BID) | ORAL | 0 refills | Status: DC

## 2018-07-12 MED ORDER — GABAPENTIN 400 MG PO CAPS: 800.0000 mg | ORAL_CAPSULE | Freq: Three times a day (TID) | ORAL | 1 refills | Status: DC

## 2018-07-12 NOTE — Assessment & Plan Note (Signed)
Not doing well, now with phantom pain in the limb. Has an appointment pending with pain management. No longer getting medication from his surgeon. We will increase his gabapentin to 800mg  BID and restart his oxycontin BID- he is aware that we do not do long term pain management in this office and he will need to keep his appointment with pain management.

## 2018-07-12 NOTE — Progress Notes (Signed)
BP 108/74   Pulse 96   Temp 97.8 F (36.6 C) (Oral)   SpO2 98%    Subjective:    Patient ID: [name]., male    DOB: [birth year], 62 y.o.   MRN: [id number]  HPI: [name]. is a 62 y.o. male  Chief Complaint  Patient presents with  . Medication Management    Gabapentin - Pt does not think this works Dr. Lucky Cowboy told them they could double the dose, which they have, Perocet - doubled. Dr. Lucky Cowboy took him. Sees pain managment Dr. on the 18th.    CHRONIC PAIN- having a lot of pain since his surgery. Stump is hurting a lot. He just established with pain management yesterday- to see them again in 8 days  Present dose:  Morphine equivalents Pain control status: uncontrolled Duration: months Location: R stump Quality: sharp Current Pain Level: 8/10 Previous Pain Level: 5/10 Breakthrough pain: yes Benefit from narcotic medications: yes- for about an hour to hour and a half What Activities task can be accomplished with current medication?  Interested in weaning off narcotics:no   Stool softners/OTC fiber: no  Previous pain specialty evaluation: yes Non-narcotic analgesic meds: yes Narcotic contract: no  Relevant past medical, surgical, family and social history reviewed and updated as indicated. Interim medical history since our last visit reviewed. Allergies and medications reviewed and updated.  Review of Systems  Per HPI unless specifically indicated above     Objective:    BP 108/74   Pulse 96   Temp 97.8 F (36.6 C) (Oral)   SpO2 98%   Wt Readings from Last 3 Encounters:  07/11/18 185 lb (83.9 kg)  07/08/18 185 lb (83.9 kg)  06/27/18 180 lb (81.6 kg)    Physical Exam  Results for orders placed or performed in visit on 07/11/18  Magnesium  Result Value Ref Range   Magnesium 1.9 1.6 - 2.3 mg/dL  Vitamin B12  Result Value Ref Range   Vitamin B-12 529 232 - 1,245 pg/mL  Sedimentation rate  Result Value Ref Range   Sed Rate 81 (H) 0 - 30 mm/hr    25-Hydroxyvitamin D Lcms D2+D3  Result Value Ref Range   25-Hydroxy, Vitamin D WILL FOLLOW    25-Hydroxy, Vitamin D-2 WILL FOLLOW    25-Hydroxy, Vitamin D-3 WILL FOLLOW   C-reactive protein  Result Value Ref Range   CRP 8 0 - 10 mg/L      Assessment & Plan:   Problem List Items Addressed This Visit      Other   S/P bilateral BKA (below knee amputation) ([place]) - Primary    Not doing well, now with phantom pain in the limb. Has an appointment pending with pain management. No longer getting medication from his surgeon. We will increase his gabapentin to 800mg  BID and restart his oxycontin BID- he is aware that we do not do long term pain management in this office and he will need to keep his appointment with pain management.       Phantom pain ([place])    Not doing well, now with phantom pain in the limb. Has an appointment pending with pain management. No longer getting medication from his surgeon. We will increase his gabapentin to 800mg  BID and restart his oxycontin BID- he is aware that we do not do long term pain management in this office and he will need to keep his appointment with pain management.  Follow up plan: Return in about 4 weeks (around 08/09/2018).

## 2018-07-13 ENCOUNTER — Telehealth: Payer: Self-pay | Admitting: Family Medicine

## 2018-07-13 ENCOUNTER — Telehealth: Payer: Self-pay

## 2018-07-13 ENCOUNTER — Ambulatory Visit
Admission: RE | Admit: 2018-07-13 | Discharge: 2018-07-13 | Disposition: A | Discharge status: Home / Self Care | Payer: Medicare HMO | Attending: Gastroenterology | Admitting: Gastroenterology

## 2018-07-13 ENCOUNTER — Encounter
Admission: RE | Disposition: A | Discharge status: Home / Self Care | Payer: Self-pay | Source: Home / Self Care | Attending: Gastroenterology

## 2018-07-13 DIAGNOSIS — K31819 Angiodysplasia of stomach and duodenum without bleeding: Secondary | ICD-10-CM | POA: Insufficient documentation

## 2018-07-13 DIAGNOSIS — D509 Iron deficiency anemia, unspecified: Secondary | ICD-10-CM | POA: Insufficient documentation

## 2018-07-13 HISTORY — PX: GIVENS CAPSULE STUDY: SHX5432

## 2018-07-13 SURGERY — IMAGING PROCEDURE, GI TRACT, INTRALUMINAL, VIA CAPSULE

## 2018-07-13 MED ORDER — OXYCODONE ER 9 MG PO C12A: 9.0000 mg | EXTENDED_RELEASE_CAPSULE | Freq: Two times a day (BID) | ORAL | 0 refills | Status: DC

## 2018-07-13 NOTE — Telephone Encounter (Signed)
Called for per certification of patient's gabapentin. Medication approved for 540 capsules per 90 days. Approval good through 08/03/19. Will receive fax with this information as well and the patient will be mailed a letter also.

## 2018-07-13 NOTE — Addendum Note (Signed)
Addended by: [name] on: 07/13/2018 12:22 PM   Modules accepted: Orders

## 2018-07-13 NOTE — Telephone Encounter (Signed)
Message relayed to patient. Verbalized understanding and denied questions.   

## 2018-07-13 NOTE — Telephone Encounter (Signed)
Copied from [place] [id number]. Topic: General - Other >> Jul 12, 2018 12:52 PM Mcneil, Ja-Kwan wrote: Reason for CRM: Pt wife Neoma Laming stated that the pharmacy informed them that patient insurance will not cover  gabapentin (NEURONTIN) 400 MG capsule it needs to changed to  gabapentin (NEURONTIN) 800 MG capsule. Pt wife also stated that they were advised that the oxyCODONE (OXYCONTIN) 10 mg 12 hr tablet is non formulary and insurance would need some type of approval from the prescribing doctor. Cb# [phone] >> Jul 13, 2018 10:50 AM [name] L wrote: Patient's wife is calling to check the status. Please advise.

## 2018-07-13 NOTE — Telephone Encounter (Signed)
[place] started PAs today- please let wife know

## 2018-07-13 NOTE — Telephone Encounter (Signed)
Dr. Wynetta Emery, do you want to change the RX for complete PA for oxycodone?

## 2018-07-13 NOTE — Telephone Encounter (Signed)
Copied from [place] [id number]. Topic: Quick Communication - See Telephone Encounter >> Jul 13, 2018  9:18 AM Conception Chancy, NT wrote: CRM for notification. See Telephone encounter for: 07/13/18.  Juliann Pulse is calling from [place] in regards to oxyCODONE (OXYCONTIN) 10 mg 12 hr tablet. She states it is a non formulary medication and it will need a prior authorization or it can be switched to Morphine ER or Hysingla ER  Cb# [phone]

## 2018-07-13 NOTE — Telephone Encounter (Signed)
i've sent through the [place]. We'll see if that gets covered.

## 2018-07-13 NOTE — Telephone Encounter (Signed)
Copied from [place] [id number]. Topic: Quick Communication - Rx Refill/Question >> Jul 13, 2018 12:54 PM Alanda Slim E wrote: Medication: Hysingla ER   Has the patient contacted their pharmacy? Yes.   Pharmacy advised that Pts insurance will not cover oxyCODONE ER 9 MG C12A that was sent over/ insurance is requesting a prescription for Hysingla ER instead/please advise    Preferred Pharmacy (with phone number or street name): [id number] - [place], [place]. [address] [phone] (Phone) [phone] (Fax)    Agent: Please be advised that RX refills may take up to 3 business days. We ask that you follow-up with your pharmacy.

## 2018-07-14 ENCOUNTER — Encounter: Payer: Self-pay | Admitting: Gastroenterology

## 2018-07-14 LAB — 25-HYDROXY VITAMIN D LCMS D2+D3
25-Hydroxy, Vitamin D-2: <1 ng/mL
25-Hydroxy, Vitamin D-3: 26 ng/mL
25-Hydroxy, Vitamin D: 26 ng/mL — ABNORMAL LOW

## 2018-07-14 LAB — COMPLIANCE DRUG ANALYSIS, UR

## 2018-07-14 LAB — MAGNESIUM: Magnesium: 1.9 mg/dL (ref 1.6–2.3)

## 2018-07-14 LAB — C-REACTIVE PROTEIN: CRP: 8 mg/L (ref 0–10)

## 2018-07-14 LAB — SEDIMENTATION RATE: Sed Rate: 81 mm/hr — ABNORMAL HIGH (ref 0–30)

## 2018-07-14 LAB — VITAMIN B12: Vitamin B-12: 529 pg/mL (ref 232–1245)

## 2018-07-15 ENCOUNTER — Encounter (INDEPENDENT_AMBULATORY_CARE_PROVIDER_SITE_OTHER): Payer: Self-pay | Admitting: Nurse Practitioner

## 2018-07-15 NOTE — Progress Notes (Signed)
Subjective:    Patient ID: Glenn Gerold., male    DOB: [birth year], 62 y.o.   MRN: [id number] Chief Complaint  Patient presents with  . Follow-up    wound check    HPI  Glenn Grim. is a 62 y.o. male that presents today for removal of staples from his right below the knee amputation.  He underwent amputation on 06/02/2018 following gangrene of his right foot.  Sometime after his amputation the patient suffered a fall and fell on his knee amputation.  However, today the wound is well approximated however there are several areas of large scabs along the amputation site.  The patient has also been referred to pain management for continued pain control.  Patient denies any fever, chills, nausea, vomiting or diarrhea.  The patient is a somewhat poor historian due to prior stroke.  His wife is present with him at this visit.  Past Medical History:  Diagnosis Date  . Acute left PCA stroke ([place]) 02/17/2015  . Anemia   . Atherosclerosis of native arteries of the extremities with gangrene ([place]) 05/08/2018  . Atrial fibrillation ([place])   . Blind    right eye  . Diabetes mellitus with complication ([place])   . Dilated aortic root ([place])    a. 04/2018 Echo: 4.1cm. Asc Ao 3.5cm.  Marland Kitchen Dysrhythmia   . Gangrene of right foot ([place]) 06/02/2018  . History of echocardiogram    a. 04/2018 Echo: Ef 60-65%, no rwma, midly to mod dil Ao root - 4.1cm. Asc Ao 3.5cm. Mild MR. Nl RV fxn. Nl PASP.  Marland Kitchen History of hernia repair   . History of stress test    a. 2016 MV ([name]): EF 58%, no ischemia.  . Hyperlipidemia   . Hypertension   . Leg pain   . Legally blind   . Necrotic toes ([place]) 02/21/2018  . PAD (peripheral artery disease) ([place])   . PAF (paroxysmal atrial fibrillation) ([name])    a. on Eliquis as of 2018; b. CHADS2VASc => 5 (HTN, DM, stroke x 2, vascular disease)  . Peripheral vascular disease ([place])    a. followed by Dr. Lucky Cowboy; b. s/p kissing balloon stents and right external iliac stent in 10/2017;  c. LE angiogrpahy 01/2018: No significant arterial occlusive disease in the lower extremities.  . Pulmonary nodules   . Stroke [facility])    a. 2016 & 2018  . Stroke [facility]) 07/11/2018    Past Surgical History:  Procedure Laterality Date  . AMPUTATION Right 06/02/2018   Procedure: AMPUTATION BELOW KNEE;  Surgeon: [name], MD;  Location: [facility] ORS;  Service: Vascular;  Laterality: Right;  . APPENDECTOMY    . COLONOSCOPY WITH PROPOFOL N/A 05/12/2018   Procedure: COLONOSCOPY WITH PROPOFOL;  Surgeon: [name], MD;  Location: [facility] ENDOSCOPY;  Service: Gastroenterology;  Laterality: N/A;  . ESOPHAGOGASTRODUODENOSCOPY (EGD) WITH PROPOFOL N/A 05/12/2018   Procedure: ESOPHAGOGASTRODUODENOSCOPY (EGD) WITH PROPOFOL;  Surgeon: [name], MD;  Location[facility] ENDOSCOPY;  Service: Gastroenterology;  Laterality: N/A;  . GIVENS CAPSULE STUDY N/A 07/13/2018   Procedure: GIVENS CAPSULE STUDY;  Surgeon: [name], MD;  Location: [facility] ENDOSCOPY;  Service: Gastroenterology;  Laterality: N/A;  . HERNIA REPAIR     UMBILICAL  . LOWER EXTREMITY ANGIOGRAPHY Right 10/25/2017   Procedure: LOWER EXTREMITY ANGIOGRAPHY;  Surgeon: [name], MD;  Location: [place] CV LAB;  Service: Cardiovascular;  Laterality: Right;  . LOWER EXTREMITY ANGIOGRAPHY Right 01/13/2018   Procedure: LOWER EXTREMITY ANGIOGRAPHY;  Surgeon: [name], MD;  Location: [place] CV LAB;  Service: Cardiovascular;  Laterality: Right;  . LOWER EXTREMITY INTERVENTION  10/25/2017   Procedure: LOWER EXTREMITY INTERVENTION;  Surgeon: [name], MD;  Location: [place] CV LAB;  Service: Cardiovascular;;  . TONSILLECTOMY      Social History   Socioeconomic History  . Marital status: Married    Spouse name: Not on file  . Number of children: 0  . Years of education: Not on file  . Highest education level: Not on file  Occupational History  . Not on file  Social Needs  . Financial resource strain: Not on file  . Food  insecurity:    Worry: Not on file    Inability: Not on file  . Transportation needs:    Medical: Not on file    Non-medical: Not on file  Tobacco Use  . Smoking status: Former Smoker    Packs/day: 1.00    Years: 30.00    Pack years: 30.00    Types: Cigarettes    Last attempt to quit: 01/09/2018    Years since quitting: 0.5  . Smokeless tobacco: Never Used  Substance and Sexual Activity  . Alcohol use: Not Currently    Alcohol/week: 8.0 standard drinks    Types: 8 Cans of beer per week    Comment: occasionally beer   . Drug use: No  . Sexual activity: Yes  Lifestyle  . Physical activity:    Days per week: Not on file    Minutes per session: Not on file  . Stress: Not on file  Relationships  . Social connections:    Talks on phone: Not on file    Gets together: Not on file    Attends religious service: Not on file    Active member of club or organization: Not on file    Attends meetings of clubs or organizations: Not on file    Relationship status: Not on file  . Intimate partner violence:    Fear of current or ex partner: Not on file    Emotionally abused: Not on file    Physically abused: Not on file    Forced sexual activity: Not on file  Other Topics Concern  . Not on file  Social History Narrative  . Not on file    Family History  Problem Relation Age of Onset  . Diabetes Father   . Hypertension Father   . Hyperlipidemia Father     Allergies  Allergen Reactions  . Sodium Pentobarbital [Pentobarbital] Shortness Of Breath  . Lipitor [Atorvastatin] Rash     Review of Systems   Review of Systems: Negative Unless Checked Constitutional: [] Weight loss  [] Fever  [] Chills Cardiac: [] Chest pain   []  Atrial Fibrillation  [] Palpitations   [] Shortness of breath when laying flat   [] Shortness of breath with exertion. Vascular:  [] Pain in legs with walking   [] Pain in legs with standing  [] History of DVT   [] Phlebitis   [] Swelling in legs   [] Varicose veins    [] Non-healing ulcers Pulmonary:   [] Uses home oxygen   [] Productive cough   [] Hemoptysis   [] Wheeze  [] COPD   [] Asthma Neurologic:  [] Dizziness   [] Seizures   [] History of stroke   [] History of TIA  [] Aphasia   [] Vissual changes   [] Weakness or numbness in arm   [x] Weakness or numbness in leg Musculoskeletal:   [] Joint swelling   [] Joint pain   [] Low back pain  []   History of Knee Replacement Hematologic:  [] Easy bruising  [] Easy bleeding   [] Hypercoagulable state   [] Anemic Gastrointestinal:  [] Diarrhea   [] Vomiting  [] Gastroesophageal reflux/heartburn   [] Difficulty swallowing. Genitourinary:  [] Chronic kidney disease   [] Difficult urination  [] Anuric   [] Blood in urine Skin:  [] Rashes   [] Ulcers  Psychological:  [x] History of anxiety   [x]  History of major depression  [x]  Memory Difficulties     Objective:   Physical Exam  BP 113/69 (BP Location: Right Arm)   Pulse 87   Ht 5\' 9"  (1.753 m)   Wt 185 lb (83.9 kg)   BMI 27.32 kg/m   Gen: WD/WN, NAD, forgetful Head: [place]/AT, No temporalis wasting.  Ear/Nose/Throat: Hearing grossly intact, nares w/o erythema or drainage Eyes: PER, EOMI, sclera nonicteric.  Neck: Supple, no masses.  No JVD.  Pulmonary:  Good air movement, no use of accessory muscles.  Cardiac: RRR Vascular:  Well approximated wound, however heavily scabbed over.  Areas of bruising under the stump.  Some foul-smelling drainage present with staple removal Vessel Right Left  Radial Palpable Palpable   Gastrointestinal: soft, non-distended. No guarding/no peritoneal signs.  Musculoskeletal: Right BKA Neurologic: Pain and light touch intact in extremities.  Symmetrical.  Speech is fluent. Motor exam as listed above. Psychiatric: Judgment intact, Mood & affect appropriate for pt's clinical situation. Dermatologic: No Venous rashes. No Ulcers Noted.  No changes consistent with cellulitis. Lymph : No Cervical lymphadenopathy, no lichenification or skin changes of chronic  lymphedema.      Assessment & Plan:   1. PVD (peripheral vascular disease) ([place]) We will have the patient return in approximately 2 weeks for removal of more staples.  Due to heavy bruising and scabbing did not want to remove all staples.  Removed approximately 1/3 at this visit.  We will also see if there is a decrease in the foul-smelling drainage due to antibiotic therapy.  2. Mixed hyperlipidemia Continue statin as ordered and reviewed, no changes at this time   3. Memory loss Previous history of stroke believes patient a little forgetful at times.  Patient has wife present with him at this visit.  Current Outpatient Medications on File Prior to Visit  Medication Sig Dispense Refill  . acetaminophen (TYLENOL) 500 MG tablet Take 1,000 mg by mouth every 6 (six) hours as needed (for pain.).    Marland Kitchen apixaban (ELIQUIS) 5 MG TABS tablet Take 1 tablet (5 mg total) by mouth 2 (two) times daily. 180 tablet 1  . aspirin 81 MG EC tablet Take 1 tablet (81 mg total) by mouth daily. 90 tablet 4  . Biotin (BIOTIN 5000) 5 MG CAPS Take 1 capsule by mouth daily.    . empagliflozin (JARDIANCE) 10 MG TABS tablet Take 5 mg by mouth daily. 90 tablet 1  . ezetimibe-simvastatin (VYTORIN) 10-40 MG tablet Take 1 tablet by mouth daily at 6 PM. 90 tablet 3  . ferrous sulfate (FERROUSUL) 325 (65 FE) MG tablet Take 1 tablet (325 mg total) by mouth daily with breakfast. 90 tablet 3  . metFORMIN (GLUCOPHAGE) 1000 MG tablet Take 1 tablet (1,000 mg total) by mouth 2 (two) times daily with a meal. 180 tablet 3  . metoprolol tartrate (LOPRESSOR) 25 MG tablet Take 1 tablet (25 mg total) by mouth 2 (two) times daily. 180 tablet 3  . Multiple Vitamin (MULTIVITAMIN WITH MINERALS) TABS tablet Take 1 tablet by mouth daily. 30 tablet 0  . silver sulfADIAZINE (SILVADENE) 1 % cream Apply 1 application  topically daily. Apply to stump daily. 50 g 5  . vitamin C (ASCORBIC ACID) 500 MG tablet Take 500 mg by mouth daily.    .  hydrochlorothiazide (MICROZIDE) 12.5 MG capsule Take 1 capsule (12.5 mg total) by mouth daily. 90 capsule 3   No current facility-administered medications on file prior to visit.     There are no Patient Instructions on file for this visit. No follow-ups on file.   Kris Hartmann, NP  This note was completed with Sales executive.  Any errors are purely unintentional.

## 2018-07-18 ENCOUNTER — Encounter: Payer: Self-pay | Admitting: Nurse Practitioner

## 2018-07-18 ENCOUNTER — Encounter: Payer: Self-pay | Admitting: Gastroenterology

## 2018-07-18 ENCOUNTER — Ambulatory Visit: Payer: Medicare HMO | Admitting: Gastroenterology

## 2018-07-18 VITALS — BP 105/64 | HR 112 | Ht 69.0 in

## 2018-07-18 DIAGNOSIS — K5520 Angiodysplasia of colon without hemorrhage: Secondary | ICD-10-CM | POA: Diagnosis not present

## 2018-07-18 DIAGNOSIS — E559 Vitamin D deficiency, unspecified: Secondary | ICD-10-CM | POA: Insufficient documentation

## 2018-07-18 DIAGNOSIS — D509 Iron deficiency anemia, unspecified: Secondary | ICD-10-CM | POA: Diagnosis not present

## 2018-07-18 DIAGNOSIS — R700 Elevated erythrocyte sedimentation rate: Secondary | ICD-10-CM | POA: Insufficient documentation

## 2018-07-18 NOTE — Progress Notes (Signed)
[name] MD, MRCP(U.K) [address]  [place]  [place], [place] [address]  Main: [phone]  Fax: [phone]   Primary Care Physician: [name], DO  Primary Gastroenterologist:  Dr. [name]   No chief complaint on file.   HPI: Marchello Rothgeb. is a 62 y.o. male    Summary of history :  Initially referred an seen in 04/2010 for iron deficiency anemia. Noted to have microcytic anemia. Normal 7 months earlier. Iron studies performed on 04/07/2018 demonstrates a ferritin of 13. Fecal occult blood testing is positive. He is on Eliquis. Denied any overt blood loss.   04/13/18: no blood in urine,noirmal b12 and folate. Celiac serology negative.   05/12/18: EGD: gastritis on biopsy, colonoscopy 5 small polyps resected-tubular adenomas.     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  Receiving IV iron by Dr Tasia Catchings    No complaints today ,denies any black stool.    Current Outpatient Medications  Medication Sig Dispense Refill  . acetaminophen (TYLENOL) 500 MG tablet Take 1,000 mg by mouth every 6 (six) hours as needed (for pain.).    Marland Kitchen apixaban (ELIQUIS) 5 MG TABS tablet Take 1 tablet (5 mg total) by mouth 2 (two) times daily. 180 tablet 1  . aspirin 81 MG EC tablet Take 1 tablet (81 mg total) by mouth daily. 90 tablet 4  . Biotin (BIOTIN 5000) 5 MG CAPS Take 1 capsule by mouth daily.    Marland Kitchen donepezil (ARICEPT) 5 MG tablet Take 5 mg by mouth at bedtime.    Marland Kitchen doxycycline (VIBRAMYCIN) 100 MG capsule Take 1 capsule (100 mg total) by mouth 2 (two) times daily. 28 capsule 0  . empagliflozin (JARDIANCE) 10 MG TABS tablet Take 5 mg by mouth daily. 90 tablet 1  . ezetimibe-simvastatin (VYTORIN) 10-40 MG tablet Take 1 tablet by mouth daily at 6 PM. 90 tablet 3  . ferrous sulfate (FERROUSUL) 325 (65 FE) MG tablet Take 1 tablet (325  mg total) by mouth daily with breakfast. 90 tablet 3  . gabapentin (NEURONTIN) 400 MG capsule Take 2 capsules (800 mg total) by mouth 3 (three) times daily. 540 capsule 1  . hydrochlorothiazide (MICROZIDE) 12.5 MG capsule Take 1 capsule (12.5 mg total) by mouth daily. 90 capsule 3  . metFORMIN (GLUCOPHAGE) 1000 MG tablet Take 1 tablet (1,000 mg total) by mouth 2 (two) times daily with a meal. 180 tablet 3  . metoprolol tartrate (LOPRESSOR) 25 MG tablet Take 1 tablet (25 mg total) by mouth 2 (two) times daily. 180 tablet 3  . Multiple Vitamin (MULTIVITAMIN WITH MINERALS) TABS tablet Take 1 tablet by mouth daily. 30 tablet 0  . oxyCODONE (OXYCONTIN) 10 mg 12 hr tablet Take 1 tablet (10 mg total) by mouth every 12 (twelve) hours. 60 tablet 0  . oxyCODONE ER 9 MG C12A Take 9 mg by mouth 2 (two) times daily. 60 each 0  . silver sulfADIAZINE (SILVADENE) 1 % cream Apply 1 application topically daily. Apply to stump daily. 50 g 5  . vitamin C (ASCORBIC ACID) 500 MG tablet Take 500 mg by mouth daily.     No current facility-administered medications for this visit.     Allergies as of 07/18/2018 - Review Complete 07/15/2018  Allergen Reaction Noted  . Sodium pentobarbital [pentobarbital] Shortness Of Breath 01/13/2018  . Lipitor [  atorvastatin] Rash 03/15/2015    ROS:  General: Negative for anorexia, weight loss, fever, chills, fatigue, weakness. ENT: Negative for hoarseness, difficulty swallowing , nasal congestion. CV: Negative for chest pain, angina, palpitations, dyspnea on exertion, peripheral edema.  Respiratory: Negative for dyspnea at rest, dyspnea on exertion, cough, sputum, wheezing.  GI: See history of present illness. GU:  Negative for dysuria, hematuria, urinary incontinence, urinary frequency, nocturnal urination.  Endo: Negative for unusual weight change.    Physical Examination:   BP 105/64   Pulse (!) 112   Ht 5\' 9"  (1.753 m)   BMI 27.32 kg/m   General: Well-nourished,  well-developed in no acute distress.  Eyes: No icterus. Conjunctivae pink. Mouth: Oropharyngeal mucosa moist and pink , no lesions erythema or exudate. Lungs: Clear to auscultation bilaterally. Non-labored. Heart: Regular rate and rhythm, no murmurs rubs or gallops.  Abdomen: Bowel sounds are normal, nontender, nondistended, no hepatosplenomegaly or masses, no abdominal bruits or hernia , no rebound or guarding.   Extremities:Rt BKA Neuro: Alert and oriented x 3.  Grossly intact. Skin: Warm and dry, no jaundice.   Psych: Alert and cooperative, normal mood and affect.   Imaging Studies: Vas [place] Lower Extremity Arterial Duplex  Result Date: 06/24/2018 LOWER EXTREMITY ARTERIAL DUPLEX STUDY Indications: Gangrene, peripheral artery disease, and 06/02/2018 - Right BKA.  Vascular Interventions: 10/25/17: Bilateral CIA stents with right external iliac                         artery PTA. Current ABI:            N/A Limitations: Checking Right leg for flow to stump end.              Slow healing post op Comparison Study: none Performing Technologist: Concha Norway RVT  Examination Guidelines: A complete evaluation includes B-mode imaging, spectral Doppler, color Doppler, and power Doppler as needed of all accessible portions of each vessel. Bilateral testing is considered an integral part of a complete examination. Limited examinations for reoccurring indications may be performed as noted.  Right Duplex Findings: +----------+--------+-----+--------+---------+--------+           PSV cm/sRatioStenosisWaveform Comments +----------+--------+-----+--------+---------+--------+ EIA Distal95                   triphasic         +----------+--------+-----+--------+---------+--------+ CFA Mid   98                   triphasic         +----------+--------+-----+--------+---------+--------+ DFA       84                   biphasic          +----------+--------+-----+--------+---------+--------+ SFA  Prox  70                   triphasic         +----------+--------+-----+--------+---------+--------+ SFA Mid   58                   triphasic         +----------+--------+-----+--------+---------+--------+ SFA Distal53                   triphasic         +----------+--------+-----+--------+---------+--------+ POP Prox  21                   triphasic         +----------+--------+-----+--------+---------+--------+  Summary: Right: Normal triphasic flow to stump end proximal popliteal artery. No plaque seen.  See table(s) above for measurements and observations. Electronically signed by Leotis Pain MD on 06/24/2018 at 2:02:46 PM.    Final     Assessment and Plan:   [name] is a 62 y.o. y/o male here to followup  for iron deficiency anemia. No overt blood loss. EGD+colonoscopy showed no abnormality to explain the anemia . On IV iron. 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 Hb 06/13/18 : 10 grams.   Plan  1. Discussed options of enteroscopy and cautery  of the AVM- he prefers to watch and see- if Hb drops or if stool turns black would like to pursue enteroscopy at that time.  2. Suggest to stay on Pepcid long term as he is on anticoagulation    Dr [name]  MD,MRCP [facility] Follow up in 4 months

## 2018-07-19 NOTE — Progress Notes (Signed)
Patient's Name: Kaveon Blatz.  MRN: [id number]  Referring Provider: [name], DO  DOB: Nov 23, 1955  PCP: [name], DO  DOS: 07/20/2018  Note by: Gaspar Cola, MD  Service setting: Ambulatory outpatient  Specialty: Interventional Pain Management  Location: [facility] (AMB) Pain Management Facility    Patient type: Established   Primary Reason(s) for Visit: Encounter for evaluation before starting new chronic pain management plan of care (Level of risk: moderate) CC: Knee Pain  HPI  Mr. Dayley is a 62 y.o. year old, male patient, who comes today for a follow-up evaluation to review the test results and decide on a treatment plan. 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 His primarily concern today is the Knee Pain  Pain Assessment: Location: Right Knee(pain is in the right knee and stump) Radiating: Denies Onset: More than a month ago Duration: Chronic pain Quality: Aching, Tender Severity: 5 /10 (subjective, self-reported pain score)  Note: Reported level is compatible with observation.                         When using our objective Pain Scale, levels between 6 and 10/10 are said to belong in an emergency room, as it progressively worsens from a 6/10, described as severely limiting, requiring emergency care not usually  available at an outpatient pain management facility. At a 6/10 level, communication becomes difficult and requires great effort. Assistance to reach the emergency department may be required. Facial flushing and profuse sweating along with potentially dangerous increases in heart rate and blood pressure will be evident. Effect on ADL: pain limits my daily activities, unable to sleep at night, and pain is unbearble at times Timing: Constant Modifying factors: nothing, having physcial therapy at home BP: 109/73  HR: 83  Mr. Holroyd comes in today for a follow-up visit after his initial evaluation on 07/11/2018. Today we went over the results of his tests. These were explained in "Layman's terms". During today's appointment we went over my diagnostic impression, as well as the proposed treatment plan.  According to the patient his primary area of pain is in his right lower leg.  He is s/p right below the knee amputation on June 02, 2018.  He is currently in physical therapy.  He feels like this makes his pain worse.  The evaluation reveals that the patient is not having phantom limb sensation or phantom limb pain.  He is having pain in the area of the stump.  Physical examination today was consistent with possible ongoing infection of the below-knee amputation stump.  The patient seems to have dry gangrene of the skin flap.  The last CBC done showed an elevated white blood cell count with a shift.  Today I have requested a follow-up CBC with differential since the patient was started on some antibiotics after prior CBC results.  At this point, it is my impression that this is a subacute type of pain, which is  not covered within the things that we offer.  To assist this patient with his current pain, I have provided him with a two-week prescription of opioids.  I have also been very clear that this in no way constitutes the beginning of any type of long-term treatment in this facility.  The fact of the  matter is that today we have not signed any medication agreements and it is not my intention to continue this opioids long-term.  The patient was instructed to go back to the surgeon for further evaluation and treatment of this postsurgical subacute pain.  In considering the treatment plan options, Mr. Boyte was reminded that I no longer take patients for medication management only. I asked him to let me know if he had no intention of taking advantage of the interventional therapies, so that we could make arrangements to provide this space to someone interested. I also made it clear that undergoing interventional therapies for the purpose of getting pain medications is very inappropriate on the part of a patient, and it will not be tolerated in this practice. This type of behavior would suggest true addiction and therefore it requires referral to an addiction specialist.   Further details on both, my assessment(s), as well as the proposed treatment plan, please see below.  Controlled Substance Pharmacotherapy Assessment REMS (Risk Evaluation and Mitigation Strategy)  Analgesic: Oxycodone IR 5 mg 1 tablet p.o. every 6 hours PRN for pain.  (20 mg/day of oxycodone) (limited prescription provided to the patient today with no intention of long-term medication management.) Highest recorded MME/day: 60 mg/day MME/day: 30 mg/day Pill Count: None expected due to no prior prescriptions written by our practice. No notes on file Pharmacokinetics: Liberation and absorption (onset of action): WNL Distribution (time to peak effect): WNL Metabolism and excretion (duration of action): WNL         Pharmacodynamics: Desired effects: Analgesia: Mr. Orvis reports >50% benefit. Functional ability: Patient reports that medication allows him to accomplish basic ADLs Clinically meaningful improvement in function (CMIF): Sustained CMIF goals met Perceived effectiveness: Described as relatively effective, allowing  for increase in activities of daily living (ADL) Undesirable effects: Side-effects or Adverse reactions: None reported Monitoring: [place] PMP: Online review of the past 47-monthperiod previously conducted. Not applicable at this point since we have not taken over the patient's medication management yet. List of other Serum/Urine Drug Screening Test(s):  Lab Results  Component Value Date   COCAINSCRNUR NONE DETECTED 06/21/2017   THCU NONE DETECTED 06/21/2017   ETH <10 06/21/2017   List of all UDS test(s) done:  Lab Results  Component Value Date   SUMMARY FINAL 07/11/2018   Last UDS on record: Summary  Date Value Ref Range Status  07/11/2018 FINAL  Final    Comment:    ==================================================================== TOXASSURE COMP DRUG ANALYSIS,UR ==================================================================== Test                             Result       Flag       Units Drug Present and Declared for Prescription Verification   Gabapentin                     PRESENT      EXPECTED   Acetaminophen                  PRESENT      EXPECTED   Metoprolol  PRESENT      EXPECTED Drug Absent but Declared for Prescription Verification   Salicylate                     Not Detected UNEXPECTED    Aspirin, as indicated in the declared medication list, is not    always detected even when used as directed. ==================================================================== Test                      Result    Flag   Units      Ref Range   Creatinine              36               mg/dL      >=20 ==================================================================== Declared Medications:  The flagging and interpretation on this report are based on the  following declared medications.  Unexpected results may arise from  inaccuracies in the declared medications.  **Note: The testing scope of this panel includes these medications:  Gabapentin (Neurontin)   Metoprolol (Lopressor)  **Note: The testing scope of this panel does not include small to  moderate amounts of these reported medications:  Acetaminophen (Tylenol)  Aspirin (Aspirin 81)  **Note: The testing scope of this panel does not include following  reported medications:  Apixaban (Eliquis)  Ascorbic Acid  Doxycycline (Vibramycin)  Empagliflozin (Jardiance)  Ezetimibe (Vytorin)  Hydrochlorothiazide (Microzide)  Iron (Ferrous Sulfate)  Metformin (Glucophage)  Multivitamin (MVI)  Simvastatin (Vytorin)  Topical  Vitamin B (Biotin) ==================================================================== For clinical consultation, please call [phone]. ====================================================================    UDS interpretation: Unexpected findings not considered significantly abnormal.          Medication Assessment Form: Not applicable. Treatment compliance: Not applicable Risk Assessment Profile: Aberrant behavior: See initial evaluations. None observed or detected today Comorbid factors increasing risk of overdose: See initial evaluation. No additional risks detected today Opioid risk tool (ORT) (Total Score): 3 Personal History of Substance Abuse (SUD-Substance use disorder):  Alcohol: Positive Male or Male  Illegal Drugs: Negative  Rx Drugs: Negative  ORT Risk Level calculation: Low Risk Risk of substance use disorder (SUD): Low Opioid Risk Tool - 07/20/18 1357      Family History of Substance Abuse   Alcohol  Negative    Illegal Drugs  Negative    Rx Drugs  Negative      Personal History of Substance Abuse   Alcohol  Positive Male or Male    Illegal Drugs  Negative    Rx Drugs  Negative      Age   Age between 78-45 years   No      History of Preadolescent Sexual Abuse   History of Preadolescent Sexual Abuse  Negative or Male      Psychological Disease   Psychological Disease  Negative    Depression  Negative      Total Score    Opioid Risk Tool Scoring  3    Opioid Risk Interpretation  Low Risk      ORT Scoring interpretation table:  Score <3 = Low Risk for SUD  Score between 4-7 = Moderate Risk for SUD  Score >8 = High Risk for Opioid Abuse   Risk Mitigation Strategies:  Patient opioid safety counseling: Not applicable. Patient-Prescriber Agreement (PPA): No agreement signed.  Controlled substance notification to other providers: Not applicable  Pharmacologic Plan: We have been very clear with the patient that we will not be  taking over his medication management but in order to assist him with his current pain, we have provided him with 14 days of opioid analgesics with absolutely no intention of refilling those or going into long-term medication management. The patient seems to have a postoperative subacute pain condition, which does not fall within the spectrum of services provided by this clinic.  This patient's condition seems to be directly associated with an ongoing stump infection.  Laboratory Chemistry  Inflammation Markers (CRP: Acute Phase) (ESR: Chronic Phase) Lab Results  Component Value Date   CRP 8 07/11/2018   ESRSEDRATE 81 (H) 07/11/2018                         Rheumatology Markers No results found.  Renal Function Markers Lab Results  Component Value Date   BUN 14 06/04/2018   CREATININE 0.70 06/04/2018   BCR 16 05/06/2018   GFRAA >60 06/04/2018   GFRNONAA >60 06/04/2018                             Hepatic Function Markers Lab Results  Component Value Date   AST 15 05/06/2018   ALT 13 05/06/2018   ALBUMIN 4.2 05/06/2018   ALKPHOS 82 05/06/2018                        Electrolytes Lab Results  Component Value Date   NA 137 06/04/2018   K 3.7 06/04/2018   CL 102 06/04/2018   CALCIUM 8.7 (L) 06/04/2018   MG 1.9 07/11/2018                        Neuropathy Markers Lab Results  Component Value Date   VITAMINB12 529 07/11/2018   FOLATE 16.3 04/13/2018   HGBA1C 7.6 (H)  06/22/2017   HIV Non Reactive 06/22/2017                        CNS Tests No results found.  Bone Pathology Markers Lab Results  Component Value Date   25OHVITD1 26 (L) 07/11/2018   25OHVITD2 <1.0 07/11/2018   25OHVITD3 26 07/11/2018                         Coagulation Parameters Lab Results  Component Value Date   INR 1.10 06/02/2018   LABPROT 14.1 06/02/2018   APTT 74 (H) 05/26/2018   PLT 265 06/13/2018   LABHEMA WILL FOLLOW 11/02/2016                        Cardiovascular Markers Lab Results  Component Value Date   TROPONINI <0.03 06/22/2017   HGB 10.0 (L) 06/13/2018   HCT 32.9 (L) 06/13/2018                         CA Markers No results found.  Note: Lab results reviewed.  Recent Diagnostic Imaging Review  Foot Imaging: Foot-R DG Complete:  Results for orders placed during the hospital encounter of 03/03/18  DG Foot Complete Right   Narrative CLINICAL DATA:  Pain and discoloration in the right toes.  EXAM: RIGHT FOOT COMPLETE - 3+ VIEW  COMPARISON:  None.  FINDINGS: There is no evidence of fracture or dislocation. There is no evidence of arthropathy or other  focal bone abnormality. Dorsal soft tissue swelling of the midfoot.  IMPRESSION: No evidence of acute osseus abnormality.  Dorsal soft tissue swelling of the midfoot.   Electronically Signed   By: Fidela Salisbury M.D.   On: 03/03/2018 16:07    Complexity Note: Imaging results reviewed. Results shared with Mr. Colina, using Layman's terms.                         Meds   Current Outpatient Medications:  .  acetaminophen (TYLENOL) 500 MG tablet, Take 1,000 mg by mouth every 6 (six) hours as needed (for pain.)., Disp: , Rfl:  .  apixaban (ELIQUIS) 5 MG TABS tablet, Take 1 tablet (5 mg total) by mouth 2 (two) times daily., Disp: 180 tablet, Rfl: 1 .  aspirin 81 MG EC tablet, Take 1 tablet (81 mg total) by mouth daily., Disp: 90 tablet, Rfl: 4 .  Biotin (BIOTIN 5000) 5 MG CAPS, Take 1  capsule by mouth daily., Disp: , Rfl:  .  cholecalciferol (VITAMIN D3) 25 MCG (1000 UT) tablet, Take 1,000 Units by mouth daily., Disp: , Rfl:  .  donepezil (ARICEPT) 5 MG tablet, Take 5 mg by mouth at bedtime., Disp: , Rfl:  .  doxycycline (VIBRAMYCIN) 100 MG capsule, Take 1 capsule (100 mg total) by mouth 2 (two) times daily., Disp: 28 capsule, Rfl: 0 .  empagliflozin (JARDIANCE) 10 MG TABS tablet, Take 5 mg by mouth daily., Disp: 90 tablet, Rfl: 1 .  ezetimibe-simvastatin (VYTORIN) 10-40 MG tablet, Take 1 tablet by mouth daily at 6 PM., Disp: 90 tablet, Rfl: 3 .  ferrous sulfate (FERROUSUL) 325 (65 FE) MG tablet, Take 1 tablet (325 mg total) by mouth daily with breakfast., Disp: 90 tablet, Rfl: 3 .  gabapentin (NEURONTIN) 400 MG capsule, Take 2 capsules (800 mg total) by mouth 3 (three) times daily., Disp: 540 capsule, Rfl: 1 .  lansoprazole (PREVACID) 30 MG capsule, Take 30 mg by mouth daily at 12 noon., Disp: , Rfl:  .  metFORMIN (GLUCOPHAGE) 1000 MG tablet, Take 1 tablet (1,000 mg total) by mouth 2 (two) times daily with a meal., Disp: 180 tablet, Rfl: 3 .  metoprolol tartrate (LOPRESSOR) 25 MG tablet, Take 1 tablet (25 mg total) by mouth 2 (two) times daily., Disp: 180 tablet, Rfl: 3 .  Multiple Vitamin (MULTIVITAMIN WITH MINERALS) TABS tablet, Take 1 tablet by mouth daily., Disp: 30 tablet, Rfl: 0 .  silver sulfADIAZINE (SILVADENE) 1 % cream, Apply 1 application topically daily. Apply to stump daily., Disp: 50 g, Rfl: 5 .  vitamin B-12 (CYANOCOBALAMIN) 1000 MCG tablet, Take 1,000 mcg by mouth daily., Disp: , Rfl:  .  vitamin C (ASCORBIC ACID) 500 MG tablet, Take 500 mg by mouth daily., Disp: , Rfl:  .  hydrochlorothiazide (MICROZIDE) 12.5 MG capsule, Take 1 capsule (12.5 mg total) by mouth daily., Disp: 90 capsule, Rfl: 3 .  [START ON 07/27/2018] oxyCODONE (OXY IR/ROXICODONE) 5 MG immediate release tablet, Take 1 tablet (5 mg total) by mouth every 6 (six) hours as needed for up to 7 days for  severe pain. Must last 7 days., Disp: 28 tablet, Rfl: 0 .  oxyCODONE (OXY IR/ROXICODONE) 5 MG immediate release tablet, Take 1 tablet (5 mg total) by mouth every 6 (six) hours as needed for up to 7 days for severe pain. Must last 7 days., Disp: 28 tablet, Rfl: 0  ROS  Constitutional: Denies any fever or chills Gastrointestinal: No reported  hemesis, hematochezia, vomiting, or acute GI distress Musculoskeletal: Denies any acute onset joint swelling, redness, loss of ROM, or weakness Neurological: No reported episodes of acute onset apraxia, aphasia, dysarthria, agnosia, amnesia, paralysis, loss of coordination, or loss of consciousness  Allergies  Mr. Sizelove is allergic to sodium pentobarbital [pentobarbital] and lipitor [atorvastatin].  PFSH  Drug: Mr. Vantrease  reports no history of drug use. Alcohol:  reports previous alcohol use of about 8.0 standard drinks of alcohol per week. Tobacco:  reports that he quit smoking about 6 months ago. His smoking use included cigarettes. He has a 30.00 pack-year smoking history. He has never used smokeless tobacco. Medical:  has a past medical history of Acute left PCA stroke ([place]) (02/17/2015), Anemia, Atherosclerosis of native arteries of the extremities with gangrene ([place]) (05/08/2018), Atrial fibrillation ([place]), Blind, Diabetes mellitus with complication ([place]), Dilated aortic root ([place]), Dysrhythmia, Gangrene of right foot ([place]) (06/02/2018), History of echocardiogram, History of hernia repair, History of stress test, Hyperlipidemia, Hypertension, Leg pain, Legally blind, Necrotic toes ([place]) (02/21/2018), PAD (peripheral artery disease) ([place]), PAF (paroxysmal atrial fibrillation) ([place]), Peripheral vascular disease ([place]), Pulmonary nodules, Stroke ([place]), and Stroke ([place]) (07/11/2018). Surgical: Mr. Yom  has a past surgical history that includes Appendectomy; Lower Extremity Angiography (Right, 10/25/2017); LOWER EXTREMITY INTERVENTION (10/25/2017); Hernia  repair; Tonsillectomy; Lower Extremity Angiography (Right, 01/13/2018); Colonoscopy with propofol (N/A, 05/12/2018); Esophagogastroduodenoscopy (egd) with propofol (N/A, 05/12/2018); Amputation (Right, 06/02/2018); and Givens capsule study (N/A, 07/13/2018). Family: family history includes Diabetes in his father; Hyperlipidemia in his father; Hypertension in his father.  Constitutional Exam  General appearance: Well nourished, well developed, and well hydrated. In no apparent acute distress Vitals:   07/20/18 1337  BP: 109/73  Pulse: 83  Temp: 98 F (36.7 C)  SpO2: 97%  Weight: 185 lb (83.9 kg)  Height: 5' 10"  (1.778 m)   BMI Assessment: Estimated body mass index is 26.54 kg/m as calculated from the following:   Height as of this encounter: 5' 10"  (1.778 m).   Weight as of this encounter: 185 lb (83.9 kg).  BMI interpretation table: BMI level Category Range association with higher incidence of chronic pain  <18 kg/m2 Underweight   18.5-24.9 kg/m2 Ideal body weight   25-29.9 kg/m2 Overweight Increased incidence by 20%  30-34.9 kg/m2 Obese (Class I) Increased incidence by 68%  35-39.9 kg/m2 Severe obesity (Class II) Increased incidence by 136%  >40 kg/m2 Extreme obesity (Class III) Increased incidence by 254%   Patient's current BMI Ideal Body weight  Body mass index is 26.54 kg/m. Ideal body weight: 73 kg (160 lb 15 oz) Adjusted ideal body weight: 77.4 kg (170 lb 9 oz)   BMI Readings from Last 4 Encounters:  07/20/18 26.54 kg/m  07/18/18 27.32 kg/m  07/11/18 27.32 kg/m  07/08/18 27.32 kg/m   Wt Readings from Last 4 Encounters:  07/20/18 185 lb (83.9 kg)  07/11/18 185 lb (83.9 kg)  07/08/18 185 lb (83.9 kg)  06/27/18 180 lb (81.6 kg)  Psych/Mental status: Alert, oriented x 3 (person, place, & time)       Eyes: PERLA Respiratory: No evidence of acute respiratory distress  Cervical Spine Area Exam  Skin & Axial Inspection: No masses, redness, edema, swelling, or  associated skin lesions Alignment: Symmetrical Functional ROM: Unrestricted ROM      Stability: No instability detected Muscle Tone/Strength: Functionally intact. No obvious neuro-muscular anomalies detected. Sensory (Neurological): Unimpaired Palpation: No palpable anomalies  Upper Extremity (UE) Exam    Side: Right upper extremity  Side: Left upper extremity  Skin & Extremity Inspection: Skin color, temperature, and hair growth are WNL. No peripheral edema or cyanosis. No masses, redness, swelling, asymmetry, or associated skin lesions. No contractures.  Skin & Extremity Inspection: Skin color, temperature, and hair growth are WNL. No peripheral edema or cyanosis. No masses, redness, swelling, asymmetry, or associated skin lesions. No contractures.  Functional ROM: Unrestricted ROM          Functional ROM: Unrestricted ROM          Muscle Tone/Strength: Functionally intact. No obvious neuro-muscular anomalies detected.  Muscle Tone/Strength: Functionally intact. No obvious neuro-muscular anomalies detected.  Sensory (Neurological): Unimpaired          Sensory (Neurological): Unimpaired          Palpation: No palpable anomalies              Palpation: No palpable anomalies              Provocative Test(s):  Phalen's test: deferred Tinel's test: deferred Apley's scratch test (touch opposite shoulder):  Action 1 (Across chest): deferred Action 2 (Overhead): deferred Action 3 (LB reach): deferred   Provocative Test(s):  Phalen's test: deferred Tinel's test: deferred Apley's scratch test (touch opposite shoulder):  Action 1 (Across chest): deferred Action 2 (Overhead): deferred Action 3 (LB reach): deferred    Thoracic Spine Area Exam  Skin & Axial Inspection: No masses, redness, or swelling Alignment: Symmetrical Functional ROM: Unrestricted ROM Stability: No instability detected Muscle Tone/Strength: Functionally intact. No obvious neuro-muscular anomalies  detected. Sensory (Neurological): Unimpaired Muscle strength & Tone: No palpable anomalies  Lumbar Spine Area Exam  Skin & Axial Inspection: No masses, redness, or swelling Alignment: Symmetrical Functional ROM: Unrestricted ROM       Stability: No instability detected Muscle Tone/Strength: Functionally intact. No obvious neuro-muscular anomalies detected. Sensory (Neurological): Unimpaired Palpation: No palpable anomalies       Provocative Tests: Hyperextension/rotation test: deferred today       Lumbar quadrant test ([name]'s test): deferred today       Lateral bending test: deferred today       Patrick's Maneuver: deferred today                   FABER* test: deferred today                   S-I anterior distraction/compression test: deferred today         S-I lateral compression test: deferred today         S-I Thigh-thrust test: deferred today         S-I Gaenslen's test: deferred today         *(Flexion, ABduction and External Rotation)  Gait & Posture Assessment  Ambulation: Patient ambulates using a wheel chair Gait: Significantly limited. Dependent on assistive device to ambulate Posture: WNL   Lower Extremity Exam    Side: Right lower extremity  Side: Left lower extremity  Stability: No instability observed          Stability: No instability observed          Skin & Extremity Inspection: Below knee amputation (BKA).  There appears to be redness around the surgical area with what appears to be dry gangrene of the skin flap covering the stump.  Skin & Extremity Inspection: Skin color, temperature, and hair growth are WNL. No peripheral edema or cyanosis. No masses,  redness, swelling, asymmetry, or associated skin lesions. No contractures.  Functional ROM: Unrestricted ROM for hip joint          Functional ROM: Unrestricted ROM                  Muscle Tone/Strength: Functionally intact. No obvious neuro-muscular anomalies detected.  Muscle Tone/Strength: Functionally intact.  No obvious neuro-muscular anomalies detected.  Sensory (Neurological): Unimpaired        Sensory (Neurological): Unimpaired        DTR: Patellar: deferred today Achilles: deferred today Plantar: deferred today  DTR: Patellar: deferred today Achilles: deferred today Plantar: deferred today  Palpation: Complains of area being tender to palpation  Palpation: No palpable anomalies   Assessment & Plan  Primary Diagnosis & Pertinent Problem List: The primary encounter diagnosis was Amputation stump infection ([place]). A diagnosis of Stump pain ([place]) (Right) was also pertinent to this visit.  Visit Diagnosis: 1. Amputation stump infection ([place])   2. Stump pain ([place]) (Right)    Problems updated and reviewed during this visit: Problem  Stump pain ([place]) (Right)  Amputation Stump Infection ([name])    Plan of Care  Pharmacotherapy (Medications Ordered): Meds ordered this encounter  Medications  . oxyCODONE (OXY IR/ROXICODONE) 5 MG immediate release tablet    Sig: Take 1 tablet (5 mg total) by mouth every 6 (six) hours as needed for up to 7 days for severe pain. Must last 7 days.    Dispense:  28 tablet    Refill:  0    [place] STOP ACT - Not applicable. Fill one day early if pharmacy is closed on scheduled refill date. Do not fill until: 07/27/18 . Must last 7 days. To last until: 08/03/18.  Marland Kitchen oxyCODONE (OXY IR/ROXICODONE) 5 MG immediate release tablet    Sig: Take 1 tablet (5 mg total) by mouth every 6 (six) hours as needed for up to 7 days for severe pain. Must last 7 days.    Dispense:  28 tablet    Refill:  0    [place] STOP ACT - Not applicable. Fill one day early if pharmacy is closed on scheduled refill date. Do not fill until: 07/20/18 . Must last 7 days. To last until: 07/27/18.   Procedure Orders    No procedure(s) ordered today    Lab Orders     CBC with Differential/Platelet Imaging Orders  No imaging studies ordered today   Referral Orders  No referral(s) requested today     Pharmacological management options:  Opioid Analgesics: Although I have provided the patient with 14 days worth of opioid analgesics for the treatment of this subacute stump pain, it is not our intention to provide long-term pharmacological management. Membrane stabilizer: Currently on a membrane stabilizer Muscle relaxant: We have discussed the possibility of a trial NSAID: We have discussed the possibility of a trial Other analgesic(s): To be determined at a later time   Interventional management options: Planned, scheduled, and/or pending:    CBC ordered.  The patient is already scheduled to follow-up with the surgeon at the end of this week.  Hopefully the results of the CBC will be available so that they can determine whether or not the current antibiotic therapy has been effective.   Considering:   None at this time.   PRN Procedures:   None at this time   Provider-requested follow-up: Return in about 3 weeks (around 08/10/2018) for F/U eval (after test completion).  Future Appointments  Date Time Provider  [place]  07/22/2018  1:00 PM Kris Hartmann, NP [facility] None  08/10/2018  9:45 AM Milinda Pointer, MD [facility] None  08/11/2018  3:30 PM Park Liter P, DO [facility]  09/13/2018  3:30 PM [facility] None  09/14/2018  1:15 PM Earlie Server, MD [facility] None  09/14/2018  1:45 PM [facility] INFUSION CHAIR 3 [facility] None  10/03/2018  2:00 PM Minna Merritts, MD [facility]  11/17/2018  2:45 PM [name], MD [facility] None  03/17/2019  2:00 PM [facility] VASC 3 [facility] None  03/17/2019  3:00 PM Dew, Erskine Squibb, MD [facility] None    Primary Care Physician: [name], DO Location: [facility] Note by: Gaspar Cola, MD Date: 07/20/2018; Time: 5:02 PM

## 2018-07-20 ENCOUNTER — Other Ambulatory Visit: Payer: Self-pay

## 2018-07-20 ENCOUNTER — Encounter: Payer: Self-pay | Admitting: Pain Medicine

## 2018-07-20 ENCOUNTER — Telehealth (INDEPENDENT_AMBULATORY_CARE_PROVIDER_SITE_OTHER): Payer: Self-pay

## 2018-07-20 ENCOUNTER — Ambulatory Visit: Payer: Medicare HMO | Attending: Pain Medicine | Admitting: Pain Medicine

## 2018-07-20 ENCOUNTER — Other Ambulatory Visit (INDEPENDENT_AMBULATORY_CARE_PROVIDER_SITE_OTHER): Payer: Self-pay | Admitting: Nurse Practitioner

## 2018-07-20 ENCOUNTER — Ambulatory Visit: Payer: Self-pay | Admitting: *Deleted

## 2018-07-20 VITALS — BP 109/73 | HR 83 | Temp 98.0°F | Ht 70.0 in | Wt 185.0 lb

## 2018-07-20 DIAGNOSIS — E1151 Type 2 diabetes mellitus with diabetic peripheral angiopathy without gangrene: Secondary | ICD-10-CM | POA: Insufficient documentation

## 2018-07-20 DIAGNOSIS — M79609 Pain in unspecified limb: Secondary | ICD-10-CM

## 2018-07-20 DIAGNOSIS — Z7901 Long term (current) use of anticoagulants: Secondary | ICD-10-CM | POA: Diagnosis not present

## 2018-07-20 DIAGNOSIS — T8740 Infection of amputation stump, unspecified extremity: Secondary | ICD-10-CM

## 2018-07-20 DIAGNOSIS — Z89511 Acquired absence of right leg below knee: Secondary | ICD-10-CM

## 2018-07-20 DIAGNOSIS — I70209 Unspecified atherosclerosis of native arteries of extremities, unspecified extremity: Secondary | ICD-10-CM | POA: Insufficient documentation

## 2018-07-20 DIAGNOSIS — Z89512 Acquired absence of left leg below knee: Principal | ICD-10-CM

## 2018-07-20 DIAGNOSIS — M25561 Pain in right knee: Secondary | ICD-10-CM | POA: Diagnosis not present

## 2018-07-20 DIAGNOSIS — G894 Chronic pain syndrome: Secondary | ICD-10-CM | POA: Diagnosis not present

## 2018-07-20 DIAGNOSIS — I739 Peripheral vascular disease, unspecified: Secondary | ICD-10-CM | POA: Diagnosis not present

## 2018-07-20 DIAGNOSIS — E1143 Type 2 diabetes mellitus with diabetic autonomic (poly)neuropathy: Secondary | ICD-10-CM | POA: Insufficient documentation

## 2018-07-20 DIAGNOSIS — T8789 Other complications of amputation stump: Secondary | ICD-10-CM | POA: Diagnosis not present

## 2018-07-20 MED ORDER — OXYCODONE HCL 5 MG PO TABS: 5.0000 mg | ORAL_TABLET | Freq: Four times a day (QID) | ORAL | 0 refills | Status: DC | PRN

## 2018-07-20 NOTE — Patient Instructions (Signed)
You were given 2 prescriptions for Oxycodone today. 

## 2018-07-20 NOTE — Telephone Encounter (Signed)
That would be a good idea. I put in for an x-ray order.  Hopefully we can get it before his visit.  Radiology should be contacting him to come in for a visit.

## 2018-07-20 NOTE — Telephone Encounter (Signed)
Patients Wife called and left a message on the triage line and stated that her husband was seen today at pain management and they stated he may have a bone infection and so the patient is wondering if we feel he needs an xray before the holidays. Vallery Sa, NP on 07/22/18 for a staple removal. Please advise, thank you

## 2018-07-20 NOTE — Telephone Encounter (Signed)
Pt's wife called regarding the patient's prescription for gabapentin. She is stating that the insurance will not pay for the 540 caps. She is requesting that he gets 800 mg of gabapentin instead of 400 mg written for him. She is requesting a call back.  Reason for Disposition . Caller has NON-URGENT medication question about med that PCP prescribed and triager unable to answer question  Answer Assessment - Initial Assessment Questions 1. SYMPTOMS: "Do you have any symptoms?"     no 2. SEVERITY: If symptoms are present, ask "Are they mild, moderate or severe?"     no  Protocols used: MEDICATION QUESTION CALL-A-AH

## 2018-07-20 NOTE — Telephone Encounter (Signed)
Called and spoke with Patients spouse she is aware and stated they will be waiting for the call to get that done before the appointment

## 2018-07-21 LAB — CBC WITH DIFFERENTIAL/PLATELET
BASOS ABS: 0.1 10*3/uL (ref 0.0–0.2)
Basos: 1 %
EOS (ABSOLUTE): 0.4 10*3/uL (ref 0.0–0.4)
Eos: 4 %
Hematocrit: 39.5 % (ref 37.5–51.0)
Hemoglobin: 12.2 g/dL — ABNORMAL LOW (ref 13.0–17.7)
IMMATURE GRANULOCYTES: 0 %
Immature Grans (Abs): 0 10*3/uL (ref 0.0–0.1)
Lymphocytes Absolute: 1.9 10*3/uL (ref 0.7–3.1)
Lymphs: 22 %
MCH: 25.4 pg — ABNORMAL LOW (ref 26.6–33.0)
MCHC: 30.9 g/dL — ABNORMAL LOW (ref 31.5–35.7)
MCV: 82 fL (ref 79–97)
MONOCYTES: 6 %
Monocytes Absolute: 0.5 10*3/uL (ref 0.1–0.9)
Neutrophils Absolute: 5.7 10*3/uL (ref 1.4–7.0)
Neutrophils: 67 %
Platelets: 290 10*3/uL (ref 150–450)
RBC: 4.8 x10E6/uL (ref 4.14–5.80)
RDW: 16.7 % — ABNORMAL HIGH (ref 12.3–15.4)
WBC: 8.5 10*3/uL (ref 3.4–10.8)

## 2018-07-22 ENCOUNTER — Ambulatory Visit (INDEPENDENT_AMBULATORY_CARE_PROVIDER_SITE_OTHER): Payer: Medicare HMO | Admitting: Nurse Practitioner

## 2018-07-22 ENCOUNTER — Encounter (INDEPENDENT_AMBULATORY_CARE_PROVIDER_SITE_OTHER): Payer: Self-pay | Admitting: Nurse Practitioner

## 2018-07-22 ENCOUNTER — Telehealth: Payer: Self-pay | Admitting: Family Medicine

## 2018-07-22 VITALS — BP 107/72 | HR 85 | Resp 12

## 2018-07-22 DIAGNOSIS — Z89511 Acquired absence of right leg below knee: Secondary | ICD-10-CM

## 2018-07-22 DIAGNOSIS — I70209 Unspecified atherosclerosis of native arteries of extremities, unspecified extremity: Secondary | ICD-10-CM

## 2018-07-22 DIAGNOSIS — E1151 Type 2 diabetes mellitus with diabetic peripheral angiopathy without gangrene: Secondary | ICD-10-CM

## 2018-07-22 DIAGNOSIS — R918 Other nonspecific abnormal finding of lung field: Secondary | ICD-10-CM

## 2018-07-22 DIAGNOSIS — F17211 Nicotine dependence, cigarettes, in remission: Secondary | ICD-10-CM

## 2018-07-22 DIAGNOSIS — E782 Mixed hyperlipidemia: Secondary | ICD-10-CM

## 2018-07-22 DIAGNOSIS — T879 Unspecified complications of amputation stump: Secondary | ICD-10-CM

## 2018-07-22 MED ORDER — GABAPENTIN 800 MG PO TABS: 800.0000 mg | ORAL_TABLET | Freq: Three times a day (TID) | ORAL | 1 refills | Status: DC

## 2018-07-22 NOTE — Telephone Encounter (Signed)
Patients wife notified

## 2018-07-22 NOTE — Telephone Encounter (Signed)
Please let him know that he's due for his follow up CT of his chest. I've got the order in and they should be calling him to get it set up. Thanks!

## 2018-07-22 NOTE — Telephone Encounter (Signed)
-----   Message from [name], DO sent at 04/21/2018  2:51 PM EDT ----- Needs follow up CT of his chest for nodules

## 2018-07-22 NOTE — Addendum Note (Signed)
Addended by: [name] on: 07/22/2018 04:44 PM   Modules accepted: Orders

## 2018-07-22 NOTE — Progress Notes (Signed)
Subjective:    Patient ID: [name]., male    DOB: 1956/02/14, 62 y.o.   MRN: [id number] Chief Complaint  Patient presents with  . Follow-up    HPI  [name]. is a 62 y.o. male that presents today for evaluation of his right lower extremity below-knee amputation.  On his last visit he had about one third of the staples removed from his stump, and there was some foul-smelling drainage present after several staples were removed.  Today, there is some areas of oozing blood as well as purulent appearing drainage.  His wife states that generally the drainage is much greater.  The majority of the stump is covered with a large eschar-like covering.  It is hard to the touch with no wound bed underneath.  There is no bruising on the bottom portion of his stump like before.  The patient states that at this point he has 1 doxycycline left.  Patient denies any fever, chills, nausea, vomiting or diarrhea.  Patient denies any chest pain or shortness of breath.  Patient denies any rest pain or claudication-like symptoms.  Past Medical History:  Diagnosis Date  . Acute left PCA stroke ([place]) 02/17/2015  . Anemia   . Atherosclerosis of native arteries of the extremities with gangrene ([place]) 05/08/2018  . Atrial fibrillation ([name])   . Blind    right eye  . Diabetes mellitus with complication ([name])   . Dilated aortic root ([name])    a. 04/2018 Echo: 4.1cm. Asc Ao 3.5cm.  Marland Kitchen Dysrhythmia   . Gangrene of right foot ([place]) 06/02/2018  . History of echocardiogram    a. 04/2018 Echo: Ef 60-65%, no rwma, midly to mod dil Ao root - 4.1cm. Asc Ao 3.5cm. Mild MR. Nl RV fxn. Nl PASP.  Marland Kitchen History of hernia repair   . History of stress test    a. 2016 MV ([name]): EF 58%, no ischemia.  . Hyperlipidemia   . Hypertension   . Leg pain   . Legally blind   . Necrotic toes ([name]) 02/21/2018  . PAD (peripheral artery disease) ([place])   . PAF (paroxysmal atrial fibrillation) ([name])    a. on Eliquis as of 2018; b.  CHADS2VASc => 5 (HTN, DM, stroke x 2, vascular disease)  . Peripheral vascular disease ([name])    a. followed by Dr. Lucky Cowboy; b. s/p kissing balloon stents and right external iliac stent in 10/2017; c. LE angiogrpahy 01/2018: No significant arterial occlusive disease in the lower extremities.  . Pulmonary nodules   . Stroke [facility])    a. 2016 & 2018  . Stroke [facility]) 07/11/2018    Past Surgical History:  Procedure Laterality Date  . AMPUTATION Right 06/02/2018   Procedure: AMPUTATION BELOW KNEE;  Surgeon: [name], MD;  Location: [facility] ORS;  Service: Vascular;  Laterality: Right;  . APPENDECTOMY    . COLONOSCOPY WITH PROPOFOL N/A 05/12/2018   Procedure: COLONOSCOPY WITH PROPOFOL;  Surgeon: [name], MD;  Location[facility] ENDOSCOPY;  Service: Gastroenterology;  Laterality: N/A;  . ESOPHAGOGASTRODUODENOSCOPY (EGD) WITH PROPOFOL N/A 05/12/2018   Procedure: ESOPHAGOGASTRODUODENOSCOPY (EGD) WITH PROPOFOL;  Surgeon: [name], MD;  Location[facility] END[facility]COPY;  Service: Gastroenterology;  Laterality: N/A;  . GIVENS CAPSULE STUDY N/A 07/13/2018   Procedure: GIVENS CAPSULE STUDY;  Surgeon: [name], MD;  Location[facility] ENDOSCOPY;  Service: Gastroenterology;  Laterality: N/A;  . HERNIA REPAIR     UMBILICAL  . LOWER EXTREMITY ANGIOGRAPHY Right 10/25/2017   Procedure:  LOWER EXTREMITY ANGIOGRAPHY;  Surgeon: [name], MD;  Location: [place] CV LAB;  Service: Cardiovascular;  Laterality: Right;  . LOWER EXTREMITY ANGIOGRAPHY Right 01/13/2018   Procedure: LOWER EXTREMITY ANGIOGRAPHY;  Surgeon: [name], MD;  Location: [place] CV LAB;  Service: Cardiovascular;  Laterality: Right;  . LOWER EXTREMITY INTERVENTION  10/25/2017   Procedure: LOWER EXTREMITY INTERVENTION;  Surgeon: [name], MD;  Location: [place] CV LAB;  Service: Cardiovascular;;  . TONSILLECTOMY      Social History   Socioeconomic History  . Marital status: Married    Spouse name: Not on file  . Number of  children: 0  . Years of education: Not on file  . Highest education level: Not on file  Occupational History  . Not on file  Social Needs  . Financial resource strain: Not on file  . Food insecurity:    Worry: Not on file    Inability: Not on file  . Transportation needs:    Medical: Not on file    Non-medical: Not on file  Tobacco Use  . Smoking status: Former Smoker    Packs/day: 1.00    Years: 30.00    Pack years: 30.00    Types: Cigarettes    Last attempt to quit: 01/09/2018    Years since quitting: 0.5  . Smokeless tobacco: Never Used  Substance and Sexual Activity  . Alcohol use: Not Currently    Alcohol/week: 8.0 standard drinks    Types: 8 Cans of beer per week    Comment: occasionally beer   . Drug use: No  . Sexual activity: Yes  Lifestyle  . Physical activity:    Days per week: Not on file    Minutes per session: Not on file  . Stress: Not on file  Relationships  . Social connections:    Talks on phone: Not on file    Gets together: Not on file    Attends religious service: Not on file    Active member of club or organization: Not on file    Attends meetings of clubs or organizations: Not on file    Relationship status: Not on file  . Intimate partner violence:    Fear of current or ex partner: Not on file    Emotionally abused: Not on file    Physically abused: Not on file    Forced sexual activity: Not on file  Other Topics Concern  . Not on file  Social History Narrative  . Not on file    Family History  Problem Relation Age of Onset  . Diabetes Father   . Hypertension Father   . Hyperlipidemia Father     Allergies  Allergen Reactions  . Sodium Pentobarbital [Pentobarbital] Shortness Of Breath  . Lipitor [Atorvastatin] Rash     Review of Systems   Review of Systems: Negative Unless Checked Constitutional: [] Weight loss  [] Fever  [] Chills Cardiac: [] Chest pain   []  Atrial Fibrillation  [] Palpitations   [] Shortness of breath when laying  flat   [] Shortness of breath with exertion. [] Shortness of breath at rest Vascular:  [] Pain in legs with walking   [] Pain in legs with standing [] Pain in legs when laying flat   [] Claudication    [] Pain in feet when laying flat    [] History of DVT   [] Phlebitis   [] Swelling in legs   [] Varicose veins   [] Non-healing ulcers Pulmonary:   [] Uses home oxygen   [] Productive cough   []   Hemoptysis   [] Wheeze  [] COPD   [] Asthma Neurologic:  [] Dizziness   [] Seizures  [] Blackouts [] History of stroke   [] History of TIA  [] Aphasia   [] Temporary Blindness   [] Weakness or numbness in arm   [x] Weakness or numbness in leg Musculoskeletal:   [] Joint swelling   [] Joint pain   [] Low back pain  []  History of Knee Replacement [] Arthritis [] back Surgeries  []  Spinal Stenosis    Hematologic:  [] Easy bruising  [] Easy bleeding   [] Hypercoagulable state   [] Anemic Gastrointestinal:  [] Diarrhea   [] Vomiting  [] Gastroesophageal reflux/heartburn   [] Difficulty swallowing. [] Abdominal pain Genitourinary:  [] Chronic kidney disease   [] Difficult urination  [] Anuric   [] Blood in urine [] Frequent urination  [] Burning with urination   [] Hematuria Skin:  [] Rashes   [] Ulcers [x] Wounds Psychological:  [] History of anxiety   []  History of major depression  [x]  Memory Difficulties     Objective:   Physical Exam  BP 107/72 (BP Location: Right Arm, Patient Position: Sitting)   Pulse 85   Resp 12   Gen: WD/WN, NAD Head: [place]/AT, No temporalis wasting.  Poor dentition Ear/Nose/Throat: Hearing grossly intact, nares w/o erythema or drainage Eyes: PER, EOMI, sclera nonicteric.  Neck: Supple, no masses.  No JVD.  Pulmonary:  Good air movement, no use of accessory muscles.  Cardiac: RRR Vascular:  Large eschar covering 99% of stump Vessel Right Left  Radial Palpable Palpable   Gastrointestinal: soft, non-distended. No guarding/no peritoneal signs.  Musculoskeletal: M/S 5/5 throughout.  No deformity or atrophy.  Neurologic: Pain and  light touch intact in extremities.  Symmetrical.  Speech is fluent. Motor exam as listed above. Psychiatric: Judgment intact, Mood & affect appropriate for pt's clinical situation. Dermatologic: No Venous rashes. No Ulcers Noted.  No changes consistent with cellulitis. Lymph : No Cervical lymphadenopathy, no lichenification or skin changes of chronic lymphedema.      Assessment & Plan:   1. Complication of below knee amputation stump ([place]) The patient continues to have drainage from several spots on his stump which appear somewhat purulent in nature, despite antibiotic usage.  The bruising that was on the stump has gone however it is covered by a very large eschar/scab.  The concern at this moment is that he may have osteomyelitis in the remaining below-knee amputation.  If osteomyelitis is found, depending on extent of it there may be need for stump revision or extended IV antibiotic usage.  Otherwise if there is no osteomyelitis found, we may need to do a surgical debridement with wound VAC placement.  We will obtain an MRI and based upon the results go from there.  After the MRI the patient will follow-up in the office. - MR TIBIA FIBULA RIGHT W CONTRAST; Future  2. Mixed hyperlipidemia Continue statin as ordered and reviewed, no changes at this time   3. 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  Hgb A1C to be monitored as already arranged by primary service    Current Outpatient Medications on File Prior to Visit  Medication Sig Dispense Refill  . acetaminophen (TYLENOL) 500 MG tablet Take 1,000 mg by mouth every 6 (six) hours as needed (for pain.).    Marland Kitchen apixaban (ELIQUIS) 5 MG TABS tablet Take 1 tablet (5 mg total) by mouth 2 (two) times daily. 180 tablet 1  . aspirin 81 MG EC tablet Take 1 tablet (  81 mg total) by mouth  daily. 90 tablet 4  . Biotin (BIOTIN 5000) 5 MG CAPS Take 1 capsule by mouth daily.    . cholecalciferol (VITAMIN D3) 25 MCG (1000 UT) tablet Take 1,000 Units by mouth daily.    Marland Kitchen donepezil (ARICEPT) 5 MG tablet Take 5 mg by mouth at bedtime.    Marland Kitchen doxycycline (VIBRAMYCIN) 100 MG capsule Take 1 capsule (100 mg total) by mouth 2 (two) times daily. 28 capsule 0  . empagliflozin (JARDIANCE) 10 MG TABS tablet Take 5 mg by mouth daily. 90 tablet 1  . ezetimibe-simvastatin (VYTORIN) 10-40 MG tablet Take 1 tablet by mouth daily at 6 PM. 90 tablet 3  . ferrous sulfate (FERROUSUL) 325 (65 FE) MG tablet Take 1 tablet (325 mg total) by mouth daily with breakfast. 90 tablet 3  . gabapentin (NEURONTIN) 400 MG capsule Take 2 capsules (800 mg total) by mouth 3 (three) times daily. 540 capsule 1  . lansoprazole (PREVACID) 30 MG capsule Take 30 mg by mouth daily at 12 noon.    . metFORMIN (GLUCOPHAGE) 1000 MG tablet Take 1 tablet (1,000 mg total) by mouth 2 (two) times daily with a meal. 180 tablet 3  . metoprolol tartrate (LOPRESSOR) 25 MG tablet Take 1 tablet (25 mg total) by mouth 2 (two) times daily. 180 tablet 3  . Multiple Vitamin (MULTIVITAMIN WITH MINERALS) TABS tablet Take 1 tablet by mouth daily. 30 tablet 0  . oxyCODONE (OXY IR/ROXICODONE) 5 MG immediate release tablet Take 1 tablet (5 mg total) by mouth every 6 (six) hours as needed for up to 7 days for severe pain. Must last 7 days. 28 tablet 0  . silver sulfADIAZINE (SILVADENE) 1 % cream Apply 1 application topically daily. Apply to stump daily. 50 g 5  . vitamin B-12 (CYANOCOBALAMIN) 1000 MCG tablet Take 1,000 mcg by mouth daily.    . vitamin C (ASCORBIC ACID) 500 MG tablet Take 500 mg by mouth daily.    . hydrochlorothiazide (MICROZIDE) 12.5 MG capsule Take 1 capsule (12.5 mg total) by mouth daily. 90 capsule 3   No current facility-administered medications on file prior to visit.     There are no Patient Instructions on file for this visit. No  follow-ups on file.   Kris Hartmann, NP  This note was completed with Sales executive.  Any errors are purely unintentional.

## 2018-07-22 NOTE — Telephone Encounter (Signed)
Gabapentin sent to his pharmacy.

## 2018-07-22 NOTE — Telephone Encounter (Signed)
Called and left a  Detailed message for patient's wife.

## 2018-07-23 ENCOUNTER — Other Ambulatory Visit: Payer: Self-pay | Admitting: Family Medicine

## 2018-07-25 ENCOUNTER — Encounter: Payer: Self-pay | Admitting: Gastroenterology

## 2018-07-25 ENCOUNTER — Ambulatory Visit
Admission: RE | Admit: 2018-07-25 | Discharge: 2018-07-25 | Disposition: A | Discharge status: Home / Self Care | Payer: Medicare HMO | Source: Ambulatory Visit | Attending: Nurse Practitioner | Admitting: Nurse Practitioner

## 2018-07-25 DIAGNOSIS — T879 Unspecified complications of amputation stump: Secondary | ICD-10-CM | POA: Diagnosis not present

## 2018-07-25 DIAGNOSIS — X58XXXA Exposure to other specified factors, initial encounter: Secondary | ICD-10-CM | POA: Insufficient documentation

## 2018-07-25 DIAGNOSIS — Z89511 Acquired absence of right leg below knee: Secondary | ICD-10-CM | POA: Diagnosis not present

## 2018-07-25 DIAGNOSIS — I69351 Hemiplegia and hemiparesis following cerebral infarction affecting right dominant side: Secondary | ICD-10-CM | POA: Diagnosis not present

## 2018-07-25 DIAGNOSIS — I77810 Thoracic aortic ectasia: Secondary | ICD-10-CM | POA: Diagnosis not present

## 2018-07-25 DIAGNOSIS — M79661 Pain in right lower leg: Secondary | ICD-10-CM | POA: Diagnosis not present

## 2018-07-25 DIAGNOSIS — E1152 Type 2 diabetes mellitus with diabetic peripheral angiopathy with gangrene: Secondary | ICD-10-CM | POA: Diagnosis not present

## 2018-07-25 DIAGNOSIS — H5461 Unqualified visual loss, right eye, normal vision left eye: Secondary | ICD-10-CM | POA: Diagnosis not present

## 2018-07-25 DIAGNOSIS — I480 Paroxysmal atrial fibrillation: Secondary | ICD-10-CM | POA: Diagnosis not present

## 2018-07-25 DIAGNOSIS — Z4781 Encounter for orthopedic aftercare following surgical amputation: Secondary | ICD-10-CM | POA: Diagnosis not present

## 2018-07-25 DIAGNOSIS — I10 Essential (primary) hypertension: Secondary | ICD-10-CM | POA: Diagnosis not present

## 2018-07-25 DIAGNOSIS — D649 Anemia, unspecified: Secondary | ICD-10-CM | POA: Diagnosis not present

## 2018-07-25 DIAGNOSIS — R911 Solitary pulmonary nodule: Secondary | ICD-10-CM | POA: Diagnosis not present

## 2018-07-25 LAB — POCT I-STAT CREATININE: Creatinine, Ser: 0.6 mg/dL — ABNORMAL LOW (ref 0.61–1.24)

## 2018-07-25 IMAGING — MR MR [PERSON_NAME] LOW WO/W CM*R*
7 of 9 series · 32 of 40 positions shown · IV contrast (gadavist)
Comparison: None.

CLINICAL DATA: Pain with continued drainage at wound following
below the knee amputation 06/02/2018. Suspected osteomyelitis.

EXAM:
MRI OF LOWER RIGHT EXTREMITY WITHOUT AND WITH CONTRAST
TECHNIQUE: Multiplanar, multisequence MR imaging of the distal right thigh and
proximal lower leg was performed both before and after
administration of intravenous contrast.
CONTRAST:  8.5 cc Gadavist

[Series 4: [id number] · coronal · right · 4.0mm · 0.61mm/px · 4 of 49 slices shown]
[im 1/49]
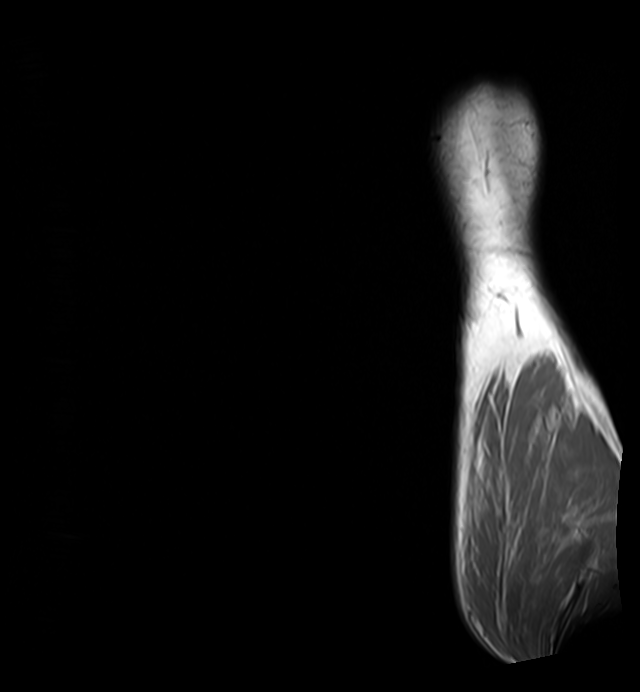
[im 17/49]
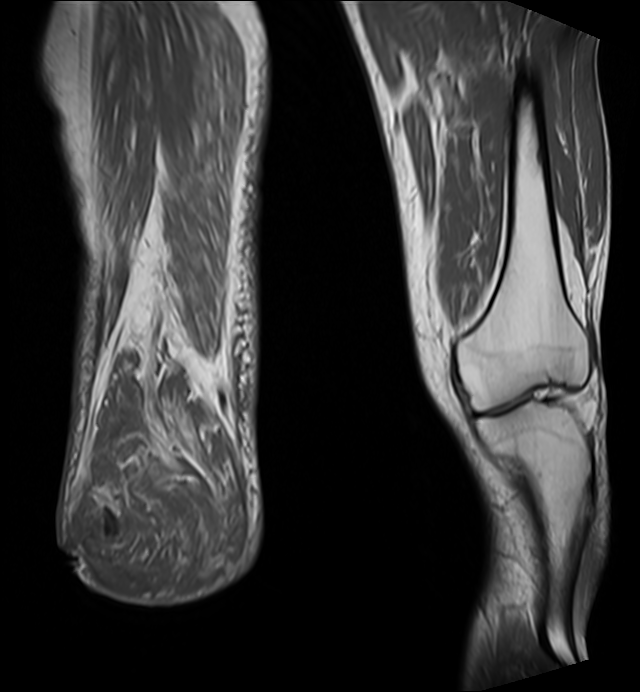
[im 33/49]
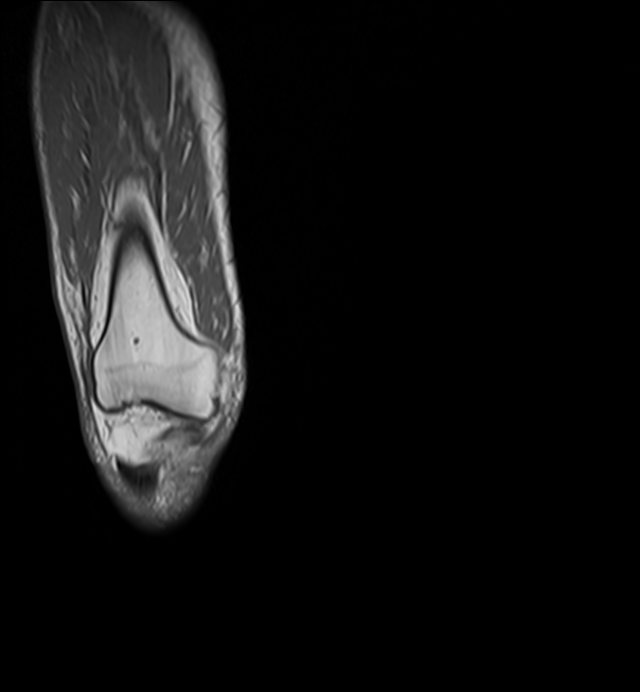
[im 49/49]
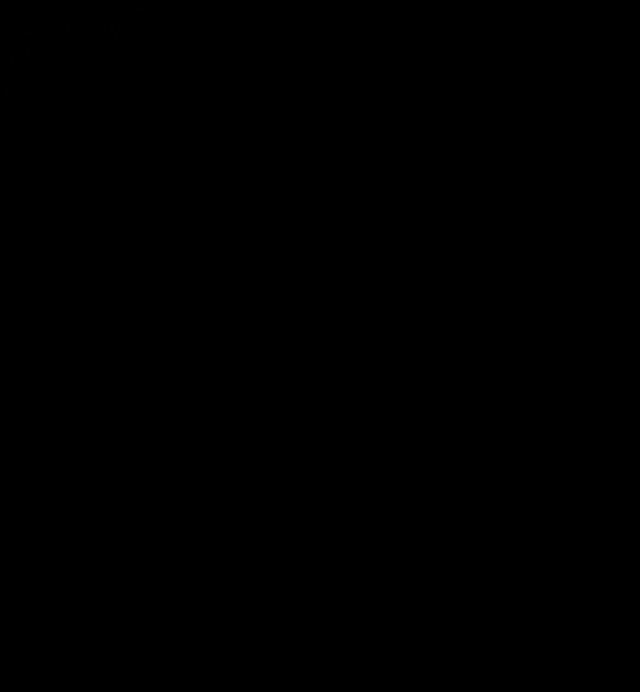

[Series 6: T2 fat-sat · axial · right · 5.0mm · 0.70mm/px · z∈[-173,+95]mm · 5 of 44 slices shown]
[im 1/44]
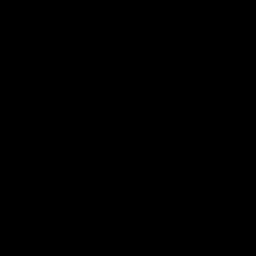
[im 11/44]
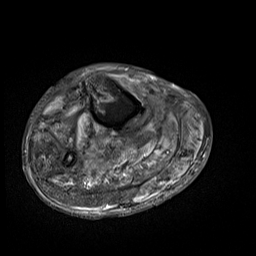
[im 22/44]
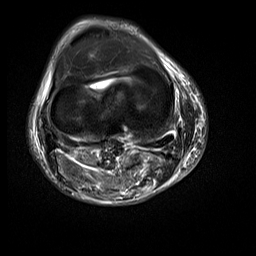
[im 33/44]
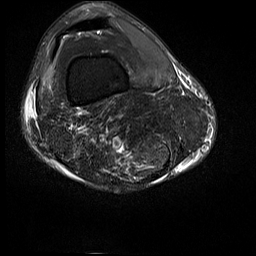
[im 44/44]
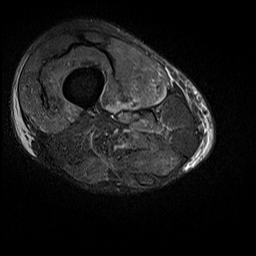

[Series 7: T1 fat-sat · axial · non-contrast · right · 5.0mm · 0.70mm/px · z∈[-173,+95]mm · 5 of 44 slices shown]
[im 1/44]
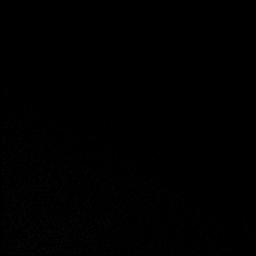
[im 11/44]
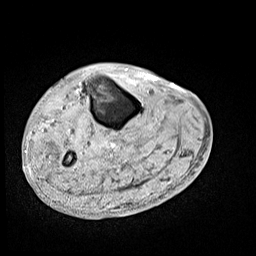
[im 22/44]
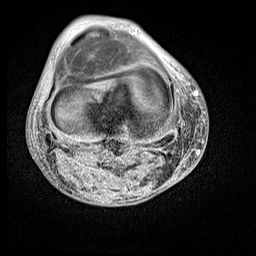
[im 33/44]
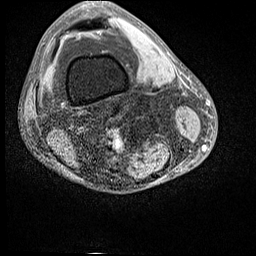
[im 44/44]
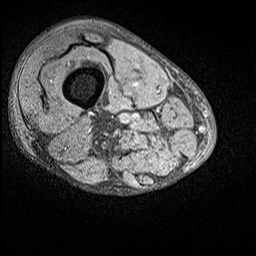

[Series 8: T1 · axial · right · 5.0mm · 0.70mm/px · z∈[-173,+95]mm · 5 of 44 slices shown]
[im 1/44]
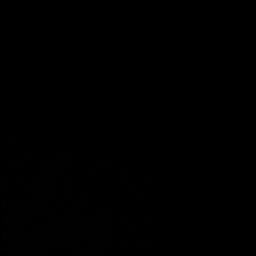
[im 11/44]
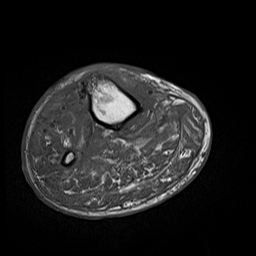
[im 22/44]
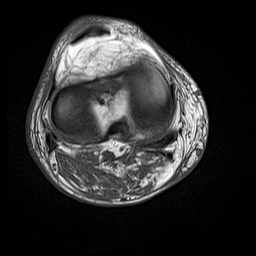
[im 33/44]
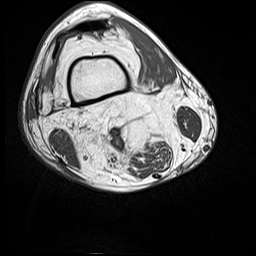
[im 44/44]
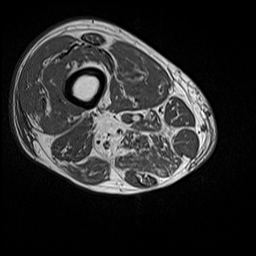

[Series 10: T1 fat-sat post-contrast · axial · right · 5.0mm · 0.70mm/px · z∈[-173,+95]mm · 5 of 44 slices shown (1 of 3)]
[im 1/44]
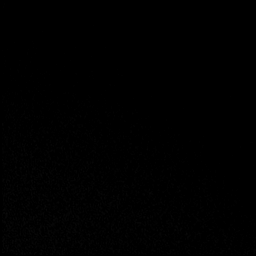
[im 11/44]
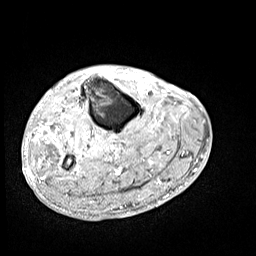
[im 22/44]
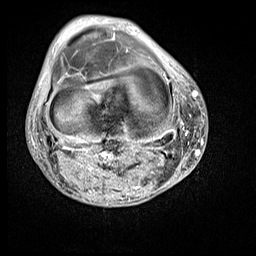
[im 33/44]
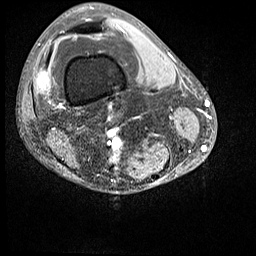
[im 44/44]
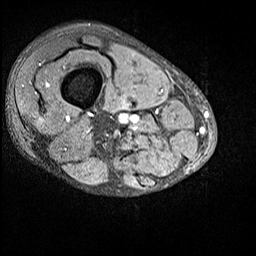

[Series 11: T1 fat-sat post-contrast · sagittal · right · 5.0mm · 0.94mm/px · 3 of 30 slices shown (2 of 3)]
[im 1/30]
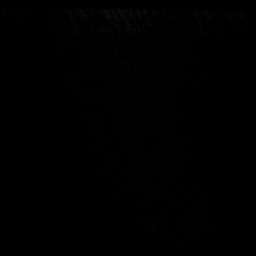
[im 15/30]
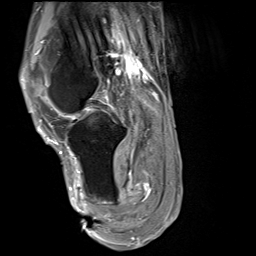
[im 30/30]
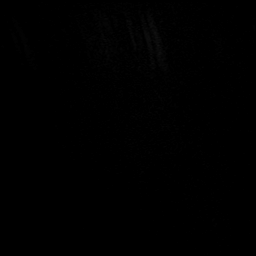

[Series 12: T1 fat-sat post-contrast · coronal · right · 4.0mm · 0.76mm/px · 5 of 49 slices shown (3 of 3)]
[im 1/49]
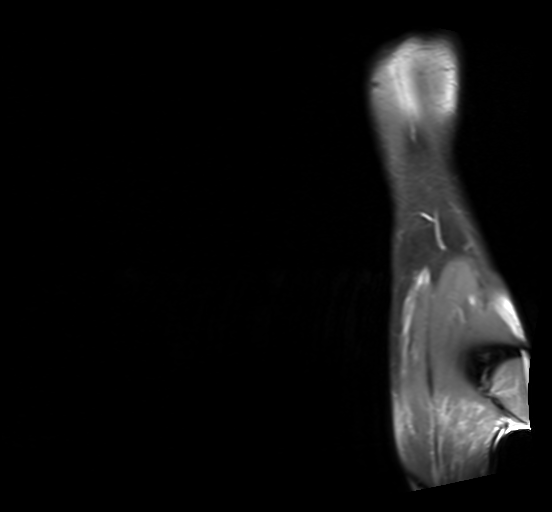
[im 13/49]
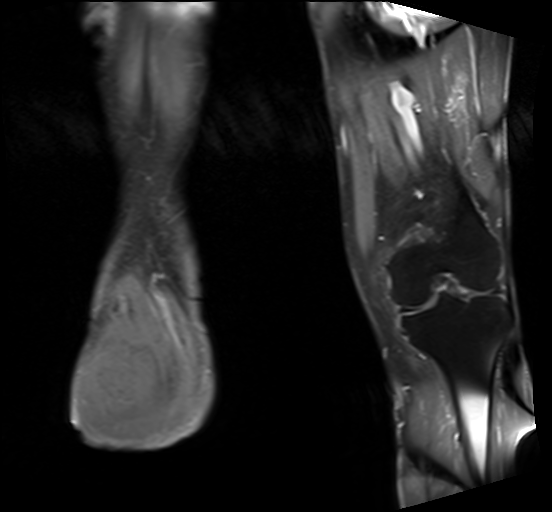
[im 25/49]
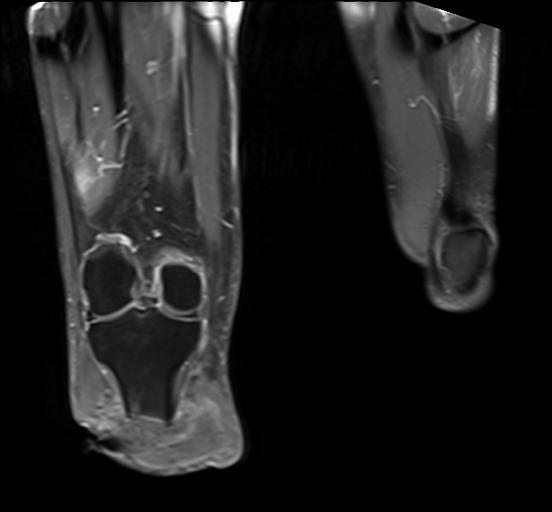
[im 37/49]
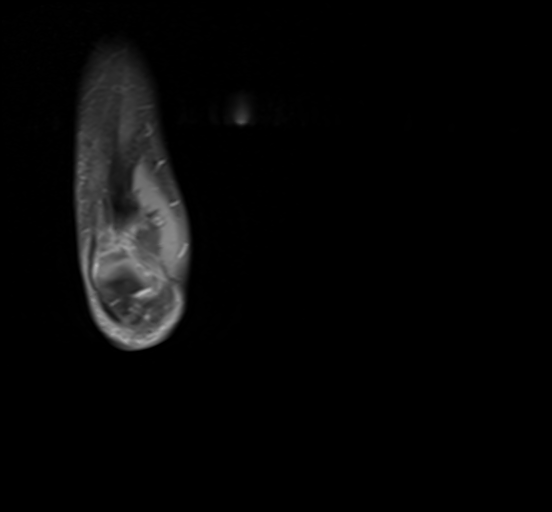
[im 49/49]
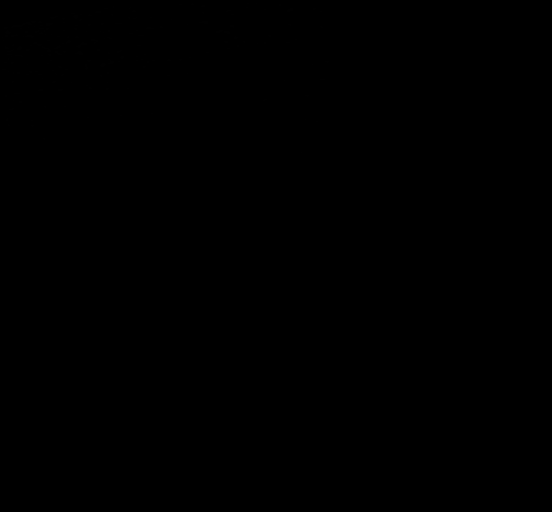

[32 of 40 positions shown; findings below may reference images not displayed]

FINDINGS: Bones/Joint/Cartilage

Status post below the knee amputation. There is low-level marrow T2
hyperintensity and enhancement anteriorly in the distal tibial
stump. There is no cortical destruction. The distal fibula appears
intact. The patella and distal femur appear normal. No significant
knee joint effusion.

Ligaments

The cruciate and collateral ligaments at the right knee are intact.

Muscles and Tendons

Mild nonspecific T2 hyperintensity throughout the distal quadriceps
and proximal lower leg musculature, likely subacute denervation. No
focal intramuscular fluid collections are seen.

Soft tissues

Below the knee amputation. There is irregularity of the soft tissues
along the anterior aspect of the amputation stump with probable skin
ulceration and underlying soft tissue inflammation. There are
multiple small foci of susceptibility artifact in this area which
could be postsurgical or reflect air. There are no drainable fluid
collections.
IMPRESSION: 1. Inflammatory changes within the anterior soft tissues of the
remaining lower leg status post below the knee amputation. Given the
reported continued drainage, this may reflect a sinus tract. No
drainable abscess identified.
2. Adjacent to the soft tissue changes, there is marrow T2
hyperintensity and low level enhancement of the tibial stump
anteriorly which could reflect osteomyelitis. No evidence of
intra-articular infection.
3. Probable subacute denervation changes in the musculature
surrounding the right knee.

## 2018-07-25 MED ORDER — GADOBUTROL 1 MMOL/ML IV SOLN
8.5000 mL | Freq: Once | INTRAVENOUS | Status: AC | PRN
  Administered 2018-07-25: 8.5 mL via INTRAVENOUS

## 2018-07-25 NOTE — Telephone Encounter (Signed)
It looks like Dr. Consuela Mimes has taken over some pain medicine.

## 2018-07-29 ENCOUNTER — Other Ambulatory Visit (INDEPENDENT_AMBULATORY_CARE_PROVIDER_SITE_OTHER): Payer: Self-pay | Admitting: Nurse Practitioner

## 2018-07-29 ENCOUNTER — Encounter (INDEPENDENT_AMBULATORY_CARE_PROVIDER_SITE_OTHER): Payer: Self-pay

## 2018-07-29 ENCOUNTER — Other Ambulatory Visit: Payer: Self-pay | Admitting: Family Medicine

## 2018-07-29 ENCOUNTER — Telehealth (INDEPENDENT_AMBULATORY_CARE_PROVIDER_SITE_OTHER): Payer: Self-pay

## 2018-07-29 DIAGNOSIS — R918 Other nonspecific abnormal finding of lung field: Secondary | ICD-10-CM

## 2018-07-29 DIAGNOSIS — T879 Unspecified complications of amputation stump: Secondary | ICD-10-CM

## 2018-07-29 MED ORDER — CIPROFLOXACIN HCL 750 MG PO TABS: 750.0000 mg | ORAL_TABLET | Freq: Two times a day (BID) | ORAL | 1 refills | Status: DC

## 2018-07-29 NOTE — Telephone Encounter (Signed)
We don't need to have him come in today, but we will call in a different antibiotic to his pharmacy.His MRI showed possible osteomyelitis.  We will plan for surgical debridement with Dr. Lucky Cowboy sometime next week, we will also have a wound VAC attached to help with healing and drainage.  We will attempt this as an outpatient procedure however if there are complications, an overnight stay may be necessary.This weekend, if he should start her running a fever or if his wound should come completely open he should go into the emergency room for evaluation.  Please let us know if there are any other questions.

## 2018-08-01 ENCOUNTER — Encounter
Admission: RE | Admit: 2018-08-01 | Discharge: 2018-08-01 | Disposition: A | Discharge status: Home / Self Care | Payer: Medicare HMO | Source: Ambulatory Visit | Attending: Vascular Surgery | Admitting: Vascular Surgery

## 2018-08-01 ENCOUNTER — Other Ambulatory Visit: Payer: Self-pay

## 2018-08-01 ENCOUNTER — Telehealth: Payer: Self-pay | Admitting: Cardiovascular Disease

## 2018-08-01 DIAGNOSIS — Z01812 Encounter for preprocedural laboratory examination: Secondary | ICD-10-CM | POA: Insufficient documentation

## 2018-08-01 HISTORY — DX: Gastro-esophageal reflux disease without esophagitis: K21.9

## 2018-08-01 LAB — CBC WITH DIFFERENTIAL/PLATELET
Abs Immature Granulocytes: 0.02 10*3/uL (ref 0.00–0.07)
BASOS PCT: 1 %
Basophils Absolute: 0 10*3/uL (ref 0.0–0.1)
Eosinophils Absolute: 0.4 10*3/uL (ref 0.0–0.5)
Eosinophils Relative: 5 %
HCT: 38.9 % — ABNORMAL LOW (ref 39.0–52.0)
Hemoglobin: 12 g/dL — ABNORMAL LOW (ref 13.0–17.0)
Immature Granulocytes: 0 %
Lymphocytes Relative: 19 %
Lymphs Abs: 1.5 10*3/uL (ref 0.7–4.0)
MCH: 25.2 pg — ABNORMAL LOW (ref 26.0–34.0)
MCHC: 30.8 g/dL (ref 30.0–36.0)
MCV: 81.7 fL (ref 80.0–100.0)
MONOS PCT: 7 %
Monocytes Absolute: 0.6 10*3/uL (ref 0.1–1.0)
Neutro Abs: 5.5 10*3/uL (ref 1.7–7.7)
Neutrophils Relative %: 68 %
PLATELETS: 231 10*3/uL (ref 150–400)
RBC: 4.76 MIL/uL (ref 4.22–5.81)
RDW: 17.2 % — ABNORMAL HIGH (ref 11.5–15.5)
WBC: 8 10*3/uL (ref 4.0–10.5)
nRBC: 0 % (ref 0.0–0.2)

## 2018-08-01 LAB — BASIC METABOLIC PANEL
Anion gap: 10 (ref 5–15)
BUN: 22 mg/dL (ref 8–23)
CO2: 26 mmol/L (ref 22–32)
Calcium: 9.7 mg/dL (ref 8.9–10.3)
Chloride: 101 mmol/L (ref 98–111)
Creatinine, Ser: 0.67 mg/dL (ref 0.61–1.24)
GFR calc Af Amer: >60 mL/min (ref >60)
GFR calc non Af Amer: >60 mL/min (ref >60)
GLUCOSE: 168 mg/dL — AB (ref 70–99)
Potassium: 3.6 mmol/L (ref 3.5–5.1)
Sodium: 137 mmol/L (ref 135–145)

## 2018-08-01 LAB — APTT: aPTT: 70 seconds — ABNORMAL HIGH (ref 24–36)

## 2018-08-01 LAB — TYPE AND SCREEN
ABO/RH(D): B POS
Antibody Screen: NEGATIVE

## 2018-08-01 LAB — PROTIME-INR
INR: 1.3
Prothrombin Time: 16.1 seconds — ABNORMAL HIGH (ref 11.4–15.2)

## 2018-08-01 NOTE — Pre-Procedure Instructions (Signed)
CARDIAC CLEARANCE FROM 10/19  ON CHART

## 2018-08-01 NOTE — Telephone Encounter (Signed)
° °  [facility] Pre-operative Risk Assessment    Request for surgical clearance:  1. What type of surgery is being performed? Debridement   2. When is this surgery scheduled? 1/2   3. What type of clearance is required (medical clearance vs. Pharmacy clearance to hold med vs. Both)? Pharmacy   4. Are there any medications that need to be held prior to surgery and how long? Please advise wife feels like it needs to be greater than 3 days   5. Practice name and name of physician performing surgery? [facility] Dr. Lucky Cowboy   6. What is your office phone number patient wife calling    7.   What is your office fax number patient wife calling   8.   Anesthesia type (None, local, MAC, general) ? Unknown      Clarisse Gouge 08/01/2018, 9:58 AM  _________________________________________________________________   (provider comments below)

## 2018-08-01 NOTE — Patient Instructions (Signed)
  Your procedure is scheduled on: Thursday August 04, 2018 Report to Same Day Surgery 2nd floor [facility] Entrance-take elevator on left to 2nd floor.  Check in with surgery information desk.) To find out your arrival time, call [phone] 1:00-3:00 PM on Tuesday August 02, 2018  Remember: Instructions that are not followed completely may result in serious medical risk, up to and including death, or upon the discretion of your surgeon and anesthesiologist your surgery may need to be rescheduled.    __x__ 1. Do not eat food (including mints, candies, chewing gum) after midnight the night before your procedure. You may drink water up to 2 hours before you are scheduled to arrive at the hospital for your procedure.  Do not drink anything within 2 hours of your scheduled arrival to the hospital.   __x__ 2. No Alcohol for 24 hours before or after surgery.   __x__ 3. No Smoking or e-cigarettes for 24 hours before surgery.  Do not use any chewable tobacco products for at least 6 hours before surgery.   __x__ 4. Notify your doctor if there is any change in your medical condition (cold, fever, infections).   __x__ 5. On the morning of surgery brush your teeth with toothpaste and water.  You may rinse your mouth with mouthwash if you wish.  Do not swallow any toothpaste or mouthwash.  Please read over the following fact sheets that you were given:   [facility] Preparing for Surgery and/or MRSA Information    __x__ Use CHG Soap or Sage wipes as directed on instruction sheet.   Do not wear jewelry on the day of surgery.  Do not use any lotions, powders, deodorant, or perfumes.   Do not shave below the face/neck 48 hours prior to surgery.   Do not bring valuables to the hospital.    [facility] is not responsible for any belongings or valuables.               Contacts, dentures or bridgework may not be worn into surgery.  Leave your suitcase in the car. After surgery it may be  brought to your room.  For patients admitted to the hospital, discharge time is determined by your treatment team.  For patients discharged on the day of surgery, you will NOT be permitted to drive yourself home.  You must have a responsible adult with you for 24 hours after surgery.  __x__ Take these medications on the morning of surgery with a SMALL SIP OF WATER:  1. Gabapentin (Neurontin)  2. Lansoprazole (Prevacid)  3. Metoprolol (Lopressor)  4. Oxycodone as needed for pain  __x__ Stop Metformin and/or Janumet 2 days before surgery (Last dose today 08/01/2018).    __x__ Follow recommendations from Cardiologist, Pulmonologist or PCP regarding stopping Aspirin, Coumadin, Plavix, Eliquis, Effient, Pradaxa, and Pletal.  Last dose tomorrow 08/02/2018  __x__ Avoid Anti-inflammatories such as Advil, Ibuprofen, Motrin, Aleve, Naproxen, Naprosyn, BC/Goodies powders or aspirin products. You may continue to take Tylenol and Celebrex.   __x__ Stop supplements (Vitamin C) until after surgery. You may continue to take Vitamin D, Vitamin B, and multivitamin.

## 2018-08-02 NOTE — Pre-Procedure Instructions (Signed)
PT & PTT results sent to Anesthesia for review.  Questioning if need to check day of surgery?

## 2018-08-03 NOTE — Telephone Encounter (Signed)
Acceptable risk to stop Eliquis 2 days prior to procedure Would stay on low-dose aspirin 81 daily Restart Eliquis when approved by Dr. dew

## 2018-08-04 MED ORDER — CEFAZOLIN SODIUM-DEXTROSE 2-4 GM/100ML-% IV SOLN: 2.0000 g | INTRAVENOUS | Status: AC

## 2018-08-05 ENCOUNTER — Ambulatory Visit: Payer: Medicare HMO

## 2018-08-05 ENCOUNTER — Telehealth (INDEPENDENT_AMBULATORY_CARE_PROVIDER_SITE_OTHER): Payer: Self-pay

## 2018-08-05 DIAGNOSIS — D649 Anemia, unspecified: Secondary | ICD-10-CM | POA: Diagnosis not present

## 2018-08-05 DIAGNOSIS — Z4781 Encounter for orthopedic aftercare following surgical amputation: Secondary | ICD-10-CM | POA: Diagnosis not present

## 2018-08-05 DIAGNOSIS — H5461 Unqualified visual loss, right eye, normal vision left eye: Secondary | ICD-10-CM | POA: Diagnosis not present

## 2018-08-05 DIAGNOSIS — I69351 Hemiplegia and hemiparesis following cerebral infarction affecting right dominant side: Secondary | ICD-10-CM | POA: Diagnosis not present

## 2018-08-05 DIAGNOSIS — E1152 Type 2 diabetes mellitus with diabetic peripheral angiopathy with gangrene: Secondary | ICD-10-CM | POA: Diagnosis not present

## 2018-08-05 DIAGNOSIS — I480 Paroxysmal atrial fibrillation: Secondary | ICD-10-CM | POA: Diagnosis not present

## 2018-08-05 DIAGNOSIS — E785 Hyperlipidemia, unspecified: Secondary | ICD-10-CM | POA: Diagnosis not present

## 2018-08-05 DIAGNOSIS — R911 Solitary pulmonary nodule: Secondary | ICD-10-CM | POA: Diagnosis not present

## 2018-08-05 DIAGNOSIS — I77810 Thoracic aortic ectasia: Secondary | ICD-10-CM | POA: Diagnosis not present

## 2018-08-05 DIAGNOSIS — Z89511 Acquired absence of right leg below knee: Secondary | ICD-10-CM | POA: Diagnosis not present

## 2018-08-05 DIAGNOSIS — I10 Essential (primary) hypertension: Secondary | ICD-10-CM | POA: Diagnosis not present

## 2018-08-05 NOTE — Telephone Encounter (Signed)
Clearance routed through [employer]

## 2018-08-05 NOTE — Telephone Encounter (Signed)
Called the patient and spoke with his wife about having him rescheduled to have his surgery with Dr. Lucky Cowboy. Patient has been rescheduled for Monday 08/08/2018 for his debridement. The rep for [employer] stated the wound vac will be delivered to the patient's home on Saturday, the patient is aware of this and his arrival time to same day surgery is 6:30 am. NPO and to follow instructions from pre-admit regarding his medications.

## 2018-08-06 DIAGNOSIS — T8189XA Other complications of procedures, not elsewhere classified, initial encounter: Secondary | ICD-10-CM | POA: Diagnosis not present

## 2018-08-07 NOTE — Progress Notes (Signed)
Patient's Name: [name].  MRN: [id number]  Referring Provider: [name], DO  DOB: May 03, 1956  PCP: [name], DO  DOS: 08/10/2018  Note by: Gaspar Cola, MD  Service setting: Ambulatory outpatient  Specialty: Interventional Pain Management  Location: [facility] (AMB) Pain Management Facility    Patient type: Established   Primary Reason(s) for Visit: Encounter for prescription drug management. (Level of risk: moderate)  CC: Leg Pain (right)  HPI  [name] is a 63 y.o. year old, male patient, who comes today for a medication management evaluation. 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 His primarily concern today is the Leg Pain (right)  Pain Assessment: Location: Right Leg(stump) Radiating: denies Onset: More than a month ago Duration: Chronic pain Quality: Sharp Severity: 7 /10 (subjective, self-reported pain score)  Note: Reported level is inconsistent with clinical observations. Clinically the patient looks like a 2/10 A 2/10 is viewed as "Mild to Moderate" and described as noticeable and distracting. Impossible to hide from other people. More frequent flare-ups. Still possible to adapt and function close to normal. It can be very annoying and may have occasional  stronger flare-ups. With discipline, patients may get used to it and adapt. Information on the proper use of the pain scale provided to the patient today. When using our objective Pain Scale, levels between 6 and 10/10 are said to belong in an emergency room, as it progressively worsens from a 6/10, described as severely limiting, requiring emergency care not usually available at an outpatient pain management facility. At a 6/10 level, communication becomes difficult and requires great effort. Assistance to reach the emergency department may be required. Facial flushing and profuse sweating along with potentially dangerous increases in heart rate and blood pressure will be evident. Effect on ADL: Limits activities Timing: Constant Modifying factors: nothing BP: 105/82  HR: 66  [name] was last scheduled for an appointment on 07/20/2018 for medication management. During today's appointment we reviewed [name]'s chronic pain status, as well as his outpatient medication regimen.  Today we went over the results of his recent MRI of the tibia.  The MRI does have some findings suggestive of the possibility of osteomyelitis.  The patient symptoms seem to be improving as his stump is healing, which confirms that this is due to subacute pain after the surgery and due to his healing process.  As we had indicated before, we will not be taking over his medications at this point.  We have informed the patient that should he continue to experience pain once he has completely healed, then he should give [place] a call to reevaluate the situation at that point.  For the time being, he will continue to get his medications from his treating physician.  The patient  reports no history of drug use. His body mass index is 25.83 kg/m.  Further details on both, my assessment(s), as well as the proposed treatment plan, please see  below.  Controlled Substance Pharmacotherapy Assessment REMS (Risk Evaluation and Mitigation  Strategy)  Analgesic: Oxycodone IR 5 mg 1 tablet p.o. every 6 hours PRN for pain.  (20 mg/day of oxycodone) (limited prescription provided to the patient today with no intention of long-term medication management.) Highest recorded MME/day:47m/day MME/day:325mday TiDewayne ShorterRN  08/10/2018 10:24 AM  Signed Nursing Pain Medication Assessment:  Safety precautions to be maintained throughout the outpatient stay will include: orient to surroundings, keep bed in low position, maintain call bell within reach at all times, provide assistance with transfer out of bed and ambulation.  Medication Inspection Compliance: Pill count conducted under aseptic conditions, in front of the patient. Neither the pills nor the bottle was removed from the patient's sight at any time. Once count was completed pills were immediately returned to the patient in their original bottle.  Medication: Oxycodone IR Pill/Patch Count: 20 of 28 pills remain Pill/Patch Appearance: Markings consistent with prescribed medication Bottle Appearance: Standard pharmacy container. Clearly labeled. Filled Date: 01 / 06 / 2019 Last Medication intake:  Today  Given by Dr DeLucky Cowboyost surgery for right stump debridement   Pharmacokinetics: Liberation and absorption (onset of action): WNL Distribution (time to peak effect): WNL Metabolism and excretion (duration of action): WNL         Pharmacodynamics: Desired effects: Analgesia: [name] >50% benefit. Functional ability: Patient reports that medication allows him to accomplish basic ADLs Clinically meaningful improvement in function (CMIF): Sustained CMIF goals met Perceived effectiveness: Described as relatively effective, allowing for increase in activities of daily living (ADL) Undesirable effects: Side-effects or Adverse reactions: None reported Monitoring: [place] PMP: Online review of the past 1215-monthriod conducted. Compliant with practice rules and  regulations Last UDS on record: Summary  Date Value Ref Range Status  07/11/2018 FINAL  Final    Comment:    ==================================================================== TOXASSURE COMP DRUG ANALYSIS,UR ==================================================================== Test                             Result       Flag       Units Drug Present and Declared for Prescription Verification   Gabapentin                     PRESENT      EXPECTED   Acetaminophen                  PRESENT      EXPECTED   Metoprolol                     PRESENT      EXPECTED Drug Absent but Declared for Prescription Verification   Salicylate                     Not Detected UNEXPECTED    Aspirin, as indicated in the declared medication list, is not    always detected even when used as directed. ==================================================================== Test                      Result    Flag   Units      Ref Range   Creatinine              36               mg/dL      >=20 ==================================================================== Declared Medications:  The flagging and interpretation on this report are based on the  following declared medications.  Unexpected results may arise from  inaccuracies in the declared medications.  **Note: The testing scope of this panel includes these medications:  Gabapentin (Neurontin)  Metoprolol (Lopressor)  **Note: The testing scope of this panel does not include small to  moderate amounts of these reported medications:  Acetaminophen (Tylenol)  Aspirin (Aspirin 81)  **Note: The testing scope of this panel does not include following  reported medications:  Apixaban (Eliquis)  Ascorbic Acid  Doxycycline (Vibramycin)  Empagliflozin (Jardiance)  Ezetimibe (Vytorin)  Hydrochlorothiazide (Microzide)  Iron (Ferrous Sulfate)  Metformin (Glucophage)  Multivitamin (MVI)  Simvastatin (Vytorin)  Topical  Vitamin B  (Biotin) ==================================================================== For clinical consultation, please call [phone]. ====================================================================    UDS interpretation: Compliant          Medication Assessment Form: Reviewed. Patient indicates being compliant with therapy Treatment compliance: Compliant Risk Assessment Profile: Aberrant behavior: See prior evaluations. None observed or detected today Comorbid factors increasing risk of overdose: See prior notes. No additional risks detected today Opioid risk tool (ORT) (Total Score): 3 Personal History of Substance Abuse (SUD-Substance use disorder):  Alcohol: Positive Male or Male  Illegal Drugs: Negative  Rx Drugs:    ORT Risk Level calculation: Low Risk Risk of substance use disorder (SUD): Low Opioid Risk Tool - 08/10/18 1020      Family History of Substance Abuse   Alcohol  Negative    Illegal Drugs  Negative    Rx Drugs  Negative      Personal History of Substance Abuse   Alcohol  Positive Male or Male    Illegal Drugs  Negative      Age   Age between 37-45 years   No      History of Preadolescent Sexual Abuse   History of Preadolescent Sexual Abuse  Negative or Male      Psychological Disease   Psychological Disease  Negative    Depression  Negative      Total Score   Opioid Risk Tool Scoring  3    Opioid Risk Interpretation  Low Risk      ORT Scoring interpretation table:  Score <3 = Low Risk for SUD  Score between 4-7 = Moderate Risk for SUD  Score >8 = High Risk for Opioid Abuse   Risk Mitigation Strategies:  Patient Counseling: Covered Patient-Prescriber Agreement (PPA): Present and active  Notification to other healthcare providers: Done  Pharmacologic Plan: No change in therapy, at this time.             Laboratory Chemistry  Inflammation Markers (CRP: Acute Phase) (ESR: Chronic Phase) Lab Results  Component Value Date   CRP 8  07/11/2018   ESRSEDRATE 81 (H) 07/11/2018                         Rheumatology Markers No results found.  Renal Function Markers Lab Results  Component Value Date   BUN 22 08/01/2018   CREATININE 0.67 08/01/2018   BCR 16 05/06/2018   GFRAA >60 08/01/2018   GFRNONAA >60 08/01/2018                             Hepatic Function Markers Lab Results  Component Value Date   AST 15 05/06/2018   ALT 13 05/06/2018   ALBUMIN 4.2 05/06/2018   ALKPHOS 82 05/06/2018  Electrolytes Lab Results  Component Value Date   NA 137 08/01/2018   K 3.6 08/01/2018   CL 101 08/01/2018   CALCIUM 9.7 08/01/2018   MG 1.9 07/11/2018                        Neuropathy Markers Lab Results  Component Value Date   VITAMINB12 529 07/11/2018   FOLATE 16.3 04/13/2018   HGBA1C 7.2 (H) 05/06/2018   HIV Non Reactive 06/22/2017                        CNS Tests No results found.  Bone Pathology Markers Lab Results  Component Value Date   25OHVITD1 26 (L) 07/11/2018   25OHVITD2 <1.0 07/11/2018   25OHVITD3 26 07/11/2018                         Coagulation Parameters Lab Results  Component Value Date   INR 1.30 08/01/2018   LABPROT 16.1 (H) 08/01/2018   APTT 70 (H) 08/01/2018   PLT 231 08/01/2018   LABHEMA WILL FOLLOW 11/02/2016                        Cardiovascular Markers Lab Results  Component Value Date   TROPONINI <0.03 06/22/2017   HGB 12.0 (L) 08/01/2018   HCT 38.9 (L) 08/01/2018                         CA Markers No results found.  Note: Lab results reviewed.  Recent Diagnostic Imaging Review  Foot Imaging: Foot-R DG Complete:  Results for orders placed during the hospital encounter of 03/03/18  DG Foot Complete Right   Narrative CLINICAL DATA:  Pain and discoloration in the right toes.  EXAM: RIGHT FOOT COMPLETE - 3+ VIEW  COMPARISON:  None.  FINDINGS: There is no evidence of fracture or dislocation. There is no evidence of arthropathy or  other focal bone abnormality. Dorsal soft tissue swelling of the midfoot.  IMPRESSION: No evidence of acute osseus abnormality.  Dorsal soft tissue swelling of the midfoot.   Electronically Signed   By: Fidela Salisbury M.D.   On: 03/03/2018 16:07    Complexity Note: Imaging results reviewed. Results shared with [name], using Layman's terms.                         Meds   Current Outpatient Medications:  .  acetaminophen (TYLENOL) 500 MG tablet, Take 1,000 mg by mouth every 6 (six) hours as needed (for pain.)., Disp: , Rfl:  .  apixaban (ELIQUIS) 5 MG TABS tablet, Take 1 tablet (5 mg total) by mouth 2 (two) times daily., Disp: 180 tablet, Rfl: 1 .  aspirin 81 MG EC tablet, Take 1 tablet (81 mg total) by mouth daily., Disp: 90 tablet, Rfl: 4 .  Biotin (BIOTIN 5000) 5 MG CAPS, Take 1 capsule by mouth daily., Disp: , Rfl:  .  Calcium Carb-Cholecalciferol (CALCIUM 600-D PO), Take by mouth daily., Disp: , Rfl:  .  cholecalciferol (VITAMIN D3) 25 MCG (1000 UT) tablet, Take 1,000 Units by mouth daily., Disp: , Rfl:  .  ciprofloxacin (CIPRO) 750 MG tablet, Take 1 tablet (750 mg total) by mouth 2 (two) times daily., Disp: 60 tablet, Rfl: 1 .  donepezil (ARICEPT) 5 MG tablet, Take 5 mg by mouth  at bedtime., Disp: , Rfl:  .  empagliflozin (JARDIANCE) 10 MG TABS tablet, Take 5 mg by mouth daily., Disp: 90 tablet, Rfl: 1 .  ezetimibe-simvastatin (VYTORIN) 10-40 MG tablet, Take 1 tablet by mouth daily at 6 PM., Disp: 90 tablet, Rfl: 3 .  ferrous sulfate (FERROUSUL) 325 (65 FE) MG tablet, Take 1 tablet (325 mg total) by mouth daily with breakfast., Disp: 90 tablet, Rfl: 3 .  gabapentin (NEURONTIN) 800 MG tablet, Take 1 tablet (800 mg total) by mouth 3 (three) times daily., Disp: 270 tablet, Rfl: 1 .  lansoprazole (PREVACID) 30 MG capsule, Take 30 mg by mouth daily at 12 noon., Disp: , Rfl:  .  metFORMIN (GLUCOPHAGE) 1000 MG tablet, Take 1 tablet (1,000 mg total) by mouth 2 (two) times daily  with a meal., Disp: 180 tablet, Rfl: 3 .  metoprolol tartrate (LOPRESSOR) 25 MG tablet, Take 1 tablet (25 mg total) by mouth 2 (two) times daily., Disp: 180 tablet, Rfl: 3 .  Multiple Vitamin (MULTIVITAMIN WITH MINERALS) TABS tablet, Take 1 tablet by mouth daily., Disp: 30 tablet, Rfl: 0 .  oxyCODONE (OXY IR/ROXICODONE) 5 MG immediate release tablet, Take 1 tablet (5 mg total) by mouth every 6 (six) hours as needed for up to 7 days for severe pain. Must last 7 days., Disp: 28 tablet, Rfl: 0 .  vitamin B-12 (CYANOCOBALAMIN) 1000 MCG tablet, Take 1,000 mcg by mouth daily., Disp: , Rfl:  .  vitamin C (ASCORBIC ACID) 500 MG tablet, Take 500 mg by mouth daily., Disp: , Rfl:  .  hydrochlorothiazide (MICROZIDE) 12.5 MG capsule, Take 1 capsule (12.5 mg total) by mouth daily., Disp: 90 capsule, Rfl: 3  ROS  Constitutional: Denies any fever or chills Gastrointestinal: No reported hemesis, hematochezia, vomiting, or acute GI distress Musculoskeletal: Denies any acute onset joint swelling, redness, loss of ROM, or weakness Neurological: No reported episodes of acute onset apraxia, aphasia, dysarthria, agnosia, amnesia, paralysis, loss of coordination, or loss of consciousness  Allergies  [name] is allergic to sodium pentobarbital [pentobarbital] and lipitor [atorvastatin].  PFSH  Drug: [name]  reports no history of drug use. Alcohol:  reports previous alcohol use of about 8.0 standard drinks of alcohol per week. Tobacco:  reports that he quit smoking about 7 months ago. His smoking use included cigarettes. He has a 30.00 pack-year smoking history. He has never used smokeless tobacco. Medical:  has a past medical history of Acute left PCA stroke ([place]) (02/17/2015), Anemia, Atherosclerosis of native arteries of the extremities with gangrene ([place]) (05/08/2018), Atrial fibrillation ([place]), Blind, Diabetes mellitus with complication ([place]), Dilated aortic root ([place]), Dysrhythmia, Gangrene of right foot  ([place]) (06/02/2018), GERD (gastroesophageal reflux disease), History of echocardiogram, History of hernia repair, History of stress test, Hyperlipidemia, Hypertension, Leg pain, Legally blind, Necrotic toes ([place]) (02/21/2018), PAD (peripheral artery disease) ([place]), PAF (paroxysmal atrial fibrillation) ([place]), Peripheral vascular disease ([place]), Pulmonary nodules, Stroke ([place]), and Stroke ([place]) (07/11/2018). Surgical: [name]  has a past surgical history that includes Appendectomy; Lower Extremity Angiography (Right, 10/25/2017); LOWER EXTREMITY INTERVENTION (10/25/2017); Hernia repair; Tonsillectomy; Lower Extremity Angiography (Right, 01/13/2018); Colonoscopy with propofol (N/A, 05/12/2018); Esophagogastroduodenoscopy (egd) with propofol (N/A, 05/12/2018); Amputation (Right, 06/02/2018); Givens capsule study (N/A, 07/13/2018); and Wound debridement (Right, 08/08/2018). Family: family history includes Diabetes in his father; Hyperlipidemia in his father; Hypertension in his father.  Constitutional Exam  General appearance: Well nourished, well developed, and well hydrated. In no apparent acute distress Vitals:   08/10/18 1013  BP: 105/82  Pulse: 66  Resp: 18  Temp: 98.1 F (36.7 C)  SpO2: 96%  Weight: 180 lb (81.6 kg)  Height: 5' 10" (1.778 m)   BMI Assessment: Estimated body mass index is 25.83 kg/m as calculated from the following:   Height as of this encounter: 5' 10" (1.778 m).   Weight as of this encounter: 180 lb (81.6 kg).  BMI interpretation table: BMI level Category Range association with higher incidence of chronic pain  <18 kg/m2 Underweight   18.5-24.9 kg/m2 Ideal body weight   25-29.9 kg/m2 Overweight Increased incidence by 20%  30-34.9 kg/m2 Obese (Class I) Increased incidence by 68%  35-39.9 kg/m2 Severe obesity (Class II) Increased incidence by 136%  >40 kg/m2 Extreme obesity (Class III) Increased incidence by 254%   Patient's current BMI Ideal Body weight  Body mass index  is 25.83 kg/m. Ideal body weight: 73 kg (160 lb 15 oz) Adjusted ideal body weight: 76.5 kg (168 lb 9 oz)   BMI Readings from Last 4 Encounters:  08/10/18 25.83 kg/m  08/08/18 25.56 kg/m  08/01/18 25.83 kg/m  07/20/18 26.54 kg/m   Wt Readings from Last 4 Encounters:  08/10/18 180 lb (81.6 kg)  08/08/18 178 lb 2.1 oz (80.8 kg)  08/01/18 180 lb (81.6 kg)  07/20/18 185 lb (83.9 kg)  Psych/Mental status: Alert, oriented x 3 (person, place, & time)       Eyes: PERLA Respiratory: No evidence of acute respiratory distress  Cervical Spine Area Exam  Skin & Axial Inspection: No masses, redness, edema, swelling, or associated skin lesions Alignment: Symmetrical Functional ROM: Unrestricted ROM      Stability: No instability detected Muscle Tone/Strength: Functionally intact. No obvious neuro-muscular anomalies detected. Sensory (Neurological): Unimpaired Palpation: No palpable anomalies              Upper Extremity (UE) Exam    Side: Right upper extremity  Side: Left upper extremity  Skin & Extremity Inspection: Skin color, temperature, and hair growth are WNL. No peripheral edema or cyanosis. No masses, redness, swelling, asymmetry, or associated skin lesions. No contractures.  Skin & Extremity Inspection: Skin color, temperature, and hair growth are WNL. No peripheral edema or cyanosis. No masses, redness, swelling, asymmetry, or associated skin lesions. No contractures.  Functional ROM: Unrestricted ROM          Functional ROM: Unrestricted ROM          Muscle Tone/Strength: Functionally intact. No obvious neuro-muscular anomalies detected.  Muscle Tone/Strength: Functionally intact. No obvious neuro-muscular anomalies detected.  Sensory (Neurological): Unimpaired          Sensory (Neurological): Unimpaired          Palpation: No palpable anomalies              Palpation: No palpable anomalies              Provocative Test(s):  Phalen's test: deferred Tinel's test: deferred Apley's  scratch test (touch opposite shoulder):  Action 1 (Across chest): deferred Action 2 (Overhead): deferred Action 3 (LB reach): deferred   Provocative Test(s):  Phalen's test: deferred Tinel's test: deferred Apley's scratch test (touch opposite shoulder):  Action 1 (Across chest): deferred Action 2 (Overhead): deferred Action 3 (LB reach): deferred    Thoracic Spine Area Exam  Skin & Axial Inspection: No masses, redness, or swelling Alignment: Symmetrical Functional ROM: Unrestricted ROM Stability: No instability detected Muscle Tone/Strength: Functionally intact. No obvious neuro-muscular anomalies detected. Sensory (Neurological): Unimpaired Muscle strength & Tone: No palpable  anomalies  Lumbar Spine Area Exam  Skin & Axial Inspection: No masses, redness, or swelling Alignment: Symmetrical Functional ROM: Unrestricted ROM       Stability: No instability detected Muscle Tone/Strength: Functionally intact. No obvious neuro-muscular anomalies detected. Sensory (Neurological): Unimpaired Palpation: No palpable anomalies       Provocative Tests: Hyperextension/rotation test: deferred today       Lumbar quadrant test ([name]'s test): deferred today       Lateral bending test: deferred today       Patrick's Maneuver: deferred today                   FABER* test: deferred today                   S-I anterior distraction/compression test: deferred today         S-I lateral compression test: deferred today         S-I Thigh-thrust test: deferred today         S-I Gaenslen's test: deferred today         *(Flexion, ABduction and External Rotation)  Gait & Posture Assessment  Ambulation: Unassisted Gait: Relatively normal for age and body habitus Posture: WNL   Lower Extremity Exam    Side: Right lower extremity  Side: Left lower extremity  Stability: No instability observed          Stability: No instability observed          Skin & Extremity Inspection: Skin color, temperature,  and hair growth are WNL. No peripheral edema or cyanosis. No masses, redness, swelling, asymmetry, or associated skin lesions. No contractures.  Skin & Extremity Inspection: Skin color, temperature, and hair growth are WNL. No peripheral edema or cyanosis. No masses, redness, swelling, asymmetry, or associated skin lesions. No contractures.  Functional ROM: Unrestricted ROM                  Functional ROM: Unrestricted ROM                  Muscle Tone/Strength: Functionally intact. No obvious neuro-muscular anomalies detected.  Muscle Tone/Strength: Functionally intact. No obvious neuro-muscular anomalies detected.  Sensory (Neurological): Unimpaired        Sensory (Neurological): Unimpaired        DTR: Patellar: deferred today Achilles: deferred today Plantar: deferred today  DTR: Patellar: deferred today Achilles: deferred today Plantar: deferred today  Palpation: No palpable anomalies  Palpation: No palpable anomalies   Assessment  Primary Diagnosis & Pertinent Problem List: The primary encounter diagnosis was Stump pain ([place]) (Right). Diagnoses of S/P bilateral BKA (below knee amputation) ([place]), Alcoholic peripheral neuropathy ([place]), Chronic pain of right lower extremity, Amputation stump infection ([place]), Chronic anticoagulation (ELIQUIS), and Neurogenic pain were also pertinent to this visit.  Status Diagnosis  Improving Slowly healing Stable 1. Stump pain ([name]) (Right)   2. S/P bilateral BKA (below knee amputation) ([place])   3. Alcoholic peripheral neuropathy ([place])   4. Chronic pain of right lower extremity   5. Amputation stump infection ([place])   6. Chronic anticoagulation (ELIQUIS)   7. Neurogenic pain     Problems updated and reviewed during this visit: Problem  Neurogenic Pain  Pharmacologic Therapy   Plan of Care  Pharmacotherapy (Medications Ordered): No orders of the defined types were placed in this encounter.  Medications administered today: [name] Montone Jr.  "Ben" had no medications administered during this visit.  Procedure Orders    No  procedure(s) ordered today   Lab Orders  No laboratory test(s) ordered today   Imaging Orders  No imaging studies ordered today   Referral Orders  No referral(s) requested today   Interventional management options: Planned, scheduled, and/or pending:   None at this time.   Considering:   None at this time.   Palliative PRN treatment(s):   None at this time   Provider-requested follow-up: No follow-ups on file.  Future Appointments  Date Time Provider [place]  08/11/2018  3:30 PM [name], DO [facility]  08/12/2018  9:00 AM [facility]  08/22/2018 10:15 AM Kris Hartmann, NP [facility] None  09/13/2018  3:30 PM [facility] LAB [facility] None  09/14/2018  1:15 PM Earlie Server, MD [facility] None  09/14/2018  1:45 PM [facility] INFUSION CHAIR 3 [facility] None  10/03/2018  2:00 PM Minna Merritts, MD [id number]  11/17/2018  2:45 PM [name], MD AGI-AGIB None  03/17/2019  2:00 PM AVVS VASC 3 AVVS-IMG None  03/17/2019  3:00 PM Dew, Erskine Squibb, MD AVVS-AVVS None   Primary Care Physician: [name], DO Location: [facility] Note by: Gaspar Cola, MD Date: 08/10/2018; Time: 11:25 AM

## 2018-08-08 ENCOUNTER — Other Ambulatory Visit (INDEPENDENT_AMBULATORY_CARE_PROVIDER_SITE_OTHER): Payer: Self-pay | Admitting: Nurse Practitioner

## 2018-08-08 ENCOUNTER — Other Ambulatory Visit: Payer: Self-pay

## 2018-08-08 ENCOUNTER — Ambulatory Visit: Payer: Medicare HMO | Admitting: Registered Nurse

## 2018-08-08 ENCOUNTER — Ambulatory Visit
Admission: RE | Admit: 2018-08-08 | Discharge: 2018-08-08 | Disposition: A | Discharge status: Home / Self Care | Payer: Medicare HMO | Attending: Vascular Surgery | Admitting: Vascular Surgery

## 2018-08-08 ENCOUNTER — Encounter
Admission: RE | Disposition: A | Discharge status: Home / Self Care | Payer: Self-pay | Source: Home / Self Care | Attending: Vascular Surgery

## 2018-08-08 DIAGNOSIS — T8789 Other complications of amputation stump: Secondary | ICD-10-CM

## 2018-08-08 DIAGNOSIS — T8743 Infection of amputation stump, right lower extremity: Secondary | ICD-10-CM | POA: Diagnosis not present

## 2018-08-08 DIAGNOSIS — I252 Old myocardial infarction: Secondary | ICD-10-CM | POA: Insufficient documentation

## 2018-08-08 DIAGNOSIS — Z7984 Long term (current) use of oral hypoglycemic drugs: Secondary | ICD-10-CM | POA: Diagnosis not present

## 2018-08-08 DIAGNOSIS — I2510 Atherosclerotic heart disease of native coronary artery without angina pectoris: Secondary | ICD-10-CM | POA: Diagnosis not present

## 2018-08-08 DIAGNOSIS — Z87891 Personal history of nicotine dependence: Secondary | ICD-10-CM | POA: Insufficient documentation

## 2018-08-08 DIAGNOSIS — I10 Essential (primary) hypertension: Secondary | ICD-10-CM | POA: Insufficient documentation

## 2018-08-08 DIAGNOSIS — T8781 Dehiscence of amputation stump: Secondary | ICD-10-CM | POA: Diagnosis not present

## 2018-08-08 DIAGNOSIS — Z8673 Personal history of transient ischemic attack (TIA), and cerebral infarction without residual deficits: Secondary | ICD-10-CM | POA: Insufficient documentation

## 2018-08-08 DIAGNOSIS — E1140 Type 2 diabetes mellitus with diabetic neuropathy, unspecified: Secondary | ICD-10-CM | POA: Diagnosis not present

## 2018-08-08 DIAGNOSIS — M79609 Pain in unspecified limb: Secondary | ICD-10-CM

## 2018-08-08 DIAGNOSIS — E1151 Type 2 diabetes mellitus with diabetic peripheral angiopathy without gangrene: Secondary | ICD-10-CM | POA: Insufficient documentation

## 2018-08-08 DIAGNOSIS — E785 Hyperlipidemia, unspecified: Secondary | ICD-10-CM | POA: Diagnosis not present

## 2018-08-08 DIAGNOSIS — Z89511 Acquired absence of right leg below knee: Secondary | ICD-10-CM | POA: Insufficient documentation

## 2018-08-08 DIAGNOSIS — T8453XA Infection and inflammatory reaction due to internal right knee prosthesis, initial encounter: Secondary | ICD-10-CM | POA: Diagnosis not present

## 2018-08-08 DIAGNOSIS — K219 Gastro-esophageal reflux disease without esophagitis: Secondary | ICD-10-CM | POA: Insufficient documentation

## 2018-08-08 DIAGNOSIS — Z955 Presence of coronary angioplasty implant and graft: Secondary | ICD-10-CM | POA: Insufficient documentation

## 2018-08-08 DIAGNOSIS — T8753 Necrosis of amputation stump, right lower extremity: Secondary | ICD-10-CM | POA: Diagnosis not present

## 2018-08-08 HISTORY — PX: WOUND DEBRIDEMENT: SHX247

## 2018-08-08 LAB — GLUCOSE, CAPILLARY
Glucose-Capillary: 171 mg/dL — ABNORMAL HIGH (ref 70–99)
Glucose-Capillary: 175 mg/dL — ABNORMAL HIGH (ref 70–99)

## 2018-08-08 SURGERY — DEBRIDEMENT, WOUND
Anesthesia: General | Laterality: Right

## 2018-08-08 MED ORDER — OXYCODONE HCL 5 MG PO TABS
ORAL_TABLET | ORAL | Status: AC
  Filled 2018-08-08: qty 1

## 2018-08-08 MED ORDER — HYDROMORPHONE HCL 1 MG/ML IJ SOLN
INTRAMUSCULAR | Status: AC
  Administered 2018-08-08: 1 mg via INTRAVENOUS
  Filled 2018-08-08: qty 1

## 2018-08-08 MED ORDER — DEXMEDETOMIDINE HCL IN NACL 200 MCG/50ML IV SOLN
INTRAVENOUS | Status: AC
  Filled 2018-08-08: qty 50

## 2018-08-08 MED ORDER — ONDANSETRON HCL 4 MG/2ML IJ SOLN: 4.0000 mg | Freq: Four times a day (QID) | INTRAMUSCULAR | Status: DC | PRN

## 2018-08-08 MED ORDER — OXYCODONE HCL 5 MG PO TABS
5.0000 mg | ORAL_TABLET | Freq: Once | ORAL | Status: AC
  Administered 2018-08-08: 5 mg via ORAL
  Filled 2018-08-08: qty 1

## 2018-08-08 MED ORDER — PROMETHAZINE HCL 25 MG/ML IJ SOLN: 6.2500 mg | INTRAMUSCULAR | Status: DC | PRN

## 2018-08-08 MED ORDER — FENTANYL CITRATE (PF) 100 MCG/2ML IJ SOLN
INTRAMUSCULAR | Status: AC
  Administered 2018-08-08: 25 ug via INTRAVENOUS
  Filled 2018-08-08: qty 2

## 2018-08-08 MED ORDER — OXYCODONE HCL 5 MG PO TABS: 5.0000 mg | ORAL_TABLET | Freq: Once | ORAL | Status: DC | PRN

## 2018-08-08 MED ORDER — FENTANYL CITRATE (PF) 100 MCG/2ML IJ SOLN
INTRAMUSCULAR | Status: AC
  Filled 2018-08-08: qty 2

## 2018-08-08 MED ORDER — ONDANSETRON HCL 4 MG/2ML IJ SOLN
INTRAMUSCULAR | Status: DC | PRN
  Administered 2018-08-08: 4 mg via INTRAVENOUS

## 2018-08-08 MED ORDER — CEFAZOLIN SODIUM-DEXTROSE 2-4 GM/100ML-% IV SOLN
INTRAVENOUS | Status: AC
  Filled 2018-08-08: qty 100

## 2018-08-08 MED ORDER — PROPOFOL 10 MG/ML IV BOLUS
INTRAVENOUS | Status: DC | PRN
  Administered 2018-08-08: 150 mg via INTRAVENOUS

## 2018-08-08 MED ORDER — ACETAMINOPHEN 10 MG/ML IV SOLN
INTRAVENOUS | Status: AC
  Filled 2018-08-08: qty 100

## 2018-08-08 MED ORDER — OXYCODONE HCL 5 MG PO TABS: 5.0000 mg | ORAL_TABLET | Freq: Four times a day (QID) | ORAL | 0 refills | Status: DC | PRN

## 2018-08-08 MED ORDER — LIDOCAINE HCL (CARDIAC) PF 100 MG/5ML IV SOSY
PREFILLED_SYRINGE | INTRAVENOUS | Status: DC | PRN
  Administered 2018-08-08: 40 mg via INTRAVENOUS
  Administered 2018-08-08: 60 mg via INTRAVENOUS

## 2018-08-08 MED ORDER — CEFAZOLIN SODIUM-DEXTROSE 2-3 GM-%(50ML) IV SOLR
INTRAVENOUS | Status: DC | PRN
  Administered 2018-08-08: 2 g via INTRAVENOUS

## 2018-08-08 MED ORDER — PROPOFOL 10 MG/ML IV BOLUS
INTRAVENOUS | Status: AC
  Filled 2018-08-08: qty 20

## 2018-08-08 MED ORDER — FENTANYL CITRATE (PF) 100 MCG/2ML IJ SOLN
25.0000 ug | INTRAMUSCULAR | Status: DC | PRN
  Administered 2018-08-08: 50 ug via INTRAVENOUS
  Administered 2018-08-08: 25 ug via INTRAVENOUS
  Administered 2018-08-08: 50 ug via INTRAVENOUS
  Administered 2018-08-08: 25 ug via INTRAVENOUS

## 2018-08-08 MED ORDER — FENTANYL CITRATE (PF) 100 MCG/2ML IJ SOLN
INTRAMUSCULAR | Status: AC
  Administered 2018-08-08: 50 ug via INTRAVENOUS
  Filled 2018-08-08: qty 2

## 2018-08-08 MED ORDER — HYDROMORPHONE HCL 1 MG/ML IJ SOLN
1.0000 mg | Freq: Once | INTRAMUSCULAR | Status: AC | PRN
  Administered 2018-08-08: 1 mg via INTRAVENOUS

## 2018-08-08 MED ORDER — DEXMEDETOMIDINE HCL 200 MCG/2ML IV SOLN
INTRAVENOUS | Status: DC | PRN
  Administered 2018-08-08: 4 ug via INTRAVENOUS
  Administered 2018-08-08: 8 ug via INTRAVENOUS

## 2018-08-08 MED ORDER — MEPERIDINE HCL 50 MG/ML IJ SOLN: 6.2500 mg | INTRAMUSCULAR | Status: DC | PRN

## 2018-08-08 MED ORDER — OXYCODONE HCL 5 MG/5ML PO SOLN: 5.0000 mg | Freq: Once | ORAL | Status: DC | PRN

## 2018-08-08 MED ORDER — PHENYLEPHRINE HCL 10 MG/ML IJ SOLN
INTRAMUSCULAR | Status: DC | PRN
  Administered 2018-08-08 (×3): 100 ug via INTRAVENOUS

## 2018-08-08 MED ORDER — SODIUM CHLORIDE 0.9 % IV SOLN
INTRAVENOUS | Status: DC
  Administered 2018-08-08: 1000 mL via INTRAVENOUS

## 2018-08-08 MED ORDER — ACETAMINOPHEN 10 MG/ML IV SOLN
INTRAVENOUS | Status: DC | PRN
  Administered 2018-08-08: 1000 mg via INTRAVENOUS

## 2018-08-08 MED ORDER — LIDOCAINE HCL (PF) 2 % IJ SOLN
INTRAMUSCULAR | Status: AC
  Filled 2018-08-08: qty 10

## 2018-08-08 MED ORDER — FENTANYL CITRATE (PF) 100 MCG/2ML IJ SOLN
INTRAMUSCULAR | Status: DC | PRN
  Administered 2018-08-08 (×3): 50 ug via INTRAVENOUS

## 2018-08-08 MED ORDER — SEVOFLURANE IN SOLN
RESPIRATORY_TRACT | Status: AC
  Filled 2018-08-08: qty 250

## 2018-08-08 MED ORDER — ONDANSETRON HCL 4 MG/2ML IJ SOLN
INTRAMUSCULAR | Status: AC
  Filled 2018-08-08: qty 2

## 2018-08-08 SURGICAL SUPPLY — 35 items
BRUSH SCRUB EZ  4% CHG (MISCELLANEOUS) ×1
BRUSH SCRUB EZ 4% CHG (MISCELLANEOUS) ×1 IMPLANT
CANISTER SUCT 1200ML W/VALVE (MISCELLANEOUS) ×2 IMPLANT
CHLORAPREP W/TINT 26ML (MISCELLANEOUS) ×2 IMPLANT
COVER WAND RF STERILE (DRAPES) ×2 IMPLANT
DRAPE INCISE IOBAN 66X45 STRL (DRAPES) ×2 IMPLANT
DRSG VAC ATS MED SENSATRAC (GAUZE/BANDAGES/DRESSINGS) ×2 IMPLANT
ELECT CAUTERY BLADE 6.4 (BLADE) ×2 IMPLANT
ELECT REM PT RETURN 9FT ADLT (ELECTROSURGICAL) ×2
ELECTRODE REM PT RTRN 9FT ADLT (ELECTROSURGICAL) ×1 IMPLANT
GLOVE BIO SURGEON STRL SZ7 (GLOVE) ×2 IMPLANT
GOWN STRL REUS W/ TWL LRG LVL3 (GOWN DISPOSABLE) ×1 IMPLANT
GOWN STRL REUS W/ TWL XL LVL3 (GOWN DISPOSABLE) ×1 IMPLANT
GOWN STRL REUS W/TWL LRG LVL3 (GOWN DISPOSABLE) ×1
GOWN STRL REUS W/TWL XL LVL3 (GOWN DISPOSABLE) ×1
IV NS 1000ML (IV SOLUTION) ×1
IV NS 1000ML BAXH (IV SOLUTION) ×1 IMPLANT
KIT TURNOVER KIT A (KITS) ×2 IMPLANT
LABEL OR SOLS (LABEL) ×2 IMPLANT
NS IRRIG 500ML POUR BTL (IV SOLUTION) ×2 IMPLANT
PACK EXTREMITY ARMC (MISCELLANEOUS) ×2 IMPLANT
PAD PREP 24X41 OB/GYN DISP (PERSONAL CARE ITEMS) ×2 IMPLANT
PULSAVAC PLUS IRRIG FAN TIP (DISPOSABLE)
SOL PREP PVP 2OZ (MISCELLANEOUS) ×2
SOLUTION PREP PVP 2OZ (MISCELLANEOUS) ×1 IMPLANT
SPONGE LAP 18X18 RF (DISPOSABLE) ×2 IMPLANT
SUT ETHILON 4-0 (SUTURE)
SUT ETHILON 4-0 FS2 18XMFL BLK (SUTURE)
SUT VIC AB 3-0 SH 27 (SUTURE)
SUT VIC AB 3-0 SH 27X BRD (SUTURE) ×2 IMPLANT
SUTURE ETHLN 4-0 FS2 18XMF BLK (SUTURE) ×1 IMPLANT
SWAB CULTURE AMIES ANAERIB BLU (MISCELLANEOUS) ×2 IMPLANT
SYR BULB 3OZ (MISCELLANEOUS) ×2 IMPLANT
TIP FAN IRRIG PULSAVAC PLUS (DISPOSABLE) ×1 IMPLANT
WND VAC CANISTER 500ML (MISCELLANEOUS) ×2 IMPLANT

## 2018-08-08 NOTE — Anesthesia Procedure Notes (Signed)
Procedure Name: LMA Insertion Date/Time: 08/08/2018 8:38 AM Performed by: Jonna Clark, CRNA Pre-anesthesia Checklist: Patient identified, Patient being monitored, Timeout performed, Emergency Drugs available and Suction available Patient Re-evaluated:Patient Re-evaluated prior to induction Oxygen Delivery Method: Circle system utilized Preoxygenation: Pre-oxygenation with 100% oxygen Induction Type: IV induction Ventilation: Mask ventilation without difficulty LMA: LMA inserted LMA Size: 4.0 Tube type: Oral Number of attempts: 1 Placement Confirmation: positive ETCO2 and breath sounds checked- equal and bilateral Tube secured with: Tape Dental Injury: Teeth and Oropharynx as per pre-operative assessment

## 2018-08-08 NOTE — Anesthesia Preprocedure Evaluation (Signed)
Anesthesia Evaluation  Patient identified by MRN, date of birth, ID band Patient awake    Reviewed: Allergy & Precautions, NPO status , Patient's Chart, lab work & pertinent test results  History of Anesthesia Complications Negative for: history of anesthetic complications  Airway Mallampati: II  TM Distance: >3 FB Neck ROM: Full    Dental  (+) Poor Dentition, Missing   Pulmonary neg sleep apnea, neg COPD, former smoker,    breath sounds clear to auscultation- rhonchi (-) wheezing      Cardiovascular hypertension, Pt. on medications + Peripheral Vascular Disease  (-) CAD, (-) Past MI, (-) Cardiac Stents and (-) CABG + dysrhythmias Atrial Fibrillation  Rhythm:Regular Rate:Normal - Systolic murmurs and - Diastolic murmurs    Neuro/Psych  Headaches, CVA, No Residual Symptoms negative psych ROS   GI/Hepatic Neg liver ROS, GERD  ,  Endo/Other  diabetes, Oral Hypoglycemic Agents  Renal/GU negative Renal ROS     Musculoskeletal negative musculoskeletal ROS (+)   Abdominal (+) - obese,   Peds  Hematology  (+) anemia ,   Anesthesia Other Findings Past Medical History: 02/17/2015: Acute left PCA stroke ([name]) No date: Anemia 05/08/2018: Atherosclerosis of native arteries of the extremities with  gangrene ([place]) No date: Atrial fibrillation ([place]) No date: Blind     Comment:  right eye No date: Diabetes mellitus with complication ([place]) No date: Dilated aortic root ([place])     Comment:  a. 04/2018 Echo: 4.1cm. Asc Ao 3.5cm. No date: Dysrhythmia 06/02/2018: Gangrene of right foot ([place]) No date: GERD (gastroesophageal reflux disease) No date: History of echocardiogram     Comment:  a. 04/2018 Echo: Ef 60-65%, no rwma, midly to mod dil Ao               root - 4.1cm. Asc Ao 3.5cm. Mild MR. Nl RV fxn. Nl PASP. No date: History of hernia repair No date: History of stress test     Comment:  a. 2016 MV ([name]): EF 58%, no  ischemia. No date: Hyperlipidemia No date: Hypertension No date: Leg pain No date: Legally blind 02/21/2018: Necrotic toes ([place]) No date: PAD (peripheral artery disease) ([place]) No date: PAF (paroxysmal atrial fibrillation) ([name])     Comment:  a. on Eliquis as of 2018; b. CHADS2VASc => 5 (HTN, DM,               stroke x 2, vascular disease) No date: Peripheral vascular disease ([place])     Comment:  a. followed by Dr. Lucky Cowboy; b. s/p kissing balloon stents               and right external iliac stent in 10/2017; c. LE               angiogrpahy 01/2018: No significant arterial occlusive               disease in the lower extremities. No date: Pulmonary nodules No date: Stroke [facility])     Comment:  a. 2016 & 2018 07/11/2018: Stroke ([place])   Reproductive/Obstetrics                             Anesthesia Physical Anesthesia Plan  ASA: III  Anesthesia Plan: General   Post-op Pain Management:    Induction: Intravenous  PONV Risk Score and Plan: 1 and Ondansetron and Midazolam  Airway Management Planned: LMA  Additional Equipment:   Intra-op Plan:   Post-operative Plan:  Informed Consent: I have reviewed the patients History and Physical, chart, labs and discussed the procedure including the risks, benefits and alternatives for the proposed anesthesia with the patient or authorized representative who has indicated his/her understanding and acceptance.   Dental advisory given  Plan Discussed with: CRNA and Anesthesiologist  Anesthesia Plan Comments:         Anesthesia Quick Evaluation

## 2018-08-08 NOTE — Op Note (Signed)
    OPERATIVE NOTE   PROCEDURE: 1. 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 is a 63 y.o. male who presents with a dark eschar and previous drainage of his right below-knee amputation site.  He had an MRI which questioned a connection and draining sinus to the bone.  At this point, to unroofed the eschar and get down to healthier tissue as well as to evaluate the deeper tissues debridement is performed with negative pressure dressing planned.  Risks and benefits are discussed with he and his wife and they are agreeable to proceed.  DESCRIPTION: After obtaining full informed written consent, the patient was brought back to the operating room and placed supine upon the operating table.  The patient received IV antibiotics prior to induction.  After obtaining adequate anesthesia, the patient was prepped and draped in the standard fashion for.  The wound was then opened and excisional debridement was performed to the right below-knee amputation site to remove all clearly non-viable tissue.  This encompassed removing a large eschar from the wound and the base of the stump involving the skin and soft tissue as well as some muscle and some of the areas.  The tissue was taken back to bleeding tissue that appeared viable.  The debridement was performed with a scalpel largely with some use of Metzenbaum scissors and electrocautery as well and encompassed an area of approximately 150 cm2.  At the widest, the wound was about 15 to 17 cm and at the furthest with it was about 10 to 11 cm.   The wound was irrigated copiously with pulse lavage saline.  After all clearly non-viable tissue was removed, there was no obvious draining sinus down to the bone and no purulent material identified.  The underlying tissue appeared reasonably healthy and there was no bone exposed.  I then used a medium VAC sponge basically in its entirety and use strips of [place] to get an occlusive seal.  It was connected to suction and a good seal was obtained. The patient was then awakened from anesthesia and taken to the recovery room in stable condition having tolerated the procedure well.  COMPLICATIONS: none  CONDITION: stable  Leotis Pain  08/08/2018, 9:13 AM   This note was created with Dragon Medical transcription system. Any errors in dictation are purely unintentional.

## 2018-08-08 NOTE — Transfer of Care (Signed)
Immediate Anesthesia Transfer of Care Note  Patient: [name].  Procedure(s) Performed: DEBRIDEMENT WOUND WITH WOUND VAC APPLICATION (Right )  Patient Location: PACU  Anesthesia Type:General  Level of Consciousness: drowsy and patient cooperative  Airway & Oxygen Therapy: O2 by simple mask  Post-op Assessment: Report given to RN and Post -op Vital signs reviewed and stable  Post vital signs: Reviewed and stable  Last Vitals:  Vitals Value Taken Time  BP 96/68 08/08/2018  9:26 AM  Temp    Pulse 72 08/08/2018  9:26 AM  Resp 8 08/08/2018  9:26 AM  SpO2 98 % 08/08/2018  9:26 AM  Vitals shown include unvalidated device data.  Last Pain:  Vitals:   08/08/18 0635  TempSrc: Tympanic  PainSc: 5          Complications: No apparent anesthesia complications

## 2018-08-08 NOTE — Discharge Instructions (Addendum)
Surgical Wound Debridement, Care After Refer to this sheet in the next few weeks. These instructions provide you with information about caring for yourself after your procedure. Your health care provider may also give you more specific instructions. Your treatment has been planned according to current medical practices, but problems sometimes occur. Call your health care provider if you have any problems or questions after your procedure. What can I expect after the procedure? After the procedure, it is common to have:  Pain or soreness.  Fluid that leaks from the wound.  Stiffness. Follow these instructions at home: Medicines  Take over-the-counter and prescription medicines only as told by your health care provider.  If you were prescribed an antibiotic medicine, take it or apply it as told by your health care provider. Do not stop taking or using the antibiotic even if your condition improves. Wound care  Follow instructions from your health care provider about: ? How to take care of your wound. ? When and how you should change your dressing. ? When you should remove your dressing. If your dressing is dry and stuck when you try to remove it, moisten or wet the dressing with saline or water so that it can be removed without harming your skin or wound tissue.  Check your wound every day for signs of infection. Have a caregiver do this for you if you are not able. Watch for: ? More redness, swelling, or pain. ? More fluid, blood, or pus. ? A bad smell. Activity   Do not drive or operate heavy machinery while taking prescription pain medicine.  Ask your health care provider what activities are safe for you. General instructions  Eat a healthy diet with lots of protein. Ask your health care provider to suggest the best diet for you.  Do not smoke. Smoking makes it harder for your body to heal.  Keep all follow-up visits as told by your health care provider. This is  important.  Do not take baths, swim, or use a hot tub until your health care provider approves. Contact a health care provider if:  You have a fever.  Your pain medicine is not helping.  Your wound is red and swollen.  You have increased bleeding.  You have pus coming from your wound.  You have a bad smell coming from your wound.  Your wound is not getting better after 1-2 weeks of treatment.  You develop a new medical condition, such as diabetes, peripheral vascular disease, or conditions that affect your defense (immune) system. This information is not intended to replace advice given to you by your health care provider. Make sure you discuss any questions you have with your health care provider. Document Released: 07/06/2012 Document Revised: 12/25/2015 Document Reviewed: 11/28/2014 Elsevier Interactive Patient Education  2019 [employer]   Negative Pressure Wound Therapy Dressing Care Negative pressure wound therapy (NPWT) is a device that helps wounds heal. NPWT helps the wound stay clean and healthy while it heals from the inside. NPWT uses a bandage (dressing) that is made of a sponge or gauze-like material. The dressing is placed in or inside the wound. The wound is then covered and sealed with a cover dressing that sticks to your skin (adhesive). This keeps air out. A tube connects the cover dressing to a small pump. The pump sucks fluid and germs from the wound. The pump also controls any odor coming from the wound. What are the risks? NPWT is usually safe to use. The most  common problem is skin irritation from the dressing adhesive, but there are many ways to prevent this from happening. However, more serious problems can develop, such as:  Bleeding.  Infection.  Dehydration.  Pain. How to change your dressing How often you change your dressing depends on your wound. If the pump is off for more than two hours, the dressing will need to be changed. Follow your  health care providers instructions on how often to change it. Your health care provider may change your dressing, or a family member, friend, or caregiver may be shown how to change the dressing. It is important to:  Wear gloves and protective clothing while changing a dressing. This may include eye protection.  Never let anyone change your dressing if he or she has an infection, skin condition, or skin wound or cut of any size. Preparing to change your dressing  If needed, take pain medicine 30 minutes before the dressing change as prescribed by your health care provider.  Set up a clean station for wound care. You will need: ? A disposable garbage bag that is open and ready to use. ? Hand sanitizer. ? Wound cleanser or saltwater solution (saline) as told by your health care provider. ? New dressing material or bandages. Make sure to open the dressing package so that the dressing remains on the inside of the package. You may also need the following in your clean station:  A box of vinyl gloves.  Tape.  Skin protectant. This may be a wipe, film, or spray.  Clean or germ-free (sterile) scissors.  Wound liner.  Cotton tip applicators. Removing your old dressing  Wash your hands with soap and water. Dry your hands with a clean towel. If soap and water are not available, use hand sanitizer.  Put on gloves.  Turn off the pump and disconnect the tubing from the dressing.  Carefully remove the adhesive cover dressing in the direction of your hair growth. Only touch the outside edges of the dressing.  Remove the dressing that is inside the wound. If the dressing sticks, use a wound cleanser or saline solution to wet the dressing. This helps it come off more easily.  Throw the old dressing supplies into the ready garbage bag.  Remove your gloves by grabbing the cuff and turning the glove inside out. Place the gloves in the trash immediately.  Wash your hands with soap and water.  Dry your hands with a clean towel. If soap and water are not available, use hand sanitizer. Cleaning your wound  Follow your health care provider's instructions on how to clean your wound. This may include using a saline or recommended wound cleanser.  Do not use over-the-counter medicated or antiseptic creams, sprays, liquids, or dressings unless told to do so by your health care provider.  Clean the area thoroughly with the recommended saline solution or wound cleanser and a clean gauze pad.  Throw the gauze pad into the garbage bag.  Wash your hands with soap and water. Dry your hands with a clean towel. If soap and water are not available, use hand sanitizer. Applying the dressing  Apply a skin protectant to any skin that will be exposed to adhesive. Let the skin protectant dry.  Put a new dressing into the wound.  Apply a new cover dressing and tube.  Take off your gloves. Put them in the plastic bag with the old dressing. Tie the bag shut and throw it away.  Wash your hands with  soap and water. Dry your hands with a clean towel. If soap and water are not available, use hand sanitizer.  Attach the suction and turn the pump back on. Do not change the settings on the machine without talking to a health care provider.  Replace the container in the pump that collects fluid if it is full. Do this at least once a week. Contact a health care provider if:  You have new pain.  You develop irritation, a rash, or itching around the wound or dressing.  You see new black or yellow tissue in your wound.  The dressing changes are painful or cause bleeding.  The pump has been off for more than two hours and you do not know how to change the dressing.  The alarm for the pump goes off and you do not know what to do. Get help right away if:  You have a lot of bleeding.  You see a sudden change in the color or texture of the drainage.  The wound breaks open.  You have severe  pain.  You have signs of infection, such as: ? More redness, swelling, or pain. ? More fluid or blood. ? Warmth. ? Pus or a bad smell. ? Red streaks leading from wound. ? A fever. This information is not intended to replace advice given to you by your health care provider. Make sure you discuss any questions you have with your health care provider. Document Released: 10/12/2011 Document Revised: 08/15/2015 Document Reviewed: 04/25/2015 Elsevier Interactive Patient Education  2019 [place]   1) The drugs that you were given will stay in your system until tomorrow so for the next 24 hours you should not:  A) Drive an automobile B) Make any legal decisions C) Drink any alcoholic beverage   2) You may resume regular meals tomorrow.  Today it is better to start with liquids and gradually work up to solid foods.  You may eat anything you prefer, but it is better to start with liquids, then soup and crackers, and gradually work up to solid foods.   3) Please notify your doctor immediately if you have any unusual bleeding, trouble breathing, redness and pain at the surgery site, drainage, fever, or pain not relieved by medication.    4) Additional Instructions:        Please contact your physician with any problems or Same Day Surgery at [phone], Monday through Friday 6 am to 4 pm, or [place] at [facility] number at [phone].

## 2018-08-08 NOTE — Anesthesia Postprocedure Evaluation (Signed)
Anesthesia Post Note  Patient: [name].  Procedure(s) Performed: DEBRIDEMENT WOUND WITH WOUND VAC APPLICATION (Right )  Patient location during evaluation: PACU Anesthesia Type: General Level of consciousness: awake and alert and oriented Pain management: pain level controlled Vital Signs Assessment: post-procedure vital signs reviewed and stable Respiratory status: spontaneous breathing, nonlabored ventilation and respiratory function stable Cardiovascular status: blood pressure returned to baseline and stable Postop Assessment: no signs of nausea or vomiting Anesthetic complications: no     Last Vitals:  Vitals:   08/08/18 1037 08/08/18 1118  BP: 112/68 120/77  Pulse: 83 81  Resp: 18 18  Temp: (!) 36.3 C   SpO2: 93% 98%    Last Pain:  Vitals:   08/08/18 1118  TempSrc:   PainSc: 4                  Bekka Qian

## 2018-08-08 NOTE — Anesthesia Post-op Follow-up Note (Signed)
Anesthesia QCDR form completed.        

## 2018-08-08 NOTE — H&P (Signed)
[facility] History & Physical Update  The patient was interviewed and re-examined.  The patient's previous History and Physical has been reviewed and is unchanged.  There is no change in the plan of care. We plan to proceed with the scheduled procedure.  Leotis Pain, MD  08/08/2018, 8:15 AM

## 2018-08-09 ENCOUNTER — Telehealth (INDEPENDENT_AMBULATORY_CARE_PROVIDER_SITE_OTHER): Payer: Self-pay

## 2018-08-09 DIAGNOSIS — Z89511 Acquired absence of right leg below knee: Secondary | ICD-10-CM | POA: Diagnosis not present

## 2018-08-09 DIAGNOSIS — I96 Gangrene, not elsewhere classified: Secondary | ICD-10-CM | POA: Diagnosis not present

## 2018-08-09 NOTE — Telephone Encounter (Signed)
Amy from [facility] called and left a message on the triage line and stated that she would like verbal orders to continue speech therapy with the patient. Please advise  [phone]

## 2018-08-10 ENCOUNTER — Encounter: Payer: Self-pay | Admitting: Pain Medicine

## 2018-08-10 ENCOUNTER — Other Ambulatory Visit: Payer: Self-pay

## 2018-08-10 ENCOUNTER — Ambulatory Visit: Payer: Medicare HMO | Attending: Pain Medicine | Admitting: Pain Medicine

## 2018-08-10 VITALS — BP 105/82 | HR 66 | Temp 98.1°F | Resp 18 | Ht 70.0 in | Wt 180.0 lb

## 2018-08-10 DIAGNOSIS — M792 Neuralgia and neuritis, unspecified: Secondary | ICD-10-CM | POA: Diagnosis not present

## 2018-08-10 DIAGNOSIS — E785 Hyperlipidemia, unspecified: Secondary | ICD-10-CM | POA: Diagnosis not present

## 2018-08-10 DIAGNOSIS — Z7901 Long term (current) use of anticoagulants: Secondary | ICD-10-CM | POA: Diagnosis not present

## 2018-08-10 DIAGNOSIS — G8929 Other chronic pain: Secondary | ICD-10-CM | POA: Diagnosis not present

## 2018-08-10 DIAGNOSIS — Z4781 Encounter for orthopedic aftercare following surgical amputation: Secondary | ICD-10-CM | POA: Diagnosis not present

## 2018-08-10 DIAGNOSIS — M79609 Pain in unspecified limb: Secondary | ICD-10-CM | POA: Diagnosis not present

## 2018-08-10 DIAGNOSIS — R69 Illness, unspecified: Secondary | ICD-10-CM | POA: Diagnosis not present

## 2018-08-10 DIAGNOSIS — I69351 Hemiplegia and hemiparesis following cerebral infarction affecting right dominant side: Secondary | ICD-10-CM | POA: Diagnosis not present

## 2018-08-10 DIAGNOSIS — I77810 Thoracic aortic ectasia: Secondary | ICD-10-CM | POA: Diagnosis not present

## 2018-08-10 DIAGNOSIS — T8740 Infection of amputation stump, unspecified extremity: Secondary | ICD-10-CM | POA: Diagnosis not present

## 2018-08-10 DIAGNOSIS — I480 Paroxysmal atrial fibrillation: Secondary | ICD-10-CM | POA: Diagnosis not present

## 2018-08-10 DIAGNOSIS — G621 Alcoholic polyneuropathy: Secondary | ICD-10-CM | POA: Diagnosis present

## 2018-08-10 DIAGNOSIS — T8789 Other complications of amputation stump: Secondary | ICD-10-CM | POA: Diagnosis not present

## 2018-08-10 DIAGNOSIS — Z89512 Acquired absence of left leg below knee: Secondary | ICD-10-CM | POA: Diagnosis not present

## 2018-08-10 DIAGNOSIS — R911 Solitary pulmonary nodule: Secondary | ICD-10-CM | POA: Diagnosis not present

## 2018-08-10 DIAGNOSIS — M79604 Pain in right leg: Secondary | ICD-10-CM | POA: Diagnosis not present

## 2018-08-10 DIAGNOSIS — D649 Anemia, unspecified: Secondary | ICD-10-CM | POA: Diagnosis not present

## 2018-08-10 DIAGNOSIS — E1152 Type 2 diabetes mellitus with diabetic peripheral angiopathy with gangrene: Secondary | ICD-10-CM | POA: Diagnosis not present

## 2018-08-10 DIAGNOSIS — I10 Essential (primary) hypertension: Secondary | ICD-10-CM | POA: Diagnosis not present

## 2018-08-10 DIAGNOSIS — Z89511 Acquired absence of right leg below knee: Secondary | ICD-10-CM

## 2018-08-10 NOTE — Telephone Encounter (Signed)
[name] is aware. 

## 2018-08-10 NOTE — Patient Instructions (Addendum)
______________________________________________________________________________________________  Specialty Pain Scale  Introduction:  There are significant differences in how pain is reported. The word pain usually refers to physical pain, but it is also a common synonym of suffering. The medical community uses a scale from 0 (zero) to 10 (ten) to report pain level. Zero (0) is described as "no pain", while ten (10) is described as "the worse pain you can imagine". The problem with this scale is that physical pain is reported along with suffering. Suffering refers to mental pain, or more often yet it refers to any unpleasant feeling, emotion or aversion associated with the perception of harm or threat of harm. It is the psychological component of pain.  Pain Specialists prefer to separate the two components. The pain scale used by this practice is the Verbal Numerical Rating Scale (VNRS-11). This scale is for the physical pain only. DO NOT INCLUDE how your pain psychologically affects you. This scale is for adults 21 years of age and older. It has 11 (eleven) levels. The 1st level is 0/10. This means: "right now, I have no pain". In the context of pain management, it also means: "right now, my physical pain is under control with the current therapy".  General Information:  The scale should reflect your current level of pain. Unless you are specifically asked for the level of your worst pain, or your average pain. If you are asked for one of these two, then it should be understood that it is over the past 24 hours.  Levels 1 (one) through 5 (five) are described below, and can be treated as an outpatient. Ambulatory pain management facilities such as ours are more than adequate to treat these levels. Levels 6 (six) through 10 (ten) are also described below, however, these must be treated as a hospitalized patient. While levels 6 (six) and 7 (seven) may be evaluated at an urgent care facility, levels 8  (eight) through 10 (ten) constitute medical emergencies and as such, they belong in a hospital's emergency department. When having these levels (as described below), do not come to our office. Our facility is not equipped to manage these levels. Go directly to an urgent care facility or an emergency department to be evaluated.  Definitions:  Activities of Daily Living (ADL): Activities of daily living (ADL or ADLs) is a term used in healthcare to refer to people's daily self-care activities. Health professionals often use a person's ability or inability to perform ADLs as a measurement of their functional status, particularly in regard to people post injury, with disabilities and the elderly. There are two ADL levels: Basic and Instrumental. Basic Activities of Daily Living (BADL  or BADLs) consist of self-care tasks that include: Bathing and showering; personal hygiene and grooming (including brushing/combing/styling hair); dressing; Toilet hygiene (getting to the toilet, cleaning oneself, and getting back up); eating and self-feeding (not including cooking or chewing and swallowing); functional mobility, often referred to as "transferring", as measured by the ability to walk, get in and out of bed, and get into and out of a chair; the broader definition (moving from one place to another while performing activities) is useful for people with different physical abilities who are still able to get around independently. Basic ADLs include the things many people do when they get up in the morning and get ready to go out of the house: get out of bed, go to the toilet, bathe, dress, groom, and eat. On the average, loss of function typically follows a particular order.   Hygiene is the first to go, followed by loss of toilet use and locomotion. The last to go is the ability to eat. When there is only one remaining area in which the person is independent, there is a 62.9% chance that it is eating and only a 3.5% chance  that it is hygiene. Instrumental Activities of Daily Living (IADL or IADLs) are not necessary for fundamental functioning, but they let an individual live independently in a community. IADL consist of tasks that include: cleaning and maintaining the house; home establishment and maintenance; care of others (including selecting and supervising caregivers); care of pets; child rearing; managing money; managing financials (investments, etc.); meal preparation and cleanup; shopping for groceries and necessities; moving within the community; safety procedures and emergency responses; health management and maintenance (taking prescribed medications); and using the telephone or other form of communication.  Instructions:  Most patients tend to report their pain as a combination of two factors, their physical pain and their psychosocial pain. This last one is also known as "suffering" and it is reflection of how physical pain affects you socially and psychologically. From now on, report them separately.  From this point on, when asked to report your pain level, report only your physical pain. Use the following table for reference.  Pain Clinic Pain Levels (0-5/10)  Pain Level Score  Description  No Pain 0   Mild pain 1 Nagging, annoying, but does not interfere with basic activities of daily living (ADL). Patients are able to eat, bathe, get dressed, toileting (being able to get on and off the toilet and perform personal hygiene functions), transfer (move in and out of bed or a chair without assistance), and maintain continence (able to control bladder and bowel functions). Blood pressure and heart rate are unaffected. A normal heart rate for a healthy adult ranges from 60 to 100 bpm (beats per minute).   Mild to moderate pain 2 Noticeable and distracting. Impossible to hide from other people. More frequent flare-ups. Still possible to adapt and function close to normal. It can be very annoying and may have  occasional stronger flare-ups. With discipline, patients may get used to it and adapt.   Moderate pain 3 Interferes significantly with activities of daily living (ADL). It becomes difficult to feed, bathe, get dressed, get on and off the toilet or to perform personal hygiene functions. Difficult to get in and out of bed or a chair without assistance. Very distracting. With effort, it can be ignored when deeply involved in activities.   Moderately severe pain 4 Impossible to ignore for more than a few minutes. With effort, patients may still be able to manage work or participate in some social activities. Very difficult to concentrate. Signs of autonomic nervous system discharge are evident: dilated pupils (mydriasis); mild sweating (diaphoresis); sleep interference. Heart rate becomes elevated (>115 bpm). Diastolic blood pressure (lower number) rises above 100 mmHg. Patients find relief in laying down and not moving.   Severe pain 5 Intense and extremely unpleasant. Associated with frowning face and frequent crying. Pain overwhelms the senses.  Ability to do any activity or maintain social relationships becomes significantly limited. Conversation becomes difficult. Pacing back and forth is common, as getting into a comfortable position is nearly impossible. Pain wakes you up from deep sleep. Physical signs will be obvious: pupillary dilation; increased sweating; goosebumps; brisk reflexes; cold, clammy hands and feet; nausea, vomiting or dry heaves; loss of appetite; significant sleep disturbance with inability to fall asleep or to   remain asleep. When persistent, significant weight loss is observed due to the complete loss of appetite and sleep deprivation.  Blood pressure and heart rate becomes significantly elevated. Caution: If elevated blood pressure triggers a pounding headache associated with blurred vision, then the patient should immediately seek attention at an urgent or emergency care unit, as  these may be signs of an impending stroke.    Emergency Department Pain Levels (6-10/10)  Emergency Room Pain 6 Severely limiting. Requires emergency care and should not be seen or managed at an outpatient pain management facility. Communication becomes difficult and requires great effort. Assistance to reach the emergency department may be required. Facial flushing and profuse sweating along with potentially dangerous increases in heart rate and blood pressure will be evident.   Distressing pain 7 Self-care is very difficult. Assistance is required to transport, or use restroom. Assistance to reach the emergency department will be required. Tasks requiring coordination, such as bathing and getting dressed become very difficult.   Disabling pain 8 Self-care is no longer possible. At this level, pain is disabling. The individual is unable to do even the most "basic" activities such as walking, eating, bathing, dressing, transferring to a bed, or toileting. Fine motor skills are lost. It is difficult to think clearly.   Incapacitating pain 9 Pain becomes incapacitating. Thought processing is no longer possible. Difficult to remember your own name. Control of movement and coordination are lost.   The worst pain imaginable 10 At this level, most patients pass out from pain. When this level is reached, collapse of the autonomic nervous system occurs, leading to a sudden drop in blood pressure and heart rate. This in turn results in a temporary and dramatic drop in blood flow to the brain, leading to a loss of consciousness. Fainting is one of the body's self defense mechanisms. Passing out puts the brain in a calmed state and causes it to shut down for a while, in order to begin the healing process.    Summary: 1.   Refer to this scale when providing us with your pain level. 2.   Be accurate and careful when reporting your pain level. This will help with your care. 3.   Over-reporting your pain level  will lead to loss of credibility. 4.   Even a level of 1/10 means that there is pain and will be treated at our facility. 5.   High, inaccurate reporting will be documented as "Symptom Exaggeration", leading to loss of credibility and suspicions of possible secondary gains such as obtaining more narcotics, or wanting to appear disabled, for fraudulent reasons. 6.   Only pain levels of 5 or below will be seen at our facility. 7.   Pain levels of 6 and above will be sent to the Emergency Department and the appointment cancelled.  ______________________________________________________________________________________________     

## 2018-08-10 NOTE — Progress Notes (Signed)
Nursing Pain Medication Assessment:  Safety precautions to be maintained throughout the outpatient stay will include: orient to surroundings, keep bed in low position, maintain call bell within reach at all times, provide assistance with transfer out of bed and ambulation.  Medication Inspection Compliance: Pill count conducted under aseptic conditions, in front of the patient. Neither the pills nor the bottle was removed from the patient's sight at any time. Once count was completed pills were immediately returned to the patient in their original bottle.  Medication: Oxycodone IR Pill/Patch Count: 20 of 28 pills remain Pill/Patch Appearance: Markings consistent with prescribed medication Bottle Appearance: Standard pharmacy container. Clearly labeled. Filled Date: 01 / 06 / 2019 Last Medication intake:  Today  Given by Dr Lucky Cowboy post surgery for right stump debridement

## 2018-08-11 ENCOUNTER — Encounter: Payer: Self-pay | Admitting: Family Medicine

## 2018-08-11 ENCOUNTER — Ambulatory Visit (INDEPENDENT_AMBULATORY_CARE_PROVIDER_SITE_OTHER): Payer: Medicare HMO | Admitting: Family Medicine

## 2018-08-11 VITALS — BP 112/70 | HR 108 | Temp 98.0°F

## 2018-08-11 DIAGNOSIS — Z89511 Acquired absence of right leg below knee: Secondary | ICD-10-CM | POA: Diagnosis not present

## 2018-08-11 DIAGNOSIS — I96 Gangrene, not elsewhere classified: Secondary | ICD-10-CM | POA: Diagnosis not present

## 2018-08-11 DIAGNOSIS — T8789 Other complications of amputation stump: Secondary | ICD-10-CM | POA: Diagnosis not present

## 2018-08-11 DIAGNOSIS — T8740 Infection of amputation stump, unspecified extremity: Secondary | ICD-10-CM

## 2018-08-11 DIAGNOSIS — Z89512 Acquired absence of left leg below knee: Secondary | ICD-10-CM | POA: Diagnosis not present

## 2018-08-11 DIAGNOSIS — M79609 Pain in unspecified limb: Secondary | ICD-10-CM

## 2018-08-11 DIAGNOSIS — R69 Illness, unspecified: Secondary | ICD-10-CM | POA: Diagnosis not present

## 2018-08-11 DIAGNOSIS — G546 Phantom limb syndrome with pain: Secondary | ICD-10-CM

## 2018-08-11 DIAGNOSIS — F4321 Adjustment disorder with depressed mood: Secondary | ICD-10-CM

## 2018-08-11 MED ORDER — NYSTATIN 100000 UNIT/GM EX POWD: Freq: Four times a day (QID) | CUTANEOUS | 0 refills | Status: DC

## 2018-08-11 NOTE — Progress Notes (Signed)
BP 112/70 (BP Location: Left Arm, Patient Position: Sitting, Cuff Size: Normal)   Pulse (!) 108   Temp 98 F (36.7 C) (Oral)   SpO2 98%    Subjective:    Patient ID: [name]., male    DOB: [birth year], 63 y.o.   MRN: [id number]  HPI: [name]. is a 63 y.o. male  Chief Complaint  Patient presents with  . Phantom Pain    Follow-up  . S/P Bilateral BKA  . Depression   Not hurting in the area below the leg, just in the stump. Had debridement 3 days ago. Has been doing better. Notes that the gabapentin has really been helping his leg. Notes that it really seems to be helping with the phantom pain. He is otherwise doing well. Still does not want any medication for his mood. Does not want to do his diabetes visit today. No other concerns or complaints at this time.   Relevant past medical, surgical, family and social history reviewed and updated as indicated. Interim medical history since our last visit reviewed. Allergies and medications reviewed and updated.  Review of Systems  Constitutional: Negative.   Respiratory: Negative.   Cardiovascular: Negative.   Musculoskeletal: Negative.   Neurological: Negative.   Psychiatric/Behavioral: Negative.     Per HPI unless specifically indicated above     Objective:    BP 112/70 (BP Location: Left Arm, Patient Position: Sitting, Cuff Size: Normal)   Pulse (!) 108   Temp 98 F (36.7 C) (Oral)   SpO2 98%   Wt Readings from Last 3 Encounters:  08/10/18 180 lb (81.6 kg)  08/08/18 178 lb 2.1 oz (80.8 kg)  08/01/18 180 lb (81.6 kg)    Physical Exam Vitals signs and nursing note reviewed.  Constitutional:      General: He is not in acute distress.    Appearance: Normal appearance. He is not ill-appearing, toxic-appearing or diaphoretic.  HENT:     Head: Normocephalic and atraumatic.     Right Ear: External ear normal.     Left Ear: External ear normal.     Nose: Nose normal.     Mouth/Throat:     Mouth:  Mucous membranes are moist.     Pharynx: Oropharynx is clear.  Eyes:     General: No scleral icterus.       Right eye: No discharge.        Left eye: No discharge.     Extraocular Movements: Extraocular movements intact.     Conjunctiva/sclera: Conjunctivae normal.     Pupils: Pupils are equal, round, and reactive to light.  Neck:     Musculoskeletal: Normal range of motion and neck supple.  Cardiovascular:     Rate and Rhythm: Normal rate and regular rhythm.     Pulses: Normal pulses.     Heart sounds: Normal heart sounds. No murmur. No friction rub. No gallop.   Pulmonary:     Effort: Pulmonary effort is normal. No respiratory distress.     Breath sounds: Normal breath sounds. No stridor. No wheezing, rhonchi or rales.  Chest:     Chest wall: No tenderness.  Musculoskeletal: Normal range of motion.     Comments: R below the knee amputation with wound vac on and dressings- dressings not removed today.  Skin:    General: Skin is warm and dry.     Capillary Refill: Capillary refill takes less than 2 seconds.     Coloration: Skin  is not jaundiced or pale.     Findings: No bruising, erythema, lesion or rash.  Neurological:     General: No focal deficit present.     Mental Status: He is alert and oriented to person, place, and time. Mental status is at baseline.  Psychiatric:        Mood and Affect: Mood normal.        Behavior: Behavior normal.        Thought Content: Thought content normal.        Judgment: Judgment normal.     Results for orders placed or performed during the hospital encounter of 08/08/18  Glucose, capillary  Result Value Ref Range   Glucose-Capillary 171 (H) 70 - 99 mg/dL  Glucose, capillary  Result Value Ref Range   Glucose-Capillary 175 (H) 70 - 99 mg/dL      Assessment & Plan:   Problem List Items Addressed This Visit      Other   S/P bilateral BKA (below knee amputation) ([place]) - Primary (Chronic)    Just had debridement. Pain under good  control with gabapentin. Continue gabapentin. Refill given today. Continue to follow with vascular. Call with any concerns.       Stump pain ([name]) (Right) (Chronic)    Just had debridement. Pain under good control with gabapentin. Continue gabapentin. Refill given today. Continue to follow with vascular. Call with any concerns.       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 Call with any concerns. Continue to monitor.       Relevant Medications   nystatin (MYCOSTATIN/NYSTOP) powder   Phantom pain ([name])    Doing much better with the gabapentin. Continue gabapentin. Continue to monitor. Refill given today. Call with any concerns.        Other Visit Diagnoses    Adjustment disorder with depressed mood       Still not interested in medication. Continue to monitor. Call with any concerns.        Follow up plan: Return in about 4 weeks (around 09/08/2018) for Diabetes follow up.

## 2018-08-12 ENCOUNTER — Ambulatory Visit
Admission: RE | Admit: 2018-08-12 | Discharge: 2018-08-12 | Disposition: A | Discharge status: Home / Self Care | Payer: Medicare HMO | Source: Ambulatory Visit | Attending: Family Medicine | Admitting: Family Medicine

## 2018-08-12 ENCOUNTER — Telehealth (INDEPENDENT_AMBULATORY_CARE_PROVIDER_SITE_OTHER): Payer: Self-pay

## 2018-08-12 DIAGNOSIS — R911 Solitary pulmonary nodule: Secondary | ICD-10-CM | POA: Diagnosis not present

## 2018-08-12 DIAGNOSIS — E1152 Type 2 diabetes mellitus with diabetic peripheral angiopathy with gangrene: Secondary | ICD-10-CM | POA: Diagnosis not present

## 2018-08-12 DIAGNOSIS — I480 Paroxysmal atrial fibrillation: Secondary | ICD-10-CM | POA: Diagnosis not present

## 2018-08-12 DIAGNOSIS — E785 Hyperlipidemia, unspecified: Secondary | ICD-10-CM | POA: Diagnosis not present

## 2018-08-12 DIAGNOSIS — R918 Other nonspecific abnormal finding of lung field: Secondary | ICD-10-CM

## 2018-08-12 DIAGNOSIS — Z89511 Acquired absence of right leg below knee: Secondary | ICD-10-CM | POA: Diagnosis not present

## 2018-08-12 DIAGNOSIS — D649 Anemia, unspecified: Secondary | ICD-10-CM | POA: Diagnosis not present

## 2018-08-12 DIAGNOSIS — Z4781 Encounter for orthopedic aftercare following surgical amputation: Secondary | ICD-10-CM | POA: Diagnosis not present

## 2018-08-12 DIAGNOSIS — I10 Essential (primary) hypertension: Secondary | ICD-10-CM | POA: Diagnosis not present

## 2018-08-12 DIAGNOSIS — I77810 Thoracic aortic ectasia: Secondary | ICD-10-CM | POA: Diagnosis not present

## 2018-08-12 DIAGNOSIS — I69351 Hemiplegia and hemiparesis following cerebral infarction affecting right dominant side: Secondary | ICD-10-CM | POA: Diagnosis not present

## 2018-08-12 IMAGING — CT CT CHEST W/O CM
2 of 3 series · 15 of 36 positions shown, 18 images · non-contrast
Comparison: 04/18/2018

CLINICAL DATA: Follow-up pulmonary nodule

EXAM:
CT CHEST WITHOUT CONTRAST
TECHNIQUE: Multidetector CT imaging of the chest was performed following the
standard protocol without IV contrast.

[Series 3: thorax · axial · 0.75mm/px · z∈[-306,-6]mm · 12 of 176 slices shown, 15 images]
[im 13/176  mediastinal]
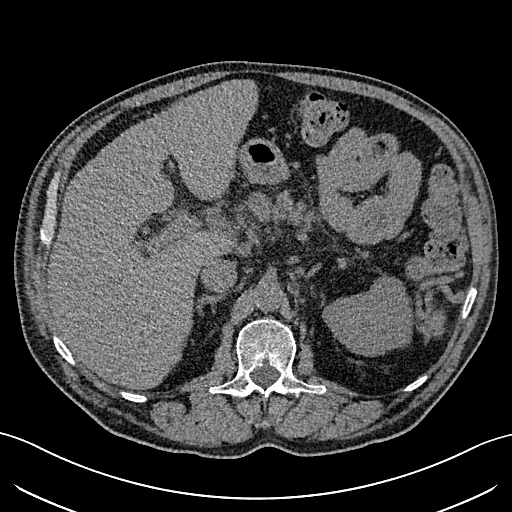
[im 13/176  lung]
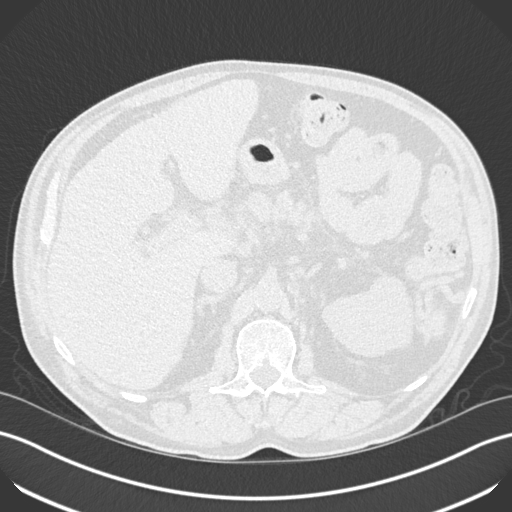
[im 26/176  lung]
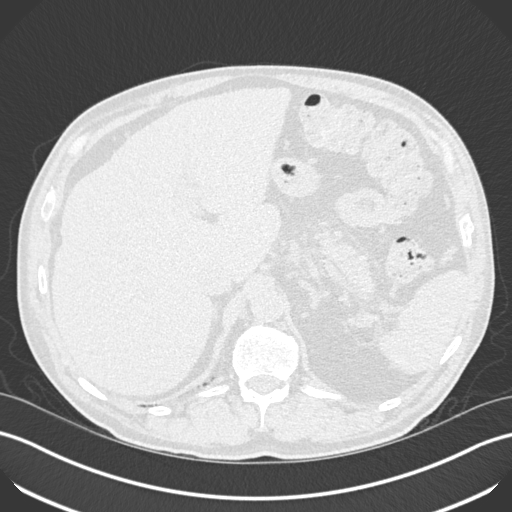
[im 39/176  lung]
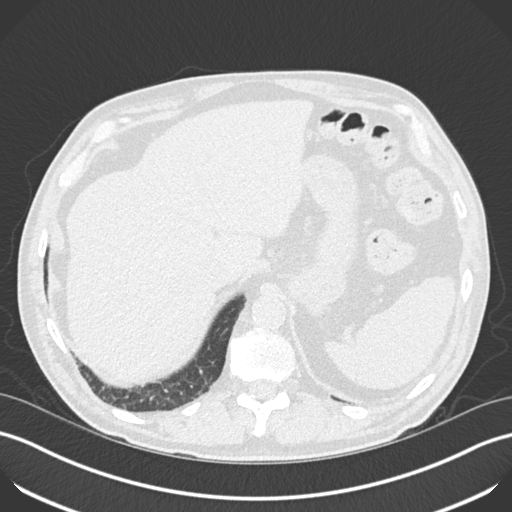
[im 52/176  lung]
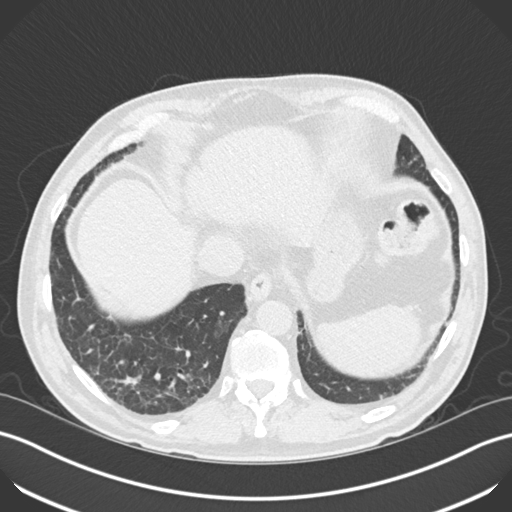
[im 65/176  mediastinal]
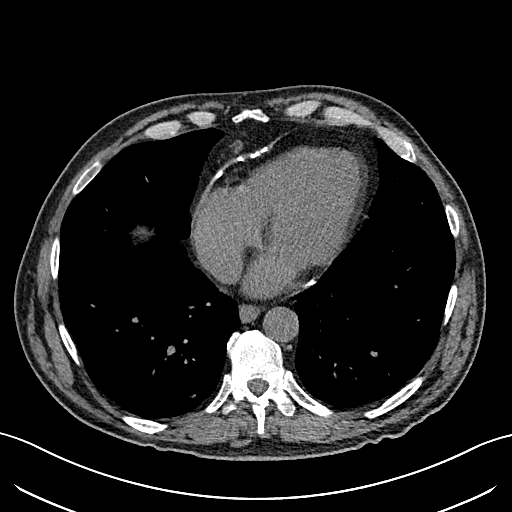
[im 65/176  lung]
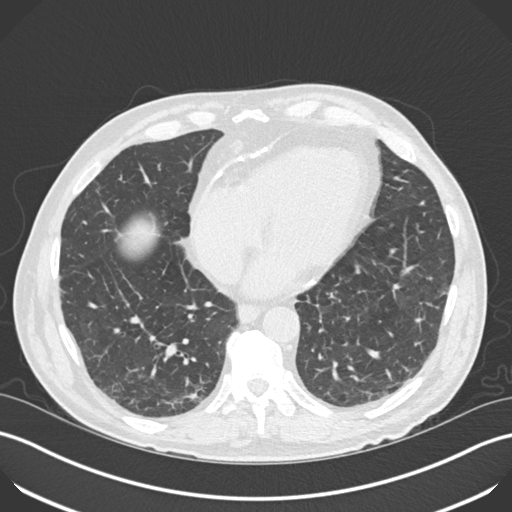
[im 78/176  lung]
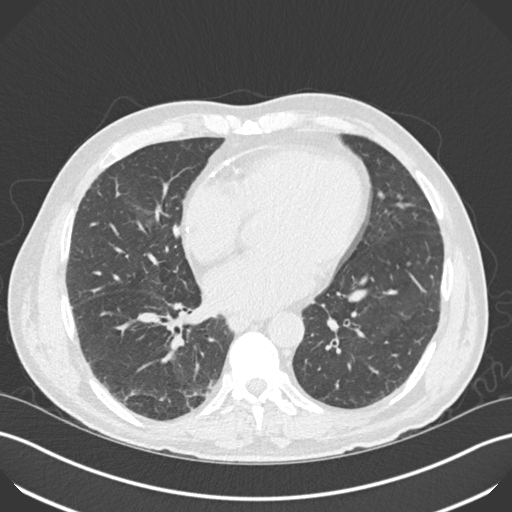
[im 98/176  lung]
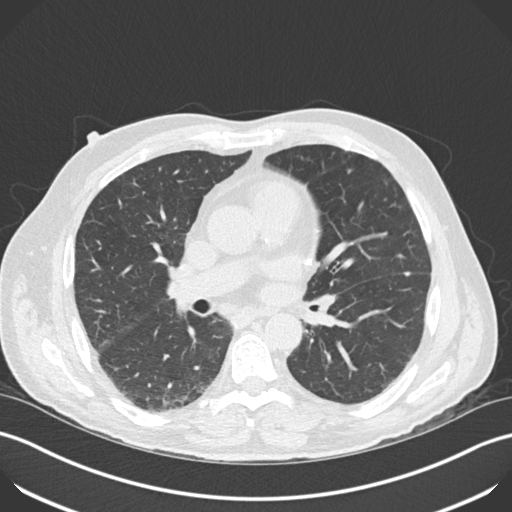
[im 111/176  lung]
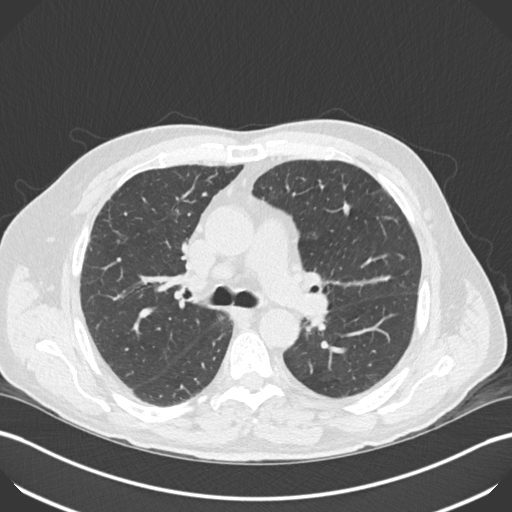
[im 124/176  mediastinal]
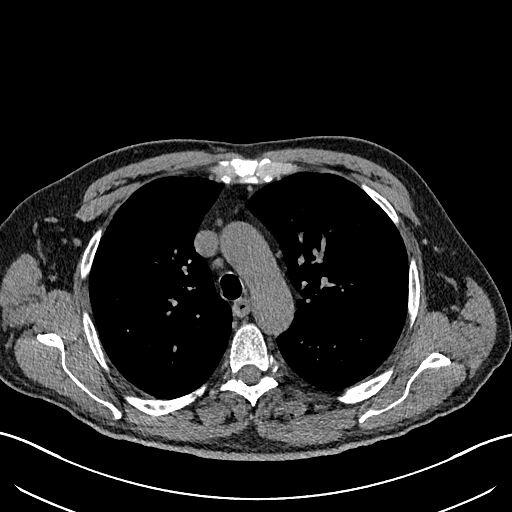
[im 124/176  lung]
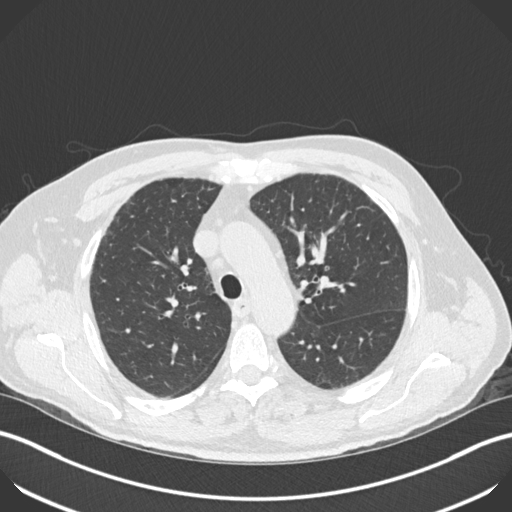
[im 137/176  lung]
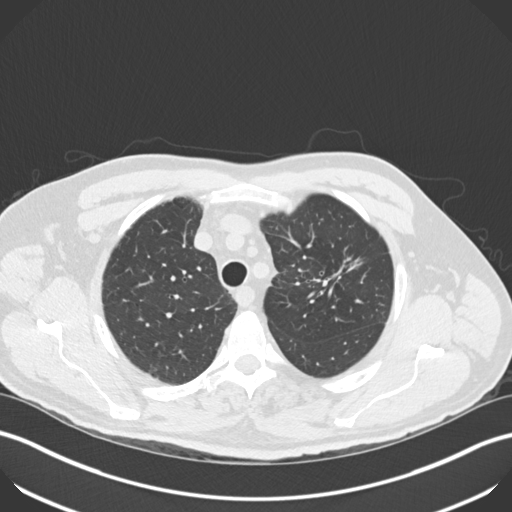
[im 150/176  lung]
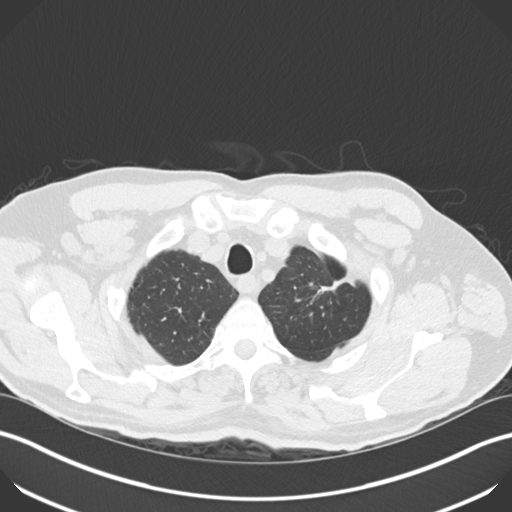
[im 163/176  lung]
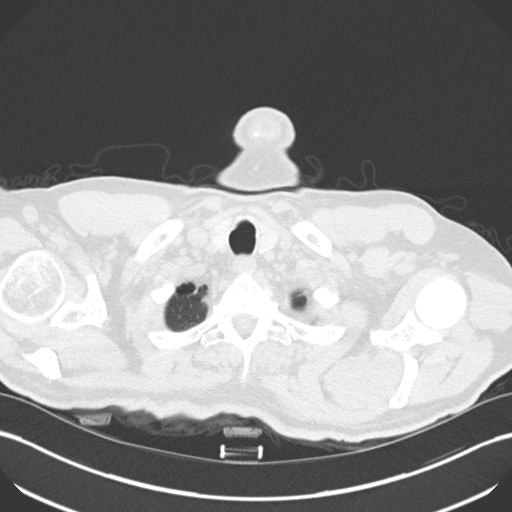

[Series 6: coronal · coronal · 0.71mm/px · 3 of 152 slices shown]
[im 31/152  lung]
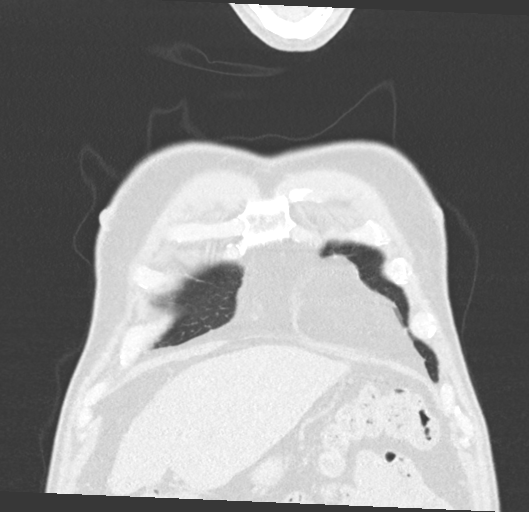
[im 61/152  lung]
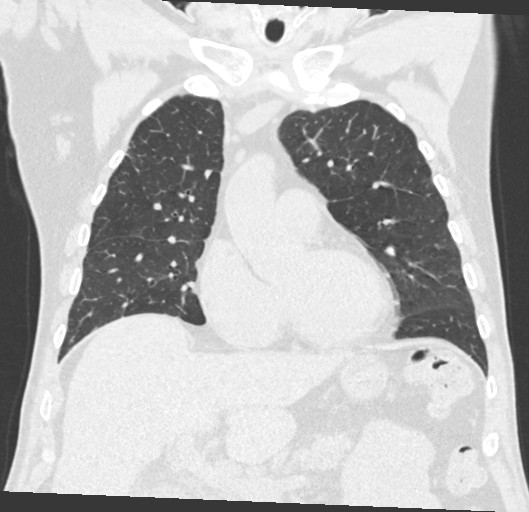
[im 91/152  lung]
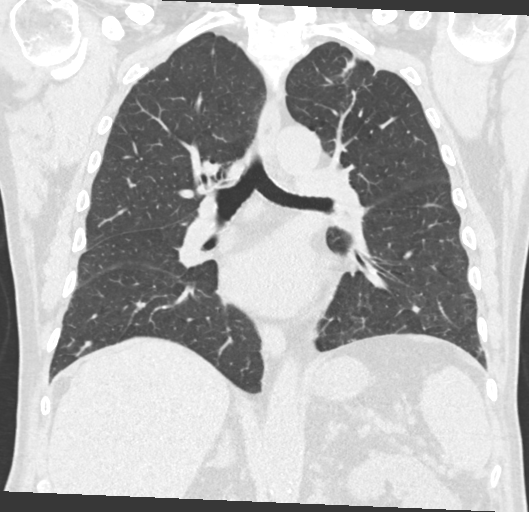

[15 of 36 positions shown; findings below may reference images not displayed]

FINDINGS: Cardiovascular: Atherosclerotic calcifications of the thoracic aorta
are noted. No aneurysmal dilatation is seen. The enlargement of the
sinus of Valsalva is less well appreciated on this noncontrast
examination. Pericardial calcifications are again noted. Cardiac
size is stable. Mild coronary calcifications are seen.

Mediastinum/Nodes: Thoracic inlet is within normal limits. Scattered
small mediastinal lymph nodes are noted although not significant by
size criteria. The overall appearance is stable. No hilar adenopathy
is noted. The esophagus is within normal limits.

Lungs/Pleura: Scarring is noted in the left lung apex with some
associated nodular change best seen on image number 37 of series 4.
The overall appearance is similar to that seen on the prior exam.
Scattered small less than 5 mm nodules are noted also stable from
the prior exam. Mild emphysematous changes are noted. No new nodular
changes are seen. No focal infiltrate or effusion is noted.

Upper Abdomen: Cholelithiasis is noted. The remainder of the upper
abdomen is within normal limits.

Musculoskeletal: Degenerative changes of the thoracic spine are
seen. No acute bony abnormality is noted.
IMPRESSION: Stable nodular changes in both lungs most prominent in the left
upper lobe. Given the stability on follow-up exam, a follow-up
noncontrast CT in 14-20 months from today's scan is considered
optional for low risk patients but recommended for high-risk
patients. This recommendation follows the consensus statement:
Guidelines for Management of Incidental Pulmonary Nodules Detected
[DATE].

Stable cholelithiasis.

Prominence at the level of the sinus of Valsalva is not well
appreciated due to motion artifact in the lack of IV contrast.

Aortic Atherosclerosis (EJ9TA-1RW.W) and Emphysema (EJ9TA-Z7A.V).

## 2018-08-12 NOTE — Telephone Encounter (Signed)
Sarah from [facility] called and left a message on the triage line and stated that the patient is not tolerating the wound vac well at all. It was changed on Wednesday and Friday. Even with taking the pain medication he is still having a very hard time. They would like to know if you would send a higher dose of pain medication or maybe xanax for the patient.   Call back number is [phone]

## 2018-08-14 ENCOUNTER — Encounter: Payer: Self-pay | Admitting: Family Medicine

## 2018-08-14 DIAGNOSIS — G546 Phantom limb syndrome with pain: Secondary | ICD-10-CM | POA: Insufficient documentation

## 2018-08-14 NOTE — Assessment & Plan Note (Signed)
Doing much better with the gabapentin. Continue gabapentin. Continue to monitor. Refill given today. Call with any concerns.

## 2018-08-14 NOTE — Assessment & Plan Note (Signed)
Just had debridement. Pain under good control with gabapentin. Continue gabapentin. Refill given today. Continue to follow with vascular. Call with any concerns.

## 2018-08-14 NOTE — Assessment & Plan Note (Signed)
Following with vascular surgery. Call with any concerns. Continue to monitor.

## 2018-08-15 ENCOUNTER — Telehealth: Payer: Self-pay | Admitting: Family Medicine

## 2018-08-15 ENCOUNTER — Other Ambulatory Visit (INDEPENDENT_AMBULATORY_CARE_PROVIDER_SITE_OTHER): Payer: Self-pay | Admitting: Nurse Practitioner

## 2018-08-15 DIAGNOSIS — R911 Solitary pulmonary nodule: Secondary | ICD-10-CM | POA: Insufficient documentation

## 2018-08-15 MED ORDER — OXYCODONE-ACETAMINOPHEN 5-325 MG PO TABS: 1.0000 | ORAL_TABLET | ORAL | 0 refills | Status: DC

## 2018-08-15 NOTE — Telephone Encounter (Signed)
The pain clinic per patient discharged him and stated that they could not treat him since its acute pain not chronic.

## 2018-08-15 NOTE — Telephone Encounter (Signed)
Please let [name] and his wife know that his CT scan was stable. He just needs a follow up CT in about 1.5 years. Nothing to worry about right now. Thanks!

## 2018-08-15 NOTE — Telephone Encounter (Signed)
Pt called back/ advised Pt of note/ please call Pt and advise

## 2018-08-15 NOTE — Telephone Encounter (Signed)
Spoke with [name] patients wife,  which per DPR in chart is allowed by patient. She stated that the pharmacy is  [id number] - [place], [address] [place] [address] Phone: [phone] Fax: [phone]  She is aware about the referral back to pain management for what is listed as chronic pain. She states that she does not feel like the oxycodone only for the debridements will help him because he was in constant pain. The pain is from healing so that is why he was discharged from the pain clinic. I told the wife I would make the providers aware but that I was unsure that they could prescribe him more medication.

## 2018-08-15 NOTE — Telephone Encounter (Signed)
Please see below in regards to a referral to the pain clinic.

## 2018-08-15 NOTE — Telephone Encounter (Signed)
-----   Message from Volney American, [place] sent at 08/12/2018  1:29 PM EST ----- Regarding: Results  ----- Message ----- From: Interface, Rad Results In Sent: 08/12/2018   1:26 PM EST To: Volney American, PA-C

## 2018-08-15 NOTE — Telephone Encounter (Signed)
Patient is actually a current patient with the Pain clinic.  If we would write him more pain medicine he would actually break his pain contract with the pain management clinic.  He should contact them if his pain is not under good control.

## 2018-08-15 NOTE — Telephone Encounter (Signed)
[id number] - [place], [place] - [address]  [address] [place] [address]  Phone: [phone] Fax: [phone]    

## 2018-08-15 NOTE — Telephone Encounter (Signed)
Find out his pharmacy, after speaking with Dr. Lucky Cowboy, we will provide him with pain medication for dressing changes of his wound vac

## 2018-08-15 NOTE — Telephone Encounter (Signed)
Called patient. No answer. Left VM for pt to return call to the office.

## 2018-08-15 NOTE — Telephone Encounter (Signed)
I have a sent in a RX for pain medicine, we can assess how well it is doing after his follow up visit.

## 2018-08-15 NOTE — Telephone Encounter (Signed)
Wife is aware that prescription is sent to requested pharmacy

## 2018-08-16 ENCOUNTER — Ambulatory Visit: Payer: Medicare HMO | Admitting: Cardiovascular Disease

## 2018-08-16 NOTE — Telephone Encounter (Signed)
Tried to call patient, no answer. Left VM for pt to call back to the office.

## 2018-08-17 ENCOUNTER — Telehealth (INDEPENDENT_AMBULATORY_CARE_PROVIDER_SITE_OTHER): Payer: Self-pay | Admitting: Vascular Surgery

## 2018-08-17 DIAGNOSIS — D649 Anemia, unspecified: Secondary | ICD-10-CM | POA: Diagnosis not present

## 2018-08-17 DIAGNOSIS — Z4781 Encounter for orthopedic aftercare following surgical amputation: Secondary | ICD-10-CM | POA: Diagnosis not present

## 2018-08-17 DIAGNOSIS — I480 Paroxysmal atrial fibrillation: Secondary | ICD-10-CM | POA: Diagnosis not present

## 2018-08-17 DIAGNOSIS — I77810 Thoracic aortic ectasia: Secondary | ICD-10-CM | POA: Diagnosis not present

## 2018-08-17 DIAGNOSIS — I10 Essential (primary) hypertension: Secondary | ICD-10-CM | POA: Diagnosis not present

## 2018-08-17 DIAGNOSIS — I69351 Hemiplegia and hemiparesis following cerebral infarction affecting right dominant side: Secondary | ICD-10-CM | POA: Diagnosis not present

## 2018-08-17 DIAGNOSIS — R911 Solitary pulmonary nodule: Secondary | ICD-10-CM | POA: Diagnosis not present

## 2018-08-17 DIAGNOSIS — Z89511 Acquired absence of right leg below knee: Secondary | ICD-10-CM | POA: Diagnosis not present

## 2018-08-17 DIAGNOSIS — E1152 Type 2 diabetes mellitus with diabetic peripheral angiopathy with gangrene: Secondary | ICD-10-CM | POA: Diagnosis not present

## 2018-08-17 DIAGNOSIS — E785 Hyperlipidemia, unspecified: Secondary | ICD-10-CM | POA: Diagnosis not present

## 2018-08-17 NOTE — Telephone Encounter (Signed)
Pt's wife left vm asking if we would redress wound vak at pt's next appt on the 20th or will Judson Roch have to do it. Spoke with Maudie Mercury in office who states that we will see pt and undress wound and then they will re-wrap but then Judson Roch will need to do wound vak re-wrap. Spoke with pt's wife and she understood and had no additional questions.  Nothing further is needed.

## 2018-08-17 NOTE — Telephone Encounter (Signed)
Patient notified of test results 

## 2018-08-18 DIAGNOSIS — R911 Solitary pulmonary nodule: Secondary | ICD-10-CM | POA: Diagnosis not present

## 2018-08-18 DIAGNOSIS — D649 Anemia, unspecified: Secondary | ICD-10-CM | POA: Diagnosis not present

## 2018-08-18 DIAGNOSIS — I10 Essential (primary) hypertension: Secondary | ICD-10-CM | POA: Diagnosis not present

## 2018-08-18 DIAGNOSIS — E785 Hyperlipidemia, unspecified: Secondary | ICD-10-CM | POA: Diagnosis not present

## 2018-08-18 DIAGNOSIS — Z4781 Encounter for orthopedic aftercare following surgical amputation: Secondary | ICD-10-CM | POA: Diagnosis not present

## 2018-08-18 DIAGNOSIS — I69351 Hemiplegia and hemiparesis following cerebral infarction affecting right dominant side: Secondary | ICD-10-CM | POA: Diagnosis not present

## 2018-08-18 DIAGNOSIS — E1152 Type 2 diabetes mellitus with diabetic peripheral angiopathy with gangrene: Secondary | ICD-10-CM | POA: Diagnosis not present

## 2018-08-18 DIAGNOSIS — I77810 Thoracic aortic ectasia: Secondary | ICD-10-CM | POA: Diagnosis not present

## 2018-08-18 DIAGNOSIS — Z89511 Acquired absence of right leg below knee: Secondary | ICD-10-CM | POA: Diagnosis not present

## 2018-08-18 DIAGNOSIS — I480 Paroxysmal atrial fibrillation: Secondary | ICD-10-CM | POA: Diagnosis not present

## 2018-08-19 DIAGNOSIS — E1152 Type 2 diabetes mellitus with diabetic peripheral angiopathy with gangrene: Secondary | ICD-10-CM | POA: Diagnosis not present

## 2018-08-19 DIAGNOSIS — Z4781 Encounter for orthopedic aftercare following surgical amputation: Secondary | ICD-10-CM | POA: Diagnosis not present

## 2018-08-19 DIAGNOSIS — I10 Essential (primary) hypertension: Secondary | ICD-10-CM | POA: Diagnosis not present

## 2018-08-19 DIAGNOSIS — E785 Hyperlipidemia, unspecified: Secondary | ICD-10-CM | POA: Diagnosis not present

## 2018-08-19 DIAGNOSIS — Z89511 Acquired absence of right leg below knee: Secondary | ICD-10-CM | POA: Diagnosis not present

## 2018-08-19 DIAGNOSIS — I77810 Thoracic aortic ectasia: Secondary | ICD-10-CM | POA: Diagnosis not present

## 2018-08-19 DIAGNOSIS — I69351 Hemiplegia and hemiparesis following cerebral infarction affecting right dominant side: Secondary | ICD-10-CM | POA: Diagnosis not present

## 2018-08-19 DIAGNOSIS — D649 Anemia, unspecified: Secondary | ICD-10-CM | POA: Diagnosis not present

## 2018-08-19 DIAGNOSIS — R911 Solitary pulmonary nodule: Secondary | ICD-10-CM | POA: Diagnosis not present

## 2018-08-19 DIAGNOSIS — I480 Paroxysmal atrial fibrillation: Secondary | ICD-10-CM | POA: Diagnosis not present

## 2018-08-22 ENCOUNTER — Ambulatory Visit (INDEPENDENT_AMBULATORY_CARE_PROVIDER_SITE_OTHER): Payer: Medicare HMO | Admitting: Nurse Practitioner

## 2018-08-22 ENCOUNTER — Encounter (INDEPENDENT_AMBULATORY_CARE_PROVIDER_SITE_OTHER): Payer: Self-pay | Admitting: Nurse Practitioner

## 2018-08-22 ENCOUNTER — Other Ambulatory Visit: Payer: Self-pay

## 2018-08-22 VITALS — BP 115/72 | HR 80 | Resp 16 | Ht 70.0 in | Wt 180.0 lb

## 2018-08-22 DIAGNOSIS — I480 Paroxysmal atrial fibrillation: Secondary | ICD-10-CM | POA: Diagnosis not present

## 2018-08-22 DIAGNOSIS — Z89511 Acquired absence of right leg below knee: Secondary | ICD-10-CM | POA: Diagnosis not present

## 2018-08-22 DIAGNOSIS — E1151 Type 2 diabetes mellitus with diabetic peripheral angiopathy without gangrene: Secondary | ICD-10-CM

## 2018-08-22 DIAGNOSIS — I69351 Hemiplegia and hemiparesis following cerebral infarction affecting right dominant side: Secondary | ICD-10-CM | POA: Diagnosis not present

## 2018-08-22 DIAGNOSIS — D649 Anemia, unspecified: Secondary | ICD-10-CM | POA: Diagnosis not present

## 2018-08-22 DIAGNOSIS — R911 Solitary pulmonary nodule: Secondary | ICD-10-CM | POA: Diagnosis not present

## 2018-08-22 DIAGNOSIS — T879 Unspecified complications of amputation stump: Secondary | ICD-10-CM

## 2018-08-22 DIAGNOSIS — I10 Essential (primary) hypertension: Secondary | ICD-10-CM | POA: Diagnosis not present

## 2018-08-22 DIAGNOSIS — I70209 Unspecified atherosclerosis of native arteries of extremities, unspecified extremity: Secondary | ICD-10-CM

## 2018-08-22 DIAGNOSIS — E782 Mixed hyperlipidemia: Secondary | ICD-10-CM

## 2018-08-22 DIAGNOSIS — Z4781 Encounter for orthopedic aftercare following surgical amputation: Secondary | ICD-10-CM | POA: Diagnosis not present

## 2018-08-22 DIAGNOSIS — E1152 Type 2 diabetes mellitus with diabetic peripheral angiopathy with gangrene: Secondary | ICD-10-CM | POA: Diagnosis not present

## 2018-08-22 DIAGNOSIS — I77810 Thoracic aortic ectasia: Secondary | ICD-10-CM | POA: Diagnosis not present

## 2018-08-22 DIAGNOSIS — E785 Hyperlipidemia, unspecified: Secondary | ICD-10-CM | POA: Diagnosis not present

## 2018-08-22 MED ORDER — TRAMADOL HCL 50 MG PO TABS: 50.0000 mg | ORAL_TABLET | Freq: Four times a day (QID) | ORAL | 0 refills | Status: AC | PRN

## 2018-08-24 DIAGNOSIS — E1152 Type 2 diabetes mellitus with diabetic peripheral angiopathy with gangrene: Secondary | ICD-10-CM | POA: Diagnosis not present

## 2018-08-24 DIAGNOSIS — D649 Anemia, unspecified: Secondary | ICD-10-CM | POA: Diagnosis not present

## 2018-08-24 DIAGNOSIS — E785 Hyperlipidemia, unspecified: Secondary | ICD-10-CM | POA: Diagnosis not present

## 2018-08-24 DIAGNOSIS — I77810 Thoracic aortic ectasia: Secondary | ICD-10-CM | POA: Diagnosis not present

## 2018-08-24 DIAGNOSIS — I480 Paroxysmal atrial fibrillation: Secondary | ICD-10-CM | POA: Diagnosis not present

## 2018-08-24 DIAGNOSIS — R911 Solitary pulmonary nodule: Secondary | ICD-10-CM | POA: Diagnosis not present

## 2018-08-24 DIAGNOSIS — Z89511 Acquired absence of right leg below knee: Secondary | ICD-10-CM | POA: Diagnosis not present

## 2018-08-24 DIAGNOSIS — I69351 Hemiplegia and hemiparesis following cerebral infarction affecting right dominant side: Secondary | ICD-10-CM | POA: Diagnosis not present

## 2018-08-24 DIAGNOSIS — I10 Essential (primary) hypertension: Secondary | ICD-10-CM | POA: Diagnosis not present

## 2018-08-24 DIAGNOSIS — Z4781 Encounter for orthopedic aftercare following surgical amputation: Secondary | ICD-10-CM | POA: Diagnosis not present

## 2018-08-25 ENCOUNTER — Telehealth (INDEPENDENT_AMBULATORY_CARE_PROVIDER_SITE_OTHER): Payer: Self-pay

## 2018-08-25 NOTE — Telephone Encounter (Signed)
Well consider [place] informed.

## 2018-08-25 NOTE — Telephone Encounter (Signed)
Amy with [facility] called and left a message on the triage line and stated that her visit with the patient was canceled today because his wife is sick. She did not want to reschedule for tomorrow since he would be having someone come for his wound vac and that is very painful and a lengthy visit for him. No call back needed she stated she just wanted [place] to be informed.

## 2018-08-26 ENCOUNTER — Encounter (INDEPENDENT_AMBULATORY_CARE_PROVIDER_SITE_OTHER): Payer: Self-pay | Admitting: Nurse Practitioner

## 2018-08-26 DIAGNOSIS — R911 Solitary pulmonary nodule: Secondary | ICD-10-CM | POA: Diagnosis not present

## 2018-08-26 DIAGNOSIS — D649 Anemia, unspecified: Secondary | ICD-10-CM | POA: Diagnosis not present

## 2018-08-26 DIAGNOSIS — E785 Hyperlipidemia, unspecified: Secondary | ICD-10-CM | POA: Diagnosis not present

## 2018-08-26 DIAGNOSIS — I69351 Hemiplegia and hemiparesis following cerebral infarction affecting right dominant side: Secondary | ICD-10-CM | POA: Diagnosis not present

## 2018-08-26 DIAGNOSIS — Z4781 Encounter for orthopedic aftercare following surgical amputation: Secondary | ICD-10-CM | POA: Diagnosis not present

## 2018-08-26 DIAGNOSIS — I480 Paroxysmal atrial fibrillation: Secondary | ICD-10-CM | POA: Diagnosis not present

## 2018-08-26 DIAGNOSIS — Z89511 Acquired absence of right leg below knee: Secondary | ICD-10-CM | POA: Diagnosis not present

## 2018-08-26 DIAGNOSIS — E1152 Type 2 diabetes mellitus with diabetic peripheral angiopathy with gangrene: Secondary | ICD-10-CM | POA: Diagnosis not present

## 2018-08-26 DIAGNOSIS — I10 Essential (primary) hypertension: Secondary | ICD-10-CM | POA: Diagnosis not present

## 2018-08-26 DIAGNOSIS — I77810 Thoracic aortic ectasia: Secondary | ICD-10-CM | POA: Diagnosis not present

## 2018-08-26 NOTE — Progress Notes (Signed)
Subjective:    Patient ID: Glenn Gerold., male    DOB: [birth year], 63 y.o.   MRN: [id number] Chief Complaint  Patient presents with  . Follow-up    [facility] wound check    HPI  [name]. is a 63 y.o. male that presents today for wound evaluation after wound debridement on 08/08/2018.  The patient endorses having extreme pain following the wound debridement.  This somewhat conflicts with other reports of having acceptable pain levels as documented by other providers.  The patient has had wound VAC changes by his home health agency.  The wound today appears healthy with red, beefy wound tissue.  No areas of slough or eschar present at this time.  There is bleeding in some areas.  The patient is concerned with how long the healing take.  The patient does have some memory difficulties following a previous stroke.  His wife is present with him today.  Patient denies any fever, chills, nausea, vomiting or diarrhea.  He denies any foul-smelling drainage from the area.  He denies any chest pain or shortness of breath.  Past Medical History:  Diagnosis Date  . Acute left PCA stroke ([place]) 02/17/2015  . Anemia   . Atherosclerosis of native arteries of the extremities with gangrene ([place]) 05/08/2018  . Atrial fibrillation ([place])   . Blind    right eye  . Diabetes mellitus with complication ([place])   . Dilated aortic root ([place])    a. 04/2018 Echo: 4.1cm. Asc Ao 3.5cm.  Marland Kitchen Dysrhythmia   . Gangrene of right foot ([place]) 06/02/2018  . GERD (gastroesophageal reflux disease)   . History of echocardiogram    a. 04/2018 Echo: Ef 60-65%, no rwma, midly to mod dil Ao root - 4.1cm. Asc Ao 3.5cm. Mild MR. Nl RV fxn. Nl PASP.  Marland Kitchen History of hernia repair   . History of stress test    a. 2016 MV ([name]): EF 58%, no ischemia.  . Hyperlipidemia   . Hypertension   . Leg pain   . Legally blind   . Necrotic toes ([place]) 02/21/2018  . PAD (peripheral artery disease) ([place])   . PAF (paroxysmal atrial  fibrillation) ([name])    a. on Eliquis as of 2018; b. CHADS2VASc => 5 (HTN, DM, stroke x 2, vascular disease)  . Peripheral vascular disease ([place])    a. followed by Dr. Lucky Cowboy; b. s/p kissing balloon stents and right external iliac stent in 10/2017; c. LE angiogrpahy 01/2018: No significant arterial occlusive disease in the lower extremities.  . Pulmonary nodules   . Stroke [facility])    a. 2016 & 2018  . Stroke [facility]) 07/11/2018    Past Surgical History:  Procedure Laterality Date  . AMPUTATION Right 06/02/2018   Procedure: AMPUTATION BELOW KNEE;  Surgeon: [name], MD;  Location: [facility] ORS;  Service: Vascular;  Laterality: Right;  . APPENDECTOMY    . COLONOSCOPY WITH PROPOFOL N/A 05/12/2018   Procedure: COLONOSCOPY WITH PROPOFOL;  Surgeon: [name], MD;  Location[facility] ENDOSCOPY;  Service: Gastroenterology;  Laterality: N/A;  . ESOPHAGOGASTRODUODENOSCOPY (EGD) WITH PROPOFOL N/A 05/12/2018   Procedure: ESOPHAGOGASTRODUODENOSCOPY (EGD) WITH PROPOFOL;  Surgeon: [name], MD;  Location: [facility] ENDOSCOPY;  Service: Gastroenterology;  Laterality: N/A;  . GIVENS CAPSULE STUDY N/A 07/13/2018   Procedure: GIVENS CAPSULE STUDY;  Surgeon: [name], MD;  Location: [facility] ENDOSCOPY;  Service: Gastroenterology;  Laterality: N/A;  . HERNIA REPAIR     UMBILICAL  . LOWER EXTREMITY  ANGIOGRAPHY Right 10/25/2017   Procedure: LOWER EXTREMITY ANGIOGRAPHY;  Surgeon: [name], MD;  Location: [place] CV LAB;  Service: Cardiovascular;  Laterality: Right;  . LOWER EXTREMITY ANGIOGRAPHY Right 01/13/2018   Procedure: LOWER EXTREMITY ANGIOGRAPHY;  Surgeon: [name], MD;  Location: [place] CV LAB;  Service: Cardiovascular;  Laterality: Right;  . LOWER EXTREMITY INTERVENTION  10/25/2017   Procedure: LOWER EXTREMITY INTERVENTION;  Surgeon: [name], MD;  Location: [place] CV LAB;  Service: Cardiovascular;;  . TONSILLECTOMY    . WOUND DEBRIDEMENT Right 08/08/2018   Procedure: DEBRIDEMENT WOUND WITH  WOUND VAC APPLICATION;  Surgeon: [name], MD;  Location: [facility] ORS;  Service: Vascular;  Laterality: Right;    Social History   Socioeconomic History  . Marital status: Married    Spouse name: Not on file  . Number of children: 0  . Years of education: Not on file  . Highest education level: Not on file  Occupational History  . Not on file  Social Needs  . Financial resource strain: Not on file  . Food insecurity:    Worry: Not on file    Inability: Not on file  . Transportation needs:    Medical: Not on file    Non-medical: Not on file  Tobacco Use  . Smoking status: Former Smoker    Packs/day: 1.00    Years: 30.00    Pack years: 30.00    Types: Cigarettes    Last attempt to quit: 01/09/2018    Years since quitting: 0.6  . Smokeless tobacco: Never Used  Substance and Sexual Activity  . Alcohol use: Not Currently    Alcohol/week: 8.0 standard drinks    Types: 8 Cans of beer per week    Comment: occasionally beer   . Drug use: No  . Sexual activity: Yes  Lifestyle  . Physical activity:    Days per week: Not on file    Minutes per session: Not on file  . Stress: Not on file  Relationships  . Social connections:    Talks on phone: Not on file    Gets together: Not on file    Attends religious service: Not on file    Active member of club or organization: Not on file    Attends meetings of clubs or organizations: Not on file    Relationship status: Not on file  . Intimate partner violence:    Fear of current or ex partner: Not on file    Emotionally abused: Not on file    Physically abused: Not on file    Forced sexual activity: Not on file  Other Topics Concern  . Not on file  Social History Narrative  . Not on file    Family History  Problem Relation Age of Onset  . Diabetes Father   . Hypertension Father   . Hyperlipidemia Father     Allergies  Allergen Reactions  . Sodium Pentobarbital [Pentobarbital] Shortness Of Breath  . Lipitor  [Atorvastatin] Rash     Review of Systems   Review of Systems: Negative Unless Checked Constitutional: [] Weight loss  [] Fever  [] Chills Cardiac: [] Chest pain   []  Atrial Fibrillation  [] Palpitations   [] Shortness of breath when laying flat   [] Shortness of breath with exertion. [] Shortness of breath at rest Vascular:  [] Pain in legs with walking   [] Pain in legs with standing [] Pain in legs when laying flat   [] Claudication    [] Pain in feet  when laying flat    [] History of DVT   [] Phlebitis   [] Swelling in legs   [] Varicose veins   [] Non-healing ulcers Pulmonary:   [] Uses home oxygen   [] Productive cough   [] Hemoptysis   [] Wheeze  [] COPD   [] Asthma Neurologic:  [] Dizziness   [] Seizures  [] Blackouts [x] History of stroke   [] History of TIA  [] Aphasia   [] Temporary Blindness   [] Weakness or numbness in arm   [x] Weakness or numbness in leg Musculoskeletal:   [] Joint swelling   [] Joint pain   [] Low back pain  []  History of Knee Replacement [] Arthritis [] back Surgeries  []  Spinal Stenosis    Hematologic:  [] Easy bruising  [] Easy bleeding   [] Hypercoagulable state   [x] Anemic Gastrointestinal:  [] Diarrhea   [] Vomiting  [] Gastroesophageal reflux/heartburn   [] Difficulty swallowing. [] Abdominal pain Genitourinary:  [x] Chronic kidney disease   [] Difficult urination  [] Anuric   [] Blood in urine [] Frequent urination  [] Burning with urination   [] Hematuria Skin:  [] Rashes   [] Ulcers [x] Wounds Psychological:  [] History of anxiety   []  History of major depression  []  Memory Difficulties     Objective:   Physical Exam  BP 115/72   Pulse 80   Resp 16   Ht 5\' 10"  (1.778 m)   Wt 180 lb (81.6 kg)   BMI 25.83 kg/m   Gen: WD/WN, NAD Head: [place]/AT, No temporalis wasting.  Ear/Nose/Throat: Hearing grossly intact, nares w/o erythema or drainage Eyes: PER, EOMI, sclera nonicteric.  Neck: Supple, no masses.  No JVD.  Pulmonary:  Good air movement, no use of accessory muscles.  Cardiac: RRR Vascular:   Amputation site after debridement looks red beefy with some areas of bleeding Vessel Right Left  Radial Palpable Palpable   Gastrointestinal: soft, non-distended. No guarding/no peritoneal signs.  Musculoskeletal: M/S 5/5 throughout.    Right lower extremity below-knee amputation Neurologic: Pain and light touch intact in extremities.  Symmetrical.  Speech is fluent. Motor exam as listed above. Psychiatric: Judgment intact, Mood & affect appropriate for pt's clinical situation. Dermatologic: No Venous rashes. No Ulcers Noted.  No changes consistent with cellulitis. Lymph : No Cervical lymphadenopathy, no lichenification or skin changes of chronic lymphedema.      Assessment & Plan:   1. Complication of below knee amputation stump ([name]) Wound appears to be progressing well following debridement.  The patient does have some difficulty with pain control.  This is also worsened by his memory difficulties.  Spoke extensively with the patient as well as his wife that while it is expected that he may have some pain and discomfort following debridement, we would not be able to continue to give him narcotic pain medication for a long-term basis.  We will need to start working on weaning him down.  In attempt to do so I have given him a prescription for tramadol.  Again I also stressed that the this is the beginning of starting to wean him off of narcotic pain medications.  As far as concerns as to how long the wound healing may take, and for the patient that this is variable and there is no way to say how long it would take.  I also informed him that it could be possible that he may require skin graft but that would be down the road if we saw that the healing of his wound was not progressing.  He will follow-up in 1 month with no studies.  He is instructed to call the office if he needs to  be seen sooner. - traMADol (ULTRAM) 50 MG tablet; Take 1 tablet (50 mg total) by mouth every 6 (six) hours as  needed for up to 8 days.  Dispense: 30 tablet; Refill: 0  2. 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  Hgb A1C to be monitored as already arranged by primary service   3. Mixed hyperlipidemia Continue statin as ordered and reviewed, no changes at this time    Current Outpatient Medications on File Prior to Visit  Medication Sig Dispense Refill  . acetaminophen (TYLENOL) 500 MG tablet Take 1,000 mg by mouth every 6 (six) hours as needed (for pain.).    Marland Kitchen apixaban (ELIQUIS) 5 MG TABS tablet Take 1 tablet (5 mg total) by mouth 2 (two) times daily. 180 tablet 1  . aspirin 81 MG EC tablet Take 1 tablet (81 mg total) by mouth daily. 90 tablet 4  . Biotin (BIOTIN 5000) 5 MG CAPS Take 1 capsule by mouth daily.    . Calcium Carb-Cholecalciferol (CALCIUM 600-D PO) Take by mouth daily.    . cholecalciferol (VITAMIN D3) 25 MCG (1000 UT) tablet Take 1,000 Units by mouth daily.    . ciprofloxacin (CIPRO) 750 MG tablet Take 1 tablet (750 mg total) by mouth 2 (two) times daily. 60 tablet 1  . donepezil (ARICEPT) 5 MG tablet Take 5 mg by mouth at bedtime.    . empagliflozin (JARDIANCE) 10 MG TABS tablet Take 5 mg by mouth daily. 90 tablet 1  . ezetimibe-simvastatin (VYTORIN) 10-40 MG tablet Take 1 tablet by mouth daily at 6 PM. 90 tablet 3  . ferrous sulfate (FERROUSUL) 325 (65 FE) MG tablet Take 1 tablet (325 mg total) by mouth daily with breakfast. 90 tablet 3  . gabapentin (NEURONTIN) 800 MG tablet Take 1 tablet (800 mg total) by mouth 3 (three) times daily. 270 tablet 1  . hydrochlorothiazide (MICROZIDE) 12.5 MG capsule Take 12.5 mg by mouth daily.    . lansoprazole (PREVACID) 30 MG capsule Take 30 mg by mouth daily at 12 noon.    . metFORMIN (GLUCOPHAGE) 1000 MG tablet Take 1 tablet (1,000 mg total) by mouth 2 (two) times daily with a meal.  180 tablet 3  . metoprolol tartrate (LOPRESSOR) 25 MG tablet Take 1 tablet (25 mg total) by mouth 2 (two) times daily. 180 tablet 3  . Multiple Vitamin (MULTIVITAMIN WITH MINERALS) TABS tablet Take 1 tablet by mouth daily. 30 tablet 0  . nystatin (MYCOSTATIN/NYSTOP) powder Apply topically 4 (four) times daily. 15 g 0  . oxyCODONE-acetaminophen (PERCOCET/ROXICET) 5-325 MG tablet Take 1-2 tablets by mouth 30 (thirty) minutes before procedure for 10 doses. Take prior to dressing changes. 20 tablet 0  . vitamin B-12 (CYANOCOBALAMIN) 1000 MCG tablet Take 1,000 mcg by mouth daily.    . vitamin C (ASCORBIC ACID) 500 MG tablet Take 500 mg by mouth daily.    Marland Kitchen oxyCODONE (OXY IR/ROXICODONE) 5 MG immediate release tablet Take 1 tablet (5 mg total) by mouth every 6 (six) hours as needed for up to 7 days for severe pain. Must last 7 days. 28 tablet 0   No current facility-administered medications on file prior to visit.     There are no Patient Instructions on file for this visit. No follow-ups on file.   Kris Hartmann, NP  This note was completed with Sales executive.  Any  errors are purely unintentional.

## 2018-08-29 ENCOUNTER — Telehealth (INDEPENDENT_AMBULATORY_CARE_PROVIDER_SITE_OTHER): Payer: Self-pay

## 2018-08-29 DIAGNOSIS — I69351 Hemiplegia and hemiparesis following cerebral infarction affecting right dominant side: Secondary | ICD-10-CM | POA: Diagnosis not present

## 2018-08-29 DIAGNOSIS — Z4781 Encounter for orthopedic aftercare following surgical amputation: Secondary | ICD-10-CM | POA: Diagnosis not present

## 2018-08-29 DIAGNOSIS — T8189XA Other complications of procedures, not elsewhere classified, initial encounter: Secondary | ICD-10-CM | POA: Diagnosis not present

## 2018-08-29 DIAGNOSIS — E1152 Type 2 diabetes mellitus with diabetic peripheral angiopathy with gangrene: Secondary | ICD-10-CM | POA: Diagnosis not present

## 2018-08-29 DIAGNOSIS — I480 Paroxysmal atrial fibrillation: Secondary | ICD-10-CM | POA: Diagnosis not present

## 2018-08-29 DIAGNOSIS — Z89511 Acquired absence of right leg below knee: Secondary | ICD-10-CM | POA: Diagnosis not present

## 2018-08-29 DIAGNOSIS — E785 Hyperlipidemia, unspecified: Secondary | ICD-10-CM | POA: Diagnosis not present

## 2018-08-29 DIAGNOSIS — D649 Anemia, unspecified: Secondary | ICD-10-CM | POA: Diagnosis not present

## 2018-08-29 DIAGNOSIS — I10 Essential (primary) hypertension: Secondary | ICD-10-CM | POA: Diagnosis not present

## 2018-08-29 DIAGNOSIS — I77810 Thoracic aortic ectasia: Secondary | ICD-10-CM | POA: Diagnosis not present

## 2018-08-29 DIAGNOSIS — R911 Solitary pulmonary nodule: Secondary | ICD-10-CM | POA: Diagnosis not present

## 2018-08-29 NOTE — Telephone Encounter (Signed)
Left message for patient of below information

## 2018-08-29 NOTE — Telephone Encounter (Signed)
Patients wife called and left a message on the triage line and stated that the patient has a refill for his Cipro and she is unsure if he should continue this for another month or not. Can call back his wife Neoma Laming which per DPR in chart is allowed by patient [phone]  Please advise

## 2018-08-30 DIAGNOSIS — I10 Essential (primary) hypertension: Secondary | ICD-10-CM | POA: Diagnosis not present

## 2018-08-30 DIAGNOSIS — D649 Anemia, unspecified: Secondary | ICD-10-CM | POA: Diagnosis not present

## 2018-08-30 DIAGNOSIS — I77810 Thoracic aortic ectasia: Secondary | ICD-10-CM | POA: Diagnosis not present

## 2018-08-30 DIAGNOSIS — E785 Hyperlipidemia, unspecified: Secondary | ICD-10-CM | POA: Diagnosis not present

## 2018-08-30 DIAGNOSIS — R911 Solitary pulmonary nodule: Secondary | ICD-10-CM | POA: Diagnosis not present

## 2018-08-30 DIAGNOSIS — I69351 Hemiplegia and hemiparesis following cerebral infarction affecting right dominant side: Secondary | ICD-10-CM | POA: Diagnosis not present

## 2018-08-30 DIAGNOSIS — E1152 Type 2 diabetes mellitus with diabetic peripheral angiopathy with gangrene: Secondary | ICD-10-CM | POA: Diagnosis not present

## 2018-08-30 DIAGNOSIS — Z4781 Encounter for orthopedic aftercare following surgical amputation: Secondary | ICD-10-CM | POA: Diagnosis not present

## 2018-08-30 DIAGNOSIS — Z89511 Acquired absence of right leg below knee: Secondary | ICD-10-CM | POA: Diagnosis not present

## 2018-08-30 DIAGNOSIS — I480 Paroxysmal atrial fibrillation: Secondary | ICD-10-CM | POA: Diagnosis not present

## 2018-08-31 DIAGNOSIS — Z89511 Acquired absence of right leg below knee: Secondary | ICD-10-CM | POA: Diagnosis not present

## 2018-08-31 DIAGNOSIS — I10 Essential (primary) hypertension: Secondary | ICD-10-CM | POA: Diagnosis not present

## 2018-08-31 DIAGNOSIS — E785 Hyperlipidemia, unspecified: Secondary | ICD-10-CM | POA: Diagnosis not present

## 2018-08-31 DIAGNOSIS — Z4781 Encounter for orthopedic aftercare following surgical amputation: Secondary | ICD-10-CM | POA: Diagnosis not present

## 2018-08-31 DIAGNOSIS — I480 Paroxysmal atrial fibrillation: Secondary | ICD-10-CM | POA: Diagnosis not present

## 2018-08-31 DIAGNOSIS — I69351 Hemiplegia and hemiparesis following cerebral infarction affecting right dominant side: Secondary | ICD-10-CM | POA: Diagnosis not present

## 2018-08-31 DIAGNOSIS — E1152 Type 2 diabetes mellitus with diabetic peripheral angiopathy with gangrene: Secondary | ICD-10-CM | POA: Diagnosis not present

## 2018-08-31 DIAGNOSIS — R911 Solitary pulmonary nodule: Secondary | ICD-10-CM | POA: Diagnosis not present

## 2018-08-31 DIAGNOSIS — D649 Anemia, unspecified: Secondary | ICD-10-CM | POA: Diagnosis not present

## 2018-08-31 DIAGNOSIS — I77810 Thoracic aortic ectasia: Secondary | ICD-10-CM | POA: Diagnosis not present

## 2018-09-01 ENCOUNTER — Telehealth: Payer: Self-pay | Admitting: Family Medicine

## 2018-09-01 DIAGNOSIS — I480 Paroxysmal atrial fibrillation: Secondary | ICD-10-CM | POA: Diagnosis not present

## 2018-09-01 DIAGNOSIS — Z89511 Acquired absence of right leg below knee: Secondary | ICD-10-CM | POA: Diagnosis not present

## 2018-09-01 DIAGNOSIS — E785 Hyperlipidemia, unspecified: Secondary | ICD-10-CM | POA: Diagnosis not present

## 2018-09-01 DIAGNOSIS — I69351 Hemiplegia and hemiparesis following cerebral infarction affecting right dominant side: Secondary | ICD-10-CM | POA: Diagnosis not present

## 2018-09-01 DIAGNOSIS — R911 Solitary pulmonary nodule: Secondary | ICD-10-CM | POA: Diagnosis not present

## 2018-09-01 DIAGNOSIS — I77810 Thoracic aortic ectasia: Secondary | ICD-10-CM | POA: Diagnosis not present

## 2018-09-01 DIAGNOSIS — I10 Essential (primary) hypertension: Secondary | ICD-10-CM | POA: Diagnosis not present

## 2018-09-01 DIAGNOSIS — Z4781 Encounter for orthopedic aftercare following surgical amputation: Secondary | ICD-10-CM | POA: Diagnosis not present

## 2018-09-01 DIAGNOSIS — D649 Anemia, unspecified: Secondary | ICD-10-CM | POA: Diagnosis not present

## 2018-09-01 DIAGNOSIS — E1152 Type 2 diabetes mellitus with diabetic peripheral angiopathy with gangrene: Secondary | ICD-10-CM | POA: Diagnosis not present

## 2018-09-01 NOTE — Telephone Encounter (Signed)
Copied from [place] [id number]. Topic: Medicare AWV >> Sep 01, 2018 10:46 AM Sherren Kerns wrote: Called to schedule Medicare Annual Wellness Visit with the Nurse Health Advisor.   If patient returns call, please note: their last AWV was on 08/20/2017, please schedule AWV-S with [facility] any date AFTER 08/20/2018.  Thank you! For any questions please contact: Janace Hoard at [phone] or Skype [email or web address]  09/01/2018 at 12:11 PM, Patient scheduled AWV for 09/15/2018 at 2:30 PM.  Janace Hoard, Care Guide.

## 2018-09-02 ENCOUNTER — Telehealth (INDEPENDENT_AMBULATORY_CARE_PROVIDER_SITE_OTHER): Payer: Self-pay

## 2018-09-02 DIAGNOSIS — E1152 Type 2 diabetes mellitus with diabetic peripheral angiopathy with gangrene: Secondary | ICD-10-CM | POA: Diagnosis not present

## 2018-09-02 DIAGNOSIS — E785 Hyperlipidemia, unspecified: Secondary | ICD-10-CM | POA: Diagnosis not present

## 2018-09-02 DIAGNOSIS — I77810 Thoracic aortic ectasia: Secondary | ICD-10-CM | POA: Diagnosis not present

## 2018-09-02 DIAGNOSIS — I10 Essential (primary) hypertension: Secondary | ICD-10-CM | POA: Diagnosis not present

## 2018-09-02 DIAGNOSIS — I69351 Hemiplegia and hemiparesis following cerebral infarction affecting right dominant side: Secondary | ICD-10-CM | POA: Diagnosis not present

## 2018-09-02 DIAGNOSIS — R911 Solitary pulmonary nodule: Secondary | ICD-10-CM | POA: Diagnosis not present

## 2018-09-02 DIAGNOSIS — Z89511 Acquired absence of right leg below knee: Secondary | ICD-10-CM | POA: Diagnosis not present

## 2018-09-02 DIAGNOSIS — I480 Paroxysmal atrial fibrillation: Secondary | ICD-10-CM | POA: Diagnosis not present

## 2018-09-02 DIAGNOSIS — Z4781 Encounter for orthopedic aftercare following surgical amputation: Secondary | ICD-10-CM | POA: Diagnosis not present

## 2018-09-02 DIAGNOSIS — D649 Anemia, unspecified: Secondary | ICD-10-CM | POA: Diagnosis not present

## 2018-09-02 NOTE — Telephone Encounter (Signed)
Amy from [facility] called and left a message on the triage line and stated that she would like verbal orders to continue speech therapy with the patient 2 times a week for 2 weeks. Please advise  [phone]

## 2018-09-02 NOTE — Telephone Encounter (Signed)
Left on [name]'s confidential voicemail

## 2018-09-05 DIAGNOSIS — I77810 Thoracic aortic ectasia: Secondary | ICD-10-CM | POA: Diagnosis not present

## 2018-09-05 DIAGNOSIS — Z89511 Acquired absence of right leg below knee: Secondary | ICD-10-CM | POA: Diagnosis not present

## 2018-09-05 DIAGNOSIS — E785 Hyperlipidemia, unspecified: Secondary | ICD-10-CM | POA: Diagnosis not present

## 2018-09-05 DIAGNOSIS — I480 Paroxysmal atrial fibrillation: Secondary | ICD-10-CM | POA: Diagnosis not present

## 2018-09-05 DIAGNOSIS — Z4781 Encounter for orthopedic aftercare following surgical amputation: Secondary | ICD-10-CM | POA: Diagnosis not present

## 2018-09-05 DIAGNOSIS — D649 Anemia, unspecified: Secondary | ICD-10-CM | POA: Diagnosis not present

## 2018-09-05 DIAGNOSIS — I10 Essential (primary) hypertension: Secondary | ICD-10-CM | POA: Diagnosis not present

## 2018-09-05 DIAGNOSIS — I69351 Hemiplegia and hemiparesis following cerebral infarction affecting right dominant side: Secondary | ICD-10-CM | POA: Diagnosis not present

## 2018-09-05 DIAGNOSIS — E1152 Type 2 diabetes mellitus with diabetic peripheral angiopathy with gangrene: Secondary | ICD-10-CM | POA: Diagnosis not present

## 2018-09-05 DIAGNOSIS — R911 Solitary pulmonary nodule: Secondary | ICD-10-CM | POA: Diagnosis not present

## 2018-09-06 ENCOUNTER — Other Ambulatory Visit (INDEPENDENT_AMBULATORY_CARE_PROVIDER_SITE_OTHER): Payer: Self-pay

## 2018-09-06 DIAGNOSIS — T8189XA Other complications of procedures, not elsewhere classified, initial encounter: Secondary | ICD-10-CM | POA: Diagnosis not present

## 2018-09-06 NOTE — Telephone Encounter (Signed)
Pt's wife left vm states pt states he needs a refill for his tramadol. Last prescribed on 08/22/18 for qty #30 for a 8 day supply.

## 2018-09-07 ENCOUNTER — Other Ambulatory Visit (INDEPENDENT_AMBULATORY_CARE_PROVIDER_SITE_OTHER): Payer: Self-pay | Admitting: Nurse Practitioner

## 2018-09-07 DIAGNOSIS — D649 Anemia, unspecified: Secondary | ICD-10-CM | POA: Diagnosis not present

## 2018-09-07 DIAGNOSIS — E785 Hyperlipidemia, unspecified: Secondary | ICD-10-CM | POA: Diagnosis not present

## 2018-09-07 DIAGNOSIS — I10 Essential (primary) hypertension: Secondary | ICD-10-CM | POA: Diagnosis not present

## 2018-09-07 DIAGNOSIS — E1152 Type 2 diabetes mellitus with diabetic peripheral angiopathy with gangrene: Secondary | ICD-10-CM | POA: Diagnosis not present

## 2018-09-07 DIAGNOSIS — I480 Paroxysmal atrial fibrillation: Secondary | ICD-10-CM | POA: Diagnosis not present

## 2018-09-07 DIAGNOSIS — Z4781 Encounter for orthopedic aftercare following surgical amputation: Secondary | ICD-10-CM | POA: Diagnosis not present

## 2018-09-07 DIAGNOSIS — R911 Solitary pulmonary nodule: Secondary | ICD-10-CM | POA: Diagnosis not present

## 2018-09-07 DIAGNOSIS — Z89511 Acquired absence of right leg below knee: Secondary | ICD-10-CM | POA: Diagnosis not present

## 2018-09-07 DIAGNOSIS — I77810 Thoracic aortic ectasia: Secondary | ICD-10-CM | POA: Diagnosis not present

## 2018-09-07 DIAGNOSIS — I69351 Hemiplegia and hemiparesis following cerebral infarction affecting right dominant side: Secondary | ICD-10-CM | POA: Diagnosis not present

## 2018-09-07 MED ORDER — TRAMADOL HCL 50 MG PO TABS: 50.0000 mg | ORAL_TABLET | Freq: Four times a day (QID) | ORAL | 0 refills | Status: DC | PRN

## 2018-09-07 NOTE — Telephone Encounter (Signed)
I've sent this in, after this prescription we will need to start weaning some.

## 2018-09-07 NOTE — Telephone Encounter (Signed)
Sent via mychart because patient is active.

## 2018-09-09 ENCOUNTER — Telehealth (INDEPENDENT_AMBULATORY_CARE_PROVIDER_SITE_OTHER): Payer: Self-pay | Admitting: Vascular Surgery

## 2018-09-09 DIAGNOSIS — I96 Gangrene, not elsewhere classified: Secondary | ICD-10-CM | POA: Diagnosis not present

## 2018-09-09 DIAGNOSIS — E785 Hyperlipidemia, unspecified: Secondary | ICD-10-CM | POA: Diagnosis not present

## 2018-09-09 DIAGNOSIS — I480 Paroxysmal atrial fibrillation: Secondary | ICD-10-CM | POA: Diagnosis not present

## 2018-09-09 DIAGNOSIS — E1152 Type 2 diabetes mellitus with diabetic peripheral angiopathy with gangrene: Secondary | ICD-10-CM | POA: Diagnosis not present

## 2018-09-09 DIAGNOSIS — I69351 Hemiplegia and hemiparesis following cerebral infarction affecting right dominant side: Secondary | ICD-10-CM | POA: Diagnosis not present

## 2018-09-09 DIAGNOSIS — Z89511 Acquired absence of right leg below knee: Secondary | ICD-10-CM | POA: Diagnosis not present

## 2018-09-09 DIAGNOSIS — I10 Essential (primary) hypertension: Secondary | ICD-10-CM | POA: Diagnosis not present

## 2018-09-09 DIAGNOSIS — D649 Anemia, unspecified: Secondary | ICD-10-CM | POA: Diagnosis not present

## 2018-09-09 DIAGNOSIS — I77810 Thoracic aortic ectasia: Secondary | ICD-10-CM | POA: Diagnosis not present

## 2018-09-09 DIAGNOSIS — R911 Solitary pulmonary nodule: Secondary | ICD-10-CM | POA: Diagnosis not present

## 2018-09-09 DIAGNOSIS — Z4781 Encounter for orthopedic aftercare following surgical amputation: Secondary | ICD-10-CM | POA: Diagnosis not present

## 2018-09-09 NOTE — Telephone Encounter (Signed)
Please call back and give her a verbal from me per dr. dew

## 2018-09-09 NOTE — Telephone Encounter (Signed)
[name] from [facility] called and stated they canceled pt's visit yesterday due to weather. She is requesting a verbal order to go back out since missing that visit. Call back number [phone]

## 2018-09-09 NOTE — Telephone Encounter (Signed)
Amy did not answer, left verbal orders on her confidential vm.  Nothing further is needed

## 2018-09-12 DIAGNOSIS — D649 Anemia, unspecified: Secondary | ICD-10-CM | POA: Diagnosis not present

## 2018-09-12 DIAGNOSIS — I69351 Hemiplegia and hemiparesis following cerebral infarction affecting right dominant side: Secondary | ICD-10-CM | POA: Diagnosis not present

## 2018-09-12 DIAGNOSIS — R911 Solitary pulmonary nodule: Secondary | ICD-10-CM | POA: Diagnosis not present

## 2018-09-12 DIAGNOSIS — E1152 Type 2 diabetes mellitus with diabetic peripheral angiopathy with gangrene: Secondary | ICD-10-CM | POA: Diagnosis not present

## 2018-09-12 DIAGNOSIS — I77810 Thoracic aortic ectasia: Secondary | ICD-10-CM | POA: Diagnosis not present

## 2018-09-12 DIAGNOSIS — I480 Paroxysmal atrial fibrillation: Secondary | ICD-10-CM | POA: Diagnosis not present

## 2018-09-12 DIAGNOSIS — I10 Essential (primary) hypertension: Secondary | ICD-10-CM | POA: Diagnosis not present

## 2018-09-12 DIAGNOSIS — E785 Hyperlipidemia, unspecified: Secondary | ICD-10-CM | POA: Diagnosis not present

## 2018-09-12 DIAGNOSIS — Z89511 Acquired absence of right leg below knee: Secondary | ICD-10-CM | POA: Diagnosis not present

## 2018-09-12 DIAGNOSIS — Z4781 Encounter for orthopedic aftercare following surgical amputation: Secondary | ICD-10-CM | POA: Diagnosis not present

## 2018-09-13 ENCOUNTER — Inpatient Hospital Stay: Payer: Medicare HMO | Attending: Oncology

## 2018-09-13 DIAGNOSIS — Z7984 Long term (current) use of oral hypoglycemic drugs: Secondary | ICD-10-CM | POA: Insufficient documentation

## 2018-09-13 DIAGNOSIS — Z7901 Long term (current) use of anticoagulants: Secondary | ICD-10-CM | POA: Insufficient documentation

## 2018-09-13 DIAGNOSIS — Z87891 Personal history of nicotine dependence: Secondary | ICD-10-CM | POA: Insufficient documentation

## 2018-09-13 DIAGNOSIS — Z8673 Personal history of transient ischemic attack (TIA), and cerebral infarction without residual deficits: Secondary | ICD-10-CM | POA: Insufficient documentation

## 2018-09-13 DIAGNOSIS — K922 Gastrointestinal hemorrhage, unspecified: Secondary | ICD-10-CM | POA: Diagnosis not present

## 2018-09-13 DIAGNOSIS — I480 Paroxysmal atrial fibrillation: Secondary | ICD-10-CM | POA: Diagnosis not present

## 2018-09-13 DIAGNOSIS — Z79899 Other long term (current) drug therapy: Secondary | ICD-10-CM | POA: Diagnosis not present

## 2018-09-13 DIAGNOSIS — D500 Iron deficiency anemia secondary to blood loss (chronic): Secondary | ICD-10-CM | POA: Diagnosis present

## 2018-09-13 DIAGNOSIS — Z7982 Long term (current) use of aspirin: Secondary | ICD-10-CM | POA: Diagnosis not present

## 2018-09-13 DIAGNOSIS — I10 Essential (primary) hypertension: Secondary | ICD-10-CM | POA: Insufficient documentation

## 2018-09-13 DIAGNOSIS — D509 Iron deficiency anemia, unspecified: Secondary | ICD-10-CM

## 2018-09-13 LAB — IRON AND TIBC
Iron: 39 ug/dL — ABNORMAL LOW (ref 45–182)
Saturation Ratios: 9 % — ABNORMAL LOW (ref 17.9–39.5)
TIBC: 450 ug/dL (ref 250–450)
UIBC: 411 ug/dL

## 2018-09-13 LAB — CBC WITH DIFFERENTIAL/PLATELET
Abs Immature Granulocytes: 0.01 10*3/uL (ref 0.00–0.07)
Basophils Absolute: 0 10*3/uL (ref 0.0–0.1)
Basophils Relative: 1 %
Eosinophils Absolute: 0.2 10*3/uL (ref 0.0–0.5)
Eosinophils Relative: 4 %
HCT: 43 % (ref 39.0–52.0)
Hemoglobin: 13.2 g/dL (ref 13.0–17.0)
IMMATURE GRANULOCYTES: 0 %
LYMPHS PCT: 25 %
Lymphs Abs: 1.4 10*3/uL (ref 0.7–4.0)
MCH: 24.7 pg — ABNORMAL LOW (ref 26.0–34.0)
MCHC: 30.7 g/dL (ref 30.0–36.0)
MCV: 80.5 fL (ref 80.0–100.0)
Monocytes Absolute: 0.5 10*3/uL (ref 0.1–1.0)
Monocytes Relative: 9 %
NEUTROS PCT: 61 %
Neutro Abs: 3.4 10*3/uL (ref 1.7–7.7)
Platelets: 186 10*3/uL (ref 150–400)
RBC: 5.34 MIL/uL (ref 4.22–5.81)
RDW: 15.6 % — ABNORMAL HIGH (ref 11.5–15.5)
WBC: 5.7 10*3/uL (ref 4.0–10.5)
nRBC: 0 % (ref 0.0–0.2)

## 2018-09-13 LAB — FERRITIN: Ferritin: 28 ng/mL (ref 24–336)

## 2018-09-14 ENCOUNTER — Inpatient Hospital Stay: Payer: Medicare HMO

## 2018-09-14 ENCOUNTER — Inpatient Hospital Stay (HOSPITAL_BASED_OUTPATIENT_CLINIC_OR_DEPARTMENT_OTHER): Payer: Medicare HMO | Admitting: Oncology

## 2018-09-14 ENCOUNTER — Encounter: Payer: Self-pay | Admitting: Oncology

## 2018-09-14 ENCOUNTER — Other Ambulatory Visit: Payer: Self-pay

## 2018-09-14 VITALS — BP 104/61 | HR 88 | Temp 97.3°F | Wt 182.0 lb

## 2018-09-14 DIAGNOSIS — Z7982 Long term (current) use of aspirin: Secondary | ICD-10-CM

## 2018-09-14 DIAGNOSIS — Z7984 Long term (current) use of oral hypoglycemic drugs: Secondary | ICD-10-CM

## 2018-09-14 DIAGNOSIS — I10 Essential (primary) hypertension: Secondary | ICD-10-CM | POA: Diagnosis not present

## 2018-09-14 DIAGNOSIS — Z87891 Personal history of nicotine dependence: Secondary | ICD-10-CM

## 2018-09-14 DIAGNOSIS — K922 Gastrointestinal hemorrhage, unspecified: Secondary | ICD-10-CM

## 2018-09-14 DIAGNOSIS — D509 Iron deficiency anemia, unspecified: Secondary | ICD-10-CM

## 2018-09-14 DIAGNOSIS — Z8673 Personal history of transient ischemic attack (TIA), and cerebral infarction without residual deficits: Secondary | ICD-10-CM

## 2018-09-14 DIAGNOSIS — I480 Paroxysmal atrial fibrillation: Secondary | ICD-10-CM

## 2018-09-14 DIAGNOSIS — Z7901 Long term (current) use of anticoagulants: Secondary | ICD-10-CM | POA: Diagnosis not present

## 2018-09-14 DIAGNOSIS — D500 Iron deficiency anemia secondary to blood loss (chronic): Secondary | ICD-10-CM

## 2018-09-14 DIAGNOSIS — Z79899 Other long term (current) drug therapy: Secondary | ICD-10-CM

## 2018-09-14 MED ORDER — SODIUM CHLORIDE 0.9 % IV SOLN
510.0000 mg | Freq: Once | INTRAVENOUS | Status: AC
  Administered 2018-09-14: 510 mg via INTRAVENOUS
  Filled 2018-09-14: qty 17

## 2018-09-14 MED ORDER — SODIUM CHLORIDE 0.9 % IV SOLN
Freq: Once | INTRAVENOUS | Status: AC
  Administered 2018-09-14: 14:00:00 via INTRAVENOUS
  Filled 2018-09-14: qty 250

## 2018-09-14 NOTE — Progress Notes (Signed)
Patient here today for follow up.  Patient states no new concerns today  

## 2018-09-14 NOTE — Progress Notes (Signed)
Hematology/Oncology follow u p note [facility] [phone] [phone]   Patient Care Team: [name], DO as PCP - General (Family Medicine) Minna Merritts, MD as PCP - Cardiology (Cardiology) [name], DO as Referring Physician (Family Medicine) Yolonda Kida, MD as Consulting Physician (Cardiology) Vladimir Crofts, MD (Neurology) Lucky Cowboy Erskine Squibb, MD as Referring Physician (Vascular Surgery)  REFERRING PROVIDER: [name], DO REASON FOR VISIT:  Follow-up for management of iron deficiency anemia.  HISTORY OF PRESENTING ILLNESS:  Glenn Enke. is a  63 y.o.  male with PMH listed below who was referred to me for evaluation of anemia Reviewed patient's recent labs that was done at [facility] office. Labs revealed anemia with hemoglobin of 11 .   Reviewed patient's previous labs ordered by primary care physician's office, anemia onset was since July 2019 04/07/2018, iron panel showed ferritin 13, iron saturation 28, TIBC 500. He takes oral iron supplementation. 05/06/2018, iron panel showed ferritin 19, iron saturation 4.  TIBC 389. Patient was scheduled for colonoscopy on 05/12/2018 for evaluation of GI blood loss. Patient is on chronic anticoagulation Eliquis for atrial fibrillation and recurrent stroke.  Also on Plavix.  For colonoscopy, he was advised to hold oral iron supplements 5 days prior to the colonoscopy.  Patient has severe peripheral vascular disease with complications including lower extremity ulceration and necrotic toes.  Amputation was held due to acute GI bleed.  Patient reports severe pain for which he takes Neurontin 400 mg 3 times a day and the Percocet 10-325 mg 1 tablet every 6 hours as needed for pain.  Patient reports pain is at a level 5 out of 10.  Appears uncomfortable, alternating between sitting and standing during assessment due to lower extremity discomfort.  He reports that pain medication has  relief his pain sometimes.  Associated signs and symptoms: Patient reports fatigue.  Shortness of breath with exertion.   INTERVAL HISTORY Glenn Fread. is a 63 y.o. male who has above history reviewed by me today presents for follow up visit for management of iron deficiency anemia.  Problems and complaints are listed below: Patient was accompanied by his wife to today's clinic. 06/02/2018, right below-knee amputation.  Patient follows up with Dr. Lucky Cowboy vascular surgery for stump management, debridement.. Status post 1 dose of Feraheme 510mg  at last visit.  Patient prefers to take oral iron tablets.  Reports that oral iron supplementation makes him constipated. Chronic fatigue has improved.  Feels well today. Denies hematochezia, hematuria, hematemesis, epistaxis, black tarry stool or easy bruising.  GI work-up as listed below. Colonoscopy 05/12/2018 by Dr. Vicente Males showed diverticulosis in the sigmoid colon.  Multiple polyps were resected and retrieved 05/12/2018 upper endoscopy showed a normal examined duodenum.  Normal esophagus, gastritis.. 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  Review of Systems  Constitutional: Positive for malaise/fatigue. Negative for chills, fever and weight loss.  HENT: Negative for nosebleeds and sore throat.   Eyes: Negative for double vision, photophobia and redness.  Respiratory: Negative for cough, shortness of breath and wheezing.   Cardiovascular: Negative for chest pain, palpitations and orthopnea.  Gastrointestinal: Negative for abdominal pain, blood in stool, nausea and vomiting.  Genitourinary: Negative for dysuria.  Musculoskeletal: Negative for back pain, myalgias and neck pain.       Right below-knee amputation stump status post debridement.  Healing.  Skin: Negative for itching and rash.  Neurological: Negative  for dizziness, tingling and tremors.  Endo/Heme/Allergies: Negative for  environmental allergies. Does not bruise/bleed easily.  Psychiatric/Behavioral: Negative for depression.    MEDICAL HISTORY:  Past Medical History:  Diagnosis Date  . Acute left PCA stroke ([place]) 02/17/2015  . Anemia   . Atherosclerosis of native arteries of the extremities with gangrene ([place]) 05/08/2018  . Atrial fibrillation ([place])   . Blind    right eye  . Diabetes mellitus with complication ([place])   . Dilated aortic root ([place])    a. 04/2018 Echo: 4.1cm. Asc Ao 3.5cm.  Marland Kitchen Dysrhythmia   . Gangrene of right foot ([place]) 06/02/2018  . GERD (gastroesophageal reflux disease)   . History of echocardiogram    a. 04/2018 Echo: Ef 60-65%, no rwma, midly to mod dil Ao root - 4.1cm. Asc Ao 3.5cm. Mild MR. Nl RV fxn. Nl PASP.  Marland Kitchen History of hernia repair   . History of stress test    a. 2016 MV ([name]): EF 58%, no ischemia.  . Hyperlipidemia   . Hypertension   . Leg pain   . Legally blind   . Necrotic toes ([place]) 02/21/2018  . PAD (peripheral artery disease) ([place])   . PAF (paroxysmal atrial fibrillation) ([name])    a. on Eliquis as of 2018; b. CHADS2VASc => 5 (HTN, DM, stroke x 2, vascular disease)  . Peripheral vascular disease ([place])    a. followed by Dr. Lucky Cowboy; b. s/p kissing balloon stents and right external iliac stent in 10/2017; c. LE angiogrpahy 01/2018: No significant arterial occlusive disease in the lower extremities.  . Pulmonary nodules   . Stroke [facility])    a. 2016 & 2018  . Stroke ([place]) 07/11/2018    SURGICAL HISTORY: Past Surgical History:  Procedure Laterality Date  . AMPUTATION Right 06/02/2018   Procedure: AMPUTATION BELOW KNEE;  Surgeon: [name], MD;  Location: [facility] ORS;  Service: Vascular;  Laterality: Right;  . APPENDECTOMY    . COLONOSCOPY WITH PROPOFOL N/A 05/12/2018   Procedure: COLONOSCOPY WITH PROPOFOL;  Surgeon: [name], MD;  Location: [facility] ENDOSCOPY;  Service: Gastroenterology;  Laterality: N/A;  . ESOPHAGOGASTRODUODENOSCOPY (EGD) WITH PROPOFOL N/A 05/12/2018    Procedure: ESOPHAGOGASTRODUODENOSCOPY (EGD) WITH PROPOFOL;  Surgeon: [name], MD;  Location: [facility] ENDOSCOPY;  Service: Gastroenterology;  Laterality: N/A;  . GIVENS CAPSULE STUDY N/A 07/13/2018   Procedure: GIVENS CAPSULE STUDY;  Surgeon: [name], MD;  Location: [facility] ENDOSCOPY;  Service: Gastroenterology;  Laterality: N/A;  . HERNIA REPAIR     UMBILICAL  . LOWER EXTREMITY ANGIOGRAPHY Right 10/25/2017   Procedure: LOWER EXTREMITY ANGIOGRAPHY;  Surgeon: [name], MD;  Location: [place] CV LAB;  Service: Cardiovascular;  Laterality: Right;  . LOWER EXTREMITY ANGIOGRAPHY Right 01/13/2018   Procedure: LOWER EXTREMITY ANGIOGRAPHY;  Surgeon: [name], MD;  Location: [place] CV LAB;  Service: Cardiovascular;  Laterality: Right;  . LOWER EXTREMITY INTERVENTION  10/25/2017   Procedure: LOWER EXTREMITY INTERVENTION;  Surgeon: [name], MD;  Location: [place] CV LAB;  Service: Cardiovascular;;  . TONSILLECTOMY    . WOUND DEBRIDEMENT Right 08/08/2018   Procedure: DEBRIDEMENT WOUND WITH WOUND VAC APPLICATION;  Surgeon: [name], MD;  Location: [facility] ORS;  Service: Vascular;  Laterality: Right;    SOCIAL HISTORY: Social History   Socioeconomic History  . Marital status: Married    Spouse name: Not on file  . Number of children: 0  . Years of education: Not on file  . Highest education level:  Not on file  Occupational History  . Not on file  Social Needs  . Financial resource strain: Not on file  . Food insecurity:    Worry: Not on file    Inability: Not on file  . Transportation needs:    Medical: Not on file    Non-medical: Not on file  Tobacco Use  . Smoking status: Former Smoker    Packs/day: 1.00    Years: 30.00    Pack years: 30.00    Types: Cigarettes    Last attempt to quit: 01/09/2018    Years since quitting: 0.6  . Smokeless tobacco: Never Used  Substance and Sexual Activity  . Alcohol use: Not Currently    Alcohol/week: 8.0 standard drinks     Types: 8 Cans of beer per week    Comment: occasionally beer   . Drug use: No  . Sexual activity: Yes  Lifestyle  . Physical activity:    Days per week: Not on file    Minutes per session: Not on file  . Stress: Not on file  Relationships  . Social connections:    Talks on phone: Not on file    Gets together: Not on file    Attends religious service: Not on file    Active member of club or organization: Not on file    Attends meetings of clubs or organizations: Not on file    Relationship status: Not on file  . Intimate partner violence:    Fear of current or ex partner: Not on file    Emotionally abused: Not on file    Physically abused: Not on file    Forced sexual activity: Not on file  Other Topics Concern  . Not on file  Social History Narrative  . Not on file    FAMILY HISTORY: Family History  Problem Relation Age of Onset  . Diabetes Father   . Hypertension Father   . Hyperlipidemia Father     ALLERGIES:  is allergic to sodium pentobarbital [pentobarbital] and lipitor [atorvastatin].  MEDICATIONS:  Current Outpatient Medications  Medication Sig Dispense Refill  . acetaminophen (TYLENOL) 500 MG tablet Take 1,000 mg by mouth every 6 (six) hours as needed (for pain.).    Marland Kitchen apixaban (ELIQUIS) 5 MG TABS tablet Take 1 tablet (5 mg total) by mouth 2 (two) times daily. 180 tablet 1  . aspirin 81 MG EC tablet Take 1 tablet (81 mg total) by mouth daily. 90 tablet 4  . Biotin (BIOTIN 5000) 5 MG CAPS Take 1 capsule by mouth daily.    . Calcium Carb-Cholecalciferol (CALCIUM 600-D PO) Take by mouth daily.    . cholecalciferol (VITAMIN D3) 25 MCG (1000 UT) tablet Take 1,000 Units by mouth daily.    . ciprofloxacin (CIPRO) 750 MG tablet Take 1 tablet (750 mg total) by mouth 2 (two) times daily. 60 tablet 1  . donepezil (ARICEPT) 5 MG tablet Take 5 mg by mouth at bedtime.    . empagliflozin (JARDIANCE) 10 MG TABS tablet Take 5 mg by mouth daily. 90 tablet 1  .  ezetimibe-simvastatin (VYTORIN) 10-40 MG tablet Take 1 tablet by mouth daily at 6 PM. 90 tablet 3  . ferrous sulfate (FERROUSUL) 325 (65 FE) MG tablet Take 1 tablet (325 mg total) by mouth daily with breakfast. 90 tablet 3  . gabapentin (NEURONTIN) 800 MG tablet Take 1 tablet (800 mg total) by mouth 3 (three) times daily. 270 tablet 1  . hydrochlorothiazide (MICROZIDE) 12.5 MG  capsule Take 12.5 mg by mouth daily.    . lansoprazole (PREVACID) 30 MG capsule Take 30 mg by mouth daily at 12 noon.    . metFORMIN (GLUCOPHAGE) 1000 MG tablet Take 1 tablet (1,000 mg total) by mouth 2 (two) times daily with a meal. 180 tablet 3  . metoprolol tartrate (LOPRESSOR) 25 MG tablet Take 1 tablet (25 mg total) by mouth 2 (two) times daily. 180 tablet 3  . Multiple Vitamin (MULTIVITAMIN WITH MINERALS) TABS tablet Take 1 tablet by mouth daily. 30 tablet 0  . nystatin (MYCOSTATIN/NYSTOP) powder Apply topically 4 (four) times daily. 15 g 0  . oxyCODONE (OXY IR/ROXICODONE) 5 MG immediate release tablet Take 1 tablet (5 mg total) by mouth every 6 (six) hours as needed for up to 7 days for severe pain. Must last 7 days. 28 tablet 0  . oxyCODONE-acetaminophen (PERCOCET/ROXICET) 5-325 MG tablet Take 1-2 tablets by mouth 30 (thirty) minutes before procedure for 10 doses. Take prior to dressing changes. 20 tablet 0  . traMADol (ULTRAM) 50 MG tablet Take 1 tablet (50 mg total) by mouth every 6 (six) hours as needed. 30 tablet 0  . vitamin B-12 (CYANOCOBALAMIN) 1000 MCG tablet Take 1,000 mcg by mouth daily.    . vitamin C (ASCORBIC ACID) 500 MG tablet Take 500 mg by mouth daily.     No current facility-administered medications for this visit.      PHYSICAL EXAMINATION: ECOG PERFORMANCE STATUS: 2 - Symptomatic, <50% confined to bed Vitals:   09/14/18 1335  BP: 104/61  Pulse: 88  Temp: (!) 97.3 F (36.3 C)   Filed Weights   09/14/18 1335  Weight: 182 lb (82.6 kg)    Physical Exam Constitutional:      General: He  is not in acute distress.    Comments: Wheelchair-bound  HENT:     Head: Normocephalic and atraumatic.  Eyes:     General: No scleral icterus.    Pupils: Pupils are equal, round, and reactive to light.  Neck:     Musculoskeletal: Normal range of motion and neck supple.  Cardiovascular:     Rate and Rhythm: Normal rate and regular rhythm.     Heart sounds: Normal heart sounds.  Pulmonary:     Effort: Pulmonary effort is normal. No respiratory distress.     Breath sounds: No wheezing.  Abdominal:     General: Bowel sounds are normal. There is no distension.     Palpations: Abdomen is soft. There is no mass.     Tenderness: There is no abdominal tenderness.  Musculoskeletal: Normal range of motion.        General: No tenderness or deformity.     Comments: Right lower extremity s/p below knee amputation.   Skin:    General: Skin is warm and dry.     Findings: No erythema or rash.  Neurological:     Mental Status: He is alert and oriented to person, place, and time.     Cranial Nerves: No cranial nerve deficit.     Coordination: Coordination normal.  Psychiatric:        Behavior: Behavior normal.        Thought Content: Thought content normal.      LABORATORY DATA:  I have reviewed the data as listed Lab Results  Component Value Date   WBC 5.7 09/13/2018   HGB 13.2 09/13/2018   HCT 43.0 09/13/2018   MCV 80.5 09/13/2018   PLT 186 09/13/2018   Recent  Labs    03/31/18 1446 04/06/18 1437 05/06/18 1624  06/03/18 0454 06/04/18 0512 07/25/18 1939 08/01/18 0814  NA 137 141 139   < > 137 137  --  137  K 4.4 4.3 4.4   < > 3.7 3.7  --  3.6  CL 98 101 97   < > 103 102  --  101  CO2 24 22 22    < > 27 28  --  26  GLUCOSE 137* 125* 113*   < > 143* 152*  --  168*  BUN 16 11 11    < > 9 14  --  22  CREATININE 0.68* 0.71* 0.67*   < > 0.52* 0.70 0.60* 0.67  CALCIUM 9.4 9.5 9.7   < > 8.5* 8.7*  --  9.7  GFRNONAA 102 100 102   < > >60 >60  --  >60  GFRAA 117 115 118   < > >60 >60   --  >60  PROT 7.2 7.2 7.1  --   --   --   --   --   ALBUMIN 4.2 4.3 4.2  --   --   --   --   --   AST 14 28 15   --   --   --   --   --   ALT 16 19 13   --   --   --   --   --   ALKPHOS 88 80 82  --   --   --   --   --   BILITOT 0.4 0.4 0.2  --   --   --   --   --    < > = values in this interval not displayed.   Iron/TIBC/Ferritin/ %Sat    Component Value Date/Time   IRON 39 (L) 09/13/2018 1523   IRON 17 (L) 05/06/2018 1624   TIBC 450 09/13/2018 1523   TIBC 389 05/06/2018 1624   FERRITIN 28 09/13/2018 1523   FERRITIN 19 (L) 05/06/2018 1624   IRONPCTSAT 9 (L) 09/13/2018 1523   IRONPCTSAT 4 (LL) 05/06/2018 1624        ASSESSMENT & PLAN:  1. Iron deficiency anemia due to chronic blood loss   2. Chronic GI bleeding   3. Chronic anticoagulation    Labs are reviewed and discussed with patient and his wife. Hemoglobin has improved since last visit. He received 1 dose of IV Feraheme 510 mg at last visit and prefers to try oral iron supplementation. Iron panel shows persistent iron deficiency.  Likely due to chronic GI blood loss, chronic anticoagulation contributes to blood loss. I recommend patient to receive IV Feraheme 510 mg weekly x2. Patient only agrees with 1 dose of IV iron today. He prefers to continue take oral iron supplementation and have repeat labs done in 3 months and follow-up in the clinic to see if he needs additional IV iron. Recommend patient to continue follow-up with gastroenterology for ablation of small bowel AVM. RTC 3 months with repeat labs iron TIBC ferritin to be done prior to the visit +/-IV Feraheme. Orders Placed This Encounter  Procedures  . CBC with Differential/Platelet    Standing Status:   Future    Standing Expiration Date:   09/15/2019  . Ferritin    Standing Status:   Future    Standing Expiration Date:   09/15/2019  . Iron and TIBC    Standing Status:   Future    Standing Expiration Date:  09/15/2019    All questions were answered. The  patient knows to call the clinic with any problems questions or concerns.  Return of visit: 3 months  Earlie Server, MD, PhD Hematology Oncology [facility] at [facility] Pager- [phone] 09/14/2018

## 2018-09-15 ENCOUNTER — Ambulatory Visit (INDEPENDENT_AMBULATORY_CARE_PROVIDER_SITE_OTHER): Payer: Medicare HMO

## 2018-09-15 VITALS — BP 118/76 | HR 64 | Temp 97.7°F | Resp 16 | Ht 70.0 in | Wt 183.6 lb

## 2018-09-15 DIAGNOSIS — Z0000 Encounter for general adult medical examination without abnormal findings: Secondary | ICD-10-CM | POA: Diagnosis not present

## 2018-09-15 DIAGNOSIS — Z23 Encounter for immunization: Secondary | ICD-10-CM

## 2018-09-15 NOTE — Progress Notes (Addendum)
Subjective:   [name]. is a 63 y.o. male who presents for Medicare Annual/Subsequent preventive examination.  Review of Systems:   Cardiac Risk Factors include: advanced age (>39men, >43 women);hypertension;dyslipidemia;male gender     Objective:    Vitals: BP 118/76 (BP Location: Left Arm, Patient Position: Sitting, Cuff Size: Normal)   Pulse 64   Temp 97.7 F (36.5 C) (Temporal)   Resp 16   Ht 5\' 10"  (1.778 m)   Wt 183 lb 9.6 oz (83.3 kg)   BMI 26.34 kg/m   Body mass index is 26.34 kg/m.  Advanced Directives 09/14/2018 08/10/2018 08/08/2018 08/01/2018 06/27/2018 06/15/2018 05/26/2018  Does Patient Have a Medical Advance Directive? Yes Yes No No No No No  Type of Advance Directive - Living will;Healthcare Power of Attorney - - - - -  Would patient like information on creating a medical advance directive? - - - No - Patient declined No - Patient declined - -    Tobacco Social History   Tobacco Use  Smoking Status Former Smoker  . Packs/day: 1.00  . Years: 30.00  . Pack years: 30.00  . Types: Cigarettes  . Last attempt to quit: 01/09/2018  . Years since quitting: 0.6  Smokeless Tobacco Never Used     Counseling given: Not Answered   Clinical Intake:  Pre-visit preparation completed: Yes  Pain : 0-10 Pain Score: 5  Pain Type: Chronic pain Pain Location: Leg Pain Orientation: Right Pain Descriptors / Indicators: Aching, Throbbing Pain Onset: More than a month ago Pain Frequency: Constant Pain Relieving Factors: tramadol, gabapentin   Pain Relieving Factors: tramadol, gabapentin   Nutritional Status: BMI 25 -29 Overweight Nutritional Risks: None Diabetes: No  How often do you need to have someone help you when you read instructions, pamphlets, or other written materials from your doctor or pharmacy?: 1 - Never What is the last grade level you completed in school?: associates degree   Interpreter Needed?: No  Information entered by :: Tiffany  Hill,LPN   Past Medical History:  Diagnosis Date  . Acute left PCA stroke ([facility]) 02/17/2015  . Anemia   . Atherosclerosis of native arteries of the extremities with gangrene ([place]) 05/08/2018  . Atrial fibrillation ([place])   . Blind    right eye  . Diabetes mellitus with complication ([place])   . Dilated aortic root ([place])    a. 04/2018 Echo: 4.1cm. Asc Ao 3.5cm.  Marland Kitchen Dysrhythmia   . Gangrene of right foot ([place]) 06/02/2018  . GERD (gastroesophageal reflux disease)   . History of echocardiogram    a. 04/2018 Echo: Ef 60-65%, no rwma, midly to mod dil Ao root - 4.1cm. Asc Ao 3.5cm. Mild MR. Nl RV fxn. Nl PASP.  Marland Kitchen History of hernia repair   . History of stress test    a. 2016 MV ([name]): EF 58%, no ischemia.  . Hyperlipidemia   . Hypertension   . Leg pain   . Legally blind   . Necrotic toes ([place]) 02/21/2018  . PAD (peripheral artery disease) ([place])   . PAF (paroxysmal atrial fibrillation) ([name])    a. on Eliquis as of 2018; b. CHADS2VASc => 5 (HTN, DM, stroke x 2, vascular disease)  . Peripheral vascular disease ([place])    a. followed by Dr. Lucky Cowboy; b. s/p kissing balloon stents and right external iliac stent in 10/2017; c. LE angiogrpahy 01/2018: No significant arterial occlusive disease in the lower extremities.  . Pulmonary nodules   . Stroke [facility])  a. 2016 & 2018  . Stroke [facility]) 07/11/2018   Past Surgical History:  Procedure Laterality Date  . AMPUTATION Right 06/02/2018   Procedure: AMPUTATION BELOW KNEE;  Surgeon: [name], MD;  Location: [facility] ORS;  Service: Vascular;  Laterality: Right;  . APPENDECTOMY    . COLONOSCOPY WITH PROPOFOL N/A 05/12/2018   Procedure: COLONOSCOPY WITH PROPOFOL;  Surgeon: [name], MD;  Location: [facility] ENDOSCOPY;  Service: Gastroenterology;  Laterality: N/A;  . ESOPHAGOGASTRODUODENOSCOPY (EGD) WITH PROPOFOL N/A 05/12/2018   Procedure: ESOPHAGOGASTRODUODENOSCOPY (EGD) WITH PROPOFOL;  Surgeon: [name], MD;  Location: [facility] ENDOSCOPY;  Service: Gastroenterology;   Laterality: N/A;  . GIVENS CAPSULE STUDY N/A 07/13/2018   Procedure: GIVENS CAPSULE STUDY;  Surgeon: [name], MD;  Location: [facility] ENDOSCOPY;  Service: Gastroenterology;  Laterality: N/A;  . HERNIA REPAIR     UMBILICAL  . LOWER EXTREMITY ANGIOGRAPHY Right 10/25/2017   Procedure: LOWER EXTREMITY ANGIOGRAPHY;  Surgeon: [name], MD;  Location: [place] CV LAB;  Service: Cardiovascular;  Laterality: Right;  . LOWER EXTREMITY ANGIOGRAPHY Right 01/13/2018   Procedure: LOWER EXTREMITY ANGIOGRAPHY;  Surgeon: [name], MD;  Location: [place] CV LAB;  Service: Cardiovascular;  Laterality: Right;  . LOWER EXTREMITY INTERVENTION  10/25/2017   Procedure: LOWER EXTREMITY INTERVENTION;  Surgeon: [name], MD;  Location: [place] CV LAB;  Service: Cardiovascular;;  . TONSILLECTOMY    . WOUND DEBRIDEMENT Right 08/08/2018   Procedure: DEBRIDEMENT WOUND WITH WOUND VAC APPLICATION;  Surgeon: [name], MD;  Location: [facility] ORS;  Service: Vascular;  Laterality: Right;   Family History  Problem Relation Age of Onset  . Diabetes Father   . Hypertension Father   . Hyperlipidemia Father    Social History   Socioeconomic History  . Marital status: Married    Spouse name: Not on file  . Number of children: 0  . Years of education: Not on file  . Highest education level: Associate degree: academic program  Occupational History  . Not on file  Social Needs  . Financial resource strain: Somewhat hard  . Food insecurity:    Worry: Never true    Inability: Never true  . Transportation needs:    Medical: No    Non-medical: No  Tobacco Use  . Smoking status: Former Smoker    Packs/day: 1.00    Years: 30.00    Pack years: 30.00    Types: Cigarettes    Last attempt to quit: 01/09/2018    Years since quitting: 0.6  . Smokeless tobacco: Never Used  Substance and Sexual Activity  . Alcohol use: Never    Alcohol/week: 0.0 standard drinks    Frequency: Never    Comment:  occasionally beer   . Drug use: No  . Sexual activity: Yes  Lifestyle  . Physical activity:    Days per week: 0 days    Minutes per session: 0 min  . Stress: Not at all  Relationships  . Social connections:    Talks on phone: More than three times a week    Gets together: Never    Attends religious service: Never    Active member of club or organization: No    Attends meetings of clubs or organizations: Never    Relationship status: Married  Other Topics Concern  . Not on file  Social History Narrative  . Not on file    Outpatient Encounter Medications as of 09/15/2018  Medication Sig  . acetaminophen (TYLENOL) 500  MG tablet Take 1,000 mg by mouth every 6 (six) hours as needed (for pain.).  Marland Kitchen apixaban (ELIQUIS) 5 MG TABS tablet Take 1 tablet (5 mg total) by mouth 2 (two) times daily.  Marland Kitchen aspirin 81 MG EC tablet Take 1 tablet (81 mg total) by mouth daily.  . Calcium Carb-Cholecalciferol (CALCIUM 600-D PO) Take by mouth daily.  . cholecalciferol (VITAMIN D3) 25 MCG (1000 UT) tablet Take 5,000 Units by mouth daily.   Marland Kitchen donepezil (ARICEPT) 5 MG tablet Take 5 mg by mouth at bedtime.  . empagliflozin (JARDIANCE) 10 MG TABS tablet Take 5 mg by mouth daily.  Marland Kitchen ezetimibe-simvastatin (VYTORIN) 10-40 MG tablet Take 1 tablet by mouth daily at 6 PM.  . ferrous sulfate (FERROUSUL) 325 (65 FE) MG tablet Take 1 tablet (325 mg total) by mouth daily with breakfast.  . gabapentin (NEURONTIN) 800 MG tablet Take 1 tablet (800 mg total) by mouth 3 (three) times daily.  . hydrochlorothiazide (MICROZIDE) 12.5 MG capsule Take 12.5 mg by mouth daily.  . lansoprazole (PREVACID) 30 MG capsule Take 30 mg by mouth daily at 12 noon.  . metFORMIN (GLUCOPHAGE) 1000 MG tablet Take 1 tablet (1,000 mg total) by mouth 2 (two) times daily with a meal.  . metoprolol tartrate (LOPRESSOR) 25 MG tablet Take 1 tablet (25 mg total) by mouth 2 (two) times daily.  . Multiple Vitamin (MULTIVITAMIN WITH MINERALS) TABS tablet  Take 1 tablet by mouth daily.  . traMADol (ULTRAM) 50 MG tablet Take 1 tablet (50 mg total) by mouth every 6 (six) hours as needed.  . vitamin B-12 (CYANOCOBALAMIN) 1000 MCG tablet Take 1,000 mcg by mouth daily.  . vitamin C (ASCORBIC ACID) 500 MG tablet Take 500 mg by mouth daily.  . Biotin (BIOTIN 5000) 5 MG CAPS Take 1 capsule by mouth daily.  Marland Kitchen nystatin (MYCOSTATIN/NYSTOP) powder Apply topically 4 (four) times daily. (Patient not taking: Reported on 09/15/2018)   No facility-administered encounter medications on file as of 09/15/2018.     Activities of Daily Living In your present state of health, do you have any difficulty performing the following activities: 09/15/2018 08/01/2018  Hearing? N N  Vision? Y N  Comment Sees Dr.[facility]  -  Difficulty concentrating or making decisions? Tempie Donning  Comment has speech therapy  -  Walking or climbing stairs? Y Y  Dressing or bathing? Tempie Donning  Comment wife assists  -  Doing errands, shopping? Tempie Donning  Comment wife assists  -  Conservation officer, nature and eating ? N -  Using the Toilet? N -  In the past six months, have you accidently leaked urine? N -  Do you have problems with loss of bowel control? N -  Managing your Medications? Y -  Comment wife assists  -  Managing your Finances? Y -  Comment wife assists  -  Housekeeping or managing your Housekeeping? Y -  Comment wife assists  -  Some recent data might be hidden    Patient Care Team: [name], DO as PCP - General (Family Medicine) Rockey Situ Kathlene November, MD as PCP - Cardiology (Cardiology) [name], DO as Referring Physician (Family Medicine) Yolonda Kida, MD as Consulting Physician (Cardiology) Vladimir Crofts, MD (Neurology) Lucky Cowboy Erskine Squibb, MD as Referring Physician (Vascular Surgery)   Assessment:   This is a routine wellness examination for Andrius.  Exercise Activities and Dietary recommendations Current Exercise Habits: The patient does not participate in regular exercise at  present(PT  at home ), Exercise limited by: orthopedic condition(s)  Goals   None     Fall Risk Fall Risk  09/15/2018 08/10/2018 07/20/2018 07/12/2018 07/11/2018  Falls in the past year? 1 0 0 1 1  Comment - - - - amputation 06/02/2018 ,  having balance issues  Number falls in past yr: 0 - - 0 0  Injury with Fall? 0 - - 0 0  Comment went to emergency room  - - - -  Risk for fall due to : Impaired balance/gait;Impaired mobility - - - -  Follow up Falls evaluation completed;Falls prevention discussed;Education provided - - Falls evaluation completed -   FALL RISK PREVENTION PERTAINING TO THE HOME:  Any stairs in or around the home WITH handrails? Yes stairs at front of house  Home free of loose throw rugs in walkways, pet beds, electrical cords, etc? Yes  Adequate lighting in your home to reduce risk of falls? Yes   ASSISTIVE DEVICES UTILIZED TO PREVENT FALLS:  Life alert? No  Use of a cane, walker or w/c? Yes  Grab bars in the bathroom? No  Shower chair or bench in shower? Yes  Elevated toilet seat or a handicapped toilet? Yes   DME ORDERS:  DME order needed?  No   TIMED UP AND GO:  Was the test performed? No .  unable to perform     Depression Screen PHQ 2/9 Scores 09/15/2018 08/10/2018 07/11/2018 08/20/2017  PHQ - 2 Score 0 0 0 0    Cognitive Function     6CIT Screen 09/15/2018 08/20/2017  What Year? 0 points 0 points  What month? 0 points 0 points  What time? 0 points 3 points  Count back from 20 0 points 0 points  Months in reverse 0 points 4 points  Repeat phrase 2 points 4 points  Total Score 2 11    Immunization History  Administered Date(s) Administered  . Influenza, High Dose Seasonal PF 09/15/2018  . Influenza,inj,Quad PF,6+ Mos 06/06/2015, 06/22/2017  . Pneumococcal Polysaccharide-23 06/06/2015  . Zoster 08/09/2015    Qualifies for Shingles Vaccine? Yes  Zostavax completed 08/09/2015. Due for Shingrix. Education has been provided regarding the  importance of this vaccine. Pt has been advised to call insurance company to determine out of pocket expense. Advised may also receive vaccine at local pharmacy or Health Dept. Verbalized acceptance and understanding.  Tdap: up to date   Flu Vaccine: Due for Flu vaccine. Does the patient want to receive this vaccine today?  Yes .   Pneumococcal Vaccine: not indicated   Screening Tests Health Maintenance  Topic Date Due  . OPHTHALMOLOGY EXAM  10/22/2016  . FOOT EXAM  11/02/2017  . INFLUENZA VACCINE  03/03/2018  . URINE MICROALBUMIN  08/20/2018  . HEMOGLOBIN A1C  11/05/2018  . TETANUS/TDAP  11/27/2018  . COLONOSCOPY  05/12/2021  . PNEUMOCOCCAL POLYSACCHARIDE VACCINE AGE 58-64 HIGH RISK  Completed  . HIV Screening  Completed  . Hepatitis C Screening  Addressed  Cancer Screenings:  Colorectal Screening: Completed 05/12/2018. Repeat every 10 years   Lung Cancer Screening: (Low Dose CT Chest recommended if Age 74-80 years, 30 pack-year currently smoking OR have quit w/in 15years.) does not qualify.     Additional Screening:  Hepatitis C Screening: does qualify; Completed 11/17/2013  Vision Screening: Recommended annual ophthalmology exams for early detection of glaucoma and other disorders of the eye. Is the patient up to date with their annual eye exam?  Yes  Who is the  provider or what is the name of the office in which the pt attends annual eye exams? [facility]   Dental Screening: Recommended annual dental exams for proper oral hygiene  Community Resource Referral:  CRR required this visit?  No     Plan:    I have personally reviewed and addressed the Medicare Annual Wellness questionnaire and have noted the following in the patient's chart:  A. Medical and social history B. Use of alcohol, tobacco or illicit drugs  C. Current medications and supplements D. Functional ability and status E.  Nutritional status F.  Physical activity G. Advance directives H. List of other  physicians I.  Hospitalizations, surgeries, and ER visits in previous 12 months J.  [place] such as hearing and vision if needed, cognitive and depression L. Referrals and appointments   In addition, I have reviewed and discussed with patient certain preventive protocols, quality metrics, and best practice recommendations. A written personalized care plan for preventive services as well as general preventive health recommendations were provided to patient.   Signed,  Tyler Aas, LPN Nurse Health Advisor   Nurse Notes: none

## 2018-09-15 NOTE — Patient Instructions (Addendum)
[name] , Thank you for taking time to come for your Medicare Wellness Visit. I appreciate your ongoing commitment to your health goals. Please review the following plan we discussed and let me know if I can assist you in the future.   Screening recommendations/referrals: Colonoscopy: completed 05/12/2018 Recommended yearly ophthalmology/optometry visit for glaucoma screening and checkup Recommended yearly dental visit for hygiene and checkup  Vaccinations: Influenza vaccine: due now- done today  Pneumococcal vaccine: due at age 53 Tdap vaccine: up to date Shingles vaccine: shingrix eligible, check with your insurance company for coverage    Advanced directives: copy on file, please let us know if you want to make any changes  Conditions/risks identified: keep up the good work with not smoking and/or drinking.   Next appointment: Follow up in one year for your annual wellness exam.   Preventive Care 40-64 Years, Male Preventive care refers to lifestyle choices and visits with your health care provider that can promote health and wellness. What does preventive care include?  A yearly physical exam. This is also called an annual well check.  Dental exams once or twice a year.  Routine eye exams. Ask your health care provider how often you should have your eyes checked.  Personal lifestyle choices, including:  Daily care of your teeth and gums.  Regular physical activity.  Eating a healthy diet.  Avoiding tobacco and drug use.  Limiting alcohol use.  Practicing safe sex.  Taking low-dose aspirin every day starting at age 42. What happens during an annual well check? The services and screenings done by your health care provider during your annual well check will depend on your age, overall health, lifestyle risk factors, and family history of disease. Counseling  Your health care provider may ask you questions about your:  Alcohol use.  Tobacco use.  Drug  use.  Emotional well-being.  Home and relationship well-being.  Sexual activity.  Eating habits.  Work and work Statistician. Screening  You may have the following tests or measurements:  Height, weight, and BMI.  Blood pressure.  Lipid and cholesterol levels. These may be checked every 5 years, or more frequently if you are over 85 years old.  Skin check.  Lung cancer screening. You may have this screening every year starting at age 38 if you have a 30-pack-year history of smoking and currently smoke or have quit within the past 15 years.  Fecal occult blood test (FOBT) of the stool. You may have this test every year starting at age 51.  Flexible sigmoidoscopy or colonoscopy. You may have a sigmoidoscopy every 5 years or a colonoscopy every 10 years starting at age 14.  Prostate cancer screening. Recommendations will vary depending on your family history and other risks.  Hepatitis C blood test.  Hepatitis B blood test.  Sexually transmitted disease (STD) testing.  Diabetes screening. This is done by checking your blood sugar (glucose) after you have not eaten for a while (fasting). You may have this done every 1-3 years. Discuss your test results, treatment options, and if necessary, the need for more tests with your health care provider. Vaccines  Your health care provider may recommend certain vaccines, such as:  Influenza vaccine. This is recommended every year.  Tetanus, diphtheria, and acellular pertussis (Tdap, Td) vaccine. You may need a Td booster every 10 years.  Zoster vaccine. You may need this after age 65.  Pneumococcal 13-valent conjugate (PCV13) vaccine. You may need this if you have certain conditions and  have not been vaccinated.  Pneumococcal polysaccharide (PPSV23) vaccine. You may need one or two doses if you smoke cigarettes or if you have certain conditions. Talk to your health care provider about which screenings and vaccines you need and how  often you need them. This information is not intended to replace advice given to you by your health care provider. Make sure you discuss any questions you have with your health care provider. Document Released: 08/16/2015 Document Revised: 04/08/2016 Document Reviewed: 05/21/2015 Elsevier Interactive Patient Education  2017 [place] Prevention in the Home Falls can cause injuries. They can happen to people of all ages. There are many things you can do to make your home safe and to help prevent falls. What can I do on the outside of my home?  Regularly fix the edges of walkways and driveways and fix any cracks.  Remove anything that might make you trip as you walk through a door, such as a raised step or threshold.  Trim any bushes or trees on the path to your home.  Use bright outdoor lighting.  Clear any walking paths of anything that might make someone trip, such as rocks or tools.  Regularly check to see if handrails are loose or broken. Make sure that both sides of any steps have handrails.  Any raised decks and porches should have guardrails on the edges.  Have any leaves, snow, or ice cleared regularly.  Use sand or salt on walking paths during winter.  Clean up any spills in your garage right away. This includes oil or grease spills. What can I do in the bathroom?  Use night lights.  Install grab bars by the toilet and in the tub and shower. Do not use towel bars as grab bars.  Use non-skid mats or decals in the tub or shower.  If you need to sit down in the shower, use a plastic, non-slip stool.  Keep the floor dry. Clean up any water that spills on the floor as soon as it happens.  Remove soap buildup in the tub or shower regularly.  Attach bath mats securely with double-sided non-slip rug tape.  Do not have throw rugs and other things on the floor that can make you trip. What can I do in the bedroom?  Use night lights.  Make sure that you have a  light by your bed that is easy to reach.  Do not use any sheets or blankets that are too big for your bed. They should not hang down onto the floor.  Have a firm chair that has side arms. You can use this for support while you get dressed.  Do not have throw rugs and other things on the floor that can make you trip. What can I do in the kitchen?  Clean up any spills right away.  Avoid walking on wet floors.  Keep items that you use a lot in easy-to-reach places.  If you need to reach something above you, use a strong step stool that has a grab bar.  Keep electrical cords out of the way.  Do not use floor polish or wax that makes floors slippery. If you must use wax, use non-skid floor wax.  Do not have throw rugs and other things on the floor that can make you trip. What can I do with my stairs?  Do not leave any items on the stairs.  Make sure that there are handrails on both sides of the stairs and  use them. Fix handrails that are broken or loose. Make sure that handrails are as long as the stairways.  Check any carpeting to make sure that it is firmly attached to the stairs. Fix any carpet that is loose or worn.  Avoid having throw rugs at the top or bottom of the stairs. If you do have throw rugs, attach them to the floor with carpet tape.  Make sure that you have a light switch at the top of the stairs and the bottom of the stairs. If you do not have them, ask someone to add them for you. What else can I do to help prevent falls?  Wear shoes that:  Do not have high heels.  Have rubber bottoms.  Are comfortable and fit you well.  Are closed at the toe. Do not wear sandals.  If you use a stepladder:  Make sure that it is fully opened. Do not climb a closed stepladder.  Make sure that both sides of the stepladder are locked into place.  Ask someone to hold it for you, if possible.  Clearly mark and make sure that you can see:  Any grab bars or  handrails.  First and last steps.  Where the edge of each step is.  Use tools that help you move around (mobility aids) if they are needed. These include:  Canes.  Walkers.  Scooters.  Crutches.  Turn on the lights when you go into a dark area. Replace any light bulbs as soon as they burn out.  Set up your furniture so you have a clear path. Avoid moving your furniture around.  If any of your floors are uneven, fix them.  If there are any pets around you, be aware of where they are.  Review your medicines with your doctor. Some medicines can make you feel dizzy. This can increase your chance of falling. Ask your doctor what other things that you can do to help prevent falls. This information is not intended to replace advice given to you by your health care provider. Make sure you discuss any questions you have with your health care provider. Document Released: 05/16/2009 Document Revised: 12/26/2015 Document Reviewed: 08/24/2014 Elsevier Interactive Patient Education  2017 [employer].

## 2018-09-16 DIAGNOSIS — D649 Anemia, unspecified: Secondary | ICD-10-CM | POA: Diagnosis not present

## 2018-09-16 DIAGNOSIS — E785 Hyperlipidemia, unspecified: Secondary | ICD-10-CM | POA: Diagnosis not present

## 2018-09-16 DIAGNOSIS — I10 Essential (primary) hypertension: Secondary | ICD-10-CM | POA: Diagnosis not present

## 2018-09-16 DIAGNOSIS — R911 Solitary pulmonary nodule: Secondary | ICD-10-CM | POA: Diagnosis not present

## 2018-09-16 DIAGNOSIS — E1152 Type 2 diabetes mellitus with diabetic peripheral angiopathy with gangrene: Secondary | ICD-10-CM | POA: Diagnosis not present

## 2018-09-16 DIAGNOSIS — I69351 Hemiplegia and hemiparesis following cerebral infarction affecting right dominant side: Secondary | ICD-10-CM | POA: Diagnosis not present

## 2018-09-16 DIAGNOSIS — Z89511 Acquired absence of right leg below knee: Secondary | ICD-10-CM | POA: Diagnosis not present

## 2018-09-16 DIAGNOSIS — I480 Paroxysmal atrial fibrillation: Secondary | ICD-10-CM | POA: Diagnosis not present

## 2018-09-16 DIAGNOSIS — Z4781 Encounter for orthopedic aftercare following surgical amputation: Secondary | ICD-10-CM | POA: Diagnosis not present

## 2018-09-16 DIAGNOSIS — I77810 Thoracic aortic ectasia: Secondary | ICD-10-CM | POA: Diagnosis not present

## 2018-09-19 DIAGNOSIS — R69 Illness, unspecified: Secondary | ICD-10-CM | POA: Diagnosis not present

## 2018-09-19 DIAGNOSIS — D649 Anemia, unspecified: Secondary | ICD-10-CM | POA: Diagnosis not present

## 2018-09-19 DIAGNOSIS — I480 Paroxysmal atrial fibrillation: Secondary | ICD-10-CM | POA: Diagnosis not present

## 2018-09-19 DIAGNOSIS — I10 Essential (primary) hypertension: Secondary | ICD-10-CM | POA: Diagnosis not present

## 2018-09-19 DIAGNOSIS — I69351 Hemiplegia and hemiparesis following cerebral infarction affecting right dominant side: Secondary | ICD-10-CM | POA: Diagnosis not present

## 2018-09-19 DIAGNOSIS — I77810 Thoracic aortic ectasia: Secondary | ICD-10-CM | POA: Diagnosis not present

## 2018-09-19 DIAGNOSIS — Z4781 Encounter for orthopedic aftercare following surgical amputation: Secondary | ICD-10-CM | POA: Diagnosis not present

## 2018-09-19 DIAGNOSIS — E785 Hyperlipidemia, unspecified: Secondary | ICD-10-CM | POA: Diagnosis not present

## 2018-09-19 DIAGNOSIS — Z89511 Acquired absence of right leg below knee: Secondary | ICD-10-CM | POA: Diagnosis not present

## 2018-09-19 DIAGNOSIS — E1152 Type 2 diabetes mellitus with diabetic peripheral angiopathy with gangrene: Secondary | ICD-10-CM | POA: Diagnosis not present

## 2018-09-19 DIAGNOSIS — R911 Solitary pulmonary nodule: Secondary | ICD-10-CM | POA: Diagnosis not present

## 2018-09-20 ENCOUNTER — Ambulatory Visit: Payer: Medicare HMO | Admitting: Family Medicine

## 2018-09-21 ENCOUNTER — Telehealth: Payer: Self-pay | Admitting: Family Medicine

## 2018-09-21 DIAGNOSIS — I480 Paroxysmal atrial fibrillation: Secondary | ICD-10-CM | POA: Diagnosis not present

## 2018-09-21 DIAGNOSIS — R911 Solitary pulmonary nodule: Secondary | ICD-10-CM | POA: Diagnosis not present

## 2018-09-21 DIAGNOSIS — E1152 Type 2 diabetes mellitus with diabetic peripheral angiopathy with gangrene: Secondary | ICD-10-CM | POA: Diagnosis not present

## 2018-09-21 DIAGNOSIS — Z89511 Acquired absence of right leg below knee: Secondary | ICD-10-CM | POA: Diagnosis not present

## 2018-09-21 DIAGNOSIS — I10 Essential (primary) hypertension: Secondary | ICD-10-CM | POA: Diagnosis not present

## 2018-09-21 DIAGNOSIS — Z4781 Encounter for orthopedic aftercare following surgical amputation: Secondary | ICD-10-CM | POA: Diagnosis not present

## 2018-09-21 DIAGNOSIS — I69351 Hemiplegia and hemiparesis following cerebral infarction affecting right dominant side: Secondary | ICD-10-CM | POA: Diagnosis not present

## 2018-09-21 DIAGNOSIS — I77810 Thoracic aortic ectasia: Secondary | ICD-10-CM | POA: Diagnosis not present

## 2018-09-21 DIAGNOSIS — D649 Anemia, unspecified: Secondary | ICD-10-CM | POA: Diagnosis not present

## 2018-09-21 DIAGNOSIS — E785 Hyperlipidemia, unspecified: Secondary | ICD-10-CM | POA: Diagnosis not present

## 2018-09-21 NOTE — Telephone Encounter (Signed)
Pt returned call see notes in referral

## 2018-09-21 NOTE — Telephone Encounter (Signed)
° ° °  Called Pt & lm regarding Community Resource Referral.   [place]  ??[email or web address]  ?? [phone]   Skype

## 2018-09-23 ENCOUNTER — Ambulatory Visit (INDEPENDENT_AMBULATORY_CARE_PROVIDER_SITE_OTHER): Payer: Self-pay | Admitting: Vascular Surgery

## 2018-09-23 DIAGNOSIS — R911 Solitary pulmonary nodule: Secondary | ICD-10-CM | POA: Diagnosis not present

## 2018-09-23 DIAGNOSIS — Z4781 Encounter for orthopedic aftercare following surgical amputation: Secondary | ICD-10-CM | POA: Diagnosis not present

## 2018-09-23 DIAGNOSIS — I10 Essential (primary) hypertension: Secondary | ICD-10-CM | POA: Diagnosis not present

## 2018-09-23 DIAGNOSIS — I77810 Thoracic aortic ectasia: Secondary | ICD-10-CM | POA: Diagnosis not present

## 2018-09-23 DIAGNOSIS — Z89511 Acquired absence of right leg below knee: Secondary | ICD-10-CM | POA: Diagnosis not present

## 2018-09-23 DIAGNOSIS — D649 Anemia, unspecified: Secondary | ICD-10-CM | POA: Diagnosis not present

## 2018-09-23 DIAGNOSIS — I480 Paroxysmal atrial fibrillation: Secondary | ICD-10-CM | POA: Diagnosis not present

## 2018-09-23 DIAGNOSIS — E785 Hyperlipidemia, unspecified: Secondary | ICD-10-CM | POA: Diagnosis not present

## 2018-09-23 DIAGNOSIS — E1152 Type 2 diabetes mellitus with diabetic peripheral angiopathy with gangrene: Secondary | ICD-10-CM | POA: Diagnosis not present

## 2018-09-23 DIAGNOSIS — I69351 Hemiplegia and hemiparesis following cerebral infarction affecting right dominant side: Secondary | ICD-10-CM | POA: Diagnosis not present

## 2018-09-26 ENCOUNTER — Encounter: Payer: Self-pay | Admitting: Family Medicine

## 2018-09-26 ENCOUNTER — Other Ambulatory Visit: Payer: Self-pay

## 2018-09-26 ENCOUNTER — Ambulatory Visit (INDEPENDENT_AMBULATORY_CARE_PROVIDER_SITE_OTHER): Payer: Medicare HMO | Admitting: Family Medicine

## 2018-09-26 VITALS — BP 109/66 | HR 97 | Temp 97.9°F

## 2018-09-26 DIAGNOSIS — E1151 Type 2 diabetes mellitus with diabetic peripheral angiopathy without gangrene: Secondary | ICD-10-CM

## 2018-09-26 DIAGNOSIS — Z4781 Encounter for orthopedic aftercare following surgical amputation: Secondary | ICD-10-CM | POA: Diagnosis not present

## 2018-09-26 DIAGNOSIS — Z89511 Acquired absence of right leg below knee: Secondary | ICD-10-CM | POA: Diagnosis not present

## 2018-09-26 DIAGNOSIS — D649 Anemia, unspecified: Secondary | ICD-10-CM | POA: Diagnosis not present

## 2018-09-26 DIAGNOSIS — I480 Paroxysmal atrial fibrillation: Secondary | ICD-10-CM

## 2018-09-26 DIAGNOSIS — I10 Essential (primary) hypertension: Secondary | ICD-10-CM | POA: Diagnosis not present

## 2018-09-26 DIAGNOSIS — R69 Illness, unspecified: Secondary | ICD-10-CM | POA: Diagnosis not present

## 2018-09-26 DIAGNOSIS — G8191 Hemiplegia, unspecified affecting right dominant side: Secondary | ICD-10-CM

## 2018-09-26 DIAGNOSIS — I739 Peripheral vascular disease, unspecified: Secondary | ICD-10-CM | POA: Diagnosis not present

## 2018-09-26 DIAGNOSIS — I77810 Thoracic aortic ectasia: Secondary | ICD-10-CM | POA: Diagnosis not present

## 2018-09-26 DIAGNOSIS — E559 Vitamin D deficiency, unspecified: Secondary | ICD-10-CM

## 2018-09-26 DIAGNOSIS — I129 Hypertensive chronic kidney disease with stage 1 through stage 4 chronic kidney disease, or unspecified chronic kidney disease: Secondary | ICD-10-CM | POA: Diagnosis not present

## 2018-09-26 DIAGNOSIS — D500 Iron deficiency anemia secondary to blood loss (chronic): Secondary | ICD-10-CM

## 2018-09-26 DIAGNOSIS — E1142 Type 2 diabetes mellitus with diabetic polyneuropathy: Secondary | ICD-10-CM

## 2018-09-26 DIAGNOSIS — I69351 Hemiplegia and hemiparesis following cerebral infarction affecting right dominant side: Secondary | ICD-10-CM | POA: Diagnosis not present

## 2018-09-26 DIAGNOSIS — E782 Mixed hyperlipidemia: Secondary | ICD-10-CM | POA: Diagnosis not present

## 2018-09-26 DIAGNOSIS — R911 Solitary pulmonary nodule: Secondary | ICD-10-CM | POA: Diagnosis not present

## 2018-09-26 DIAGNOSIS — E785 Hyperlipidemia, unspecified: Secondary | ICD-10-CM | POA: Diagnosis not present

## 2018-09-26 DIAGNOSIS — E1152 Type 2 diabetes mellitus with diabetic peripheral angiopathy with gangrene: Secondary | ICD-10-CM | POA: Diagnosis not present

## 2018-09-26 DIAGNOSIS — G621 Alcoholic polyneuropathy: Secondary | ICD-10-CM

## 2018-09-26 DIAGNOSIS — I70209 Unspecified atherosclerosis of native arteries of extremities, unspecified extremity: Secondary | ICD-10-CM

## 2018-09-26 LAB — MICROALBUMIN, URINE WAIVED
Creatinine, Urine Waived: 50 mg/dL (ref 10–300)
Microalb, Ur Waived: 10 mg/L (ref 0–19)
Microalb/Creat Ratio: <30 mg/g (ref <30)

## 2018-09-26 LAB — BAYER DCA HB A1C WAIVED: HB A1C (BAYER DCA - WAIVED): 6.9 % (ref <7.0)

## 2018-09-26 LAB — GLUCOSE HEMOCUE WAIVED: Glu Hemocue Waived: 123 mg/dL — ABNORMAL HIGH (ref 65–99)

## 2018-09-26 MED ORDER — APIXABAN 5 MG PO TABS: 5.0000 mg | ORAL_TABLET | Freq: Two times a day (BID) | ORAL | 1 refills | Status: DC

## 2018-09-26 MED ORDER — ONETOUCH DELICA LANCETS 33G MISC: 12 refills | Status: DC

## 2018-09-26 MED ORDER — ONETOUCH ULTRA BLUE VI STRP: ORAL_STRIP | 12 refills | Status: DC

## 2018-09-26 MED ORDER — EMPAGLIFLOZIN 10 MG PO TABS: 5.0000 mg | ORAL_TABLET | Freq: Every day | ORAL | 1 refills | Status: DC

## 2018-09-26 MED ORDER — HYDROCHLOROTHIAZIDE 12.5 MG PO CAPS: 12.5000 mg | ORAL_CAPSULE | Freq: Every day | ORAL | 1 refills | Status: DC

## 2018-09-26 MED ORDER — GABAPENTIN 800 MG PO TABS: 800.0000 mg | ORAL_TABLET | Freq: Three times a day (TID) | ORAL | 1 refills | Status: DC

## 2018-09-26 NOTE — Assessment & Plan Note (Signed)
Under good control on current regimen. Continue current regimen. Continue to monitor. Call with any concerns. Refills given. Labs checked today.  

## 2018-09-26 NOTE — Progress Notes (Signed)
BP 109/66   Pulse 97   Temp 97.9 F (36.6 C) (Oral)   SpO2 97%    Subjective:    Patient ID: Glenn Gerold., male    DOB: 05-Sep-1955, 63 y.o.   MRN: [id number]  HPI: Glenn Dolata. is a 63 y.o. male  Chief Complaint  Patient presents with  . Diabetes   DIABETES Hypoglycemic episodes:no Polydipsia/polyuria: no Visual disturbance: no Chest pain: no Paresthesias: yes Glucose Monitoring: yes  Accucheck frequency: BID Taking Insulin?: no Blood Pressure Monitoring: not checking Retinal Examination: Not up to Date Foot Exam: Up to Date Diabetic Education: Completed Pneumovax: Up to Date Influenza: Up to Date Aspirin: yes  HYPERTENSION / HYPERLIPIDEMIA Satisfied with current treatment? yes Duration of hypertension: chronic BP monitoring frequency: not checking BP medication side effects: no Past BP meds: HCTZ, metoprolol Duration of hyperlipidemia: chronic Cholesterol medication side effects: no Cholesterol supplements: none Past cholesterol medications: simvastatin Medication compliance: excellent compliance Aspirin: yes Recent stressors: no Recurrent headaches: no Visual changes: no Palpitations: no Dyspnea: no Chest pain: no Lower extremity edema: no Dizzy/lightheaded: no  ANEMIA Anemia status: controlled Etiology of anemia: iron deficiency Duration of anemia treatment: chronic Compliance with treatment: excellent compliance Iron supplementation side effects: no Severity of anemia: severe Fatigue: yes Decreased exercise tolerance: yes  Dyspnea on exertion: no Palpitations: no Bleeding: no Pica: no   Relevant past medical, surgical, family and social history reviewed and updated as indicated. Interim medical history since our last visit reviewed. Allergies and medications reviewed and updated.  Review of Systems  Constitutional: Negative.   Respiratory: Negative.   Cardiovascular: Negative.   Musculoskeletal: Negative.     Psychiatric/Behavioral: Negative.     Per HPI unless specifically indicated above     Objective:    BP 109/66   Pulse 97   Temp 97.9 F (36.6 C) (Oral)   SpO2 97%   Wt Readings from Last 3 Encounters:  09/15/18 183 lb 9.6 oz (83.3 kg)  09/14/18 182 lb (82.6 kg)  08/22/18 180 lb (81.6 kg)    Physical Exam Vitals signs and nursing note reviewed.  Constitutional:      General: He is not in acute distress.    Appearance: Normal appearance. He is not ill-appearing, toxic-appearing or diaphoretic.  HENT:     Head: Normocephalic and atraumatic.     Right Ear: External ear normal.     Left Ear: External ear normal.     Nose: Nose normal.     Mouth/Throat:     Mouth: Mucous membranes are moist.     Pharynx: Oropharynx is clear.  Eyes:     General: No scleral icterus.       Right eye: No discharge.        Left eye: No discharge.     Extraocular Movements: Extraocular movements intact.     Conjunctiva/sclera: Conjunctivae normal.     Pupils: Pupils are equal, round, and reactive to light.  Neck:     Musculoskeletal: Normal range of motion and neck supple.  Cardiovascular:     Rate and Rhythm: Normal rate and regular rhythm.     Pulses: Normal pulses.     Heart sounds: Normal heart sounds. No murmur. No friction rub. No gallop.   Pulmonary:     Effort: Pulmonary effort is normal. No respiratory distress.     Breath sounds: Normal breath sounds. No stridor. No wheezing, rhonchi or rales.  Chest:     Chest wall:  No tenderness.  Musculoskeletal: Normal range of motion.     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 Healing well.   Skin:    General: Skin is warm and dry.     Capillary Refill: Capillary refill takes less than 2 seconds.     Coloration: Skin is not jaundiced or pale.     Findings: No bruising, erythema, lesion or rash.  Neurological:     General: No focal deficit present.     Mental Status: He is alert and oriented to person, place, and time.  Mental status is at baseline.  Psychiatric:        Mood and Affect: Mood normal.        Behavior: Behavior normal.        Judgment: Judgment normal.     Results for orders placed or performed in visit on 09/13/18  Ferritin  Result Value Ref Range   Ferritin 28 24 - 336 ng/mL  Iron and TIBC  Result Value Ref Range   Iron 39 (L) 45 - 182 ug/dL   TIBC 450 250 - 450 ug/dL   Saturation Ratios 9 (L) 17.9 - 39.5 %   UIBC 411 ug/dL  CBC with Differential/Platelet  Result Value Ref Range   WBC 5.7 4.0 - 10.5 K/uL   RBC 5.34 4.22 - 5.81 MIL/uL   Hemoglobin 13.2 13.0 - 17.0 g/dL   HCT 43.0 39.0 - 52.0 %   MCV 80.5 80.0 - 100.0 fL   MCH 24.7 (L) 26.0 - 34.0 pg   MCHC 30.7 30.0 - 36.0 g/dL   RDW 15.6 (H) 11.5 - 15.5 %   Platelets 186 150 - 400 K/uL   nRBC 0.0 0.0 - 0.2 %   Neutrophils Relative % 61 %   Neutro Abs 3.4 1.7 - 7.7 K/uL   Lymphocytes Relative 25 %   Lymphs Abs 1.4 0.7 - 4.0 K/uL   Monocytes Relative 9 %   Monocytes Absolute 0.5 0.1 - 1.0 K/uL   Eosinophils Relative 4 %   Eosinophils Absolute 0.2 0.0 - 0.5 K/uL   Basophils Relative 1 %   Basophils Absolute 0.0 0.0 - 0.1 K/uL   Immature Granulocytes 0 %   Abs Immature Granulocytes 0.01 0.00 - 0.07 K/uL      Assessment & Plan:   Problem List Items Addressed This Visit      Cardiovascular and Mediastinum   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 Continue to monitor. Call with any concerns.       Relevant Medications   apixaban (ELIQUIS) 5 MG TABS tablet   empagliflozin (JARDIANCE) 10 MG TABS tablet   hydrochlorothiazide (MICROZIDE) 12.5 MG capsule   PVD (peripheral vascular disease) ([place])    Will continue to follow with vascular. Continue medication. Call with any concerns.       Relevant Medications   apixaban (ELIQUIS) 5 MG TABS tablet   hydrochlorothiazide (MICROZIDE) 12.5 MG capsule   Other Relevant Orders   Comprehensive metabolic panel   A-fib ([name])     In NSR rhythm today. Call with any concerns.       Relevant Medications   apixaban (ELIQUIS) 5 MG TABS tablet   hydrochlorothiazide (MICROZIDE) 12.5 MG capsule     Endocrine   Diabetic peripheral neuropathy ([name]) - Primary    A1c 6.9 today. Continue to monitor. Call with any concerns.       Relevant Medications  empagliflozin (JARDIANCE) 10 MG TABS tablet   gabapentin (NEURONTIN) 800 MG tablet   Other Relevant Orders   Bayer DCA Hb A1c Waived   Comprehensive metabolic panel   Glucose Hemocue Waived   Microalbumin, Urine Waived     Nervous and Auditory   Alcoholic peripheral neuropathy ([name]) (Chronic)    Stable. Continue current regimen. Continue to monitor. Call with any concerns.       Relevant Medications   gabapentin (NEURONTIN) 800 MG tablet   Acute right hemiparesis ([name])    Stable. Continue current regimen. Continue to monitor. Call with any concerns.         Genitourinary   Benign hypertensive renal disease    Under good control on current regimen. Continue current regimen. Continue to monitor. Call with any concerns. Refills given. Labs drawn today.         Other   Hyperlipidemia    Under good control on current regimen. Continue current regimen. Continue to monitor. Call with any concerns. Refills given. Labs checked today.       Relevant Medications   apixaban (ELIQUIS) 5 MG TABS tablet   hydrochlorothiazide (MICROZIDE) 12.5 MG capsule   Other Relevant Orders   Comprehensive metabolic panel   Lipid Panel w/o Chol/HDL Ratio   Iron deficiency anemia    Rechecking levels today. Await results. Continue to follow with hematology as needed.       Relevant Orders   CBC with Differential/Platelet   Comprehensive metabolic panel   Vitamin D insufficiency    Labs drawn today. Await results. Call with any concerns.       Relevant Orders   Comprehensive metabolic panel   VITAMIN D 25 Hydroxy (Vit-D Deficiency, Fractures)       Follow up  plan: Return in about 3 months (around 12/25/2018).

## 2018-09-26 NOTE — Assessment & Plan Note (Signed)
A1c 6.9 today. Continue to monitor. Call with any concerns.

## 2018-09-26 NOTE — Assessment & Plan Note (Signed)
Rechecking levels today. Await results. Continue to follow with hematology as needed.

## 2018-09-26 NOTE — Assessment & Plan Note (Signed)
Stable. Continue current regimen. Continue to monitor. Call with any concerns.  

## 2018-09-26 NOTE — Assessment & Plan Note (Signed)
Will continue to follow with vascular. Continue medication. Call with any concerns.

## 2018-09-26 NOTE — Assessment & Plan Note (Signed)
In NSR rhythm today. Call with any concerns.

## 2018-09-26 NOTE — Assessment & Plan Note (Signed)
Labs drawn today. Await results. Call with any concerns.  

## 2018-09-26 NOTE — Assessment & Plan Note (Signed)
Under good control on current regimen. Continue current regimen. Continue to monitor. Call with any concerns. Refills given. Labs drawn today.   

## 2018-09-27 DIAGNOSIS — E1152 Type 2 diabetes mellitus with diabetic peripheral angiopathy with gangrene: Secondary | ICD-10-CM | POA: Diagnosis not present

## 2018-09-27 DIAGNOSIS — I77810 Thoracic aortic ectasia: Secondary | ICD-10-CM | POA: Diagnosis not present

## 2018-09-27 DIAGNOSIS — E785 Hyperlipidemia, unspecified: Secondary | ICD-10-CM | POA: Diagnosis not present

## 2018-09-27 DIAGNOSIS — I480 Paroxysmal atrial fibrillation: Secondary | ICD-10-CM | POA: Diagnosis not present

## 2018-09-27 DIAGNOSIS — D649 Anemia, unspecified: Secondary | ICD-10-CM | POA: Diagnosis not present

## 2018-09-27 DIAGNOSIS — R911 Solitary pulmonary nodule: Secondary | ICD-10-CM | POA: Diagnosis not present

## 2018-09-27 DIAGNOSIS — Z89511 Acquired absence of right leg below knee: Secondary | ICD-10-CM | POA: Diagnosis not present

## 2018-09-27 DIAGNOSIS — Z4781 Encounter for orthopedic aftercare following surgical amputation: Secondary | ICD-10-CM | POA: Diagnosis not present

## 2018-09-27 DIAGNOSIS — I69351 Hemiplegia and hemiparesis following cerebral infarction affecting right dominant side: Secondary | ICD-10-CM | POA: Diagnosis not present

## 2018-09-27 DIAGNOSIS — I10 Essential (primary) hypertension: Secondary | ICD-10-CM | POA: Diagnosis not present

## 2018-09-27 LAB — CBC WITH DIFFERENTIAL/PLATELET
Basophils Absolute: 0.1 10*3/uL (ref 0.0–0.2)
Basos: 1 %
EOS (ABSOLUTE): 0.3 10*3/uL (ref 0.0–0.4)
Eos: 4 %
Hematocrit: 44.6 % (ref 37.5–51.0)
Hemoglobin: 14.2 g/dL (ref 13.0–17.7)
Immature Grans (Abs): 0 10*3/uL (ref 0.0–0.1)
Immature Granulocytes: 0 %
Lymphocytes Absolute: 1.5 10*3/uL (ref 0.7–3.1)
Lymphs: 20 %
MCH: 25.8 pg — ABNORMAL LOW (ref 26.6–33.0)
MCHC: 31.8 g/dL (ref 31.5–35.7)
MCV: 81 fL (ref 79–97)
MONOS ABS: 0.5 10*3/uL (ref 0.1–0.9)
Monocytes: 7 %
Neutrophils Absolute: 4.9 10*3/uL (ref 1.4–7.0)
Neutrophils: 68 %
Platelets: 288 10*3/uL (ref 150–450)
RBC: 5.5 x10E6/uL (ref 4.14–5.80)
RDW: 15.9 % — ABNORMAL HIGH (ref 11.6–15.4)
WBC: 7.3 10*3/uL (ref 3.4–10.8)

## 2018-09-27 LAB — LIPID PANEL W/O CHOL/HDL RATIO
Cholesterol, Total: 116 mg/dL (ref 100–199)
HDL: 51 mg/dL (ref >39)
LDL Calculated: 41 mg/dL (ref 0–99)
Triglycerides: 119 mg/dL (ref 0–149)
VLDL CHOLESTEROL CAL: 24 mg/dL (ref 5–40)

## 2018-09-27 LAB — COMPREHENSIVE METABOLIC PANEL
ALBUMIN: 4.8 g/dL (ref 3.8–4.8)
ALT: 31 IU/L (ref 0–44)
AST: 26 IU/L (ref 0–40)
Albumin/Globulin Ratio: 2 (ref 1.2–2.2)
Alkaline Phosphatase: 88 IU/L (ref 39–117)
BUN / CREAT RATIO: 21 (ref 10–24)
BUN: 16 mg/dL (ref 8–27)
Bilirubin Total: 0.7 mg/dL (ref 0.0–1.2)
CO2: 26 mmol/L (ref 20–29)
Calcium: 10.8 mg/dL — ABNORMAL HIGH (ref 8.6–10.2)
Chloride: 99 mmol/L (ref 96–106)
Creatinine, Ser: 0.77 mg/dL (ref 0.76–1.27)
GFR calc Af Amer: 112 mL/min/{1.73_m2} (ref >59)
GFR calc non Af Amer: 97 mL/min/{1.73_m2} (ref >59)
Globulin, Total: 2.4 g/dL (ref 1.5–4.5)
Glucose: 126 mg/dL — ABNORMAL HIGH (ref 65–99)
Potassium: 4.5 mmol/L (ref 3.5–5.2)
Sodium: 144 mmol/L (ref 134–144)
TOTAL PROTEIN: 7.2 g/dL (ref 6.0–8.5)

## 2018-09-27 LAB — VITAMIN D 25 HYDROXY (VIT D DEFICIENCY, FRACTURES): Vit D, 25-Hydroxy: 41.7 ng/mL (ref 30.0–100.0)

## 2018-09-28 DIAGNOSIS — T8189XA Other complications of procedures, not elsewhere classified, initial encounter: Secondary | ICD-10-CM | POA: Diagnosis not present

## 2018-09-29 NOTE — Progress Notes (Signed)
Cardiology Office Note  Date:  10/03/2018   ID:  [name]., DOB April 27, 1956, MRN [id number]  PCP:  [name], DO   Chief Complaint  Patient presents with  . Other    6 month follow up. patient denies chest pain and SOB at this time. Meds reviewed verbally with patient.     HPI:  [name] is a 63 y.o.male patient who has a history of  Stroke in 2016, with right homonymous hemianopsia  cortical infarct in left precentral gyrus and left medial parietal lobe Recurrent stroke  November 2018 Paroxysmal atrial fibrillation PAD, Followed by Dr. 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 He feels well overall.  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 He is in a wheelchair due to his leg amputation   He will be getting a prosthesis soon. Normally, he can tell whenever he goes into Afib, however, he is currently in Afib, but does not feel it at this time  Whenever he checks his blood pressure at home it is pretty low  Denies significant leg swelling  He is still taking aspirin and Eliquis. He has gained weight recently and has gone up to weight he was before his leg amputation.  No regular exercise   Still going to get his iron infusion, but he wants to stop doing them.   He is compliant with his Vytorin which has kept his cholesterol low.   He quit smoking 6 months ago. Also quit drinking recently, but drinks a beer every once in a while.  His diet is low in sugars and has changed to eating wheat bread.   He does have a sore on his right leg stump, several centimeters Waiting for this to heal before he gets a  prosthesis  Blood pressure 120/66 Total Chol 116/ LDL 41 HBA1C 6.9 CR 0.77 Glucose 126  EKG personally reviewed by myself on todays visit Shows Atrial fibrillation rhythm. 87 bpm. Consider septal infarct, age undetermined. Abnormal ECG.   OTHER PAST MEDICAL HISTORY REVIEWED BY ME FOR TODAY'S VISIT: Stress test performed showing ejection fraction 50% no ischemia Echocardiogram August 2016 essentially normal with mild MR ejection fraction 55-60% Holter but results are unavailable Carotid ultrasound 2016 with mild plaque  Repeat testing November 2018 Echocardiogram with normal ejection fraction 55-65% Carotid ultrasound less than 50% bilaterally  CT scan runoff/aorta November 2018 1. 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 2. Tapered appearance of the right peroneal artery as it approaches the ankle without abrupt occlusion. Patent right dorsalis pedis. 3. Normal left three-vessel runoff to the level of the ankle. 4. 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 5.  Aortic Atherosclerosis   DM HBA1C 6.6  Total chol 151 LDL 67  EKG Jun 21 2017: atrial fib   PMH:   has a past medical history of Acute left PCA stroke ([place]) (02/17/2015), Anemia, Atherosclerosis of native arteries of the extremities with gangrene ([place]) (05/08/2018), Atrial fibrillation ([place]), Blind, Diabetes  mellitus with complication ([place]), Dilated aortic root ([place]), Dysrhythmia, Gangrene of right foot ([place]) (06/02/2018), GERD (gastroesophageal reflux disease), History of echocardiogram, History of hernia repair, History of stress test, Hyperlipidemia, Hypertension, Leg pain, Legally blind, Necrotic toes ([place]) (02/21/2018), PAD (peripheral artery disease) ([place]), PAF (paroxysmal atrial fibrillation) ([place]), Peripheral  vascular disease ([place]), Pulmonary nodules, Stroke ([place]), and Stroke ([place]) (07/11/2018).  PSH:    Past Surgical History:  Procedure Laterality Date  . AMPUTATION Right 06/02/2018   Procedure: AMPUTATION BELOW KNEE;  Surgeon: [name], MD;  Location: [facility] ORS;  Service: Vascular;  Laterality: Right;  . APPENDECTOMY    . COLONOSCOPY WITH PROPOFOL N/A 05/12/2018   Procedure: COLONOSCOPY WITH PROPOFOL;  Surgeon: [name], MD;  Location: [facility] ENDOSCOPY;  Service: Gastroenterology;  Laterality: N/A;  . ESOPHAGOGASTRODUODENOSCOPY (EGD) WITH PROPOFOL N/A 05/12/2018   Procedure: ESOPHAGOGASTRODUODENOSCOPY (EGD) WITH PROPOFOL;  Surgeon: [name], MD;  Location: [facility] ENDOSCOPY;  Service: Gastroenterology;  Laterality: N/A;  . GIVENS CAPSULE STUDY N/A 07/13/2018   Procedure: GIVENS CAPSULE STUDY;  Surgeon: [name], MD;  Location: [facility] ENDOSCOPY;  Service: Gastroenterology;  Laterality: N/A;  . HERNIA REPAIR     UMBILICAL  . LOWER EXTREMITY ANGIOGRAPHY Right 10/25/2017   Procedure: LOWER EXTREMITY ANGIOGRAPHY;  Surgeon: [name], MD;  Location: [place] CV LAB;  Service: Cardiovascular;  Laterality: Right;  . LOWER EXTREMITY ANGIOGRAPHY Right 01/13/2018   Procedure: LOWER EXTREMITY ANGIOGRAPHY;  Surgeon: [name], MD;  Location: [place] CV LAB;  Service: Cardiovascular;  Laterality: Right;  . LOWER EXTREMITY INTERVENTION  10/25/2017   Procedure: LOWER EXTREMITY INTERVENTION;  Surgeon: [name], MD;  Location: [place] CV LAB;  Service: Cardiovascular;;  . TONSILLECTOMY    . WOUND DEBRIDEMENT Right 08/08/2018   Procedure: DEBRIDEMENT WOUND WITH WOUND VAC APPLICATION;  Surgeon: [name], MD;  Location: [facility] ORS;  Service: Vascular;  Laterality: Right;    Current Outpatient Medications  Medication Sig Dispense Refill  . acetaminophen (TYLENOL) 500 MG tablet Take 1,000 mg by mouth every 6 (six) hours as needed (for pain.).    Marland Kitchen apixaban (ELIQUIS) 5 MG TABS tablet  Take 1 tablet (5 mg total) by mouth 2 (two) times daily. 180 tablet 1  . aspirin 81 MG EC tablet Take 1 tablet (81 mg total) by mouth daily. 90 tablet 4  . Biotin (BIOTIN 5000) 5 MG CAPS Take 1 capsule by mouth daily.    . Calcium Carb-Cholecalciferol (CALCIUM 600-D PO) Take by mouth daily.    . cholecalciferol (VITAMIN D3) 25 MCG (1000 UT) tablet Take 5,000 Units by mouth daily.     Marland Kitchen donepezil (ARICEPT) 5 MG tablet Take 5 mg by mouth at bedtime.    . empagliflozin (JARDIANCE) 10 MG TABS tablet Take 5 mg by mouth daily. 90 tablet 1  . ezetimibe-simvastatin (VYTORIN) 10-40 MG tablet Take 1 tablet by mouth daily at 6 PM. 90 tablet 3  . ferrous sulfate (FERROUSUL) 325 (65 FE) MG tablet Take 1 tablet (325 mg total) by mouth daily with breakfast. 90 tablet 3  . gabapentin (NEURONTIN) 800 MG tablet Take 1 tablet (800 mg total) by mouth 3 (three) times daily. 270 tablet 1  . hydrochlorothiazide (MICROZIDE) 12.5 MG capsule Take 1 capsule (12.5 mg total) by mouth daily. 90 capsule 1  . lansoprazole (PREVACID) 30 MG capsule Take 30 mg by mouth daily at 12 noon.    . metFORMIN (GLUCOPHAGE) 1000 MG tablet Take 1 tablet (1,000 mg total)  by mouth 2 (two) times daily with a meal. 180 tablet 3  . metoprolol tartrate (LOPRESSOR) 25 MG tablet Take 1 tablet (25 mg total) by mouth 2 (two) times daily. 180 tablet 3  . Multiple Vitamin (MULTIVITAMIN WITH MINERALS) TABS tablet Take 1 tablet by mouth daily. 30 tablet 0  . nystatin (MYCOSTATIN/NYSTOP) powder Apply topically 4 (four) times daily. 15 g 0  . ONE TOUCH ULTRA TEST test strip USE TO TEST BLOOD SUGAR BID 100 each 12  . ONETOUCH DELICA LANCETS 50D MISC USE AS DIRECTED TO TEST BLOOD SUGAR BID 100 each 12  . traMADol (ULTRAM) 50 MG tablet Take 1 tablet (50 mg total) by mouth every 6 (six) hours as needed. 30 tablet 0  . vitamin B-12 (CYANOCOBALAMIN) 1000 MCG tablet Take 1,000 mcg by mouth daily.    . vitamin C (ASCORBIC ACID) 500 MG tablet Take 500 mg by mouth  daily.     No current facility-administered medications for this visit.      Allergies:   Sodium pentobarbital [pentobarbital] and Lipitor [atorvastatin]   Social History:  The patient  reports that he quit smoking about 8 months ago. His smoking use included cigarettes. He has a 30.00 pack-year smoking history. He has never used smokeless tobacco. He reports that he does not drink alcohol or use drugs.   Family History:   family history includes Diabetes in his father; Hyperlipidemia in his father; Hypertension in his father.    Review of Systems: Review of Systems  Constitutional: Negative.   Eyes: Negative.   Respiratory: Negative.   Cardiovascular: Negative.   Gastrointestinal: Negative.   Genitourinary: Negative.   Musculoskeletal: Negative.   Neurological: Negative.   Psychiatric/Behavioral: Negative.   All other systems reviewed and are negative.    PHYSICAL EXAM: VS:  BP 120/66 (BP Location: Right Arm, Patient Position: Sitting, Cuff Size: Normal)   Pulse 87   Ht 5\' 10"  (1.778 m)   Wt 185 lb (83.9 kg)   BMI 26.54 kg/m  , BMI Body mass index is 26.54 kg/m.   Constitutional:  oriented to person, place, and time. No distress.  HENT:  Head: Grossly normal Eyes:  no discharge. No scleral icterus.  Neck: No JVD, no carotid bruits  Cardiovascular: Regular rate and rhythm, no murmurs appreciated  Pulmonary/Chest: Clear to auscultation bilaterally, no wheezes or rales Abdominal: Soft.  no distension.  no tenderness.  Musculoskeletal: Normal range of motion Neurological:  normal muscle tone. Coordination normal. No atrophy Skin: Skin warm and dry Psychiatric: normal affect, pleasant     Recent Labs: 04/06/2018: TSH 2.480 07/11/2018: Magnesium 1.9 09/26/2018: ALT 31; BUN 16; Creatinine, Ser 0.77; Hemoglobin 14.2; Platelets 288; Potassium 4.5; Sodium 144    Lipid Panel Lab Results  Component Value Date   CHOL 116 09/26/2018   HDL 51 09/26/2018   LDLCALC 41  09/26/2018   TRIG 119 09/26/2018      Wt Readings from Last 3 Encounters:  10/03/18 185 lb (83.9 kg)  09/15/18 183 lb 9.6 oz (83.3 kg)  09/14/18 182 lb (82.6 kg)       ASSESSMENT AND PLAN:  Paroxysmal atrial fibrillation ([name]) Plan: EKG 12-Lead On Eliquis 5 twice daily Documentation of paroxysmal atrial fibrillation, EKG November 2018 May have contributed to stroke in 2016 In atrial fibrillation on today's visit, asymptomatic No strong indication to restore normal sinus rhythm  PVD (peripheral vascular disease) ([name]) Plan: Recent PV intervention by Dr. 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 EKG 12-Lead   I, Jesus Reyes am acting as a scribe for Ida Rogue, M.D., Ph.D.  I, Ida Rogue, M.D. Ph.D., have reviewed the above documentation for accuracy and completeness, and I agree with the above.   Signed, Esmond Plants, M.D., Ph.D. 10/03/2018  [place], [place]

## 2018-09-30 DIAGNOSIS — D649 Anemia, unspecified: Secondary | ICD-10-CM | POA: Diagnosis not present

## 2018-09-30 DIAGNOSIS — R911 Solitary pulmonary nodule: Secondary | ICD-10-CM | POA: Diagnosis not present

## 2018-09-30 DIAGNOSIS — Z89511 Acquired absence of right leg below knee: Secondary | ICD-10-CM | POA: Diagnosis not present

## 2018-09-30 DIAGNOSIS — I480 Paroxysmal atrial fibrillation: Secondary | ICD-10-CM | POA: Diagnosis not present

## 2018-09-30 DIAGNOSIS — Z4781 Encounter for orthopedic aftercare following surgical amputation: Secondary | ICD-10-CM | POA: Diagnosis not present

## 2018-09-30 DIAGNOSIS — I77810 Thoracic aortic ectasia: Secondary | ICD-10-CM | POA: Diagnosis not present

## 2018-09-30 DIAGNOSIS — E785 Hyperlipidemia, unspecified: Secondary | ICD-10-CM | POA: Diagnosis not present

## 2018-09-30 DIAGNOSIS — I69351 Hemiplegia and hemiparesis following cerebral infarction affecting right dominant side: Secondary | ICD-10-CM | POA: Diagnosis not present

## 2018-09-30 DIAGNOSIS — E1152 Type 2 diabetes mellitus with diabetic peripheral angiopathy with gangrene: Secondary | ICD-10-CM | POA: Diagnosis not present

## 2018-09-30 DIAGNOSIS — I10 Essential (primary) hypertension: Secondary | ICD-10-CM | POA: Diagnosis not present

## 2018-10-03 ENCOUNTER — Encounter: Payer: Self-pay | Admitting: Cardiovascular Disease

## 2018-10-03 ENCOUNTER — Ambulatory Visit: Payer: Medicare HMO | Admitting: Cardiovascular Disease

## 2018-10-03 VITALS — BP 120/66 | HR 87 | Ht 70.0 in | Wt 185.0 lb

## 2018-10-03 DIAGNOSIS — Z4781 Encounter for orthopedic aftercare following surgical amputation: Secondary | ICD-10-CM | POA: Diagnosis not present

## 2018-10-03 DIAGNOSIS — E1151 Type 2 diabetes mellitus with diabetic peripheral angiopathy without gangrene: Secondary | ICD-10-CM

## 2018-10-03 DIAGNOSIS — D649 Anemia, unspecified: Secondary | ICD-10-CM | POA: Diagnosis not present

## 2018-10-03 DIAGNOSIS — I70209 Unspecified atherosclerosis of native arteries of extremities, unspecified extremity: Secondary | ICD-10-CM

## 2018-10-03 DIAGNOSIS — R911 Solitary pulmonary nodule: Secondary | ICD-10-CM | POA: Diagnosis not present

## 2018-10-03 DIAGNOSIS — I77810 Thoracic aortic ectasia: Secondary | ICD-10-CM | POA: Diagnosis not present

## 2018-10-03 DIAGNOSIS — I480 Paroxysmal atrial fibrillation: Secondary | ICD-10-CM

## 2018-10-03 DIAGNOSIS — E785 Hyperlipidemia, unspecified: Secondary | ICD-10-CM | POA: Diagnosis not present

## 2018-10-03 DIAGNOSIS — I10 Essential (primary) hypertension: Secondary | ICD-10-CM | POA: Diagnosis not present

## 2018-10-03 DIAGNOSIS — E1152 Type 2 diabetes mellitus with diabetic peripheral angiopathy with gangrene: Secondary | ICD-10-CM | POA: Diagnosis not present

## 2018-10-03 DIAGNOSIS — I69351 Hemiplegia and hemiparesis following cerebral infarction affecting right dominant side: Secondary | ICD-10-CM | POA: Diagnosis not present

## 2018-10-03 DIAGNOSIS — Z89511 Acquired absence of right leg below knee: Secondary | ICD-10-CM | POA: Diagnosis not present

## 2018-10-03 NOTE — Patient Instructions (Addendum)
Medication Instructions:  Please take the HCTZ daily as needed for leg swelling  If you need a refill on your cardiac medications before your next appointment, please call your pharmacy.    Lab work: No new labs needed   If you have labs (blood work) drawn today and your tests are completely normal, you will receive your results only by: Marland Kitchen MyChart Message (if you have MyChart) OR . A paper copy in the mail If you have any lab test that is abnormal or we need to change your treatment, we will call you to review the results.   Testing/Procedures: No new testing needed   Follow-Up: At [facility], you and your health needs are our priority.  As part of our continuing mission to provide you with exceptional heart care, we have created designated Provider Care Teams.  These Care Teams include your primary Cardiologist (physician) and Advanced Practice Providers (APPs -  Physician Assistants and Nurse Practitioners) who all work together to provide you with the care you need, when you need it.  . You will need a follow up appointment in 12 months .   Please call our office 2 months in advance to schedule this appointment.    . Providers on your designated Care Team:   . Murray Hodgkins, NP . Christell Faith, PA-C . Marrianne Mood, PA-C  Any Other Special Instructions Will Be Listed Below (If Applicable).  For educational health videos Log in to : [email or web address] Or : [email or web address], password : [email or web address]

## 2018-10-04 ENCOUNTER — Ambulatory Visit (INDEPENDENT_AMBULATORY_CARE_PROVIDER_SITE_OTHER): Payer: Self-pay | Admitting: Vascular Surgery

## 2018-10-04 DIAGNOSIS — I69351 Hemiplegia and hemiparesis following cerebral infarction affecting right dominant side: Secondary | ICD-10-CM | POA: Diagnosis not present

## 2018-10-04 DIAGNOSIS — I10 Essential (primary) hypertension: Secondary | ICD-10-CM | POA: Diagnosis not present

## 2018-10-04 DIAGNOSIS — R911 Solitary pulmonary nodule: Secondary | ICD-10-CM | POA: Diagnosis not present

## 2018-10-04 DIAGNOSIS — Z89511 Acquired absence of right leg below knee: Secondary | ICD-10-CM | POA: Diagnosis not present

## 2018-10-04 DIAGNOSIS — I77810 Thoracic aortic ectasia: Secondary | ICD-10-CM | POA: Diagnosis not present

## 2018-10-04 DIAGNOSIS — Z4781 Encounter for orthopedic aftercare following surgical amputation: Secondary | ICD-10-CM | POA: Diagnosis not present

## 2018-10-04 DIAGNOSIS — I480 Paroxysmal atrial fibrillation: Secondary | ICD-10-CM | POA: Diagnosis not present

## 2018-10-04 DIAGNOSIS — E1152 Type 2 diabetes mellitus with diabetic peripheral angiopathy with gangrene: Secondary | ICD-10-CM | POA: Diagnosis not present

## 2018-10-04 DIAGNOSIS — E785 Hyperlipidemia, unspecified: Secondary | ICD-10-CM | POA: Diagnosis not present

## 2018-10-04 DIAGNOSIS — D649 Anemia, unspecified: Secondary | ICD-10-CM | POA: Diagnosis not present

## 2018-10-04 NOTE — Telephone Encounter (Signed)
Lm to follow up on home visit [facility] scheduled for next Thursday

## 2018-10-05 ENCOUNTER — Telehealth (INDEPENDENT_AMBULATORY_CARE_PROVIDER_SITE_OTHER): Payer: Self-pay | Admitting: Vascular Surgery

## 2018-10-05 DIAGNOSIS — T8189XA Other complications of procedures, not elsewhere classified, initial encounter: Secondary | ICD-10-CM | POA: Diagnosis not present

## 2018-10-05 DIAGNOSIS — E785 Hyperlipidemia, unspecified: Secondary | ICD-10-CM | POA: Diagnosis not present

## 2018-10-05 DIAGNOSIS — I77810 Thoracic aortic ectasia: Secondary | ICD-10-CM | POA: Diagnosis not present

## 2018-10-05 DIAGNOSIS — I480 Paroxysmal atrial fibrillation: Secondary | ICD-10-CM | POA: Diagnosis not present

## 2018-10-05 DIAGNOSIS — Z89511 Acquired absence of right leg below knee: Secondary | ICD-10-CM | POA: Diagnosis not present

## 2018-10-05 DIAGNOSIS — Z4781 Encounter for orthopedic aftercare following surgical amputation: Secondary | ICD-10-CM | POA: Diagnosis not present

## 2018-10-05 DIAGNOSIS — D649 Anemia, unspecified: Secondary | ICD-10-CM | POA: Diagnosis not present

## 2018-10-05 DIAGNOSIS — I69351 Hemiplegia and hemiparesis following cerebral infarction affecting right dominant side: Secondary | ICD-10-CM | POA: Diagnosis not present

## 2018-10-05 DIAGNOSIS — R911 Solitary pulmonary nodule: Secondary | ICD-10-CM | POA: Diagnosis not present

## 2018-10-05 DIAGNOSIS — I10 Essential (primary) hypertension: Secondary | ICD-10-CM | POA: Diagnosis not present

## 2018-10-05 DIAGNOSIS — E1152 Type 2 diabetes mellitus with diabetic peripheral angiopathy with gangrene: Secondary | ICD-10-CM | POA: Diagnosis not present

## 2018-10-05 NOTE — Telephone Encounter (Signed)
Amy from [facility] calling requesting verbal order to continue speech therapy on patient. Requesting 1 visit per week for 3 weeks starting 10/10/18. Please advise. AS, CMA

## 2018-10-05 NOTE — Telephone Encounter (Signed)
Sure.  Sometimes they want to hear from the MD. So let the know the verbal order is from me, per Dr. Lucky Cowboy.

## 2018-10-05 NOTE — Telephone Encounter (Signed)
Left message on Amy confidential voicemail. AS, CMA

## 2018-10-07 ENCOUNTER — Encounter (INDEPENDENT_AMBULATORY_CARE_PROVIDER_SITE_OTHER): Payer: Self-pay | Admitting: Nurse Practitioner

## 2018-10-07 ENCOUNTER — Other Ambulatory Visit: Payer: Self-pay

## 2018-10-07 ENCOUNTER — Ambulatory Visit (INDEPENDENT_AMBULATORY_CARE_PROVIDER_SITE_OTHER): Payer: Medicare HMO | Admitting: Nurse Practitioner

## 2018-10-07 VITALS — BP 114/78 | HR 96 | Resp 10 | Ht 70.0 in | Wt 185.0 lb

## 2018-10-07 DIAGNOSIS — Z89511 Acquired absence of right leg below knee: Secondary | ICD-10-CM

## 2018-10-07 DIAGNOSIS — Z79899 Other long term (current) drug therapy: Secondary | ICD-10-CM | POA: Diagnosis not present

## 2018-10-07 DIAGNOSIS — Z87891 Personal history of nicotine dependence: Secondary | ICD-10-CM

## 2018-10-07 DIAGNOSIS — I70209 Unspecified atherosclerosis of native arteries of extremities, unspecified extremity: Secondary | ICD-10-CM

## 2018-10-07 DIAGNOSIS — Z7901 Long term (current) use of anticoagulants: Secondary | ICD-10-CM

## 2018-10-07 DIAGNOSIS — E1151 Type 2 diabetes mellitus with diabetic peripheral angiopathy without gangrene: Secondary | ICD-10-CM | POA: Diagnosis not present

## 2018-10-07 DIAGNOSIS — I480 Paroxysmal atrial fibrillation: Secondary | ICD-10-CM | POA: Diagnosis not present

## 2018-10-07 MED ORDER — TRAMADOL HCL 50 MG PO TABS: ORAL_TABLET | ORAL | 0 refills | Status: DC

## 2018-10-08 DIAGNOSIS — Z89511 Acquired absence of right leg below knee: Secondary | ICD-10-CM | POA: Diagnosis not present

## 2018-10-08 DIAGNOSIS — I96 Gangrene, not elsewhere classified: Secondary | ICD-10-CM | POA: Diagnosis not present

## 2018-10-09 ENCOUNTER — Encounter (INDEPENDENT_AMBULATORY_CARE_PROVIDER_SITE_OTHER): Payer: Self-pay | Admitting: Nurse Practitioner

## 2018-10-09 NOTE — Progress Notes (Signed)
SUBJECTIVE:  Patient ID: Glenn Gerold., male    DOB: 09/06/1955, 63 y.o.   MRN: [id number] Chief Complaint  Patient presents with  . Follow-up    HPI  Glenn Mcfann. is a 63 y.o. male that presents today for evaluation of his right lower extremity stump.  Previously the stump was pale, pink without bleeding.  Today it is red, beefy and bleeding very well.  Patient's wife states that this happened shortly after changing some of his hypertension medications.  She stated that after 1 of his medications was scheduled to as needed his stump began to have increased bleeding and drainage.  This was approximately a week ago. There is increased granulation tissue near the edges.    He denies any fever, chills, nausea and vomiting.  He denies chest pain or short ness of breathe.   Past Medical History:  Diagnosis Date  . Acute left PCA stroke ([place]) 02/17/2015  . Anemia   . Atherosclerosis of native arteries of the extremities with gangrene ([place]) 05/08/2018  . Atrial fibrillation ([place])   . Blind    right eye  . Diabetes mellitus with complication ([place])   . Dilated aortic root ([place])    a. 04/2018 Echo: 4.1cm. Asc Ao 3.5cm.  Marland Kitchen Dysrhythmia   . Gangrene of right foot ([place]) 06/02/2018  . GERD (gastroesophageal reflux disease)   . History of echocardiogram    a. 04/2018 Echo: Ef 60-65%, no rwma, midly to mod dil Ao root - 4.1cm. Asc Ao 3.5cm. Mild MR. Nl RV fxn. Nl PASP.  Marland Kitchen History of hernia repair   . History of stress test    a. 2016 MV ([name]): EF 58%, no ischemia.  . Hyperlipidemia   . Hypertension   . Leg pain   . Legally blind   . Necrotic toes ([place]) 02/21/2018  . PAD (peripheral artery disease) ([place])   . PAF (paroxysmal atrial fibrillation) ([name])    a. on Eliquis as of 2018; b. CHADS2VASc => 5 (HTN, DM, stroke x 2, vascular disease)  . Peripheral vascular disease ([place])    a. followed by Dr. Lucky Cowboy; b. s/p kissing balloon stents and right external iliac stent in 10/2017; c. LE  angiogrpahy 01/2018: No significant arterial occlusive disease in the lower extremities.  . Pulmonary nodules   . Stroke [facility])    a. 2016 & 2018  . Stroke [facility]) 07/11/2018    Past Surgical History:  Procedure Laterality Date  . AMPUTATION Right 06/02/2018   Procedure: AMPUTATION BELOW KNEE;  Surgeon: [name], MD;  Location: [facility] ORS;  Service: Vascular;  Laterality: Right;  . APPENDECTOMY    . COLONOSCOPY WITH PROPOFOL N/A 05/12/2018   Procedure: COLONOSCOPY WITH PROPOFOL;  Surgeon: [name], MD;  Location: [facility] ENDOSCOPY;  Service: Gastroenterology;  Laterality: N/A;  . ESOPHAGOGASTRODUODENOSCOPY (EGD) WITH PROPOFOL N/A 05/12/2018   Procedure: ESOPHAGOGASTRODUODENOSCOPY (EGD) WITH PROPOFOL;  Surgeon: [name], MD;  Location[facility] ENDOSCOPY;  Service: Gastroenterology;  Laterality: N/A;  . GIVENS CAPSULE STUDY N/A 07/13/2018   Procedure: GIVENS CAPSULE STUDY;  Surgeon: [name], MD;  Location: [facility] ENDOSCOPY;  Service: Gastroenterology;  Laterality: N/A;  . HERNIA REPAIR     UMBILICAL  . LOWER EXTREMITY ANGIOGRAPHY Right 10/25/2017   Procedure: LOWER EXTREMITY ANGIOGRAPHY;  Surgeon: [name], MD;  Location: [place] CV LAB;  Service: Cardiovascular;  Laterality: Right;  . LOWER EXTREMITY ANGIOGRAPHY Right 01/13/2018   Procedure: LOWER EXTREMITY ANGIOGRAPHY;  Surgeon: Leotis Pain  S, MD;  Location: [place] CV LAB;  Service: Cardiovascular;  Laterality: Right;  . LOWER EXTREMITY INTERVENTION  10/25/2017   Procedure: LOWER EXTREMITY INTERVENTION;  Surgeon: [name], MD;  Location: [place] CV LAB;  Service: Cardiovascular;;  . TONSILLECTOMY    . WOUND DEBRIDEMENT Right 08/08/2018   Procedure: DEBRIDEMENT WOUND WITH WOUND VAC APPLICATION;  Surgeon: [name], MD;  Location: [facility] ORS;  Service: Vascular;  Laterality: Right;    Social History   Socioeconomic History  . Marital status: Married    Spouse name: Not on file  . Number of children: 0  . Years  of education: Not on file  . Highest education level: Associate degree: academic program  Occupational History  . Not on file  Social Needs  . Financial resource strain: Somewhat hard  . Food insecurity:    Worry: Never true    Inability: Never true  . Transportation needs:    Medical: No    Non-medical: No  Tobacco Use  . Smoking status: Former Smoker    Packs/day: 1.00    Years: 30.00    Pack years: 30.00    Types: Cigarettes    Last attempt to quit: 01/09/2018    Years since quitting: 0.7  . Smokeless tobacco: Never Used  Substance and Sexual Activity  . Alcohol use: Never    Alcohol/week: 0.0 standard drinks    Frequency: Never    Comment: occasionally beer   . Drug use: No  . Sexual activity: Yes  Lifestyle  . Physical activity:    Days per week: 0 days    Minutes per session: 0 min  . Stress: Not at all  Relationships  . Social connections:    Talks on phone: More than three times a week    Gets together: Never    Attends religious service: Never    Active member of club or organization: No    Attends meetings of clubs or organizations: Never    Relationship status: Married  . Intimate partner violence:    Fear of current or ex partner: No    Emotionally abused: No    Physically abused: No    Forced sexual activity: No  Other Topics Concern  . Not on file  Social History Narrative  . Not on file    Family History  Problem Relation Age of Onset  . Diabetes Father   . Hypertension Father   . Hyperlipidemia Father     Allergies  Allergen Reactions  . Sodium Pentobarbital [Pentobarbital] Shortness Of Breath  . Lipitor [Atorvastatin] Rash     Review of Systems   Review of Systems: Negative Unless Checked Constitutional: [] Weight loss  [] Fever  [] Chills Cardiac: [] Chest pain   [x]  Atrial Fibrillation  [] Palpitations   [] Shortness of breath when laying flat   [] Shortness of breath with exertion. [] Shortness of breath at rest Vascular:  [] Pain in  legs with walking   [] Pain in legs with standing [] Pain in legs when laying flat   [] Claudication    [] Pain in feet when laying flat    [] History of DVT   [] Phlebitis   [] Swelling in legs   [] Varicose veins   [] Non-healing ulcers Pulmonary:   [] Uses home oxygen   [] Productive cough   [] Hemoptysis   [] Wheeze  [] COPD   [] Asthma Neurologic:  [] Dizziness   [] Seizures  [] Blackouts [x] History of stroke   [] History of TIA  [] Aphasia   [] Temporary Blindness   [] Weakness or numbness  in arm   [x] Weakness or numbness in leg Musculoskeletal:   [] Joint swelling   [] Joint pain   [] Low back pain  []  History of Knee Replacement [] Arthritis [] back Surgeries  []  Spinal Stenosis    Hematologic:  [] Easy bruising  [] Easy bleeding   [] Hypercoagulable state   [x] Anemic Gastrointestinal:  [] Diarrhea   [] Vomiting  [x] Gastroesophageal reflux/heartburn   [] Difficulty swallowing. [] Abdominal pain Genitourinary:  [] Chronic kidney disease   [] Difficult urination  [] Anuric   [] Blood in urine [] Frequent urination  [] Burning with urination   [] Hematuria Skin:  [] Rashes   [] Ulcers [x] Wounds Psychological:  [] History of anxiety   []  History of major depression  [x]  Memory Difficulties      OBJECTIVE:   Physical Exam  BP 114/78 (BP Location: Left Arm, Patient Position: Sitting, Cuff Size: Small)   Pulse 96   Resp 10   Ht 5\' 10"  (1.778 m)   Wt 185 lb (83.9 kg)   BMI 26.54 kg/m   Gen: WD/WN, NAD Head: [place]/AT, No temporalis wasting.  Ear/Nose/Throat: Hearing grossly intact, nares w/o erythema or drainage Eyes: PER, EOMI, sclera nonicteric.  Neck: Supple, no masses.  No JVD.  Pulmonary:  Good air movement, no use of accessory muscles.  Cardiac: RRR Vascular: red, beefy, well bleeding stump Vessel Right Left  Radial Palpable Palpable   Gastrointestinal: soft, non-distended. No guarding/no peritoneal signs.  Musculoskeletal: M/S 5/5 throughout.  right BKA  Neurologic: Pain and light touch intact in extremities.   Symmetrical.  Speech is fluent. Motor exam as listed above. Psychiatric: Judgment intact, Mood & affect appropriate for pt's clinical situation. Dermatologic: No Venous rashes. No Ulcers Noted.  No changes consistent with cellulitis. Lymph : No Cervical lymphadenopathy, no lichenification or skin changes of chronic lymphedema.       ASSESSMENT AND PLAN:  1. Hx of BKA, right ([place]) The patient has a well-healing stump that is beefy red and healing.  There is an ample amount of bleeding leading it is just that he had ample blood flow for the heel.  The patient currently still has a wound VAC placed and we will continue with wound VAC therapy.  I believe that changing of his hypertension medication may have increased wound healing as his blood pressure was previously low before, possibly increasing vasoconstriction with him to stop.  We will have the patient return in 1 month to evaluate wound healing.  2. 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  Hgb A1C to be monitored as already arranged by primary service   3. Paroxysmal atrial fibrillation ([name]) Continue antiarrhythmia medications as already ordered, these medications have been reviewed and there are no changes at this time.  Continue anticoagulation as ordered by Cardiology Service    Current Outpatient Medications on File Prior to Visit  Medication Sig Dispense Refill  . acetaminophen (TYLENOL) 500 MG tablet Take 1,000 mg by mouth every 6 (six) hours as needed (for pain.).    Marland Kitchen apixaban (ELIQUIS) 5 MG TABS tablet Take 1 tablet (5 mg total) by mouth 2 (two) times daily. 180 tablet 1  . aspirin 81 MG EC tablet Take 1 tablet (81 mg total) by mouth daily. 90 tablet 4  . Biotin (BIOTIN 5000) 5 MG CAPS Take 1 capsule by mouth daily.    . Calcium Carb-Cholecalciferol (CALCIUM 600-D  PO) Take by mouth daily.    Marland Kitchen  cholecalciferol (VITAMIN D3) 25 MCG (1000 UT) tablet Take 5,000 Units by mouth daily.     Marland Kitchen donepezil (ARICEPT) 5 MG tablet Take 5 mg by mouth at bedtime.    . empagliflozin (JARDIANCE) 10 MG TABS tablet Take 5 mg by mouth daily. 90 tablet 1  . ezetimibe-simvastatin (VYTORIN) 10-40 MG tablet Take 1 tablet by mouth daily at 6 PM. 90 tablet 3  . ferrous sulfate (FERROUSUL) 325 (65 FE) MG tablet Take 1 tablet (325 mg total) by mouth daily with breakfast. 90 tablet 3  . gabapentin (NEURONTIN) 800 MG tablet Take 1 tablet (800 mg total) by mouth 3 (three) times daily. 270 tablet 1  . hydrochlorothiazide (MICROZIDE) 12.5 MG capsule Take 1 capsule (12.5 mg total) by mouth daily. (Patient taking differently: Take 12.5 mg by mouth daily. PRN) 90 capsule 1  . lansoprazole (PREVACID) 30 MG capsule Take 30 mg by mouth daily at 12 noon.    . metFORMIN (GLUCOPHAGE) 1000 MG tablet Take 1 tablet (1,000 mg total) by mouth 2 (two) times daily with a meal. 180 tablet 3  . metoprolol tartrate (LOPRESSOR) 25 MG tablet Take 1 tablet (25 mg total) by mouth 2 (two) times daily. 180 tablet 3  . Multiple Vitamin (MULTIVITAMIN WITH MINERALS) TABS tablet Take 1 tablet by mouth daily. 30 tablet 0  . nystatin (MYCOSTATIN/NYSTOP) powder Apply topically 4 (four) times daily. 15 g 0  . ONE TOUCH ULTRA TEST test strip USE TO TEST BLOOD SUGAR BID 100 each 12  . ONETOUCH DELICA LANCETS 18H MISC USE AS DIRECTED TO TEST BLOOD SUGAR BID 100 each 12  . vitamin B-12 (CYANOCOBALAMIN) 1000 MCG tablet Take 1,000 mcg by mouth daily.    . vitamin C (ASCORBIC ACID) 500 MG tablet Take 500 mg by mouth daily.     No current facility-administered medications on file prior to visit.     There are no Patient Instructions on file for this visit. No follow-ups on file.   Glenn Hartmann, NP  This note was completed with Sales executive.  Any errors are purely unintentional.

## 2018-10-10 DIAGNOSIS — R911 Solitary pulmonary nodule: Secondary | ICD-10-CM | POA: Diagnosis not present

## 2018-10-10 DIAGNOSIS — I69351 Hemiplegia and hemiparesis following cerebral infarction affecting right dominant side: Secondary | ICD-10-CM | POA: Diagnosis not present

## 2018-10-10 DIAGNOSIS — I10 Essential (primary) hypertension: Secondary | ICD-10-CM | POA: Diagnosis not present

## 2018-10-10 DIAGNOSIS — Z89511 Acquired absence of right leg below knee: Secondary | ICD-10-CM | POA: Diagnosis not present

## 2018-10-10 DIAGNOSIS — E785 Hyperlipidemia, unspecified: Secondary | ICD-10-CM | POA: Diagnosis not present

## 2018-10-10 DIAGNOSIS — I480 Paroxysmal atrial fibrillation: Secondary | ICD-10-CM | POA: Diagnosis not present

## 2018-10-10 DIAGNOSIS — I77810 Thoracic aortic ectasia: Secondary | ICD-10-CM | POA: Diagnosis not present

## 2018-10-10 DIAGNOSIS — D649 Anemia, unspecified: Secondary | ICD-10-CM | POA: Diagnosis not present

## 2018-10-10 DIAGNOSIS — E1152 Type 2 diabetes mellitus with diabetic peripheral angiopathy with gangrene: Secondary | ICD-10-CM | POA: Diagnosis not present

## 2018-10-10 DIAGNOSIS — Z4781 Encounter for orthopedic aftercare following surgical amputation: Secondary | ICD-10-CM | POA: Diagnosis not present

## 2018-10-11 DIAGNOSIS — I69351 Hemiplegia and hemiparesis following cerebral infarction affecting right dominant side: Secondary | ICD-10-CM | POA: Diagnosis not present

## 2018-10-11 DIAGNOSIS — Z4781 Encounter for orthopedic aftercare following surgical amputation: Secondary | ICD-10-CM | POA: Diagnosis not present

## 2018-10-11 DIAGNOSIS — Z89511 Acquired absence of right leg below knee: Secondary | ICD-10-CM | POA: Diagnosis not present

## 2018-10-11 DIAGNOSIS — I77810 Thoracic aortic ectasia: Secondary | ICD-10-CM | POA: Diagnosis not present

## 2018-10-11 DIAGNOSIS — I10 Essential (primary) hypertension: Secondary | ICD-10-CM | POA: Diagnosis not present

## 2018-10-11 DIAGNOSIS — E785 Hyperlipidemia, unspecified: Secondary | ICD-10-CM | POA: Diagnosis not present

## 2018-10-11 DIAGNOSIS — R911 Solitary pulmonary nodule: Secondary | ICD-10-CM | POA: Diagnosis not present

## 2018-10-11 DIAGNOSIS — I480 Paroxysmal atrial fibrillation: Secondary | ICD-10-CM | POA: Diagnosis not present

## 2018-10-11 DIAGNOSIS — D649 Anemia, unspecified: Secondary | ICD-10-CM | POA: Diagnosis not present

## 2018-10-11 DIAGNOSIS — E1152 Type 2 diabetes mellitus with diabetic peripheral angiopathy with gangrene: Secondary | ICD-10-CM | POA: Diagnosis not present

## 2018-10-12 DIAGNOSIS — D649 Anemia, unspecified: Secondary | ICD-10-CM | POA: Diagnosis not present

## 2018-10-12 DIAGNOSIS — E785 Hyperlipidemia, unspecified: Secondary | ICD-10-CM | POA: Diagnosis not present

## 2018-10-12 DIAGNOSIS — I10 Essential (primary) hypertension: Secondary | ICD-10-CM | POA: Diagnosis not present

## 2018-10-12 DIAGNOSIS — E1152 Type 2 diabetes mellitus with diabetic peripheral angiopathy with gangrene: Secondary | ICD-10-CM | POA: Diagnosis not present

## 2018-10-12 DIAGNOSIS — R911 Solitary pulmonary nodule: Secondary | ICD-10-CM | POA: Diagnosis not present

## 2018-10-12 DIAGNOSIS — Z89511 Acquired absence of right leg below knee: Secondary | ICD-10-CM | POA: Diagnosis not present

## 2018-10-12 DIAGNOSIS — I69351 Hemiplegia and hemiparesis following cerebral infarction affecting right dominant side: Secondary | ICD-10-CM | POA: Diagnosis not present

## 2018-10-12 DIAGNOSIS — I480 Paroxysmal atrial fibrillation: Secondary | ICD-10-CM | POA: Diagnosis not present

## 2018-10-12 DIAGNOSIS — I77810 Thoracic aortic ectasia: Secondary | ICD-10-CM | POA: Diagnosis not present

## 2018-10-12 DIAGNOSIS — Z4781 Encounter for orthopedic aftercare following surgical amputation: Secondary | ICD-10-CM | POA: Diagnosis not present

## 2018-10-14 DIAGNOSIS — R911 Solitary pulmonary nodule: Secondary | ICD-10-CM | POA: Diagnosis not present

## 2018-10-14 DIAGNOSIS — E785 Hyperlipidemia, unspecified: Secondary | ICD-10-CM | POA: Diagnosis not present

## 2018-10-14 DIAGNOSIS — I77810 Thoracic aortic ectasia: Secondary | ICD-10-CM | POA: Diagnosis not present

## 2018-10-14 DIAGNOSIS — Z4781 Encounter for orthopedic aftercare following surgical amputation: Secondary | ICD-10-CM | POA: Diagnosis not present

## 2018-10-14 DIAGNOSIS — I10 Essential (primary) hypertension: Secondary | ICD-10-CM | POA: Diagnosis not present

## 2018-10-14 DIAGNOSIS — I480 Paroxysmal atrial fibrillation: Secondary | ICD-10-CM | POA: Diagnosis not present

## 2018-10-14 DIAGNOSIS — I69351 Hemiplegia and hemiparesis following cerebral infarction affecting right dominant side: Secondary | ICD-10-CM | POA: Diagnosis not present

## 2018-10-14 DIAGNOSIS — E1152 Type 2 diabetes mellitus with diabetic peripheral angiopathy with gangrene: Secondary | ICD-10-CM | POA: Diagnosis not present

## 2018-10-14 DIAGNOSIS — Z89511 Acquired absence of right leg below knee: Secondary | ICD-10-CM | POA: Diagnosis not present

## 2018-10-14 DIAGNOSIS — D649 Anemia, unspecified: Secondary | ICD-10-CM | POA: Diagnosis not present

## 2018-10-17 ENCOUNTER — Telehealth (INDEPENDENT_AMBULATORY_CARE_PROVIDER_SITE_OTHER): Payer: Self-pay

## 2018-10-17 DIAGNOSIS — E785 Hyperlipidemia, unspecified: Secondary | ICD-10-CM | POA: Diagnosis not present

## 2018-10-17 DIAGNOSIS — I69351 Hemiplegia and hemiparesis following cerebral infarction affecting right dominant side: Secondary | ICD-10-CM | POA: Diagnosis not present

## 2018-10-17 DIAGNOSIS — I10 Essential (primary) hypertension: Secondary | ICD-10-CM | POA: Diagnosis not present

## 2018-10-17 DIAGNOSIS — I480 Paroxysmal atrial fibrillation: Secondary | ICD-10-CM | POA: Diagnosis not present

## 2018-10-17 DIAGNOSIS — D649 Anemia, unspecified: Secondary | ICD-10-CM | POA: Diagnosis not present

## 2018-10-17 DIAGNOSIS — I77810 Thoracic aortic ectasia: Secondary | ICD-10-CM | POA: Diagnosis not present

## 2018-10-17 DIAGNOSIS — E1152 Type 2 diabetes mellitus with diabetic peripheral angiopathy with gangrene: Secondary | ICD-10-CM | POA: Diagnosis not present

## 2018-10-17 DIAGNOSIS — Z89511 Acquired absence of right leg below knee: Secondary | ICD-10-CM | POA: Diagnosis not present

## 2018-10-17 DIAGNOSIS — Z4781 Encounter for orthopedic aftercare following surgical amputation: Secondary | ICD-10-CM | POA: Diagnosis not present

## 2018-10-17 DIAGNOSIS — R911 Solitary pulmonary nodule: Secondary | ICD-10-CM | POA: Diagnosis not present

## 2018-10-17 NOTE — Telephone Encounter (Signed)
I spoke with Judson Roch from [facility] and inform that she can start the new orders as requested

## 2018-10-19 DIAGNOSIS — E785 Hyperlipidemia, unspecified: Secondary | ICD-10-CM | POA: Diagnosis not present

## 2018-10-19 DIAGNOSIS — Z4781 Encounter for orthopedic aftercare following surgical amputation: Secondary | ICD-10-CM | POA: Diagnosis not present

## 2018-10-19 DIAGNOSIS — I77810 Thoracic aortic ectasia: Secondary | ICD-10-CM | POA: Diagnosis not present

## 2018-10-19 DIAGNOSIS — I480 Paroxysmal atrial fibrillation: Secondary | ICD-10-CM | POA: Diagnosis not present

## 2018-10-19 DIAGNOSIS — R911 Solitary pulmonary nodule: Secondary | ICD-10-CM | POA: Diagnosis not present

## 2018-10-19 DIAGNOSIS — I10 Essential (primary) hypertension: Secondary | ICD-10-CM | POA: Diagnosis not present

## 2018-10-19 DIAGNOSIS — I69351 Hemiplegia and hemiparesis following cerebral infarction affecting right dominant side: Secondary | ICD-10-CM | POA: Diagnosis not present

## 2018-10-19 DIAGNOSIS — D649 Anemia, unspecified: Secondary | ICD-10-CM | POA: Diagnosis not present

## 2018-10-19 DIAGNOSIS — E1152 Type 2 diabetes mellitus with diabetic peripheral angiopathy with gangrene: Secondary | ICD-10-CM | POA: Diagnosis not present

## 2018-10-19 DIAGNOSIS — Z89511 Acquired absence of right leg below knee: Secondary | ICD-10-CM | POA: Diagnosis not present

## 2018-10-20 ENCOUNTER — Telehealth (INDEPENDENT_AMBULATORY_CARE_PROVIDER_SITE_OTHER): Payer: Self-pay

## 2018-10-20 NOTE — Telephone Encounter (Signed)
Amy([facility]) Speech Therapist stated that the patient was having more pain and was not feeling well so he had cancel his session.She is calling for verbal orders to make up therapy at the end of plan of care and I spoke with Fallon(N.P) and she said that is fine

## 2018-10-21 DIAGNOSIS — R911 Solitary pulmonary nodule: Secondary | ICD-10-CM | POA: Diagnosis not present

## 2018-10-21 DIAGNOSIS — Z89511 Acquired absence of right leg below knee: Secondary | ICD-10-CM | POA: Diagnosis not present

## 2018-10-21 DIAGNOSIS — Z4781 Encounter for orthopedic aftercare following surgical amputation: Secondary | ICD-10-CM | POA: Diagnosis not present

## 2018-10-21 DIAGNOSIS — I480 Paroxysmal atrial fibrillation: Secondary | ICD-10-CM | POA: Diagnosis not present

## 2018-10-21 DIAGNOSIS — I77810 Thoracic aortic ectasia: Secondary | ICD-10-CM | POA: Diagnosis not present

## 2018-10-21 DIAGNOSIS — I10 Essential (primary) hypertension: Secondary | ICD-10-CM | POA: Diagnosis not present

## 2018-10-21 DIAGNOSIS — D649 Anemia, unspecified: Secondary | ICD-10-CM | POA: Diagnosis not present

## 2018-10-21 DIAGNOSIS — E1152 Type 2 diabetes mellitus with diabetic peripheral angiopathy with gangrene: Secondary | ICD-10-CM | POA: Diagnosis not present

## 2018-10-21 DIAGNOSIS — I69351 Hemiplegia and hemiparesis following cerebral infarction affecting right dominant side: Secondary | ICD-10-CM | POA: Diagnosis not present

## 2018-10-21 DIAGNOSIS — E785 Hyperlipidemia, unspecified: Secondary | ICD-10-CM | POA: Diagnosis not present

## 2018-10-25 DIAGNOSIS — R911 Solitary pulmonary nodule: Secondary | ICD-10-CM | POA: Diagnosis not present

## 2018-10-25 DIAGNOSIS — E785 Hyperlipidemia, unspecified: Secondary | ICD-10-CM | POA: Diagnosis not present

## 2018-10-25 DIAGNOSIS — I69351 Hemiplegia and hemiparesis following cerebral infarction affecting right dominant side: Secondary | ICD-10-CM | POA: Diagnosis not present

## 2018-10-25 DIAGNOSIS — Z4781 Encounter for orthopedic aftercare following surgical amputation: Secondary | ICD-10-CM | POA: Diagnosis not present

## 2018-10-25 DIAGNOSIS — E1152 Type 2 diabetes mellitus with diabetic peripheral angiopathy with gangrene: Secondary | ICD-10-CM | POA: Diagnosis not present

## 2018-10-25 DIAGNOSIS — I10 Essential (primary) hypertension: Secondary | ICD-10-CM | POA: Diagnosis not present

## 2018-10-25 DIAGNOSIS — I480 Paroxysmal atrial fibrillation: Secondary | ICD-10-CM | POA: Diagnosis not present

## 2018-10-25 DIAGNOSIS — Z89511 Acquired absence of right leg below knee: Secondary | ICD-10-CM | POA: Diagnosis not present

## 2018-10-25 DIAGNOSIS — I77810 Thoracic aortic ectasia: Secondary | ICD-10-CM | POA: Diagnosis not present

## 2018-10-25 DIAGNOSIS — D649 Anemia, unspecified: Secondary | ICD-10-CM | POA: Diagnosis not present

## 2018-10-27 DIAGNOSIS — R69 Illness, unspecified: Secondary | ICD-10-CM | POA: Diagnosis not present

## 2018-10-28 DIAGNOSIS — I10 Essential (primary) hypertension: Secondary | ICD-10-CM | POA: Diagnosis not present

## 2018-10-28 DIAGNOSIS — Z89511 Acquired absence of right leg below knee: Secondary | ICD-10-CM | POA: Diagnosis not present

## 2018-10-28 DIAGNOSIS — I77810 Thoracic aortic ectasia: Secondary | ICD-10-CM | POA: Diagnosis not present

## 2018-10-28 DIAGNOSIS — I480 Paroxysmal atrial fibrillation: Secondary | ICD-10-CM | POA: Diagnosis not present

## 2018-10-28 DIAGNOSIS — I69351 Hemiplegia and hemiparesis following cerebral infarction affecting right dominant side: Secondary | ICD-10-CM | POA: Diagnosis not present

## 2018-10-28 DIAGNOSIS — R911 Solitary pulmonary nodule: Secondary | ICD-10-CM | POA: Diagnosis not present

## 2018-10-28 DIAGNOSIS — D649 Anemia, unspecified: Secondary | ICD-10-CM | POA: Diagnosis not present

## 2018-10-28 DIAGNOSIS — E785 Hyperlipidemia, unspecified: Secondary | ICD-10-CM | POA: Diagnosis not present

## 2018-10-28 DIAGNOSIS — E1152 Type 2 diabetes mellitus with diabetic peripheral angiopathy with gangrene: Secondary | ICD-10-CM | POA: Diagnosis not present

## 2018-10-28 DIAGNOSIS — Z4781 Encounter for orthopedic aftercare following surgical amputation: Secondary | ICD-10-CM | POA: Diagnosis not present

## 2018-11-01 DIAGNOSIS — Z4781 Encounter for orthopedic aftercare following surgical amputation: Secondary | ICD-10-CM | POA: Diagnosis not present

## 2018-11-01 DIAGNOSIS — E785 Hyperlipidemia, unspecified: Secondary | ICD-10-CM | POA: Diagnosis not present

## 2018-11-01 DIAGNOSIS — I480 Paroxysmal atrial fibrillation: Secondary | ICD-10-CM | POA: Diagnosis not present

## 2018-11-01 DIAGNOSIS — E1152 Type 2 diabetes mellitus with diabetic peripheral angiopathy with gangrene: Secondary | ICD-10-CM | POA: Diagnosis not present

## 2018-11-01 DIAGNOSIS — I77810 Thoracic aortic ectasia: Secondary | ICD-10-CM | POA: Diagnosis not present

## 2018-11-01 DIAGNOSIS — Z89511 Acquired absence of right leg below knee: Secondary | ICD-10-CM | POA: Diagnosis not present

## 2018-11-01 DIAGNOSIS — R911 Solitary pulmonary nodule: Secondary | ICD-10-CM | POA: Diagnosis not present

## 2018-11-01 DIAGNOSIS — D649 Anemia, unspecified: Secondary | ICD-10-CM | POA: Diagnosis not present

## 2018-11-01 DIAGNOSIS — I69351 Hemiplegia and hemiparesis following cerebral infarction affecting right dominant side: Secondary | ICD-10-CM | POA: Diagnosis not present

## 2018-11-01 DIAGNOSIS — I10 Essential (primary) hypertension: Secondary | ICD-10-CM | POA: Diagnosis not present

## 2018-11-03 DIAGNOSIS — I69351 Hemiplegia and hemiparesis following cerebral infarction affecting right dominant side: Secondary | ICD-10-CM | POA: Diagnosis not present

## 2018-11-03 DIAGNOSIS — I10 Essential (primary) hypertension: Secondary | ICD-10-CM | POA: Diagnosis not present

## 2018-11-03 DIAGNOSIS — D649 Anemia, unspecified: Secondary | ICD-10-CM | POA: Diagnosis not present

## 2018-11-03 DIAGNOSIS — E785 Hyperlipidemia, unspecified: Secondary | ICD-10-CM | POA: Diagnosis not present

## 2018-11-03 DIAGNOSIS — I480 Paroxysmal atrial fibrillation: Secondary | ICD-10-CM | POA: Diagnosis not present

## 2018-11-03 DIAGNOSIS — I77810 Thoracic aortic ectasia: Secondary | ICD-10-CM | POA: Diagnosis not present

## 2018-11-03 DIAGNOSIS — E1152 Type 2 diabetes mellitus with diabetic peripheral angiopathy with gangrene: Secondary | ICD-10-CM | POA: Diagnosis not present

## 2018-11-03 DIAGNOSIS — R911 Solitary pulmonary nodule: Secondary | ICD-10-CM | POA: Diagnosis not present

## 2018-11-03 DIAGNOSIS — Z89511 Acquired absence of right leg below knee: Secondary | ICD-10-CM | POA: Diagnosis not present

## 2018-11-03 DIAGNOSIS — Z4781 Encounter for orthopedic aftercare following surgical amputation: Secondary | ICD-10-CM | POA: Diagnosis not present

## 2018-11-04 ENCOUNTER — Telehealth (INDEPENDENT_AMBULATORY_CARE_PROVIDER_SITE_OTHER): Payer: Self-pay

## 2018-11-04 NOTE — Telephone Encounter (Signed)
Left a message on voicemail with medical advise

## 2018-11-04 NOTE — Telephone Encounter (Signed)
It can stay in place for up to a week, depending on drainage.  Generally, it is changed every 2-3 days to start but as the drainage decreases and the wound starts to heal, it can be changed less often.

## 2018-11-08 ENCOUNTER — Ambulatory Visit (INDEPENDENT_AMBULATORY_CARE_PROVIDER_SITE_OTHER): Payer: Medicare HMO | Admitting: Vascular Surgery

## 2018-11-08 DIAGNOSIS — I96 Gangrene, not elsewhere classified: Secondary | ICD-10-CM | POA: Diagnosis not present

## 2018-11-08 DIAGNOSIS — Z89511 Acquired absence of right leg below knee: Secondary | ICD-10-CM | POA: Diagnosis not present

## 2018-11-10 DIAGNOSIS — Z89511 Acquired absence of right leg below knee: Secondary | ICD-10-CM | POA: Diagnosis not present

## 2018-11-10 DIAGNOSIS — E1152 Type 2 diabetes mellitus with diabetic peripheral angiopathy with gangrene: Secondary | ICD-10-CM | POA: Diagnosis not present

## 2018-11-10 DIAGNOSIS — R911 Solitary pulmonary nodule: Secondary | ICD-10-CM | POA: Diagnosis not present

## 2018-11-10 DIAGNOSIS — I77810 Thoracic aortic ectasia: Secondary | ICD-10-CM | POA: Diagnosis not present

## 2018-11-10 DIAGNOSIS — Z4781 Encounter for orthopedic aftercare following surgical amputation: Secondary | ICD-10-CM | POA: Diagnosis not present

## 2018-11-10 DIAGNOSIS — E785 Hyperlipidemia, unspecified: Secondary | ICD-10-CM | POA: Diagnosis not present

## 2018-11-10 DIAGNOSIS — I69351 Hemiplegia and hemiparesis following cerebral infarction affecting right dominant side: Secondary | ICD-10-CM | POA: Diagnosis not present

## 2018-11-10 DIAGNOSIS — D649 Anemia, unspecified: Secondary | ICD-10-CM | POA: Diagnosis not present

## 2018-11-10 DIAGNOSIS — I480 Paroxysmal atrial fibrillation: Secondary | ICD-10-CM | POA: Diagnosis not present

## 2018-11-10 DIAGNOSIS — I10 Essential (primary) hypertension: Secondary | ICD-10-CM | POA: Diagnosis not present

## 2018-11-11 DIAGNOSIS — E785 Hyperlipidemia, unspecified: Secondary | ICD-10-CM | POA: Diagnosis not present

## 2018-11-11 DIAGNOSIS — I10 Essential (primary) hypertension: Secondary | ICD-10-CM | POA: Diagnosis not present

## 2018-11-11 DIAGNOSIS — E1152 Type 2 diabetes mellitus with diabetic peripheral angiopathy with gangrene: Secondary | ICD-10-CM | POA: Diagnosis not present

## 2018-11-11 DIAGNOSIS — R911 Solitary pulmonary nodule: Secondary | ICD-10-CM | POA: Diagnosis not present

## 2018-11-11 DIAGNOSIS — I69351 Hemiplegia and hemiparesis following cerebral infarction affecting right dominant side: Secondary | ICD-10-CM | POA: Diagnosis not present

## 2018-11-11 DIAGNOSIS — I480 Paroxysmal atrial fibrillation: Secondary | ICD-10-CM | POA: Diagnosis not present

## 2018-11-11 DIAGNOSIS — I77810 Thoracic aortic ectasia: Secondary | ICD-10-CM | POA: Diagnosis not present

## 2018-11-11 DIAGNOSIS — D649 Anemia, unspecified: Secondary | ICD-10-CM | POA: Diagnosis not present

## 2018-11-11 DIAGNOSIS — Z89511 Acquired absence of right leg below knee: Secondary | ICD-10-CM | POA: Diagnosis not present

## 2018-11-11 DIAGNOSIS — Z4781 Encounter for orthopedic aftercare following surgical amputation: Secondary | ICD-10-CM | POA: Diagnosis not present

## 2018-11-15 ENCOUNTER — Ambulatory Visit (INDEPENDENT_AMBULATORY_CARE_PROVIDER_SITE_OTHER): Payer: Medicare HMO | Admitting: Nurse Practitioner

## 2018-11-15 ENCOUNTER — Other Ambulatory Visit: Payer: Self-pay

## 2018-11-15 ENCOUNTER — Encounter (INDEPENDENT_AMBULATORY_CARE_PROVIDER_SITE_OTHER): Payer: Self-pay | Admitting: Nurse Practitioner

## 2018-11-15 VITALS — BP 110/71 | HR 99 | Resp 16

## 2018-11-15 DIAGNOSIS — I70209 Unspecified atherosclerosis of native arteries of extremities, unspecified extremity: Secondary | ICD-10-CM | POA: Diagnosis not present

## 2018-11-15 DIAGNOSIS — E1151 Type 2 diabetes mellitus with diabetic peripheral angiopathy without gangrene: Secondary | ICD-10-CM

## 2018-11-15 DIAGNOSIS — Z89511 Acquired absence of right leg below knee: Secondary | ICD-10-CM | POA: Diagnosis not present

## 2018-11-15 DIAGNOSIS — T8740 Infection of amputation stump, unspecified extremity: Secondary | ICD-10-CM

## 2018-11-15 DIAGNOSIS — Z7982 Long term (current) use of aspirin: Secondary | ICD-10-CM

## 2018-11-15 DIAGNOSIS — E782 Mixed hyperlipidemia: Secondary | ICD-10-CM | POA: Diagnosis not present

## 2018-11-15 NOTE — Progress Notes (Signed)
SUBJECTIVE:  Patient ID: [name]., male    DOB: 1955-11-30, 63 y.o.   MRN: [id number] Chief Complaint  Patient presents with  . Follow-up    wound check    HPI  [name]. is a 63 y.o. male that presents today for a check of his wound.  He and his wife are concerned due to the weeping of the wound.  They state that the wound is not bleeding is much but there is still significant amounts of drainage.  They state that the dressing needs to be changed anywhere between 24 to 36 hours.  This is also complicated by the fact that [name] is unable to tolerate any visible drainage on his dressings.  They currently still have home health however it is weekly visits.  Aquacell AG is being utilized on his wound changed as needed.  Otherwise he denies any fevers, chills, nausea, vomiting or diarrhea.  He denies any chest pain or shortness of breath.  Past Medical History:  Diagnosis Date  . Acute left PCA stroke ([place]) 02/17/2015  . Anemia   . Atherosclerosis of native arteries of the extremities with gangrene ([place]) 05/08/2018  . Atrial fibrillation ([place])   . Blind    right eye  . Diabetes mellitus with complication ([place])   . Dilated aortic root ([place])    a. 04/2018 Echo: 4.1cm. Asc Ao 3.5cm.  Marland Kitchen Dysrhythmia   . Gangrene of right foot ([place]) 06/02/2018  . GERD (gastroesophageal reflux disease)   . History of echocardiogram    a. 04/2018 Echo: Ef 60-65%, no rwma, midly to mod dil Ao root - 4.1cm. Asc Ao 3.5cm. Mild MR. Nl RV fxn. Nl PASP.  Marland Kitchen History of hernia repair   . History of stress test    a. 2016 MV ([name]): EF 58%, no ischemia.  . Hyperlipidemia   . Hypertension   . Leg pain   . Legally blind   . Necrotic toes ([place]) 02/21/2018  . PAD (peripheral artery disease) ([place])   . PAF (paroxysmal atrial fibrillation) ([name])    a. on Eliquis as of 2018; b. CHADS2VASc => 5 (HTN, DM, stroke x 2, vascular disease)  . Peripheral vascular disease ([place])    a. followed by Dr. Lucky Cowboy; b.  s/p kissing balloon stents and right external iliac stent in 10/2017; c. LE angiogrpahy 01/2018: No significant arterial occlusive disease in the lower extremities.  . Pulmonary nodules   . Stroke [facility])    a. 2016 & 2018  . Stroke [facility]) 07/11/2018    Past Surgical History:  Procedure Laterality Date  . AMPUTATION Right 06/02/2018   Procedure: AMPUTATION BELOW KNEE;  Surgeon: [name], MD;  Location: [facility] ORS;  Service: Vascular;  Laterality: Right;  . APPENDECTOMY    . COLONOSCOPY WITH PROPOFOL N/A 05/12/2018   Procedure: COLONOSCOPY WITH PROPOFOL;  Surgeon: [name], MD;  Location: [facility] ENDOSCOPY;  Service: Gastroenterology;  Laterality: N/A;  . ESOPHAGOGASTRODUODENOSCOPY (EGD) WITH PROPOFOL N/A 05/12/2018   Procedure: ESOPHAGOGASTRODUODENOSCOPY (EGD) WITH PROPOFOL;  Surgeon: [name], MD;  Location: [facility] ENDOSCOPY;  Service: Gastroenterology;  Laterality: N/A;  . GIVENS CAPSULE STUDY N/A 07/13/2018   Procedure: GIVENS CAPSULE STUDY;  Surgeon: [name], MD;  Location: [facility] ENDOSCOPY;  Service: Gastroenterology;  Laterality: N/A;  . HERNIA REPAIR     UMBILICAL  . LOWER EXTREMITY ANGIOGRAPHY Right 10/25/2017   Procedure: LOWER EXTREMITY ANGIOGRAPHY;  Surgeon: [name], MD;  Location: [place] INVASIVE CV  LAB;  Service: Cardiovascular;  Laterality: Right;  . LOWER EXTREMITY ANGIOGRAPHY Right 01/13/2018   Procedure: LOWER EXTREMITY ANGIOGRAPHY;  Surgeon: [name], MD;  Location: [place] CV LAB;  Service: Cardiovascular;  Laterality: Right;  . LOWER EXTREMITY INTERVENTION  10/25/2017   Procedure: LOWER EXTREMITY INTERVENTION;  Surgeon: [name], MD;  Location: [place] CV LAB;  Service: Cardiovascular;;  . TONSILLECTOMY    . WOUND DEBRIDEMENT Right 08/08/2018   Procedure: DEBRIDEMENT WOUND WITH WOUND VAC APPLICATION;  Surgeon: [name], MD;  Location: [facility] ORS;  Service: Vascular;  Laterality: Right;    Social History   Socioeconomic History  . Marital  status: Married    Spouse name: Not on file  . Number of children: 0  . Years of education: Not on file  . Highest education level: Associate degree: academic program  Occupational History  . Not on file  Social Needs  . Financial resource strain: Somewhat hard  . Food insecurity:    Worry: Never true    Inability: Never true  . Transportation needs:    Medical: No    Non-medical: No  Tobacco Use  . Smoking status: Former Smoker    Packs/day: 1.00    Years: 30.00    Pack years: 30.00    Types: Cigarettes    Last attempt to quit: 01/09/2018    Years since quitting: 0.8  . Smokeless tobacco: Never Used  Substance and Sexual Activity  . Alcohol use: Never    Alcohol/week: 0.0 standard drinks    Frequency: Never    Comment: occasionally beer   . Drug use: No  . Sexual activity: Yes  Lifestyle  . Physical activity:    Days per week: 0 days    Minutes per session: 0 min  . Stress: Not at all  Relationships  . Social connections:    Talks on phone: More than three times a week    Gets together: Never    Attends religious service: Never    Active member of club or organization: No    Attends meetings of clubs or organizations: Never    Relationship status: Married  . Intimate partner violence:    Fear of current or ex partner: No    Emotionally abused: No    Physically abused: No    Forced sexual activity: No  Other Topics Concern  . Not on file  Social History Narrative  . Not on file    Family History  Problem Relation Age of Onset  . Diabetes Father   . Hypertension Father   . Hyperlipidemia Father     Allergies  Allergen Reactions  . Sodium Pentobarbital [Pentobarbital] Shortness Of Breath  . Lipitor [Atorvastatin] Rash     Review of Systems   Review of Systems: Negative Unless Checked Constitutional: [] Weight loss  [] Fever  [] Chills Cardiac: [] Chest pain   [x]  Atrial Fibrillation  [] Palpitations   [] Shortness of breath when laying flat   [] Shortness  of breath with exertion. [] Shortness of breath at rest Vascular:  [] Pain in legs with walking   [] Pain in legs with standing [] Pain in legs when laying flat   [] Claudication    [] Pain in feet when laying flat    [] History of DVT   [] Phlebitis   [] Swelling in legs   [] Varicose veins   [] Non-healing ulcers Pulmonary:   [] Uses home oxygen   [] Productive cough   [] Hemoptysis   [] Wheeze  [] COPD   [] Asthma Neurologic:  [] Dizziness   []   Seizures  [] Blackouts [x] History of stroke   [] History of TIA  [] Aphasia   [] Temporary Blindness   [] Weakness or numbness in arm   [] Weakness or numbness in leg Musculoskeletal:   [] Joint swelling   [] Joint pain   [] Low back pain  []  History of Knee Replacement [] Arthritis [] back Surgeries  []  Spinal Stenosis    Hematologic:  [] Easy bruising  [] Easy bleeding   [] Hypercoagulable state   [] Anemic Gastrointestinal:  [] Diarrhea   [] Vomiting  [x] Gastroesophageal reflux/heartburn   [] Difficulty swallowing. [] Abdominal pain Genitourinary:  [] Chronic kidney disease   [] Difficult urination  [] Anuric   [] Blood in urine [] Frequent urination  [] Burning with urination   [] Hematuria Skin:  [] Rashes   [] Ulcers [x] Wounds Psychological:  [] History of anxiety   []  History of major depression  [x]  Memory Difficulties      OBJECTIVE:   Physical Exam  BP 110/71 (BP Location: Left Arm)   Pulse 99   Resp 16   Gen: WD/WN, NAD Head: [place]/AT, No temporalis wasting.  Ear/Nose/Throat: Hearing grossly intact, nares w/o erythema or drainage Eyes: PER, EOMI, sclera nonicteric.  Neck: Supple, no masses.  No JVD.  Pulmonary:  Good air movement, no use of accessory muscles.  Cardiac: RRR Vascular:  Red beefy surface of wound.  Slowly progressively healing Vessel Right Left  Radial Palpable Palpable  Dorsalis Pedis  Palpable  Posterior Tibial  Palpable   Gastrointestinal: soft, non-distended. No guarding/no peritoneal signs.  Musculoskeletal: Right above-knee amputation. No deformity or  atrophy.  Neurologic: Pain and light touch intact in extremities.  Symmetrical.  Speech is fluent. Motor exam as listed above. Psychiatric: Judgment intact, Mood & affect appropriate for pt's clinical situation. Dermatologic: No Venous rashes. No Ulcers Noted.  No changes consistent with cellulitis. Lymph : No Cervical lymphadenopathy, no lichenification or skin changes of chronic lymphedema.       ASSESSMENT AND PLAN:  1. Amputation stump infection ([place]) The wound is continuing to heal however slowly.  Assured the patient that with this type of wound drainage was expected.  Cautioned them that what was not normal was purulent drainage, foul-smelling odor, eschar formation, or erythema surrounding the wound.  They are instructed to contact us should any of these abnormal things happen.  However in the meantime if they are concerned about the wound they should express concerns to the wound care nurse and they should contact us in order for [place] to take a look at the wound.  Otherwise stable continue to do Aquacel Ag on the wound.  I advised that they should try to change every 48 hours if possible.  We will see them back in office in 2 months.  2. 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  Hgb A1C to be monitored as already arranged by primary service   3. Mixed hyperlipidemia Continue statin as ordered and reviewed, no changes at this time    Current Outpatient Medications on File Prior to Visit  Medication Sig Dispense Refill  . acetaminophen (TYLENOL) 500 MG tablet Take 1,000 mg by mouth every 6 (six) hours as needed (for pain.).    Marland Kitchen apixaban (ELIQUIS) 5 MG TABS tablet Take 1 tablet (5 mg total) by mouth 2 (two) times daily. 180 tablet 1  . aspirin 81 MG EC tablet Take 1 tablet (81 mg total) by mouth daily. 90 tablet 4  . Biotin  (  BIOTIN 5000) 5 MG CAPS Take 1 capsule by mouth daily.    . Calcium Carb-Cholecalciferol (CALCIUM 600-D PO) Take by mouth daily.    . cholecalciferol (VITAMIN D3) 25 MCG (1000 UT) tablet Take 5,000 Units by mouth daily.     Marland Kitchen donepezil (ARICEPT) 5 MG tablet Take 5 mg by mouth at bedtime.    . empagliflozin (JARDIANCE) 10 MG TABS tablet Take 5 mg by mouth daily. 90 tablet 1  . ezetimibe-simvastatin (VYTORIN) 10-40 MG tablet Take 1 tablet by mouth daily at 6 PM. 90 tablet 3  . ferrous sulfate (FERROUSUL) 325 (65 FE) MG tablet Take 1 tablet (325 mg total) by mouth daily with breakfast. 90 tablet 3  . gabapentin (NEURONTIN) 800 MG tablet Take 1 tablet (800 mg total) by mouth 3 (three) times daily. 270 tablet 1  . hydrochlorothiazide (MICROZIDE) 12.5 MG capsule Take 1 capsule (12.5 mg total) by mouth daily. (Patient taking differently: Take 12.5 mg by mouth daily. PRN) 90 capsule 1  . lansoprazole (PREVACID) 30 MG capsule Take 30 mg by mouth daily at 12 noon.    . metFORMIN (GLUCOPHAGE) 1000 MG tablet Take 1 tablet (1,000 mg total) by mouth 2 (two) times daily with a meal. 180 tablet 3  . metoprolol tartrate (LOPRESSOR) 25 MG tablet Take 1 tablet (25 mg total) by mouth 2 (two) times daily. 180 tablet 3  . Multiple Vitamin (MULTIVITAMIN WITH MINERALS) TABS tablet Take 1 tablet by mouth daily. 30 tablet 0  . nystatin (MYCOSTATIN/NYSTOP) powder Apply topically 4 (four) times daily. 15 g 0  . ONE TOUCH ULTRA TEST test strip USE TO TEST BLOOD SUGAR BID 100 each 12  . ONETOUCH DELICA LANCETS 40J MISC USE AS DIRECTED TO TEST BLOOD SUGAR BID 100 each 12  . traMADol (ULTRAM) 50 MG tablet Take 1 tablet ( 50 mg total) by mouth with dressing changes as needed for pain 30 tablet 0  . vitamin B-12 (CYANOCOBALAMIN) 1000 MCG tablet Take 1,000 mcg by mouth daily.    . vitamin C (ASCORBIC ACID) 500 MG tablet Take 500 mg by mouth daily.     No current facility-administered medications on file prior to visit.      There are no Patient Instructions on file for this visit. No follow-ups on file.   Kris Hartmann, NP  This note was completed with Sales executive.  Any errors are purely unintentional.

## 2018-11-16 DIAGNOSIS — Z89511 Acquired absence of right leg below knee: Secondary | ICD-10-CM | POA: Diagnosis not present

## 2018-11-16 DIAGNOSIS — I96 Gangrene, not elsewhere classified: Secondary | ICD-10-CM | POA: Diagnosis not present

## 2018-11-17 ENCOUNTER — Ambulatory Visit: Payer: Medicare HMO | Admitting: Gastroenterology

## 2018-11-18 DIAGNOSIS — R911 Solitary pulmonary nodule: Secondary | ICD-10-CM | POA: Diagnosis not present

## 2018-11-18 DIAGNOSIS — E785 Hyperlipidemia, unspecified: Secondary | ICD-10-CM | POA: Diagnosis not present

## 2018-11-18 DIAGNOSIS — D649 Anemia, unspecified: Secondary | ICD-10-CM | POA: Diagnosis not present

## 2018-11-18 DIAGNOSIS — I480 Paroxysmal atrial fibrillation: Secondary | ICD-10-CM | POA: Diagnosis not present

## 2018-11-18 DIAGNOSIS — E1152 Type 2 diabetes mellitus with diabetic peripheral angiopathy with gangrene: Secondary | ICD-10-CM | POA: Diagnosis not present

## 2018-11-18 DIAGNOSIS — I69351 Hemiplegia and hemiparesis following cerebral infarction affecting right dominant side: Secondary | ICD-10-CM | POA: Diagnosis not present

## 2018-11-18 DIAGNOSIS — I77810 Thoracic aortic ectasia: Secondary | ICD-10-CM | POA: Diagnosis not present

## 2018-11-18 DIAGNOSIS — Z4781 Encounter for orthopedic aftercare following surgical amputation: Secondary | ICD-10-CM | POA: Diagnosis not present

## 2018-11-18 DIAGNOSIS — I10 Essential (primary) hypertension: Secondary | ICD-10-CM | POA: Diagnosis not present

## 2018-11-18 DIAGNOSIS — Z89511 Acquired absence of right leg below knee: Secondary | ICD-10-CM | POA: Diagnosis not present

## 2018-11-24 ENCOUNTER — Encounter (INDEPENDENT_AMBULATORY_CARE_PROVIDER_SITE_OTHER): Payer: Self-pay

## 2018-11-24 ENCOUNTER — Other Ambulatory Visit (INDEPENDENT_AMBULATORY_CARE_PROVIDER_SITE_OTHER): Payer: Self-pay | Admitting: Nurse Practitioner

## 2018-11-24 DIAGNOSIS — T879 Unspecified complications of amputation stump: Secondary | ICD-10-CM

## 2018-11-24 MED ORDER — DOXYCYCLINE HYCLATE 100 MG PO CAPS: 100.0000 mg | ORAL_CAPSULE | Freq: Two times a day (BID) | ORAL | 0 refills | Status: DC

## 2018-11-24 NOTE — Telephone Encounter (Signed)
To be on the safe side, I've sent a RX for an antibiotic to the pharmacy.  I'd also like you to get some antibiotic ointment that you can place on the wound and cover with gauze, you can change this daily.  Be sure to let the wound care nurse have a look.

## 2018-11-25 DIAGNOSIS — Z4781 Encounter for orthopedic aftercare following surgical amputation: Secondary | ICD-10-CM | POA: Diagnosis not present

## 2018-11-25 DIAGNOSIS — Z89511 Acquired absence of right leg below knee: Secondary | ICD-10-CM | POA: Diagnosis not present

## 2018-11-25 DIAGNOSIS — I77810 Thoracic aortic ectasia: Secondary | ICD-10-CM | POA: Diagnosis not present

## 2018-11-25 DIAGNOSIS — I480 Paroxysmal atrial fibrillation: Secondary | ICD-10-CM | POA: Diagnosis not present

## 2018-11-25 DIAGNOSIS — I69351 Hemiplegia and hemiparesis following cerebral infarction affecting right dominant side: Secondary | ICD-10-CM | POA: Diagnosis not present

## 2018-11-25 DIAGNOSIS — E1152 Type 2 diabetes mellitus with diabetic peripheral angiopathy with gangrene: Secondary | ICD-10-CM | POA: Diagnosis not present

## 2018-11-25 DIAGNOSIS — D649 Anemia, unspecified: Secondary | ICD-10-CM | POA: Diagnosis not present

## 2018-11-25 DIAGNOSIS — E785 Hyperlipidemia, unspecified: Secondary | ICD-10-CM | POA: Diagnosis not present

## 2018-11-25 DIAGNOSIS — I10 Essential (primary) hypertension: Secondary | ICD-10-CM | POA: Diagnosis not present

## 2018-11-25 DIAGNOSIS — R911 Solitary pulmonary nodule: Secondary | ICD-10-CM | POA: Diagnosis not present

## 2018-11-28 DIAGNOSIS — Z89511 Acquired absence of right leg below knee: Secondary | ICD-10-CM | POA: Diagnosis not present

## 2018-11-30 DIAGNOSIS — Z4781 Encounter for orthopedic aftercare following surgical amputation: Secondary | ICD-10-CM | POA: Diagnosis not present

## 2018-11-30 DIAGNOSIS — E785 Hyperlipidemia, unspecified: Secondary | ICD-10-CM | POA: Diagnosis not present

## 2018-11-30 DIAGNOSIS — I77810 Thoracic aortic ectasia: Secondary | ICD-10-CM | POA: Diagnosis not present

## 2018-11-30 DIAGNOSIS — I480 Paroxysmal atrial fibrillation: Secondary | ICD-10-CM | POA: Diagnosis not present

## 2018-11-30 DIAGNOSIS — I69351 Hemiplegia and hemiparesis following cerebral infarction affecting right dominant side: Secondary | ICD-10-CM | POA: Diagnosis not present

## 2018-11-30 DIAGNOSIS — I10 Essential (primary) hypertension: Secondary | ICD-10-CM | POA: Diagnosis not present

## 2018-11-30 DIAGNOSIS — Z89511 Acquired absence of right leg below knee: Secondary | ICD-10-CM | POA: Diagnosis not present

## 2018-11-30 DIAGNOSIS — D649 Anemia, unspecified: Secondary | ICD-10-CM | POA: Diagnosis not present

## 2018-11-30 DIAGNOSIS — E1152 Type 2 diabetes mellitus with diabetic peripheral angiopathy with gangrene: Secondary | ICD-10-CM | POA: Diagnosis not present

## 2018-11-30 DIAGNOSIS — R911 Solitary pulmonary nodule: Secondary | ICD-10-CM | POA: Diagnosis not present

## 2018-12-08 DIAGNOSIS — Z89511 Acquired absence of right leg below knee: Secondary | ICD-10-CM | POA: Diagnosis not present

## 2018-12-08 DIAGNOSIS — I96 Gangrene, not elsewhere classified: Secondary | ICD-10-CM | POA: Diagnosis not present

## 2018-12-09 ENCOUNTER — Encounter (INDEPENDENT_AMBULATORY_CARE_PROVIDER_SITE_OTHER): Payer: Self-pay | Admitting: Vascular Surgery

## 2018-12-09 ENCOUNTER — Ambulatory Visit (INDEPENDENT_AMBULATORY_CARE_PROVIDER_SITE_OTHER): Payer: Medicare HMO | Admitting: Vascular Surgery

## 2018-12-09 ENCOUNTER — Other Ambulatory Visit: Payer: Self-pay

## 2018-12-09 VITALS — BP 114/74 | HR 91 | Resp 17

## 2018-12-09 DIAGNOSIS — I4891 Unspecified atrial fibrillation: Secondary | ICD-10-CM | POA: Diagnosis not present

## 2018-12-09 DIAGNOSIS — E1142 Type 2 diabetes mellitus with diabetic polyneuropathy: Secondary | ICD-10-CM

## 2018-12-09 DIAGNOSIS — Z79899 Other long term (current) drug therapy: Secondary | ICD-10-CM

## 2018-12-09 DIAGNOSIS — Z7901 Long term (current) use of anticoagulants: Secondary | ICD-10-CM

## 2018-12-09 DIAGNOSIS — Z89511 Acquired absence of right leg below knee: Secondary | ICD-10-CM

## 2018-12-09 DIAGNOSIS — Z89512 Acquired absence of left leg below knee: Secondary | ICD-10-CM | POA: Diagnosis not present

## 2018-12-09 DIAGNOSIS — E782 Mixed hyperlipidemia: Secondary | ICD-10-CM

## 2018-12-09 NOTE — Assessment & Plan Note (Signed)
blood glucose control important in reducing the progression of atherosclerotic disease. Also, involved in wound healing. On appropriate medications.  

## 2018-12-09 NOTE — Patient Instructions (Signed)
Skin Grafting, Adult Skin grafting is a surgical procedure to cover an area of damaged or missing skin with a piece of healthy skin. The healthy skin (skin graft) may be taken from:  Another part of your body (autograft).  Another person's body (allograft).  An animal's body (xenograft). You may need to have this procedure if you have lost a large area of skin from a burn, pressure sore, or surgery. Skin grafting can help your skin to heal. It can also help to prevent infection and large scars. There are several types of skin grafts. The main types include:  Split-thickness skin graft. This type uses the top skin layer (epidermis). It also uses a portion of the skin that contains blood vessels, nerves, hair follicles, and oil and sweat glands (partial-thickness dermis).  Full-thickness skin graft. This type uses all layers of the skin and some supporting tissues under the skin.  Composite skin graft. This type is used for grafts to parts of the body that need more reconstruction, such as the nose. The graft might include cartilage and fat in addition to skin.  Meshed skin graft. This type uses a small piece of skin from a donor. A health care provider makes multiple incisions throughout the graft, which allows the skin to stretch to a large area. The type of graft that you receive will depend on where your wound is and what skin is available to use for the graft. Tell a health care provider about:  Any allergies you have.  All medicines you are taking, including vitamins, herbs, eye drops, creams, and over-the-counter medicines.  Any problems you or family members have had with anesthetic medicines.  Any blood disorders you have.  Any surgeries you have had.  Any medical conditions you have.  Whether you are pregnant or may be pregnant.  Any family history of raised and bumpy scars (keloid scars). What are the risks? Generally, this is a safe procedure. However, problems may  occur, including:  Infection.  Loss of grafted skin.  Bleeding.  Blood (hematoma) or excess fluid (seroma)under the skin.  Scarring.  Allergic reactions to medicines.  Damage to other structures or organs. What happens before the procedure? Medicines  Ask your health care provider about: ? Changing or stopping your regular medicines. This is especially important if you are taking diabetes medicines or blood thinners. ? Taking over-the-counter medicines, vitamins, herbs, and supplements. ? Taking medicines such as aspirin and ibuprofen. These medicines can thin your blood. Do not take these medicines unless your health care provider tells you to take them.  You may be given antibiotic medicine to help prevent infection. General instructions  Follow instructions from your health care provider about eating or drinking restrictions.  Plan to have someone take you home from the hospital or clinic.  Plan to have a responsible adult care for you for at least 24 hours after you leave the hospital or clinic. This is important.  Take a shower on the morning of the procedure. You may have to use a certain cleanser if told by your health care provider.  Ask your health care provider how your surgical site will be marked or identified.  Keep all appointments with your health care provider. A small piece of your skin may need to be removed before the procedure and sent to a lab to grow. After it has grown, it will be used for the graft procedure. What happens during the procedure?   To reduce your risk of  infection: ? Your health care team will wash or sanitize their hands. ? Hair may be removed from the surgical area. ? Your skin will be washed with soap. ? Your wound will be cleaned.  An IV will be inserted into one of your veins to provide fluids and medicine.  You will be given one or more of the following: ? A medicine to help you relax (sedative). ? A medicine to make you  fall asleep (general anesthetic).  Blood flow to the wound will be stopped.  For a split-thickness graft: ? A piece of skin will be cut out with a surgical tool. It will be used to make the graft. ? The graft will be placed over the wound. ? Skin glue, stitches (sutures), or both will be used to hold the graft in place. A pressure wrap may also be used. ? The area will be covered with clean bandages (dressings).  For a full-thickness graft or a composite graft: ? A section of skin, muscle, fat, and blood supply will be cut out with a surgical tool. It will be used to make the graft. ? The graft will be placed over the wound. ? Skin glue, absorbable stitches (sutures), or both will be used to hold the graft in place. A pressure wrap may also be used. ? A split-thickness graft might be created to cover the area where the full-thickness or composite tissue was cut out (donor site). The procedure may vary among health care providers and hospitals. What happens after the procedure?  Your blood pressure, heart rate, breathing rate, and blood oxygen level will be monitored until the medicines you were given have worn off.  You may feel groggy from the medicines you were given during the procedure.  You may feel some tenderness around the wound (and the donor site, if this applies). You may be given medicine to help the pain.  A compression bandage or stocking may be placed around your wound and kept on for 5-10 days, or until the graft is stable and the wound has started to heal.  You may be given antibiotic medicines.  Do not drive for 24 hours if you were given a sedative.  If the graft was placed near a joint, you may need physical therapy to prevent stiffness from scarring. Summary  Skin grafting is a surgical procedure to cover an area of damaged or missing skin with a piece of healthy skin.  Before the procedure, follow instructions from your health care provider about eating or  drinking restrictions.  You may feel some tenderness around the wound (and the donor site, if this applies). You may be given medicine to help the pain. This information is not intended to replace advice given to you by your health care provider. Make sure you discuss any questions you have with your health care provider. Document Released: 03/19/2005 Document Revised: 10/05/2016 Document Reviewed: 10/05/2016 Elsevier Interactive Patient Education  2019 [employer].

## 2018-12-09 NOTE — Assessment & Plan Note (Signed)
At this point, his wound is clean and healthy but remains quite large.  This will take many months to heal at the current rate.  I think a synthetic skin graft would speed up the healing process and help bring epithelialization to the tissue.  I discussed this with the patient today and he is agreeable to proceed.  This will be scheduled for the near future.

## 2018-12-09 NOTE — Progress Notes (Signed)
MRN : [id number]  [name]. is a 63 y.o. (September 24, 1955) male who presents with chief complaint of  Chief Complaint  Patient presents with  . Follow-up  .  History of Present Illness: Patient returns today in follow up of his right BKA wound.  His wound is clean with excellent granulation tissue and no signs of infection but remains quite large.  It involves the bottom of the stump as well as a little bit posteriorly and goes up to the original incision line anteriorly.  His pain is much better.  He can straighten his knee.  He is still doing appropriate wound care on his wound.  Current Outpatient Medications  Medication Sig Dispense Refill  . acetaminophen (TYLENOL) 500 MG tablet Take 1,000 mg by mouth every 6 (six) hours as needed (for pain.).    Marland Kitchen apixaban (ELIQUIS) 5 MG TABS tablet Take 1 tablet (5 mg total) by mouth 2 (two) times daily. 180 tablet 1  . aspirin 81 MG EC tablet Take 1 tablet (81 mg total) by mouth daily. 90 tablet 4  . Biotin (BIOTIN 5000) 5 MG CAPS Take 1 capsule by mouth daily.    . Calcium Carb-Cholecalciferol (CALCIUM 600-D PO) Take by mouth daily.    . cholecalciferol (VITAMIN D3) 25 MCG (1000 UT) tablet Take 5,000 Units by mouth daily.     Marland Kitchen donepezil (ARICEPT) 5 MG tablet Take 5 mg by mouth at bedtime.    Marland Kitchen doxycycline (VIBRAMYCIN) 100 MG capsule Take 1 capsule (100 mg total) by mouth 2 (two) times daily. 20 capsule 0  . empagliflozin (JARDIANCE) 10 MG TABS tablet Take 5 mg by mouth daily. 90 tablet 1  . ezetimibe-simvastatin (VYTORIN) 10-40 MG tablet Take 1 tablet by mouth daily at 6 PM. 90 tablet 3  . ferrous sulfate (FERROUSUL) 325 (65 FE) MG tablet Take 1 tablet (325 mg total) by mouth daily with breakfast. 90 tablet 3  . gabapentin (NEURONTIN) 800 MG tablet Take 1 tablet (800 mg total) by mouth 3 (three) times daily. 270 tablet 1  . hydrochlorothiazide (MICROZIDE) 12.5 MG capsule Take 1 capsule (12.5 mg total) by mouth daily. (Patient taking  differently: Take 12.5 mg by mouth daily. PRN) 90 capsule 1  . lansoprazole (PREVACID) 30 MG capsule Take 30 mg by mouth daily at 12 noon.    . metFORMIN (GLUCOPHAGE) 1000 MG tablet Take 1 tablet (1,000 mg total) by mouth 2 (two) times daily with a meal. 180 tablet 3  . metoprolol tartrate (LOPRESSOR) 25 MG tablet Take 1 tablet (25 mg total) by mouth 2 (two) times daily. 180 tablet 3  . Multiple Vitamin (MULTIVITAMIN WITH MINERALS) TABS tablet Take 1 tablet by mouth daily. 30 tablet 0  . nystatin (MYCOSTATIN/NYSTOP) powder Apply topically 4 (four) times daily. 15 g 0  . ONE TOUCH ULTRA TEST test strip USE TO TEST BLOOD SUGAR BID 100 each 12  . ONETOUCH DELICA LANCETS 94T MISC USE AS DIRECTED TO TEST BLOOD SUGAR BID 100 each 12  . traMADol (ULTRAM) 50 MG tablet Take 1 tablet ( 50 mg total) by mouth with dressing changes as needed for pain 30 tablet 0  . vitamin B-12 (CYANOCOBALAMIN) 1000 MCG tablet Take 1,000 mcg by mouth daily.    . vitamin C (ASCORBIC ACID) 500 MG tablet Take 500 mg by mouth daily.     No current facility-administered medications for this visit.     Past Medical History:  Diagnosis Date  . Acute  left PCA stroke ([place]) 02/17/2015  . Anemia   . Atherosclerosis of native arteries of the extremities with gangrene ([place]) 05/08/2018  . Atrial fibrillation ([place])   . Blind    right eye  . Diabetes mellitus with complication ([place])   . Dilated aortic root ([place])    a. 04/2018 Echo: 4.1cm. Asc Ao 3.5cm.  Marland Kitchen Dysrhythmia   . Gangrene of right foot ([place]) 06/02/2018  . GERD (gastroesophageal reflux disease)   . History of echocardiogram    a. 04/2018 Echo: Ef 60-65%, no rwma, midly to mod dil Ao root - 4.1cm. Asc Ao 3.5cm. Mild MR. Nl RV fxn. Nl PASP.  Marland Kitchen History of hernia repair   . History of stress test    a. 2016 MV ([name]): EF 58%, no ischemia.  . Hyperlipidemia   . Hypertension   . Leg pain   . Legally blind   . Necrotic toes ([place]) 02/21/2018  . PAD (peripheral artery disease)  ([place])   . PAF (paroxysmal atrial fibrillation) ([name])    a. on Eliquis as of 2018; b. CHADS2VASc => 5 (HTN, DM, stroke x 2, vascular disease)  . Peripheral vascular disease ([place])    a. followed by Dr. Lucky Cowboy; b. s/p kissing balloon stents and right external iliac stent in 10/2017; c. LE angiogrpahy 01/2018: No significant arterial occlusive disease in the lower extremities.  . Pulmonary nodules   . Stroke [facility])    a. 2016 & 2018  . Stroke [facility]) 07/11/2018    Past Surgical History:  Procedure Laterality Date  . AMPUTATION Right 06/02/2018   Procedure: AMPUTATION BELOW KNEE;  Surgeon: [name], MD;  Location: [facility] ORS;  Service: Vascular;  Laterality: Right;  . APPENDECTOMY    . COLONOSCOPY WITH PROPOFOL N/A 05/12/2018   Procedure: COLONOSCOPY WITH PROPOFOL;  Surgeon: [name], MD;  Location[facility]COPY;  Service: Gastroenterology;  Laterality: N/A;  . ESOPHAGOGASTRODUODENOSCOPY (EGD) WITH PROPOFOL N/A 05/12/2018   Procedure: ESOPHAGOGASTRODUODENOSCOPY (EGD) WITH PROPOFOL;  Surgeon: [name], MD;  Location[facility] ENDOSCOPY;  Service: Gastroenterology;  Laterality: N/A;  . GIVENS CAPSULE STUDY N/A 07/13/2018   Procedure: GIVENS CAPSULE STUDY;  Surgeon: [name], MD;  Location: [facility] ENDOSCOPY;  Service: Gastroenterology;  Laterality: N/A;  . HERNIA REPAIR     UMBILICAL  . LOWER EXTREMITY ANGIOGRAPHY Right 10/25/2017   Procedure: LOWER EXTREMITY ANGIOGRAPHY;  Surgeon: [name], MD;  Location: [place] CV LAB;  Service: Cardiovascular;  Laterality: Right;  . LOWER EXTREMITY ANGIOGRAPHY Right 01/13/2018   Procedure: LOWER EXTREMITY ANGIOGRAPHY;  Surgeon: [name], MD;  Location: [place] CV LAB;  Service: Cardiovascular;  Laterality: Right;  . LOWER EXTREMITY INTERVENTION  10/25/2017   Procedure: LOWER EXTREMITY INTERVENTION;  Surgeon: [name], MD;  Location: [place] CV LAB;  Service: Cardiovascular;;  . TONSILLECTOMY    . WOUND DEBRIDEMENT Right 08/08/2018    Procedure: DEBRIDEMENT WOUND WITH WOUND VAC APPLICATION;  Surgeon: [name], MD;  Location: [facility] ORS;  Service: Vascular;  Laterality: Right;    Social History Social History   Tobacco Use  . Smoking status: Former Smoker    Packs/day: 1.00    Years: 30.00    Pack years: 30.00    Types: Cigarettes    Last attempt to quit: 01/09/2018    Years since quitting: 0.9  . Smokeless tobacco: Never Used  Substance Use Topics  . Alcohol use: Never    Alcohol/week: 0.0 standard drinks    Frequency: Never  Comment: occasionally beer   . Drug use: No    Family History Family History  Problem Relation Age of Onset  . Diabetes Father   . Hypertension Father   . Hyperlipidemia Father      Allergies  Allergen Reactions  . Sodium Pentobarbital [Pentobarbital] Shortness Of Breath  . Lipitor [Atorvastatin] Rash     REVIEW OF SYSTEMS (Negative unless checked)  Constitutional: [] Weight loss  [] Fever  [] Chills Cardiac: [] Chest pain   [] Chest pressure   [] Palpitations   [] Shortness of breath when laying flat   [] Shortness of breath at rest   [] Shortness of breath with exertion. Vascular:  [] Pain in legs with walking   [] Pain in legs at rest   [] Pain in legs when laying flat   [] Claudication   [] Pain in feet when walking  [] Pain in feet at rest  [] Pain in feet when laying flat   [] History of DVT   [] Phlebitis   [] Swelling in legs   [] Varicose veins   [x] Non-healing ulcers Pulmonary:   [] Uses home oxygen   [] Productive cough   [] Hemoptysis   [] Wheeze  [] COPD   [] Asthma Neurologic:  [] Dizziness  [] Blackouts   [] Seizures   [x] History of stroke   [] History of TIA  [] Aphasia   [] Temporary blindness   [] Dysphagia   [] Weakness or numbness in arms   [] Weakness or numbness in legs Musculoskeletal:  [x] Arthritis   [] Joint swelling   [] Joint pain   [] Low back pain Hematologic:  [] Easy bruising  [] Easy bleeding   [] Hypercoagulable state   [] Anemic   Gastrointestinal:  [] Blood in stool   [] Vomiting  blood  [] Gastroesophageal reflux/heartburn   [] Abdominal pain Genitourinary:  [] Chronic kidney disease   [] Difficult urination  [] Frequent urination  [] Burning with urination   [] Hematuria Skin:  [] Rashes   [x] Ulcers   [x] Wounds Psychological:  [] History of anxiety   []  History of major depression.  Physical Examination  BP 114/74 (BP Location: Left Arm)   Pulse 91   Resp 17  Gen:  WD/WN, NAD Head: [place]/AT, No temporalis wasting. Ear/Nose/Throat: Hearing grossly intact, nares w/o erythema or drainage Eyes: Conjunctiva clear. Sclera non-icteric Neck: Supple.  Trachea midline Pulmonary:  Good air movement, no use of accessory muscles.  Cardiac: RRR, no JVD Vascular:  Vessel Right Left  Radial Palpable Palpable                                   Gastrointestinal: soft, non-tender/non-distended. No guarding/reflex.  Musculoskeletal: M/S 5/5 throughout.  No deformity or atrophy.  Right BKA wound is fairly large at greater than 10 cm in width and length but remains very clean with excellent granulation tissue.  No edema. Neurologic: Sensation grossly intact in extremities.  Symmetrical.  Speech is fluent.  Psychiatric: Judgment intact, Mood & affect appropriate for pt's clinical situation. Dermatologic: BKA wound as above       Labs Recent Results (from the past 2160 hour(s))  Ferritin     Status: None   Collection Time: 09/13/18  3:23 PM  Result Value Ref Range   Ferritin 28 24 - 336 ng/mL    Comment: Performed at [facility], [address] [place], [place] [address]  Iron and TIBC     Status: Abnormal   Collection Time: 09/13/18  3:23 PM  Result Value Ref Range   Iron 39 (L) 45 - 182 ug/dL   TIBC 450 250 - 450 ug/dL  Saturation Ratios 9 (L) 17.9 - 39.5 %   UIBC 411 ug/dL    Comment: Performed at [facility], [address] [place], [place] [address]  CBC with Differential/Platelet     Status: Abnormal   Collection Time: 09/13/18  3:23 PM   Result Value Ref Range   WBC 5.7 4.0 - 10.5 K/uL   RBC 5.34 4.22 - 5.81 MIL/uL   Hemoglobin 13.2 13.0 - 17.0 g/dL   HCT 43.0 39.0 - 52.0 %   MCV 80.5 80.0 - 100.0 fL   MCH 24.7 (L) 26.0 - 34.0 pg   MCHC 30.7 30.0 - 36.0 g/dL   RDW 15.6 (H) 11.5 - 15.5 %   Platelets 186 150 - 400 K/uL   nRBC 0.0 0.0 - 0.2 %   Neutrophils Relative % 61 %   Neutro Abs 3.4 1.7 - 7.7 K/uL   Lymphocytes Relative 25 %   Lymphs Abs 1.4 0.7 - 4.0 K/uL   Monocytes Relative 9 %   Monocytes Absolute 0.5 0.1 - 1.0 K/uL   Eosinophils Relative 4 %   Eosinophils Absolute 0.2 0.0 - 0.5 K/uL   Basophils Relative 1 %   Basophils Absolute 0.0 0.0 - 0.1 K/uL   Immature Granulocytes 0 %   Abs Immature Granulocytes 0.01 0.00 - 0.07 K/uL    Comment: Performed at [facility], [address] [place], [place] [address]  Bayer DCA Hb A1c Waived     Status: None   Collection Time: 09/26/18  1:50 PM  Result Value Ref Range   HB A1C (BAYER DCA - WAIVED) 6.9 <7.0 %    Comment:                                       Diabetic Adult            <7.0                                       Healthy Adult        4.3 - 5.7                                                           (DCCT/NGSP) American Diabetes Association's Summary of Glycemic Recommendations for Adults with Diabetes: Hemoglobin A1c <7.0%. More stringent glycemic goals (A1c <6.0%) may further reduce complications at the cost of increased risk of hypoglycemia.   Glucose Hemocue Waived     Status: Abnormal   Collection Time: 09/26/18  1:50 PM  Result Value Ref Range   Glu Hemocue Waived 123 (H) 65 - 99 mg/dL  Microalbumin, Urine Waived     Status: None   Collection Time: 09/26/18  1:50 PM  Result Value Ref Range   Microalb, Ur Waived 10 0 - 19 mg/L   Creatinine, Urine Waived 50 10 - 300 mg/dL   Microalb/Creat Ratio <30 <30 mg/g    Comment:                              Abnormal:  30 - 300                         High Abnormal:           >300   CBC  with Differential/Platelet     Status: Abnormal   Collection Time: 09/26/18  4:07 PM  Result Value Ref Range   WBC 7.3 3.4 - 10.8 x10E3/uL   RBC 5.50 4.14 - 5.80 x10E6/uL   Hemoglobin 14.2 13.0 - 17.7 g/dL   Hematocrit 44.6 37.5 - 51.0 %   MCV 81 79 - 97 fL   MCH 25.8 (L) 26.6 - 33.0 pg   MCHC 31.8 31.5 - 35.7 g/dL   RDW 15.9 (H) 11.6 - 15.4 %   Platelets 288 150 - 450 x10E3/uL   Neutrophils 68 Not Estab. %   Lymphs 20 Not Estab. %   Monocytes 7 Not Estab. %   Eos 4 Not Estab. %   Basos 1 Not Estab. %   Neutrophils Absolute 4.9 1.4 - 7.0 x10E3/uL   Lymphocytes Absolute 1.5 0.7 - 3.1 x10E3/uL   Monocytes Absolute 0.5 0.1 - 0.9 x10E3/uL   EOS (ABSOLUTE) 0.3 0.0 - 0.4 x10E3/uL   Basophils Absolute 0.1 0.0 - 0.2 x10E3/uL   Immature Granulocytes 0 Not Estab. %   Immature Grans (Abs) 0.0 0.0 - 0.1 x10E3/uL  Comprehensive metabolic panel     Status: Abnormal   Collection Time: 09/26/18  4:07 PM  Result Value Ref Range   Glucose 126 (H) 65 - 99 mg/dL   BUN 16 8 - 27 mg/dL   Creatinine, Ser 0.77 0.76 - 1.27 mg/dL   GFR calc non Af Amer 97 >59 mL/min/1.73   GFR calc Af Amer 112 >59 mL/min/1.73   BUN/Creatinine Ratio 21 10 - 24   Sodium 144 134 - 144 mmol/L   Potassium 4.5 3.5 - 5.2 mmol/L   Chloride 99 96 - 106 mmol/L   CO2 26 20 - 29 mmol/L   Calcium 10.8 (H) 8.6 - 10.2 mg/dL   Total Protein 7.2 6.0 - 8.5 g/dL   Albumin 4.8 3.8 - 4.8 g/dL    Comment:               **Please note reference interval change**   Globulin, Total 2.4 1.5 - 4.5 g/dL   Albumin/Globulin Ratio 2.0 1.2 - 2.2   Bilirubin Total 0.7 0.0 - 1.2 mg/dL   Alkaline Phosphatase 88 39 - 117 IU/L   AST 26 0 - 40 IU/L   ALT 31 0 - 44 IU/L  Lipid Panel w/o Chol/HDL Ratio     Status: None   Collection Time: 09/26/18  4:07 PM  Result Value Ref Range   Cholesterol, Total 116 100 - 199 mg/dL   Triglycerides 119 0 - 149 mg/dL   HDL 51 >39 mg/dL   VLDL Cholesterol Cal 24 5 - 40 mg/dL   LDL Calculated 41 0 - 99 mg/dL   VITAMIN D 25 Hydroxy (Vit-D Deficiency, Fractures)     Status: None   Collection Time: 09/26/18  4:07 PM  Result Value Ref Range   Vit D, 25-Hydroxy 41.7 30.0 - 100.0 ng/mL    Comment: Vitamin D deficiency has been defined by the Institute of Medicine and an Endocrine Society practice guideline as a level of serum 25-OH vitamin D less than 20 ng/mL (1,2). The Endocrine Society went on to further define vitamin D insufficiency as a level between 21  and 29 ng/mL (2). 1. IOM (Institute of Medicine). 2010. Dietary reference    intakes for calcium and D. [place]: The    [employer]. 2. Holick MF, Binkley [place], Bischoff-Ferrari HA, et al.    Evaluation, treatment, and prevention of vitamin D    deficiency: an Endocrine Society clinical practice    guideline. JCEM. 2011 Jul; 96(7):1911-30.     Radiology No results found.  Assessment/Plan PVD (peripheral vascular disease) ([place]) Perfusion checked and is adequate to the stump with normal triphasic waveforms to the right popliteal artery.  A-fib ([name]) On anticoagulation.   Hyperlipidemia lipid control important in reducing the progression of atherosclerotic disease. Continue statin therapy   Diabetic peripheral neuropathy ([name]) blood glucose control important in reducing the progression of atherosclerotic disease. Also, involved in wound healing. On appropriate medications.   S/P bilateral BKA (below knee amputation) ([place]) At this point, his wound is clean and healthy but remains quite large.  This will take many months to heal at the current rate.  I think a synthetic skin graft would speed up the healing process and help bring epithelialization to the tissue.  I discussed this with the patient today and he is agreeable to proceed.  This will be scheduled for the near future.    Leotis Pain, MD  12/09/2018 12:32 PM    This note was created with Dragon medical transcription system.  Any errors from dictation are  purely unintentional

## 2018-12-09 NOTE — Assessment & Plan Note (Signed)
lipid control important in reducing the progression of atherosclerotic disease. Continue statin therapy  

## 2018-12-12 ENCOUNTER — Other Ambulatory Visit: Payer: Self-pay

## 2018-12-12 ENCOUNTER — Inpatient Hospital Stay: Payer: Medicare HMO | Attending: Oncology

## 2018-12-12 DIAGNOSIS — D509 Iron deficiency anemia, unspecified: Secondary | ICD-10-CM | POA: Diagnosis not present

## 2018-12-12 DIAGNOSIS — D500 Iron deficiency anemia secondary to blood loss (chronic): Secondary | ICD-10-CM

## 2018-12-12 LAB — CBC WITH DIFFERENTIAL/PLATELET
Abs Immature Granulocytes: 0.03 10*3/uL (ref 0.00–0.07)
Basophils Absolute: 0 10*3/uL (ref 0.0–0.1)
Basophils Relative: 0 %
Eosinophils Absolute: 0.3 10*3/uL (ref 0.0–0.5)
Eosinophils Relative: 4 %
HCT: 40.5 % (ref 39.0–52.0)
Hemoglobin: 13.1 g/dL (ref 13.0–17.0)
Immature Granulocytes: 0 %
Lymphocytes Relative: 19 %
Lymphs Abs: 1.5 10*3/uL (ref 0.7–4.0)
MCH: 28 pg (ref 26.0–34.0)
MCHC: 32.3 g/dL (ref 30.0–36.0)
MCV: 86.5 fL (ref 80.0–100.0)
Monocytes Absolute: 0.6 10*3/uL (ref 0.1–1.0)
Monocytes Relative: 8 %
Neutro Abs: 5.2 10*3/uL (ref 1.7–7.7)
Neutrophils Relative %: 69 %
Platelets: 210 10*3/uL (ref 150–400)
RBC: 4.68 MIL/uL (ref 4.22–5.81)
RDW: 15.2 % (ref 11.5–15.5)
WBC: 7.6 10*3/uL (ref 4.0–10.5)
nRBC: 0 % (ref 0.0–0.2)

## 2018-12-12 LAB — FERRITIN: Ferritin: 22 ng/mL — ABNORMAL LOW (ref 24–336)

## 2018-12-12 LAB — IRON AND TIBC
Iron: 49 ug/dL (ref 45–182)
Saturation Ratios: 12 % — ABNORMAL LOW (ref 17.9–39.5)
TIBC: 402 ug/dL (ref 250–450)
UIBC: 353 ug/dL

## 2018-12-13 ENCOUNTER — Other Ambulatory Visit: Payer: Self-pay

## 2018-12-13 ENCOUNTER — Inpatient Hospital Stay (HOSPITAL_BASED_OUTPATIENT_CLINIC_OR_DEPARTMENT_OTHER): Payer: Medicare HMO | Admitting: Oncology

## 2018-12-13 ENCOUNTER — Inpatient Hospital Stay: Payer: Medicare HMO

## 2018-12-13 ENCOUNTER — Encounter: Payer: Self-pay | Admitting: Oncology

## 2018-12-13 ENCOUNTER — Telehealth (INDEPENDENT_AMBULATORY_CARE_PROVIDER_SITE_OTHER): Payer: Self-pay

## 2018-12-13 DIAGNOSIS — D500 Iron deficiency anemia secondary to blood loss (chronic): Secondary | ICD-10-CM

## 2018-12-13 DIAGNOSIS — Z7901 Long term (current) use of anticoagulants: Secondary | ICD-10-CM | POA: Diagnosis not present

## 2018-12-13 DIAGNOSIS — K5903 Drug induced constipation: Secondary | ICD-10-CM | POA: Diagnosis not present

## 2018-12-13 DIAGNOSIS — K5520 Angiodysplasia of colon without hemorrhage: Secondary | ICD-10-CM

## 2018-12-13 MED ORDER — FERROUS SULFATE 325 (65 FE) MG PO TABS: 325.0000 mg | ORAL_TABLET | Freq: Two times a day (BID) | ORAL | 3 refills | Status: DC

## 2018-12-13 MED ORDER — LUBIPROSTONE 24 MCG PO CAPS: 24.0000 ug | ORAL_CAPSULE | Freq: Two times a day (BID) | ORAL | 1 refills | Status: DC

## 2018-12-13 MED ORDER — DOCUSATE SODIUM 100 MG PO CAPS: 100.0000 mg | ORAL_CAPSULE | Freq: Every day | ORAL | 2 refills | Status: DC

## 2018-12-13 NOTE — Progress Notes (Signed)
Called patient for Telehealth visit via [employer].  Patient states no new concerns today.  

## 2018-12-13 NOTE — Progress Notes (Signed)
HEMATOLOGY-ONCOLOGY TeleHEALTH VISIT PROGRESS NOTE  I connected with [name]. on 12/13/18 at  8:30 AM EDT by video enabled telemedicine visit and verified that I am speaking with the correct person using two identifiers. I discussed the limitations, risks, security and privacy concerns of performing an evaluation and management service by telemedicine and the availability of in-person appointments. I also discussed with the patient that there may be a patient responsible charge related to this service. The patient expressed understanding and agreed to proceed.   Other persons participating in the visit and their role in the encounter:  Geraldine Solar, CMA, check in patient      Patient's location: Home  Provider's location: Home office Chief Complaint: Follow-up for management of iron deficiency anemia.   INTERVAL HISTORY [name]. is a 63 y.o. male who has above history reviewed by me today presents for follow up visit for management of iron deficiency anemia Problems and complaints are listed below:  Patient was last seen on September 14, 2018, patient received IV Feraheme after that visit. He reports feeling well.  No new complaints today.  Fatigue level is at baseline. Denies hematochezia, hematuria, hematemesis, epistaxis, black tarry stool or easy bruising.  Patient was recently seen by vascular surgery Dr. Lucky Cowboy Corene Cornea for history of right BKA wound.  His wound is clean with excellent granulation tissue and no signs of infection.  Patient also reports pain is much better.  Review of Systems  Constitutional: Negative for appetite change, chills, fever and unexpected weight change.  HENT:   Negative for hearing loss and voice change.   Eyes: Negative for eye problems and icterus.  Respiratory: Negative for chest tightness, cough and shortness of breath.   Cardiovascular: Negative for chest pain and leg swelling.  Gastrointestinal: Negative for abdominal distention and  abdominal pain.  Endocrine: Negative for hot flashes.  Genitourinary: Negative for difficulty urinating, dysuria and frequency.   Musculoskeletal: Negative for arthralgias.  Skin: Negative for itching and rash.  Neurological: Negative for light-headedness and numbness.  Hematological: Negative for adenopathy. Does not bruise/bleed easily.  Psychiatric/Behavioral: Negative for confusion.    Past Medical History:  Diagnosis Date  . Acute left PCA stroke ([place]) 02/17/2015  . Anemia   . Atherosclerosis of native arteries of the extremities with gangrene ([place]) 05/08/2018  . Atrial fibrillation ([place])   . Blind    right eye  . Diabetes mellitus with complication ([place])   . Dilated aortic root ([place])    a. 04/2018 Echo: 4.1cm. Asc Ao 3.5cm.  Marland Kitchen Dysrhythmia   . Gangrene of right foot ([place]) 06/02/2018  . GERD (gastroesophageal reflux disease)   . History of echocardiogram    a. 04/2018 Echo: Ef 60-65%, no rwma, midly to mod dil Ao root - 4.1cm. Asc Ao 3.5cm. Mild MR. Nl RV fxn. Nl PASP.  Marland Kitchen History of hernia repair   . History of stress test    a. 2016 MV ([name]): EF 58%, no ischemia.  . Hyperlipidemia   . Hypertension   . Leg pain   . Legally blind   . Necrotic toes ([place]) 02/21/2018  . PAD (peripheral artery disease) ([place])   . PAF (paroxysmal atrial fibrillation) ([name])    a. on Eliquis as of 2018; b. CHADS2VASc => 5 (HTN, DM, stroke x 2, vascular disease)  . Peripheral vascular disease ([place])    a. followed by Dr. Lucky Cowboy; b. s/p kissing balloon stents and right external iliac stent in 10/2017; c.  LE angiogrpahy 01/2018: No significant arterial occlusive disease in the lower extremities.  . Pulmonary nodules   . Stroke [facility])    a. 2016 & 2018  . Stroke [facility]) 07/11/2018   Past Surgical History:  Procedure Laterality Date  . AMPUTATION Right 06/02/2018   Procedure: AMPUTATION BELOW KNEE;  Surgeon: [name], MD;  Location: [facility] ORS;  Service: Vascular;  Laterality: Right;  . APPENDECTOMY    .  COLONOSCOPY WITH PROPOFOL N/A 05/12/2018   Procedure: COLONOSCOPY WITH PROPOFOL;  Surgeon: [name], MD;  Location[facility] ENDOSCOPY;  Service: Gastroenterology;  Laterality: N/A;  . ESOPHAGOGASTRODUODENOSCOPY (EGD) WITH PROPOFOL N/A 05/12/2018   Procedure: ESOPHAGOGASTRODUODENOSCOPY (EGD) WITH PROPOFOL;  Surgeon: [name], MD;  Location: [facility] ENDOSCOPY;  Service: Gastroenterology;  Laterality: N/A;  . GIVENS CAPSULE STUDY N/A 07/13/2018   Procedure: GIVENS CAPSULE STUDY;  Surgeon: [name], MD;  Location[facility] ENDOSCOPY;  Service: Gastroenterology;  Laterality: N/A;  . HERNIA REPAIR     UMBILICAL  . LOWER EXTREMITY ANGIOGRAPHY Right 10/25/2017   Procedure: LOWER EXTREMITY ANGIOGRAPHY;  Surgeon: [name], MD;  Location: [place] CV LAB;  Service: Cardiovascular;  Laterality: Right;  . LOWER EXTREMITY ANGIOGRAPHY Right 01/13/2018   Procedure: LOWER EXTREMITY ANGIOGRAPHY;  Surgeon: [name], MD;  Location: [place] CV LAB;  Service: Cardiovascular;  Laterality: Right;  . LOWER EXTREMITY INTERVENTION  10/25/2017   Procedure: LOWER EXTREMITY INTERVENTION;  Surgeon: [name], MD;  Location: [place] CV LAB;  Service: Cardiovascular;;  . TONSILLECTOMY    . WOUND DEBRIDEMENT Right 08/08/2018   Procedure: DEBRIDEMENT WOUND WITH WOUND VAC APPLICATION;  Surgeon: [name], MD;  Location: [facility] ORS;  Service: Vascular;  Laterality: Right;    Family History  Problem Relation Age of Onset  . Diabetes Father   . Hypertension Father   . Hyperlipidemia Father     Social History   Socioeconomic History  . Marital status: Married    Spouse name: Not on file  . Number of children: 0  . Years of education: Not on file  . Highest education level: Associate degree: academic program  Occupational History  . Not on file  Social Needs  . Financial resource strain: Somewhat hard  . Food insecurity:    Worry: Never true    Inability: Never true  . Transportation needs:     Medical: No    Non-medical: No  Tobacco Use  . Smoking status: Former Smoker    Packs/day: 1.00    Years: 30.00    Pack years: 30.00    Types: Cigarettes    Last attempt to quit: 01/09/2018    Years since quitting: 0.9  . Smokeless tobacco: Never Used  Substance and Sexual Activity  . Alcohol use: Never    Alcohol/week: 0.0 standard drinks    Frequency: Never    Comment: occasionally beer   . Drug use: No  . Sexual activity: Yes  Lifestyle  . Physical activity:    Days per week: 0 days    Minutes per session: 0 min  . Stress: Not at all  Relationships  . Social connections:    Talks on phone: More than three times a week    Gets together: Never    Attends religious service: Never    Active member of club or organization: No    Attends meetings of clubs or organizations: Never    Relationship status: Married  . Intimate partner violence:    Fear  of current or ex partner: No    Emotionally abused: No    Physically abused: No    Forced sexual activity: No  Other Topics Concern  . Not on file  Social History Narrative  . Not on file    Current Outpatient Medications on File Prior to Visit  Medication Sig Dispense Refill  . acetaminophen (TYLENOL) 500 MG tablet Take 1,000 mg by mouth every 6 (six) hours as needed (for pain.).    Marland Kitchen apixaban (ELIQUIS) 5 MG TABS tablet Take 1 tablet (5 mg total) by mouth 2 (two) times daily. 180 tablet 1  . aspirin 81 MG EC tablet Take 1 tablet (81 mg total) by mouth daily. 90 tablet 4  . Biotin (BIOTIN 5000) 5 MG CAPS Take 1 capsule by mouth daily.    . Calcium Carb-Cholecalciferol (CALCIUM 600-D PO) Take by mouth daily.    . cholecalciferol (VITAMIN D3) 25 MCG (1000 UT) tablet Take 5,000 Units by mouth daily.     Marland Kitchen donepezil (ARICEPT) 5 MG tablet Take 5 mg by mouth at bedtime.    . empagliflozin (JARDIANCE) 10 MG TABS tablet Take 5 mg by mouth daily. 90 tablet 1  . ezetimibe-simvastatin (VYTORIN) 10-40 MG tablet Take 1 tablet by mouth daily  at 6 PM. 90 tablet 3  . ferrous sulfate (FERROUSUL) 325 (65 FE) MG tablet Take 1 tablet (325 mg total) by mouth daily with breakfast. 90 tablet 3  . gabapentin (NEURONTIN) 800 MG tablet Take 1 tablet (800 mg total) by mouth 3 (three) times daily. 270 tablet 1  . hydrochlorothiazide (MICROZIDE) 12.5 MG capsule Take 1 capsule (12.5 mg total) by mouth daily. (Patient taking differently: Take 12.5 mg by mouth daily. PRN) 90 capsule 1  . lansoprazole (PREVACID) 30 MG capsule Take 30 mg by mouth daily at 12 noon.    . metFORMIN (GLUCOPHAGE) 1000 MG tablet Take 1 tablet (1,000 mg total) by mouth 2 (two) times daily with a meal. 180 tablet 3  . metoprolol tartrate (LOPRESSOR) 25 MG tablet Take 1 tablet (25 mg total) by mouth 2 (two) times daily. 180 tablet 3  . Multiple Vitamin (MULTIVITAMIN WITH MINERALS) TABS tablet Take 1 tablet by mouth daily. 30 tablet 0  . ONE TOUCH ULTRA TEST test strip USE TO TEST BLOOD SUGAR BID 100 each 12  . ONETOUCH DELICA LANCETS 89Q MISC USE AS DIRECTED TO TEST BLOOD SUGAR BID 100 each 12  . vitamin B-12 (CYANOCOBALAMIN) 1000 MCG tablet Take 1,000 mcg by mouth daily.    . vitamin C (ASCORBIC ACID) 500 MG tablet Take 500 mg by mouth daily.     No current facility-administered medications on file prior to visit.     Allergies  Allergen Reactions  . Sodium Pentobarbital [Pentobarbital] Shortness Of Breath  . Lipitor [Atorvastatin] Rash       Observations/Objective: There were no vitals filed for this visit. There is no height or weight on file to calculate BMI.  Physical Exam  Constitutional: He is oriented to person, place, and time. No distress.  HENT:  Head: Normocephalic and atraumatic.  Pulmonary/Chest: Effort normal.  Neurological: He is alert and oriented to person, place, and time.  Psychiatric: Affect normal.    CBC    Component Value Date/Time   WBC 7.6 12/12/2018 1245   RBC 4.68 12/12/2018 1245   HGB 13.1 12/12/2018 1245   HGB 14.2 09/26/2018  1607   HCT 40.5 12/12/2018 1245   HCT 44.6 09/26/2018 1607  PLT 210 12/12/2018 1245   PLT 288 09/26/2018 1607   MCV 86.5 12/12/2018 1245   MCV 81 09/26/2018 1607   MCH 28.0 12/12/2018 1245   MCHC 32.3 12/12/2018 1245   RDW 15.2 12/12/2018 1245   RDW 15.9 (H) 09/26/2018 1607   LYMPHSABS 1.5 12/12/2018 1245   LYMPHSABS 1.5 09/26/2018 1607   MONOABS 0.6 12/12/2018 1245   EOSABS 0.3 12/12/2018 1245   EOSABS 0.3 09/26/2018 1607   BASOSABS 0.0 12/12/2018 1245   BASOSABS 0.1 09/26/2018 1607    CMP     Component Value Date/Time   NA 144 09/26/2018 1607   K 4.5 09/26/2018 1607   CL 99 09/26/2018 1607   CO2 26 09/26/2018 1607   GLUCOSE 126 (H) 09/26/2018 1607   GLUCOSE 168 (H) 08/01/2018 0814   BUN 16 09/26/2018 1607   CREATININE 0.77 09/26/2018 1607   CALCIUM 10.8 (H) 09/26/2018 1607   PROT 7.2 09/26/2018 1607   ALBUMIN 4.8 09/26/2018 1607   AST 26 09/26/2018 1607   AST 26 02/26/2015 0943   ALT 31 09/26/2018 1607   ALT 29 02/26/2015 0943   ALKPHOS 88 09/26/2018 1607   BILITOT 0.7 09/26/2018 1607   GFRNONAA 97 09/26/2018 1607   GFRAA 112 09/26/2018 1607     Assessment and Plan: 1. Iron deficiency anemia due to chronic blood loss   2. Chronic anticoagulation   3. AVM (arteriovenous malformation) of small bowel, acquired   4. Drug-induced constipation     Labs are reviewed and discussed with patient. Iron deficiency anemia, hemoglobin 13.1, stable. Iron panel reveals decreased ferritin level at 22, iron saturation 12.  TIBC 400 02.  Consistent with underlying iron deficiency. Discussed with patient that due to COVID-19 pandemic, I would hold additional IV iron at this point. Recommend patient to increase oral iron supplementation with ferrous sulfate 325 mg 2 twice a day, prescription sent to pharmacy.  Constipation, likely secondary to oral iron supplementation.  Recommend patient to take Colace 100 mg daily.  Prescription sent to pharmacy. ann AVM in the context of  chronic anticoagulation  Recommend patient to continue follow-up with gastroenterology Dr.Anna. Reviewed Dr. Georgeann Oppenheim note on 07/18/2018.  Enteroscopy and a cautery of AVM was discussed.  Patient prefers to watch and see if hemoglobin drops or if stool turns black then he would consider about the procedure.  Follow Up Instructions: 3 months. Lab and MD assessment.    I discussed the assessment and treatment plan with the patient. The patient was provided an opportunity to ask questions and all were answered. The patient agreed with the plan and demonstrated an understanding of the instructions.  The patient was advised to call back or seek an in-person evaluation if the symptoms worsen or if the condition fails to improve as anticipated.    Earlie Server, MD 12/13/2018 8:49 AM

## 2018-12-13 NOTE — Telephone Encounter (Signed)
Patient and wife is informed with Dr Lucky Cowboy medical advice and they stated they will have a discussion on which wound dressing they will use

## 2018-12-18 DIAGNOSIS — R69 Illness, unspecified: Secondary | ICD-10-CM | POA: Diagnosis not present

## 2018-12-19 ENCOUNTER — Telehealth: Payer: Self-pay

## 2018-12-19 NOTE — Telephone Encounter (Signed)
   Primary Cardiologist: Ida Rogue, MD  Chart reviewed as part of pre-operative protocol coverage. Patient was contacted 12/19/2018 in reference to pre-operative risk assessment for pending surgery as outlined below.  Jenesis B Willcutt Jr. was last seen on 10/03/18 by Dr, Rockey Situ.  Since that day, Rigoberto B [employer]. has done well. He can't complete 4.0 METS due to amputation and being in a wheelchair.   Clearance did not request guidance on holding eliquis. I will reach out to our pharmacy staff. In the meantime, I will fax this medical clearance to the requesting office.   Therefore, based on ACC/AHA guidelines, the patient would be at acceptable risk for the planned procedure without further cardiovascular testing.   I will route this recommendation to the requesting party via Epic fax function and remove from pre-op pool.  Please call with questions.  [place], PA 12/19/2018, 5:13 PM

## 2018-12-19 NOTE — Telephone Encounter (Signed)
    [facility] Pre-operative Risk Assessment    Request for surgical clearance:  What type of surgery is being performed? RIGHT LEG SYNTHETIC SKIN GRAFT  1. When is this surgery scheduled? TBD  2. What type of clearance is required (medical clearance vs. Pharmacy clearance to hold med vs. Both)? NOT LISTED  3. Are there any medications that need to be held prior to surgery and how long? NOT LISTED  4. Practice name and name of physician performing surgery? [facility] , DR. Lucky Cowboy  5. What is your office phone number [phone]   7.   What is your office fax number [phone]  8.   Anesthesia type (None, local, MAC, general) ? NOT LISTED   Lucienne Minks 12/19/2018, 3:11 PM  _________________________________________________________________   (provider comments below)

## 2018-12-20 ENCOUNTER — Telehealth (INDEPENDENT_AMBULATORY_CARE_PROVIDER_SITE_OTHER): Payer: Self-pay

## 2018-12-20 NOTE — Telephone Encounter (Signed)
Patient with diagnosis of afib on Eliquis for anticoagulation.    Procedure: RIGHT LEG SYNTHETIC SKIN GRAFT Date of procedure: TBD  CHADS2-VASc score of  5 (CHF, HTN, AGE, DM2, stroke/tia x 2, CAD, AGE, male)  CrCl 63ml/min  Per office protocol, patient can hold Eliquis for 1 day prior to procedure.

## 2018-12-20 NOTE — Telephone Encounter (Signed)
        Primary Cardiologist: Ida Rogue, MD  Chart reviewed as part of pre-operative protocol coverage. Patient was contacted 12/19/2018 in reference to pre-operative risk assessment for pending surgery as outlined below.  Glenn B Antonelli Jr. was last seen on 10/03/18 by Dr, Rockey Situ.  Since that day, Glenn B [employer]. has done well. He can't complete 4.0 METS due to amputation and being in a wheelchair.   Clearance did not request guidance on holding eliquis. However, per our pharmacy staff:  Patient with diagnosis of afib on Eliquis for anticoagulation.    Procedure: RIGHT LEG SYNTHETIC SKIN GRAFT Date of procedure: TBD  CHADS2-VASc score of  5 (CHF, HTN, AGE, DM2, stroke/tia x 2, CAD, AGE, male)  CrCl 77ml/min  Per office protocol, patient can hold Eliquis for 1 day prior to procedure  Therefore, based on ACC/AHA guidelines, the patient would be at acceptable risk for the planned procedure without further cardiovascular testing.   I will route this recommendation to the requesting party via Epic fax function and remove from pre-op pool.  Please call with questions.  [place], PA 12/19/2018, 5:13 PM

## 2018-12-20 NOTE — Telephone Encounter (Signed)
Spoke with the patient's wife about patient being scheduled for surgery on 12/28/2018 with Dr. Lucky Cowboy. I explained about the zero visitor policy and the Covid testing and pre-op on 12/23/2018 @ 11:00 am.

## 2018-12-22 ENCOUNTER — Other Ambulatory Visit (INDEPENDENT_AMBULATORY_CARE_PROVIDER_SITE_OTHER): Payer: Self-pay | Admitting: Nurse Practitioner

## 2018-12-23 ENCOUNTER — Other Ambulatory Visit: Payer: Self-pay

## 2018-12-23 ENCOUNTER — Encounter
Admission: RE | Admit: 2018-12-23 | Discharge: 2018-12-23 | Disposition: A | Discharge status: Home / Self Care | Payer: Medicare HMO | Source: Ambulatory Visit | Attending: Vascular Surgery | Admitting: Vascular Surgery

## 2018-12-23 ENCOUNTER — Other Ambulatory Visit
Admission: RE | Admit: 2018-12-23 | Discharge: 2018-12-23 | Disposition: A | Discharge status: Home / Self Care | Payer: Medicare HMO | Source: Ambulatory Visit | Attending: Vascular Surgery | Admitting: Vascular Surgery

## 2018-12-23 DIAGNOSIS — Z89519 Acquired absence of unspecified leg below knee: Secondary | ICD-10-CM | POA: Diagnosis not present

## 2018-12-23 DIAGNOSIS — Z1159 Encounter for screening for other viral diseases: Secondary | ICD-10-CM | POA: Insufficient documentation

## 2018-12-23 DIAGNOSIS — Z01812 Encounter for preprocedural laboratory examination: Secondary | ICD-10-CM | POA: Diagnosis not present

## 2018-12-23 LAB — BASIC METABOLIC PANEL
Anion gap: 11 (ref 5–15)
BUN: 16 mg/dL (ref 8–23)
CO2: 29 mmol/L (ref 22–32)
Calcium: 10 mg/dL (ref 8.9–10.3)
Chloride: 101 mmol/L (ref 98–111)
Creatinine, Ser: 0.52 mg/dL — ABNORMAL LOW (ref 0.61–1.24)
GFR calc Af Amer: >60 mL/min (ref >60)
GFR calc non Af Amer: >60 mL/min (ref >60)
Glucose, Bld: 131 mg/dL — ABNORMAL HIGH (ref 70–99)
Potassium: 4.2 mmol/L (ref 3.5–5.1)
Sodium: 141 mmol/L (ref 135–145)

## 2018-12-23 LAB — CBC WITH DIFFERENTIAL/PLATELET
Abs Immature Granulocytes: 0.03 10*3/uL (ref 0.00–0.07)
Basophils Absolute: 0 10*3/uL (ref 0.0–0.1)
Basophils Relative: 0 %
Eosinophils Absolute: 0.3 10*3/uL (ref 0.0–0.5)
Eosinophils Relative: 4 %
HCT: 42.4 % (ref 39.0–52.0)
Hemoglobin: 13.4 g/dL (ref 13.0–17.0)
Immature Granulocytes: 0 %
Lymphocytes Relative: 17 %
Lymphs Abs: 1.3 10*3/uL (ref 0.7–4.0)
MCH: 27.9 pg (ref 26.0–34.0)
MCHC: 31.6 g/dL (ref 30.0–36.0)
MCV: 88.1 fL (ref 80.0–100.0)
Monocytes Absolute: 0.6 10*3/uL (ref 0.1–1.0)
Monocytes Relative: 7 %
Neutro Abs: 5.6 10*3/uL (ref 1.7–7.7)
Neutrophils Relative %: 72 %
Platelets: 264 10*3/uL (ref 150–400)
RBC: 4.81 MIL/uL (ref 4.22–5.81)
RDW: 14.4 % (ref 11.5–15.5)
WBC: 7.9 10*3/uL (ref 4.0–10.5)
nRBC: 0 % (ref 0.0–0.2)

## 2018-12-23 LAB — TYPE AND SCREEN
ABO/RH(D): B POS
Antibody Screen: NEGATIVE

## 2018-12-23 LAB — PROTIME-INR
INR: 1.2 (ref 0.8–1.2)
Prothrombin Time: 14.9 seconds (ref 11.4–15.2)

## 2018-12-23 LAB — APTT: aPTT: 71 seconds — ABNORMAL HIGH (ref 24–36)

## 2018-12-23 NOTE — Patient Instructions (Signed)
Your procedure is scheduled on: Wed. 5/27 Report to Day Surgery. To find out your arrival time please call [phone] between 1PM - 3PM on Tues 5/26.  Remember: Instructions that are not followed completely may result in serious medical risk,  up to and including death, or upon the discretion of your surgeon and anesthesiologist your  surgery may need to be rescheduled.     _X__ 1. Do not eat food after midnight the night before your procedure.                 No gum chewing or hard candies. You may drink clear liquids up to 2 hours                 before you are scheduled to arrive for your surgery- DO not drink clear                 liquids within 2 hours of the start of your surgery.                 Clear Liquids include:  water, Black coffee  __X__2.  On the morning of surgery brush your teeth with toothpaste and water, you                may rinse your mouth with mouthwash if you wish.  Do not swallow any toothpaste of mouthwash.     ___ 3.  No Alcohol for 24 hours before or after surgery.   ___ 4.  Do Not Smoke or use e-cigarettes For 24 Hours Prior to Your Surgery.                 Do not use any chewable tobacco products for at least 6 hours prior to                 surgery.  ____  5.  Bring all medications with you on the day of surgery if instructed.   __x__  6.  Notify your doctor if there is any change in your medical condition      (cold, fever, infections).     Do not wear jewelry, make-up, hairpins, clips or nail polish. Do not wear lotions, powders, or perfumes. You may wear deodorant. Do not shave 48 hours prior to surgery. Men may shave face and neck. Do not bring valuables to the hospital.    [facility] is not responsible for any belongings or valuables.  Contacts, dentures or bridgework may not be worn into surgery. Leave your suitcase in the car. After surgery it may be brought to your room. For patients admitted to the hospital,  discharge time is determined by your treatment team.   Patients discharged the day of surgery will not be allowed to drive home.   Please read over the following fact sheets that you were given:    _x___ Take these medicines the morning of surgery with A SIP OF WATER:    1. acetaminophen (TYLENOL) 500 MG tablet if needed  2. gabapentin (NEURONTIN) 800 MG tablet  3. lansoprazole (PREVACID) 30 MG capsule take morning of surgery instead of 12:00  4.metoprolol tartrate (LOPRESSOR) 25 MG tablet  5.  6.  ____ Fleet Enema (as directed)   _x___ Use CHG Soap as directed  ____ Use inhalers on the day of surgery  _x___ Stop metformin 2 days prior to surgery Last dose On sunday 5/24   ____ Take 1/2 of usual insulin dose the night before surgery.  No insulin the morning          of surgery.   _x___ Stop apixaban (ELIQUIS) 5 MG TABS tablet last dose Sat 5/23  __x__ Stop Anti-inflammatories  Ibuprofen or aleve  Tylenol only until after surgery   _x___ Stop supplements until after surgery. Biotin (BIOTIN 5000) 5 MG CAP,vitamin C (ASCORBIC ACID) 500 MG tablet  ____ Bring C-Pap to the hospital.

## 2018-12-24 LAB — NOVEL CORONAVIRUS, NAA (HOSP ORDER, SEND-OUT TO REF LAB; TAT 18-24 HRS): SARS-CoV-2, NAA: NOT DETECTED

## 2018-12-26 DIAGNOSIS — R69 Illness, unspecified: Secondary | ICD-10-CM | POA: Diagnosis not present

## 2018-12-27 ENCOUNTER — Ambulatory Visit (INDEPENDENT_AMBULATORY_CARE_PROVIDER_SITE_OTHER): Payer: Medicare HMO | Admitting: Family Medicine

## 2018-12-27 ENCOUNTER — Other Ambulatory Visit: Payer: Self-pay

## 2018-12-27 ENCOUNTER — Encounter: Payer: Self-pay | Admitting: Family Medicine

## 2018-12-27 VITALS — BP 119/74 | HR 94 | Temp 98.5°F

## 2018-12-27 DIAGNOSIS — I70209 Unspecified atherosclerosis of native arteries of extremities, unspecified extremity: Secondary | ICD-10-CM

## 2018-12-27 DIAGNOSIS — E1151 Type 2 diabetes mellitus with diabetic peripheral angiopathy without gangrene: Secondary | ICD-10-CM | POA: Diagnosis not present

## 2018-12-27 DIAGNOSIS — I129 Hypertensive chronic kidney disease with stage 1 through stage 4 chronic kidney disease, or unspecified chronic kidney disease: Secondary | ICD-10-CM | POA: Diagnosis not present

## 2018-12-27 DIAGNOSIS — E782 Mixed hyperlipidemia: Secondary | ICD-10-CM | POA: Diagnosis not present

## 2018-12-27 MED ORDER — APIXABAN 5 MG PO TABS: 5.0000 mg | ORAL_TABLET | Freq: Two times a day (BID) | ORAL | 1 refills | Status: DC

## 2018-12-27 MED ORDER — GABAPENTIN 800 MG PO TABS: 800.0000 mg | ORAL_TABLET | Freq: Three times a day (TID) | ORAL | 1 refills | Status: DC

## 2018-12-27 MED ORDER — CEFAZOLIN SODIUM-DEXTROSE 2-4 GM/100ML-% IV SOLN
2.0000 g | INTRAVENOUS | Status: AC
  Administered 2018-12-28: 2 g via INTRAVENOUS

## 2018-12-27 MED ORDER — METOPROLOL TARTRATE 25 MG PO TABS: 25.0000 mg | ORAL_TABLET | Freq: Two times a day (BID) | ORAL | 3 refills | Status: DC

## 2018-12-27 MED ORDER — HYDROCHLOROTHIAZIDE 12.5 MG PO CAPS: 12.5000 mg | ORAL_CAPSULE | Freq: Every day | ORAL | 1 refills | Status: DC

## 2018-12-27 MED ORDER — EMPAGLIFLOZIN 10 MG PO TABS: 5.0000 mg | ORAL_TABLET | Freq: Every day | ORAL | 1 refills | Status: DC

## 2018-12-27 MED ORDER — METFORMIN HCL 1000 MG PO TABS: 1000.0000 mg | ORAL_TABLET | Freq: Two times a day (BID) | ORAL | 3 refills | Status: DC

## 2018-12-27 NOTE — Assessment & Plan Note (Signed)
Under good control on current regimen, A1c came back at 6.6 today. Continue current regimen. Continue to monitor. Call with any concerns. Refills given. Labs drawn today.

## 2018-12-27 NOTE — Assessment & Plan Note (Signed)
Under good control on current regimen. Continue current regimen. Continue to monitor. Call with any concerns. Refills given. Labs drawn today.   

## 2018-12-27 NOTE — Progress Notes (Signed)
BP 119/74   Pulse 94   Temp 98.5 F (36.9 C) (Oral)   SpO2 98%    Subjective:    Patient ID: Glenn Gerold., male    DOB: [birth year], 63 y.o.   MRN: [id number]  HPI: Glenn Russom. is a 63 y.o. male  Chief Complaint  Patient presents with  . Follow-up  . Health Maintenance    Needs Monofilament, Tdap   HYPERTENSION / HYPERLIPIDEMIA Satisfied with current treatment? yes Duration of hypertension: chronic BP monitoring frequency: not checking BP medication side effects: no Past BP meds: metoprolol, HCTZ,  Duration of hyperlipidemia: chronic Cholesterol medication side effects: no Cholesterol supplements: none Past cholesterol medications: vytoran Medication compliance: excellent compliance Aspirin: yes Recent stressors: no Recurrent headaches: no Visual changes: no Palpitations: no Dyspnea: no Chest pain: no Lower extremity edema: no Dizzy/lightheaded: no  DIABETES Hypoglycemic episodes:no Polydipsia/polyuria: no Visual disturbance: no Chest pain: no Paresthesias: no Glucose Monitoring: yes  Accucheck frequency: Not Checking Taking Insulin?: no Blood Pressure Monitoring: not checking Retinal Examination: Not up to Date Foot Exam: Up to Date Diabetic Education: Completed Pneumovax: unknown Influenza: Up to Date Aspirin: no  Relevant past medical, surgical, family and social history reviewed and updated as indicated. Interim medical history since our last visit reviewed. Allergies and medications reviewed and updated.  Review of Systems  Constitutional: Negative.   Respiratory: Negative.   Cardiovascular: Negative.   Musculoskeletal: Negative.   Neurological: Negative.   Psychiatric/Behavioral: Negative.     Per HPI unless specifically indicated above     Objective:    BP 119/74   Pulse 94   Temp 98.5 F (36.9 C) (Oral)   SpO2 98%   Wt Readings from Last 3 Encounters:  12/23/18 180 lb (81.6 kg)  10/07/18 185 lb (83.9 kg)   10/03/18 185 lb (83.9 kg)    Physical Exam Vitals signs and nursing note reviewed.  Constitutional:      General: He is not in acute distress.    Appearance: Normal appearance. He is not ill-appearing, toxic-appearing or diaphoretic.  HENT:     Head: Normocephalic and atraumatic.     Right Ear: External ear normal.     Left Ear: External ear normal.     Nose: Nose normal.     Mouth/Throat:     Mouth: Mucous membranes are moist.     Pharynx: Oropharynx is clear.  Eyes:     General: No scleral icterus.       Right eye: No discharge.        Left eye: No discharge.     Extraocular Movements: Extraocular movements intact.     Conjunctiva/sclera: Conjunctivae normal.     Pupils: Pupils are equal, round, and reactive to light.  Neck:     Musculoskeletal: Normal range of motion and neck supple.  Cardiovascular:     Rate and Rhythm: Normal rate and regular rhythm.     Pulses: Normal pulses.     Heart sounds: Normal heart sounds. No murmur. No friction rub. No gallop.   Pulmonary:     Effort: Pulmonary effort is normal. No respiratory distress.     Breath sounds: Normal breath sounds. No stridor. No wheezing, rhonchi or rales.  Chest:     Chest wall: No tenderness.  Musculoskeletal: Normal range of motion.     Comments: R ABK   Skin:    General: Skin is warm and dry.     Capillary Refill: Capillary refill takes  less than 2 seconds.     Coloration: Skin is not jaundiced or pale.     Findings: No bruising, erythema, lesion or rash.  Neurological:     General: No focal deficit present.     Mental Status: He is alert and oriented to person, place, and time. Mental status is at baseline.  Psychiatric:        Mood and Affect: Mood normal.        Behavior: Behavior normal.        Thought Content: Thought content normal.        Judgment: Judgment normal.     Results for orders placed or performed during the hospital encounter of 12/23/18  APTT  Result Value Ref Range   aPTT 71  (H) 24 - 36 seconds  Basic metabolic panel  Result Value Ref Range   Sodium 141 135 - 145 mmol/L   Potassium 4.2 3.5 - 5.1 mmol/L   Chloride 101 98 - 111 mmol/L   CO2 29 22 - 32 mmol/L   Glucose, Bld 131 (H) 70 - 99 mg/dL   BUN 16 8 - 23 mg/dL   Creatinine, Ser 0.52 (L) 0.61 - 1.24 mg/dL   Calcium 10.0 8.9 - 10.3 mg/dL   GFR calc non Af Amer >60 >60 mL/min   GFR calc Af Amer >60 >60 mL/min   Anion gap 11 5 - 15  CBC WITH DIFFERENTIAL  Result Value Ref Range   WBC 7.9 4.0 - 10.5 K/uL   RBC 4.81 4.22 - 5.81 MIL/uL   Hemoglobin 13.4 13.0 - 17.0 g/dL   HCT 42.4 39.0 - 52.0 %   MCV 88.1 80.0 - 100.0 fL   MCH 27.9 26.0 - 34.0 pg   MCHC 31.6 30.0 - 36.0 g/dL   RDW 14.4 11.5 - 15.5 %   Platelets 264 150 - 400 K/uL   nRBC 0.0 0.0 - 0.2 %   Neutrophils Relative % 72 %   Neutro Abs 5.6 1.7 - 7.7 K/uL   Lymphocytes Relative 17 %   Lymphs Abs 1.3 0.7 - 4.0 K/uL   Monocytes Relative 7 %   Monocytes Absolute 0.6 0.1 - 1.0 K/uL   Eosinophils Relative 4 %   Eosinophils Absolute 0.3 0.0 - 0.5 K/uL   Basophils Relative 0 %   Basophils Absolute 0.0 0.0 - 0.1 K/uL   Immature Granulocytes 0 %   Abs Immature Granulocytes 0.03 0.00 - 0.07 K/uL  Protime-INR  Result Value Ref Range   Prothrombin Time 14.9 11.4 - 15.2 seconds   INR 1.2 0.8 - 1.2  Type and screen  Result Value Ref Range   ABO/RH(D) B POS    Antibody Screen NEG    Sample Expiration 01/06/2019,2359    Extend sample reason      NO TRANSFUSIONS OR PREGNANCY IN THE PAST 3 MONTHS Performed at [facility], [address] [place],  [place] [address]       Assessment & Plan:   Problem List Items Addressed This Visit      Cardiovascular and Mediastinum   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 Continue current regimen. Continue to monitor. Call with any concerns. Refills given. Labs drawn today.       Relevant Medications   apixaban (ELIQUIS) 5 MG TABS tablet   empagliflozin (JARDIANCE) 10 MG TABS tablet   hydrochlorothiazide (MICROZIDE)  12.5 MG capsule   metFORMIN (GLUCOPHAGE) 1000 MG tablet   metoprolol tartrate (LOPRESSOR) 25 MG tablet   Other Relevant Orders   Bayer DCA Hb A1c Waived   CBC with Differential/Platelet   Comprehensive metabolic panel   Microalbumin, Urine Waived     Genitourinary   Benign hypertensive renal disease    Under good control on current regimen. Continue current regimen. Continue to monitor. Call with any concerns. Refills given. Labs drawn today.      Relevant Orders   CBC with Differential/Platelet   Comprehensive metabolic panel   Microalbumin, Urine Waived     Other   Hyperlipidemia    Under good control on current regimen. Continue current regimen. Continue to monitor. Call with any concerns. Refills given. Labs drawn today.       Relevant Medications   apixaban (ELIQUIS) 5 MG TABS tablet   hydrochlorothiazide (MICROZIDE) 12.5 MG capsule   metoprolol tartrate (LOPRESSOR) 25 MG tablet   Other Relevant Orders   CBC with Differential/Platelet   Comprehensive metabolic panel   Lipid Panel w/o Chol/HDL Ratio       Follow up plan: Return in about 6 months (around 06/29/2019) for Physical.

## 2018-12-28 ENCOUNTER — Encounter: Payer: Self-pay | Admitting: *Deleted

## 2018-12-28 ENCOUNTER — Other Ambulatory Visit: Payer: Self-pay

## 2018-12-28 ENCOUNTER — Ambulatory Visit: Payer: Medicare HMO | Admitting: Anesthesiology

## 2018-12-28 ENCOUNTER — Encounter
Admission: RE | Disposition: A | Discharge status: Home / Self Care | Payer: Self-pay | Source: Home / Self Care | Attending: Vascular Surgery

## 2018-12-28 ENCOUNTER — Ambulatory Visit
Admission: RE | Admit: 2018-12-28 | Discharge: 2018-12-28 | Disposition: A | Discharge status: Home / Self Care | Payer: Medicare HMO | Attending: Vascular Surgery | Admitting: Vascular Surgery

## 2018-12-28 DIAGNOSIS — K219 Gastro-esophageal reflux disease without esophagitis: Secondary | ICD-10-CM | POA: Insufficient documentation

## 2018-12-28 DIAGNOSIS — T8781 Dehiscence of amputation stump: Secondary | ICD-10-CM | POA: Insufficient documentation

## 2018-12-28 DIAGNOSIS — Z79899 Other long term (current) drug therapy: Secondary | ICD-10-CM | POA: Diagnosis not present

## 2018-12-28 DIAGNOSIS — E1151 Type 2 diabetes mellitus with diabetic peripheral angiopathy without gangrene: Secondary | ICD-10-CM | POA: Diagnosis not present

## 2018-12-28 DIAGNOSIS — E785 Hyperlipidemia, unspecified: Secondary | ICD-10-CM | POA: Insufficient documentation

## 2018-12-28 DIAGNOSIS — Z87891 Personal history of nicotine dependence: Secondary | ICD-10-CM | POA: Insufficient documentation

## 2018-12-28 DIAGNOSIS — Z7901 Long term (current) use of anticoagulants: Secondary | ICD-10-CM | POA: Diagnosis not present

## 2018-12-28 DIAGNOSIS — Z899 Acquired absence of limb, unspecified: Secondary | ICD-10-CM | POA: Insufficient documentation

## 2018-12-28 DIAGNOSIS — I10 Essential (primary) hypertension: Secondary | ICD-10-CM | POA: Diagnosis not present

## 2018-12-28 DIAGNOSIS — D759 Disease of blood and blood-forming organs, unspecified: Secondary | ICD-10-CM | POA: Insufficient documentation

## 2018-12-28 DIAGNOSIS — D649 Anemia, unspecified: Secondary | ICD-10-CM | POA: Insufficient documentation

## 2018-12-28 DIAGNOSIS — E669 Obesity, unspecified: Secondary | ICD-10-CM | POA: Insufficient documentation

## 2018-12-28 DIAGNOSIS — Z6827 Body mass index (BMI) 27.0-27.9, adult: Secondary | ICD-10-CM | POA: Diagnosis not present

## 2018-12-28 DIAGNOSIS — I77819 Aortic ectasia, unspecified site: Secondary | ICD-10-CM | POA: Diagnosis not present

## 2018-12-28 DIAGNOSIS — T8743 Infection of amputation stump, right lower extremity: Secondary | ICD-10-CM | POA: Diagnosis not present

## 2018-12-28 DIAGNOSIS — Z8673 Personal history of transient ischemic attack (TIA), and cerebral infarction without residual deficits: Secondary | ICD-10-CM | POA: Insufficient documentation

## 2018-12-28 DIAGNOSIS — Z7984 Long term (current) use of oral hypoglycemic drugs: Secondary | ICD-10-CM | POA: Diagnosis not present

## 2018-12-28 DIAGNOSIS — I480 Paroxysmal atrial fibrillation: Secondary | ICD-10-CM | POA: Insufficient documentation

## 2018-12-28 DIAGNOSIS — Z7982 Long term (current) use of aspirin: Secondary | ICD-10-CM | POA: Diagnosis not present

## 2018-12-28 DIAGNOSIS — E1142 Type 2 diabetes mellitus with diabetic polyneuropathy: Secondary | ICD-10-CM | POA: Insufficient documentation

## 2018-12-28 DIAGNOSIS — T8789 Other complications of amputation stump: Secondary | ICD-10-CM | POA: Diagnosis not present

## 2018-12-28 DIAGNOSIS — Z89511 Acquired absence of right leg below knee: Secondary | ICD-10-CM | POA: Diagnosis not present

## 2018-12-28 DIAGNOSIS — E1140 Type 2 diabetes mellitus with diabetic neuropathy, unspecified: Secondary | ICD-10-CM | POA: Diagnosis not present

## 2018-12-28 HISTORY — PX: SKIN SPLIT GRAFT: SHX444

## 2018-12-28 LAB — LIPID PANEL W/O CHOL/HDL RATIO
Cholesterol, Total: 119 mg/dL (ref 100–199)
HDL: 48 mg/dL (ref >39)
LDL Calculated: 54 mg/dL (ref 0–99)
Triglycerides: 85 mg/dL (ref 0–149)
VLDL Cholesterol Cal: 17 mg/dL (ref 5–40)

## 2018-12-28 LAB — COMPREHENSIVE METABOLIC PANEL
ALT: 23 IU/L (ref 0–44)
AST: 23 IU/L (ref 0–40)
Albumin/Globulin Ratio: 1.8 (ref 1.2–2.2)
Albumin: 4.7 g/dL (ref 3.8–4.8)
Alkaline Phosphatase: 91 IU/L (ref 39–117)
BUN/Creatinine Ratio: 22 (ref 10–24)
BUN: 15 mg/dL (ref 8–27)
Bilirubin Total: 0.5 mg/dL (ref 0.0–1.2)
CO2: 23 mmol/L (ref 20–29)
Calcium: 10 mg/dL (ref 8.6–10.2)
Chloride: 100 mmol/L (ref 96–106)
Creatinine, Ser: 0.69 mg/dL — ABNORMAL LOW (ref 0.76–1.27)
GFR calc Af Amer: 116 mL/min/{1.73_m2} (ref >59)
GFR calc non Af Amer: 100 mL/min/{1.73_m2} (ref >59)
Globulin, Total: 2.6 g/dL (ref 1.5–4.5)
Glucose: 141 mg/dL — ABNORMAL HIGH (ref 65–99)
Potassium: 4.3 mmol/L (ref 3.5–5.2)
Sodium: 141 mmol/L (ref 134–144)
Total Protein: 7.3 g/dL (ref 6.0–8.5)

## 2018-12-28 LAB — CBC WITH DIFFERENTIAL/PLATELET
Basophils Absolute: 0.1 10*3/uL (ref 0.0–0.2)
Basos: 1 %
EOS (ABSOLUTE): 0.3 10*3/uL (ref 0.0–0.4)
Eos: 4 %
Hematocrit: 39.3 % (ref 37.5–51.0)
Hemoglobin: 13.1 g/dL (ref 13.0–17.7)
Immature Grans (Abs): 0 10*3/uL (ref 0.0–0.1)
Immature Granulocytes: 0 %
Lymphocytes Absolute: 1.7 10*3/uL (ref 0.7–3.1)
Lymphs: 21 %
MCH: 28 pg (ref 26.6–33.0)
MCHC: 33.3 g/dL (ref 31.5–35.7)
MCV: 84 fL (ref 79–97)
Monocytes Absolute: 0.5 10*3/uL (ref 0.1–0.9)
Monocytes: 6 %
Neutrophils Absolute: 5.2 10*3/uL (ref 1.4–7.0)
Neutrophils: 68 %
Platelets: 257 10*3/uL (ref 150–450)
RBC: 4.68 x10E6/uL (ref 4.14–5.80)
RDW: 13.5 % (ref 11.6–15.4)
WBC: 7.8 10*3/uL (ref 3.4–10.8)

## 2018-12-28 LAB — MICROALBUMIN, URINE WAIVED
Creatinine, Urine Waived: 10 mg/dL (ref 10–300)
Microalb, Ur Waived: 10 mg/L (ref 0–19)
Microalb/Creat Ratio: <30 mg/g (ref <30)

## 2018-12-28 LAB — GLUCOSE, CAPILLARY
Glucose-Capillary: 131 mg/dL — ABNORMAL HIGH (ref 70–99)
Glucose-Capillary: 120 mg/dL — ABNORMAL HIGH (ref 70–99)

## 2018-12-28 LAB — BAYER DCA HB A1C WAIVED: HB A1C (BAYER DCA - WAIVED): 6.6 % (ref <7.0)

## 2018-12-28 SURGERY — APPLICATION, GRAFT, SKIN, SPLIT-THICKNESS
Anesthesia: General | Laterality: Right

## 2018-12-28 MED ORDER — PROPOFOL 10 MG/ML IV BOLUS
INTRAVENOUS | Status: AC
  Filled 2018-12-28: qty 20

## 2018-12-28 MED ORDER — ONDANSETRON HCL 4 MG/2ML IJ SOLN
INTRAMUSCULAR | Status: AC
  Filled 2018-12-28: qty 2

## 2018-12-28 MED ORDER — SODIUM CHLORIDE 0.9 % IV SOLN
INTRAVENOUS | Status: DC
  Administered 2018-12-28: 08:00:00 via INTRAVENOUS

## 2018-12-28 MED ORDER — CEFAZOLIN SODIUM-DEXTROSE 2-4 GM/100ML-% IV SOLN
INTRAVENOUS | Status: AC
  Filled 2018-12-28: qty 100

## 2018-12-28 MED ORDER — LIDOCAINE HCL (CARDIAC) PF 100 MG/5ML IV SOSY
PREFILLED_SYRINGE | INTRAVENOUS | Status: DC | PRN
  Administered 2018-12-28: 80 mg via INTRAVENOUS

## 2018-12-28 MED ORDER — PHENYLEPHRINE HCL (PRESSORS) 10 MG/ML IV SOLN
INTRAVENOUS | Status: DC | PRN
  Administered 2018-12-28 (×2): 100 ug via INTRAVENOUS
  Administered 2018-12-28: 200 ug via INTRAVENOUS
  Administered 2018-12-28 (×2): 100 ug via INTRAVENOUS

## 2018-12-28 MED ORDER — HYDROCODONE-ACETAMINOPHEN 5-325 MG PO TABS: 1.0000 | ORAL_TABLET | Freq: Four times a day (QID) | ORAL | 0 refills | Status: DC | PRN

## 2018-12-28 MED ORDER — HYDROCODONE-ACETAMINOPHEN 5-325 MG PO TABS
1.0000 | ORAL_TABLET | Freq: Once | ORAL | Status: AC
  Administered 2018-12-28: 1 via ORAL

## 2018-12-28 MED ORDER — FENTANYL CITRATE (PF) 100 MCG/2ML IJ SOLN
INTRAMUSCULAR | Status: AC
  Filled 2018-12-28: qty 2

## 2018-12-28 MED ORDER — HYDROMORPHONE HCL 1 MG/ML IJ SOLN
0.2500 mg | INTRAMUSCULAR | Status: AC | PRN
  Administered 2018-12-28 (×8): 0.25 mg via INTRAVENOUS

## 2018-12-28 MED ORDER — HYDROMORPHONE HCL 1 MG/ML IJ SOLN: 1.0000 mg | Freq: Once | INTRAMUSCULAR | Status: DC | PRN

## 2018-12-28 MED ORDER — FENTANYL CITRATE (PF) 100 MCG/2ML IJ SOLN
INTRAMUSCULAR | Status: DC | PRN
  Administered 2018-12-28: 50 ug via INTRAVENOUS

## 2018-12-28 MED ORDER — ONDANSETRON HCL 4 MG/2ML IJ SOLN: 4.0000 mg | Freq: Four times a day (QID) | INTRAMUSCULAR | Status: DC | PRN

## 2018-12-28 MED ORDER — ONDANSETRON HCL 4 MG/2ML IJ SOLN
INTRAMUSCULAR | Status: DC | PRN
  Administered 2018-12-28: 4 mg via INTRAVENOUS

## 2018-12-28 MED ORDER — HYDROMORPHONE HCL 1 MG/ML IJ SOLN
INTRAMUSCULAR | Status: AC
  Administered 2018-12-28: 0.25 mg via INTRAVENOUS
  Filled 2018-12-28: qty 1

## 2018-12-28 MED ORDER — PROPOFOL 10 MG/ML IV BOLUS
INTRAVENOUS | Status: DC | PRN
  Administered 2018-12-28: 140 mg via INTRAVENOUS

## 2018-12-28 MED ORDER — CHLORHEXIDINE GLUCONATE CLOTH 2 % EX PADS: 6.0000 | MEDICATED_PAD | Freq: Once | CUTANEOUS | Status: DC

## 2018-12-28 MED ORDER — HYDROMORPHONE HCL 1 MG/ML IJ SOLN
INTRAMUSCULAR | Status: AC
  Administered 2018-12-28: 11:00:00 0.25 mg via INTRAVENOUS
  Filled 2018-12-28: qty 1

## 2018-12-28 MED ORDER — HYDROCODONE-ACETAMINOPHEN 5-325 MG PO TABS
ORAL_TABLET | ORAL | Status: AC
  Filled 2018-12-28: qty 1

## 2018-12-28 SURGICAL SUPPLY — 30 items
BLADE CLIPPER SURG (BLADE) IMPLANT
BLADE DERMATOME SS (BLADE) IMPLANT
CHLORAPREP W/TINT 26 (MISCELLANEOUS) ×2 IMPLANT
COVER WAND RF STERILE (DRAPES) ×2 IMPLANT
DEPRESSOR TONGUE BLADE STERILE (MISCELLANEOUS) IMPLANT
DERMACARRIER 1-1.5 (MISCELLANEOUS) IMPLANT
DERMACARRIER 3-1 (MISCELLANEOUS) IMPLANT
DRAPE LAPAROTOMY 100X77 ABD (DRAPES) ×2 IMPLANT
DRAPE SHEET LG 3/4 BI-LAMINATE (DRAPES) ×2 IMPLANT
DRSG TEGADERM 6X8 (GAUZE/BANDAGES/DRESSINGS) IMPLANT
DRSG TELFA 3X8 NADH (GAUZE/BANDAGES/DRESSINGS) IMPLANT
ELECT REM PT RETURN 9FT ADLT (ELECTROSURGICAL) ×2
ELECTRODE REM PT RTRN 9FT ADLT (ELECTROSURGICAL) ×1 IMPLANT
GAUZE SPONGE 4X4 12PLY STRL (GAUZE/BANDAGES/DRESSINGS) ×2 IMPLANT
GLOVE BIO SURGEON STRL SZ7 (GLOVE) ×2 IMPLANT
GLOVE INDICATOR 7.5 STRL GRN (GLOVE) ×2 IMPLANT
GOWN STRL REUS W/ TWL LRG LVL3 (GOWN DISPOSABLE) ×2 IMPLANT
GOWN STRL REUS W/ TWL XL LVL3 (GOWN DISPOSABLE) ×1 IMPLANT
GOWN STRL REUS W/TWL LRG LVL3 (GOWN DISPOSABLE) ×2
GOWN STRL REUS W/TWL XL LVL3 (GOWN DISPOSABLE) ×1
GRAFT AMNIOX 8.0X3.0 (Orthopedic Graft) ×8 IMPLANT
KIT TURNOVER KIT A (KITS) ×2 IMPLANT
LABEL OR SOLS (LABEL) ×2 IMPLANT
NS IRRIG 500ML POUR BTL (IV SOLUTION) ×2 IMPLANT
PACK BASIN MINOR ARMC (MISCELLANEOUS) ×2 IMPLANT
SOL PREP PVP 2OZ (MISCELLANEOUS) ×2
SOLUTION PREP PVP 2OZ (MISCELLANEOUS) ×1 IMPLANT
SPONGE LAP 18X18 RF (DISPOSABLE) IMPLANT
STAPLER SKIN PROX 35W (STAPLE) ×2 IMPLANT
SUT ETHILON 5-0 FS-2 18 BLK (SUTURE) IMPLANT

## 2018-12-28 NOTE — Anesthesia Preprocedure Evaluation (Addendum)
Anesthesia Evaluation  Patient identified by MRN, date of birth, ID band Patient awake    Reviewed: Allergy & Precautions, NPO status , Patient's Chart, lab work & pertinent test results  History of Anesthesia Complications Negative for: history of anesthetic complications  Airway Mallampati: II  TM Distance: >3 FB Neck ROM: Full    Dental  (+) Poor Dentition, Missing, Edentulous Upper   Pulmonary neg sleep apnea, neg COPD, former smoker,    breath sounds clear to auscultation       Cardiovascular hypertension, Pt. on medications + Peripheral Vascular Disease  (-) CAD, (-) Past MI, (-) Cardiac Stents and (-) CABG + dysrhythmias Atrial Fibrillation  - Systolic murmurs    Neuro/Psych  Headaches,  Neuromuscular disease (alcoholic peripheral neuropathy) CVA, No Residual Symptoms negative psych ROS   GI/Hepatic GERD  ,(+)     substance abuse  alcohol use,   Endo/Other  diabetes, Oral Hypoglycemic Agents  Renal/GU negative Renal ROS     Musculoskeletal negative musculoskeletal ROS (+)   Abdominal (+) - obese,   Peds  Hematology  (+) Blood dyscrasia, anemia ,   Anesthesia Other Findings Past Medical History: 02/17/2015: Acute left PCA stroke ([name]) No date: Anemia 05/08/2018: Atherosclerosis of native arteries of the extremities with  gangrene ([place]) No date: Atrial fibrillation ([place]) 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 04/2018 Echo: 4.1cm. Asc Ao 3.5cm. No date: Dysrhythmia 06/02/2018: Gangrene of right foot ([place]) No date: GERD (gastroesophageal reflux disease) No date: History of echocardiogram     Comment:  a. 04/2018 Echo: Ef 60-65%, no rwma, midly to mod dil Ao               root - 4.1cm. Asc Ao 3.5cm. Mild MR. Nl RV fxn. Nl PASP. No date: History of hernia repair No date: History of stress test  Comment:  a. 2016 MV ([name]): EF 58%, no ischemia. No date: Hyperlipidemia No date: Hypertension No date: Leg pain No date: Legally blind 02/21/2018: Necrotic toes ([place]) No date: PAD (peripheral artery disease) ([place]) No date: PAF (paroxysmal atrial fibrillation) ([name])     Comment:  a. on Eliquis as of 2018; b. CHADS2VASc => 5 (HTN, DM,               stroke x 2, vascular disease) No date: Peripheral vascular disease ([place])     Comment:  a. followed by Dr. Lucky Cowboy; b. s/p kissing balloon stents               and right external iliac stent in 10/2017; c. LE               angiogrpahy 01/2018: No significant arterial occlusive               disease in the lower extremities. No date: Pulmonary nodules No date: Stroke [facility])     Comment:  a. 2016 & 2018 07/11/2018: Stroke ([place])   Reproductive/Obstetrics                            Anesthesia Physical  Anesthesia Plan  ASA: III  Anesthesia Plan: General LMA   Post-op Pain Management:    Induction: Intravenous  PONV Risk Score and Plan: 1 and Ondansetron and Midazolam  Airway Management Planned: LMA  Additional Equipment:   Intra-op  Plan:   Post-operative Plan:   Informed Consent: I have reviewed the patients History and Physical, chart, labs and discussed the procedure including the risks, benefits and alternatives for the proposed anesthesia with the patient or authorized representative who has indicated his/her understanding and acceptance.     Dental advisory given  Plan Discussed with: CRNA and Anesthesiologist  Anesthesia Plan Comments:         Anesthesia Quick Evaluation

## 2018-12-28 NOTE — H&P (Signed)
[facility] History & Physical Update  The patient was interviewed and re-examined.  The patient's previous History and Physical has been reviewed and is unchanged.  There is no change in the plan of care. We plan to proceed with the scheduled procedure.  Leotis Pain, MD  12/28/2018, 8:19 AM

## 2018-12-28 NOTE — Progress Notes (Signed)
Ch visited w/ pt in pre-op. 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 Pt stated several times that "I'm ready to get this over with" and "It is what it is". Pt was eager to hv the procedure completed as he shared he was hoping to be fitted for a prosthetic leg soon. Ch allowed pt time to share his struggles w/ phantom limb and provided words of encouragement for his journey towards healing. Ch prayed aloud for the pt/doctors/nurse that would be providing care for him today. Pt appreciated visit and seemed to have less anxiety related to the procedure which he hopes will only take 1.5 hours.  No f/u as the pt shared he would be returning home post-op.    12/28/18 0800  Clinical Encounter Type  Visited With Patient  Visit Type Psychological support;Spiritual support;Social support;Pre-op  Spiritual Encounters  Spiritual Needs Emotional;Grief support  Stress Factors  Patient Stress Factors Health changes;Major life changes  Family Stress Factors None identified

## 2018-12-28 NOTE — Anesthesia Post-op Follow-up Note (Signed)
Anesthesia QCDR form completed.        

## 2018-12-28 NOTE — Transfer of Care (Signed)
Immediate Anesthesia Transfer of Care Note  Patient: [name].  Procedure(s) Performed: SKIN GRAFT SPLIT THICKNESS ( SYNTHETIC ) (Right )  Patient Location: PACU  Anesthesia Type:General  Level of Consciousness: awake, alert  and oriented  Airway & Oxygen Therapy: Patient Spontanous Breathing  Post-op Assessment: Report given to RN and Post -op Vital signs reviewed and stable  Post vital signs: Reviewed and stable  Last Vitals:  Vitals Value Taken Time  BP 120/80 12/28/2018 10:59 AM  Temp    Pulse 89 12/28/2018 11:03 AM  Resp 11 12/28/2018 11:03 AM  SpO2 100 % 12/28/2018 11:03 AM  Vitals shown include unvalidated device data.  Last Pain:  Vitals:   12/28/18 0755  TempSrc: Temporal  PainSc: 5       Patients Stated Pain Goal: 3 (73/40/37 0964)  Complications: No apparent anesthesia complications

## 2018-12-28 NOTE — Discharge Instructions (Signed)

## 2018-12-28 NOTE — Op Note (Signed)
[facility]   OPERATIVE NOTE  DATE: 12/28/2018  PRE-OPERATIVE DIAGNOSIS: open wound right BKA  POST-OPERATIVE DIAGNOSIS: Same as above  PROCEDURE: 1.   Synthetic skin graft to the right BKA wound for approximately 100 cm2 using four 8 cm x 3 cm Amniox skin grafts  SURGEON: Leotis Pain, MD  ASSISTANT(S): Hezzie Bump, PA-C  ANESTHESIA: Gen  ESTIMATED BLOOD LOSS: 2 cc  FINDING(S): 1.  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  It measures about 13 cm in longest length and 12-13 cm in widest width. This is clean and healthy and has great granulation tissue. It is ready for skin grafting as it would take an excessive amount of time to expect this to heal on its. To avoid a donor site and to try to promote epithelialization, synthetic skin graft will be used today. Risks and benefits are discussed. An assistant was present during the procedure to help facilitate the exposure and expedite the procedure.  DESCRIPTION: After obtaining full informed written consent, the patient was brought back to the operating room and placed supine upon the operating table.  The patient received IV antibiotics prior to induction. The assistant provided retraction and mobilization to help facilitate exposure and expedite the procedure throughout the entire procedure.  This included following suture, assistance with the dressing and holding the leg, and optimizing lighting.  After obtaining adequate anesthesia, the patient was prepped and draped in the standard fashion. The wound was then roughed up with the back of the Bovie pad to promote some mild bleeding. I then began fashioning the Amniox skin grafts to fit the wound. We perforated these with a scalpel to create somewhat of a meshing. We used a total of four 8 cm x 3 cm grafts and fashioned these to  cover nearly the entire wound with only small gaps between the grafts and between the graft and the skin.  These were secured with staples.  I then secured two pieces of Mepitel over the skin grafts and secured this with staples as well.  Two large Kerlex wraps were then placed around the mepitel. The patient was taken to the recovery room in stable condition.            COMPLICATIONS: None  CONDITION: Stable  Leotis Pain 12/28/2018 10:58 AM    This note was created with Dragon Medical transcription system. Any errors in dictation are purely unintentional.

## 2018-12-29 ENCOUNTER — Telehealth (INDEPENDENT_AMBULATORY_CARE_PROVIDER_SITE_OTHER): Payer: Self-pay

## 2018-12-29 ENCOUNTER — Encounter: Payer: Self-pay | Admitting: Vascular Surgery

## 2018-12-29 NOTE — Telephone Encounter (Signed)
Patient and wife has been made aware with medical advice from Eulogio Ditch NP

## 2018-12-29 NOTE — Anesthesia Postprocedure Evaluation (Signed)
Anesthesia Post Note  Patient: [name].  Procedure(s) Performed: SKIN GRAFT SPLIT THICKNESS ( SYNTHETIC ) (Right )  Patient location during evaluation: PACU Anesthesia Type: General Level of consciousness: awake and alert Pain management: pain level controlled Vital Signs Assessment: post-procedure vital signs reviewed and stable Respiratory status: spontaneous breathing, nonlabored ventilation and respiratory function stable Cardiovascular status: blood pressure returned to baseline and stable Postop Assessment: no apparent nausea or vomiting Anesthetic complications: no     Last Vitals:  Vitals:   12/28/18 1201 12/28/18 1253  BP: 106/65 90/64  Pulse: 94 80  Resp: 16 17  Temp: 36.6 C   SpO2: 97% 98%    Last Pain:  Vitals:   12/29/18 0810  TempSrc:   PainSc: [place]

## 2018-12-29 NOTE — Telephone Encounter (Signed)
That is normal immediately after surgery.  As long as the drainage isn't filled with pus or is foul smelling, dressing changes should continue as needed.

## 2019-01-03 DIAGNOSIS — Z89511 Acquired absence of right leg below knee: Secondary | ICD-10-CM | POA: Diagnosis not present

## 2019-01-05 ENCOUNTER — Telehealth (INDEPENDENT_AMBULATORY_CARE_PROVIDER_SITE_OTHER): Payer: Self-pay

## 2019-01-05 NOTE — Telephone Encounter (Signed)
Patient has been made aware with medical advice from Dr Lucky Cowboy

## 2019-01-08 DIAGNOSIS — I96 Gangrene, not elsewhere classified: Secondary | ICD-10-CM | POA: Diagnosis not present

## 2019-01-08 DIAGNOSIS — Z89511 Acquired absence of right leg below knee: Secondary | ICD-10-CM | POA: Diagnosis not present

## 2019-01-09 ENCOUNTER — Encounter: Payer: Self-pay | Admitting: Vascular Surgery

## 2019-01-09 ENCOUNTER — Telehealth (INDEPENDENT_AMBULATORY_CARE_PROVIDER_SITE_OTHER): Payer: Self-pay | Admitting: Vascular Surgery

## 2019-01-09 NOTE — Telephone Encounter (Signed)
He can come see [facility] tomorrow

## 2019-01-09 NOTE — Telephone Encounter (Signed)
Can you please schedule patient per Arna Medici for tomorrow with Dew. Thank you. AS, CMA

## 2019-01-09 NOTE — Telephone Encounter (Signed)
Patient called on 01/05/19 requesting more pain medication and Dr. Lucky Cowboy said he could not have any more pain medication at this time. Patient complaining of pain on both side of the wound and says the pain is like a burning sensation. Says he is having increased drainage since graft. No discoloration. No pus like drainage. Patient complaining of a 7 on a scale from 1-10. Taking Advil for the pain and not getting any relief. Requesting an apt to be seen 'ASAP'. Please advise. AS, CMA

## 2019-01-10 ENCOUNTER — Other Ambulatory Visit: Payer: Self-pay

## 2019-01-10 ENCOUNTER — Ambulatory Visit (INDEPENDENT_AMBULATORY_CARE_PROVIDER_SITE_OTHER): Payer: Medicare HMO | Admitting: Vascular Surgery

## 2019-01-10 ENCOUNTER — Encounter (INDEPENDENT_AMBULATORY_CARE_PROVIDER_SITE_OTHER): Payer: Self-pay | Admitting: Vascular Surgery

## 2019-01-10 VITALS — BP 124/75 | HR 87 | Resp 16

## 2019-01-10 DIAGNOSIS — M79609 Pain in unspecified limb: Secondary | ICD-10-CM

## 2019-01-10 DIAGNOSIS — T8789 Other complications of amputation stump: Secondary | ICD-10-CM

## 2019-01-10 DIAGNOSIS — Z89511 Acquired absence of right leg below knee: Secondary | ICD-10-CM

## 2019-01-10 DIAGNOSIS — Z89512 Acquired absence of left leg below knee: Secondary | ICD-10-CM

## 2019-01-10 NOTE — Assessment & Plan Note (Addendum)
Status post synthetic skin graft to the large open wound on the base of the amputation.  It actually looks quite good.  Although it is early on, it looks like we are going to have a decent take with a skin graft.  The Adaptic was removed today and we will go with a Xeroform/Vaseline gauze and a Kerlix wrap at this point.  The outer layer can stay on for 3 to 4 days as long as it is not soiled and just have a Xeroform/Vaseline gauze and a Kerlix wrap placed over it for protection.  Recheck him in 3 to 4 weeks

## 2019-01-10 NOTE — Assessment & Plan Note (Signed)
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  To give him some tramadol today but I think he should see the wound center to discuss further options

## 2019-01-10 NOTE — Progress Notes (Signed)
Patient ID: Glenn Gerold., male   DOB: [birth year], 63 y.o.   MRN: [id number]  Chief Complaint  Patient presents with  . Follow-up    check stump area    HPI Glenn B Strahm [name] is a 63 y.o. male.  Patient returns about 2 weeks after synthetic skin graft to his right below-knee amputation wound.  The wound is clean and healthy appearing.  It is too early to tell how much epithelialization is present, but the granulation looks excellent and there does appear to be a thin layer of early skin trying to form.  No sign of infection.  I remove the Adaptic and a handful of staples and with each staple he would scream how much it hurt.  He has had chronic issues with pain that we have not been able to alleviate.   Past Medical History:  Diagnosis Date  . Acute left PCA stroke ([place]) 02/17/2015  . Anemia   . Atherosclerosis of native arteries of the extremities with gangrene ([place]) 05/08/2018  . Atrial fibrillation ([place])   . Blind    right eye  . Diabetes mellitus with complication ([place])   . Dilated aortic root ([place])    a. 04/2018 Echo: 4.1cm. Asc Ao 3.5cm.  Marland Kitchen Dysrhythmia    a fib  . Gangrene of right foot ([place]) 06/02/2018  . GERD (gastroesophageal reflux disease)   . History of echocardiogram    a. 04/2018 Echo: Ef 60-65%, no rwma, midly to mod dil Ao root - 4.1cm. Asc Ao 3.5cm. Mild MR. Nl RV fxn. Nl PASP.  Marland Kitchen History of hernia repair   . History of stress test    a. 2016 MV ([name]): EF 58%, no ischemia.  . Hyperlipidemia   . Hypertension   . Leg pain   . Legally blind   . Necrotic toes ([place]) 02/21/2018  . PAD (peripheral artery disease) ([place])   . PAF (paroxysmal atrial fibrillation) ([name])    a. on Eliquis as of 2018; b. CHADS2VASc => 5 (HTN, DM, stroke x 2, vascular disease)  . Peripheral vascular disease ([place])    a. followed by Dr. Lucky Cowboy; b. s/p kissing balloon stents and right external iliac stent in 10/2017; c. LE angiogrpahy 01/2018: No significant arterial occlusive disease in the  lower extremities.  . Pulmonary nodules   . Stroke [facility])    a. 2016 & 2018  . Stroke [facility]) 07/11/2018    Past Surgical History:  Procedure Laterality Date  . AMPUTATION Right 06/02/2018   Procedure: AMPUTATION BELOW KNEE;  Surgeon: [name], MD;  Location: [facility] ORS;  Service: Vascular;  Laterality: Right;  . APPENDECTOMY    . COLONOSCOPY WITH PROPOFOL N/A 05/12/2018   Procedure: COLONOSCOPY WITH PROPOFOL;  Surgeon: [name], MD;  Location[facility] ENDOSCOPY;  Service: Gastroenterology;  Laterality: N/A;  . ESOPHAGOGASTRODUODENOSCOPY (EGD) WITH PROPOFOL N/A 05/12/2018   Procedure: ESOPHAGOGASTRODUODENOSCOPY (EGD) WITH PROPOFOL;  Surgeon: [name], MD;  Location: [facility] ENDOSCOPY;  Service: Gastroenterology;  Laterality: N/A;  . GIVENS CAPSULE STUDY N/A 07/13/2018   Procedure: GIVENS CAPSULE STUDY;  Surgeon: [name], MD;  Location: [facility] ENDOSCOPY;  Service: Gastroenterology;  Laterality: N/A;  . HERNIA REPAIR     UMBILICAL  . LOWER EXTREMITY ANGIOGRAPHY Right 10/25/2017   Procedure: LOWER EXTREMITY ANGIOGRAPHY;  Surgeon: [name], MD;  Location: [place] CV LAB;  Service: Cardiovascular;  Laterality: Right;  . LOWER EXTREMITY ANGIOGRAPHY Right 01/13/2018   Procedure: LOWER EXTREMITY ANGIOGRAPHY;  Surgeon:  [name], MD;  Location: [place] CV LAB;  Service: Cardiovascular;  Laterality: Right;  . LOWER EXTREMITY INTERVENTION  10/25/2017   Procedure: LOWER EXTREMITY INTERVENTION;  Surgeon: [name], MD;  Location: [place] CV LAB;  Service: Cardiovascular;;  . SKIN SPLIT GRAFT Right 12/28/2018   Procedure: SKIN GRAFT SPLIT THICKNESS ( SYNTHETIC );  Surgeon: [name], MD;  Location: [facility] ORS;  Service: Vascular;  Laterality: Right;  . TONSILLECTOMY    . WOUND DEBRIDEMENT Right 08/08/2018   Procedure: DEBRIDEMENT WOUND WITH WOUND VAC APPLICATION;  Surgeon: [name], MD;  Location: [facility] ORS;  Service: Vascular;  Laterality: Right;      Allergies   Allergen Reactions  . Sodium Pentobarbital [Pentobarbital] Shortness Of Breath  . Lipitor [Atorvastatin] Rash    Current Outpatient Medications  Medication Sig Dispense Refill  . acetaminophen (TYLENOL) 500 MG tablet Take 1,000 mg by mouth every 6 (six) hours as needed (for pain.).    Marland Kitchen apixaban (ELIQUIS) 5 MG TABS tablet Take 1 tablet (5 mg total) by mouth 2 (two) times daily. 180 tablet 1  . aspirin 81 MG EC tablet Take 1 tablet (81 mg total) by mouth daily. 90 tablet 4  . Biotin (BIOTIN 5000) 5 MG CAPS Take 1 capsule by mouth daily.    . Calcium Carb-Cholecalciferol (CALCIUM 600-D PO) Take by mouth daily.    . cholecalciferol (VITAMIN D3) 25 MCG (1000 UT) tablet Take 5,000 Units by mouth daily.     Marland Kitchen docusate sodium (COLACE) 100 MG capsule Take 1 capsule (100 mg total) by mouth daily. Do not take if you have loose stools 90 capsule 2  . donepezil (ARICEPT) 5 MG tablet Take 5 mg by mouth at bedtime.    . empagliflozin (JARDIANCE) 10 MG TABS tablet Take 5 mg by mouth daily. 90 tablet 1  . ezetimibe-simvastatin (VYTORIN) 10-40 MG tablet Take 1 tablet by mouth daily at 6 PM. 90 tablet 3  . ferrous sulfate (FERROUSUL) 325 (65 FE) MG tablet Take 1 tablet (325 mg total) by mouth 2 (two) times daily with a meal. 120 tablet 3  . gabapentin (NEURONTIN) 800 MG tablet Take 1 tablet (800 mg total) by mouth 3 (three) times daily. 270 tablet 1  . hydrochlorothiazide (MICROZIDE) 12.5 MG capsule Take 1 capsule (12.5 mg total) by mouth daily. 45 capsule 1  . HYDROcodone-acetaminophen (NORCO) 5-325 MG tablet Take 1 tablet by mouth every 6 (six) hours as needed for moderate pain. 30 tablet 0  . lansoprazole (PREVACID) 30 MG capsule Take 30 mg by mouth daily at 12 noon.    . metFORMIN (GLUCOPHAGE) 1000 MG tablet Take 1 tablet (1,000 mg total) by mouth 2 (two) times daily with a meal. 180 tablet 3  . metoprolol tartrate (LOPRESSOR) 25 MG tablet Take 1 tablet (25 mg total) by mouth 2 (two) times daily. 180  tablet 3  . Multiple Vitamin (MULTIVITAMIN WITH MINERALS) TABS tablet Take 1 tablet by mouth daily. 30 tablet 0  . ONE TOUCH ULTRA TEST test strip USE TO TEST BLOOD SUGAR BID 100 each 12  . ONETOUCH DELICA LANCETS 03T MISC USE AS DIRECTED TO TEST BLOOD SUGAR BID 100 each 12  . vitamin B-12 (CYANOCOBALAMIN) 1000 MCG tablet Take 1,000 mcg by mouth daily.    . vitamin C (ASCORBIC ACID) 500 MG tablet Take 500 mg by mouth daily.     No current facility-administered medications for this visit.  Physical Exam BP 124/75 (BP Location: Right Arm)   Pulse 87   Resp 16  Gen:  WD/WN, NAD Skin: Wound clean and healing.  There appears to be a thin layer of skin trying to form with excellent granulation tissue underneath.     Assessment/Plan:  S/P bilateral BKA (below knee amputation) ([place]) Status post synthetic skin graft to the large open wound on the base of the amputation.  It actually looks quite good.  Although it is early on, it looks like we are going to have a decent take with a skin graft.  The Adaptic was removed today and we will go with a Xeroform/Vaseline gauze and a Kerlix wrap at this point.  The outer layer can stay on for 3 to 4 days as long as it is not soiled and just have a Xeroform/Vaseline gauze and a Kerlix wrap placed over it for protection.  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  To give him some tramadol today but I think he should see the wound center to discuss further options      Leotis Pain 01/10/2019, 12:07 PM   This note was created with Dragon medical transcription system.  Any errors from dictation are unintentional.

## 2019-01-11 DIAGNOSIS — Z89511 Acquired absence of right leg below knee: Secondary | ICD-10-CM | POA: Diagnosis not present

## 2019-01-12 ENCOUNTER — Telehealth (INDEPENDENT_AMBULATORY_CARE_PROVIDER_SITE_OTHER): Payer: Self-pay

## 2019-01-12 NOTE — Telephone Encounter (Signed)
Patient wife left a message asking should she continue wrapping the wound with gauze,should she clean the wound,or is there any other wound care instructions.I refer to Dr Lucky Cowboy last note and inform her to place xeroform/vasoline gauze with kerlix and change outer layer every 3-4 days if not soiled.The patient wife inform me that they order supplies through [facility] and will be giving them a call to order the xerform/vasoline gauze.

## 2019-01-13 DIAGNOSIS — Z89511 Acquired absence of right leg below knee: Secondary | ICD-10-CM | POA: Diagnosis not present

## 2019-01-16 ENCOUNTER — Telehealth: Payer: Self-pay

## 2019-01-16 MED ORDER — EZETIMIBE-SIMVASTATIN 10-40 MG PO TABS: 1.0000 | ORAL_TABLET | Freq: Every day | ORAL | 2 refills | Status: DC

## 2019-01-16 NOTE — Telephone Encounter (Signed)
Requested Prescriptions   Signed Prescriptions Disp Refills  . ezetimibe-simvastatin (VYTORIN) 10-40 MG tablet 90 tablet 2    Sig: Take 1 tablet by mouth daily at 6 PM.    Authorizing Provider: Minna Merritts    Ordering User: Raelene Bott, Fabricio Endsley L

## 2019-01-17 ENCOUNTER — Ambulatory Visit (INDEPENDENT_AMBULATORY_CARE_PROVIDER_SITE_OTHER): Payer: Medicare HMO | Admitting: Vascular Surgery

## 2019-01-17 ENCOUNTER — Ambulatory Visit: Payer: Medicare HMO | Admitting: Gastroenterology

## 2019-01-20 ENCOUNTER — Telehealth (INDEPENDENT_AMBULATORY_CARE_PROVIDER_SITE_OTHER): Payer: Self-pay | Admitting: Nurse Practitioner

## 2019-01-20 DIAGNOSIS — Z89511 Acquired absence of right leg below knee: Secondary | ICD-10-CM | POA: Diagnosis not present

## 2019-01-20 NOTE — Telephone Encounter (Signed)
Patient was seen on 01/10/19 and given Tramadol.  Patient has called today and requested more Tramadol to be sent in.  Patient states he is in constant terrible pain. Please advise, AS, CMA

## 2019-01-23 ENCOUNTER — Other Ambulatory Visit (INDEPENDENT_AMBULATORY_CARE_PROVIDER_SITE_OTHER): Payer: Self-pay | Admitting: Nurse Practitioner

## 2019-01-23 ENCOUNTER — Encounter: Payer: Self-pay | Admitting: Gastroenterology

## 2019-01-23 ENCOUNTER — Other Ambulatory Visit: Payer: Self-pay

## 2019-01-23 ENCOUNTER — Ambulatory Visit: Payer: Medicare HMO | Admitting: Gastroenterology

## 2019-01-23 VITALS — BP 101/69 | HR 119 | Temp 98.4°F | Ht 68.0 in

## 2019-01-23 DIAGNOSIS — T8789 Other complications of amputation stump: Secondary | ICD-10-CM

## 2019-01-23 DIAGNOSIS — D509 Iron deficiency anemia, unspecified: Secondary | ICD-10-CM | POA: Diagnosis not present

## 2019-01-23 DIAGNOSIS — M79609 Pain in unspecified limb: Secondary | ICD-10-CM

## 2019-01-23 MED ORDER — TRAMADOL HCL 50 MG PO TABS: 50.0000 mg | ORAL_TABLET | Freq: Two times a day (BID) | ORAL | 0 refills | Status: DC | PRN

## 2019-01-23 NOTE — Progress Notes (Signed)
[name] MD, MRCP(U.K) [address]  [place]  [place], [place] [address]  Main: [phone]  Fax: [phone]   Primary Care Physician: [name], DO  Primary Gastroenterologist:  Dr. [name]   Iron deficiency anemia  HPI: [name]. is a 63 y.o. male   Summary of history :  Initially referred an seen in 04/2010 for iron deficiency anemia. Noted to have microcytic anemia. Normal 7 months prior. Iron studies performed on 04/07/2018 demonstrates a ferritin of 13. Fecal occult blood testing  positive. He is on Eliquis. Denied any overt blood loss.  04/13/18: no blood in urine,noirmal b12 and folate. Celiac serology negative.   05/12/18: EGD: gastritis on biopsy, colonoscopy 5 small polyps resected-tubular adenomas.  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   Interval history12/16/19 -01/23/2019  CBC Latest Ref Rng & Units 12/27/2018 12/23/2018 12/12/2018  WBC 3.4 - 10.8 x10E3/uL 7.8 7.9 7.6  Hemoglobin 13.0 - 17.7 g/dL 13.1 13.4 13.1  Hematocrit 37.5 - 51.0 % 39.3 42.4 40.5  Platelets 150 - 450 x10E3/uL 257 264 210    Stools are brown in color , taking iron , doing well , no issues. On eloquis.    Receiving IV iron by Dr Tasia Catchings      Current Outpatient Medications  Medication Sig Dispense Refill  . acetaminophen (TYLENOL) 500 MG tablet Take 1,000 mg by mouth every 6 (six) hours as needed (for pain.).    Marland Kitchen apixaban (ELIQUIS) 5 MG TABS tablet Take 1 tablet (5 mg total) by mouth 2 (two) times daily. 180 tablet 1  . aspirin 81 MG EC tablet Take 1 tablet (81 mg total) by mouth daily. 90 tablet 4  . Biotin (BIOTIN 5000) 5 MG CAPS Take 1 capsule by mouth daily.    . Calcium Carb-Cholecalciferol (CALCIUM 600-D PO) Take by mouth daily.    . cholecalciferol (VITAMIN D3) 25 MCG (1000 UT) tablet Take 5,000 Units by mouth daily.     Marland Kitchen docusate sodium  (COLACE) 100 MG capsule Take 1 capsule (100 mg total) by mouth daily. Do not take if you have loose stools 90 capsule 2  . donepezil (ARICEPT) 5 MG tablet Take 5 mg by mouth at bedtime.    . empagliflozin (JARDIANCE) 10 MG TABS tablet Take 5 mg by mouth daily. 90 tablet 1  . ezetimibe-simvastatin (VYTORIN) 10-40 MG tablet Take 1 tablet by mouth daily at 6 PM. 90 tablet 2  . ferrous sulfate (FERROUSUL) 325 (65 FE) MG tablet Take 1 tablet (325 mg total) by mouth 2 (two) times daily with a meal. 120 tablet 3  . gabapentin (NEURONTIN) 800 MG tablet Take 1 tablet (800 mg total) by mouth 3 (three) times daily. 270 tablet 1  . hydrochlorothiazide (MICROZIDE) 12.5 MG capsule Take 1 capsule (12.5 mg total) by mouth daily. 45 capsule 1  . HYDROcodone-acetaminophen (NORCO) 5-325 MG tablet Take 1 tablet by mouth every 6 (six) hours as needed for moderate pain. 30 tablet 0  . lansoprazole (PREVACID) 30 MG capsule Take 30 mg by mouth daily at 12 noon.    . metFORMIN (GLUCOPHAGE) 1000 MG tablet Take 1 tablet (1,000 mg total) by mouth 2 (two) times daily with a meal. 180 tablet 3  . metoprolol tartrate (LOPRESSOR) 25 MG tablet Take 1 tablet (25 mg total) by mouth 2 (two) times daily. 180 tablet 3  .  Multiple Vitamin (MULTIVITAMIN WITH MINERALS) TABS tablet Take 1 tablet by mouth daily. 30 tablet 0  . ONE TOUCH ULTRA TEST test strip USE TO TEST BLOOD SUGAR BID 100 each 12  . ONETOUCH DELICA LANCETS 42V MISC USE AS DIRECTED TO TEST BLOOD SUGAR BID 100 each 12  . traMADol (ULTRAM) 50 MG tablet Take 1 tablet (50 mg total) by mouth every 12 (twelve) hours as needed. 30 tablet 0  . vitamin B-12 (CYANOCOBALAMIN) 1000 MCG tablet Take 1,000 mcg by mouth daily.    . vitamin C (ASCORBIC ACID) 500 MG tablet Take 500 mg by mouth daily.     No current facility-administered medications for this visit.     Allergies as of 01/23/2019 - Review Complete 01/10/2019  Allergen Reaction Noted  . Sodium pentobarbital [pentobarbital]  Shortness Of Breath 01/13/2018  . Lipitor [atorvastatin] Rash 03/15/2015    ROS:  General: Negative for anorexia, weight loss, fever, chills, fatigue, weakness. ENT: Negative for hoarseness, difficulty swallowing , nasal congestion. CV: Negative for chest pain, angina, palpitations, dyspnea on exertion, peripheral edema.  Respiratory: Negative for dyspnea at rest, dyspnea on exertion, cough, sputum, wheezing.  GI: See history of present illness. GU:  Negative for dysuria, hematuria, urinary incontinence, urinary frequency, nocturnal urination.  Endo: Negative for unusual weight change.    Physical Examination:   There were no vitals taken for this visit.  General: Well-nourished, well-developed in no acute distress.  Eyes: No icterus. Conjunctivae pink. Mouth: Oropharyngeal mucosa moist and pink , no lesions erythema or exudate. Lungs: Clear to auscultation bilaterally. Non-labored. Heart: Regular rate and rhythm, no murmurs rubs or gallops.  Abdomen: Bowel sounds are normal, nontender, nondistended, no hepatosplenomegaly or masses, no abdominal bruits or hernia , no rebound or guarding.   Extremities: No lower extremity edema. No clubbing or deformities. Neuro: Alert and oriented x 3.  Grossly intact. Skin: Warm and dry, no jaundice.   Psych: Alert and cooperative, normal mood and affect.   Imaging Studies: No results found.  Assessment and Plan:   [name]. is a 63 y.o. y/o male  here to followup for iron deficiency anemia. No overt blood loss. EGD+colonoscopy showed no abnormality to explain the anemia . On IV iron. 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 Hb is normal at present.   Plan  1. Discussed options of enteroscopy and cautery  of the AVM- he prefers to watch and see- if Hb drops or if stool turns black would like to pursue enteroscopy at that time. He will follow up with  Dr Wynetta Emery for periodic CBC checks and will return to me asap if there are signs of bleeding   2. Suggest to stay on Pepcid long term as he is on anticoagulation    Dr [name]  MD,MRCP [facility]) Follow up PRN

## 2019-01-23 NOTE — Telephone Encounter (Signed)
I sent in a one time order

## 2019-01-23 NOTE — Telephone Encounter (Signed)
Left message to let patient know med was sent to pharmacy. AS, CMA

## 2019-01-25 ENCOUNTER — Ambulatory Visit: Payer: Medicare HMO | Admitting: Gastroenterology

## 2019-01-27 ENCOUNTER — Other Ambulatory Visit: Payer: Self-pay

## 2019-01-27 ENCOUNTER — Ambulatory Visit (INDEPENDENT_AMBULATORY_CARE_PROVIDER_SITE_OTHER): Payer: Medicare HMO | Admitting: Nurse Practitioner

## 2019-01-27 ENCOUNTER — Encounter (INDEPENDENT_AMBULATORY_CARE_PROVIDER_SITE_OTHER): Payer: Self-pay | Admitting: Nurse Practitioner

## 2019-01-27 VITALS — BP 114/67 | HR 92 | Resp 16

## 2019-01-27 DIAGNOSIS — E1151 Type 2 diabetes mellitus with diabetic peripheral angiopathy without gangrene: Secondary | ICD-10-CM

## 2019-01-27 DIAGNOSIS — Z79899 Other long term (current) drug therapy: Secondary | ICD-10-CM

## 2019-01-27 DIAGNOSIS — Z89511 Acquired absence of right leg below knee: Secondary | ICD-10-CM

## 2019-01-27 DIAGNOSIS — Z89512 Acquired absence of left leg below knee: Secondary | ICD-10-CM

## 2019-01-27 DIAGNOSIS — G894 Chronic pain syndrome: Secondary | ICD-10-CM | POA: Diagnosis not present

## 2019-01-27 DIAGNOSIS — E782 Mixed hyperlipidemia: Secondary | ICD-10-CM

## 2019-01-27 DIAGNOSIS — I70209 Unspecified atherosclerosis of native arteries of extremities, unspecified extremity: Secondary | ICD-10-CM

## 2019-01-27 NOTE — Progress Notes (Signed)
SUBJECTIVE:  Patient ID: [name]., male    DOB: 1956/04/30, 63 y.o.   MRN: [id number] Chief Complaint  Patient presents with  . Follow-up    3week follow up    HPI  [name] is a 63 y.o. male that presents today with evaluation of his right below-knee amputation stump.  The patient recently underwent skin graft on 12/28/2018 to the stump due to delayed wound healing and closure.  The patient has had pain with this surgery and continues to have pain with wound healing.  Today, the rest of his staples were removed and the patient tolerated it very well.  The stump is covered with epithelialization and showing signs of healing.  There is some serosanguineous drainage from the wound.  No eschar present.  No foul-smelling drainage present.  Patient denies any fever, chills, nausea, vomiting or diarrhea.  He denies any chest pain or shortness of breath.  His pain has been controlled with tramadol.  Past Medical History:  Diagnosis Date  . Acute left PCA stroke ([place]) 02/17/2015  . Anemia   . Atherosclerosis of native arteries of the extremities with gangrene ([place]) 05/08/2018  . Atrial fibrillation ([place])   . Blind    right eye  . Diabetes mellitus with complication ([place])   . Dilated aortic root ([place])    a. 04/2018 Echo: 4.1cm. Asc Ao 3.5cm.  Marland Kitchen Dysrhythmia    a fib  . Gangrene of right foot ([place]) 06/02/2018  . GERD (gastroesophageal reflux disease)   . History of echocardiogram    a. 04/2018 Echo: Ef 60-65%, no rwma, midly to mod dil Ao root - 4.1cm. Asc Ao 3.5cm. Mild MR. Nl RV fxn. Nl PASP.  Marland Kitchen History of hernia repair   . History of stress test    a. 2016 MV ([name]): EF 58%, no ischemia.  . Hyperlipidemia   . Hypertension   . Leg pain   . Legally blind   . Necrotic toes ([place]) 02/21/2018  . PAD (peripheral artery disease) ([place])   . PAF (paroxysmal atrial fibrillation) ([name])    a. on Eliquis as of 2018; b. CHADS2VASc => 5 (HTN, DM, stroke x 2, vascular disease)  .  Peripheral vascular disease ([place])    a. followed by Dr. Lucky Cowboy; b. s/p kissing balloon stents and right external iliac stent in 10/2017; c. LE angiogrpahy 01/2018: No significant arterial occlusive disease in the lower extremities.  . Pulmonary nodules   . Stroke [facility])    a. 2016 & 2018  . Stroke [facility]) 07/11/2018    Past Surgical History:  Procedure Laterality Date  . AMPUTATION Right 06/02/2018   Procedure: AMPUTATION BELOW KNEE;  Surgeon: [name], MD;  Location: [facility] ORS;  Service: Vascular;  Laterality: Right;  . APPENDECTOMY    . COLONOSCOPY WITH PROPOFOL N/A 05/12/2018   Procedure: COLONOSCOPY WITH PROPOFOL;  Surgeon: [name], MD;  [facility] ENDOSCOPY;  Service: Gastroenterology;  Laterality: N/A;  . ESOPHAGOGASTRODUODENOSCOPY (EGD) WITH PROPOFOL N/A 05/12/2018   Procedure: ESOPHAGOGASTRODUODENOSCOPY (EGD) WITH PROPOFOL;  Surgeon: [name], MD;  Location: [facility] ENDOSCOPY;  Service: Gastroenterology;  Laterality: N/A;  . GIVENS CAPSULE STUDY N/A 07/13/2018   Procedure: GIVENS CAPSULE STUDY;  Surgeon: [name], MD;  Location: [facility] ENDOSCOPY;  Service: Gastroenterology;  Laterality: N/A;  . HERNIA REPAIR     UMBILICAL  . LOWER EXTREMITY ANGIOGRAPHY Right 10/25/2017   Procedure: LOWER EXTREMITY ANGIOGRAPHY;  Surgeon: [name], MD;  Location: [facility]  INVASIVE CV LAB;  Service: Cardiovascular;  Laterality: Right;  . LOWER EXTREMITY ANGIOGRAPHY Right 01/13/2018   Procedure: LOWER EXTREMITY ANGIOGRAPHY;  Surgeon: [name], MD;  Location: [place] CV LAB;  Service: Cardiovascular;  Laterality: Right;  . LOWER EXTREMITY INTERVENTION  10/25/2017   Procedure: LOWER EXTREMITY INTERVENTION;  Surgeon: [name], MD;  Location: [place] CV LAB;  Service: Cardiovascular;;  . SKIN SPLIT GRAFT Right 12/28/2018   Procedure: SKIN GRAFT SPLIT THICKNESS ( SYNTHETIC );  Surgeon: [name], MD;  Location: [facility] ORS;  Service: Vascular;  Laterality: Right;  . TONSILLECTOMY    .  WOUND DEBRIDEMENT Right 08/08/2018   Procedure: DEBRIDEMENT WOUND WITH WOUND VAC APPLICATION;  Surgeon: [name], MD;  Location: [facility] ORS;  Service: Vascular;  Laterality: Right;    Social History   Socioeconomic History  . Marital status: Married    Spouse name: Not on file  . Number of children: 0  . Years of education: Not on file  . Highest education level: Associate degree: academic program  Occupational History  . Not on file  Social Needs  . Financial resource strain: Somewhat hard  . Food insecurity    Worry: Never true    Inability: Never true  . Transportation needs    Medical: No    Non-medical: No  Tobacco Use  . Smoking status: Former Smoker    Packs/day: 1.00    Years: 30.00    Pack years: 30.00    Types: Cigarettes    Quit date: 01/09/2018    Years since quitting: 1.0  . Smokeless tobacco: Never Used  Substance and Sexual Activity  . Alcohol use: Never    Alcohol/week: 0.0 standard drinks    Frequency: Never    Comment: occasionally beer   . Drug use: No  . Sexual activity: Yes  Lifestyle  . Physical activity    Days per week: 0 days    Minutes per session: 0 min  . Stress: Not at all  Relationships  . Social Herbalist on phone: More than three times a week    Gets together: Never    Attends religious service: Never    Active member of club or organization: No    Attends meetings of clubs or organizations: Never    Relationship status: Married  . Intimate partner violence    Fear of current or ex partner: No    Emotionally abused: No    Physically abused: No    Forced sexual activity: No  Other Topics Concern  . Not on file  Social History Narrative  . Not on file    Family History  Problem Relation Age of Onset  . Diabetes Father   . Hypertension Father   . Hyperlipidemia Father     Allergies  Allergen Reactions  . Sodium Pentobarbital [Pentobarbital] Shortness Of Breath  . Lipitor [Atorvastatin] Rash     Review of  Systems   Review of Systems: Negative Unless Checked Constitutional: [] Weight loss  [] Fever  [] Chills Cardiac: [] Chest pain   [x]  Atrial Fibrillation  [] Palpitations   [] Shortness of breath when laying flat   [] Shortness of breath with exertion. [] Shortness of breath at rest Vascular:  [] Pain in legs with walking   [] Pain in legs with standing [] Pain in legs when laying flat   [] Claudication    [] Pain in feet when laying flat    [] History of DVT   [] Phlebitis   [] Swelling in  legs   [] Varicose veins   [] Non-healing ulcers Pulmonary:   [] Uses home oxygen   [] Productive cough   [] Hemoptysis   [] Wheeze  [] COPD   [] Asthma Neurologic:  [] Dizziness   [] Seizures  [] Blackouts [] History of stroke   [] History of TIA  [] Aphasia   [] Temporary Blindness   [] Weakness or numbness in arm   [x] Weakness or numbness in leg Musculoskeletal:   [] Joint swelling   [] Joint pain   [] Low back pain  []  History of Knee Replacement [] Arthritis [] back Surgeries  []  Spinal Stenosis    Hematologic:  [] Easy bruising  [] Easy bleeding   [] Hypercoagulable state   [] Anemic Gastrointestinal:  [] Diarrhea   [] Vomiting  [] Gastroesophageal reflux/heartburn   [] Difficulty swallowing. [] Abdominal pain Genitourinary:  [] Chronic kidney disease   [] Difficult urination  [] Anuric   [] Blood in urine [] Frequent urination  [] Burning with urination   [] Hematuria Skin:  [] Rashes   [] Ulcers [x] Wounds Psychological:  [] History of anxiety   []  History of major depression  [x]  Memory Difficulties      OBJECTIVE:   Physical Exam  BP 114/67 (BP Location: Right Arm)   Pulse 92   Resp 16   Gen: WD/WN, NAD Head: [place]/AT, No temporalis wasting.  Ear/Nose/Throat: Hearing grossly intact, nares w/o erythema or drainage Eyes: PER, EOMI, sclera nonicteric.  Neck: Supple, no masses.  No JVD.  Pulmonary:  Good air movement, no use of accessory muscles.  Cardiac: RRR Vascular:  Wound approximately 100 cm in size, covered with granulation tissue showing signs  of wound healing.  No foul-smelling drainage.  No eschar. Vessel Right Left  Radial Palpable Palpable   Gastrointestinal: soft, non-distended. No guarding/no peritoneal signs.  Musculoskeletal: M/S 5/5 throughout.  No deformity or atrophy.  Neurologic: Pain and light touch intact in extremities.  Symmetrical.  Speech is fluent. Motor exam as listed above. Psychiatric: Judgment intact, Mood & affect appropriate for pt's clinical situation. Dermatologic:   No changes consistent with cellulitis. Lymph : No Cervical lymphadenopathy, no lichenification or skin changes of chronic lymphedema.       ASSESSMENT AND PLAN:  1. S/P bilateral BKA (below knee amputation) ([place]) His wound is continuing to heal at this time.  It is showing nice progression.  We will also consult with wound clinic to ensure continued wound healing and to try to prevent a regression from happening.  Patient should continue daily wound dressings at home.  We will have the patient follow-up in 6 weeks in office. - Ambulatory referral to [facility]  2. Chronic pain syndrome Patient was previously referred to the pain clinic where he was prior to his amputation.  Patient's pain response is exaggerated in relation to the wound.  We will allow pain management for further control of his pain.  3. 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  Hgb A1C to be monitored as already arranged by primary service   4. Mixed hyperlipidemia Continue statin as ordered and reviewed, no changes at this time    Current Outpatient Medications on File Prior to Visit  Medication Sig Dispense Refill  . acetaminophen (TYLENOL) 500 MG tablet Take 1,000 mg by mouth every 6 (six) hours as needed (for pain.).    Marland Kitchen apixaban (ELIQUIS) 5 MG TABS tablet Take 1 tablet (5 mg total) by mouth 2 (two)  times daily. 180 tablet 1  . aspirin 81 MG EC  tablet Take 1 tablet (81 mg total) by mouth daily. 90 tablet 4  . Biotin (BIOTIN 5000) 5 MG CAPS Take 1 capsule by mouth daily.    . Calcium Carb-Cholecalciferol (CALCIUM 600-D PO) Take by mouth daily.    . cholecalciferol (VITAMIN D3) 25 MCG (1000 UT) tablet Take 5,000 Units by mouth daily.     Marland Kitchen docusate sodium (COLACE) 100 MG capsule Take 1 capsule (100 mg total) by mouth daily. Do not take if you have loose stools 90 capsule 2  . donepezil (ARICEPT) 5 MG tablet Take 5 mg by mouth at bedtime.    . empagliflozin (JARDIANCE) 10 MG TABS tablet Take 5 mg by mouth daily. 90 tablet 1  . ezetimibe-simvastatin (VYTORIN) 10-40 MG tablet Take 1 tablet by mouth daily at 6 PM. 90 tablet 2  . ferrous sulfate (FERROUSUL) 325 (65 FE) MG tablet Take 1 tablet (325 mg total) by mouth 2 (two) times daily with a meal. 120 tablet 3  . gabapentin (NEURONTIN) 800 MG tablet Take 1 tablet (800 mg total) by mouth 3 (three) times daily. 270 tablet 1  . hydrochlorothiazide (MICROZIDE) 12.5 MG capsule Take 1 capsule (12.5 mg total) by mouth daily. 45 capsule 1  . HYDROcodone-acetaminophen (NORCO) 5-325 MG tablet Take 1 tablet by mouth every 6 (six) hours as needed for moderate pain. 30 tablet 0  . lansoprazole (PREVACID) 30 MG capsule Take 30 mg by mouth daily at 12 noon.    . metFORMIN (GLUCOPHAGE) 1000 MG tablet Take 1 tablet (1,000 mg total) by mouth 2 (two) times daily with a meal. 180 tablet 3  . metoprolol tartrate (LOPRESSOR) 25 MG tablet Take 1 tablet (25 mg total) by mouth 2 (two) times daily. 180 tablet 3  . Multiple Vitamin (MULTIVITAMIN WITH MINERALS) TABS tablet Take 1 tablet by mouth daily. 30 tablet 0  . ONE TOUCH ULTRA TEST test strip USE TO TEST BLOOD SUGAR BID 100 each 12  . ONETOUCH DELICA LANCETS 93G MISC USE AS DIRECTED TO TEST BLOOD SUGAR BID 100 each 12  . traMADol (ULTRAM) 50 MG tablet Take 1 tablet (50 mg total) by mouth every 12 (twelve) hours as  needed. 30 tablet 0  . vitamin B-12 (CYANOCOBALAMIN) 1000 MCG tablet Take 1,000 mcg by mouth daily.    . vitamin C (ASCORBIC ACID) 500 MG tablet Take 500 mg by mouth daily.     No current facility-administered medications on file prior to visit.     There are no Patient Instructions on file for this visit. No follow-ups on file.   Kris Hartmann, NP  This note was completed with Sales executive.  Any errors are purely unintentional.

## 2019-01-30 ENCOUNTER — Telehealth (INDEPENDENT_AMBULATORY_CARE_PROVIDER_SITE_OTHER): Payer: Self-pay

## 2019-01-30 NOTE — Telephone Encounter (Signed)
Patient wife has been informed with Eulogio Ditch NP medical recommendations and she informed me that the patient has an appointment with wound care on Wednesday

## 2019-01-30 NOTE — Telephone Encounter (Signed)
Let's change rinse and clean wound daily.  She should also change xeroform with gauze and see if this helps with wound healing.

## 2019-01-31 ENCOUNTER — Ambulatory Visit (INDEPENDENT_AMBULATORY_CARE_PROVIDER_SITE_OTHER): Payer: Medicare HMO | Admitting: Nurse Practitioner

## 2019-01-31 DIAGNOSIS — Z89511 Acquired absence of right leg below knee: Secondary | ICD-10-CM | POA: Diagnosis not present

## 2019-02-01 ENCOUNTER — Encounter: Payer: Medicare HMO | Attending: Internal Medicine | Admitting: Internal Medicine

## 2019-02-01 ENCOUNTER — Other Ambulatory Visit: Payer: Self-pay

## 2019-02-01 DIAGNOSIS — I480 Paroxysmal atrial fibrillation: Secondary | ICD-10-CM | POA: Insufficient documentation

## 2019-02-01 DIAGNOSIS — E11621 Type 2 diabetes mellitus with foot ulcer: Secondary | ICD-10-CM | POA: Insufficient documentation

## 2019-02-01 DIAGNOSIS — I739 Peripheral vascular disease, unspecified: Secondary | ICD-10-CM | POA: Diagnosis not present

## 2019-02-01 DIAGNOSIS — D509 Iron deficiency anemia, unspecified: Secondary | ICD-10-CM | POA: Diagnosis not present

## 2019-02-01 DIAGNOSIS — Z89511 Acquired absence of right leg below knee: Secondary | ICD-10-CM | POA: Insufficient documentation

## 2019-02-01 DIAGNOSIS — T8789 Other complications of amputation stump: Secondary | ICD-10-CM | POA: Diagnosis not present

## 2019-02-01 DIAGNOSIS — E1151 Type 2 diabetes mellitus with diabetic peripheral angiopathy without gangrene: Secondary | ICD-10-CM | POA: Diagnosis not present

## 2019-02-01 DIAGNOSIS — L97811 Non-pressure chronic ulcer of other part of right lower leg limited to breakdown of skin: Secondary | ICD-10-CM | POA: Diagnosis present

## 2019-02-01 DIAGNOSIS — L97812 Non-pressure chronic ulcer of other part of right lower leg with fat layer exposed: Secondary | ICD-10-CM | POA: Diagnosis not present

## 2019-02-01 DIAGNOSIS — Z7901 Long term (current) use of anticoagulants: Secondary | ICD-10-CM | POA: Diagnosis not present

## 2019-02-02 DIAGNOSIS — Z89511 Acquired absence of right leg below knee: Secondary | ICD-10-CM | POA: Diagnosis not present

## 2019-02-02 NOTE — Progress Notes (Signed)
[name] ([id number]) Visit Report for 02/01/2019 Abuse/Suicide Risk Screen Details Patient Name: [name], [name]. Date of Service: 02/01/2019 2:15 PM Medical Record Number: [id number] Patient Account Number: [id number] Date of Birth/Sex: 1956/06/18 (63 y.o. M) Treating RN: Montey Hora Primary Care Saoirse Legere: Park Liter Other Clinician: Referring Tylee Newby: Eulogio Ditch Treating Alaya Iverson/Extender: Tito Dine in Treatment: 0 Abuse/Suicide Risk Screen Items Answer ABUSE RISK SCREEN: Has anyone close to you tried to hurt or harm you recentlyo No Do you feel uncomfortable with anyone in your familyo No Has anyone forced you do things that you didnot want to doo No Electronic Signature(s) Signed: 02/01/2019 5:19:05 PM By: Montey Hora Entered By: Montey Hora on 02/01/2019 14:13:30 Deats, Louie [place] ([id number]) -------------------------------------------------------------------------------- Activities of Daily Living Details Patient Name: Glenn Standard B. Date of Service: 02/01/2019 2:15 PM Medical Record Number: [id number] Patient Account Number: [id number] Date of Birth/Sex: 01/28/1956 (63 y.o. M) Treating RN: Montey Hora Primary Care Pooja Camuso: Park Liter Other Clinician: Referring Daymian Lill: Eulogio Ditch Treating Giancarlo Askren/Extender: Tito Dine in Treatment: 0 Activities of Daily Living Items Answer Activities of Daily Living (Please select one for each item) Drive Automobile Not Able Take Medications Completely Able Use Telephone Completely Able Care for Appearance Completely Able Use Toilet Completely Able Bath / Shower Completely Able Dress Self Completely Able Feed Self Completely Able Walk Completely Able Get In / Out Bed Completely Able Housework Completely Able Prepare Meals Completely Able Handle Money Completely Able Shop for Self Completely Able Electronic Signature(s) Signed: 02/01/2019 5:19:05 PM By: Montey Hora Entered By: Montey Hora on 02/01/2019 14:13:50 Hirano, Louie [place] ([id number]) -------------------------------------------------------------------------------- Education Screening Details Patient Name: Glenn Standard B. Date of Service: 02/01/2019 2:15 PM Medical Record Number: [id number] Patient Account Number: [id number] Date of Birth/Sex: 1956-01-14 (63 y.o. M) Treating RN: Montey Hora Primary Care Ariz Terrones: Park Liter Other Clinician: Referring Neelah Mannings: Eulogio Ditch Treating Deeana [place]/Extender: Tito Dine in Treatment: 0 Primary Learner Assessed: Patient Learning Preferences/Education Level/Primary Language Learning Preference: Explanation, Demonstration Highest Education Level: College or Above Preferred Language: English Cognitive Barrier Language Barrier: No Translator Needed: No Memory Deficit: No Emotional Barrier: No Cultural/Religious Beliefs Affecting Medical Care: No Physical Barrier Impaired Vision: Yes Legally Blind, right eye Impaired Hearing: No Decreased Hand dexterity: No Knowledge/Comprehension Knowledge Level: Medium Comprehension Level: Medium Ability to understand written Medium instructions: Ability to understand verbal Medium instructions: Motivation Anxiety Level: Calm Cooperation: Cooperative Education Importance: Acknowledges Need Interest in Health Problems: Asks Questions Perception: Coherent Willingness to Engage in Self- Medium Management Activities: Readiness to Engage in Self- Medium Management Activities: Electronic Signature(s) Signed: 02/01/2019 5:19:05 PM By: Montey Hora Entered By: Montey Hora on 02/01/2019 14:14:28 Pringle, Louie [place] ([id number]) -------------------------------------------------------------------------------- Fall Risk Assessment Details Patient Name: Glenn Ehrlich. Date of Service: 02/01/2019 2:15 PM Medical Record Number: [id number] Patient Account Number: [id number] Date  of Birth/Sex: [birth year] (63 y.o. M) Treating RN: Montey Hora Primary Care Nicole Defino: Park Liter Other Clinician: Referring Greig Altergott: Eulogio Ditch Treating Lonnell Chaput/Extender: Tito Dine in Treatment: 0 Fall Risk Assessment Items Have you had 2 or more falls in the last 12 monthso 0 No Have you had any fall that resulted in injury in the last 12 monthso 0 No FALLS RISK SCREEN History of falling - immediate or within 3 months 0 No Secondary diagnosis (Do you have 2 or more medical diagnoseso) 0 No Ambulatory aid None/bed rest/wheelchair/nurse 0 Yes Crutches/cane/walker 0 No Furniture 0 No Intravenous therapy Access/Saline/Heparin Lock 0 No Gait/Transferring Normal/ bed  rest/ wheelchair 0 No Weak (short steps with or without shuffle, stooped but able to lift head while 0 No walking, may seek support from furniture) Impaired (short steps with shuffle, may have difficulty arising from chair, head 20 Yes down, impaired balance) Mental Status Oriented to own ability 0 Yes Electronic Signature(s) Signed: 02/01/2019 5:19:05 PM By: Montey Hora Entered By: Montey Hora on 02/01/2019 14:14:48 Verstraete, Louie [place] ([id number]) -------------------------------------------------------------------------------- Foot Assessment Details Patient Name: Glenn Standard B. Date of Service: 02/01/2019 2:15 PM Medical Record Number: [id number] Patient Account Number: [id number] Date of Birth/Sex: 1955/09/06 (63 y.o. M) Treating RN: Montey Hora Primary Care Kimiya Brunelle: Park Liter Other Clinician: Referring Maccoy Haubner: Eulogio Ditch Treating Adolph Clutter/Extender: Tito Dine in Treatment: 0 Foot Assessment Items Site Locations + = Sensation present, - = Sensation absent, C = Callus, U = Ulcer R = Redness, W = Warmth, M = Maceration, PU = Pre-ulcerative lesion F = Fissure, S = Swelling, D = Dryness Assessment Right: Left: Other Deformity: No No Prior Foot Ulcer: No  No Prior Amputation: No No Charcot Joint: No No Ambulatory Status: Non-ambulatory Assistance Device: Wheelchair Gait: Steady Electronic Signature(s) Signed: 02/01/2019 5:19:05 PM By: Montey Hora Entered By: Montey Hora on 02/01/2019 14:15:09 Saunders, Louie [place] ([id number]) -------------------------------------------------------------------------------- Nutrition Risk Screening Details Patient Name: Glenn Standard B. Date of Service: 02/01/2019 2:15 PM Medical Record Number: [id number] Patient Account Number: [id number] Date of Birth/Sex: Aug 02, 1956 (63 y.o. M) Treating RN: Montey Hora Primary Care Victoria Euceda: Park Liter Other Clinician: Referring Ersel Wadleigh: Eulogio Ditch Treating Georgana Romain/Extender: Tito Dine in Treatment: 0 Height (in): 70 Weight (lbs): 175 Body Mass Index (BMI): 25.1 Nutrition Risk Screening Items Score Screening NUTRITION RISK SCREEN: I have an illness or condition that made me change the kind and/or amount of 0 No food I eat I eat fewer than two meals per day 0 No I eat few fruits and vegetables, or milk products 0 No I have three or more drinks of beer, liquor or wine almost every day 0 No I have tooth or mouth problems that make it hard for me to eat 0 No I don't always have enough money to buy the food I need 0 No I eat alone most of the time 0 No I take three or more different prescribed or over-the-counter drugs a day 1 Yes Without wanting to, I have lost or gained 10 pounds in the last six months 0 No I am not always physically able to shop, cook and/or feed myself 0 No Nutrition Protocols Good Risk Protocol 0 No interventions needed Moderate Risk Protocol High Risk Proctocol Risk Level: Good Risk Score: 1 Electronic Signature(s) Signed: 02/01/2019 5:19:05 PM By: Montey Hora Entered By: Montey Hora on 02/01/2019 14:14:57

## 2019-02-03 DIAGNOSIS — R69 Illness, unspecified: Secondary | ICD-10-CM | POA: Diagnosis not present

## 2019-02-07 DIAGNOSIS — I96 Gangrene, not elsewhere classified: Secondary | ICD-10-CM | POA: Diagnosis not present

## 2019-02-07 DIAGNOSIS — Z89511 Acquired absence of right leg below knee: Secondary | ICD-10-CM | POA: Diagnosis not present

## 2019-02-07 NOTE — Progress Notes (Signed)
ESAW, KNIPPEL ([id number]) Visit Report for 02/01/2019 Allergy List Details Patient Name: [name], [name]. Date of Service: 02/01/2019 2:15 PM Medical Record Number: [id number] Patient Account Number: [id number] Date of Birth/Sex: Jul 17, 1956 (63 y.o. M) Treating RN: Montey Hora Primary Care Haidee Stogsdill: Park Liter Other Clinician: Referring Daune Divirgilio: Eulogio Ditch Treating Markon Jares/Extender: Ricard Dillon Weeks in Treatment: 0 Allergies Active Allergies atorvastatin Reaction: rash gabapentin Reaction: dizziness and vomiting pentobarbital Allergy Notes Electronic Signature(s) Signed: 02/01/2019 2:34:41 PM By: Montey Hora Entered By: Montey Hora on 02/01/2019 14:34:41 [name], [name] ([id number]) -------------------------------------------------------------------------------- Arrival Information Details Patient Name: Glenn Standard B. Date of Service: 02/01/2019 2:15 PM Medical Record Number: [id number] Patient Account Number: [id number] Date of Birth/Sex: 04-26-1956 (63 y.o. M) Treating RN: Montey Hora Primary Care Tinley Rought: Park Liter Other Clinician: Referring Kaedyn Belardo: Eulogio Ditch Treating Tynia Wiers/Extender: Tito Dine in Treatment: 0 Visit Information Patient Arrived: Wheel Chair Arrival Time: 14:09 Accompanied By: self Transfer Assistance: None Patient Identification Verified: Yes Secondary Verification Process Yes Completed: Patient Has Alerts: Yes Patient Alerts: Patient on Blood Thinner DMII Eliquis History Since Last Visit Added or deleted any medications: No Any new allergies or adverse reactions: No Had a fall or experienced change in activities of daily living that may affect risk of falls: No Signs or symptoms of abuse/neglect since last visito No Hospitalized since last visit: No Implantable device outside of the clinic excluding cellular tissue based products placed in the center since last visit: No Has Dressing in  Place as Prescribed: Yes Electronic Signature(s) Signed: 02/01/2019 5:19:05 PM By: Montey Hora Entered By: Montey Hora on 02/01/2019 14:10:26 [name], [name] ([id number]) -------------------------------------------------------------------------------- Clinic Level of Care Assessment Details Patient Name: Glenn Standard B. Date of Service: 02/01/2019 2:15 PM Medical Record Number: [id number] Patient Account Number: [id number] Date of Birth/Sex: [birth year] (63 y.o. M) Treating RN: Cornell Barman Primary Care Forest Pruden: Park Liter Other Clinician: Referring Kierre Hintz: Eulogio Ditch Treating Ty Oshima/Extender: Tito Dine in Treatment: 0 Clinic Level of Care Assessment Items TOOL 1 Quantity Score []  - Use when EandM and Procedure is performed on INITIAL visit 0 ASSESSMENTS - Nursing Assessment / Reassessment X - General Physical Exam (combine w/ comprehensive assessment (listed just below) when 1 20 performed on new pt. evals) X- 1 25 Comprehensive Assessment (HX, ROS, Risk Assessments, Wounds Hx, etc.) ASSESSMENTS - Wound and Skin Assessment / Reassessment []  - Dermatologic / Skin Assessment (not related to wound area) 0 ASSESSMENTS - Ostomy and/or Continence Assessment and Care []  - Incontinence Assessment and Management 0 []  - 0 Ostomy Care Assessment and Management (repouching, etc.) PROCESS - Coordination of Care X - Simple Patient / Family Education for ongoing care 1 15 []  - 0 Complex (extensive) Patient / Family Education for ongoing care []  - 0 Staff obtains Programmer, systems, Records, Test Results / Process Orders []  - 0 Staff telephones HHA, Nursing Homes / Clarify orders / etc []  - 0 Routine Transfer to another Facility (non-emergent condition) []  - 0 Routine Hospital Admission (non-emergent condition) X- 1 15 New Admissions / Biomedical engineer / Ordering NPWT, Apligraf, etc. []  - 0 Emergency Hospital Admission (emergent condition) PROCESS - Special  Needs []  - Pediatric / Minor Patient Management 0 []  - 0 Isolation Patient Management []  - 0 Hearing / Language / Visual special needs []  - 0 Assessment of Community assistance (transportation, D/C planning, etc.) []  - 0 Additional assistance / Altered mentation []  - 0 Support Surface(s) Assessment (bed, cushion, seat, etc.) [name], [name] ([id number]) INTERVENTIONS -  Miscellaneous []  - External ear exam 0 []  - 0 Patient Transfer (multiple staff / Civil Service fast streamer / Similar devices) []  - 0 Simple Staple / Suture removal (25 or less) []  - 0 Complex Staple / Suture removal (26 or more) []  - 0 Hypo/Hyperglycemic Management (do not check if billed separately) X- 1 15 Ankle / Brachial Index (ABI) - do not check if billed separately Has the patient been seen at the hospital within the last three years: Yes Total Score: 90 Level Of Care: New/Established - Level 3 Electronic Signature(s) Signed: 02/06/2019 5:43:16 PM By: Gretta Cool, BSN, RN, CWS, Kim RN, BSN Entered By: Gretta Cool, BSN, RN, CWS, Kim on 02/01/2019 15:12:03 [name], [name] ([id number]) -------------------------------------------------------------------------------- Encounter Discharge Information Details Patient Name: Glenn Standard B. Date of Service: 02/01/2019 2:15 PM Medical Record Number: [id number] Patient Account Number: [id number] Date of Birth/Sex: 1955-10-27 (63 y.o. M) Treating RN: Montey Hora Primary Care Arad Burston: Park Liter Other Clinician: Referring Athziry Millican: Eulogio Ditch Treating Lilias Lorensen/Extender: Tito Dine in Treatment: 0 Encounter Discharge Information Items Post Procedure Vitals Discharge Condition: Stable Temperature (F): 97.9 Ambulatory Status: Wheelchair Pulse (bpm): 78 Discharge Destination: Home Respiratory Rate (breaths/min): 18 Transportation: Private Auto Blood Pressure (mmHg): 114/76 Accompanied By: spouse Schedule Follow-up Appointment: Yes Clinical Summary of  Care: Electronic Signature(s) Signed: 02/01/2019 4:27:27 PM By: Montey Hora Entered By: Montey Hora on 02/01/2019 16:27:27 [name], [name] ([id number]) -------------------------------------------------------------------------------- Lower Extremity Assessment Details Patient Name: Glenn Standard B. Date of Service: 02/01/2019 2:15 PM Medical Record Number: [id number] Patient Account Number: [id number] Date of Birth/Sex: July 05, 1956 (63 y.o. M) Treating RN: Montey Hora Primary Care Daisean Brodhead: Park Liter Other Clinician: Referring Bernadett Milian: Eulogio Ditch Treating Elliotte Marsalis/Extender: Ricard Dillon Weeks in Treatment: 0 Edema Assessment Assessed: [Left: No] [Right: No] Edema: [Left: No] [Right: No] Vascular Assessment Pulses: Dorsalis Pedis Palpable: [Left:Yes] Popliteal Palpable: [Right:Yes] Notes ABI on 03/16/2018 L 1.11 R 0.99 Electronic Signature(s) Signed: 02/01/2019 5:19:05 PM By: Montey Hora Entered By: Montey Hora on 02/01/2019 14:20:08 Trier, [name] ([id number]) -------------------------------------------------------------------------------- Multi Wound Chart Details Patient Name: Glenn Standard B. Date of Service: 02/01/2019 2:15 PM Medical Record Number: [id number] Patient Account Number: [id number] Date of Birth/Sex: 11-15-1955 (63 y.o. 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 N/A N/A Comorbid  History: Arrhythmia, Hypertension, N/A N/A Peripheral Arterial Disease, Type II Diabetes, Dementia Date Acquired: 01/31/2018 N/A N/A Weeks of Treatment: 0 N/A N/A Wound Status: Open N/A N/A Pending Amputation on Yes N/A N/A Presentation: Measurements L x W x D 14.5x13x0.1 N/A N/A (cm) Area (cm) : 148.048 N/A N/A Volume (cm) : 14.805 N/A N/A Classification: Grade 2 N/A N/A Exudate Amount: Large N/A N/A Exudate Type: Serous N/A N/A Exudate Color: amber N/A N/A Wound Margin: Flat and Intact N/A N/A Granulation Amount: Small (1-33%) N/A N/A Granulation Quality: Pink N/A N/A Necrotic Amount: Large (67-100%) N/A N/A Exposed Structures: Fat Layer (Subcutaneous N/A N/A Tissue) Exposed: Yes Fascia: No Kilcrease, [name] ([id number]) Tendon: No Muscle: No Joint: No Bone: No Epithelialization: None N/A N/A Debridement: Debridement - Excisional N/A N/A Pre-procedure 15:00 N/A N/A Verification/Time Out Taken:  Pain Control: Lidocaine N/A N/A Tissue Debrided: Subcutaneous, Slough N/A N/A Level: Skin/Subcutaneous Tissue N/A N/A Debridement Area (sq cm): 12 N/A N/A Instrument: Curette N/A N/A Bleeding: Minimum N/A N/A Hemostasis Achieved: Pressure N/A N/A Debridement Treatment Procedure was tolerated well N/A N/A Response: Post Debridement 14.5x13x0.1 N/A N/A Measurements L x W x D (cm) Post Debridement Volume: 14.805 N/A N/A (cm) Procedures Performed: Debridement N/A N/A Treatment Notes Electronic Signature(s) Signed: 02/01/2019 6:06:19 PM By: Linton Ham MD Entered By: Linton Ham on 02/01/2019 15:59:07 Birdwell, [name] ([id number]) -------------------------------------------------------------------------------- Multi-Disciplinary Care Plan Details Patient Name: [name]. Date of Service: 02/01/2019 2:15 PM Medical Record Number: [id number] Patient Account Number: [id number] Date of Birth/Sex: [birth year] (63 y.o. M) Treating RN: Cornell Barman Primary Care  Muna Demers: Park Liter Other Clinician: Referring Shyquan Stallbaumer: Eulogio Ditch Treating Donella Pascarella/Extender: Tito Dine in Treatment: 0 Active Inactive Necrotic Tissue Nursing Diagnoses: Impaired tissue integrity related to necrotic/devitalized tissue Goals: Necrotic/devitalized tissue will be minimized in the wound bed Date Initiated: 02/01/2019 Target Resolution Date: 02/15/2019 Goal Status: Active Interventions: Assess patient pain level pre-, during and post procedure and prior to discharge Treatment Activities: Apply topical anesthetic as ordered : 02/01/2019 Notes: Orientation to the Wound Care Program Nursing Diagnoses: Knowledge deficit related to the wound healing center program Goals: Patient/caregiver will verbalize understanding of the [facility] Date Initiated: 02/01/2019 Target Resolution Date: 02/15/2019 Goal Status: Active Interventions: Provide education on orientation to the wound center Notes: Pain, Acute or Chronic Nursing Diagnoses: Pain Management - Non-cyclic Chronic Pain Pain, acute or chronic: actual or potential Goals: Patient will verbalize adequate pain control and receive pain control interventions during procedures as needed Date Initiated: 02/01/2019 Target Resolution Date: 02/08/2019 CONLIN, [name] ([id number]) Goal Status: Active Interventions: Complete pain assessment as per visit requirements Treatment Activities: Administer pain control measures as ordered : 02/01/2019 Notes: Wound/Skin Impairment Nursing Diagnoses: Knowledge deficit related to smoking impact on wound healing Goals: Patient/caregiver will verbalize understanding of skin care regimen Date Initiated: 02/01/2019 Target Resolution Date: 02/08/2019 Goal Status: Active Ulcer/skin breakdown will have a volume reduction of 30% by week 4 Date Initiated: 02/01/2019 Target Resolution Date: 03/08/2019 Goal Status: Active Interventions: Assess ulceration(s) every  visit Treatment Activities: Referred to DME Yalonda Sample for dressing supplies : 02/01/2019 Skin care regimen initiated : 02/01/2019 Notes: Electronic Signature(s) Signed: 02/06/2019 5:43:16 PM By: Gretta Cool, BSN, RN, CWS, Kim RN, BSN Entered By: Gretta Cool, BSN, RN, CWS, Kim on 02/01/2019 15:06:36 [place]ster, [name] ([id number]) -------------------------------------------------------------------------------- Pain Assessment Details Patient Name: Glenn Standard B. Date of Service: 02/01/2019 2:15 PM Medical Record Number: [id number] Patient Account Number: [id number] Date of Birth/Sex: 09/30/1955 (63 y.o. M) Treating RN: Montey Hora Primary Care Remonia Otte: Park Liter Other Clinician: Referring Cannon Quinton: Eulogio Ditch Treating Lovette Merta/Extender: Tito Dine in Treatment: 0 Active Problems Location of Pain Severity and Description of Pain Patient Has Paino Yes Site Locations Pain Location: Pain in Ulcers With Dressing Change: Yes Duration of the Pain. Constant / Intermittento Intermittent Pain Management and Medication Current Pain Management: Electronic Signature(s) Signed: 02/01/2019 5:19:05 PM By: Montey Hora Entered By: Montey Hora on 02/01/2019 14:10:54 Ossa, [name] ([id number]) -------------------------------------------------------------------------------- Patient/Caregiver Education Details Patient Name: [name]. Date of Service: 02/01/2019 2:15 PM Medical Record Number: [id number] Patient Account Number: [id number] Date of Birth/Gender: [birth year] (63 y.o. M) Treating RN: Cornell Barman Primary Care Physician: Park Liter Other Clinician: Referring Physician: Eulogio Ditch Treating Physician/Extender: Tito Dine in Treatment: 0 Education Assessment Education Provided To:  Patient Education Topics Provided Wound Debridement: Handouts: Wound Debridement Methods: Demonstration, Explain/Verbal Responses: State content correctly Wound/Skin  Impairment: Handouts: Caring for Your Ulcer Methods: Demonstration, Explain/Verbal Responses: State content correctly Electronic Signature(s) Signed: 02/06/2019 5:43:16 PM By: Gretta Cool, BSN, RN, CWS, Kim RN, BSN Entered By: Gretta Cool, BSN, RN, CWS, Kim on 02/01/2019 15:12:44 Feldstein, [name] ([id number]) -------------------------------------------------------------------------------- Wound Assessment Details Patient Name: Glenn Standard B. Date of Service: 02/01/2019 2:15 PM Medical Record Number: [id number] Patient Account Number: [id number] Date of Birth/Sex: [birth year] (63 y.o. 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 Wounding Event: Surgical Injury Etiology: Date Acquired: 01/31/2018 Wound Status: Open Weeks Of Treatment: 0 Comorbid Arrhythmia, Hypertension, Peripheral Arterial Clustered Wound: No History: Disease, Type II Diabetes, Dementia Pending Amputation On Presentation Photos Wound Measurements Length: (cm) 14.5 Width: (cm) 13 Depth: (cm) 0.1 Area: (cm) 148.048 Volume: (cm) 14.805 % Reduction in Area: % Reduction in Volume: Epithelialization: None Tunneling: No Undermining: No Wound Description Classification: Grade 2 Wound Margin: Flat and Intact Exudate Amount: Large Exudate Type: Serous Exudate Color: amber Foul Odor After Cleansing: No Slough/Fibrino Yes Wound Bed Granulation Amount: Small (1-33%) Exposed Structure Granulation Quality: Pink Fascia Exposed: No Necrotic Amount: Large (67-100%) Fat Layer (Subcutaneous Tissue) Exposed: Yes Necrotic Quality: Adherent Slough Tendon Exposed: No Muscle Exposed: No Joint Exposed: No Bone  Exposed: No Treatment Notes Mathurin, [name] ([id number]) Wound #4 (Right Amputation Site - Below Knee) Notes Dakins soaked gauze, abd, kerlix secured with tape Electronic Signature(s) Signed: 02/01/2019 5:19:05 PM By: Montey Hora Entered By: Montey Hora on 02/01/2019 14:18:45 Wenig, [name] ([id number]) -------------------------------------------------------------------------------- Vitals Details Patient Name: Glenn Standard B. Date of Service: 02/01/2019 2:15 PM Medical Record Number: [id number] Patient Account Number: [id number] Date of Birth/Sex: [birth year] (63 y.o. M) Treating RN: Montey Hora Primary Care Siana Panameno: Park Liter Other Clinician: Referring Caydee Talkington: Eulogio Ditch Treating Abayomi Pattison/Extender: Tito Dine in Treatment: 0 Vital Signs Time Taken: 14:10 Temperature (F): 97.9 Height (in): 70 Pulse (bpm): 78 Source: Measured Respiratory Rate (breaths/min): 18 Weight (lbs): 175 Blood Pressure (mmHg): 114/76 Source: Measured Reference Range: 80 - 120 mg / dl Body Mass Index (BMI): 25.1 Electronic Signature(s) Signed: 02/01/2019 5:19:05 PM By: Montey Hora Entered By: Montey Hora on 02/01/2019 14:12:22

## 2019-02-07 NOTE — Progress Notes (Signed)
[name] ([id number]) Visit Report for 02/01/2019 Debridement Details Patient Name: [name], [name]. Date of Service: 02/01/2019 2:15 PM Medical Record Number: [id number] Patient Account Number: [id number] Date of Birth/Sex: [birth year] (63 y.o. M) Treating RN: Cornell Barman Primary Care Provider: Park Liter Other Clinician: Referring Provider: Eulogio Ditch Treating Provider/Extender: Tito Dine in Treatment: 0 Debridement Performed for Wound #4 Right Amputation Site - Below Knee Assessment: Performed By: Physician Ricard Dillon, MD Debridement Type: Debridement Severity of Tissue Pre Fat layer exposed Debridement: Level of Consciousness (Pre- Awake and Alert procedure): Pre-procedure Verification/Time Yes - 15:00 Out Taken: Start Time: 15:00 Pain Control: Lidocaine Total Area Debrided (L x W): 3 (cm) x 4 (cm) = 12 (cm) Tissue and other material Viable, Non-Viable, Slough, Subcutaneous, Slough debrided: Level: Skin/Subcutaneous Tissue Debridement Description: Excisional Instrument: Curette Bleeding: Minimum Hemostasis Achieved: Pressure End Time: 15:07 Response to Treatment: Procedure was tolerated well Level of Consciousness Awake and Alert (Post-procedure): Post Debridement Measurements of Total Wound Length: (cm) 14.5 Width: (cm) 13 Depth: (cm) 0.1 Volume: (cm) 14.805 Character of Wound/Ulcer Post Debridement: Requires Further Debridement Severity of Tissue Post Debridement: Fat layer exposed Post Procedure Diagnosis Same as Pre-procedure Electronic Signature(s) Signed: 02/01/2019 6:06:19 PM By: Linton Ham MD Signed: 02/06/2019 5:43:16 PM By: Gretta Cool, BSN, RN, CWS, Kim RN, BSN Entered By: Linton Ham on 02/01/2019 15:59:24 [name] ([id number]) -------------------------------------------------------------------------------- HPI Details Patient Name: Glenn Standard B. Date of Service: 02/01/2019 2:15 PM Medical Record  Number: [id number] Patient Account Number: [id number] Date of Birth/Sex: 1956-01-13 (63 y.o. M) Treating RN: Cornell Barman Primary Care Provider: Park Liter Other Clinician: Referring Provider: Eulogio Ditch Treating Provider/Extender: Tito Dine in Treatment: 0 History of Present Illness HPI Description: 03/03/18- He is here for initial evaluation of right toe wounds and tissue necrosis. He saw Dr. Vickki Muff on 7/23 where he was noted to have progressive ischemic changes to his toes. He was referred from Dr. Fellows office to the wound clinic for possible hyperbaric oxygen therapy. At this time, based on the information we have at hand, there is no diagnosis that would qualify him for hyperbaric oxygen therapy. He underwent an angiogram on 6/13 by Dr. 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 He did not have intervention on 6/13. arterial studies performed on 6/19 showed resting ABI to the 0.99 (right) and 1.09 (left). 03/10/18-He is seen in follow up evaluation. Plain film x-ray ordered last week revealed no evidence of acute osseous abnormality and dorsal soft tissue swelling of the midfoot. Culture that was obtained last week from the right great toe was negative. He has an appointment with Dr Lucky Cowboy on 8/23. 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 The right great toe with a small, superficial, open area with resolution of erythema and improvement in edema; will continue with medihoney. He will follow up next week 03/17/18-He is seen  in follow-up evaluation. He was able to reschedule his appointment with vascular medicine and was seen on 8/14. In office ABIs are similar to studies on 6/19, right toe waveform is blunted with known small vessel disease; they are recommending  compression therapy which the patient is refusing to wear. 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 Vickki Muff. The patient's wife is going to try and move that appointment up to next week. The patient does exhibit right foot pain consistent with rest pain, relieved with dependent position. There is cyanotic discoloration to right toes 1-3 and along the lateral aspect and heel of the right foot. The wound to the dorsal aspect of the right great toe is larger, dry; we will initiate santyl. He can follow up in 1-2 weeks 04/15/18 on evaluation today patient's right foot ulcer in regard to the fourth and fifth toes really appears to be no better. In fact this is pretty much a complete region of gangrene that this point. He also has microvascular disease and the remaining toes and the discoloration noted today is extremely cyanotic blue/purple. He actually was supposed to have Artie had invitation but unfortunately this is been delayed because there is some question as to whether or not he may actually have a G.I. bleed due to being severely anemic. He's also apparently having some issues with his heart which is also delayed the process at this point. Unfortunately in general nothing seems to be improving if anything his wife tells me that this seems to be getting worse. 05/09/18 on evaluation today patient actually appears to be doing worse in regard to the gangrene of his right foot. This is actually spread to all toes and actually up a portion of his foot as well. He has been referred by podiatry to Dr. dew, vascular surgery, in order to evaluate for a below the knee amputation. Nonetheless the patient is having some discomfort but his biggest  concern that he seems to be stuck on more than anything is how far up Dr. dew is going to have to take his leg off. This is a great concern for him. In fact he asked me the same question at least 4-5 times during evaluation today ADMISSION 02/01/2019 This is now a 63 year old man with type 2 diabetes and known PAD. 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 According to the history that his wife is giving [place] that the wound held together when the sutures were removed however gradually this opened and expanded. He was taken to the OR on 08/08/2018 for debridement. He had a wound VAC on this for several months which apparently resulted in good improvement with the wound but the patient states he could not tolerate it. Currently they are using Xeroform ABDs and gauze. They have been followed extensively vein and vascular by Dr. dew service. The patient had an angiogram on 01/13/2018 which showed a normal CFA, PFA and stents in both iliac arteries were patent. GEO, SLONE ([id number]) These were placed in March 2019. The patient had an MRI of the stump with contrast in December 2019. This suggested inflammatory changes within the anterior soft tissue of the remaining lower leg status post below-knee amputation there was some suggestion of a sinus tract. Adjacent to the soft tissue change there was marrow T2 hyperdensity and low-level enhancement which could reflect osteomyelitis. At this point I am not sure what was done about this report I will need to look back through the records. Past medical history; type 2 diabetes with known PAD, paroxysmal A. fib on Eliquis, iron deficiency anemia, hyperlipidemia, left occipital CVA, right external iliac stent 3/19. Electronic Signature(s) Signed: 02/01/2019 6:06:19 PM By: Dellia Nims,  Legrand Como MD Entered By: Linton Ham on 02/01/2019 16:07:00 [name], [name]  ([id number]) -------------------------------------------------------------------------------- Physical Exam Details Patient Name: [name], [name] Date of Service: 02/01/2019 2:15 PM Medical Record Number: [id number] Patient Account Number: [id number] Date of Birth/Sex: 11-14-1955 (63 y.o. M) Treating RN: Cornell Barman Primary Care Provider: Park Liter Other Clinician: Referring Provider: Eulogio Ditch Treating Provider/Extender: Tito Dine in Treatment: 0 Constitutional Sitting or standing Blood Pressure is within target range for patient.. Pulse regular and within target range for patient.Marland Kitchen Respirations regular, non-labored and within target range.. Temperature is normal and within the target range for the patient.Marland Kitchen appears in no distress. Eyes Conjunctivae clear. No discharge. Respiratory Respiratory effort is easy and symmetric bilaterally. Rate is normal at rest and on room air.. Bilateral breath sounds are clear and equal in all lobes with no wheezes, rales or rhonchi.. Cardiovascular Heart rhythm and rate regular, without murmur or gallop.. The right popliteal and right femoral pulses are palpable. Lymphatic None palpable in the right popliteal or inguinal area. Integumentary (Hair, Skin) No primary skin issue is seen. Psychiatric No evidence of depression, anxiety, or agitation. Calm, cooperative, and communicative. Appropriate interactions and affect.. Notes Wound exam; the patient has an extensive wound over the entirety of his right BKA stump that even goes posteriorly towards the popliteal fossa. This is 100% covered and tightly adherent necrotic material. Using a #5 curette the patient allowed me to debride about 1/8 of the surface area. He actually tolerated this fairly well. Hemostasis with direct pressure. At this point there is no obvious evidence of surrounding infection Electronic Signature(s) Signed: 02/01/2019 6:06:19 PM By: Linton Ham MD Entered  By: Linton Ham on 02/01/2019 16:09:30 [name], [name] ([id number]) -------------------------------------------------------------------------------- Physician Orders Details Patient Name: Glenn Standard B. Date of Service: 02/01/2019 2:15 PM Medical Record Number: [id number] Patient Account Number: [id number] Date of Birth/Sex: 18-Nov-1955 (63 y.o. M) Treating RN: Cornell Barman Primary Care Provider: Park Liter Other Clinician: Referring Provider: Eulogio Ditch Treating Provider/Extender: Tito Dine in Treatment: 0 Verbal / Phone Orders: No Diagnosis Coding Wound Cleansing Wound #4 Right Amputation Site - Below Knee o Cleanse wound with mild soap and water Anesthetic (add to Medication List) Wound #4 Right Amputation Site - Below Knee o Topical Lidocaine 4% cream applied to wound bed prior to debridement (In Clinic Only). Primary Wound Dressing Wound #4 Right Amputation Site - Below Knee o Other: - Dakins soaked gauze Secondary Dressing Wound #4 Right Amputation Site - Below Knee o ABD and Kerlix/Conform Dressing Change Frequency Wound #4 Right Amputation Site - Below Knee o Change dressing every day. Follow-up Appointments Wound #4 Right Amputation Site - Below Knee o Return Appointment in 1 week. Edema Control Wound #4 Right Amputation Site - Below Knee o Elevate legs to the level of the heart and pump ankles as often as possible Additional Orders / Instructions Wound #4 Right Amputation Site - Below Knee o Activity as tolerated Electronic Signature(s) Signed: 02/01/2019 6:06:19 PM By: Linton Ham MD Signed: 02/06/2019 5:43:16 PM By: Gretta Cool, BSN, RN, CWS, Kim RN, BSN Entered By: Gretta Cool, BSN, RN, CWS, Kim on 02/01/2019 15:10:39 [name], [name] ([id number]) -------------------------------------------------------------------------------- Problem List Details Patient Name: [name], [name] Date of Service: 02/01/2019 2:15 PM Medical Record  Number: [id number] Patient Account Number: [id number] Date of Birth/Sex: 03-20-1956 (63 y.o. M) Treating RN: Cornell Barman Primary Care Provider: Park Liter Other Clinician: Referring Provider: Eulogio Ditch Treating Provider/Extender: Tito Dine in Treatment: 0 Active Problems  ICD-10 Evaluated Encounter Code Description Active Date Today Diagnosis E11.51 Type 2 diabetes mellitus with diabetic peripheral angiopathy 02/01/2019 No Yes without gangrene 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 02/01/2019 No Yes limited to breakdown of skin E11.622 Type 2 diabetes mellitus with other skin ulcer 02/01/2019 No Yes Z89.511 Acquired absence of right leg below knee 02/01/2019 No Yes Inactive Problems Resolved Problems Electronic Signature(s) Signed: 02/01/2019 6:06:19 PM By: Linton Ham MD Entered By: Linton Ham on 02/01/2019 15:50:20 [name], [name] ([id number]) -------------------------------------------------------------------------------- Progress Note Details Patient Name: Glenn Standard B. Date of Service: 02/01/2019 2:15 PM Medical Record Number: [id number] Patient Account Number: [id number] Date of Birth/Sex: Nov 23, 1955 (63 y.o. M) Treating RN: Cornell Barman Primary Care Provider: Park Liter Other Clinician: Referring Provider: Eulogio Ditch Treating Provider/Extender: Tito Dine in Treatment: 0 Subjective History of Present Illness (HPI) 03/03/18- He is here for initial evaluation of right toe wounds and tissue necrosis. He saw Dr. Vickki Muff on 7/23 where he was noted to have progressive ischemic changes to his toes. He was referred from Dr. Fellows office to the wound clinic for possible hyperbaric oxygen therapy. At this time, based on the information we have at hand, there is no diagnosis that would qualify him for hyperbaric oxygen therapy. He underwent an angiogram on 6/13 by Dr. Lucky Cowboy which showed bilateral iliac artery stents  with no significant stenosis, minimal disease to the right CFA, right profunda, right SFA, right popliteal and three-vessel tibial runoff although both the posterior tibial and anterior tibial arteries are essentially occluded with foot and the peroneal artery terminates at the ankle; likely a combination of previous embolization from iliac disease as well as intermittent atrial fibrillation creating embolic particles. He did not have intervention on 6/13. arterial studies performed on 6/19 showed resting ABI to the 0.99 (right) and 1.09 (left). 03/10/18-He is seen in follow up evaluation. Plain film x-ray ordered last week revealed no evidence of acute osseous abnormality and dorsal soft tissue swelling of the midfoot. Culture that was obtained last week from the right great toe was negative. He has an appointment with Dr Lucky Cowboy on 8/23. 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 The right great toe with a small, superficial, open area with resolution of erythema and improvement in edema; will continue with medihoney. He will follow up next week 03/17/18-He is seen in follow-up evaluation. He was able to reschedule his appointment with vascular medicine and was seen on 8/14. In office ABIs are similar to studies on 6/19, right toe waveform is blunted with known small vessel disease; they are recommending compression therapy which the patient is refusing to wear. 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 Vickki Muff. The patient's wife is going to try and move that appointment up to next week. The patient does exhibit right foot pain consistent with rest pain, relieved with dependent position. There is cyanotic discoloration to right toes 1-3 and along the lateral aspect and heel of the right foot. The wound to the dorsal aspect of the right great toe is larger, dry; we will initiate santyl. He can follow up in 1-2 weeks 04/15/18 on evaluation today  patient's right foot ulcer in regard to the fourth and fifth toes really appears to be no better. In fact this is pretty much a complete region of gangrene that this point. He also has microvascular disease and the remaining toes and the discoloration noted today is extremely  cyanotic blue/purple. He actually was supposed to have Artie had invitation but unfortunately this is been delayed because there is some question as to whether or not he may actually have a G.I. bleed due to being severely anemic. He's also apparently having some issues with his heart which is also delayed the process at this point. Unfortunately in general nothing seems to be improving if anything his wife tells me that this seems to be getting worse. 05/09/18 on evaluation today patient actually appears to be doing worse in regard to the gangrene of his right foot. This is actually spread to all toes and actually up a portion of his foot as well. He has been referred by podiatry to Dr. dew, vascular surgery, in order to evaluate for a below the knee amputation. Nonetheless the patient is having some discomfort but his biggest concern that he seems to be stuck on more than anything is how far up Dr. dew is going to have to take his leg off. This is a great concern for him. In fact he asked me the same question at least 4-5 times during evaluation today ADMISSION 02/01/2019 This is now a 63 year old man with type 2 diabetes and known PAD. 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 According to the history that his wife is giving [place] that the wound held together when the sutures were removed however gradually this opened and expanded. He was taken to the OR on 08/08/2018 for debridement. He had a wound VAC on this for several months which apparently resulted in good improvement with the wound but the patient states he could not tolerate it. Currently they are using Xeroform ABDs and gauze. They have  been followed extensively vein and vascular by Dr. dew service. [name], [name] ([id number]) The patient had an angiogram on 01/13/2018 which showed a normal CFA, PFA and stents in both iliac arteries were patent. These were placed in March 2019. The patient had an MRI of the stump with contrast in December 2019. This suggested inflammatory changes within the anterior soft tissue of the remaining lower leg status post below-knee amputation there was some suggestion of a sinus tract. Adjacent to the soft tissue change there was marrow T2 hyperdensity and low-level enhancement which could reflect osteomyelitis. At this point I am not sure what was done about this report I will need to look back through the records. Past medical history; type 2 diabetes with known PAD, paroxysmal A. fib on Eliquis, iron deficiency anemia, hyperlipidemia, left occipital CVA, right external iliac stent 3/19. Patient History Information obtained from Patient. Allergies atorvastatin (Reaction: rash), gabapentin (Reaction: dizziness and vomiting), pentobarbital Family History No family history of Cancer, Diabetes, Heart Disease, Hereditary Spherocytosis, Hypertension, Kidney Disease, Lung Disease, Seizures, Stroke, Thyroid Problems, Tuberculosis. Social History Former smoker - ended on 01/31/2018, Marital Status - Married, Alcohol Use - Moderate, Drug Use - No History, Caffeine Use - Daily. Medical History Eyes Denies history of Cataracts, Glaucoma, Optic Neuritis Ear/Nose/Mouth/Throat Denies history of Chronic sinus problems/congestion, Middle ear problems Hematologic/Lymphatic Denies history of Anemia, Hemophilia, Human Immunodeficiency Virus, Lymphedema, Sickle Cell Disease Respiratory Denies history of Aspiration, Asthma, Chronic Obstructive Pulmonary Disease (COPD), Pneumothorax, Sleep Apnea, Tuberculosis Cardiovascular Patient has history of Arrhythmia - a-fib, Hypertension, Peripheral Arterial  Disease Denies history of Angina, Congestive Heart Failure, Coronary Artery Disease, Deep Vein Thrombosis, Hypotension, Myocardial Infarction, Peripheral Venous Disease, Phlebitis, Vasculitis Gastrointestinal Denies history of Cirrhosis , Colitis, Crohn s, Hepatitis  A, Hepatitis B, Hepatitis C Endocrine Patient has history of Type II Diabetes Denies history of Type I Diabetes Genitourinary Denies history of End Stage Renal Disease Immunological Denies history of Lupus Erythematosus, Raynaud s, Scleroderma Integumentary (Skin) Denies history of History of Burn, History of pressure wounds Musculoskeletal Denies history of Gout, Rheumatoid Arthritis, Osteoarthritis, Osteomyelitis Neurologic Patient has history of Dementia - stroke Denies history of Neuropathy, Quadriplegia, Paraplegia, Seizure Disorder Oncologic Denies history of Received Chemotherapy, Received Radiation Psychiatric Denies history of Anorexia/bulimia, Confinement Anxiety Mendenhall, Graves B. ([id number]) Hospitalization/Surgery History - Stroke. Medical And Surgical History Notes Musculoskeletal R BKA Review of Systems (ROS) Cardiovascular Denies complaints or symptoms of Chest pain, LE edema. Gastrointestinal Denies complaints or symptoms of Frequent diarrhea, Nausea, Vomiting. Endocrine Denies complaints or symptoms of Hepatitis, Thyroid disease, Polydypsia (Excessive Thirst). Genitourinary Denies complaints or symptoms of Kidney failure/ Dialysis, Incontinence/dribbling. Immunological Denies complaints or symptoms of Hives, Itching. Integumentary (Skin) Complains or has symptoms of Wounds. Denies complaints or symptoms of Bleeding or bruising tendency, Breakdown, Swelling. Objective Constitutional Sitting or standing Blood Pressure is within target range for patient.. Pulse regular and within target range for patient.Marland Kitchen Respirations regular, non-labored and within target range.. Temperature is normal and  within the target range for the patient.Marland Kitchen appears in no distress. Vitals Time Taken: 2:10 PM, Height: 70 in, Source: Measured, Weight: 175 lbs, Source: Measured, BMI: 25.1, Temperature: 97.9 F, Pulse: 78 bpm, Respiratory Rate: 18 breaths/min, Blood Pressure: 114/76 mmHg. Eyes Conjunctivae clear. No discharge. Respiratory Respiratory effort is easy and symmetric bilaterally. Rate is normal at rest and on room air.. Bilateral breath sounds are clear and equal in all lobes with no wheezes, rales or rhonchi.. Cardiovascular Heart rhythm and rate regular, without murmur or gallop.. The right popliteal and right femoral pulses are palpable. Lymphatic None palpable in the right popliteal or inguinal area. Psychiatric No evidence of depression, anxiety, or agitation. Calm, cooperative, and communicative. Appropriate interactions and affect.. General Notes: Wound exam; the patient has an extensive wound over the entirety of his right BKA stump that even goes [name], [name] ([id number]) posteriorly towards the popliteal fossa. This is 100% covered and tightly adherent necrotic material. Using a #5 curette the patient allowed me to debride about 1/8 of the surface area. He actually tolerated this fairly well. Hemostasis with direct pressure. At this point there is no obvious evidence of surrounding infection Integumentary (Hair, Skin) No primary skin issue is seen. Wound #4 status is Open. Original cause of wound was Surgical Injury. The wound is located on the Right Amputation Site - Below Knee. The wound measures 14.5cm length x 13cm width x 0.1cm depth; 148.048cm^2 area and 14.805cm^3 volume. There is Fat Layer (Subcutaneous Tissue) Exposed exposed. There is no tunneling or undermining noted. There is a large amount of serous drainage noted. The wound margin is flat and intact. There is small (1-33%) pink granulation within the wound bed. There is a large (67-100%) amount of necrotic tissue  within the wound bed including Adherent Slough. Assessment Active Problems ICD-10 Type 2 diabetes mellitus with diabetic peripheral angiopathy without gangrene Non-pressure chronic ulcer of other part of right lower leg limited to breakdown of skin Type 2 diabetes mellitus with other skin ulcer Acquired absence of right leg below knee Procedures Wound #4 Pre-procedure diagnosis of Wound #4 is a Diabetic Wound/Ulcer of the Lower Extremity located on the Right Amputation Site - Below Knee .Severity of Tissue Pre Debridement is: Fat layer exposed. There was a Excisional  Skin/Subcutaneous Tissue Debridement with a total area of 12 sq cm performed by Ricard Dillon, MD. With the following instrument(s): Curette to remove Viable and Non-Viable tissue/material. Material removed includes Subcutaneous Tissue and Slough and after achieving pain control using Lidocaine. No specimens were taken. A time out was conducted at 15:00, prior to the start of the procedure. A Minimum amount of bleeding was controlled with Pressure. The procedure was tolerated well. Post Debridement Measurements: 14.5cm length x 13cm width x 0.1cm depth; 14.805cm^3 volume. Character of Wound/Ulcer Post Debridement requires further debridement. Severity of Tissue Post Debridement is: Fat layer exposed. 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 The extensive wound area makes choice of dressings difficult. We use Dakin's wet-to-dry with ABDs/Curlex and conform. This will need to be changed daily. He will need a series of difficult debridements 2. The patient had an amniotic membrane product placed by Dr. dew on 12/28/2018 I am not sure about the success of this. 3. I spent a long time discussing the approach to this area. He will need a difficult set of debridements and then a very long time to affect epithelialization of this large area. I did discuss stump revision with the patient he is not interested currently but I asked him to discuss this with his wife. Operative debridement of this area would be ideal but I am not sure if Dr. dew would be willing to do this or if there is another surgeon who would be willing to do this in the OR. Notable that he actually tolerated this quite well. If we could just get him to calm down a bit I think I could affect a very large debridement of this area. 4. According to vein and vascular notes they feel that there is adequate blood flow for healing here. His pulses are palpable the stump is warm Electronic Signature(s) Signed: 02/01/2019 6:06:19 PM By: Linton Ham MD Entered By: Linton Ham on 02/01/2019 16:13:01 [name], [name] ([id number]) -------------------------------------------------------------------------------- ROS/PFSH Details Patient Name: Glenn Standard B. Date of Service: 02/01/2019 2:15 PM Medical Record Number: [id number] Patient Account Number: [id number] Date of Birth/Sex: Jun 22, 1956 (63 y.o. M) Treating  RN: Montey Hora Primary Care Provider: Park Liter Other Clinician: Referring Provider: Eulogio Ditch Treating Provider/Extender: Tito Dine in Treatment: 0 Information Obtained From Patient Cardiovascular Complaints and Symptoms: Negative for: Chest pain; LE edema Medical History: Positive for: Arrhythmia - a-fib; Hypertension; Peripheral Arterial Disease Negative for: Angina; Congestive Heart Failure; Coronary Artery Disease; Deep Vein Thrombosis; Hypotension; Myocardial Infarction; Peripheral Venous Disease; Phlebitis; Vasculitis Gastrointestinal Complaints and Symptoms: Negative for: Frequent diarrhea; Nausea; Vomiting Medical History:  Negative for: Cirrhosis ; Colitis; Crohnos; Hepatitis A; Hepatitis B; Hepatitis C Endocrine Complaints and Symptoms: Negative for: Hepatitis; Thyroid disease; Polydypsia (Excessive Thirst) Medical History: Positive for: Type II Diabetes Negative for: Type I Diabetes Time with diabetes: 5 years Treated with: Oral agents Blood sugar tested every day: No Genitourinary Complaints and Symptoms: Negative for: Kidney failure/ Dialysis; Incontinence/dribbling Medical History: Negative for: End Stage Renal Disease Immunological Complaints and Symptoms: Negative for: Hives; Itching Medical History: Negative for: Lupus Erythematosus; Raynaudos; Scleroderma [name], [name] ([id number]) Integumentary (Skin) Complaints and Symptoms: Positive for: Wounds Negative for: Bleeding or bruising tendency; Breakdown; Swelling Medical History: Negative for: History of Burn; History of pressure wounds Eyes Medical History: Negative for: Cataracts; Glaucoma; Optic Neuritis Ear/Nose/Mouth/Throat Medical History: Negative for: Chronic sinus problems/congestion; Middle ear problems Hematologic/Lymphatic Medical History: Negative for: Anemia; Hemophilia; Human Immunodeficiency Virus; Lymphedema; Sickle Cell Disease Respiratory Medical  History: Negative for: Aspiration; Asthma; Chronic Obstructive Pulmonary Disease (COPD); Pneumothorax; Sleep Apnea; Tuberculosis Musculoskeletal Medical History: Negative for: Gout; Rheumatoid Arthritis; Osteoarthritis; Osteomyelitis Past Medical History Notes: R BKA Neurologic Medical History: Positive for: Dementia - stroke Negative for: Neuropathy; Quadriplegia; Paraplegia; Seizure Disorder Oncologic Medical History: Negative for: Received Chemotherapy; Received Radiation Psychiatric Medical History: Negative for: Anorexia/bulimia; Confinement Anxiety Immunizations Pneumococcal Vaccine: Received Pneumococcal Vaccination: Yes Implantable Devices None [name], [name] ([id number]) Hospitalization / Surgery History Type of Hospitalization/Surgery Stroke Family and Social History Cancer: No; Diabetes: No; Heart Disease: No; Hereditary Spherocytosis: No; Hypertension: No; Kidney Disease: No; Lung Disease: No; Seizures: No; Stroke: No; Thyroid Problems: No; Tuberculosis: No; Former smoker - ended on 01/31/2018; Marital Status - Married; Alcohol Use: Moderate; Drug Use: No History; Caffeine Use: Daily; Financial Concerns: No; Food, Clothing or Shelter Needs: No; Support System Lacking: No; Transportation Concerns: No Electronic Signature(s) Signed: 02/01/2019 5:19:05 PM By: Montey Hora Signed: 02/01/2019 6:06:19 PM By: Linton Ham MD Entered By: Montey Hora on 02/01/2019 14:13:21 Dickard, [name] ([id number]) -------------------------------------------------------------------------------- SuperBill Details Patient Name: Glenn Standard B. Date of Service: 02/01/2019 Medical Record Number: [id number] Patient Account Number: [id number] Date of Birth/Sex: [birth year] (63 y.o. M) Treating RN: Cornell Barman Primary Care Provider: Park Liter Other Clinician: Referring Provider: Eulogio Ditch Treating Provider/Extender: Tito Dine in Treatment: 0 Diagnosis  Coding ICD-10 Codes Code Description E11.51 Type 2 diabetes mellitus with diabetic peripheral angiopathy without gangrene 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 limited to breakdown of skin E11.622 Type 2 diabetes mellitus with other skin ulcer Z89.511 Acquired absence of right leg below knee Facility Procedures CPT4 Code Description: 73220254 99213 - WOUND CARE VISIT-LEV 3 EST PT Modifier: Quantity: 1 CPT4 Code Description: [id number] 11042 - DEB SUBQ TISSUE 20 SQ CM/< ICD-10 Diagnosis Description 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 limi Modifier: ted to breakdow Quantity: 1 n of skin Physician Procedures CPT4 Code Description: [id number] WC PHYS LEVEL 3 o NEW PT ICD-10 Diagnosis Description E11.51 Type 2 diabetes mellitus with diabetic peripheral angiopathy wit 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

## 2019-02-08 ENCOUNTER — Encounter: Payer: Medicare HMO | Admitting: Internal Medicine

## 2019-02-08 ENCOUNTER — Other Ambulatory Visit: Payer: Self-pay

## 2019-02-08 DIAGNOSIS — E11621 Type 2 diabetes mellitus with foot ulcer: Secondary | ICD-10-CM | POA: Diagnosis not present

## 2019-02-08 DIAGNOSIS — T8789 Other complications of amputation stump: Secondary | ICD-10-CM | POA: Diagnosis not present

## 2019-02-08 DIAGNOSIS — L97811 Non-pressure chronic ulcer of other part of right lower leg limited to breakdown of skin: Secondary | ICD-10-CM | POA: Diagnosis not present

## 2019-02-08 DIAGNOSIS — Z89511 Acquired absence of right leg below knee: Secondary | ICD-10-CM | POA: Diagnosis not present

## 2019-02-08 DIAGNOSIS — D509 Iron deficiency anemia, unspecified: Secondary | ICD-10-CM | POA: Diagnosis not present

## 2019-02-08 DIAGNOSIS — Z7901 Long term (current) use of anticoagulants: Secondary | ICD-10-CM | POA: Diagnosis not present

## 2019-02-08 DIAGNOSIS — E1151 Type 2 diabetes mellitus with diabetic peripheral angiopathy without gangrene: Secondary | ICD-10-CM | POA: Diagnosis not present

## 2019-02-08 DIAGNOSIS — E11622 Type 2 diabetes mellitus with other skin ulcer: Secondary | ICD-10-CM | POA: Diagnosis not present

## 2019-02-08 DIAGNOSIS — L97812 Non-pressure chronic ulcer of other part of right lower leg with fat layer exposed: Secondary | ICD-10-CM | POA: Diagnosis not present

## 2019-02-08 DIAGNOSIS — I480 Paroxysmal atrial fibrillation: Secondary | ICD-10-CM | POA: Diagnosis not present

## 2019-02-09 NOTE — Progress Notes (Signed)
WYNTON, HUFSTETLER ([id number]) Visit Report for 02/08/2019 Debridement Details Patient Name: [name], [name]. Date of Service: 02/08/2019 3:15 PM Medical Record Number: [id number] Patient Account Number: [id number] Date of Birth/Sex: [birth year] (63 y.o. M) Treating RN: Primary Care Provider: Park Liter Other Clinician: Referring Provider: Park Liter Treating Provider/Extender: Tito Dine in Treatment: 1 Debridement Performed for Wound #4 Right Amputation Site - Below Knee Assessment: Performed By: Physician Ricard Dillon, MD Debridement Type: Debridement Severity of Tissue Pre Fat layer exposed Debridement: Level of Consciousness (Pre- Awake and Alert procedure): Pre-procedure Verification/Time Yes - 16:02 Out Taken: Start Time: 16:02 Pain Control: Lidocaine Total Area Debrided (L x W): 6 (cm) x 4 (cm) = 24 (cm) Tissue and other material Viable, Non-Viable, Slough, Subcutaneous, Slough debrided: Level: Skin/Subcutaneous Tissue Debridement Description: Excisional Instrument: Curette Bleeding: Moderate Hemostasis Achieved: Pressure End Time: 16:08 Response to Treatment: Procedure was tolerated well Level of Consciousness Awake and Alert (Post-procedure): Post Debridement Measurements of Total Wound Length: (cm) 13 Width: (cm) 12 Depth: (cm) 2 Volume: (cm) 245.044 Character of Wound/Ulcer Post Debridement: Requires Further Debridement Severity of Tissue Post Debridement: Fat layer exposed Post Procedure Diagnosis Same as Pre-procedure Electronic Signature(s) Signed: 02/08/2019 5:45:20 PM By: Linton Ham MD Entered By: Linton Ham on 02/08/2019 16:50:40 [name], [name] ([id number]) -------------------------------------------------------------------------------- HPI Details Patient Name: Glenn Standard B. Date of Service: 02/08/2019 3:15 PM Medical Record Number: [id number] Patient Account Number: [id number] Date of Birth/Sex: 03-May-1956  (63 y.o. M) Treating RN: Primary Care Provider: Park Liter Other Clinician: Referring Provider: Park Liter Treating Provider/Extender: Tito Dine in Treatment: 1 History of Present Illness HPI Description: 03/03/18- He is here for initial evaluation of right toe wounds and tissue necrosis. He saw Dr. Vickki Muff on 7/23 where he was noted to have progressive ischemic changes to his toes. He was referred from Dr. Fellows office to the wound clinic for possible hyperbaric oxygen therapy. At this time, based on the information we have at hand, there is no diagnosis that would qualify him for hyperbaric oxygen therapy. He underwent an angiogram on 6/13 by Dr. 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 He did not have intervention on 6/13. arterial studies performed on 6/19 showed resting ABI to the 0.99 (right) and 1.09 (left). 03/10/18-He is seen in follow up evaluation. Plain film x-ray ordered last week revealed no evidence of acute osseous abnormality and dorsal soft tissue swelling of the midfoot. Culture that was obtained last week from the right great toe was negative. He has an appointment with Dr Lucky Cowboy on 8/23. 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 The right great toe with a small, superficial, open area with resolution of erythema and improvement in edema; will continue with medihoney. He will follow up next week 03/17/18-He is seen in follow-up evaluation. He was able to reschedule his appointment with vascular medicine and  was seen on 8/14. In office ABIs are similar to studies on 6/19, right toe waveform is blunted with known small vessel disease; they are recommending compression therapy which the patient is refusing to wear. The right 4-5 toe amputation has been  deferred to podiatry, Dr. Vickki Muff. The patient's wife is going to try and move that appointment up to next week. The patient does exhibit right foot pain consistent with rest pain, relieved with dependent position. There is cyanotic discoloration to right toes 1-3 and along the lateral aspect and heel of the right foot. The wound to the dorsal aspect of the right great toe is larger, dry; we will initiate santyl. He can follow up in 1-2 weeks 04/15/18 on evaluation today patient's right foot ulcer in regard to the fourth and fifth toes really appears to be no better. In fact this is pretty much a complete region of gangrene that this point. He also has microvascular disease and the remaining toes and the discoloration noted today is extremely cyanotic blue/purple. He actually was supposed to have Artie had invitation but unfortunately this is been delayed because there is some question as to whether or not he may actually have a G.I. bleed due to being severely anemic. He's also apparently having some issues with his heart which is also delayed the process at this point. Unfortunately in general nothing seems to be improving if anything his wife tells me that this seems to be getting worse. 05/09/18 on evaluation today patient actually appears to be doing worse in regard to the gangrene of his right foot. This is actually spread to all toes and actually up a portion of his foot as well. He has been referred by podiatry to Dr. dew, vascular surgery, in order to evaluate for a below the knee amputation. Nonetheless the patient is having some discomfort but his biggest concern that he seems to be stuck on more than anything is how far up Dr. dew is going to have  to take his leg off. This is a great concern for him. In fact he asked me the same question at least 4-5 times during evaluation today ADMISSION 02/01/2019 This is now a 63 year old man with type 2 diabetes and known PAD. 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 According to the history that his wife is giving [place] that the wound held together when the sutures were removed however gradually this opened and expanded. He was taken to the OR on 08/08/2018 for debridement. He had a wound VAC on this for several months which apparently resulted in good improvement with the wound but the patient states he could not tolerate it. Currently they are using Xeroform ABDs and gauze. They have been followed extensively vein and vascular by Dr. dew service. The patient had an angiogram on 01/13/2018 which showed a normal CFA, PFA and stents in both iliac arteries were patent. BENNO, BRENSINGER ([id number]) These were placed in March 2019. The patient had an MRI of the stump with contrast in December 2019. This suggested inflammatory changes within the anterior soft tissue of the remaining lower leg status post below-knee amputation there was some suggestion of a sinus tract. Adjacent to the soft tissue change there was marrow T2 hyperdensity and low-level enhancement which could reflect osteomyelitis. At this point I am not sure what was done about this report I will need to look back through the records. Past medical history; type 2 diabetes with known PAD, paroxysmal A. fib on Eliquis, iron deficiency anemia, hyperlipidemia, left occipital CVA, right external iliac stent 3/19. 7/8; the patient apparently tolerated the Dakin's wet-to-dry twice daily until this morning and then they put Xeroform on it. Area I debrided last  week looks quite good. Electronic Signature(s) Signed: 02/08/2019 5:45:20 PM By: Linton Ham MD Entered By: Linton Ham on 02/08/2019 16:52:32 Deiss,  [name] ([id number]) -------------------------------------------------------------------------------- Physical Exam Details Patient Name: Glenn Standard B. Date of Service: 02/08/2019 3:15 PM Medical Record Number: [id number] Patient Account Number: [id number] Date of Birth/Sex: 1956/07/10 (63 y.o. M) Treating RN: Primary Care Provider: Park Liter Other Clinician: Referring Provider: Park Liter Treating Provider/Extender: Tito Dine in Treatment: 1 Constitutional Sitting or standing Blood Pressure is within target range for patient.. Pulse regular and within target range for patient.Marland Kitchen Respirations regular, non-labored and within target range.. Temperature is normal and within the target range for the patient.Marland Kitchen appears in no distress. Cardiovascular Popliteal pulses palpable on the right. Notes Wound exam; once again using a #5 curette I debrided up to about an additional 40% of the wound surface i.e. almost 50% of the total has been done in the last 2 weeks. Surface looks generally healthy. The patient complains of a lot of pain and discomfort it is not easy to get him through this. Hemostasis with direct pressure Electronic Signature(s) Signed: 02/08/2019 5:45:20 PM By: Linton Ham MD Entered By: Linton Ham on 02/08/2019 16:53:52 Smelser, [name] ([id number]) -------------------------------------------------------------------------------- Physician Orders Details Patient Name: [name] Date of Service: 02/08/2019 3:15 PM Medical Record Number: [id number] Patient Account Number: [id number] Date of Birth/Sex: 09/12/1955 (63 y.o. M) Treating RN: Cornell Barman Primary Care Provider: Park Liter Other Clinician: Referring Provider: Park Liter Treating Provider/Extender: Tito Dine in Treatment: 1 Verbal / Phone Orders: No Diagnosis Coding Wound Cleansing Wound #4 Right Amputation Site - Below Knee o Cleanse wound with mild soap and  water Anesthetic (add to Medication List) Wound #4 Right Amputation Site - Below Knee o Topical Lidocaine 4% cream applied to wound bed prior to debridement (In Clinic Only). Primary Wound Dressing Wound #4 Right Amputation Site - Below Knee o Other: - Dakins soaked gauze Secondary Dressing Wound #4 Right Amputation Site - Below Knee o ABD and Kerlix/Conform Dressing Change Frequency Wound #4 Right Amputation Site - Below Knee o Change dressing every day. Follow-up Appointments Wound #4 Right Amputation Site - Below Knee o Return Appointment in 1 week. Edema Control Wound #4 Right Amputation Site - Below Knee o Elevate legs to the level of the heart and pump ankles as often as possible Additional Orders / Instructions Wound #4 Right Amputation Site - Below Knee o Activity as tolerated Electronic Signature(s) Signed: 02/08/2019 5:45:20 PM By: Linton Ham MD Signed: 02/08/2019 5:54:22 PM By: Gretta Cool, BSN, RN, CWS, Kim RN, BSN Entered By: Gretta Cool, BSN, RN, CWS, Kim on 02/08/2019 16:09:24 Trembath, [name] ([id number]) -------------------------------------------------------------------------------- Problem List Details Patient Name: [name], [name] Date of Service: 02/08/2019 3:15 PM Medical Record Number: [id number] Patient Account Number: [id number] Date of Birth/Sex: [birth year] (63 y.o. M) Treating RN: Cornell Barman Primary Care Provider: Park Liter Other Clinician: Referring Provider: Park Liter Treating Provider/Extender: Tito Dine in Treatment: 1 Active Problems ICD-10 Evaluated Encounter Code Description Active Date Today Diagnosis E11.51 Type 2 diabetes mellitus with diabetic peripheral angiopathy 02/01/2019 No Yes without gangrene 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 02/01/2019 No Yes limited to breakdown of skin E11.622 Type 2 diabetes mellitus with other skin ulcer 02/01/2019 No Yes Z89.511 Acquired absence of  right leg below knee 02/01/2019 No Yes Inactive Problems Resolved Problems Electronic Signature(s) Signed: 02/08/2019 5:45:20 PM By: Linton Ham MD Entered By: Linton Ham on  02/08/2019 16:49:52 Jaskolski, Izick BMarland Kitchen ([id number]) -------------------------------------------------------------------------------- Progress Note Details Patient Name: [name], [name] Date of Service: 02/08/2019 3:15 PM Medical Record Number: [id number] Patient Account Number: [id number] Date of Birth/Sex: 12/14/1955 (63 y.o. M) Treating RN: Primary Care Provider: Park Liter Other Clinician: Referring Provider: Park Liter Treating Provider/Extender: Tito Dine in Treatment: 1 Subjective History of Present Illness (HPI) 03/03/18- He is here for initial evaluation of right toe wounds and tissue necrosis. He saw Dr. Vickki Muff on 7/23 where he was noted to have progressive ischemic changes to his toes. He was referred from Dr. Fellows office to the wound clinic for possible hyperbaric oxygen therapy. At this time, based on the information we have at hand, there is no diagnosis that would qualify him for hyperbaric oxygen therapy. He underwent an angiogram on 6/13 by Dr. 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 He did not have intervention on 6/13. arterial studies performed on 6/19 showed resting ABI to the 0.99 (right) and 1.09 (left). 03/10/18-He is seen in follow up evaluation. Plain film x-ray ordered last week revealed no evidence of acute osseous abnormality and dorsal soft  tissue swelling of the midfoot. Culture that was obtained last week from the right great toe was negative. He has an appointment with Dr Lucky Cowboy on 8/23. 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 The right great toe with a small, superficial, open area with resolution of erythema and improvement in edema; will continue with medihoney. He will follow up next week 03/17/18-He is seen in follow-up evaluation. He was able to reschedule his appointment with vascular medicine and was seen on 8/14. In office ABIs are similar to studies on 6/19, right toe waveform is blunted with known small vessel disease; they are recommending compression therapy which the patient is refusing to wear. 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 Vickki Muff. The patient's wife is going to try and move that appointment up to next week. The patient does exhibit right foot pain consistent with rest pain, relieved with dependent position. There is cyanotic discoloration to right toes 1-3 and along the lateral aspect and heel of the right foot. The wound to the dorsal aspect of the right great toe is larger, dry; we will initiate santyl. He can follow up in 1-2 weeks 04/15/18 on evaluation today patient's right foot ulcer in regard to the fourth and fifth toes really appears to be no better. In fact this is pretty much a complete region of gangrene that this point. He also has microvascular disease and the remaining toes and the discoloration noted today is extremely cyanotic blue/purple. He actually was supposed to have Artie had invitation but unfortunately this is been delayed because there is some question as to whether or not he may actually have a G.I. bleed due to being severely anemic. He's also apparently having some issues with his heart which is also delayed the process at this point. Unfortunately in general nothing seems to be improving if anything his wife tells me that  this seems to be getting worse. 05/09/18 on evaluation today patient actually appears to  be doing worse in regard to the gangrene of his right foot. This is actually spread to all toes and actually up a portion of his foot as well. He has been referred by podiatry to Dr. dew, vascular surgery, in order to evaluate for a below the knee amputation. Nonetheless the patient is having some discomfort but his biggest concern that he seems to be stuck on more than anything is how far up Dr. dew is going to have to take his leg off. This is a great concern for him. In fact he asked me the same question at least 4-5 times during evaluation today ADMISSION 02/01/2019 This is now a 63 year old man with type 2 diabetes and known PAD. 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 According to the history that his wife is giving [place] that the wound held together when the sutures were removed however gradually this opened and expanded. He was taken to the OR on 08/08/2018 for debridement. He had a wound VAC on this for several months which apparently resulted in good improvement with the wound but the patient states he could not tolerate it. Currently they are using Xeroform ABDs and gauze. They have been followed extensively vein and vascular by Dr. dew service. [name], [name] ([id number]) The patient had an angiogram on 01/13/2018 which showed a normal CFA, PFA and stents in both iliac arteries were patent. These were placed in March 2019. The patient had an MRI of the stump with contrast in December 2019. This suggested inflammatory changes within the anterior soft tissue of the remaining lower leg status post below-knee amputation there was some suggestion of a sinus tract. Adjacent to the soft tissue change there was marrow T2 hyperdensity and low-level enhancement which could reflect osteomyelitis. At this point I am not sure what was done about this report I will need to  look back through the records. Past medical history; type 2 diabetes with known PAD, paroxysmal A. fib on Eliquis, iron deficiency anemia, hyperlipidemia, left occipital CVA, right external iliac stent 3/19. 7/8; the patient apparently tolerated the Dakin's wet-to-dry twice daily until this morning and then they put Xeroform on it. Area I debrided last week looks quite good. Objective Constitutional Sitting or standing Blood Pressure is within target range for patient.. Pulse regular and within target range for patient.Marland Kitchen Respirations regular, non-labored and within target range.. Temperature is normal and within the target range for the patient.Marland Kitchen appears in no distress. Vitals Time Taken: 3:48 PM, Height: 70 in, Weight: 175 lbs, BMI: 25.1, Temperature: 98.8 F, Pulse: 88 bpm, Respiratory Rate: 16 breaths/min, Blood Pressure: 102/69 mmHg. Cardiovascular Popliteal pulses palpable on the right. General Notes: Wound exam; once again using a #5 curette I debrided up to about an additional 40% of the wound surface i.e. almost 50% of the total has been done in the last 2 weeks. Surface looks generally healthy. The patient complains of a lot of pain and discomfort it is not easy to get him through this. Hemostasis with direct pressure Integumentary (Hair, Skin) Wound #4 status is Open. Original cause of wound was Surgical Injury. The wound is located on the Right Amputation Site - Below Knee. The wound measures 13cm length x 12cm width x 0.2cm depth; 122.522cm^2 area and 24.504cm^3 volume. There is Fat Layer (Subcutaneous Tissue) Exposed exposed. There is no tunneling or undermining noted. There is a large amount of serous drainage noted. The wound margin is flat and  intact. There is small (1-33%) pink granulation within the wound bed. There is a large (67-100%) amount of necrotic tissue within the wound bed including Adherent Slough. Assessment Active Problems ICD-10 Type 2 diabetes mellitus  with diabetic peripheral angiopathy without gangrene Non-pressure chronic ulcer of other part of right lower leg limited to breakdown of skin Type 2 diabetes mellitus with other skin ulcer Acquired absence of right leg below knee [name], [name] ([id number]) Procedures Wound #4 Pre-procedure diagnosis of Wound #4 is a Diabetic Wound/Ulcer of the Lower Extremity located on the Right Amputation Site - Below Knee .Severity of Tissue Pre Debridement is: Fat layer exposed. There was a Excisional Skin/Subcutaneous Tissue Debridement with a total area of 24 sq cm performed by Ricard Dillon, MD. With the following instrument(s): Curette to remove Viable and Non-Viable tissue/material. Material removed includes Subcutaneous Tissue and Slough and after achieving pain control using Lidocaine. No specimens were taken. A time out was conducted at 16:02, prior to the start of the procedure. A Moderate amount of bleeding was controlled with Pressure. The procedure was tolerated well. Post Debridement Measurements: 13cm length x 12cm width x 2cm depth; 245.044cm^3 volume. Character of Wound/Ulcer Post Debridement requires further debridement. Severity of Tissue Post Debridement is: Fat layer exposed. 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 Primary Wound Dressing: Wound #4 Right Amputation Site - Below Knee: Other: - Dakins soaked gauze Secondary Dressing: Wound #4 Right Amputation Site - Below Knee: ABD and Kerlix/Conform Dressing Change Frequency: Wound #4 Right Amputation Site - Below Knee: Change dressing every day. Follow-up Appointments: Wound #4 Right Amputation Site - Below Knee: Return Appointment  in 1 week. 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 Continue Dakin's wet-to-dry 2. The surface debrided in the first 2 visits. 3. Very large surface area. There will not be a lot of options for topical dressings. [name], [name] ([id number]) 4. The patient is very reluctant to consider any stump revision surgery 5. It also came out that the patient's wife is been diluting the Dakin's roughly 1:2 with distilled water. She was cautioned not to do this Electronic Signature(s) Signed: 02/08/2019 5:45:20 PM By: Linton Ham MD Entered By: Linton Ham on 02/08/2019 16:56:20 [name], [name] ([id number]) -------------------------------------------------------------------------------- SuperBill Details Patient Name: Glenn Standard B. Date of Service: 02/08/2019 Medical Record Number: [id number] Patient Account Number: [id number] Date of Birth/Sex: 1956/02/27 (63 y.o. M) Treating RN: Primary Care Provider: Park Liter Other Clinician: Referring Provider: Park Liter Treating Provider/Extender: Tito Dine in Treatment: 1 Diagnosis Coding ICD-10 Codes Code Description E11.51 Type 2 diabetes mellitus with diabetic peripheral angiopathy without gangrene 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 limited to breakdown of skin E11.622 Type 2 diabetes mellitus with other skin ulcer Z89.511 Acquired absence of right leg below knee Facility Procedures CPT4 Code Description: 19379024 11042 - DEB SUBQ TISSUE 20 SQ CM/< ICD-10 Diagnosis Description 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 limi Modifier: ted to breakdow Quantity: 1 n of skin  CPT4 Code Description: [id number] 11045 - DEB SUBQ TISS EA ADDL 20CM ICD-10 Diagnosis  Description 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 limi Modifier: ted to breakdow Quantity: 1 n of skin Physician Procedures CPT4 Code Description: 2840698 11042 - WC PHYS SUBQ TISS 20 SQ CM ICD-10 Diagnosis Description 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 limit Modifier: ed to breakdown Quantity: 1 of skin CPT4 Code Description: [id number] 11045 - WC PHYS SUBQ TISS EA ADDL 20 CM ICD-10 Diagnosis Description 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 limit Modifier: ed to breakdown Quantity: 1 of skin Electronic Signature(s) Signed: 02/08/2019 5:45:20 PM By: Linton Ham MD Entered By: Linton Ham on 02/08/2019 16:56:47

## 2019-02-09 NOTE — Progress Notes (Signed)
[name], [name] ([id number]) Visit Report for 02/08/2019 Arrival Information Details Patient Name: [name]. Date of Service: 02/08/2019 3:15 PM Medical Record Number: [id number] Patient Account Number: [id number] Date of Birth/Sex: 29-May-1956 (63 y.o. M) Treating RN: Cornell Barman Primary Care Freddi Schrager: Park Liter Other Clinician: Referring Ronia Hazelett: Park Liter Treating Ettore Trebilcock/Extender: Tito Dine in Treatment: 1 Visit Information History Since Last Visit Added or deleted any medications: No Patient Arrived: Wheel Chair Any new allergies or adverse reactions: No Arrival Time: 15:45 Had a fall or experienced change in No Accompanied By: wife activities of daily living that may affect Transfer Assistance: Manual risk of falls: Patient Identification Verified: Yes Signs or symptoms of abuse/neglect since last visito No Secondary Verification Process Yes Hospitalized since last visit: No Completed: Implantable device outside of the clinic excluding No Patient Has Alerts: Yes cellular tissue based products placed in the center Patient Alerts: Patient on Blood since last visit: Thinner Has Dressing in Place as Prescribed: Yes DMII Pain Present Now: Yes Eliquis Electronic Signature(s) Signed: 02/08/2019 5:54:22 PM By: Gretta Cool, BSN, RN, CWS, Kim RN, BSN Entered By: Gretta Cool, BSN, RN, CWS, Kim on 02/08/2019 15:46:51 [name] ([id number]) -------------------------------------------------------------------------------- Encounter Discharge Information Details Patient Name: Glenn Standard B. Date of Service: 02/08/2019 3:15 PM Medical Record Number: [id number] Patient Account Number: [id number] Date of Birth/Sex: 1955/11/05 (63 y.o. M) Treating RN: Army Melia Primary Care Eluterio Seymour: Park Liter Other Clinician: Referring Haeli Gerlich: Park Liter Treating Viveka Wilmeth/Extender: Tito Dine in Treatment: 1 Encounter Discharge Information Items  Post Procedure Vitals Discharge Condition: Stable Temperature (F): 98.8 Ambulatory Status: Wheelchair Pulse (bpm): 88 Discharge Destination: Home Respiratory Rate (breaths/min): 16 Transportation: Other Blood Pressure (mmHg): 118/69 Accompanied By: wife Schedule Follow-up Appointment: Yes Clinical Summary of Care: Electronic Signature(s) Signed: 02/09/2019 2:48:20 PM By: Army Melia Entered By: Army Melia on 02/08/2019 16:22:17 [name] ([id number]) -------------------------------------------------------------------------------- Lower Extremity Assessment Details Patient Name: Glenn Standard B. Date of Service: 02/08/2019 3:15 PM Medical Record Number: [id number] Patient Account Number: [id number] Date of Birth/Sex: 1956/07/28 (63 y.o. M) Treating RN: Cornell Barman Primary Care Zoelle Markus: Park Liter Other Clinician: Referring Macaulay Reicher: Park Liter Treating Darrick Greenlaw/Extender: Tito Dine in Treatment: 1 Vascular Assessment Pulses: Popliteal Palpable: [Right:Yes] Electronic Signature(s) Signed: 02/08/2019 5:54:22 PM By: Gretta Cool, BSN, RN, CWS, Kim RN, BSN Entered By: Gretta Cool, BSN, RN, CWS, Kim on 02/08/2019 15:58:35 [name] ([id number]) -------------------------------------------------------------------------------- Multi Wound Chart Details Patient Name: Glenn Standard B. Date of Service: 02/08/2019 3:15 PM Medical Record Number: [id number] Patient Account Number: [id number] Date of Birth/Sex: Oct 09, 1955 (63 y.o. 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 N/A N/A Comorbid History: Arrhythmia, Hypertension, N/A N/A Peripheral Arterial Disease, Type II Diabetes, Dementia Date Acquired: 01/31/2018 N/A N/A Weeks of Treatment: 1 N/A N/A Wound Status: Open N/A N/A Pending Amputation on Yes N/A N/A Presentation: Measurements L x W x D 13x12x0.2 N/A N/A (cm) Area (cm) : 122.522 N/A N/A Volume (cm) : 24.504 N/A N/A % Reduction in Area: 17.20% N/A N/A % Reduction in Volume: -65.50% N/A N/A Classification: Grade 2 N/A  N/A Exudate Amount: Large N/A N/A Exudate Type: Serous N/A N/A Exudate Color: amber N/A N/A Wound Margin: Flat and Intact N/A N/A Granulation Amount: Small (1-33%) N/A N/A Granulation Quality: Pink N/A N/A Necrotic Amount: Large (67-100%) N/A N/A Exposed Structures: N/A N/A [name], [name] ([id number]) Fat Layer (Subcutaneous Tissue) Exposed: Yes Fascia: No Tendon: No Muscle: No Joint: No Bone: No Epithelialization: None N/A N/A Debridement: Debridement - Excisional N/A N/A Pre-procedure 16:02 N/A N/A Verification/Time Out Taken: Pain Control: Lidocaine N/A N/A Tissue Debrided: Subcutaneous, Slough N/A N/A Level: Skin/Subcutaneous Tissue N/A N/A Debridement Area (sq cm): 24 N/A N/A Instrument: Curette N/A N/A Bleeding: Moderate N/A N/A Hemostasis Achieved: Pressure N/A N/A Debridement Treatment Procedure was tolerated well N/A N/A Response: Post Debridement 13x12x2 N/A N/A Measurements L x W x D (cm) Post Debridement Volume: 245.044 N/A N/A (cm) Procedures Performed: Debridement N/A N/A Treatment Notes Wound #4 (Right Amputation Site - Below Knee) Notes Dakins soaked gauze, abd, kerlix secured with tape Electronic Signature(s) Signed: 02/08/2019 5:45:20 PM By: Linton Ham MD Entered By: Linton Ham on 02/08/2019  16:50:21 [name] ([id number]) -------------------------------------------------------------------------------- Multi-Disciplinary Care Plan Details Patient Name: Glenn Standard B. Date of Service: 02/08/2019 3:15 PM Medical Record Number: [id number] Patient Account Number: [id number] Date of Birth/Sex: Sep 29, 1955 (63 y.o. M) Treating RN: Cornell Barman Primary Care Emylie Amster: Park Liter Other Clinician: Referring Aily Tzeng: Park Liter Treating Delpha Perko/Extender: Tito Dine in Treatment: 1 Active Inactive Necrotic Tissue Nursing Diagnoses: Impaired tissue integrity related to necrotic/devitalized tissue Goals: Necrotic/devitalized tissue will be minimized in the wound bed Date Initiated: 02/01/2019 Target Resolution Date: 02/15/2019 Goal Status: Active Interventions: Assess patient pain level pre-, during and post procedure and prior to discharge Treatment Activities: Apply topical anesthetic as ordered : 02/01/2019 Notes: Orientation to the Wound Care Program Nursing Diagnoses: Knowledge deficit related to the wound healing center program Goals: Patient/caregiver will verbalize understanding of the [place] Date Initiated: 02/01/2019 Target Resolution Date: 02/15/2019 Goal Status: Active Interventions: Provide education on orientation to the wound center Notes: Pain, Acute or Chronic Nursing Diagnoses: Pain Management - Non-cyclic Chronic Pain Pain, acute or chronic: actual or potential Goals: Patient will verbalize adequate pain control and receive pain control interventions during procedures as needed Date Initiated: 02/01/2019 Target Resolution Date: 02/08/2019 [name], [name] ([id number]) Goal Status: Active Interventions: Complete pain assessment as per visit requirements Treatment Activities: Administer pain control measures as ordered : 02/01/2019 Notes: Wound/Skin Impairment Nursing Diagnoses: Knowledge deficit related to  smoking impact on wound healing Goals: Patient/caregiver will verbalize understanding of skin care regimen Date Initiated: 02/01/2019 Target Resolution Date: 02/08/2019 Goal Status: Active Ulcer/skin breakdown will have a volume reduction of 30% by week 4 Date Initiated: 02/01/2019 Target Resolution Date: 03/08/2019 Goal Status: Active Interventions: Assess ulceration(s) every visit Treatment Activities: Referred to DME Nakaila Freeze for dressing supplies : 02/01/2019 Skin care regimen initiated : 02/01/2019 Notes: Electronic Signature(s) Signed: 02/08/2019 5:54:22 PM By: Gretta Cool, BSN, RN, CWS, Kim RN, BSN Entered By: Gretta Cool, BSN, RN, CWS, Kim on 02/08/2019 16:07:06 Rode, [name] ([id number]) -------------------------------------------------------------------------------- Pain Assessment Details Patient Name: Glenn Standard B. Date of Service: 02/08/2019 3:15 PM Medical Record Number: [id number] Patient Account Number: [id number] Date of Birth/Sex: August 12, 1955 (63 y.o. M) Treating RN: Cornell Barman Primary Care Messiyah Waterson: Park Liter Other Clinician: Referring Skylinn Vialpando: Park Liter Treating Annastyn Silvey/Extender: Tito Dine in Treatment: 1 Active Problems Location of Pain Severity and Description of Pain Patient Has Paino Yes Site Locations Pain Location: Pain in Ulcers  Rate the pain. Current Pain Level: 5 Pain Management and Medication Current Pain Management: Electronic Signature(s) Signed: 02/08/2019 5:54:22 PM By: Gretta Cool, BSN, RN, CWS, Kim RN, BSN Entered By: Gretta Cool, BSN, RN, CWS, Kim on 02/08/2019 15:48:30 [place], [name] ([id number]) -------------------------------------------------------------------------------- Patient/Caregiver Education Details Patient Name: [name] Date of Service: 02/08/2019 3:15 PM Medical Record Number: [id number] Patient Account Number: [id number] Date of Birth/Gender: 1955-09-22 (63 y.o. M) Treating RN: Cornell Barman Primary Care Physician:  Park Liter Other Clinician: Referring Physician: Park Liter Treating Physician/Extender: Tito Dine in Treatment: 1 Education Assessment Education Provided To: Patient Education Topics Provided Wound/Skin Impairment: Handouts: Caring for Your Ulcer Methods: Demonstration, Explain/Verbal Responses: State content correctly Electronic Signature(s) Signed: 02/08/2019 5:54:22 PM By: Gretta Cool, BSN, RN, CWS, Kim RN, BSN Entered By: Gretta Cool, BSN, RN, CWS, Kim on 02/08/2019 16:10:07 [place], [name] ([id number]) -------------------------------------------------------------------------------- Wound Assessment Details Patient Name: Glenn Standard B. Date of Service: 02/08/2019 3:15 PM Medical Record Number: [id number] Patient Account Number: [id number] Date of Birth/Sex: 04/22/1956 (63 y.o. 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 Wounding Event: Surgical Injury Etiology: Date Acquired: 01/31/2018 Wound Status: Open Weeks Of Treatment: 1 Comorbid Arrhythmia, Hypertension, Peripheral Arterial Clustered Wound: No History: Disease, Type II Diabetes, Dementia Pending Amputation On Presentation Photos Wound Measurements Length: (cm) 13 Width: (cm) 12 Depth: (cm) 0.2 Area: (cm) 122.522 Volume: (cm) 24.504 % Reduction in Area: 17.2% % Reduction in Volume: -65.5% Epithelialization: None Tunneling: No Undermining: No Wound Description Classification: Grade 2 Wound Margin: Flat and Intact Exudate Amount: Large Exudate Type: Serous Exudate Color: amber Foul Odor After Cleansing: No Slough/Fibrino Yes Wound Bed Granulation Amount: Small (1-33%)  Exposed Structure Granulation Quality: Pink Fascia Exposed: No Necrotic Amount: Large (67-100%) Fat Layer (Subcutaneous Tissue) Exposed: Yes Necrotic Quality: Adherent Slough Tendon Exposed: No Muscle Exposed: No Joint Exposed: No Bone Exposed: No Treatment Notes Heckman, Donelle B. ([id number]) Wound #4 (Right Amputation Site - Below Knee) Notes Dakins soaked gauze, abd, kerlix secured with tape Electronic Signature(s) Signed: 02/08/2019 5:54:22 PM By: Gretta Cool, BSN, RN, CWS, Kim RN, BSN Entered By: Gretta Cool, BSN, RN, CWS, Kim on 02/08/2019 15:58:08 [place], [name] ([id number]) -------------------------------------------------------------------------------- Vitals Details Patient Name: Glenn Standard B. Date of Service: 02/08/2019 3:15 PM Medical Record Number: [id number] Patient Account Number: [id number] Date of Birth/Sex: April 02, 1956 (63 y.o. M) Treating RN: Cornell Barman Primary Care Jashiya Bassett: Park Liter Other Clinician: Referring Kimo Bancroft: Park Liter Treating Nijah Tejera/Extender: Tito Dine in Treatment: 1 Vital Signs Time Taken: 15:48 Temperature (F): 98.8 Height (in): 70 Pulse (bpm): 88 Weight (lbs): 175 Respiratory Rate (breaths/min): 16 Body Mass Index (BMI): 25.1 Blood Pressure (mmHg): 102/69 Reference Range: 80 - 120 mg / dl Electronic Signature(s) Signed: 02/08/2019 5:54:22 PM By: Gretta Cool, BSN, RN, CWS, Kim RN, BSN Entered By: Gretta Cool, BSN, RN, CWS, Kim on 02/08/2019 15:49:38

## 2019-02-13 DIAGNOSIS — Z89511 Acquired absence of right leg below knee: Secondary | ICD-10-CM | POA: Diagnosis not present

## 2019-02-13 DIAGNOSIS — S98921A Partial traumatic amputation of right foot, level unspecified, initial encounter: Secondary | ICD-10-CM | POA: Diagnosis not present

## 2019-02-15 ENCOUNTER — Other Ambulatory Visit: Payer: Self-pay

## 2019-02-15 ENCOUNTER — Encounter: Payer: Medicare HMO | Admitting: Internal Medicine

## 2019-02-15 DIAGNOSIS — L97812 Non-pressure chronic ulcer of other part of right lower leg with fat layer exposed: Secondary | ICD-10-CM | POA: Diagnosis not present

## 2019-02-15 DIAGNOSIS — Z89511 Acquired absence of right leg below knee: Secondary | ICD-10-CM | POA: Diagnosis not present

## 2019-02-15 DIAGNOSIS — Z7901 Long term (current) use of anticoagulants: Secondary | ICD-10-CM | POA: Diagnosis not present

## 2019-02-15 DIAGNOSIS — L97811 Non-pressure chronic ulcer of other part of right lower leg limited to breakdown of skin: Secondary | ICD-10-CM | POA: Diagnosis not present

## 2019-02-15 DIAGNOSIS — E11621 Type 2 diabetes mellitus with foot ulcer: Secondary | ICD-10-CM | POA: Diagnosis not present

## 2019-02-15 DIAGNOSIS — E1151 Type 2 diabetes mellitus with diabetic peripheral angiopathy without gangrene: Secondary | ICD-10-CM | POA: Diagnosis not present

## 2019-02-15 DIAGNOSIS — T8789 Other complications of amputation stump: Secondary | ICD-10-CM | POA: Diagnosis not present

## 2019-02-15 DIAGNOSIS — D509 Iron deficiency anemia, unspecified: Secondary | ICD-10-CM | POA: Diagnosis not present

## 2019-02-15 DIAGNOSIS — I480 Paroxysmal atrial fibrillation: Secondary | ICD-10-CM | POA: Diagnosis not present

## 2019-02-16 NOTE — Progress Notes (Signed)
[name] ([id number]) Visit Report for 02/15/2019 Arrival Information Details Patient Name: [name], [name]. Date of Service: 02/15/2019 2:00 PM Medical Record Number: [id number] Patient Account Number: [id number] Date of Birth/Sex: 02-15-1956 (63 y.o. M) Treating RN: Army Melia Primary Care Roderic Lammert: Park Liter Other Clinician: Referring Laroy Mustard: Park Liter Treating Zamaria Brazzle/Extender: Tito Dine in Treatment: 2 Visit Information History Since Last Visit Added or deleted any medications: No Patient Arrived: Wheel Chair Any new allergies or adverse reactions: No Arrival Time: 13:53 Had a fall or experienced change in No Accompanied By: self activities of daily living that may affect Transfer Assistance: None risk of falls: Patient Has Alerts: Yes Signs or symptoms of abuse/neglect since last visito No Patient Alerts: Patient on Blood Thinner Hospitalized since last visit: No DMII Has Dressing in Place as Prescribed: Yes Eliquis Pain Present Now: No Electronic Signature(s) Signed: 02/15/2019 3:27:20 PM By: Army Melia Entered By: Army Melia on 02/15/2019 13:54:13 Jago, Louie [place] ([id number]) -------------------------------------------------------------------------------- Encounter Discharge Information Details Patient Name: Glenn Standard B. Date of Service: 02/15/2019 2:00 PM Medical Record Number: [id number] Patient Account Number: [id number] Date of Birth/Sex: 12/26/1955 (63 y.o. M) Treating RN: Army Melia Primary Care Ma Munoz: Park Liter Other Clinician: Referring Cartel Mauss: Park Liter Treating Avari Nevares/Extender: Tito Dine in Treatment: 2 Encounter Discharge Information Items Post Procedure Vitals Discharge Condition: Stable Temperature (F): 98.7 Ambulatory Status: Wheelchair Pulse (bpm): 64 Discharge Destination: Home Respiratory Rate (breaths/min): 16 Transportation: Private Auto Blood Pressure (mmHg):  105/59 Accompanied By: wife Schedule Follow-up Appointment: Yes Clinical Summary of Care: Electronic Signature(s) Signed: 02/15/2019 3:27:20 PM By: Army Melia Entered By: Army Melia on 02/15/2019 14:56:02 Versteeg, Louie [place] ([id number]) -------------------------------------------------------------------------------- Lower Extremity Assessment Details Patient Name: Glenn Standard B. Date of Service: 02/15/2019 2:00 PM Medical Record Number: [id number] Patient Account Number: [id number] Date of Birth/Sex: 13-Dec-1955 (63 y.o. M) Treating RN: Army Melia Primary Care Ardith Lewman: Park Liter Other Clinician: Referring Addaleigh Nicholls: Park Liter Treating Amaira Safley/Extender: Ricard Dillon Weeks in Treatment: 2 Edema Assessment Assessed: [Left: No] [Right: No] Edema: [Left: N] [Right: o] Vascular Assessment Pulses: Dorsalis Pedis Palpable: [Right:No] Electronic Signature(s) Signed: 02/15/2019 3:27:20 PM By: Army Melia Entered By: Army Melia on 02/15/2019 14:00:26 Roat, Louie [place] ([id number]) -------------------------------------------------------------------------------- Multi Wound Chart Details Patient Name: Glenn Standard B. Date of Service: 02/15/2019 2:00 PM Medical Record Number: [id number] Patient Account Number: [id number] Date of Birth/Sex: 06-05-1956 (63 y.o. 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 N/A  N/A Comorbid History: Arrhythmia, Hypertension, N/A N/A Peripheral Arterial Disease, Type II Diabetes, Dementia Date Acquired: 01/31/2018 N/A N/A Weeks of Treatment: 2 N/A N/A Wound Status: Open N/A N/A Pending Amputation on Yes N/A N/A Presentation: Measurements L x W x D 12.3x12.1x0.2 N/A N/A (cm) Area (cm) : 379.024 N/A N/A Volume (cm) : 23.378 N/A N/A % Reduction in Area: 21.00% N/A N/A % Reduction in Volume: -57.90% N/A N/A Classification: Grade 2 N/A N/A Exudate Amount: Large N/A N/A Exudate Type: Serous N/A N/A Exudate Color: amber N/A N/A Wound Margin: Flat and Intact N/A N/A Granulation Amount: Small (1-33%) N/A N/A Granulation  Quality: Pink N/A N/A Necrotic Amount: Large (67-100%) N/A N/A Exposed Structures: N/A N/A TARRELL, DEBES B. ([id number]) Fat Layer (Subcutaneous Tissue) Exposed: Yes Fascia: No Tendon: No Muscle: No Joint: No Bone: No Epithelialization: None N/A N/A Debridement: Debridement - Excisional N/A N/A Pre-procedure 14:37 N/A N/A Verification/Time Out Taken: Pain Control: Lidocaine N/A N/A Tissue Debrided: Subcutaneous, Slough N/A N/A Level: Skin/Subcutaneous Tissue N/A N/A Debridement Area (sq cm): 148.83 N/A N/A Instrument: Curette N/A N/A Bleeding: Minimum N/A N/A Hemostasis Achieved: Pressure N/A N/A Debridement Treatment Procedure was tolerated well N/A N/A Response: Post Debridement 12.3x12.1x0.3 N/A N/A Measurements L x W x D (cm) Post Debridement Volume: 35.067 N/A N/A (cm) Procedures Performed: Debridement N/A N/A Treatment Notes Wound #4 (Right Amputation Site - Below Knee) Notes Dakins soaked gauze, abd, kerlix secured with tape Electronic Signature(s) Signed: 02/15/2019 4:31:45 PM By: Linton Ham MD Entered By: Linton Ham on 02/15/2019 14:57:13 Baiza, Louie [place] ([id number]) -------------------------------------------------------------------------------- [place] Details Patient Name:  Glenn Standard B. Date of Service: 02/15/2019 2:00 PM Medical Record Number: [id number] Patient Account Number: [id number] Date of Birth/Sex: 24-Aug-1955 (63 y.o. M) Treating RN: Cornell Barman Primary Care Mar Walmer: Park Liter Other Clinician: Referring Isolde Skaff: Park Liter Treating Hester Forget/Extender: Tito Dine in Treatment: 2 Active Inactive Necrotic Tissue Nursing Diagnoses: Impaired tissue integrity related to necrotic/devitalized tissue Goals: Necrotic/devitalized tissue will be minimized in the wound bed Date Initiated: 02/01/2019 Target Resolution Date: 02/15/2019 Goal Status: Active Interventions: Assess patient pain level pre-, during and post procedure and prior to discharge Treatment Activities: Apply topical anesthetic as ordered : 02/01/2019 Notes: Orientation to the Wound Care Program Nursing Diagnoses: Knowledge deficit related to the wound healing center program Goals: Patient/caregiver will verbalize understanding of the [place] Date Initiated: 02/01/2019 Target Resolution Date: 02/15/2019 Goal Status: Active Interventions: Provide education on orientation to the wound center Notes: Pain, Acute or Chronic Nursing Diagnoses: Pain Management - Non-cyclic Chronic Pain Pain, acute or chronic: actual or potential Goals: Patient will verbalize adequate pain control and receive pain control interventions during procedures as needed Date Initiated: 02/01/2019 Target Resolution Date: 02/08/2019 ALDRIN, ENGELHARD ([id number]) Goal Status: Active Interventions: Complete pain assessment as per visit requirements Treatment Activities: Administer pain control measures as ordered : 02/01/2019 Notes: Wound/Skin Impairment Nursing Diagnoses: Knowledge deficit related to smoking impact on wound healing Goals: Patient/caregiver will verbalize understanding of skin care regimen Date Initiated: 02/01/2019 Target Resolution Date: 02/08/2019 Goal  Status: Active Ulcer/skin breakdown will have a volume reduction of 30% by week 4 Date Initiated: 02/01/2019 Target Resolution Date: 03/08/2019 Goal Status: Active Interventions: Assess ulceration(s) every visit Treatment Activities: Referred to DME Hiro Vipond for dressing supplies : 02/01/2019 Skin care regimen initiated : 02/01/2019 Notes: Electronic Signature(s) Signed: 02/15/2019 4:20:22 PM By: Gretta Cool, BSN, RN, CWS, Kim RN, BSN Entered By: Gretta Cool, BSN, RN, CWS, Kim on 02/15/2019 14:36:01 Franchino, Louie [place] ([id number]) -------------------------------------------------------------------------------- Pain Assessment Details Patient Name: Glenn Standard B. Date of Service: 02/15/2019 2:00 PM Medical Record Number: [id number] Patient Account Number: [id number] Date of Birth/Sex: 14-Jul-1956 (63 y.o. M) Treating RN: Army Melia Primary Care Kathlyn Leachman: Park Liter Other Clinician: Referring Thurlow Gallaga: Park Liter Treating Trung Wenzl/Extender: Tito Dine in Treatment: 2 Active Problems Location of Pain Severity and Description of Pain Patient Has Paino Yes Site Locations Pain Location: Pain in Ulcers Rate the pain. Current Pain Level: 5 Pain Management and Medication Current Pain Management: Electronic Signature(s) Signed: 02/15/2019 3:27:20 PM By: Army Melia Entered By: Army Melia on 02/15/2019 13:54:39  ADALBERTO, METZGAR ([id number]) -------------------------------------------------------------------------------- Patient/Caregiver Education Details Patient Name: TUNIS, GENTLE B. Date of Service: 02/15/2019 2:00 PM Medical Record Number: [id number] Patient Account Number: [id number] Date of Birth/Gender: Sep 25, 1955 (63 y.o. M) Treating RN: Cornell Barman Primary Care Physician: Park Liter Other Clinician: Referring Physician: Park Liter Treating Physician/Extender: Tito Dine in Treatment: 2 Education Assessment Education Provided To: Patient Education  Topics Provided Pain: Handouts: A Guide to Pain Control Methods: Demonstration, Explain/Verbal Responses: State content correctly Wound Debridement: Handouts: Wound Debridement Methods: Demonstration, Explain/Verbal Responses: State content correctly Wound/Skin Impairment: Handouts: Caring for Your Ulcer Methods: Demonstration, Explain/Verbal Responses: State content correctly Electronic Signature(s) Signed: 02/15/2019 4:20:22 PM By: Gretta Cool, BSN, RN, CWS, Kim RN, BSN Entered By: Gretta Cool, BSN, RN, CWS, Kim on 02/15/2019 14:44:49 Latimore, Louie [place] ([id number]) -------------------------------------------------------------------------------- Wound Assessment Details Patient Name: Glenn Standard B. Date of Service: 02/15/2019 2:00 PM Medical Record Number: [id number] Patient Account Number: [id number] Date of Birth/Sex: 06-13-1956 (63 y.o. 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 Wounding Event: Surgical Injury Etiology: Date Acquired: 01/31/2018 Wound Status: Open Weeks Of Treatment: 2 Comorbid Arrhythmia, Hypertension, Peripheral Arterial Clustered Wound: No History: Disease, Type II Diabetes, Dementia Pending Amputation On Presentation Photos Wound Measurements Length: (cm) 12.3 Width: (cm) 12.1 Depth: (cm) 0.2 Area: (cm) 116.891 Volume: (cm) 23.378 % Reduction in Area: 21% % Reduction in Volume: -57.9% Epithelialization: None Tunneling: No Undermining: No Wound Description Classification: Grade 2 Wound Margin: Flat and Intact Exudate Amount: Large Exudate Type: Serous Exudate Color: amber Foul Odor After Cleansing: No Slough/Fibrino  Yes Wound Bed Granulation Amount: Small (1-33%) Exposed Structure Granulation Quality: Pink Fascia Exposed: No Necrotic Amount: Large (67-100%) Fat Layer (Subcutaneous Tissue) Exposed: Yes Necrotic Quality: Adherent Slough Tendon Exposed: No Muscle Exposed: No Joint Exposed: No Bone Exposed: No Treatment Notes Rapaport, Zaevion B. ([id number]) Wound #4 (Right Amputation Site - Below Knee) Notes Dakins soaked gauze, abd, kerlix secured with tape Electronic Signature(s) Signed: 02/15/2019 3:27:20 PM By: Army Melia Entered By: Army Melia on 02/15/2019 13:58:57 Kulick, Louie [place] ([id number]) -------------------------------------------------------------------------------- Vitals Details Patient Name: Glenn Standard B. Date of Service: 02/15/2019 2:00 PM Medical Record Number: [id number] Patient Account Number: [id number] Date of Birth/Sex: Nov 05, 1955 (63 y.o. M) Treating RN: Army Melia Primary Care Trinitee Horgan: Park Liter Other Clinician: Referring Stephenia Vogan: Park Liter Treating Ege Muckey/Extender: Tito Dine in Treatment: 2 Vital Signs Time Taken: 13:54 Temperature (F): 98.7 Height (in): 70 Pulse (bpm): 64 Weight (lbs): 175 Respiratory Rate (breaths/min): 16 Body Mass Index (BMI): 25.1 Blood Pressure (mmHg): 105/59 Reference Range: 80 - 120 mg / dl Electronic Signature(s) Signed: 02/15/2019 3:27:20 PM By: Army Melia Entered By: Army Melia on 02/15/2019 13:54:55

## 2019-02-16 NOTE — Progress Notes (Signed)
ACEYN, KATHOL ([id number]) Visit Report for 02/15/2019 Debridement Details Patient Name: [name], [name]. Date of Service: 02/15/2019 2:00 PM Medical Record Number: [id number] Patient Account Number: [id number] Date of Birth/Sex: 07/06/1956 (63 y.o. M) Treating RN: Cornell Barman Primary Care Provider: Park Liter Other Clinician: Referring Provider: Park Liter Treating Provider/Extender: Tito Dine in Treatment: 2 Debridement Performed for Wound #4 Right Amputation Site - Below Knee Assessment: Performed By: Physician Ricard Dillon, MD Debridement Type: Debridement Severity of Tissue Pre Fat layer exposed Debridement: Level of Consciousness (Pre- Awake and Alert procedure): Pre-procedure Verification/Time Yes - 14:37 Out Taken: Start Time: 14:37 Pain Control: Lidocaine Total Area Debrided (L x W): 12.3 (cm) x 12.1 (cm) = 148.83 (cm) Tissue and other material Viable, Non-Viable, Slough, Subcutaneous, Slough debrided: Level: Skin/Subcutaneous Tissue Debridement Description: Excisional Instrument: Curette Bleeding: Minimum Hemostasis Achieved: Pressure End Time: 14:42 Response to Treatment: Procedure was tolerated well Level of Consciousness Awake and Alert (Post-procedure): Post Debridement Measurements of Total Wound Length: (cm) 12.3 Width: (cm) 12.1 Depth: (cm) 0.3 Volume: (cm) 35.067 Character of Wound/Ulcer Post Debridement: Stable Severity of Tissue Post Debridement: Fat layer exposed Post Procedure Diagnosis Same as Pre-procedure Electronic Signature(s) Signed: 02/15/2019 4:20:22 PM By: Gretta Cool, BSN, RN, CWS, Kim RN, BSN Signed: 02/15/2019 4:31:45 PM By: Linton Ham MD Entered By: Linton Ham on 02/15/2019 14:57:37 Schwake, Yosgar B. ([id number]) -------------------------------------------------------------------------------- HPI Details Patient Name: Glenn Standard B. Date of Service: 02/15/2019 2:00 PM Medical Record Number:  [id number] Patient Account Number: [id number] Date of Birth/Sex: [birth year] (63 y.o. M) Treating RN: Cornell Barman Primary Care Provider: Park Liter Other Clinician: Referring Provider: Park Liter Treating Provider/Extender: Tito Dine in Treatment: 2 History of Present Illness HPI Description: 03/03/18- He is here for initial evaluation of right toe wounds and tissue necrosis. He saw Dr. Vickki Muff on 7/23 where he was noted to have progressive ischemic changes to his toes. He was referred from Dr. Fellows office to the wound clinic for possible hyperbaric oxygen therapy. At this time, based on the information we have at hand, there is no diagnosis that would qualify him for hyperbaric oxygen therapy. He underwent an angiogram on 6/13 by Dr. 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 He did not have intervention on 6/13. arterial studies performed on 6/19 showed resting ABI to the 0.99 (right) and 1.09 (left). 03/10/18-He is seen in follow up evaluation. Plain film x-ray ordered last week revealed no evidence of acute osseous abnormality and dorsal soft tissue swelling of the midfoot. Culture that was obtained last week from the right great toe was negative. He has an appointment with Dr Lucky Cowboy on 8/23. 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 The right great toe with a small, superficial, open area with resolution of erythema and improvement in edema; will continue with medihoney. He will follow up next week 03/17/18-He is seen in  follow-up evaluation. He was able to reschedule his appointment with vascular medicine and was seen on 8/14. In office ABIs are similar to studies on 6/19, right toe waveform is blunted with known small vessel disease; they are recommending compression therapy  which the patient is refusing to wear. 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 Vickki Muff. The patient's wife is going to try and move that appointment up to next week. The patient does exhibit right foot pain consistent with rest pain, relieved with dependent position. There is cyanotic discoloration to right toes 1-3 and along the lateral aspect and heel of the right foot. The wound to the dorsal aspect of the right great toe is larger, dry; we will initiate santyl. He can follow up in 1-2 weeks 04/15/18 on evaluation today patient's right foot ulcer in regard to the fourth and fifth toes really appears to be no better. In fact this is pretty much a complete region of gangrene that this point. He also has microvascular disease and the remaining toes and the discoloration noted today is extremely cyanotic blue/purple. He actually was supposed to have Artie had invitation but unfortunately this is been delayed because there is some question as to whether or not he may actually have a G.I. bleed due to being severely anemic. He's also apparently having some issues with his heart which is also delayed the process at this point. Unfortunately in general nothing seems to be improving if anything his wife tells me that this seems to be getting worse. 05/09/18 on evaluation today patient actually appears to be doing worse in regard to the gangrene of his right foot. This is actually spread to all toes and actually up a portion of his foot as well. He has been referred by podiatry to Dr. dew, vascular surgery, in order to evaluate for a below the knee amputation. Nonetheless the patient is having some discomfort but his biggest  concern that he seems to be stuck on more than anything is how far up Dr. dew is going to have to take his leg off. This is a great concern for him. In fact he asked me the same question at least 4-5 times during evaluation today ADMISSION 02/01/2019 This is now a 63 year old man with type 2 diabetes and known PAD. 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 According to the history that his wife is giving [place] that the wound held together when the sutures were removed however gradually this opened and expanded. He was taken to the OR on 08/08/2018 for debridement. He had a wound VAC on this for several months which apparently resulted in good improvement with the wound but the patient states he could not tolerate it. Currently they are using Xeroform ABDs and gauze. They have been followed extensively vein and vascular by Dr. dew service. The patient had an angiogram on 01/13/2018 which showed a normal CFA, PFA and stents in both iliac arteries were patent. ADI, DORO ([id number]) These were placed in March 2019. The patient had an MRI of the stump with contrast in December 2019. This suggested inflammatory changes within the anterior soft tissue of the remaining lower leg status post below-knee amputation there was some suggestion of a sinus tract. Adjacent to the soft tissue change there was marrow T2 hyperdensity and low-level enhancement which could reflect osteomyelitis. At this point I am not sure what was done about this report I will need to look back through the records. Past medical history; type 2 diabetes with known PAD, paroxysmal A. fib on Eliquis, iron deficiency anemia, hyperlipidemia, left occipital CVA, right external iliac stent 3/19. 7/8; the patient apparently tolerated the Dakin's wet-to-dry twice daily  until this morning and then they put Xeroform on it. Area I debrided last week looks quite good. 7/15; using Dakin's wet-to-dry. Wound is  coming along from debridement point of view. He still uses tramadol for pain and I have given him a prescription for this today Electronic Signature(s) Signed: 02/15/2019 4:31:45 PM By: Linton Ham MD Entered By: Linton Ham on 02/15/2019 14:59:07 Gotsch, Louie Boston ([id number]) -------------------------------------------------------------------------------- Physical Exam Details Patient Name: Glenn Standard B. Date of Service: 02/15/2019 2:00 PM Medical Record Number: [id number] Patient Account Number: [id number] Date of Birth/Sex: Sep 16, 1955 (63 y.o. M) Treating RN: Cornell Barman Primary Care Provider: Park Liter Other Clinician: Referring Provider: Park Liter Treating Provider/Extender: Tito Dine in Treatment: 2 Constitutional Sitting or standing Blood Pressure is within target range for patient.. Pulse regular and within target range for patient.Marland Kitchen Respirations regular, non-labored and within target range.. Temperature is normal and within the target range for the patient.Marland Kitchen appears in no distress. Musculoskeletal He is developing a mild flexion contracture of the right knee I pointed this out to him and told him to continue with flexion and extension exercises. Notes Wound exam; the stump looks fairly well perfused. Popliteal arteries are palpable but maybe not robust. Using a #5 curette extensive debridement of about 50% of the wound area. The surface is beginning to improve no major epithelialization yet. Electronic Signature(s) Signed: 02/15/2019 4:31:45 PM By: Linton Ham MD Entered By: Linton Ham on 02/15/2019 15:00:25 Pirani, Louie Boston ([id number]) -------------------------------------------------------------------------------- Physician Orders Details Patient Name: [name] Date of Service: 02/15/2019 2:00 PM Medical Record Number: [id number] Patient Account Number: [id number] Date of Birth/Sex: Dec 25, 1955 (63 y.o. M) Treating RN:  Cornell Barman Primary Care Provider: Park Liter Other Clinician: Referring Provider: Park Liter Treating Provider/Extender: Tito Dine in Treatment: 2 Verbal / Phone Orders: Yes Clinician: Cornell Barman Read Back and Verified: No Diagnosis Coding Wound Cleansing Wound #4 Right Amputation Site - Below Knee o Cleanse wound with mild soap and water Anesthetic (add to Medication List) Wound #4 Right Amputation Site - Below Knee o Topical Lidocaine 4% cream applied to wound bed prior to debridement (In Clinic Only). o Benzocaine Topical Anesthetic Spray applied to wound bed prior to debridement (In Clinic Only). Primary Wound Dressing Wound #4 Right Amputation Site - Below Knee o Other: - Dakins soaked gauze Secondary Dressing Wound #4 Right Amputation Site - Below Knee o ABD and Kerlix/Conform Dressing Change Frequency Wound #4 Right Amputation Site - Below Knee o Change dressing every day. Follow-up Appointments Wound #4 Right Amputation Site - Below Knee o Return Appointment in 1 week. Edema Control Wound #4 Right Amputation Site - Below Knee o Elevate legs to the level of the heart and pump ankles as often as possible Additional Orders / Instructions Wound #4 Right Amputation Site - Below Knee o Activity as tolerated Medications-please add to medication list. Wound #4 Right Amputation Site - Below Knee o Other: - Tramadol Electronic Signature(s) Signed: 02/15/2019 4:20:22 PM By: Gretta Cool, BSN, RN, CWS, Kim RN, BSN Husk, [place] BMarland Kitchen ([id number]) Signed: 02/15/2019 4:31:45 PM By: Linton Ham MD Entered By: Gretta Cool, BSN, RN, CWS, Kim on 02/15/2019 14:45:46 Mungia, Louie Boston ([id number]) -------------------------------------------------------------------------------- Problem List Details Patient Name: JAMIE, BELGER B. Date of Service: 02/15/2019 2:00 PM Medical Record Number: [id number] Patient Account Number: [id number] Date of Birth/Sex:  August 30, 1955 (63 y.o. M) Treating RN: Cornell Barman Primary Care Provider: Park Liter Other Clinician: Referring Provider: Park Liter Treating Provider/Extender: Tito Dine in  Treatment: 2 Active Problems ICD-10 Evaluated Encounter Code Description Active Date Today Diagnosis E11.51 Type 2 diabetes mellitus with diabetic peripheral angiopathy 02/01/2019 No Yes without gangrene 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 02/01/2019 No Yes limited to breakdown of skin E11.622 Type 2 diabetes mellitus with other skin ulcer 02/01/2019 No Yes Z89.511 Acquired absence of right leg below knee 02/01/2019 No Yes Inactive Problems Resolved Problems Electronic Signature(s) Signed: 02/15/2019 4:31:45 PM By: Linton Ham MD Entered By: Linton Ham on 02/15/2019 14:53:16 Elenes, Louie Boston ([id number]) -------------------------------------------------------------------------------- Progress Note Details Patient Name: Glenn Standard B. Date of Service: 02/15/2019 2:00 PM Medical Record Number: [id number] Patient Account Number: [id number] Date of Birth/Sex: 11/10/1955 (63 y.o. M) Treating RN: Cornell Barman Primary Care Provider: Park Liter Other Clinician: Referring Provider: Park Liter Treating Provider/Extender: Tito Dine in Treatment: 2 Subjective History of Present Illness (HPI) 03/03/18- He is here for initial evaluation of right toe wounds and tissue necrosis. He saw Dr. Vickki Muff on 7/23 where he was noted to have progressive ischemic changes to his toes. He was referred from Dr. Fellows office to the wound clinic for possible hyperbaric oxygen therapy. At this time, based on the information we have at hand, there is no diagnosis that would qualify him for hyperbaric oxygen therapy. He underwent an angiogram on 6/13 by Dr. Lucky Cowboy which showed bilateral iliac artery stents with no significant stenosis, minimal disease to the right CFA, right  profunda, right SFA, right popliteal and three-vessel tibial runoff although both the posterior tibial and anterior tibial arteries are essentially occluded with foot and the peroneal artery terminates at the ankle; likely a combination of previous embolization from iliac disease as well as intermittent atrial fibrillation creating embolic particles. He did not have intervention on 6/13. arterial studies performed on 6/19 showed resting ABI to the 0.99 (right) and 1.09 (left). 03/10/18-He is seen in follow up evaluation. Plain film x-ray ordered last week revealed no evidence of acute osseous abnormality and dorsal soft tissue swelling of the midfoot. Culture that was obtained last week from the right great toe was negative. He has an appointment with Dr Lucky Cowboy on 8/23. 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 The right great toe with a small, superficial, open area with resolution of erythema and improvement in edema; will continue with medihoney. He will follow up next week 03/17/18-He is seen in follow-up evaluation. He was able to reschedule his appointment with vascular medicine and was seen on 8/14. In office ABIs are similar to studies on 6/19, right toe waveform is blunted with known small vessel disease; they are recommending compression therapy which the patient is refusing to wear. 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 Vickki Muff. The patient's wife is going to try and move that appointment up to next week. The patient does exhibit right foot pain consistent with rest pain, relieved with dependent position. There is cyanotic discoloration to right toes 1-3 and along the lateral aspect and heel of the right foot. The wound to the dorsal aspect of the right great toe is larger, dry; we will initiate santyl. He can follow up in 1-2 weeks 04/15/18 on evaluation today patient's right foot ulcer in regard to the fourth and fifth toes really  appears to be no better. In fact this is pretty much a complete region of gangrene that this point. He also has microvascular disease and the remaining toes and the discoloration  noted today is extremely cyanotic blue/purple. He actually was supposed to have Artie had invitation but unfortunately this is been delayed because there is some question as to whether or not he may actually have a G.I. bleed due to being severely anemic. He's also apparently having some issues with his heart which is also delayed the process at this point. Unfortunately in general nothing seems to be improving if anything his wife tells me that this seems to be getting worse. 05/09/18 on evaluation today patient actually appears to be doing worse in regard to the gangrene of his right foot. This is actually spread to all toes and actually up a portion of his foot as well. He has been referred by podiatry to Dr. dew, vascular surgery, in order to evaluate for a below the knee amputation. Nonetheless the patient is having some discomfort but his biggest concern that he seems to be stuck on more than anything is how far up Dr. dew is going to have to take his leg off. This is a great concern for him. In fact he asked me the same question at least 4-5 times during evaluation today ADMISSION 02/01/2019 This is now a 63 year old man with type 2 diabetes and known PAD. 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 According to the history that his wife is giving [place] that the wound held together when the sutures were removed however gradually this opened and expanded. He was taken to the OR on 08/08/2018 for debridement. He had a wound VAC on this for several months which apparently resulted in good improvement with the wound but the patient states he could not tolerate it. Currently they are using Xeroform ABDs and gauze. They have been followed extensively vein and vascular by Dr. dew service. DELOS, KLICH ([id number]) The patient had an angiogram on 01/13/2018 which showed a normal CFA, PFA and stents in both iliac arteries were patent. These were placed in March 2019. The patient had an MRI of the stump with contrast in December 2019. This suggested inflammatory changes within the anterior soft tissue of the remaining lower leg status post below-knee amputation there was some suggestion of a sinus tract. Adjacent to the soft tissue change there was marrow T2 hyperdensity and low-level enhancement which could reflect osteomyelitis. At this point I am not sure what was done about this report I will need to look back through the records. Past medical history; type 2 diabetes with known PAD, paroxysmal A. fib on Eliquis, iron deficiency anemia, hyperlipidemia, left occipital CVA, right external iliac stent 3/19. 7/8; the patient apparently tolerated the Dakin's wet-to-dry twice daily until this morning and then they put Xeroform on it. Area I debrided last week looks quite good. 7/15; using Dakin's wet-to-dry. Wound is coming along from debridement point of view. He still uses tramadol for pain and I have given him a prescription for this today Objective Constitutional Sitting or standing Blood Pressure is within target range for patient.. Pulse regular and within target range for patient.Marland Kitchen Respirations regular, non-labored and within target range.. Temperature is normal and within the target range for the patient.Marland Kitchen appears in no distress. Vitals Time Taken: 1:54 PM, Height: 70 in, Weight: 175 lbs, BMI: 25.1, Temperature: 98.7 F, Pulse: 64 bpm, Respiratory Rate: 16 breaths/min, Blood Pressure: 105/59 mmHg. Musculoskeletal He is developing a mild flexion contracture of the right knee I pointed this out to him and told him to continue with  flexion and extension exercises. General Notes: Wound exam; the stump looks fairly well perfused. Popliteal arteries are palpable but maybe not robust.  Using a #5 curette extensive debridement of about 50% of the wound area. The surface is beginning to improve no major epithelialization yet. Integumentary (Hair, Skin) Wound #4 status is Open. Original cause of wound was Surgical Injury. The wound is located on the Right Amputation Site - Below Knee. The wound measures 12.3cm length x 12.1cm width x 0.2cm depth; 116.891cm^2 area and 23.378cm^3 volume. There is Fat Layer (Subcutaneous Tissue) Exposed exposed. There is no tunneling or undermining noted. There is a large amount of serous drainage noted. The wound margin is flat and intact. There is small (1-33%) pink granulation within the wound bed. There is a large (67-100%) amount of necrotic tissue within the wound bed including Adherent Slough. Assessment Active Problems Zweig, Mang B. ([id number]) ICD-10 Type 2 diabetes mellitus with diabetic peripheral angiopathy without gangrene Non-pressure chronic ulcer of other part of right lower leg limited to breakdown of skin Type 2 diabetes mellitus with other skin ulcer Acquired absence of right leg below knee Procedures Wound #4 Pre-procedure diagnosis of Wound #4 is a Diabetic Wound/Ulcer of the Lower Extremity located on the Right Amputation Site - Below Knee .Severity of Tissue Pre Debridement is: Fat layer exposed. There was a Excisional Skin/Subcutaneous Tissue Debridement with a total area of 148.83 sq cm performed by Ricard Dillon, MD. With the following instrument(s): Curette to remove Viable and Non-Viable tissue/material. Material removed includes Subcutaneous Tissue and Slough and after achieving pain control using Lidocaine. No specimens were taken. A time out was conducted at 14:37, prior to the start of the procedure. A Minimum amount of bleeding was controlled with Pressure. The procedure was tolerated well. Post Debridement Measurements: 12.3cm length x 12.1cm width x 0.3cm depth; 35.067cm^3 volume. Character of  Wound/Ulcer Post Debridement is stable. Severity of Tissue Post Debridement is: Fat layer exposed. 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 Benzocaine Topical Anesthetic Spray applied to wound bed prior to debridement (In Clinic Only). Primary Wound Dressing: Wound #4 Right Amputation Site - Below Knee: Other: - Dakins soaked gauze Secondary Dressing: Wound #4 Right Amputation Site - Below Knee: ABD and Kerlix/Conform Dressing Change Frequency: Wound #4 Right Amputation Site - Below Knee: Change dressing every day. 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 Edema Control: Wound #4 Right Amputation Site - Below Knee: Elevate legs to the level of the heart and pump ankles as often as possible Additional Orders / Instructions: Wound #4 Right Amputation Site - Below Knee: Activity as tolerated Medications-please add to medication list.: ATTIKUS, BARTOSZEK B. ([id number]) Wound #4 Right Amputation Site - Below Knee: Other: - Tramadol 1. I am going to continue with the full strength Dakin's daily 2. We are making progress in terms of the general debridement 3. The patient has known PAD although I think this is being perfused adequately. Electronic Signature(s) Signed: 02/15/2019 4:31:45 PM By: Linton Ham MD Entered By: Linton Ham on 02/15/2019 15:02:58 Pesci, Louie Boston ([id number]) -------------------------------------------------------------------------------- SuperBill Details Patient Name: Glenn Standard B. Date of Service: 02/15/2019 Medical Record Number: [id number] Patient Account Number: [id number]  Date of Birth/Sex: May 30, 1956  (63 y.o. M) Treating RN: Cornell Barman Primary Care Provider: Park Liter Other Clinician: Referring Provider: Park Liter Treating Provider/Extender: Tito Dine in Treatment: 2 Diagnosis Coding ICD-10 Codes Code Description E11.51 Type 2 diabetes mellitus with diabetic peripheral angiopathy without gangrene 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 limited to breakdown of skin E11.622 Type 2 diabetes mellitus with other skin ulcer Z89.511 Acquired absence of right leg below knee Facility Procedures CPT4 Code Description: [id number] 11042 - DEB SUBQ TISSUE 20 SQ CM/< ICD-10 Diagnosis Description 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 limi Modifier: ted to breakdow Quantity: 1 n of skin CPT4 Code Description: [id number] 11045 - DEB SUBQ TISS EA ADDL 20CM ICD-10 Diagnosis Description 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 limi Modifier: ted to breakdow Quantity: 7 n of skin Physician Procedures CPT4 Code Description: [id number] - WC PHYS SUBQ TISS 20 SQ CM ICD-10 Diagnosis Description 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

## 2019-02-17 DIAGNOSIS — Z89511 Acquired absence of right leg below knee: Secondary | ICD-10-CM | POA: Diagnosis not present

## 2019-02-17 DIAGNOSIS — S98921A Partial traumatic amputation of right foot, level unspecified, initial encounter: Secondary | ICD-10-CM | POA: Diagnosis not present

## 2019-02-22 ENCOUNTER — Other Ambulatory Visit: Payer: Self-pay

## 2019-02-22 ENCOUNTER — Encounter: Payer: Medicare HMO | Admitting: Internal Medicine

## 2019-02-22 DIAGNOSIS — Z7901 Long term (current) use of anticoagulants: Secondary | ICD-10-CM | POA: Diagnosis not present

## 2019-02-22 DIAGNOSIS — L97811 Non-pressure chronic ulcer of other part of right lower leg limited to breakdown of skin: Secondary | ICD-10-CM | POA: Diagnosis not present

## 2019-02-22 DIAGNOSIS — Z89511 Acquired absence of right leg below knee: Secondary | ICD-10-CM | POA: Diagnosis not present

## 2019-02-22 DIAGNOSIS — D509 Iron deficiency anemia, unspecified: Secondary | ICD-10-CM | POA: Diagnosis not present

## 2019-02-22 DIAGNOSIS — L97812 Non-pressure chronic ulcer of other part of right lower leg with fat layer exposed: Secondary | ICD-10-CM | POA: Diagnosis not present

## 2019-02-22 DIAGNOSIS — E11621 Type 2 diabetes mellitus with foot ulcer: Secondary | ICD-10-CM | POA: Diagnosis not present

## 2019-02-22 DIAGNOSIS — E1151 Type 2 diabetes mellitus with diabetic peripheral angiopathy without gangrene: Secondary | ICD-10-CM | POA: Diagnosis not present

## 2019-02-22 DIAGNOSIS — T8189XA Other complications of procedures, not elsewhere classified, initial encounter: Secondary | ICD-10-CM | POA: Diagnosis not present

## 2019-02-22 DIAGNOSIS — I480 Paroxysmal atrial fibrillation: Secondary | ICD-10-CM | POA: Diagnosis not present

## 2019-02-22 DIAGNOSIS — E11622 Type 2 diabetes mellitus with other skin ulcer: Secondary | ICD-10-CM | POA: Diagnosis not present

## 2019-02-22 NOTE — Progress Notes (Signed)
HENRIQUE, PAREKH ([id number]) Visit Report for 02/22/2019 Debridement Details Patient Name: [name], [name]. Date of Service: 02/22/2019 3:00 PM Medical Record Number: [id number] Patient Account Number: [id number] Date of Birth/Sex: 1956/02/24 (63 y.o. M) Treating RN: Cornell Barman Primary Care Provider: Park Liter Other Clinician: Referring Provider: Park Liter Treating Provider/Extender: Tito Dine in Treatment: 3 Debridement Performed for Wound #4 Right Amputation Site - Below Knee Assessment: Performed By: Physician Ricard Dillon, MD Debridement Type: Debridement Severity of Tissue Pre Fat layer exposed Debridement: Level of Consciousness (Pre- Awake and Alert procedure): Pre-procedure Verification/Time Yes - 15:15 Out Taken: Start Time: 15:15 Pain Control: Lidocaine Total Area Debrided (L x W): 12.5 (cm) x 6 (cm) = 75 (cm) Tissue and other material Viable, Non-Viable, Slough, Subcutaneous, Slough debrided: Level: Skin/Subcutaneous Tissue Debridement Description: Excisional Instrument: Curette Bleeding: Minimum Hemostasis Achieved: Pressure End Time: 15:19 Response to Treatment: Procedure was tolerated well Level of Consciousness Awake and Alert (Post-procedure): Post Debridement Measurements of Total Wound Length: (cm) 12.5 Width: (cm) 12.5 Depth: (cm) 0.2 Volume: (cm) 24.544 Character of Wound/Ulcer Post Debridement: Stable Severity of Tissue Post Debridement: Fat layer exposed Post Procedure Diagnosis Same as Pre-procedure Electronic Signature(s) Signed: 02/22/2019 4:35:36 PM By: Linton Ham MD Signed: 02/22/2019 4:45:53 PM By: Gretta Cool, BSN, RN, CWS, Kim RN, BSN Entered By: Linton Ham on 02/22/2019 15:59:13 [name], [name] ([id number]) -------------------------------------------------------------------------------- HPI Details Patient Name: Glenn Standard B. Date of Service: 02/22/2019 3:00 PM Medical Record Number:  [id number] Patient Account Number: [id number] Date of Birth/Sex: 12/04/1955 (63 y.o. M) Treating RN: Cornell Barman Primary Care Provider: Park Liter Other Clinician: Referring Provider: Park Liter Treating Provider/Extender: Tito Dine in Treatment: 3 History of Present Illness HPI Description: 03/03/18- He is here for initial evaluation of right toe wounds and tissue necrosis. He saw Dr. Vickki Muff on 7/23 where he was noted to have progressive ischemic changes to his toes. He was referred from Dr. Fellows office to the wound clinic for possible hyperbaric oxygen therapy. At this time, based on the information we have at hand, there is no diagnosis that would qualify him for hyperbaric oxygen therapy. He underwent an angiogram on 6/13 by Dr. 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 He did not have intervention on 6/13. arterial studies performed on 6/19 showed resting ABI to the 0.99 (right) and 1.09 (left). 03/10/18-He is seen in follow up evaluation. Plain film x-ray ordered last week revealed no evidence of acute osseous abnormality and dorsal soft tissue swelling of the midfoot. Culture that was obtained last week from the right great toe was negative. He has an appointment with Dr Lucky Cowboy on 8/23. 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 The right great toe with a small, superficial, open area with resolution of erythema and improvement in edema; will continue with medihoney. He will follow up next week 03/17/18-He is seen in  follow-up evaluation. He was able to reschedule his appointment with vascular medicine and was seen on 8/14. In office ABIs are similar to studies on 6/19, right toe waveform is blunted with known small vessel disease; they are recommending compression therapy  which the patient is refusing to wear. 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 Vickki Muff. The patient's wife is going to try and move that appointment up to next week. The patient does exhibit right foot pain consistent with rest pain, relieved with dependent position. There is cyanotic discoloration to right toes 1-3 and along the lateral aspect and heel of the right foot. The wound to the dorsal aspect of the right great toe is larger, dry; we will initiate santyl. He can follow up in 1-2 weeks 04/15/18 on evaluation today patient's right foot ulcer in regard to the fourth and fifth toes really appears to be no better. In fact this is pretty much a complete region of gangrene that this point. He also has microvascular disease and the remaining toes and the discoloration noted today is extremely cyanotic blue/purple. He actually was supposed to have Artie had invitation but unfortunately this is been delayed because there is some question as to whether or not he may actually have a G.I. bleed due to being severely anemic. He's also apparently having some issues with his heart which is also delayed the process at this point. Unfortunately in general nothing seems to be improving if anything his wife tells me that this seems to be getting worse. 05/09/18 on evaluation today patient actually appears to be doing worse in regard to the gangrene of his right foot. This is actually spread to all toes and actually up a portion of his foot as well. He has been referred by podiatry to Dr. dew, vascular surgery, in order to evaluate for a below the knee amputation. Nonetheless the patient is having some discomfort but his biggest  concern that he seems to be stuck on more than anything is how far up Dr. dew is going to have to take his leg off. This is a great concern for him. In fact he asked me the same question at least 4-5 times during evaluation today ADMISSION 02/01/2019 This is now a 63 year old man with type 2 diabetes and known PAD. 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 According to the history that his wife is giving [place] that the wound held together when the sutures were removed however gradually this opened and expanded. He was taken to the OR on 08/08/2018 for debridement. He had a wound VAC on this for several months which apparently resulted in good improvement with the wound but the patient states he could not tolerate it. Currently they are using Xeroform ABDs and gauze. They have been followed extensively vein and vascular by Dr. dew service. The patient had an angiogram on 01/13/2018 which showed a normal CFA, PFA and stents in both iliac arteries were patent. [name], [name] ([id number]) These were placed in March 2019. The patient had an MRI of the stump with contrast in December 2019. This suggested inflammatory changes within the anterior soft tissue of the remaining lower leg status post below-knee amputation there was some suggestion of a sinus tract. Adjacent to the soft tissue change there was marrow T2 hyperdensity and low-level enhancement which could reflect osteomyelitis. At this point I am not sure what was done about this report I will need to look back through the records. Past medical history; type 2 diabetes with known PAD, paroxysmal A. fib on Eliquis, iron deficiency anemia, hyperlipidemia, left occipital CVA, right external iliac stent 3/19. 7/8; the patient apparently tolerated the Dakin's wet-to-dry twice daily  until this morning and then they put Xeroform on it. Area I debrided last week looks quite good. 7/15; using Dakin's wet-to-dry. Wound is  coming along from debridement point of view. He still uses tramadol for pain and I have given him a prescription for this today 7/22 he is using Dakin's wet-to-dry. The wound surface continues to be a bit of a challenge and there really is not any epithelialization. He tolerates debridements even vigorous washing of the wound very poorly. I have talked to him about stump revisions which could be either below-knee if it is felt that he has enough blood flow to this area or above-knee. He is really opposed to any thought of above-knee amputation. Electronic Signature(s) Signed: 02/22/2019 4:35:36 PM By: Linton Ham MD Entered By: Linton Ham on 02/22/2019 16:05:54 [name], [name] ([id number]) -------------------------------------------------------------------------------- Physical Exam Details Patient Name: Glenn Standard B. Date of Service: 02/22/2019 3:00 PM Medical Record Number: [id number] Patient Account Number: [id number] Date of Birth/Sex: March 27, 1956 (63 y.o. M) Treating RN: Cornell Barman Primary Care Provider: Park Liter Other Clinician: Referring Provider: Park Liter Treating Provider/Extender: Tito Dine in Treatment: 3 Musculoskeletal . Notes Wound exam; disappointing appearance of the stump today. There is a lot of circumferential eschar I removed this with a #5 curette he tolerates this poorly. I did the final debridement of about 40% of the surface area. There is bleeding but not a lot of bleeding. Hemostasis with direct pressure. I can feel his popliteal pulse although it is certainly not robust. Femoral pulses palpable Electronic Signature(s) Signed: 02/22/2019 4:35:36 PM By: Linton Ham MD Entered By: Linton Ham on 02/22/2019 16:08:01 [name], [name] ([id number]) -------------------------------------------------------------------------------- Physician Orders Details Patient Name: Glenn Standard B. Date of Service: 02/22/2019 3:00  PM Medical Record Number: [id number] Patient Account Number: [id number] Date of Birth/Sex: September 20, 1955 (63 y.o. M) Treating RN: Cornell Barman Primary Care Provider: Park Liter Other Clinician: Referring Provider: Park Liter Treating Provider/Extender: Tito Dine in Treatment: 3 Verbal / Phone Orders: No Diagnosis Coding Wound Cleansing Wound #4 Right Amputation Site - Below Knee o Cleanse wound with mild soap and water Anesthetic (add to Medication List) Wound #4 Right Amputation Site - Below Knee o Topical Lidocaine 4% cream applied to wound bed prior to debridement (In Clinic Only). o Benzocaine Topical Anesthetic Spray applied to wound bed prior to debridement (In Clinic Only). Primary Wound Dressing Wound #4 Right Amputation Site - Below Knee o Other: - mepitel one; Dakins soaked gauze Secondary Dressing Wound #4 Right Amputation Site - Below Knee o ABD and Kerlix/Conform Dressing Change Frequency Wound #4 Right Amputation Site - Below Knee o Change dressing every day. Follow-up Appointments Wound #4 Right Amputation Site - Below Knee o Return Appointment in 1 week. Edema Control Wound #4 Right Amputation Site - Below Knee o Elevate legs to the level of the heart and pump ankles as often as possible Additional Orders / Instructions Wound #4 Right Amputation Site - Below Knee o Activity as tolerated Medications-please add to medication list. Wound #4 Right Amputation Site - Below Knee o Other: - Tramadol refilled at last visit Electronic Signature(s) Signed: 02/22/2019 4:35:36 PM By: Linton Ham MD [name] ([id number]) Signed: 02/22/2019 4:45:53 PM By: Gretta Cool, BSN, RN, CWS, Kim RN, BSN Entered By: Gretta Cool, BSN, RN, CWS, Kim on 02/22/2019 15:22:06 [name], [name] ([id number]) -------------------------------------------------------------------------------- Problem List Details Patient Name: REYNALDO, ROSSMAN B. Date of Service:  02/22/2019 3:00 PM Medical Record Number: [id number] Patient Account Number:  [id number] Date of Birth/Sex: [name]-1957 (63 y.o. M) Treating RN: Cornell Barman Primary Care Provider: Park Liter Other Clinician: Referring Provider: Park Liter Treating Provider/Extender: Tito Dine in Treatment: 3 Active Problems ICD-10 Evaluated Encounter Code Description Active Date Today Diagnosis E11.51 Type 2 diabetes mellitus with diabetic peripheral angiopathy 02/01/2019 No Yes without gangrene 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 02/01/2019 No Yes limited to breakdown of skin E11.622 Type 2 diabetes mellitus with other skin ulcer 02/01/2019 No Yes Z89.511 Acquired absence of right leg below knee 02/01/2019 No Yes Inactive Problems Resolved Problems Electronic Signature(s) Signed: 02/22/2019 4:35:36 PM By: Linton Ham MD Entered By: Linton Ham on 02/22/2019 15:58:42 [name], [name] ([id number]) -------------------------------------------------------------------------------- Progress Note Details Patient Name: Glenn Standard B. Date of Service: 02/22/2019 3:00 PM Medical Record Number: [id number] Patient Account Number: [id number] Date of Birth/Sex: 10/29/1955 (63 y.o. M) Treating RN: Cornell Barman Primary Care Provider: Park Liter Other Clinician: Referring Provider: Park Liter Treating Provider/Extender: Tito Dine in Treatment: 3 Subjective History of Present Illness (HPI) 03/03/18- He is here for initial evaluation of right toe wounds and tissue necrosis. He saw Dr. Vickki Muff on 7/23 where he was noted to have progressive ischemic changes to his toes. He was referred from Dr. Fellows office to the wound clinic for possible hyperbaric oxygen therapy. At this time, based on the information we have at hand, there is no diagnosis that would qualify him for hyperbaric oxygen therapy. He underwent an angiogram on 6/13 by Dr. Lucky Cowboy which  showed bilateral iliac artery stents with no significant stenosis, minimal disease to the right CFA, right profunda, right SFA, right popliteal and three-vessel tibial runoff although both the posterior tibial and anterior tibial arteries are essentially occluded with foot and the peroneal artery terminates at the ankle; likely a combination of previous embolization from iliac disease as well as intermittent atrial fibrillation creating embolic particles. He did not have intervention on 6/13. arterial studies performed on 6/19 showed resting ABI to the 0.99 (right) and 1.09 (left). 03/10/18-He is seen in follow up evaluation. Plain film x-ray ordered last week revealed no evidence of acute osseous abnormality and dorsal soft tissue swelling of the midfoot. Culture that was obtained last week from the right great toe was negative. He has an appointment with Dr Lucky Cowboy on 8/23. 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 The right great toe with a small, superficial, open area with resolution of erythema and improvement in edema; will continue with medihoney. He will follow up next week 03/17/18-He is seen in follow-up evaluation. He was able to reschedule his appointment with vascular medicine and was seen on 8/14. In office ABIs are similar to studies on 6/19, right toe waveform is blunted with known small vessel disease; they are recommending compression therapy which the patient is refusing to wear. 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 Vickki Muff. The patient's wife is going to try and move that appointment up to next week. The patient does exhibit right foot pain consistent with rest pain, relieved with dependent position. There is cyanotic discoloration to right toes 1-3 and along the lateral aspect and heel of the right foot. The wound to the dorsal aspect of the right great toe is larger, dry; we will initiate santyl. He can follow up in 1-2  weeks 04/15/18 on evaluation today patient's right foot ulcer in regard to the fourth and fifth toes really appears  to be no better. In fact this is pretty much a complete region of gangrene that this point. He also has microvascular disease and the remaining toes and the discoloration noted today is extremely cyanotic blue/purple. He actually was supposed to have Artie had invitation but unfortunately this is been delayed because there is some question as to whether or not he may actually have a G.I. bleed due to being severely anemic. He's also apparently having some issues with his heart which is also delayed the process at this point. Unfortunately in general nothing seems to be improving if anything his wife tells me that this seems to be getting worse. 05/09/18 on evaluation today patient actually appears to be doing worse in regard to the gangrene of his right foot. This is actually spread to all toes and actually up a portion of his foot as well. He has been referred by podiatry to Dr. dew, vascular surgery, in order to evaluate for a below the knee amputation. Nonetheless the patient is having some discomfort but his biggest concern that he seems to be stuck on more than anything is how far up Dr. dew is going to have to take his leg off. This is a great concern for him. In fact he asked me the same question at least 4-5 times during evaluation today ADMISSION 02/01/2019 This is now a 63 year old man with type 2 diabetes and known PAD. 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 According to the history that his wife is giving [place] that the wound held together when the sutures were removed however gradually this opened and expanded. He was taken to the OR on 08/08/2018 for debridement. He had a wound VAC on this for several months which apparently resulted in good improvement with the wound but the patient states he could not tolerate it. Currently they are using  Xeroform ABDs and gauze. They have been followed extensively vein and vascular by Dr. dew service. [name], [name] ([id number]) The patient had an angiogram on 01/13/2018 which showed a normal CFA, PFA and stents in both iliac arteries were patent. These were placed in March 2019. The patient had an MRI of the stump with contrast in December 2019. This suggested inflammatory changes within the anterior soft tissue of the remaining lower leg status post below-knee amputation there was some suggestion of a sinus tract. Adjacent to the soft tissue change there was marrow T2 hyperdensity and low-level enhancement which could reflect osteomyelitis. At this point I am not sure what was done about this report I will need to look back through the records. Past medical history; type 2 diabetes with known PAD, paroxysmal A. fib on Eliquis, iron deficiency anemia, hyperlipidemia, left occipital CVA, right external iliac stent 3/19. 7/8; the patient apparently tolerated the Dakin's wet-to-dry twice daily until this morning and then they put Xeroform on it. Area I debrided last week looks quite good. 7/15; using Dakin's wet-to-dry. Wound is coming along from debridement point of view. He still uses tramadol for pain and I have given him a prescription for this today 7/22 he is using Dakin's wet-to-dry. The wound surface continues to be a bit of a challenge and there really is not any epithelialization. He tolerates debridements even vigorous washing of the wound very poorly. I have talked to him about stump revisions which could be either below-knee if it is felt that he has enough blood flow to this area or above-knee. He  is really opposed to any thought of above-knee amputation. Objective Constitutional Vitals Time Taken: 2:45 PM, Height: 70 in, Weight: 175 lbs, BMI: 25.1, Temperature: 98.4 F, Pulse: 77 bpm, Respiratory Rate: 18 breaths/min, Blood Pressure: 103/59 mmHg. General Notes: Wound exam;  disappointing appearance of the stump today. There is a lot of circumferential eschar I removed this with a #5 curette he tolerates this poorly. I did the final debridement of about 40% of the surface area. There is bleeding but not a lot of bleeding. Hemostasis with direct pressure. I can feel his popliteal pulse although it is certainly not robust. Femoral pulses palpable Integumentary (Hair, Skin) Wound #4 status is Open. Original cause of wound was Surgical Injury. The wound is located on the Right Amputation Site - Below Knee. The wound measures 12.5cm length x 12.5cm width x 0.2cm depth; 122.718cm^2 area and 24.544cm^3 volume. There is Fat Layer (Subcutaneous Tissue) Exposed exposed. There is no tunneling or undermining noted. There is a large amount of serous drainage noted. The wound margin is flat and intact. There is small (1-33%) pink granulation within the wound bed. There is a large (67-100%) amount of necrotic tissue within the wound bed including Adherent Slough. Assessment Active Problems ICD-10 Type 2 diabetes mellitus with diabetic peripheral angiopathy without gangrene Non-pressure chronic ulcer of other part of right lower leg limited to breakdown of skin [name], [name] ([id number]) Type 2 diabetes mellitus with other skin ulcer Acquired absence of right leg below knee Procedures Wound #4 Pre-procedure diagnosis of Wound #4 is a Diabetic Wound/Ulcer of the Lower Extremity located on the Right Amputation Site - Below Knee .Severity of Tissue Pre Debridement is: Fat layer exposed. There was a Excisional Skin/Subcutaneous Tissue Debridement with a total area of 75 sq cm performed by Ricard Dillon, MD. With the following instrument(s): Curette to remove Viable and Non-Viable tissue/material. Material removed includes Subcutaneous Tissue and Slough and after achieving pain control using Lidocaine. No specimens were taken. A time out was conducted at 15:15, prior to the  start of the procedure. A Minimum amount of bleeding was controlled with Pressure. The procedure was tolerated well. Post Debridement Measurements: 12.5cm length x 12.5cm width x 0.2cm depth; 24.544cm^3 volume. Character of Wound/Ulcer Post Debridement is stable. Severity of Tissue Post Debridement is: Fat layer exposed. 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 Benzocaine Topical Anesthetic Spray applied to wound bed prior to debridement (In Clinic Only). Primary Wound Dressing: Wound #4 Right Amputation Site - Below Knee: Other: - mepitel one; Dakins soaked gauze Secondary Dressing: Wound #4 Right Amputation Site - Below Knee: ABD and Kerlix/Conform Dressing Change Frequency: Wound #4 Right Amputation Site - Below Knee: Change dressing every day. 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 Edema Control: Wound #4 Right Amputation Site - Below Knee: Elevate legs to the level of the heart and pump ankles as often as possible Additional Orders / Instructions: Wound #4 Right Amputation Site - Below Knee: Activity as tolerated Medications-please add to medication list.: Wound #4 Right Amputation Site - Below Knee: Other: - Tramadol refilled at last visit [name], [name] ([id number]) 1. I am continuing with the Dakin's 2. Disappointing in that  there is been no beginnings of epithelialization 3. I need to look over his vascular investigations. He has bilateral iliac artery stents. The question is does he have enough blood flow to this below knee amputation site. The second question would be does not have enough blood flow for revision and if  so at what level Electronic Signature(s) Signed: 02/22/2019 4:35:36 PM By: Linton Ham MD Entered By: Linton Ham on 02/22/2019 16:10:35 [name], [name] ([id number]) -------------------------------------------------------------------------------- SuperBill Details Patient Name: Glenn Standard B. Date of Service: 02/22/2019 Medical Record Number: [id number] Patient Account Number: [id number] Date of Birth/Sex: 08/17/1955 (63 y.o. M) Treating RN: Cornell Barman Primary Care Provider: Park Liter Other Clinician: Referring Provider: Park Liter Treating Provider/Extender: Tito Dine in Treatment: 3 Diagnosis Coding ICD-10 Codes Code Description E11.51 Type 2 diabetes mellitus with diabetic peripheral angiopathy without gangrene 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 limited to breakdown of skin E11.622 Type 2 diabetes mellitus with other skin ulcer Z89.511 Acquired absence of right leg below knee Facility Procedures CPT4 Code Description: 00923300 11042 - DEB SUBQ TISSUE 20 SQ CM/< ICD-10 Diagnosis Description 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 2 diabetes mellitus with diabetic peripheral angiopathy wit Modifier: ted to breakdow hout gangrene Quantity: 1 n of skin CPT4 Code Description: 76226333 11045 - DEB SUBQ TISS EA ADDL 20CM ICD-10 Diagnosis Description E11.51 Type 2 diabetes mellitus with diabetic peripheral angiopathy wit Modifier: hout gangrene Quantity: 3 Physician Procedures CPT4 Code Description: [id number] 38937 - WC PHYS SUBQ TISS 20 SQ CM ICD-10 Diagnosis Description 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

## 2019-02-23 NOTE — Progress Notes (Signed)
AUGUSTO, DECKMAN ([id number]) Visit Report for 02/22/2019 Arrival Information Details Patient Name: [name], [name]. Date of Service: 02/22/2019 3:00 PM Medical Record Number: [id number] Patient Account Number: [id number] Date of Birth/Sex: [birth year] (63 y.o. M) Treating RN: Harold Barban Primary Care Elley Harp: Park Liter Other Clinician: Referring Anothy Bufano: Park Liter Treating Adewale Pucillo/Extender: Tito Dine in Treatment: 3 Visit Information History Since Last Visit Added or deleted any medications: No Patient Arrived: Wheel Chair Any new allergies or adverse reactions: No Arrival Time: 14:48 Had a fall or experienced change in No Accompanied By: wife activities of daily living that may affect Transfer Assistance: EasyPivot Patient Lift risk of falls: Patient Identification Verified: Yes Signs or symptoms of abuse/neglect since last visito No Secondary Verification Process Yes Hospitalized since last visit: No Completed: Has Dressing in Place as Prescribed: Yes Patient Has Alerts: Yes Pain Present Now: Yes Patient Alerts: Patient on Blood Thinner DMII Eliquis Electronic Signature(s) Signed: 02/23/2019 3:44:22 PM By: Harold Barban Entered By: Harold Barban on 02/22/2019 14:51:10 Brinkman, Louie Boston ([id number]) -------------------------------------------------------------------------------- Encounter Discharge Information Details Patient Name: [name] Date of Service: 02/22/2019 3:00 PM Medical Record Number: [id number] Patient Account Number: [id number] Date of Birth/Sex: [birth year] (63 y.o. M) Treating RN: Cornell Barman Primary Care Darlisa Spruiell: Park Liter Other Clinician: Referring Zoee Heeney: Park Liter Treating Lerlene Treadwell/Extender: Tito Dine in Treatment: 3 Encounter Discharge Information Items Post Procedure Vitals Discharge Condition: Stable Temperature (F): 98.4 Ambulatory Status: Wheelchair Pulse (bpm):  77 Discharge Destination: Home Respiratory Rate (breaths/min): 18 Transportation: Private Auto Blood Pressure (mmHg): 103/59 Accompanied By: wife Schedule Follow-up Appointment: Yes Clinical Summary of Care: Electronic Signature(s) Signed: 02/22/2019 4:45:53 PM By: Gretta Cool, BSN, RN, CWS, Kim RN, BSN Entered By: Gretta Cool, BSN, RN, CWS, Kim on 02/22/2019 15:25:28 Aldana, Louie Boston ([id number]) -------------------------------------------------------------------------------- Lower Extremity Assessment Details Patient Name: [name] Date of Service: 02/22/2019 3:00 PM Medical Record Number: [id number] Patient Account Number: [id number] Date of Birth/Sex: Oct 22, 1955 (63 y.o. M) Treating RN: Harold Barban Primary Care Deajah Erkkila: Park Liter Other Clinician: Referring Patra Gherardi: Park Liter Treating Mckinsey Keagle/Extender: Tito Dine in Treatment: 3 Electronic Signature(s) Signed: 02/23/2019 3:44:22 PM By: Harold Barban Entered By: Harold Barban on 02/22/2019 15:01:30 Freyre, Louie Boston ([id number]) -------------------------------------------------------------------------------- Multi Wound Chart Details Patient Name: [name] Date of Service: 02/22/2019 3:00 PM Medical Record Number: [id number] Patient Account Number: [id number] Date of Birth/Sex: January 13, 1956 (63 y.o. M) Treating RN: Cornell Barman Primary Care Codie Krogh: Park Liter Other Clinician: Referring Ree Alcalde: Park Liter Treating Ameet Sandy/Extender: Tito Dine in Treatment: 3 Vital Signs Height(in): 70 Pulse(bpm): 10 Weight(lbs): 175 Blood Pressure(mmHg): 103/59 Body Mass Index(BMI): 25 Temperature(F): 98.4 Respiratory Rate 18 (breaths/min): Photos: [N/A:N/A] Wound Location: Right Amputation Site - Below N/A N/A Knee Wounding Event: Surgical Injury N/A N/A Primary Etiology: Diabetic Wound/Ulcer of the N/A N/A Lower Extremity Secondary Etiology: Arterial Insufficiency Ulcer  N/A N/A Comorbid History: Arrhythmia, Hypertension, N/A N/A Peripheral Arterial Disease, Type II Diabetes, Dementia Date Acquired: 01/31/2018 N/A N/A Weeks of Treatment: 3 N/A N/A Wound Status: Open N/A N/A Pending Amputation on Yes N/A N/A Presentation: Measurements L x W x D 12.5x12.5x0.2 N/A N/A (cm) Area (cm) : 122.718 N/A N/A Volume (cm) : 24.544 N/A N/A % Reduction in Area: 17.10% N/A N/A % Reduction in Volume: -65.80% N/A N/A Classification: Grade 2 N/A N/A Exudate Amount: Large N/A N/A Exudate Type: Serous N/A N/A Exudate Color: amber N/A N/A Wound Margin: Flat and Intact N/A N/A Granulation Amount: Small (1-33%) N/A N/A Granulation  Quality: Pink N/A N/A Necrotic Amount: Large (67-100%) N/A N/A Exposed Structures: N/A N/A DAIMON, KEAN B. ([id number]) Fat Layer (Subcutaneous Tissue) Exposed: Yes Fascia: No Tendon: No Muscle: No Joint: No Bone: No Epithelialization: None N/A N/A Debridement: Debridement - Excisional N/A N/A Pre-procedure 15:15 N/A N/A Verification/Time Out Taken: Pain Control: Lidocaine N/A N/A Tissue Debrided: Subcutaneous, Slough N/A N/A Level: Skin/Subcutaneous Tissue N/A N/A Debridement Area (sq cm): 75 N/A N/A Instrument: Curette N/A N/A Bleeding: Minimum N/A N/A Hemostasis Achieved: Pressure N/A N/A Debridement Treatment Procedure was tolerated well N/A N/A Response: Post Debridement 12.5x12.5x0.2 N/A N/A Measurements L x W x D (cm) Post Debridement Volume: 24.544 N/A N/A (cm) Procedures Performed: Debridement N/A N/A Treatment Notes Wound #4 (Right Amputation Site - Below Knee) Notes mepitel one, Dakins soaked gauze, abd, kerlix secured with tape Electronic Signature(s) Signed: 02/22/2019 4:35:36 PM By: Linton Ham MD Entered By: Linton Ham on 02/22/2019 15:58:55 Joslyn, Louie Boston ([id number]) -------------------------------------------------------------------------------- Coffee  Details Patient Name: [name] Date of Service: 02/22/2019 3:00 PM Medical Record Number: [id number] Patient Account Number: [id number] Date of Birth/Sex: [birth year] (63 y.o. M) Treating RN: Cornell Barman Primary Care Jamyiah Labella: Park Liter Other Clinician: Referring Cliffie Gingras: Park Liter Treating Masaki Rothbauer/Extender: Tito Dine in Treatment: 3 Active Inactive Necrotic Tissue Nursing Diagnoses: Impaired tissue integrity related to necrotic/devitalized tissue Goals: Necrotic/devitalized tissue will be minimized in the wound bed Date Initiated: 02/01/2019 Target Resolution Date: 02/15/2019 Goal Status: Active Interventions: Assess patient pain level pre-, during and post procedure and prior to discharge Treatment Activities: Apply topical anesthetic as ordered : 02/01/2019 Notes: Orientation to the Wound Care Program Nursing Diagnoses: Knowledge deficit related to the wound healing center program Goals: Patient/caregiver will verbalize understanding of the Joanna Date Initiated: 02/01/2019 Target Resolution Date: 02/15/2019 Goal Status: Active Interventions: Provide education on orientation to the wound center Notes: Pain, Acute or Chronic Nursing Diagnoses: Pain Management - Non-cyclic Chronic Pain Pain, acute or chronic: actual or potential Goals: Patient will verbalize adequate pain control and receive pain control interventions during procedures as needed Date Initiated: 02/01/2019 Target Resolution Date: 02/08/2019 TARIQ, PERNELL ([id number]) Goal Status: Active Interventions: Complete pain assessment as per visit requirements Treatment Activities: Administer pain control measures as ordered : 02/01/2019 Notes: Wound/Skin Impairment Nursing Diagnoses: Knowledge deficit related to smoking impact on wound healing Goals: Patient/caregiver will verbalize understanding of skin care regimen Date Initiated: 02/01/2019 Target Resolution  Date: 02/08/2019 Goal Status: Active Ulcer/skin breakdown will have a volume reduction of 30% by week 4 Date Initiated: 02/01/2019 Target Resolution Date: 03/08/2019 Goal Status: Active Interventions: Assess ulceration(s) every visit Treatment Activities: Referred to DME Odena Mcquaid for dressing supplies : 02/01/2019 Skin care regimen initiated : 02/01/2019 Notes: Electronic Signature(s) Signed: 02/22/2019 4:45:53 PM By: Gretta Cool, BSN, RN, CWS, Kim RN, BSN Entered By: Gretta Cool, BSN, RN, CWS, Kim on 02/22/2019 15:11:46 Ambrosius, Louie Boston ([id number]) -------------------------------------------------------------------------------- Pain Assessment Details Patient Name: [name] Date of Service: 02/22/2019 3:00 PM Medical Record Number: [id number] Patient Account Number: [id number] Date of Birth/Sex: 03/26/1956 (63 y.o. M) Treating RN: Harold Barban Primary Care Rieley Hausman: Park Liter Other Clinician: Referring Kivon Aprea: Park Liter Treating Zailee Vallely/Extender: Tito Dine in Treatment: 3 Active Problems Location of Pain Severity and Description of Pain Patient Has Paino Yes Site Locations Rate the pain. Current Pain Level: 7 Pain Management and Medication Current Pain Management: Electronic Signature(s) Signed: 02/23/2019 3:44:22 PM By: Harold Barban Entered By: Harold Barban on 02/22/2019 14:51:36 Paule, Ovadia B. (  [id number]) -------------------------------------------------------------------------------- Patient/Caregiver Education Details Patient Name: MILIK, GILREATH B. Date of Service: 02/22/2019 3:00 PM Medical Record Number: [id number] Patient Account Number: [id number] Date of Birth/Gender: 02-Nov-1955 (63 y.o. M) Treating RN: Cornell Barman Primary Care Physician: Park Liter Other Clinician: Referring Physician: Park Liter Treating Physician/Extender: Tito Dine in Treatment: 3 Education Assessment Education Provided To: Patient Education  Topics Provided Wound/Skin Impairment: Handouts: Caring for Your Ulcer Methods: Demonstration, Explain/Verbal Responses: State content correctly Electronic Signature(s) Signed: 02/22/2019 4:45:53 PM By: Gretta Cool, BSN, RN, CWS, Kim RN, BSN Entered By: Gretta Cool, BSN, RN, CWS, Kim on 02/22/2019 15:24:14 Presas, Louie Boston ([id number]) -------------------------------------------------------------------------------- Wound Assessment Details Patient Name: [name] Date of Service: 02/22/2019 3:00 PM Medical Record Number: [id number] Patient Account Number: [id number] Date of Birth/Sex: [birth year] (63 y.o. 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 Wounding Event: Surgical Injury Etiology: Date Acquired: 01/31/2018 Wound Status: Open Weeks Of Treatment: 3 Comorbid Arrhythmia, Hypertension, Peripheral Arterial Clustered Wound: No History: Disease, Type II Diabetes, Dementia Pending Amputation On Presentation Photos Wound Measurements Length: (cm) 12.5 Width: (cm) 12.5 Depth: (cm) 0.2 Area: (cm) 122.718 Volume: (cm) 24.544 % Reduction in Area: 17.1% % Reduction in Volume: -65.8% Epithelialization: None Tunneling: No Undermining: No Wound Description Classification: Grade 2 Wound Margin: Flat and Intact Exudate Amount: Large Exudate Type: Serous Exudate Color: amber Foul Odor After Cleansing: No Slough/Fibrino Yes Wound Bed Granulation Amount: Small (1-33%) Exposed Structure Granulation Quality: Pink Fascia Exposed: No Necrotic Amount: Large (67-100%) Fat Layer (Subcutaneous Tissue) Exposed: Yes Necrotic Quality: Adherent Slough Tendon Exposed:  No Muscle Exposed: No Joint Exposed: No Bone Exposed: No Treatment Notes Finigan, Juwuan B. ([id number]) Wound #4 (Right Amputation Site - Below Knee) Notes mepitel one, Dakins soaked gauze, abd, kerlix secured with tape Electronic Signature(s) Signed: 02/23/2019 3:44:22 PM By: Harold Barban Entered By: Harold Barban on 02/22/2019 15:01:13 Hazelett, Louie Boston ([id number]) -------------------------------------------------------------------------------- Vitals Details Patient Name: [name] Date of Service: 02/22/2019 3:00 PM Medical Record Number: [id number] Patient Account Number: [id number] Date of Birth/Sex: 28-Mar-1956 (63 y.o. M) Treating RN: Harold Barban Primary Care Karanveer Ramakrishnan: Park Liter Other Clinician: Referring Sameera Betton: Park Liter Treating Kiele Heavrin/Extender: Tito Dine in Treatment: 3 Vital Signs Time Taken: 14:45 Temperature (F): 98.4 Height (in): 70 Pulse (bpm): 77 Weight (lbs): 175 Respiratory Rate (breaths/min): 18 Body Mass Index (BMI): 25.1 Blood Pressure (mmHg): 103/59 Reference Range: 80 - 120 mg / dl Electronic Signature(s) Signed: 02/23/2019 3:44:22 PM By: Harold Barban Entered By: Harold Barban on 02/22/2019 14:52:21

## 2019-02-24 DIAGNOSIS — R69 Illness, unspecified: Secondary | ICD-10-CM | POA: Diagnosis not present

## 2019-03-01 ENCOUNTER — Encounter: Payer: Medicare HMO | Admitting: Internal Medicine

## 2019-03-01 ENCOUNTER — Other Ambulatory Visit: Payer: Self-pay

## 2019-03-01 DIAGNOSIS — E11621 Type 2 diabetes mellitus with foot ulcer: Secondary | ICD-10-CM | POA: Diagnosis not present

## 2019-03-01 DIAGNOSIS — Z7901 Long term (current) use of anticoagulants: Secondary | ICD-10-CM | POA: Diagnosis not present

## 2019-03-01 DIAGNOSIS — E1151 Type 2 diabetes mellitus with diabetic peripheral angiopathy without gangrene: Secondary | ICD-10-CM | POA: Diagnosis not present

## 2019-03-01 DIAGNOSIS — I480 Paroxysmal atrial fibrillation: Secondary | ICD-10-CM | POA: Diagnosis not present

## 2019-03-01 DIAGNOSIS — T8789 Other complications of amputation stump: Secondary | ICD-10-CM | POA: Diagnosis not present

## 2019-03-01 DIAGNOSIS — D509 Iron deficiency anemia, unspecified: Secondary | ICD-10-CM | POA: Diagnosis not present

## 2019-03-01 DIAGNOSIS — L97811 Non-pressure chronic ulcer of other part of right lower leg limited to breakdown of skin: Secondary | ICD-10-CM | POA: Diagnosis not present

## 2019-03-01 DIAGNOSIS — Z89511 Acquired absence of right leg below knee: Secondary | ICD-10-CM | POA: Diagnosis not present

## 2019-03-02 DIAGNOSIS — Z89511 Acquired absence of right leg below knee: Secondary | ICD-10-CM | POA: Diagnosis not present

## 2019-03-02 DIAGNOSIS — E1151 Type 2 diabetes mellitus with diabetic peripheral angiopathy without gangrene: Secondary | ICD-10-CM | POA: Diagnosis not present

## 2019-03-02 DIAGNOSIS — E11622 Type 2 diabetes mellitus with other skin ulcer: Secondary | ICD-10-CM | POA: Diagnosis not present

## 2019-03-02 DIAGNOSIS — L97811 Non-pressure chronic ulcer of other part of right lower leg limited to breakdown of skin: Secondary | ICD-10-CM | POA: Diagnosis not present

## 2019-03-02 NOTE — Progress Notes (Signed)
EDWARDS, MCKELVIE ([id number]) Visit Report for 03/01/2019 HPI Details Patient Name: [name], [name]. Date of Service: 03/01/2019 3:00 PM Medical Record Number: [id number] Patient Account Number: [id number] Date of Birth/Sex: [birth year] (63 y.o. M) Treating RN: Harold Barban Primary Care Provider: Park Liter Other Clinician: Referring Provider: Park Liter Treating Provider/Extender: Tito Dine in Treatment: 4 History of Present Illness HPI Description: 03/03/18- He is here for initial evaluation of right toe wounds and tissue necrosis. He saw Dr. Vickki Muff on 7/23 where he was noted to have progressive ischemic changes to his toes. He was referred from Dr. Fellows office to the wound clinic for possible hyperbaric oxygen therapy. At this time, based on the information we have at hand, there is no diagnosis that would qualify him for hyperbaric oxygen therapy. He underwent an angiogram on 6/13 by Dr. 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 He did not have intervention on 6/13. arterial studies performed on 6/19 showed resting ABI to the 0.99 (right) and 1.09 (left). 03/10/18-He is seen in follow up evaluation. Plain film x-ray ordered last week revealed no evidence of acute osseous abnormality and dorsal soft tissue swelling of the midfoot. Culture that was obtained last week from the right great toe was negative. He has an appointment with Dr Lucky Cowboy on 8/23. 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 The right great toe with a  small, superficial, open area with resolution of erythema and improvement in edema; will continue with medihoney. He will follow up next week 03/17/18-He is seen in follow-up evaluation. He was able to reschedule his appointment with vascular medicine and was seen on 8/14. In office ABIs are similar to studies on 6/19, right toe waveform is blunted with known small vessel disease; they are recommending compression therapy which the patient is refusing to wear. 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 Vickki Muff. The patient's wife is going to try and move that appointment up to next week. The patient does exhibit right foot pain consistent with rest pain, relieved with dependent position. There is cyanotic discoloration to right toes 1-3 and along the lateral aspect and heel of the right foot. The wound to the dorsal aspect of the right great toe is larger, dry; we will initiate santyl. He can follow up in 1-2 weeks 04/15/18 on evaluation today patient's right foot ulcer in regard to the fourth and fifth toes really appears to be no better. In fact this is pretty much a complete region of gangrene that this point. He also has microvascular disease and the remaining toes and the discoloration noted today is extremely cyanotic blue/purple. He actually was supposed to have Artie had invitation but unfortunately this is been delayed because there is some question as to whether or not he may actually have a G.I. bleed due to being severely anemic. He's also apparently having some issues with his heart which is also delayed the process at this point. Unfortunately in general nothing seems to be improving if anything his wife tells me that this seems to be getting worse. 05/09/18 on evaluation today patient actually appears  to be doing worse in regard to the gangrene of his right foot. This is actually spread to all toes and actually up a portion of his foot as well. He has been referred by  podiatry to Dr. dew, vascular surgery, in order to evaluate for a below the knee amputation. Nonetheless the patient is having some discomfort but his biggest concern that he seems to be stuck on more than anything is how far up Dr. dew is going to have to take his leg off. This is a great concern for him. In fact he asked me the same question at least 4-5 times during evaluation today ADMISSION 02/01/2019 This is now a 62 year old man with type 2 diabetes and known PAD. 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 According to the history that his wife is giving [place] that the wound held together when the sutures were removed however gradually this opened and expanded. He was taken to the OR on 08/08/2018 for debridement. He had a wound VAC on this for several months which apparently resulted in good improvement with the wound but the patient states Eckenrode, Arlee B. ([id number]) he could not tolerate it. Currently they are using Xeroform ABDs and gauze. They have been followed extensively vein and vascular by Dr. dew service. The patient had an angiogram on 01/13/2018 which showed a normal CFA, PFA and stents in both iliac arteries were patent. These were placed in March 2019. The patient had an MRI of the stump with contrast in December 2019. This suggested inflammatory changes within the anterior soft tissue of the remaining lower leg status post below-knee amputation there was some suggestion of a sinus tract. Adjacent to the soft tissue change there was marrow T2 hyperdensity and low-level enhancement which could reflect osteomyelitis. At this point I am not sure what was done about this report I will need to look back through the records. Past medical history; type 2 diabetes with known PAD, paroxysmal A. fib on Eliquis, iron deficiency anemia, hyperlipidemia, left occipital CVA, right external iliac stent 3/19. 7/8; the patient apparently tolerated the Dakin's  wet-to-dry twice daily until this morning and then they put Xeroform on it. Area I debrided last week looks quite good. 7/15; using Dakin's wet-to-dry. Wound is coming along from debridement point of view. He still uses tramadol for pain and I have given him a prescription for this today 7/22 he is using Dakin's wet-to-dry. The wound surface continues to be a bit of a challenge and there really is not any epithelialization. He tolerates debridements even vigorous washing of the wound very poorly. I have talked to him about stump revisions which could be either below-knee if it is felt that he has enough blood flow to this area or above-knee. He is really opposed to any thought of above-knee amputation. 7/29; he comes in today saying that the Dakin's wet-to-dry was painful and that he changed it to Xeroform. The wound surface was not viable although fortunately most of this seems to wipe off with saline soaked gauze. Electronic Signature(s) Signed: 03/01/2019 5:12:30 PM By: Linton Ham MD Entered By: Linton Ham on 03/01/2019 16:46:42 Rosenbach, Louie Boston ([id number]) -------------------------------------------------------------------------------- Physical Exam Details Patient Name: [name] Date of Service: 03/01/2019 3:00 PM Medical Record Number: [id number] Patient Account Number: [id number] Date of Birth/Sex: 05-28-1956 (63 y.o. M) Treating RN: Harold Barban Primary Care Provider: Park Liter Other Clinician: Referring Provider: Park Liter Treating Provider/Extender: Ricard Dillon  Weeks in Treatment: 4 Constitutional Sitting or standing Blood Pressure is within target range for patient.. Pulse regular and within target range for patient.Marland Kitchen Respirations regular, non-labored and within target range.. Temperature is normal and within the target range for the patient.Marland Kitchen appears in no distress. Eyes Conjunctivae clear. No discharge. Respiratory Respiratory effort is  easy and symmetric bilaterally. Rate is normal at rest and on room air.. Cardiovascular Popliteal pulse on the right is palpable as is the femoral. Lymphatic None palpable in the popliteal or inguinal area. Integumentary (Hair, Skin) I do not think he needs antibiotics his wife is concerned about slight pinkish discoloration of the skin above the wound area. This may be drainage issues or Xeroform. Psychiatric No evidence of depression, anxiety, or agitation. Calm, cooperative, and communicative. Appropriate interactions and affect.. Notes Wound exam; disappointing appearance of this.stump with copious amounts of surface slough. A lot of this wipes off with Anasept and gauze but some of it is more adherent. I did not do a mechanical debridement today. There was some pink discoloration superiorly but there is no tenderness I do not think this is cellulitis. Electronic Signature(s) Signed: 03/01/2019 5:12:30 PM By: Linton Ham MD Entered By: Linton Ham on 03/01/2019 16:48:24 Jorden, Louie Boston ([id number]) -------------------------------------------------------------------------------- Physician Orders Details Patient Name: [name] Date of Service: 03/01/2019 3:00 PM Medical Record Number: [id number] Patient Account Number: [id number] Date of Birth/Sex: 1955/09/17 (63 y.o. M) Treating RN: Harold Barban Primary Care Provider: Park Liter Other Clinician: Referring Provider: Park Liter Treating Provider/Extender: Tito Dine in Treatment: 4 Verbal / Phone Orders: No Diagnosis Coding Wound Cleansing Wound #4 Right Amputation Site - Below Knee o Cleanse wound with mild soap and water Anesthetic (add to Medication List) Wound #4 Right Amputation Site - Below Knee o Topical Lidocaine 4% cream applied to wound bed prior to debridement (In Clinic Only). o Benzocaine Topical Anesthetic Spray applied to wound bed prior to debridement (In Clinic  Only). Primary Wound Dressing Wound #4 Right Amputation Site - Below Knee o Hydrafera Blue Ready Transfer Secondary Dressing Wound #4 Right Amputation Site - Below Knee o ABD and Kerlix/Conform Dressing Change Frequency Wound #4 Right Amputation Site - Below Knee o Change dressing every other day. Follow-up Appointments Wound #4 Right Amputation Site - Below Knee o Return Appointment in 1 week. Edema Control Wound #4 Right Amputation Site - Below Knee o Elevate legs to the level of the heart and pump ankles as often as possible Additional Orders / Instructions Wound #4 Right Amputation Site - Below Knee o Activity as tolerated Medications-please add to medication list. Wound #4 Right Amputation Site - Below Knee o Other: - Tramadol refilled at last visit Electronic Signature(s) Signed: 03/01/2019 4:53:39 PM By: Domenic Schwab ([id number]) Signed: 03/01/2019 5:12:30 PM By: Linton Ham MD Entered By: Harold Barban on 03/01/2019 15:58:50 Brandle, Louie Boston ([id number]) -------------------------------------------------------------------------------- Problem List Details Patient Name: [name] Date of Service: 03/01/2019 3:00 PM Medical Record Number: [id number] Patient Account Number: [id number] Date of Birth/Sex: [birth year] (63 y.o. M) Treating RN: Harold Barban Primary Care Provider: Park Liter Other Clinician: Referring Provider: Park Liter Treating Provider/Extender: Tito Dine in Treatment: 4 Active Problems ICD-10 Evaluated Encounter Code Description Active Date Today Diagnosis E11.51 Type 2 diabetes mellitus with diabetic peripheral angiopathy 02/01/2019 No Yes without gangrene 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 02/01/2019 No Yes limited to breakdown of skin E11.622 Type 2 diabetes mellitus with  other skin ulcer 02/01/2019 No Yes Z89.511 Acquired absence of right leg below  knee 02/01/2019 No Yes Inactive Problems Resolved Problems Electronic Signature(s) Signed: 03/01/2019 5:12:30 PM By: Linton Ham MD Entered By: Linton Ham on 03/01/2019 16:44:41 Antunes, Louie Boston ([id number]) -------------------------------------------------------------------------------- Progress Note Details Patient Name: [name] Date of Service: 03/01/2019 3:00 PM Medical Record Number: [id number] Patient Account Number: [id number] Date of Birth/Sex: [birth year] (63 y.o. M) Treating RN: Harold Barban Primary Care Provider: Park Liter Other Clinician: Referring Provider: Park Liter Treating Provider/Extender: Tito Dine in Treatment: 4 Subjective History of Present Illness (HPI) 03/03/18- He is here for initial evaluation of right toe wounds and tissue necrosis. He saw Dr. Vickki Muff on 7/23 where he was noted to have progressive ischemic changes to his toes. He was referred from Dr. Fellows office to the wound clinic for possible hyperbaric oxygen therapy. At this time, based on the information we have at hand, there is no diagnosis that would qualify him for hyperbaric oxygen therapy. He underwent an angiogram on 6/13 by Dr. 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 He did not have intervention on 6/13. arterial studies performed on 6/19 showed resting ABI to the 0.99 (right) and 1.09 (left). 03/10/18-He is seen in follow up evaluation. Plain film x-ray ordered last week revealed no evidence of acute osseous abnormality and dorsal soft  tissue swelling of the midfoot. Culture that was obtained last week from the right great toe was negative. He has an appointment with Dr Lucky Cowboy on 8/23. 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 The right great toe with a small, superficial, open area with resolution of erythema and improvement in edema; will continue with medihoney. He will follow up next week 03/17/18-He is seen in follow-up evaluation. He was able to reschedule his appointment with vascular medicine and was seen on 8/14. In office ABIs are similar to studies on 6/19, right toe waveform is blunted with known small vessel disease; they are recommending compression therapy which the patient is refusing to wear. 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 Vickki Muff. The patient's wife is going to try and move that appointment up to next week. The patient does exhibit right foot pain consistent with rest pain, relieved with dependent position. There is cyanotic discoloration to right toes 1-3 and along the lateral aspect and heel of the right foot. The wound to the dorsal aspect of the right great toe is larger, dry; we will initiate santyl. He can follow up in 1-2 weeks 04/15/18 on evaluation today patient's right foot ulcer in regard to the fourth and fifth toes really appears to be no better. In fact this is pretty much a complete region of gangrene that this point. He also has microvascular disease and the remaining toes and the discoloration noted today is extremely cyanotic blue/purple. He actually was supposed to have Artie had invitation but unfortunately this is been delayed because there is some question as to whether or not he may actually have a G.I. bleed due to being severely anemic. He's also apparently having some issues with his heart  which is also delayed the process at this point. Unfortunately in general nothing seems to be improving if anything his wife tells me that  this seems to be getting worse. 05/09/18 on evaluation today patient actually appears to be doing worse in regard to the gangrene of his right foot. This is actually spread to all toes and actually up a portion of his foot as well. He has been referred by podiatry to Dr. dew, vascular surgery, in order to evaluate for a below the knee amputation. Nonetheless the patient is having some discomfort but his biggest concern that he seems to be stuck on more than anything is how far up Dr. dew is going to have to take his leg off. This is a great concern for him. In fact he asked me the same question at least 4-5 times during evaluation today ADMISSION 02/01/2019 This is now a 63 year old man with type 2 diabetes and known PAD. 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 According to the history that his wife is giving [place] that the wound held together when the sutures were removed however gradually this opened and expanded. He was taken to the OR on 08/08/2018 for debridement. He had a wound VAC on this for several months which apparently resulted in good improvement with the wound but the patient states he could not tolerate it. Currently they are using Xeroform ABDs and gauze. They have been followed extensively vein and vascular by Dr. dew service. COREYON, NICOTRA ([id number]) The patient had an angiogram on 01/13/2018 which showed a normal CFA, PFA and stents in both iliac arteries were patent. These were placed in March 2019. The patient had an MRI of the stump with contrast in December 2019. This suggested inflammatory changes within the anterior soft tissue of the remaining lower leg status post below-knee amputation there was some suggestion of a sinus tract. Adjacent to the soft tissue change there was marrow T2 hyperdensity and low-level enhancement which could reflect osteomyelitis. At this point I am not sure what was done about this report I will need to  look back through the records. Past medical history; type 2 diabetes with known PAD, paroxysmal A. fib on Eliquis, iron deficiency anemia, hyperlipidemia, left occipital CVA, right external iliac stent 3/19. 7/8; the patient apparently tolerated the Dakin's wet-to-dry twice daily until this morning and then they put Xeroform on it. Area I debrided last week looks quite good. 7/15; using Dakin's wet-to-dry. Wound is coming along from debridement point of view. He still uses tramadol for pain and I have given him a prescription for this today 7/22 he is using Dakin's wet-to-dry. The wound surface continues to be a bit of a challenge and there really is not any epithelialization. He tolerates debridements even vigorous washing of the wound very poorly. I have talked to him about stump revisions which could be either below-knee if it is felt that he has enough blood flow to this area or above-knee. He is really opposed to any thought of above-knee amputation. 7/29; he comes in today saying that the Dakin's wet-to-dry was painful and that he changed it to Xeroform. The wound surface was not viable although fortunately most of this seems to wipe off with saline soaked gauze. Objective Constitutional Sitting or standing Blood Pressure is within target range for patient.. Pulse regular and within target range for patient.Marland Kitchen Respirations regular, non-labored and within target range.. Temperature is normal and within the  target range for the patient.Marland Kitchen appears in no distress. Vitals Time Taken: 3:06 PM, Height: 70 in, Weight: 175 lbs, BMI: 25.1, Temperature: 98.8 F, Pulse: 81 bpm, Respiratory Rate: 18 breaths/min, Blood Pressure: 106/59 mmHg. Eyes Conjunctivae clear. No discharge. Respiratory Respiratory effort is easy and symmetric bilaterally. Rate is normal at rest and on room air.. Cardiovascular Popliteal pulse on the right is palpable as is the femoral. Lymphatic None palpable in the  popliteal or inguinal area. Psychiatric No evidence of depression, anxiety, or agitation. Calm, cooperative, and communicative. Appropriate interactions and affect.. General Notes: Wound exam; disappointing appearance of this.stump with copious amounts of surface slough. A lot of this wipes off with Anasept and gauze but some of it is more adherent. I did not do a mechanical debridement today. There was Tocco, Seddrick B. ([id number]) some pink discoloration superiorly but there is no tenderness I do not think this is cellulitis. Integumentary (Hair, Skin) I do not think he needs antibiotics his wife is concerned about slight pinkish discoloration of the skin above the wound area. This may be drainage issues or Xeroform. Wound #4 status is Open. Original cause of wound was Surgical Injury. The wound is located on the Right Amputation Site - Below Knee. The wound measures 12.3cm length x 12cm width x 0.1cm depth; 115.925cm^2 area and 11.592cm^3 volume. There is Fat Layer (Subcutaneous Tissue) Exposed exposed. There is no tunneling or undermining noted. There is a large amount of serous drainage noted. The wound margin is flat and intact. There is medium (34-66%) pink granulation within the wound bed. There is a medium (34-66%) amount of necrotic tissue within the wound bed including Adherent Slough. Assessment Active Problems ICD-10 Type 2 diabetes mellitus with diabetic peripheral angiopathy without gangrene Non-pressure chronic ulcer of other part of right lower leg limited to breakdown of skin Type 2 diabetes mellitus with other skin ulcer Acquired absence of right leg below knee Plan Wound Cleansing: Wound #4 Right Amputation Site - Below Knee: Cleanse wound with mild soap and water Anesthetic (add to Medication List): Wound #4 Right Amputation Site - Below Knee: Topical Lidocaine 4% cream applied to wound bed prior to debridement (In Clinic Only). Benzocaine Topical Anesthetic Spray  applied to wound bed prior to debridement (In Clinic Only). Primary Wound Dressing: Wound #4 Right Amputation Site - Below Knee: Hydrafera Blue Ready Transfer Secondary Dressing: Wound #4 Right Amputation Site - Below Knee: ABD and Kerlix/Conform Dressing Change Frequency: Wound #4 Right Amputation Site - Below Knee: Change dressing every other day. 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 Edema Control: Wound #4 Right Amputation Site - Below Knee: Elevate legs to the level of the heart and pump ankles as often as possible Additional Orders / Instructions: Wound #4 Right Amputation Site - Below Knee: Activity as tolerated Medications-please add to medication list.: TERIUS, JACUINDE B. ([id number]) Wound #4 Right Amputation Site - Below Knee: Other: - Tramadol refilled at last visit 1. I change the primary dressing to Hydrofera Blue ABDs and gauze. He is going to have to make this last more than a day in order for [place] to be able to get enough material to dress this 3 times a week. 2. Hopefully he will leave this in place. His wife says that he gets anxious and wants to change the dressing. 3. My thought is that he has enough blood flow to the stump. He has been followed aggressively by Dr. dew service for  a long period of time. I think at one point had a skin graft on this. Electronic Signature(s) Signed: 03/01/2019 5:12:30 PM By: Linton Ham MD Entered By: Linton Ham on 03/01/2019 16:52:23 Muhlestein, Louie Boston ([id number]) -------------------------------------------------------------------------------- SuperBill Details Patient Name: [name] Date of Service: 03/01/2019 Medical Record Number: [id number] Patient Account Number: [id number] Date of Birth/Sex: [birth year] (63 y.o. M) Treating RN: Harold Barban Primary Care Provider: Park Liter Other Clinician: Referring Provider: Park Liter Treating  Provider/Extender: Tito Dine in Treatment: 4 Diagnosis Coding ICD-10 Codes Code Description E11.51 Type 2 diabetes mellitus with diabetic peripheral angiopathy without gangrene 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 limited to breakdown of skin E11.622 Type 2 diabetes mellitus with other skin ulcer Z89.511 Acquired absence of right leg below knee Facility Procedures CPT4 Code: [id number] Description: 00867 - WOUND CARE VISIT-LEV 2 EST PT Modifier: Quantity: 1 Physician Procedures CPT4 Code Description: [id number] - WC PHYS LEVEL 3 - EST PT ICD-10 Diagnosis Description 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 lim E11.51 Type 2 diabetes mellitus with diabetic peripheral angiopathy wi Modifier: ited to breakdown thout gangrene Quantity: 1 of skin Electronic Signature(s) Signed: 03/01/2019 5:12:30 PM By: Linton Ham MD Entered By: Linton Ham on 03/01/2019 16:52:43

## 2019-03-02 NOTE — Progress Notes (Signed)
EXAVIOR, KIMMONS ([id number]) Visit Report for 03/01/2019 Arrival Information Details Patient Name: [name], [name]. Date of Service: 03/01/2019 3:00 PM Medical Record Number: [id number] Patient Account Number: [id number] Date of Birth/Sex: Mar 19, 1956 (63 y.o. M) Treating RN: Montey Hora Primary Care Yuri Flener: Park Liter Other Clinician: Referring Thana Ramp: Park Liter Treating Tinleigh Whitmire/Extender: Tito Dine in Treatment: 4 Visit Information History Since Last Visit Added or deleted any medications: No Patient Arrived: Wheel Chair Any new allergies or adverse reactions: No Arrival Time: 15:05 Had a fall or experienced change in No Accompanied By: self activities of daily living that may affect Transfer Assistance: Manual risk of falls: Patient Identification Verified: Yes Signs or symptoms of abuse/neglect since last visito No Secondary Verification Process Yes Hospitalized since last visit: No Completed: Implantable device outside of the clinic excluding No Patient Has Alerts: Yes cellular tissue based products placed in the center Patient Alerts: Patient on Blood since last visit: Thinner Has Dressing in Place as Prescribed: Yes DMII Pain Present Now: Yes Eliquis Electronic Signature(s) Signed: 03/01/2019 4:41:53 PM By: Montey Hora Entered By: Montey Hora on 03/01/2019 15:06:18 [place], [name] ([id number]) -------------------------------------------------------------------------------- Clinic Level of Care Assessment Details Patient Name: Glenn Standard B. Date of Service: 03/01/2019 3:00 PM Medical Record Number: [id number] Patient Account Number: [id number] Date of Birth/Sex: Jun 04, 1956 (63 y.o. M) Treating RN: Harold Barban Primary Care Jessey Huyett: Park Liter Other Clinician: Referring Jamail Cullers: Park Liter Treating Hendryx Ricke/Extender: Tito Dine in Treatment: 4 Clinic Level of Care Assessment Items TOOL 4 Quantity  Score []  - Use when only an EandM is performed on FOLLOW-UP visit 0 ASSESSMENTS - Nursing Assessment / Reassessment X - Reassessment of Co-morbidities (includes updates in patient status) 1 10 X- 1 5 Reassessment of Adherence to Treatment Plan ASSESSMENTS - Wound and Skin Assessment / Reassessment X - Simple Wound Assessment / Reassessment - one wound 1 5 []  - 0 Complex Wound Assessment / Reassessment - multiple wounds []  - 0 Dermatologic / Skin Assessment (not related to wound area) ASSESSMENTS - Focused Assessment []  - Circumferential Edema Measurements - multi extremities 0 []  - 0 Nutritional Assessment / Counseling / Intervention []  - 0 Lower Extremity Assessment (monofilament, tuning fork, pulses) []  - 0 Peripheral Arterial Disease Assessment (using hand held doppler) ASSESSMENTS - Ostomy and/or Continence Assessment and Care []  - Incontinence Assessment and Management 0 []  - 0 Ostomy Care Assessment and Management (repouching, etc.) PROCESS - Coordination of Care X - Simple Patient / Family Education for ongoing care 1 15 []  - 0 Complex (extensive) Patient / Family Education for ongoing care []  - 0 Staff obtains Programmer, systems, Records, Test Results / Process Orders []  - 0 Staff telephones HHA, Nursing Homes / Clarify orders / etc []  - 0 Routine Transfer to another Facility (non-emergent condition) []  - 0 Routine Hospital Admission (non-emergent condition) []  - 0 New Admissions / Biomedical engineer / Ordering NPWT, Apligraf, etc. []  - 0 Emergency Hospital Admission (emergent condition) X- 1 10 Simple Discharge Coordination [name], [name] ([id number]) []  - 0 Complex (extensive) Discharge Coordination PROCESS - Special Needs []  - Pediatric / Minor Patient Management 0 []  - 0 Isolation Patient Management []  - 0 Hearing / Language / Visual special needs []  - 0 Assessment of Community assistance (transportation, D/C planning, etc.) []  - 0 Additional assistance  / Altered mentation []  - 0 Support Surface(s) Assessment (bed, cushion, seat, etc.) INTERVENTIONS - Wound Cleansing / Measurement X - Simple Wound Cleansing - one wound 1 5 []  -  0 Complex Wound Cleansing - multiple wounds X- 1 5 Wound Imaging (photographs - any number of wounds) []  - 0 Wound Tracing (instead of photographs) X- 1 5 Simple Wound Measurement - one wound []  - 0 Complex Wound Measurement - multiple wounds INTERVENTIONS - Wound Dressings X - Small Wound Dressing one or multiple wounds 1 10 []  - 0 Medium Wound Dressing one or multiple wounds []  - 0 Large Wound Dressing one or multiple wounds []  - 0 Application of Medications - topical []  - 0 Application of Medications - injection INTERVENTIONS - Miscellaneous []  - External ear exam 0 []  - 0 Specimen Collection (cultures, biopsies, blood, body fluids, etc.) []  - 0 Specimen(s) / Culture(s) sent or taken to Lab for analysis []  - 0 Patient Transfer (multiple staff / Civil Service fast streamer / Similar devices) []  - 0 Simple Staple / Suture removal (25 or less) []  - 0 Complex Staple / Suture removal (26 or more) []  - 0 Hypo / Hyperglycemic Management (close monitor of Blood Glucose) []  - 0 Ankle / Brachial Index (ABI) - do not check if billed separately X- 1 5 Vital Signs [name], [name] ([id number]) Has the patient been seen at the hospital within the last three years: Yes Total Score: 75 Level Of Care: New/Established - Level 2 Electronic Signature(s) Signed: 03/01/2019 4:53:39 PM By: Harold Barban Entered By: Harold Barban on 03/01/2019 15:50:35 [name], [name] ([id number]) -------------------------------------------------------------------------------- Encounter Discharge Information Details Patient Name: Glenn Standard B. Date of Service: 03/01/2019 3:00 PM Medical Record Number: [id number] Patient Account Number: [id number] Date of Birth/Sex: [birth year] (63 y.o. M) Treating RN: Army Melia Primary Care  Sidharth Leverette: Park Liter Other Clinician: Referring Rorey Hodges: Park Liter Treating Elder Davidian/Extender: Tito Dine in Treatment: 4 Encounter Discharge Information Items Discharge Condition: Stable Ambulatory Status: Walker Discharge Destination: Home Transportation: Private Auto Accompanied By: wife Schedule Follow-up Appointment: Yes Clinical Summary of Care: Electronic Signature(s) Signed: 03/01/2019 4:31:27 PM By: Army Melia Entered By: Army Melia on 03/01/2019 16:04:05 Allum, [name] ([id number]) -------------------------------------------------------------------------------- Lower Extremity Assessment Details Patient Name: Glenn Standard B. Date of Service: 03/01/2019 3:00 PM Medical Record Number: [id number] Patient Account Number: [id number] Date of Birth/Sex: April 29, 1956 (63 y.o. M) Treating RN: Montey Hora Primary Care Jaxxson Cavanah: Park Liter Other Clinician: Referring Genea Rheaume: Park Liter Treating Tilman Mcclaren/Extender: Ricard Dillon Weeks in Treatment: 4 Vascular Assessment Pulses: Popliteal Palpable: [Right:Yes] Electronic Signature(s) Signed: 03/01/2019 4:41:53 PM By: Montey Hora Entered By: Montey Hora on 03/01/2019 15:15:02 Dismore, [name] ([id number]) -------------------------------------------------------------------------------- Multi Wound Chart Details Patient Name: Glenn Standard B. Date of Service: 03/01/2019 3:00 PM Medical Record Number: [id number] Patient Account Number: [id number] Date of Birth/Sex: [birth year] (63 y.o. 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 N/A N/A Comorbid History: Arrhythmia, Hypertension, N/A N/A Peripheral Arterial Disease, Type II Diabetes, Dementia Date Acquired: 01/31/2018 N/A N/A Weeks of Treatment: 4 N/A N/A Wound Status: Open N/A N/A Pending Amputation on Yes N/A N/A Presentation: Measurements L x W x D 12.3x12x0.1 N/A N/A (cm) Area (cm) : 115.925 N/A N/A Volume (cm) : 11.592 N/A N/A % Reduction in  Area: 21.70% N/A N/A % Reduction in Volume: 21.70% N/A N/A Classification: Grade 2 N/A N/A Exudate Amount: Large N/A N/A Exudate Type: Serous N/A N/A Exudate Color: amber N/A N/A Wound Margin: Flat and Intact N/A N/A Granulation Amount: Medium (34-66%) N/A N/A Granulation Quality: Pink N/A N/A Necrotic Amount: Medium (34-66%) N/A N/A Exposed Structures: N/A N/A ANCIL, DEWAN B. ([id number]) Fat Layer (Subcutaneous Tissue) Exposed: Yes Fascia: No Tendon: No Muscle: No Joint: No Bone: No Epithelialization: None N/A N/A Treatment Notes Wound #4 (Right Amputation Site - Below Knee) Notes hydrofera blue, abd, kerlix secured with tape Electronic Signature(s) Signed: 03/01/2019 5:12:30 PM By: Linton Ham MD Entered By: Linton Ham on 03/01/2019 16:45:50 Altschuler, [name] ([id number]) -------------------------------------------------------------------------------- Multi-Disciplinary Care Plan Details Patient Name: Glenn Standard B. Date of Service: 03/01/2019 3:00 PM Medical Record Number: [id number] Patient Account Number: [id number] Date of Birth/Sex: [birth year] (63 y.o. M) Treating RN: Harold Barban Primary Care Iva Montelongo: Park Liter Other Clinician: Referring Billal Rollo: Park Liter Treating Sims Laday/Extender: Tito Dine in Treatment: 4 Active Inactive Necrotic  Tissue Nursing Diagnoses: Impaired tissue integrity related to necrotic/devitalized tissue Goals: Necrotic/devitalized tissue will be minimized in the wound bed Date Initiated: 02/01/2019 Target Resolution Date: 02/15/2019 Goal Status: Active Interventions: Assess patient pain level pre-, during and post procedure and prior to discharge Treatment Activities: Apply topical anesthetic as ordered : 02/01/2019 Notes: Orientation to the Wound Care Program Nursing Diagnoses: Knowledge deficit related to the wound healing center program Goals: Patient/caregiver will verbalize understanding of the Central Date Initiated: 02/01/2019 Target Resolution Date: 02/15/2019 Goal Status: Active Interventions: Provide education on orientation to the wound center Notes: Pain, Acute or Chronic Nursing Diagnoses: Pain Management - Non-cyclic Chronic Pain Pain, acute or chronic: actual or potential Goals: Patient will verbalize adequate pain control and receive pain control interventions during procedures as needed Date Initiated: 02/01/2019 Target Resolution Date: 02/08/2019 CANDON, CARAS ([id number]) Goal Status: Active Interventions: Complete pain assessment as per visit requirements Treatment Activities: Administer pain control measures as ordered : 02/01/2019 Notes: Wound/Skin Impairment Nursing Diagnoses: Knowledge deficit related to smoking impact on wound healing Goals: Patient/caregiver will verbalize understanding of skin care regimen Date Initiated: 02/01/2019 Target Resolution Date: 02/08/2019 Goal Status: Active Ulcer/skin breakdown will have a volume reduction of 30% by week 4 Date Initiated: 02/01/2019 Target Resolution Date: 03/08/2019 Goal Status: Active Interventions: Assess ulceration(s) every visit Treatment Activities: Referred to DME Barbar Brede for dressing supplies : 02/01/2019 Skin care regimen initiated : 02/01/2019 Notes: Electronic Signature(s) Signed:  03/01/2019 4:53:39 PM By: Harold Barban Entered By: Harold Barban on 03/01/2019 15:43:20 Javed, [name] ([id number]) -------------------------------------------------------------------------------- Pain Assessment Details Patient Name: Glenn Standard B. Date of Service: 03/01/2019 3:00 PM Medical Record Number: [id number] Patient Account Number: [id number] Date of Birth/Sex: 28-Feb-1956 (63 y.o. M) Treating RN: Montey Hora Primary Care Keayra Graham: Park Liter Other Clinician: Referring Purity Irmen: Park Liter Treating Emauri Krygier/Extender: Tito Dine in Treatment: 4 Active Problems Location of Pain Severity and Description of Pain Patient Has Paino Yes Site Locations Pain Location: Pain in Ulcers With Dressing Change: Yes Duration of the Pain. Constant / Intermittento Constant Rate the pain. Current Pain Level: 5 Character of Pain Describe the Pain: Aching Pain Management and Medication Current Pain Management: Electronic Signature(s) Signed: 03/01/2019 4:41:53 PM By: Montey Hora Entered By: Montey Hora on 03/01/2019 15:06:34 Harten, [name] ([id number]) -------------------------------------------------------------------------------- Patient/Caregiver Education Details Patient Name: Glenn Standard B. Date of Service: 03/01/2019 3:00 PM Medical Record Number: [id number] Patient Account Number: [id number]  Date of Birth/Gender: 01/20/1956 (63 y.o. M) Treating RN: Harold Barban Primary Care Physician: Park Liter Other Clinician: Referring Physician: Park Liter Treating Physician/Extender: Tito Dine in Treatment: 4 Education Assessment Education Provided To: Patient Education Topics Provided Wound/Skin Impairment: Handouts: Caring for Your Ulcer Methods: Demonstration, Explain/Verbal Responses: State content correctly Electronic Signature(s) Signed: 03/01/2019 4:53:39 PM By: Harold Barban Entered By: Harold Barban on  03/01/2019 15:45:36 Imran, [name] ([id number]) -------------------------------------------------------------------------------- Wound Assessment Details Patient Name: Glenn Standard B. Date of Service: 03/01/2019 3:00 PM Medical Record Number: [id number] Patient Account Number: [id number] Date of Birth/Sex: [birth year] (63 y.o. 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 Wounding Event: Surgical Injury Etiology: Date Acquired: 01/31/2018 Wound Status: Open Weeks Of Treatment: 4 Comorbid Arrhythmia, Hypertension, Peripheral Arterial Clustered Wound: No History: Disease, Type II Diabetes, Dementia Pending Amputation On Presentation Photos Wound Measurements Length: (cm) 12.3 Width: (cm) 12 Depth: (cm) 0.1 Area: (cm) 115.925 Volume: (cm) 11.592 % Reduction in Area: 21.7% % Reduction in Volume: 21.7% Epithelialization: None Tunneling: No Undermining: No Wound Description Classification: Grade 2 Wound Margin: Flat and Intact Exudate Amount: Large Exudate Type: Serous Exudate Color: amber Foul Odor After Cleansing: No Slough/Fibrino Yes Wound Bed Granulation Amount: Medium (34-66%) Exposed Structure Granulation Quality: Pink Fascia Exposed: No Necrotic Amount: Medium (34-66%) Fat Layer (Subcutaneous Tissue) Exposed: Yes Necrotic Quality: Adherent Slough Tendon Exposed: No Muscle Exposed: No Joint Exposed: No Bone Exposed: No Treatment Notes Rea, Hartman B. ([id number]) Wound #4 (Right Amputation Site - Below Knee) Notes hydrofera blue, abd, kerlix secured with tape Electronic Signature(s) Signed: 03/01/2019 4:41:53 PM By: Montey Hora Entered By: Montey Hora on 03/01/2019 15:14:46 Saulnier, [name] ([id number]) -------------------------------------------------------------------------------- Vitals Details Patient Name: Glenn Standard B. Date of Service: 03/01/2019 3:00 PM Medical Record Number: [id number] Patient Account Number: [id number] Date of Birth/Sex: 01-23-1956 (63 y.o. M) Treating RN: Montey Hora Primary Care Katelynne Revak: Park Liter Other Clinician: Referring Abdulahi Schor: Park Liter Treating Taylon Coole/Extender: Tito Dine in Treatment: 4 Vital Signs Time Taken: 15:06 Temperature (F): 98.8 Height (in): 70 Pulse (bpm): 81 Weight (lbs): 175 Respiratory Rate (breaths/min): 18 Body Mass Index (BMI): 25.1 Blood Pressure (mmHg): 106/59 Reference Range: 80 - 120 mg / dl Electronic Signature(s) Signed: 03/01/2019 4:41:53 PM By: Montey Hora Entered By: Montey Hora on 03/01/2019 15:08:25

## 2019-03-06 ENCOUNTER — Other Ambulatory Visit: Payer: Self-pay | Admitting: Oncology

## 2019-03-07 DIAGNOSIS — S98921A Partial traumatic amputation of right foot, level unspecified, initial encounter: Secondary | ICD-10-CM | POA: Diagnosis not present

## 2019-03-07 DIAGNOSIS — Z89511 Acquired absence of right leg below knee: Secondary | ICD-10-CM | POA: Diagnosis not present

## 2019-03-08 ENCOUNTER — Other Ambulatory Visit: Payer: Self-pay

## 2019-03-08 ENCOUNTER — Encounter: Payer: Medicare HMO | Attending: Internal Medicine | Admitting: Internal Medicine

## 2019-03-08 DIAGNOSIS — Z89511 Acquired absence of right leg below knee: Secondary | ICD-10-CM | POA: Diagnosis not present

## 2019-03-08 DIAGNOSIS — E1151 Type 2 diabetes mellitus with diabetic peripheral angiopathy without gangrene: Secondary | ICD-10-CM | POA: Insufficient documentation

## 2019-03-08 DIAGNOSIS — E11622 Type 2 diabetes mellitus with other skin ulcer: Secondary | ICD-10-CM | POA: Insufficient documentation

## 2019-03-08 DIAGNOSIS — T8189XA Other complications of procedures, not elsewhere classified, initial encounter: Secondary | ICD-10-CM | POA: Diagnosis not present

## 2019-03-08 DIAGNOSIS — L97811 Non-pressure chronic ulcer of other part of right lower leg limited to breakdown of skin: Secondary | ICD-10-CM | POA: Diagnosis not present

## 2019-03-08 NOTE — Progress Notes (Addendum)
[name], [name] ([id number]) Visit Report for 03/08/2019 HPI Details Patient Name: [name], [name]. Date of Service: 03/08/2019 3:15 PM Medical Record Number: [id number] Patient Account Number: [id number] Date of Birth/Sex: 1955/08/19 (63 y.o. M) Treating RN: Cornell Barman Primary Care Provider: Park Liter Other Clinician: Referring Provider: Park Liter Treating Provider/Extender: Beverly Gust in Treatment: 5 History of Present Illness HPI Description: 03/03/18- He is here for initial evaluation of right toe wounds and tissue necrosis. He saw Dr. Vickki Muff on 7/23 where he was noted to have progressive ischemic changes to his toes. He was referred from Dr. Fellows office to the wound clinic for possible hyperbaric oxygen therapy. At this time, based on the information we have at hand, there is no diagnosis that would qualify him for hyperbaric oxygen therapy. He underwent an angiogram on 6/13 by Dr. 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 He did not have intervention on 6/13. arterial studies performed on 6/19 showed resting ABI to the 0.99 (right) and 1.09 (left). 03/10/18-He is seen in follow up evaluation. Plain film x-ray ordered last week revealed no evidence of acute osseous abnormality and dorsal soft tissue swelling of the midfoot. Culture that was obtained last week from the right great toe was negative. He has an appointment with Dr Lucky Cowboy on 8/23. 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 The right great toe with a small,  superficial, open area with resolution of erythema and improvement in edema; will continue with medihoney. He will follow up next week 03/17/18-He is seen in follow-up evaluation. He was able to reschedule his appointment with vascular medicine and was seen on 8/14. In office ABIs are similar to studies on 6/19, right toe waveform is blunted with known small vessel disease; they are recommending compression therapy which the patient is refusing to wear. 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 Vickki Muff. The patient's wife is going to try and move that appointment up to next week. The patient does exhibit right foot pain consistent with rest pain, relieved with dependent position. There is cyanotic discoloration to right toes 1-3 and along the lateral aspect and heel of the right foot. The wound to the dorsal aspect of the right great toe is larger, dry; we will initiate santyl. He can follow up in 1-2 weeks 04/15/18 on evaluation today patient's right foot ulcer in regard to the fourth and fifth toes really appears to be no better. In fact this is pretty much a complete region of gangrene that this point. He also has microvascular disease and the remaining toes and the discoloration noted today is extremely cyanotic blue/purple. He actually was supposed to have Artie had invitation but unfortunately this is been delayed because there is some question as to whether or not he may actually have a G.I. bleed due to being severely anemic. He's also apparently having some issues with his heart which is also delayed the process at this point. Unfortunately in general nothing seems to be improving if anything his wife tells me that this seems to be getting worse. 05/09/18 on evaluation today patient actually appears to  be doing worse in regard to the gangrene of his right foot. This is actually spread to all toes and actually up a portion of his foot as well. He has been referred by  podiatry to Dr. dew, vascular surgery, in order to evaluate for a below the knee amputation. Nonetheless the patient is having some discomfort but his biggest concern that he seems to be stuck on more than anything is how far up Dr. dew is going to have to take his leg off. This is a great concern for him. In fact he asked me the same question at least 4-5 times during evaluation today ADMISSION 02/01/2019 This is now a 63 year old man with type 2 diabetes and known PAD. 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 According to the history that his wife is giving [place] that the wound held together when the sutures were removed however gradually this opened and expanded. He was taken to the OR on 08/08/2018 for debridement. He had a wound VAC on this for several months which apparently resulted in good improvement with the wound but the patient states Burkland, Maurie B. ([id number]) he could not tolerate it. Currently they are using Xeroform ABDs and gauze. They have been followed extensively vein and vascular by Dr. dew service. The patient had an angiogram on 01/13/2018 which showed a normal CFA, PFA and stents in both iliac arteries were patent. These were placed in March 2019. The patient had an MRI of the stump with contrast in December 2019. This suggested inflammatory changes within the anterior soft tissue of the remaining lower leg status post below-knee amputation there was some suggestion of a sinus tract. Adjacent to the soft tissue change there was marrow T2 hyperdensity and low-level enhancement which could reflect osteomyelitis. At this point I am not sure what was done about this report I will need to look back through the records. Past medical history; type 2 diabetes with known PAD, paroxysmal A. fib on Eliquis, iron deficiency anemia, hyperlipidemia, left occipital CVA, right external iliac stent 3/19. 7/8; the patient apparently tolerated the Dakin's  wet-to-dry twice daily until this morning and then they put Xeroform on it. Area I debrided last week looks quite good. 7/15; using Dakin's wet-to-dry. Wound is coming along from debridement point of view. He still uses tramadol for pain and I have given him a prescription for this today 7/22 he is using Dakin's wet-to-dry. The wound surface continues to be a bit of a challenge and there really is not any epithelialization. He tolerates debridements even vigorous washing of the wound very poorly. I have talked to him about stump revisions which could be either below-knee if it is felt that he has enough blood flow to this area or above-knee. He is really opposed to any thought of above-knee amputation. 7/29; he comes in today saying that the Dakin's wet-to-dry was painful and that he changed it to Xeroform. The wound surface was not viable although fortunately most of this seems to wipe off with saline soaked gauze. 8/5-Patient returns at 1 week, he is complaining of pain and he has been using the Xeroform, the wound appears to be just about the same with no progress, he does have an appointment with vascular this Friday. Is also complaining of pain and requesting pain medications that were prescribed at his last visit 2 visits ago. Electronic Signature(s) Signed: 03/08/2019 3:51:40 PM By: Tobi Bastos Entered By: Tobi Bastos on 03/08/2019  15:51:40 DARROLD, BEZEK ([id number]) -------------------------------------------------------------------------------- Physical Exam Details Patient Name: Wenz, Inri B. Date of Service: 03/08/2019 3:15 PM Medical Record Number: [id number] Patient Account Number: [id number] Date of Birth/Sex: 02-18-1956 (63 y.o. M) Treating RN: Cornell Barman Primary Care Provider: Park Liter Other Clinician: Referring Provider: Park Liter Treating Provider/Extender: Beverly Gust in Treatment: 5 Constitutional alert and oriented x 3. sitting or  standing blood pressure is within target range for patient.. supine blood pressure is within target range for patient.. pulse regular and within target range for patient.Marland Kitchen respirations regular, non-labored and within target range for patient.Marland Kitchen temperature within target range for patient.. . . Well-nourished and well-hydrated in no acute distress. Notes The right BKA stump wound has the same dimensions as before, there is granulation tissue but really no progress in terms of wound contracture. Electronic Signature(s) Signed: 03/08/2019 3:52:21 PM By: Tobi Bastos Entered By: Tobi Bastos on 03/08/2019 15:52:21 Pettibone, Louie Boston ([id number]) -------------------------------------------------------------------------------- Physician Orders Details Patient Name: [name] Date of Service: 03/08/2019 3:15 PM Medical Record Number: [id number] Patient Account Number: [id number] Date of Birth/Sex: 01-03-1956 (63 y.o. M) Treating RN: Cornell Barman Primary Care Provider: Park Liter Other Clinician: Referring Provider: Park Liter Treating Provider/Extender: Beverly Gust in Treatment: 5 Verbal / Phone Orders: No Diagnosis Coding Wound Cleansing Wound #4 Right Amputation Site - Below Knee o Cleanse wound with mild soap and water Anesthetic (add to Medication List) Wound #4 Right Amputation Site - Below Knee o Topical Lidocaine 4% cream applied to wound bed prior to debridement (In Clinic Only). o Benzocaine Topical Anesthetic Spray applied to wound bed prior to debridement (In Clinic Only). Primary Wound Dressing Wound #4 Right Amputation Site - Below Knee o Hydrafera Blue Ready Transfer Secondary Dressing Wound #4 Right Amputation Site - Below Knee o ABD and Kerlix/Conform Dressing Change Frequency Wound #4 Right Amputation Site - Below Knee o Change dressing every other day. Follow-up Appointments Wound #4 Right Amputation Site - Below Knee o Return  Appointment in 1 week. Edema Control Wound #4 Right Amputation Site - Below Knee o Elevate legs to the level of the heart and pump ankles as often as possible Additional Orders / Instructions Wound #4 Right Amputation Site - Below Knee o Activity as tolerated Medications-please add to medication list. o Other: - No additional meds prescribed. Notes Follow up with vascular regarding revision surgery. Electronic Signature(s) Signed: 03/08/2019 3:54:09 PM By: Gretta Cool, BSN, RN, CWS, Kim RN, BSN Carrero, Louie Boston ([id number]) Signed: 03/08/2019 4:03:26 PM By: Tobi Bastos Entered By: Gretta Cool BSN, RN, CWS, Kim on 03/08/2019 15:54:08 Dispenza, Louie Boston ([id number]) -------------------------------------------------------------------------------- Progress Note Details Patient Name: JUSTUS, DROKE B. Date of Service: 03/08/2019 3:15 PM Medical Record Number: [id number] Patient Account Number: [id number] Date of Birth/Sex: 01/22/1956 (63 y.o. M) Treating RN: Cornell Barman Primary Care Provider: Park Liter Other Clinician: Referring Provider: Park Liter Treating Provider/Extender: Beverly Gust in Treatment: 5 Subjective History of Present Illness (HPI) 03/03/18- He is here for initial evaluation of right toe wounds and tissue necrosis. He saw Dr. Vickki Muff on 7/23 where he was noted to have progressive ischemic changes to his toes. He was referred from Dr. Fellows office to the wound clinic for possible hyperbaric oxygen therapy. At this time, based on the information we have at hand, there is no diagnosis that would qualify him for hyperbaric oxygen therapy. He underwent an angiogram on 6/13 by Dr. Lucky Cowboy which showed bilateral iliac artery stents with no significant stenosis, minimal disease to  the right CFA, right profunda, right SFA, right popliteal and three-vessel tibial runoff although both the posterior tibial and anterior tibial arteries are essentially occluded with foot and the  peroneal artery terminates at the ankle; likely a combination of previous embolization from iliac disease as well as intermittent atrial fibrillation creating embolic particles. He did not have intervention on 6/13. arterial studies performed on 6/19 showed resting ABI to the 0.99 (right) and 1.09 (left). 03/10/18-He is seen in follow up evaluation. Plain film x-ray ordered last week revealed no evidence of acute osseous abnormality and dorsal soft tissue swelling of the midfoot. Culture that was obtained last week from the right great toe was negative. He has an appointment with Dr Lucky Cowboy on 8/23. 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 The right great toe with a small, superficial, open area with resolution of erythema and improvement in edema; will continue with medihoney. He will follow up next week 03/17/18-He is seen in follow-up evaluation. He was able to reschedule his appointment with vascular medicine and was seen on 8/14. In office ABIs are similar to studies on 6/19, right toe waveform is blunted with known small vessel disease; they are recommending compression therapy which the patient is refusing to wear. 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 Vickki Muff. The patient's wife is going to try and move that appointment up to next week. The patient does exhibit right foot pain consistent with rest pain, relieved with dependent position. There is cyanotic discoloration to right toes 1-3 and along the lateral aspect and heel of the right foot. The wound to the dorsal aspect of the right great toe is larger, dry; we will initiate santyl. He can follow up in 1-2 weeks 04/15/18 on evaluation today patient's right foot ulcer in regard to the fourth and fifth toes really appears to be no better. In fact this is pretty much a complete region of gangrene that this point. He also has microvascular disease and the remaining toes and the discoloration  noted today is extremely cyanotic blue/purple. He actually was supposed to have Artie had invitation but unfortunately this is been delayed because there is some question as to whether or not he may actually have a G.I. bleed due to being severely anemic. He's also apparently having some issues with his heart which is also delayed the process at this point. Unfortunately in general nothing seems to be improving if anything his wife tells me that this seems to be getting worse. 05/09/18 on evaluation today patient actually appears to be doing worse in regard to the gangrene of his right foot. This is actually spread to all toes and actually up a portion of his foot as well. He has been referred by podiatry to Dr. dew, vascular surgery, in order to evaluate for a below the knee amputation. Nonetheless the patient is having some discomfort but his biggest concern that he seems to be stuck on more than anything is how far up Dr. dew is going to have to take his leg off. This is a great concern for him. In fact he asked me the same question at least 4-5 times during evaluation today ADMISSION 02/01/2019 This is now a 63 year old man with type 2 diabetes and known PAD. 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 According to the history that his wife is giving [place] that the wound held together when the sutures were  removed however gradually this opened and expanded. He was taken to the OR on 08/08/2018 for debridement. He had a wound VAC on this for several months which apparently resulted in good improvement with the wound but the patient states he could not tolerate it. Currently they are using Xeroform ABDs and gauze. They have been followed extensively vein and vascular by Dr. dew service. MAXMILIAN, TROSTEL ([id number]) The patient had an angiogram on 01/13/2018 which showed a normal CFA, PFA and stents in both iliac arteries were patent. These were placed in March 2019. The  patient had an MRI of the stump with contrast in December 2019. This suggested inflammatory changes within the anterior soft tissue of the remaining lower leg status post below-knee amputation there was some suggestion of a sinus tract. Adjacent to the soft tissue change there was marrow T2 hyperdensity and low-level enhancement which could reflect osteomyelitis. At this point I am not sure what was done about this report I will need to look back through the records. Past medical history; type 2 diabetes with known PAD, paroxysmal A. fib on Eliquis, iron deficiency anemia, hyperlipidemia, left occipital CVA, right external iliac stent 3/19. 7/8; the patient apparently tolerated the Dakin's wet-to-dry twice daily until this morning and then they put Xeroform on it. Area I debrided last week looks quite good. 7/15; using Dakin's wet-to-dry. Wound is coming along from debridement point of view. He still uses tramadol for pain and I have given him a prescription for this today 7/22 he is using Dakin's wet-to-dry. The wound surface continues to be a bit of a challenge and there really is not any epithelialization. He tolerates debridements even vigorous washing of the wound very poorly. I have talked to him about stump revisions which could be either below-knee if it is felt that he has enough blood flow to this area or above-knee. He is really opposed to any thought of above-knee amputation. 7/29; he comes in today saying that the Dakin's wet-to-dry was painful and that he changed it to Xeroform. The wound surface was not viable although fortunately most of this seems to wipe off with saline soaked gauze. 8/5-Patient returns at 1 week, he is complaining of pain and he has been using the Xeroform, the wound appears to be just about the same with no progress, he does have an appointment with vascular this Friday. Is also complaining of pain and requesting pain medications that were prescribed at his  last visit 2 visits ago. Objective Constitutional alert and oriented x 3. sitting or standing blood pressure is within target range for patient.. supine blood pressure is within target range for patient.. pulse regular and within target range for patient.Marland Kitchen respirations regular, non-labored and within target range for patient.Marland Kitchen temperature within target range for patient.. Well-nourished and well-hydrated in no acute distress. Vitals Time Taken: 3:09 PM, Height: 70 in, Weight: 175 lbs, BMI: 25.1, Temperature: 98.2 F, Pulse: 73 bpm, Respiratory Rate: 18 breaths/min, Blood Pressure: 109/65 mmHg. General Notes: The right BKA stump wound has the same dimensions as before, there is granulation tissue but really no progress in terms of wound contracture. Integumentary (Hair, Skin) Wound #4 status is Open. Original cause of wound was Surgical Injury. The wound is located on the Right Amputation Site - Below Knee. The wound measures 12.5cm length x 12cm width x 0.1cm depth; 117.81cm^2 area and 11.781cm^3 volume. There is Fat Layer (Subcutaneous Tissue) Exposed exposed. There is no tunneling or undermining noted. There is a large  amount of serous drainage noted. The wound margin is flat and intact. There is medium (34-66%) pink granulation within the wound bed. There is a medium (34-66%) amount of necrotic tissue within the wound bed including Adherent Slough. ELGAR, SCOGGINS B. ([id number]) Plan 1. Continue using Xeroform and pad 2. Patient to keep his appointment with vascular, revision of BKA may be only way out 3. I have informed patient that since his last tramadol prescription had 1 refill and he is exhausted that he would need to follow-up with his primary care physician or establish with the pain clinic since we are unable to continually prescribe these medications at this clinic Electronic Signature(s) Signed: 03/08/2019 3:53:32 PM By: Tobi Bastos Entered By: Tobi Bastos on 03/08/2019  15:53:31 Mckinlay, Louie Boston ([id number]) -------------------------------------------------------------------------------- Rosita Details Patient Name: [name] Date of Service: 03/08/2019 Medical Record Number: [id number] Patient Account Number: [id number] Date of Birth/Sex: 1956-07-13 (63 y.o. M) Treating RN: Cornell Barman Primary Care Provider: Park Liter Other Clinician: Referring Provider: Park Liter Treating Provider/Extender: Beverly Gust in Treatment: 5 Diagnosis Coding ICD-10 Codes Code Description E11.51 Type 2 diabetes mellitus with diabetic peripheral angiopathy without gangrene 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 limited to breakdown of skin E11.622 Type 2 diabetes mellitus with other skin ulcer Z89.511 Acquired absence of right leg below knee Facility Procedures CPT4 Code: [id number] Description: [id number] - WOUND CARE VISIT-LEV 2 EST PT Modifier: Quantity: 1 Physician Procedures CPT4 Code Description: [id number] - WC PHYS LEVEL 3 - EST PT ICD-10 Diagnosis Description E11.51 Type 2 diabetes mellitus with diabetic peripheral angiopathy w Modifier: ithout gangren Quantity: 1 e Electronic Signature(s) Signed: 03/08/2019 4:05:59 PM By: Tobi Bastos Previous Signature: 03/08/2019 3:54:50 PM Version By: Gretta Cool, BSN, RN, CWS, Kim RN, BSN Previous Signature: 03/08/2019 4:03:26 PM Version By: Tobi Bastos Previous Signature: 03/08/2019 3:53:45 PM Version By: Tobi Bastos Entered By: Tobi Bastos on 03/08/2019 16:05:58

## 2019-03-08 NOTE — Progress Notes (Addendum)
[name], Glenn ([id number]) Visit Report for 03/08/2019 Arrival Information Details Patient Name: [name], [name]. Date of Service: 03/08/2019 3:15 PM Medical Record Number: [id number] Patient Account Number: [id number] Date of Birth/Sex: [birth year] (63 y.o. M) Treating RN: Harold Barban Primary Care Frazer [place]: Park Liter Other Clinician: Referring Noal Abshier: Park Liter Treating Ainhoa Rallo/Extender: Beverly Gust in Treatment: 5 Visit Information History Since Last Visit Added or deleted any medications: No Patient Arrived: Wheel Chair Any new allergies or adverse reactions: No Arrival Time: 15:07 Had a fall or experienced change in No Accompanied By: wife activities of daily living that may affect Transfer Assistance: None risk of falls: Patient Identification Verified: Yes Signs or symptoms of abuse/neglect since last visito No Secondary Verification Process Yes Hospitalized since last visit: No Completed: Has Dressing in Place as Prescribed: Yes Patient Has Alerts: Yes Pain Present Now: Yes Patient Alerts: Patient on Blood Thinner DMII Eliquis Electronic Signature(s) Signed: 03/08/2019 3:45:57 PM By: Harold Barban Entered By: Harold Barban on 03/08/2019 15:08:40 Tuft, Louie Boston ([id number]) -------------------------------------------------------------------------------- Clinic Level of Care Assessment Details Patient Name: [name]. Date of Service: 03/08/2019 3:15 PM Medical Record Number: [id number] Patient Account Number: [id number] Date of Birth/Sex: Apr 10, 1956 (63 y.o. M) Treating RN: Cornell Barman Primary Care Ondrea Dow: Park Liter Other Clinician: Referring Sayaka Hoeppner: Park Liter Treating Kareema Keitt/Extender: Beverly Gust in Treatment: 5 Clinic Level of Care Assessment Items TOOL 4 Quantity Score []  - Use when only an EandM is performed on FOLLOW-UP visit 0 ASSESSMENTS - Nursing Assessment / Reassessment []  - Reassessment of  Co-morbidities (includes updates in patient status) 0 X- 1 5 Reassessment of Adherence to Treatment Plan ASSESSMENTS - Wound and Skin Assessment / Reassessment X - Simple Wound Assessment / Reassessment - one wound 1 5 []  - 0 Complex Wound Assessment / Reassessment - multiple wounds []  - 0 Dermatologic / Skin Assessment (not related to wound area) ASSESSMENTS - Focused Assessment []  - Circumferential Edema Measurements - multi extremities 0 []  - 0 Nutritional Assessment / Counseling / Intervention []  - 0 Lower Extremity Assessment (monofilament, tuning fork, pulses) []  - 0 Peripheral Arterial Disease Assessment (using hand held doppler) ASSESSMENTS - Ostomy and/or Continence Assessment and Care []  - Incontinence Assessment and Management 0 []  - 0 Ostomy Care Assessment and Management (repouching, etc.) PROCESS - Coordination of Care X - Simple Patient / Family Education for ongoing care 1 15 []  - 0 Complex (extensive) Patient / Family Education for ongoing care []  - 0 Staff obtains Programmer, systems, Records, Test Results / Process Orders []  - 0 Staff telephones HHA, Nursing Homes / Clarify orders / etc []  - 0 Routine Transfer to another Facility (non-emergent condition) []  - 0 Routine Hospital Admission (non-emergent condition) []  - 0 New Admissions / Biomedical engineer / Ordering NPWT, Apligraf, etc. []  - 0 Emergency Hospital Admission (emergent condition) X- 1 10 Simple Discharge Coordination Dore, Taiquan [name]. ([id number]) []  - 0 Complex (extensive) Discharge Coordination PROCESS - Special Needs []  - Pediatric / Minor Patient Management 0 []  - 0 Isolation Patient Management []  - 0 Hearing / Language / Visual special needs []  - 0 Assessment of Community assistance (transportation, D/C planning, etc.) []  - 0 Additional assistance / Altered mentation []  - 0 Support Surface(s) Assessment (bed, cushion, seat, etc.) INTERVENTIONS - Wound Cleansing / Measurement X -  Simple Wound Cleansing - one wound 1 5 []  - 0 Complex Wound Cleansing - multiple wounds X- 1 5 Wound Imaging (photographs - any number of wounds) []  - 0  Wound Tracing (instead of photographs) X- 1 5 Simple Wound Measurement - one wound []  - 0 Complex Wound Measurement - multiple wounds INTERVENTIONS - Wound Dressings []  - Small Wound Dressing one or multiple wounds 0 []  - 0 Medium Wound Dressing one or multiple wounds X- 1 20 Large Wound Dressing one or multiple wounds []  - 0 Application of Medications - topical []  - 0 Application of Medications - injection INTERVENTIONS - Miscellaneous []  - External ear exam 0 []  - 0 Specimen Collection (cultures, biopsies, blood, body fluids, etc.) []  - 0 Specimen(s) / Culture(s) sent or taken to Lab for analysis []  - 0 Patient Transfer (multiple staff / Civil Service fast streamer / Similar devices) []  - 0 Simple Staple / Suture removal (25 or less) []  - 0 Complex Staple / Suture removal (26 or more) []  - 0 Hypo / Hyperglycemic Management (close monitor of Blood Glucose) []  - 0 Ankle / Brachial Index (ABI) - do not check if billed separately X- 1 5 Vital Signs Clenney, Khoury [name]. ([id number]) Has the patient been seen at the hospital within the last three years: Yes Total Score: 75 Level Of Care: New/Established - Level 2 Electronic Signature(s) Unsigned Entered By: Gretta Cool, BSN, RN, CWS, Kim on 03/08/2019 15:54:40 Signature(s): Date(s): Laughridge, Louie Boston ([id number]) -------------------------------------------------------------------------------- Encounter Discharge Information Details Patient Name: [name], [name]. Date of Service: 03/08/2019 3:15 PM Medical Record Number: [id number] Patient Account Number: [id number] Date of Birth/Sex: 01-14-1956 (63 y.o. M) Treating RN: Army Melia Primary Care Mya Suell: Park Liter Other Clinician: Referring Genette Huertas: Park Liter Treating Carzell Saldivar/Extender: Beverly Gust in Treatment:  5 Encounter Discharge Information Items Discharge Condition: Stable Ambulatory Status: Wheelchair Discharge Destination: Home Transportation: Private Auto Accompanied By: wife Schedule Follow-up Appointment: Yes Clinical Summary of Care: Electronic Signature(s) Signed: 03/08/2019 4:04:06 PM By: Army Melia Entered By: Army Melia on 03/08/2019 15:59:12 Berlinger, Louie Boston ([id number]) -------------------------------------------------------------------------------- Lower Extremity Assessment Details Patient Name: [name]. Date of Service: 03/08/2019 3:15 PM Medical Record Number: [id number] Patient Account Number: [id number] Date of Birth/Sex: 15-Sep-1955 (63 y.o. M) Treating RN: Harold Barban Primary Care Maleeah Crossman: Park Liter Other Clinician: Referring Aliha Diedrich: Park Liter Treating Jaryiah Mehlman/Extender: Beverly Gust in Treatment: 5 Vascular Assessment Pulses: Popliteal Palpable: [Right:Yes] Electronic Signature(s) Signed: 03/08/2019 3:45:57 PM By: Harold Barban Entered By: Harold Barban on 03/08/2019 15:12:27 Deam, Louie Boston ([id number]) -------------------------------------------------------------------------------- Multi Wound Chart Details Patient Name: [name]. Date of Service: 03/08/2019 3:15 PM Medical Record Number: [id number] Patient Account Number: [id number] Date of Birth/Sex: 1956-01-08 (63 y.o. 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 N/A N/A Comorbid History: Arrhythmia, Hypertension, N/A N/A Peripheral Arterial Disease, Type II Diabetes, Dementia Date Acquired: 01/31/2018 N/A N/A Weeks of Treatment: 5 N/A N/A Wound Status: Open N/A N/A Pending Amputation on Yes N/A N/A Presentation: Measurements L x W x D 12.5x12x0.1 N/A N/A (cm) Area (cm) : 117.81 N/A N/A Volume (cm) : 11.781 N/A N/A % Reduction in Area: 20.40% N/A N/A % Reduction in Volume: 20.40% N/A N/A Classification: Grade 2 N/A N/A Exudate Amount: Large N/A N/A Exudate Type: Serous N/A N/A  Exudate Color: amber N/A N/A Wound Margin: Flat and Intact N/A N/A Granulation Amount: Medium (34-66%) N/A N/A Granulation Quality: Pink N/A N/A Necrotic Amount: Medium (34-66%) N/A N/A Exposed Structures: N/A N/A CRUZ, BONG [name]. ([id number]) Fat Layer (Subcutaneous Tissue) Exposed: Yes Fascia: No Tendon: No Muscle: No Joint: No Bone: No Epithelialization: None N/A N/A Treatment Notes Electronic Signature(s) Signed: 03/08/2019 3:52:01 PM By: Gretta Cool, BSN, RN, CWS, Kim RN, BSN Entered By: Gretta Cool, BSN, RN, CWS, Kim on 03/08/2019 15:52:00 Seney, Louie Boston ([id number]) -------------------------------------------------------------------------------- Multi-Disciplinary Care Plan Details Patient Name: [name]. Date of Service: 03/08/2019 3:15 PM Medical Record Number: [id number] Patient Account Number: [id number] Date of Birth/Sex: 1956/05/25 (63 y.o. M) Treating RN: Cornell Barman Primary Care Jaleeya Mcnelly: Park Liter Other Clinician: Referring Jejuan Scala: Park Liter Treating Alexandra Posadas/Extender: Beverly Gust in Treatment: 5 Active Inactive Necrotic Tissue Nursing Diagnoses: Impaired tissue integrity related to necrotic/devitalized tissue Goals: Necrotic/devitalized tissue will be minimized in the wound bed Date Initiated: 02/01/2019 Target Resolution Date: 02/15/2019 Goal Status:  Active Interventions: Assess patient pain level pre-, during and post procedure and prior to discharge Treatment Activities: Apply topical anesthetic as ordered : 02/01/2019 Notes: Orientation to the Wound Care Program Nursing Diagnoses: Knowledge deficit related to the wound healing center program Goals: Patient/caregiver will verbalize understanding of the [place] Date Initiated: 02/01/2019 Target Resolution Date: 02/15/2019 Goal Status: Active Interventions: Provide education on orientation to the wound center Notes: Pain, Acute or Chronic Nursing Diagnoses: Pain Management - Non-cyclic Chronic Pain Pain, acute or chronic: actual or potential Goals: Patient will verbalize adequate pain control and receive pain control interventions during procedures as needed Date Initiated: 02/01/2019 Target Resolution Date: 02/08/2019 DELOSS, AMICO ([id number]) Goal Status: Active Interventions: Complete pain assessment as per visit requirements Treatment Activities: Administer pain control measures as ordered : 02/01/2019 Notes: Wound/Skin Impairment Nursing Diagnoses: Knowledge deficit related to smoking impact on wound healing Goals: Patient/caregiver will verbalize understanding of skin care regimen Date Initiated: 02/01/2019 Target Resolution Date: 02/08/2019 Goal Status: Active Ulcer/skin breakdown will have a volume reduction of 30% by week 4 Date Initiated: 02/01/2019 Target Resolution Date: 03/08/2019 Goal Status: Active Interventions: Assess ulceration(s) every visit Treatment Activities: Referred to DME Ahmari Garton for dressing supplies : 02/01/2019 Skin care regimen initiated : 02/01/2019 Notes: Electronic Signature(s) Signed: 03/08/2019 3:51:51 PM By: Gretta Cool, BSN, RN, CWS, Kim RN, BSN Entered By: Gretta Cool, BSN, RN, CWS, Kim on 03/08/2019 15:51:50 Garms, Louie [place] ([id number]) -------------------------------------------------------------------------------- Pain  Assessment Details Patient Name: [name]. Date of Service: 03/08/2019 3:15 PM Medical Record Number: [id number] Patient Account Number: [id number] Date of Birth/Sex: [birth year] (63 y.o. M) Treating RN: Harold Barban Primary Care Hayla Hinger: Park Liter Other Clinician: Referring Kalisa Girtman: Park Liter Treating Voncille Simm/Extender: Beverly Gust in Treatment: 5 Active Problems Location of Pain Severity and Description of Pain Patient Has Paino Yes Site Locations Rate the pain. Current Pain Level: 6 Pain Management and Medication Current Pain Management: Electronic Signature(s) Signed: 03/08/2019 3:45:57 PM By: Harold Barban Entered By: Harold Barban on 03/08/2019 15:09:22 Stick, Louie [place] ([id number]) -------------------------------------------------------------------------------- Wound Assessment Details Patient Name: [name]. Date of Service: 03/08/2019 3:15 PM Medical Record Number: [id number] Patient Account Number: [id number] Date of Birth/Sex: May 20, 1956 (63 y.o. 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 Wounding Event: Surgical Injury Etiology: Date Acquired: 01/31/2018 Wound Status: Open Weeks Of Treatment: 5 Comorbid Arrhythmia, Hypertension, Peripheral Arterial Clustered Wound: No History: Disease, Type II Diabetes, Dementia Pending Amputation On Presentation Photos Wound Measurements Length: (cm) 12.5 Width: (cm) 12 Depth: (cm) 0.1 Area: (cm) 117.81 Volume: (cm) 11.781 % Reduction in Area: 20.4% % Reduction in Volume: 20.4% Epithelialization: None Tunneling: No Undermining: No Wound  Description Classification: Grade 2 Foul Od Wound Margin: Flat and Intact Slough/ Exudate Amount: Large Exudate Type: Serous Exudate Color: amber or After Cleansing: No Fibrino Yes Wound Bed Granulation Amount: Medium (34-66%) Exposed Structure Granulation Quality: Pink Fascia Exposed: No Necrotic Amount: Medium (34-66%) Fat Layer (Subcutaneous Tissue) Exposed: Yes Necrotic Quality: Adherent Slough Tendon Exposed: No Muscle Exposed: No Joint Exposed: No Bone Exposed: No Treatment Notes Vasbinder, Hendrik [name]. ([id number]) Wound #4 (Right Amputation Site - Below Knee) Notes hydrofera blue, abd, kerlix secured with tape Electronic Signature(s) Signed: 03/08/2019 3:45:57 PM By: Harold Barban Entered By: Harold Barban on 03/08/2019 15:17:52 Vardaman, Louie [place] ([id number]) -------------------------------------------------------------------------------- Vitals Details Patient Name: [name]. Date of Service: 03/08/2019 3:15 PM Medical Record Number: [id number] Patient Account Number: [id number] Date of Birth/Sex: 12-May-1956 (63 y.o. M) Treating RN: Harold Barban Primary Care Shell Blanchette: Park Liter Other Clinician: Referring Jeromy Borcherding: Park Liter Treating Derald Lorge/Extender: Beverly Gust in Treatment: 5 Vital Signs Time Taken: 15:09 Temperature (F): 98.2 Height (in): 70 Pulse (bpm): 73 Weight (lbs): 175 Respiratory Rate (breaths/min): 18 Body Mass Index (BMI): 25.1 Blood Pressure (mmHg): 109/65 Reference Range: 80 - 120 mg / dl Electronic Signature(s) Signed: 03/08/2019 3:45:57 PM By: Harold Barban Entered By: Harold Barban on 03/08/2019 15:10:03

## 2019-03-10 ENCOUNTER — Other Ambulatory Visit: Payer: Self-pay

## 2019-03-10 ENCOUNTER — Ambulatory Visit (INDEPENDENT_AMBULATORY_CARE_PROVIDER_SITE_OTHER): Payer: Medicare HMO | Admitting: Nurse Practitioner

## 2019-03-10 VITALS — BP 108/70 | HR 80 | Resp 16

## 2019-03-10 DIAGNOSIS — E1151 Type 2 diabetes mellitus with diabetic peripheral angiopathy without gangrene: Secondary | ICD-10-CM

## 2019-03-10 DIAGNOSIS — I70209 Unspecified atherosclerosis of native arteries of extremities, unspecified extremity: Secondary | ICD-10-CM | POA: Diagnosis not present

## 2019-03-10 DIAGNOSIS — E782 Mixed hyperlipidemia: Secondary | ICD-10-CM | POA: Diagnosis not present

## 2019-03-10 DIAGNOSIS — I96 Gangrene, not elsewhere classified: Secondary | ICD-10-CM | POA: Diagnosis not present

## 2019-03-10 DIAGNOSIS — Z89511 Acquired absence of right leg below knee: Secondary | ICD-10-CM

## 2019-03-10 DIAGNOSIS — D500 Iron deficiency anemia secondary to blood loss (chronic): Secondary | ICD-10-CM

## 2019-03-12 ENCOUNTER — Other Ambulatory Visit: Payer: Self-pay | Admitting: Family Medicine

## 2019-03-13 ENCOUNTER — Other Ambulatory Visit: Payer: Self-pay

## 2019-03-13 ENCOUNTER — Inpatient Hospital Stay: Payer: Medicare HMO | Attending: Oncology

## 2019-03-13 DIAGNOSIS — Z87891 Personal history of nicotine dependence: Secondary | ICD-10-CM | POA: Diagnosis not present

## 2019-03-13 DIAGNOSIS — Z7982 Long term (current) use of aspirin: Secondary | ICD-10-CM | POA: Diagnosis not present

## 2019-03-13 DIAGNOSIS — L989 Disorder of the skin and subcutaneous tissue, unspecified: Secondary | ICD-10-CM | POA: Insufficient documentation

## 2019-03-13 DIAGNOSIS — R918 Other nonspecific abnormal finding of lung field: Secondary | ICD-10-CM | POA: Insufficient documentation

## 2019-03-13 DIAGNOSIS — I480 Paroxysmal atrial fibrillation: Secondary | ICD-10-CM | POA: Diagnosis not present

## 2019-03-13 DIAGNOSIS — E11621 Type 2 diabetes mellitus with foot ulcer: Secondary | ICD-10-CM | POA: Insufficient documentation

## 2019-03-13 DIAGNOSIS — I10 Essential (primary) hypertension: Secondary | ICD-10-CM | POA: Diagnosis not present

## 2019-03-13 DIAGNOSIS — K5520 Angiodysplasia of colon without hemorrhage: Secondary | ICD-10-CM | POA: Insufficient documentation

## 2019-03-13 DIAGNOSIS — E1151 Type 2 diabetes mellitus with diabetic peripheral angiopathy without gangrene: Secondary | ICD-10-CM | POA: Insufficient documentation

## 2019-03-13 DIAGNOSIS — R69 Illness, unspecified: Secondary | ICD-10-CM | POA: Diagnosis not present

## 2019-03-13 DIAGNOSIS — Z8673 Personal history of transient ischemic attack (TIA), and cerebral infarction without residual deficits: Secondary | ICD-10-CM | POA: Insufficient documentation

## 2019-03-13 DIAGNOSIS — Z7901 Long term (current) use of anticoagulants: Secondary | ICD-10-CM | POA: Diagnosis not present

## 2019-03-13 DIAGNOSIS — Z7984 Long term (current) use of oral hypoglycemic drugs: Secondary | ICD-10-CM | POA: Insufficient documentation

## 2019-03-13 DIAGNOSIS — R0602 Shortness of breath: Secondary | ICD-10-CM | POA: Insufficient documentation

## 2019-03-13 DIAGNOSIS — E785 Hyperlipidemia, unspecified: Secondary | ICD-10-CM | POA: Diagnosis not present

## 2019-03-13 DIAGNOSIS — K219 Gastro-esophageal reflux disease without esophagitis: Secondary | ICD-10-CM | POA: Diagnosis not present

## 2019-03-13 DIAGNOSIS — D500 Iron deficiency anemia secondary to blood loss (chronic): Secondary | ICD-10-CM | POA: Diagnosis present

## 2019-03-13 LAB — FERRITIN: Ferritin: 24 ng/mL (ref 24–336)

## 2019-03-13 LAB — CBC WITH DIFFERENTIAL/PLATELET
Abs Immature Granulocytes: 0.02 10*3/uL (ref 0.00–0.07)
Basophils Absolute: 0 10*3/uL (ref 0.0–0.1)
Basophils Relative: 1 %
Eosinophils Absolute: 0.4 10*3/uL (ref 0.0–0.5)
Eosinophils Relative: 5 %
HCT: 38.7 % — ABNORMAL LOW (ref 39.0–52.0)
Hemoglobin: 12.5 g/dL — ABNORMAL LOW (ref 13.0–17.0)
Immature Granulocytes: 0 %
Lymphocytes Relative: 17 %
Lymphs Abs: 1.4 10*3/uL (ref 0.7–4.0)
MCH: 27 pg (ref 26.0–34.0)
MCHC: 32.3 g/dL (ref 30.0–36.0)
MCV: 83.6 fL (ref 80.0–100.0)
Monocytes Absolute: 0.5 10*3/uL (ref 0.1–1.0)
Monocytes Relative: 7 %
Neutro Abs: 5.6 10*3/uL (ref 1.7–7.7)
Neutrophils Relative %: 70 %
Platelets: 224 10*3/uL (ref 150–400)
RBC: 4.63 MIL/uL (ref 4.22–5.81)
RDW: 14.3 % (ref 11.5–15.5)
WBC: 7.9 10*3/uL (ref 4.0–10.5)
nRBC: 0 % (ref 0.0–0.2)

## 2019-03-13 LAB — IRON AND TIBC
Iron: 66 ug/dL (ref 45–182)
Saturation Ratios: 16 % — ABNORMAL LOW (ref 17.9–39.5)
TIBC: 405 ug/dL (ref 250–450)
UIBC: 339 ug/dL

## 2019-03-14 ENCOUNTER — Other Ambulatory Visit: Payer: Self-pay

## 2019-03-14 ENCOUNTER — Inpatient Hospital Stay (HOSPITAL_BASED_OUTPATIENT_CLINIC_OR_DEPARTMENT_OTHER): Payer: Medicare HMO | Admitting: Oncology

## 2019-03-14 ENCOUNTER — Encounter: Payer: Self-pay | Admitting: Oncology

## 2019-03-14 ENCOUNTER — Inpatient Hospital Stay: Payer: Medicare HMO

## 2019-03-14 VITALS — BP 120/76 | HR 78 | Temp 98.1°F | Resp 18

## 2019-03-14 VITALS — BP 119/75 | HR 77 | Resp 18 | Wt 193.0 lb

## 2019-03-14 DIAGNOSIS — D509 Iron deficiency anemia, unspecified: Secondary | ICD-10-CM

## 2019-03-14 DIAGNOSIS — J340 Abscess, furuncle and carbuncle of nose: Secondary | ICD-10-CM

## 2019-03-14 DIAGNOSIS — D500 Iron deficiency anemia secondary to blood loss (chronic): Secondary | ICD-10-CM | POA: Diagnosis not present

## 2019-03-14 DIAGNOSIS — E785 Hyperlipidemia, unspecified: Secondary | ICD-10-CM | POA: Diagnosis not present

## 2019-03-14 DIAGNOSIS — K5520 Angiodysplasia of colon without hemorrhage: Secondary | ICD-10-CM

## 2019-03-14 DIAGNOSIS — L989 Disorder of the skin and subcutaneous tissue, unspecified: Secondary | ICD-10-CM

## 2019-03-14 DIAGNOSIS — E11621 Type 2 diabetes mellitus with foot ulcer: Secondary | ICD-10-CM | POA: Diagnosis not present

## 2019-03-14 DIAGNOSIS — I480 Paroxysmal atrial fibrillation: Secondary | ICD-10-CM | POA: Diagnosis not present

## 2019-03-14 DIAGNOSIS — K219 Gastro-esophageal reflux disease without esophagitis: Secondary | ICD-10-CM | POA: Diagnosis not present

## 2019-03-14 DIAGNOSIS — I10 Essential (primary) hypertension: Secondary | ICD-10-CM | POA: Diagnosis not present

## 2019-03-14 DIAGNOSIS — R0602 Shortness of breath: Secondary | ICD-10-CM | POA: Diagnosis not present

## 2019-03-14 DIAGNOSIS — E1151 Type 2 diabetes mellitus with diabetic peripheral angiopathy without gangrene: Secondary | ICD-10-CM | POA: Diagnosis not present

## 2019-03-14 MED ORDER — SODIUM CHLORIDE 0.9 % IV SOLN
510.0000 mg | Freq: Once | INTRAVENOUS | Status: AC
  Administered 2019-03-14: 510 mg via INTRAVENOUS
  Filled 2019-03-14: qty 17

## 2019-03-14 MED ORDER — SODIUM CHLORIDE 0.9 % IV SOLN
Freq: Once | INTRAVENOUS | Status: AC
  Administered 2019-03-14: 15:00:00 via INTRAVENOUS
  Filled 2019-03-14: qty 250

## 2019-03-14 MED ORDER — FERROUS SULFATE 325 (65 FE) MG PO TABS: 325.0000 mg | ORAL_TABLET | Freq: Two times a day (BID) | ORAL | 3 refills | Status: AC

## 2019-03-14 MED ORDER — AMOXICILLIN 500 MG PO CAPS: 500.0000 mg | ORAL_CAPSULE | Freq: Three times a day (TID) | ORAL | 0 refills | Status: DC

## 2019-03-14 NOTE — Progress Notes (Signed)
Hematology/Oncology follow u p note [facility] [phone] [phone]   Patient Care Team: [name], DO as PCP - General (Family Medicine) Minna Merritts, MD as PCP - Cardiology (Cardiology) [name], DO as Referring Physician (Family Medicine) Yolonda Kida, MD as Consulting Physician (Cardiology) Vladimir Crofts, MD (Neurology) Lucky Cowboy Erskine Squibb, MD as Referring Physician (Vascular Surgery)  REFERRING PROVIDER: [name], DO REASON FOR VISIT:  Follow-up for management of iron deficiency anemia.  HISTORY OF PRESENTING ILLNESS:  Glenn Grupe. is a  63 y.o.  male with PMH listed below who was referred to me for evaluation of anemia Reviewed patient's recent labs that was done at [facility] office. Labs revealed anemia with hemoglobin of 11 .   Reviewed patient's previous labs ordered by primary care physician's office, anemia onset was since July 2019 04/07/2018, iron panel showed ferritin 13, iron saturation 28, TIBC 500. He takes oral iron supplementation. 05/06/2018, iron panel showed ferritin 19, iron saturation 4.  TIBC 389. Patient was scheduled for colonoscopy on 05/12/2018 for evaluation of GI blood loss. Patient is on chronic anticoagulation Eliquis for atrial fibrillation and recurrent stroke.  Also on Plavix.  For colonoscopy, he was advised to hold oral iron supplements 5 days prior to the colonoscopy.  Patient has severe peripheral vascular disease with complications including lower extremity ulceration and necrotic toes.  Amputation was held due to acute GI bleed.  Patient reports severe pain for which he takes Neurontin 400 mg 3 times a day and the Percocet 10-325 mg 1 tablet every 6 hours as needed for pain.  Patient reports pain is at a level 5 out of 10.  Appears uncomfortable, alternating between sitting and standing during assessment due to lower extremity discomfort.  He reports that pain medication has  relief his pain sometimes.  Associated signs and symptoms: Patient reports fatigue.  Shortness of breath with exertion.  #GI work-up as listed below. Colonoscopy 05/12/2018 by Dr. Vicente Males showed diverticulosis in the sigmoid colon.  Multiple polyps were resected and retrieved 05/12/2018 upper endoscopy showed a normal examined duodenum.  Normal esophagus, gastritis.. 07/19/2018, capsule study showed single nonbleeding AVM seen in the proximal small bowel  INTERVAL HISTORY Glenn B Stanislawski [name] is a 63 y.o. male who has above history reviewed by me today presents for follow up visit for management of iron deficiency anemia.  Problems and complaints are listed below: Patient had virtual visit with me On 12/13/2018. 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 Lucky Cowboy  And wound care. Seen by gastroenterology Dr. Vicente Males 01/23/2019 and discussed about enteroscopy and a cautery of the AVM.  Patient prefers to watch and see if hemoglobin drops. Denies hematochezia, hematuria, hematemesis, epistaxis, black tarry stool or easy bruising.  . Chronic fatigue slightly worse. #He also had a lesion on the right side of the nose which he noticed for the past week, some soreness after he picks it.  Review of Systems  Constitutional: Positive for malaise/fatigue. Negative for chills, fever and weight loss.  HENT: Negative for nosebleeds and sore throat.   Eyes: Negative for double vision, photophobia and redness.  Respiratory: Negative for cough, shortness of breath and wheezing.   Cardiovascular: Negative for chest pain, palpitations and orthopnea.  Gastrointestinal: Negative for abdominal pain, blood in stool, nausea and vomiting.  Genitourinary: Negative for dysuria.  Musculoskeletal: Negative for back pain, myalgias and neck pain.  Skin: Negative  for itching and rash.  Neurological: Negative for dizziness, tingling and tremors.  Endo/Heme/Allergies: Negative for  environmental allergies. Does not bruise/bleed easily.  Psychiatric/Behavioral: Negative for depression.    MEDICAL HISTORY:  Past Medical History:  Diagnosis Date  . Acute left PCA stroke ([place]) 02/17/2015  . Anemia   . Atherosclerosis of native arteries of the extremities with gangrene ([place]) 05/08/2018  . Atrial fibrillation ([place])   . Blind    right eye  . Diabetes mellitus with complication ([place])   . Dilated aortic root ([place])    a. 04/2018 Echo: 4.1cm. Asc Ao 3.5cm.  Marland Kitchen Dysrhythmia    a fib  . Gangrene of right foot ([place]) 06/02/2018  . GERD (gastroesophageal reflux disease)   . History of echocardiogram    a. 04/2018 Echo: Ef 60-65%, no rwma, midly to mod dil Ao root - 4.1cm. Asc Ao 3.5cm. Mild MR. Nl RV fxn. Nl PASP.  Marland Kitchen History of hernia repair   . History of stress test    a. 2016 MV ([name]): EF 58%, no ischemia.  . Hyperlipidemia   . Hypertension   . Leg pain   . Legally blind   . Necrotic toes ([place]) 02/21/2018  . PAD (peripheral artery disease) ([place])   . PAF (paroxysmal atrial fibrillation) ([name])    a. on Eliquis as of 2018; b. CHADS2VASc => 5 (HTN, DM, stroke x 2, vascular disease)  . Peripheral vascular disease ([place])    a. followed by Dr. Lucky Cowboy; b. s/p kissing balloon stents and right external iliac stent in 10/2017; c. LE angiogrpahy 01/2018: No significant arterial occlusive disease in the lower extremities.  . Pulmonary nodules   . Stroke [facility])    a. 2016 & 2018  . Stroke ([place]) 07/11/2018    SURGICAL HISTORY: Past Surgical History:  Procedure Laterality Date  . AMPUTATION Right 06/02/2018   Procedure: AMPUTATION BELOW KNEE;  Surgeon: [name], MD;  Location: [facility] ORS;  Service: Vascular;  Laterality: Right;  . APPENDECTOMY    . COLONOSCOPY WITH PROPOFOL N/A 05/12/2018   Procedure: COLONOSCOPY WITH PROPOFOL;  Surgeon: [name], MD;  Location: [facility] ENDOSCOPY;  Service: Gastroenterology;  Laterality: N/A;  . ESOPHAGOGASTRODUODENOSCOPY (EGD) WITH PROPOFOL N/A  05/12/2018   Procedure: ESOPHAGOGASTRODUODENOSCOPY (EGD) WITH PROPOFOL;  Surgeon: [name], MD;  Location: [facility] ENDOSCOPY;  Service: Gastroenterology;  Laterality: N/A;  . GIVENS CAPSULE STUDY N/A 07/13/2018   Procedure: GIVENS CAPSULE STUDY;  Surgeon: [name], MD;  Location: [facility] ENDOSCOPY;  Service: Gastroenterology;  Laterality: N/A;  . HERNIA REPAIR     UMBILICAL  . LOWER EXTREMITY ANGIOGRAPHY Right 10/25/2017   Procedure: LOWER EXTREMITY ANGIOGRAPHY;  Surgeon: [name], MD;  Location: [place] CV LAB;  Service: Cardiovascular;  Laterality: Right;  . LOWER EXTREMITY ANGIOGRAPHY Right 01/13/2018   Procedure: LOWER EXTREMITY ANGIOGRAPHY;  Surgeon: [name], MD;  Location: [place] CV LAB;  Service: Cardiovascular;  Laterality: Right;  . LOWER EXTREMITY INTERVENTION  10/25/2017   Procedure: LOWER EXTREMITY INTERVENTION;  Surgeon: [name], MD;  Location: [place] CV LAB;  Service: Cardiovascular;;  . SKIN SPLIT GRAFT Right 12/28/2018   Procedure: SKIN GRAFT SPLIT THICKNESS ( SYNTHETIC );  Surgeon: [name], MD;  Location: [facility] ORS;  Service: Vascular;  Laterality: Right;  . TONSILLECTOMY    . WOUND DEBRIDEMENT Right 08/08/2018   Procedure: DEBRIDEMENT WOUND WITH WOUND VAC APPLICATION;  Surgeon: [name], MD;  Location: [facility] ORS;  Service: Vascular;  Laterality:  Right;    SOCIAL HISTORY: Social History   Socioeconomic History  . Marital status: Married    Spouse name: Not on file  . Number of children: 0  . Years of education: Not on file  . Highest education level: Associate degree: academic program  Occupational History  . Not on file  Social Needs  . Financial resource strain: Somewhat hard  . Food insecurity    Worry: Never true    Inability: Never true  . Transportation needs    Medical: No    Non-medical: No  Tobacco Use  . Smoking status: Former Smoker    Packs/day: 1.00    Years: 30.00    Pack years: 30.00    Types: Cigarettes     Quit date: 01/09/2018    Years since quitting: 1.1  . Smokeless tobacco: Never Used  Substance and Sexual Activity  . Alcohol use: Never    Alcohol/week: 0.0 standard drinks    Frequency: Never    Comment: occasionally beer   . Drug use: No  . Sexual activity: Yes  Lifestyle  . Physical activity    Days per week: 0 days    Minutes per session: 0 min  . Stress: Not at all  Relationships  . Social Herbalist on phone: More than three times a week    Gets together: Never    Attends religious service: Never    Active member of club or organization: No    Attends meetings of clubs or organizations: Never    Relationship status: Married  . Intimate partner violence    Fear of current or ex partner: No    Emotionally abused: No    Physically abused: No    Forced sexual activity: No  Other Topics Concern  . Not on file  Social History Narrative  . Not on file    FAMILY HISTORY: Family History  Problem Relation Age of Onset  . Diabetes Father   . Hypertension Father   . Hyperlipidemia Father     ALLERGIES:  is allergic to sodium pentobarbital [pentobarbital] and lipitor [atorvastatin].  MEDICATIONS:  Current Outpatient Medications  Medication Sig Dispense Refill  . acetaminophen (TYLENOL) 500 MG tablet Take 1,000 mg by mouth every 6 (six) hours as needed (for pain.).    Marland Kitchen apixaban (ELIQUIS) 5 MG TABS tablet Take 1 tablet (5 mg total) by mouth 2 (two) times daily. 180 tablet 1  . aspirin 81 MG EC tablet Take 1 tablet (81 mg total) by mouth daily. 90 tablet 4  . Biotin (BIOTIN 5000) 5 MG CAPS Take 1 capsule by mouth daily.    . Calcium Carb-Cholecalciferol (CALCIUM 600-D PO) Take by mouth daily.    . cholecalciferol (VITAMIN D3) 25 MCG (1000 UT) tablet Take 5,000 Units by mouth daily.     Marland Kitchen docusate sodium (COLACE) 100 MG capsule Take 1 capsule (100 mg total) by mouth daily. Do not take if you have loose stools 90 capsule 2  . donepezil (ARICEPT) 5 MG tablet Take 5  mg by mouth at bedtime.    . empagliflozin (JARDIANCE) 10 MG TABS tablet Take 5 mg by mouth daily. 90 tablet 1  . ezetimibe-simvastatin (VYTORIN) 10-40 MG tablet Take 1 tablet by mouth daily at 6 PM. 90 tablet 2  . ferrous sulfate (FERROUSUL) 325 (65 FE) MG tablet Take 1 tablet (325 mg total) by mouth 2 (two) times daily with a meal. 120 tablet 3  . gabapentin (NEURONTIN)  800 MG tablet Take 1 tablet (800 mg total) by mouth 3 (three) times daily. 270 tablet 1  . hydrochlorothiazide (MICROZIDE) 12.5 MG capsule Take 1 capsule (12.5 mg total) by mouth daily. 45 capsule 1  . lansoprazole (PREVACID) 30 MG capsule Take 30 mg by mouth daily at 12 noon.    . metFORMIN (GLUCOPHAGE) 1000 MG tablet Take 1 tablet (1,000 mg total) by mouth 2 (two) times daily with a meal. 180 tablet 3  . metoprolol tartrate (LOPRESSOR) 25 MG tablet Take 1 tablet (25 mg total) by mouth 2 (two) times daily. 180 tablet 3  . Multiple Vitamin (MULTIVITAMIN WITH MINERALS) TABS tablet Take 1 tablet by mouth daily. 30 tablet 0  . ONE TOUCH ULTRA TEST test strip USE TO TEST BLOOD SUGAR BID 100 each 12  . ONETOUCH DELICA LANCETS 16X MISC USE AS DIRECTED TO TEST BLOOD SUGAR BID 100 each 12  . traMADol (ULTRAM) 50 MG tablet Take 1 tablet (50 mg total) by mouth every 12 (twelve) hours as needed. 30 tablet 0  . vitamin B-12 (CYANOCOBALAMIN) 1000 MCG tablet Take 1,000 mcg by mouth daily.    . vitamin C (ASCORBIC ACID) 500 MG tablet Take 500 mg by mouth daily.    Marland Kitchen amoxicillin (AMOXIL) 500 MG capsule Take 1 capsule (500 mg total) by mouth 3 (three) times daily. 15 capsule 0  . HYDROcodone-acetaminophen (NORCO) 5-325 MG tablet Take 1 tablet by mouth every 6 (six) hours as needed for moderate pain. (Patient not taking: Reported on 03/14/2019) 30 tablet 0   No current facility-administered medications for this visit.      PHYSICAL EXAMINATION: ECOG PERFORMANCE STATUS: 2 - Symptomatic, <50% confined to bed Vitals:   03/14/19 1332  BP: 119/75   Pulse: 77  Resp: 18   Filed Weights   03/14/19 1335  Weight: 193 lb (87.5 kg)    Physical Exam Constitutional:      General: He is not in acute distress.    Comments: Wheelchair-bound  HENT:     Head: Normocephalic and atraumatic.     Nose:     Comments: Right side of nose 2 to 3 cm area of induration, erythema.  Eyes:     General: No scleral icterus.    Pupils: Pupils are equal, round, and reactive to light.  Neck:     Musculoskeletal: Normal range of motion and neck supple.  Cardiovascular:     Rate and Rhythm: Normal rate and regular rhythm.     Heart sounds: Normal heart sounds.  Pulmonary:     Effort: Pulmonary effort is normal. No respiratory distress.     Breath sounds: No wheezing.  Abdominal:     General: Bowel sounds are normal. There is no distension.     Palpations: Abdomen is soft. There is no mass.     Tenderness: There is no abdominal tenderness.  Musculoskeletal: Normal range of motion.        General: No tenderness or deformity.     Comments: Right lower extremity s/p below knee amputation.   stump status post synthetic skin graft, covered by dressing  Skin:    General: Skin is warm and dry.     Findings: No erythema or rash.  Neurological:     Mental Status: He is alert and oriented to person, place, and time.     Cranial Nerves: No cranial nerve deficit.     Coordination: Coordination normal.  Psychiatric:        Behavior: Behavior  normal.        Thought Content: Thought content normal.      LABORATORY DATA:  I have reviewed the data as listed Lab Results  Component Value Date   WBC 7.9 03/13/2019   HGB 12.5 (L) 03/13/2019   HCT 38.7 (L) 03/13/2019   MCV 83.6 03/13/2019   PLT 224 03/13/2019   Recent Labs    05/06/18 1624  09/26/18 1607 12/23/18 1236 12/27/18 1515  NA 139   < > 144 141 141  K 4.4   < > 4.5 4.2 4.3  CL 97   < > 99 101 100  CO2 22   < > 26 29 23   GLUCOSE 113*   < > 126* 131* 141*  BUN 11   < > 16 16 15    CREATININE 0.67*   < > 0.77 0.52* 0.69*  CALCIUM 9.7   < > 10.8* 10.0 10.0  GFRNONAA 102   < > 97 >60 100  GFRAA 118   < > 112 >60 116  PROT 7.1  --  7.2  --  7.3  ALBUMIN 4.2  --  4.8  --  4.7  AST 15  --  26  --  23  ALT 13  --  31  --  23  ALKPHOS 82  --  88  --  91  BILITOT 0.2  --  0.7  --  0.5   < > = values in this interval not displayed.   Iron/TIBC/Ferritin/ %Sat    Component Value Date/Time   IRON 66 03/13/2019 1312   IRON 17 (L) 05/06/2018 1624   TIBC 405 03/13/2019 1312   TIBC 389 05/06/2018 1624   FERRITIN 24 03/13/2019 1312   FERRITIN 19 (L) 05/06/2018 1624   IRONPCTSAT 16 (L) 03/13/2019 1312   IRONPCTSAT 4 (LL) 05/06/2018 1624        ASSESSMENT & PLAN:  1. Iron deficiency anemia due to chronic blood loss   2. AVM (arteriovenous malformation) of small bowel, acquired   3. Lesion of skin of nose   4. Cellulitis of sidewall of nose    #Iron deficiency anemia Labs are reviewed and discussed with the patient Hemoglobin slightly decreased to 12.5. Iron deficiency showed low iron saturation at 16, ferritin level at 24.  TIBC 405. Consistent with mild iron deficiency anemia. Recommend to proceed with Feraheme 510 mg x 1. Recommend patient to take ferrous sulfate 325 mg twice daily.  Refill sent to pharmacy.   AVM, he likely has ongoing blood loss.  Recommend continue follow-up with gastroenterology.  He was offered for procedure to ablate AVM.  Patient prefers to watch hemoglobin for now.  Right nose lesion, likely cellulitis given the acute onset of symptoms. Recommend patient to start a course of amoxicillin 500 mg 3 times daily for 5 days. Advised patient to follow-up with primary care physician in 2 weeks for reevaluation. He may need to see dermatology if lesion fails to improve with antibiotics. He voices understanding and agrees with the plan.  Marland Kitchen RTC 3 months with repeat labs iron TIBC ferritin to be done prior to the visit +/-IV Feraheme. Orders  Placed This Encounter  Procedures  . CBC with Differential/Platelet    Standing Status:   Future    Standing Expiration Date:   03/13/2020  . Ferritin    Standing Status:   Future    Standing Expiration Date:   03/13/2020  . Iron and TIBC    Standing Status:  Future    Standing Expiration Date:   03/13/2020    All questions were answered. The patient knows to call the clinic with any problems questions or concerns.  Return of visit: 3 months  Earlie Server, MD, PhD Hematology Oncology [facility] at [facility] Pager- [phone] 03/14/2019

## 2019-03-14 NOTE — Progress Notes (Signed)
Patient denies any concerns today.  

## 2019-03-15 ENCOUNTER — Encounter: Payer: Medicare HMO | Admitting: Internal Medicine

## 2019-03-15 DIAGNOSIS — Z89511 Acquired absence of right leg below knee: Secondary | ICD-10-CM | POA: Diagnosis not present

## 2019-03-15 DIAGNOSIS — L97811 Non-pressure chronic ulcer of other part of right lower leg limited to breakdown of skin: Secondary | ICD-10-CM | POA: Diagnosis not present

## 2019-03-15 DIAGNOSIS — E1151 Type 2 diabetes mellitus with diabetic peripheral angiopathy without gangrene: Secondary | ICD-10-CM | POA: Diagnosis not present

## 2019-03-15 DIAGNOSIS — E11622 Type 2 diabetes mellitus with other skin ulcer: Secondary | ICD-10-CM | POA: Diagnosis not present

## 2019-03-15 DIAGNOSIS — T8789 Other complications of amputation stump: Secondary | ICD-10-CM | POA: Diagnosis not present

## 2019-03-16 ENCOUNTER — Telehealth (INDEPENDENT_AMBULATORY_CARE_PROVIDER_SITE_OTHER): Payer: Self-pay

## 2019-03-16 NOTE — Progress Notes (Addendum)
GRAYLIN, SPERLING ([id number]) Visit Report for 03/15/2019 Arrival Information Details Patient Name: [name], [name]. Date of Service: 03/15/2019 3:45 PM Medical Record Number: [id number] Patient Account Number: [id number] Date of Birth/Sex: [birth year] (63 y.o. M) Treating RN: Army Melia Primary Care Indigo Barbian: Park Liter Other Clinician: Referring Michaella Imai: Park Liter Treating Preston Weill/Extender: Beverly Gust in Treatment: 6 Visit Information History Since Last Visit Added or deleted any medications: No Patient Arrived: Wheel Chair Any new allergies or adverse reactions: No Arrival Time: 15:47 Had a fall or experienced change in No Accompanied By: wife activities of daily living that may affect Transfer Assistance: None risk of falls: Patient Has Alerts: Yes Signs or symptoms of abuse/neglect since last visito No Patient Alerts: Patient on Blood Thinner Hospitalized since last visit: No DMII Has Dressing in Place as Prescribed: Yes Eliquis Pain Present Now: Yes Electronic Signature(s) Signed: 03/15/2019 4:29:12 PM By: Army Melia Entered By: Army Melia on 03/15/2019 15:47:54 [name] ([id number]) -------------------------------------------------------------------------------- Clinic Level of Care Assessment Details Patient Name: Glenn Standard B. Date of Service: 03/15/2019 3:45 PM Medical Record Number: [id number] Patient Account Number: [id number] Date of Birth/Sex: [birth year] (63 y.o. M) Treating RN: Cornell Barman Primary Care Alaney Witter: Park Liter Other Clinician: Referring Demarius Archila: Park Liter Treating Hersh Minney/Extender: Beverly Gust in Treatment: 6 Clinic Level of Care Assessment Items TOOL 4 Quantity Score []  - Use when only an EandM is performed on FOLLOW-UP visit 0 ASSESSMENTS - Nursing Assessment / Reassessment []  - Reassessment of Co-morbidities (includes updates in patient status) 0 X- 1 5 Reassessment of Adherence to  Treatment Plan ASSESSMENTS - Wound and Skin Assessment / Reassessment X - Simple Wound Assessment / Reassessment - one wound 1 5 []  - 0 Complex Wound Assessment / Reassessment - multiple wounds []  - 0 Dermatologic / Skin Assessment (not related to wound area) ASSESSMENTS - Focused Assessment []  - Circumferential Edema Measurements - multi extremities 0 []  - 0 Nutritional Assessment / Counseling / Intervention []  - 0 Lower Extremity Assessment (monofilament, tuning fork, pulses) []  - 0 Peripheral Arterial Disease Assessment (using hand held doppler) ASSESSMENTS - Ostomy and/or Continence Assessment and Care []  - Incontinence Assessment and Management 0 []  - 0 Ostomy Care Assessment and Management (repouching, etc.) PROCESS - Coordination of Care X - Simple Patient / Family Education for ongoing care 1 15 []  - 0 Complex (extensive) Patient / Family Education for ongoing care []  - 0 Staff obtains Programmer, systems, Records, Test Results / Process Orders []  - 0 Staff telephones HHA, Nursing Homes / Clarify orders / etc []  - 0 Routine Transfer to another Facility (non-emergent condition) []  - 0 Routine Hospital Admission (non-emergent condition) []  - 0 New Admissions / Biomedical engineer / Ordering NPWT, Apligraf, etc. []  - 0 Emergency Hospital Admission (emergent condition) []  - 0 Simple Discharge Coordination [name], [name] ([id number]) []  - 0 Complex (extensive) Discharge Coordination PROCESS - Special Needs []  - Pediatric / Minor Patient Management 0 []  - 0 Isolation Patient Management []  - 0 Hearing / Language / Visual special needs []  - 0 Assessment of Community assistance (transportation, D/C planning, etc.) []  - 0 Additional assistance / Altered mentation []  - 0 Support Surface(s) Assessment (bed, cushion, seat, etc.) INTERVENTIONS - Wound Cleansing / Measurement X - Simple Wound Cleansing - one wound 1 5 []  - 0 Complex Wound Cleansing - multiple wounds X- 1  5 Wound Imaging (photographs - any number of wounds) []  - 0 Wound Tracing (instead of photographs) X- 1 5 Simple  Wound Measurement - one wound []  - 0 Complex Wound Measurement - multiple wounds INTERVENTIONS - Wound Dressings []  - Small Wound Dressing one or multiple wounds 0 []  - 0 Medium Wound Dressing one or multiple wounds X- 1 20 Large Wound Dressing one or multiple wounds []  - 0 Application of Medications - topical []  - 0 Application of Medications - injection INTERVENTIONS - Miscellaneous []  - External ear exam 0 []  - 0 Specimen Collection (cultures, biopsies, blood, body fluids, etc.) []  - 0 Specimen(s) / Culture(s) sent or taken to Lab for analysis []  - 0 Patient Transfer (multiple staff / Civil Service fast streamer / Similar devices) []  - 0 Simple Staple / Suture removal (25 or less) []  - 0 Complex Staple / Suture removal (26 or more) []  - 0 Hypo / Hyperglycemic Management (close monitor of Blood Glucose) []  - 0 Ankle / Brachial Index (ABI) - do not check if billed separately X- 1 5 Vital Signs [name], [name] ([id number]) Has the patient been seen at the hospital within the last three years: Yes Total Score: 65 Level Of Care: New/Established - Level 2 Electronic Signature(s) Signed: 03/15/2019 5:25:28 PM By: Gretta Cool, BSN, RN, CWS, Kim RN, BSN Entered By: Gretta Cool, BSN, RN, CWS, Kim on 03/15/2019 17:23:32 [name] ([id number]) -------------------------------------------------------------------------------- Encounter Discharge Information Details Patient Name: Glenn Standard B. Date of Service: 03/15/2019 3:45 PM Medical Record Number: [id number] Patient Account Number: [id number] Date of Birth/Sex: 01-May-1956 (63 y.o. M) Treating RN: Army Melia Primary Care Kalub Morillo: Park Liter Other Clinician: Referring Lynx Goodrich: Park Liter Treating Korbyn Vanes/Extender: Beverly Gust in Treatment: 6 Encounter Discharge Information Items Discharge Condition:  Stable Ambulatory Status: Wheelchair Discharge Destination: Home Transportation: Private Auto Accompanied By: wife Schedule Follow-up Appointment: Yes Clinical Summary of Care: Electronic Signature(s) Signed: 03/15/2019 4:30:15 PM By: Army Melia Entered By: Army Melia on 03/15/2019 16:30:14 Krack, [name] ([id number]) -------------------------------------------------------------------------------- Lower Extremity Assessment Details Patient Name: Glenn Standard B. Date of Service: 03/15/2019 3:45 PM Medical Record Number: [id number] Patient Account Number: [id number] Date of Birth/Sex: 07-Feb-1956 (63 y.o. M) Treating RN: Army Melia Primary Care Margert Edsall: Park Liter Other Clinician: Referring Orbie Grupe: Park Liter Treating Solomon Skowronek/Extender: Beverly Gust in Treatment: 6 Electronic Signature(s) Signed: 03/15/2019 4:29:12 PM By: Army Melia Entered By: Army Melia on 03/15/2019 15:55:37 Hellmann, [name] ([id number]) -------------------------------------------------------------------------------- Multi Wound Chart Details Patient Name: Glenn Standard B. Date of Service: 03/15/2019 3:45 PM Medical Record Number: [id number] Patient Account Number: [id number] Date of Birth/Sex: 07-29-1956 (63 y.o. 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 Date of Service: 03/15/2019 3:45 PM Medical Record Number: [id number] Patient Account Number: [id number] Date of Birth/Sex: 1955-10-11 (63 y.o. M) Treating RN: Cornell Barman Primary Care Latash Nouri: Park Liter Other Clinician: Referring Laymon Stockert: Park Liter Treating Rogers Ditter/Extender: Beverly Gust in Treatment: 6 Active Inactive Electronic Signature(s) Signed: 03/27/2019 2:26:57 PM By: Gretta Cool, BSN, RN, CWS, Kim RN, BSN Previous Signature: 03/15/2019 5:22:04 PM Version By: Gretta Cool, BSN, RN, CWS, Kim RN, BSN Entered By: Gretta Cool, BSN, RN, CWS, Kim on 03/27/2019 14:26:56 Kreger, [name]  ([id number]) -------------------------------------------------------------------------------- Pain Assessment Details Patient Name: [name], [name] B. Date of Service: 03/15/2019 3:45 PM Medical Record Number: [id number] Patient Account Number: [id number] Date of Birth/Sex: 04/24/1956 (63 y.o. M) Treating RN: Army Melia Primary Care Deshawna Mcneece: Park Liter Other Clinician: Referring Jyden Kromer: Park Liter Treating [name]/Extender: Beverly Gust in Treatment: 6 Active Problems Location of Pain Severity and Description of Pain Patient Has Paino Yes Site Locations Rate the pain. Current Pain Level: 4 Pain Management and Medication Current Pain Management: Electronic Signature(s) Signed: 03/15/2019 4:29:12 PM By: Army Melia Entered By: Army Melia on 03/15/2019 15:48:21 Geddes, [name] ([id number]) -------------------------------------------------------------------------------- Patient/Caregiver Education Details Patient Name: Glenn Standard B. Date of Service: 03/15/2019 3:45 PM Medical Record Number: [id number] Patient Account Number: [id number] Date of Birth/Gender: 03/04/1956 (63 y.o. M) Treating RN: Cornell Barman Primary Care Physician: Park Liter Other Clinician: Referring Physician: Park Liter Treating Physician/Extender: Beverly Gust in Treatment: 6 Education Assessment Education Provided To: Patient Education Topics Provided Wound/Skin Impairment: Handouts: Caring for Your Ulcer Methods: Demonstration, Explain/Verbal Responses: Refused information Notes Patient and wife encouraged to move forward with above the knee amputation. Electronic Signature(s) Signed: 03/15/2019 5:25:28 PM By: Gretta Cool, BSN, RN, CWS, Kim RN, BSN Entered By: Gretta Cool, BSN, RN, CWS, Kim on 03/15/2019 17:24:34 Legere, [name] ([id number]) -------------------------------------------------------------------------------- Wound Assessment Details Patient Name: [name], [name]  B. Date of Service: 03/15/2019 3:45 PM Medical Record Number: [id number] Patient Account Number: [id number] Date of Birth/Sex: Oct 15, 1955 (63 y.o. 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 Wounding Event: Surgical Injury Etiology: Date Acquired: 01/31/2018 Wound Status: Open Weeks Of Treatment: 6 Comorbid Arrhythmia, Hypertension, Peripheral Arterial Clustered Wound: No History: Disease, Type II Diabetes, Dementia Pending Amputation On Presentation Photos Wound Measurements Length: (cm) 12 Width: (cm) 12 Depth: (cm) 0.1 Area: (cm) 113.097 Volume: (cm) 11.31 % Reduction in Area: 23.6% % Reduction in Volume: 23.6% Epithelialization: None Tunneling: No Undermining: No Wound Description Classification: Grade 2 Foul Od Wound Margin: Flat and Intact Slough/ Exudate Amount: Large Exudate Type: Serous Exudate Color: amber or After Cleansing: No Fibrino Yes Wound Bed Granulation Amount: Medium (34-66%) Exposed Structure Granulation Quality: Pink  Fascia Exposed: No Necrotic Amount: Medium (34-66%) Fat Layer (Subcutaneous Tissue) Exposed: Yes Necrotic Quality: Adherent Slough Tendon Exposed: No Muscle Exposed: No Joint Exposed: No Bone Exposed: No Roudebush, [name] ([id number]) Electronic Signature(s) Signed: 03/15/2019 4:29:12 PM By: Army Melia Entered By: Army Melia on 03/15/2019 15:55:05 Butkus, [name] ([id number]) -------------------------------------------------------------------------------- Vitals Details Patient Name: Glenn Standard B. Date of Service: 03/15/2019 3:45 PM Medical Record Number: [id number] Patient Account Number:  [id number] Date of Birth/Sex: 20-Dec-1955 (63 y.o. M) Treating RN: Army Melia Primary Care Larua Collier: Park Liter Other Clinician: Referring Sargun Rummell: Park Liter Treating Kylyn Mcdade/Extender: Beverly Gust in Treatment: 6 Vital Signs Time Taken: 15:48 Temperature (F): 99.1 Height (in): 70 Pulse (bpm): 75 Weight (lbs): 175 Respiratory Rate (breaths/min): 16 Body Mass Index (BMI): 25.1 Blood Pressure (mmHg): 104/75 Reference Range: 80 - 120 mg / dl Electronic Signature(s) Signed: 03/15/2019 4:29:12 PM By: Army Melia Entered By: Army Melia on 03/15/2019 15:48:45

## 2019-03-17 ENCOUNTER — Encounter (INDEPENDENT_AMBULATORY_CARE_PROVIDER_SITE_OTHER): Payer: Self-pay

## 2019-03-17 ENCOUNTER — Ambulatory Visit (INDEPENDENT_AMBULATORY_CARE_PROVIDER_SITE_OTHER): Payer: Self-pay | Admitting: Vascular Surgery

## 2019-03-17 NOTE — Telephone Encounter (Signed)
Patient wife has been inform with medical advice from both providers and verbalize understanding

## 2019-03-17 NOTE — Telephone Encounter (Signed)
We can try to place a referral to the wound center at [facility].  After speaking with Dr. Lucky Cowboy, he doesn't feel that blood flow is an issue in this instance but more so delayed wound healing.  Given this delayed wound healing, he does agree that a stump revision would be the best option to try to get the wound to heal faster.  If we did revise it would need to be an AKA.  We will place the referral and we can discuss the next options after he has seen them.

## 2019-03-19 ENCOUNTER — Encounter (INDEPENDENT_AMBULATORY_CARE_PROVIDER_SITE_OTHER): Payer: Self-pay | Admitting: Nurse Practitioner

## 2019-03-19 NOTE — Progress Notes (Signed)
SUBJECTIVE:  Patient ID: Glenn Gerold., male    DOB: 11-13-1955, 63 y.o.   MRN: [id number] Chief Complaint  Patient presents with   Follow-up    6week follow up    HPI  [name]. is a 63 y.o. male that presents today for follow-up regarding his right below-knee amputation.  The patient has been working with the wound center to heal his wound.  However despite multiple treatments the wound continues to be resistant to healing.  The wound center has advised revision of the patient's wound.  The patient however at this time is highly resistant to this idea.  He does not wish to proceed with any revision at this time.  The patient wishes to obtain a second opinion at a different wound center before proceeding.  Past Medical History:  Diagnosis Date   Acute left PCA stroke ([place]) 02/17/2015   Anemia    Atherosclerosis of native arteries of the extremities with gangrene ([place]) 05/08/2018   Atrial fibrillation ([place])    Blind    right eye   Diabetes mellitus with complication ([place])    Dilated aortic root ([place])    a. 04/2018 Echo: 4.1cm. Asc Ao 3.5cm.   Dysrhythmia    a fib   Gangrene of right foot ([place]) 06/02/2018   GERD (gastroesophageal reflux disease)    History of echocardiogram    a. 04/2018 Echo: Ef 60-65%, no rwma, midly to mod dil Ao root - 4.1cm. Asc Ao 3.5cm. Mild MR. Nl RV fxn. Nl PASP.   History of hernia repair    History of stress test    a. 2016 MV ([name]): EF 58%, no ischemia.   Hyperlipidemia    Hypertension    Leg pain    Legally blind    Necrotic toes ([place]) 02/21/2018   PAD (peripheral artery disease) ([name])    PAF (paroxysmal atrial fibrillation) ([name])    a. on Eliquis as of 2018; b. CHADS2VASc => 5 (HTN, DM, stroke x 2, vascular disease)   Peripheral vascular disease ([place])    a. followed by Dr. Lucky Cowboy; b. s/p kissing balloon stents and right external iliac stent in 10/2017; c. LE angiogrpahy 01/2018: No significant arterial occlusive  disease in the lower extremities.   Pulmonary nodules    Stroke [facility])    a. 2016 & 2018   Stroke ([place]) 07/11/2018    Past Surgical History:  Procedure Laterality Date   AMPUTATION Right 06/02/2018   Procedure: AMPUTATION BELOW KNEE;  Surgeon: [name], MD;  Location: [facility] ORS;  Service: Vascular;  Laterality: Right;   APPENDECTOMY     COLONOSCOPY WITH PROPOFOL N/A 05/12/2018   Procedure: COLONOSCOPY WITH PROPOFOL;  Surgeon: [name], MD;  Location: [facility] ENDOSCOPY;  Service: Gastroenterology;  Laterality: N/A;   ESOPHAGOGASTRODUODENOSCOPY (EGD) WITH PROPOFOL N/A 05/12/2018   Procedure: ESOPHAGOGASTRODUODENOSCOPY (EGD) WITH PROPOFOL;  Surgeon: [name], MD;  Location: [facility] ENDOSCOPY;  Service: Gastroenterology;  Laterality: N/A;   GIVENS CAPSULE STUDY N/A 07/13/2018   Procedure: GIVENS CAPSULE STUDY;  Surgeon: [name], MD;  Location: [facility] ENDOSCOPY;  Service: Gastroenterology;  Laterality: N/A;   HERNIA REPAIR     UMBILICAL   LOWER EXTREMITY ANGIOGRAPHY Right 10/25/2017   Procedure: LOWER EXTREMITY ANGIOGRAPHY;  Surgeon: [name], MD;  Location: [place] CV LAB;  Service: Cardiovascular;  Laterality: Right;   LOWER EXTREMITY ANGIOGRAPHY Right 01/13/2018   Procedure: LOWER EXTREMITY ANGIOGRAPHY;  Surgeon: [name], MD;  Location: [facility]  INVASIVE CV LAB;  Service: Cardiovascular;  Laterality: Right;   LOWER EXTREMITY INTERVENTION  10/25/2017   Procedure: LOWER EXTREMITY INTERVENTION;  Surgeon: [name], MD;  Location: [place] CV LAB;  Service: Cardiovascular;;   SKIN SPLIT GRAFT Right 12/28/2018   Procedure: SKIN GRAFT SPLIT THICKNESS ( SYNTHETIC );  Surgeon: [name], MD;  Location: [facility] ORS;  Service: Vascular;  Laterality: Right;   TONSILLECTOMY     WOUND DEBRIDEMENT Right 08/08/2018   Procedure: DEBRIDEMENT WOUND WITH WOUND VAC APPLICATION;  Surgeon: [name], MD;  Location: [facility] ORS;  Service: Vascular;  Laterality: Right;    Social  History   Socioeconomic History   Marital status: Married    Spouse name: Not on file   Number of children: 0   Years of education: Not on file   Highest education level: Associate degree: academic program  Occupational History   Not on file  Social Needs   Financial resource strain: Somewhat hard   Food insecurity    Worry: Never true    Inability: Never true   Transportation needs    Medical: No    Non-medical: No  Tobacco Use   Smoking status: Former Smoker    Packs/day: 1.00    Years: 30.00    Pack years: 30.00    Types: Cigarettes    Quit date: 01/09/2018    Years since quitting: 1.1   Smokeless tobacco: Never Used  Substance and Sexual Activity   Alcohol use: Never    Alcohol/week: 0.0 standard drinks    Frequency: Never    Comment: occasionally beer    Drug use: No   Sexual activity: Yes  Lifestyle   Physical activity    Days per week: 0 days    Minutes per session: 0 min   Stress: Not at all  Relationships   Social connections    Talks on phone: More than three times a week    Gets together: Never    Attends religious service: Never    Active member of club or organization: No    Attends meetings of clubs or organizations: Never    Relationship status: Married   Intimate partner violence    Fear of current or ex partner: No    Emotionally abused: No    Physically abused: No    Forced sexual activity: No  Other Topics Concern   Not on file  Social History Narrative   Not on file    Family History  Problem Relation Age of Onset   Diabetes Father    Hypertension Father    Hyperlipidemia Father     Allergies  Allergen Reactions   Sodium Pentobarbital [Pentobarbital] Shortness Of Breath   Lipitor [Atorvastatin] Rash     Review of Systems   Review of Systems: Negative Unless Checked Constitutional: [] Weight loss  [] Fever  [] Chills Cardiac: [] Chest pain   []  Atrial Fibrillation  [] Palpitations   [] Shortness of breath  when laying flat   [] Shortness of breath with exertion. [] Shortness of breath at rest Vascular:  [] Pain in legs with walking   [] Pain in legs with standing [] Pain in legs when laying flat   [] Claudication    [] Pain in feet when laying flat    [] History of DVT   [] Phlebitis   [] Swelling in legs   [] Varicose veins   [x] Non-healing ulcers Pulmonary:   [] Uses home oxygen   [] Productive cough   [] Hemoptysis   [] Wheeze  [] COPD   [] Asthma Neurologic:  []   Dizziness   [] Seizures  [] Blackouts [x] History of stroke   [] History of TIA  [] Aphasia   [] Temporary Blindness   [] Weakness or numbness in arm   [] Weakness or numbness in leg Musculoskeletal:   [] Joint swelling   [] Joint pain   [] Low back pain  []  History of Knee Replacement [] Arthritis [] back Surgeries  []  Spinal Stenosis    Hematologic:  [] Easy bruising  [] Easy bleeding   [] Hypercoagulable state   [] Anemic Gastrointestinal:  [] Diarrhea   [] Vomiting  [] Gastroesophageal reflux/heartburn   [] Difficulty swallowing. [] Abdominal pain Genitourinary:  [] Chronic kidney disease   [] Difficult urination  [] Anuric   [] Blood in urine [] Frequent urination  [] Burning with urination   [] Hematuria Skin:  [] Rashes   [] Ulcers [] Wounds Psychological:  [] History of anxiety   []  History of major depression  [x]  Memory Difficulties      OBJECTIVE:   Physical Exam  BP 108/70 (BP Location: Right Arm)    Pulse 80    Resp 16   Gen: WD/WN, NAD Head: [place]/AT, No temporalis wasting.  Ear/Nose/Throat: Hearing grossly intact, nares w/o erythema or drainage Eyes: PER, EOMI, sclera nonicteric.  Neck: Supple, no masses.  No JVD.  Pulmonary:  Good air movement, no use of accessory muscles.  Cardiac: RRR Vascular:  Nonhealing right below-knee amputation Vessel Right Left  Radial Palpable Palpable  Gastrointestinal: soft, non-distended. No guarding/no peritoneal signs.  Musculoskeletal: R BKA  No deformity or atrophy.  Neurologic: Pain and light touch intact in extremities.   Symmetrical.  Speech is fluent. Motor exam as listed above. Psychiatric: Judgment intact, Mood & affect appropriate for pt's clinical situation. Dermatologic: No Venous rashes. No Ulcers Noted.  No changes consistent with cellulitis. Lymph : No Cervical lymphadenopathy, no lichenification or skin changes of chronic lymphedema.       ASSESSMENT AND PLAN:  1. Hx of BKA, right ([place]) The patient wishes a second opinion at a different wound care center.  We will place referral to the [place] wound care center.  We will schedule the patient to return to the office in 8 weeks to allow him time to visit the new wound care center to get a second opinion and then possibly talk about revision versus continuing with the wound healing process.  Previous noninvasive study showed that the patient had adequate perfusion for wound healing.  2. 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  Hgb A1C to be monitored as already arranged by primary service   3. Mixed hyperlipidemia Continue statin as ordered and reviewed, no changes at this time    Current Outpatient Medications on File Prior to Visit  Medication Sig Dispense Refill   acetaminophen (TYLENOL) 500 MG tablet Take 1,000 mg by mouth every 6 (six) hours as needed (for pain.).     apixaban (ELIQUIS) 5 MG TABS tablet Take 1 tablet (5 mg total) by mouth 2 (two) times daily. 180 tablet 1   aspirin 81 MG EC tablet Take 1 tablet (81 mg total) by mouth daily. 90 tablet 4   Biotin (BIOTIN 5000) 5 MG CAPS Take 1 capsule by mouth daily.     Calcium Carb-Cholecalciferol (CALCIUM 600-D PO) Take by mouth daily.     cholecalciferol (VITAMIN D3) 25 MCG (1000 UT) tablet Take 5,000 Units by mouth daily.      docusate sodium (COLACE) 100 MG capsule Take 1 capsule (100 mg total)  by mouth daily. Do not take if  you have loose stools 90 capsule 2   donepezil (ARICEPT) 5 MG tablet Take 5 mg by mouth at bedtime.     empagliflozin (JARDIANCE) 10 MG TABS tablet Take 5 mg by mouth daily. 90 tablet 1   ezetimibe-simvastatin (VYTORIN) 10-40 MG tablet Take 1 tablet by mouth daily at 6 PM. 90 tablet 2   gabapentin (NEURONTIN) 800 MG tablet Take 1 tablet (800 mg total) by mouth 3 (three) times daily. 270 tablet 1   hydrochlorothiazide (MICROZIDE) 12.5 MG capsule Take 1 capsule (12.5 mg total) by mouth daily. 45 capsule 1   HYDROcodone-acetaminophen (NORCO) 5-325 MG tablet Take 1 tablet by mouth every 6 (six) hours as needed for moderate pain. (Patient not taking: Reported on 03/14/2019) 30 tablet 0   lansoprazole (PREVACID) 30 MG capsule Take 30 mg by mouth daily at 12 noon.     metFORMIN (GLUCOPHAGE) 1000 MG tablet Take 1 tablet (1,000 mg total) by mouth 2 (two) times daily with a meal. 180 tablet 3   metoprolol tartrate (LOPRESSOR) 25 MG tablet Take 1 tablet (25 mg total) by mouth 2 (two) times daily. 180 tablet 3   Multiple Vitamin (MULTIVITAMIN WITH MINERALS) TABS tablet Take 1 tablet by mouth daily. 30 tablet 0   ONE TOUCH ULTRA TEST test strip USE TO TEST BLOOD SUGAR BID 100 each 12   ONETOUCH DELICA LANCETS 29F MISC USE AS DIRECTED TO TEST BLOOD SUGAR BID 100 each 12   traMADol (ULTRAM) 50 MG tablet Take 1 tablet (50 mg total) by mouth every 12 (twelve) hours as needed. 30 tablet 0   vitamin B-12 (CYANOCOBALAMIN) 1000 MCG tablet Take 1,000 mcg by mouth daily.     vitamin C (ASCORBIC ACID) 500 MG tablet Take 500 mg by mouth daily.     No current facility-administered medications on file prior to visit.     There are no Patient Instructions on file for this visit. No follow-ups on file.   Kris Hartmann, NP  This note was completed with Sales executive.  Any errors are purely unintentional.

## 2019-03-20 NOTE — Telephone Encounter (Signed)
Debra-pt spouse- calling bc referral for wound center was sent to [facility] wound center and they would like to be sent to [facility]. Please advise. AS, CMA

## 2019-03-21 NOTE — Telephone Encounter (Signed)
Left message to advise that the referral has been fixed. AS, CMA

## 2019-03-21 NOTE — Telephone Encounter (Signed)
We have corrected that and sent referral to [facility]

## 2019-03-23 DIAGNOSIS — T8789 Other complications of amputation stump: Secondary | ICD-10-CM | POA: Diagnosis not present

## 2019-03-23 DIAGNOSIS — T8189XA Other complications of procedures, not elsewhere classified, initial encounter: Secondary | ICD-10-CM | POA: Diagnosis not present

## 2019-03-23 DIAGNOSIS — R600 Localized edema: Secondary | ICD-10-CM | POA: Insufficient documentation

## 2019-03-23 DIAGNOSIS — Z89511 Acquired absence of right leg below knee: Secondary | ICD-10-CM | POA: Diagnosis not present

## 2019-03-23 DIAGNOSIS — Y838 Other surgical procedures as the cause of abnormal reaction of the patient, or of later complication, without mention of misadventure at the time of the procedure: Secondary | ICD-10-CM | POA: Diagnosis not present

## 2019-03-23 DIAGNOSIS — R69 Illness, unspecified: Secondary | ICD-10-CM | POA: Diagnosis not present

## 2019-03-28 DIAGNOSIS — S98921A Partial traumatic amputation of right foot, level unspecified, initial encounter: Secondary | ICD-10-CM | POA: Diagnosis not present

## 2019-03-28 DIAGNOSIS — Z89511 Acquired absence of right leg below knee: Secondary | ICD-10-CM | POA: Diagnosis not present

## 2019-03-29 DIAGNOSIS — Z89511 Acquired absence of right leg below knee: Secondary | ICD-10-CM | POA: Diagnosis not present

## 2019-03-29 DIAGNOSIS — S98921A Partial traumatic amputation of right foot, level unspecified, initial encounter: Secondary | ICD-10-CM | POA: Diagnosis not present

## 2019-03-31 DIAGNOSIS — Z89511 Acquired absence of right leg below knee: Secondary | ICD-10-CM | POA: Diagnosis not present

## 2019-04-13 DIAGNOSIS — B369 Superficial mycosis, unspecified: Secondary | ICD-10-CM | POA: Diagnosis not present

## 2019-04-13 DIAGNOSIS — E1142 Type 2 diabetes mellitus with diabetic polyneuropathy: Secondary | ICD-10-CM | POA: Diagnosis not present

## 2019-04-13 DIAGNOSIS — Z89511 Acquired absence of right leg below knee: Secondary | ICD-10-CM | POA: Diagnosis not present

## 2019-04-13 DIAGNOSIS — T8789 Other complications of amputation stump: Secondary | ICD-10-CM | POA: Diagnosis not present

## 2019-04-13 DIAGNOSIS — Y835 Amputation of limb(s) as the cause of abnormal reaction of the patient, or of later complication, without mention of misadventure at the time of the procedure: Secondary | ICD-10-CM | POA: Diagnosis not present

## 2019-04-17 ENCOUNTER — Other Ambulatory Visit: Payer: Self-pay | Admitting: Family Medicine

## 2019-04-17 DIAGNOSIS — S98921A Partial traumatic amputation of right foot, level unspecified, initial encounter: Secondary | ICD-10-CM | POA: Diagnosis not present

## 2019-04-17 DIAGNOSIS — Z89511 Acquired absence of right leg below knee: Secondary | ICD-10-CM | POA: Diagnosis not present

## 2019-04-17 NOTE — Telephone Encounter (Signed)
Requested medication (s) are due for refill today: yes  Requested medication (s) are on the active medication list: yes  Last refill:  01/16/2019  Future visit scheduled: yes  Notes to clinic: review for refill Patient also on Eliquis   Requested Prescriptions  Pending Prescriptions Disp Refills   aspirin 81 MG EC tablet [Pharmacy Med Name: [facility] ASPIRIN EC 81 MG TABLET] 90 tablet 4    Sig: TAKE 1 TABLET BY MOUTH EVERY DAY     Analgesics:  NSAIDS - aspirin Passed - 04/17/2019  1:40 AM      Passed - Patient is not pregnant      Passed - Valid encounter within last 12 months    Recent Outpatient Visits          3 months ago 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

## 2019-04-20 DIAGNOSIS — R69 Illness, unspecified: Secondary | ICD-10-CM | POA: Diagnosis not present

## 2019-05-11 DIAGNOSIS — T8189XA Other complications of procedures, not elsewhere classified, initial encounter: Secondary | ICD-10-CM | POA: Diagnosis not present

## 2019-05-11 DIAGNOSIS — T8789 Other complications of amputation stump: Secondary | ICD-10-CM | POA: Diagnosis not present

## 2019-05-11 DIAGNOSIS — Z89511 Acquired absence of right leg below knee: Secondary | ICD-10-CM | POA: Diagnosis not present

## 2019-05-11 DIAGNOSIS — E1142 Type 2 diabetes mellitus with diabetic polyneuropathy: Secondary | ICD-10-CM | POA: Diagnosis not present

## 2019-05-11 DIAGNOSIS — Y838 Other surgical procedures as the cause of abnormal reaction of the patient, or of later complication, without mention of misadventure at the time of the procedure: Secondary | ICD-10-CM | POA: Diagnosis not present

## 2019-05-23 DIAGNOSIS — Z89511 Acquired absence of right leg below knee: Secondary | ICD-10-CM | POA: Diagnosis not present

## 2019-05-23 DIAGNOSIS — S98921A Partial traumatic amputation of right foot, level unspecified, initial encounter: Secondary | ICD-10-CM | POA: Diagnosis not present

## 2019-05-24 DIAGNOSIS — Z89511 Acquired absence of right leg below knee: Secondary | ICD-10-CM | POA: Diagnosis not present

## 2019-05-24 DIAGNOSIS — S98921A Partial traumatic amputation of right foot, level unspecified, initial encounter: Secondary | ICD-10-CM | POA: Diagnosis not present

## 2019-06-02 ENCOUNTER — Ambulatory Visit (INDEPENDENT_AMBULATORY_CARE_PROVIDER_SITE_OTHER): Payer: Medicare HMO | Admitting: Vascular Surgery

## 2019-06-02 ENCOUNTER — Other Ambulatory Visit: Payer: Self-pay

## 2019-06-02 ENCOUNTER — Encounter (INDEPENDENT_AMBULATORY_CARE_PROVIDER_SITE_OTHER): Payer: Self-pay | Admitting: Vascular Surgery

## 2019-06-02 VITALS — BP 137/73 | HR 111 | Resp 18 | Ht 69.0 in | Wt 188.0 lb

## 2019-06-02 DIAGNOSIS — Z89511 Acquired absence of right leg below knee: Secondary | ICD-10-CM | POA: Diagnosis not present

## 2019-06-02 DIAGNOSIS — I480 Paroxysmal atrial fibrillation: Secondary | ICD-10-CM | POA: Diagnosis not present

## 2019-06-02 DIAGNOSIS — E1142 Type 2 diabetes mellitus with diabetic polyneuropathy: Secondary | ICD-10-CM

## 2019-06-02 DIAGNOSIS — E782 Mixed hyperlipidemia: Secondary | ICD-10-CM | POA: Diagnosis not present

## 2019-06-02 NOTE — Assessment & Plan Note (Signed)
Right BKA wound is improving although slowly.  Continue the excellent care at the wound care center.  We will see him back in about 3 months.

## 2019-06-02 NOTE — Progress Notes (Signed)
MRN : [id number]  [name]. is a 63 y.o. ([birth year]) male who presents with chief complaint of  Chief Complaint  Patient presents with  . Follow-up    12 wk f/u no studies  .  History of Present Illness: Patient returns today in follow up of his right BKA wound.  This is almost a year old now.  He had a very large wound which has contracted significantly with the help of the wound care center and their excellent care.  There is no signs of infection.  He is no longer having much pain.  He has an excellent bed of granulation tissue  Current Outpatient Medications  Medication Sig Dispense Refill  . acetaminophen (TYLENOL) 500 MG tablet Take 1,000 mg by mouth every 6 (six) hours as needed (for pain.).    Marland Kitchen apixaban (ELIQUIS) 5 MG TABS tablet Take 1 tablet (5 mg total) by mouth 2 (two) times daily. 180 tablet 1  . aspirin 81 MG EC tablet TAKE 1 TABLET BY MOUTH EVERY DAY 90 tablet 4  . cholecalciferol (VITAMIN D3) 25 MCG (1000 UT) tablet Take 5,000 Units by mouth daily.     Marland Kitchen docusate sodium (COLACE) 100 MG capsule Take 1 capsule (100 mg total) by mouth daily. Do not take if you have loose stools 90 capsule 2  . empagliflozin (JARDIANCE) 10 MG TABS tablet Take 5 mg by mouth daily. 90 tablet 1  . ezetimibe-simvastatin (VYTORIN) 10-40 MG tablet Take 1 tablet by mouth daily at 6 PM. 90 tablet 2  . ferrous sulfate (FERROUSUL) 325 (65 FE) MG tablet Take 1 tablet (325 mg total) by mouth 2 (two) times daily with a meal. 120 tablet 3  . gabapentin (NEURONTIN) 800 MG tablet Take 1 tablet (800 mg total) by mouth 3 (three) times daily. 270 tablet 1  . hydrochlorothiazide (MICROZIDE) 12.5 MG capsule Take 1 capsule (12.5 mg total) by mouth daily. 45 capsule 1  . metFORMIN (GLUCOPHAGE) 1000 MG tablet Take 1 tablet (1,000 mg total) by mouth 2 (two) times daily with a meal. 180 tablet 3  . metoprolol tartrate (LOPRESSOR) 25 MG tablet Take 1 tablet (25 mg total) by mouth 2 (two) times daily. 180  tablet 3  . Multiple Vitamin (MULTIVITAMIN WITH MINERALS) TABS tablet Take 1 tablet by mouth daily. 30 tablet 0  . ONE TOUCH ULTRA TEST test strip USE TO TEST BLOOD SUGAR BID 100 each 12  . ONETOUCH DELICA LANCETS 99991111 MISC USE AS DIRECTED TO TEST BLOOD SUGAR BID 100 each 12  . vitamin B-12 (CYANOCOBALAMIN) 1000 MCG tablet Take 1,000 mcg by mouth daily.    . vitamin C (ASCORBIC ACID) 500 MG tablet Take 500 mg by mouth daily.    Marland Kitchen amoxicillin (AMOXIL) 500 MG capsule Take 1 capsule (500 mg total) by mouth 3 (three) times daily. (Patient not taking: Reported on 06/02/2019) 15 capsule 0  . Biotin (BIOTIN 5000) 5 MG CAPS Take 1 capsule by mouth daily.    . Calcium Carb-Cholecalciferol (CALCIUM 600-D PO) Take by mouth daily.    Marland Kitchen donepezil (ARICEPT) 5 MG tablet Take 5 mg by mouth at bedtime.    Marland Kitchen HYDROcodone-acetaminophen (NORCO) 5-325 MG tablet Take 1 tablet by mouth every 6 (six) hours as needed for moderate pain. (Patient not taking: Reported on 03/14/2019) 30 tablet 0  . lansoprazole (PREVACID) 30 MG capsule Take 30 mg by mouth daily at 12 noon.    . traMADol (ULTRAM) 50 MG tablet Take  1 tablet (50 mg total) by mouth every 12 (twelve) hours as needed. (Patient not taking: Reported on 06/02/2019) 30 tablet 0   No current facility-administered medications for this visit.     Past Medical History:  Diagnosis Date  . Acute left PCA stroke ([place]) 02/17/2015  . Anemia   . Atherosclerosis of native arteries of the extremities with gangrene ([place]) 05/08/2018  . Atrial fibrillation ([place])   . Blind    right eye  . Diabetes mellitus with complication ([place])   . Dilated aortic root ([place])    a. 04/2018 Echo: 4.1cm. Asc Ao 3.5cm.  Marland Kitchen Dysrhythmia    a fib  . Gangrene of right foot ([place]) 06/02/2018  . GERD (gastroesophageal reflux disease)   . History of echocardiogram    a. 04/2018 Echo: Ef 60-65%, no rwma, midly to mod dil Ao root - 4.1cm. Asc Ao 3.5cm. Mild MR. Nl RV fxn. Nl PASP.  Marland Kitchen History of hernia  repair   . History of stress test    a. 2016 MV ([name]): EF 58%, no ischemia.  . Hyperlipidemia   . Hypertension   . Leg pain   . Legally blind   . Necrotic toes ([place]) 02/21/2018  . PAD (peripheral artery disease) ([place])   . PAF (paroxysmal atrial fibrillation) ([name])    a. on Eliquis as of 2018; b. CHADS2VASc => 5 (HTN, DM, stroke x 2, vascular disease)  . Peripheral vascular disease ([place])    a. followed by Dr. Lucky Cowboy; b. s/p kissing balloon stents and right external iliac stent in 10/2017; c. LE angiogrpahy 01/2018: No significant arterial occlusive disease in the lower extremities.  . Pulmonary nodules   . Stroke [facility])    a. 2016 & 2018  . Stroke [facility]) 07/11/2018    Past Surgical History:  Procedure Laterality Date  . AMPUTATION Right 06/02/2018   Procedure: AMPUTATION BELOW KNEE;  Surgeon: [name], MD;  Location: [facility] ORS;  Service: Vascular;  Laterality: Right;  . APPENDECTOMY    . COLONOSCOPY WITH PROPOFOL N/A 05/12/2018   Procedure: COLONOSCOPY WITH PROPOFOL;  Surgeon: [name], MD;  Location: [facility] ENDOSCOPY;  Service: Gastroenterology;  Laterality: N/A;  . ESOPHAGOGASTRODUODENOSCOPY (EGD) WITH PROPOFOL N/A 05/12/2018   Procedure: ESOPHAGOGASTRODUODENOSCOPY (EGD) WITH PROPOFOL;  Surgeon: [name], MD;  Location: [facility] ENDOSCOPY;  Service: Gastroenterology;  Laterality: N/A;  . GIVENS CAPSULE STUDY N/A 07/13/2018   Procedure: GIVENS CAPSULE STUDY;  Surgeon: [name], MD;  Location: [facility] ENDOSCOPY;  Service: Gastroenterology;  Laterality: N/A;  . HERNIA REPAIR     UMBILICAL  . LOWER EXTREMITY ANGIOGRAPHY Right 10/25/2017   Procedure: LOWER EXTREMITY ANGIOGRAPHY;  Surgeon: [name], MD;  Location: [place] CV LAB;  Service: Cardiovascular;  Laterality: Right;  . LOWER EXTREMITY ANGIOGRAPHY Right 01/13/2018   Procedure: LOWER EXTREMITY ANGIOGRAPHY;  Surgeon: [name], MD;  Location: [place] CV LAB;  Service: Cardiovascular;  Laterality: Right;  . LOWER  EXTREMITY INTERVENTION  10/25/2017   Procedure: LOWER EXTREMITY INTERVENTION;  Surgeon: [name], MD;  Location: [place] CV LAB;  Service: Cardiovascular;;  . SKIN SPLIT GRAFT Right 12/28/2018   Procedure: SKIN GRAFT SPLIT THICKNESS ( SYNTHETIC );  Surgeon: [name], MD;  Location: [facility] ORS;  Service: Vascular;  Laterality: Right;  . TONSILLECTOMY    . WOUND DEBRIDEMENT Right 08/08/2018   Procedure: DEBRIDEMENT WOUND WITH WOUND VAC APPLICATION;  Surgeon: [name], MD;  Location: [facility] ORS;  Service: Vascular;  Laterality:  Right;    Social History Social History   Tobacco Use  . Smoking status: Former Smoker    Packs/day: 1.00    Years: 30.00    Pack years: 30.00    Types: Cigarettes    Quit date: 01/09/2018    Years since quitting: 1.3  . Smokeless tobacco: Never Used  Substance Use Topics  . Alcohol use: Never    Alcohol/week: 0.0 standard drinks    Frequency: Never    Comment: occasionally beer   . Drug use: No     Family History Family History  Problem Relation Age of Onset  . Diabetes Father   . Hypertension Father   . Hyperlipidemia Father   no bleeding or clotting disorders  Allergies  Allergen Reactions  . Sodium Pentobarbital [Pentobarbital] Shortness Of Breath  . Lipitor [Atorvastatin] Rash     REVIEW OF SYSTEMS (Negative unless checked)  Constitutional: [] ?Weight loss  [] ?Fever  [] ?Chills Cardiac: [] ?Chest pain   [] ?Chest pressure   [] ?Palpitations   [] ?Shortness of breath when laying flat   [] ?Shortness of breath at rest   [] ?Shortness of breath with exertion. Vascular:  [] ?Pain in legs with walking   [] ?Pain in legs at rest   [] ?Pain in legs when laying flat   [] ?Claudication   [] ?Pain in feet when walking  [] ?Pain in feet at rest  [] ?Pain in feet when laying flat   [] ?History of DVT   [] ?Phlebitis   [] ?Swelling in legs   [] ?Varicose veins   [x] ?Non-healing ulcers Pulmonary:   [] ?Uses home oxygen   [] ?Productive cough   [] ?Hemoptysis    [] ?Wheeze  [] ?COPD   [] ?Asthma Neurologic:  [] ?Dizziness  [] ?Blackouts   [] ?Seizures   [x] ?History of stroke   [] ?History of TIA  [] ?Aphasia   [] ?Temporary blindness   [] ?Dysphagia   [] ?Weakness or numbness in arms   [] ?Weakness or numbness in legs Musculoskeletal:  [x] ?Arthritis   [] ?Joint swelling   [] ?Joint pain   [] ?Low back pain Hematologic:  [] ?Easy bruising  [] ?Easy bleeding   [] ?Hypercoagulable state   [] ?Anemic   Gastrointestinal:  [] ?Blood in stool   [] ?Vomiting blood  [] ?Gastroesophageal reflux/heartburn   [] ?Abdominal pain Genitourinary:  [] ?Chronic kidney disease   [] ?Difficult urination  [] ?Frequent urination  [] ?Burning with urination   [] ?Hematuria Skin:  [] ?Rashes   [x] ?Ulcers   [x] ?Wounds Psychological:  [] ?History of anxiety   [] ? History of major depression.   Physical Examination  BP 137/73 (BP Location: Left Arm)   Pulse (!) 111   Resp 18   Ht 5\' 9"  (1.753 m)   Wt 188 lb (85.3 kg)   BMI 27.76 kg/m  Gen:  WD/WN, NAD Head: [place]/AT, No temporalis wasting. Ear/Nose/Throat: Hearing grossly intact, nares w/o erythema or drainage Eyes: Conjunctiva clear. Sclera non-icteric Neck: Supple.  Trachea midline Pulmonary:  Good air movement, no use of accessory muscles.  Cardiac: RRR, no JVD Vascular:  Vessel Right Left  Radial Palpable Palpable   Musculoskeletal: M/S 5/5 throughout.  No deformity or atrophy.  Mild lower extremity edema. Neurologic: Sensation grossly intact in extremities.  Symmetrical.  Speech is fluent.  Psychiatric: Judgment intact, Mood & affect appropriate for pt's clinical situation. Dermatologic: Moderate sized granulating wound in the right BKA site which has contracted significantly 2 to 3 cm circumferentially from the last time I saw it.  No evidence of erythema or infection       Labs Recent Results (from the past 2160 hour(s))  Ferritin     Status: None  Collection Time: 03/13/19  1:12 PM  Result Value Ref Range   Ferritin 24 24 - 336  ng/mL    Comment: Performed at [facility], [address] [place], [place] [address]  Iron and TIBC     Status: Abnormal   Collection Time: 03/13/19  1:12 PM  Result Value Ref Range   Iron 66 45 - 182 ug/dL   TIBC 405 250 - 450 ug/dL   Saturation Ratios 16 (L) 17.9 - 39.5 %   UIBC 339 ug/dL    Comment: Performed at [facility], [address] [place], [place] [address]  CBC with Differential/Platelet     Status: Abnormal   Collection Time: 03/13/19  1:12 PM  Result Value Ref Range   WBC 7.9 4.0 - 10.5 K/uL   RBC 4.63 4.22 - 5.81 MIL/uL   Hemoglobin 12.5 (L) 13.0 - 17.0 g/dL   HCT 38.7 (L) 39.0 - 52.0 %   MCV 83.6 80.0 - 100.0 fL   MCH 27.0 26.0 - 34.0 pg   MCHC 32.3 30.0 - 36.0 g/dL   RDW 14.3 11.5 - 15.5 %   Platelets 224 150 - 400 K/uL   nRBC 0.0 0.0 - 0.2 %   Neutrophils Relative % 70 %   Neutro Abs 5.6 1.7 - 7.7 K/uL   Lymphocytes Relative 17 %   Lymphs Abs 1.4 0.7 - 4.0 K/uL   Monocytes Relative 7 %   Monocytes Absolute 0.5 0.1 - 1.0 K/uL   Eosinophils Relative 5 %   Eosinophils Absolute 0.4 0.0 - 0.5 K/uL   Basophils Relative 1 %   Basophils Absolute 0.0 0.0 - 0.1 K/uL   Immature Granulocytes 0 %   Abs Immature Granulocytes 0.02 0.00 - 0.07 K/uL    Comment: Performed at [facility], [address], [place], [place] [address]    Radiology No results found.  Assessment/Plan PVD (peripheral vascular disease) ([place]) Perfusion checked and is adequate to the stump with normal triphasic waveforms to the right popliteal artery.  A-fib ([name]) On anticoagulation.   Hyperlipidemia lipid control important in reducing the progression of atherosclerotic disease. Continue statin therapy   Diabetic peripheral neuropathy ([name]) blood glucose control important in reducing the progression of atherosclerotic disease. Also, involved in wound healing. On appropriate medications.  Hx of BKA, right ([place]) Right BKA wound is improving although  slowly.  Continue the excellent care at the wound care center.  We will see him back in about 3 months.    Leotis Pain, MD  06/02/2019 12:54 PM    This note was created with Dragon medical transcription system.  Any errors from dictation are purely unintentional

## 2019-06-05 DIAGNOSIS — T8189XA Other complications of procedures, not elsewhere classified, initial encounter: Secondary | ICD-10-CM | POA: Diagnosis not present

## 2019-06-05 DIAGNOSIS — E1142 Type 2 diabetes mellitus with diabetic polyneuropathy: Secondary | ICD-10-CM | POA: Diagnosis not present

## 2019-06-05 DIAGNOSIS — Y835 Amputation of limb(s) as the cause of abnormal reaction of the patient, or of later complication, without mention of misadventure at the time of the procedure: Secondary | ICD-10-CM | POA: Diagnosis not present

## 2019-06-05 DIAGNOSIS — Z89511 Acquired absence of right leg below knee: Secondary | ICD-10-CM | POA: Diagnosis not present

## 2019-06-08 DIAGNOSIS — S98921A Partial traumatic amputation of right foot, level unspecified, initial encounter: Secondary | ICD-10-CM | POA: Diagnosis not present

## 2019-06-08 DIAGNOSIS — Z89511 Acquired absence of right leg below knee: Secondary | ICD-10-CM | POA: Diagnosis not present

## 2019-06-19 DIAGNOSIS — S98921A Partial traumatic amputation of right foot, level unspecified, initial encounter: Secondary | ICD-10-CM | POA: Diagnosis not present

## 2019-06-19 DIAGNOSIS — Z89511 Acquired absence of right leg below knee: Secondary | ICD-10-CM | POA: Diagnosis not present

## 2019-06-20 DIAGNOSIS — S98921A Partial traumatic amputation of right foot, level unspecified, initial encounter: Secondary | ICD-10-CM | POA: Diagnosis not present

## 2019-06-20 DIAGNOSIS — Z89511 Acquired absence of right leg below knee: Secondary | ICD-10-CM | POA: Diagnosis not present

## 2019-06-22 DIAGNOSIS — Z89511 Acquired absence of right leg below knee: Secondary | ICD-10-CM | POA: Diagnosis not present

## 2019-06-22 DIAGNOSIS — S98921A Partial traumatic amputation of right foot, level unspecified, initial encounter: Secondary | ICD-10-CM | POA: Diagnosis not present

## 2019-06-26 DIAGNOSIS — Z89511 Acquired absence of right leg below knee: Secondary | ICD-10-CM | POA: Diagnosis not present

## 2019-06-26 DIAGNOSIS — S98921A Partial traumatic amputation of right foot, level unspecified, initial encounter: Secondary | ICD-10-CM | POA: Diagnosis not present

## 2019-06-26 DIAGNOSIS — T8789 Other complications of amputation stump: Secondary | ICD-10-CM | POA: Diagnosis not present

## 2019-07-01 DIAGNOSIS — R69 Illness, unspecified: Secondary | ICD-10-CM | POA: Diagnosis not present

## 2019-07-02 ENCOUNTER — Other Ambulatory Visit: Payer: Self-pay | Admitting: Oncology

## 2019-07-03 ENCOUNTER — Other Ambulatory Visit: Payer: Self-pay

## 2019-07-03 ENCOUNTER — Ambulatory Visit (INDEPENDENT_AMBULATORY_CARE_PROVIDER_SITE_OTHER): Payer: Medicare HMO | Admitting: Family Medicine

## 2019-07-03 ENCOUNTER — Encounter: Payer: Self-pay | Admitting: Family Medicine

## 2019-07-03 VITALS — BP 117/79 | HR 93 | Temp 99.1°F

## 2019-07-03 DIAGNOSIS — Z0000 Encounter for general adult medical examination without abnormal findings: Secondary | ICD-10-CM | POA: Diagnosis not present

## 2019-07-03 DIAGNOSIS — D500 Iron deficiency anemia secondary to blood loss (chronic): Secondary | ICD-10-CM

## 2019-07-03 DIAGNOSIS — I70209 Unspecified atherosclerosis of native arteries of extremities, unspecified extremity: Secondary | ICD-10-CM | POA: Diagnosis not present

## 2019-07-03 DIAGNOSIS — I739 Peripheral vascular disease, unspecified: Secondary | ICD-10-CM

## 2019-07-03 DIAGNOSIS — I129 Hypertensive chronic kidney disease with stage 1 through stage 4 chronic kidney disease, or unspecified chronic kidney disease: Secondary | ICD-10-CM

## 2019-07-03 DIAGNOSIS — Z89511 Acquired absence of right leg below knee: Secondary | ICD-10-CM | POA: Diagnosis not present

## 2019-07-03 DIAGNOSIS — I480 Paroxysmal atrial fibrillation: Secondary | ICD-10-CM | POA: Diagnosis not present

## 2019-07-03 DIAGNOSIS — S41101A Unspecified open wound of right upper arm, initial encounter: Secondary | ICD-10-CM

## 2019-07-03 DIAGNOSIS — E559 Vitamin D deficiency, unspecified: Secondary | ICD-10-CM | POA: Diagnosis not present

## 2019-07-03 DIAGNOSIS — E1151 Type 2 diabetes mellitus with diabetic peripheral angiopathy without gangrene: Secondary | ICD-10-CM

## 2019-07-03 DIAGNOSIS — Z23 Encounter for immunization: Secondary | ICD-10-CM | POA: Diagnosis not present

## 2019-07-03 DIAGNOSIS — Z125 Encounter for screening for malignant neoplasm of prostate: Secondary | ICD-10-CM

## 2019-07-03 DIAGNOSIS — G8191 Hemiplegia, unspecified affecting right dominant side: Secondary | ICD-10-CM

## 2019-07-03 DIAGNOSIS — E1142 Type 2 diabetes mellitus with diabetic polyneuropathy: Secondary | ICD-10-CM

## 2019-07-03 DIAGNOSIS — E782 Mixed hyperlipidemia: Secondary | ICD-10-CM

## 2019-07-03 LAB — UA/M W/RFLX CULTURE, ROUTINE
Bilirubin, UA: NEGATIVE
Ketones, UA: NEGATIVE
Leukocytes,UA: NEGATIVE
Nitrite, UA: NEGATIVE
Protein,UA: NEGATIVE
RBC, UA: NEGATIVE
Specific Gravity, UA: 1.02 (ref 1.005–1.030)
Urobilinogen, Ur: 1 mg/dL (ref 0.2–1.0)
pH, UA: 6 (ref 5.0–7.5)

## 2019-07-03 LAB — MICROALBUMIN, URINE WAIVED
Creatinine, Urine Waived: 50 mg/dL (ref 10–300)
Microalb, Ur Waived: 10 mg/L (ref 0–19)
Microalb/Creat Ratio: <30 mg/g (ref <30)

## 2019-07-03 LAB — BAYER DCA HB A1C WAIVED: HB A1C (BAYER DCA - WAIVED): 6.8 % (ref <7.0)

## 2019-07-03 MED ORDER — APIXABAN 5 MG PO TABS: 5.0000 mg | ORAL_TABLET | Freq: Two times a day (BID) | ORAL | 1 refills | Status: DC

## 2019-07-03 MED ORDER — GABAPENTIN 800 MG PO TABS: 800.0000 mg | ORAL_TABLET | Freq: Three times a day (TID) | ORAL | 1 refills | Status: DC

## 2019-07-03 MED ORDER — HYDROCHLOROTHIAZIDE 12.5 MG PO CAPS: 12.5000 mg | ORAL_CAPSULE | Freq: Every day | ORAL | 1 refills | Status: DC

## 2019-07-03 MED ORDER — EMPAGLIFLOZIN 10 MG PO TABS: 5.0000 mg | ORAL_TABLET | Freq: Every day | ORAL | 1 refills | Status: DC

## 2019-07-03 NOTE — Patient Instructions (Signed)

## 2019-07-03 NOTE — Assessment & Plan Note (Signed)
Under good control on current regimen. Continue current regimen. Continue to monitor. Call with any concerns. Refills given. Labs drawn today.   

## 2019-07-03 NOTE — Progress Notes (Signed)
BP 117/79    Pulse 93    Temp 99.1 F (37.3 C)    SpO2 98%    Subjective:    Patient ID: Glenn Gerold., male    DOB: [birth year], 63 y.o.   MRN: [id number]  HPI: Glenn Elkhatib. is a 63 y.o. male presenting on 07/03/2019 for comprehensive medical examination. Current medical complaints include:  DIABETES Hypoglycemic episodes:no Polydipsia/polyuria: no Visual disturbance: no Chest pain: no Paresthesias: no Glucose Monitoring: no  Accucheck frequency: Not Checking Taking Insulin?: no Blood Pressure Monitoring: not checking Retinal Examination: Up to Date Foot Exam: Up to Date Diabetic Education: Completed Pneumovax: Up to Date Influenza: Up to Date Aspirin: no  HYPERTENSION / [place] Satisfied with current treatment? yes Duration of hypertension: chronic BP monitoring frequency: not checking BP medication side effects: no Past BP meds: HCTZ, metoprolol Duration of hyperlipidemia: chronic Cholesterol medication side effects: no Cholesterol supplements: none Past cholesterol medications: vytorin Medication compliance: excellent compliance Aspirin: yes Recent stressors: no Recurrent headaches: no Visual changes: no Palpitations: no Dyspnea: no Chest pain: no Lower extremity edema: no Dizzy/lightheaded: no  ANEMIA Anemia status: controlled Etiology of anemia: Duration of anemia treatment:  Compliance with treatment: excellent compliance Iron supplementation side effects: no Severity of anemia: severe Fatigue: yes Decreased exercise tolerance: no  Dyspnea on exertion: no Palpitations: no Bleeding: no Pica: no  He currently lives with: wife Interim Problems from his last visit: no  Functional Status Survey:    FALL RISK: Fall Risk  12/27/2018 09/15/2018 08/10/2018 07/20/2018 07/12/2018  Falls in the past year? 0 1 0 0 1  Comment - - - - -  Number falls in past yr: - 0 - - 0  Injury with Fall? - 0 - - 0  Comment - went to emergency room   - - -  Risk for fall due to : - Impaired balance/gait;Impaired mobility - - -  Follow up Falls evaluation completed Falls evaluation completed;Falls prevention discussed;Education provided - - Falls evaluation completed    Depression Screen Depression screen [facility] 2/9 09/15/2018 08/10/2018 07/11/2018 08/20/2017 05/20/2017  Decreased Interest 0 0 0 0 0  Down, Depressed, Hopeless 0 0 0 0 0  PHQ - 2 Score 0 0 0 0 0  Some recent data might be hidden    Advanced Directives <no information>  Past Medical History:  Past Medical History:  Diagnosis Date   Acute left 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 04/2018 Echo: 4.1cm. Asc Ao 3.5cm.   Dysrhythmia    a fib   Gangrene of right foot ([place]) 06/02/2018   GERD (gastroesophageal reflux disease)    History of echocardiogram    a. 04/2018 Echo: Ef 60-65%, no rwma, midly to mod dil Ao root - 4.1cm. Asc Ao 3.5cm. Mild MR. Nl RV fxn. Nl PASP.   History of hernia repair    History of stress test    a. 2016 MV ([name]): EF 58%, no ischemia.   Hyperlipidemia    Hypertension    Leg pain    Legally blind    Necrotic toes ([place]) 02/21/2018   PAD (peripheral artery disease) ([name])    PAF (paroxysmal atrial fibrillation) ([place])    a.  on Eliquis as of 2018; b. CHADS2VASc => 5 (HTN, DM, stroke x 2, vascular disease)   Peripheral vascular disease ([place])    a. followed by Dr. Lucky Cowboy; b. s/p kissing balloon stents and right external iliac stent in 10/2017; c. LE angiogrpahy 01/2018: No significant arterial occlusive disease in the lower extremities.   Pulmonary nodules    Stroke [facility])    a. 2016 & 2018   Stroke ([place]) 07/11/2018    Surgical History:  Past Surgical History:  Procedure Laterality Date   AMPUTATION Right  06/02/2018   Procedure: AMPUTATION BELOW KNEE;  Surgeon: [name], MD;  Location: [facility] ORS;  Service: Vascular;  Laterality: Right;   APPENDECTOMY     COLONOSCOPY WITH PROPOFOL N/A 05/12/2018   Procedure: COLONOSCOPY WITH PROPOFOL;  Surgeon: [name], MD;  Location: [facility] ENDOSCOPY;  Service: Gastroenterology;  Laterality: N/A;   ESOPHAGOGASTRODUODENOSCOPY (EGD) WITH PROPOFOL N/A 05/12/2018   Procedure: ESOPHAGOGASTRODUODENOSCOPY (EGD) WITH PROPOFOL;  Surgeon: [name], MD;  Location: [facility] ENDOSCOPY;  Service: Gastroenterology;  Laterality: N/A;   GIVENS CAPSULE STUDY N/A 07/13/2018   Procedure: GIVENS CAPSULE STUDY;  Surgeon: [name], MD;  Location: [facility] ENDOSCOPY;  Service: Gastroenterology;  Laterality: N/A;   HERNIA REPAIR     UMBILICAL   LOWER EXTREMITY ANGIOGRAPHY Right 10/25/2017   Procedure: LOWER EXTREMITY ANGIOGRAPHY;  Surgeon: [name], MD;  Location: [place] CV LAB;  Service: Cardiovascular;  Laterality: Right;   LOWER EXTREMITY ANGIOGRAPHY Right 01/13/2018   Procedure: LOWER EXTREMITY ANGIOGRAPHY;  Surgeon: [name], MD;  Location: [place] CV LAB;  Service: Cardiovascular;  Laterality: Right;   LOWER EXTREMITY INTERVENTION  10/25/2017   Procedure: LOWER EXTREMITY INTERVENTION;  Surgeon: [name], MD;  Location: [place] CV LAB;  Service: Cardiovascular;;   SKIN SPLIT GRAFT Right 12/28/2018   Procedure: SKIN GRAFT SPLIT THICKNESS ( SYNTHETIC );  Surgeon: [name], MD;  Location: [facility] ORS;  Service: Vascular;  Laterality: Right;   TONSILLECTOMY     WOUND DEBRIDEMENT Right 08/08/2018   Procedure: DEBRIDEMENT WOUND WITH WOUND VAC APPLICATION;  Surgeon: [name], MD;  Location: [facility] ORS;  Service: Vascular;  Laterality: Right;    Medications:  Current Outpatient Medications on File Prior to Visit  Medication Sig   acetaminophen (TYLENOL) 500 MG tablet Take 1,000 mg by mouth every 6 (six) hours as needed (for pain.).    aspirin 81 MG EC tablet TAKE 1 TABLET BY MOUTH EVERY DAY   Calcium Carb-Cholecalciferol (CALCIUM 600-D PO) Take by mouth daily.   cholecalciferol (VITAMIN D3) 25 MCG (1000 UT) tablet Take 5,000 Units by mouth daily.    docusate sodium (COLACE) 100 MG capsule Take 1 capsule (100 mg total) by mouth daily. Do not take if you have loose stools   donepezil (ARICEPT) 5 MG tablet Take 5 mg by mouth at bedtime.   ezetimibe-simvastatin (VYTORIN) 10-40 MG tablet Take 1 tablet by mouth daily at 6 PM.   ferrous sulfate (FERROUSUL) 325 (65 FE) MG tablet Take 1 tablet (325 mg total) by mouth 2 (two) times daily with a meal.   lansoprazole (PREVACID) 30 MG capsule Take 30 mg by mouth daily at 12 noon.   metFORMIN (GLUCOPHAGE) 1000 MG tablet Take 1 tablet (1,000 mg total) by mouth 2 (two) times daily with a meal.   metoprolol tartrate (LOPRESSOR) 25 MG tablet Take 1 tablet (25 mg total) by mouth 2 (two) times daily.   Multiple Vitamin (MULTIVITAMIN WITH MINERALS)  TABS tablet Take 1 tablet by mouth daily.   ONE TOUCH ULTRA TEST test strip USE TO TEST BLOOD SUGAR BID   ONETOUCH DELICA LANCETS 19J MISC USE AS DIRECTED TO TEST BLOOD SUGAR BID   vitamin C (ASCORBIC ACID) 500 MG tablet Take 500 mg by mouth daily.   No current facility-administered medications on file prior to visit.     Allergies:  Allergies  Allergen Reactions   Sodium Pentobarbital [Pentobarbital] Shortness Of Breath   Lipitor [Atorvastatin] Rash    Social History:  Social History   Socioeconomic History   Marital status: Married    Spouse name: Not on file   Number of children: 0   Years of education: Not on file   Highest education level: Associate degree: academic program  Occupational History   Not on file  Social Needs   Financial resource strain: Somewhat hard   Food insecurity    Worry: Never true    Inability: Never true   Transportation needs    Medical: No    Non-medical: No  Tobacco Use    Smoking status: Former Smoker    Packs/day: 1.00    Years: 30.00    Pack years: 30.00    Types: Cigarettes    Quit date: 01/09/2018    Years since quitting: 1.4   Smokeless tobacco: Never Used  Substance and Sexual Activity   Alcohol use: Never    Alcohol/week: 0.0 standard drinks    Frequency: Never    Comment: occasionally beer    Drug use: No   Sexual activity: Yes  Lifestyle   Physical activity    Days per week: 0 days    Minutes per session: 0 min   Stress: Not at all  Relationships   Social connections    Talks on phone: More than three times a week    Gets together: Never    Attends religious service: Never    Active member of club or organization: No    Attends meetings of clubs or organizations: Never    Relationship status: Married   Intimate partner violence    Fear of current or ex partner: No    Emotionally abused: No    Physically abused: No    Forced sexual activity: No  Other Topics Concern   Not on file  Social History Narrative   Not on file   Social History   Tobacco Use  Smoking Status Former Smoker   Packs/day: 1.00   Years: 30.00   Pack years: 30.00   Types: Cigarettes   Quit date: 01/09/2018   Years since quitting: 1.4  Smokeless Tobacco Never Used   Social History   Substance and Sexual Activity  Alcohol Use Never   Alcohol/week: 0.0 standard drinks   Frequency: Never   Comment: occasionally beer     Family History:  Family History  Problem Relation Age of Onset   Diabetes Father    Hypertension Father    Hyperlipidemia Father     Past medical history, surgical history, medications, allergies, family history and social history reviewed with patient today and changes made to appropriate areas of the chart.   Review of Systems  Constitutional: Negative.   HENT: Negative.   Eyes: Negative.   Respiratory: Negative.   Cardiovascular: Positive for leg swelling. Negative for chest pain, palpitations,  orthopnea, claudication and PND.  Gastrointestinal: Negative.   Genitourinary: Negative.   Musculoskeletal: Negative.   Skin: Negative.   Neurological: Negative.   Endo/Heme/Allergies:  Negative.   Psychiatric/Behavioral: Negative.     All other ROS negative except what is listed above and in the HPI.      Objective:    BP 117/79    Pulse 93    Temp 99.1 F (37.3 C)    SpO2 98%   Wt Readings from Last 3 Encounters:  06/02/19 188 lb (85.3 kg)  03/14/19 193 lb (87.5 kg)  12/23/18 180 lb (81.6 kg)    Physical Exam Vitals signs and nursing note reviewed.  Constitutional:      General: He is not in acute distress.    Appearance: Normal appearance. He is obese. He is not ill-appearing, toxic-appearing or diaphoretic.  HENT:     Head: Normocephalic and atraumatic.     Right Ear: Tympanic membrane, ear canal and external ear normal. There is no impacted cerumen.     Left Ear: Tympanic membrane, ear canal and external ear normal. There is no impacted cerumen.     Nose: Nose normal. No congestion or rhinorrhea.     Mouth/Throat:     Mouth: Mucous membranes are moist.     Pharynx: Oropharynx is clear. No oropharyngeal exudate or posterior oropharyngeal erythema.  Eyes:     General: No scleral icterus.       Right eye: No discharge.        Left eye: No discharge.     Extraocular Movements: Extraocular movements intact.     Conjunctiva/sclera: Conjunctivae normal.     Pupils: Pupils are equal, round, and reactive to light.  Neck:     Musculoskeletal: Normal range of motion and neck supple. No neck rigidity or muscular tenderness.     Vascular: No carotid bruit.  Cardiovascular:     Rate and Rhythm: Normal rate and regular rhythm.     Pulses: Normal pulses.     Heart sounds: No murmur. No friction rub. No gallop.   Pulmonary:     Effort: Pulmonary effort is normal. No respiratory distress.     Breath sounds: Normal breath sounds. No stridor. No wheezing, rhonchi or rales.  Chest:      Chest wall: No tenderness.  Abdominal:     General: Abdomen is flat. Bowel sounds are normal. There is no distension.     Palpations: Abdomen is soft. There is no mass.     Tenderness: There is no abdominal tenderness. There is no right CVA tenderness, left CVA tenderness, guarding or rebound.     Hernia: No hernia is present.  Genitourinary:    Comments: Genital exam deferred with shared decision making Musculoskeletal:        General: No swelling, tenderness, deformity or signs of injury.     Left lower leg: Edema present.     Comments: BKA on the R with ace bandage on it  Lymphadenopathy:     Cervical: No cervical adenopathy.  Skin:    General: Skin is warm and dry.     Capillary Refill: Capillary refill takes less than 2 seconds.     Coloration: Skin is not jaundiced or pale.     Findings: No bruising, erythema, lesion or rash.     Comments: Open wound on R arm, consistent with bug bite. Healing well.   Neurological:     General: No focal deficit present.     Mental Status: He is alert and oriented to person, place, and time.     Cranial Nerves: No cranial nerve deficit.     Sensory: No  sensory deficit.     Motor: No weakness.     Coordination: Coordination normal.     Gait: Gait normal.     Deep Tendon Reflexes: Reflexes normal.  Psychiatric:        Mood and Affect: Mood normal.        Behavior: Behavior normal.        Thought Content: Thought content normal.        Judgment: Judgment normal.     6CIT Screen 07/03/2019 09/15/2018 08/20/2017  What Year? 4 points 0 points 0 points  What month? 0 points 0 points 0 points  What time? 0 points 0 points 3 points  Count back from 20 0 points 0 points 0 points  Months in reverse 4 points 0 points 4 points  Repeat phrase 8 points 2 points 4 points  Total Score _0 Results for orders placed or performed in visit on 07/03/19  Bayer DCA Hb A1c Waived  Result Value Ref Range   HB A1C (BAYER DCA - WAIVED) 6.8 <7.0 %   Microalbumin, Urine Waived  Result Value Ref Range   Microalb, Ur Waived 10 0 - 19 mg/L   Creatinine, Urine Waived 50 10 - 300 mg/dL   Microalb/Creat Ratio <30 <30 mg/g  UA/M w/rflx Culture, Routine   Specimen: Urine   URINE  Result Value Ref Range   Specific Gravity, UA 1.020 1.005 - 1.030   pH, UA 6.0 5.0 - 7.5   Color, UA Yellow Yellow   Appearance Ur Clear Clear   Leukocytes,UA Negative Negative   Protein,UA Negative Negative/Trace   Glucose, UA 3+ (A) Negative   Ketones, UA Negative Negative   RBC, UA Negative Negative   Bilirubin, UA Negative Negative   Urobilinogen, Ur 1.0 0.2 - 1.0 mg/dL   Nitrite, UA Negative Negative      Assessment & Plan:   Problem List Items Addressed This Visit      Cardiovascular and Mediastinum   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 Continue to monitor. Call with any concerns. Continue to monitor.       Relevant Medications   hydrochlorothiazide (MICROZIDE) 12.5 MG capsule   empagliflozin (JARDIANCE) 10 MG TABS tablet   apixaban (ELIQUIS) 5 MG TABS tablet   Other Relevant Orders   Bayer DCA Hb A1c Waived (Completed)   CBC with Differential OUT   Comp Met (CMET)   Microalbumin, Urine Waived (Completed)   TSH   UA/M w/rflx Culture, Routine (Completed)   PVD (peripheral vascular disease) ([name])    Continue to follow with vascular. Call with any concerns. Continue to monitor.       Relevant Medications   hydrochlorothiazide (MICROZIDE) 12.5 MG capsule   apixaban (ELIQUIS) 5 MG TABS tablet   Other Relevant Orders   CBC with Differential OUT   Comp Met (CMET)   TSH   UA/M w/rflx Culture, Routine (Completed)   A-fib ([name])    Stable. HR under good control. On eliquis. Continue Current regimen. Continue to monitor. Call with any concerns.      Relevant Medications   hydrochlorothiazide (MICROZIDE) 12.5 MG capsule   apixaban (ELIQUIS) 5 MG TABS  tablet   Other Relevant Orders   CBC with Differential OUT   Comp Met (CMET)   TSH   UA/M w/rflx Culture, Routine (Completed)     Endocrine   Diabetic peripheral  neuropathy ([place])    Stable with an A1c of 6.8- continue current regimen. Continue to monitor. Call with any concerns. Continue to monitor.       Relevant Medications   gabapentin (NEURONTIN) 800 MG tablet   empagliflozin (JARDIANCE) 10 MG TABS tablet     Nervous and Auditory   Acute right hemiparesis ([name])    Stable. Will keep BP, sugars and cholesterol under good control. Continue to monitor.         Genitourinary   Benign hypertensive renal disease    Under good control on current regimen. Continue current regimen. Continue to monitor. Call with any concerns. Refills given. Labs drawn today.        Relevant Orders   CBC with Differential OUT   Comp Met (CMET)   Microalbumin, Urine Waived (Completed)   TSH   UA/M w/rflx Culture, Routine (Completed)     Other   Hyperlipidemia    Under good control on current regimen. Continue current regimen. Continue to monitor. Call with any concerns. Refills given. Labs drawn today.       Relevant Medications   hydrochlorothiazide (MICROZIDE) 12.5 MG capsule   apixaban (ELIQUIS) 5 MG TABS tablet   Other Relevant Orders   CBC with Differential OUT   Comp Met (CMET)   Lipid Panel w/o Chol/HDL Ratio OUT   TSH   UA/M w/rflx Culture, Routine (Completed)   Iron deficiency anemia    Rechecking labs today. Await results.       Relevant Orders   CBC with Differential OUT   Comp Met (CMET)   TSH   UA/M w/rflx Culture, Routine (Completed)   Iron Binding Cap (TIBC)   Ferritin   Hx of BKA, right ([place])    Doing well. Continue to follow with vascular. Call with any concerns.       Vitamin D insufficiency    Rechecking labs today. Await results.       Relevant Orders   CBC with Differential OUT   Comp Met (CMET)   TSH   UA/M w/rflx Culture, Routine (Completed)    Vit D  25 hydroxy (rtn osteoporosis monitoring)    Other Visit Diagnoses    Routine general medical examination at a health care facility    -  Primary   Vaccines up to date. Screening labs checked today. Colonoscopy up to date. Call with any concerns. Continue diet and exercise.    Screening for prostate cancer       Labs drawn today. Await results.    Relevant Orders   PSA   Immunization due       Flu shot given today.   Relevant Orders   Flu Vaccine QUAD 6+ mos PF IM (Fluarix Quad PF) (Completed)   Open wound of right upper arm, initial encounter       Healing well. Due for Td- given today.   Relevant Orders   Td : Tetanus/diphtheria >7yo Preservative  free (Completed)       Preventative Services:  Health Risk Assessment and Personalized Prevention Plan: Done today Bone Mass Measurements: N/A CVD Screening: Done today Colon Cancer Screening: up to date Depression Screening: done today Diabetes Screening: done today Glaucoma Screening: see your eye doctor Hepatitis B vaccine: N/A Hepatitis C screening: Up to date HIV Screening: Up to date Flu Vaccine: Done today Lung cancer Screening: Up to date Obesity Screening: Done today Pneumonia Vaccines (2): Up to date STI Screening: N/A PSA screening: Done today  Discussed aspirin  prophylaxis for myocardial infarction prevention and decision was made to continue ASA  LABORATORY TESTING:  Health maintenance labs ordered today as discussed above.   The natural history of prostate cancer and ongoing controversy regarding screening and potential treatment outcomes of prostate cancer has been discussed with the patient. The meaning of a false positive PSA and a false negative PSA has been discussed. He indicates understanding of the limitations of this screening test and wishes to proceed with screening PSA testing.   IMMUNIZATIONS:   - Tdap: Tetanus vaccination status reviewed: Td vaccination indicated and given today. -  Influenza: Administered today - Pneumovax: Up to date - Prevnar: Not applicable  SCREENING: - Colonoscopy: Up to date  Discussed with patient purpose of the colonoscopy is to detect colon cancer at curable precancerous or early stages   PATIENT COUNSELING:    Sexuality: Discussed sexually transmitted diseases, partner selection, use of condoms, avoidance of unintended pregnancy  and contraceptive alternatives.   Advised to avoid cigarette smoking.  I discussed with the patient that most people either abstain from alcohol or drink within safe limits (<=14/week and <=4 drinks/occasion for males, <=7/weeks and <= 3 drinks/occasion for females) and that the risk for alcohol disorders and other health effects rises proportionally with the number of drinks per week and how often a drinker exceeds daily limits.  Discussed cessation/primary prevention of drug use and availability of treatment for abuse.   Diet: Encouraged to adjust caloric intake to maintain  or achieve ideal body weight, to reduce intake of dietary saturated fat and total fat, to limit sodium intake by avoiding high sodium foods and not adding table salt, and to maintain adequate dietary potassium and calcium preferably from fresh fruits, vegetables, and low-fat dairy products.    stressed the importance of regular exercise  Injury prevention: Discussed safety belts, safety helmets, smoke detector, smoking near bedding or upholstery.   Dental health: Discussed importance of regular tooth brushing, flossing, and dental visits.   Follow up plan: NEXT PREVENTATIVE PHYSICAL DUE IN 1 YEAR. Return in about 3 months (around 10/01/2019).

## 2019-07-03 NOTE — Assessment & Plan Note (Signed)
Stable. Will keep BP, sugars and cholesterol under good control. Continue to monitor.

## 2019-07-03 NOTE — Assessment & Plan Note (Signed)
Stable. HR under good control. On eliquis. Continue Current regimen. Continue to monitor. Call with any concerns.

## 2019-07-03 NOTE — Assessment & Plan Note (Signed)
Rechecking labs today. Await results.  

## 2019-07-03 NOTE — Assessment & Plan Note (Addendum)
Doing well. Continue to follow with vascular. Call with any concerns.

## 2019-07-03 NOTE — Assessment & Plan Note (Signed)
Continue to follow with vascular. Call with any concerns. Continue to monitor.

## 2019-07-03 NOTE — Assessment & Plan Note (Signed)
Stable with an A1c of 6.8- continue current regimen. Continue to monitor. Call with any concerns. Continue to monitor.

## 2019-07-04 LAB — LIPID PANEL W/O CHOL/HDL RATIO
Cholesterol, Total: 110 mg/dL (ref 100–199)
HDL: 47 mg/dL (ref >39)
LDL Chol Calc (NIH): 44 mg/dL (ref 0–99)
Triglycerides: 99 mg/dL (ref 0–149)
VLDL Cholesterol Cal: 19 mg/dL (ref 5–40)

## 2019-07-04 LAB — COMPREHENSIVE METABOLIC PANEL
ALT: 20 IU/L (ref 0–44)
AST: 21 IU/L (ref 0–40)
Albumin/Globulin Ratio: 2 (ref 1.2–2.2)
Albumin: 4.7 g/dL (ref 3.8–4.8)
Alkaline Phosphatase: 90 IU/L (ref 39–117)
BUN/Creatinine Ratio: 30 — ABNORMAL HIGH (ref 10–24)
BUN: 19 mg/dL (ref 8–27)
Bilirubin Total: 0.5 mg/dL (ref 0.0–1.2)
CO2: 24 mmol/L (ref 20–29)
Calcium: 10 mg/dL (ref 8.6–10.2)
Chloride: 101 mmol/L (ref 96–106)
Creatinine, Ser: 0.64 mg/dL — ABNORMAL LOW (ref 0.76–1.27)
GFR calc Af Amer: 120 mL/min/{1.73_m2} (ref >59)
GFR calc non Af Amer: 103 mL/min/{1.73_m2} (ref >59)
Globulin, Total: 2.4 g/dL (ref 1.5–4.5)
Glucose: 138 mg/dL — ABNORMAL HIGH (ref 65–99)
Potassium: 4.1 mmol/L (ref 3.5–5.2)
Sodium: 142 mmol/L (ref 134–144)
Total Protein: 7.1 g/dL (ref 6.0–8.5)

## 2019-07-04 LAB — CBC WITH DIFFERENTIAL/PLATELET
Basophils Absolute: 0 10*3/uL (ref 0.0–0.2)
Basos: 1 %
EOS (ABSOLUTE): 0.3 10*3/uL (ref 0.0–0.4)
Eos: 4 %
Hematocrit: 42.5 % (ref 37.5–51.0)
Hemoglobin: 13.4 g/dL (ref 13.0–17.7)
Immature Grans (Abs): 0 10*3/uL (ref 0.0–0.1)
Immature Granulocytes: 0 %
Lymphocytes Absolute: 1.2 10*3/uL (ref 0.7–3.1)
Lymphs: 16 %
MCH: 27.7 pg (ref 26.6–33.0)
MCHC: 31.5 g/dL (ref 31.5–35.7)
MCV: 88 fL (ref 79–97)
Monocytes Absolute: 0.5 10*3/uL (ref 0.1–0.9)
Monocytes: 6 %
Neutrophils Absolute: 5.4 10*3/uL (ref 1.4–7.0)
Neutrophils: 73 %
Platelets: 225 10*3/uL (ref 150–450)
RBC: 4.84 x10E6/uL (ref 4.14–5.80)
RDW: 12.6 % (ref 11.6–15.4)
WBC: 7.4 10*3/uL (ref 3.4–10.8)

## 2019-07-04 LAB — VITAMIN D 25 HYDROXY (VIT D DEFICIENCY, FRACTURES): Vit D, 25-Hydroxy: 30.1 ng/mL (ref 30.0–100.0)

## 2019-07-04 LAB — IRON AND TIBC
Iron Saturation: 16 % (ref 15–55)
Iron: 59 ug/dL (ref 38–169)
Total Iron Binding Capacity: 378 ug/dL (ref 250–450)
UIBC: 319 ug/dL (ref 111–343)

## 2019-07-04 LAB — TSH: TSH: 2.17 u[IU]/mL (ref 0.450–4.500)

## 2019-07-04 LAB — PSA: Prostate Specific Ag, Serum: 0.5 ng/mL (ref 0.0–4.0)

## 2019-07-04 LAB — FERRITIN: Ferritin: 42 ng/mL (ref 30–400)

## 2019-07-12 ENCOUNTER — Inpatient Hospital Stay: Payer: Medicare HMO | Attending: Oncology

## 2019-07-12 ENCOUNTER — Other Ambulatory Visit: Payer: Self-pay

## 2019-07-12 DIAGNOSIS — D509 Iron deficiency anemia, unspecified: Secondary | ICD-10-CM | POA: Insufficient documentation

## 2019-07-12 DIAGNOSIS — T8789 Other complications of amputation stump: Secondary | ICD-10-CM | POA: Diagnosis not present

## 2019-07-12 DIAGNOSIS — D500 Iron deficiency anemia secondary to blood loss (chronic): Secondary | ICD-10-CM

## 2019-07-12 DIAGNOSIS — Z89511 Acquired absence of right leg below knee: Secondary | ICD-10-CM | POA: Diagnosis not present

## 2019-07-12 DIAGNOSIS — S98921A Partial traumatic amputation of right foot, level unspecified, initial encounter: Secondary | ICD-10-CM | POA: Diagnosis not present

## 2019-07-12 LAB — CBC WITH DIFFERENTIAL/PLATELET
Abs Immature Granulocytes: 0.03 10*3/uL (ref 0.00–0.07)
Basophils Absolute: 0 10*3/uL (ref 0.0–0.1)
Basophils Relative: 1 %
Eosinophils Absolute: 0.3 10*3/uL (ref 0.0–0.5)
Eosinophils Relative: 4 %
HCT: 40.9 % (ref 39.0–52.0)
Hemoglobin: 12.7 g/dL — ABNORMAL LOW (ref 13.0–17.0)
Immature Granulocytes: 0 %
Lymphocytes Relative: 17 %
Lymphs Abs: 1.5 10*3/uL (ref 0.7–4.0)
MCH: 27.5 pg (ref 26.0–34.0)
MCHC: 31.1 g/dL (ref 30.0–36.0)
MCV: 88.5 fL (ref 80.0–100.0)
Monocytes Absolute: 0.6 10*3/uL (ref 0.1–1.0)
Monocytes Relative: 7 %
Neutro Abs: 6.1 10*3/uL (ref 1.7–7.7)
Neutrophils Relative %: 71 %
Platelets: 187 10*3/uL (ref 150–400)
RBC: 4.62 MIL/uL (ref 4.22–5.81)
RDW: 13 % (ref 11.5–15.5)
WBC: 8.5 10*3/uL (ref 4.0–10.5)
nRBC: 0 % (ref 0.0–0.2)

## 2019-07-12 LAB — IRON AND TIBC
Iron: 80 ug/dL (ref 45–182)
Saturation Ratios: 19 % (ref 17.9–39.5)
TIBC: 430 ug/dL (ref 250–450)
UIBC: 350 ug/dL

## 2019-07-12 LAB — FERRITIN: Ferritin: 29 ng/mL (ref 24–336)

## 2019-07-14 ENCOUNTER — Inpatient Hospital Stay (HOSPITAL_BASED_OUTPATIENT_CLINIC_OR_DEPARTMENT_OTHER): Payer: Medicare HMO | Admitting: Oncology

## 2019-07-14 ENCOUNTER — Inpatient Hospital Stay: Payer: Medicare HMO

## 2019-07-14 ENCOUNTER — Encounter: Payer: Self-pay | Admitting: Oncology

## 2019-07-14 DIAGNOSIS — D509 Iron deficiency anemia, unspecified: Secondary | ICD-10-CM | POA: Diagnosis not present

## 2019-07-14 DIAGNOSIS — K5520 Angiodysplasia of colon without hemorrhage: Secondary | ICD-10-CM | POA: Diagnosis not present

## 2019-07-14 DIAGNOSIS — Z7901 Long term (current) use of anticoagulants: Secondary | ICD-10-CM

## 2019-07-14 NOTE — Progress Notes (Signed)
Patient verified using two identifiers for virtual visit via telephone today.  Patient does not offer any problems today.  

## 2019-07-15 NOTE — Progress Notes (Signed)
HEMATOLOGY-ONCOLOGY TeleHEALTH VISIT PROGRESS NOTE  I connected with [name] on 07/15/19 at 10:15 AM EST by video enabled telemedicine visit and verified that I am speaking with the correct person using two identifiers. I discussed the limitations, risks, security and privacy concerns of performing an evaluation and management service by telemedicine and the availability of in-person appointments. I also discussed with the patient that there may be a patient responsible charge related to this service. The patient expressed understanding and agreed to proceed.   Other persons participating in the visit and their role in the encounter:  None  Patient's location: Home  Provider's location: office Chief Complaint: Iron deficiency anemia   INTERVAL HISTORY [name] [name]. is a 63 y.o. male who has above history reviewed by me today presents for follow up visit for management of iron deficiency anemia Problems and complaints are listed below:  Patient has had Feraheme 510 mg x 1 since last visit.  Patient has been taking oral iron supplementation.  He reports feeling pretty well.  No new complaints. Denies any GI bleeding events. Review of Systems  Constitutional: Negative for appetite change, chills, fatigue, fever and unexpected weight change.  HENT:   Negative for hearing loss and voice change.   Eyes: Negative for eye problems and icterus.  Respiratory: Negative for chest tightness, cough and shortness of breath.   Cardiovascular: Negative for chest pain and leg swelling.  Gastrointestinal: Negative for abdominal distention and abdominal pain.  Endocrine: Negative for hot flashes.  Genitourinary: Negative for difficulty urinating, dysuria and frequency.   Musculoskeletal: Negative for arthralgias.  Skin: Negative for itching and rash.  Neurological: Negative for light-headedness and numbness.  Hematological: Negative for adenopathy. Does not bruise/bleed easily.   Psychiatric/Behavioral: Negative for confusion.    Past Medical History:  Diagnosis Date  . Acute left PCA stroke ([place]) 02/17/2015  . Anemia   . Atherosclerosis of native arteries of the extremities with gangrene ([place]) 05/08/2018  . Atrial fibrillation ([place])   . Blind    right eye  . Diabetes mellitus with complication ([place])   . Dilated aortic root ([place])    a. 04/2018 Echo: 4.1cm. Asc Ao 3.5cm.  Marland Kitchen Dysrhythmia    a fib  . Gangrene of right foot ([place]) 06/02/2018  . GERD (gastroesophageal reflux disease)   . History of echocardiogram    a. 04/2018 Echo: Ef 60-65%, no rwma, midly to mod dil Ao root - 4.1cm. Asc Ao 3.5cm. Mild MR. Nl RV fxn. Nl PASP.  Marland Kitchen History of hernia repair   . History of stress test    a. 2016 MV ([name]): EF 58%, no ischemia.  . Hyperlipidemia   . Hypertension   . Leg pain   . Legally blind   . Necrotic toes ([place]) 02/21/2018  . PAD (peripheral artery disease) ([place])   . PAF (paroxysmal atrial fibrillation) ([name])    a. on Eliquis as of 2018; b. CHADS2VASc => 5 (HTN, DM, stroke x 2, vascular disease)  . Peripheral vascular disease ([place])    a. followed by Dr. Lucky Cowboy; b. s/p kissing balloon stents and right external iliac stent in 10/2017; c. LE angiogrpahy 01/2018: No significant arterial occlusive disease in the lower extremities.  . Pulmonary nodules   . Stroke [facility])    a. 2016 & 2018  . Stroke [facility]) 07/11/2018   Past Surgical History:  Procedure Laterality Date  . AMPUTATION Right 06/02/2018   Procedure: AMPUTATION BELOW KNEE;  Surgeon: Leotis Pain  S, MD;  Location: [facility] ORS;  Service: Vascular;  Laterality: Right;  . APPENDECTOMY    . COLONOSCOPY WITH PROPOFOL N/A 05/12/2018   Procedure: COLONOSCOPY WITH PROPOFOL;  Surgeon: [name], MD;  Location: [facility] ENDOSCOPY;  Service: Gastroenterology;  Laterality: N/A;  . ESOPHAGOGASTRODUODENOSCOPY (EGD) WITH PROPOFOL N/A 05/12/2018   Procedure: ESOPHAGOGASTRODUODENOSCOPY (EGD) WITH PROPOFOL;  Surgeon: [name], MD;   Location: [facility] ENDOSCOPY;  Service: Gastroenterology;  Laterality: N/A;  . GIVENS CAPSULE STUDY N/A 07/13/2018   Procedure: GIVENS CAPSULE STUDY;  Surgeon: [name], MD;  Location: [facility] ENDOSCOPY;  Service: Gastroenterology;  Laterality: N/A;  . HERNIA REPAIR     UMBILICAL  . LOWER EXTREMITY ANGIOGRAPHY Right 10/25/2017   Procedure: LOWER EXTREMITY ANGIOGRAPHY;  Surgeon: [name], MD;  Location: [place] CV LAB;  Service: Cardiovascular;  Laterality: Right;  . LOWER EXTREMITY ANGIOGRAPHY Right 01/13/2018   Procedure: LOWER EXTREMITY ANGIOGRAPHY;  Surgeon: [name], MD;  Location: [place] CV LAB;  Service: Cardiovascular;  Laterality: Right;  . LOWER EXTREMITY INTERVENTION  10/25/2017   Procedure: LOWER EXTREMITY INTERVENTION;  Surgeon: [name], MD;  Location: [place] CV LAB;  Service: Cardiovascular;;  . SKIN SPLIT GRAFT Right 12/28/2018   Procedure: SKIN GRAFT SPLIT THICKNESS ( SYNTHETIC );  Surgeon: [name], MD;  Location: [facility] ORS;  Service: Vascular;  Laterality: Right;  . TONSILLECTOMY    . WOUND DEBRIDEMENT Right 08/08/2018   Procedure: DEBRIDEMENT WOUND WITH WOUND VAC APPLICATION;  Surgeon: [name], MD;  Location: [facility] ORS;  Service: Vascular;  Laterality: Right;    Family History  Problem Relation Age of Onset  . Diabetes Father   . Hypertension Father   . Hyperlipidemia Father     Social History   Socioeconomic History  . Marital status: Married    Spouse name: Not on file  . Number of children: 0  . Years of education: Not on file  . Highest education level: Associate degree: academic program  Occupational History  . Not on file  Tobacco Use  . Smoking status: Former Smoker    Packs/day: 1.00    Years: 30.00    Pack years: 30.00    Types: Cigarettes    Quit date: 01/09/2018    Years since quitting: 1.5  . Smokeless tobacco: Never Used  Substance and Sexual Activity  . Alcohol use: Never    Alcohol/week: 0.0 standard drinks     Comment: occasionally beer   . Drug use: No  . Sexual activity: Yes  Other Topics Concern  . Not on file  Social History Narrative  . Not on file   Social Determinants of Health   Financial Resource Strain: Medium Risk  . Difficulty of Paying Living Expenses: Somewhat hard  Food Insecurity: No Food Insecurity  . Worried About Charity fundraiser in the Last Year: Never true  . Ran Out of Food in the Last Year: Never true  Transportation Needs: No Transportation Needs  . Lack of Transportation (Medical): No  . Lack of Transportation (Non-Medical): No  Physical Activity: Inactive  . Days of Exercise per Week: 0 days  . Minutes of Exercise per Session: 0 min  Stress: No Stress Concern Present  . Feeling of Stress : Not at all  Social Connections: Somewhat Isolated  . Frequency of Communication with Friends and Family: More than three times a week  . Frequency of Social Gatherings with Friends and Family: Never  . Attends Religious Services:  Never  . Active Member of Clubs or Organizations: No  . Attends Club or Organization Meetings: Never  . Marital Status: Married  Human resources officer Violence: Not At Risk  . Fear of Current or Ex-Partner: No  . Emotionally Abused: No  . Physically Abused: No  . Sexually Abused: No    Current Outpatient Medications on File Prior to Visit  Medication Sig Dispense Refill  . acetaminophen (TYLENOL) 500 MG tablet Take 1,000 mg by mouth every 6 (six) hours as needed (for pain.).    Marland Kitchen apixaban (ELIQUIS) 5 MG TABS tablet Take 1 tablet (5 mg total) by mouth 2 (two) times daily. 180 tablet 1  . aspirin 81 MG EC tablet TAKE 1 TABLET BY MOUTH EVERY DAY 90 tablet 4  . Calcium Carb-Cholecalciferol (CALCIUM 600-D PO) Take by mouth daily.    . cholecalciferol (VITAMIN D3) 25 MCG (1000 UT) tablet Take 5,000 Units by mouth daily.     Marland Kitchen docusate sodium (COLACE) 100 MG capsule Take 1 capsule (100 mg total) by mouth daily. Do not take if you have loose stools 90  capsule 2  . donepezil (ARICEPT) 5 MG tablet Take 5 mg by mouth at bedtime.    . empagliflozin (JARDIANCE) 10 MG TABS tablet Take 5 mg by mouth daily. 90 tablet 1  . ezetimibe-simvastatin (VYTORIN) 10-40 MG tablet Take 1 tablet by mouth daily at 6 PM. 90 tablet 2  . gabapentin (NEURONTIN) 800 MG tablet Take 1 tablet (800 mg total) by mouth 3 (three) times daily. 270 tablet 1  . hydrochlorothiazide (MICROZIDE) 12.5 MG capsule Take 1 capsule (12.5 mg total) by mouth daily. 45 capsule 1  . lansoprazole (PREVACID) 30 MG capsule Take 30 mg by mouth daily at 12 noon.    . metFORMIN (GLUCOPHAGE) 1000 MG tablet Take 1 tablet (1,000 mg total) by mouth 2 (two) times daily with a meal. 180 tablet 3  . metoprolol tartrate (LOPRESSOR) 25 MG tablet Take 1 tablet (25 mg total) by mouth 2 (two) times daily. 180 tablet 3  . Multiple Vitamin (MULTIVITAMIN WITH MINERALS) TABS tablet Take 1 tablet by mouth daily. 30 tablet 0  . ONE TOUCH ULTRA TEST test strip USE TO TEST BLOOD SUGAR BID 100 each 12  . ONETOUCH DELICA LANCETS 99991111 MISC USE AS DIRECTED TO TEST BLOOD SUGAR BID 100 each 12  . vitamin C (ASCORBIC ACID) 500 MG tablet Take 500 mg by mouth daily.    . ferrous sulfate (FERROUSUL) 325 (65 FE) MG tablet Take 1 tablet (325 mg total) by mouth 2 (two) times daily with a meal. (Patient not taking: Reported on 07/14/2019) 120 tablet 3   No current facility-administered medications on file prior to visit.    Allergies  Allergen Reactions  . Sodium Pentobarbital [Pentobarbital] Shortness Of Breath  . Lipitor [Atorvastatin] Rash       Observations/Objective: Today's Vitals   07/14/19 0958  PainSc: 0-No pain   There is no height or weight on file to calculate BMI.  Physical Exam  Constitutional: He is oriented to person, place, and time. No distress.  Neurological: He is alert and oriented to person, place, and time.  Psychiatric: Affect normal.    CBC    Component Value Date/Time   WBC 8.5 07/12/2019  1335   RBC 4.62 07/12/2019 1335   HGB 12.7 (L) 07/12/2019 1335   HGB 13.4 07/03/2019 1518   HCT 40.9 07/12/2019 1335   HCT 42.5 07/03/2019 1518   PLT 187 07/12/2019  1335   PLT 225 07/03/2019 1518   MCV 88.5 07/12/2019 1335   MCV 88 07/03/2019 1518   MCH 27.5 07/12/2019 1335   MCHC 31.1 07/12/2019 1335   RDW 13.0 07/12/2019 1335   RDW 12.6 07/03/2019 1518   LYMPHSABS 1.5 07/12/2019 1335   LYMPHSABS 1.2 07/03/2019 1518   MONOABS 0.6 07/12/2019 1335   EOSABS 0.3 07/12/2019 1335   EOSABS 0.3 07/03/2019 1518   BASOSABS 0.0 07/12/2019 1335   BASOSABS 0.0 07/03/2019 1518    CMP     Component Value Date/Time   NA 142 07/03/2019 1518   K 4.1 07/03/2019 1518   CL 101 07/03/2019 1518   CO2 24 07/03/2019 1518   GLUCOSE 138 (H) 07/03/2019 1518   GLUCOSE 131 (H) 12/23/2018 1236   BUN 19 07/03/2019 1518   CREATININE 0.64 (L) 07/03/2019 1518   CALCIUM 10.0 07/03/2019 1518   PROT 7.1 07/03/2019 1518   ALBUMIN 4.7 07/03/2019 1518   AST 21 07/03/2019 1518   AST 26 02/26/2015 0943   ALT 20 07/03/2019 1518   ALT 29 02/26/2015 0943   ALKPHOS 90 07/03/2019 1518   BILITOT 0.5 07/03/2019 1518   GFRNONAA 103 07/03/2019 1518   GFRAA 120 07/03/2019 1518     Assessment and Plan: 1. Iron deficiency anemia, unspecified iron deficiency anemia type   2. AVM (arteriovenous malformation) of small bowel, acquired   3. Chronic anticoagulation     Labs are reviewed and discussed with patient.  Hemoglobin 12.7, stable.  Iron panel showed ferritin 29, iron saturation 19.  TIBC 430.  Recommend patient to continue take oral iron supplementation.  No need for IV iron at this point Continue follow-up with gastroenterology.  Patient was previously offered to have AVM ablation procedure.  Patient defers answers to observe for now. Chronic anticoagulation on apixaban for atrial fibrillation.  Follow-up with primary care provider. Follow Up Instructions: 4 months.   I discussed the assessment and treatment  plan with the patient. The patient was provided an opportunity to ask questions and all were answered. The patient agreed with the plan and demonstrated an understanding of the instructions.  The patient was advised to call back or seek an in-person evaluation if the symptoms worsen or if the condition fails to improve as anticipated.    Earlie Server, MD 07/15/2019 10:16 PM

## 2019-07-17 DIAGNOSIS — E1142 Type 2 diabetes mellitus with diabetic polyneuropathy: Secondary | ICD-10-CM | POA: Diagnosis not present

## 2019-07-17 DIAGNOSIS — T8789 Other complications of amputation stump: Secondary | ICD-10-CM | POA: Diagnosis not present

## 2019-07-17 DIAGNOSIS — Z89511 Acquired absence of right leg below knee: Secondary | ICD-10-CM | POA: Diagnosis not present

## 2019-07-17 DIAGNOSIS — I878 Other specified disorders of veins: Secondary | ICD-10-CM | POA: Diagnosis not present

## 2019-07-17 DIAGNOSIS — L89890 Pressure ulcer of other site, unstageable: Secondary | ICD-10-CM | POA: Insufficient documentation

## 2019-07-17 DIAGNOSIS — Y835 Amputation of limb(s) as the cause of abnormal reaction of the patient, or of later complication, without mention of misadventure at the time of the procedure: Secondary | ICD-10-CM | POA: Diagnosis not present

## 2019-07-17 DIAGNOSIS — T8189XD Other complications of procedures, not elsewhere classified, subsequent encounter: Secondary | ICD-10-CM | POA: Diagnosis not present

## 2019-07-17 DIAGNOSIS — E11622 Type 2 diabetes mellitus with other skin ulcer: Secondary | ICD-10-CM | POA: Diagnosis not present

## 2019-07-17 DIAGNOSIS — R209 Unspecified disturbances of skin sensation: Secondary | ICD-10-CM | POA: Diagnosis not present

## 2019-07-19 ENCOUNTER — Telehealth (INDEPENDENT_AMBULATORY_CARE_PROVIDER_SITE_OTHER): Payer: Self-pay

## 2019-07-19 NOTE — Telephone Encounter (Signed)
Patient wife had left a message stating that her husband had got CRP lab from [facility] wound center and the inflammation value 4.6. The wound center has schedule the patient to for x ray but the patient was not able to go. The patient wife would like for someone from the office to call the [name] wound center to confirm that her husband will be getting x ray on tomorrow. Unfortunately I did explain to the patient wife that she will need contact there office to confirm his appointment. The patient wife verbalized understanding to advice.

## 2019-07-20 DIAGNOSIS — T8189XD Other complications of procedures, not elsewhere classified, subsequent encounter: Secondary | ICD-10-CM | POA: Diagnosis not present

## 2019-07-20 DIAGNOSIS — L89890 Pressure ulcer of other site, unstageable: Secondary | ICD-10-CM | POA: Diagnosis not present

## 2019-07-20 DIAGNOSIS — E11622 Type 2 diabetes mellitus with other skin ulcer: Secondary | ICD-10-CM | POA: Diagnosis not present

## 2019-07-20 DIAGNOSIS — Z89519 Acquired absence of unspecified leg below knee: Secondary | ICD-10-CM | POA: Diagnosis not present

## 2019-07-20 DIAGNOSIS — T8789 Other complications of amputation stump: Secondary | ICD-10-CM | POA: Diagnosis not present

## 2019-07-20 DIAGNOSIS — S21109A Unspecified open wound of unspecified front wall of thorax without penetration into thoracic cavity, initial encounter: Secondary | ICD-10-CM | POA: Diagnosis not present

## 2019-07-20 DIAGNOSIS — L97219 Non-pressure chronic ulcer of right calf with unspecified severity: Secondary | ICD-10-CM | POA: Diagnosis not present

## 2019-07-20 DIAGNOSIS — Y835 Amputation of limb(s) as the cause of abnormal reaction of the patient, or of later complication, without mention of misadventure at the time of the procedure: Secondary | ICD-10-CM | POA: Diagnosis not present

## 2019-07-20 DIAGNOSIS — E1142 Type 2 diabetes mellitus with diabetic polyneuropathy: Secondary | ICD-10-CM | POA: Diagnosis not present

## 2019-07-20 DIAGNOSIS — E1140 Type 2 diabetes mellitus with diabetic neuropathy, unspecified: Secondary | ICD-10-CM | POA: Diagnosis not present

## 2019-07-20 DIAGNOSIS — Z89511 Acquired absence of right leg below knee: Secondary | ICD-10-CM | POA: Diagnosis not present

## 2019-07-22 DIAGNOSIS — T8789 Other complications of amputation stump: Secondary | ICD-10-CM | POA: Diagnosis not present

## 2019-07-22 DIAGNOSIS — Z89511 Acquired absence of right leg below knee: Secondary | ICD-10-CM | POA: Diagnosis not present

## 2019-07-22 DIAGNOSIS — S98921A Partial traumatic amputation of right foot, level unspecified, initial encounter: Secondary | ICD-10-CM | POA: Diagnosis not present

## 2019-08-03 DIAGNOSIS — T8789 Other complications of amputation stump: Secondary | ICD-10-CM | POA: Diagnosis not present

## 2019-08-03 DIAGNOSIS — S98921A Partial traumatic amputation of right foot, level unspecified, initial encounter: Secondary | ICD-10-CM | POA: Diagnosis not present

## 2019-08-03 DIAGNOSIS — Z89511 Acquired absence of right leg below knee: Secondary | ICD-10-CM | POA: Diagnosis not present

## 2019-08-10 DIAGNOSIS — T8189XD Other complications of procedures, not elsewhere classified, subsequent encounter: Secondary | ICD-10-CM | POA: Diagnosis not present

## 2019-08-10 DIAGNOSIS — Y835 Amputation of limb(s) as the cause of abnormal reaction of the patient, or of later complication, without mention of misadventure at the time of the procedure: Secondary | ICD-10-CM | POA: Diagnosis not present

## 2019-08-10 DIAGNOSIS — L89890 Pressure ulcer of other site, unstageable: Secondary | ICD-10-CM | POA: Diagnosis not present

## 2019-08-10 DIAGNOSIS — T8789 Other complications of amputation stump: Secondary | ICD-10-CM | POA: Diagnosis not present

## 2019-08-10 DIAGNOSIS — E1142 Type 2 diabetes mellitus with diabetic polyneuropathy: Secondary | ICD-10-CM | POA: Diagnosis not present

## 2019-08-10 DIAGNOSIS — R209 Unspecified disturbances of skin sensation: Secondary | ICD-10-CM | POA: Diagnosis not present

## 2019-08-10 DIAGNOSIS — Z89511 Acquired absence of right leg below knee: Secondary | ICD-10-CM | POA: Diagnosis not present

## 2019-08-10 DIAGNOSIS — E11622 Type 2 diabetes mellitus with other skin ulcer: Secondary | ICD-10-CM | POA: Diagnosis not present

## 2019-08-10 DIAGNOSIS — I878 Other specified disorders of veins: Secondary | ICD-10-CM | POA: Diagnosis not present

## 2019-08-13 DIAGNOSIS — Z89511 Acquired absence of right leg below knee: Secondary | ICD-10-CM | POA: Diagnosis not present

## 2019-08-13 DIAGNOSIS — T8789 Other complications of amputation stump: Secondary | ICD-10-CM | POA: Diagnosis not present

## 2019-08-13 DIAGNOSIS — S98921A Partial traumatic amputation of right foot, level unspecified, initial encounter: Secondary | ICD-10-CM | POA: Diagnosis not present

## 2019-08-17 ENCOUNTER — Telehealth (INDEPENDENT_AMBULATORY_CARE_PROVIDER_SITE_OTHER): Payer: Self-pay

## 2019-08-17 NOTE — Telephone Encounter (Signed)
Patient is schedule to come in 08/18/2019 @10 :15 with Dr Lucky Cowboy

## 2019-08-17 NOTE — Telephone Encounter (Signed)
We certainly can see if can get [facility] in sooner to discuss whether debridement would be a feasible option or other avenues we could take.  We wont be able to prescribe an antibiotic for the patient until we assess whether there is an active infection.  Antibiotics are reserved for when there are signs/symptoms of an active infection.  In the absence of infection, antibiotics will not necessarily help the wound heal better or faster.  I would continue with prescribed wound treatments from wound center.  Based on patient preference, they can see me or [facility]

## 2019-08-18 ENCOUNTER — Encounter (INDEPENDENT_AMBULATORY_CARE_PROVIDER_SITE_OTHER): Payer: Self-pay | Admitting: Vascular Surgery

## 2019-08-18 ENCOUNTER — Ambulatory Visit (INDEPENDENT_AMBULATORY_CARE_PROVIDER_SITE_OTHER): Payer: Medicare HMO | Admitting: Vascular Surgery

## 2019-08-18 ENCOUNTER — Other Ambulatory Visit: Payer: Self-pay

## 2019-08-18 VITALS — BP 114/78 | HR 108 | Resp 16

## 2019-08-18 DIAGNOSIS — E1142 Type 2 diabetes mellitus with diabetic polyneuropathy: Secondary | ICD-10-CM | POA: Diagnosis not present

## 2019-08-18 DIAGNOSIS — I480 Paroxysmal atrial fibrillation: Secondary | ICD-10-CM | POA: Diagnosis not present

## 2019-08-18 DIAGNOSIS — Z89511 Acquired absence of right leg below knee: Secondary | ICD-10-CM

## 2019-08-18 DIAGNOSIS — Z89512 Acquired absence of left leg below knee: Secondary | ICD-10-CM

## 2019-08-18 DIAGNOSIS — I739 Peripheral vascular disease, unspecified: Secondary | ICD-10-CM

## 2019-08-18 DIAGNOSIS — E782 Mixed hyperlipidemia: Secondary | ICD-10-CM | POA: Diagnosis not present

## 2019-08-18 NOTE — Assessment & Plan Note (Signed)
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  I am highly concerned that he has now developed worsening perfusion either from malperfusion or embolization.  Either way, an angiogram will be performed for further evaluation and potential treatment.

## 2019-08-18 NOTE — Patient Instructions (Signed)
Peripheral Vascular Disease  Peripheral vascular disease (PVD) is a disease of the blood vessels that are not part of your heart and brain. A simple term for PVD is poor circulation. In most cases, PVD narrows the blood vessels that carry blood from your heart to the rest of your body. This can reduce the supply of blood to your arms, legs, and internal organs, like your stomach or kidneys. However, PVD most often affects a person's lower legs and feet. Without treatment, PVD tends to get worse. PVD can also lead to acute ischemic limb. This is when an arm or leg suddenly cannot get enough blood. This is a medical emergency. Follow these instructions at home: Lifestyle  Do not use any products that contain nicotine or tobacco, such as cigarettes and e-cigarettes. If you need help quitting, ask your doctor.  Lose weight if you are overweight. Or, stay at a healthy weight as told by your doctor.  Eat a diet that is low in fat and cholesterol. If you need help, ask your doctor.  Exercise regularly. Ask your doctor for activities that are right for you. General instructions  Take over-the-counter and prescription medicines only as told by your doctor.  Take good care of your feet: ? Wear comfortable shoes that fit well. ? Check your feet often for any cuts or sores.  Keep all follow-up visits as told by your doctor This is important. Contact a doctor if:  You have cramps in your legs when you walk.  You have leg pain when you are at rest.  You have coldness in a leg or foot.  Your skin changes.  You are unable to get or have an erection (erectile dysfunction).  You have cuts or sores on your feet that do not heal. Get help right away if:  Your arm or leg turns cold, numb, and blue.  Your arms or legs become red, warm, swollen, painful, or numb.  You have chest pain.  You have trouble breathing.  You suddenly have weakness in your face, arm, or leg.  You become very  confused or you cannot speak.  You suddenly have a very bad headache.  You suddenly cannot see. Summary  Peripheral vascular disease (PVD) is a disease of the blood vessels.  A simple term for PVD is poor circulation. Without treatment, PVD tends to get worse.  Treatment may include exercise, low fat and low cholesterol diet, and quitting smoking. This information is not intended to replace advice given to you by your health care provider. Make sure you discuss any questions you have with your health care provider. Document Revised: 07/02/2017 Document Reviewed: 08/27/2016 Elsevier Patient Education  2020 [employer]  

## 2019-08-18 NOTE — Progress Notes (Signed)
MRN : [id number]  [name]. is a 64 y.o. (09-Aug-1955) male who presents with chief complaint of  Chief Complaint  Patient presents with  . Follow-up    nonhealing wound right bka  .  History of Present Illness: Patient returns today in follow up of of his right below-knee amputation stump.  Since his last visit, he has developed a large necrotic eschar over more than half of his below-knee amputation site.  This is painful.  It has a foul odor with some dark drainage as well.  There are also skin changes overlying the patella that is not traumatic in nature.  He has developed some left leg swelling as well as swelling in the right BKA stump.  There is no clear cause or inciting event for starting this.  He remains on anticoagulation for his atrial fibrillation.  No fevers or chills.  Current Outpatient Medications  Medication Sig Dispense Refill  . acetaminophen (TYLENOL) 500 MG tablet Take 1,000 mg by mouth every 6 (six) hours as needed (for pain.).    Marland Kitchen apixaban (ELIQUIS) 5 MG TABS tablet Take 1 tablet (5 mg total) by mouth 2 (two) times daily. 180 tablet 1  . aspirin 81 MG EC tablet TAKE 1 TABLET BY MOUTH EVERY DAY 90 tablet 4  . Calcium Carb-Cholecalciferol (CALCIUM 600-D PO) Take by mouth daily.    . cholecalciferol (VITAMIN D3) 25 MCG (1000 UT) tablet Take 5,000 Units by mouth daily.     Marland Kitchen docusate sodium (COLACE) 100 MG capsule Take 1 capsule (100 mg total) by mouth daily. Do not take if you have loose stools 90 capsule 2  . donepezil (ARICEPT) 5 MG tablet Take 5 mg by mouth at bedtime.    . empagliflozin (JARDIANCE) 10 MG TABS tablet Take 5 mg by mouth daily. 90 tablet 1  . ezetimibe-simvastatin (VYTORIN) 10-40 MG tablet Take 1 tablet by mouth daily at 6 PM. 90 tablet 2  . gabapentin (NEURONTIN) 800 MG tablet Take 1 tablet (800 mg total) by mouth 3 (three) times daily. 270 tablet 1  . hydrochlorothiazide (MICROZIDE) 12.5 MG capsule Take 1 capsule (12.5 mg total) by mouth  daily. 45 capsule 1  . lansoprazole (PREVACID) 30 MG capsule Take 30 mg by mouth daily at 12 noon.    . metFORMIN (GLUCOPHAGE) 1000 MG tablet Take 1 tablet (1,000 mg total) by mouth 2 (two) times daily with a meal. 180 tablet 3  . metoprolol tartrate (LOPRESSOR) 25 MG tablet Take 1 tablet (25 mg total) by mouth 2 (two) times daily. 180 tablet 3  . Multiple Vitamin (MULTIVITAMIN WITH MINERALS) TABS tablet Take 1 tablet by mouth daily. 30 tablet 0  . ONE TOUCH ULTRA TEST test strip USE TO TEST BLOOD SUGAR BID 100 each 12  . ONETOUCH DELICA LANCETS 61P MISC USE AS DIRECTED TO TEST BLOOD SUGAR BID 100 each 12  . vitamin C (ASCORBIC ACID) 500 MG tablet Take 500 mg by mouth daily.    . ferrous sulfate (FERROUSUL) 325 (65 FE) MG tablet Take 1 tablet (325 mg total) by mouth 2 (two) times daily with a meal. (Patient not taking: Reported on 07/14/2019) 120 tablet 3   No current facility-administered medications for this visit.    Past Medical History:  Diagnosis Date  . Acute left PCA stroke ([place]) 02/17/2015  . Anemia   . Atherosclerosis of native arteries of the extremities with gangrene ([place]) 05/08/2018  . Atrial fibrillation ([place])   .  Blind    right eye  . Diabetes mellitus with complication ([place])   . Dilated aortic root ([place])    a. 04/2018 Echo: 4.1cm. Asc Ao 3.5cm.  Marland Kitchen Dysrhythmia    a fib  . Gangrene of right foot ([place]) 06/02/2018  . GERD (gastroesophageal reflux disease)   . History of echocardiogram    a. 04/2018 Echo: Ef 60-65%, no rwma, midly to mod dil Ao root - 4.1cm. Asc Ao 3.5cm. Mild MR. Nl RV fxn. Nl PASP.  Marland Kitchen History of hernia repair   . History of stress test    a. 2016 MV ([name]): EF 58%, no ischemia.  . Hyperlipidemia   . Hypertension   . Leg pain   . Legally blind   . Necrotic toes ([place]) 02/21/2018  . PAD (peripheral artery disease) ([place])   . PAF (paroxysmal atrial fibrillation) ([name])    a. on Eliquis as of 2018; b. CHADS2VASc => 5 (HTN, DM, stroke x 2, vascular disease)    . Peripheral vascular disease ([place])    a. followed by Dr. Lucky Cowboy; b. s/p kissing balloon stents and right external iliac stent in 10/2017; c. LE angiogrpahy 01/2018: No significant arterial occlusive disease in the lower extremities.  . Pulmonary nodules   . Stroke [facility])    a. 2016 & 2018  . Stroke [facility]) 07/11/2018    Past Surgical History:  Procedure Laterality Date  . AMPUTATION Right 06/02/2018   Procedure: AMPUTATION BELOW KNEE;  Surgeon: [name], MD;  Location: [facility] ORS;  Service: Vascular;  Laterality: Right;  . APPENDECTOMY    . COLONOSCOPY WITH PROPOFOL N/A 05/12/2018   Procedure: COLONOSCOPY WITH PROPOFOL;  Surgeon: [name], MD;  Location: [facility] ENDOSCOPY;  Service: Gastroenterology;  Laterality: N/A;  . ESOPHAGOGASTRODUODENOSCOPY (EGD) WITH PROPOFOL N/A 05/12/2018   Procedure: ESOPHAGOGASTRODUODENOSCOPY (EGD) WITH PROPOFOL;  Surgeon: [name], MD;  Location: [facility] ENDOSCOPY;  Service: Gastroenterology;  Laterality: N/A;  . GIVENS CAPSULE STUDY N/A 07/13/2018   Procedure: GIVENS CAPSULE STUDY;  Surgeon: [name], MD;  Location: [facility]COPY;  Service: Gastroenterology;  Laterality: N/A;  . HERNIA REPAIR     UMBILICAL  . LOWER EXTREMITY ANGIOGRAPHY Right 10/25/2017   Procedure: LOWER EXTREMITY ANGIOGRAPHY;  Surgeon: [name], MD;  Location: [place] CV LAB;  Service: Cardiovascular;  Laterality: Right;  . LOWER EXTREMITY ANGIOGRAPHY Right 01/13/2018   Procedure: LOWER EXTREMITY ANGIOGRAPHY;  Surgeon: [name], MD;  Location: [place] CV LAB;  Service: Cardiovascular;  Laterality: Right;  . LOWER EXTREMITY INTERVENTION  10/25/2017   Procedure: LOWER EXTREMITY INTERVENTION;  Surgeon: [name], MD;  Location: [place] CV LAB;  Service: Cardiovascular;;  . SKIN SPLIT GRAFT Right 12/28/2018   Procedure: SKIN GRAFT SPLIT THICKNESS ( SYNTHETIC );  Surgeon: [name], MD;  Location: [facility] ORS;  Service: Vascular;  Laterality: Right;  . TONSILLECTOMY     . WOUND DEBRIDEMENT Right 08/08/2018   Procedure: DEBRIDEMENT WOUND WITH WOUND VAC APPLICATION;  Surgeon: [name], MD;  Location: [facility] ORS;  Service: Vascular;  Laterality: Right;   Social History        Tobacco Use  . Smoking status: Former Smoker    Packs/day: 1.00    Years: 30.00    Pack years: 30.00    Types: Cigarettes    Quit date: 01/09/2018    Years since quitting: 1.3  . Smokeless tobacco: Never Used  Substance Use Topics  . Alcohol use: Never    Alcohol/week:  0.0 standard drinks    Frequency: Never    Comment: occasionally beer   . Drug use: No     Family History      Family History  Problem Relation Age of Onset  . Diabetes Father   . Hypertension Father   . Hyperlipidemia Father   no bleeding or clotting disorders      Allergies  Allergen Reactions  . Sodium Pentobarbital [Pentobarbital] Shortness Of Breath  . Lipitor [Atorvastatin] Rash     REVIEW OF SYSTEMS(Negative unless checked)  Constitutional: [] ??Weight loss [] ??Fever [] ??Chills Cardiac: [] ??Chest pain [] ??Chest pressure [] ??Palpitations [] ??Shortness of breath when laying flat [] ??Shortness of breath at rest [] ??Shortness of breath with exertion. Vascular: [] ??Pain in legs with walking [] ??Pain in legs at rest [] ??Pain in legs when laying flat [] ??Claudication [] ??Pain in feet when walking [] ??Pain in feet at rest [] ??Pain in feet when laying flat [] ??History of DVT [] ??Phlebitis [] ??Swelling in legs [] ??Varicose veins [x] ??Non-healing ulcers Pulmonary: [] ??Uses home oxygen [] ??Productive cough [] ??Hemoptysis [] ??Wheeze [] ??COPD [] ??Asthma Neurologic: [] ??Dizziness [] ??Blackouts [] ??Seizures [x] ??History of stroke [] ??History of TIA [] ??Aphasia [] ??Temporary blindness [] ??Dysphagia [] ??Weakness or numbness in arms [] ??Weakness or numbness in legs Musculoskeletal: [x] ??Arthritis [] ??Joint swelling  [] ??Joint pain [] ??Low back pain Hematologic: [] ??Easy bruising [] ??Easy bleeding [] ??Hypercoagulable state [] ??Anemic  Gastrointestinal: [] ??Blood in stool [] ??Vomiting blood [] ??Gastroesophageal reflux/heartburn [] ??Abdominal pain Genitourinary: [] ??Chronic kidney disease [] ??Difficult urination [] ??Frequent urination [] ??Burning with urination [] ??Hematuria Skin: [] ??Rashes [x] ??Ulcers [x] ??Wounds Psychological: [] ??History of anxiety [] ??History of major depression.    Physical Examination  BP 114/78 (BP Location: Left Arm)   Pulse (!) 108   Resp 16  Gen:  WD/WN, NAD Head: [place]/AT, No temporalis wasting. Ear/Nose/Throat: Hearing grossly intact, nares w/o erythema or drainage Eyes: Conjunctiva clear. Sclera non-icteric Neck: Supple.  Trachea midline Pulmonary:  Good air movement, no use of accessory muscles.  Cardiac: Irregular Vascular:  Vessel Right Left  Radial Palpable Palpable                          PT NP Trace   DP NP 1+    Musculoskeletal: M/S 5/5 throughout.  His right BKA stump has significant necrotic eschar over most of the wound.  There is also livedo worrisome for embolization overlying the patella itself.  There is 1-2+ edema in the stump as well as 1+ left lower extremity edema. Neurologic: Sensation grossly intact in extremities.  Symmetrical.  Speech is pressured and he repeats himself persistently Psychiatric: Judgment and insight appear fair Dermatologic: His large right BKA wound has developed necrotic eschar over on at least half of the wound.       Labs Recent Results (from the past 2160 hour(s))  Bayer DCA Hb A1c Waived     Status: None   Collection Time: 07/03/19  3:13 PM  Result Value Ref Range   HB A1C (BAYER DCA - WAIVED) 6.8 <7.0 %    Comment:                                       Diabetic Adult            <7.0                                       Healthy Adult        4.3 -  5.7                                                            (DCCT/NGSP) American Diabetes Association's Summary of Glycemic Recommendations for Adults with Diabetes: Hemoglobin A1c <7.0%. More stringent glycemic goals (A1c <6.0%) may further reduce complications at the cost of increased risk of hypoglycemia.   Microalbumin, Urine Waived     Status: None   Collection Time: 07/03/19  3:13 PM  Result Value Ref Range   Microalb, Ur Waived 10 0 - 19 mg/L   Creatinine, Urine Waived 50 10 - 300 mg/dL   Microalb/Creat Ratio <30 <30 mg/g    Comment:                              Abnormal:       30 - 300                         High Abnormal:           >300   UA/M w/rflx Culture, Routine     Status: Abnormal   Collection Time: 07/03/19  3:13 PM   Specimen: Urine   URINE  Result Value Ref Range   Specific Gravity, UA 1.020 1.005 - 1.030   pH, UA 6.0 5.0 - 7.5   Color, UA Yellow Yellow   Appearance Ur Clear Clear   Leukocytes,UA Negative Negative   Protein,UA Negative Negative/Trace   Glucose, UA 3+ (A) Negative   Ketones, UA Negative Negative   RBC, UA Negative Negative   Bilirubin, UA Negative Negative   Urobilinogen, Ur 1.0 0.2 - 1.0 mg/dL   Nitrite, UA Negative Negative  CBC with Differential OUT     Status: None   Collection Time: 07/03/19  3:18 PM  Result Value Ref Range   WBC 7.4 3.4 - 10.8 x10E3/uL   RBC 4.84 4.14 - 5.80 x10E6/uL   Hemoglobin 13.4 13.0 - 17.7 g/dL   Hematocrit 42.5 37.5 - 51.0 %   MCV 88 79 - 97 fL   MCH 27.7 26.6 - 33.0 pg   MCHC 31.5 31.5 - 35.7 g/dL   RDW 12.6 11.6 - 15.4 %   Platelets 225 150 - 450 x10E3/uL   Neutrophils 73 Not Estab. %   Lymphs 16 Not Estab. %   Monocytes 6 Not Estab. %   Eos 4 Not Estab. %   Basos 1 Not Estab. %   Neutrophils Absolute 5.4 1.4 - 7.0 x10E3/uL   Lymphocytes Absolute 1.2 0.7 - 3.1 x10E3/uL   Monocytes Absolute 0.5 0.1 - 0.9 x10E3/uL   EOS (ABSOLUTE) 0.3 0.0 - 0.4 x10E3/uL   Basophils Absolute 0.0 0.0 - 0.2 x10E3/uL   Immature Granulocytes 0  Not Estab. %   Immature Grans (Abs) 0.0 0.0 - 0.1 x10E3/uL  Comp Met (CMET)     Status: Abnormal   Collection Time: 07/03/19  3:18 PM  Result Value Ref Range   Glucose 138 (H) 65 - 99 mg/dL   BUN 19 8 - 27 mg/dL   Creatinine, Ser 0.64 (L) 0.76 - 1.27 mg/dL   GFR calc non Af Amer 103 >59 mL/min/1.73   GFR calc Af Amer 120 >59 mL/min/1.73  BUN/Creatinine Ratio 30 (H) 10 - 24   Sodium 142 134 - 144 mmol/L   Potassium 4.1 3.5 - 5.2 mmol/L   Chloride 101 96 - 106 mmol/L   CO2 24 20 - 29 mmol/L   Calcium 10.0 8.6 - 10.2 mg/dL   Total Protein 7.1 6.0 - 8.5 g/dL   Albumin 4.7 3.8 - 4.8 g/dL   Globulin, Total 2.4 1.5 - 4.5 g/dL   Albumin/Globulin Ratio 2.0 1.2 - 2.2   Bilirubin Total 0.5 0.0 - 1.2 mg/dL   Alkaline Phosphatase 90 39 - 117 IU/L   AST 21 0 - 40 IU/L   ALT 20 0 - 44 IU/L  Lipid Panel w/o Chol/HDL Ratio OUT     Status: None   Collection Time: 07/03/19  3:18 PM  Result Value Ref Range   Cholesterol, Total 110 100 - 199 mg/dL   Triglycerides 99 0 - 149 mg/dL   HDL 47 >39 mg/dL   VLDL Cholesterol Cal 19 5 - 40 mg/dL   LDL Chol Calc (NIH) 44 0 - 99 mg/dL  PSA     Status: None   Collection Time: 07/03/19  3:18 PM  Result Value Ref Range   Prostate Specific Ag, Serum 0.5 0.0 - 4.0 ng/mL    Comment: Roche ECLIA methodology. According to the American Urological Association, Serum PSA should decrease and remain at undetectable levels after radical prostatectomy. The AUA defines biochemical recurrence as an initial PSA value 0.2 ng/mL or greater followed by a subsequent confirmatory PSA value 0.2 ng/mL or greater. Values obtained with different assay methods or kits cannot be used interchangeably. Results cannot be interpreted as absolute evidence of the presence or absence of malignant disease.   TSH     Status: None   Collection Time: 07/03/19  3:18 PM  Result Value Ref Range   TSH 2.170 0.450 - 4.500 uIU/mL  Vit D  25 hydroxy (rtn osteoporosis monitoring)     Status:  None   Collection Time: 07/03/19  3:18 PM  Result Value Ref Range   Vit D, 25-Hydroxy 30.1 30.0 - 100.0 ng/mL    Comment: Vitamin D deficiency has been defined by the [place] practice guideline as a level of serum 25-OH vitamin D less than 20 ng/mL (1,2). The Endocrine Society went on to further define vitamin D insufficiency as a level between 21 and 29 ng/mL (2). 1. IOM (Institute of Medicine). 2010. Dietary reference    intakes for calcium and D. [place]: The    [employer]. 2. Holick MF, Binkley [place], Bischoff-Ferrari HA, et al.    Evaluation, treatment, and prevention of vitamin D    deficiency: an Endocrine Society clinical practice    guideline. JCEM. 2011 Jul; 96(7):1911-30.   Iron Binding Cap (TIBC)     Status: None   Collection Time: 07/03/19  3:18 PM  Result Value Ref Range   Total Iron Binding Capacity 378 250 - 450 ug/dL   UIBC 319 111 - 343 ug/dL   Iron 59 38 - 169 ug/dL   Iron Saturation 16 15 - 55 %  Ferritin     Status: None   Collection Time: 07/03/19  3:18 PM  Result Value Ref Range   Ferritin 42 30 - 400 ng/mL  Iron and TIBC     Status: None   Collection Time: 07/12/19  1:35 PM  Result Value Ref Range   Iron 80 45 - 182 ug/dL  TIBC 430 250 - 450 ug/dL   Saturation Ratios 19 17.9 - 39.5 %   UIBC 350 ug/dL    Comment: Performed at [facility], [address] [place], [place] [address]  Ferritin     Status: None   Collection Time: 07/12/19  1:35 PM  Result Value Ref Range   Ferritin 29 24 - 336 ng/mL    Comment: Performed at [facility], [address] [place], [place] [address]  CBC with Differential/Platelet     Status: Abnormal   Collection Time: 07/12/19  1:35 PM  Result Value Ref Range   WBC 8.5 4.0 - 10.5 K/uL   RBC 4.62 4.22 - 5.81 MIL/uL   Hemoglobin 12.7 (L) 13.0 - 17.0 g/dL   HCT 40.9 39.0 - 52.0 %   MCV 88.5 80.0 - 100.0 fL   MCH 27.5 26.0 - 34.0 pg   MCHC  31.1 30.0 - 36.0 g/dL   RDW 13.0 11.5 - 15.5 %   Platelets 187 150 - 400 K/uL   nRBC 0.0 0.0 - 0.2 %   Neutrophils Relative % 71 %   Neutro Abs 6.1 1.7 - 7.7 K/uL   Lymphocytes Relative 17 %   Lymphs Abs 1.5 0.7 - 4.0 K/uL   Monocytes Relative 7 %   Monocytes Absolute 0.6 0.1 - 1.0 K/uL   Eosinophils Relative 4 %   Eosinophils Absolute 0.3 0.0 - 0.5 K/uL   Basophils Relative 1 %   Basophils Absolute 0.0 0.0 - 0.1 K/uL   Immature Granulocytes 0 %   Abs Immature Granulocytes 0.03 0.00 - 0.07 K/uL    Comment: Performed at [facility], [address], [place], [place] [address]    Radiology No results found.  Assessment/Plan A-fib ([name]) On anticoagulation.  Hyperlipidemia lipid control important in reducing the progression of atherosclerotic disease. Continue statin therapy   Diabetic peripheral neuropathy ([name]) blood glucose control important in reducing the progression of atherosclerotic disease. Also, involved in wound healing. On appropriate medications.  PVD (peripheral vascular disease) ([place]) Although his perfusion was good when it was checked several months back, he has new findings worrisome for embolization with skin changes at the knee as well as a marked deterioration in his right BKA stump.  I am highly concerned that he has now developed worsening perfusion either from malperfusion or embolization.  Either way, an angiogram will be performed for further evaluation and potential treatment.  S/P bilateral BKA (below knee amputation) ([place]) The patient has had a chronic wound of his right BKA for well over a year.  At last check a few months ago, the wound looked quite good with excellent granulation tissue.  It has markedly worsened and has necrotic eschar and more exudate.  This will need to be debrided.  I discussed with the patient the grave nature of the situation and the very high risk for need for more proximal amputation.  We are going to perform an  angiogram to evaluate his perfusion and look for a source of embolization as well as debride the wound.  These will be done in the coming weeks at his convenience.    Leotis Pain, MD  08/18/2019 3:49 PM    This note was created with Dragon medical transcription system.  Any errors from dictation are purely unintentional

## 2019-08-18 NOTE — Assessment & Plan Note (Signed)
The patient has had a chronic wound of his right BKA for well over a year.  At last check a few months ago, the wound looked quite good with excellent granulation tissue.  It has markedly worsened and has necrotic eschar and more exudate.  This will need to be debrided.  I discussed with the patient the grave nature of the situation and the very high risk for need for more proximal amputation.  We are going to perform an angiogram to evaluate his perfusion and look for a source of embolization as well as debride the wound.  These will be done in the coming weeks at his convenience.

## 2019-08-21 ENCOUNTER — Telehealth (INDEPENDENT_AMBULATORY_CARE_PROVIDER_SITE_OTHER): Payer: Self-pay

## 2019-08-21 ENCOUNTER — Encounter (INDEPENDENT_AMBULATORY_CARE_PROVIDER_SITE_OTHER): Payer: Self-pay

## 2019-08-21 NOTE — Telephone Encounter (Signed)
Spoke with the patient and his wife and he is now scheduled for a right leg angio with Dr. Lucky Cowboy on 08/28/19 with a 9:00 am arrival time to the [facility]. Patient will do his covid testing on 08/24/19 between 12:30-2:30 pm at the [facility]. Pre-procedure instructions were discussed and will be mailed to the patient. Patient's wife questioned about him having the debridement and I explained that Dr. Lucky Cowboy wanted the angiogram first.

## 2019-08-22 DIAGNOSIS — Z89511 Acquired absence of right leg below knee: Secondary | ICD-10-CM | POA: Diagnosis not present

## 2019-08-22 DIAGNOSIS — S98921A Partial traumatic amputation of right foot, level unspecified, initial encounter: Secondary | ICD-10-CM | POA: Diagnosis not present

## 2019-08-22 DIAGNOSIS — T8789 Other complications of amputation stump: Secondary | ICD-10-CM | POA: Diagnosis not present

## 2019-08-24 ENCOUNTER — Other Ambulatory Visit
Admission: RE | Admit: 2019-08-24 | Discharge: 2019-08-24 | Disposition: A | Discharge status: Home / Self Care | Payer: Medicare HMO | Source: Ambulatory Visit | Attending: Vascular Surgery | Admitting: Vascular Surgery

## 2019-08-24 DIAGNOSIS — Z20822 Contact with and (suspected) exposure to covid-19: Secondary | ICD-10-CM | POA: Diagnosis not present

## 2019-08-24 DIAGNOSIS — Z01812 Encounter for preprocedural laboratory examination: Secondary | ICD-10-CM | POA: Diagnosis not present

## 2019-08-24 LAB — SARS CORONAVIRUS 2 (TAT 6-24 HRS): SARS Coronavirus 2: NEGATIVE

## 2019-08-27 ENCOUNTER — Other Ambulatory Visit (INDEPENDENT_AMBULATORY_CARE_PROVIDER_SITE_OTHER): Payer: Self-pay | Admitting: Nurse Practitioner

## 2019-08-28 ENCOUNTER — Encounter: Payer: Self-pay | Admitting: Vascular Surgery

## 2019-08-28 ENCOUNTER — Encounter
Admission: RE | Disposition: A | Discharge status: Home / Self Care | Payer: Self-pay | Source: Home / Self Care | Attending: Vascular Surgery

## 2019-08-28 ENCOUNTER — Ambulatory Visit
Admission: RE | Admit: 2019-08-28 | Discharge: 2019-08-28 | Disposition: A | Discharge status: Home / Self Care | Payer: Medicare HMO | Attending: Vascular Surgery | Admitting: Vascular Surgery

## 2019-08-28 ENCOUNTER — Other Ambulatory Visit: Payer: Self-pay

## 2019-08-28 DIAGNOSIS — E1142 Type 2 diabetes mellitus with diabetic polyneuropathy: Secondary | ICD-10-CM | POA: Diagnosis not present

## 2019-08-28 DIAGNOSIS — E11621 Type 2 diabetes mellitus with foot ulcer: Secondary | ICD-10-CM | POA: Insufficient documentation

## 2019-08-28 DIAGNOSIS — Z79899 Other long term (current) drug therapy: Secondary | ICD-10-CM | POA: Insufficient documentation

## 2019-08-28 DIAGNOSIS — E1151 Type 2 diabetes mellitus with diabetic peripheral angiopathy without gangrene: Secondary | ICD-10-CM | POA: Diagnosis present

## 2019-08-28 DIAGNOSIS — Z7982 Long term (current) use of aspirin: Secondary | ICD-10-CM | POA: Diagnosis not present

## 2019-08-28 DIAGNOSIS — Z7901 Long term (current) use of anticoagulants: Secondary | ICD-10-CM | POA: Insufficient documentation

## 2019-08-28 DIAGNOSIS — Z888 Allergy status to other drugs, medicaments and biological substances status: Secondary | ICD-10-CM | POA: Diagnosis not present

## 2019-08-28 DIAGNOSIS — L97814 Non-pressure chronic ulcer of other part of right lower leg with necrosis of bone: Secondary | ICD-10-CM | POA: Diagnosis not present

## 2019-08-28 DIAGNOSIS — I10 Essential (primary) hypertension: Secondary | ICD-10-CM | POA: Insufficient documentation

## 2019-08-28 DIAGNOSIS — Z89512 Acquired absence of left leg below knee: Secondary | ICD-10-CM | POA: Diagnosis not present

## 2019-08-28 DIAGNOSIS — I70242 Atherosclerosis of native arteries of left leg with ulceration of calf: Secondary | ICD-10-CM | POA: Diagnosis not present

## 2019-08-28 DIAGNOSIS — Z8673 Personal history of transient ischemic attack (TIA), and cerebral infarction without residual deficits: Secondary | ICD-10-CM | POA: Insufficient documentation

## 2019-08-28 DIAGNOSIS — H547 Unspecified visual loss: Secondary | ICD-10-CM | POA: Diagnosis not present

## 2019-08-28 DIAGNOSIS — Z833 Family history of diabetes mellitus: Secondary | ICD-10-CM | POA: Diagnosis not present

## 2019-08-28 DIAGNOSIS — K219 Gastro-esophageal reflux disease without esophagitis: Secondary | ICD-10-CM | POA: Insufficient documentation

## 2019-08-28 DIAGNOSIS — I70238 Atherosclerosis of native arteries of right leg with ulceration of other part of lower right leg: Secondary | ICD-10-CM | POA: Insufficient documentation

## 2019-08-28 DIAGNOSIS — E782 Mixed hyperlipidemia: Secondary | ICD-10-CM | POA: Insufficient documentation

## 2019-08-28 DIAGNOSIS — Z8249 Family history of ischemic heart disease and other diseases of the circulatory system: Secondary | ICD-10-CM | POA: Insufficient documentation

## 2019-08-28 DIAGNOSIS — I480 Paroxysmal atrial fibrillation: Secondary | ICD-10-CM | POA: Diagnosis not present

## 2019-08-28 DIAGNOSIS — I739 Peripheral vascular disease, unspecified: Secondary | ICD-10-CM

## 2019-08-28 DIAGNOSIS — Z87891 Personal history of nicotine dependence: Secondary | ICD-10-CM | POA: Insufficient documentation

## 2019-08-28 DIAGNOSIS — Z89511 Acquired absence of right leg below knee: Secondary | ICD-10-CM | POA: Insufficient documentation

## 2019-08-28 DIAGNOSIS — I70232 Atherosclerosis of native arteries of right leg with ulceration of calf: Secondary | ICD-10-CM | POA: Diagnosis not present

## 2019-08-28 DIAGNOSIS — Z7984 Long term (current) use of oral hypoglycemic drugs: Secondary | ICD-10-CM | POA: Diagnosis not present

## 2019-08-28 HISTORY — PX: LOWER EXTREMITY ANGIOGRAPHY: CATH118251

## 2019-08-28 LAB — CREATININE, SERUM
Creatinine, Ser: 0.58 mg/dL — ABNORMAL LOW (ref 0.61–1.24)
GFR calc Af Amer: >60 mL/min (ref >60)
GFR calc non Af Amer: >60 mL/min (ref >60)

## 2019-08-28 LAB — GLUCOSE, CAPILLARY
Glucose-Capillary: 161 mg/dL — ABNORMAL HIGH (ref 70–99)
Glucose-Capillary: 154 mg/dL — ABNORMAL HIGH (ref 70–99)

## 2019-08-28 LAB — BUN: BUN: 18 mg/dL (ref 8–23)

## 2019-08-28 SURGERY — LOWER EXTREMITY ANGIOGRAPHY
Anesthesia: Moderate Sedation | Site: Leg Lower | Laterality: Right

## 2019-08-28 MED ORDER — CEFAZOLIN SODIUM-DEXTROSE 2-4 GM/100ML-% IV SOLN
INTRAVENOUS | Status: AC
  Administered 2019-08-28: 2 g via INTRAVENOUS
  Filled 2019-08-28: qty 100

## 2019-08-28 MED ORDER — LABETALOL HCL 5 MG/ML IV SOLN: 10.0000 mg | INTRAVENOUS | Status: DC | PRN

## 2019-08-28 MED ORDER — SODIUM CHLORIDE 0.9% FLUSH: 3.0000 mL | INTRAVENOUS | Status: DC | PRN

## 2019-08-28 MED ORDER — MIDAZOLAM HCL 2 MG/ML PO SYRP: 8.0000 mg | ORAL_SOLUTION | Freq: Once | ORAL | Status: DC | PRN

## 2019-08-28 MED ORDER — SODIUM CHLORIDE 0.9% FLUSH: 3.0000 mL | Freq: Two times a day (BID) | INTRAVENOUS | Status: DC

## 2019-08-28 MED ORDER — MIDAZOLAM HCL 5 MG/5ML IJ SOLN
INTRAMUSCULAR | Status: AC
  Filled 2019-08-28: qty 5

## 2019-08-28 MED ORDER — HEPARIN SODIUM (PORCINE) 1000 UNIT/ML IJ SOLN
INTRAMUSCULAR | Status: DC | PRN
  Administered 2019-08-28: 5000 [IU] via INTRAVENOUS

## 2019-08-28 MED ORDER — SODIUM CHLORIDE 0.9 % IV SOLN: INTRAVENOUS | Status: DC

## 2019-08-28 MED ORDER — HYDROMORPHONE HCL 1 MG/ML IJ SOLN
1.0000 mg | Freq: Once | INTRAMUSCULAR | Status: AC
  Administered 2019-08-28: 1 mg via INTRAVENOUS

## 2019-08-28 MED ORDER — CEFAZOLIN SODIUM-DEXTROSE 2-4 GM/100ML-% IV SOLN: 2.0000 g | Freq: Once | INTRAVENOUS | Status: AC

## 2019-08-28 MED ORDER — HYDROMORPHONE HCL 1 MG/ML IJ SOLN
INTRAMUSCULAR | Status: AC
  Filled 2019-08-28: qty 1

## 2019-08-28 MED ORDER — ONDANSETRON HCL 4 MG/2ML IJ SOLN: 4.0000 mg | Freq: Four times a day (QID) | INTRAMUSCULAR | Status: DC | PRN

## 2019-08-28 MED ORDER — DIPHENHYDRAMINE HCL 50 MG/ML IJ SOLN: 50.0000 mg | Freq: Once | INTRAMUSCULAR | Status: DC | PRN

## 2019-08-28 MED ORDER — ACETAMINOPHEN 325 MG PO TABS: 650.0000 mg | ORAL_TABLET | ORAL | Status: DC | PRN

## 2019-08-28 MED ORDER — METHYLPREDNISOLONE SODIUM SUCC 125 MG IJ SOLR: 125.0000 mg | Freq: Once | INTRAMUSCULAR | Status: DC | PRN

## 2019-08-28 MED ORDER — FAMOTIDINE 20 MG PO TABS: 40.0000 mg | ORAL_TABLET | Freq: Once | ORAL | Status: DC | PRN

## 2019-08-28 MED ORDER — FENTANYL CITRATE (PF) 100 MCG/2ML IJ SOLN
INTRAMUSCULAR | Status: DC | PRN
  Administered 2019-08-28 (×2): 50 ug via INTRAVENOUS

## 2019-08-28 MED ORDER — HEPARIN SODIUM (PORCINE) 1000 UNIT/ML IJ SOLN
INTRAMUSCULAR | Status: AC
  Filled 2019-08-28: qty 1

## 2019-08-28 MED ORDER — FENTANYL CITRATE (PF) 100 MCG/2ML IJ SOLN
INTRAMUSCULAR | Status: AC
  Filled 2019-08-28: qty 2

## 2019-08-28 MED ORDER — HYDRALAZINE HCL 20 MG/ML IJ SOLN: 5.0000 mg | INTRAMUSCULAR | Status: DC | PRN

## 2019-08-28 MED ORDER — HYDROMORPHONE HCL 1 MG/ML IJ SOLN: 1.0000 mg | Freq: Once | INTRAMUSCULAR | Status: DC | PRN

## 2019-08-28 MED ORDER — MIDAZOLAM HCL 2 MG/2ML IJ SOLN
INTRAMUSCULAR | Status: DC | PRN
  Administered 2019-08-28: 1 mg via INTRAVENOUS
  Administered 2019-08-28: 2 mg via INTRAVENOUS

## 2019-08-28 MED ORDER — SODIUM CHLORIDE 0.9 % IV SOLN: 250.0000 mL | INTRAVENOUS | Status: DC | PRN

## 2019-08-28 SURGICAL SUPPLY — 17 items
BALLN LUTONIX AV 7X60X75 (BALLOONS) ×2
BALLN ULTRVRSE 8X40X75C (BALLOONS) ×2
BALLN ULTRVRSE 8X60X75C (BALLOONS) ×2
BALLOON LUTONIX AV 7X60X75 (BALLOONS) ×1 IMPLANT
BALLOON ULTRVRSE 8X40X75C (BALLOONS) ×1 IMPLANT
BALLOON ULTRVRSE 8X60X75C (BALLOONS) ×1 IMPLANT
CATH PIG 70CM (CATHETERS) ×2 IMPLANT
DEVICE PRESTO INFLATION (MISCELLANEOUS) ×4 IMPLANT
DEVICE STARCLOSE SE CLOSURE (Vascular Products) ×4 IMPLANT
GLIDECATH 4FR STR (CATHETERS) ×2 IMPLANT
GLIDEWIRE ADV .035X180CM (WIRE) ×2 IMPLANT
PACK ANGIOGRAPHY (CUSTOM PROCEDURE TRAY) ×2 IMPLANT
SHEATH BRITE TIP 5FRX11 (SHEATH) ×4 IMPLANT
SYR MEDRAD MARK 7 150ML (SYRINGE) ×2 IMPLANT
TUBING CONTRAST HIGH PRESS 72 (TUBING) ×2 IMPLANT
WIRE J 3MM .035X145CM (WIRE) ×2 IMPLANT
WIRE MAGIC TOR.035 180C (WIRE) ×2 IMPLANT

## 2019-08-28 NOTE — Op Note (Signed)
[facility]  Percutaneous Study/Intervention Procedural Note   Date of Surgery: 08/28/2019  Surgeon(s):Kacper Cartlidge    Assistants:none  Pre-operative Diagnosis: PAD with nonhealing right BKA wound, status post bilateral aortoiliac intervention  Post-operative diagnosis:  Same  Procedure(s) Performed:             1.  Ultrasound guidance for vascular access bilateral femoral arteries             2.  Catheter placement into right common femoral artery from left femoral approach and into the aorta from the right femoral approach             3.  Aortogram and selective right lower extremity angiogram             4.  Percutaneous transluminal angioplasty of bilateral common iliac arteries in a kissing balloon fashion using 8 mm diameter by 6 cm length balloon on the right and 8 mm diameter by 4 cm length balloon on the left             5.  Percutaneous transluminal angioplasty of the right external iliac artery for separate and distinct distal stenosis at the distal edge of the previous stents with 7 mm diameter by 6 cm length Lutonix drug-coated balloon  6.  StarClose closure device bilateral femoral artery  EBL: 10 cc  Contrast: 105 cc  Fluoro Time: 5.4 minutes  Moderate Conscious Sedation Time: approximately 30 minutes using 3 mg of Versed and 100 mcg of Fentanyl              Indications:  Patient is a 64 y.o.male with breakdown of his right BKA stump wound.  This has continued to worsen.  He has a previous history of aortoiliac disease status post aortoiliac intervention.The patient is brought in for angiography for further evaluation and potential treatment.  Due to the limb threatening nature of the situation, angiogram was performed for attempted limb salvage. The patient is aware that if the procedure fails, amputation would be expected.  The patient also understands that even with successful revascularization, amputation may still be required due to the severity  of the situation.  Risks and benefits are discussed and informed consent is obtained.   Procedure:  The patient was identified and appropriate procedural time out was performed.  The patient was then placed supine on the table and prepped and draped in the usual sterile fashion. Moderate conscious sedation was administered during a face to face encounter with the patient throughout the procedure with my supervision of the RN administering medicines and monitoring the patient's vital signs, pulse oximetry, telemetry and mental status throughout from the start of the procedure until the patient was taken to the recovery room. Ultrasound was used to evaluate the left common femoral artery.  It was patent .  A digital ultrasound image was acquired.  A Seldinger needle was used to access the left common femoral artery under direct ultrasound guidance and a permanent image was performed.  A 0.035 J wire was advanced without resistance and a 5Fr sheath was placed.  Pigtail catheter was placed into the aorta and an AP aortogram was performed. This demonstrated normal renal arteries and the aorta was reasonably normal until the bifurcation.  There was mild to moderate stenosis of the left common iliac artery stent that had been previously placed.  The right common iliac artery stent had moderate stenosis in the 60% range that looked quite irregular and was worrisome for a  possible embolic lesion.  The stents then normalized down to the bottom of the second stent which demonstrated hyperplastic stenosis in the 60 to 70% range. I then crossed the aortic bifurcation and advanced to the right femoral head. Selective right lower extremity angiogram was then performed. This demonstrated relatively normal common femoral artery, profunda femoris artery, SFA and popliteal artery down to the knee with large geniculate collaterals into the stump. It was felt that it was in the patient's best interest to proceed with intervention  after these images to avoid a second procedure and a larger amount of contrast and fluoroscopy based off of the findings from the initial angiogram.  It was also clear that this would be best done in a kissing balloon fashion particular with the proximal right common iliac lesion and making sure we do not harm the left side when treating the right.  The patient was systemically heparinized and a 5 French sheath was then placed over the Terumo Advantage wire after ultrasound was used to gain access to the right femoral artery without difficulty and a patent portion of the right femoral artery with a permanent image recorded. I then put a Magic torque wire up the left and remove the diagnostic catheter.  I then treated both common iliac arteries in a kissing balloon fashion with an 8 mm x 6 cm length on the right and an 8 mm x 4 cm length balloon on the left.  They were both inflated to 10 atm.  The right external iliac lesion was then treated with a 7 mm diameter by 6 cm likely tonics drug-coated angioplasty balloon inflated up to 12 atm for 1 minute.  Pigtail catheter was replaced and completion imaging was performed.  This demonstrated a marked improvement with less than 10% residual stenosis in both common iliac arteries and a less than 10% residual stenosis in the right external iliac artery. The sheath was removed and StarClose closure device was deployed in the right femoral artery with excellent hemostatic result.  Similarly, StarClose closure device was deployed on the left femoral artery with excellent hemostatic result.  The patient was taken to the recovery room in stable condition having tolerated the procedure well.  Findings:               Aortogram:  This demonstrated normal renal arteries and the aorta was reasonably normal until the bifurcation.  There was mild to moderate stenosis of the left common iliac artery stent that had been previously placed.  The right common iliac artery stent had  moderate stenosis in the 60% range that looked quite irregular and was worrisome for a possible embolic lesion.  The stents then normalized down to the bottom of the second stent which demonstrated hyperplastic stenosis in the 60 to 70% range.              Right lower Extremity: Relatively normal common femoral artery, profunda femoris artery, SFA and popliteal artery down to the knee with large geniculate collaterals into the stump.   Disposition: Patient was taken to the recovery room in stable condition having tolerated the procedure well.  Complications: None  Leotis Pain 08/28/2019 11:42 AM   This note was created with Dragon Medical transcription system. Any errors in dictation are purely unintentional.

## 2019-08-28 NOTE — Discharge Instructions (Signed)
Be sure to call the wound care center at [facility] and ask them to reconsider the current treatment plan per the request of Dr. Lucky Cowboy                                       Angiogram, Care After This sheet gives you information about how to care for yourself after your procedure. Your doctor may also give you more specific instructions. If you have problems or questions, contact your doctor. Follow these instructions at home: Insertion site care  Follow instructions from your doctor about how to take care of your long, thin tube (catheter) insertion area. Make sure you: ? Wash your hands with soap and water before you change your bandage (dressing). If you cannot use soap and water, use hand sanitizer. ? Change your bandage as told by your doctor. ? Leave stitches (sutures), skin glue, or skin tape (adhesive) strips in place. They may need to stay in place for 2 weeks or longer. If tape strips get loose and curl up, you may trim the loose edges. Do not remove tape strips completely unless your doctor says it is okay.  Do not take baths, swim, or use a hot tub until your doctor says it is okay.  You may shower 24-48 hours after the procedure or as told by your doctor. ? Gently wash the area with plain soap and water. ? Pat the area dry with a clean towel. ? Do not rub the area. This may cause bleeding.  Do not apply powder or lotion to the area. Keep the area clean and dry.  Check your insertion area every day for signs of infection. Check for: ? More redness, swelling, or pain. ? Fluid or blood. ? Warmth. ? Pus or a bad smell. Activity  Rest as told by your doctor, usually for 1-2 days.  Do not lift anything that is heavier than 10 lbs. (4.5 kg) or as told by your doctor.  Do not drive for 24 hours if you were given a medicine to help you relax (sedative).  Do not drive or use heavy machinery while taking prescription pain medicine. General instructions   Go back to your normal  activities as told by your doctor, usually in about a week. Ask your doctor what activities are safe for you.  If the insertion area starts to bleed, lie flat and put pressure on the area. If the bleeding does not stop, get help right away. This is an emergency.  Drink enough fluid to keep your pee (urine) clear or pale yellow.  Take over-the-counter and prescription medicines only as told by your doctor.  Keep all follow-up visits as told by your doctor. This is important. Contact a doctor if:  You have a fever.  You have chills.  You have more redness, swelling, or pain around your insertion area.  You have fluid or blood coming from your insertion area.  The insertion area feels warm to the touch.  You have pus or a bad smell coming from your insertion area.  You have more bruising around the insertion area.  Blood collects in the tissue around the insertion area (hematoma) that may be painful to the touch. Get help right away if:  You have a lot of pain in the insertion area.  The insertion area swells very fast.  The insertion area is bleeding, and the bleeding does  not stop after holding steady pressure on the area.  The area near or just beyond the insertion area becomes pale, cool, tingly, or numb. These symptoms may be an emergency. Do not wait to see if the symptoms will go away. Get medical help right away. Call your local emergency services (911 in the [place] Do not drive yourself to the hospital. Summary  After the procedure, it is common to have bruising and tenderness at the long, thin tube insertion area.  After the procedure, it is important to rest and drink plenty of fluids.  Do not take baths, swim, or use a hot tub until your doctor says it is okay to do so. You may shower 24-48 hours after the procedure or as told by your doctor.  If the insertion area starts to bleed, lie flat and put pressure on the area. If the bleeding does not stop, get help  right away. This is an emergency. This information is not intended to replace advice given to you by your health care provider. Make sure you discuss any questions you have with your health care provider. Document Revised: 07/02/2017 Document Reviewed: 07/14/2016 Elsevier Patient Education  [place].   Femoral Site Care This sheet gives you information about how to care for yourself after your procedure. Your health care provider may also give you more specific instructions. If you have problems or questions, contact your health care provider. What can I expect after the procedure? After the procedure, it is common to have:  Bruising that usually fades within 1-2 weeks.  Tenderness at the site. Follow these instructions at home: Wound care  Follow instructions from your health care provider about how to take care of your insertion site. Make sure you: ? Wash your hands with soap and water before you change your bandage (dressing). If soap and water are not available, use hand sanitizer. ? Change your dressing as told by your health care provider. ? Leave stitches (sutures), skin glue, or adhesive strips in place. These skin closures may need to stay in place for 2 weeks or longer. If adhesive strip edges start to loosen and curl up, you may trim the loose edges. Do not remove adhesive strips completely unless your health care provider tells you to do that.  Do not take baths, swim, or use a hot tub until your health care provider approves.  You may shower 24-48 hours after the procedure or as told by your health care provider. ? Gently wash the site with plain soap and water. ? Pat the area dry with a clean towel. ? Do not rub the site. This may cause bleeding.  Do not apply powder or lotion to the site. Keep the site clean and dry.  Check your femoral site every day for signs of infection. Check for: ? Redness, swelling, or pain. ? Fluid or blood. ? Warmth. ? Pus or a  bad smell. Activity  For the first 2-3 days after your procedure, or as long as directed: ? Avoid climbing stairs as much as possible. ? Do not squat.  Do not lift anything that is heavier than 10 lb (4.5 kg), or the limit that you are told, until your health care provider says that it is safe.  Rest as directed. ? Avoid sitting for a long time without moving. Get up to take short walks every 1-2 hours.  Do not drive for 24 hours if you were given a medicine to help you relax (sedative).  General instructions  Take over-the-counter and prescription medicines only as told by your health care provider.  Keep all follow-up visits as told by your health care provider. This is important. Contact a health care provider if you have:  A fever or chills.  You have redness, swelling, or pain around your insertion site. Get help right away if:  The catheter insertion area swells very fast.  You pass out.  You suddenly start to sweat or your skin gets clammy.  The catheter insertion area is bleeding, and the bleeding does not stop when you hold steady pressure on the area.  The area near or just beyond the catheter insertion site becomes pale, cool, tingly, or numb. These symptoms may represent a serious problem that is an emergency. Do not wait to see if the symptoms will go away. Get medical help right away. Call your local emergency services (911 in the [place] Do not drive yourself to the hospital. Summary  After the procedure, it is common to have bruising that usually fades within 1-2 weeks.  Check your femoral site every day for signs of infection.  Do not lift anything that is heavier than 10 lb (4.5 kg), or the limit that you are told, until your health care provider says that it is safe. This information is not intended to replace advice given to you by your health care provider. Make sure you discuss any questions you have with your health care provider. Document Revised:  08/02/2017 Document Reviewed: 08/02/2017 Elsevier Patient Education  [place].   Moderate Conscious Sedation, Adult, Care After These instructions provide you with information about caring for yourself after your procedure. Your health care provider may also give you more specific instructions. Your treatment has been planned according to current medical practices, but problems sometimes occur. Call your health care provider if you have any problems or questions after your procedure. What can I expect after the procedure? After your procedure, it is common:  To feel sleepy for several hours.  To feel clumsy and have poor balance for several hours.  To have poor judgment for several hours.  To vomit if you eat too soon. Follow these instructions at home: For at least 24 hours after the procedure:   Do not: ? Participate in activities where you could fall or become injured. ? Drive. ? Use heavy machinery. ? Drink alcohol. ? Take sleeping pills or medicines that cause drowsiness. ? Make important decisions or sign legal documents. ? Take care of children on your own.  Rest. Eating and drinking  Follow the diet recommended by your health care provider.  If you vomit: ? Drink water, juice, or soup when you can drink without vomiting. ? Make sure you have little or no nausea before eating solid foods. General instructions  Have a responsible adult stay with you until you are awake and alert.  Take over-the-counter and prescription medicines only as told by your health care provider.  If you smoke, do not smoke without supervision.  Keep all follow-up visits as told by your health care provider. This is important. Contact a health care provider if:  You keep feeling nauseous or you keep vomiting.  You feel light-headed.  You develop a rash.  You have a fever. Get help right away if:  You have trouble breathing. This information is not intended to replace  advice given to you by your health care provider. Make sure you discuss any questions you have with your health  care provider. Document Revised: 07/02/2017 Document Reviewed: 11/09/2015 Elsevier Patient Education  2020 [employer].

## 2019-08-28 NOTE — H&P (Signed)
[facility] History & Physical Update  The patient was interviewed and re-examined.  The patient's previous History and Physical has been reviewed and is unchanged.  There is no change in the plan of care. We plan to proceed with the scheduled procedure.  Leotis Pain, MD  08/28/2019, 9:00 AM

## 2019-08-28 NOTE — Progress Notes (Signed)
Per patient request wound to right leg amputation was changed. The dressing was noted to be soaked almost all the way through. Per the patient his wife changes his dressing every 2-3 days. Per the patient he no longer goes to the wound care center and "a doctor manages it." Upon reviewing the chart the last wound care note is from August. Wet to dry dressing was applied to the area, will discuss wound care plan with Dr. Lucky Cowboy prior to patient discharge.  Mattie Marlin, RN  08/28/2019

## 2019-08-29 ENCOUNTER — Telehealth (INDEPENDENT_AMBULATORY_CARE_PROVIDER_SITE_OTHER): Payer: Self-pay

## 2019-08-29 NOTE — Telephone Encounter (Signed)
Patient wife left a voicemail stating that her husband had a angio on yesterday and was suggested to use a different dressing except medihoney due to keeping the wound moist. The patient wife spoke with Clyde Canterbury from [facility] wound center and she would like for the patient to continue using the medihoney bandage and stated that he should have a debridement. The patient wife is concern if there is to much moisture on the wound which dressing could be used in between time before debridement. I spoke with Dr Lucky Cowboy and he recommend in vascular opinion the patient should continue with wound care recommendation and also the patient will be schedule for a debridement. Patient wife has been made aware with medical advice and verbalized understanding

## 2019-09-04 DIAGNOSIS — Z89511 Acquired absence of right leg below knee: Secondary | ICD-10-CM | POA: Diagnosis not present

## 2019-09-04 DIAGNOSIS — I96 Gangrene, not elsewhere classified: Secondary | ICD-10-CM | POA: Diagnosis not present

## 2019-09-05 ENCOUNTER — Ambulatory Visit (INDEPENDENT_AMBULATORY_CARE_PROVIDER_SITE_OTHER): Payer: Medicare HMO | Admitting: Vascular Surgery

## 2019-09-06 DIAGNOSIS — I96 Gangrene, not elsewhere classified: Secondary | ICD-10-CM | POA: Insufficient documentation

## 2019-09-06 HISTORY — DX: Gangrene, not elsewhere classified: I96

## 2019-09-14 DIAGNOSIS — R600 Localized edema: Secondary | ICD-10-CM | POA: Diagnosis not present

## 2019-09-14 DIAGNOSIS — T8789 Other complications of amputation stump: Secondary | ICD-10-CM | POA: Diagnosis not present

## 2019-09-14 DIAGNOSIS — E1151 Type 2 diabetes mellitus with diabetic peripheral angiopathy without gangrene: Secondary | ICD-10-CM | POA: Diagnosis not present

## 2019-09-14 DIAGNOSIS — X58XXXD Exposure to other specified factors, subsequent encounter: Secondary | ICD-10-CM | POA: Diagnosis not present

## 2019-09-14 DIAGNOSIS — I10 Essential (primary) hypertension: Secondary | ICD-10-CM | POA: Diagnosis not present

## 2019-09-14 DIAGNOSIS — T8189XA Other complications of procedures, not elsewhere classified, initial encounter: Secondary | ICD-10-CM | POA: Diagnosis not present

## 2019-09-15 ENCOUNTER — Other Ambulatory Visit: Payer: Self-pay | Admitting: Cardiovascular Disease

## 2019-09-19 DIAGNOSIS — Z89511 Acquired absence of right leg below knee: Secondary | ICD-10-CM | POA: Diagnosis not present

## 2019-09-19 DIAGNOSIS — I96 Gangrene, not elsewhere classified: Secondary | ICD-10-CM | POA: Diagnosis not present

## 2019-09-20 ENCOUNTER — Ambulatory Visit: Payer: Medicare HMO

## 2019-09-25 ENCOUNTER — Other Ambulatory Visit (INDEPENDENT_AMBULATORY_CARE_PROVIDER_SITE_OTHER): Payer: Self-pay | Admitting: Vascular Surgery

## 2019-09-25 DIAGNOSIS — Z95820 Peripheral vascular angioplasty status with implants and grafts: Secondary | ICD-10-CM

## 2019-09-25 DIAGNOSIS — I739 Peripheral vascular disease, unspecified: Secondary | ICD-10-CM

## 2019-09-26 ENCOUNTER — Encounter (INDEPENDENT_AMBULATORY_CARE_PROVIDER_SITE_OTHER): Payer: Medicare HMO

## 2019-09-26 ENCOUNTER — Ambulatory Visit (INDEPENDENT_AMBULATORY_CARE_PROVIDER_SITE_OTHER): Payer: Medicare HMO | Admitting: Nurse Practitioner

## 2019-09-27 ENCOUNTER — Encounter (INDEPENDENT_AMBULATORY_CARE_PROVIDER_SITE_OTHER): Payer: Self-pay | Admitting: Nurse Practitioner

## 2019-09-27 ENCOUNTER — Ambulatory Visit (INDEPENDENT_AMBULATORY_CARE_PROVIDER_SITE_OTHER): Payer: Medicare HMO

## 2019-09-27 ENCOUNTER — Other Ambulatory Visit: Payer: Self-pay

## 2019-09-27 ENCOUNTER — Ambulatory Visit (INDEPENDENT_AMBULATORY_CARE_PROVIDER_SITE_OTHER): Payer: Medicare HMO | Admitting: Nurse Practitioner

## 2019-09-27 VITALS — BP 128/75 | HR 91 | Resp 16

## 2019-09-27 DIAGNOSIS — I739 Peripheral vascular disease, unspecified: Secondary | ICD-10-CM

## 2019-09-27 DIAGNOSIS — E1151 Type 2 diabetes mellitus with diabetic peripheral angiopathy without gangrene: Secondary | ICD-10-CM | POA: Diagnosis not present

## 2019-09-27 DIAGNOSIS — T8740 Infection of amputation stump, unspecified extremity: Secondary | ICD-10-CM

## 2019-09-27 DIAGNOSIS — I70209 Unspecified atherosclerosis of native arteries of extremities, unspecified extremity: Secondary | ICD-10-CM | POA: Diagnosis not present

## 2019-09-27 DIAGNOSIS — Z95820 Peripheral vascular angioplasty status with implants and grafts: Secondary | ICD-10-CM | POA: Diagnosis not present

## 2019-09-27 MED ORDER — OXYCODONE-ACETAMINOPHEN 5-325 MG PO TABS: 1.0000 | ORAL_TABLET | Freq: Four times a day (QID) | ORAL | 0 refills | Status: DC | PRN

## 2019-09-27 NOTE — Progress Notes (Signed)
SUBJECTIVE:  Patient ID: Glenn Gerold., male    DOB: 11-02-1955, 64 y.o.   MRN: [id number] Chief Complaint  Patient presents with  . Follow-up    [facility] 4wk rle angio with ultrasound    HPI  [name] [name] is a 64 y.o. male presents today for follow-up after angiogram of his right lower extremity and below-knee amputation.  The patient originally had his amputation done on 06/02/2018 and has continued to have issues with wound healing since then.  Originally, we provided wound care in our office including debridement with wound VAC placement, skin grafts as well as topical wound care.  The patient was then referred to the [place] [facility] he was treated for months there without healing.  Finally, the patient was also treated at [facility] wound care center the patient has still continued not heal at this time.  Prior to the patient's angiogram he had developed a large necrotic eschar over more than half of his below-knee amputation site.  There was a foul odor with some dark drainage as well.  There are also skin changes overlying the patella that were not traumatic in nature.  Today, the large necrotic eschar still present as well as the skin changes but there is no foul-smelling drainage.  The patient does continue to have significant pain to his stump however.  Today noninvasive studies show that the patient has triphasic waveforms within the bilateral iliac arteries.  With the largest aortic measurement at 2.3 cm.  Past Medical History:  Diagnosis Date  . Acute left PCA stroke ([place]) 02/17/2015  . Anemia   . Atherosclerosis of native arteries of the extremities with gangrene ([place]) 05/08/2018  . Atrial fibrillation ([place])   . Blind    right eye  . Diabetes mellitus with complication ([place])   . Dilated aortic root ([place])    a. 04/2018 Echo: 4.1cm. Asc Ao 3.5cm.  Marland Kitchen Dysrhythmia    a fib  . Gangrene of right foot ([place]) 06/02/2018  . GERD (gastroesophageal reflux disease)    . History of echocardiogram    a. 04/2018 Echo: Ef 60-65%, no rwma, midly to mod dil Ao root - 4.1cm. Asc Ao 3.5cm. Mild MR. Nl RV fxn. Nl PASP.  Marland Kitchen History of hernia repair   . History of stress test    a. 2016 MV ([name]): EF 58%, no ischemia.  . Hyperlipidemia   . Hypertension   . Leg pain   . Legally blind   . Necrotic toes ([place]) 02/21/2018  . PAD (peripheral artery disease) ([place])   . PAF (paroxysmal atrial fibrillation) ([name])    a. on Eliquis as of 2018; b. CHADS2VASc => 5 (HTN, DM, stroke x 2, vascular disease)  . Peripheral vascular disease ([place])    a. followed by Dr. Lucky Cowboy; b. s/p kissing balloon stents and right external iliac stent in 10/2017; c. LE angiogrpahy 01/2018: No significant arterial occlusive disease in the lower extremities.  . Pulmonary nodules   . Stroke [facility])    a. 2016 & 2018  . Stroke [facility]) 07/11/2018    Past Surgical History:  Procedure Laterality Date  . AMPUTATION Right 06/02/2018   Procedure: AMPUTATION BELOW KNEE;  Surgeon: [name], MD;  Location: [facility] ORS;  Service: Vascular;  Laterality: Right;  . APPENDECTOMY    . COLONOSCOPY WITH PROPOFOL N/A 05/12/2018   Procedure: COLONOSCOPY WITH PROPOFOL;  Surgeon: [name], MD;  Location: [facility] ENDOSCOPY;  Service: Gastroenterology;  Laterality:  N/A;  . ESOPHAGOGASTRODUODENOSCOPY (EGD) WITH PROPOFOL N/A 05/12/2018   Procedure: ESOPHAGOGASTRODUODENOSCOPY (EGD) WITH PROPOFOL;  Surgeon: [name], MD;  Location: [facility] ENDOSCOPY;  Service: Gastroenterology;  Laterality: N/A;  . GIVENS CAPSULE STUDY N/A 07/13/2018   Procedure: GIVENS CAPSULE STUDY;  Surgeon: [name], MD;  Location: [facility] ENDOSCOPY;  Service: Gastroenterology;  Laterality: N/A;  . HERNIA REPAIR     UMBILICAL  . LOWER EXTREMITY ANGIOGRAPHY Right 10/25/2017   Procedure: LOWER EXTREMITY ANGIOGRAPHY;  Surgeon: [name], MD;  Location: [place] CV LAB;  Service: Cardiovascular;  Laterality: Right;  . LOWER EXTREMITY ANGIOGRAPHY Right  01/13/2018   Procedure: LOWER EXTREMITY ANGIOGRAPHY;  Surgeon: [name], MD;  Location: [place] CV LAB;  Service: Cardiovascular;  Laterality: Right;  . LOWER EXTREMITY ANGIOGRAPHY Right 08/28/2019   Procedure: LOWER EXTREMITY ANGIOGRAPHY;  Surgeon: [name], MD;  Location: [place] CV LAB;  Service: Cardiovascular;  Laterality: Right;  . LOWER EXTREMITY INTERVENTION  10/25/2017   Procedure: LOWER EXTREMITY INTERVENTION;  Surgeon: [name], MD;  Location: [place] CV LAB;  Service: Cardiovascular;;  . SKIN SPLIT GRAFT Right 12/28/2018   Procedure: SKIN GRAFT SPLIT THICKNESS ( SYNTHETIC );  Surgeon: [name], MD;  Location: [facility] ORS;  Service: Vascular;  Laterality: Right;  . TONSILLECTOMY    . WOUND DEBRIDEMENT Right 08/08/2018   Procedure: DEBRIDEMENT WOUND WITH WOUND VAC APPLICATION;  Surgeon: [name], MD;  Location: [facility] ORS;  Service: Vascular;  Laterality: Right;    Social History   Socioeconomic History  . Marital status: Married    Spouse name: Not on file  . Number of children: 0  . Years of education: Not on file  . Highest education level: Associate degree: academic program  Occupational History  . Not on file  Tobacco Use  . Smoking status: Former Smoker    Packs/day: 1.00    Years: 30.00    Pack years: 30.00    Types: Cigarettes    Quit date: 01/09/2018    Years since quitting: 1.7  . Smokeless tobacco: Never Used  Substance and Sexual Activity  . Alcohol use: Never    Alcohol/week: 0.0 standard drinks    Comment: occasionally beer   . Drug use: No  . Sexual activity: Yes  Other Topics Concern  . Not on file  Social History Narrative  . Not on file   Social Determinants of Health   Financial Resource Strain:   . Difficulty of Paying Living Expenses: Not on file  Food Insecurity:   . Worried About Charity fundraiser in the Last Year: Not on file  . Ran Out of Food in the Last Year: Not on file  Transportation Needs:   . Lack  of Transportation (Medical): Not on file  . Lack of Transportation (Non-Medical): Not on file  Physical Activity:   . Days of Exercise per Week: Not on file  . Minutes of Exercise per Session: Not on file  Stress:   . Feeling of Stress : Not on file  Social Connections:   . Frequency of Communication with Friends and Family: Not on file  . Frequency of Social Gatherings with Friends and Family: Not on file  . Attends Religious Services: Not on file  . Active Member of Clubs or Organizations: Not on file  . Attends Archivist Meetings: Not on file  . Marital Status: Not on file  Intimate Partner Violence:   . Fear of  Current or Ex-Partner: Not on file  . Emotionally Abused: Not on file  . Physically Abused: Not on file  . Sexually Abused: Not on file    Family History  Problem Relation Age of Onset  . Diabetes Father   . Hypertension Father   . Hyperlipidemia Father     Allergies  Allergen Reactions  . Sodium Pentobarbital [Pentobarbital] Shortness Of Breath  . Lipitor [Atorvastatin] Rash     Review of Systems   Review of Systems: Negative Unless Checked Constitutional: [] Weight loss  [] Fever  [] Chills Cardiac: [] Chest pain   []  Atrial Fibrillation  [] Palpitations   [] Shortness of breath when laying flat   [] Shortness of breath with exertion. [] Shortness of breath at rest Vascular:  [] Pain in legs with walking   [] Pain in legs with standing [] Pain in legs when laying flat   [] Claudication    [] Pain in feet when laying flat    [] History of DVT   [] Phlebitis   [] Swelling in legs   [] Varicose veins   [] Non-healing ulcers Pulmonary:   [] Uses home oxygen   [] Productive cough   [] Hemoptysis   [] Wheeze  [] COPD   [] Asthma Neurologic:  [] Dizziness   [] Seizures  [] Blackouts [] History of stroke   [] History of TIA  [] Aphasia   [] Temporary Blindness   [] Weakness or numbness in arm   [x] Weakness or numbness in leg Musculoskeletal:   [] Joint swelling   [] Joint pain   [] Low back  pain  []  History of Knee Replacement [] Arthritis [] back Surgeries  []  Spinal Stenosis    Hematologic:  [] Easy bruising  [] Easy bleeding   [] Hypercoagulable state   [] Anemic Gastrointestinal:  [] Diarrhea   [] Vomiting  [] Gastroesophageal reflux/heartburn   [] Difficulty swallowing. [] Abdominal pain Genitourinary:  [] Chronic kidney disease   [] Difficult urination  [] Anuric   [] Blood in urine [] Frequent urination  [] Burning with urination   [] Hematuria Skin:  [] Rashes   [] Ulcers [x] Wounds Psychological:  [x] History of anxiety   [x]  History of major depression  []  Memory Difficulties      OBJECTIVE:   Physical Exam  BP 128/75 (BP Location: Right Arm)   Pulse 91   Resp 16   Gen: WD/WN, NAD Head: [place]/AT, No temporalis wasting.  Ear/Nose/Throat: Hearing grossly intact, nares w/o erythema or drainage Eyes: PER, EOMI, sclera nonicteric.  Neck: Supple, no masses.  No JVD.  Pulmonary:  Good air movement, no use of accessory muscles.  Cardiac: RRR Vascular:  Right below-knee amputation with eschar over approximately half.  Nontraumatic color changes to the knee Vessel Right Left  Radial Palpable Palpable   Gastrointestinal: soft, non-distended. No guarding/no peritoneal signs.  Musculoskeletal: M/S 5/5 throughout.  No deformity or atrophy.  Neurologic: Pain and light touch intact in extremities.  Symmetrical.  Speech is fluent. Motor exam as listed above. Psychiatric: Pressured speech, consistently repeats himself.  Judgment intact mostly.  Patient has wife at side as well.        ASSESSMENT AND PLAN:  1. Amputation stump infection ([place]) I had a long discussion with the patient and his wife regarding his below-knee amputation.  The patient originally underwent amputation on 06/02/2018.  Since that time we have been progressively trying to get his wound to heal without success.  The patient has had wound treatment by our office, the [place] [facility], in addition to wound care  through [facility].  He has been through multiple debridements, skin grafts and a multitude of treatments by the wound centers.  Despite all of our  best efforts the wound continues to not heal.  Based on this and the fact that this has been ongoing for approximately 16 months it is unlikely that this wound will heal.  Also, the color changes at the knee or concerning for worsening perfusion.  Based on this I have recommended that the patient undergo revision of his below-knee amputation to an above-knee amputation.  Typically, above-knee amputation sites do heal faster and better than below-knee amputations.  Also, given the fact that this has not healed in 16 months, revision to an above-knee amputation would likely be in the patient's best interest instead of continuing to prolong treatment with different wound care options.  Discussed this with patient and wife and that the patient could still utilize a prosthesis however it would be a little bit more difficult.  The patient understands it at this time he agrees to proceed with revision of his right below-knee amputation to an above-knee amputation.  All risk, benefits and alternatives were discussed.  Patient will follow up in office after procedure. - oxyCODONE-acetaminophen (PERCOCET/ROXICET) 5-325 MG tablet; Take 1 tablet by mouth every 6 (six) hours as needed for severe pain.  Dispense: 40 tablet; Refill: 0  2. PVD (peripheral vascular disease) ([place]) Noninvasive studies show today that recent revascularization should assist with healing above-knee stump revision.  3. 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  Most recent A1c was 6.8 in November.  This should be adequate for progressive wound healing.   Current Outpatient Medications on File Prior to Visit  Medication Sig Dispense  Refill  . acetaminophen (TYLENOL) 500 MG tablet Take 1,000 mg by mouth every 6 (six) hours as needed (for pain.).    Marland Kitchen apixaban (ELIQUIS) 5 MG TABS tablet Take 1 tablet (5 mg total) by mouth 2 (two) times daily. 180 tablet 1  . aspirin 81 MG EC tablet TAKE 1 TABLET BY MOUTH EVERY DAY 90 tablet 4  . cholecalciferol (VITAMIN D3) 25 MCG (1000 UT) tablet Take 5,000 Units by mouth daily.     Marland Kitchen docusate sodium (COLACE) 100 MG capsule Take 1 capsule (100 mg total) by mouth daily. Do not take if you have loose stools 90 capsule 2  . Elastic Bandages & Supports (MEDIGRIP) MISC Apply Tubigrip G to stump through the knee, double layer if tolerated    . empagliflozin (JARDIANCE) 10 MG TABS tablet Take 5 mg by mouth daily. 90 tablet 1  . ezetimibe-simvastatin (VYTORIN) 10-40 MG tablet TAKE 1 TABLET BY MOUTH DAILY AT 6 PM. 90 tablet 0  . ferrous sulfate (FERROUSUL) 325 (65 FE) MG tablet Take 1 tablet (325 mg total) by mouth 2 (two) times daily with a meal. 120 tablet 3  . gabapentin (NEURONTIN) 800 MG tablet Take 1 tablet (800 mg total) by mouth 3 (three) times daily. 270 tablet 1  . Gauze Pads & Dressings (KERLIX BANDAGE ROLL) MISC Apply topically.    . hydrochlorothiazide (MICROZIDE) 12.5 MG capsule Take 1 capsule (12.5 mg total) by mouth daily. 45 capsule 1  . lansoprazole (PREVACID) 30 MG capsule Take 30 mg by mouth daily at 12 noon.    . metFORMIN (GLUCOPHAGE) 1000 MG tablet Take 1 tablet (1,000 mg total) by mouth 2 (two) times daily with a meal. 180 tablet 3  . metoprolol tartrate (LOPRESSOR) 25 MG tablet Take 1 tablet (25 mg  total) by mouth 2 (two) times daily. 180 tablet 3  . Multiple Vitamin (MULTIVITAMIN WITH MINERALS) TABS tablet Take 1 tablet by mouth daily. 30 tablet 0  . ONE TOUCH ULTRA TEST test strip USE TO TEST BLOOD SUGAR BID 100 each 12  . ONETOUCH DELICA LANCETS 99991111 MISC USE AS DIRECTED TO TEST BLOOD SUGAR BID 100 each 12  . vitamin C (ASCORBIC ACID) 500 MG tablet Take 500 mg by mouth daily.     . Wound Dressings (MEDIHONEY CA ALGINATE 4"X5") PADS Apply topically.    . Calcium Carb-Cholecalciferol (CALCIUM 600-D PO) Take by mouth daily.    Marland Kitchen donepezil (ARICEPT) 5 MG tablet Take 5 mg by mouth at bedtime.     No current facility-administered medications on file prior to visit.    There are no Patient Instructions on file for this visit. No follow-ups on file.   Kris Hartmann, NP  This note was completed with Sales executive.  Any errors are purely unintentional.

## 2019-09-28 ENCOUNTER — Encounter (INDEPENDENT_AMBULATORY_CARE_PROVIDER_SITE_OTHER): Payer: Self-pay

## 2019-09-29 ENCOUNTER — Other Ambulatory Visit: Payer: Self-pay | Admitting: Family Medicine

## 2019-09-29 NOTE — Telephone Encounter (Signed)
Requested Prescriptions  Pending Prescriptions Disp Refills  . hydrochlorothiazide (MICROZIDE) 12.5 MG capsule [Pharmacy Med Name: HYDROCHLOROTHIAZIDE 12.5 MG CP] 90 capsule 1    Sig: TAKE 1 CAPSULE BY MOUTH EVERY DAY     Cardiovascular: Diuretics - Thiazide Failed - 09/29/2019 12:56 AM      Failed - Cr in normal range and within 360 days    Creatinine, Ser  Date Value Ref Range Status  08/28/2019 0.58 (L) 0.61 - 1.24 mg/dL Final         Passed - Ca in normal range and within 360 days    Calcium  Date Value Ref Range Status  07/03/2019 10.0 8.6 - 10.2 mg/dL Final         Passed - K in normal range and within 360 days    Potassium  Date Value Ref Range Status  07/03/2019 4.1 3.5 - 5.2 mmol/L Final         Passed - Na in normal range and within 360 days    Sodium  Date Value Ref Range Status  07/03/2019 142 134 - 144 mmol/L Final         Passed - Last BP in normal range    BP Readings from Last 1 Encounters:  09/27/19 128/75         Passed - Valid encounter within last 6 months    Recent Outpatient Visits          2 months ago Routine general medical examination at a health care facility   [facility] [place] P, DO   9 months ago 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)   [place], Barb Merino, DO      Future Appointments            In 4 days Gollan, Kathlene November, MD [facility]   In 2 weeks  [employer], [place]   In 2 weeks [place], Barb Merino, DO [employer], [place]   In 1 month Earlie Server, MD [facility]

## 2019-10-02 ENCOUNTER — Ambulatory Visit: Payer: Medicare HMO | Admitting: Family Medicine

## 2019-10-02 NOTE — Progress Notes (Deleted)
Cardiology Office Note  Date:  10/02/2019   ID:  [name]., DOB [birth year], MRN [id number]  PCP:  Glenn Roys, DO   No chief complaint on file.   HPI:  [name] is a 64 y.o.male patient who has a history of  Stroke in 2016, with right homonymous hemianopsia  cortical infarct in left precentral gyrus and left medial parietal lobe Recurrent stroke  November 2018 Paroxysmal atrial fibrillation PAD, Followed by Dr. 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 He feels well overall.  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 He is in a wheelchair due to his leg amputation   He will be getting a prosthesis soon. Normally, he can tell whenever he goes into Afib, however, he is currently in Afib, but does not feel it at this time  Whenever he checks his blood pressure at home it is pretty low  Denies significant leg swelling  He is still taking aspirin and Eliquis. He has gained weight recently and has gone up to weight he was before his leg amputation.  No regular exercise   Still going to get his iron infusion, but he wants to stop doing them.   He is compliant with his Vytorin which has kept his cholesterol low.   He quit smoking 6 months ago. Also quit drinking recently, but drinks a beer every once in a while.  His diet is low in sugars and has changed to eating wheat bread.   He does have a sore on his right leg stump, several centimeters Waiting for this to heal before he gets a prosthesis  Blood pressure 120/66 Total Chol 116/ LDL 41 HBA1C 6.9 CR 0.77 Glucose 126  EKG personally reviewed by myself on  todays visit Shows Atrial fibrillation rhythm. 87 bpm. Consider septal infarct, age undetermined. Abnormal ECG.   OTHER PAST MEDICAL HISTORY REVIEWED BY ME FOR TODAY'S VISIT: Stress test performed showing ejection fraction 50% no ischemia Echocardiogram August 2016 essentially normal with mild MR ejection fraction 55-60% Holter but results are unavailable Carotid ultrasound 2016 with mild plaque  Repeat testing November 2018 Echocardiogram with normal ejection fraction 55-65% Carotid ultrasound less than 50% bilaterally  CT scan runoff/aorta November 2018 1. 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 2. Tapered appearance of the right peroneal artery as it approaches the ankle without abrupt occlusion. Patent right dorsalis pedis. 3. Normal left three-vessel runoff to the level of the ankle. 4. 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 5.  Aortic Atherosclerosis   DM HBA1C 6.6  Total chol 151 LDL 67  EKG Jun 21 2017: atrial fib   PMH:   has a past medical history of Acute left PCA stroke ([place]) (02/17/2015), Anemia, Atherosclerosis of native arteries of the extremities with gangrene ([place]) (05/08/2018), Atrial fibrillation ([place]), Blind, Diabetes mellitus with complication ([place]), Dilated aortic root ([place]), Dysrhythmia, Gangrene of right foot ([place]) (06/02/2018), GERD (gastroesophageal reflux disease), History of echocardiogram, History of hernia repair, History  of stress test, Hyperlipidemia, Hypertension, Leg pain, Legally blind, Necrotic toes ([place]) (02/21/2018), PAD (peripheral artery disease) ([place]), PAF (paroxysmal atrial fibrillation) ([place]), Peripheral vascular disease ([place]), Pulmonary nodules, Stroke ([place]), and Stroke ([place]) (07/11/2018).  PSH:    Past Surgical History:  Procedure  Laterality Date  . AMPUTATION Right 06/02/2018   Procedure: AMPUTATION BELOW KNEE;  Surgeon: [name], MD;  Location: [facility] ORS;  Service: Vascular;  Laterality: Right;  . APPENDECTOMY    . COLONOSCOPY WITH PROPOFOL N/A 05/12/2018   Procedure: COLONOSCOPY WITH PROPOFOL;  Surgeon: [name], MD;  Location[facility]COPY;  Service: Gastroenterology;  Laterality: N/A;  . ESOPHAGOGASTRODUODENOSCOPY (EGD) WITH PROPOFOL N/A 05/12/2018   Procedure: ESOPHAGOGASTRODUODENOSCOPY (EGD) WITH PROPOFOL;  Surgeon: [name], MD;  Location: [facility] ENDOSCOPY;  Service: Gastroenterology;  Laterality: N/A;  . GIVENS CAPSULE STUDY N/A 07/13/2018   Procedure: GIVENS CAPSULE STUDY;  Surgeon: [name], MD;  Location: [facility] ENDOSCOPY;  Service: Gastroenterology;  Laterality: N/A;  . HERNIA REPAIR     UMBILICAL  . LOWER EXTREMITY ANGIOGRAPHY Right 10/25/2017   Procedure: LOWER EXTREMITY ANGIOGRAPHY;  Surgeon: [name], MD;  Location: [place] [facility] LAB;  Service: Cardiovascular;  Laterality: Right;  . LOWER EXTREMITY ANGIOGRAPHY Right 01/13/2018   Procedure: LOWER EXTREMITY ANGIOGRAPHY;  Surgeon: [name], MD;  Location: [place] CV LAB;  Service: Cardiovascular;  Laterality: Right;  . LOWER EXTREMITY ANGIOGRAPHY Right 08/28/2019   Procedure: LOWER EXTREMITY ANGIOGRAPHY;  Surgeon: [name], MD;  Location: [place] [facility] LAB;  Service: Cardiovascular;  Laterality: Right;  . LOWER EXTREMITY INTERVENTION  10/25/2017   Procedure: LOWER EXTREMITY INTERVENTION;  Surgeon: [name], MD;  Location: [place] [facility] LAB;  Service: Cardiovascular;;  . SKIN SPLIT GRAFT Right 12/28/2018   Procedure: SKIN GRAFT SPLIT THICKNESS ( SYNTHETIC );  Surgeon: [name], MD;  Location: [facility] ORS;  Service: Vascular;  Laterality: Right;  . TONSILLECTOMY    . WOUND DEBRIDEMENT Right 08/08/2018   Procedure: DEBRIDEMENT WOUND WITH WOUND VAC APPLICATION;  Surgeon: [name], MD;  Location: [facility] ORS;  Service:  Vascular;  Laterality: Right;    Current Outpatient Medications  Medication Sig Dispense Refill  . acetaminophen (TYLENOL) 500 MG tablet Take 1,000 mg by mouth every 6 (six) hours as needed (for pain.).    Marland Kitchen apixaban (ELIQUIS) 5 MG TABS tablet Take 1 tablet (5 mg total) by mouth 2 (two) times daily. 180 tablet 1  . aspirin 81 MG EC tablet TAKE 1 TABLET BY MOUTH EVERY DAY 90 tablet 4  . Calcium Carb-Cholecalciferol (CALCIUM 600-D PO) Take by mouth daily.    . cholecalciferol (VITAMIN D3) 25 MCG (1000 UT) tablet Take 5,000 Units by mouth daily.     Marland Kitchen docusate sodium (COLACE) 100 MG capsule Take 1 capsule (100 mg total) by mouth daily. Do not take if you have loose stools 90 capsule 2  . donepezil (ARICEPT) 5 MG tablet Take 5 mg by mouth at bedtime.    . Elastic Bandages & Supports (MEDIGRIP) MISC Apply Tubigrip G to stump through the knee, double layer if tolerated    . empagliflozin (JARDIANCE) 10 MG TABS tablet Take 5 mg by mouth daily. 90 tablet 1  . ezetimibe-simvastatin (VYTORIN) 10-40 MG tablet TAKE 1 TABLET BY MOUTH DAILY AT 6 PM. 90 tablet 0  . ferrous sulfate (FERROUSUL) 325 (65 FE) MG tablet Take 1 tablet (325 mg total) by mouth 2 (two) times daily with a meal. 120 tablet  3  . gabapentin (NEURONTIN) 800 MG tablet Take 1 tablet (800 mg total) by mouth 3 (three) times daily. 270 tablet 1  . Gauze Pads & Dressings (KERLIX BANDAGE ROLL) MISC Apply topically.    . hydrochlorothiazide (MICROZIDE) 12.5 MG capsule TAKE 1 CAPSULE BY MOUTH EVERY DAY 90 capsule 1  . lansoprazole (PREVACID) 30 MG capsule Take 30 mg by mouth daily at 12 noon.    . metFORMIN (GLUCOPHAGE) 1000 MG tablet Take 1 tablet (1,000 mg total) by mouth 2 (two) times daily with a meal. 180 tablet 3  . metoprolol tartrate (LOPRESSOR) 25 MG tablet Take 1 tablet (25 mg total) by mouth 2 (two) times daily. 180 tablet 3  . Multiple Vitamin (MULTIVITAMIN WITH MINERALS) TABS tablet Take 1 tablet by mouth daily. 30 tablet 0  . ONE  TOUCH ULTRA TEST test strip USE TO TEST BLOOD SUGAR BID 100 each 12  . ONETOUCH DELICA LANCETS 99991111 MISC USE AS DIRECTED TO TEST BLOOD SUGAR BID 100 each 12  . oxyCODONE-acetaminophen (PERCOCET/ROXICET) 5-325 MG tablet Take 1 tablet by mouth every 6 (six) hours as needed for severe pain. 40 tablet 0  . vitamin C (ASCORBIC ACID) 500 MG tablet Take 500 mg by mouth daily.    . Wound Dressings (MEDIHONEY CA ALGINATE 4"X5") PADS Apply topically.     No current facility-administered medications for this visit.     Allergies:   Sodium pentobarbital [pentobarbital] and Lipitor [atorvastatin]   Social History:  The patient  reports that he quit smoking about 20 months ago. His smoking use included cigarettes. He has a 30.00 pack-year smoking history. He has never used smokeless tobacco. He reports that he does not drink alcohol or use drugs.   Family History:   family history includes Diabetes in his father; Hyperlipidemia in his father; Hypertension in his father.    Review of Systems: Review of Systems  Constitutional: Negative.   Eyes: Negative.   Respiratory: Negative.   Cardiovascular: Negative.   Gastrointestinal: Negative.   Genitourinary: Negative.   Musculoskeletal: Negative.   Neurological: Negative.   Psychiatric/Behavioral: Negative.   All other systems reviewed and are negative.    PHYSICAL EXAM: VS:  There were no vitals taken for this visit. , BMI There is no height or weight on file to calculate BMI.   Constitutional:  oriented to person, place, and time. No distress.  HENT:  Head: Grossly normal Eyes:  no discharge. No scleral icterus.  Neck: No JVD, no carotid bruits  Cardiovascular: Regular rate and rhythm, no murmurs appreciated  Pulmonary/Chest: Clear to auscultation bilaterally, no wheezes or rales Abdominal: Soft.  no distension.  no tenderness.  Musculoskeletal: Normal range of motion Neurological:  normal muscle tone. Coordination normal. No atrophy Skin:  Skin warm and dry Psychiatric: normal affect, pleasant     Recent Labs: 07/03/2019: ALT 20; Potassium 4.1; Sodium 142; TSH 2.170 07/12/2019: Hemoglobin 12.7; Platelets 187 08/28/2019: BUN 18; Creatinine, Ser 0.58    Lipid Panel Lab Results  Component Value Date   CHOL 110 07/03/2019   HDL 47 07/03/2019   LDLCALC 44 07/03/2019   TRIG 99 07/03/2019      Wt Readings from Last 3 Encounters:  08/28/19 190 lb (86.2 kg)  06/02/19 188 lb (85.3 kg)  03/14/19 193 lb (87.5 kg)       ASSESSMENT AND PLAN:  Paroxysmal atrial fibrillation ([name]) Plan: EKG 12-Lead On Eliquis 5 twice daily Documentation of paroxysmal atrial fibrillation, EKG November 2018 May have  contributed to stroke in 2016 In atrial fibrillation on today's visit, asymptomatic No strong indication to restore normal sinus rhythm  PVD (peripheral vascular disease) ([place]) Plan: Recent PV intervention by Dr. 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  I, Jesus Reyes am acting as a Education administrator for Ida Rogue, M.D., Ph.D.  I, Ida Rogue, M.D. Ph.D., have  reviewed the above documentation for accuracy and completeness, and I agree with the above.   Signed, Esmond Plants, M.D., Ph.D. 10/02/2019  [place], [place]

## 2019-10-03 ENCOUNTER — Telehealth (INDEPENDENT_AMBULATORY_CARE_PROVIDER_SITE_OTHER): Payer: Self-pay

## 2019-10-03 ENCOUNTER — Ambulatory Visit: Payer: Medicare HMO | Admitting: Cardiovascular Disease

## 2019-10-03 NOTE — Telephone Encounter (Signed)
Deborah Scholze Called to make [facility]  aware that due to DR. Gollen being in surgery the pt's clearance appointment has been rescheduled to Thursday with Nicola Girt NP.

## 2019-10-03 NOTE — Telephone Encounter (Signed)
[name] Called to make [name]  aware that due to [name] being in surgery the pt's clearance appointment has been rescheduled to Thursday with Glenn Girt NP.

## 2019-10-04 ENCOUNTER — Other Ambulatory Visit (INDEPENDENT_AMBULATORY_CARE_PROVIDER_SITE_OTHER): Payer: Self-pay | Admitting: Nurse Practitioner

## 2019-10-04 ENCOUNTER — Telehealth (INDEPENDENT_AMBULATORY_CARE_PROVIDER_SITE_OTHER): Payer: Self-pay

## 2019-10-04 DIAGNOSIS — T8789 Other complications of amputation stump: Secondary | ICD-10-CM | POA: Diagnosis not present

## 2019-10-04 DIAGNOSIS — Z89511 Acquired absence of right leg below knee: Secondary | ICD-10-CM | POA: Diagnosis not present

## 2019-10-04 DIAGNOSIS — T8740 Infection of amputation stump, unspecified extremity: Secondary | ICD-10-CM

## 2019-10-04 DIAGNOSIS — S98921A Partial traumatic amputation of right foot, level unspecified, initial encounter: Secondary | ICD-10-CM | POA: Diagnosis not present

## 2019-10-04 MED ORDER — OXYCODONE-ACETAMINOPHEN 5-325 MG PO TABS: 1.0000 | ORAL_TABLET | Freq: Four times a day (QID) | ORAL | 0 refills | Status: DC | PRN

## 2019-10-04 NOTE — Telephone Encounter (Signed)
Everything has been sent

## 2019-10-04 NOTE — Telephone Encounter (Signed)
Patient wife has been made aware. 

## 2019-10-05 ENCOUNTER — Ambulatory Visit (INDEPENDENT_AMBULATORY_CARE_PROVIDER_SITE_OTHER): Payer: Medicare HMO | Admitting: Nurse Practitioner

## 2019-10-05 ENCOUNTER — Encounter: Payer: Self-pay | Admitting: Nurse Practitioner

## 2019-10-05 ENCOUNTER — Other Ambulatory Visit: Payer: Self-pay

## 2019-10-05 VITALS — BP 110/68 | HR 80 | Ht 70.0 in | Wt 190.0 lb

## 2019-10-05 DIAGNOSIS — I4821 Permanent atrial fibrillation: Secondary | ICD-10-CM

## 2019-10-05 DIAGNOSIS — I739 Peripheral vascular disease, unspecified: Secondary | ICD-10-CM

## 2019-10-05 DIAGNOSIS — I10 Essential (primary) hypertension: Secondary | ICD-10-CM

## 2019-10-05 DIAGNOSIS — E782 Mixed hyperlipidemia: Secondary | ICD-10-CM

## 2019-10-05 DIAGNOSIS — Z01810 Encounter for preprocedural cardiovascular examination: Secondary | ICD-10-CM | POA: Diagnosis not present

## 2019-10-05 DIAGNOSIS — Z01818 Encounter for other preprocedural examination: Secondary | ICD-10-CM

## 2019-10-05 DIAGNOSIS — I480 Paroxysmal atrial fibrillation: Secondary | ICD-10-CM

## 2019-10-05 NOTE — Patient Instructions (Addendum)
Medication Instructions:  Your physician recommends that you continue on your current medications as directed. Please refer to the Current Medication list given to you today.  *If you need a refill on your cardiac medications before your next appointment, please call your pharmacy*   Lab Work: Your physician recommends that you have lab work today(BMET)  If you have labs (blood work) drawn today and your tests are completely normal, you will receive your results only by: Marland Kitchen MyChart Message (if you have MyChart) OR . A paper copy in the mail If you have any lab test that is abnormal or we need to change your treatment, we will call you to review the results.   Testing/Procedures: 1- [place]  Your caregiver has ordered a Stress Test with nuclear imaging. The purpose of this test is to evaluate the blood supply to your heart muscle. This procedure is referred to as a "Non-Invasive Stress Test." This is because other than having an IV started in your vein, nothing is inserted or "invades" your body. Cardiac stress tests are done to find areas of poor blood flow to the heart by determining the extent of coronary artery disease (CAD). Some patients exercise on a treadmill, which naturally increases the blood flow to your heart, while others who are  unable to walk on a treadmill due to physical limitations have a pharmacologic/chemical stress agent called Lexiscan . This medicine will mimic walking on a treadmill by temporarily increasing your coronary blood flow.   Please note: these test may take anywhere between 2-4 hours to complete  PLEASE REPORT TO [place] AT THE FIRST DESK WILL DIRECT YOU WHERE TO GO  Date of Procedure:_____________________________________  Arrival Time for Procedure:______________________________  Instructions regarding medication:   __x__ : Hold diabetes medication the evening prior and morning of procedure(metformin and  jardiance)  __x__:  Hold betablocker(s) night before procedure and morning of procedure(metoprolol)  __x__:  Hold other medications as follows:___HCTZ hold am of _________  PLEASE NOTIFY THE OFFICE AT LEAST 24 HOURS IN ADVANCE IF YOU ARE UNABLE TO [place].  [phone] AND  PLEASE NOTIFY NUCLEAR MEDICINE AT [facility] AT LEAST 24 HOURS IN ADVANCE IF YOU ARE UNABLE TO KEEP YOUR APPOINTMENT. [phone]  How to prepare for your Myoview test:  1. Do not eat or drink after midnight 2. No caffeine for 24 hours prior to test 3. No smoking 24 hours prior to test. 4. Your medication may be taken with water.  If your doctor stopped a medication because of this test, do not take that medication. 5. Ladies, please do not wear dresses.  Skirts or pants are appropriate. Please wear a short sleeve shirt. 6. No perfume, cologne or lotion. 7. Wear comfortable walking shoes. No heels!     Follow-Up: At [facility], you and your health needs are our priority.  As part of our continuing mission to provide you with exceptional heart care, we have created designated Provider Care Teams.  These Care Teams include your primary Cardiologist (physician) and Advanced Practice Providers (APPs -  Physician Assistants and Nurse Practitioners) who all work together to provide you with the care you need, when you need it.  We recommend signing up for the patient portal called "MyChart".  Sign up information is provided on this After Visit Summary.  MyChart is used to connect with patients for Virtual Visits (Telemedicine).  Patients are able to view lab/test results, encounter notes, upcoming appointments, etc.  Non-urgent messages can be sent to your provider as well.   To learn more about what you can do with MyChart, go to [email or web address].    Your next appointment:   6 month(s)  The format for your next appointment:   In Person  Provider:    You may see Ida Rogue, MD or Murray Hodgkins, NP.

## 2019-10-05 NOTE — Progress Notes (Signed)
Office Visit    Patient Name: [name]. Date of Encounter: 10/05/2019  Primary Care Provider:  [name], DO Primary Cardiologist:  Ida Rogue, MD  Chief Complaint    64 y/o ? with a history of paroxysmal atrial fibrillation, recurrent strokes with residual memory loss and vision impairment, peripheral vascular disease, type 2 diabetes mellitus, hypertension, hyperlipidemia, microcytic anemia, remote alcohol abuse, neuropathy, chronic opioid usage, and tobacco abuse, who presents for preoperative evaluation for right above-the-knee amputation.  Past Medical History    Past Medical History:  Diagnosis Date  . Acute left PCA stroke ([place]) 02/17/2015  . Anemia   . Atherosclerosis of native arteries of the extremities with gangrene ([place]) 05/08/2018  . Atrial fibrillation ([place])   . Blind    right eye  . Coronary artery calcification seen on CT scan    a. 08/2018 CT Chest: Mild cor Ca2+.  . Diabetes mellitus with complication ([place])   . Dilated aortic root ([place])    a. 04/2018 Echo: 4.1cm. Asc Ao 3.5cm; b. 04/2018 CT: Asc Ao 3.6cm. 4.1cm @ sinus of Valsalva.  . Dysrhythmia    a fib  . Gangrene of right foot ([place]) 06/02/2018  . GERD (gastroesophageal reflux disease)   . History of echocardiogram    a. 04/2018 Echo: Ef 60-65%, no rwma, midly to mod dil Ao root - 4.1cm. Asc Ao 3.5cm. Mild MR. Nl RV fxn. Nl PASP.  Marland Kitchen History of hernia repair   . History of stress test    a. 2016 MV ([name]): EF 58%, no ischemia.  . Hyperlipidemia   . Hypertension   . Leg pain   . Legally blind   . Necrotic toes ([place]) 02/21/2018  . PAF (paroxysmal atrial fibrillation) ([name])    a. on Eliquis as of 2018; b. CHADS2VASc => 5 (HTN, DM, stroke x 2, vascular disease)  . Peripheral vascular disease ([place])    a. followed by Dr. Lucky Cowboy; b. s/p kissing balloon stents and right external iliac stent in 10/2017; c. 05/2018 s/p R BKA; c. 08/2019: Bilat Iliac PTA & DBA or REIA.  . Pulmonary nodules   .  Stroke [facility])    a. 2016 & 2018  . Stroke [facility]) 07/11/2018   Past Surgical History:  Procedure Laterality Date  . AMPUTATION Right 06/02/2018   Procedure: AMPUTATION BELOW KNEE;  Surgeon: [name], MD;  Location: [facility] ORS;  Service: Vascular;  Laterality: Right;  . APPENDECTOMY    . COLONOSCOPY WITH PROPOFOL N/A 05/12/2018   Procedure: COLONOSCOPY WITH PROPOFOL;  Surgeon: [name], MD;  Location: [facility] ENDOSCOPY;  Service: Gastroenterology;  Laterality: N/A;  . ESOPHAGOGASTRODUODENOSCOPY (EGD) WITH PROPOFOL N/A 05/12/2018   Procedure: ESOPHAGOGASTRODUODENOSCOPY (EGD) WITH PROPOFOL;  Surgeon: [name], MD;  Location: [facility] ENDOSCOPY;  Service: Gastroenterology;  Laterality: N/A;  . GIVENS CAPSULE STUDY N/A 07/13/2018   Procedure: GIVENS CAPSULE STUDY;  Surgeon: [name], MD;  Location: [facility] ENDOSCOPY;  Service: Gastroenterology;  Laterality: N/A;  . HERNIA REPAIR     UMBILICAL  . LOWER EXTREMITY ANGIOGRAPHY Right 10/25/2017   Procedure: LOWER EXTREMITY ANGIOGRAPHY;  Surgeon: [name], MD;  Location: [place] CV LAB;  Service: Cardiovascular;  Laterality: Right;  . LOWER EXTREMITY ANGIOGRAPHY Right 01/13/2018   Procedure: LOWER EXTREMITY ANGIOGRAPHY;  Surgeon: [name], MD;  Location: [place] CV LAB;  Service: Cardiovascular;  Laterality: Right;  . LOWER EXTREMITY ANGIOGRAPHY Right 08/28/2019   Procedure: LOWER EXTREMITY ANGIOGRAPHY;  Surgeon: Lucky Cowboy,  Erskine Squibb, MD;  Location: [place] CV LAB;  Service: Cardiovascular;  Laterality: Right;  . LOWER EXTREMITY INTERVENTION  10/25/2017   Procedure: LOWER EXTREMITY INTERVENTION;  Surgeon: [name], MD;  Location: [place] CV LAB;  Service: Cardiovascular;;  . SKIN SPLIT GRAFT Right 12/28/2018   Procedure: SKIN GRAFT SPLIT THICKNESS ( SYNTHETIC );  Surgeon: [name], MD;  Location: [facility] ORS;  Service: Vascular;  Laterality: Right;  . TONSILLECTOMY    . WOUND DEBRIDEMENT Right 08/08/2018   Procedure: DEBRIDEMENT  WOUND WITH WOUND VAC APPLICATION;  Surgeon: [name], MD;  Location: [facility] ORS;  Service: Vascular;  Laterality: Right;    Allergies  Allergies  Allergen Reactions  . Sodium Pentobarbital [Pentobarbital] Shortness Of Breath  . Lipitor [Atorvastatin] Rash    History of Present Illness    64 year old male with a history of paroxysmal atrial fibrillation, recurrent strokes with residual memory loss and vision impairment, peripheral vascular disease status post right external iliac stenting complicated by worsening right lower extremity small vessel disease and necrosis with right below the knee amputation in October 2019, type 2 diabetes mellitus, hypertension, hyperlipidemia, microcytic anemia, remote alcohol abuse with neuropathy, chronic opioid usage, tobacco abuse.  He has been chronically anticoagulated in the setting of paroxysmal atrial fibrillation.  Most recent echo in September 2019 showed an EF of 60 to 65% with mildly to moderately dilated aortic root of 4.1 cm.  Mild MR was noted.  He was last seen in clinic in March 2020.  He was in rate controlled and asymptomatic atrial fibrillation at that time.  In January of this year, he underwent bilateral common iliac kissing balloon angioplasty and balloon angioplasty of the right external iliac artery in the setting of restenosis.  He has been followed closely by wound care and vascular surgery and has been dealing with a stump infection on the right and is now felt to require a right above-the-knee amputation.  Patient and wife are present today.  He has not been having any chest pain or dyspnea but ambulation is very very limited.  Uses a walker to get around.  He does not really do any housework or yard work due to limitations in ambulation.  He cannot walk up stairs.  He denies palpitations, PND, orthopnea, dizziness, syncope, edema, or early satiety.  Home Medications    Prior to Admission medications   Medication Sig Start Date End  Date Taking? Authorizing Provider  acetaminophen (TYLENOL) 500 MG tablet Take 1,000 mg by mouth every 6 (six) hours as needed (for pain.).   Yes [provider]  apixaban (ELIQUIS) 5 MG TABS tablet Take 1 tablet (5 mg total) by mouth 2 (two) times daily. 07/03/19  Yes Johnson, Megan P, DO  aspirin 81 MG EC tablet TAKE 1 TABLET BY MOUTH EVERY DAY 04/17/19  Yes Johnson, Megan P, DO  cholecalciferol (VITAMIN D3) 25 MCG (1000 UT) tablet Take 5,000 Units by mouth daily.    Yes [provider]  docusate sodium (COLACE) 100 MG capsule Take 1 capsule (100 mg total) by mouth daily. Do not take if you have loose stools 12/13/18  Yes Earlie Server, MD  Elastic Bandages & Supports [facility]) MISC Apply Tubigrip G to stump through the knee, double layer if tolerated 08/28/19  Yes [provider]  empagliflozin (JARDIANCE) 10 MG TABS tablet Take 5 mg by mouth daily. 07/03/19  Yes Johnson, Megan P, DO  ezetimibe-simvastatin (VYTORIN) 10-40 MG tablet TAKE 1 TABLET  BY MOUTH DAILY AT 6 PM. 09/15/19  Yes Gollan, Kathlene November, MD  ferrous sulfate (FERROUSUL) 325 (65 FE) MG tablet Take 1 tablet (325 mg total) by mouth 2 (two) times daily with a meal. 03/14/19  Yes Earlie Server, MD  gabapentin (NEURONTIN) 800 MG tablet Take 1 tablet (800 mg total) by mouth 3 (three) times daily. 07/03/19  Yes Johnson, Megan P, DO  Gauze Pads & Dressings (KERLIX BANDAGE ROLL) MISC Apply topically. 08/28/19  Yes [provider]  hydrochlorothiazide (MICROZIDE) 12.5 MG capsule TAKE 1 CAPSULE BY MOUTH EVERY DAY 09/29/19  Yes Johnson, Megan P, DO  lansoprazole (PREVACID) 30 MG capsule Take 30 mg by mouth daily at 12 noon.   Yes [provider]  metFORMIN (GLUCOPHAGE) 1000 MG tablet Take 1 tablet (1,000 mg total) by mouth 2 (two) times daily with a meal. 12/27/18  Yes Johnson, Megan P, DO  metoprolol tartrate (LOPRESSOR) 25 MG tablet Take 1 tablet (25 mg total) by mouth 2 (two) times daily. 12/27/18  Yes Johnson, Megan P,  DO  Multiple Vitamin (MULTIVITAMIN WITH MINERALS) TABS tablet Take 1 tablet by mouth daily. 06/24/17  Yes Fritzi Mandes, MD  ONE [facility] ULTRA TEST test strip USE TO TEST BLOOD SUGAR BID 09/26/18  Yes Johnson, Megan P, DO  ONETOUCH DELICA LANCETS [id number] MISC USE AS DIRECTED TO TEST BLOOD SUGAR BID 09/26/18  Yes Johnson, Megan P, DO  oxyCODONE-acetaminophen (PERCOCET/ROXICET) 5-325 MG tablet Take 1 tablet by mouth every 6 (six) hours as needed for severe pain. 10/04/19  Yes Kris Hartmann, NP  vitamin C (ASCORBIC ACID) 500 MG tablet Take 500 mg by mouth daily.   Yes [provider]  Wound Dressings (MEDIHONEY CA ALGINATE 4"X5") PADS Apply topically. 08/28/19  Yes [provider]    Review of Systems    He denies chest pain, palpitations, dyspnea, pnd, orthopnea, n, v, dizziness, syncope, edema, weight gain, or early satiety.  Activity very limited.  All other systems reviewed and are otherwise negative except as noted above.  Physical Exam    VS:  BP 110/68 (BP Location: Left Arm, Patient Position: Sitting, Cuff Size: Normal)   Pulse 80   Ht 5\' 10"  (1.778 m)   Wt 190 lb (86.2 kg)   SpO2 97%   BMI 27.26 kg/m  , BMI Body mass index is 27.26 kg/m. GEN: Well nourished, well developed, in no acute distress. HEENT: normal. Neck: Supple, no JVD, carotid bruits, or masses. Cardiac: Irregularly irregular, no murmurs, rubs, or gallops. No clubbing, cyanosis.  Right BKA.  1-2+ left lower extremity edema.  Left PT 1+. Respiratory:  Respirations regular and unlabored, clear to auscultation bilaterally. GI: Soft, nontender, nondistended, BS + x 4. MS: no deformity or atrophy. Skin: warm and dry, no rash. Neuro:  Strength and sensation are intact. Psych: Normal affect.  Accessory Clinical Findings    ECG personally reviewed by me today -atrial fibrillation, 80, septal infarct- no acute changes.  Lab Results  Component Value Date   WBC 8.5 07/12/2019   HGB 12.7 (L) 07/12/2019   HCT  40.9 07/12/2019   MCV 88.5 07/12/2019   PLT 187 07/12/2019   Lab Results  Component Value Date   CREATININE 0.58 (L) 08/28/2019   BUN 18 08/28/2019   NA 142 07/03/2019   K 4.1 07/03/2019   CL 101 07/03/2019   CO2 24 07/03/2019   Lab Results  Component Value Date   ALT 20 07/03/2019   AST 21 07/03/2019  ALKPHOS 90 07/03/2019   BILITOT 0.5 07/03/2019   Lab Results  Component Value Date   CHOL 110 07/03/2019   HDL 47 07/03/2019   LDLCALC 44 07/03/2019   TRIG 99 07/03/2019   CHOLHDL 2.8 06/22/2017    Lab Results  Component Value Date   HGBA1C 6.8 07/03/2019    Assessment & Plan    1.  Preoperative cardiovascular examination/PAD: Patient with a history of peripheral arterial disease with prior right BKA and recent bilateral iliac balloon angioplasty and right external iliac drug coated balloon angioplasty for in-stent restenosis.  His right BKA site has been healing poorly despite close follow-up in wound care and vascular surgery clinic.  He is now pending right above-the-knee amputation.  From a cardiac standpoint, he does not experience chest pain or dyspnea however, his activity is very very limited.  He does have multiple risk factors for coronary artery disease and also coronary calcium on CT last year.  Risk of major cardiac event calculates to 6.6%.  As he is sedentary and I am unable to gauge his peak metabolic equivalents, in order to best risk stratify, I will arrange for a Lexiscan Myoview to rule out ischemia.  2.  Permanent atrial fibrillation: This is well rate controlled on beta-blocker therapy.  He is anticoagulated with Eliquis.  In the setting of normal renal function, permanent A. fib, and prior history of stroke, prefer to avoid extended period of time off of Eliquis therefore, he may come off of Eliquis 48 hours prior to his surgery.  3.  Essential hypertension: Stable.  4.  Hyperlipidemia: LDL of 44 in November on Vytorin therapy.  LFTs were normal at that  time.  5.  Type 2 diabetes mellitus: Oral therapy for primary care.  Most recent A1c was 6.8.  6.  History of tobacco abuse: Previously quit.  7.  Alcohol abuse: Currently only drinking an occasional beer.  8.  Disposition: Follow-up stress testing as above.  Provided that this is normal, patient may proceed to surgery and hold Eliquis for 48 hours prior to surgery with plan to resume postop.  Otherwise follow-up in clinic in 6 months or sooner if necessary.   Murray Hodgkins, NP 10/05/2019, 5:49 PM

## 2019-10-06 LAB — BASIC METABOLIC PANEL
BUN/Creatinine Ratio: 27 — ABNORMAL HIGH (ref 10–24)
BUN: 19 mg/dL (ref 8–27)
CO2: 23 mmol/L (ref 20–29)
Calcium: 9.5 mg/dL (ref 8.6–10.2)
Chloride: 101 mmol/L (ref 96–106)
Creatinine, Ser: 0.7 mg/dL — ABNORMAL LOW (ref 0.76–1.27)
GFR calc Af Amer: 115 mL/min/{1.73_m2} (ref >59)
GFR calc non Af Amer: 100 mL/min/{1.73_m2} (ref >59)
Glucose: 131 mg/dL — ABNORMAL HIGH (ref 65–99)
Potassium: 4.8 mmol/L (ref 3.5–5.2)
Sodium: 142 mmol/L (ref 134–144)

## 2019-10-13 ENCOUNTER — Telehealth: Payer: Self-pay

## 2019-10-13 ENCOUNTER — Encounter
Admission: RE | Admit: 2019-10-13 | Discharge: 2019-10-13 | Disposition: A | Discharge status: Home / Self Care | Payer: Medicare HMO | Source: Ambulatory Visit | Attending: Nurse Practitioner | Admitting: Nurse Practitioner

## 2019-10-13 ENCOUNTER — Other Ambulatory Visit: Payer: Self-pay

## 2019-10-13 DIAGNOSIS — I4891 Unspecified atrial fibrillation: Secondary | ICD-10-CM | POA: Insufficient documentation

## 2019-10-13 DIAGNOSIS — Z01810 Encounter for preprocedural cardiovascular examination: Secondary | ICD-10-CM | POA: Diagnosis not present

## 2019-10-13 DIAGNOSIS — Z01818 Encounter for other preprocedural examination: Secondary | ICD-10-CM

## 2019-10-13 LAB — NM MYOCAR MULTI W/SPECT W/WALL MOTION / EF
Estimated workload: 1 METS
Exercise duration (min): 0 min
Exercise duration (sec): 0 s
LV dias vol: 64 mL (ref 62–150)
LV sys vol: 19 mL
MPHR: 156 {beats}/min
Peak HR: 141 {beats}/min
Percent HR: 90 %
Rest HR: 86 {beats}/min
SDS: 0
SRS: 0
SSS: 0
TID: 1.02

## 2019-10-13 MED ORDER — TECHNETIUM TC 99M TETROFOSMIN IV KIT
10.0000 | PACK | Freq: Once | INTRAVENOUS | Status: AC | PRN
  Administered 2019-10-13: 10.9 via INTRAVENOUS

## 2019-10-13 MED ORDER — TECHNETIUM TC 99M TETROFOSMIN IV KIT
32.4400 | PACK | Freq: Once | INTRAVENOUS | Status: AC | PRN
  Administered 2019-10-13: 32.44 via INTRAVENOUS

## 2019-10-13 MED ORDER — REGADENOSON 0.4 MG/5ML IV SOLN
0.4000 mg | Freq: Once | INTRAVENOUS | Status: AC
  Administered 2019-10-13: 0.4 mg via INTRAVENOUS

## 2019-10-13 NOTE — Telephone Encounter (Signed)
-----   Message from Theora Gianotti, NP sent at 10/13/2019  2:39 PM EST ----- Normal stress test.  Doristine Devoid news.  He may proceed to surgery without any further cardiac testing.

## 2019-10-13 NOTE — Telephone Encounter (Signed)
Call attempted. No answer, vm box error.

## 2019-10-13 NOTE — Telephone Encounter (Signed)
Incoming call received by wife, Neoma Laming.  Results discussed. No further questions or orders at this time.   Advised pt to call for any further questions or concerns.

## 2019-10-16 ENCOUNTER — Telehealth (INDEPENDENT_AMBULATORY_CARE_PROVIDER_SITE_OTHER): Payer: Self-pay

## 2019-10-16 ENCOUNTER — Other Ambulatory Visit (INDEPENDENT_AMBULATORY_CARE_PROVIDER_SITE_OTHER): Payer: Self-pay | Admitting: Nurse Practitioner

## 2019-10-16 ENCOUNTER — Ambulatory Visit: Payer: Medicare HMO

## 2019-10-16 DIAGNOSIS — T8740 Infection of amputation stump, unspecified extremity: Secondary | ICD-10-CM

## 2019-10-16 MED ORDER — OXYCODONE-ACETAMINOPHEN 5-325 MG PO TABS: 1.0000 | ORAL_TABLET | Freq: Four times a day (QID) | ORAL | 0 refills | Status: DC | PRN

## 2019-10-16 NOTE — Telephone Encounter (Signed)
Message was left on the voicemail

## 2019-10-16 NOTE — Telephone Encounter (Signed)
sent 

## 2019-10-19 ENCOUNTER — Telehealth (INDEPENDENT_AMBULATORY_CARE_PROVIDER_SITE_OTHER): Payer: Medicare HMO | Admitting: Family Medicine

## 2019-10-19 ENCOUNTER — Telehealth (INDEPENDENT_AMBULATORY_CARE_PROVIDER_SITE_OTHER): Payer: Self-pay

## 2019-10-19 ENCOUNTER — Encounter: Payer: Self-pay | Admitting: Family Medicine

## 2019-10-19 ENCOUNTER — Ambulatory Visit: Payer: Medicare HMO

## 2019-10-19 DIAGNOSIS — E782 Mixed hyperlipidemia: Secondary | ICD-10-CM | POA: Diagnosis not present

## 2019-10-19 DIAGNOSIS — I129 Hypertensive chronic kidney disease with stage 1 through stage 4 chronic kidney disease, or unspecified chronic kidney disease: Secondary | ICD-10-CM | POA: Diagnosis not present

## 2019-10-19 DIAGNOSIS — I70209 Unspecified atherosclerosis of native arteries of extremities, unspecified extremity: Secondary | ICD-10-CM | POA: Diagnosis not present

## 2019-10-19 DIAGNOSIS — E1142 Type 2 diabetes mellitus with diabetic polyneuropathy: Secondary | ICD-10-CM | POA: Diagnosis not present

## 2019-10-19 DIAGNOSIS — I739 Peripheral vascular disease, unspecified: Secondary | ICD-10-CM

## 2019-10-19 DIAGNOSIS — E1151 Type 2 diabetes mellitus with diabetic peripheral angiopathy without gangrene: Secondary | ICD-10-CM | POA: Diagnosis not present

## 2019-10-19 DIAGNOSIS — T8740 Infection of amputation stump, unspecified extremity: Secondary | ICD-10-CM

## 2019-10-19 DIAGNOSIS — M79609 Pain in unspecified limb: Secondary | ICD-10-CM | POA: Diagnosis not present

## 2019-10-19 DIAGNOSIS — T8789 Other complications of amputation stump: Secondary | ICD-10-CM

## 2019-10-19 MED ORDER — GABAPENTIN 800 MG PO TABS: 1200.0000 mg | ORAL_TABLET | Freq: Three times a day (TID) | ORAL | 1 refills | Status: DC

## 2019-10-19 MED ORDER — METFORMIN HCL 1000 MG PO TABS: 1000.0000 mg | ORAL_TABLET | Freq: Two times a day (BID) | ORAL | 3 refills | Status: DC

## 2019-10-19 MED ORDER — EMPAGLIFLOZIN 10 MG PO TABS: 5.0000 mg | ORAL_TABLET | Freq: Every day | ORAL | 1 refills | Status: DC

## 2019-10-19 MED ORDER — METOPROLOL TARTRATE 25 MG PO TABS: 25.0000 mg | ORAL_TABLET | Freq: Two times a day (BID) | ORAL | 3 refills | Status: DC

## 2019-10-19 MED ORDER — APIXABAN 5 MG PO TABS: 5.0000 mg | ORAL_TABLET | Freq: Two times a day (BID) | ORAL | 1 refills | Status: AC

## 2019-10-19 NOTE — Progress Notes (Signed)
There were no vitals taken for this visit.   Subjective:    Patient ID: [name]., male    DOB: 20-Jul-1956, 64 y.o.   MRN: [id number]  HPI: [name]. is a 64 y.o. male  Chief Complaint  Patient presents with  . Diabetes  . Hypertension  . Hyperlipidemia   HYPERTENSION / HYPERLIPIDEMIA Satisfied with current treatment? yes Duration of hypertension: chronic BP monitoring frequency: not checking BP medication side effects: no Past BP meds: metoprolol, HCTZ Duration of hyperlipidemia: chronic Cholesterol medication side effects: no Cholesterol supplements: none Past cholesterol medications: Zetia and simvastatin Medication compliance: excellent compliance Aspirin: yes Recent stressors: yes Recurrent headaches: no Visual changes: no Palpitations: no Dyspnea: no Chest pain: no Lower extremity edema: no Dizzy/lightheaded: no  DIABETES Hypoglycemic episodes:no Polydipsia/polyuria: no Visual disturbance: no Chest pain: no Paresthesias: no Glucose Monitoring: no Taking Insulin?: no Blood Pressure Monitoring: not checking Retinal Examination: Not up to Date Foot Exam: Up to Date Diabetic Education: Completed Pneumovax: Up to Date Influenza: Up to Date Aspirin: yes   Needing an AKA due to his wound. Having a lot of pain with that right now. Working with vascular.   Relevant past medical, surgical, family and social history reviewed and updated as indicated. Interim medical history since our last visit reviewed. Allergies and medications reviewed and updated.  Review of Systems  Constitutional: Negative.   Respiratory: Negative.   Cardiovascular: Negative.   Gastrointestinal: Negative.   Musculoskeletal: Positive for myalgias. Negative for arthralgias, back pain, gait problem, joint swelling, neck pain and neck stiffness.  Skin: Positive for wound. Negative for color change, pallor and rash.  Neurological: Negative.   Psychiatric/Behavioral:  Negative.     Per HPI unless specifically indicated above     Objective:    There were no vitals taken for this visit.  Wt Readings from Last 3 Encounters:  10/05/19 190 lb (86.2 kg)  08/28/19 190 lb (86.2 kg)  06/02/19 188 lb (85.3 kg)    Physical Exam Vitals and nursing note reviewed.  Constitutional:      General: He is not in acute distress.    Appearance: Normal appearance. He is not ill-appearing, toxic-appearing or diaphoretic.  HENT:     Head: Normocephalic and atraumatic.     Right Ear: External ear normal.     Left Ear: External ear normal.     Nose: Nose normal.     Mouth/Throat:     Mouth: Mucous membranes are moist.     Pharynx: Oropharynx is clear.  Eyes:     General: No scleral icterus.       Right eye: No discharge.        Left eye: No discharge.     Conjunctiva/sclera: Conjunctivae normal.     Pupils: Pupils are equal, round, and reactive to light.  Pulmonary:     Effort: Pulmonary effort is normal. No respiratory distress.     Comments: Speaking in full sentences Musculoskeletal:        General: Normal range of motion.     Cervical back: Normal range of motion.     Comments: L BKA   Skin:    Coloration: Skin is not jaundiced or pale.     Findings: No bruising, erythema, lesion or rash.  Neurological:     Mental Status: He is alert and oriented to person, place, and time. Mental status is at baseline.  Psychiatric:        Mood and  Affect: Mood normal.        Behavior: Behavior normal.        Thought Content: Thought content normal.        Judgment: Judgment normal.     Results for orders placed or performed during the hospital encounter of 10/13/19  NM Myocar Multi W/Spect W/Wall Motion / EF  Result Value Ref Range   Rest HR 86 bpm   Rest BP 135/68 mmHg   Exercise duration (sec) 0 sec   Percent HR 90 %   Exercise duration (min) 0 min   Estimated workload 1.0 METS   Peak HR 141 bpm   Peak BP 123/66 mmHg   MPHR 156 bpm   SSS 0    SRS 0     SDS 0    TID 1.02    LV sys vol 19 mL   LV dias vol 64 62 - 150 mL      Assessment & Plan:   Problem List Items Addressed This Visit      Cardiovascular and Mediastinum   Diabetes type 2 with atherosclerosis of arteries of extremities ([name])    Under good control on current regimen. Continue current regimen. Continue to monitor. Call with any concerns. Refills given. Will get him in for labs ASAP.       Relevant Medications   metoprolol tartrate (LOPRESSOR) 25 MG tablet   metFORMIN (GLUCOPHAGE) 1000 MG tablet   empagliflozin (JARDIANCE) 10 MG TABS tablet   apixaban (ELIQUIS) 5 MG TABS tablet   Other Relevant Orders   CBC with Differential/Platelet   Comprehensive metabolic panel   Microalbumin, Urine Waived   Bayer DCA Hb A1c Waived   PVD (peripheral vascular disease) ([place]) - Primary    Continue to follow with vascular. Worsening. Call with any concerns.       Relevant Medications   metoprolol tartrate (LOPRESSOR) 25 MG tablet   apixaban (ELIQUIS) 5 MG TABS tablet   Other Relevant Orders   CBC with Differential/Platelet   Comprehensive metabolic panel     Endocrine   Diabetic peripheral neuropathy ([place])    Under good control on current regimen. Continue current regimen. Continue to monitor. Call with any concerns. Refills given. Will get him in for labs ASAP.      Relevant Medications   metFORMIN (GLUCOPHAGE) 1000 MG tablet   gabapentin (NEURONTIN) 800 MG tablet   empagliflozin (JARDIANCE) 10 MG TABS tablet     Genitourinary   Benign hypertensive renal disease    Under good control on current regimen. Continue current regimen. Continue to monitor. Call with any concerns. Refills given. Will get him in for labs and BP check ASAP.      Relevant Orders   CBC with Differential/Platelet   Comprehensive metabolic panel   Microalbumin, Urine Waived     Other   Stump pain ([name]) (Right) (Chronic)    Will increase his gabapentin. Continue to follow with vascular.  Call with any concerns.       Relevant Orders   CBC with Differential/Platelet   Comprehensive metabolic panel   Hyperlipidemia    Under good control on current regimen. Continue current regimen. Continue to monitor. Call with any concerns. Refills given. Will get him in for labs ASAP.      Relevant Medications   metoprolol tartrate (LOPRESSOR) 25 MG tablet   apixaban (ELIQUIS) 5 MG TABS tablet   Other Relevant Orders   Lipid Panel w/o Chol/HDL Ratio   Amputation stump infection ([place])  Worsening. To have AKA. Continue to follow with vascular.           Follow up plan: Return in about 6 months (around 04/20/2020).   . This visit was completed via MyChart due to the restrictions of the COVID-19 pandemic. All issues as above were discussed and addressed. Physical exam was done as above through visual confirmation on MyChart. If it was felt that the patient should be evaluated in the office, they were directed there. The patient verbally consented to this visit. . Location of the patient: home . Location of the provider: home . Those involved with this call:  . Provider: Park Liter, DO . CMA: Tiffany Reel, CMA . Front Desk/Registration: Don Perking  . Time spent on call: 25 minutes with patient face to face via video conference. More than 50% of this time was spent in counseling and coordination of care. 40 minutes total spent in review of patient's record and preparation of their chart.

## 2019-10-19 NOTE — Telephone Encounter (Signed)
Pt's wife called and wants to know could she have a refill on the silver alginate cream and  Wants to know should she be applying it less or more she has been applying it every 36 hours. The pts wife also would like to know are there any updates on his surgery being scheduled.

## 2019-10-20 ENCOUNTER — Telehealth (INDEPENDENT_AMBULATORY_CARE_PROVIDER_SITE_OTHER): Payer: Self-pay

## 2019-10-20 ENCOUNTER — Encounter (INDEPENDENT_AMBULATORY_CARE_PROVIDER_SITE_OTHER): Payer: Self-pay

## 2019-10-20 ENCOUNTER — Encounter: Payer: Self-pay | Admitting: Family Medicine

## 2019-10-20 DIAGNOSIS — T8789 Other complications of amputation stump: Secondary | ICD-10-CM | POA: Diagnosis not present

## 2019-10-20 DIAGNOSIS — S98921A Partial traumatic amputation of right foot, level unspecified, initial encounter: Secondary | ICD-10-CM | POA: Diagnosis not present

## 2019-10-20 DIAGNOSIS — Z89511 Acquired absence of right leg below knee: Secondary | ICD-10-CM | POA: Diagnosis not present

## 2019-10-20 NOTE — Assessment & Plan Note (Signed)
Under good control on current regimen. Continue current regimen. Continue to monitor. Call with any concerns. Refills given. Will get him in for labs and BP check ASAP.

## 2019-10-20 NOTE — Telephone Encounter (Signed)
I attempted to contact the patient regarding his surgery and a message was left for a return call.

## 2019-10-20 NOTE — Telephone Encounter (Signed)
I called the pt's wife and made her aware of the NP's instructions she said she was going to call the pharmacy  and  Get it refilled.

## 2019-10-20 NOTE — Assessment & Plan Note (Signed)
Under good control on current regimen. Continue current regimen. Continue to monitor. Call with any concerns. Refills given. Will get him in for labs ASAP.

## 2019-10-20 NOTE — Telephone Encounter (Signed)
When we originally sent the order in for the silver alginate, we sent refills, does she have any left? If not we will have to call that refill in to the drugstore like last time.

## 2019-10-20 NOTE — Telephone Encounter (Signed)
Spoke with the patient's wife and he is now scheduled with Dr. Lucky Cowboy for a right AKA on 10/26/19. Patient will do a phone call pre-op on 10/24/19 between 1-5 pm and covid testing on 10/25/19 between 8-1 pm at the [facility]. Pre-surgical instructions were discussed and will be mailed to the patient.

## 2019-10-20 NOTE — Assessment & Plan Note (Signed)
Continue to follow with vascular. Worsening. Call with any concerns.

## 2019-10-20 NOTE — Assessment & Plan Note (Signed)
Will increase his gabapentin. Continue to follow with vascular. Call with any concerns.

## 2019-10-20 NOTE — Assessment & Plan Note (Signed)
Worsening. To have AKA. Continue to follow with vascular.

## 2019-10-23 ENCOUNTER — Other Ambulatory Visit: Payer: Medicare HMO

## 2019-10-23 ENCOUNTER — Other Ambulatory Visit (INDEPENDENT_AMBULATORY_CARE_PROVIDER_SITE_OTHER): Payer: Self-pay | Admitting: Nurse Practitioner

## 2019-10-23 ENCOUNTER — Other Ambulatory Visit: Payer: Self-pay

## 2019-10-23 DIAGNOSIS — T8789 Other complications of amputation stump: Secondary | ICD-10-CM

## 2019-10-23 DIAGNOSIS — M79609 Pain in unspecified limb: Secondary | ICD-10-CM

## 2019-10-23 DIAGNOSIS — E1151 Type 2 diabetes mellitus with diabetic peripheral angiopathy without gangrene: Secondary | ICD-10-CM | POA: Diagnosis not present

## 2019-10-23 DIAGNOSIS — E782 Mixed hyperlipidemia: Secondary | ICD-10-CM

## 2019-10-23 DIAGNOSIS — I129 Hypertensive chronic kidney disease with stage 1 through stage 4 chronic kidney disease, or unspecified chronic kidney disease: Secondary | ICD-10-CM | POA: Diagnosis not present

## 2019-10-23 DIAGNOSIS — I70209 Unspecified atherosclerosis of native arteries of extremities, unspecified extremity: Secondary | ICD-10-CM | POA: Diagnosis not present

## 2019-10-23 DIAGNOSIS — I739 Peripheral vascular disease, unspecified: Secondary | ICD-10-CM

## 2019-10-23 LAB — MICROALBUMIN, URINE WAIVED
Creatinine, Urine Waived: 50 mg/dL (ref 10–300)
Microalb, Ur Waived: 30 mg/L — ABNORMAL HIGH (ref 0–19)

## 2019-10-23 LAB — BAYER DCA HB A1C WAIVED: HB A1C (BAYER DCA - WAIVED): 7.6 % — ABNORMAL HIGH (ref <7.0)

## 2019-10-24 ENCOUNTER — Other Ambulatory Visit: Payer: Self-pay

## 2019-10-24 ENCOUNTER — Encounter
Admission: RE | Admit: 2019-10-24 | Discharge: 2019-10-24 | Disposition: A | Discharge status: Home / Self Care | Payer: Medicare HMO | Source: Ambulatory Visit | Attending: Vascular Surgery | Admitting: Vascular Surgery

## 2019-10-24 DIAGNOSIS — Z01818 Encounter for other preprocedural examination: Secondary | ICD-10-CM | POA: Insufficient documentation

## 2019-10-24 HISTORY — DX: Other amnesia: R41.3

## 2019-10-24 HISTORY — DX: Angiodysplasia of colon without hemorrhage: K55.20

## 2019-10-24 LAB — LIPID PANEL W/O CHOL/HDL RATIO
Cholesterol, Total: 92 mg/dL — ABNORMAL LOW (ref 100–199)
HDL: 36 mg/dL — ABNORMAL LOW (ref >39)
LDL Chol Calc (NIH): 38 mg/dL (ref 0–99)
Triglycerides: 89 mg/dL (ref 0–149)
VLDL Cholesterol Cal: 18 mg/dL (ref 5–40)

## 2019-10-24 LAB — COMPREHENSIVE METABOLIC PANEL
ALT: 18 IU/L (ref 0–44)
AST: 19 IU/L (ref 0–40)
Albumin/Globulin Ratio: 1.5 (ref 1.2–2.2)
Albumin: 4.1 g/dL (ref 3.8–4.8)
Alkaline Phosphatase: 85 IU/L (ref 39–117)
BUN/Creatinine Ratio: 23 (ref 10–24)
BUN: 17 mg/dL (ref 8–27)
Bilirubin Total: 0.5 mg/dL (ref 0.0–1.2)
CO2: 26 mmol/L (ref 20–29)
Calcium: 9.4 mg/dL (ref 8.6–10.2)
Chloride: 100 mmol/L (ref 96–106)
Creatinine, Ser: 0.74 mg/dL — ABNORMAL LOW (ref 0.76–1.27)
GFR calc Af Amer: 113 mL/min/{1.73_m2} (ref >59)
GFR calc non Af Amer: 97 mL/min/{1.73_m2} (ref >59)
Globulin, Total: 2.8 g/dL (ref 1.5–4.5)
Glucose: 143 mg/dL — ABNORMAL HIGH (ref 65–99)
Potassium: 4.6 mmol/L (ref 3.5–5.2)
Sodium: 140 mmol/L (ref 134–144)
Total Protein: 6.9 g/dL (ref 6.0–8.5)

## 2019-10-24 LAB — CBC WITH DIFFERENTIAL/PLATELET
Basophils Absolute: 0 10*3/uL (ref 0.0–0.2)
Basos: 1 %
EOS (ABSOLUTE): 0.3 10*3/uL (ref 0.0–0.4)
Eos: 4 %
Hematocrit: 35 % — ABNORMAL LOW (ref 37.5–51.0)
Hemoglobin: 11.2 g/dL — ABNORMAL LOW (ref 13.0–17.7)
Immature Grans (Abs): 0 10*3/uL (ref 0.0–0.1)
Immature Granulocytes: 0 %
Lymphocytes Absolute: 0.9 10*3/uL (ref 0.7–3.1)
Lymphs: 10 %
MCH: 27 pg (ref 26.6–33.0)
MCHC: 32 g/dL (ref 31.5–35.7)
MCV: 84 fL (ref 79–97)
Monocytes Absolute: 0.5 10*3/uL (ref 0.1–0.9)
Monocytes: 6 %
Neutrophils Absolute: 6.6 10*3/uL (ref 1.4–7.0)
Neutrophils: 79 %
Platelets: 199 10*3/uL (ref 150–450)
RBC: 4.15 x10E6/uL (ref 4.14–5.80)
RDW: 14.3 % (ref 11.6–15.4)
WBC: 8.3 10*3/uL (ref 3.4–10.8)

## 2019-10-24 NOTE — Pre-Procedure Instructions (Signed)
Study Result Report from Dr. Sharolyn Douglas on 10/13/19  There was no ST segment deviation noted during stress.  No T wave inversion was noted during stress.  The study is normal.  This is a low risk study.  The left ventricular ejection fraction is normal (55-65%).  Atrial fibrillation on baseline EKG.

## 2019-10-24 NOTE — Patient Instructions (Addendum)
INSTRUCTIONS FOR SURGERY     Your surgery is scheduled for:   Thursday, MARCH 25TH     To find out your arrival time for the day of surgery,          please call [phone] between 1 pm and 3 pm on :  Wednesday, MARCH 24TH     When you arrive for surgery, report to the [facility].       Do NOT stop on the first floor to register.    REMEMBER: Instructions that are not followed completely may result in serious medical risk,  up to and including death, or upon the discretion of your surgeon and anesthesiologist,            your surgery may need to be rescheduled.  __X__ 1. Do not eat food after midnight the night before your procedure.                    No gum, candy, lozenger, tic tacs, tums or hard candies.                  ABSOLUTELY NOTHING SOLID IN YOUR MOUTH AFTER MIDNIGHT                    You may drink unlimited clear liquids up to 2 hours before you are scheduled to arrive for surgery.                   Do not drink anything within those 2 hours unless you need to take medicine, then take the                   smallest amount you need.  Clear liquids include:  water, apple juice without pulp,                   any flavor Gatorade, Black coffee, black tea.  Sugar may be added but no dairy/ honey /lemon.                        Broth and jello is not considered a clear liquid.  __x__  2. On the morning of surgery, please brush your teeth with toothpaste and water. You may rinse with                  mouthwash if you wish but DO NOT SWALLOW TOOTHPASTE OR MOUTHWASH  __X___3. NO alcohol for 24 hours before or after surgery.  __x___ 4.  Do NOT smoke or use e-cigarettes for 24 HOURS PRIOR TO SURGERY.                      DO NOT Use any chewable tobacco products for at least 6 hours prior to surgery.  __x___ 5. If you start any new medication after this appointment and prior to surgery, please        Bring it with you on the day of surgery.  ___x__ 6. Notify your doctor if there is any change in your medical condition, such as fever,  infection, vomitting, diarrhea or any open sores.  __x___ 7.  USE the CHG SOAP as instructed, the night before surgery and the day of surgery.                   Once you have washed with this soap, do NOT use any of the following: Powders, perfumes                    or lotions. Please do not wear make up, hairpins, clips or nail polish. You MAY wear deodorant.                   Men may shave their face and neck.  Women need to shave 48 hours prior to surgery.                   DO NOT wear ANY jewelry on the day of surgery. If there are rings that are too tight to                    remove easily, please address this prior to the surgery day. Piercings need to be removed.                                                                     NO METAL ON YOUR BODY.                    Do NOT bring any valuables.  If you came to Pre-Admit testing then you will not need license,                     insurance card or credit card.  If you will be staying overnight, please either leave your things in                     the car or have your family be responsible for these items.                     [facility] IS NOT RESPONSIBLE FOR BELONGINGS OR VALUABLES.  ___X__ 8. DO NOT wear contact lenses on surgery day.  You may not have dentures,                     Hearing aides, contacts or glasses in the operating room. These items can be                    Placed in the Recovery Room to receive immediately after surgery.  __x___ 9. IF YOU ARE SCHEDULED TO GO HOME ON THE SAME DAY, YOU MUST                   Have someone to drive you home and to stay with you  for the first 24 hours.                    Have an arrangement prior to arriving on surgery day.  ___x__ 10. Take the following medications on the morning of surgery with a sip of water:  1. METOPROLOL                     2. PREVACID                     3. GABAPENTIN                     4. PERCOCET, if needed                     5.                    _____ 11.  Follow any instructions provided to you by your surgeon.                        Such as enema, clear liquid bowel prep  __X__  12. STOP  ELIQUIS AS OF: today, march 23rd. NO NIGHT TIME PILL                     DO NOT STOP ASPIRIN BUT DO NOT TAKE IT ON SURGERY DAY                       THIS INCLUDES BC POWDERS / GOODIES POWDER  __x___ 13. STOP Anti-inflammatories as of:  NOW, MARCH 23RD                      This includes IBUPROFEN / MOTRIN / ADVIL / ALEVE/ NAPROXYN                    YOU MAY TAKE TYLENOL ANY TIME PRIOR TO SURGERY.  __x___ 14.  TAKE VITAMINS TODAY AND TOMORROW BUT DO NOT TAKE ON THE                      Morning of surgery.  ___x___16.  Stop Metformin 2 full days prior to surgery.  Stop on: today, MARCH 23RD                    Do not take jardiance on day of surgery                     Do NOT take any diabetes medications on surgery day.  ___X___17.  Continue to take the following medications but do not take on the morning of surgery:                      HYDROCHLOROTHIAZIDE // COLACE //VITAMINS // ASPIRIN  ___X___18. If staying overnight, please have appropriate shoes to wear to be able to walk around the unit.                   Wear clean and comfortable clothing to the hospital.                    Have loose pants to be able to visualize the bandages.  Bring phone numbers for contact people.

## 2019-10-25 ENCOUNTER — Other Ambulatory Visit
Admission: RE | Admit: 2019-10-25 | Discharge: 2019-10-25 | Disposition: A | Discharge status: Home / Self Care | Payer: Medicare HMO | Source: Ambulatory Visit | Attending: Vascular Surgery | Admitting: Vascular Surgery

## 2019-10-25 DIAGNOSIS — K219 Gastro-esophageal reflux disease without esophagitis: Secondary | ICD-10-CM | POA: Diagnosis not present

## 2019-10-25 DIAGNOSIS — E11622 Type 2 diabetes mellitus with other skin ulcer: Secondary | ICD-10-CM | POA: Diagnosis not present

## 2019-10-25 DIAGNOSIS — E785 Hyperlipidemia, unspecified: Secondary | ICD-10-CM | POA: Diagnosis not present

## 2019-10-25 DIAGNOSIS — T8789 Other complications of amputation stump: Secondary | ICD-10-CM | POA: Diagnosis present

## 2019-10-25 DIAGNOSIS — Z888 Allergy status to other drugs, medicaments and biological substances status: Secondary | ICD-10-CM | POA: Diagnosis not present

## 2019-10-25 DIAGNOSIS — Z79899 Other long term (current) drug therapy: Secondary | ICD-10-CM | POA: Diagnosis not present

## 2019-10-25 DIAGNOSIS — T8743 Infection of amputation stump, right lower extremity: Secondary | ICD-10-CM | POA: Diagnosis not present

## 2019-10-25 DIAGNOSIS — E1151 Type 2 diabetes mellitus with diabetic peripheral angiopathy without gangrene: Secondary | ICD-10-CM | POA: Diagnosis not present

## 2019-10-25 DIAGNOSIS — Z7901 Long term (current) use of anticoagulants: Secondary | ICD-10-CM | POA: Diagnosis not present

## 2019-10-25 DIAGNOSIS — Z7984 Long term (current) use of oral hypoglycemic drugs: Secondary | ICD-10-CM | POA: Diagnosis not present

## 2019-10-25 DIAGNOSIS — Z20822 Contact with and (suspected) exposure to covid-19: Secondary | ICD-10-CM | POA: Diagnosis not present

## 2019-10-25 DIAGNOSIS — Z8673 Personal history of transient ischemic attack (TIA), and cerebral infarction without residual deficits: Secondary | ICD-10-CM | POA: Diagnosis not present

## 2019-10-25 DIAGNOSIS — Z7982 Long term (current) use of aspirin: Secondary | ICD-10-CM | POA: Diagnosis not present

## 2019-10-25 DIAGNOSIS — Z87891 Personal history of nicotine dependence: Secondary | ICD-10-CM | POA: Diagnosis not present

## 2019-10-25 DIAGNOSIS — L89893 Pressure ulcer of other site, stage 3: Secondary | ICD-10-CM | POA: Diagnosis present

## 2019-10-25 DIAGNOSIS — E559 Vitamin D deficiency, unspecified: Secondary | ICD-10-CM | POA: Diagnosis not present

## 2019-10-25 DIAGNOSIS — Z89511 Acquired absence of right leg below knee: Secondary | ICD-10-CM | POA: Diagnosis not present

## 2019-10-25 DIAGNOSIS — I2510 Atherosclerotic heart disease of native coronary artery without angina pectoris: Secondary | ICD-10-CM | POA: Diagnosis not present

## 2019-10-25 DIAGNOSIS — I10 Essential (primary) hypertension: Secondary | ICD-10-CM | POA: Diagnosis not present

## 2019-10-25 DIAGNOSIS — Y839 Surgical procedure, unspecified as the cause of abnormal reaction of the patient, or of later complication, without mention of misadventure at the time of the procedure: Secondary | ICD-10-CM | POA: Diagnosis not present

## 2019-10-25 DIAGNOSIS — I480 Paroxysmal atrial fibrillation: Secondary | ICD-10-CM | POA: Diagnosis not present

## 2019-10-25 LAB — TYPE AND SCREEN
ABO/RH(D): B POS
Antibody Screen: NEGATIVE

## 2019-10-25 LAB — PROTIME-INR
INR: 1.2 (ref 0.8–1.2)
Prothrombin Time: 15.5 seconds — ABNORMAL HIGH (ref 11.4–15.2)

## 2019-10-25 LAB — SARS CORONAVIRUS 2 (TAT 6-24 HRS): SARS Coronavirus 2: NEGATIVE

## 2019-10-25 LAB — APTT: aPTT: 74 seconds — ABNORMAL HIGH (ref 24–36)

## 2019-10-26 ENCOUNTER — Ambulatory Visit: Payer: Medicare HMO | Admitting: Anesthesiology

## 2019-10-26 ENCOUNTER — Encounter
Admission: RE | Disposition: A | Discharge status: Home / Self Care | Payer: Self-pay | Source: Home / Self Care | Attending: Vascular Surgery

## 2019-10-26 ENCOUNTER — Observation Stay
Admission: RE | Admit: 2019-10-26 | Discharge: 2019-10-27 | Disposition: A | Discharge status: Home / Self Care | Payer: Medicare HMO | Attending: Vascular Surgery | Admitting: Vascular Surgery

## 2019-10-26 ENCOUNTER — Encounter: Payer: Self-pay | Admitting: Vascular Surgery

## 2019-10-26 ENCOUNTER — Other Ambulatory Visit: Payer: Self-pay

## 2019-10-26 DIAGNOSIS — L89893 Pressure ulcer of other site, stage 3: Secondary | ICD-10-CM | POA: Diagnosis present

## 2019-10-26 DIAGNOSIS — Z20822 Contact with and (suspected) exposure to covid-19: Secondary | ICD-10-CM | POA: Insufficient documentation

## 2019-10-26 DIAGNOSIS — Z79899 Other long term (current) drug therapy: Secondary | ICD-10-CM | POA: Insufficient documentation

## 2019-10-26 DIAGNOSIS — T8743 Infection of amputation stump, right lower extremity: Secondary | ICD-10-CM | POA: Diagnosis not present

## 2019-10-26 DIAGNOSIS — Z87891 Personal history of nicotine dependence: Secondary | ICD-10-CM | POA: Insufficient documentation

## 2019-10-26 DIAGNOSIS — I480 Paroxysmal atrial fibrillation: Secondary | ICD-10-CM | POA: Insufficient documentation

## 2019-10-26 DIAGNOSIS — T8789 Other complications of amputation stump: Secondary | ICD-10-CM | POA: Diagnosis present

## 2019-10-26 DIAGNOSIS — I2510 Atherosclerotic heart disease of native coronary artery without angina pectoris: Secondary | ICD-10-CM | POA: Insufficient documentation

## 2019-10-26 DIAGNOSIS — E559 Vitamin D deficiency, unspecified: Secondary | ICD-10-CM | POA: Insufficient documentation

## 2019-10-26 DIAGNOSIS — Z7982 Long term (current) use of aspirin: Secondary | ICD-10-CM | POA: Insufficient documentation

## 2019-10-26 DIAGNOSIS — E785 Hyperlipidemia, unspecified: Secondary | ICD-10-CM | POA: Diagnosis not present

## 2019-10-26 DIAGNOSIS — Z7901 Long term (current) use of anticoagulants: Secondary | ICD-10-CM | POA: Insufficient documentation

## 2019-10-26 DIAGNOSIS — Z888 Allergy status to other drugs, medicaments and biological substances status: Secondary | ICD-10-CM | POA: Insufficient documentation

## 2019-10-26 DIAGNOSIS — Z8673 Personal history of transient ischemic attack (TIA), and cerebral infarction without residual deficits: Secondary | ICD-10-CM | POA: Insufficient documentation

## 2019-10-26 DIAGNOSIS — E1151 Type 2 diabetes mellitus with diabetic peripheral angiopathy without gangrene: Secondary | ICD-10-CM | POA: Insufficient documentation

## 2019-10-26 DIAGNOSIS — Z7984 Long term (current) use of oral hypoglycemic drugs: Secondary | ICD-10-CM | POA: Insufficient documentation

## 2019-10-26 DIAGNOSIS — I10 Essential (primary) hypertension: Secondary | ICD-10-CM | POA: Insufficient documentation

## 2019-10-26 DIAGNOSIS — Z89511 Acquired absence of right leg below knee: Secondary | ICD-10-CM | POA: Insufficient documentation

## 2019-10-26 DIAGNOSIS — K219 Gastro-esophageal reflux disease without esophagitis: Secondary | ICD-10-CM | POA: Insufficient documentation

## 2019-10-26 DIAGNOSIS — E1140 Type 2 diabetes mellitus with diabetic neuropathy, unspecified: Secondary | ICD-10-CM | POA: Diagnosis not present

## 2019-10-26 DIAGNOSIS — E11622 Type 2 diabetes mellitus with other skin ulcer: Secondary | ICD-10-CM | POA: Insufficient documentation

## 2019-10-26 DIAGNOSIS — Y839 Surgical procedure, unspecified as the cause of abnormal reaction of the patient, or of later complication, without mention of misadventure at the time of the procedure: Secondary | ICD-10-CM | POA: Insufficient documentation

## 2019-10-26 HISTORY — PX: AMPUTATION: SHX166

## 2019-10-26 HISTORY — PX: LEG AMPUTATION: SHX1105

## 2019-10-26 LAB — GLUCOSE, CAPILLARY
Glucose-Capillary: 147 mg/dL — ABNORMAL HIGH (ref 70–99)
Glucose-Capillary: 166 mg/dL — ABNORMAL HIGH (ref 70–99)
Glucose-Capillary: 220 mg/dL — ABNORMAL HIGH (ref 70–99)
Glucose-Capillary: 260 mg/dL — ABNORMAL HIGH (ref 70–99)

## 2019-10-26 LAB — CBC
HCT: 35 % — ABNORMAL LOW (ref 39.0–52.0)
Hemoglobin: 10.7 g/dL — ABNORMAL LOW (ref 13.0–17.0)
MCH: 26.5 pg (ref 26.0–34.0)
MCHC: 30.6 g/dL (ref 30.0–36.0)
MCV: 86.6 fL (ref 80.0–100.0)
Platelets: 175 10*3/uL (ref 150–400)
RBC: 4.04 MIL/uL — ABNORMAL LOW (ref 4.22–5.81)
RDW: 15.4 % (ref 11.5–15.5)
WBC: 10.8 10*3/uL — ABNORMAL HIGH (ref 4.0–10.5)
nRBC: 0 % (ref 0.0–0.2)

## 2019-10-26 LAB — CREATININE, SERUM
Creatinine, Ser: 0.62 mg/dL (ref 0.61–1.24)
GFR calc Af Amer: >60 mL/min (ref >60)
GFR calc non Af Amer: >60 mL/min (ref >60)

## 2019-10-26 LAB — HEMOGLOBIN A1C
Hgb A1c MFr Bld: 7.7 % — ABNORMAL HIGH (ref 4.8–5.6)
Mean Plasma Glucose: 174.29 mg/dL

## 2019-10-26 LAB — HIV ANTIBODY (ROUTINE TESTING W REFLEX): HIV Screen 4th Generation wRfx: NONREACTIVE

## 2019-10-26 SURGERY — AMPUTATION, ABOVE KNEE
Anesthesia: General | Site: Knee | Laterality: Right

## 2019-10-26 MED ORDER — SIMVASTATIN 40 MG PO TABS
40.0000 mg | ORAL_TABLET | Freq: Every day | ORAL | Status: DC
  Administered 2019-10-26: 40 mg via ORAL
  Filled 2019-10-26 (×2): qty 1

## 2019-10-26 MED ORDER — OXYCODONE HCL 5 MG PO TABS
10.0000 mg | ORAL_TABLET | ORAL | Status: DC | PRN
  Administered 2019-10-26 – 2019-10-27 (×2): 10 mg via ORAL
  Filled 2019-10-26 (×2): qty 2

## 2019-10-26 MED ORDER — ACETAMINOPHEN 650 MG RE SUPP: 650.0000 mg | Freq: Four times a day (QID) | RECTAL | Status: DC | PRN

## 2019-10-26 MED ORDER — FENTANYL CITRATE (PF) 100 MCG/2ML IJ SOLN
INTRAMUSCULAR | Status: AC
  Filled 2019-10-26: qty 2

## 2019-10-26 MED ORDER — DEXTROSE-NACL 5-0.9 % IV SOLN: INTRAVENOUS | Status: AC

## 2019-10-26 MED ORDER — PHENYLEPHRINE HCL (PRESSORS) 10 MG/ML IV SOLN
INTRAVENOUS | Status: DC | PRN
  Administered 2019-10-26 (×4): 100 ug via INTRAVENOUS

## 2019-10-26 MED ORDER — ONDANSETRON HCL 4 MG/2ML IJ SOLN: 4.0000 mg | Freq: Four times a day (QID) | INTRAMUSCULAR | Status: DC | PRN

## 2019-10-26 MED ORDER — DOCUSATE SODIUM 100 MG PO CAPS: 100.0000 mg | ORAL_CAPSULE | Freq: Every day | ORAL | Status: DC

## 2019-10-26 MED ORDER — MAGNESIUM HYDROXIDE 400 MG/5ML PO SUSP: 30.0000 mL | Freq: Every day | ORAL | Status: DC | PRN

## 2019-10-26 MED ORDER — ASPIRIN 81 MG PO TBEC: 81.0000 mg | DELAYED_RELEASE_TABLET | Freq: Every day | ORAL | Status: DC

## 2019-10-26 MED ORDER — ACETAMINOPHEN 325 MG PO TABS: 650.0000 mg | ORAL_TABLET | Freq: Four times a day (QID) | ORAL | Status: DC | PRN

## 2019-10-26 MED ORDER — PROPOFOL 10 MG/ML IV BOLUS
INTRAVENOUS | Status: AC
  Filled 2019-10-26: qty 20

## 2019-10-26 MED ORDER — ACETAMINOPHEN 325 MG PO TABS: 325.0000 mg | ORAL_TABLET | ORAL | Status: DC | PRN

## 2019-10-26 MED ORDER — BUPIVACAINE LIPOSOME 1.3 % IJ SUSP
INTRAMUSCULAR | Status: DC | PRN
  Administered 2019-10-26: 20 mL

## 2019-10-26 MED ORDER — BUPIVACAINE HCL 0.5 % IJ SOLN
INTRAMUSCULAR | Status: DC | PRN
  Administered 2019-10-26: 10 mL

## 2019-10-26 MED ORDER — HYDROCHLOROTHIAZIDE 12.5 MG PO CAPS
12.5000 mg | ORAL_CAPSULE | Freq: Every day | ORAL | Status: DC
  Administered 2019-10-27: 12.5 mg via ORAL
  Filled 2019-10-26: qty 1

## 2019-10-26 MED ORDER — PANTOPRAZOLE SODIUM 40 MG PO TBEC
40.0000 mg | DELAYED_RELEASE_TABLET | Freq: Every day | ORAL | Status: DC
  Administered 2019-10-27: 40 mg via ORAL
  Filled 2019-10-26: qty 1

## 2019-10-26 MED ORDER — ACETAMINOPHEN 10 MG/ML IV SOLN
INTRAVENOUS | Status: DC | PRN
  Administered 2019-10-26: 1000 mg via INTRAVENOUS

## 2019-10-26 MED ORDER — HYDROMORPHONE HCL 1 MG/ML IJ SOLN
0.2500 mg | INTRAMUSCULAR | Status: DC | PRN
  Administered 2019-10-26 (×5): 0.25 mg via INTRAVENOUS

## 2019-10-26 MED ORDER — KETOROLAC TROMETHAMINE 15 MG/ML IJ SOLN
15.0000 mg | Freq: Four times a day (QID) | INTRAMUSCULAR | Status: AC
  Administered 2019-10-26 – 2019-10-27 (×4): 15 mg via INTRAVENOUS
  Filled 2019-10-26 (×4): qty 1

## 2019-10-26 MED ORDER — EZETIMIBE-SIMVASTATIN 10-40 MG PO TABS: 1.0000 | ORAL_TABLET | Freq: Every day | ORAL | Status: DC

## 2019-10-26 MED ORDER — ONDANSETRON HCL 4 MG/2ML IJ SOLN
INTRAMUSCULAR | Status: DC | PRN
  Administered 2019-10-26: 4 mg via INTRAVENOUS

## 2019-10-26 MED ORDER — LIDOCAINE HCL (CARDIAC) PF 100 MG/5ML IV SOSY
PREFILLED_SYRINGE | INTRAVENOUS | Status: DC | PRN
  Administered 2019-10-26: 100 mg via INTRAVENOUS

## 2019-10-26 MED ORDER — ASPIRIN EC 81 MG PO TBEC
81.0000 mg | DELAYED_RELEASE_TABLET | Freq: Every day | ORAL | Status: DC
  Administered 2019-10-27: 81 mg via ORAL
  Filled 2019-10-26: qty 1

## 2019-10-26 MED ORDER — OXYCODONE HCL 5 MG/5ML PO SOLN
ORAL | Status: AC
  Filled 2019-10-26: qty 5

## 2019-10-26 MED ORDER — ACETAMINOPHEN 10 MG/ML IV SOLN
INTRAVENOUS | Status: AC
  Filled 2019-10-26: qty 100

## 2019-10-26 MED ORDER — FERROUS SULFATE 325 (65 FE) MG PO TABS
325.0000 mg | ORAL_TABLET | Freq: Two times a day (BID) | ORAL | Status: DC
  Administered 2019-10-26 – 2019-10-27 (×2): 325 mg via ORAL
  Filled 2019-10-26 (×2): qty 1

## 2019-10-26 MED ORDER — EZETIMIBE 10 MG PO TABS
10.0000 mg | ORAL_TABLET | Freq: Every day | ORAL | Status: DC
  Administered 2019-10-26: 10 mg via ORAL
  Filled 2019-10-26 (×2): qty 1

## 2019-10-26 MED ORDER — METOPROLOL TARTRATE 25 MG PO TABS
25.0000 mg | ORAL_TABLET | Freq: Two times a day (BID) | ORAL | Status: DC
  Administered 2019-10-26 – 2019-10-27 (×2): 25 mg via ORAL
  Filled 2019-10-26 (×2): qty 1

## 2019-10-26 MED ORDER — OXYCODONE HCL 5 MG PO TABS: 5.0000 mg | ORAL_TABLET | Freq: Once | ORAL | Status: AC | PRN

## 2019-10-26 MED ORDER — KETOROLAC TROMETHAMINE 30 MG/ML IJ SOLN
INTRAMUSCULAR | Status: AC
  Filled 2019-10-26: qty 1

## 2019-10-26 MED ORDER — PROPOFOL 10 MG/ML IV BOLUS
INTRAVENOUS | Status: DC | PRN
  Administered 2019-10-26: 100 mg via INTRAVENOUS

## 2019-10-26 MED ORDER — ACETAMINOPHEN 160 MG/5ML PO SOLN
325.0000 mg | ORAL | Status: DC | PRN
  Filled 2019-10-26: qty 20.3

## 2019-10-26 MED ORDER — FENTANYL CITRATE (PF) 100 MCG/2ML IJ SOLN
INTRAMUSCULAR | Status: DC | PRN
  Administered 2019-10-26: 25 ug via INTRAVENOUS
  Administered 2019-10-26: 50 ug via INTRAVENOUS
  Administered 2019-10-26: 25 ug via INTRAVENOUS
  Administered 2019-10-26 (×2): 50 ug via INTRAVENOUS

## 2019-10-26 MED ORDER — HYDROCODONE-ACETAMINOPHEN 5-325 MG PO TABS: 1.0000 | ORAL_TABLET | ORAL | Status: DC | PRN

## 2019-10-26 MED ORDER — 0.9 % SODIUM CHLORIDE (POUR BTL) OPTIME
TOPICAL | Status: DC | PRN
  Administered 2019-10-26: 500 mL

## 2019-10-26 MED ORDER — BUPIVACAINE HCL (PF) 0.5 % IJ SOLN
INTRAMUSCULAR | Status: AC
  Filled 2019-10-26: qty 30

## 2019-10-26 MED ORDER — OXYCODONE HCL 5 MG PO TABS: 5.0000 mg | ORAL_TABLET | ORAL | Status: DC | PRN

## 2019-10-26 MED ORDER — KETOROLAC TROMETHAMINE 30 MG/ML IJ SOLN
30.0000 mg | Freq: Once | INTRAMUSCULAR | Status: AC
  Administered 2019-10-26: 30 mg via INTRAVENOUS

## 2019-10-26 MED ORDER — MIDAZOLAM HCL 2 MG/2ML IJ SOLN
INTRAMUSCULAR | Status: DC | PRN
  Administered 2019-10-26: 2 mg via INTRAVENOUS

## 2019-10-26 MED ORDER — BUPIVACAINE LIPOSOME 1.3 % IJ SUSP
INTRAMUSCULAR | Status: AC
  Filled 2019-10-26: qty 20

## 2019-10-26 MED ORDER — ASCORBIC ACID 500 MG PO TABS
500.0000 mg | ORAL_TABLET | Freq: Every day | ORAL | Status: DC
  Administered 2019-10-27: 500 mg via ORAL
  Filled 2019-10-26: qty 1

## 2019-10-26 MED ORDER — HYDROMORPHONE HCL 1 MG/ML IJ SOLN
INTRAMUSCULAR | Status: AC
  Administered 2019-10-26: 0.25 mg via INTRAVENOUS
  Filled 2019-10-26: qty 1

## 2019-10-26 MED ORDER — OXYCODONE HCL 5 MG/5ML PO SOLN
5.0000 mg | Freq: Once | ORAL | Status: AC | PRN
  Administered 2019-10-26: 5 mg via ORAL

## 2019-10-26 MED ORDER — CEFAZOLIN SODIUM-DEXTROSE 2-4 GM/100ML-% IV SOLN
INTRAVENOUS | Status: AC
  Filled 2019-10-26: qty 100

## 2019-10-26 MED ORDER — INSULIN ASPART 100 UNIT/ML ~~LOC~~ SOLN
0.0000 [IU] | Freq: Three times a day (TID) | SUBCUTANEOUS | Status: DC
  Administered 2019-10-26: 5 [IU] via SUBCUTANEOUS
  Administered 2019-10-27: 11 [IU] via SUBCUTANEOUS
  Administered 2019-10-27: 8 [IU] via SUBCUTANEOUS
  Filled 2019-10-26 (×3): qty 1

## 2019-10-26 MED ORDER — HYDROMORPHONE HCL 1 MG/ML IJ SOLN: 1.0000 mg | Freq: Once | INTRAMUSCULAR | Status: DC | PRN

## 2019-10-26 MED ORDER — MIDAZOLAM HCL 2 MG/2ML IJ SOLN
INTRAMUSCULAR | Status: AC
  Filled 2019-10-26: qty 2

## 2019-10-26 MED ORDER — SORBITOL 70 % SOLN
30.0000 mL | Freq: Every day | Status: DC | PRN
  Filled 2019-10-26: qty 30

## 2019-10-26 MED ORDER — ACETAMINOPHEN 500 MG PO TABS: 1000.0000 mg | ORAL_TABLET | Freq: Four times a day (QID) | ORAL | Status: DC | PRN

## 2019-10-26 MED ORDER — EMPAGLIFLOZIN 10 MG PO TABS
5.0000 mg | ORAL_TABLET | Freq: Every day | ORAL | Status: DC
  Filled 2019-10-26: qty 5

## 2019-10-26 MED ORDER — DEXAMETHASONE SODIUM PHOSPHATE 10 MG/ML IJ SOLN
INTRAMUSCULAR | Status: DC | PRN
  Administered 2019-10-26: 10 mg via INTRAVENOUS

## 2019-10-26 MED ORDER — GABAPENTIN 600 MG PO TABS
1200.0000 mg | ORAL_TABLET | Freq: Three times a day (TID) | ORAL | Status: DC
  Administered 2019-10-26 – 2019-10-27 (×3): 1200 mg via ORAL
  Filled 2019-10-26 (×3): qty 2

## 2019-10-26 MED ORDER — OXYCODONE-ACETAMINOPHEN 5-325 MG PO TABS
1.0000 | ORAL_TABLET | Freq: Four times a day (QID) | ORAL | Status: DC | PRN
  Administered 2019-10-26: 1 via ORAL
  Filled 2019-10-26: qty 1

## 2019-10-26 MED ORDER — CHLORHEXIDINE GLUCONATE CLOTH 2 % EX PADS: 6.0000 | MEDICATED_PAD | Freq: Once | CUTANEOUS | Status: DC

## 2019-10-26 MED ORDER — VITAMIN D 25 MCG (1000 UNIT) PO TABS
5000.0000 [IU] | ORAL_TABLET | Freq: Every day | ORAL | Status: DC
  Administered 2019-10-27: 5000 [IU] via ORAL
  Filled 2019-10-26: qty 5

## 2019-10-26 MED ORDER — HEPARIN SODIUM (PORCINE) 5000 UNIT/ML IJ SOLN
5000.0000 [IU] | Freq: Three times a day (TID) | INTRAMUSCULAR | Status: DC
  Administered 2019-10-27 (×2): 5000 [IU] via SUBCUTANEOUS
  Filled 2019-10-26 (×2): qty 1

## 2019-10-26 MED ORDER — HEPARIN SODIUM (PORCINE) 5000 UNIT/ML IJ SOLN: 5000.0000 [IU] | Freq: Three times a day (TID) | INTRAMUSCULAR | Status: DC

## 2019-10-26 MED ORDER — CEFAZOLIN SODIUM-DEXTROSE 2-4 GM/100ML-% IV SOLN: 2.0000 g | INTRAVENOUS | Status: DC

## 2019-10-26 MED ORDER — DOCUSATE SODIUM 100 MG PO CAPS
100.0000 mg | ORAL_CAPSULE | Freq: Two times a day (BID) | ORAL | Status: DC
  Administered 2019-10-26 – 2019-10-27 (×2): 100 mg via ORAL
  Filled 2019-10-26 (×2): qty 1

## 2019-10-26 MED ORDER — SEVOFLURANE IN SOLN
RESPIRATORY_TRACT | Status: AC
  Filled 2019-10-26: qty 250

## 2019-10-26 MED ORDER — MORPHINE SULFATE (PF) 2 MG/ML IV SOLN
2.0000 mg | INTRAVENOUS | Status: DC | PRN
  Administered 2019-10-26 – 2019-10-27 (×3): 2 mg via INTRAVENOUS
  Filled 2019-10-26 (×3): qty 1

## 2019-10-26 MED ORDER — ONDANSETRON HCL 4 MG PO TABS: 4.0000 mg | ORAL_TABLET | Freq: Four times a day (QID) | ORAL | Status: DC | PRN

## 2019-10-26 MED ORDER — METFORMIN HCL 500 MG PO TABS
1000.0000 mg | ORAL_TABLET | Freq: Two times a day (BID) | ORAL | Status: DC
  Administered 2019-10-26 – 2019-10-27 (×2): 1000 mg via ORAL
  Filled 2019-10-26 (×2): qty 2

## 2019-10-26 MED ORDER — HYDROMORPHONE HCL 1 MG/ML IJ SOLN
INTRAMUSCULAR | Status: AC
  Administered 2019-10-26: 0.5 mg via INTRAVENOUS
  Filled 2019-10-26: qty 1

## 2019-10-26 MED ORDER — FLEET ENEMA 7-19 GM/118ML RE ENEM: 1.0000 | ENEMA | Freq: Once | RECTAL | Status: DC | PRN

## 2019-10-26 MED ORDER — PROMETHAZINE HCL 25 MG/ML IJ SOLN: 6.2500 mg | INTRAMUSCULAR | Status: DC | PRN

## 2019-10-26 MED ORDER — ADULT MULTIVITAMIN W/MINERALS CH
1.0000 | ORAL_TABLET | Freq: Every day | ORAL | Status: DC
  Administered 2019-10-27: 11:00:00 1 via ORAL
  Filled 2019-10-26: qty 1

## 2019-10-26 MED ORDER — SODIUM CHLORIDE 0.9 % IV SOLN: INTRAVENOUS | Status: DC

## 2019-10-26 SURGICAL SUPPLY — 39 items
BLADE SAGITTAL WIDE XTHICK NO (BLADE) ×2 IMPLANT
BNDG COHESIVE 4X5 TAN STRL (GAUZE/BANDAGES/DRESSINGS) ×2 IMPLANT
BNDG ELASTIC 6X5.8 VLCR NS LF (GAUZE/BANDAGES/DRESSINGS) ×2 IMPLANT
BNDG GAUZE 4.5X4.1 6PLY STRL (MISCELLANEOUS) ×4 IMPLANT
BRUSH SCRUB EZ  4% CHG (MISCELLANEOUS) ×1
BRUSH SCRUB EZ 4% CHG (MISCELLANEOUS) ×1 IMPLANT
CANISTER SUCT 1200ML W/VALVE (MISCELLANEOUS) ×2 IMPLANT
CHLORAPREP W/TINT 26ML (MISCELLANEOUS) ×2 IMPLANT
COVER WAND RF STERILE (DRAPES) ×2 IMPLANT
DRAPE INCISE IOBAN 66X45 STRL (DRAPES) ×2 IMPLANT
DRAPE INCISE IOBAN 66X60 STRL (DRAPES) ×2 IMPLANT
ELECT CAUTERY BLADE 6.4 (BLADE) ×2 IMPLANT
ELECT REM PT RETURN 9FT ADLT (ELECTROSURGICAL) ×2
ELECTRODE REM PT RTRN 9FT ADLT (ELECTROSURGICAL) ×1 IMPLANT
GAUZE SPONGE 4X4 12PLY STRL (GAUZE/BANDAGES/DRESSINGS) ×1 IMPLANT
GAUZE XEROFORM 1X8 LF (GAUZE/BANDAGES/DRESSINGS) ×4 IMPLANT
GLOVE BIO SURGEON STRL SZ7 (GLOVE) ×4 IMPLANT
GLOVE INDICATOR 7.5 STRL GRN (GLOVE) ×2 IMPLANT
GOWN STRL REUS W/ TWL LRG LVL3 (GOWN DISPOSABLE) ×1 IMPLANT
GOWN STRL REUS W/ TWL XL LVL3 (GOWN DISPOSABLE) ×2 IMPLANT
GOWN STRL REUS W/TWL LRG LVL3 (GOWN DISPOSABLE) ×1
GOWN STRL REUS W/TWL XL LVL3 (GOWN DISPOSABLE) ×2
HANDLE YANKAUER SUCT BULB TIP (MISCELLANEOUS) ×2 IMPLANT
KIT TURNOVER KIT A (KITS) ×2 IMPLANT
LABEL OR SOLS (LABEL) ×2 IMPLANT
NS IRRIG 1000ML POUR BTL (IV SOLUTION) ×2 IMPLANT
PACK EXTREMITY ARMC (MISCELLANEOUS) ×2 IMPLANT
PAD ABD DERMACEA PRESS 5X9 (GAUZE/BANDAGES/DRESSINGS) ×4 IMPLANT
PAD PREP 24X41 OB/GYN DISP (PERSONAL CARE ITEMS) ×2 IMPLANT
SPONGE LAP 18X18 RF (DISPOSABLE) ×4 IMPLANT
STAPLER SKIN PROX 35W (STAPLE) ×2 IMPLANT
STOCKINETTE M/LG 89821 (MISCELLANEOUS) ×2 IMPLANT
SUT SILK 0 (SUTURE) ×1
SUT SILK 0 30XBRD TIE 6 (SUTURE) IMPLANT
SUT SILK 2 0 (SUTURE) ×1
SUT SILK 2 0 SH (SUTURE) ×5 IMPLANT
SUT SILK 2-0 18XBRD TIE 12 (SUTURE) ×1 IMPLANT
SUT VIC AB 0 CT1 36 (SUTURE) ×5 IMPLANT
SUT VIC AB 2-0 CT1 (SUTURE) ×4 IMPLANT

## 2019-10-26 NOTE — Transfer of Care (Signed)
Immediate Anesthesia Transfer of Care Note  Patient: [name].  Procedure(s) Performed: RIGHT AMPUTATION ABOVE KNEE (Right Knee)  Patient Location: PACU  Anesthesia Type:General  Level of Consciousness: sedated  Airway & Oxygen Therapy: Patient Spontanous Breathing and Patient connected to face mask oxygen  Post-op Assessment: Report given to RN and Post -op Vital signs reviewed and stable  Post vital signs: Reviewed and stable  Last Vitals:  Vitals Value Taken Time  BP 110/71 10/26/19 1132  Temp 35.9 C 10/26/19 1132  Pulse 84 10/26/19 1132  Resp 12 10/26/19 1132  SpO2 99 % 10/26/19 1132    Last Pain:  Vitals:   10/26/19 1132  TempSrc:   PainSc: Asleep         Complications: No apparent anesthesia complications

## 2019-10-26 NOTE — H&P (Signed)
[facility] History & Physical Update  The patient was interviewed and re-examined.  The patient's previous History and Physical has been reviewed and is unchanged.  There is no change in the plan of care. We plan to proceed with the scheduled procedure.  Leotis Pain, MD  10/26/2019, 9:34 AM

## 2019-10-26 NOTE — Op Note (Signed)
  [facility]   OPERATIVE NOTE   PROCEDURE:  Right above-the-knee amputation  PRE-OPERATIVE DIAGNOSIS: Right non-healing BKA  POST-OPERATIVE DIAGNOSIS: same as above  SURGEON:  Leotis Pain, MD  ASSISTANT(S): Hezzie Bump, PA-C  ANESTHESIA: general  ESTIMATED BLOOD LOSS: 150 cc  FINDING(S): none  SPECIMEN(S):  Right above-the-knee amputation  INDICATIONS:   Marquavion Zenk. is a 64 y.o. 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  The patient is scheduled for a right above-the-knee amputation.  I discussed in depth with the patient the risks, benefits, and alternatives to this procedure.  The patient is aware that the risk of this operation included but are not limited to:  bleeding, infection, myocardial infarction, stroke, death, failure to heal amputation wound, and possible need for more proximal amputation.  The patient is aware of the risks and agrees proceed forward with the procedure. An assistant was present during the procedure to help facilitate the exposure and expedite the procedure.  DESCRIPTION: After full informed written consent was obtained from the patient, the patient was taken to the operating room, and placed supine upon the operating table.  Prior to induction, the patient received IV antibiotics.  The patient was then prepped and draped in the standard fashion for a right above-the-knee amputation. The assistant provided retraction and mobilization to help facilitate exposure and expedite the procedure throughout the entire procedure.  This included following suture, using retractors, and optimizing lighting. After obtaining adequate anesthesia, the patient was prepped and draped in the standard fashion for a above-the-knee amputation.  I marked out the anterior and posterior flaps for a fish-mouth type of amputation.  I made the incisions for these flaps, and then dissected through the subcutaneous tissue, fascia,  and muscles circumferentially.  I elevated  the periosteal tissue 4-5 cm more proximal than the anterior skin flap.  I then transected the femur with a power saw at this level.  Then I smoothed out the rough edges of the bone.  At this point, the specimen was passed off the field as the above-the-knee amputation.  At this point, I clamped all visibly bleeding arteries and veins using a combination of suture ligation with silk suture and electrocautery.   Bleeding continued to be controlled with electrocautery and suture ligature.  The stump was washed off with sterile normal saline and no further active bleeding was noted.  I reapproximated the anterior and posterior fascia  with interrupted stitches of 0 Vicryl.  This was completed along the entire length of anterior and posterior fascia until there were no more loose space in the fascial line. The subcutaneous tissue was then approximated with 2-0 vicryl sutures. The skin was then  reapproximated with staples.  The stump was washed off and dried.  The incision was dressed with Xeroform and ABD pads, and  then fluffs were applied.  Kerlix was wrapped around the leg and then gently an ACE wrap was applied.  A large Ioban was then placed over the ACE wrap to secure the dressing. The patient was then awakened and take to the recovery room in stable condition.   COMPLICATIONS: none  CONDITION: stable  Leotis Pain  10/26/2019, 11:16 AM   This note was created with Dragon Medical transcription system. Any errors in dictation are purely unintentional.

## 2019-10-26 NOTE — Anesthesia Preprocedure Evaluation (Signed)
Anesthesia Evaluation  Patient identified by MRN, date of birth, ID band Patient awake    Reviewed: Allergy & Precautions, H&P , NPO status , reviewed documented beta blocker date and time   History of Anesthesia Complications (+) history of anesthetic complications  Airway Mallampati: II  TM Distance: >3 FB Neck ROM: full    Dental  (+) Edentulous Upper, Poor Dentition, Chipped, Missing Very poor dentition, few remaining lower teeth. None loose.:   Pulmonary former smoker,    Pulmonary exam normal        Cardiovascular hypertension, + CAD and + Peripheral Vascular Disease  + dysrhythmias  Rhythm:irregular  2019 ECHO Study Conclusions   - Left ventricle: The cavity size was normal. Systolic function was  normal. The estimated ejection fraction was in the range of 60%  to 65%. Wall motion was normal; there were no regional wall  motion abnormalities. Left ventricular diastolic function  parameters were normal.  - Aortic valve: Poorly visualized. grossly appears trileaflet;  normal thickness leaflets.  - Aortic root: The aortic root was mild to moderately dilated, 4.1  cm  - Ascending aorta: The ascending aorta was mildly dilated. 3.5 cm  - Mitral valve: There was mild regurgitation.  - Right ventricle: Systolic function was normal.  - Pulmonary arteries: Systolic pressure was within the normal  range.    Neuro/Psych  Headaches,  Neuromuscular disease CVA    GI/Hepatic GERD  Controlled and Medicated,  Endo/Other  diabetes  Renal/GU Renal disease     Musculoskeletal   Abdominal   Peds  Hematology  (+) Blood dyscrasia, anemia ,   Anesthesia Other Findings Past Medical History: 02/17/2015: Acute left PCA stroke ([place]) No date: Anemia     Comment:  vitamin b and vit d deficiencies No date: Arteriovenous malformation of gastrointestinal tract     Comment:  small bowel 05/08/2018: Atherosclerosis of  native arteries of the extremities with  gangrene ([place]) No date: Atrial fibrillation ([place]) No date: Blind     Comment:  right eye [id number]: Complication of anesthesia     Comment:  disoriented, trying to jump out of bed after bka No date: Coronary artery calcification seen on CT scan     Comment:  a. 08/2018 CT Chest: Mild cor Ca2+. No date: Diabetes mellitus with complication ([place]) No date: Dilated aortic root ([place])     Comment:  a. 04/2018 Echo: 4.1cm. Asc Ao 3.5cm; b. 04/2018 CT: Asc               Ao 3.6cm. 4.1cm @ sinus of Valsalva. No date: Dysrhythmia     Comment:  a fib 06/02/2018: Gangrene of right foot ([place]) No date: GERD (gastroesophageal reflux disease) No date: History of echocardiogram     Comment:  a. 04/2018 Echo: Ef 60-65%, no rwma, midly to mod dil Ao               root - 4.1cm. Asc Ao 3.5cm. Mild MR. Nl RV fxn. Nl PASP. No date: History of hernia repair No date: History of stress test     Comment:  a. 2016 MV ([name]): EF 58%, no ischemia. 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 on Eliquis as of 2018; b.  CHADS2VASc => 5 (HTN, DM,               stroke x 2, vascular disease) No date: Peripheral vascular disease ([place])     Comment:  a. followed by Dr. Lucky Cowboy; b. s/p kissing balloon stents               and right external iliac stent in 10/2017; c. 05/2018 s/p               R BKA; c. 08/2019: Bilat Iliac PTA & DBA or REIA. No date: Pulmonary nodules No date: Stroke [facility])     Comment:  a. 2016 & 2018 07/11/2018: Stroke [facility]) Past Surgical History: No date: ABDOMINAL AORTA STENT     Comment:  x 2 06/02/2018: AMPUTATION; Right     Comment:  Procedure: AMPUTATION BELOW KNEE;  Surgeon: [name], MD;  Location: [facility] ORS;   Service: Vascular;                Laterality: Right; No date: APPENDECTOMY 05/12/2018: COLONOSCOPY WITH PROPOFOL; N/A     Comment:  Procedure: COLONOSCOPY WITH PROPOFOL;  Surgeon: [name], MD;  Location: [facility] ENDOSCOPY;  Service:               Gastroenterology;  Laterality: N/A; 05/12/2018: ESOPHAGOGASTRODUODENOSCOPY (EGD) WITH PROPOFOL; N/A     Comment:  Procedure: ESOPHAGOGASTRODUODENOSCOPY (EGD) WITH               PROPOFOL;  Surgeon: [name], MD;  Location: [facility]               ENDOSCOPY;  Service: Gastroenterology;  Laterality: N/A; 07/13/2018: GIVENS CAPSULE STUDY; N/A     Comment:  Procedure: GIVENS CAPSULE STUDY;  Surgeon: [name],               MD;  Location: [facility] ENDOSCOPY;  Service:               Gastroenterology;  Laterality: N/A; No date: HERNIA REPAIR     Comment:  UMBILICAL [id number]: LOWER EXTREMITY ANGIOGRAPHY; Right     Comment:  Procedure: LOWER EXTREMITY ANGIOGRAPHY;  Surgeon: [name], MD;  Location: [place] CV LAB;  Service:               Cardiovascular;  Laterality: Right; 01/13/2018: LOWER EXTREMITY ANGIOGRAPHY; Right     Comment:  Procedure: LOWER EXTREMITY ANGIOGRAPHY;  Surgeon: [name], MD;  Location: [place] CV LAB;  Service:               Cardiovascular;  Laterality: Right; 08/28/2019: LOWER EXTREMITY ANGIOGRAPHY; Right     Comment:  Procedure: LOWER EXTREMITY ANGIOGRAPHY;  Surgeon: [name], MD;  Location: [place] CV LAB;  Service:               Cardiovascular;  Laterality: Right; 10/25/2017: LOWER EXTREMITY INTERVENTION     Comment:  Procedure: LOWER EXTREMITY INTERVENTION;  Surgeon: [name], MD;  Location: [facility]  INVASIVE CV LAB;  Service:               Cardiovascular;; 12/28/2018: SKIN SPLIT GRAFT; Right     Comment:  Procedure: SKIN GRAFT SPLIT THICKNESS ( SYNTHETIC );                Surgeon: [name], MD;  Location: [facility] ORS;   Service:              Vascular;  Laterality: Right; No date: TONSILLECTOMY 08/08/2018: WOUND DEBRIDEMENT; Right     Comment:  Procedure: DEBRIDEMENT WOUND WITH WOUND VAC APPLICATION;              Surgeon: [name], MD;  Location: [facility] ORS;  Service:              Vascular;  Laterality: Right;   Reproductive/Obstetrics                             Anesthesia Physical Anesthesia Plan  ASA: III  Anesthesia Plan: General   Post-op Pain Management:    Induction: Intravenous  PONV Risk Score and Plan: 2 and Ondansetron and Treatment may vary due to age or medical condition  Airway Management Planned: LMA  Additional Equipment:   Intra-op Plan:   Post-operative Plan: Extubation in OR  Informed Consent: I have reviewed the patients History and Physical, chart, labs and discussed the procedure including the risks, benefits and alternatives for the proposed anesthesia with the patient or authorized representative who has indicated his/her understanding and acceptance.     Dental Advisory Given  Plan Discussed with: CRNA  Anesthesia Plan Comments:         Anesthesia Quick Evaluation

## 2019-10-26 NOTE — Progress Notes (Signed)
Dr. Lavone Neri notified that pt has been given 2mg  Dilaudid and 5mg  Oxycodone. Pt is sleeping and when he wakes up states pain is 10/10. Dr. Lavone Neri states he has nothing else to give pt. Suggested PCA by Dr. Lucky Cowboy. Dr. Lucky Cowboy called and notified of pt pain level. Acknowledged. Orders received.

## 2019-10-26 NOTE — Anesthesia Postprocedure Evaluation (Signed)
Anesthesia Post Note  Patient: [name].  Procedure(s) Performed: RIGHT AMPUTATION ABOVE KNEE (Right Knee)  Patient location during evaluation: PACU Anesthesia Type: General Level of consciousness: awake and alert Pain management: pain level controlled Vital Signs Assessment: post-procedure vital signs reviewed and stable Respiratory status: spontaneous breathing, nonlabored ventilation, respiratory function stable and patient connected to nasal cannula oxygen Cardiovascular status: blood pressure returned to baseline and stable Postop Assessment: no apparent nausea or vomiting Anesthetic complications: no     Last Vitals:  Vitals:   10/26/19 1402 10/26/19 1409  BP: 134/77   Pulse: 74 (!) 101  Resp: 10 (!) 9  Temp:    SpO2: 94% 96%    Last Pain:  Vitals:   10/26/19 1409  TempSrc:   PainSc: 6                  [name]

## 2019-10-27 DIAGNOSIS — T8743 Infection of amputation stump, right lower extremity: Secondary | ICD-10-CM | POA: Diagnosis not present

## 2019-10-27 DIAGNOSIS — L89893 Pressure ulcer of other site, stage 3: Secondary | ICD-10-CM | POA: Diagnosis not present

## 2019-10-27 DIAGNOSIS — T8789 Other complications of amputation stump: Secondary | ICD-10-CM | POA: Diagnosis not present

## 2019-10-27 LAB — BASIC METABOLIC PANEL
Anion gap: 8 (ref 5–15)
BUN: 23 mg/dL (ref 8–23)
CO2: 25 mmol/L (ref 22–32)
Calcium: 8.4 mg/dL — ABNORMAL LOW (ref 8.9–10.3)
Chloride: 103 mmol/L (ref 98–111)
Creatinine, Ser: 0.76 mg/dL (ref 0.61–1.24)
GFR calc Af Amer: >60 mL/min (ref >60)
GFR calc non Af Amer: >60 mL/min (ref >60)
Glucose, Bld: 237 mg/dL — ABNORMAL HIGH (ref 70–99)
Potassium: 4.4 mmol/L (ref 3.5–5.1)
Sodium: 136 mmol/L (ref 135–145)

## 2019-10-27 LAB — CBC
HCT: 33 % — ABNORMAL LOW (ref 39.0–52.0)
Hemoglobin: 10 g/dL — ABNORMAL LOW (ref 13.0–17.0)
MCH: 26.5 pg (ref 26.0–34.0)
MCHC: 30.3 g/dL (ref 30.0–36.0)
MCV: 87.5 fL (ref 80.0–100.0)
Platelets: 198 10*3/uL (ref 150–400)
RBC: 3.77 MIL/uL — ABNORMAL LOW (ref 4.22–5.81)
RDW: 15.5 % (ref 11.5–15.5)
WBC: 10.5 10*3/uL (ref 4.0–10.5)
nRBC: 0 % (ref 0.0–0.2)

## 2019-10-27 LAB — GLUCOSE, CAPILLARY
Glucose-Capillary: 306 mg/dL — ABNORMAL HIGH (ref 70–99)
Glucose-Capillary: 262 mg/dL — ABNORMAL HIGH (ref 70–99)

## 2019-10-27 LAB — MAGNESIUM: Magnesium: 2 mg/dL (ref 1.7–2.4)

## 2019-10-27 MED ORDER — OXYCODONE HCL 5 MG PO TABS: ORAL_TABLET | ORAL | 0 refills | Status: DC

## 2019-10-27 MED ORDER — ASPIRIN 81 MG PO TBEC: 81.0000 mg | DELAYED_RELEASE_TABLET | Freq: Every day | ORAL | 3 refills | Status: DC

## 2019-10-27 MED ORDER — KETOROLAC TROMETHAMINE 15 MG/ML IJ SOLN
15.0000 mg | Freq: Four times a day (QID) | INTRAMUSCULAR | Status: DC
  Administered 2019-10-27: 15 mg via INTRAVENOUS
  Filled 2019-10-27: qty 1

## 2019-10-27 MED ORDER — INSULIN ASPART 100 UNIT/ML ~~LOC~~ SOLN: 0.0000 [IU] | Freq: Three times a day (TID) | SUBCUTANEOUS | Status: DC

## 2019-10-27 MED ORDER — INSULIN ASPART 100 UNIT/ML ~~LOC~~ SOLN: 0.0000 [IU] | Freq: Every day | SUBCUTANEOUS | Status: DC

## 2019-10-27 NOTE — Evaluation (Signed)
Occupational Therapy Evaluation Patient Details Name: [name]. MRN: [id number] DOB: [birth year] Today's Date: 10/27/2019    History of Present Illness Pt is 63 y/o M with PMH: stroke in 2016 and 2018, HLD, PAD, pAF, and blind in R eye. 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 Pt was independent in most BADLs prior to surgery with some occasional assist/supervision from spouse to transfer to shower bench. Pt is eager to return to PLOF with less pain and improved safety and independence. Pt currently requires MOD A with LB dressing while in seated position due to pain and limited balance, completes sup to sit transition with HOB elevated and CGA. Pt instructed in bed mobility, falls prevention strategies, home/routines modifications, and considerations for potentially beneficial AE for LB bathing and dressing tasks. Pt would benefit from skilled OT services including additional instruction in dressing/bathing techniques with or without assistive devices to support recall and carryover prior to discharge and ultimately to maximize safety, independence, and minimize falls risk and caregiver burden. Recommending HHOT at this time to support attaining safest and highest fxl level with ADLs/ADL mobility.     Follow Up Recommendations  Home health OT;Supervision/Assistance - 24 hour    Equipment Recommendations  3 in 1 bedside commode    Recommendations for Other Services       Precautions / Restrictions Precautions Precautions: Fall      Mobility Bed Mobility Overal bed mobility: Needs Assistance Bed Mobility: Supine to Sit     Supine to sit: Supervision;Min guard;HOB elevated     General bed mobility comments: bed rail use  Transfers                 General transfer comment: transfers not assessed  at this time as pt's lunch tray presents after sitting up some time with OT for self care assessment and modification education as well as UE therex.    Balance Overall balance assessment: Needs assistance Sitting-balance support: Single extremity supported Sitting balance-Leahy Scale: Good Sitting balance - Comments: G static sitting balance once asissted to scoot to where hips are squarely positioned. P dyanmic sitting balance.       Standing balance comment: NT                           ADL either performed or assessed with clinical judgement   ADL Overall ADL's : Needs assistance/impaired Eating/Feeding: Set up;Sitting   Grooming: Wash/dry hands;Wash/dry face;Independent;Sitting   Upper Body Bathing: Minimal assistance;Sitting   Lower Body Bathing: Moderate assistance;Sitting/lateral leans Lower Body Bathing Details (indicate cue type and reason): based on clinical observation Upper Body Dressing : Minimal assistance   Lower Body Dressing: Moderate assistance;Sitting/lateral leans Lower Body Dressing Details (indicate cue type and reason): to don socks in sitting. Pt makes good effort to bend at waist to perform. Demos good flexibility, but poor dynamic sitting balance at this time.   Toilet Transfer Details (indicate cue type and reason): transfers not assessed at this time as pt's lunch tray presents once up in sitting.                 Vision Baseline Vision/History: Wears glasses Wears Glasses: At all times Patient Visual Report: No change from baseline Additional Comments: hx: Blind in  R eye     Perception     Praxis      Pertinent Vitals/Pain Pain Assessment: Faces Faces Pain Scale: Hurts even more Pain Location: R residual limb Pain Descriptors / Indicators: Aching;Grimacing;Operative site guarding Pain Intervention(s): Monitored during session;Repositioned     Hand Dominance     Extremity/Trunk Assessment Upper Extremity  Assessment Upper Extremity Assessment: Overall WFL for tasks assessed   Lower Extremity Assessment Lower Extremity Assessment: Defer to PT evaluation;RLE deficits/detail RLE: Unable to fully assess due to pain       Communication Communication Communication: No difficulties   Cognition Arousal/Alertness: Awake/alert Behavior During Therapy: WFL for tasks assessed/performed Overall Cognitive Status: Within Functional Limits for tasks assessed                                 General Comments: some distracted behavior/perseveration on L LE skin abrasion (on shin, appears to be a few days old and healing) requiring gentle re-direction. Appropriate conversationally and oriented to situation. Some confusion re: temporal concepts.   General Comments       Exercises Other Exercises Other Exercises: OT facilitates education re: role of OT in acute setting as well as recommendations for f/u upon d/c. Pt and spouse verbalize good understanding. Other Exercises: OT facilitates pt participation in modified tricep push ups in EOB sitting with MIN verbal cues for form and pace for 2 sets x5 reps with extended time for exercise itself and ~3 min rest break between sets.   Shoulder Instructions      Home Living Family/patient expects to be discharged to:: Private residence Living Arrangements: Spouse/significant other Available Help at Discharge: Family Type of Home: House Home Access: Stairs to enter [employer] of Steps: 4(previously butt scooting up/down) Entrance Stairs-Rails: Right;Left;Can reach both Home Layout: One level     Bathroom Shower/Tub: Occupational psychologist: Standard     Home Equipment: Environmental consultant - 2 wheels;Bedside commode;Crutches;Wheelchair - manual          Prior Functioning/Environment Level of Independence: Needs assistance  Gait / Transfers Assistance Needed: mostly WC in house, performed modI stand pivot transfers adn scoot  transferrs PTA. ADL's / Homemaking Assistance Needed: Pt was able to perform most BADLs with some supv for safety from spouse to t/f into shower. Wife encourages pt to dress himself, reports helping very occasionally only only on severe pain days or if in a hurry to get to an appointment.            OT Problem List: Decreased strength;Decreased range of motion;Decreased activity tolerance;Impaired balance (sitting and/or standing);Decreased coordination;Decreased knowledge of use of DME or AE;Decreased knowledge of precautions;Pain      OT Treatment/Interventions: Self-care/ADL training;Therapeutic exercise;DME and/or AE instruction;Therapeutic activities;Patient/family education;Balance training    OT Goals(Current goals can be found in the care plan section) Acute Rehab OT Goals Patient Stated Goal: Get stronger and go home OT Goal Formulation: With patient/family Time For Goal Achievement: 11/10/19 Potential to Achieve Goals: Good  OT Frequency: Min 2X/week   Barriers to D/C:            Co-evaluation              AM-PAC OT "6 Clicks" Daily Activity     Outcome Measure Help from another person eating meals?: None Help from another person taking care of personal grooming?: A Little Help from another person toileting, which includes using toliet,  bedpan, or urinal?: A Lot Help from another person bathing (including washing, rinsing, drying)?: A Lot Help from another person to put on and taking off regular upper body clothing?: A Little Help from another person to put on and taking off regular lower body clothing?: A Lot 6 Click Score: 16   End of Session    Activity Tolerance: Patient tolerated treatment well Patient left: with call bell/phone within reach;with bed alarm set(sitting EOB to eat lunch with spouse present with alarm set which is relayed to front Conservator, museum/gallery as pt demos G static sitting balance.)  OT Visit Diagnosis: Other abnormalities of gait and  mobility (R26.89);Muscle weakness (generalized) (M62.81)                Time: [id number] OT Time Calculation (min): 43 min Charges:  OT General Charges $OT Visit: 1 Visit OT Evaluation $OT Eval Moderate Complexity: 1 Mod OT Treatments $Self Care/Home Management : 8-22 mins $Therapeutic Activity: 8-22 mins  Gerrianne Scale, MS, OTR/L ascom [phone] 10/27/19, 1:55 PM

## 2019-10-27 NOTE — Discharge Summary (Signed)
[facility]    Discharge Summary  Patient ID:  Glenn Sadd Eldridge Jr. MRN: [id number] DOB/AGE: 02/05/1956 64 y.o.  Admit date: 10/26/2019 Discharge date: 10/27/2019 Date of Surgery: 10/26/2019 Surgeon: Surgeon(s): [name], MD  Admission Diagnosis: Pressure ulcer of BKA stump, stage 3 ([place]) [T87.89, L89.893]  Discharge Diagnoses:  Pressure ulcer of BKA stump, stage 3 ([place]) [T87.89, L89.893]  Secondary Diagnoses: Past Medical History:  Diagnosis Date  . Acute left PCA stroke ([place]) 02/17/2015  . Anemia    vitamin b and vit d deficiencies  . 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 Atherosclerosis of native arteries of the extremities with gangrene ([place]) 05/08/2018  . Atrial fibrillation ([place])   . Blind    right eye  . Complication of anesthesia 2019   disoriented, trying to jump out of bed after bka  . Coronary artery calcification seen on CT scan    a. 08/2018 CT Chest: Mild cor Ca2+.  . Diabetes mellitus with complication ([place])   . Dilated aortic root ([place])    a. 04/2018 Echo: 4.1cm. Asc Ao 3.5cm; b. 04/2018 CT: Asc Ao 3.6cm. 4.1cm @ sinus of Valsalva.  . Dysrhythmia    a fib  . Gangrene of right foot ([place]) 06/02/2018  . GERD (gastroesophageal reflux disease)   . History of echocardiogram    a. 04/2018 Echo: Ef 60-65%, no rwma, midly to mod dil Ao root - 4.1cm. Asc Ao 3.5cm. Mild MR. Nl RV fxn. Nl PASP.  Marland Kitchen History of hernia repair   . History of stress test    a. 2016 MV ([name]): EF 58%, no ischemia.  . Hyperlipidemia   . Hypertension   . Leg pain   . Legally blind    right eye homonymous hemianopsa  . Memory change    d/t strokes  . Necrotic toes ([place]) 02/21/2018  . PAF (paroxysmal atrial fibrillation) ([name])    a. on Eliquis as of 2018; b. CHADS2VASc => 5 (HTN, DM, stroke x 2, vascular disease)  . Peripheral vascular disease ([place])    a. followed by Dr. Lucky Cowboy; b. s/p kissing balloon stents and right external iliac  stent in 10/2017; c. 05/2018 s/p R BKA; c. 08/2019: Bilat Iliac PTA & DBA or REIA.  . Pulmonary nodules   . Stroke [facility])    a. 2016 & 2018  . Stroke ([place]) 07/11/2018   Procedure(s): RIGHT AMPUTATION ABOVE KNEE  Discharged Condition: good  HPI / Hospital Course:  Glenn Macioce. is a 64 y.o. 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  The patient is scheduled for a right above-the-knee amputation.  I discussed in depth with the patient the risks, benefits, and alternatives to this procedure.  The patient is aware that the risk of this operation included but are not limited to:  bleeding, infection, myocardial infarction, stroke, death, failure to heal amputation wound, and possible need for more proximal amputation.  The patient is aware of the risks and agrees proceed forward with the procedure. On 10/26/19, the patient underwent:  Procedure(s): RIGHT AMPUTATION ABOVE KNEE  Of note: The patient refused discharge to rehab after surgery.  Patient preferred to be discharged home postop day #1.  He will be discharged home with occupational therapy, physical therapy and visiting nurse services.  The patient has an appointment on Monday to have his OR dressing removed and his wound examined.  Extubated: POD # 0  Physical exam:  Alert and oriented x3, no acute distress Cardiovascular: Regular rate and rhythm Pulmonary: Clear to auscultation bilaterally Abdomen: Soft, nontender, nondistended, positive bowel sounds GU: Foley removed Vascular:  Right Lower Extremity: Thigh soft.  Our dressing is intact clean and dry.  Labs: As below  Complications: None  Consults: None  Significant Diagnostic Studies: CBC Lab Results  Component Value Date   WBC 10.5 10/27/2019   HGB 10.0 (L) 10/27/2019   HCT 33.0 (L) 10/27/2019   MCV 87.5 10/27/2019   PLT 198 10/27/2019   BMET    Component Value Date/Time   NA 136 10/27/2019 0518   NA 140 10/23/2019 1403   K 4.4  10/27/2019 0518   CL 103 10/27/2019 0518   CO2 25 10/27/2019 0518   GLUCOSE 237 (H) 10/27/2019 0518   BUN 23 10/27/2019 0518   BUN 17 10/23/2019 1403   CREATININE 0.76 10/27/2019 0518   CALCIUM 8.4 (L) 10/27/2019 0518   GFRNONAA >60 10/27/2019 0518   GFRAA >60 10/27/2019 0518   COAG Lab Results  Component Value Date   INR 1.2 10/25/2019   INR 1.2 12/23/2018   INR 1.30 08/01/2018   Disposition:  Discharge to :Home  Allergies as of 10/27/2019      Reactions   Sodium Pentobarbital [pentobarbital] Shortness Of Breath   Lipitor [atorvastatin] Rash      Medication List    STOP taking these medications   Kerlix Bandage Roll Misc   Medigrip Misc   Medihoney Ca Alginate 4"x5" Pads   oxyCODONE-acetaminophen 5-325 MG tablet Commonly known as: PERCOCET/ROXICET     TAKE these medications   acetaminophen 500 MG tablet Commonly known as: TYLENOL Take 1,000 mg by mouth every 6 (six) hours as needed (for pain.).   apixaban 5 MG Tabs tablet Commonly known as: ELIQUIS Take 1 tablet (5 mg total) by mouth 2 (two) times daily.   aspirin 81 MG EC tablet Take 1 tablet (81 mg total) by mouth daily. Swallow whole. What changed: additional instructions   cholecalciferol 25 MCG (1000 UNIT) tablet Commonly known as: VITAMIN D3 Take 5,000 Units by mouth daily.   docusate sodium 100 MG capsule Commonly known as: Colace Take 1 capsule (100 mg total) by mouth daily. Do not take if you have loose stools   empagliflozin 10 MG Tabs tablet Commonly known as: JARDIANCE Take 5 mg by mouth daily.   ezetimibe-simvastatin 10-40 MG tablet Commonly known as: VYTORIN TAKE 1 TABLET BY MOUTH DAILY AT 6 PM.   ferrous sulfate 325 (65 FE) MG tablet Commonly known as: FerrouSul Take 1 tablet (325 mg total) by mouth 2 (two) times daily with a meal.   gabapentin 800 MG tablet Commonly known as: Neurontin Take 1.5 tablets (1,200 mg total) by mouth 3 (three) times daily.   hydrochlorothiazide  12.5 MG capsule Commonly known as: MICROZIDE TAKE 1 CAPSULE BY MOUTH EVERY DAY   lansoprazole 30 MG capsule Commonly known as: PREVACID Take 30 mg by mouth daily at 12 noon.   metFORMIN 1000 MG tablet Commonly known as: GLUCOPHAGE Take 1 tablet (1,000 mg total) by mouth 2 (two) times daily with a meal.   metoprolol tartrate 25 MG tablet Commonly known as: LOPRESSOR Take 1 tablet (25 mg total) by mouth 2 (two) times daily.   multivitamin with minerals Tabs tablet Take 1 tablet by mouth daily.   ONE TOUCH ULTRA TEST test strip Generic drug: glucose blood USE TO TEST BLOOD SUGAR BID   OneTouch  Delica Lancets 99991111 Misc USE AS DIRECTED TO TEST BLOOD SUGAR BID   oxyCODONE 5 MG immediate release tablet Commonly known as: Oxy IR/ROXICODONE One to Two Tabs Every Six Hours As Needed For Pain   vitamin C 500 MG tablet Commonly known as: ASCORBIC ACID Take 500 mg by mouth daily.            Durable Medical Equipment  (From admission, onward)         Start     Ordered   10/27/19 1315  For home use only DME 3 n 1  Once     10/27/19 1314   10/27/19 1314  For home use only DME Walker rolling  Once    Question Answer Comment  Walker: With 5 Inch Wheels   Patient needs a walker to treat with the following condition PAD (peripheral artery disease) ([place])      10/27/19 1314         Verbal and written Discharge instructions given to the patient. Wound care per Discharge AVS Follow-up Information    Kris Hartmann, NP Follow up on 10/30/2019.   Specialty: Vascular Surgery Why: 10/22/19 @ 10:00am to remove your OR dressing.  Contact information: [place] [id number] [phone]          Signed: Sela Hua, PA-C  10/27/2019, 2:43 PM

## 2019-10-27 NOTE — Progress Notes (Signed)
Inpatient Diabetes Program Recommendations  AACE/ADA: New Consensus Statement on Inpatient Glycemic Control (2015)  Target Ranges:  Prepandial:   less than 140 mg/dL      Peak postprandial:   less than 180 mg/dL (1-2 hours)      Critically ill patients:  140 - 180 mg/dL   Lab Results  Component Value Date   GLUCAP 262 (H) 10/27/2019   HGBA1C 7.7 (H) 10/26/2019    Review of Glycemic Control  Diabetes history: type 2 Outpatient Diabetes medications: Jardiance 5 mg daily, Metformin 1000 mg BID Current orders for Inpatient glycemic control: Novolog MODERATE correction scale TID, Metformin 1000 mg BID  Inpatient Diabetes Program Recommendations:   Noted that blood sugars have been greater than 180 mg/dl. If blood sugars continue to be elevated, recommend adding Novolog 4 units TID with meals if patient eats at least 50% of meal.  Harvel Ricks RN BSN CDE Diabetes Coordinator Pager: [phone]  8am-5pm

## 2019-10-27 NOTE — Discharge Instructions (Signed)
Vascular Surgery Discharge Instructions 1) Do not remove your OR dressing.  You have an appointment on Monday in our office to have it removed.

## 2019-10-27 NOTE — Care Management Obs Status (Signed)
[place] NOTIFICATION   Patient Details  Name: [name]. MRN: [id number] Date of Birth: 1955/11/07   Medicare Observation Status Notification Given:  Yes    Beverly Sessions, RN 10/27/2019, 2:11 PM

## 2019-10-27 NOTE — Progress Notes (Signed)
Glenn B Grandpre Jr. to be D/C'd Home per MD order.  Discussed prescriptions and follow up appointments with the patient. Prescriptions given to patient, medication list explained in detail. Pt verbalized understanding.  Allergies as of 10/27/2019      Reactions   Sodium Pentobarbital [pentobarbital] Shortness Of Breath   Lipitor [atorvastatin] Rash      Medication List    STOP taking these medications   Kerlix Bandage Roll Misc   Medigrip Misc   Medihoney Ca Alginate 4"x5" Pads   oxyCODONE-acetaminophen 5-325 MG tablet Commonly known as: PERCOCET/ROXICET     TAKE these medications   acetaminophen 500 MG tablet Commonly known as: TYLENOL Take 1,000 mg by mouth every 6 (six) hours as needed (for pain.).   apixaban 5 MG Tabs tablet Commonly known as: ELIQUIS Take 1 tablet (5 mg total) by mouth 2 (two) times daily.   aspirin 81 MG EC tablet Take 1 tablet (81 mg total) by mouth daily. Swallow whole. What changed: additional instructions   cholecalciferol 25 MCG (1000 UNIT) tablet Commonly known as: VITAMIN D3 Take 5,000 Units by mouth daily.   docusate sodium 100 MG capsule Commonly known as: Colace Take 1 capsule (100 mg total) by mouth daily. Do not take if you have loose stools   empagliflozin 10 MG Tabs tablet Commonly known as: JARDIANCE Take 5 mg by mouth daily.   ezetimibe-simvastatin 10-40 MG tablet Commonly known as: VYTORIN TAKE 1 TABLET BY MOUTH DAILY AT 6 PM.   ferrous sulfate 325 (65 FE) MG tablet Commonly known as: FerrouSul Take 1 tablet (325 mg total) by mouth 2 (two) times daily with a meal.   gabapentin 800 MG tablet Commonly known as: Neurontin Take 1.5 tablets (1,200 mg total) by mouth 3 (three) times daily.   hydrochlorothiazide 12.5 MG capsule Commonly known as: MICROZIDE TAKE 1 CAPSULE BY MOUTH EVERY DAY   lansoprazole 30 MG capsule Commonly known as: PREVACID Take 30 mg by mouth daily at 12 noon.   metFORMIN 1000 MG tablet Commonly  known as: GLUCOPHAGE Take 1 tablet (1,000 mg total) by mouth 2 (two) times daily with a meal.   metoprolol tartrate 25 MG tablet Commonly known as: LOPRESSOR Take 1 tablet (25 mg total) by mouth 2 (two) times daily.   multivitamin with minerals Tabs tablet Take 1 tablet by mouth daily.   ONE TOUCH ULTRA TEST test strip Generic drug: glucose blood USE TO TEST BLOOD SUGAR BID   OneTouch Delica Lancets 99991111 Misc USE AS DIRECTED TO TEST BLOOD SUGAR BID   oxyCODONE 5 MG immediate release tablet Commonly known as: Oxy IR/ROXICODONE One to Two Tabs Every Six Hours As Needed For Pain   vitamin C 500 MG tablet Commonly known as: ASCORBIC ACID Take 500 mg by mouth daily.            Durable Medical Equipment  (From admission, onward)         Start     Ordered   10/27/19 1315  For home use only DME 3 n 1  Once     10/27/19 1314   10/27/19 1314  For home use only DME Walker rolling  Once    Question Answer Comment  Walker: With 5 Inch Wheels   Patient needs a walker to treat with the following condition PAD (peripheral artery disease) ([place])      10/27/19 1314          Vitals:   10/27/19 0800 10/27/19 1146  BP:  105/61 115/64  Pulse: 96 61  Resp: 18 16  Temp: 98.7 F (37.1 C) 98.7 F (37.1 C)  SpO2: 97% 98%    Skin clean, dry and intact without evidence of skin break down, no evidence of skin tears noted. IV catheter discontinued intact. Site without signs and symptoms of complications. Dressing and pressure applied. Pt denies pain at this time. No complaints noted.  An After Visit Summary was printed and given to the patient. Patient escorted via [place], and D/C home via private auto.  Fuller Mandril, RN

## 2019-10-27 NOTE — Evaluation (Signed)
Physical Therapy Evaluation Patient Details Name: [name]. MRN: [id number] DOB: 05-03-1956 Today's Date: 10/27/2019   History of Present Illness  [name]. 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  Clinical Impression  Pt admitted with above diagnosis. Pt currently with functional limitations due to the deficits listed below (see "PT Problem List"). Upon entry, pt in bed, awake and agreeable to participate. Pain more recently 5-6/10. The pt is alert and oriented x4, pleasant, conversational, and generally a fair historian, but has obvious word finding difficulty and word choice errors throughout, unclear if these are baseline or acute (pt denies). Pt also intermittently disoriented to locations several times, blending home with hospital several times, cuing author to go back into hallway to inspect his back porch entry. Pt performs all mobility at supervision level, but fatigues quickly in gait. Pt/wife able to describe baseline method for navigating stairs, suspect this would be the best method to enter based on evaluation. Functional mobility assessment demonstrates increased effort/time requirements, poor tolerance, but no need for physical assistance, whereas the patient performed these at a higher level of independence PTA. Pt has decreased stamina, will need more WC mobility initially at DC. Pt also somewhat confused this date, but not beyond the point where wife can provide assist and safety. Pt will benefit from skilled PT intervention to increase independence and safety with basic mobility in preparation for discharge to the venue listed below.       Follow Up Recommendations Home health PT;Supervision for mobility/OOB    Equipment Recommendations  None recommended by PT    Recommendations for Other Services       Precautions / Restrictions Precautions Precautions:  Fall Restrictions Weight Bearing Restrictions: Yes RLE Weight Bearing: Non weight bearing      Mobility  Bed Mobility Overal bed mobility: Needs Assistance Bed Mobility: Supine to Sit;Sit to Supine     Supine to sit: Supervision Sit to supine: Supervision   General bed mobility comments: bed rail use  Transfers Overall transfer level: Needs assistance Equipment used: Rolling walker (2 wheeled) Transfers: Sit to/from Stand Sit to Stand: Supervision         General transfer comment: heavy effort, but no LOB  Ambulation/Gait   Gait Distance (Feet): 10 Feet Assistive device: Rolling walker (2 wheeled) Gait Pattern/deviations: WFL(Within Functional Limits)     General Gait Details: 2-point hop-to gait, AMB NWB RLE. Pt fatigued easiy, SOB.  Stairs            Wheelchair Mobility    Modified Rankin (Stroke Patients Only)       Balance Overall balance assessment: Modified Independent Sitting-balance support: Single extremity supported Sitting balance-Leahy Scale: Good Sitting balance - Comments: G static sitting balance once asissted to scoot to where hips are squarely positioned. P dyanmic sitting balance.       Standing balance comment: NT                             Pertinent Vitals/Pain Pain Assessment: 0-10 Pain Score: 5  Faces Pain Scale: Hurts even more Pain Location: Rt residual limb Pain Descriptors / Indicators: Aching;Grimacing;Operative site guarding Pain Intervention(s): Limited activity within patient's tolerance;Premedicated before session;Repositioned    Home Living Family/patient expects to be discharged to:: Private residence Living Arrangements: Spouse/significant other Available Help at  Discharge: Family Type of Home: House Home Access: Stairs to enter Entrance Stairs-Rails: Right;Left;Can reach both Entrance Stairs-Number of Steps: 4(previously butt scooting up/down) Home Layout: One level Home Equipment: Walker -  2 wheels;Bedside commode;Crutches;Wheelchair - manual      Prior Function Level of Independence: Needs assistance   Gait / Transfers Assistance Needed: mostly WC in house, performed modI stand pivot transfers adn scoot transferrs PTA.  ADL's / Homemaking Assistance Needed: Pt was able to perform most BADLs with some supv for safety from spouse to t/f into shower. Wife encourages pt to dress himself, reports helping very occasionally only only on severe pain days or if in a hurry to get to an appointment.        Hand Dominance        Extremity/Trunk Assessment   Upper Extremity Assessment Upper Extremity Assessment: Overall WFL for tasks assessed    Lower Extremity Assessment Lower Extremity Assessment: Overall WFL for tasks assessed RLE: Unable to fully assess due to pain       Communication   Communication: No difficulties  Cognition Arousal/Alertness: Awake/alert Behavior During Therapy: WFL for tasks assessed/performed Overall Cognitive Status: Difficult to assess                                 General Comments: intermittently confused with orientation blendning orientation with home/hospital. havign soem word finding difficulty/error choice; wife correcting intermittently adn redirecting. Pt denies any speech difficulty now or past.      General Comments      Exercises Other Exercises Other Exercises: OT facilitates education re: role of OT in acute setting as well as recommendations for f/u upon d/c. Pt and spouse verbalize good understanding. Other Exercises: OT facilitates pt participation in modified tricep push ups in EOB sitting with MIN verbal cues for form and pace for 2 sets x5 reps with extended time for exercise itself and ~3 min rest break between sets.   Assessment/Plan    PT Assessment Patient needs continued PT services  PT Problem List Decreased strength;Decreased activity tolerance;Decreased balance;Decreased mobility       PT  Treatment Interventions DME instruction;Balance training;Gait training;Stair training;Functional mobility training;Therapeutic activities;Therapeutic exercise;Patient/family education    PT Goals (Current goals can be found in the Care Plan section)  Acute Rehab PT Goals Patient Stated Goal: Get stronger and go home PT Goal Formulation: With patient Time For Goal Achievement: 11/10/19 Potential to Achieve Goals: Good    Frequency 7X/week   Barriers to discharge        Co-evaluation               AM-PAC PT "6 Clicks" Mobility  Outcome Measure Help needed turning from your back to your side while in a flat bed without using bedrails?: None Help needed moving from lying on your back to sitting on the side of a flat bed without using bedrails?: None Help needed moving to and from a bed to a chair (including a wheelchair)?: A Little Help needed standing up from a chair using your arms (e.g., wheelchair or bedside chair)?: A Little Help needed to walk in hospital room?: A Little Help needed climbing 3-5 steps with a railing? : A Lot 6 Click Score: 19    End of Session Equipment Utilized During Treatment: Gait belt Activity Tolerance: Patient tolerated treatment well;Patient limited by fatigue;Patient limited by pain Patient left: in bed;with family/visitor present;with call bell/phone within reach  Nurse Communication: Mobility status PT Visit Diagnosis: Difficulty in walking, not elsewhere classified (R26.2);Other abnormalities of gait and mobility (R26.89);Muscle weakness (generalized) (M62.81)    Time: [id number] PT Time Calculation (min) (ACUTE ONLY): 20 min   Charges:   PT Evaluation $PT Eval Moderate Complexity: 1 Mod PT Treatments $Therapeutic Exercise: 8-22 mins        2:44 PM, 10/27/19 Etta Grandchild, PT, DPT Physical Therapist - [facility]  [phone] ([place])   [place] [facility] 10/27/2019, 2:30 PM

## 2019-10-27 NOTE — TOC Initial Note (Signed)
Transition of Care (TOC) - Initial/Assessment Note    Patient Details  Name: [name]. MRN: [id number] Date of Birth: 03-01-1956  Transition of Care [facility]) CM/SW Contact:    Beverly Sessions, RN Phone Number: 10/27/2019, 2:18 PM  Clinical Narrative:                 Patient lives at home with wife.  Wife at bedside  Plan for discharge home today PCP Wynetta Emery - wife provides transportation [facility] issues [facility]  Patient has BSC at home, and an older walker.   Patient agreeable to home health services. [facility] preference of agency.  They are unable to accept referral at this time.  Patient and wife notified.  They state if [facility] is unable to accept they do not have a preference of agency.  Referral made and accepted by [place] with [facility].   Brad with [employer] to deliver RW to room prior to [place].   Expected Discharge Plan: [place] Barriers to Discharge: No Barriers Identified   Patient Goals and CMS Choice     Choice offered to / list presented to : Patient, Spouse  Expected Discharge Plan and Services Expected Discharge Plan: [place]   Discharge Planning Services: CM Consult Post Acute Care Choice: [place] arrangements for the past 2 months: Single Family Home                 DME Arranged: [place]ford Rile DME Agency: [facility] Date DME Agency Contacted: 10/27/19   Representative spoke with at DME Agency: [place]: OT [place]: [facility] [place] Date [place]: 10/27/19 Time [place]: 75 Representative spoke with at [place]: [place]  Prior Living Arrangements/Services Living arrangements for the past 2 months: [place] with:: Spouse Patient language and need for interpreter reviewed:: Yes Do you feel safe going back to the place where you live?: Yes      Need for Family Participation in Patient Care: Yes  (Comment) Care giver support system in place?: Yes (comment) Current home services: DME Criminal Activity/Legal Involvement Pertinent to Current Situation/Hospitalization: No - Comment as needed  Activities of Daily Living Home Assistive Devices/Equipment: Eyeglasses, Environmental consultant (specify type), Wheelchair, Shower chair without back, CBG Meter, Blood pressure cuff, Dentures (specify type), Bedside commode/3-in-1 ADL Screening (condition at time of admission) Patient's cognitive ability adequate to safely complete daily activities?: Yes Is the patient deaf or have difficulty hearing?: No Does the patient have difficulty seeing, even when wearing glasses/contacts?: No Does the patient have difficulty concentrating, remembering, or making decisions?: No Patient able to express need for assistance with ADLs?: Yes Does the patient have difficulty dressing or bathing?: Yes Independently performs ADLs?: Yes (appropriate for developmental age) Does the patient have difficulty walking or climbing stairs?: Yes Weakness of Legs: Both Weakness of Arms/Hands: Both  Permission Sought/Granted                  Emotional Assessment Appearance:: Appears stated age     Orientation: : Oriented to Self, Oriented to Place, Oriented to  Time, Oriented to Situation   Psych Involvement: No (comment)  Admission diagnosis:  Pressure ulcer of BKA stump, stage 3 ([place]) [T87.89, L89.893] Patient Active Problem List   Diagnosis Date Noted  . Pressure ulcer of BKA stump, stage 3 ([place]) 10/26/2019  . Pressure ulcer of right calf, unstageable ([place]) 07/17/2019  . Fungal  dermatitis 04/13/2019  . Leg edema, right 03/23/2019  . Acute right hemiparesis ([place]) 09/26/2018  . Lung nodule 08/15/2018  . Phantom pain ([place]) 08/14/2018  . Neurogenic pain 08/10/2018  . Stump pain ([place]) (Right) 07/20/2018  . Amputation stump infection ([place]) 07/20/2018  . Chronic anticoagulation (ELIQUIS) 07/20/2018  . Vitamin D  insufficiency 07/18/2018  . Elevated sed rate 07/18/2018  . Chronic pain of right lower extremity 07/11/2018  . Chronic pain syndrome 07/11/2018  . Pharmacologic therapy 07/11/2018  . Hx of BKA, right ([place]) 06/16/2018  . S/P bilateral BKA (below knee amputation) ([place]) 06/11/2018  . Iron deficiency anemia 05/11/2018  . A-fib ([place]) 08/20/2017  . PVD (peripheral vascular disease) ([place]) 07/15/2017  . 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 Memory loss 09/27/2015  . Alcoholic peripheral neuropathy ([place]) 05/16/2015  . Diabetic peripheral neuropathy ([place]) 05/16/2015  . History of stroke with residual deficit 04/01/2015  . Chronic headaches 03/15/2015  . Right homonymous hemianopsia 02/17/2015  . History of tobacco abuse 01/24/2015  . Hyperlipidemia 01/21/2015  . Benign hypertensive renal disease 01/21/2015   PCP:  [name], DO Pharmacy:   [id number] [place], [address] [place] [address] Phone: [phone] Fax: [phone]  [facility] [place] [place] [facility] [place] [address] Phone: [phone] Fax: [phone]     Social Determinants of Health (SDOH) Interventions    Readmission Risk Interventions No flowsheet data found.

## 2019-10-30 ENCOUNTER — Encounter (INDEPENDENT_AMBULATORY_CARE_PROVIDER_SITE_OTHER): Payer: Self-pay | Admitting: Nurse Practitioner

## 2019-10-30 ENCOUNTER — Ambulatory Visit (INDEPENDENT_AMBULATORY_CARE_PROVIDER_SITE_OTHER): Payer: Medicare HMO | Admitting: Nurse Practitioner

## 2019-10-30 ENCOUNTER — Other Ambulatory Visit: Payer: Self-pay

## 2019-10-30 VITALS — BP 121/78 | HR 91 | Resp 16

## 2019-10-30 DIAGNOSIS — T8789 Other complications of amputation stump: Secondary | ICD-10-CM | POA: Diagnosis not present

## 2019-10-30 DIAGNOSIS — Z89511 Acquired absence of right leg below knee: Secondary | ICD-10-CM | POA: Diagnosis not present

## 2019-10-30 DIAGNOSIS — Z4801 Encounter for change or removal of surgical wound dressing: Secondary | ICD-10-CM | POA: Diagnosis not present

## 2019-10-30 DIAGNOSIS — S98921A Partial traumatic amputation of right foot, level unspecified, initial encounter: Secondary | ICD-10-CM | POA: Diagnosis not present

## 2019-10-30 DIAGNOSIS — S78111A Complete traumatic amputation at level between right hip and knee, initial encounter: Secondary | ICD-10-CM

## 2019-10-30 LAB — SURGICAL PATHOLOGY

## 2019-10-30 NOTE — Progress Notes (Signed)
Subjective:    Patient ID: [name]., male    DOB: 1956/05/13, 64 y.o.   MRN: [id number] Chief Complaint  Patient presents with  . Follow-up    dressing change    Patient returns today for postoperative evaluation of right above-knee amputation.  The patient had previously had a below-knee amputation that was having difficulties with healing.  Despite multiple wound care treatments through different facilities and different modalities the patient's below-knee amputation would not heal for well over 16 months.  This revision was done in the hope that his amputation would heal.  The patient also recently had a right lower extremity angiogram to ensure that he had adequate blood flow for healing.  Today the wound is clean dry and intact.  There is some serosanguineous drainage as well as some swelling which is all to be expected.  The patient does endorse having some pain as well.   Review of Systems  Musculoskeletal:       Stump pain  Skin: Positive for wound.  Psychiatric/Behavioral: Positive for confusion.  All other systems reviewed and are negative.      Objective:   Physical Exam Constitutional:      Appearance: Normal appearance.  Musculoskeletal:     Comments: Incision clean dry and intact.  No open areas     Right Lower Extremity: Right leg is amputated above knee.  Skin:    General: Skin is warm.  Neurological:     Mental Status: He is alert. Mental status is at baseline.       BP 121/78 (BP Location: Left Arm)   Pulse 91   Resp 16   Past Medical History:  Diagnosis Date  . Acute left PCA stroke ([place]) 02/17/2015  . Anemia    vitamin b and vit d deficiencies  . 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 Atherosclerosis of native arteries of the extremities with gangrene ([place]) 05/08/2018  . Atrial fibrillation ([place])   . Blind    right eye  . Complication of anesthesia 2019   disoriented, trying to jump out of bed after  bka  . Coronary artery calcification seen on CT scan    a. 08/2018 CT Chest: Mild cor Ca2+.  . Diabetes mellitus with complication ([place])   . Dilated aortic root ([place])    a. 04/2018 Echo: 4.1cm. Asc Ao 3.5cm; b. 04/2018 CT: Asc Ao 3.6cm. 4.1cm @ sinus of Valsalva.  . Dysrhythmia    a fib  . Gangrene of right foot ([place]) 06/02/2018  . GERD (gastroesophageal reflux disease)   . History of echocardiogram    a. 04/2018 Echo: Ef 60-65%, no rwma, midly to mod dil Ao root - 4.1cm. Asc Ao 3.5cm. Mild MR. Nl RV fxn. Nl PASP.  Marland Kitchen History of hernia repair   . History of stress test    a. 2016 MV ([name]): EF 58%, no ischemia.  . Hyperlipidemia   . Hypertension   . Leg pain   . Legally blind    right eye homonymous hemianopsa  . Memory change    d/t strokes  . Necrotic toes ([place]) 02/21/2018  . PAF (paroxysmal atrial fibrillation) ([name])    a. on Eliquis as of 2018; b. CHADS2VASc => 5 (HTN, DM, stroke x 2, vascular disease)  . Peripheral vascular disease ([place])    a. followed by Dr. Lucky Cowboy; b. s/p kissing balloon stents and right external iliac stent in 10/2017; c. 05/2018 s/p R  BKA; c. 08/2019: Bilat Iliac PTA & DBA or REIA.  . Pulmonary nodules   . Stroke [facility])    a. 2016 & 2018  . Stroke [facility]) 07/11/2018    Social History   Socioeconomic History  . Marital status: Married    Spouse name: debra  . Number of children: 0  . Years of education: Not on file  . Highest education level: Associate degree: academic program  Occupational History    Comment: disabled  Tobacco Use  . Smoking status: Former Smoker    Packs/day: 1.00    Years: 30.00    Pack years: 30.00    Types: Cigarettes    Quit date: 01/09/2018    Years since quitting: 1.8  . Smokeless tobacco: Never Used  Substance and Sexual Activity  . Alcohol use: Yes    Alcohol/week: 0.0 standard drinks    Comment: occasionally beer   . Drug use: No  . Sexual activity: Yes  Other Topics Concern  . Not on file  Social History Narrative    Lives with wife in a ranch style house.  Uses a walker or a wheelchair to get around.   Social Determinants of Health   Financial Resource Strain:   . Difficulty of Paying Living Expenses:   Food Insecurity:   . Worried About Charity fundraiser in the Last Year:   . Arboriculturist in the Last Year:   Transportation Needs:   . Film/video editor (Medical):   Marland Kitchen Lack of Transportation (Non-Medical):   Physical Activity:   . Days of Exercise per Week:   . Minutes of Exercise per Session:   Stress:   . Feeling of Stress :   Social Connections:   . Frequency of Communication with Friends and Family:   . Frequency of Social Gatherings with Friends and Family:   . Attends Religious Services:   . Active Member of Clubs or Organizations:   . Attends Archivist Meetings:   Marland Kitchen Marital Status:   Intimate Partner Violence:   . Fear of Current or Ex-Partner:   . Emotionally Abused:   Marland Kitchen Physically Abused:   . Sexually Abused:     Past Surgical History:  Procedure Laterality Date  . ABDOMINAL AORTA STENT     x 2  . AMPUTATION Right 06/02/2018   Procedure: AMPUTATION BELOW KNEE;  Surgeon: [name], MD;  Location: [facility] ORS;  Service: Vascular;  Laterality: Right;  . AMPUTATION Right 10/26/2019   Procedure: RIGHT AMPUTATION ABOVE KNEE;  Surgeon: [name], MD;  Location: [facility] ORS;  Service: Vascular;  Laterality: Right;  . APPENDECTOMY    . COLONOSCOPY WITH PROPOFOL N/A 05/12/2018   Procedure: COLONOSCOPY WITH PROPOFOL;  Surgeon: [name], MD;  Location: [facility] ENDOSCOPY;  Service: Gastroenterology;  Laterality: N/A;  . ESOPHAGOGASTRODUODENOSCOPY (EGD) WITH PROPOFOL N/A 05/12/2018   Procedure: ESOPHAGOGASTRODUODENOSCOPY (EGD) WITH PROPOFOL;  Surgeon: [name], MD;  Location: [facility] ENDOSCOPY;  Service: Gastroenterology;  Laterality: N/A;  . GIVENS CAPSULE STUDY N/A 07/13/2018   Procedure: GIVENS CAPSULE STUDY;  Surgeon: [name], MD;  Location: [facility] ENDOSCOPY;   Service: Gastroenterology;  Laterality: N/A;  . HERNIA REPAIR     UMBILICAL  . LOWER EXTREMITY ANGIOGRAPHY Right 10/25/2017   Procedure: LOWER EXTREMITY ANGIOGRAPHY;  Surgeon: [name], MD;  Location: [facility] CV LAB;  Service: Cardiovascular;  Laterality: Right;  . LOWER EXTREMITY ANGIOGRAPHY Right 01/13/2018   Procedure: LOWER EXTREMITY ANGIOGRAPHY;  Surgeon: Lucky Cowboy,  Erskine Squibb, MD;  Location: [place] CV LAB;  Service: Cardiovascular;  Laterality: Right;  . LOWER EXTREMITY ANGIOGRAPHY Right 08/28/2019   Procedure: LOWER EXTREMITY ANGIOGRAPHY;  Surgeon: [name], MD;  Location: [place] CV LAB;  Service: Cardiovascular;  Laterality: Right;  . LOWER EXTREMITY INTERVENTION  10/25/2017   Procedure: LOWER EXTREMITY INTERVENTION;  Surgeon: [name], MD;  Location: [place] CV LAB;  Service: Cardiovascular;;  . SKIN SPLIT GRAFT Right 12/28/2018   Procedure: SKIN GRAFT SPLIT THICKNESS ( SYNTHETIC );  Surgeon: [name], MD;  Location: [facility] ORS;  Service: Vascular;  Laterality: Right;  . TONSILLECTOMY    . WOUND DEBRIDEMENT Right 08/08/2018   Procedure: DEBRIDEMENT WOUND WITH WOUND VAC APPLICATION;  Surgeon: [name], MD;  Location: [facility] ORS;  Service: Vascular;  Laterality: Right;    Family History  Problem Relation Age of Onset  . Diabetes Father   . Hypertension Father   . Hyperlipidemia Father     Allergies  Allergen Reactions  . Sodium Pentobarbital [Pentobarbital] Shortness Of Breath  . Lipitor [Atorvastatin] Rash       Assessment & Plan:   1. Above knee amputation of right lower extremity [facility]) Patient had recent revision of the right lower extremity below-knee amputation to above-knee amputation.  Today the wound looks good.  There is some swelling which is to be expected.  Discussed wound care with the patient and wife.  Patient has nursing care that will be coming to the home soon.  We will have the patient return to the office in 3 weeks to begin  staple removal.  They are advised to contact her office sooner if issues should arise.  Patient to continue anticoagulation.   Current Outpatient Medications on File Prior to Visit  Medication Sig Dispense Refill  . acetaminophen (TYLENOL) 500 MG tablet Take 1,000 mg by mouth every 6 (six) hours as needed (for pain.).    Marland Kitchen apixaban (ELIQUIS) 5 MG TABS tablet Take 1 tablet (5 mg total) by mouth 2 (two) times daily. 180 tablet 1  . aspirin 81 MG EC tablet Take 1 tablet (81 mg total) by mouth daily. Swallow whole. 90 tablet 3  . cholecalciferol (VITAMIN D3) 25 MCG (1000 UT) tablet Take 5,000 Units by mouth daily.     Marland Kitchen docusate sodium (COLACE) 100 MG capsule Take 1 capsule (100 mg total) by mouth daily. Do not take if you have loose stools 90 capsule 2  . empagliflozin (JARDIANCE) 10 MG TABS tablet Take 5 mg by mouth daily. 90 tablet 1  . ezetimibe-simvastatin (VYTORIN) 10-40 MG tablet TAKE 1 TABLET BY MOUTH DAILY AT 6 PM. 90 tablet 0  . ferrous sulfate (FERROUSUL) 325 (65 FE) MG tablet Take 1 tablet (325 mg total) by mouth 2 (two) times daily with a meal. 120 tablet 3  . gabapentin (NEURONTIN) 800 MG tablet Take 1.5 tablets (1,200 mg total) by mouth 3 (three) times daily. 405 tablet 1  . hydrochlorothiazide (MICROZIDE) 12.5 MG capsule TAKE 1 CAPSULE BY MOUTH EVERY DAY 90 capsule 1  . lansoprazole (PREVACID) 30 MG capsule Take 30 mg by mouth daily at 12 noon.    . metFORMIN (GLUCOPHAGE) 1000 MG tablet Take 1 tablet (1,000 mg total) by mouth 2 (two) times daily with a meal. 180 tablet 3  . metoprolol tartrate (LOPRESSOR) 25 MG tablet Take 1 tablet (25 mg total) by mouth 2 (two) times daily. 180 tablet 3  . Multiple Vitamin (MULTIVITAMIN WITH  MINERALS) TABS tablet Take 1 tablet by mouth daily. 30 tablet 0  . ONE TOUCH ULTRA TEST test strip USE TO TEST BLOOD SUGAR BID 100 each 12  . ONETOUCH DELICA LANCETS 99991111 MISC USE AS DIRECTED TO TEST BLOOD SUGAR BID 100 each 12  . oxyCODONE (OXY IR/ROXICODONE) 5  MG immediate release tablet One to Two Tabs Every Six Hours As Needed For Pain 50 tablet 0  . vitamin C (ASCORBIC ACID) 500 MG tablet Take 500 mg by mouth daily.     No current facility-administered medications on file prior to visit.    There are no Patient Instructions on file for this visit. No follow-ups on file.   Kris Hartmann, NP

## 2019-10-31 DIAGNOSIS — I10 Essential (primary) hypertension: Secondary | ICD-10-CM | POA: Diagnosis not present

## 2019-10-31 DIAGNOSIS — D509 Iron deficiency anemia, unspecified: Secondary | ICD-10-CM | POA: Diagnosis not present

## 2019-10-31 DIAGNOSIS — Z4801 Encounter for change or removal of surgical wound dressing: Secondary | ICD-10-CM | POA: Diagnosis not present

## 2019-10-31 DIAGNOSIS — Z89511 Acquired absence of right leg below knee: Secondary | ICD-10-CM | POA: Diagnosis not present

## 2019-10-31 DIAGNOSIS — E559 Vitamin D deficiency, unspecified: Secondary | ICD-10-CM | POA: Diagnosis not present

## 2019-10-31 DIAGNOSIS — E538 Deficiency of other specified B group vitamins: Secondary | ICD-10-CM | POA: Diagnosis not present

## 2019-10-31 DIAGNOSIS — E1151 Type 2 diabetes mellitus with diabetic peripheral angiopathy without gangrene: Secondary | ICD-10-CM | POA: Diagnosis not present

## 2019-10-31 DIAGNOSIS — E1142 Type 2 diabetes mellitus with diabetic polyneuropathy: Secondary | ICD-10-CM | POA: Diagnosis not present

## 2019-10-31 DIAGNOSIS — S98921A Partial traumatic amputation of right foot, level unspecified, initial encounter: Secondary | ICD-10-CM | POA: Diagnosis not present

## 2019-10-31 DIAGNOSIS — G894 Chronic pain syndrome: Secondary | ICD-10-CM | POA: Diagnosis not present

## 2019-10-31 DIAGNOSIS — T8789 Other complications of amputation stump: Secondary | ICD-10-CM | POA: Diagnosis not present

## 2019-10-31 DIAGNOSIS — Z4781 Encounter for orthopedic aftercare following surgical amputation: Secondary | ICD-10-CM | POA: Diagnosis not present

## 2019-10-31 DIAGNOSIS — I480 Paroxysmal atrial fibrillation: Secondary | ICD-10-CM | POA: Diagnosis not present

## 2019-10-31 DIAGNOSIS — I2510 Atherosclerotic heart disease of native coronary artery without angina pectoris: Secondary | ICD-10-CM | POA: Diagnosis not present

## 2019-11-01 ENCOUNTER — Encounter (INDEPENDENT_AMBULATORY_CARE_PROVIDER_SITE_OTHER): Payer: Self-pay

## 2019-11-02 ENCOUNTER — Encounter (INDEPENDENT_AMBULATORY_CARE_PROVIDER_SITE_OTHER): Payer: Self-pay

## 2019-11-02 DIAGNOSIS — E1151 Type 2 diabetes mellitus with diabetic peripheral angiopathy without gangrene: Secondary | ICD-10-CM | POA: Diagnosis not present

## 2019-11-02 DIAGNOSIS — E1142 Type 2 diabetes mellitus with diabetic polyneuropathy: Secondary | ICD-10-CM | POA: Diagnosis not present

## 2019-11-02 DIAGNOSIS — Z4781 Encounter for orthopedic aftercare following surgical amputation: Secondary | ICD-10-CM | POA: Diagnosis not present

## 2019-11-02 DIAGNOSIS — G894 Chronic pain syndrome: Secondary | ICD-10-CM | POA: Diagnosis not present

## 2019-11-02 DIAGNOSIS — I2510 Atherosclerotic heart disease of native coronary artery without angina pectoris: Secondary | ICD-10-CM | POA: Diagnosis not present

## 2019-11-02 DIAGNOSIS — D509 Iron deficiency anemia, unspecified: Secondary | ICD-10-CM | POA: Diagnosis not present

## 2019-11-02 DIAGNOSIS — I480 Paroxysmal atrial fibrillation: Secondary | ICD-10-CM | POA: Diagnosis not present

## 2019-11-02 DIAGNOSIS — I10 Essential (primary) hypertension: Secondary | ICD-10-CM | POA: Diagnosis not present

## 2019-11-02 DIAGNOSIS — E559 Vitamin D deficiency, unspecified: Secondary | ICD-10-CM | POA: Diagnosis not present

## 2019-11-02 DIAGNOSIS — E538 Deficiency of other specified B group vitamins: Secondary | ICD-10-CM | POA: Diagnosis not present

## 2019-11-03 DIAGNOSIS — I2510 Atherosclerotic heart disease of native coronary artery without angina pectoris: Secondary | ICD-10-CM | POA: Diagnosis not present

## 2019-11-03 DIAGNOSIS — D509 Iron deficiency anemia, unspecified: Secondary | ICD-10-CM | POA: Diagnosis not present

## 2019-11-03 DIAGNOSIS — E559 Vitamin D deficiency, unspecified: Secondary | ICD-10-CM | POA: Diagnosis not present

## 2019-11-03 DIAGNOSIS — E538 Deficiency of other specified B group vitamins: Secondary | ICD-10-CM | POA: Diagnosis not present

## 2019-11-03 DIAGNOSIS — E1151 Type 2 diabetes mellitus with diabetic peripheral angiopathy without gangrene: Secondary | ICD-10-CM | POA: Diagnosis not present

## 2019-11-03 DIAGNOSIS — G894 Chronic pain syndrome: Secondary | ICD-10-CM | POA: Diagnosis not present

## 2019-11-03 DIAGNOSIS — I480 Paroxysmal atrial fibrillation: Secondary | ICD-10-CM | POA: Diagnosis not present

## 2019-11-03 DIAGNOSIS — I10 Essential (primary) hypertension: Secondary | ICD-10-CM | POA: Diagnosis not present

## 2019-11-03 DIAGNOSIS — Z4781 Encounter for orthopedic aftercare following surgical amputation: Secondary | ICD-10-CM | POA: Diagnosis not present

## 2019-11-03 DIAGNOSIS — E1142 Type 2 diabetes mellitus with diabetic polyneuropathy: Secondary | ICD-10-CM | POA: Diagnosis not present

## 2019-11-03 MED ORDER — OXYCODONE HCL 5 MG PO TABS: 5.0000 mg | ORAL_TABLET | Freq: Three times a day (TID) | ORAL | 0 refills | Status: DC | PRN

## 2019-11-06 ENCOUNTER — Telehealth (INDEPENDENT_AMBULATORY_CARE_PROVIDER_SITE_OTHER): Payer: Self-pay

## 2019-11-06 DIAGNOSIS — T8189XA Other complications of procedures, not elsewhere classified, initial encounter: Secondary | ICD-10-CM | POA: Diagnosis not present

## 2019-11-06 NOTE — Telephone Encounter (Signed)
Patient wife was giving medical advice and will speak with the patient to see if he will come in the office. The patient wife also informed that the patient did not fall he was not able to get up from the shower bench so she called the rescue squad to come help him transfer from the shower bench to wheelchair.

## 2019-11-06 NOTE — Telephone Encounter (Signed)
Should we bring the patient to the office to be seen

## 2019-11-07 ENCOUNTER — Encounter (INDEPENDENT_AMBULATORY_CARE_PROVIDER_SITE_OTHER): Payer: Self-pay

## 2019-11-07 ENCOUNTER — Ambulatory Visit (INDEPENDENT_AMBULATORY_CARE_PROVIDER_SITE_OTHER): Payer: Medicare HMO | Admitting: Vascular Surgery

## 2019-11-08 DIAGNOSIS — I480 Paroxysmal atrial fibrillation: Secondary | ICD-10-CM | POA: Diagnosis not present

## 2019-11-08 DIAGNOSIS — D509 Iron deficiency anemia, unspecified: Secondary | ICD-10-CM | POA: Diagnosis not present

## 2019-11-08 DIAGNOSIS — Z4781 Encounter for orthopedic aftercare following surgical amputation: Secondary | ICD-10-CM | POA: Diagnosis not present

## 2019-11-08 DIAGNOSIS — E538 Deficiency of other specified B group vitamins: Secondary | ICD-10-CM | POA: Diagnosis not present

## 2019-11-08 DIAGNOSIS — G894 Chronic pain syndrome: Secondary | ICD-10-CM | POA: Diagnosis not present

## 2019-11-08 DIAGNOSIS — I10 Essential (primary) hypertension: Secondary | ICD-10-CM | POA: Diagnosis not present

## 2019-11-08 DIAGNOSIS — E1151 Type 2 diabetes mellitus with diabetic peripheral angiopathy without gangrene: Secondary | ICD-10-CM | POA: Diagnosis not present

## 2019-11-08 DIAGNOSIS — E559 Vitamin D deficiency, unspecified: Secondary | ICD-10-CM | POA: Diagnosis not present

## 2019-11-08 DIAGNOSIS — E1142 Type 2 diabetes mellitus with diabetic polyneuropathy: Secondary | ICD-10-CM | POA: Diagnosis not present

## 2019-11-08 DIAGNOSIS — I2510 Atherosclerotic heart disease of native coronary artery without angina pectoris: Secondary | ICD-10-CM | POA: Diagnosis not present

## 2019-11-09 ENCOUNTER — Telehealth (INDEPENDENT_AMBULATORY_CARE_PROVIDER_SITE_OTHER): Payer: Self-pay

## 2019-11-09 DIAGNOSIS — I10 Essential (primary) hypertension: Secondary | ICD-10-CM | POA: Diagnosis not present

## 2019-11-09 DIAGNOSIS — E559 Vitamin D deficiency, unspecified: Secondary | ICD-10-CM | POA: Diagnosis not present

## 2019-11-09 DIAGNOSIS — E538 Deficiency of other specified B group vitamins: Secondary | ICD-10-CM | POA: Diagnosis not present

## 2019-11-09 DIAGNOSIS — Z4781 Encounter for orthopedic aftercare following surgical amputation: Secondary | ICD-10-CM | POA: Diagnosis not present

## 2019-11-09 DIAGNOSIS — E1151 Type 2 diabetes mellitus with diabetic peripheral angiopathy without gangrene: Secondary | ICD-10-CM | POA: Diagnosis not present

## 2019-11-09 DIAGNOSIS — G894 Chronic pain syndrome: Secondary | ICD-10-CM | POA: Diagnosis not present

## 2019-11-09 DIAGNOSIS — D509 Iron deficiency anemia, unspecified: Secondary | ICD-10-CM | POA: Diagnosis not present

## 2019-11-09 DIAGNOSIS — I2510 Atherosclerotic heart disease of native coronary artery without angina pectoris: Secondary | ICD-10-CM | POA: Diagnosis not present

## 2019-11-09 DIAGNOSIS — I480 Paroxysmal atrial fibrillation: Secondary | ICD-10-CM | POA: Diagnosis not present

## 2019-11-09 DIAGNOSIS — E1142 Type 2 diabetes mellitus with diabetic polyneuropathy: Secondary | ICD-10-CM | POA: Diagnosis not present

## 2019-11-09 NOTE — Telephone Encounter (Signed)
Home health nurse left a voicemail informing the patient right AKA is very bruise,redness along the incision line,the blisters has burst and left a black mark around incision line, per patient wife the swelling has went some,and the patient is still refusing to go to the ER. Home health nurse wanted to make the provider aware with information

## 2019-11-09 NOTE — Telephone Encounter (Signed)
A detail message has been left on the nurse voicemail from prior message below.

## 2019-11-09 NOTE — Telephone Encounter (Signed)
Unfortunately all we can do is advise the patient that going to the ED for evaluation may be in his best interest especially if he's having worsening weakness.  If not we will see him at his next follow up.  They can reach out to [place] if there are any issues and changes with his wound that need to be addressed prior to that

## 2019-11-10 ENCOUNTER — Inpatient Hospital Stay
Admission: EM | Admit: 2019-11-10 | Discharge: 2019-11-29 | DRG: 463 | Disposition: A | Discharge status: Skilled Nursing Facility | Payer: Medicare HMO | Attending: Internal Medicine | Admitting: Internal Medicine

## 2019-11-10 ENCOUNTER — Other Ambulatory Visit: Payer: Self-pay

## 2019-11-10 ENCOUNTER — Emergency Department: Payer: Medicare HMO

## 2019-11-10 ENCOUNTER — Inpatient Hospital Stay: Payer: Medicare HMO

## 2019-11-10 ENCOUNTER — Encounter: Payer: Self-pay | Admitting: Emergency Medicine

## 2019-11-10 DIAGNOSIS — Z89511 Acquired absence of right leg below knee: Secondary | ICD-10-CM

## 2019-11-10 DIAGNOSIS — E871 Hypo-osmolality and hyponatremia: Secondary | ICD-10-CM | POA: Diagnosis present

## 2019-11-10 DIAGNOSIS — Z888 Allergy status to other drugs, medicaments and biological substances status: Secondary | ICD-10-CM

## 2019-11-10 DIAGNOSIS — I69312 Visuospatial deficit and spatial neglect following cerebral infarction: Secondary | ICD-10-CM

## 2019-11-10 DIAGNOSIS — R4182 Altered mental status, unspecified: Secondary | ICD-10-CM | POA: Diagnosis not present

## 2019-11-10 DIAGNOSIS — T8149XA Infection following a procedure, other surgical site, initial encounter: Secondary | ICD-10-CM | POA: Diagnosis not present

## 2019-11-10 DIAGNOSIS — L02415 Cutaneous abscess of right lower limb: Secondary | ICD-10-CM

## 2019-11-10 DIAGNOSIS — Z23 Encounter for immunization: Secondary | ICD-10-CM

## 2019-11-10 DIAGNOSIS — R0689 Other abnormalities of breathing: Secondary | ICD-10-CM | POA: Diagnosis not present

## 2019-11-10 DIAGNOSIS — I4821 Permanent atrial fibrillation: Secondary | ICD-10-CM

## 2019-11-10 DIAGNOSIS — B181 Chronic viral hepatitis B without delta-agent: Secondary | ICD-10-CM | POA: Diagnosis present

## 2019-11-10 DIAGNOSIS — Z66 Do not resuscitate: Secondary | ICD-10-CM | POA: Diagnosis present

## 2019-11-10 DIAGNOSIS — R0902 Hypoxemia: Secondary | ICD-10-CM | POA: Diagnosis not present

## 2019-11-10 DIAGNOSIS — Z8249 Family history of ischemic heart disease and other diseases of the circulatory system: Secondary | ICD-10-CM

## 2019-11-10 DIAGNOSIS — E876 Hypokalemia: Secondary | ICD-10-CM | POA: Diagnosis not present

## 2019-11-10 DIAGNOSIS — I77810 Thoracic aortic ectasia: Secondary | ICD-10-CM | POA: Diagnosis present

## 2019-11-10 DIAGNOSIS — E1165 Type 2 diabetes mellitus with hyperglycemia: Secondary | ICD-10-CM | POA: Diagnosis not present

## 2019-11-10 DIAGNOSIS — D500 Iron deficiency anemia secondary to blood loss (chronic): Secondary | ICD-10-CM

## 2019-11-10 DIAGNOSIS — K7031 Alcoholic cirrhosis of liver with ascites: Secondary | ICD-10-CM

## 2019-11-10 DIAGNOSIS — K769 Liver disease, unspecified: Secondary | ICD-10-CM

## 2019-11-10 DIAGNOSIS — E785 Hyperlipidemia, unspecified: Secondary | ICD-10-CM | POA: Diagnosis present

## 2019-11-10 DIAGNOSIS — D6959 Other secondary thrombocytopenia: Secondary | ICD-10-CM | POA: Diagnosis present

## 2019-11-10 DIAGNOSIS — Z20822 Contact with and (suspected) exposure to covid-19: Secondary | ICD-10-CM | POA: Diagnosis present

## 2019-11-10 DIAGNOSIS — Z833 Family history of diabetes mellitus: Secondary | ICD-10-CM

## 2019-11-10 DIAGNOSIS — R591 Generalized enlarged lymph nodes: Secondary | ICD-10-CM | POA: Diagnosis present

## 2019-11-10 DIAGNOSIS — Z7984 Long term (current) use of oral hypoglycemic drugs: Secondary | ICD-10-CM

## 2019-11-10 DIAGNOSIS — A419 Sepsis, unspecified organism: Secondary | ICD-10-CM | POA: Diagnosis present

## 2019-11-10 DIAGNOSIS — K219 Gastro-esophageal reflux disease without esophagitis: Secondary | ICD-10-CM | POA: Diagnosis present

## 2019-11-10 DIAGNOSIS — E872 Acidosis: Secondary | ICD-10-CM | POA: Diagnosis present

## 2019-11-10 DIAGNOSIS — A4102 Sepsis due to Methicillin resistant Staphylococcus aureus: Secondary | ICD-10-CM | POA: Diagnosis present

## 2019-11-10 DIAGNOSIS — F1011 Alcohol abuse, in remission: Secondary | ICD-10-CM | POA: Diagnosis present

## 2019-11-10 DIAGNOSIS — G9341 Metabolic encephalopathy: Secondary | ICD-10-CM

## 2019-11-10 DIAGNOSIS — Z7982 Long term (current) use of aspirin: Secondary | ICD-10-CM

## 2019-11-10 DIAGNOSIS — I4891 Unspecified atrial fibrillation: Secondary | ICD-10-CM | POA: Diagnosis not present

## 2019-11-10 DIAGNOSIS — E861 Hypovolemia: Secondary | ICD-10-CM | POA: Diagnosis present

## 2019-11-10 DIAGNOSIS — R7989 Other specified abnormal findings of blood chemistry: Secondary | ICD-10-CM

## 2019-11-10 DIAGNOSIS — E1151 Type 2 diabetes mellitus with diabetic peripheral angiopathy without gangrene: Secondary | ICD-10-CM

## 2019-11-10 DIAGNOSIS — D696 Thrombocytopenia, unspecified: Secondary | ICD-10-CM

## 2019-11-10 DIAGNOSIS — G894 Chronic pain syndrome: Secondary | ICD-10-CM | POA: Diagnosis present

## 2019-11-10 DIAGNOSIS — I69351 Hemiplegia and hemiparesis following cerebral infarction affecting right dominant side: Secondary | ICD-10-CM

## 2019-11-10 DIAGNOSIS — R140 Abdominal distension (gaseous): Secondary | ICD-10-CM | POA: Diagnosis not present

## 2019-11-10 DIAGNOSIS — N5089 Other specified disorders of the male genital organs: Secondary | ICD-10-CM

## 2019-11-10 DIAGNOSIS — R945 Abnormal results of liver function studies: Secondary | ICD-10-CM | POA: Diagnosis not present

## 2019-11-10 DIAGNOSIS — T8743 Infection of amputation stump, right lower extremity: Principal | ICD-10-CM | POA: Diagnosis present

## 2019-11-10 DIAGNOSIS — E1152 Type 2 diabetes mellitus with diabetic peripheral angiopathy with gangrene: Secondary | ICD-10-CM | POA: Diagnosis present

## 2019-11-10 DIAGNOSIS — I69320 Aphasia following cerebral infarction: Secondary | ICD-10-CM

## 2019-11-10 DIAGNOSIS — E119 Type 2 diabetes mellitus without complications: Secondary | ICD-10-CM

## 2019-11-10 DIAGNOSIS — Y835 Amputation of limb(s) as the cause of abnormal reaction of the patient, or of later complication, without mention of misadventure at the time of the procedure: Secondary | ICD-10-CM | POA: Diagnosis not present

## 2019-11-10 DIAGNOSIS — Z87891 Personal history of nicotine dependence: Secondary | ICD-10-CM

## 2019-11-10 DIAGNOSIS — R410 Disorientation, unspecified: Secondary | ICD-10-CM

## 2019-11-10 DIAGNOSIS — B1920 Unspecified viral hepatitis C without hepatic coma: Secondary | ICD-10-CM

## 2019-11-10 DIAGNOSIS — Z7901 Long term (current) use of anticoagulants: Secondary | ICD-10-CM

## 2019-11-10 DIAGNOSIS — R404 Transient alteration of awareness: Secondary | ICD-10-CM | POA: Diagnosis not present

## 2019-11-10 DIAGNOSIS — I70269 Atherosclerosis of native arteries of extremities with gangrene, unspecified extremity: Secondary | ICD-10-CM | POA: Diagnosis present

## 2019-11-10 DIAGNOSIS — Z83438 Family history of other disorder of lipoprotein metabolism and other lipidemia: Secondary | ICD-10-CM

## 2019-11-10 DIAGNOSIS — I10 Essential (primary) hypertension: Secondary | ICD-10-CM | POA: Diagnosis present

## 2019-11-10 DIAGNOSIS — H548 Legal blindness, as defined in USA: Secondary | ICD-10-CM | POA: Diagnosis present

## 2019-11-10 DIAGNOSIS — I2510 Atherosclerotic heart disease of native coronary artery without angina pectoris: Secondary | ICD-10-CM | POA: Diagnosis present

## 2019-11-10 DIAGNOSIS — L03115 Cellulitis of right lower limb: Secondary | ICD-10-CM | POA: Diagnosis present

## 2019-11-10 DIAGNOSIS — I482 Chronic atrial fibrillation, unspecified: Secondary | ICD-10-CM

## 2019-11-10 DIAGNOSIS — E559 Vitamin D deficiency, unspecified: Secondary | ICD-10-CM | POA: Diagnosis present

## 2019-11-10 DIAGNOSIS — R600 Localized edema: Secondary | ICD-10-CM | POA: Diagnosis present

## 2019-11-10 DIAGNOSIS — E878 Other disorders of electrolyte and fluid balance, not elsewhere classified: Secondary | ICD-10-CM | POA: Diagnosis present

## 2019-11-10 DIAGNOSIS — K007 Teething syndrome: Secondary | ICD-10-CM | POA: Diagnosis present

## 2019-11-10 DIAGNOSIS — H53461 Homonymous bilateral field defects, right side: Secondary | ICD-10-CM | POA: Diagnosis present

## 2019-11-10 MED ORDER — SODIUM CHLORIDE 0.9 % IV BOLUS
1000.0000 mL | Freq: Once | INTRAVENOUS | Status: AC
  Administered 2019-11-11: 1000 mL via INTRAVENOUS

## 2019-11-10 MED ORDER — LACTATED RINGERS IV BOLUS (SEPSIS)
1000.0000 mL | Freq: Once | INTRAVENOUS | Status: AC
  Administered 2019-11-11: 1000 mL via INTRAVENOUS

## 2019-11-10 MED ORDER — LACTATED RINGERS IV BOLUS (SEPSIS): 1000.0000 mL | Freq: Once | INTRAVENOUS | Status: DC

## 2019-11-10 MED ORDER — VANCOMYCIN HCL IN DEXTROSE 1-5 GM/200ML-% IV SOLN
1000.0000 mg | Freq: Once | INTRAVENOUS | Status: AC
  Administered 2019-11-11: 1000 mg via INTRAVENOUS
  Filled 2019-11-10: qty 200

## 2019-11-10 MED ORDER — SODIUM CHLORIDE 0.9 % IV SOLN
2.0000 g | Freq: Once | INTRAVENOUS | Status: AC
  Administered 2019-11-11: 2 g via INTRAVENOUS
  Filled 2019-11-10: qty 20

## 2019-11-10 NOTE — ED Triage Notes (Signed)
Pt from home to ED via [facility] for sepsis. Pt had a BKA at [facility] 2 weeks ago by Dr. Lucky Cowboy and has since had a decline in health. Per wife, pt has AMS from his baseline (behavior wise) and has a foul smell from the suture site. Pt has yellow pus and discolored drainage from the site.    Meds given by EMS: 125mg  Sodulmedrol/ 1 bolus/ 159mcg fentanyl   VS by EMS   126/78  BP  103F axillary 180 HR  Pt has taken 10mg  of Eliquis at home

## 2019-11-10 NOTE — ED Provider Notes (Addendum)
[facility] Emergency Department Provider Note  ____________________________________________   First MD Initiated Contact with Patient 11/10/19 2340     (approximate)  I have reviewed the triage vital signs and the nursing notes.   HISTORY  Chief Complaint Code Sepsis  Level 5 caveat:  history/ROS limited by acute/critical illness  HPI [name]. is a 64 y.o. male with very extensive chronic medical history as listed below which notably includes A. fib on Eliquis and recent right above-knee amputation on October 26, 2019 which was a revision of a prior below-knee amputation on the same leg.  This was the result of a wound infection and nonhealing ulcer.  According to the medical record he refused rehab and went directly home on postop day 1 which was about 2 weeks ago.  His wife called EMS tonight for altered mental status, fever, and uncontrolled pain.  She said that his mental status has been altered for the last couple of days.  As of tonight he felt hot.  He has not been eating or drinking very well.  His left leg is red and swollen which she was not previously.  The last couple of times that she changed the dressing she said that it had a foul smell and thick pus.  He had a follow-up appointment with vascular surgery about a week ago (the procedure was performed by Dr. Lucky Cowboy) but the symptoms have developed over the last 3 days.  EMS reports that he was altered and somewhat combative and unable to provide any history.  He received 500 mL of normal saline IV bolus and Solu-Medrol 125 mg IV as per the EMS protocol for sepsis.  He also received fentanyl 100 mcg IV.  He arrived as emergency traffic.  He is unable to provide any history and is simply yelling out in pain but cannot answer questions.   Blood pressure was stable for EMS but his axillary temperature was 103 F and his heart rate has ranged between about 165 and 200.        Past Medical History:    Diagnosis Date  . Acute left PCA stroke ([place]) 02/17/2015  . Anemia    vitamin b and vit d deficiencies  . 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 Atherosclerosis of native arteries of the extremities with gangrene ([place]) 05/08/2018  . Atrial fibrillation ([place])   . Blind    right eye  . Complication of anesthesia 2019   disoriented, trying to jump out of bed after bka  . Coronary artery calcification seen on CT scan    a. 08/2018 CT Chest: Mild cor Ca2+.  . Diabetes mellitus with complication ([place])   . Dilated aortic root ([place])    a. 04/2018 Echo: 4.1cm. Asc Ao 3.5cm; b. 04/2018 CT: Asc Ao 3.6cm. 4.1cm @ sinus of Valsalva.  . Dysrhythmia    a fib  . Gangrene of right foot ([place]) 06/02/2018  . GERD (gastroesophageal reflux disease)   . History of echocardiogram    a. 04/2018 Echo: Ef 60-65%, no rwma, midly to mod dil Ao root - 4.1cm. Asc Ao 3.5cm. Mild MR. Nl RV fxn. Nl PASP.  Marland Kitchen History of hernia repair   . History of stress test    a. 2016 MV ([name]): EF 58%, no ischemia.  . Hyperlipidemia   . Hypertension   . Leg pain   . Legally blind    right eye homonymous hemianopsa  .  Memory change    d/t strokes  . Necrotic toes ([place]) 02/21/2018  . PAF (paroxysmal atrial fibrillation) ([name])    a. on Eliquis as of 2018; b. CHADS2VASc => 5 (HTN, DM, stroke x 2, vascular disease)  . Peripheral vascular disease ([place])    a. followed by Dr. Lucky Cowboy; b. s/p kissing balloon stents and right external iliac stent in 10/2017; c. 05/2018 s/p R BKA; c. 08/2019: Bilat Iliac PTA & DBA or REIA.  . Pulmonary nodules   . Stroke [facility])    a. 2016 & 2018  . Stroke [facility]) 07/11/2018    Patient Active Problem List   Diagnosis Date Noted  . Sepsis ([place]) 11/11/2019  . Pressure ulcer of BKA stump, stage 3 ([place]) 10/26/2019  . Gangrene ([place]) 09/06/2019  . Pressure ulcer of right calf, unstageable ([place]) 07/17/2019  . Fungal dermatitis 04/13/2019  . Leg edema, right 03/23/2019  . Acute  right hemiparesis ([place]) 09/26/2018  . Lung nodule 08/15/2018  . Phantom pain ([place]) 08/14/2018  . Neurogenic pain 08/10/2018  . Stump pain ([place]) (Right) 07/20/2018  . Amputation stump infection ([place]) 07/20/2018  . Chronic anticoagulation (ELIQUIS) 07/20/2018  . Vitamin D insufficiency 07/18/2018  . Elevated sed rate 07/18/2018  . Chronic pain of right lower extremity 07/11/2018  . Chronic pain syndrome 07/11/2018  . Pharmacologic therapy 07/11/2018  . Hx of BKA, right ([place]) 06/16/2018  . S/P bilateral BKA (below knee amputation) ([place]) 06/11/2018  . Iron deficiency anemia 05/11/2018  . A-fib ([place]) 08/20/2017  . PVD (peripheral vascular disease) ([place]) 07/15/2017  . 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 Memory loss 09/27/2015  . 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 Diabetic peripheral neuropathy ([place]) 05/16/2015  . History of stroke with residual deficit 04/01/2015  . Chronic headaches 03/15/2015  . Right homonymous hemianopsia 02/17/2015  . History of tobacco abuse 01/24/2015  . Hyperlipidemia 01/21/2015  . 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 ABDOMINAL AORTA STENT     x 2  . AMPUTATION Right 06/02/2018   Procedure: AMPUTATION BELOW KNEE;  Surgeon: [name], MD;  Location: [facility] ORS;  Service: Vascular;  Laterality: Right;  . AMPUTATION Right 10/26/2019   Procedure: RIGHT AMPUTATION ABOVE KNEE;  Surgeon: [name], MD;  Location: [facility] ORS;  Service: Vascular;  Laterality: Right;  . APPENDECTOMY    . COLONOSCOPY WITH PROPOFOL N/A 05/12/2018   Procedure: COLONOSCOPY WITH PROPOFOL;  Surgeon: [name], MD;  Location: [facility] ENDOSCOPY;  Service: Gastroenterology;  Laterality: N/A;  . ESOPHAGOGASTRODUODENOSCOPY (EGD) WITH PROPOFOL N/A 05/12/2018   Procedure: ESOPHAGOGASTRODUODENOSCOPY (EGD) WITH PROPOFOL;  Surgeon: [name], MD;  Location[facility]  ENDOSCOPY;  Service: Gastroenterology;  Laterality: N/A;  . GIVENS CAPSULE STUDY N/A 07/13/2018   Procedure: GIVENS CAPSULE STUDY;  Surgeon: [name], MD;  Location: [facility] ENDOSCOPY;  Service: Gastroenterology;  Laterality: N/A;  . HERNIA REPAIR     UMBILICAL  . LEG AMPUTATION Right 10/26/2019   prior right BKA required AKA for non-healing ulcer and wound infection  . LOWER EXTREMITY ANGIOGRAPHY Right 10/25/2017   Procedure: LOWER EXTREMITY ANGIOGRAPHY;  Surgeon: [name], MD;  Location: [place] CV LAB;  Service: Cardiovascular;  Laterality: Right;  . LOWER EXTREMITY ANGIOGRAPHY Right 01/13/2018   Procedure: LOWER EXTREMITY ANGIOGRAPHY;  Surgeon: [name], MD;  Location: [place] CV LAB;  Service: Cardiovascular;  Laterality: Right;  . LOWER EXTREMITY  ANGIOGRAPHY Right 08/28/2019   Procedure: LOWER EXTREMITY ANGIOGRAPHY;  Surgeon: [name], MD;  Location: [place] CV LAB;  Service: Cardiovascular;  Laterality: Right;  . LOWER EXTREMITY INTERVENTION  10/25/2017   Procedure: LOWER EXTREMITY INTERVENTION;  Surgeon: [name], MD;  Location: [place] CV LAB;  Service: Cardiovascular;;  . SKIN SPLIT GRAFT Right 12/28/2018   Procedure: SKIN GRAFT SPLIT THICKNESS ( SYNTHETIC );  Surgeon: [name], MD;  Location: [facility] ORS;  Service: Vascular;  Laterality: Right;  . TONSILLECTOMY    . WOUND DEBRIDEMENT Right 08/08/2018   Procedure: DEBRIDEMENT WOUND WITH WOUND VAC APPLICATION;  Surgeon: [name], MD;  Location: [facility] ORS;  Service: Vascular;  Laterality: Right;    Prior to Admission medications   Medication Sig Start Date End Date Taking? Authorizing Provider  acetaminophen (TYLENOL) 500 MG tablet Take 1,000 mg by mouth every 6 (six) hours as needed (for pain.).   Yes [provider]  apixaban (ELIQUIS) 5 MG TABS tablet Take 1 tablet (5 mg total) by mouth 2 (two) times daily. 10/19/19  Yes Johnson, Megan P, DO  aspirin 81 MG EC tablet Take 1 tablet (81  mg total) by mouth daily. Swallow whole. 10/27/19  Yes Stegmayer, Janalyn Harder, PA-C  cholecalciferol (VITAMIN D3) 25 MCG (1000 UT) tablet Take 5,000 Units by mouth daily.    Yes [provider]  docusate sodium (COLACE) 100 MG capsule Take 1 capsule (100 mg total) by mouth daily. Do not take if you have loose stools 12/13/18  Yes Earlie Server, MD  empagliflozin (JARDIANCE) 10 MG TABS tablet Take 5 mg by mouth daily. 10/19/19  Yes Johnson, Megan P, DO  ezetimibe-simvastatin (VYTORIN) 10-40 MG tablet TAKE 1 TABLET BY MOUTH DAILY AT 6 PM. 09/15/19  Yes Gollan, Kathlene November, MD  ferrous sulfate (FERROUSUL) 325 (65 FE) MG tablet Take 1 tablet (325 mg total) by mouth 2 (two) times daily with a meal. 03/14/19  Yes Earlie Server, MD  gabapentin (NEURONTIN) 800 MG tablet Take 1.5 tablets (1,200 mg total) by mouth 3 (three) times daily. 10/19/19  Yes Johnson, Megan P, DO  hydrochlorothiazide (MICROZIDE) 12.5 MG capsule TAKE 1 CAPSULE BY MOUTH EVERY DAY 09/29/19  Yes Johnson, Megan P, DO  lansoprazole (PREVACID) 30 MG capsule Take 30 mg by mouth daily at 12 noon.   Yes [provider]  metFORMIN (GLUCOPHAGE) 1000 MG tablet Take 1 tablet (1,000 mg total) by mouth 2 (two) times daily with a meal. 10/19/19  Yes Johnson, Megan P, DO  metoprolol tartrate (LOPRESSOR) 25 MG tablet Take 1 tablet (25 mg total) by mouth 2 (two) times daily. 10/19/19  Yes Johnson, Megan P, DO  Multiple Vitamin (MULTIVITAMIN WITH MINERALS) TABS tablet Take 1 tablet by mouth daily. 06/24/17  Yes Fritzi Mandes, MD  ONE [facility] ULTRA TEST test strip USE TO TEST BLOOD SUGAR BID 09/26/18  Yes Johnson, Megan P, DO  ONETOUCH DELICA LANCETS [id number] MISC USE AS DIRECTED TO TEST BLOOD SUGAR BID 09/26/18  Yes Johnson, Megan P, DO  oxyCODONE (OXY IR/ROXICODONE) 5 MG immediate release tablet Take 1 tablet (5 mg total) by mouth every 8 (eight) hours as needed for severe pain. 11/03/19  Yes Serafina Mitchell, MD  vitamin C (ASCORBIC ACID) 500 MG tablet Take 500 mg by  mouth daily.   Yes [provider]    Allergies Sodium pentobarbital [pentobarbital] and Lipitor [atorvastatin]  Family History  Problem Relation Age of Onset  . Diabetes Father   .  Hypertension Father   . Hyperlipidemia Father     Social History Social History   Tobacco Use  . Smoking status: Former Smoker    Packs/day: 1.00    Years: 30.00    Pack years: 30.00    Types: Cigarettes    Quit date: 01/09/2018    Years since quitting: 1.8  . Smokeless tobacco: Never Used  Substance Use Topics  . Alcohol use: Yes    Alcohol/week: 0.0 standard drinks    Comment: occasionally beer   . Drug use: No    Review of Systems Level 5 caveat:  history/ROS limited by acute/critical illness  ____________________________________________   PHYSICAL EXAM:  VITAL SIGNS: ED Triage Vitals  Enc Vitals Group     BP 11/10/19 2349 (!) 146/86     Pulse Rate 11/10/19 2349 (!) 169     Resp 11/10/19 2349 17     Temp --      Temp src --      SpO2 11/10/19 2349 93 %     Weight 11/10/19 2350 88.5 kg (195 lb 1.7 oz)     Height 11/10/19 2350 1.753 m (5\' 9" )     Head Circumference --      Peak Flow --      Pain Score 11/10/19 2350 10     Pain Loc --      Pain Edu? --      Excl. in [place]? --     Constitutional: Awake and in distress, unable to answer questions. Eyes: Conjunctivae are normal.  Head: Atraumatic. Nose: No congestion/rhinnorhea. Mouth/Throat: Patient is wearing a mask. Neck: No stridor.  No meningeal signs.   Cardiovascular: Rapid rate and irregular rhythm consistent with A. fib with RVR. Grossly normal heart sounds. Respiratory: Increased respiratory rate but no apparent difficulty breathing. Gastrointestinal: Soft and nontender. No distention.  Musculoskeletal: Left lower extremity is intact but notable for tense edema below the knee down to the foot with erythema, overall consistent with probable cellulitis.  His right lower extremity is notable for above-knee  amputation and when the dressing was removed he had thick purulent discharge from the wound with necrotic tissue at the site of the recent surgery. Neurologic: Patient is moving all 4 extremities but unable to participate in neurological exam. Skin:  Skin is very warm consistent with fever and intact except for the right lower extremity wound.   ____________________________________________   LABS (all labs ordered are listed, but only abnormal results are displayed)  Labs Reviewed  COMPREHENSIVE METABOLIC PANEL - Abnormal; Notable for the following components:      Result Value   Sodium 130 (*)    Chloride 95 (*)    Glucose, Bld 145 (*)    Calcium 8.2 (*)    Albumin 2.5 (*)    AST 359 (*)    ALT 280 (*)    Alkaline Phosphatase 190 (*)    Total Bilirubin 2.2 (*)    All other components within normal limits  BRAIN NATRIURETIC PEPTIDE - Abnormal; Notable for the following components:   B Natriuretic Peptide 151.0 (*)    All other components within normal limits  CBC WITH DIFFERENTIAL/PLATELET - Abnormal; Notable for the following components:   WBC 12.8 (*)    RBC 3.73 (*)    Hemoglobin 9.5 (*)    HCT 30.0 (*)    MCH 25.5 (*)    RDW 16.9 (*)    Platelets 91 (*)    nRBC 1.6 (*)  Neutro Abs 10.1 (*)    Abs Immature Granulocytes 0.59 (*)    All other components within normal limits  PROTIME-INR - Abnormal; Notable for the following components:   Prothrombin Time 21.4 (*)    INR 1.9 (*)    All other components within normal limits  URINALYSIS, COMPLETE (UACMP) WITH MICROSCOPIC - Abnormal; Notable for the following components:   Color, Urine AMBER (*)    APPearance HAZY (*)    Glucose, UA >=500 (*)    Hgb urine dipstick SMALL (*)    Ketones, ur 20 (*)    Protein, ur 30 (*)    Bacteria, UA RARE (*)    All other components within normal limits  BASIC METABOLIC PANEL - Abnormal; Notable for the following components:   Sodium 129 (*)    CO2 20 (*)    Glucose, Bld 204 (*)     Calcium 7.7 (*)    All other components within normal limits  CBC - Abnormal; Notable for the following components:   WBC 14.1 (*)    RBC 3.41 (*)    Hemoglobin 8.9 (*)    HCT 27.5 (*)    RDW 17.0 (*)    Platelets 76 (*)    nRBC 0.4 (*)    All other components within normal limits  RESPIRATORY PANEL BY RT PCR (FLU A&B, COVID)  CULTURE, BLOOD (ROUTINE X 2)  CULTURE, BLOOD (ROUTINE X 2)  URINE CULTURE  AEROBIC/ANAEROBIC CULTURE (SURGICAL/DEEP WOUND)  LACTIC ACID, PLASMA  LIPASE, BLOOD  PROCALCITONIN  LACTIC ACID, PLASMA  C-REACTIVE PROTEIN  LACTIC ACID, PLASMA  LACTIC ACID, PLASMA  HEPATITIS PANEL, ACUTE  POC SARS CORONAVIRUS 2 AG -  ED   ____________________________________________  EKG  ED ECG REPORT #1 I, Hinda Kehr, the attending physician, personally viewed and interpreted this ECG.  Date: 11/10/2019 EKG Time: 23: 39 Rate: 175 Rhythm: A. fib with RVR QRS Axis: Left axis deviation Intervals: normal other than a-fib ST/T Wave abnormalities: Non-specific ST segment / T-wave changes, but no clear evidence of acute ischemia. Narrative Interpretation: Very rapid rate with some rate related changes but not consistent with STEMI  ED ECG REPORT #2 I, Hinda Kehr, the attending physician, personally viewed and interpreted this ECG.  Date: 11/11/2019 EKG Time: 1:16 Rate: 172 Rhythm: a-fib w/ RVR QRS Axis: LAD Intervals: normal other than a-fib ST/T Wave abnormalities: Non-specific ST segment / T-wave changes, but no clear evidence of acute ischemia. Narrative Interpretation: Very rapid rate with some rate related changes but not consistent with STEMI.  No significant change compared to prior.    ____________________________________________  RADIOLOGY Ursula Alert, personally viewed and evaluated these images (plain radiographs) as part of my medical decision making, as well as reviewing the written report by the radiologist.  ED MD interpretation:  No  abnormality on CXR.  No osteomyelitis.  Some gas in the tissues at the distal end of the leg near the AKA surgical site.  Official radiology report(s): DG Chest Port 1 View  Result Date: 11/11/2019 CLINICAL DATA:  Sepsis EXAM: PORTABLE CHEST 1 VIEW COMPARISON:  06/21/2017 FINDINGS: No focal airspace disease or effusion. Borderline cardiomegaly. No pneumothorax. IMPRESSION: No active disease. Electronically Signed   By: Donavan Foil M.D.   On: 11/11/2019 00:40   DG Femur Portable 1 View Right  Result Date: 11/11/2019 CLINICAL DATA:  Sepsis, pain EXAM: RIGHT FEMUR PORTABLE 1 VIEW COMPARISON:  MRI 07/25/2018 FINDINGS: Status post above the knee amputation. Gas locules within the  stump soft tissues. Cut edge of the femur is smooth and without periostitis or gross bone destruction. No fracture identified IMPRESSION: 1. Status post above the knee amputation without definitive acute osseous abnormality 2. Small gas locules within the soft tissues distal to the residual femur, probably postoperative given recent history of AKA Electronically Signed   By: Donavan Foil M.D.   On: 11/11/2019 00:43    ____________________________________________   PROCEDURES   Procedure(s) performed (including Critical Care):  .Critical Care Performed by: Hinda Kehr, MD Authorized by: Hinda Kehr, MD   Critical care provider statement:    Critical care time (minutes):  45   Critical care time was exclusive of:  Separately billable procedures and treating other patients   Critical care was necessary to treat or prevent imminent or life-threatening deterioration of the following conditions:  Sepsis (a-fib w/ RVR)   Critical care was time spent personally by me on the following activities:  Development of treatment plan with patient or surrogate, discussions with consultants, evaluation of patient's response to treatment, examination of patient, obtaining history from patient or surrogate, ordering and performing  treatments and interventions, ordering and review of laboratory studies, ordering and review of radiographic studies, pulse oximetry, re-evaluation of patient's condition and review of old charts     ____________________________________________   INITIAL IMPRESSION / MDM / [place] / ED COURSE  As part of my medical decision making, I reviewed the following data within the electronic MEDICAL RECORD NUMBER History obtained from family, Nursing notes reviewed and incorporated, Labs reviewed , EKG interpreted , Old chart reviewed, Radiograph reviewed , Discussed with admitting physician (Dr. Sidney Ace) and Notes from prior ED visits   Differential diagnosis includes, but is not limited to, sepsis, cellulitis, wound infection, bacteremia, less likely pneumonia or COVID-19 or urinary tract infection.  The patient presents with toxic appearance and vital signs.  He appears to have a postoperative wound infection of the remainder of his right lower extremity after the above-knee amputation 2 weeks ago.  I collected a wound culture sample and we are proceeding with code sepsis protocol including ceftriaxone 2 g IV and vancomycin per pharmacy consult.  Cultures are being obtained and standard lab work is pending.  I have ordered 30 mL/kg crystalloid IV fluid bolus and after the patient's volume has been repleted I will reassess the need for rate control of his A. fib with RVR.  I am giving morphine 4 mg IV and the patient will likely need additional pain control as well.  He is moaning and yelling in pain but cannot tell me what specifically is hurting, however he has no tenderness to palpation of the abdomen.      Clinical Course as of Nov 10 513  Sat Nov 11, 2019  0001 Discussed case in person with Dr. Sidney Ace who will admit.  Ordering Tylenol 650 PR for fever   [CF]  0037 Lactic Acid, Venous: 1.7 [CF]  0045 No acute disease  DG Chest Port 1 View [CF]  [phone] Some gas at site of amputation, likely  post-operative, no obvious osteomyelitis  DG Femur Portable 1 View Right [CF]  0101 leukocytosis, stable anemia  CBC WITH DIFFERENTIAL(!) [CF]  0101 glucosuria, ketones, no obvious infection  Urinalysis, Complete w Microscopic(!) [CF]  0101 COVID antigen test is negative.  Proceeding with PCR test.  Consulting hospitalist for admission.   [CF]  0121 Procalcitonin: 1.84 [CF]  0514  (Note that documentation was delayed due to multiple ED  patients requiring immediate care.)  I discussed the case in person with Dr. Sidney Ace who will admit the patient.  I also reassessed the patient at bedside with Dr. Sidney Ace.  The patient is more alert and oriented than he was previously and his heart rate is improved to about 120.  He is still being fluid resuscitated.  Temperature has improved but the fever is not completely resolved.  Wife is at bedside and I updated her about the plan for admission and turned over care to Dr. Sidney Ace.   [CF]    Clinical Course User Index [CF] Hinda Kehr, MD     ____________________________________________  FINAL CLINICAL IMPRESSION(S) / ED DIAGNOSES  Final diagnoses:  Sepsis, due to unspecified organism, unspecified whether acute organ dysfunction present [facility])  Delirium  Wound infection after surgery  Current use of long term anticoagulation  Atrial fibrillation with RVR ([place])  Elevated LFTs     MEDICATIONS GIVEN DURING THIS VISIT:  Medications  lactated ringers bolus 1,000 mL (1,000 mLs Intravenous New Bag/Given 11/11/19 0142)    And  lactated ringers bolus 1,000 mL (1,000 mLs Intravenous New Bag/Given 11/11/19 0142)  vancomycin (VANCOCIN) IVPB 1000 mg/200 mL premix (1,000 mg Intravenous New Bag/Given 11/11/19 0149)  HYDROmorphone (DILAUDID) injection 1 mg (1 mg Intravenous Given 11/11/19 0028)  acetaminophen (TYLENOL) suppository 650 mg (650 mg Rectal Given 11/11/19 0150)  vancomycin (VANCOCIN) IVPB 1000 mg/200 mL premix (0 mg Intravenous Stopped 11/11/19 0127)   cefTRIAXone (ROCEPHIN) 2 g in sodium chloride 0.9 % 100 mL IVPB (0 g Intravenous Stopped 11/11/19 0131)  sodium chloride 0.9 % bolus 1,000 mL (0 mLs Intravenous Stopped 11/11/19 0132)  morphine 4 MG/ML injection 4 mg (4 mg Intravenous Given 11/11/19 0009)     ED Discharge Orders    None      *Please note:  Hargis B Eckenrode Jr. was evaluated in Emergency Department on 11/11/2019 for the symptoms described in the history of present illness. He was evaluated in the context of the global COVID-19 pandemic, which necessitated consideration that the patient might be at risk for infection with the SARS-CoV-2 virus that causes COVID-19. Institutional protocols and algorithms that pertain to the evaluation of patients at risk for COVID-19 are in a state of rapid change based on information released by regulatory bodies including the CDC and federal and state organizations. These policies and algorithms were followed during the patient's care in the ED.  Some ED evaluations and interventions may be delayed as a result of limited staffing during the pandemic.*  Note:  This document was prepared using Dragon voice recognition software and may include unintentional dictation errors.   Hinda Kehr, MD 11/11/19 [id number]    Hinda Kehr, MD 11/11/19 [phone]

## 2019-11-11 ENCOUNTER — Inpatient Hospital Stay: Payer: Medicare HMO

## 2019-11-11 ENCOUNTER — Encounter
Admission: EM | Disposition: A | Discharge status: Skilled Nursing Facility | Payer: Self-pay | Source: Home / Self Care | Attending: Internal Medicine

## 2019-11-11 ENCOUNTER — Encounter: Payer: Self-pay | Admitting: Family Medicine

## 2019-11-11 ENCOUNTER — Inpatient Hospital Stay: Payer: Medicare HMO | Admitting: Anesthesiology

## 2019-11-11 ENCOUNTER — Emergency Department: Payer: Medicare HMO

## 2019-11-11 DIAGNOSIS — A419 Sepsis, unspecified organism: Secondary | ICD-10-CM | POA: Diagnosis not present

## 2019-11-11 DIAGNOSIS — R7989 Other specified abnormal findings of blood chemistry: Secondary | ICD-10-CM

## 2019-11-11 DIAGNOSIS — R4182 Altered mental status, unspecified: Secondary | ICD-10-CM | POA: Diagnosis not present

## 2019-11-11 DIAGNOSIS — L02419 Cutaneous abscess of limb, unspecified: Secondary | ICD-10-CM | POA: Diagnosis not present

## 2019-11-11 DIAGNOSIS — T8149XA Infection following a procedure, other surgical site, initial encounter: Secondary | ICD-10-CM | POA: Diagnosis not present

## 2019-11-11 DIAGNOSIS — E1152 Type 2 diabetes mellitus with diabetic peripheral angiopathy with gangrene: Secondary | ICD-10-CM | POA: Diagnosis present

## 2019-11-11 DIAGNOSIS — M79651 Pain in right thigh: Secondary | ICD-10-CM | POA: Diagnosis not present

## 2019-11-11 DIAGNOSIS — B954 Other streptococcus as the cause of diseases classified elsewhere: Secondary | ICD-10-CM | POA: Diagnosis not present

## 2019-11-11 DIAGNOSIS — E1110 Type 2 diabetes mellitus with ketoacidosis without coma: Secondary | ICD-10-CM | POA: Diagnosis not present

## 2019-11-11 DIAGNOSIS — G9340 Encephalopathy, unspecified: Secondary | ICD-10-CM | POA: Diagnosis not present

## 2019-11-11 DIAGNOSIS — Y835 Amputation of limb(s) as the cause of abnormal reaction of the patient, or of later complication, without mention of misadventure at the time of the procedure: Secondary | ICD-10-CM | POA: Diagnosis not present

## 2019-11-11 DIAGNOSIS — E871 Hypo-osmolality and hyponatremia: Secondary | ICD-10-CM | POA: Diagnosis present

## 2019-11-11 DIAGNOSIS — Z4781 Encounter for orthopedic aftercare following surgical amputation: Secondary | ICD-10-CM | POA: Diagnosis not present

## 2019-11-11 DIAGNOSIS — N5089 Other specified disorders of the male genital organs: Secondary | ICD-10-CM | POA: Diagnosis not present

## 2019-11-11 DIAGNOSIS — B952 Enterococcus as the cause of diseases classified elsewhere: Secondary | ICD-10-CM | POA: Diagnosis not present

## 2019-11-11 DIAGNOSIS — I998 Other disorder of circulatory system: Secondary | ICD-10-CM | POA: Diagnosis not present

## 2019-11-11 DIAGNOSIS — T8740 Infection of amputation stump, unspecified extremity: Secondary | ICD-10-CM | POA: Diagnosis not present

## 2019-11-11 DIAGNOSIS — E1151 Type 2 diabetes mellitus with diabetic peripheral angiopathy without gangrene: Secondary | ICD-10-CM | POA: Diagnosis not present

## 2019-11-11 DIAGNOSIS — D696 Thrombocytopenia, unspecified: Secondary | ICD-10-CM | POA: Diagnosis not present

## 2019-11-11 DIAGNOSIS — G9341 Metabolic encephalopathy: Secondary | ICD-10-CM | POA: Diagnosis not present

## 2019-11-11 DIAGNOSIS — Z7401 Bed confinement status: Secondary | ICD-10-CM | POA: Diagnosis not present

## 2019-11-11 DIAGNOSIS — I69351 Hemiplegia and hemiparesis following cerebral infarction affecting right dominant side: Secondary | ICD-10-CM | POA: Diagnosis not present

## 2019-11-11 DIAGNOSIS — M2550 Pain in unspecified joint: Secondary | ICD-10-CM | POA: Diagnosis not present

## 2019-11-11 DIAGNOSIS — I4821 Permanent atrial fibrillation: Secondary | ICD-10-CM | POA: Diagnosis not present

## 2019-11-11 DIAGNOSIS — K219 Gastro-esophageal reflux disease without esophagitis: Secondary | ICD-10-CM | POA: Diagnosis not present

## 2019-11-11 DIAGNOSIS — K7031 Alcoholic cirrhosis of liver with ascites: Secondary | ICD-10-CM | POA: Diagnosis not present

## 2019-11-11 DIAGNOSIS — R768 Other specified abnormal immunological findings in serum: Secondary | ICD-10-CM | POA: Diagnosis not present

## 2019-11-11 DIAGNOSIS — B181 Chronic viral hepatitis B without delta-agent: Secondary | ICD-10-CM | POA: Diagnosis present

## 2019-11-11 DIAGNOSIS — R7401 Elevation of levels of liver transaminase levels: Secondary | ICD-10-CM | POA: Diagnosis not present

## 2019-11-11 DIAGNOSIS — D500 Iron deficiency anemia secondary to blood loss (chronic): Secondary | ICD-10-CM | POA: Diagnosis not present

## 2019-11-11 DIAGNOSIS — I69398 Other sequelae of cerebral infarction: Secondary | ICD-10-CM | POA: Diagnosis not present

## 2019-11-11 DIAGNOSIS — E872 Acidosis: Secondary | ICD-10-CM | POA: Diagnosis present

## 2019-11-11 DIAGNOSIS — L02415 Cutaneous abscess of right lower limb: Secondary | ICD-10-CM | POA: Diagnosis not present

## 2019-11-11 DIAGNOSIS — R6520 Severe sepsis without septic shock: Secondary | ICD-10-CM | POA: Diagnosis not present

## 2019-11-11 DIAGNOSIS — Z66 Do not resuscitate: Secondary | ICD-10-CM | POA: Diagnosis not present

## 2019-11-11 DIAGNOSIS — E559 Vitamin D deficiency, unspecified: Secondary | ICD-10-CM | POA: Diagnosis present

## 2019-11-11 DIAGNOSIS — A4102 Sepsis due to Methicillin resistant Staphylococcus aureus: Secondary | ICD-10-CM | POA: Diagnosis not present

## 2019-11-11 DIAGNOSIS — K7460 Unspecified cirrhosis of liver: Secondary | ICD-10-CM | POA: Diagnosis not present

## 2019-11-11 DIAGNOSIS — I2510 Atherosclerotic heart disease of native coronary artery without angina pectoris: Secondary | ICD-10-CM | POA: Diagnosis present

## 2019-11-11 DIAGNOSIS — D649 Anemia, unspecified: Secondary | ICD-10-CM | POA: Diagnosis not present

## 2019-11-11 DIAGNOSIS — T879 Unspecified complications of amputation stump: Secondary | ICD-10-CM | POA: Diagnosis not present

## 2019-11-11 DIAGNOSIS — B957 Other staphylococcus as the cause of diseases classified elsewhere: Secondary | ICD-10-CM | POA: Diagnosis not present

## 2019-11-11 DIAGNOSIS — R404 Transient alteration of awareness: Secondary | ICD-10-CM | POA: Diagnosis not present

## 2019-11-11 DIAGNOSIS — D6959 Other secondary thrombocytopenia: Secondary | ICD-10-CM | POA: Diagnosis present

## 2019-11-11 DIAGNOSIS — I96 Gangrene, not elsewhere classified: Secondary | ICD-10-CM | POA: Diagnosis not present

## 2019-11-11 DIAGNOSIS — R69 Illness, unspecified: Secondary | ICD-10-CM | POA: Diagnosis not present

## 2019-11-11 DIAGNOSIS — Z7901 Long term (current) use of anticoagulants: Secondary | ICD-10-CM | POA: Diagnosis not present

## 2019-11-11 DIAGNOSIS — R41841 Cognitive communication deficit: Secondary | ICD-10-CM | POA: Diagnosis not present

## 2019-11-11 DIAGNOSIS — E1140 Type 2 diabetes mellitus with diabetic neuropathy, unspecified: Secondary | ICD-10-CM | POA: Diagnosis not present

## 2019-11-11 DIAGNOSIS — K769 Liver disease, unspecified: Secondary | ICD-10-CM | POA: Diagnosis not present

## 2019-11-11 DIAGNOSIS — D72829 Elevated white blood cell count, unspecified: Secondary | ICD-10-CM | POA: Diagnosis not present

## 2019-11-11 DIAGNOSIS — K7689 Other specified diseases of liver: Secondary | ICD-10-CM | POA: Diagnosis not present

## 2019-11-11 DIAGNOSIS — K8020 Calculus of gallbladder without cholecystitis without obstruction: Secondary | ICD-10-CM | POA: Diagnosis not present

## 2019-11-11 DIAGNOSIS — M6281 Muscle weakness (generalized): Secondary | ICD-10-CM | POA: Diagnosis not present

## 2019-11-11 DIAGNOSIS — T8743 Infection of amputation stump, right lower extremity: Secondary | ICD-10-CM | POA: Diagnosis not present

## 2019-11-11 DIAGNOSIS — E1169 Type 2 diabetes mellitus with other specified complication: Secondary | ICD-10-CM | POA: Diagnosis not present

## 2019-11-11 DIAGNOSIS — Z89521 Acquired absence of right knee: Secondary | ICD-10-CM | POA: Diagnosis not present

## 2019-11-11 DIAGNOSIS — L03115 Cellulitis of right lower limb: Secondary | ICD-10-CM | POA: Diagnosis not present

## 2019-11-11 DIAGNOSIS — I70269 Atherosclerosis of native arteries of extremities with gangrene, unspecified extremity: Secondary | ICD-10-CM | POA: Diagnosis not present

## 2019-11-11 DIAGNOSIS — R410 Disorientation, unspecified: Secondary | ICD-10-CM | POA: Diagnosis not present

## 2019-11-11 DIAGNOSIS — I10 Essential (primary) hypertension: Secondary | ICD-10-CM | POA: Diagnosis not present

## 2019-11-11 DIAGNOSIS — Z23 Encounter for immunization: Secondary | ICD-10-CM | POA: Diagnosis not present

## 2019-11-11 DIAGNOSIS — I739 Peripheral vascular disease, unspecified: Secondary | ICD-10-CM | POA: Diagnosis not present

## 2019-11-11 DIAGNOSIS — I4891 Unspecified atrial fibrillation: Secondary | ICD-10-CM | POA: Diagnosis not present

## 2019-11-11 DIAGNOSIS — Z20822 Contact with and (suspected) exposure to covid-19: Secondary | ICD-10-CM | POA: Diagnosis not present

## 2019-11-11 DIAGNOSIS — E785 Hyperlipidemia, unspecified: Secondary | ICD-10-CM | POA: Diagnosis not present

## 2019-11-11 DIAGNOSIS — I482 Chronic atrial fibrillation, unspecified: Secondary | ICD-10-CM | POA: Diagnosis not present

## 2019-11-11 DIAGNOSIS — H5461 Unqualified visual loss, right eye, normal vision left eye: Secondary | ICD-10-CM | POA: Diagnosis not present

## 2019-11-11 DIAGNOSIS — B9562 Methicillin resistant Staphylococcus aureus infection as the cause of diseases classified elsewhere: Secondary | ICD-10-CM | POA: Diagnosis not present

## 2019-11-11 HISTORY — PX: INCISION AND DRAINAGE: SHX5863

## 2019-11-11 LAB — URINALYSIS, COMPLETE (UACMP) WITH MICROSCOPIC
Bilirubin Urine: NEGATIVE
Glucose, UA: >=500 mg/dL — AB
Ketones, ur: 20 mg/dL — AB
Leukocytes,Ua: NEGATIVE
Nitrite: NEGATIVE
Protein, ur: 30 mg/dL — AB
Specific Gravity, Urine: 1.022 (ref 1.005–1.030)
Squamous Epithelial / HPF: NONE SEEN (ref 0–5)
pH: 5 (ref 5.0–8.0)

## 2019-11-11 LAB — CBC WITH DIFFERENTIAL/PLATELET
Abs Immature Granulocytes: 0.59 10*3/uL — ABNORMAL HIGH (ref 0.00–0.07)
Basophils Absolute: 0.1 10*3/uL (ref 0.0–0.1)
Basophils Relative: 0 %
Eosinophils Absolute: 0.2 10*3/uL (ref 0.0–0.5)
Eosinophils Relative: 1 %
HCT: 30 % — ABNORMAL LOW (ref 39.0–52.0)
Hemoglobin: 9.5 g/dL — ABNORMAL LOW (ref 13.0–17.0)
Immature Granulocytes: 5 %
Lymphocytes Relative: 8 %
Lymphs Abs: 1 10*3/uL (ref 0.7–4.0)
MCH: 25.5 pg — ABNORMAL LOW (ref 26.0–34.0)
MCHC: 31.7 g/dL (ref 30.0–36.0)
MCV: 80.4 fL (ref 80.0–100.0)
Monocytes Absolute: 0.9 10*3/uL (ref 0.1–1.0)
Monocytes Relative: 7 %
Neutro Abs: 10.1 10*3/uL — ABNORMAL HIGH (ref 1.7–7.7)
Neutrophils Relative %: 79 %
Platelets: 91 10*3/uL — ABNORMAL LOW (ref 150–400)
RBC: 3.73 MIL/uL — ABNORMAL LOW (ref 4.22–5.81)
RDW: 16.9 % — ABNORMAL HIGH (ref 11.5–15.5)
WBC: 12.8 10*3/uL — ABNORMAL HIGH (ref 4.0–10.5)
nRBC: 1.6 % — ABNORMAL HIGH (ref 0.0–0.2)

## 2019-11-11 LAB — CBC
HCT: 27.5 % — ABNORMAL LOW (ref 39.0–52.0)
Hemoglobin: 8.9 g/dL — ABNORMAL LOW (ref 13.0–17.0)
MCH: 26.1 pg (ref 26.0–34.0)
MCHC: 32.4 g/dL (ref 30.0–36.0)
MCV: 80.6 fL (ref 80.0–100.0)
Platelets: 76 10*3/uL — ABNORMAL LOW (ref 150–400)
RBC: 3.41 MIL/uL — ABNORMAL LOW (ref 4.22–5.81)
RDW: 17 % — ABNORMAL HIGH (ref 11.5–15.5)
WBC: 14.1 10*3/uL — ABNORMAL HIGH (ref 4.0–10.5)
nRBC: 0.4 % — ABNORMAL HIGH (ref 0.0–0.2)

## 2019-11-11 LAB — COMPREHENSIVE METABOLIC PANEL
ALT: 280 U/L — ABNORMAL HIGH (ref 0–44)
AST: 359 U/L — ABNORMAL HIGH (ref 15–41)
Albumin: 2.5 g/dL — ABNORMAL LOW (ref 3.5–5.0)
Alkaline Phosphatase: 190 U/L — ABNORMAL HIGH (ref 38–126)
Anion gap: 13 (ref 5–15)
BUN: 20 mg/dL (ref 8–23)
CO2: 22 mmol/L (ref 22–32)
Calcium: 8.2 mg/dL — ABNORMAL LOW (ref 8.9–10.3)
Chloride: 95 mmol/L — ABNORMAL LOW (ref 98–111)
Creatinine, Ser: 0.83 mg/dL (ref 0.61–1.24)
GFR calc Af Amer: >60 mL/min (ref >60)
GFR calc non Af Amer: >60 mL/min (ref >60)
Glucose, Bld: 145 mg/dL — ABNORMAL HIGH (ref 70–99)
Potassium: 4 mmol/L (ref 3.5–5.1)
Sodium: 130 mmol/L — ABNORMAL LOW (ref 135–145)
Total Bilirubin: 2.2 mg/dL — ABNORMAL HIGH (ref 0.3–1.2)
Total Protein: 7.2 g/dL (ref 6.5–8.1)

## 2019-11-11 LAB — LACTIC ACID, PLASMA
Lactic Acid, Venous: 1.5 mmol/L (ref 0.5–1.9)
Lactic Acid, Venous: 1.7 mmol/L (ref 0.5–1.9)

## 2019-11-11 LAB — BASIC METABOLIC PANEL
Anion gap: 11 (ref 5–15)
BUN: 21 mg/dL (ref 8–23)
CO2: 20 mmol/L — ABNORMAL LOW (ref 22–32)
Calcium: 7.7 mg/dL — ABNORMAL LOW (ref 8.9–10.3)
Chloride: 98 mmol/L (ref 98–111)
Creatinine, Ser: 0.75 mg/dL (ref 0.61–1.24)
GFR calc Af Amer: >60 mL/min (ref >60)
GFR calc non Af Amer: >60 mL/min (ref >60)
Glucose, Bld: 204 mg/dL — ABNORMAL HIGH (ref 70–99)
Potassium: 4.2 mmol/L (ref 3.5–5.1)
Sodium: 129 mmol/L — ABNORMAL LOW (ref 135–145)

## 2019-11-11 LAB — GLUCOSE, CAPILLARY
Glucose-Capillary: 255 mg/dL — ABNORMAL HIGH (ref 70–99)
Glucose-Capillary: 239 mg/dL — ABNORMAL HIGH (ref 70–99)
Glucose-Capillary: 219 mg/dL — ABNORMAL HIGH (ref 70–99)
Glucose-Capillary: 279 mg/dL — ABNORMAL HIGH (ref 70–99)

## 2019-11-11 LAB — C-REACTIVE PROTEIN: CRP: 23.2 mg/dL — ABNORMAL HIGH (ref <1.0)

## 2019-11-11 LAB — LIPASE, BLOOD: Lipase: 16 U/L (ref 11–51)

## 2019-11-11 LAB — HEPATITIS PANEL, ACUTE
HCV Ab: NONREACTIVE
Hep A IgM: NONREACTIVE
Hep B C IgM: NONREACTIVE
Hepatitis B Surface Ag: REACTIVE — AB

## 2019-11-11 LAB — RESPIRATORY PANEL BY RT PCR (FLU A&B, COVID)
Influenza A by PCR: NEGATIVE
Influenza B by PCR: NEGATIVE
SARS Coronavirus 2 by RT PCR: NEGATIVE

## 2019-11-11 LAB — PROTIME-INR
INR: 1.9 — ABNORMAL HIGH (ref 0.8–1.2)
Prothrombin Time: 21.4 seconds — ABNORMAL HIGH (ref 11.4–15.2)

## 2019-11-11 LAB — PROCALCITONIN: Procalcitonin: 1.84 ng/mL

## 2019-11-11 LAB — BRAIN NATRIURETIC PEPTIDE: B Natriuretic Peptide: 151 pg/mL — ABNORMAL HIGH (ref 0.0–100.0)

## 2019-11-11 IMAGING — DX DG FEMUR 1V PORT*R*
2 series · 2 of 2 positions shown · non-contrast
Comparison: MRI 07/25/2018

CLINICAL DATA: Sepsis, pain

EXAM:
RIGHT FEMUR PORTABLE 1 VIEW

[femur ap (1 of 2)]
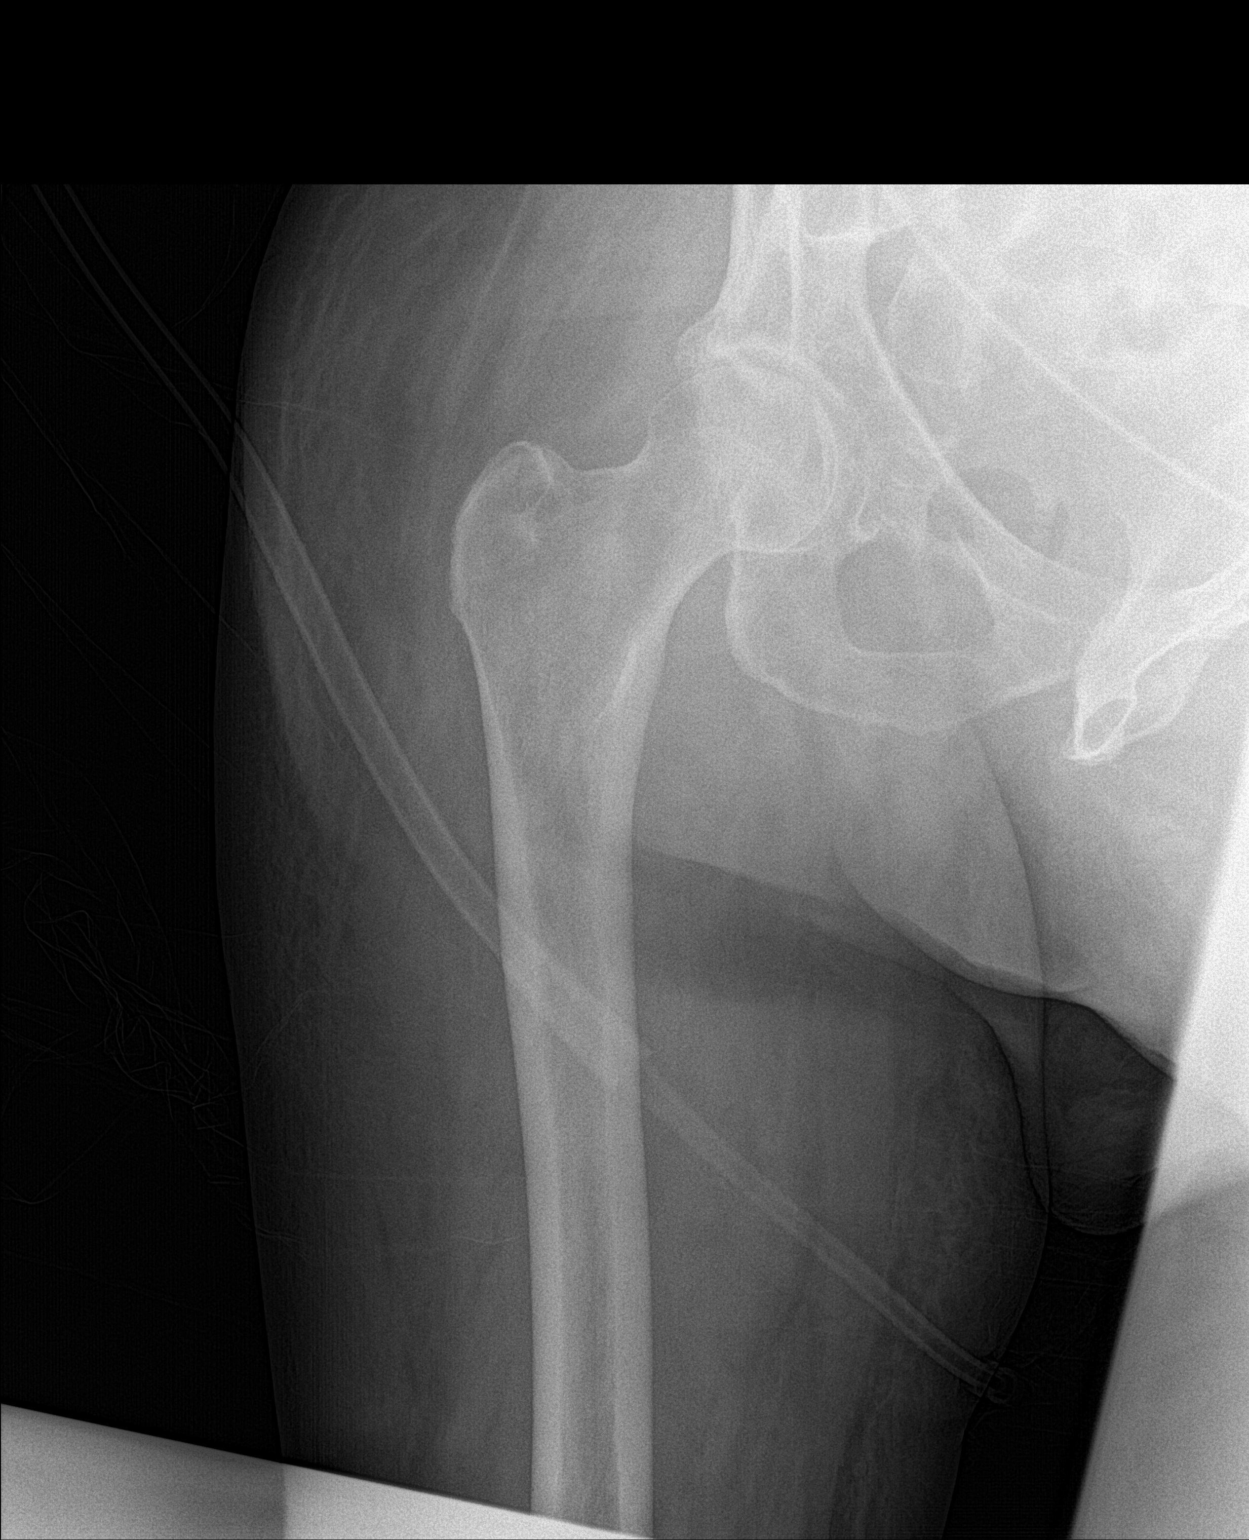

[femur ap (2 of 2)]
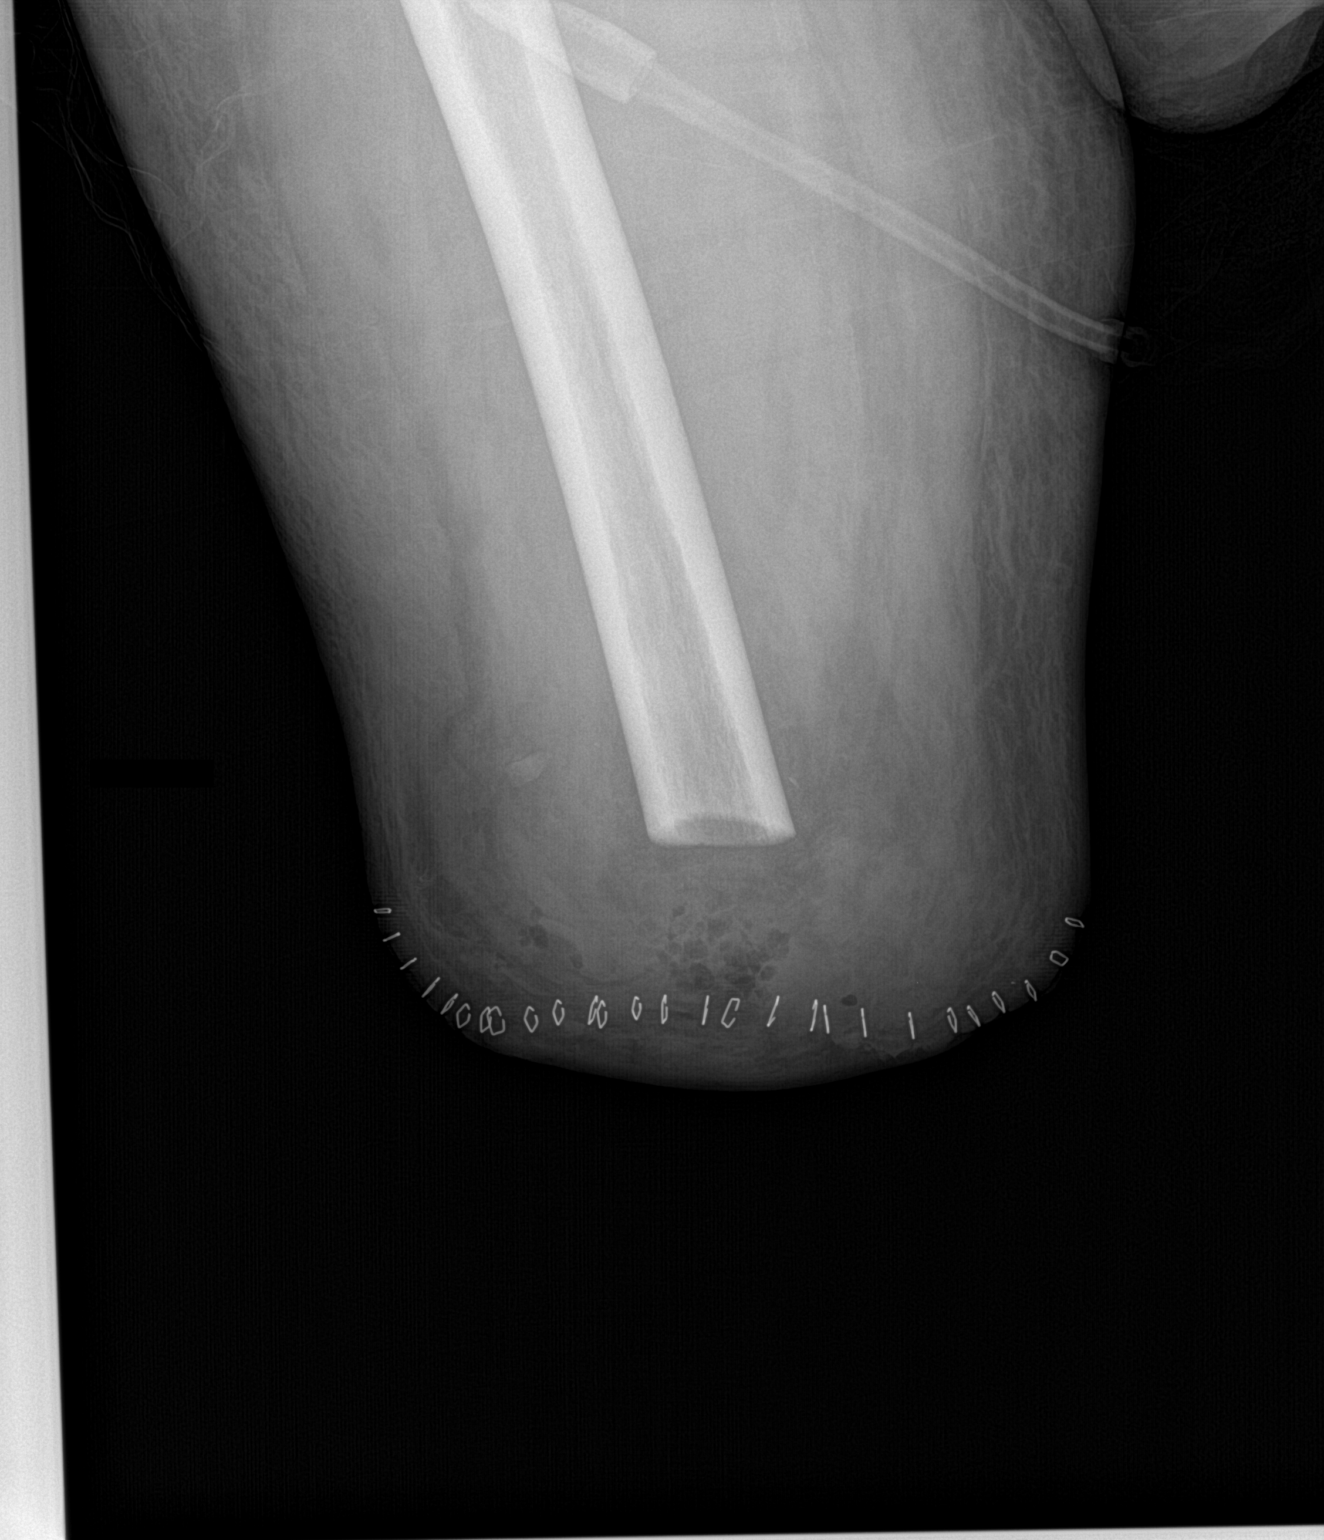

[2 of 2 positions shown; findings below may reference images not displayed]

FINDINGS: Status post above the knee amputation. Gas locules within the stump
soft tissues. Cut edge of the femur is smooth and without
periostitis or gross bone destruction. No fracture identified
IMPRESSION: 1. Status post above the knee amputation without definitive acute
osseous abnormality
2. Small gas locules within the soft tissues distal to the residual
femur, probably postoperative given recent history of AKA

## 2019-11-11 IMAGING — US US ABDOMEN LIMITED
1 series · 14 of 25 positions shown · non-contrast
Comparison: None.

CLINICAL DATA: Initial evaluation for elevated LFTs.

EXAM:
ULTRASOUND ABDOMEN LIMITED RIGHT UPPER QUADRANT

[Series 1: us abdomen limited ruq · 14 of 156 slices shown]
[im 1/156]
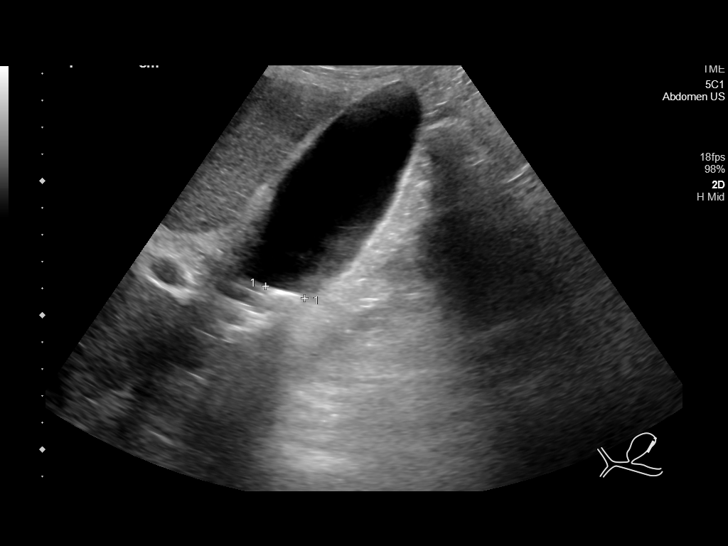
[im 13/156]
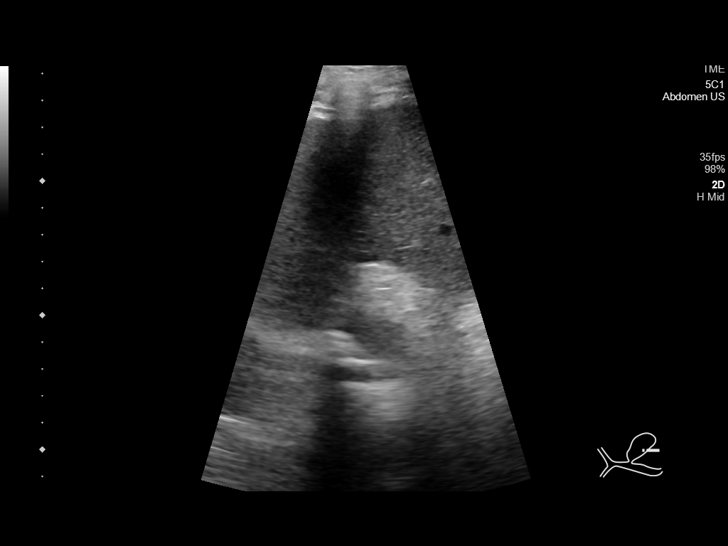
[im 26/156]
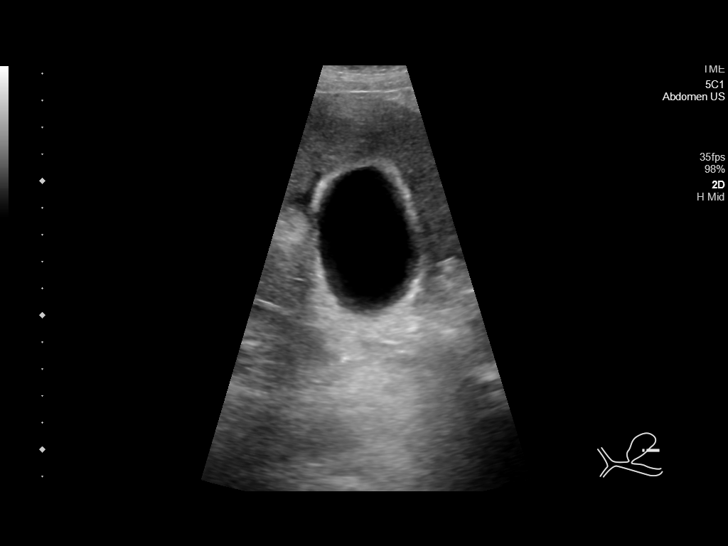
[im 39/156]
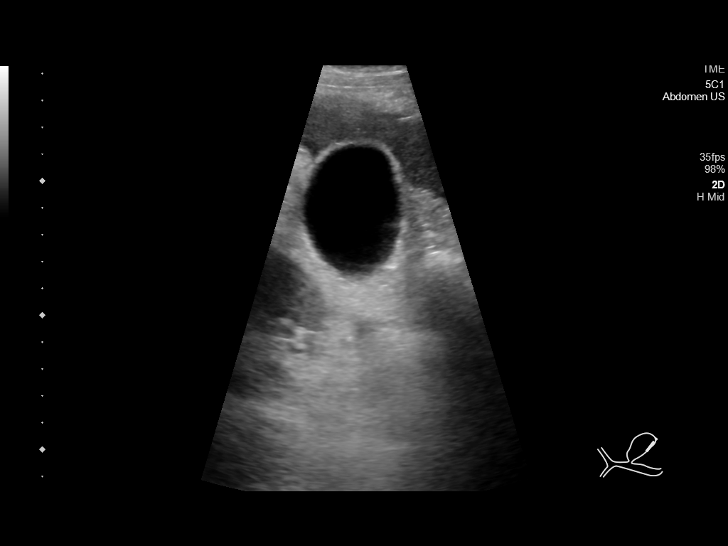
[im 52/156]
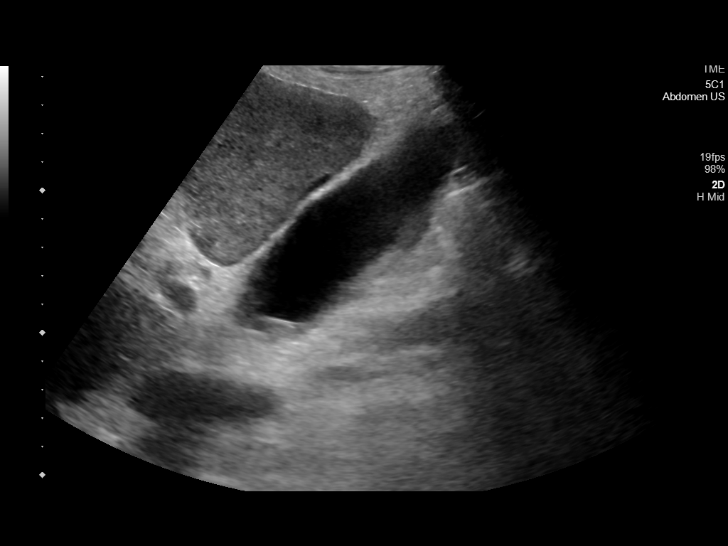
[im 59/156]
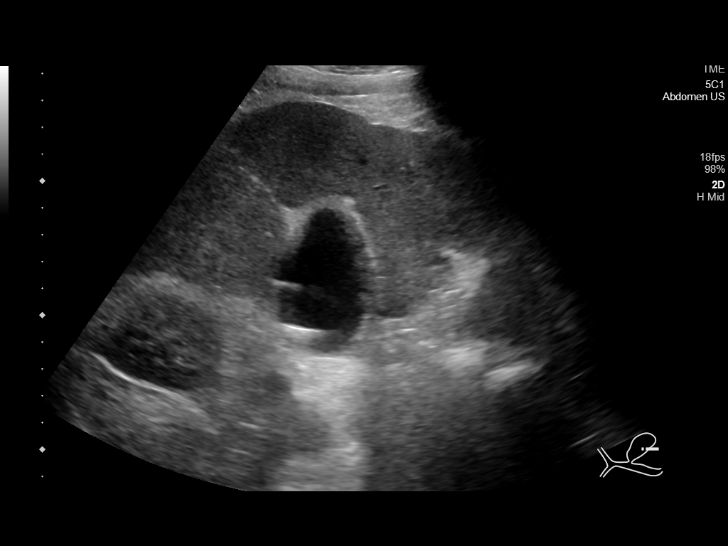
[im 72/156]
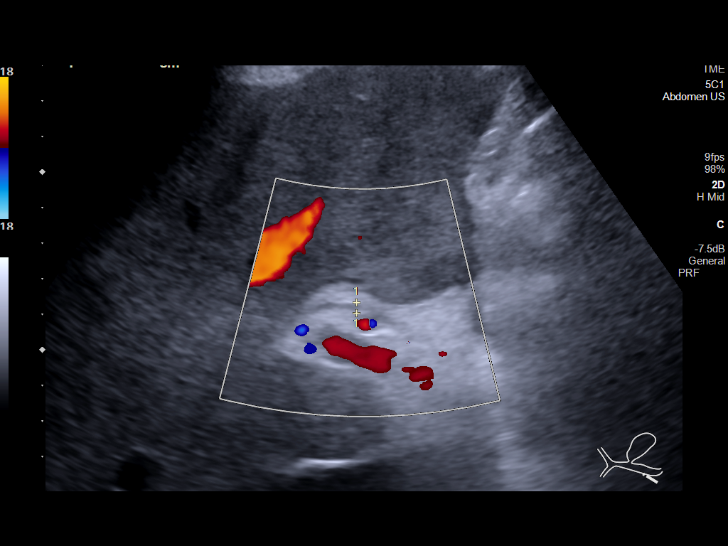
[im 84/156]
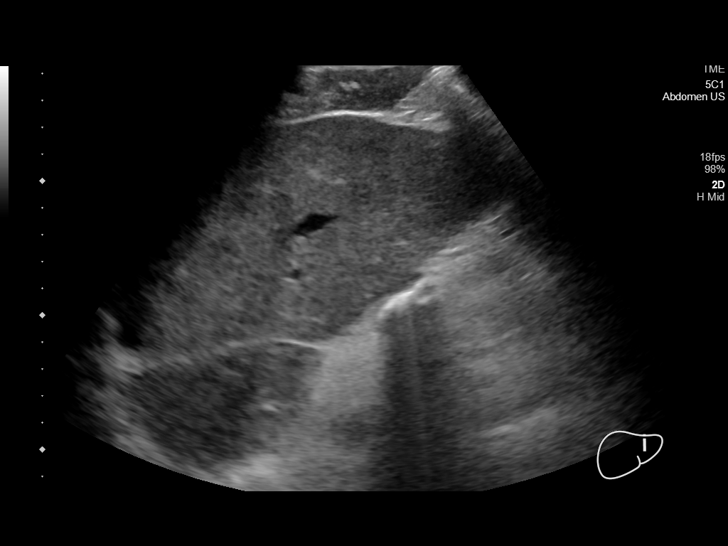
[im 97/156]
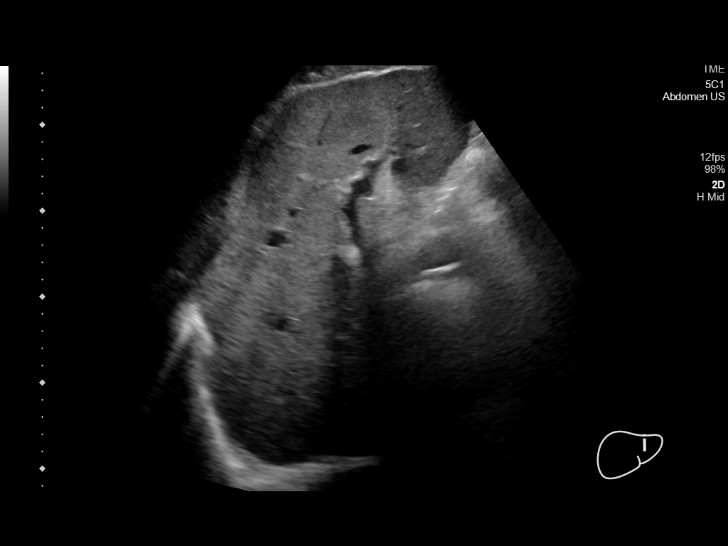
[im 104/156]
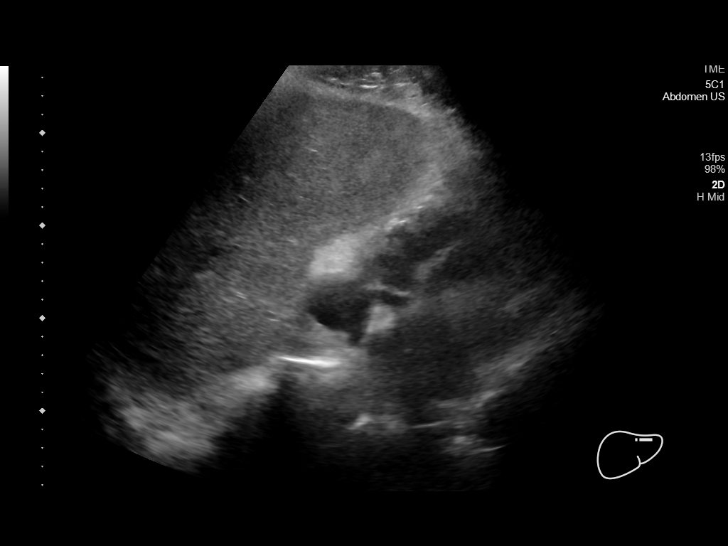
[im 117/156]
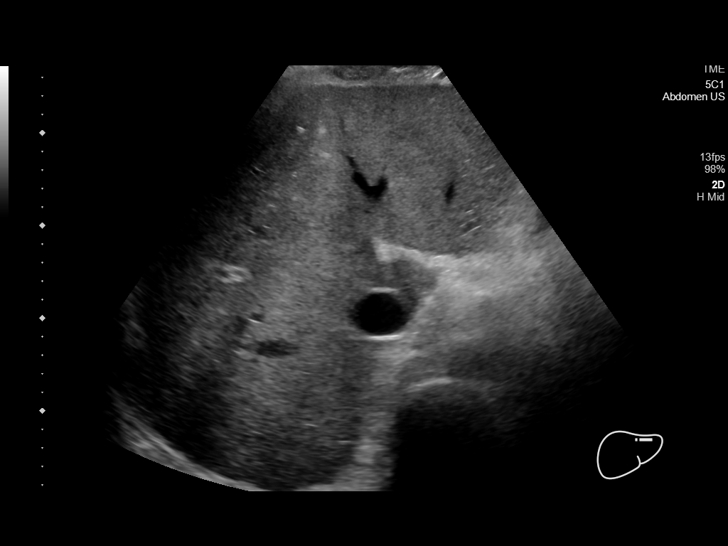
[im 130/156]
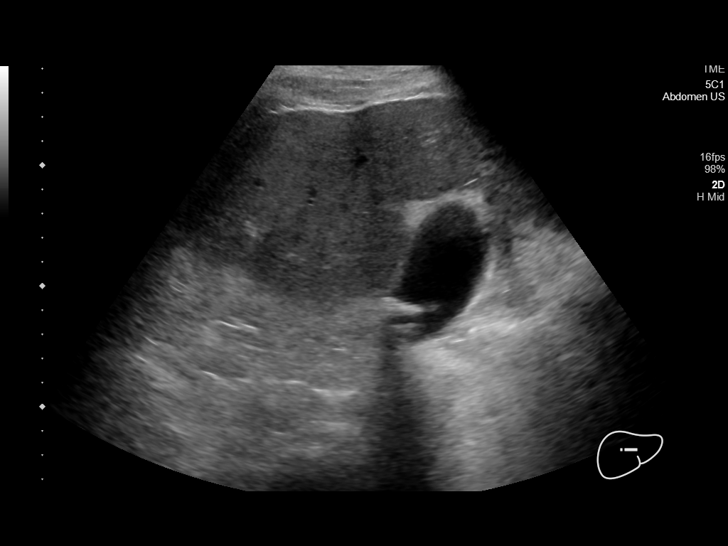
[im 143/156]
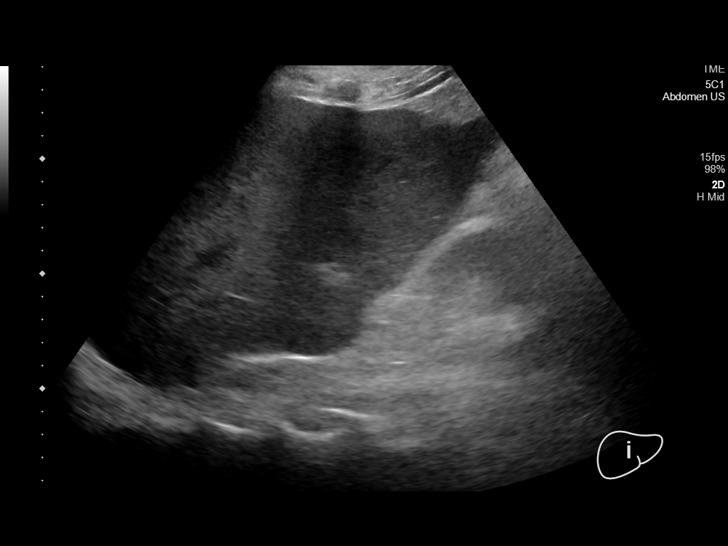
[im 156/156]
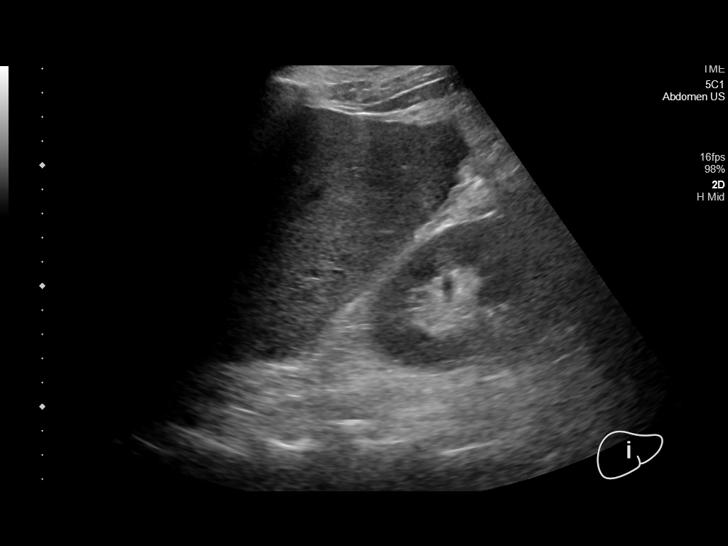

[14 of 25 positions shown; findings below may reference images not displayed]

FINDINGS: Gallbladder:

Few shadowing echogenic stones present within the gallbladder,
largest of which measures 1.5 cm. Gallbladder wall measures within
normal limits at 3 mm. Trace free pericholecystic fluid. No
sonographic Murphy sign indicated by sonographer.

Common bile duct:

Diameter: 3 mm

Liver:

Liver demonstrates a nodular contour with coarse heterogeneous
echotexture. No focal intrahepatic lesions. Portal vein is patent on
color Doppler imaging with normal direction of blood flow towards
the liver.

Other: None.
IMPRESSION: 1. Cholelithiasis with trace free pericholecystic fluid. No other
sonographic features to suggest acute cholecystitis.
2. No biliary dilatation.
3. Coarse heterogeneous echotexture of the liver with nodular
contour, suggesting cirrhosis.

## 2019-11-11 IMAGING — DX DG CHEST 1V PORT
1 series · 2 of 2 positions shown · non-contrast
Comparison: 06/21/2017

CLINICAL DATA: Sepsis

EXAM:
PORTABLE CHEST 1 VIEW

[Series 3: chest pa · 0.14mm/px · 2 of 2 slices shown]
[im 1/2]
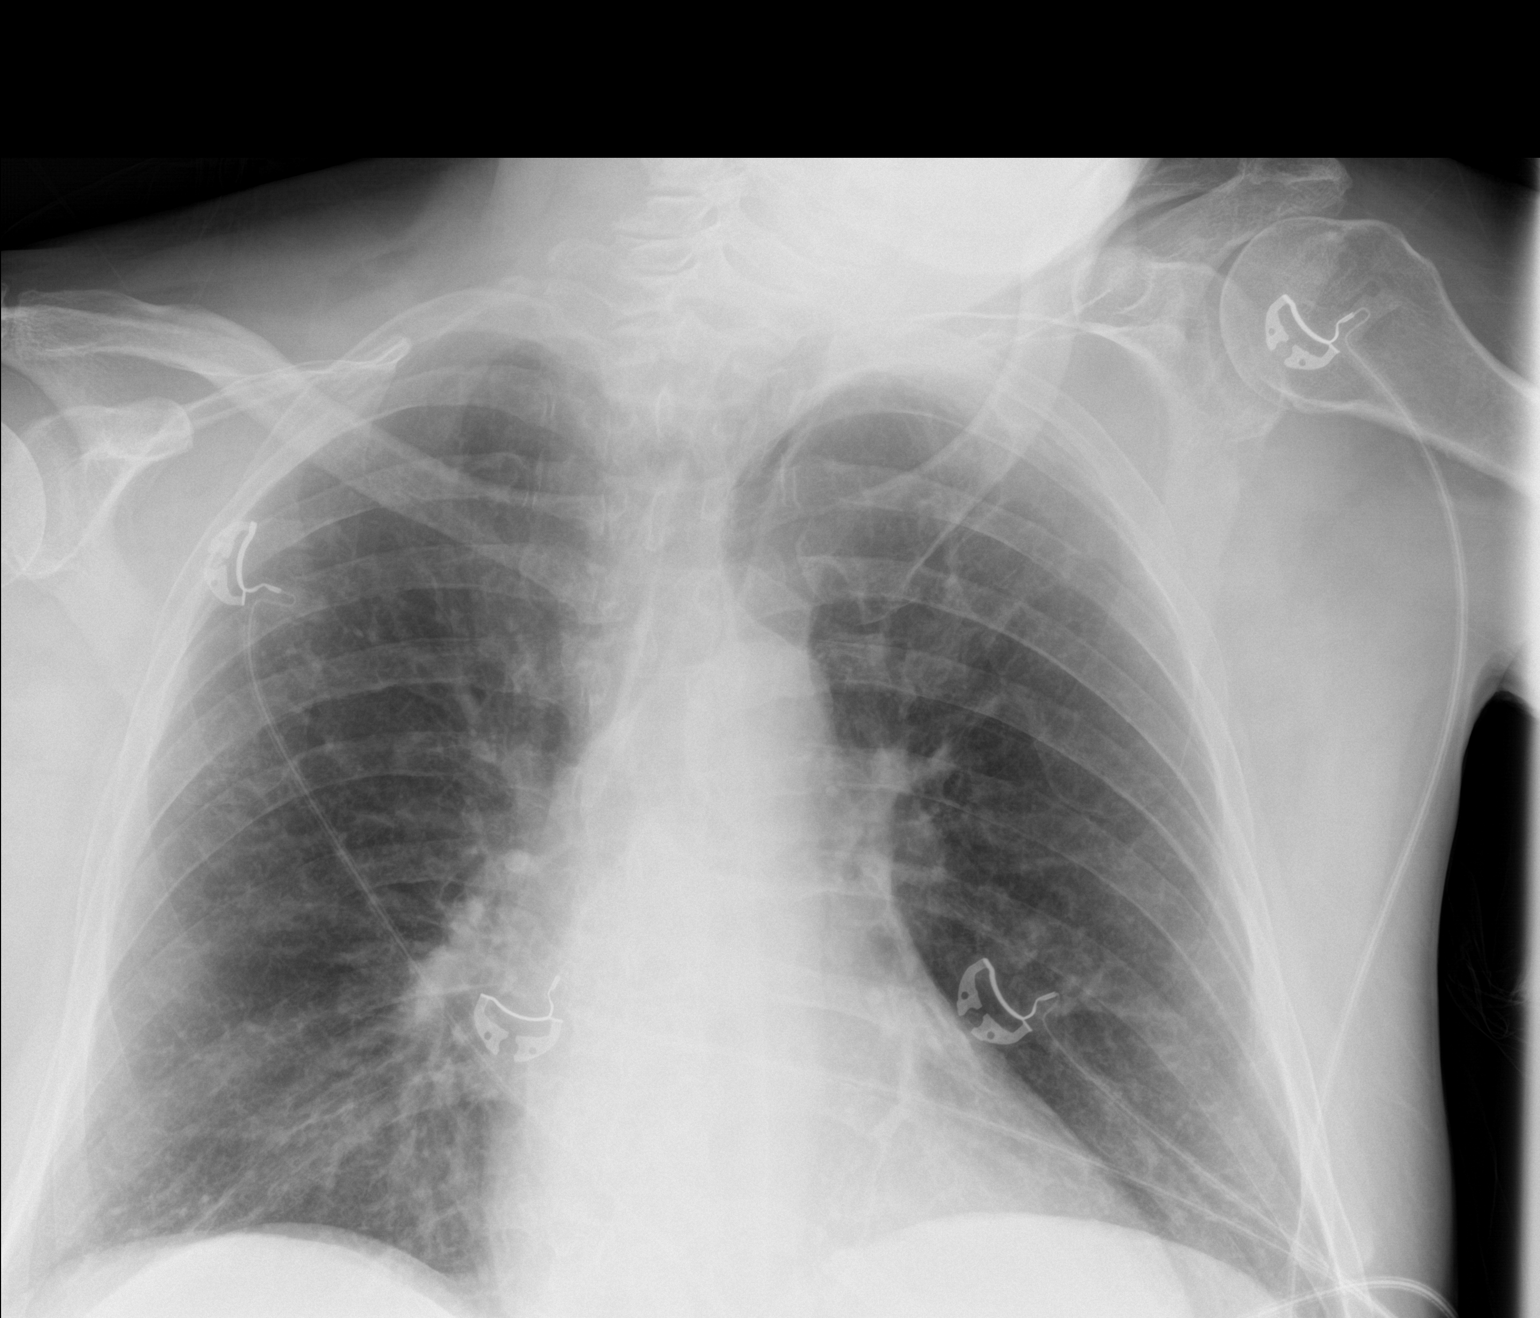
[im 2/2]
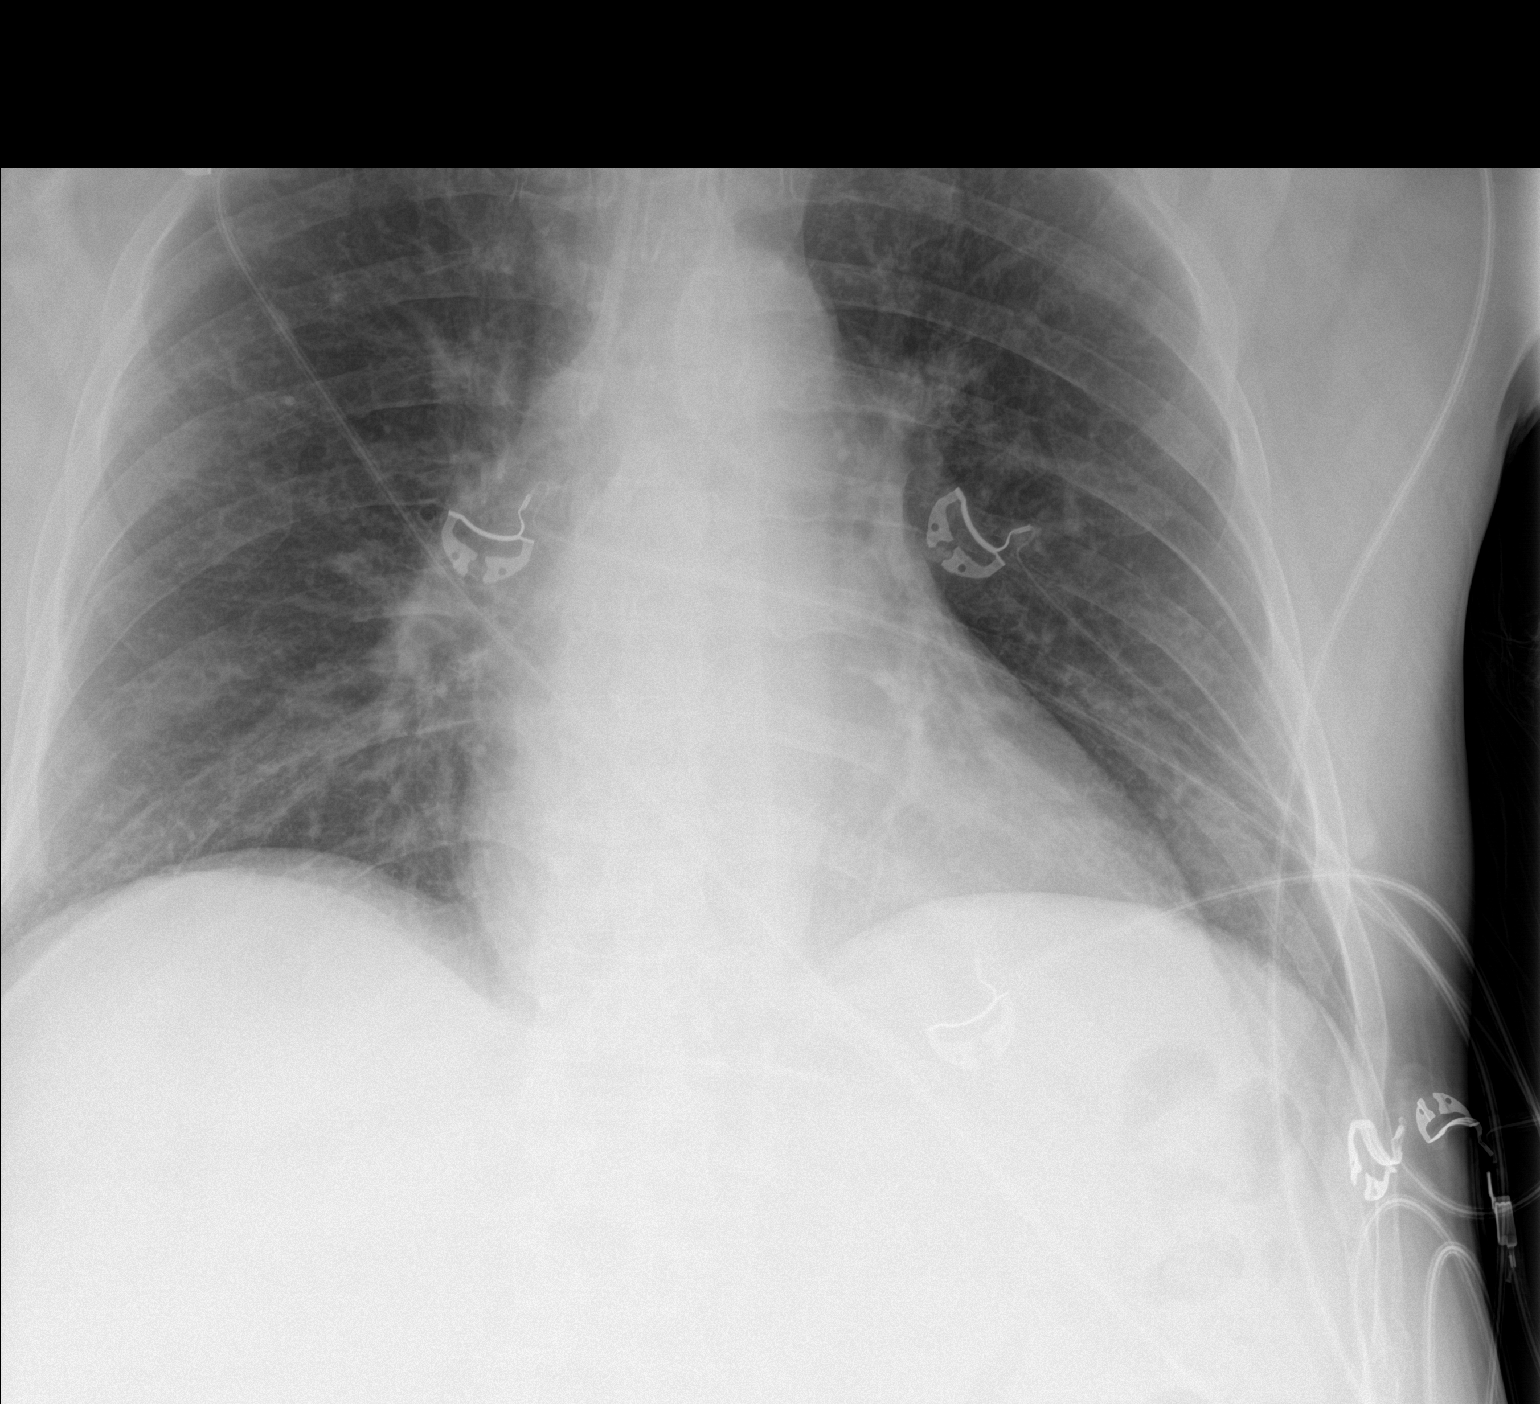

[2 of 2 positions shown; findings below may reference images not displayed]

FINDINGS: No focal airspace disease or effusion. Borderline cardiomegaly. No
pneumothorax.
IMPRESSION: No active disease.

## 2019-11-11 SURGERY — INCISION AND DRAINAGE
Anesthesia: General | Site: Knee | Laterality: Right

## 2019-11-11 MED ORDER — PIPERACILLIN-TAZOBACTAM 3.375 G IVPB 30 MIN: 3.3750 g | Freq: Four times a day (QID) | INTRAVENOUS | Status: DC

## 2019-11-11 MED ORDER — ACETAMINOPHEN 10 MG/ML IV SOLN: 1000.0000 mg | Freq: Once | INTRAVENOUS | Status: DC | PRN

## 2019-11-11 MED ORDER — SIMVASTATIN 10 MG PO TABS: 40.0000 mg | ORAL_TABLET | Freq: Every day | ORAL | Status: DC

## 2019-11-11 MED ORDER — ACETAMINOPHEN 650 MG RE SUPP
650.0000 mg | RECTAL | Status: DC | PRN
  Administered 2019-11-11: 650 mg via RECTAL
  Filled 2019-11-11: qty 1

## 2019-11-11 MED ORDER — DOCUSATE SODIUM 100 MG PO CAPS
100.0000 mg | ORAL_CAPSULE | Freq: Every day | ORAL | Status: DC
  Administered 2019-11-12 – 2019-11-29 (×9): 100 mg via ORAL
  Filled 2019-11-11 (×16): qty 1

## 2019-11-11 MED ORDER — MORPHINE SULFATE (PF) 4 MG/ML IV SOLN
4.0000 mg | Freq: Once | INTRAVENOUS | Status: AC
  Administered 2019-11-11: 4 mg via INTRAVENOUS
  Filled 2019-11-11: qty 1

## 2019-11-11 MED ORDER — ONDANSETRON HCL 4 MG PO TABS
4.0000 mg | ORAL_TABLET | Freq: Four times a day (QID) | ORAL | Status: DC | PRN
  Filled 2019-11-11: qty 1

## 2019-11-11 MED ORDER — VANCOMYCIN HCL IN DEXTROSE 1-5 GM/200ML-% IV SOLN: 1000.0000 mg | Freq: Once | INTRAVENOUS | Status: DC

## 2019-11-11 MED ORDER — ALUM & MAG HYDROXIDE-SIMETH 200-200-20 MG/5ML PO SUSP
30.0000 mL | ORAL | Status: DC | PRN
  Administered 2019-11-19: 30 mL via ORAL
  Filled 2019-11-11 (×2): qty 30

## 2019-11-11 MED ORDER — FENTANYL CITRATE (PF) 100 MCG/2ML IJ SOLN
INTRAMUSCULAR | Status: DC | PRN
  Administered 2019-11-11 (×4): 50 ug via INTRAVENOUS

## 2019-11-11 MED ORDER — LIDOCAINE HCL (CARDIAC) PF 100 MG/5ML IV SOSY
PREFILLED_SYRINGE | INTRAVENOUS | Status: DC | PRN
  Administered 2019-11-11: 80 mg via INTRAVENOUS

## 2019-11-11 MED ORDER — PNEUMOCOCCAL VAC POLYVALENT 25 MCG/0.5ML IJ INJ
0.5000 mL | INJECTION | INTRAMUSCULAR | Status: AC
  Administered 2019-11-16: 0.5 mL via INTRAMUSCULAR
  Filled 2019-11-11: qty 0.5

## 2019-11-11 MED ORDER — EMPAGLIFLOZIN 10 MG PO TABS: 5.0000 mg | ORAL_TABLET | Freq: Every day | ORAL | Status: DC

## 2019-11-11 MED ORDER — TRAZODONE HCL 50 MG PO TABS
25.0000 mg | ORAL_TABLET | Freq: Every evening | ORAL | Status: DC | PRN
  Administered 2019-11-11 – 2019-11-26 (×7): 25 mg via ORAL
  Filled 2019-11-11 (×7): qty 1

## 2019-11-11 MED ORDER — PIPERACILLIN-TAZOBACTAM 3.375 G IVPB
3.3750 g | Freq: Three times a day (TID) | INTRAVENOUS | Status: DC
  Administered 2019-11-11 – 2019-11-13 (×8): 3.375 g via INTRAVENOUS
  Filled 2019-11-11 (×8): qty 50

## 2019-11-11 MED ORDER — ACETAMINOPHEN 650 MG RE SUPP: 650.0000 mg | Freq: Four times a day (QID) | RECTAL | Status: DC | PRN

## 2019-11-11 MED ORDER — MIDAZOLAM HCL 2 MG/2ML IJ SOLN
INTRAMUSCULAR | Status: AC
  Filled 2019-11-11: qty 2

## 2019-11-11 MED ORDER — BACITRACIN 50000 UNITS IM SOLR
INTRAMUSCULAR | Status: AC
  Filled 2019-11-11: qty 3

## 2019-11-11 MED ORDER — MORPHINE SULFATE (PF) 2 MG/ML IV SOLN
2.0000 mg | INTRAVENOUS | Status: DC | PRN
  Administered 2019-11-11: 04:00:00 2 mg via INTRAVENOUS
  Filled 2019-11-11: qty 1

## 2019-11-11 MED ORDER — INSULIN ASPART 100 UNIT/ML ~~LOC~~ SOLN: 0.0000 [IU] | Freq: Three times a day (TID) | SUBCUTANEOUS | Status: DC

## 2019-11-11 MED ORDER — ADULT MULTIVITAMIN W/MINERALS CH
1.0000 | ORAL_TABLET | Freq: Every day | ORAL | Status: DC
  Administered 2019-11-11 – 2019-11-29 (×19): 1 via ORAL
  Filled 2019-11-11 (×19): qty 1

## 2019-11-11 MED ORDER — VITAMIN D 25 MCG (1000 UNIT) PO TABS
5000.0000 [IU] | ORAL_TABLET | Freq: Every day | ORAL | Status: DC
  Administered 2019-11-12 – 2019-11-29 (×17): 5000 [IU] via ORAL
  Filled 2019-11-11 (×20): qty 5

## 2019-11-11 MED ORDER — MORPHINE SULFATE (PF) 2 MG/ML IV SOLN
2.0000 mg | INTRAVENOUS | Status: DC | PRN
  Administered 2019-11-11: 21:00:00 2 mg via INTRAVENOUS
  Filled 2019-11-11: qty 1

## 2019-11-11 MED ORDER — SODIUM CHLORIDE 0.9 % IV SOLN: INTRAVENOUS | Status: DC | PRN

## 2019-11-11 MED ORDER — OXYCODONE HCL 5 MG/5ML PO SOLN: 5.0000 mg | Freq: Once | ORAL | Status: DC | PRN

## 2019-11-11 MED ORDER — DILTIAZEM HCL-DEXTROSE 125-5 MG/125ML-% IV SOLN (PREMIX)
5.0000 mg/h | INTRAVENOUS | Status: DC
  Administered 2019-11-11: 5 mg/h via INTRAVENOUS
  Administered 2019-11-12: 15 mg/h via INTRAVENOUS
  Filled 2019-11-11 (×3): qty 125

## 2019-11-11 MED ORDER — ASPIRIN EC 81 MG PO TBEC
81.0000 mg | DELAYED_RELEASE_TABLET | Freq: Every day | ORAL | Status: DC
  Administered 2019-11-11: 10:00:00 81 mg via ORAL
  Filled 2019-11-11: qty 1

## 2019-11-11 MED ORDER — FERROUS SULFATE 325 (65 FE) MG PO TABS
325.0000 mg | ORAL_TABLET | Freq: Two times a day (BID) | ORAL | Status: DC
  Administered 2019-11-12 – 2019-11-29 (×35): 325 mg via ORAL
  Filled 2019-11-11 (×39): qty 1

## 2019-11-11 MED ORDER — GABAPENTIN 600 MG PO TABS
1200.0000 mg | ORAL_TABLET | Freq: Three times a day (TID) | ORAL | Status: DC
  Administered 2019-11-11 – 2019-11-29 (×52): 1200 mg via ORAL
  Filled 2019-11-11 (×57): qty 2

## 2019-11-11 MED ORDER — SODIUM CHLORIDE (PF) 0.9 % IJ SOLN
INTRAMUSCULAR | Status: AC
  Filled 2019-11-11: qty 50

## 2019-11-11 MED ORDER — METOPROLOL TARTRATE 25 MG PO TABS
25.0000 mg | ORAL_TABLET | Freq: Two times a day (BID) | ORAL | Status: DC
  Administered 2019-11-11 – 2019-11-16 (×9): 25 mg via ORAL
  Filled 2019-11-11 (×12): qty 1

## 2019-11-11 MED ORDER — FENTANYL CITRATE (PF) 100 MCG/2ML IJ SOLN
INTRAMUSCULAR | Status: AC
  Filled 2019-11-11: qty 2

## 2019-11-11 MED ORDER — MIDAZOLAM HCL 2 MG/2ML IJ SOLN
INTRAMUSCULAR | Status: DC | PRN
  Administered 2019-11-11: 1 mg via INTRAVENOUS

## 2019-11-11 MED ORDER — ONDANSETRON HCL 4 MG/2ML IJ SOLN
4.0000 mg | Freq: Four times a day (QID) | INTRAMUSCULAR | Status: DC | PRN
  Administered 2019-11-11 (×2): 4 mg via INTRAVENOUS
  Filled 2019-11-11: qty 2

## 2019-11-11 MED ORDER — ASCORBIC ACID 500 MG PO TABS
500.0000 mg | ORAL_TABLET | Freq: Every day | ORAL | Status: DC
  Administered 2019-11-11 – 2019-11-29 (×19): 500 mg via ORAL
  Filled 2019-11-11 (×20): qty 1

## 2019-11-11 MED ORDER — DILTIAZEM HCL 25 MG/5ML IV SOLN
5.0000 mg | Freq: Once | INTRAVENOUS | Status: AC
  Administered 2019-11-11: 04:00:00 5 mg via INTRAVENOUS
  Filled 2019-11-11: qty 5

## 2019-11-11 MED ORDER — OXYCODONE HCL 5 MG PO TABS: 5.0000 mg | ORAL_TABLET | Freq: Once | ORAL | Status: DC | PRN

## 2019-11-11 MED ORDER — SODIUM CHLORIDE 0.9 % IV SOLN
INTRAVENOUS | Status: DC | PRN
  Administered 2019-11-11: 250 mL via INTRAVENOUS
  Administered 2019-11-17: 1000 mL via INTRAVENOUS

## 2019-11-11 MED ORDER — EZETIMIBE 10 MG PO TABS: 10.0000 mg | ORAL_TABLET | Freq: Every day | ORAL | Status: DC

## 2019-11-11 MED ORDER — MAGNESIUM HYDROXIDE 400 MG/5ML PO SUSP: 30.0000 mL | Freq: Every day | ORAL | Status: DC | PRN

## 2019-11-11 MED ORDER — HYDROMORPHONE HCL 1 MG/ML IJ SOLN
1.0000 mg | INTRAMUSCULAR | Status: AC | PRN
  Administered 2019-11-11 (×2): 1 mg via INTRAVENOUS
  Filled 2019-11-11 (×2): qty 1

## 2019-11-11 MED ORDER — OXYCODONE HCL 5 MG PO TABS
5.0000 mg | ORAL_TABLET | Freq: Three times a day (TID) | ORAL | Status: DC | PRN
  Administered 2019-11-11 – 2019-11-12 (×2): 5 mg via ORAL
  Filled 2019-11-11 (×2): qty 1

## 2019-11-11 MED ORDER — VANCOMYCIN HCL 1250 MG/250ML IV SOLN
1250.0000 mg | Freq: Two times a day (BID) | INTRAVENOUS | Status: DC
  Administered 2019-11-12 – 2019-11-17 (×11): 1250 mg via INTRAVENOUS
  Filled 2019-11-11 (×15): qty 250

## 2019-11-11 MED ORDER — ONDANSETRON HCL 4 MG/2ML IJ SOLN
INTRAMUSCULAR | Status: AC
  Filled 2019-11-11: qty 2

## 2019-11-11 MED ORDER — EZETIMIBE-SIMVASTATIN 10-40 MG PO TABS: 1.0000 | ORAL_TABLET | Freq: Every day | ORAL | Status: DC

## 2019-11-11 MED ORDER — INSULIN ASPART 100 UNIT/ML ~~LOC~~ SOLN
0.0000 [IU] | Freq: Three times a day (TID) | SUBCUTANEOUS | Status: DC
  Administered 2019-11-11 (×2): 8 [IU] via SUBCUTANEOUS
  Administered 2019-11-11 – 2019-11-12 (×2): 5 [IU] via SUBCUTANEOUS
  Administered 2019-11-12: 11 [IU] via SUBCUTANEOUS
  Administered 2019-11-12 – 2019-11-13 (×5): 3 [IU] via SUBCUTANEOUS
  Administered 2019-11-13: 5 [IU] via SUBCUTANEOUS
  Administered 2019-11-14 (×2): 3 [IU] via SUBCUTANEOUS
  Administered 2019-11-14 – 2019-11-15 (×3): 2 [IU] via SUBCUTANEOUS
  Administered 2019-11-15 (×2): 3 [IU] via SUBCUTANEOUS
  Administered 2019-11-15 – 2019-11-16 (×3): 2 [IU] via SUBCUTANEOUS
  Administered 2019-11-16 (×2): 3 [IU] via SUBCUTANEOUS
  Administered 2019-11-17 (×2): 2 [IU] via SUBCUTANEOUS
  Administered 2019-11-17: 3 [IU] via SUBCUTANEOUS
  Administered 2019-11-17: 2 [IU] via SUBCUTANEOUS
  Administered 2019-11-18: 3 [IU] via SUBCUTANEOUS
  Administered 2019-11-18: 2 [IU] via SUBCUTANEOUS
  Administered 2019-11-18 – 2019-11-19 (×2): 3 [IU] via SUBCUTANEOUS
  Administered 2019-11-19 (×2): 2 [IU] via SUBCUTANEOUS
  Administered 2019-11-19: 3 [IU] via SUBCUTANEOUS
  Administered 2019-11-20: 2 [IU] via SUBCUTANEOUS
  Administered 2019-11-20: 3 [IU] via SUBCUTANEOUS
  Administered 2019-11-20 – 2019-11-21 (×6): 2 [IU] via SUBCUTANEOUS
  Administered 2019-11-22 (×2): 3 [IU] via SUBCUTANEOUS
  Administered 2019-11-22 – 2019-11-23 (×2): 2 [IU] via SUBCUTANEOUS
  Administered 2019-11-23: 3 [IU] via SUBCUTANEOUS
  Administered 2019-11-23 – 2019-11-24 (×3): 2 [IU] via SUBCUTANEOUS
  Administered 2019-11-24: 3 [IU] via SUBCUTANEOUS
  Administered 2019-11-25 (×2): 2 [IU] via SUBCUTANEOUS
  Administered 2019-11-25 – 2019-11-26 (×2): 3 [IU] via SUBCUTANEOUS
  Administered 2019-11-26 – 2019-11-29 (×8): 2 [IU] via SUBCUTANEOUS
  Administered 2019-11-29: 3 [IU] via SUBCUTANEOUS
  Filled 2019-11-11 (×63): qty 1

## 2019-11-11 MED ORDER — FENTANYL CITRATE (PF) 100 MCG/2ML IJ SOLN: 25.0000 ug | INTRAMUSCULAR | Status: DC | PRN

## 2019-11-11 MED ORDER — VANCOMYCIN HCL IN DEXTROSE 1-5 GM/200ML-% IV SOLN
1000.0000 mg | Freq: Once | INTRAVENOUS | Status: AC
  Administered 2019-11-11: 1000 mg via INTRAVENOUS
  Filled 2019-11-11: qty 200

## 2019-11-11 MED ORDER — NYSTATIN 100000 UNIT/GM EX POWD
Freq: Two times a day (BID) | CUTANEOUS | Status: AC
  Filled 2019-11-11 (×2): qty 15

## 2019-11-11 MED ORDER — PROPOFOL 10 MG/ML IV BOLUS
INTRAVENOUS | Status: DC | PRN
  Administered 2019-11-11: 100 mg via INTRAVENOUS

## 2019-11-11 MED ORDER — ACETAMINOPHEN 325 MG PO TABS
650.0000 mg | ORAL_TABLET | Freq: Four times a day (QID) | ORAL | Status: DC | PRN
  Administered 2019-11-18 – 2019-11-24 (×3): 650 mg via ORAL
  Filled 2019-11-11 (×3): qty 2

## 2019-11-11 MED ORDER — HYDROGEN PEROXIDE 3 % EX SOLN
CUTANEOUS | Status: DC | PRN
  Administered 2019-11-11: 1 via TOPICAL

## 2019-11-11 MED ORDER — ONDANSETRON HCL 4 MG/2ML IJ SOLN: 4.0000 mg | Freq: Once | INTRAMUSCULAR | Status: DC | PRN

## 2019-11-11 MED ORDER — PANTOPRAZOLE SODIUM 20 MG PO TBEC
20.0000 mg | DELAYED_RELEASE_TABLET | Freq: Every day | ORAL | Status: DC
  Administered 2019-11-11 – 2019-11-29 (×19): 20 mg via ORAL
  Filled 2019-11-11 (×19): qty 1

## 2019-11-11 MED ORDER — PROPOFOL 10 MG/ML IV BOLUS
INTRAVENOUS | Status: AC
  Filled 2019-11-11: qty 40

## 2019-11-11 SURGICAL SUPPLY — 41 items
BASIN GRAD PLASTIC 32OZ STRL (MISCELLANEOUS) ×2 IMPLANT
BLADE SAGITTAL WIDE XTHICK NO (BLADE) ×2 IMPLANT
BNDG COHESIVE 4X5 TAN STRL (GAUZE/BANDAGES/DRESSINGS) ×2 IMPLANT
BNDG ELASTIC 4X5.8 VLCR STR LF (GAUZE/BANDAGES/DRESSINGS) ×2 IMPLANT
BNDG ELASTIC 6X5.8 VLCR NS LF (GAUZE/BANDAGES/DRESSINGS) ×2 IMPLANT
BNDG GAUZE 4.5X4.1 6PLY STRL (MISCELLANEOUS) ×6 IMPLANT
BRUSH SCRUB EZ  4% CHG (MISCELLANEOUS) ×1
BRUSH SCRUB EZ 4% CHG (MISCELLANEOUS) ×1 IMPLANT
CANISTER SUCT 1200ML W/VALVE (MISCELLANEOUS) ×2 IMPLANT
CHLORAPREP W/TINT 26ML (MISCELLANEOUS) ×2 IMPLANT
COVER WAND RF STERILE (DRAPES) ×2 IMPLANT
DRAPE INCISE IOBAN 66X45 STRL (DRAPES) ×2 IMPLANT
DRAPE INCISE IOBAN 66X60 STRL (DRAPES) ×2 IMPLANT
DRSG VAC ATS LRG SENSATRAC (GAUZE/BANDAGES/DRESSINGS) ×2 IMPLANT
ELECT CAUTERY BLADE 6.4 (BLADE) ×2 IMPLANT
ELECT REM PT RETURN 9FT ADLT (ELECTROSURGICAL) ×2
ELECTRODE REM PT RTRN 9FT ADLT (ELECTROSURGICAL) ×1 IMPLANT
GAUZE XEROFORM 1X8 LF (GAUZE/BANDAGES/DRESSINGS) ×4 IMPLANT
GLOVE BIO SURGEON STRL SZ7 (GLOVE) ×4 IMPLANT
GLOVE INDICATOR 7.5 STRL GRN (GLOVE) ×2 IMPLANT
GOWN STRL REUS W/ TWL LRG LVL3 (GOWN DISPOSABLE) ×1 IMPLANT
GOWN STRL REUS W/ TWL XL LVL3 (GOWN DISPOSABLE) ×2 IMPLANT
GOWN STRL REUS W/TWL LRG LVL3 (GOWN DISPOSABLE) ×1
GOWN STRL REUS W/TWL XL LVL3 (GOWN DISPOSABLE) ×2
HANDLE YANKAUER SUCT BULB TIP (MISCELLANEOUS) ×2 IMPLANT
KIT TURNOVER KIT A (KITS) ×2 IMPLANT
LABEL OR SOLS (LABEL) ×2 IMPLANT
NS IRRIG 1000ML POUR BTL (IV SOLUTION) ×2 IMPLANT
PACK EXTREMITY ARMC (MISCELLANEOUS) ×2 IMPLANT
PAD ABD DERMACEA PRESS 5X9 (GAUZE/BANDAGES/DRESSINGS) ×4 IMPLANT
PAD PREP 24X41 OB/GYN DISP (PERSONAL CARE ITEMS) ×2 IMPLANT
SPONGE LAP 18X18 RF (DISPOSABLE) ×4 IMPLANT
STAPLER SKIN PROX 35W (STAPLE) ×2 IMPLANT
STOCKINETTE M/LG 89821 (MISCELLANEOUS) ×2 IMPLANT
SUT SILK 2 0 (SUTURE)
SUT SILK 2 0 SH (SUTURE) IMPLANT
SUT SILK 2-0 18XBRD TIE 12 (SUTURE) IMPLANT
SUT VIC AB 0 CT1 36 (SUTURE) IMPLANT
SUT VIC AB 2-0 CT1 (SUTURE) IMPLANT
SUT VIC AB 3-0 SH 27 (SUTURE) ×2
SUT VIC AB 3-0 SH 27X BRD (SUTURE) ×2 IMPLANT

## 2019-11-11 NOTE — Consult Note (Signed)
[place] Nurse Consult Note: Reason for Consult:Infected right stump Wound type: Infectious  WOC Nursing team is simultaneous consulted with Vascular surgery.  Dr. Lorenso Courier from that service has seen today and plan is to take to OR today for revision of stump.  There is no role at this time for [place].  We will not see and will not follow,  but will remain available to this patient, the nursing, surgical and medical teams.  Please re-consult if needed.  Thanks, Maudie Flakes, MSN, RN, [place], Arther Abbott  Pager# [phone]

## 2019-11-11 NOTE — Progress Notes (Signed)
I have reviewed the charting completed by the Student-RN and I agree with his assessments.  Leonna Schlee A Fletcher Rathbun, RN 

## 2019-11-11 NOTE — OR Nursing (Signed)
Patient arrived via stretcher to [place] room 16. Patient unable to tell me what surgery he was having.  Wife needed to give him quese to help him but he still remained uncertain as to the reason for surgery.  Washed scrotum and penis d/t visible blood. Tip of penis is swollen and very tender to touch.  Informed Dr. Bertell Maria and Dr. Thurston Hole that patient had his Eliquis last pm and his aspirin this morning.  VSS.

## 2019-11-11 NOTE — ED Notes (Signed)
CBG obtained by this RN at this time, pt tolerated well. Pt denies any needs at this time. This RN apologized for patient not receiving breakfast tray. Pt states understanding. Pt remains alert, speech remains at baseline. Call remains in reach of patient at this time.

## 2019-11-11 NOTE — ED Notes (Signed)
This RN to bedside, leads placed back on patient. Pt awakens easily. Pt noted to have continued expressive aphasia, requesting that his door not be closed. Pt visualized otherwise in NAD. Pt remains in A-fib with a controled rote 90-104 on Diltiazem drip a rate of 10mg /hr.

## 2019-11-11 NOTE — ED Notes (Signed)
Admitting MD at bedside at this time.

## 2019-11-11 NOTE — ED Notes (Signed)
This RN to bedside, introduced self to patient and wife. Dilt drip adjusted per titration protocols, pt's HR noted to be 118-127, A-fib on the monitor. Pt's wife at bedside. Pt c/o continued pain 5/10 to RLE. Pt otherwise with stable VS. Pt with noted 200cc's of urine. While this RN at bedside with noted word searching, pt's wife reports baseline from 2 previous strokes.

## 2019-11-11 NOTE — Transfer of Care (Signed)
Immediate Anesthesia Transfer of Care Note  Patient: [name].  Procedure(s) Performed: INCISION AND DRAINAGE RIGHT ABOVE THE KNEE STUMP (Right Knee)  Patient Location: PACU and [name] Stay  Anesthesia Type:General  Level of Consciousness: patient cooperative and responds to stimulation  Airway & Oxygen Therapy: Patient Spontanous Breathing and Patient connected to nasal cannula oxygen  Post-op Assessment: Report given to RN and Post -op Vital signs reviewed and stable  Post vital signs: Reviewed and stable  Last Vitals:  Vitals Value Taken Time  BP 136/88 1732  Temp 36   Pulse 110   Resp 18   SpO2 96     Last Pain:  Vitals:   11/11/19 1554  TempSrc: Temporal  PainSc: 8          Complications: No apparent anesthesia complications

## 2019-11-11 NOTE — Anesthesia Preprocedure Evaluation (Signed)
Anesthesia Evaluation  Patient identified by MRN, date of birth, ID band Patient awake and Patient confused    Reviewed: Allergy & Precautions, H&P , NPO status , Patient's Chart, lab work & pertinent test results, reviewed documented beta blocker date and time   History of Anesthesia Complications (+) history of anesthetic complications  Airway Mallampati: II  TM Distance: >3 FB Neck ROM: full    Dental  (+) Edentulous Upper, Poor Dentition, Chipped, Missing, Dental Advisory Given Very poor dentition, few remaining lower teeth. None loose.:   Pulmonary neg pulmonary ROS, Patient abstained from smoking., former smoker,    Pulmonary exam normal        Cardiovascular Exercise Tolerance: Poor hypertension, + CAD and + Peripheral Vascular Disease  + dysrhythmias Atrial Fibrillation  Rhythm:irregular Rate:Normal  2019 ECHO Study Conclusions   - Left ventricle: The cavity size was normal. Systolic function was  normal. The estimated ejection fraction was in the range of 60%  to 65%. Wall motion was normal; there were no regional wall  motion abnormalities. Left ventricular diastolic function  parameters were normal.  - Aortic valve: Poorly visualized. grossly appears trileaflet;  normal thickness leaflets.  - Aortic root: The aortic root was mild to moderately dilated, 4.1  cm  - Ascending aorta: The ascending aorta was mildly dilated. 3.5 cm  - Mitral valve: There was mild regurgitation.  - Right ventricle: Systolic function was normal.  - Pulmonary arteries: Systolic pressure was within the normal  range.    Neuro/Psych  Headaches,  Neuromuscular disease CVA, Residual Symptoms negative psych ROS   GI/Hepatic GERD  Controlled and Medicated,  Endo/Other  diabetes  Renal/GU Renal disease     Musculoskeletal   Abdominal   Peds  Hematology  (+) Blood dyscrasia, anemia , Patient last took eliquis  yesterday. Has progressive thrombocytopenia, anemia, and elevated INR compared to last month.   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 08/2018 CT Chest: Mild cor Ca2+. No date: Diabetes mellitus with complication ([place]) No date: Dilated aortic root ([place])     Comment:  a. 04/2018 Echo: 4.1cm. Asc Ao 3.5cm; b. 04/2018 CT: Asc               Ao 3.6cm. 4.1cm @ sinus of Valsalva. No date: Dysrhythmia     Comment:  a fib 06/02/2018: Gangrene of right foot ([place]) No date: GERD (gastroesophageal reflux disease) No date: History of echocardiogram     Comment:  a. 04/2018 Echo: Ef 60-65%, no rwma, midly to mod dil Ao               root - 4.1cm. Asc Ao 3.5cm. Mild MR. Nl RV fxn. Nl PASP. No date: History of hernia repair No date: History of stress test     Comment:  a. 2016 MV ([name]): EF 58%, no ischemia. 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 on Eliquis as of 2018; b. CHADS2VASc => 5 (HTN, DM,               stroke x 2, vascular disease) No date: Peripheral vascular disease ([place])     Comment:  a. followed by Dr. Lucky Cowboy; b. s/p kissing balloon stents               and right external iliac stent in 10/2017; c. 05/2018 s/p               R  BKA; c. 08/2019: Bilat Iliac PTA & DBA or REIA. No date: Pulmonary nodules No date: Stroke [facility])     Comment:  a. 2016 & 2018 07/11/2018: Stroke [facility]) Past Surgical History: No date: ABDOMINAL AORTA STENT     Comment:  x 2 06/02/2018: AMPUTATION; Right     Comment:  Procedure: AMPUTATION BELOW KNEE;  Surgeon: [name], MD;  Location: [facility] ORS;  Service: Vascular;                Laterality: Right; No date: APPENDECTOMY 05/12/2018: COLONOSCOPY WITH PROPOFOL; N/A     Comment:  Procedure: COLONOSCOPY WITH PROPOFOL;  Surgeon: [name], MD;  Location: [facility] ENDOSCOPY;  Service:               Gastroenterology;  Laterality: N/A; 05/12/2018: ESOPHAGOGASTRODUODENOSCOPY (EGD) WITH PROPOFOL; N/A     Comment:  Procedure: ESOPHAGOGASTRODUODENOSCOPY (EGD) WITH               PROPOFOL;  Surgeon: [name], MD;  Location: [facility]               ENDOSCOPY;  Service: Gastroenterology;  Laterality: N/A; 07/13/2018: GIVENS CAPSULE STUDY; N/A     Comment:  Procedure: GIVENS CAPSULE STUDY;  Surgeon: [name],               MD;  Location: [facility] ENDOSCOPY;  Service:               Gastroenterology;  Laterality: N/A; No date: HERNIA REPAIR     Comment:  UMBILICAL [id number]: LOWER EXTREMITY ANGIOGRAPHY; Right     Comment:  Procedure: LOWER EXTREMITY ANGIOGRAPHY;  Surgeon: [name], MD;  Location: [place] CV LAB;  Service:               Cardiovascular;  Laterality: Right; 01/13/2018: LOWER EXTREMITY ANGIOGRAPHY; Right     Comment:  Procedure: LOWER EXTREMITY ANGIOGRAPHY;  Surgeon: [name], MD;  Location: [place] CV LAB;  Service:               Cardiovascular;  Laterality: Right; 08/28/2019: LOWER EXTREMITY ANGIOGRAPHY; Right     Comment:  Procedure: LOWER EXTREMITY ANGIOGRAPHY;  Surgeon: [name], MD;  Location: [place] CV LAB;  Service:  Cardiovascular;  Laterality: Right; 10/25/2017: LOWER  EXTREMITY INTERVENTION     Comment:  Procedure: LOWER EXTREMITY INTERVENTION;  Surgeon: [name], MD;  Location: [place] CV LAB;  Service:               Cardiovascular;; 12/28/2018: SKIN SPLIT GRAFT; Right     Comment:  Procedure: SKIN GRAFT SPLIT THICKNESS ( SYNTHETIC );                Surgeon: [name], MD;  Location: [facility] ORS;  Service:              Vascular;  Laterality: Right; No date: TONSILLECTOMY 08/08/2018: WOUND DEBRIDEMENT; Right     Comment:  Procedure: DEBRIDEMENT WOUND WITH WOUND VAC APPLICATION;              Surgeon: [name], MD;  Location: [facility] ORS;  Service:              Vascular;  Laterality: Right;   Reproductive/Obstetrics                             Anesthesia Physical  Anesthesia Plan  ASA: III  Anesthesia Plan: General   Post-op Pain Management:    Induction: Intravenous  PONV Risk Score and Plan: 2 and Ondansetron and Treatment may vary due to age or medical condition  Airway Management Planned: LMA  Additional Equipment: None  Intra-op Plan:   Post-operative Plan: Extubation in OR  Informed Consent: I have reviewed the patients History and Physical, chart, labs and discussed the procedure including the risks, benefits and alternatives for the proposed anesthesia with the patient or authorized representative who has indicated his/her understanding and acceptance.     Dental Advisory Given  Plan Discussed with: CRNA  Anesthesia Plan Comments: (Discussed risks of anesthesia with patient and wife at bedside (patient has expressive aphasia after stroke so wife gave consent as well as patient), including PONV, sore throat, lip/dental damage. Rare risks discussed as well, such as cardiorespiratory and neurological sequelae. Patient and wife understands.  Last ate at 730am,)        Anesthesia Quick Evaluation

## 2019-11-11 NOTE — ED Notes (Signed)
Assisted pt with using urinal °

## 2019-11-11 NOTE — ED Notes (Signed)
This RN to bedside due to patient repeatedly calling out. Pt repeatedly stating, "Please help me I don't feel good", pt unable to state how he would like help, or how he doesn't feel good. Pt denies pain, denies nausea, pt does endorse being uncomfortable, pt repositioned in bed at this time by this RN.

## 2019-11-11 NOTE — Progress Notes (Signed)
Pharmacy Antibiotic Note  Jabali Everage. is a 64 y.o. male admitted on 11/10/2019 with foul and purulence from AKA site.  Pharmacy has been consulted for vancomycin dosing.  Plan: Patient received vanc 2g IV load in ED  Vancomycin 1250 mg IV Q 12 hrs. Goal AUC 400-550. Expected AUC: 502.5 SCr used: 0.83 Cssmin: 14.2  Will continue to monitor and adjust dose as needed.  Height: 5\' 9"  (175.3 cm) Weight: 88.5 kg (195 lb 1.7 oz) IBW/kg (Calculated) : 70.7  Temp (24hrs), Avg:101.3 F (38.5 C), Min:100.6 F (38.1 C), Max:101.9 F (38.8 C)  Recent Labs  Lab 11/10/19 2357  WBC 12.8*  CREATININE 0.83  LATICACIDVEN 1.7    Estimated Creatinine Clearance: 97.6 mL/min (by C-G formula based on SCr of 0.83 mg/dL).    Allergies  Allergen Reactions  . Sodium Pentobarbital [Pentobarbital] Shortness Of Breath  . Lipitor [Atorvastatin] Rash    Thank you for allowing pharmacy to be a part of this patient's care.  Tobie Lords, PharmD, BCPS Clinical Pharmacist 11/11/2019 2:03 AM

## 2019-11-11 NOTE — ED Notes (Signed)
Dr. Lorenso Courier at bedside at this time to discuss taking patient back to OR to clean out infection to RLE stump.

## 2019-11-11 NOTE — Progress Notes (Signed)
PROGRESS NOTE    Glenn B Howley Jr.  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 Continue on IV vanco & zosyn. Blood cxs NGTD. Right leg culture pending. Urine cx pending. Will go for I&D, debridement & possible revision as per vascular surgery. Wound care consulted. Plavix and aspirin will be held off for potential surgical intervention.  Sepsis: secondary to above. Management as stated above   Atrial fibrillation: possibly PAF w/ RVR. Continue on IV cadizem drip. Continue to hold plavix for potential surgical intervention.  Thrombocytopenia: etiology unclear, medication ADR (aspirin, eliquis) vs infection vs HIT (unknown if pt had heparin products prior to admission) vs DIC. Pathologist smear, PTT, fibrinogen, d-dimer ordered. Will hold home dose of aspirin & eliquis   Normocytic anemia: no need for a transfusion at this time. Will continue to monitor   Leukocytosis: likely secondary to infection. Continue on IV abxs   Transaminitis: hepatitis panel pending. RUQ US shows cholelithiasis w/ trace free pericholecystic fluid, no other evidence to acute cholecystitis   Metabolic acidosis: will start bicarb  Hyponatremia: likely hypovolemic. Continue on IVFs   DM2: will continue on SSI w/ accuchecks. Will continue to hold jardiance and metformin   Dyslipidemia: continue to hold vytorin secondary to transaminitis    DVT prophylaxis: SCDs Code Status: full  Family Communication:  Disposition Plan:  Depends on PT/OT   Consultants:   Vascular surgery    Procedures:    Antimicrobials: zosyn    Subjective: Pt is oriented to self only   Objective: Vitals:   11/11/19 0430 11/11/19 0500 11/11/19 0611 11/11/19 0728  BP: 116/82 105/89 (!) 144/88   Pulse: (!) 120 (!) 129 (!) 12  (!) 127  Resp: 12 17 18 12   Temp:   98.3 F (36.8 C)   TempSrc:   Oral   SpO2: 96% 98% 97% 95%  Weight:      Height:        Intake/Output Summary (Last 24 hours) at 11/11/2019 0801 Last data filed at 11/11/2019 0730 Gross per 24 hour  Intake 200 ml  Output 200 ml  Net 0 ml   Filed Weights   11/10/19 2350  Weight: 88.5 kg    Examination:  General exam: Appears calm and comfortable  Respiratory system: diminished breath sounds b/l. Cardiovascular system: irregularly irregular. No rubs, gallops or clicks.  Gastrointestinal system: Abdomen is nondistended, soft and nontender. Normal bowel sounds heard. Central nervous system: Alert and oriented to self only  Psychiatry: Judgement and insight appear abnormal. Flat mood and affect.     Data Reviewed: I have personally reviewed following labs and imaging studies  CBC: Recent Labs  Lab 11/10/19 2357 11/11/19 0352  WBC 12.8* 14.1*  NEUTROABS 10.1*  --   HGB 9.5* 8.9*  HCT 30.0* 27.5*  MCV 80.4 80.6  PLT 91* 76*   Basic Metabolic Panel: Recent Labs  Lab 11/10/19 2357 11/11/19 0352  NA 130* 129*  K 4.0 4.2  CL 95* 98  CO2 22 20*  GLUCOSE 145* 204*  BUN 20 21  CREATININE 0.83 0.75  CALCIUM 8.2* 7.7*   GFR: Estimated Creatinine Clearance: 101.3 mL/min (by C-G formula based on SCr of 0.75 mg/dL). Liver Function Tests: Recent Labs  Lab 11/10/19 2357  AST 359*  ALT 280*  ALKPHOS 190*  BILITOT 2.2*  PROT 7.2  ALBUMIN 2.5*   Recent Labs  Lab 11/10/19 2357  LIPASE 16   No results for input(s): AMMONIA in the last 168 hours. Coagulation Profile: Recent Labs  Lab 11/10/19 2357  INR 1.9*   Cardiac Enzymes: No results for input(s): CKTOTAL, CKMB, CKMBINDEX, TROPONINI in the last 168 hours. BNP (last 3 results) No results for input(s): PROBNP in the last 8760 hours. HbA1C: No results for input(s): HGBA1C in the last 72 hours. CBG: No results for input(s): GLUCAP in the last 168 hours. Lipid  Profile: No results for input(s): CHOL, HDL, LDLCALC, TRIG, CHOLHDL, LDLDIRECT in the last 72 hours. Thyroid Function Tests: No results for input(s): TSH, T4TOTAL, FREET4, T3FREE, THYROIDAB in the last 72 hours. Anemia Panel: No results for input(s): VITAMINB12, FOLATE, FERRITIN, TIBC, IRON, RETICCTPCT in the last 72 hours. Sepsis Labs: Recent Labs  Lab 11/10/19 2357 11/11/19 0352  PROCALCITON 1.84  --   LATICACIDVEN 1.7 1.5    Recent Results (from the past 240 hour(s))  Respiratory Panel by RT PCR (Flu A&B, Covid) - Nasopharyngeal Swab     Status: None   Collection Time: 11/11/19  1:46 AM   Specimen: Nasopharyngeal Swab  Result Value Ref Range Status   SARS Coronavirus 2 by RT PCR NEGATIVE NEGATIVE Final    Comment: (NOTE) SARS-CoV-2 target nucleic acids are NOT DETECTED. The SARS-CoV-2 RNA is generally detectable in upper respiratoy specimens during the acute phase of infection. The lowest concentration of SARS-CoV-2 viral copies this assay can detect is 131 copies/mL. A negative result does not preclude SARS-Cov-2 infection and should not be used as the sole basis for treatment or other patient management decisions. A negative result may occur with  improper specimen collection/handling, submission of specimen other than nasopharyngeal swab, presence of viral mutation(s) within the areas targeted by this assay, and inadequate number of viral copies (<131 copies/mL). A negative result must be combined with clinical observations, patient history, and epidemiological information. The expected result is Negative. Fact Sheet for Patients:  PinkCheek.be Fact Sheet for Healthcare Providers:  GravelBags.it This test is not yet ap proved or cleared by the [place] FDA and  has been authorized for detection and/or diagnosis of SARS-CoV-2 by FDA under an Emergency Use Authorization (EUA). This EUA will remain  in effect  (meaning this test can be used) for the duration of the COVID-19 declaration under Section 564(b)(1) of the Act, 21 U.S.C. section 360bbb-3(b)(1), unless the authorization is terminated or revoked sooner.    Influenza A by PCR NEGATIVE NEGATIVE Final   Influenza B by PCR NEGATIVE NEGATIVE Final    Comment: (NOTE) The Xpert Xpress SARS-CoV-2/FLU/RSV assay is intended as an aid in  the diagnosis of influenza from Nasopharyngeal swab specimens and  should not be used as a sole basis for treatment. Nasal washings and  aspirates are unacceptable for Xpert Xpress SARS-CoV-2/FLU/RSV  testing. Fact Sheet for Patients: PinkCheek.be Fact Sheet for Healthcare Providers: GravelBags.it This test is not yet approved or cleared by the [place] FDA and  has been authorized for detection and/or diagnosis of SARS-CoV-2 by  FDA under an Emergency Use Authorization (EUA). This EUA will remain  in effect (meaning this test can be used) for the duration of the  Covid-19 declaration under Section 564(b)(1) of the Act, 21  U.S.C. section 360bbb-3(b)(1), unless the authorization is  terminated or revoked. Performed at [facility], [address], [place],  [address]  Radiology Studies: DG Chest Port 1 View  Result Date: 11/11/2019 CLINICAL DATA:  Sepsis EXAM: PORTABLE CHEST 1 VIEW COMPARISON:  06/21/2017 FINDINGS: No focal airspace disease or effusion. Borderline cardiomegaly. No pneumothorax. IMPRESSION: No active disease. Electronically Signed   By: Donavan Foil M.D.   On: 11/11/2019 00:40   DG Femur Portable 1 View Right  Result Date: 11/11/2019 CLINICAL DATA:  Sepsis, pain EXAM: RIGHT FEMUR PORTABLE 1 VIEW COMPARISON:  MRI 07/25/2018 FINDINGS: Status post above the knee amputation. Gas locules within the stump soft tissues. Cut edge of the femur is smooth and without periostitis or gross bone destruction. No  fracture identified IMPRESSION: 1. Status post above the knee amputation without definitive acute osseous abnormality 2. Small gas locules within the soft tissues distal to the residual femur, probably postoperative given recent history of AKA Electronically Signed   By: Donavan Foil M.D.   On: 11/11/2019 00:43   US Abdomen Limited RUQ  Result Date: 11/11/2019 CLINICAL DATA:  Initial evaluation for elevated LFTs. EXAM: ULTRASOUND ABDOMEN LIMITED RIGHT UPPER QUADRANT COMPARISON:  None. FINDINGS: Gallbladder: Few shadowing echogenic stones present within the gallbladder, largest of which measures 1.5 cm. Gallbladder wall measures within normal limits at 3 mm. Trace free pericholecystic fluid. No sonographic Murphy sign indicated by sonographer. Common bile duct: Diameter: 3 mm Liver: Liver demonstrates a nodular contour with coarse heterogeneous echotexture. No focal intrahepatic lesions. Portal vein is patent on color Doppler imaging with normal direction of blood flow towards the liver. Other: None. IMPRESSION: 1. Cholelithiasis with trace free pericholecystic fluid. No other sonographic features to suggest acute cholecystitis. 2. No biliary dilatation. 3. Coarse heterogeneous echotexture of the liver with nodular contour, suggesting cirrhosis. Electronically Signed   By: Jeannine Boga M.D.   On: 11/11/2019 05:50        Scheduled Meds: . vitamin C  500 mg Oral Daily  . aspirin EC  81 mg Oral Daily  . cholecalciferol  5,000 Units Oral Daily  . docusate sodium  100 mg Oral Daily  . ferrous sulfate  325 mg Oral BID WC  . gabapentin  1,200 mg Oral TID  . insulin aspart  0-15 Units Subcutaneous TID PC & HS  . metoprolol tartrate  25 mg Oral BID  . multivitamin with minerals  1 tablet Oral Daily  . pantoprazole  20 mg Oral Daily   Continuous Infusions: . diltiazem (CARDIZEM) infusion 10 mg/hr (11/11/19 0728)  . piperacillin-tazobactam (ZOSYN)  IV 3.375 g (11/11/19 0616)  . vancomycin        LOS: 0 days    Time spent: 32 mins     Wyvonnia Dusky, MD [facility] Pager [phone]  If 7PM-7AM, please contact night-coverage [email or web address] 11/11/2019, 8:01 AM

## 2019-11-11 NOTE — Consult Note (Signed)
[facility] Vascular Consult Note  MRN : [id number]  Ogle [name]. is a 64 y.o. ([birth year]) male who presents with chief complaint of infection of right AKA stump Chief Complaint  Patient presents with  . Code Sepsis  .  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 Noted increased swelling, redness and pain over a week ago with progressive drainage. The patient refused to come in until today when the pain became significant with increased drainage.  Current Facility-Administered Medications  Medication Dose Route Frequency Provider Last Rate Last Admin  . acetaminophen (TYLENOL) tablet 650 mg  650 mg Oral Q6H PRN Mansy, Jan A, MD       Or  . acetaminophen (TYLENOL) suppository 650 mg  650 mg Rectal Q6H PRN Mansy, Jan A, MD      . alum & mag hydroxide-simeth (MAALOX/MYLANTA) 200-200-20 MG/5ML suspension 30 mL  30 mL Oral Q4H PRN Mansy, Jan A, MD      . ascorbic acid (VITAMIN C) tablet 500 mg  500 mg Oral Daily Mansy, Jan A, MD   500 mg at 11/11/19 1020  . aspirin EC tablet 81 mg  81 mg Oral Daily Mansy, Jan A, MD   81 mg at 11/11/19 1020  . cholecalciferol (VITAMIN D3) tablet 5,000 Units  5,000 Units Oral Daily Mansy, Jan A, MD      . diltiazem (CARDIZEM) 125 mg in dextrose 5% 125 mL (1 mg/mL) infusion  5-15 mg/hr Intravenous Titrated Mansy, Jan A, MD 10 mL/hr at 11/11/19 1018 10 mg/hr at 11/11/19 1018  . docusate sodium (COLACE) capsule 100 mg  100 mg Oral Daily Mansy, Jan A, MD      . ferrous sulfate tablet 325 mg  325 mg Oral BID WC Mansy, Jan A, MD      . gabapentin (NEURONTIN) tablet 1,200 mg  1,200 mg Oral TID Mansy, Jan A, MD   1,200 mg at 11/11/19 1020  . insulin aspart (novoLOG) injection 0-15 Units  0-15 Units Subcutaneous TID PC & HS Mansy, Arvella Merles, MD   8 Units at 11/11/19 1019  . magnesium hydroxide (MILK OF MAGNESIA) suspension 30 mL  30  mL Oral Daily PRN Mansy, Jan A, MD      . metoprolol tartrate (LOPRESSOR) tablet 25 mg  25 mg Oral BID Mansy, Arvella Merles, MD   Stopped at 11/11/19 0344  . morphine 2 MG/ML injection 2 mg  2 mg Intravenous Q4H PRN Mansy, Jan A, MD      . multivitamin with minerals tablet 1 tablet  1 tablet Oral Daily Mansy, Jan A, MD   1 tablet at 11/11/19 1021  . ondansetron (ZOFRAN) tablet 4 mg  4 mg Oral Q6H PRN Mansy, Jan A, MD       Or  . ondansetron [facility]) injection 4 mg  4 mg Intravenous Q6H PRN Mansy, Jan A, MD   4 mg at 11/11/19 0338  . oxyCODONE (Oxy IR/ROXICODONE) immediate release tablet 5 mg  5 mg Oral Q8H PRN Mansy, Jan A, MD      . pantoprazole (PROTONIX) EC tablet 20 mg  20 mg Oral Daily Mansy, Jan A, MD   20 mg at 11/11/19 1021  . piperacillin-tazobactam (ZOSYN) IVPB 3.375 g  3.375 g Intravenous Q8H Mansy, Arvella Merles, MD   Stopped at 11/11/19 1016  . traZODone (DESYREL) tablet 25 mg  25 mg Oral QHS PRN Mansy, Jan A, MD   25 mg at 11/11/19 0341  . vancomycin (VANCOREADY) IVPB 1250 mg/250 mL  1,250 mg Intravenous Q12H Mansy, Arvella Merles, MD       Current Outpatient Medications  Medication Sig Dispense Refill  . acetaminophen (TYLENOL) 500 MG tablet Take 1,000 mg by mouth every 6 (six) hours as needed (for pain.).    Marland Kitchen apixaban (ELIQUIS) 5 MG TABS tablet Take 1 tablet (5 mg total) by mouth 2 (two) times daily. 180 tablet 1  . aspirin 81 MG EC tablet Take 1 tablet (81 mg total) by mouth daily. Swallow whole. 90 tablet 3  . cholecalciferol (VITAMIN D3) 25 MCG (1000 UT) tablet Take 5,000 Units by mouth daily.     Marland Kitchen docusate sodium (COLACE) 100 MG capsule Take 1 capsule (100 mg total) by mouth daily. Do not take if you have loose stools 90 capsule 2  . empagliflozin (JARDIANCE) 10 MG TABS tablet Take 5 mg by mouth daily. 90 tablet 1  . ezetimibe-simvastatin (VYTORIN) 10-40 MG tablet TAKE 1 TABLET BY MOUTH DAILY AT 6 PM. 90 tablet 0  . ferrous sulfate (FERROUSUL) 325 (65 FE) MG tablet Take 1 tablet (325 mg total) by  mouth 2 (two) times daily with a meal. 120 tablet 3  . gabapentin (NEURONTIN) 800 MG tablet Take 1.5 tablets (1,200 mg total) by mouth 3 (three) times daily. 405 tablet 1  . hydrochlorothiazide (MICROZIDE) 12.5 MG capsule TAKE 1 CAPSULE BY MOUTH EVERY DAY 90 capsule 1  . lansoprazole (PREVACID) 30 MG capsule Take 30 mg by mouth daily at 12 noon.    . metFORMIN (GLUCOPHAGE) 1000 MG tablet Take 1 tablet (1,000 mg total) by mouth 2 (two) times daily with a meal. 180 tablet 3  . metoprolol tartrate (LOPRESSOR) 25 MG tablet Take 1 tablet (25 mg total) by mouth 2 (two) times daily. 180 tablet 3  . Multiple Vitamin (MULTIVITAMIN WITH MINERALS) TABS tablet Take 1 tablet by mouth daily. 30 tablet 0  . ONE TOUCH ULTRA TEST test strip USE TO TEST BLOOD SUGAR BID 100 each 12  . ONETOUCH DELICA LANCETS 99991111 MISC USE AS DIRECTED TO TEST BLOOD SUGAR BID 100 each 12  . oxyCODONE (OXY IR/ROXICODONE) 5 MG immediate release tablet Take 1 tablet (5 mg total) by mouth every 8 (eight) hours as needed for severe pain. 30 tablet 0  . vitamin C (ASCORBIC ACID) 500 MG tablet Take 500 mg by mouth daily.      Past Medical History:  Diagnosis Date  . Acute left PCA stroke ([place]) 02/17/2015  . Anemia    vitamin b and vit d deficiencies  . 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 Atherosclerosis of native arteries of the extremities with gangrene ([place]) 05/08/2018  . Atrial fibrillation ([place])   . Blind    right eye  . Complication of anesthesia 2019   disoriented, trying to jump out of bed after bka  . Coronary artery calcification seen on CT scan    a. 08/2018 CT Chest: Mild cor Ca2+.  . Diabetes mellitus with complication ([place])   . Dilated aortic root ([place])    a. 04/2018 Echo: 4.1cm. Asc Ao 3.5cm; b. 04/2018 CT: Asc Ao 3.6cm. 4.1cm @ sinus of Valsalva.  . Dysrhythmia    a fib  . Gangrene of right foot ([place]) 06/02/2018  . GERD (gastroesophageal reflux disease)   . History of echocardiogram  a. 04/2018 Echo: Ef 60-65%, no rwma, midly to mod dil Ao root - 4.1cm. Asc Ao 3.5cm. Mild MR. Nl RV fxn. Nl PASP.  Marland Kitchen History of hernia repair   . History of stress test    a. 2016 MV ([name]): EF 58%, no ischemia.  . Hyperlipidemia   . Hypertension   . Leg pain   . Legally blind    right eye homonymous hemianopsa  . Memory change    d/t strokes  . Necrotic toes ([place]) 02/21/2018  . PAF (paroxysmal atrial fibrillation) ([name])    a. on Eliquis as of 2018; b. CHADS2VASc => 5 (HTN, DM, stroke x 2, vascular disease)  . Peripheral vascular disease ([place])    a. followed by Dr. Lucky Cowboy; b. s/p kissing balloon stents and right external iliac stent in 10/2017; c. 05/2018 s/p R BKA; c. 08/2019: Bilat Iliac PTA & DBA or REIA.  . Pulmonary nodules   . Stroke [facility])    a. 2016 & 2018  . Stroke [facility]) 07/11/2018    Past Surgical History:  Procedure Laterality Date  . ABDOMINAL AORTA STENT     x 2  . AMPUTATION Right 06/02/2018   Procedure: AMPUTATION BELOW KNEE;  Surgeon: [name], MD;  Location: [facility] ORS;  Service: Vascular;  Laterality: Right;  . AMPUTATION Right 10/26/2019   Procedure: RIGHT AMPUTATION ABOVE KNEE;  Surgeon: [name], MD;  Location: [facility] ORS;  Service: Vascular;  Laterality: Right;  . APPENDECTOMY    . COLONOSCOPY WITH PROPOFOL N/A 05/12/2018   Procedure: COLONOSCOPY WITH PROPOFOL;  Surgeon: [name], MD;  Location[facility] ENDOSCOPY;  Service: Gastroenterology;  Laterality: N/A;  . ESOPHAGOGASTRODUODENOSCOPY (EGD) WITH PROPOFOL N/A 05/12/2018   Procedure: ESOPHAGOGASTRODUODENOSCOPY (EGD) WITH PROPOFOL;  Surgeon: [name], MD;  Location: [facility] ENDOSCOPY;  Service: Gastroenterology;  Laterality: N/A;  . GIVENS CAPSULE STUDY N/A 07/13/2018   Procedure: GIVENS CAPSULE STUDY;  Surgeon: [name], MD;  Location: [facility] ENDOSCOPY;  Service: Gastroenterology;  Laterality: N/A;  . HERNIA REPAIR     UMBILICAL  . LEG AMPUTATION Right 10/26/2019   prior right BKA required AKA for  non-healing ulcer and wound infection  . LOWER EXTREMITY ANGIOGRAPHY Right 10/25/2017   Procedure: LOWER EXTREMITY ANGIOGRAPHY;  Surgeon: [name], MD;  Location: [place] CV LAB;  Service: Cardiovascular;  Laterality: Right;  . LOWER EXTREMITY ANGIOGRAPHY Right 01/13/2018   Procedure: LOWER EXTREMITY ANGIOGRAPHY;  Surgeon: [name], MD;  Location: [place] CV LAB;  Service: Cardiovascular;  Laterality: Right;  . LOWER EXTREMITY ANGIOGRAPHY Right 08/28/2019   Procedure: LOWER EXTREMITY ANGIOGRAPHY;  Surgeon: [name], MD;  Location: [place] CV LAB;  Service: Cardiovascular;  Laterality: Right;  . LOWER EXTREMITY INTERVENTION  10/25/2017   Procedure: LOWER EXTREMITY INTERVENTION;  Surgeon: [name], MD;  Location: [place] CV LAB;  Service: Cardiovascular;;  . SKIN SPLIT GRAFT Right 12/28/2018   Procedure: SKIN GRAFT SPLIT THICKNESS ( SYNTHETIC );  Surgeon: [name], MD;  Location: [facility] ORS;  Service: Vascular;  Laterality: Right;  . TONSILLECTOMY    . WOUND DEBRIDEMENT Right 08/08/2018   Procedure: DEBRIDEMENT WOUND WITH WOUND VAC APPLICATION;  Surgeon: [name], MD;  Location: [facility] ORS;  Service: Vascular;  Laterality: Right;    Social History Social History   Tobacco Use  . Smoking status: Former Smoker    Packs/day: 1.00    Years: 30.00    Pack years: 30.00    Types: Cigarettes  Quit date: 01/09/2018    Years since quitting: 1.8  . Smokeless tobacco: Never Used  Substance Use Topics  . Alcohol use: Yes    Alcohol/week: 0.0 standard drinks    Comment: occasionally beer   . Drug use: No    Family History Family History  Problem Relation Age of Onset  . Diabetes Father   . Hypertension Father   . Hyperlipidemia Father     Allergies  Allergen Reactions  . Sodium Pentobarbital [Pentobarbital] Shortness Of Breath  . Lipitor [Atorvastatin] Rash     REVIEW OF SYSTEMS (Negative unless checked)  Constitutional: [] Weight loss  [] Fever   [] Chills Cardiac: [] Chest pain   [] Chest pressure   [] Palpitations   [] Shortness of breath when laying flat   [] Shortness of breath at rest   [] Shortness of breath with exertion. Vascular:  [] Pain in legs with walking   [] Pain in legs at rest   [] Pain in legs when laying flat   [] Claudication   [] Pain in feet when walking  [] Pain in feet at rest  [] Pain in feet when laying flat   [] History of DVT   [] Phlebitis   [] Swelling in legs   [] Varicose veins   [] Non-healing ulcers Pulmonary:   [] Uses home oxygen   [] Productive cough   [] Hemoptysis   [] Wheeze  [] COPD   [] Asthma Neurologic:  [] Dizziness  [] Blackouts   [] Seizures   [] History of stroke   [] History of TIA  [] Aphasia   [] Temporary blindness   [] Dysphagia   [] Weakness or numbness in arms   [] Weakness or numbness in legs Musculoskeletal:  [] Arthritis   [] Joint swelling   [] Joint pain   [] Low back pain Hematologic:  [] Easy bruising  [] Easy bleeding   [] Hypercoagulable state   [] Anemic  [] Hepatitis Gastrointestinal:  [] Blood in stool   [] Vomiting blood  [] Gastroesophageal reflux/heartburn   [] Difficulty swallowing. Genitourinary:  [] Chronic kidney disease   [] Difficult urination  [] Frequent urination  [] Burning with urination   [] Blood in urine Skin:  [] Rashes   [] Ulcers   [] Wounds Psychological:  [] History of anxiety   []  History of major depression.  Physical Examination  Vitals:   11/11/19 0930 11/11/19 1000 11/11/19 1031 11/11/19 1100  BP: 126/87 121/85 132/88 129/89  Pulse: 95 (!) 107 94 95  Resp: 16 14 12  (!) 9  Temp:      TempSrc:      SpO2: 97% 96% 98% 97%  Weight:      Height:       Body mass index is 28.81 kg/m. Gen:  WD/WN, NAD Head: [place]/AT, No temporalis wasting. Prominent temp pulse not noted. Ear/Nose/Throat: Hearing grossly intact, nares w/o erythema or drainage, oropharynx w/o Erythema/Exudate Eyes: Sclera non-icteric, conjunctiva clear Neck: Trachea midline.  No JVD.  Pulmonary:  Good air movement, respirations not  labored, equal bilaterally.  Cardiac: RRR, normal S1, S2. Vascular: Palpable femoral pulses Gastrointestinal: soft, non-tender/non-distended. No guarding/reflex.  Musculoskeletal: RIGHT AKA stump, significant edema, erythema, tender to palpation, incision line necrotic with purlulent drainage Neurologic: Sensation grossly intact in extremities.  Symmetrical.  Speech is fluent. Motor exam as listed above. Psychiatric: Judgment intact, Mood & affect appropriate for pt's clinical situation. Dermatologic: No rashes or ulcers noted.  No cellulitis or open wounds. Lymph : No Cervical, Axillary, or Inguinal lymphadenopathy.      CBC Lab Results  Component Value Date   WBC 14.1 (H) 11/11/2019   HGB 8.9 (L) 11/11/2019   HCT 27.5 (L) 11/11/2019   MCV 80.6 11/11/2019  PLT 76 (L) 11/11/2019    BMET    Component Value Date/Time   NA 129 (L) 11/11/2019 0352   NA 140 10/23/2019 1403   K 4.2 11/11/2019 0352   CL 98 11/11/2019 0352   CO2 20 (L) 11/11/2019 0352   GLUCOSE 204 (H) 11/11/2019 0352   BUN 21 11/11/2019 0352   BUN 17 10/23/2019 1403   CREATININE 0.75 11/11/2019 0352   CALCIUM 7.7 (L) 11/11/2019 0352   GFRNONAA >60 11/11/2019 0352   GFRAA >60 11/11/2019 0352   Estimated Creatinine Clearance: 101.3 mL/min (by C-G formula based on SCr of 0.75 mg/dL).  COAG Lab Results  Component Value Date   INR 1.9 (H) 11/10/2019   INR 1.2 10/25/2019   INR 1.2 12/23/2018    Radiology NM Myocar Multi W/Spect W/Wall Motion / EF  Result Date: 10/13/2019  There was no ST segment deviation noted during stress.  No T wave inversion was noted during stress.  The study is normal.  This is a low risk study.  The left ventricular ejection fraction is normal (55-65%).  Atrial fibrillation on baseline EKG.    DG Chest Port 1 View  Result Date: 11/11/2019 CLINICAL DATA:  Sepsis EXAM: PORTABLE CHEST 1 VIEW COMPARISON:  06/21/2017 FINDINGS: No focal airspace disease or effusion. Borderline  cardiomegaly. No pneumothorax. IMPRESSION: No active disease. Electronically Signed   By: Donavan Foil M.D.   On: 11/11/2019 00:40   DG Femur Portable 1 View Right  Result Date: 11/11/2019 CLINICAL DATA:  Sepsis, pain EXAM: RIGHT FEMUR PORTABLE 1 VIEW COMPARISON:  MRI 07/25/2018 FINDINGS: Status post above the knee amputation. Gas locules within the stump soft tissues. Cut edge of the femur is smooth and without periostitis or gross bone destruction. No fracture identified IMPRESSION: 1. Status post above the knee amputation without definitive acute osseous abnormality 2. Small gas locules within the soft tissues distal to the residual femur, probably postoperative given recent history of AKA Electronically Signed   By: Donavan Foil M.D.   On: 11/11/2019 00:43   US Abdomen Limited RUQ  Result Date: 11/11/2019 CLINICAL DATA:  Initial evaluation for elevated LFTs. EXAM: ULTRASOUND ABDOMEN LIMITED RIGHT UPPER QUADRANT COMPARISON:  None. FINDINGS: Gallbladder: Few shadowing echogenic stones present within the gallbladder, largest of which measures 1.5 cm. Gallbladder wall measures within normal limits at 3 mm. Trace free pericholecystic fluid. No sonographic Murphy sign indicated by sonographer. Common bile duct: Diameter: 3 mm Liver: Liver demonstrates a nodular contour with coarse heterogeneous echotexture. No focal intrahepatic lesions. Portal vein is patent on color Doppler imaging with normal direction of blood flow towards the liver. Other: None. IMPRESSION: 1. Cholelithiasis with trace free pericholecystic fluid. No other sonographic features to suggest acute cholecystitis. 2. No biliary dilatation. 3. Coarse heterogeneous echotexture of the liver with nodular contour, suggesting cirrhosis. Electronically Signed   By: Jeannine Boga M.D.   On: 11/11/2019 05:50      Assessment/Plan 1. Infected RIGHT AKA stump- Will take to OR for I&D, debridement and possible revision, wound Vac  today.Extensive discussion with wife regarding current pathology, options, risks and benefits of intervention v. Conservative management. She expressed understanding and wishes to proceed with operative intervention 2. Sepsis- likely from AKA stump- will culture intraop; ABX coverage and supportive care per Medicine 3. AFIB- Cardizem gtt   Evaristo Bury, MD  11/11/2019 11:43 AM    This note was created with Dragon medical transcription system.  Any error is purely unintentional

## 2019-11-11 NOTE — ED Notes (Signed)
Informed OR RN consent had been signed.

## 2019-11-11 NOTE — Anesthesia Procedure Notes (Signed)
Procedure Name: LMA Insertion Date/Time: 11/11/2019 4:08 PM Performed by: Sherrine Maples, CRNA Pre-anesthesia Checklist: Patient identified, Patient being monitored, Timeout performed, Emergency Drugs available and Suction available Patient Re-evaluated:Patient Re-evaluated prior to induction Oxygen Delivery Method: Circle system utilized Preoxygenation: Pre-oxygenation with 100% oxygen Induction Type: IV induction LMA: LMA inserted LMA Size: 4.0 Tube type: Oral Number of attempts: 1 Placement Confirmation: positive ETCO2 and breath sounds checked- equal and bilateral Tube secured with: Tape Dental Injury: Teeth and Oropharynx as per pre-operative assessment

## 2019-11-11 NOTE — Anesthesia Postprocedure Evaluation (Signed)
Anesthesia Post Note  Patient: [name].  Procedure(s) Performed: INCISION AND DRAINAGE RIGHT ABOVE THE KNEE STUMP (Right Knee)  Patient location during evaluation: PACU Anesthesia Type: General Level of consciousness: awake and alert Pain management: pain level controlled Vital Signs Assessment: post-procedure vital signs reviewed and stable Respiratory status: spontaneous breathing, nonlabored ventilation, respiratory function stable and patient connected to nasal cannula oxygen Cardiovascular status: blood pressure returned to baseline and stable Postop Assessment: no apparent nausea or vomiting Anesthetic complications: no     Last Vitals:  Vitals:   11/11/19 1805 11/11/19 1815  BP: 138/88 139/76  Pulse: 99 (!) 102  Resp: 16 20  Temp: 36.5 C   SpO2: 97% 97%    Last Pain:  Vitals:   11/11/19 1554  TempSrc: Temporal  PainSc: 8                  Arita Miss

## 2019-11-11 NOTE — Progress Notes (Signed)
CODE SEPSIS - PHARMACY COMMUNICATION  **Broad Spectrum Antibiotics should be administered within 1 hour of Sepsis diagnosis**  Time Code Sepsis Called/Page Received: 2344  Antibiotics Ordered: vanc/ceftriaxone  Time of 1st antibiotic administration: 0019  Additional action taken by pharmacy:   If necessary, Name of Provider/Nurse Contacted:     Tobie Lords ,PharmD Clinical Pharmacist  11/11/2019  2:04 AM

## 2019-11-11 NOTE — H&P (Signed)
at [place] NAME: [name]    MR#:  [id number]  DATE OF BIRTH:  1955/08/16  DATE OF ADMISSION: 11/11/2019  PRIMARY CARE PHYSICIAN: [name], DO   REQUESTING/REFERRING PHYSICIAN: Hinda Kehr, MD CHIEF COMPLAINT:   Chief Complaint  Patient presents with  . Code Sepsis    HISTORY OF PRESENT ILLNESS:  [name]  is a 64 y.o. Caucasian male with a known history of multiple medical problems that are mentioned below, who underwent right above-knee amputation bed a couple weeks ago and presented to the emergency room with acute onset of right stump wound pain with purulent drainage as well as fever that was up to 101.4.  He has been having right leg swelling and her symptoms have been going on over the last 3 days.  No nausea or vomiting or abdominal pain.  She has been having palpitations with occasional chest pain.  Has been moaning and groaning and yelling from pain per his wife.  No reported cough or wheezing.  He has been having occasional abdominal pain without nausea or vomiting or diarrhea or melena or bright red bleeding per rectum.  No exposure to COVID-19.  Upon presentation to the emergency room blood pressure was 146/86 with a pulse of 169 and otherwise normal vital signs.  Later temperature was 100.6. EKG showed atrial fibrillation with rapid ventricular response of 175 with left axis deviation, anteroseptal old infarct.  Labs revealed elevated AST of 359 and ALT of 280 and alk phos of 190 with albumin of 2.5 total protein of 7.2 sodium of 130 and chloride of 95.  BNP was 151 and lactic acid 1.7 with procalcitonin of 1.84.  CBC showed leukocytosis of 12.8 and neutrophilia as well as anemia and thrombocytopenia with platelets of 91.  Anemia was close to last month on 10/27/2019.  The patient was given 650 mg p.o. Tylenol, IV Rocephin and vancomycin, 2 L of IV lactated Ringer, 4 mg of IV morphine sulfate and 1 L of IV normal saline.   The patient will be admitted to a stepdown unit bed for further evaluation and management. PAST MEDICAL HISTORY:   Past Medical History:  Diagnosis Date  . Acute left PCA stroke ([place]) 02/17/2015  . Anemia    vitamin b and vit d deficiencies  . 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 Atherosclerosis of native arteries of the extremities with gangrene ([place]) 05/08/2018  . Atrial fibrillation ([place])   . Blind    right eye  . Complication of anesthesia 2019   disoriented, trying to jump out of bed after bka  . Coronary artery calcification seen on CT scan    a. 08/2018 CT Chest: Mild cor Ca2+.  . Diabetes mellitus with complication ([place])   . Dilated aortic root ([place])    a. 04/2018 Echo: 4.1cm. Asc Ao 3.5cm; b. 04/2018 CT: Asc Ao 3.6cm. 4.1cm @ sinus of Valsalva.  . Dysrhythmia    a fib  . Gangrene of right foot ([place]) 06/02/2018  . GERD (gastroesophageal reflux disease)   . History of echocardiogram    a. 04/2018 Echo: Ef 60-65%, no rwma, midly to mod dil Ao root - 4.1cm. Asc Ao 3.5cm. Mild MR. Nl RV fxn. Nl PASP.  Marland Kitchen History of hernia repair   . History of stress test    a. 2016 MV ([name]): EF 58%, no ischemia.  . Hyperlipidemia   . Hypertension   .  Leg pain   . Legally blind    right eye homonymous hemianopsa  . Memory change    d/t strokes  . Necrotic toes ([place]) 02/21/2018  . PAF (paroxysmal atrial fibrillation) ([name])    a. on Eliquis as of 2018; b. CHADS2VASc => 5 (HTN, DM, stroke x 2, vascular disease)  . Peripheral vascular disease ([place])    a. followed by Dr. Lucky Cowboy; b. s/p kissing balloon stents and right external iliac stent in 10/2017; c. 05/2018 s/p R BKA; c. 08/2019: Bilat Iliac PTA & DBA or REIA.  . Pulmonary nodules   . Stroke [facility])    a. 2016 & 2018  . Stroke [facility]) 07/11/2018    PAST SURGICAL HISTORY:   Past Surgical History:  Procedure Laterality Date  . ABDOMINAL AORTA STENT     x 2  . AMPUTATION Right 06/02/2018   Procedure: AMPUTATION  BELOW KNEE;  Surgeon: [name], MD;  Location: [facility] ORS;  Service: Vascular;  Laterality: Right;  . AMPUTATION Right 10/26/2019   Procedure: RIGHT AMPUTATION ABOVE KNEE;  Surgeon: [name], MD;  Location: [facility] ORS;  Service: Vascular;  Laterality: Right;  . APPENDECTOMY    . COLONOSCOPY WITH PROPOFOL N/A 05/12/2018   Procedure: COLONOSCOPY WITH PROPOFOL;  Surgeon: [name], MD;  Location[facility] ENDOSCOPY;  Service: Gastroenterology;  Laterality: N/A;  . ESOPHAGOGASTRODUODENOSCOPY (EGD) WITH PROPOFOL N/A 05/12/2018   Procedure: ESOPHAGOGASTRODUODENOSCOPY (EGD) WITH PROPOFOL;  Surgeon: [name], MD;  Location: [facility] ENDOSCOPY;  Service: Gastroenterology;  Laterality: N/A;  . GIVENS CAPSULE STUDY N/A 07/13/2018   Procedure: GIVENS CAPSULE STUDY;  Surgeon: [name], MD;  Location: [facility] ENDOSCOPY;  Service: Gastroenterology;  Laterality: N/A;  . HERNIA REPAIR     UMBILICAL  . LEG AMPUTATION Right 10/26/2019   prior right BKA required AKA for non-healing ulcer and wound infection  . LOWER EXTREMITY ANGIOGRAPHY Right 10/25/2017   Procedure: LOWER EXTREMITY ANGIOGRAPHY;  Surgeon: [name], MD;  Location: [place] CV LAB;  Service: Cardiovascular;  Laterality: Right;  . LOWER EXTREMITY ANGIOGRAPHY Right 01/13/2018   Procedure: LOWER EXTREMITY ANGIOGRAPHY;  Surgeon: [name], MD;  Location:  [place] CV LAB;  Service: Cardiovascular;  Laterality: Right;  . LOWER EXTREMITY ANGIOGRAPHY Right 08/28/2019   Procedure: LOWER EXTREMITY ANGIOGRAPHY;  Surgeon: [name], MD;  Location: [place] CV LAB;  Service: Cardiovascular;  Laterality: Right;  . LOWER EXTREMITY INTERVENTION  10/25/2017   Procedure: LOWER EXTREMITY INTERVENTION;  Surgeon: [name], MD;  Location: [place] CV LAB;  Service: Cardiovascular;;  . SKIN SPLIT GRAFT Right 12/28/2018   Procedure: SKIN GRAFT SPLIT THICKNESS ( SYNTHETIC );  Surgeon: [name], MD;  Location: [facility] ORS;  Service: Vascular;   Laterality: Right;  . TONSILLECTOMY    . WOUND DEBRIDEMENT Right 08/08/2018   Procedure: DEBRIDEMENT WOUND WITH WOUND VAC APPLICATION;  Surgeon: [name], MD;  Location: [facility] ORS;  Service: Vascular;  Laterality: Right;    SOCIAL HISTORY:   Social History   Tobacco Use  . Smoking status: Former Smoker    Packs/day: 1.00    Years: 30.00    Pack years: 30.00    Types: Cigarettes    Quit date: 01/09/2018    Years since quitting: 1.8  . Smokeless tobacco: Never Used  Substance Use Topics  . Alcohol use: Yes    Alcohol/week: 0.0 standard drinks    Comment: occasionally beer     FAMILY HISTORY:   Family History  Problem Relation Age of Onset  . Diabetes Father   . Hypertension Father   . Hyperlipidemia Father     DRUG ALLERGIES:   Allergies  Allergen Reactions  . Sodium Pentobarbital [Pentobarbital] Shortness Of Breath  . Lipitor [Atorvastatin] Rash    REVIEW OF SYSTEMS:   ROS As per history of present illness. All pertinent systems were reviewed above. Constitutional,  HEENT, cardiovascular, respiratory, GI, GU, musculoskeletal, neuro, psychiatric, endocrine,  integumentary and hematologic systems were reviewed and are otherwise  negative/unremarkable except for positive findings mentioned above in the HPI.   MEDICATIONS AT HOME:   Prior to Admission medications   Medication Sig Start Date End Date Taking? Authorizing Provider  acetaminophen (TYLENOL) 500 MG tablet Take 1,000 mg by mouth every 6 (six) hours as needed (for pain.).   Yes [provider]  apixaban (ELIQUIS) 5 MG TABS tablet Take 1 tablet (5 mg total) by mouth 2 (two) times daily. 10/19/19  Yes Johnson, Megan P, DO  aspirin 81 MG EC tablet Take 1 tablet (81 mg total) by mouth daily. Swallow whole. 10/27/19  Yes Stegmayer, Janalyn Harder, PA-C  cholecalciferol (VITAMIN D3) 25 MCG (1000 UT) tablet Take 5,000 Units by mouth daily.    Yes [provider]  docusate sodium (COLACE) 100 MG  capsule Take 1 capsule (100 mg total) by mouth daily. Do not take if you have loose stools 12/13/18  Yes Earlie Server, MD  empagliflozin (JARDIANCE) 10 MG TABS tablet Take 5 mg by mouth daily. 10/19/19  Yes Johnson, Megan P, DO  ezetimibe-simvastatin (VYTORIN) 10-40 MG tablet TAKE 1 TABLET BY MOUTH DAILY AT 6 PM. 09/15/19  Yes Gollan, Kathlene November, MD  ferrous sulfate (FERROUSUL) 325 (65 FE) MG tablet Take 1 tablet (325 mg total) by mouth 2 (two) times daily with a meal. 03/14/19  Yes Earlie Server, MD  gabapentin (NEURONTIN) 800 MG tablet Take 1.5 tablets (1,200 mg total) by mouth 3 (three) times daily. 10/19/19  Yes Johnson, Megan P, DO  hydrochlorothiazide (MICROZIDE) 12.5 MG capsule TAKE 1 CAPSULE BY MOUTH EVERY DAY 09/29/19  Yes Johnson, Megan P, DO  lansoprazole (PREVACID) 30 MG capsule Take 30 mg by mouth daily at 12 noon.   Yes [provider]  metFORMIN (GLUCOPHAGE) 1000 MG tablet Take 1 tablet (1,000 mg total) by mouth 2 (two) times daily with a meal. 10/19/19  Yes Johnson, Megan P, DO  metoprolol tartrate (LOPRESSOR) 25 MG tablet Take 1 tablet (25 mg total) by mouth 2 (two) times daily. 10/19/19  Yes Johnson, Megan P, DO  Multiple Vitamin (MULTIVITAMIN WITH MINERALS) TABS tablet Take 1 tablet by mouth daily. 06/24/17  Yes Fritzi Mandes, MD  ONE [facility] ULTRA TEST test strip USE TO TEST BLOOD SUGAR BID 09/26/18  Yes Johnson, Megan P, DO  ONETOUCH DELICA LANCETS 27O MISC USE AS DIRECTED TO TEST BLOOD SUGAR BID 09/26/18  Yes Johnson, Megan P, DO  oxyCODONE (OXY IR/ROXICODONE) 5 MG immediate release tablet Take 1 tablet (5 mg total) by mouth every 8 (eight) hours as needed for severe pain. 11/03/19  Yes Serafina Mitchell, MD  vitamin C (ASCORBIC ACID) 500 MG tablet Take 500 mg by mouth daily.   Yes [provider]      VITAL SIGNS:  Blood pressure 123/84, pulse (!) 128, temperature (!) 100.6 F (38.1 C), temperature source Rectal, resp. rate 16, height _0  (1.753 m), weight 88.5 kg, SpO2 96  %.  PHYSICAL EXAMINATION:  Physical Exam  GENERAL:  64 y.o.-year-old Caucasian male patient lying in the bed with mild distress from pain.  EYES: Pupils equal, round, reactive to light and accommodation. No scleral icterus. Extraocular muscles intact.  HEENT: Head atraumatic, normocephalic. Oropharynx and nasopharynx clear.  NECK:  Supple, no jugular venous distention. No thyroid enlargement, no tenderness.  LUNGS: Normal breath sounds bilaterally, no wheezing, rales,rhonchi or crepitation. No use of accessory muscles of respiration.  CARDIOVASCULAR: Regular rate and rhythm, S1, S2 normal. No murmurs, rubs, or gallops.  ABDOMEN: Soft, nondistended, nontender. Bowel sounds present. No organomegaly or mass.  EXTREMITIES: No pedal edema, cyanosis, or clubbing.  NEUROLOGIC: Cranial nerves II through XII are intact. Muscle strength 5/5 in all extremities. Sensation intact. Gait not checked.  PSYCHIATRIC: The patient is alert and oriented x 3.  Normal affect and good eye contact. SKIN: Right above-knee amputation stump with intact staples however with posterior skin discoloration with erythema and purulent drainage.    LABORATORY PANEL:   CBC Recent Labs  Lab 11/10/19 2357  WBC 12.8*  HGB 9.5*  HCT 30.0*  PLT 91*   ------------------------------------------------------------------------------------------------------------------  Chemistries  Recent Labs  Lab 11/10/19 2357  NA 130*  K 4.0  CL 95*  CO2 22  GLUCOSE 145*  BUN 20  CREATININE 0.83  CALCIUM 8.2*  AST 359*  ALT 280*  ALKPHOS 190*  BILITOT 2.2*   ------------------------------------------------------------------------------------------------------------------  Cardiac Enzymes No results for input(s): TROPONINI in the last 168 hours. ------------------------------------------------------------------------------------------------------------------  RADIOLOGY:  DG Chest Port 1 View  Result Date:  11/11/2019 CLINICAL DATA:  Sepsis EXAM: PORTABLE CHEST 1 VIEW COMPARISON:  06/21/2017 FINDINGS: No focal airspace disease or effusion. Borderline cardiomegaly. No pneumothorax. IMPRESSION: No active disease. Electronically Signed   By: Donavan Foil M.D.   On: 11/11/2019 00:40   DG Femur Portable 1 View Right  Result Date: 11/11/2019 CLINICAL DATA:  Sepsis, pain EXAM: RIGHT FEMUR PORTABLE 1 VIEW COMPARISON:  MRI 07/25/2018 FINDINGS: Status post above the knee amputation. Gas locules within the stump soft tissues. Cut edge of the femur is smooth and without periostitis or gross bone destruction. No fracture identified IMPRESSION: 1. Status post above the knee amputation without definitive acute osseous abnormality 2. Small gas locules within the soft tissues distal to the residual femur, probably postoperative given recent history of AKA Electronically Signed   By: Donavan Foil M.D.   On: 11/11/2019 00:43      IMPRESSION AND PLAN:   1.  Right below knee amputation stump wound infection with subsequent severe nonpurulent cellulitis.  This is manifested by his tachycardia and leukocytosis as well as fever. -The patient will be admitted to a stepdown unit bed. -He will be placed on IV vancomycin and given recent hospitalization we will add IV Zosyn. -A vascular surgery consult will be obtained. -I notified Dr. Lucky Cowboy about the patient. -Pain management will be provided. -Wound care consult to be obtained. -The patient has left heel has adequately healed. -We will follow blood cultures as well as wound Gram stain culture and sensitivity. -Plavix and aspirin will be held off for potential surgical intervention.  2.  Sepsis, secondary to #1.  He has no severe sepsis or septic shock. -Management as above. -We will follow wound and blood cultures. -We will follow his lactic acid.  3.  Atrial fibrillation with rapid ventricular response. -The patient will be hydrated with IV normal saline.  His rate  has improved to 120s. -We will utilize IV Cardizem bolus and drip as needed. -We are  holding off Plavix.  Potential surgical intervention.  4.  Elevated LFTs. -We will check acute hepatitis panel. -The patient will be hydrated with IV normal saline. -We will follow LFTs. -We will obtain right upper quadrant ultrasound. -We will hold hepatotoxic medications.  5.  Hyponatremia and hypochloremia, likely hypovolemic. -The patient will be hydrated with IV normal saline and follow his BMP.  6.  Type 2 diabetes mellitus. -The patient will be placed on supplemental coverage with NovoLog. -We will continue Jardiance and hold off Glucophage.  7.  Dyslipidemia. -Vytorin will be held off given elevated LFTs  8.  DVT prophylaxis. -Subcutaneous Lovenox.  All the records are reviewed and case discussed with ED provider. The plan of care was discussed in details with the patient (and family). I answered all questions. The patient agreed to proceed with the above mentioned plan. Further management will depend upon hospital course.   CODE STATUS: Full code  Status is: Inpatient  Remains inpatient appropriate because:IV treatments appropriate due to intensity of illness or inability to take PO   Dispo: The patient is from: Home              Anticipated d/c is to: Home              Anticipated d/c date is: > 3 days              Patient currently is not medically stable to d/c.    TOTAL TIME TAKING CARE OF THIS PATIENT: 55 minutes.    Christel Mormon M.D on 11/11/2019 at 3:38 AM  [facility]   From 7 PM-7 AM, contact night-coverage [email or web address]  CC: Primary care physician; [name], DO   Note: This dictation was prepared with Dragon dictation along with smaller phrase technology. Any transcriptional errors that result from this process are unintentional.

## 2019-11-11 NOTE — OR Nursing (Signed)
Pt. Care assumed by [facility]. Upon arrival pt. In NAD with VSS. Pt. Denies any complaints.

## 2019-11-11 NOTE — Op Note (Signed)
    OPERATIVE NOTE   PROCEDURE: 1. Irrigation and debridement of right AKA stump- skin and soft tissue  PRE-OPERATIVE DIAGNOSIS: Nonviable tissue and infection of   Right AKA stump  POST-OPERATIVE DIAGNOSIS: Same as above  SURGEON: Jamesetta So, MD, PhD  ASSISTANT(S): none  ANESTHESIA: GETA  ESTIMATED BLOOD LOSS: 100 cc  FINDING(S): Necrosis of suture line and posterior flap; purulent drainage and medial  abscess pocket  SPECIMEN(S):  Aerobic and anaerobic cultures  INDICATIONS:   [name]. is a 64 y.o. male who presents with infection of RIGHT AKA stump.  DESCRIPTION: After obtaining full informed written consent, the patient was brought back to the operating room and placed supine upon the operating table.  The patient received IV antibiotics prior to induction.  After obtaining adequate anesthesia, the patient was prepped and draped in the standard fashion for: right AKA stump I&D.  The wound was then opened and excisional debridement was performed to the skin and soft tissue to remove all clearly non-viable tissue.  The tissue was taken back to bleeding tissue that appeared viable.  There was oozing from all surfaces as the patient is on Eliquis. Adequate Hemostasis was obtained. The debridement was performed with pulse lavage, curette and nonviable tissue was excised with #15 blade and electrocautery and encompassed an area of approximately 15 cm2.  The wound was irrigated copiously with Pulse lavage with Bacitracin.  After all clearly non-viable tissue was removed, a wound vac was placed to suction with good seal. The patient was then awakened from anesthesia and taken to the recovery room in stable condition having tolerated the procedure well.  COMPLICATIONS: none  CONDITION: stable  [name]  11/11/2019, 5:28 PM   This note was created with Dragon Medical transcription system. Any errors in dictation are purely unintentional.

## 2019-11-12 ENCOUNTER — Inpatient Hospital Stay: Payer: Medicare HMO

## 2019-11-12 ENCOUNTER — Inpatient Hospital Stay (HOSPITAL_COMMUNITY)
Admit: 2019-11-12 | Discharge: 2019-11-12 | Disposition: A | Discharge status: Home / Self Care | Payer: Medicare HMO | Attending: Family Medicine | Admitting: Family Medicine

## 2019-11-12 DIAGNOSIS — I4891 Unspecified atrial fibrillation: Secondary | ICD-10-CM

## 2019-11-12 LAB — GLUCOSE, CAPILLARY
Glucose-Capillary: 301 mg/dL — ABNORMAL HIGH (ref 70–99)
Glucose-Capillary: 237 mg/dL — ABNORMAL HIGH (ref 70–99)
Glucose-Capillary: 180 mg/dL — ABNORMAL HIGH (ref 70–99)
Glucose-Capillary: 198 mg/dL — ABNORMAL HIGH (ref 70–99)

## 2019-11-12 LAB — ECHOCARDIOGRAM COMPLETE
Height: 69 in
Weight: 3121.71 oz

## 2019-11-12 LAB — FIBRIN DERIVATIVES D-DIMER (ARMC ONLY): Fibrin derivatives D-dimer (ARMC): 4988.36 ng/mL (FEU) — ABNORMAL HIGH (ref 0.00–499.00)

## 2019-11-12 LAB — CBC
HCT: 27.2 % — ABNORMAL LOW (ref 39.0–52.0)
Hemoglobin: 8.5 g/dL — ABNORMAL LOW (ref 13.0–17.0)
MCH: 25.4 pg — ABNORMAL LOW (ref 26.0–34.0)
MCHC: 31.3 g/dL (ref 30.0–36.0)
MCV: 81.2 fL (ref 80.0–100.0)
Platelets: 144 10*3/uL — ABNORMAL LOW (ref 150–400)
RBC: 3.35 MIL/uL — ABNORMAL LOW (ref 4.22–5.81)
RDW: 17.2 % — ABNORMAL HIGH (ref 11.5–15.5)
WBC: 14.1 10*3/uL — ABNORMAL HIGH (ref 4.0–10.5)
nRBC: 1.1 % — ABNORMAL HIGH (ref 0.0–0.2)

## 2019-11-12 LAB — BASIC METABOLIC PANEL
Anion gap: 11 (ref 5–15)
BUN: 35 mg/dL — ABNORMAL HIGH (ref 8–23)
CO2: 22 mmol/L (ref 22–32)
Calcium: 7.8 mg/dL — ABNORMAL LOW (ref 8.9–10.3)
Chloride: 96 mmol/L — ABNORMAL LOW (ref 98–111)
Creatinine, Ser: 0.84 mg/dL (ref 0.61–1.24)
GFR calc Af Amer: >60 mL/min (ref >60)
GFR calc non Af Amer: >60 mL/min (ref >60)
Glucose, Bld: 309 mg/dL — ABNORMAL HIGH (ref 70–99)
Potassium: 4.1 mmol/L (ref 3.5–5.1)
Sodium: 129 mmol/L — ABNORMAL LOW (ref 135–145)

## 2019-11-12 LAB — URINE CULTURE

## 2019-11-12 LAB — LACTATE DEHYDROGENASE: LDH: 252 U/L — ABNORMAL HIGH (ref 98–192)

## 2019-11-12 LAB — FIBRINOGEN: Fibrinogen: >750 mg/dL — ABNORMAL HIGH (ref 210–475)

## 2019-11-12 LAB — APTT: aPTT: 72 seconds — ABNORMAL HIGH (ref 24–36)

## 2019-11-12 IMAGING — CT CT ANGIO CHEST
2 of 6 series · 18 of 46 positions shown · IV contrast (APPLIED)
Comparison: Chest CT 08/12/2018

CLINICAL DATA: Elevated D-dimer. Recent debridement above knee
amputation stump.

EXAM:
CT ANGIOGRAPHY CHEST WITH CONTRAST
TECHNIQUE: Multidetector CT imaging of the chest was performed using the
standard protocol during bolus administration of intravenous
contrast. Multiplanar CT image reconstructions and MIPs were
obtained to evaluate the vascular anatomy.
CONTRAST:  75mL OMNIPAQUE IOHEXOL 350 MG/ML SOLN

[Series 5: thins · axial · 0.74mm/px · z∈[-561,-230]mm · 15 of 363 slices shown]
[im 16/363  lung]
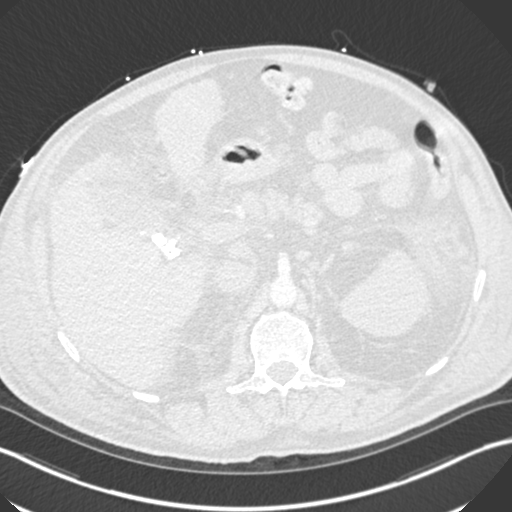
[im 48/363  soft-tissue]
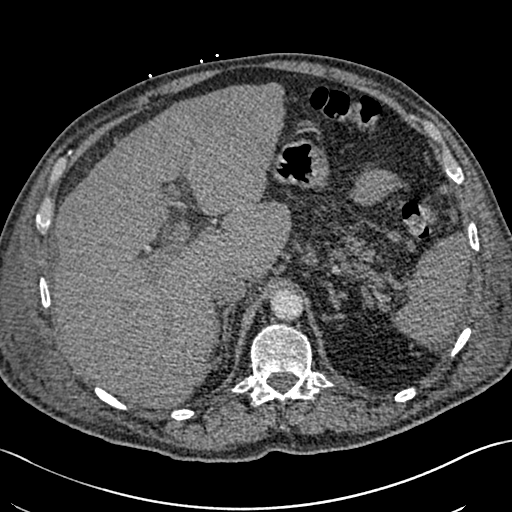
[im 63/363  lung]
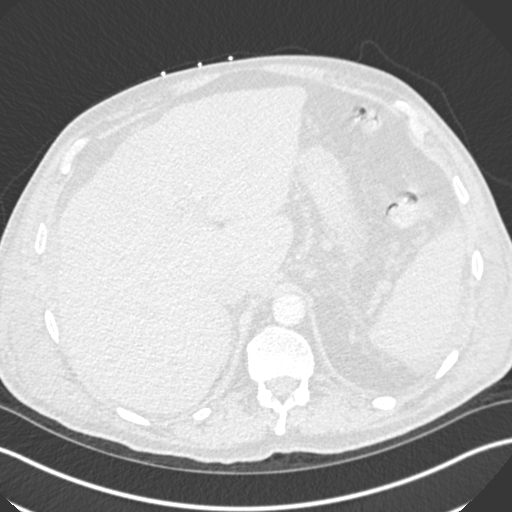
[im 95/363  soft-tissue]
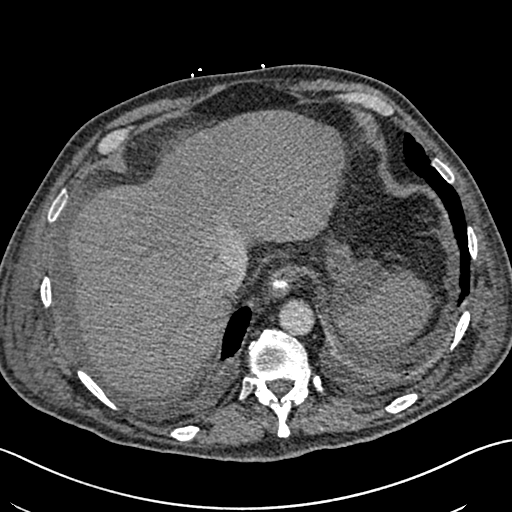
[im 111/363  lung]
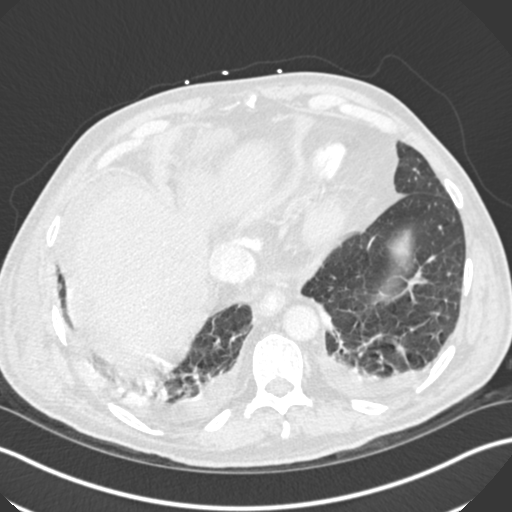
[im 142/363  soft-tissue]
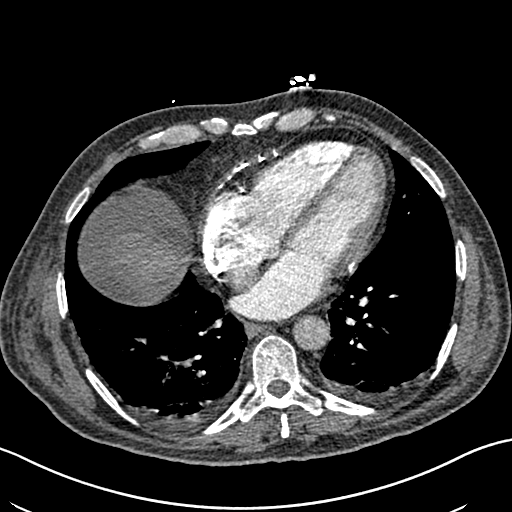
[im 158/363  lung]
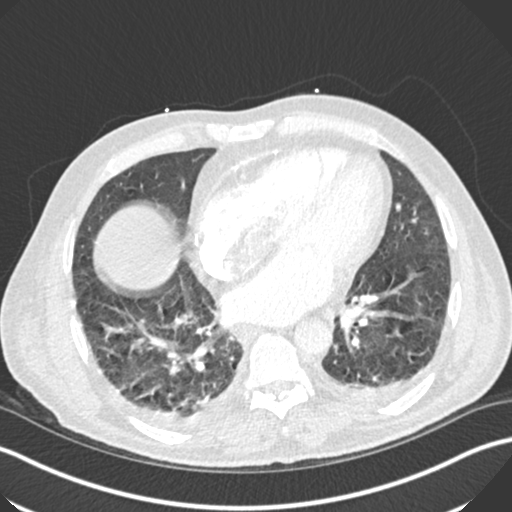
[im 189/363  soft-tissue]
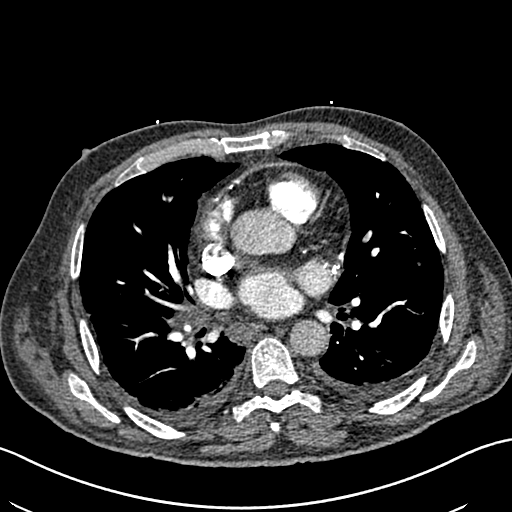
[im 205/363  lung]
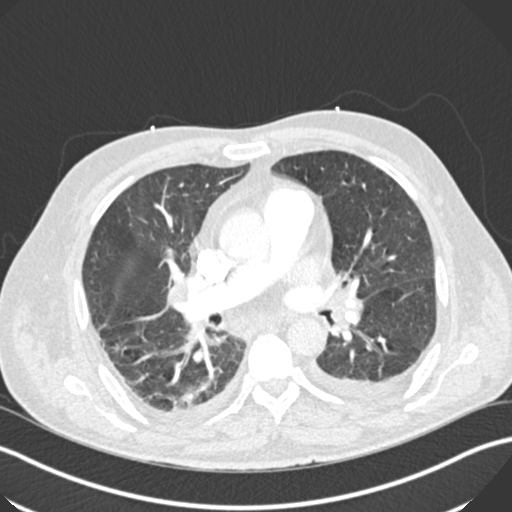
[im 221/363  soft-tissue]
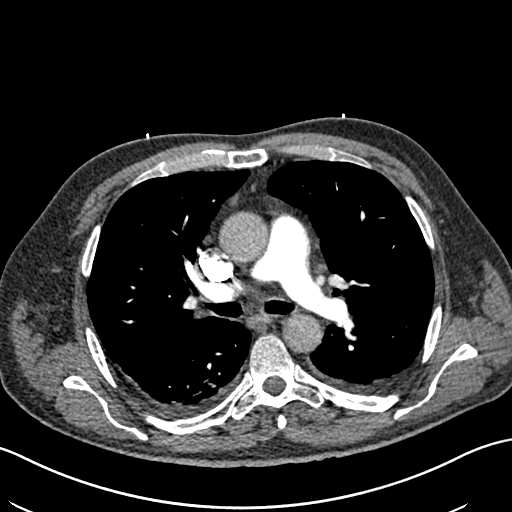
[im 252/363  lung]
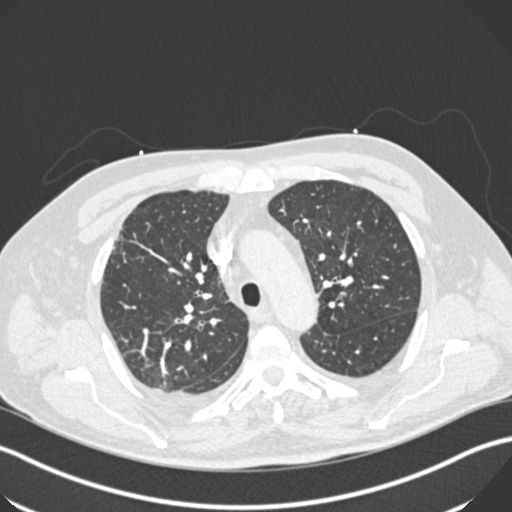
[im 268/363  soft-tissue]
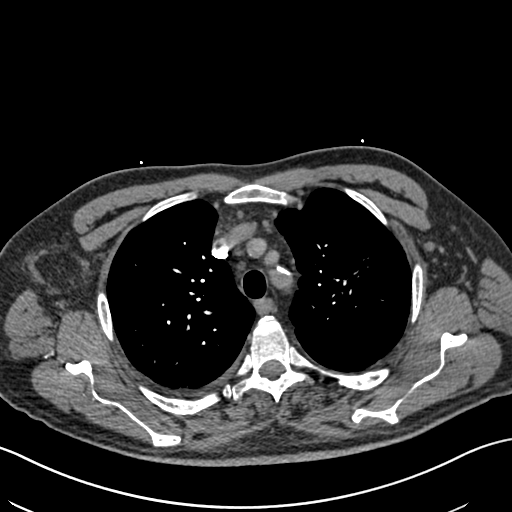
[im 300/363  lung]
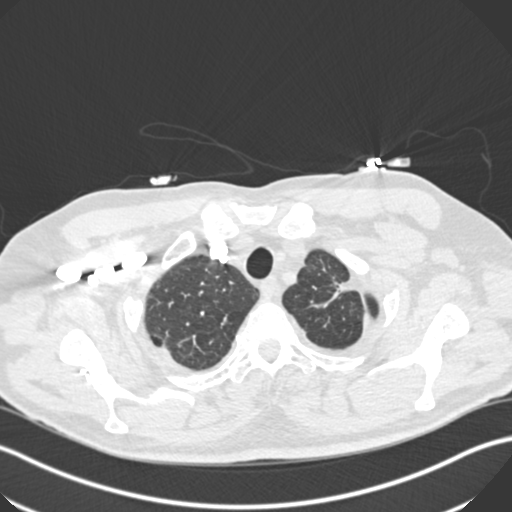
[im 315/363  soft-tissue]
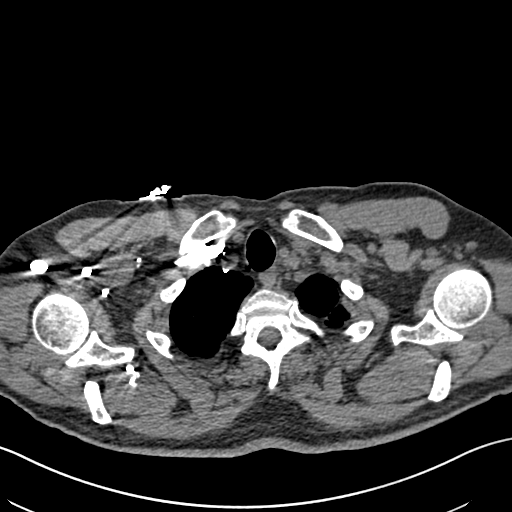
[im 347/363  lung]
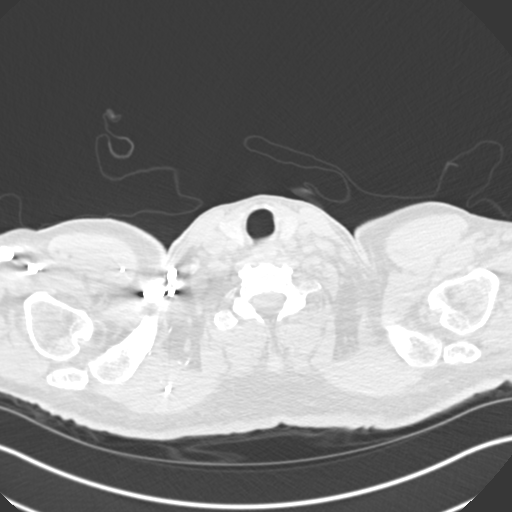

[Series 7: coronal mpr · coronal · 0.73mm/px · 3 of 108 slices shown]
[im 27/108  soft-tissue]
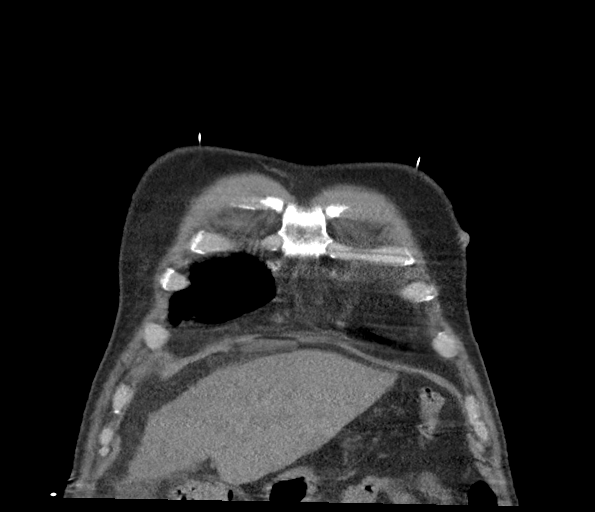
[im 54/108  soft-tissue]
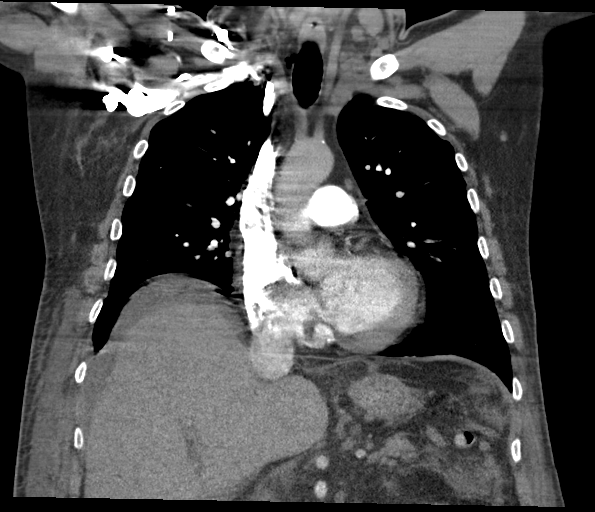
[im 81/108  soft-tissue]
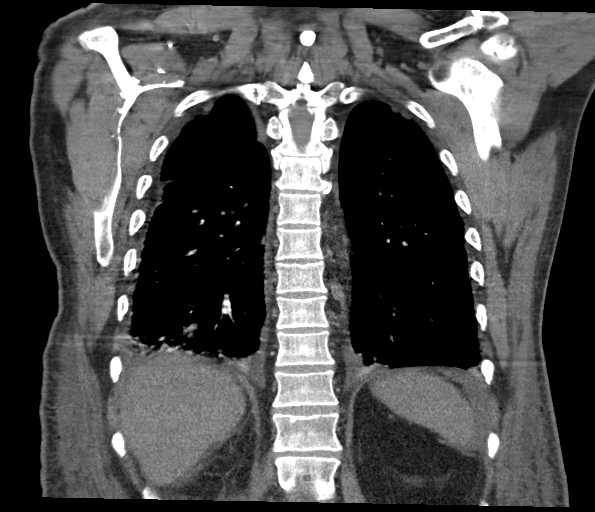

[18 of 46 positions shown; findings below may reference images not displayed]

FINDINGS: Cardiovascular: No filling defects within the pulmonary arteries to
suggest acute pulmonary embolism. Coronary artery calcification and
aortic atherosclerotic calcification.

Mediastinum/Nodes: No axillary supraclavicular adenopathy. Mildly
enlarged hilar nodes measure up to 10 mm short axis. Subcarinal node
and RIGHT lower paratracheal nodes measures 11 mm. These nodes are
slightly increased from comparison CT. No pericardial effusion.
Calcified pericardium.

Lungs/Pleura: Bilateral pleural effusions. No focal consolidation.
Linear scarring in the LEFT upper lobe.

Upper Abdomen: Gallstones present. Normal adrenal glands. Liver has
a nodular contour. Small ascites surrounds the liver.

Ill-defined hypodensities in the dome of the liver (image 81/4.

Musculoskeletal: No aggressive osseous lesion.

Review of the MIP images confirms the above findings.
IMPRESSION: 1. No evidence acute pulmonary embolism.
2. Mild mediastinal hilar adenopathy is favored reactive.
3. Thin calcifications of the pericardium.
4. Nodular liver with ascites consistent with cirrhosis.
5. Cholelithiasis.
6. Ill-defined hypodensity in the dome liver. Consider abdominal
pelvis CT with contrast to exclude hepatic lesion in patient with
cirrhosis.

## 2019-11-12 MED ORDER — IOHEXOL 350 MG/ML SOLN
75.0000 mL | Freq: Once | INTRAVENOUS | Status: AC | PRN
  Administered 2019-11-12: 75 mL via INTRAVENOUS

## 2019-11-12 MED ORDER — SODIUM CHLORIDE 0.9 % IV SOLN: INTRAVENOUS | Status: DC

## 2019-11-12 MED ORDER — APIXABAN 5 MG PO TABS
5.0000 mg | ORAL_TABLET | Freq: Two times a day (BID) | ORAL | Status: DC
  Administered 2019-11-12 – 2019-11-13 (×3): 5 mg via ORAL
  Filled 2019-11-12 (×3): qty 1

## 2019-11-12 MED ORDER — METOPROLOL TARTRATE 25 MG PO TABS: 25.0000 mg | ORAL_TABLET | Freq: Two times a day (BID) | ORAL | Status: DC

## 2019-11-12 MED ORDER — OXYCODONE HCL 5 MG PO TABS
10.0000 mg | ORAL_TABLET | Freq: Three times a day (TID) | ORAL | Status: DC | PRN
  Administered 2019-11-13 – 2019-11-18 (×14): 10 mg via ORAL
  Filled 2019-11-12 (×14): qty 2

## 2019-11-12 MED ORDER — HYDROMORPHONE HCL 1 MG/ML IJ SOLN
1.0000 mg | INTRAMUSCULAR | Status: DC | PRN
  Administered 2019-11-12 – 2019-11-21 (×26): 1 mg via INTRAVENOUS
  Filled 2019-11-12 (×28): qty 1

## 2019-11-12 MED ORDER — METOPROLOL TARTRATE 5 MG/5ML IV SOLN
5.0000 mg | Freq: Four times a day (QID) | INTRAVENOUS | Status: DC | PRN
  Administered 2019-11-12 – 2019-11-14 (×2): 5 mg via INTRAVENOUS
  Filled 2019-11-12 (×2): qty 5

## 2019-11-12 NOTE — Progress Notes (Signed)
1 Day Post-Op   Subjective/Chief Complaint: States he is just OK. Pain in Right stump.   Objective: Vital signs in last 24 hours: Temp:  [96.8 F (36 C)-98.4 F (36.9 C)] 97.8 F (36.6 C) (04/11 0815) Pulse Rate:  [59-107] 59 (04/11 0815) Resp:  [9-20] 16 (04/11 0815) BP: (104-141)/(69-99) 104/71 (04/11 0815) SpO2:  [94 %-100 %] 98 % (04/11 0815)    Intake/Output from previous day: 04/10 0701 - 04/11 0700 In: 1068.8 [P.O.:50; I.V.:881.4; IV Piggyback:137.5] Out: 650 [Urine:500; Drains:50; Blood:100] Intake/Output this shift: No intake/output data recorded.  General appearance: alert and mild distress Resp: clear to auscultation bilaterally Cardio: irregularly irregular rhythm Extremities: RIGHT stump- thigh soft, decreased edema, Wound VAC in place with good seal, minimal serosang drainage in canister  Lab Results:  Recent Labs    11/11/19 0352 11/12/19 0855  WBC 14.1* 14.1*  HGB 8.9* 8.5*  HCT 27.5* 27.2*  PLT 76* 144*   BMET Recent Labs    11/10/19 2357 11/11/19 0352  NA 130* 129*  K 4.0 4.2  CL 95* 98  CO2 22 20*  GLUCOSE 145* 204*  BUN 20 21  CREATININE 0.83 0.75  CALCIUM 8.2* 7.7*   PT/INR Recent Labs    11/10/19 2357  LABPROT 21.4*  INR 1.9*   ABG No results for input(s): PHART, HCO3 in the last 72 hours.  Invalid input(s): PCO2, PO2  Studies/Results: DG Chest Port 1 View  Result Date: 11/11/2019 CLINICAL DATA:  Sepsis EXAM: PORTABLE CHEST 1 VIEW COMPARISON:  06/21/2017 FINDINGS: No focal airspace disease or effusion. Borderline cardiomegaly. No pneumothorax. IMPRESSION: No active disease. Electronically Signed   By: Donavan Foil M.D.   On: 11/11/2019 00:40   DG Femur Portable 1 View Right  Result Date: 11/11/2019 CLINICAL DATA:  Sepsis, pain EXAM: RIGHT FEMUR PORTABLE 1 VIEW COMPARISON:  MRI 07/25/2018 FINDINGS: Status post above the knee amputation. Gas locules within the stump soft tissues. Cut edge of the femur is smooth and without  periostitis or gross bone destruction. No fracture identified IMPRESSION: 1. Status post above the knee amputation without definitive acute osseous abnormality 2. Small gas locules within the soft tissues distal to the residual femur, probably postoperative given recent history of AKA Electronically Signed   By: Donavan Foil M.D.   On: 11/11/2019 00:43   US Abdomen Limited RUQ  Result Date: 11/11/2019 CLINICAL DATA:  Initial evaluation for elevated LFTs. EXAM: ULTRASOUND ABDOMEN LIMITED RIGHT UPPER QUADRANT COMPARISON:  None. FINDINGS: Gallbladder: Few shadowing echogenic stones present within the gallbladder, largest of which measures 1.5 cm. Gallbladder wall measures within normal limits at 3 mm. Trace free pericholecystic fluid. No sonographic Murphy sign indicated by sonographer. Common bile duct: Diameter: 3 mm Liver: Liver demonstrates a nodular contour with coarse heterogeneous echotexture. No focal intrahepatic lesions. Portal vein is patent on color Doppler imaging with normal direction of blood flow towards the liver. Other: None. IMPRESSION: 1. Cholelithiasis with trace free pericholecystic fluid. No other sonographic features to suggest acute cholecystitis. 2. No biliary dilatation. 3. Coarse heterogeneous echotexture of the liver with nodular contour, suggesting cirrhosis. Electronically Signed   By: Jeannine Boga M.D.   On: 11/11/2019 05:50    Anti-infectives: Anti-infectives (From admission, onward)   Start     Dose/Rate Route Frequency Ordered Stop   11/11/19 1400  vancomycin (VANCOREADY) IVPB 1250 mg/250 mL     1,250 mg 166.7 mL/hr over 90 Minutes Intravenous Every 12 hours 11/11/19 0158     11/11/19  0600  piperacillin-tazobactam (ZOSYN) IVPB 3.375 g     3.375 g 12.5 mL/hr over 240 Minutes Intravenous Every 8 hours 11/11/19 0322     11/11/19 0215  vancomycin (VANCOCIN) IVPB 1000 mg/200 mL premix  Status:  Discontinued     1,000 mg 200 mL/hr over 60 Minutes Intravenous  Once  11/11/19 0214 11/11/19 0321   11/11/19 0215  piperacillin-tazobactam (ZOSYN) IVPB 3.375 g  Status:  Discontinued     3.375 g 100 mL/hr over 30 Minutes Intravenous Every 6 hours 11/11/19 0214 11/11/19 0321   11/11/19 0015  vancomycin (VANCOCIN) IVPB 1000 mg/200 mL premix     1,000 mg 200 mL/hr over 60 Minutes Intravenous  Once 11/11/19 0010 11/11/19 0252   11/10/19 2345  vancomycin (VANCOCIN) IVPB 1000 mg/200 mL premix     1,000 mg 200 mL/hr over 60 Minutes Intravenous  Once 11/10/19 2342 11/11/19 0127   11/10/19 2345  cefTRIAXone (ROCEPHIN) 2 g in sodium chloride 0.9 % 100 mL IVPB     2 g 200 mL/hr over 30 Minutes Intravenous  Once 11/10/19 2342 11/11/19 0131      Assessment/Plan: s/p Procedure(s): INCISION AND DRAINAGE RIGHT ABOVE THE KNEE STUMP (Right) Follow wound Cultures.   Will Change wound VAC tomorrow to assess wound. Wound Care Consult Ok to resume Eliquis. Continue supportive care per Medicine  LOS: 1 day    Jamesetta So A 11/12/2019

## 2019-11-12 NOTE — Progress Notes (Signed)
PROGRESS NOTE    Glenn B [name] Jr.  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 Continue on IV vanco & zosyn. Blood cxs NGTD. Right leg culture pending. Urine cx growing multiple species, likely contaminate. S/p I&D of right AKA stump, skin & soft tissue 11/11/19 as per vascular surg. Continue w/ wound care. Eliquis restarted as per vasc surg  Sepsis: secondary to above. Management as stated above   Atrial fibrillation: possibly PAF w/ RVR. D/c IV cadizem drip and restart home dose of metoprolol. Hold metoprolol for HR<65. Continue to hold plavix   Thrombocytopenia:  trending up today. Etiology unclear, medication ADR (aspirin, eliquis) vs infection  vs DIC vs cirrhosis. Pathologist smear pending.  PTT, fibrinogen, d-dimer, PT, LDH all elevated, haptoglobin pending. Will hold home dose of aspirin.  Likely cirrhosis: w/ ascites & likely liver lesion as per CTA. Rads recs CT abd w/ contrast but will hold off until tomorrow as pt already got a contrast load w/ CTA. Will order tomorrow if Cr is stable.  HBV surface antigen positive, likely acute hepatitis. Hepatitis B profile ordered    Normocytic anemia: no need for a transfusion at this time. Possibly secondary to cirrhosis. Will continue to monitor   Leukocytosis: likely secondary to infection. Continue on IV abxs   Transaminitis: hepatitis panel shows positive HBV surface antigen. Likely cirrhosis on CTA. HBV profile ordered. RUQ US shows cholelithiasis w/ trace free pericholecystic fluid, no other evidence to acute cholecystitis   Metabolic acidosis: resolved  Hyponatremia: possibly secondary to cirrhosis. Continue on IVFs   DM2: will continue on SSI w/ accuchecks. Will continue to hold jardiance and metformin   Dyslipidemia: continue to  hold vytorin secondary to transaminitis    DVT prophylaxis: eliquis Code Status: full  Family Communication: discussed pt's care w/ pt's wife who was at bedside Disposition Plan:  Depends on PT/OT   Consultants:   Vascular surgery    Procedures:    Antimicrobials: zosyn, vanco   Subjective: Pt c/o pain in abd & R AKA stump.   Objective: Vitals:   11/11/19 2015 11/12/19 0012 11/12/19 0437 11/12/19 0815  BP: 128/70 110/70 119/69 104/71  Pulse: 94 65 74 (!) 59  Resp: 16 14 15 16   Temp: 97.8 F (36.6 C) 98.4 F (36.9 C) 98.2 F (36.8 C) 97.8 F (36.6 C)  TempSrc: Oral Oral Oral   SpO2: 99% 96% 94% 98%  Weight:      Height:        Intake/Output Summary (Last 24 hours) at 11/12/2019 1422 Last data filed at 11/12/2019 0645 Gross per 24 hour  Intake 1034.8 ml  Output 150 ml  Net 884.8 ml   Filed Weights   11/10/19 2350  Weight: 88.5 kg    Examination:  General exam: Appears calm and comfortable  Respiratory system: decreased breath sounds b/l. Cardiovascular system: irregularly irregular. No rubs, gallops or clicks.  Gastrointestinal system: Abdomen is nondistended, soft and tenderness to palpation. Normal bowel sounds heard. Central nervous system: Alert and oriented to self only  Psychiatry: Judgement and insight appear abnormal. Flat mood and affect.     Data Reviewed: I have personally reviewed following labs and imaging studies  CBC: Recent Labs  Lab 11/10/19 2357 11/11/19 0352 11/12/19 0855  WBC 12.8* 14.1* 14.1*  NEUTROABS 10.1*  --   --  HGB 9.5* 8.9* 8.5*  HCT 30.0* 27.5* 27.2*  MCV 80.4 80.6 81.2  PLT 91* 76* 123456*   Basic Metabolic Panel: Recent Labs  Lab 11/10/19 2357 11/11/19 0352 11/12/19 0855  NA 130* 129* 129*  K 4.0 4.2 4.1  CL 95* 98 96*  CO2 22 20* 22  GLUCOSE 145* 204* 309*  BUN 20 21 35*  CREATININE 0.83 0.75 0.84  CALCIUM 8.2* 7.7* 7.8*   GFR: Estimated Creatinine Clearance: 96.5 mL/min (by C-G formula based on  SCr of 0.84 mg/dL). Liver Function Tests: Recent Labs  Lab 11/10/19 2357  AST 359*  ALT 280*  ALKPHOS 190*  BILITOT 2.2*  PROT 7.2  ALBUMIN 2.5*   Recent Labs  Lab 11/10/19 2357  LIPASE 16   No results for input(s): AMMONIA in the last 168 hours. Coagulation Profile: Recent Labs  Lab 11/10/19 2357  INR 1.9*   Cardiac Enzymes: No results for input(s): CKTOTAL, CKMB, CKMBINDEX, TROPONINI in the last 168 hours. BNP (last 3 results) No results for input(s): PROBNP in the last 8760 hours. HbA1C: No results for input(s): HGBA1C in the last 72 hours. CBG: Recent Labs  Lab 11/11/19 1436 11/11/19 1736 11/11/19 2158 11/12/19 0817 11/12/19 1139  GLUCAP 239* 219* 279* 301* 237*   Lipid Profile: No results for input(s): CHOL, HDL, LDLCALC, TRIG, CHOLHDL, LDLDIRECT in the last 72 hours. Thyroid Function Tests: No results for input(s): TSH, T4TOTAL, FREET4, T3FREE, THYROIDAB in the last 72 hours. Anemia Panel: No results for input(s): VITAMINB12, FOLATE, FERRITIN, TIBC, IRON, RETICCTPCT in the last 72 hours. Sepsis Labs: Recent Labs  Lab 11/10/19 2357 11/11/19 0352  PROCALCITON 1.84  --   LATICACIDVEN 1.7 1.5    Recent Results (from the past 240 hour(s))  Blood Culture (routine x 2)     Status: None (Preliminary result)   Collection Time: 11/10/19 11:57 PM   Specimen: BLOOD  Result Value Ref Range Status   Specimen Description BLOOD BLOOD LEFT HAND  Final   Special Requests   Final    BOTTLES DRAWN AEROBIC AND ANAEROBIC Blood Culture results may not be optimal due to an excessive volume of blood received in culture bottles   Culture   Final    NO GROWTH 1 DAY Performed at [facility], [address], [place], [place] [address]    Report Status PENDING  Incomplete  Blood Culture (routine x 2)     Status: None (Preliminary result)   Collection Time: 11/10/19 11:57 PM   Specimen: BLOOD  Result Value Ref Range Status   Specimen Description BLOOD BLOOD  RIGHT HAND  Final   Special Requests   Final    BOTTLES DRAWN AEROBIC AND ANAEROBIC Blood Culture results may not be optimal due to an inadequate volume of blood received in culture bottles   Culture   Final    NO GROWTH 1 DAY Performed at [facility], [address] [place], [place] [address]    Report Status PENDING  Incomplete  Urine culture     Status: Abnormal   Collection Time: 11/10/19 11:57 PM   Specimen: In/Out Cath Urine  Result Value Ref Range Status   Specimen Description   Final    IN/OUT CATH URINE Performed at [facility], [address] [place], [place] [address]    Special Requests   Final    NONE Performed at [facility], [address] [place], [place] [address]    Culture Industry  RECOLLECTION (A)  Final   Report Status 11/12/2019 FINAL  Final  Aerobic/Anaerobic Culture (surgical/deep wound)     Status: None (Preliminary result)   Collection Time: 11/10/19 11:57 PM   Specimen: Leg; Wound  Result Value Ref Range Status   Specimen Description   Final    LEG RIGHT Performed at [facility], [address], [place], [place] [address]    Special Requests   Final    Normal Performed at [facility], [place] [place], [place] [address]    Gram Stain   Final    NO WBC SEEN MODERATE GRAM POSITIVE RODS Performed at [facility], [place] [address], [place], [place] [address]    Culture PENDING  Incomplete   Report Status PENDING  Incomplete  Respiratory Panel by RT PCR (Flu A&B, Covid) - Nasopharyngeal Swab     Status: None   Collection Time: 11/11/19  1:46 AM   Specimen: Nasopharyngeal Swab  Result Value Ref Range Status   SARS Coronavirus 2 by RT PCR NEGATIVE NEGATIVE Final    Comment: (NOTE) SARS-CoV-2 target nucleic acids are NOT DETECTED. The SARS-CoV-2 RNA is generally detectable in upper respiratoy specimens during the acute phase of infection. The  lowest concentration of SARS-CoV-2 viral copies this assay can detect is 131 copies/mL. A negative result does not preclude SARS-Cov-2 infection and should not be used as the sole basis for treatment or other patient management decisions. A negative result may occur with  improper specimen collection/handling, submission of specimen other than nasopharyngeal swab, presence of viral mutation(s) within the areas targeted by this assay, and inadequate number of viral copies (<131 copies/mL). A negative result must be combined with clinical observations, patient history, and epidemiological information. The expected result is Negative. Fact Sheet for Patients:  PinkCheek.be Fact Sheet for Healthcare Providers:  GravelBags.it This test is not yet ap proved or cleared by the [place] FDA and  has been authorized for detection and/or diagnosis of SARS-CoV-2 by FDA under an Emergency Use Authorization (EUA). This EUA will remain  in effect (meaning this test can be used) for the duration of the COVID-19 declaration under Section 564(b)(1) of the Act, 21 U.S.C. section 360bbb-3(b)(1), unless the authorization is terminated or revoked sooner.    Influenza A by PCR NEGATIVE NEGATIVE Final   Influenza B by PCR NEGATIVE NEGATIVE Final    Comment: (NOTE) The Xpert Xpress SARS-CoV-2/FLU/RSV assay is intended as an aid in  the diagnosis of influenza from Nasopharyngeal swab specimens and  should not be used as a sole basis for treatment. Nasal washings and  aspirates are unacceptable for Xpert Xpress SARS-CoV-2/FLU/RSV  testing. Fact Sheet for Patients: PinkCheek.be Fact Sheet for Healthcare Providers: GravelBags.it This test is not yet approved or cleared by the [place] FDA and  has been authorized for detection and/or diagnosis of SARS-CoV-2 by  FDA under an Emergency  Use Authorization (EUA). This EUA will remain  in effect (meaning this test can be used) for the duration of the  Covid-19 declaration under Section 564(b)(1) of the Act, 21  U.S.C. section 360bbb-3(b)(1), unless the authorization is  terminated or revoked. Performed at [facility], [address] [place], [place] [address]          Radiology Studies: CT ANGIO CHEST PE W OR WO CONTRAST  Result Date: 11/12/2019 CLINICAL DATA:  Elevated D-dimer. Recent debridement above knee amputation stump. EXAM: CT ANGIOGRAPHY CHEST WITH CONTRAST TECHNIQUE: Multidetector CT imaging  of the chest was performed using the standard protocol during bolus administration of intravenous contrast. Multiplanar CT image reconstructions and MIPs were obtained to evaluate the vascular anatomy. CONTRAST:  58mL OMNIPAQUE IOHEXOL 350 MG/ML SOLN COMPARISON:  Chest CT 08/12/2018 FINDINGS: Cardiovascular: No filling defects within the pulmonary arteries to suggest acute pulmonary embolism. Coronary artery calcification and aortic atherosclerotic calcification. Mediastinum/Nodes: No axillary supraclavicular adenopathy. Mildly enlarged hilar nodes measure up to 10 mm short axis. Subcarinal node and RIGHT lower paratracheal nodes measures 11 mm. These nodes are slightly increased from comparison CT. No pericardial effusion. Calcified pericardium. Lungs/Pleura: Bilateral pleural effusions. No focal consolidation. Linear scarring in the LEFT upper lobe. Upper Abdomen: Gallstones present. Normal adrenal glands. Liver has a nodular contour. Small ascites surrounds the liver. Ill-defined hypodensities in the dome of the liver (image 81/4. Musculoskeletal: No aggressive osseous lesion. Review of the MIP images confirms the above findings. IMPRESSION: 1. No evidence acute pulmonary embolism. 2. Mild mediastinal hilar adenopathy is favored reactive. 3. Thin calcifications of the pericardium. 4. Nodular liver with ascites consistent  with cirrhosis. 5. Cholelithiasis. 6. Ill-defined hypodensity in the dome liver. Consider abdominal pelvis CT with contrast to exclude hepatic lesion in patient with cirrhosis. Electronically Signed   By: Suzy Bouchard M.D.   On: 11/12/2019 10:08   DG Chest Port 1 View  Result Date: 11/11/2019 CLINICAL DATA:  Sepsis EXAM: PORTABLE CHEST 1 VIEW COMPARISON:  06/21/2017 FINDINGS: No focal airspace disease or effusion. Borderline cardiomegaly. No pneumothorax. IMPRESSION: No active disease. Electronically Signed   By: Donavan Foil M.D.   On: 11/11/2019 00:40   ECHOCARDIOGRAM COMPLETE  Result Date: 11/12/2019    ECHOCARDIOGRAM REPORT   Patient Name:   [name]. Date of Exam: 11/12/2019 Medical Rec #:  [id number]           Height:       69.0 in Accession #:    [id number]          Weight:       195.1 lb Date of Birth:  1955/10/14           BSA:          2.044 m Patient Age:    27 years            BP:           119/67 mmHg Patient Gender: M                   HR:           80 bpm. Exam Location:  [facility] Procedure: 2D Echo Indications:     ATRIAL FIBRILLATION 437.31/ I48.91  History:         Patient has prior history of Echocardiogram examinations. Risk                  Factors:Dyslipidemia, Current Smoker, Diabetes and                  Hypertension.  Sonographer:     Avanell Shackleton Referring Phys:  [id number] [place] Diagnosing Phys: Kate Sable MD IMPRESSIONS  1. Left ventricular ejection fraction, by estimation, is 50 to 55%. The left ventricle has low normal function. The left ventricle has no regional wall motion abnormalities. Left ventricular diastolic parameters are indeterminate.  2. Right ventricular systolic function is normal. The right ventricular size is normal.  3. Left atrial size was mildly dilated.  4. The mitral valve is normal  in structure. Trivial mitral valve regurgitation.  5. The aortic valve is normal in structure. Aortic valve regurgitation is not visualized.  6. The inferior  vena cava is dilated in size with <50% respiratory variability, suggesting right atrial pressure of 15 mmHg. FINDINGS  Left Ventricle: Left ventricular ejection fraction, by estimation, is 50 to 55%. The left ventricle has low normal function. The left ventricle has no regional wall motion abnormalities. The left ventricular internal cavity size was normal in size. There is no left ventricular hypertrophy. Left ventricular diastolic parameters are indeterminate. Right Ventricle: The right ventricular size is normal. Right vetricular wall thickness was not assessed. Right ventricular systolic function is normal. Left Atrium: Left atrial size was mildly dilated. Right Atrium: Right atrial size was normal in size. Pericardium: There is no evidence of pericardial effusion. Mitral Valve: The mitral valve is normal in structure. Trivial mitral valve regurgitation. Tricuspid Valve: The tricuspid valve is normal in structure. Tricuspid valve regurgitation is not demonstrated. Aortic Valve: The aortic valve is normal in structure. Aortic valve regurgitation is not visualized. Pulmonic Valve: The pulmonic valve was normal in structure. Pulmonic valve regurgitation is not visualized. Aorta: The aortic root is normal in size and structure. Venous: The inferior vena cava is dilated in size with less than 50% respiratory variability, suggesting right atrial pressure of 15 mmHg. IAS/Shunts: No atrial level shunt detected by color flow Doppler.  LEFT VENTRICLE PLAX 2D LVIDd:         4.83 cm LVIDs:         3.52 cm LV PW:         0.93 cm LV IVS:        0.85 cm LVOT diam:     2.30 cm LVOT Area:     4.15 cm  RIGHT VENTRICLE RV Mid diam:    2.99 cm LEFT ATRIUM              Index LA diam:        4.60 cm  2.25 cm/m LA Vol (A2C):   106.0 ml 51.85 ml/m LA Vol (A4C):   62.3 ml  30.47 ml/m LA Biplane Vol: 82.3 ml  40.26 ml/m   AORTA Ao Root diam: 4.00 cm  SHUNTS Systemic Diam: 2.30 cm Kate Sable MD Electronically signed by Kate Sable MD Signature Date/Time: 11/12/2019/1:57:15 PM    Final    DG Femur Portable 1 View Right  Result Date: 11/11/2019 CLINICAL DATA:  Sepsis, pain EXAM: RIGHT FEMUR PORTABLE 1 VIEW COMPARISON:  MRI 07/25/2018 FINDINGS: Status post above the knee amputation. Gas locules within the stump soft tissues. Cut edge of the femur is smooth and without periostitis or gross bone destruction. No fracture identified IMPRESSION: 1. Status post above the knee amputation without definitive acute osseous abnormality 2. Small gas locules within the soft tissues distal to the residual femur, probably postoperative given recent history of AKA Electronically Signed   By: Donavan Foil M.D.   On: 11/11/2019 00:43   US Abdomen Limited RUQ  Result Date: 11/11/2019 CLINICAL DATA:  Initial evaluation for elevated LFTs. EXAM: ULTRASOUND ABDOMEN LIMITED RIGHT UPPER QUADRANT COMPARISON:  None. FINDINGS: Gallbladder: Few shadowing echogenic stones present within the gallbladder, largest of which measures 1.5 cm. Gallbladder wall measures within normal limits at 3 mm. Trace free pericholecystic fluid. No sonographic Murphy sign indicated by sonographer. Common bile duct: Diameter: 3 mm Liver: Liver demonstrates a nodular contour with coarse heterogeneous echotexture. No focal intrahepatic lesions. Portal vein is  patent on color Doppler imaging with normal direction of blood flow towards the liver. Other: None. IMPRESSION: 1. Cholelithiasis with trace free pericholecystic fluid. No other sonographic features to suggest acute cholecystitis. 2. No biliary dilatation. 3. Coarse heterogeneous echotexture of the liver with nodular contour, suggesting cirrhosis. Electronically Signed   By: Jeannine Boga M.D.   On: 11/11/2019 05:50        Scheduled Meds: . apixaban  5 mg Oral BID  . vitamin C  500 mg Oral Daily  . cholecalciferol  5,000 Units Oral Daily  . docusate sodium  100 mg Oral Daily  . ferrous sulfate  325 mg  Oral BID WC  . gabapentin  1,200 mg Oral TID  . insulin aspart  0-15 Units Subcutaneous TID PC & HS  . metoprolol tartrate  25 mg Oral BID  . multivitamin with minerals  1 tablet Oral Daily  . nystatin   Topical BID  . pantoprazole  20 mg Oral Daily  . pneumococcal 23 valent vaccine  0.5 mL Intramuscular Tomorrow-1000   Continuous Infusions: . sodium chloride Stopped (11/12/19 0217)  . piperacillin-tazobactam (ZOSYN)  IV 3.375 g (11/12/19 1222)  . vancomycin 166.7 mL/hr at 11/12/19 0218     LOS: 1 day    Time spent: 35 mins     Wyvonnia Dusky, MD [facility] Pager [phone]  If 7PM-7AM, please contact night-coverage [email or web address] 11/12/2019, 2:22 PM

## 2019-11-13 ENCOUNTER — Inpatient Hospital Stay: Payer: Medicare HMO | Admitting: Oncology

## 2019-11-13 ENCOUNTER — Inpatient Hospital Stay: Payer: Medicare HMO

## 2019-11-13 ENCOUNTER — Ambulatory Visit: Payer: Medicare HMO

## 2019-11-13 ENCOUNTER — Encounter: Payer: Self-pay | Admitting: Family Medicine

## 2019-11-13 DIAGNOSIS — R7401 Elevation of levels of liver transaminase levels: Secondary | ICD-10-CM

## 2019-11-13 DIAGNOSIS — Z888 Allergy status to other drugs, medicaments and biological substances status: Secondary | ICD-10-CM

## 2019-11-13 DIAGNOSIS — I4891 Unspecified atrial fibrillation: Secondary | ICD-10-CM

## 2019-11-13 DIAGNOSIS — Z95820 Peripheral vascular angioplasty status with implants and grafts: Secondary | ICD-10-CM

## 2019-11-13 DIAGNOSIS — I69398 Other sequelae of cerebral infarction: Secondary | ICD-10-CM

## 2019-11-13 DIAGNOSIS — R768 Other specified abnormal immunological findings in serum: Secondary | ICD-10-CM

## 2019-11-13 DIAGNOSIS — D649 Anemia, unspecified: Secondary | ICD-10-CM

## 2019-11-13 DIAGNOSIS — T8743 Infection of amputation stump, right lower extremity: Principal | ICD-10-CM

## 2019-11-13 DIAGNOSIS — H5461 Unqualified visual loss, right eye, normal vision left eye: Secondary | ICD-10-CM

## 2019-11-13 DIAGNOSIS — Z87891 Personal history of nicotine dependence: Secondary | ICD-10-CM

## 2019-11-13 DIAGNOSIS — Z7901 Long term (current) use of anticoagulants: Secondary | ICD-10-CM

## 2019-11-13 DIAGNOSIS — Z89611 Acquired absence of right leg above knee: Secondary | ICD-10-CM

## 2019-11-13 DIAGNOSIS — K5900 Constipation, unspecified: Secondary | ICD-10-CM

## 2019-11-13 DIAGNOSIS — I739 Peripheral vascular disease, unspecified: Secondary | ICD-10-CM

## 2019-11-13 DIAGNOSIS — K7460 Unspecified cirrhosis of liver: Secondary | ICD-10-CM

## 2019-11-13 DIAGNOSIS — D72829 Elevated white blood cell count, unspecified: Secondary | ICD-10-CM

## 2019-11-13 DIAGNOSIS — D696 Thrombocytopenia, unspecified: Secondary | ICD-10-CM

## 2019-11-13 LAB — COMPREHENSIVE METABOLIC PANEL
ALT: 195 U/L — ABNORMAL HIGH (ref 0–44)
AST: 201 U/L — ABNORMAL HIGH (ref 15–41)
Albumin: 2.3 g/dL — ABNORMAL LOW (ref 3.5–5.0)
Alkaline Phosphatase: 186 U/L — ABNORMAL HIGH (ref 38–126)
Anion gap: 10 (ref 5–15)
BUN: 33 mg/dL — ABNORMAL HIGH (ref 8–23)
CO2: 23 mmol/L (ref 22–32)
Calcium: 7.9 mg/dL — ABNORMAL LOW (ref 8.9–10.3)
Chloride: 98 mmol/L (ref 98–111)
Creatinine, Ser: 0.89 mg/dL (ref 0.61–1.24)
GFR calc Af Amer: >60 mL/min (ref >60)
GFR calc non Af Amer: >60 mL/min (ref >60)
Glucose, Bld: 194 mg/dL — ABNORMAL HIGH (ref 70–99)
Potassium: 4.8 mmol/L (ref 3.5–5.1)
Sodium: 131 mmol/L — ABNORMAL LOW (ref 135–145)
Total Bilirubin: 1.8 mg/dL — ABNORMAL HIGH (ref 0.3–1.2)
Total Protein: 6.3 g/dL — ABNORMAL LOW (ref 6.5–8.1)

## 2019-11-13 LAB — CBC
HCT: 28.6 % — ABNORMAL LOW (ref 39.0–52.0)
Hemoglobin: 8.6 g/dL — ABNORMAL LOW (ref 13.0–17.0)
MCH: 25.6 pg — ABNORMAL LOW (ref 26.0–34.0)
MCHC: 30.1 g/dL (ref 30.0–36.0)
MCV: 85.1 fL (ref 80.0–100.0)
Platelets: 116 10*3/uL — ABNORMAL LOW (ref 150–400)
RBC: 3.36 MIL/uL — ABNORMAL LOW (ref 4.22–5.81)
RDW: 17.3 % — ABNORMAL HIGH (ref 11.5–15.5)
WBC: 18.9 10*3/uL — ABNORMAL HIGH (ref 4.0–10.5)
nRBC: 1.2 % — ABNORMAL HIGH (ref 0.0–0.2)

## 2019-11-13 LAB — GLUCOSE, CAPILLARY
Glucose-Capillary: 175 mg/dL — ABNORMAL HIGH (ref 70–99)
Glucose-Capillary: 203 mg/dL — ABNORMAL HIGH (ref 70–99)
Glucose-Capillary: 198 mg/dL — ABNORMAL HIGH (ref 70–99)
Glucose-Capillary: 187 mg/dL — ABNORMAL HIGH (ref 70–99)

## 2019-11-13 LAB — PROTIME-INR
INR: 2.8 — ABNORMAL HIGH (ref 0.8–1.2)
Prothrombin Time: 29.2 seconds — ABNORMAL HIGH (ref 11.4–15.2)

## 2019-11-13 LAB — HAPTOGLOBIN: Haptoglobin: 289 mg/dL (ref 32–363)

## 2019-11-13 LAB — PATHOLOGIST SMEAR REVIEW

## 2019-11-13 IMAGING — CT CT ABD-PELV W/ CM
2 of 5 series · 16 of 46 positions shown, 18 images · IV contrast (APPLIED)
Comparison: 11/12/2019

CLINICAL DATA: Cirrhosis, hepatitis B positive, abnormal chest CT

EXAM:
CT ABDOMEN AND PELVIS WITH CONTRAST
TECHNIQUE: Multidetector CT imaging of the abdomen and pelvis was performed
using the standard protocol following bolus administration of
intravenous contrast.
CONTRAST:  100mL OMNIPAQUE IOHEXOL 300 MG/ML  SOLN

[Series 2: axial st · axial · 0.89mm/px · z∈[+43,+463]mm · 13 of 98 slices shown, 15 images]
[im 7/98  soft-tissue]
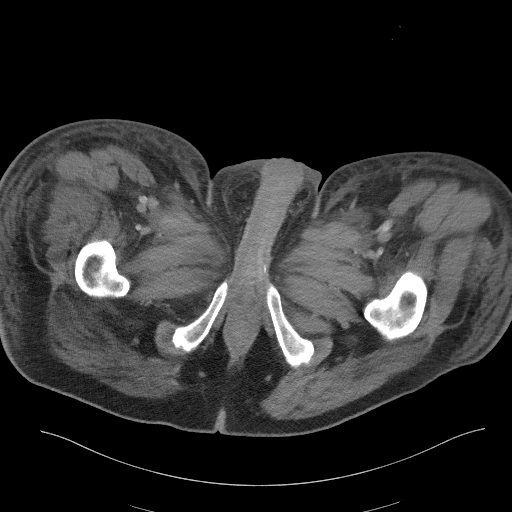
[im 7/98  bone]
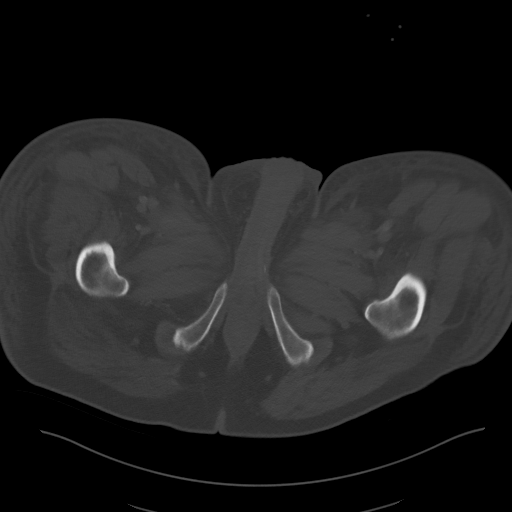
[im 13/98  soft-tissue]
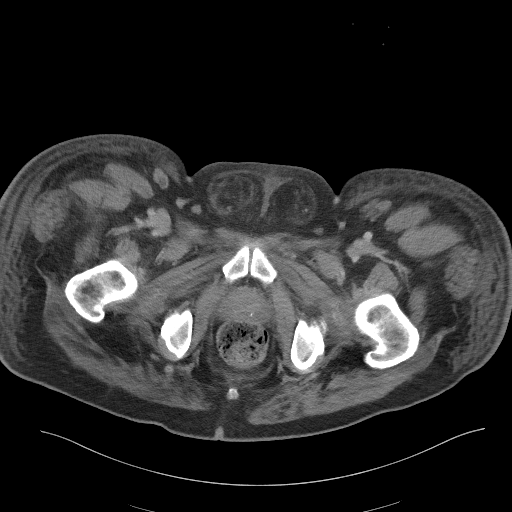
[im 20/98  soft-tissue]
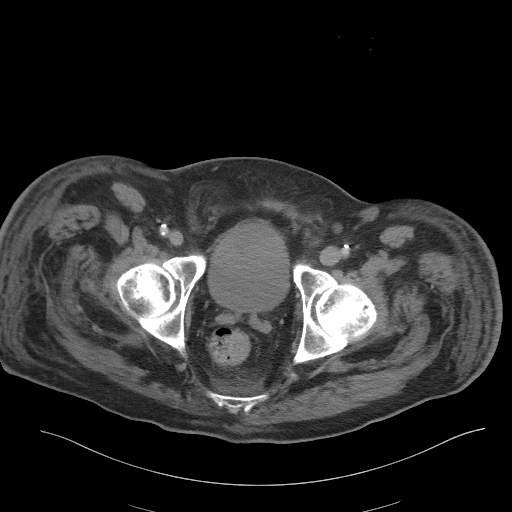
[im 26/98  soft-tissue]
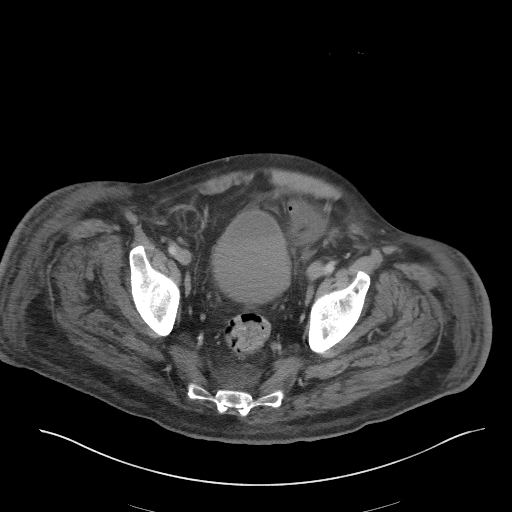
[im 33/98  soft-tissue]
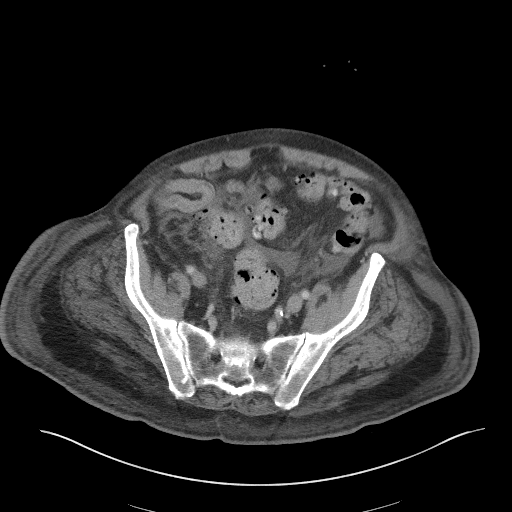
[im 39/98  soft-tissue]
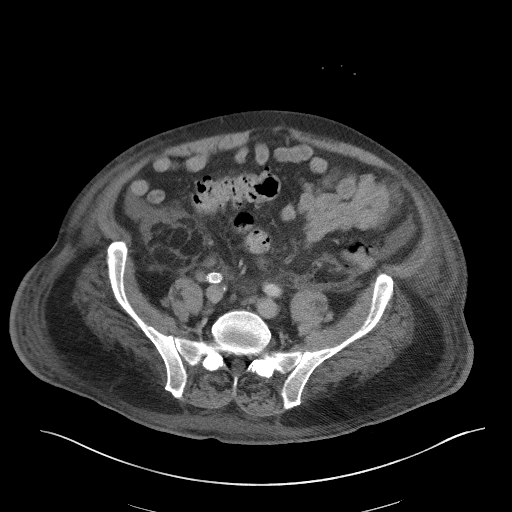
[im 52/98  soft-tissue]
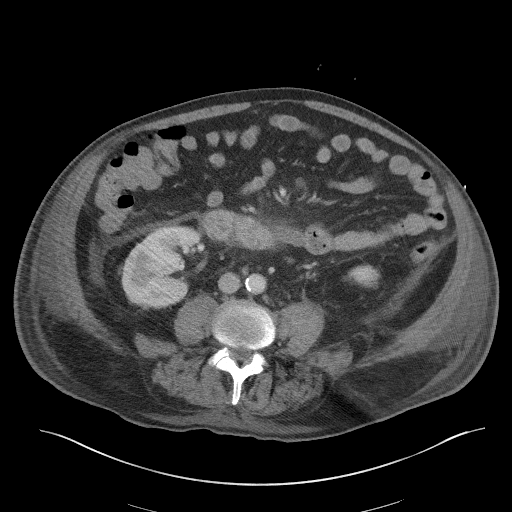
[im 59/98  soft-tissue]
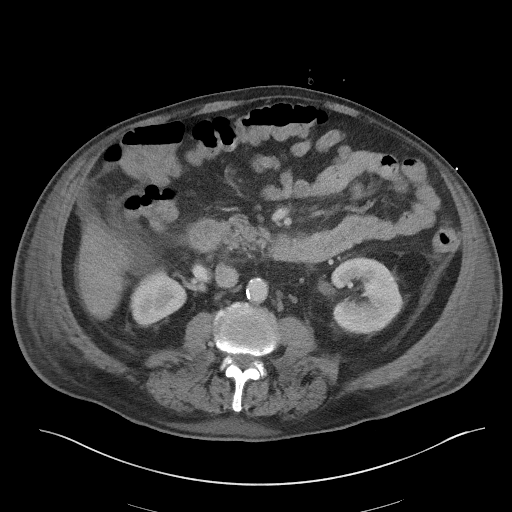
[im 65/98  soft-tissue]
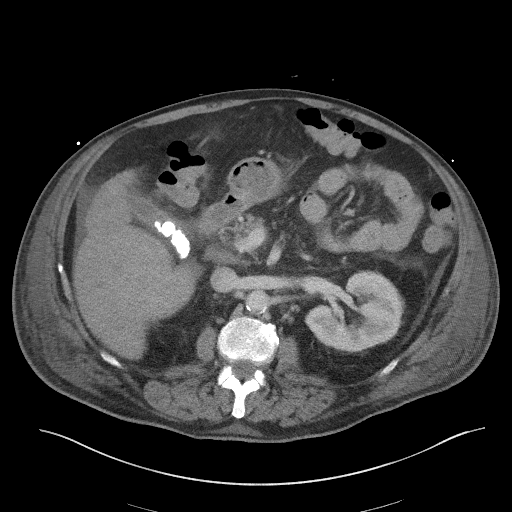
[im 65/98  bone]
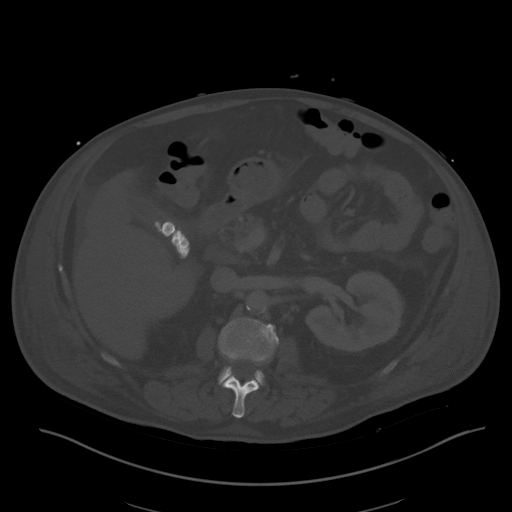
[im 72/98  soft-tissue]
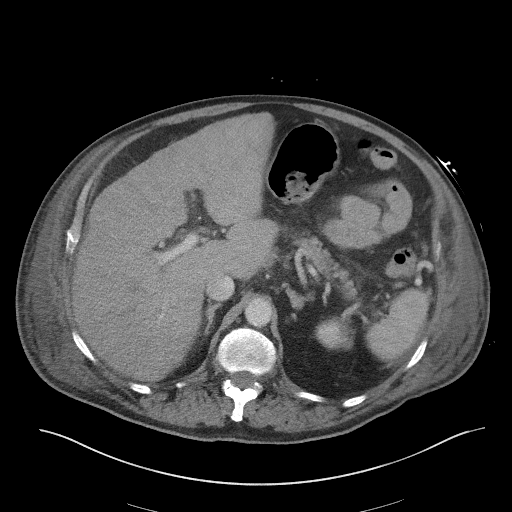
[im 78/98  soft-tissue]
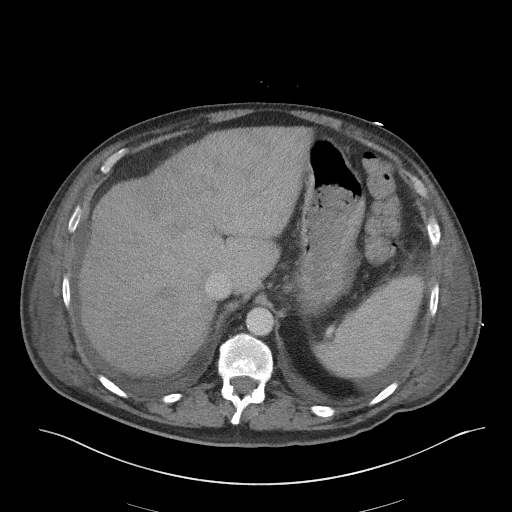
[im 85/98  soft-tissue]
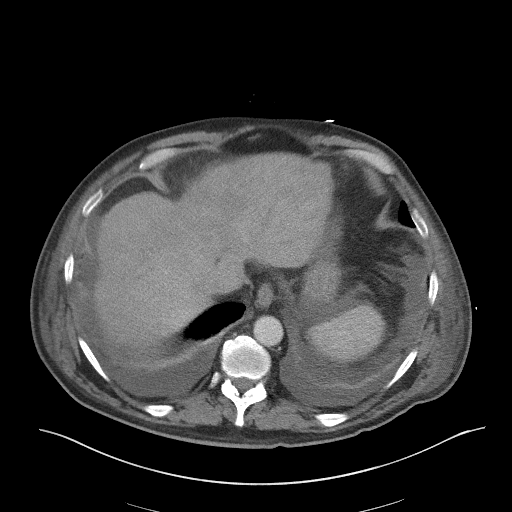
[im 91/98  soft-tissue]
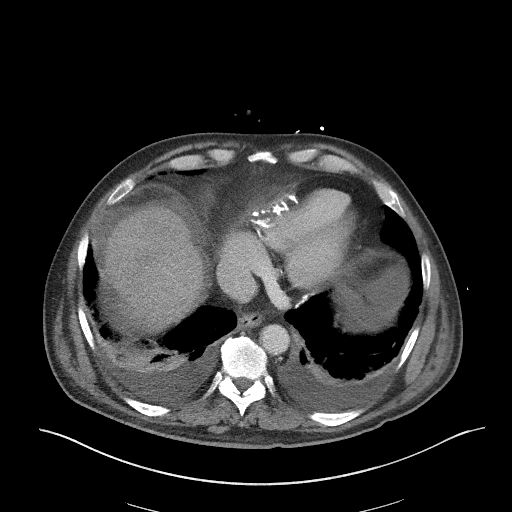

[Series 5: coronal st · coronal · 0.90mm/px · 3 of 110 slices shown]
[im 37/110  soft-tissue]
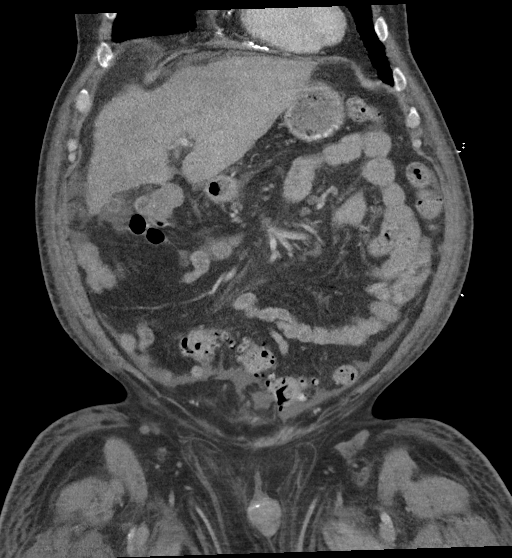
[im 49/110  soft-tissue]
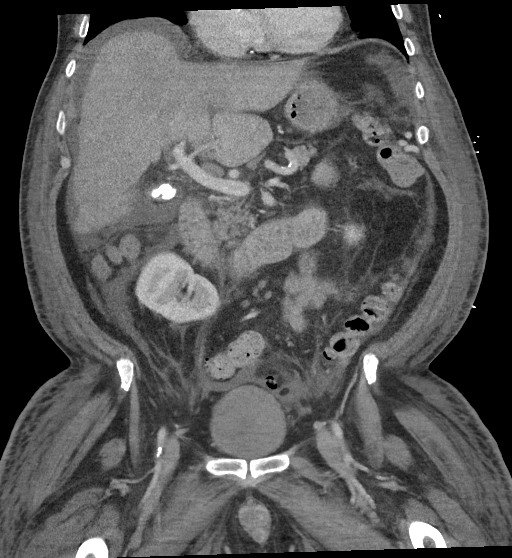
[im 61/110  soft-tissue]
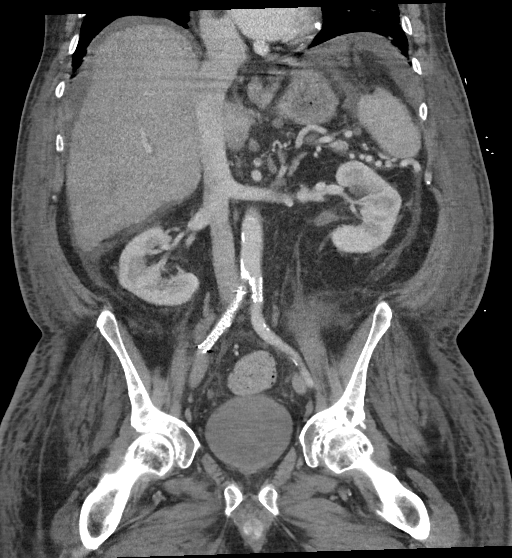

[16 of 46 positions shown; findings below may reference images not displayed]

FINDINGS: Lower chest: There are small bilateral pleural effusions with
scattered areas of dependent lower lobe atelectasis. Pericardial
calcifications are noted.

Hepatobiliary: The liver is diffusely heterogeneous, particular
involving the left lobe and superior aspect right lobe liver in the
region identified on recent chest CT. There is decreased
heterogeneity on delayed imaging through the liver. However, in a
patient with cirrhosis and known history of hepatitis, MRI is more
sensitive for detection of hepatic neoplasm.

There is no biliary dilation. Multiple calcified gallstones are seen
without cholecystitis.

Pancreas: Mild atrophy of the pancreatic head. Otherwise the
pancreas enhances normally.

Spleen: Normal in size without focal abnormality.

Adrenals/Urinary Tract: The kidneys enhance normally and
symmetrically. No urinary tract calculi or obstructive uropathy.
High attenuation within the bladder lumen likely reflects previously
excreted contrast from CT performed yesterday. The adrenals are
unremarkable.

Stomach/Bowel: No bowel obstruction or ileus. The appendix is
surgically absent. Diverticulosis of the sigmoid colon without
diverticulitis.

Vascular/Lymphatic: There are stents within the bilateral common
iliac arteries. Moderate atherosclerosis within the abdominal aorta.

Portacaval adenopathy is seen, index lymph node image 30 measuring
16 mm in short axis. Multiple subcentimeter retroperitoneal lymph
nodes are nonspecific.

Reproductive: Prostate is unremarkable.

Other: There is trace abdominal ascites. Diffuse subcutaneous edema
is seen involving the bilateral flanks, lower abdominal wall, and
pelvis.

No free intra-abdominal gas.  Fat containing right inguinal hernia.

Musculoskeletal: No acute or destructive bony lesions. Reconstructed
images demonstrate no additional findings.
IMPRESSION: 1. Continued findings of cirrhosis, with diffuse parenchymal
heterogeneity on portal venous imaging. This becomes less
heterogeneous on delayed imaging. If underlying hepatic neoplasm
remain suspected, MRI is a more sensitive evaluation.
2. Nonspecific portacaval lymphadenopathy.
3. Small bilateral pleural effusions, trace ascites, and diffuse
subcutaneous edema. Findings could be related to hypoproteinemic
state.
4. Cholelithiasis without cholecystitis.
5. Diverticulosis without diverticulitis.
6. Fat containing right inguinal hernia.

## 2019-11-13 MED ORDER — SODIUM CHLORIDE 0.9 % IV SOLN
2.0000 g | INTRAVENOUS | Status: DC
  Administered 2019-11-13 – 2019-11-15 (×3): 2 g via INTRAVENOUS
  Filled 2019-11-13: qty 2
  Filled 2019-11-13: qty 20
  Filled 2019-11-13 (×2): qty 2

## 2019-11-13 MED ORDER — ALPRAZOLAM 0.25 MG PO TABS
0.2500 mg | ORAL_TABLET | Freq: Every day | ORAL | Status: DC | PRN
  Administered 2019-11-13 – 2019-11-14 (×2): 0.25 mg via ORAL
  Filled 2019-11-13 (×2): qty 1

## 2019-11-13 MED ORDER — IOHEXOL 300 MG/ML  SOLN
100.0000 mL | Freq: Once | INTRAMUSCULAR | Status: AC | PRN
  Administered 2019-11-13: 100 mL via INTRAVENOUS

## 2019-11-13 MED ORDER — METRONIDAZOLE IN NACL 5-0.79 MG/ML-% IV SOLN
500.0000 mg | Freq: Three times a day (TID) | INTRAVENOUS | Status: DC
  Administered 2019-11-14 – 2019-11-16 (×9): 500 mg via INTRAVENOUS
  Filled 2019-11-13 (×10): qty 100

## 2019-11-13 NOTE — Progress Notes (Signed)
PROGRESS NOTE    Glenn B Schmelzer Jr.  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 Continue on IV vanco & zosyn. Blood cxs NGTD. Right leg culture growing gram positive rods. Urine cx growing multiple species, likely contaminate. S/p I&D of right AKA stump, skin & soft tissue 11/11/19 as per vascular surg. Continue w/ wound care. ID consulted.   Sepsis: secondary to above. Management as stated above   Atrial fibrillation: possibly PAF w/ RVR. D/c IV cadizem drip and continue on home dose of metoprolol. Hold metoprolol for HR<65. Continue to hold eliquis  Thrombocytopenia:  labile. Etiology unclear, medication ADR (aspirin, eliquis) vs infection  vs DIC vs cirrhosis. Pathologist smear pending.  PTT, fibrinogen, d-dimer, PT, LDH all elevated, haptoglobin pending. Will hold home dose of aspirin & eliquis   Likely cirrhosis: w/ ascites & likely liver lesion as per CTA. CT abd/pelvis ordered to assess possible liver lesion.  HBV surface antigen positive, likely acute hepatitis. Hepatitis B profile pending. Will hold home dose of aspirin & eliquis   Normocytic anemia: no need for a transfusion at this time. Possibly secondary to cirrhosis. Will continue to monitor   Leukocytosis: likely secondary to infection. Continue on IV abxs   Transaminitis: hepatitis panel shows positive HBV surface antigen. Likely cirrhosis on CTA. HBV profile pending. RUQ US shows cholelithiasis w/ trace free pericholecystic fluid, no other evidence to acute cholecystitis   Metabolic acidosis: resolved  Hyponatremia: possibly secondary to cirrhosis. Continue on IVFs   DM2: will continue on SSI w/ accuchecks. Will continue to hold jardiance and metformin   Dyslipidemia: continue to hold vytorin secondary to  transaminitis    DVT prophylaxis: none as thrombocytopenia, cirrhosis, increased INR; SCDs on LLE only  Code Status: full  Family Communication: discussed pt's care w/ pt's wife who was at bedside Disposition Plan:  Depends on PT/OT   Consultants:   Vascular surgery    Procedures:    Antimicrobials: zosyn, vanco   Subjective: Pt c/o R AKA stump pain  Objective: Vitals:   11/12/19 1940 11/12/19 1944 11/13/19 0416 11/13/19 0739  BP: 116/73 108/72 129/82 128/78  Pulse: 92 (!) 106 95 81  Resp: 20 18  18   Temp: 98 F (36.7 C) (!) 97.5 F (36.4 C) 97.6 F (36.4 C) 98 F (36.7 C)  TempSrc:  Oral Oral   SpO2: 96% 96% 97% 98%  Weight:   93.4 kg   Height:        Intake/Output Summary (Last 24 hours) at 11/13/2019 0809 Last data filed at 11/13/2019 0600 Gross per 24 hour  Intake 1461.9 ml  Output 225 ml  Net 1236.9 ml   Filed Weights   11/10/19 2350 11/13/19 0416  Weight: 88.5 kg 93.4 kg    Examination:  General exam: Appears calm and comfortable  Respiratory system: decreased breath sounds b/l. Cardiovascular system: irregularly irregular. No rubs, gallops or clicks.  Gastrointestinal system: Abdomen is nondistended, soft and tenderness to palpation. hypoactive bowel sounds heard. Central nervous system: Alert and oriented to self only  Psychiatry: Judgement and insight appear abnormal. Agitated & frustrated     Data Reviewed: I have personally reviewed following labs and imaging studies  CBC: Recent Labs  Lab 11/10/19 2357 11/11/19 0352 11/12/19 0855 11/13/19 0349  WBC 12.8* 14.1* 14.1* 18.9*  NEUTROABS 10.1*  --   --   --   HGB 9.5* 8.9* 8.5* 8.6*  HCT 30.0* 27.5* 27.2* 28.6*  MCV 80.4 80.6 81.2 85.1  PLT 91* 76* 144* [id number]*   Basic Metabolic Panel: Recent Labs  Lab 11/10/19 2357 11/11/19 0352 11/12/19 0855 11/13/19 0349  NA 130* 129* 129* 131*  K 4.0 4.2 4.1 4.8  CL 95* 98 96* 98  CO2 22 20* 22 23  GLUCOSE 145* 204* 309* 194*  BUN 20 21  35* 33*  CREATININE 0.83 0.75 0.84 0.89  CALCIUM 8.2* 7.7* 7.8* 7.9*   GFR: Estimated Creatinine Clearance: 93.4 mL/min (by C-G formula based on SCr of 0.89 mg/dL). Liver Function Tests: Recent Labs  Lab 11/10/19 2357 11/13/19 0349  AST 359* 201*  ALT 280* 195*  ALKPHOS 190* 186*  BILITOT 2.2* 1.8*  PROT 7.2 6.3*  ALBUMIN 2.5* 2.3*   Recent Labs  Lab 11/10/19 2357  LIPASE 16   No results for input(s): AMMONIA in the last 168 hours. Coagulation Profile: Recent Labs  Lab 11/10/19 2357 11/13/19 0723  INR 1.9* 2.8*   Cardiac Enzymes: No results for input(s): CKTOTAL, CKMB, CKMBINDEX, TROPONINI in the last 168 hours. BNP (last 3 results) No results for input(s): PROBNP in the last 8760 hours. HbA1C: No results for input(s): HGBA1C in the last 72 hours. CBG: Recent Labs  Lab 11/12/19 0817 11/12/19 1139 11/12/19 1613 11/12/19 2104 11/13/19 0738  GLUCAP 301* 237* 180* 198* 175*   Lipid Profile: No results for input(s): CHOL, HDL, LDLCALC, TRIG, CHOLHDL, LDLDIRECT in the last 72 hours. Thyroid Function Tests: No results for input(s): TSH, T4TOTAL, FREET4, T3FREE, THYROIDAB in the last 72 hours. Anemia Panel: No results for input(s): VITAMINB12, FOLATE, FERRITIN, TIBC, IRON, RETICCTPCT in the last 72 hours. Sepsis Labs: Recent Labs  Lab 11/10/19 2357 11/11/19 0352  PROCALCITON 1.84  --   LATICACIDVEN 1.7 1.5    Recent Results (from the past 240 hour(s))  Blood Culture (routine x 2)     Status: None (Preliminary result)   Collection Time: 11/10/19 11:57 PM   Specimen: BLOOD  Result Value Ref Range Status   Specimen Description BLOOD BLOOD LEFT HAND  Final   Special Requests   Final    BOTTLES DRAWN AEROBIC AND ANAEROBIC Blood Culture results may not be optimal due to an excessive volume of blood received in culture bottles   Culture   Final    NO GROWTH 2 DAYS Performed at [facility], [address], [place], [place] [address]    Report  Status PENDING  Incomplete  Blood Culture (routine x 2)     Status: None (Preliminary result)   Collection Time: 11/10/19 11:57 PM   Specimen: BLOOD  Result Value Ref Range Status   Specimen Description BLOOD BLOOD RIGHT HAND  Final   Special Requests   Final    BOTTLES DRAWN AEROBIC AND ANAEROBIC Blood Culture results may not be optimal due to an inadequate volume of blood received in culture bottles   Culture   Final    NO GROWTH 2 DAYS Performed at [facility], [address] [place], [place] [address]    Report Status PENDING  Incomplete  Urine culture     Status: Abnormal   Collection Time: 11/10/19 11:57 PM   Specimen: In/Out Cath Urine  Result Value Ref Range Status   Specimen Description   Final    IN/OUT CATH URINE Performed at [facility], [place]  [address] [place], [place] [address]    Special Requests   Final    NONE Performed at [facility], [address] [place], [place] [address]    Culture MULTIPLE SPECIES PRESENT, SUGGEST RECOLLECTION (A)  Final   Report Status 11/12/2019 FINAL  Final  Aerobic/Anaerobic Culture (surgical/deep wound)     Status: Abnormal (Preliminary result)   Collection Time: 11/10/19 11:57 PM   Specimen: Leg; Wound  Result Value Ref Range Status   Specimen Description   Final    LEG RIGHT Performed at [facility], [address] [place], [place] [address]    Special Requests   Final    Normal Performed at [facility], [address] [place], [place] [address]    Gram Stain   Final    NO WBC SEEN MODERATE GRAM POSITIVE RODS Performed at [name], [place] [address] [place], [place] [address]    Culture MULTIPLE ORGANISMS PRESENT, NONE PREDOMINANT (A)  Final   Report Status PENDING  Incomplete  Respiratory Panel by RT PCR (Flu A&B, Covid) - Nasopharyngeal Swab     Status: None   Collection Time: 11/11/19  1:46 AM   Specimen: Nasopharyngeal Swab  Result Value Ref Range  Status   SARS Coronavirus 2 by RT PCR NEGATIVE NEGATIVE Final    Comment: (NOTE) SARS-CoV-2 target nucleic acids are NOT DETECTED. The SARS-CoV-2 RNA is generally detectable in upper respiratoy specimens during the acute phase of infection. The lowest concentration of SARS-CoV-2 viral copies this assay can detect is 131 copies/mL. A negative result does not preclude SARS-Cov-2 infection and should not be used as the sole basis for treatment or other patient management decisions. A negative result may occur with  improper specimen collection/handling, submission of specimen other than nasopharyngeal swab, presence of viral mutation(s) within the areas targeted by this assay, and inadequate number of viral copies (<131 copies/mL). A negative result must be combined with clinical observations, patient history, and epidemiological information. The expected result is Negative. Fact Sheet for Patients:  PinkCheek.be Fact Sheet for Healthcare Providers:  GravelBags.it This test is not yet ap proved or cleared by the [place] FDA and  has been authorized for detection and/or diagnosis of SARS-CoV-2 by FDA under an Emergency Use Authorization (EUA). This EUA will remain  in effect (meaning this test can be used) for the duration of the COVID-19 declaration under Section 564(b)(1) of the Act, 21 U.S.C. section 360bbb-3(b)(1), unless the authorization is terminated or revoked sooner.    Influenza A by PCR NEGATIVE NEGATIVE Final   Influenza B by PCR NEGATIVE NEGATIVE Final    Comment: (NOTE) The Xpert Xpress SARS-CoV-2/FLU/RSV assay is intended as an aid in  the diagnosis of influenza from Nasopharyngeal swab specimens and  should not be used as a sole basis for treatment. Nasal washings and  aspirates are unacceptable for Xpert Xpress SARS-CoV-2/FLU/RSV  testing. Fact Sheet for  Patients: PinkCheek.be Fact Sheet for Healthcare Providers: GravelBags.it This test is not yet approved or cleared by the [place] FDA and  has been authorized for detection and/or diagnosis of SARS-CoV-2 by  FDA under an Emergency Use Authorization (EUA). This EUA will remain  in effect (meaning this test can be used) for the duration of the  Covid-19 declaration under Section 564(b)(1) of the Act, 21  U.S.C. section 360bbb-3(b)(1), unless the authorization is  terminated or revoked. Performed at [facility], [address] [place], [place] [address]  Aerobic/Anaerobic Culture (surgical/deep wound)     Status: None (Preliminary result)   Collection Time: 11/11/19  4:44 PM   Specimen: PATH Amputaion Arm/Leg; Tissue  Result Value Ref Range Status   Specimen Description   Final    WOUND Performed at [facility], [address] [place], [place] [address]    Special Requests   Final    NONE Performed at [facility], [address] [place], [place] [address]    Gram Stain   Final    FEW WBC PRESENT, PREDOMINANTLY PMN FEW GRAM POSITIVE COCCI IN CLUSTERS RARE GRAM NEGATIVE RODS Performed at [facility], [place] [address], [place],  [address]    Culture PENDING  Incomplete   Report Status PENDING  Incomplete         Radiology Studies: CT ANGIO CHEST PE W OR WO CONTRAST  Result Date: 11/12/2019 CLINICAL DATA:  Elevated D-dimer. Recent debridement above knee amputation stump. EXAM: CT ANGIOGRAPHY CHEST WITH CONTRAST TECHNIQUE: Multidetector CT imaging of the chest was performed using the standard protocol during bolus administration of intravenous contrast. Multiplanar CT image reconstructions and MIPs were obtained to evaluate the vascular anatomy. CONTRAST:  59mL OMNIPAQUE IOHEXOL 350 MG/ML SOLN COMPARISON:  Chest CT 08/12/2018 FINDINGS: Cardiovascular: No filling defects  within the pulmonary arteries to suggest acute pulmonary embolism. Coronary artery calcification and aortic atherosclerotic calcification. Mediastinum/Nodes: No axillary supraclavicular adenopathy. Mildly enlarged hilar nodes measure up to 10 mm short axis. Subcarinal node and RIGHT lower paratracheal nodes measures 11 mm. These nodes are slightly increased from comparison CT. No pericardial effusion. Calcified pericardium. Lungs/Pleura: Bilateral pleural effusions. No focal consolidation. Linear scarring in the LEFT upper lobe. Upper Abdomen: Gallstones present. Normal adrenal glands. Liver has a nodular contour. Small ascites surrounds the liver. Ill-defined hypodensities in the dome of the liver (image 81/4. Musculoskeletal: No aggressive osseous lesion. Review of the MIP images confirms the above findings. IMPRESSION: 1. No evidence acute pulmonary embolism. 2. Mild mediastinal hilar adenopathy is favored reactive. 3. Thin calcifications of the pericardium. 4. Nodular liver with ascites consistent with cirrhosis. 5. Cholelithiasis. 6. Ill-defined hypodensity in the dome liver. Consider abdominal pelvis CT with contrast to exclude hepatic lesion in patient with cirrhosis. Electronically Signed   By: Suzy Bouchard M.D.   On: 11/12/2019 10:08   ECHOCARDIOGRAM COMPLETE  Result Date: 11/12/2019    ECHOCARDIOGRAM REPORT   Patient Name:   Beaufort Philibert. Date of Exam: 11/12/2019 Medical Rec #:  [id number]           Height:       69.0 in Accession #:    [id number]          Weight:       195.1 lb Date of Birth:  1955-08-20           BSA:          2.044 m Patient Age:    64 years            BP:           119/67 mmHg Patient Gender: M                   HR:           80 bpm. Exam Location:  [facility] Procedure: 2D Echo Indications:     ATRIAL FIBRILLATION 437.31/ I48.91  History:         Patient has prior history of Echocardiogram examinations. Risk  Factors:Dyslipidemia, Current Smoker, Diabetes and                   Hypertension.  Sonographer:     Avanell Shackleton Referring Phys:  [id number] [place] Diagnosing Phys: Kate Sable MD IMPRESSIONS  1. Left ventricular ejection fraction, by estimation, is 50 to 55%. The left ventricle has low normal function. The left ventricle has no regional wall motion abnormalities. Left ventricular diastolic parameters are indeterminate.  2. Right ventricular systolic function is normal. The right ventricular size is normal.  3. Left atrial size was mildly dilated.  4. The mitral valve is normal in structure. Trivial mitral valve regurgitation.  5. The aortic valve is normal in structure. Aortic valve regurgitation is not visualized.  6. The inferior vena cava is dilated in size with <50% respiratory variability, suggesting right atrial pressure of 15 mmHg. FINDINGS  Left Ventricle: Left ventricular ejection fraction, by estimation, is 50 to 55%. The left ventricle has low normal function. The left ventricle has no regional wall motion abnormalities. The left ventricular internal cavity size was normal in size. There is no left ventricular hypertrophy. Left ventricular diastolic parameters are indeterminate. Right Ventricle: The right ventricular size is normal. Right vetricular wall thickness was not assessed. Right ventricular systolic function is normal. Left Atrium: Left atrial size was mildly dilated. Right Atrium: Right atrial size was normal in size. Pericardium: There is no evidence of pericardial effusion. Mitral Valve: The mitral valve is normal in structure. Trivial mitral valve regurgitation. Tricuspid Valve: The tricuspid valve is normal in structure. Tricuspid valve regurgitation is not demonstrated. Aortic Valve: The aortic valve is normal in structure. Aortic valve regurgitation is not visualized. Pulmonic Valve: The pulmonic valve was normal in structure. Pulmonic valve regurgitation is not visualized. Aorta: The aortic root is normal in size and structure.  Venous: The inferior vena cava is dilated in size with less than 50% respiratory variability, suggesting right atrial pressure of 15 mmHg. IAS/Shunts: No atrial level shunt detected by color flow Doppler.  LEFT VENTRICLE PLAX 2D LVIDd:         4.83 cm LVIDs:         3.52 cm LV PW:         0.93 cm LV IVS:        0.85 cm LVOT diam:     2.30 cm LVOT Area:     4.15 cm  RIGHT VENTRICLE RV Mid diam:    2.99 cm LEFT ATRIUM              Index LA diam:        4.60 cm  2.25 cm/m LA Vol (A2C):   106.0 ml 51.85 ml/m LA Vol (A4C):   62.3 ml  30.47 ml/m LA Biplane Vol: 82.3 ml  40.26 ml/m   AORTA Ao Root diam: 4.00 cm  SHUNTS Systemic Diam: 2.30 cm Kate Sable MD Electronically signed by Kate Sable MD Signature Date/Time: 11/12/2019/1:57:15 PM    Final         Scheduled Meds: . apixaban  5 mg Oral BID  . vitamin C  500 mg Oral Daily  . cholecalciferol  5,000 Units Oral Daily  . docusate sodium  100 mg Oral Daily  . ferrous sulfate  325 mg Oral BID WC  . gabapentin  1,200 mg Oral TID  . insulin aspart  0-15 Units Subcutaneous TID PC & HS  . metoprolol tartrate  25 mg Oral BID  . multivitamin with  minerals  1 tablet Oral Daily  . nystatin   Topical BID  . pantoprazole  20 mg Oral Daily  . pneumococcal 23 valent vaccine  0.5 mL Intramuscular Tomorrow-1000   Continuous Infusions: . sodium chloride 50 mL/hr at 11/12/19 1627  . sodium chloride 50 mL/hr at 11/12/19 1800  . piperacillin-tazobactam (ZOSYN)  IV 3.375 g (11/13/19 0551)  . vancomycin 1,250 mg (11/13/19 0329)     LOS: 2 days    Time spent: 36 mins     Wyvonnia Dusky, MD [facility] Pager [phone]  If 7PM-7AM, please contact night-coverage [email or web address] 11/13/2019, 8:09 AM

## 2019-11-13 NOTE — Consult Note (Addendum)
NAME: Glenn B Bleiler Jr.  DOB: 13-Dec-1955  MRN: [id number]  Date/Time: 11/13/2019 2:12 PM  REQUESTING PROVIDER: Dr.Williams Subjective:  REASON FOR CONSULT: rt aka stump infection ?  [name]. is a 63 y.o.male  with a history of  CVA, blind rt eye, PAD, rt foot gangrene, Rt leg BKA recently underwent Rt above knee amputation on 10/26/19 for a non healing BKA stump which was present for a year. He was admitted on 4/9 with declining health and altered mental status. Also had purulent drainage from the stump and fever of 101.4   Labs revealed a wbc of 12.8 , hb 9.5, AST 359, ALT 280, alk po4 was 190 He underwent surgical debridement of the stump site on 11/11/19 and culture pending-He is currently on vanco and zosyn I am seeing the patient for the infection  Past Medical History:  Diagnosis Date  . Acute left PCA stroke ([place]) 02/17/2015  . Anemia    vitamin b and vit d deficiencies  . 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 Atherosclerosis of native arteries of the extremities with gangrene ([place]) 05/08/2018  . Atrial fibrillation ([place])   . Blind    right eye  . Complication of anesthesia 2019   disoriented, trying to jump out of bed after bka  . Coronary artery calcification seen on CT scan    a. 08/2018 CT Chest: Mild cor Ca2+.  . Diabetes mellitus with complication ([place])   . Dilated aortic root ([place])    a. 04/2018 Echo: 4.1cm. Asc Ao 3.5cm; b. 04/2018 CT: Asc Ao 3.6cm. 4.1cm @ sinus of Valsalva.  . Dysrhythmia    a fib  . Gangrene of right foot ([place]) 06/02/2018  . GERD (gastroesophageal reflux disease)   . History of echocardiogram    a. 04/2018 Echo: Ef 60-65%, no rwma, midly to mod dil Ao root - 4.1cm. Asc Ao 3.5cm. Mild MR. Nl RV fxn. Nl PASP.  Marland Kitchen History of hernia repair   . History of stress test    a. 2016 MV ([name]): EF 58%, no ischemia.  . Hyperlipidemia   . Hypertension   . Leg pain   . Legally blind    right eye homonymous  hemianopsa  . Memory change    d/t strokes  . Necrotic toes ([place]) 02/21/2018  . PAF (paroxysmal atrial fibrillation) ([name])    a. on Eliquis as of 2018; b. CHADS2VASc => 5 (HTN, DM, stroke x 2, vascular disease)  . Peripheral vascular disease ([place])    a. followed by Dr. Lucky Cowboy; b. s/p kissing balloon stents and right external iliac stent in 10/2017; c. 05/2018 s/p R BKA; c. 08/2019: Bilat Iliac PTA & DBA or REIA.  . Pulmonary nodules   . Stroke [facility])    a. 2016 & 2018  . Stroke [facility]) 07/11/2018    Past Surgical History:  Procedure Laterality Date  . ABDOMINAL AORTA STENT     x 2  . AMPUTATION Right 06/02/2018   Procedure: AMPUTATION BELOW KNEE;  Surgeon: [name], MD;  Location: [facility] ORS;  Service: Vascular;  Laterality: Right;  . AMPUTATION Right 10/26/2019   Procedure: RIGHT AMPUTATION ABOVE KNEE;  Surgeon: [name], MD;  Location: [facility] ORS;  Service: Vascular;  Laterality: Right;  . APPENDECTOMY    . COLONOSCOPY WITH PROPOFOL N/A 05/12/2018   Procedure: COLONOSCOPY WITH PROPOFOL;  Surgeon: [name], MD;  Location: [facility] ENDOSCOPY;  Service: Gastroenterology;  Laterality: N/A;  .  ESOPHAGOGASTRODUODENOSCOPY (EGD) WITH PROPOFOL N/A 05/12/2018   Procedure: ESOPHAGOGASTRODUODENOSCOPY (EGD) WITH PROPOFOL;  Surgeon: [name], MD;  Location: [facility] ENDOSCOPY;  Service: Gastroenterology;  Laterality: N/A;  . GIVENS CAPSULE STUDY N/A 07/13/2018   Procedure: GIVENS CAPSULE STUDY;  Surgeon: [name], MD;  Location: [facility] ENDOSCOPY;  Service: Gastroenterology;  Laterality: N/A;  . HERNIA REPAIR     UMBILICAL  . INCISION AND DRAINAGE Right 11/11/2019   Procedure: INCISION AND DRAINAGE RIGHT ABOVE THE KNEE STUMP;  Surgeon: Evaristo Bury, MD;  Location: [facility] ORS;  Service: Vascular;  Laterality: Right;  . LEG AMPUTATION Right 10/26/2019   prior right BKA required AKA for non-healing ulcer and wound infection  . LOWER EXTREMITY ANGIOGRAPHY Right 10/25/2017   Procedure: LOWER EXTREMITY  ANGIOGRAPHY;  Surgeon: [name], MD;  Location: [place] CV LAB;  Service: Cardiovascular;  Laterality: Right;  . LOWER EXTREMITY ANGIOGRAPHY Right 01/13/2018   Procedure: LOWER EXTREMITY ANGIOGRAPHY;  Surgeon: [name], MD;  Location: [place] CV LAB;  Service: Cardiovascular;  Laterality: Right;  . LOWER EXTREMITY ANGIOGRAPHY Right 08/28/2019   Procedure: LOWER EXTREMITY ANGIOGRAPHY;  Surgeon: [name], MD;  Location: [place] CV LAB;  Service: Cardiovascular;  Laterality: Right;  . LOWER EXTREMITY INTERVENTION  10/25/2017   Procedure: LOWER EXTREMITY INTERVENTION;  Surgeon: [name], MD;  Location: [place] CV LAB;  Service: Cardiovascular;;  . SKIN SPLIT GRAFT Right 12/28/2018   Procedure: SKIN GRAFT SPLIT THICKNESS ( SYNTHETIC );  Surgeon: [name], MD;  Location: [facility] ORS;  Service: Vascular;  Laterality: Right;  . TONSILLECTOMY    . WOUND DEBRIDEMENT Right 08/08/2018   Procedure: DEBRIDEMENT WOUND WITH WOUND VAC APPLICATION;  Surgeon: [name], MD;  Location: [facility] ORS;  Service: Vascular;  Laterality: Right;    Social History   Socioeconomic History  . Marital status: Married    Spouse name: debra  . Number of children: 0  . Years of education: Not on file  . Highest education level: Associate degree: academic program  Occupational History    Comment: disabled  Tobacco Use  . Smoking status: Former Smoker    Packs/day: 1.00    Years: 30.00    Pack years: 30.00    Types: Cigarettes    Quit date: 01/09/2018    Years since quitting: 1.8  . Smokeless tobacco: Never Used  Substance and Sexual Activity  . Alcohol use: Yes    Alcohol/week: 0.0 standard drinks    Comment: occasionally beer   . Drug use: No  . Sexual activity: Yes  Other Topics Concern  . Not on file  Social History Narrative   Lives with wife in a ranch style house.  Uses a walker or a wheelchair to get around.   Social Determinants of Health   Financial Resource Strain:    . Difficulty of Paying Living Expenses:   Food Insecurity:   . Worried About Charity fundraiser in the Last Year:   . Arboriculturist in the Last Year:   Transportation Needs:   . Film/video editor (Medical):   Marland Kitchen Lack of Transportation (Non-Medical):   Physical Activity:   . Days of Exercise per Week:   . Minutes of Exercise per Session:   Stress:   . Feeling of Stress :   Social Connections:   . Frequency of Communication with Friends and Family:   . Frequency of Social Gatherings with Friends and Family:   .  Attends Religious Services:   . Active Member of Clubs or Organizations:   . Attends Archivist Meetings:   Marland Kitchen Marital Status:   Intimate Partner Violence:   . Fear of Current or Ex-Partner:   . Emotionally Abused:   Marland Kitchen Physically Abused:   . Sexually Abused:     Family History  Problem Relation Age of Onset  . Diabetes Father   . Hypertension Father   . Hyperlipidemia Father    Allergies  Allergen Reactions  . Sodium Pentobarbital [Pentobarbital] Shortness Of Breath  . Lipitor [Atorvastatin] Rash   ID  Recent  Procedure Surgery Injections Trauma Sick contacts Travel Antibiotic use Food- raw/exotic Steroid/immune suppressants/splenectomy/Hardware Animal bites Tick exposure Water sports Fishing/hunting/animal bird exposure ? Current Facility-Administered Medications  Medication Dose Route Frequency Provider Last Rate Last Admin  . 0.9 %  sodium chloride infusion   Intravenous PRN Wyvonnia Dusky, MD 50 mL/hr at 11/12/19 1627 Rate Change at 11/12/19 1627  . 0.9 %  sodium chloride infusion   Intravenous Continuous Wyvonnia Dusky, MD 50 mL/hr at 11/12/19 1800 Rate Verify at 11/12/19 1800  . acetaminophen (TYLENOL) tablet 650 mg  650 mg Oral Q6H PRN Mansy, Jan A, MD       Or  . acetaminophen (TYLENOL) suppository 650 mg  650 mg Rectal Q6H PRN Mansy, Jan A, MD      . ALPRAZolam Duanne Moron) tablet 0.25 mg  0.25 mg Oral Daily PRN  Wyvonnia Dusky, MD      . alum & mag hydroxide-simeth (MAALOX/MYLANTA) 200-200-20 MG/5ML suspension 30 mL  30 mL Oral Q4H PRN Mansy, Jan A, MD      . ascorbic acid (VITAMIN C) tablet 500 mg  500 mg Oral Daily Mansy, Jan A, MD   500 mg at 11/13/19 8889  . cholecalciferol (VITAMIN D3) tablet 5,000 Units  5,000 Units Oral Daily Mansy, Arvella Merles, MD   5,000 Units at 11/13/19 [phone]  . docusate sodium (COLACE) capsule 100 mg  100 mg Oral Daily Mansy, Jan A, MD   100 mg at 11/13/19 0823  . ferrous sulfate tablet 325 mg  325 mg Oral BID WC Mansy, Jan A, MD   325 mg at 11/13/19 0823  . gabapentin (NEURONTIN) tablet 1,200 mg  1,200 mg Oral TID Mansy, Jan A, MD   1,200 mg at 11/13/19 0823  . HYDROmorphone (DILAUDID) injection 1 mg  1 mg Intravenous Q4H PRN Wyvonnia Dusky, MD   1 mg at 11/13/19 1252  . insulin aspart (novoLOG) injection 0-15 Units  0-15 Units Subcutaneous TID PC & HS Mansy, Arvella Merles, MD   5 Units at 11/13/19 1248  . magnesium hydroxide (MILK OF MAGNESIA) suspension 30 mL  30 mL Oral Daily PRN Mansy, Jan A, MD      . metoprolol tartrate (LOPRESSOR) injection 5 mg  5 mg Intravenous Q6H PRN Wyvonnia Dusky, MD   5 mg at 11/12/19 1628  . metoprolol tartrate (LOPRESSOR) tablet 25 mg  25 mg Oral BID Mansy, Jan A, MD   25 mg at 11/13/19 5038  . multivitamin with minerals tablet 1 tablet  1 tablet Oral Daily Mansy, Jan A, MD   1 tablet at 11/13/19 0824  . nystatin (MYCOSTATIN/NYSTOP) topical powder   Topical BID Evaristo Bury, MD   Given at 11/13/19 0830  . ondansetron (ZOFRAN) tablet 4 mg  4 mg Oral Q6H PRN Mansy, Arvella Merles, MD       Or  .  ondansetron (ZOFRAN) injection 4 mg  4 mg Intravenous Q6H PRN Mansy, Jan A, MD   4 mg at 11/11/19 1615  . oxyCODONE (Oxy IR/ROXICODONE) immediate release tablet 10 mg  10 mg Oral Q8H PRN Wyvonnia Dusky, MD   10 mg at 11/13/19 0824  . pantoprazole (PROTONIX) EC tablet 20 mg  20 mg Oral Daily Mansy, Jan A, MD   20 mg at 11/13/19 0824  .  piperacillin-tazobactam (ZOSYN) IVPB 3.375 g  3.375 g Intravenous Q8H Mansy, Jan A, MD 12.5 mL/hr at 11/13/19 0551 3.375 g at 11/13/19 0551  . pneumococcal 23 valent vaccine (PNEUMOVAX-23) injection 0.5 mL  0.5 mL Intramuscular Tomorrow-1000 Wyvonnia Dusky, MD      . traZODone (DESYREL) tablet 25 mg  25 mg Oral QHS PRN Mansy, Jan A, MD   25 mg at 11/12/19 0030  . vancomycin (VANCOREADY) IVPB 1250 mg/250 mL  1,250 mg Intravenous Q12H Mansy, Jan A, MD 166.7 mL/hr at 11/13/19 0329 1,250 mg at 11/13/19 0329     Abtx:  Anti-infectives (From admission, onward)   Start     Dose/Rate Route Frequency Ordered Stop   11/11/19 1400  vancomycin (VANCOREADY) IVPB 1250 mg/250 mL     1,250 mg 166.7 mL/hr over 90 Minutes Intravenous Every 12 hours 11/11/19 0158     11/11/19 0600  piperacillin-tazobactam (ZOSYN) IVPB 3.375 g     3.375 g 12.5 mL/hr over 240 Minutes Intravenous Every 8 hours 11/11/19 0322     11/11/19 0215  vancomycin (VANCOCIN) IVPB 1000 mg/200 mL premix  Status:  Discontinued     1,000 mg 200 mL/hr over 60 Minutes Intravenous  Once 11/11/19 0214 11/11/19 0321   11/11/19 0215  piperacillin-tazobactam (ZOSYN) IVPB 3.375 g  Status:  Discontinued     3.375 g 100 mL/hr over 30 Minutes Intravenous Every 6 hours 11/11/19 0214 11/11/19 0321   11/11/19 0015  vancomycin (VANCOCIN) IVPB 1000 mg/200 mL premix     1,000 mg 200 mL/hr over 60 Minutes Intravenous  Once 11/11/19 0010 11/11/19 0252   11/10/19 2345  vancomycin (VANCOCIN) IVPB 1000 mg/200 mL premix     1,000 mg 200 mL/hr over 60 Minutes Intravenous  Once 11/10/19 2342 11/11/19 0127   11/10/19 2345  cefTRIAXone (ROCEPHIN) 2 g in sodium chloride 0.9 % 100 mL IVPB     2 g 200 mL/hr over 30 Minutes Intravenous  Once 11/10/19 2342 11/11/19 0131      REVIEW OF SYSTEMS:  Pt not a reliable historian- lethargic Is constipated  Wife at bedside Objective:  VITALS:  BP 139/87 (BP Location: Right Arm)   Pulse 89   Temp 97.6 F (36.4 C)  (Oral)   Resp 18   Ht 5' 9"  (1.753 m)   Wt 93.4 kg   SpO2 95%   BMI 30.39 kg/m  PHYSICAL EXAM:  General: Awake, some lethargy, moaning, pale Head: Normocephalic, without obvious abnormality, atraumatic. Eyes: Conjunctivae clear, anicteric sclerae. Pupils are equal ENT Nares normal. No drainage or sinus tenderness. Lips, mucosa, and tongue normal. No Thrush Neck: Supple,  Back: did not examine. Lungs: b/l air entry Heart: irregular Abdomen: distended Extremities: rt AKA- surgical dressing not removed Left leg edema Skin: No rashes or lesions. Or bruising Lymph: Cervical, supraclavicular normal. Neurologic: did not examine in detail Pertinent Labs Lab Results CBC    Component Value Date/Time   WBC 18.9 (H) 11/13/2019 0349   RBC 3.36 (L) 11/13/2019 0349   HGB 8.6 (L) 11/13/2019 9191  HGB 11.2 (L) 10/23/2019 1403   HCT 28.6 (L) 11/13/2019 0349   HCT 35.0 (L) 10/23/2019 1403   PLT 116 (L) 11/13/2019 0349   PLT 199 10/23/2019 1403   MCV 85.1 11/13/2019 0349   MCV 84 10/23/2019 1403   MCH 25.6 (L) 11/13/2019 0349   MCHC 30.1 11/13/2019 0349   RDW 17.3 (H) 11/13/2019 0349   RDW 14.3 10/23/2019 1403   LYMPHSABS 1.0 11/10/2019 2357   LYMPHSABS 0.9 10/23/2019 1403   MONOABS 0.9 11/10/2019 2357   EOSABS 0.2 11/10/2019 2357   EOSABS 0.3 10/23/2019 1403   BASOSABS 0.1 11/10/2019 2357   BASOSABS 0.0 10/23/2019 1403    CMP Latest Ref Rng & Units 11/13/2019 11/12/2019 11/11/2019  Glucose 70 - 99 mg/dL 194(H) 309(H) 204(H)  BUN 8 - 23 mg/dL 33(H) 35(H) 21  Creatinine 0.61 - 1.24 mg/dL 0.89 0.84 0.75  Sodium 135 - 145 mmol/L 131(L) 129(L) 129(L)  Potassium 3.5 - 5.1 mmol/L 4.8 4.1 4.2  Chloride 98 - 111 mmol/L 98 96(L) 98  CO2 22 - 32 mmol/L 23 22 20(L)  Calcium 8.9 - 10.3 mg/dL 7.9(L) 7.8(L) 7.7(L)  Total Protein 6.5 - 8.1 g/dL 6.3(L) - -  Total Bilirubin 0.3 - 1.2 mg/dL 1.8(H) - -  Alkaline Phos 38 - 126 U/L 186(H) - -  AST 15 - 41 U/L 201(H) - -  ALT 0 - 44 U/L 195(H) -  -      Microbiology: Recent Results (from the past 240 hour(s))  Blood Culture (routine x 2)     Status: None (Preliminary result)   Collection Time: 11/10/19 11:57 PM   Specimen: BLOOD  Result Value Ref Range Status   Specimen Description BLOOD BLOOD LEFT HAND  Final   Special Requests   Final    BOTTLES DRAWN AEROBIC AND ANAEROBIC Blood Culture results may not be optimal due to an excessive volume of blood received in culture bottles   Culture   Final    NO GROWTH 2 DAYS Performed at [facility], [address] [place], [place] [address]    Report Status PENDING  Incomplete  Blood Culture (routine x 2)     Status: None (Preliminary result)   Collection Time: 11/10/19 11:57 PM   Specimen: BLOOD  Result Value Ref Range Status   Specimen Description BLOOD BLOOD RIGHT HAND  Final   Special Requests   Final    BOTTLES DRAWN AEROBIC AND ANAEROBIC Blood Culture results may not be optimal due to an inadequate volume of blood received in culture bottles   Culture   Final    NO GROWTH 2 DAYS Performed at [facility], [address], [place], [place] [address]    Report Status PENDING  Incomplete  Urine culture     Status: Abnormal   Collection Time: 11/10/19 11:57 PM   Specimen: In/Out Cath Urine  Result Value Ref Range Status   Specimen Description   Final    IN/OUT CATH URINE Performed at [facility], [address] [place], [place] [address]    Special Requests   Final    NONE Performed at [facility], [address] [place], [place] [address]    Culture MULTIPLE SPECIES PRESENT, SUGGEST RECOLLECTION (A)  Final   Report Status 11/12/2019 FINAL  Final  Aerobic/Anaerobic Culture (surgical/deep wound)     Status: Abnormal (Preliminary result)   Collection Time: 11/10/19 11:57 PM   Specimen: Leg; Wound  Result Value Ref Range Status  Specimen Description   Final    LEG RIGHT Performed at [facility], [address] [place], [place] [address]    Special Requests   Final    Normal Performed at [facility], [address] [place], [place] [address]    Gram Stain   Final    NO WBC SEEN MODERATE GRAM POSITIVE RODS Performed at [facility], [place] [address] [place], [place] [address]    Culture MULTIPLE ORGANISMS PRESENT, NONE PREDOMINANT (A)  Final   Report Status PENDING  Incomplete  Respiratory Panel by RT PCR (Flu A&B, Covid) - Nasopharyngeal Swab     Status: None   Collection Time: 11/11/19  1:46 AM   Specimen: Nasopharyngeal Swab  Result Value Ref Range Status   SARS Coronavirus 2 by RT PCR NEGATIVE NEGATIVE Final    Comment: (NOTE) SARS-CoV-2 target nucleic acids are NOT DETECTED. The SARS-CoV-2 RNA is generally detectable in upper respiratoy specimens during the acute phase of infection. The lowest concentration of SARS-CoV-2 viral copies this assay can detect is 131 copies/mL. A negative result does not preclude SARS-Cov-2 infection and should not be used as the sole basis for treatment or other patient management decisions. A negative result may occur with  improper specimen collection/handling, submission of specimen other than nasopharyngeal swab, presence of viral mutation(s) within the areas targeted by this assay, and inadequate number of viral copies (<131 copies/mL). A negative result must be combined with clinical observations, patient history, and epidemiological information. The expected result is Negative. Fact Sheet for Patients:  PinkCheek.be Fact Sheet for Healthcare Providers:  GravelBags.it This test is not yet ap proved or cleared by the [place] FDA and  has been authorized for detection and/or diagnosis of SARS-CoV-2 by FDA under an Emergency Use Authorization (EUA). This EUA will remain  in effect (meaning this test can be used) for the duration of the COVID-19 declaration under Section  564(b)(1) of the Act, 21 U.S.C. section 360bbb-3(b)(1), unless the authorization is terminated or revoked sooner.    Influenza A by PCR NEGATIVE NEGATIVE Final   Influenza B by PCR NEGATIVE NEGATIVE Final    Comment: (NOTE) The Xpert Xpress SARS-CoV-2/FLU/RSV assay is intended as an aid in  the diagnosis of influenza from Nasopharyngeal swab specimens and  should not be used as a sole basis for treatment. Nasal washings and  aspirates are unacceptable for Xpert Xpress SARS-CoV-2/FLU/RSV  testing. Fact Sheet for Patients: PinkCheek.be Fact Sheet for Healthcare Providers: GravelBags.it This test is not yet approved or cleared by the [place] FDA and  has been authorized for detection and/or diagnosis of SARS-CoV-2 by  FDA under an Emergency Use Authorization (EUA). This EUA will remain  in effect (meaning this test can be used) for the duration of the  Covid-19 declaration under Section 564(b)(1) of the Act, 21  U.S.C. section 360bbb-3(b)(1), unless the authorization is  terminated or revoked. Performed at [facility], [address] [place], [place] [address]   Aerobic/Anaerobic Culture (surgical/deep wound)     Status: None (Preliminary result)   Collection Time: 11/11/19  4:44 PM   Specimen: PATH Amputaion Arm/Leg; Tissue  Result Value Ref Range Status   Specimen Description   Final    WOUND Performed at [facility], [address] [place], [place] [address]    Special Requests   Final    NONE Performed at [facility], [address] [place],  [address]  Gram Stain   Final    FEW WBC PRESENT, PREDOMINANTLY PMN FEW GRAM POSITIVE COCCI IN CLUSTERS RARE GRAM NEGATIVE RODS    Culture   Final    CULTURE REINCUBATED FOR BETTER GROWTH Performed at [facility], [place] [address] [place], [place] [address]    Report Status PENDING  Incomplete    IMAGING RESULTS: I  have personally reviewed the films ? Impression/Recommendation ? ?Rt AKA stump -ischemia/wound- s/p debridement Follow cultures Currently on vanco and zosyn Dc latter and change to ceftriaxone + flagyl Need to look at the wound with vascualr as he has a wound vac  PAD- s/p angioplasty, stents in the past  H/o rt foot gangrene leading to BKA in 2019 with non healing stump leading to AKA in 2021  Transaminitis, thrombocytopenia, has hep b surface antigen positive /cirrhosis- need to check HepB DNA/eantigen, AFP, need to r/o [name]   Anemia  Leucocytosis  Afib- on eliquis/metoprolol  CVA  Rt eye vision loss ? ___________________________________________________ Discussed with his wife

## 2019-11-13 NOTE — Evaluation (Signed)
Physical Therapy Evaluation Patient Details Name: [name]. MRN: [id number] DOB: 1956-07-06 Today's Date: 11/13/2019   History of Present Illness  Pt admitted for sepsis secondary to wound infection and cellulitis. Pt is now s/p I&D of R residual limb on 11/12/19. 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 Other PMH includes expressive aphasia s/p L PCA CVA, Afib, and GERD.  Clinical Impression  Pt is a pleasant 64 year old male who was admitted for sepsis secondary to infection R residual limb wound s/p AKA. Pt unable to perform mobility efforts this date due to severe pain, already had pain meds according to RN. R residual limb painful to touch. Also pt slightly confused this date and refusing to wear hospital gown, prefers to be unclothed. Pt demonstrates deficits with strength/mobility/cognition. Wife present throughout session. Educated on recommendations. Would benefit from skilled PT to address above deficits and promote optimal return to PLOF; recommend transition to STR upon discharge from acute hospitalization.     Follow Up Recommendations SNF(pt and family hopeful for home discharge)    Equipment Recommendations  None recommended by PT    Recommendations for Other Services       Precautions / Restrictions Precautions Precautions: Fall Restrictions Weight Bearing Restrictions: Yes RLE Weight Bearing: Non weight bearing      Mobility  Bed Mobility               General bed mobility comments: unable to perform secondary to pain  Transfers                    Ambulation/Gait                Stairs            Wheelchair Mobility    Modified Rankin (Stroke Patients Only)       Balance                                             Pertinent Vitals/Pain Pain Assessment: Faces Faces Pain Scale: Hurts even more Pain Location: Rt residual limb Pain Descriptors / Indicators:  Aching;Grimacing;Operative site guarding Pain Intervention(s): Limited activity within patient's tolerance    Home Living Family/patient expects to be discharged to:: Private residence Living Arrangements: Spouse/significant other Available Help at Discharge: Family Type of Home: House Home Access: Stairs to enter(per wife, will have ramp installed tomorrow) Entrance Stairs-Rails: Right;Left;Can reach both Entrance Stairs-Number of Steps: 4 Home Layout: One level Home Equipment: Walker - 2 wheels;Bedside commode;Crutches;Wheelchair - manual      Prior Function Level of Independence: Needs assistance   Gait / Transfers Assistance Needed: mostly WC in house, performed modI stand pivot transfers adn scoot transferrs PTA.  ADL's / Homemaking Assistance Needed: Pt was able to perform most BADLs with some supv for safety from spouse to t/f into shower. Wife encourages pt to dress himself, reports helping very occasionally only only on severe pain days or if in a hurry to get to an appointment.  Comments: able to use B UE and feet to self propel WC.     Hand Dominance        Extremity/Trunk Assessment   Upper Extremity Assessment Upper Extremity Assessment: Overall WFL for tasks assessed    Lower Extremity Assessment Lower Extremity Assessment: Generalized weakness(R LE grossly 2/5)  Communication   Communication: No difficulties  Cognition Arousal/Alertness: Awake/alert Behavior During Therapy: WFL for tasks assessed/performed Overall Cognitive Status: Within Functional Limits for tasks assessed                                        General Comments      Exercises Other Exercises Other Exercises: supine ther-ex on B LE including SLRs, hip abd/add and L SAQ. All ther-ex performed x 10 reps with min assist   Assessment/Plan    PT Assessment Patient needs continued PT services  PT Problem List Decreased strength;Decreased activity  tolerance;Decreased balance;Decreased mobility       PT Treatment Interventions DME instruction;Therapeutic exercise;Gait training;Balance training    PT Goals (Current goals can be found in the Care Plan section)  Acute Rehab PT Goals Patient Stated Goal: Get stronger and go home PT Goal Formulation: With patient Time For Goal Achievement: 11/27/19 Potential to Achieve Goals: Good    Frequency Min 2X/week   Barriers to discharge        Co-evaluation               AM-PAC PT "6 Clicks" Mobility  Outcome Measure Help needed turning from your back to your side while in a flat bed without using bedrails?: A Little Help needed moving from lying on your back to sitting on the side of a flat bed without using bedrails?: A Lot Help needed moving to and from a bed to a chair (including a wheelchair)?: A Lot Help needed standing up from a chair using your arms (e.g., wheelchair or bedside chair)?: A Lot Help needed to walk in hospital room?: A Lot Help needed climbing 3-5 steps with a railing? : Total 6 Click Score: 12    End of Session   Activity Tolerance: Patient limited by pain Patient left: in bed;with bed alarm set Nurse Communication: Mobility status PT Visit Diagnosis: Difficulty in walking, not elsewhere classified (R26.2);Other abnormalities of gait and mobility (R26.89);Muscle weakness (generalized) (M62.81)    Time: [id number] PT Time Calculation (min) (ACUTE ONLY): 19 min   Charges:   PT Evaluation $PT Eval Moderate Complexity: 1 Mod PT Treatments $Therapeutic Exercise: 8-22 mins        Greggory Stallion, PT, DPT [phone]   [name] 11/13/2019, 5:18 PM

## 2019-11-14 ENCOUNTER — Inpatient Hospital Stay: Payer: Medicare HMO

## 2019-11-14 DIAGNOSIS — A419 Sepsis, unspecified organism: Secondary | ICD-10-CM

## 2019-11-14 DIAGNOSIS — R410 Disorientation, unspecified: Secondary | ICD-10-CM

## 2019-11-14 DIAGNOSIS — T8149XA Infection following a procedure, other surgical site, initial encounter: Secondary | ICD-10-CM

## 2019-11-14 LAB — GLUCOSE, CAPILLARY
Glucose-Capillary: 236 mg/dL — ABNORMAL HIGH (ref 70–99)
Glucose-Capillary: 151 mg/dL — ABNORMAL HIGH (ref 70–99)
Glucose-Capillary: 152 mg/dL — ABNORMAL HIGH (ref 70–99)
Glucose-Capillary: 128 mg/dL — ABNORMAL HIGH (ref 70–99)
Glucose-Capillary: 150 mg/dL — ABNORMAL HIGH (ref 70–99)

## 2019-11-14 LAB — COMPREHENSIVE METABOLIC PANEL
ALT: 138 U/L — ABNORMAL HIGH (ref 0–44)
AST: 96 U/L — ABNORMAL HIGH (ref 15–41)
Albumin: 2.3 g/dL — ABNORMAL LOW (ref 3.5–5.0)
Alkaline Phosphatase: 181 U/L — ABNORMAL HIGH (ref 38–126)
Anion gap: 8 (ref 5–15)
BUN: 24 mg/dL — ABNORMAL HIGH (ref 8–23)
CO2: 26 mmol/L (ref 22–32)
Calcium: 8 mg/dL — ABNORMAL LOW (ref 8.9–10.3)
Chloride: 102 mmol/L (ref 98–111)
Creatinine, Ser: 0.77 mg/dL (ref 0.61–1.24)
GFR calc Af Amer: >60 mL/min (ref >60)
GFR calc non Af Amer: >60 mL/min (ref >60)
Glucose, Bld: 153 mg/dL — ABNORMAL HIGH (ref 70–99)
Potassium: 3.8 mmol/L (ref 3.5–5.1)
Sodium: 136 mmol/L (ref 135–145)
Total Bilirubin: 1.4 mg/dL — ABNORMAL HIGH (ref 0.3–1.2)
Total Protein: 6.3 g/dL — ABNORMAL LOW (ref 6.5–8.1)

## 2019-11-14 LAB — CBC
HCT: 30.3 % — ABNORMAL LOW (ref 39.0–52.0)
Hemoglobin: 9.2 g/dL — ABNORMAL LOW (ref 13.0–17.0)
MCH: 25.3 pg — ABNORMAL LOW (ref 26.0–34.0)
MCHC: 30.4 g/dL (ref 30.0–36.0)
MCV: 83.5 fL (ref 80.0–100.0)
Platelets: 41 10*3/uL — ABNORMAL LOW (ref 150–400)
RBC: 3.63 MIL/uL — ABNORMAL LOW (ref 4.22–5.81)
RDW: 17.5 % — ABNORMAL HIGH (ref 11.5–15.5)
WBC: 13.6 10*3/uL — ABNORMAL HIGH (ref 4.0–10.5)
nRBC: 4.2 % — ABNORMAL HIGH (ref 0.0–0.2)

## 2019-11-14 IMAGING — MR MR ABDOMEN WO/W CM
21 series · 48 of 48 positions shown · IV contrast (gadavist)
Comparison: No prior abdominal MRI. CT the abdomen and pelvis
11/13/2019.

CLINICAL DATA: 64-year-old male with history of hepatic cirrhosis
and history of hepatitis B infection. Evaluate for potential liver
lesion.

EXAM:
MRI ABDOMEN WITHOUT AND WITH CONTRAST
TECHNIQUE: Multiplanar multisequence MR imaging of the abdomen was performed
both before and after the administration of intravenous contrast.
CONTRAST:  9mL GADAVIST GADOBUTROL 1 MMOL/ML IV SOLN

[Series 2: T2 · coronal · 6.0mm · 1.25mm/px · 2 of 40 slices shown (1 of 3)]
[im 1/40]
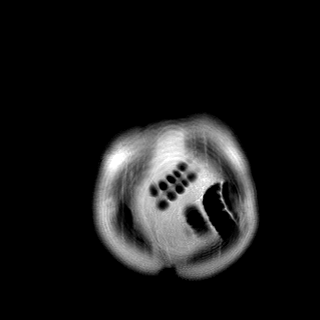
[im 40/40]
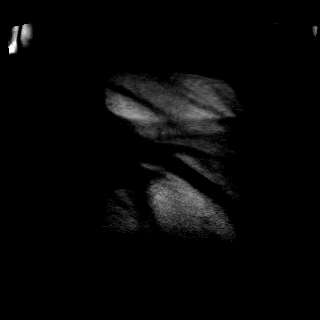

[Series 3: T2 · axial · 6.0mm · 1.25mm/px · 1 of 13 slices shown (2 of 3)]
[im 1/13]
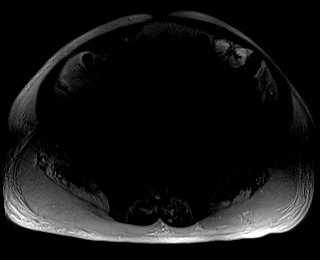

[Series 6: T2 fat-sat · axial · 6.0mm · 1.25mm/px · 1 of 44 slices shown]
[im 1/44]
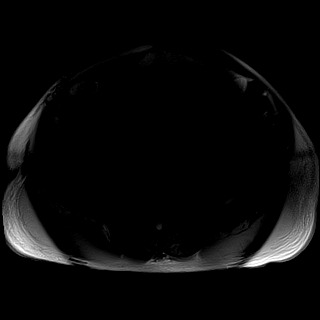

[Series 7: T2 · axial · 6.0mm · 1.25mm/px · 1 of 44 slices shown (3 of 3)]
[im 1/44]
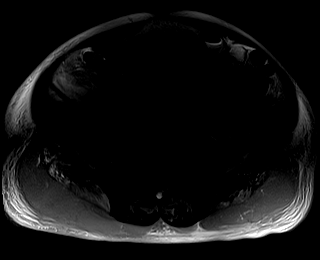

[Series 8: T1 · axial · 6.0mm · 0.78mm/px · z∈[-224,+86]mm · 2 of 88 slices shown (1 of 2)]
[im 1/88]
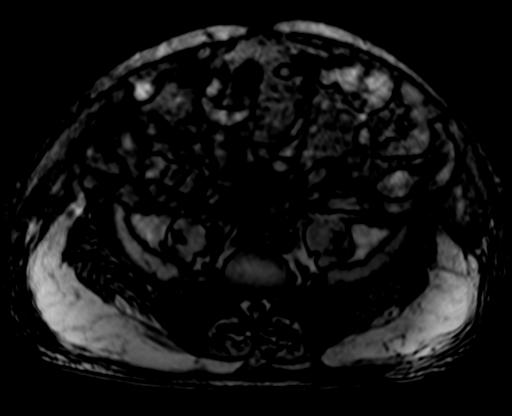
[im 88/88]
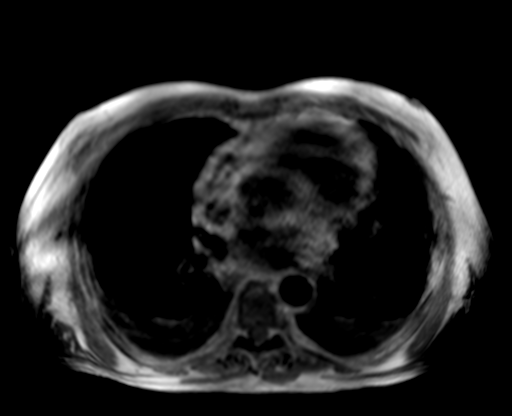

[Series 9: ax dwi_tracew · axial · 6.0mm · 1.49mm/px · z∈[-224,+86]mm · 4 of 132 slices shown]
[im 1/132]
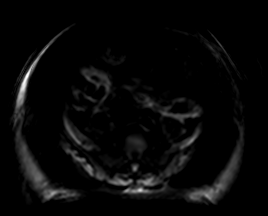
[im 44/132]
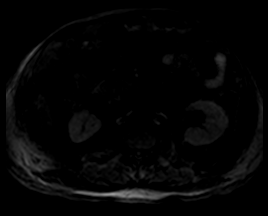
[im 88/132]
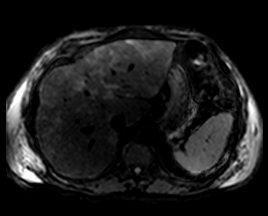
[im 132/132]
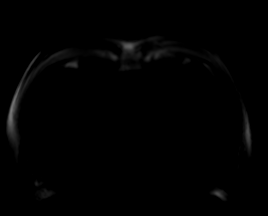

[Series 10: ax dwi_adc · axial · 6.0mm · 1.49mm/px · 1 of 44 slices shown]
[im 1/44]
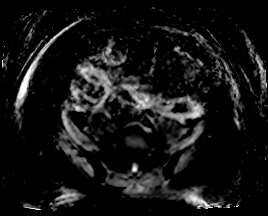

[Series 11: bSSFP · axial · 6.0mm · 0.74mm/px · 1 of 44 slices shown]
[im 1/44]
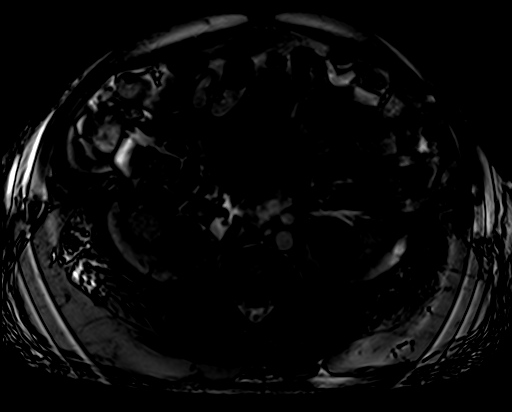

[Series 12: T1 · axial · 6.0mm · 0.78mm/px · z∈[-224,+86]mm · 2 of 88 slices shown (2 of 2)]
[im 1/88]
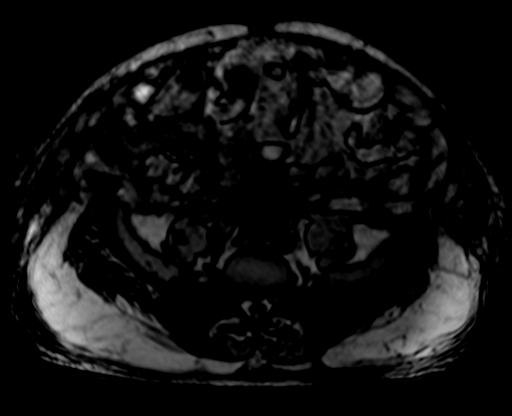
[im 88/88]
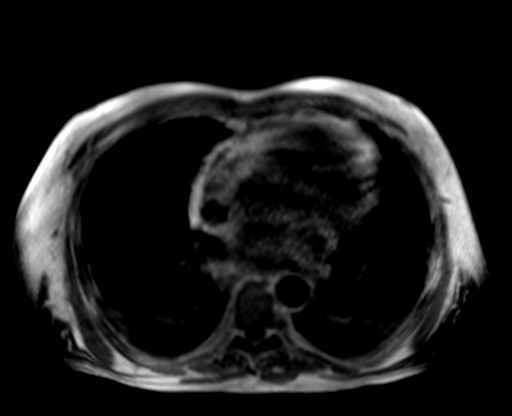

[Series 13: T1 fat-sat · axial · 4.0mm · 1.56mm/px · z∈[-227,+89]mm · 2 of 80 slices shown (1 of 2)]
[im 1/80]
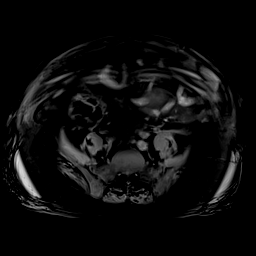
[im 80/80]
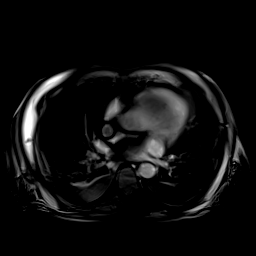

[Series 14: T1 dynamic post-contrast · coronal · 4.0mm · 1.44mm/px · 2 of 72 slices shown (1 of 9)]
[im 1/72]
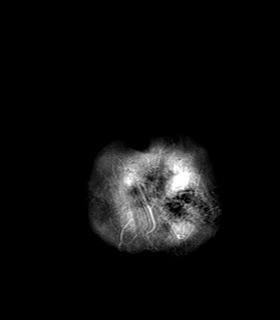
[im 72/72]
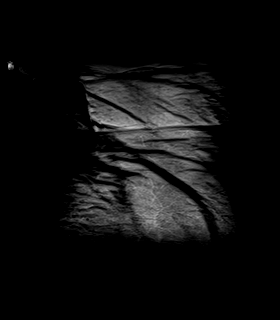

[Series 15: T1 fat-sat · axial · 4.0mm · 1.56mm/px · z∈[-227,+89]mm · 11 of 400 slices shown (2 of 2)]
[im 1/400]
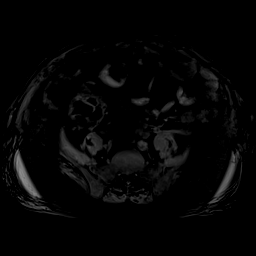
[im 40/400]
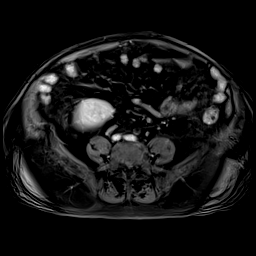
[im 80/400]
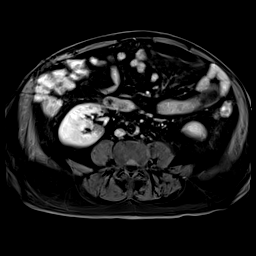
[im 120/400]
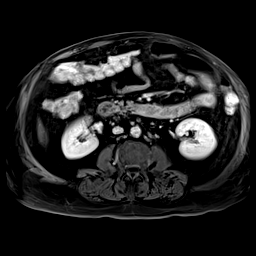
[im 160/400]
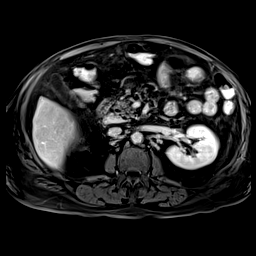
[im 200/400]
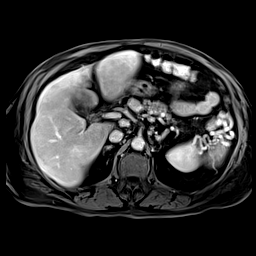
[im 240/400]
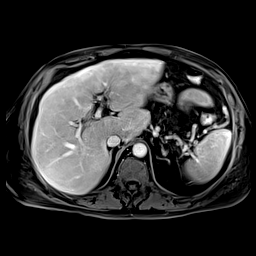
[im 280/400]
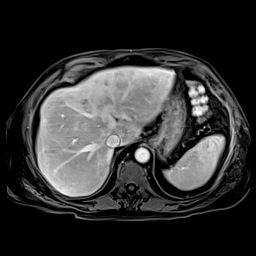
[im 320/400]
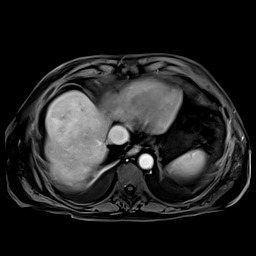
[im 360/400]
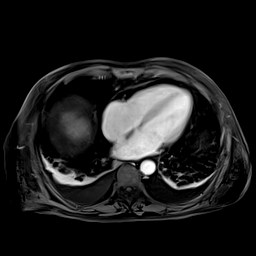
[im 400/400]
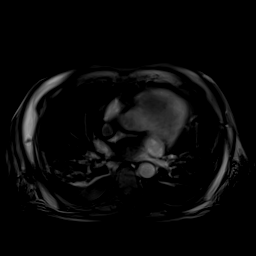

[Series 1003: T1 dynamic · axial · non-contrast · 4.0mm · 1.56mm/px · z∈[-227,+89]mm · 2 of 80 slices shown]
[im 1/80]
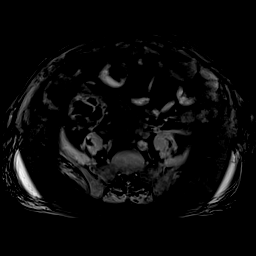
[im 80/80]
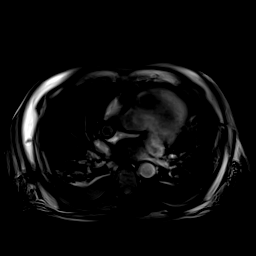

[Series 1008: T1 dynamic post-contrast · axial · 4.0mm · 1.56mm/px · z∈[-227,+89]mm · 2 of 80 slices shown (2 of 9)]
[im 1/80]
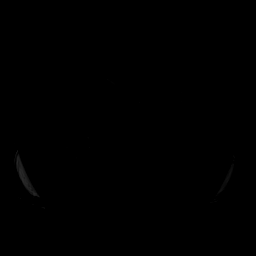
[im 80/80]
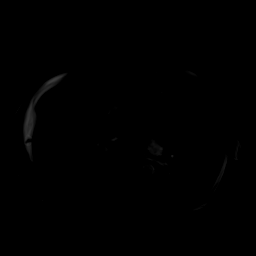

[Series 1015: T1 dynamic post-contrast · axial · 4.0mm · 1.56mm/px · z∈[-227,+89]mm · 2 of 80 slices shown (3 of 9)]
[im 1/80]
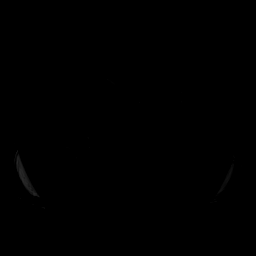
[im 80/80]
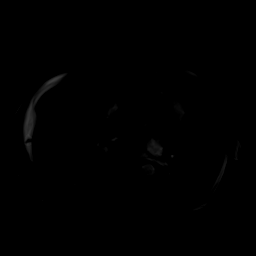

[Series 1022: T1 dynamic post-contrast · axial · 4.0mm · 1.56mm/px · z∈[-227,+89]mm · 2 of 80 slices shown (4 of 9)]
[im 1/80]
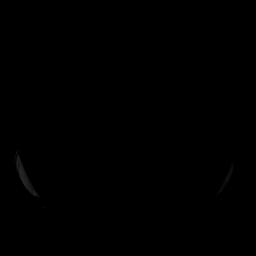
[im 80/80]
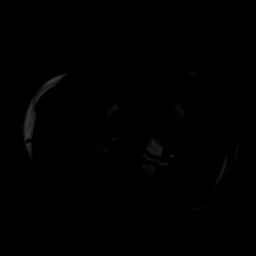

[Series 1029: T1 dynamic post-contrast · axial · 4.0mm · 1.56mm/px · z∈[-227,+89]mm · 2 of 80 slices shown (5 of 9)]
[im 1/80]
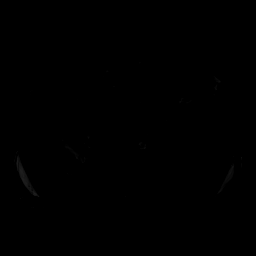
[im 80/80]
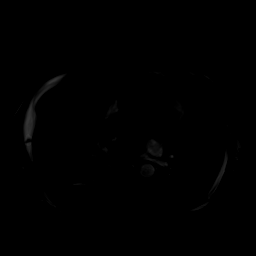

[Series 1032: T1 dynamic post-contrast · axial · 4.0mm · 1.56mm/px · z∈[-227,+89]mm · 2 of 80 slices shown (6 of 9)]
[im 1/80]
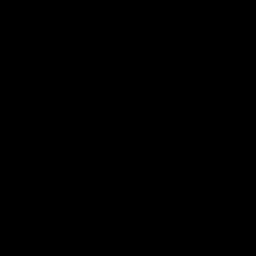
[im 80/80]
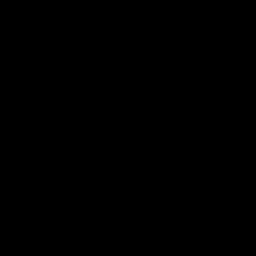

[Series 1036: T1 dynamic post-contrast · axial · 4.0mm · 1.56mm/px · z∈[-227,+89]mm · 2 of 80 slices shown (7 of 9)]
[im 1/80]
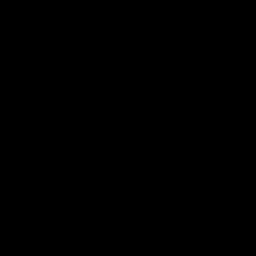
[im 80/80]
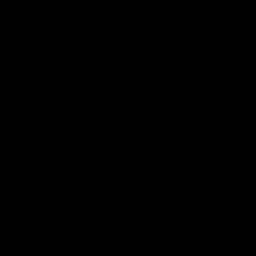

[Series 1040: T1 dynamic post-contrast · axial · 4.0mm · 1.56mm/px · z∈[-227,+89]mm · 2 of 80 slices shown (8 of 9)]
[im 1/80]
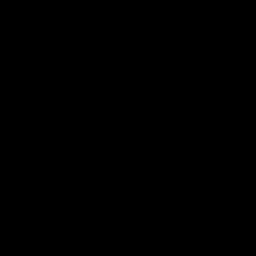
[im 80/80]
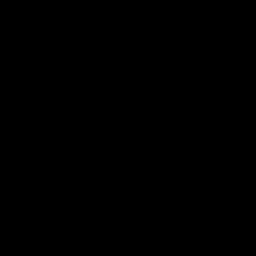

[Series 1044: T1 dynamic post-contrast · axial · 4.0mm · 1.56mm/px · z∈[-227,+89]mm · 2 of 80 slices shown (9 of 9)]
[im 1/80]
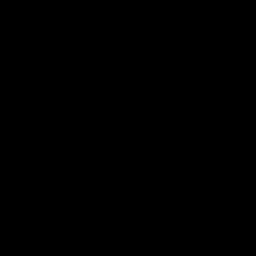
[im 80/80]
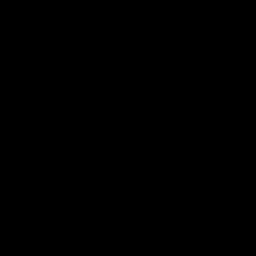

[48 of 48 positions shown; findings below may reference images not displayed]

FINDINGS: Comment: Portions of today's study is significantly limited by
patient respiratory motion.

Lower chest: Small bilateral pleural effusions lying dependently.
Signal intensity in the dependent portions of the lower lobes of the
lungs bilaterally, likely to reflect areas of subsegmental
atelectasis.

Hepatobiliary: Liver has a slightly nodular contour, indicative of
early changes of cirrhosis. On pre gadolinium T2 weighted images
there are several somewhat ill-defined areas of mild T2
hyperenhancement scattered throughout the periphery of the hepatic
parenchyma, largest of which is between segments 4A and 4B (axial
image 17 of series 6) measuring 4.0 x 1.8 cm. These lesions have no
definite correlate on pre gadolinium T1 weighted images. However,
some of these lesions demonstrate mild hyperintensity on
diffusion-weighted sequences. Post gadolinium imaging demonstrates
heterogeneous hyper perfusion throughout much of the periphery of
the liver, without a discrete well-defined hypervascular mass
demonstrating central washout and enhancing capsule. No intra or
extrahepatic biliary ductal dilatation. Multiple filling defects are
noted in the dependent portion of the gallbladder measuring up to 9
mm, compatible with gallstones. No gallbladder wall thickening or
gallbladder distention to suggest an acute cholecystitis at this
time. There is a trace amount of pericholecystic fluid, however,
this appears to be related to a small volume of ascites adjacent to
the liver.

Pancreas: No pancreatic mass. No pancreatic ductal dilatation. No
pancreatic or peripancreatic fluid collections or inflammatory
changes.

Spleen:  Unremarkable.

Adrenals/Urinary Tract: Subcentimeter T1 hypointense, T2
hyperintense, nonenhancing lesion in the lower pole of the right
kidney is compatible with a simple cyst. Left kidney and bilateral
adrenal glands are normal in appearance. No hydroureteronephrosis in
the visualized portions of the abdomen.

Stomach/Bowel: Visualized portions are unremarkable.

Vascular/Lymphatic: Signal void in the distal abdominal aorta and
proximal iliac vessels related to aorto bi-iliac stent. Enlarged
lymph nodes in the hepatic duodenal ligament measuring up to 1.7 cm
in short axis (axial image 41 of series 13) and in the portacaval
nodal station measuring 1.3 cm in short axis (axial image 48 of
series 13).

Other:  Trace volume of ascites.

Musculoskeletal: No aggressive appearing osseous lesions are noted
in the visualized portions of the skeleton.
IMPRESSION: 1. Morphologic changes in the liver compatible with underlying
cirrhosis. There are multiple ill-defined areas in the periphery of
the liver which demonstrate heterogeneous signal intensity and
perfusion characteristics, as discussed above. The largest of these
areas measures up to 4.0 x 1.8 cm between segments 4A and 4B and is
categorized as ETOIL 3 based on today's findings. Repeat abdominal
MRI with and without IV gadolinium is recommended in 6 months to
ensure the stability of these findings.
2. Trace volume of ascites.
3. Small bilateral pleural effusions lying dependently.
4. Cholelithiasis.

## 2019-11-14 MED ORDER — HYDROMORPHONE HCL 1 MG/ML IJ SOLN
1.0000 mg | Freq: Once | INTRAMUSCULAR | Status: AC
  Administered 2019-11-14: 1 mg via INTRAVENOUS
  Filled 2019-11-14: qty 1

## 2019-11-14 MED ORDER — GADOBUTROL 1 MMOL/ML IV SOLN
9.0000 mL | Freq: Once | INTRAVENOUS | Status: AC | PRN
  Administered 2019-11-15: 9 mL via INTRAVENOUS

## 2019-11-14 MED ORDER — LACTULOSE 10 GM/15ML PO SOLN: 30.0000 g | Freq: Two times a day (BID) | ORAL | Status: DC | PRN

## 2019-11-14 MED ORDER — FUROSEMIDE 10 MG/ML IJ SOLN
40.0000 mg | Freq: Every day | INTRAMUSCULAR | Status: DC
  Administered 2019-11-14 – 2019-11-16 (×3): 40 mg via INTRAVENOUS
  Filled 2019-11-14 (×3): qty 4

## 2019-11-14 NOTE — Progress Notes (Signed)
Pharmacy Antibiotic Note  Glenn Febres. is a 64 y.o. male admitted on 11/10/2019 with foul and purulence from AKA site.  Pharmacy has been consulted for vancomycin dosing. Pt currently also on ceftriaxone and Flagyl.   Plan: Patient received vanc 2g IV load in ED   Continue Vancomycin 1250 mg IV Q 12 hrs. Goal AUC 400-550. Expected AUC: 454 SCr used: 0.80 Cssmin: 12.6  Will continue to monitor renal function and adjust dose as needed.  Height: 5\' 9"  (175.3 cm) Weight: 94.7 kg (208 lb 11.2 oz) IBW/kg (Calculated) : 70.7  Temp (24hrs), Avg:98.3 F (36.8 C), Min:97.6 F (36.4 C), Max:99.1 F (37.3 C)  Recent Labs  Lab 11/10/19 2357 11/11/19 0352 11/12/19 0855 11/13/19 0349 11/14/19 0356  WBC 12.8* 14.1* 14.1* 18.9* 13.6*  CREATININE 0.83 0.75 0.84 0.89 0.77  LATICACIDVEN 1.7 1.5  --   --   --     Estimated Creatinine Clearance: 104.6 mL/min (by C-G formula based on SCr of 0.77 mg/dL).    Allergies  Allergen Reactions  . Sodium Pentobarbital [Pentobarbital] Shortness Of Breath  . Lipitor [Atorvastatin] Rash    Vanc 4/10 >> Zosyn 4/10 >>4/12 CTX 4/10, 4/12 >> Flagyl 4/12 >>  4/10 tissues cx reincubated  4/9 BCx x2 NGTD 4/9 WCx multiple organisms UCx multiple spp  Thank you for allowing pharmacy to be a part of this patient's care.  Rayna Sexton, PharmD, BCPS Clinical Pharmacist 11/14/2019 1:25 PM

## 2019-11-14 NOTE — Care Management Important Message (Signed)
Important Message  Patient Details  Name: [name]. MRN: [id number] Date of Birth: 03/18/1956   Medicare Important Message Given:  Yes     Dannette Barbara 11/14/2019, 11:38 AM

## 2019-11-14 NOTE — Consult Note (Signed)
[facility] Nurse Consult Note: Reason for Consult: first post op NPWT dressing Wound type: surgical s/p debridement Pressure Injury POA: NA Measurement: 16cm x 9cm x 4cm  Wound bed: beefy red; some darkening of the tissues along the posterior portion of the surgical wound  Drainage (amount, consistency, odor) serosanguineous in the VAC canister Periwound: intact, some edema Dressing procedure/placement/frequency: PA removed old NPWT dressing Periwound skin protected with skin barrier wipe  Filled wound with  _1__ piece of black foam Sealed NPWT dressing at 124mm HG  Patient received IV pain medication per bedside nurse prior to dressing change Patient tolerated procedure well  Discussed frequency with PA at the bedside. Will plan next dressing change for Friday 11/17/19  [facility] nurse will continue to provide NPWT dressing changed due to the complexity of the dressing change.  [place], [place], [place]

## 2019-11-14 NOTE — Progress Notes (Signed)
OT Cancellation Note  Patient Details Name: [name]. MRN: [id number] DOB: November 09, 1955   Cancelled Treatment:    Reason Eval/Treat Not Completed: Patient at procedure or test/ unavailable. Thank you for the OT consult. Order received and chart reviewed. Upon arrival to pt room, pt with caregiver and hospital staff at bedside. Will hold OT evaluation at this time and re-attempt at a later date/time as available and pt medically appropriate for OT eval.   Shara Blazing, M.S., OTR/L Ascom: [phone] 11/14/19, 11:16 AM

## 2019-11-14 NOTE — Progress Notes (Signed)
PROGRESS NOTE    Glenn B Shakespeare Jr.  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 Continue on IV vanco & ceftriaxine. Blood cxs NGTD. Right leg culture growing gram positive rods & gram positive cocci in clusters. Urine cx growing multiple species, likely contaminate. S/p I&D of right AKA stump, skin & soft tissue 11/11/19 as per vascular surg. Continue w/ wound care. ID following and recs apprec   Sepsis: secondary to above. Management as stated above   Atrial fibrillation: possibly PAF.  Continue on home dose of metoprolol. Hold metoprolol for HR<65. Continue to hold eliquis  Thrombocytopenia:  labile. Etiology unclear, medication ADR (aspirin, eliquis) vs infection  vs  cirrhosis. Pathologist smear WNL.  PTT, fibrinogen, d-dimer, PT, LDH all elevated, haptoglobin WNL. Will hold home dose of aspirin & eliquis   Cirrhosis: w/ ascites & likely liver lesion as per CTA. CT abd/pelvis shows continued cirrhosis w/ lymphadenopathy, recs MRI to assess possible liver cancer. MRI abd/pelvis ordered. May need a paracentesis soon. Started on IV lasix today. HBV surface antigen positive, likely acute hepatitis. Hepatitis B profile pending. AFP pending. Will hold home dose of aspirin & eliquis   Hyperbilirubinemia: likely secondary to cirrhosis. Will continue to monitor   Normocytic anemia: no need for a transfusion at this time. Possibly secondary to cirrhosis. Will continue to monitor   Leukocytosis: likely secondary to infection. Continue on IV abxs   Transaminitis: hepatitis panel shows positive HBV surface antigen. Likely cirrhosis on CT abd/pelvis. HBV, AFP profile pending. RUQ US shows cholelithiasis w/ trace free pericholecystic fluid, no other evidence to acute cholecystitis   Metabolic acidosis:  resolved  Hyponatremia: resolved  DM2: will continue on SSI w/ accuchecks. Will continue to hold jardiance and metformin   Dyslipidemia: continue to hold vytorin secondary to transaminitis  Generalized weakness: PT recs SNF   DVT prophylaxis: none as thrombocytopenia, cirrhosis, increased INR; SCDs on LLE only  Code Status: full  Family Communication: discussed pt's care w/ pt's wife who was at bedside Disposition Plan:  Will d/c to SNF    Consultants:   Vascular surgery    Procedures:    Antimicrobials: ceftriaxone, vanco   Subjective: Pt c/o pain all over his body.  Objective: Vitals:   11/13/19 1255 11/13/19 1812 11/13/19 1952 11/14/19 0439  BP: 139/87 116/76 134/81 130/85  Pulse: 89 (!) 108 100 (!) 103  Resp: 18 20    Temp: 97.6 F (36.4 C) 98.2 F (36.8 C) 97.6 F (36.4 C) 99.1 F (37.3 C)  TempSrc: Oral Oral Oral Oral  SpO2: 95% 94% 95% 96%  Weight:    94.7 kg  Height:        Intake/Output Summary (Last 24 hours) at 11/14/2019 0759 Last data filed at 11/14/2019 0400 Gross per 24 hour  Intake 2109.19 ml  Output 0 ml  Net 2109.19 ml   Filed Weights   11/10/19 2350 11/13/19 0416 11/14/19 0439  Weight: 88.5 kg 93.4 kg 94.7 kg    Examination:  General exam: Appears uncomfortable  Respiratory system: diminished breath sounds b/l. No wheezes Cardiovascular system: irregularly irregular. No rubs, gallops or clicks.  Gastrointestinal system: Abdomen is distended, soft and tenderness to palpation. hypoactive bowel sounds heard. Central nervous system: Alert and oriented to self only  Psychiatry: Judgement and insight appear abnormal.  Agitated & frustrated     Data Reviewed: I have personally reviewed following labs and imaging studies  CBC: Recent Labs  Lab 11/10/19 2357 11/11/19 0352 11/12/19 0855 11/13/19 0349 11/14/19 0356  WBC 12.8* 14.1* 14.1* 18.9* 13.6*  NEUTROABS 10.1*  --   --   --   --   HGB 9.5* 8.9* 8.5* 8.6* 9.2*  HCT 30.0*  27.5* 27.2* 28.6* 30.3*  MCV 80.4 80.6 81.2 85.1 83.5  PLT 91* 76* 144* 116* 41*   Basic Metabolic Panel: Recent Labs  Lab 11/10/19 2357 11/11/19 0352 11/12/19 0855 11/13/19 0349 11/14/19 0356  NA 130* 129* 129* 131* 136  K 4.0 4.2 4.1 4.8 3.8  CL 95* 98 96* 98 102  CO2 22 20* 22 23 26   GLUCOSE 145* 204* 309* 194* 153*  BUN 20 21 35* 33* 24*  CREATININE 0.83 0.75 0.84 0.89 0.77  CALCIUM 8.2* 7.7* 7.8* 7.9* 8.0*   GFR: Estimated Creatinine Clearance: 104.6 mL/min (by C-G formula based on SCr of 0.77 mg/dL). Liver Function Tests: Recent Labs  Lab 11/10/19 2357 11/13/19 0349 11/14/19 0356  AST 359* 201* 96*  ALT 280* 195* 138*  ALKPHOS 190* 186* 181*  BILITOT 2.2* 1.8* 1.4*  PROT 7.2 6.3* 6.3*  ALBUMIN 2.5* 2.3* 2.3*   Recent Labs  Lab 11/10/19 2357  LIPASE 16   No results for input(s): AMMONIA in the last 168 hours. Coagulation Profile: Recent Labs  Lab 11/10/19 2357 11/13/19 0723  INR 1.9* 2.8*   Cardiac Enzymes: No results for input(s): CKTOTAL, CKMB, CKMBINDEX, TROPONINI in the last 168 hours. BNP (last 3 results) No results for input(s): PROBNP in the last 8760 hours. HbA1C: No results for input(s): HGBA1C in the last 72 hours. CBG: Recent Labs  Lab 11/12/19 2104 11/13/19 0738 11/13/19 1203 11/13/19 1752 11/13/19 2051  GLUCAP 198* 175* 203* 198* 187*   Lipid Profile: No results for input(s): CHOL, HDL, LDLCALC, TRIG, CHOLHDL, LDLDIRECT in the last 72 hours. Thyroid Function Tests: No results for input(s): TSH, T4TOTAL, FREET4, T3FREE, THYROIDAB in the last 72 hours. Anemia Panel: No results for input(s): VITAMINB12, FOLATE, FERRITIN, TIBC, IRON, RETICCTPCT in the last 72 hours. Sepsis Labs: Recent Labs  Lab 11/10/19 2357 11/11/19 0352  PROCALCITON 1.84  --   LATICACIDVEN 1.7 1.5    Recent Results (from the past 240 hour(s))  Blood Culture (routine x 2)     Status: None (Preliminary result)   Collection Time: 11/10/19 11:57 PM    Specimen: BLOOD  Result Value Ref Range Status   Specimen Description BLOOD BLOOD LEFT HAND  Final   Special Requests   Final    BOTTLES DRAWN AEROBIC AND ANAEROBIC Blood Culture results may not be optimal due to an excessive volume of blood received in culture bottles   Culture   Final    NO GROWTH 3 DAYS Performed at [facility], [address] [place], [place] [address]    Report Status PENDING  Incomplete  Blood Culture (routine x 2)     Status: None (Preliminary result)   Collection Time: 11/10/19 11:57 PM   Specimen: BLOOD  Result Value Ref Range Status   Specimen Description BLOOD BLOOD RIGHT HAND  Final   Special Requests   Final    BOTTLES DRAWN AEROBIC AND ANAEROBIC Blood Culture results may not be optimal due to an inadequate volume of blood received in culture bottles   Culture   Final    NO GROWTH 3 DAYS Performed  at [facility], [address], [place], [place] [address]    Report Status PENDING  Incomplete  Urine culture     Status: Abnormal   Collection Time: 11/10/19 11:57 PM   Specimen: In/Out Cath Urine  Result Value Ref Range Status   Specimen Description   Final    IN/OUT CATH URINE Performed at [facility], [address], [place], [place] [address]    Special Requests   Final    NONE Performed at [facility], [address] [place], [place] [address]    Culture MULTIPLE SPECIES PRESENT, SUGGEST RECOLLECTION (A)  Final   Report Status 11/12/2019 FINAL  Final  Aerobic/Anaerobic Culture (surgical/deep wound)     Status: Abnormal (Preliminary result)   Collection Time: 11/10/19 11:57 PM   Specimen: Leg; Wound  Result Value Ref Range Status   Specimen Description   Final    LEG RIGHT Performed at [facility], [address] [place], [place] [address]    Special Requests   Final    Normal Performed at [facility], [address] [place], [place] [address]    Gram Stain   Final    NO  WBC SEEN MODERATE GRAM POSITIVE RODS Performed at [facility], [place] [address] [place], [place] [address]    Culture MULTIPLE ORGANISMS PRESENT, NONE PREDOMINANT (A)  Final   Report Status PENDING  Incomplete  Respiratory Panel by RT PCR (Flu A&B, Covid) - Nasopharyngeal Swab     Status: None   Collection Time: 11/11/19  1:46 AM   Specimen: Nasopharyngeal Swab  Result Value Ref Range Status   SARS Coronavirus 2 by RT PCR NEGATIVE NEGATIVE Final    Comment: (NOTE) SARS-CoV-2 target nucleic acids are NOT DETECTED. The SARS-CoV-2 RNA is generally detectable in upper respiratoy specimens during the acute phase of infection. The lowest concentration of SARS-CoV-2 viral copies this assay can detect is 131 copies/mL. A negative result does not preclude SARS-Cov-2 infection and should not be used as the sole basis for treatment or other patient management decisions. A negative result may occur with  improper specimen collection/handling, submission of specimen other than nasopharyngeal swab, presence of viral mutation(s) within the areas targeted by this assay, and inadequate number of viral copies (<131 copies/mL). A negative result must be combined with clinical observations, patient history, and epidemiological information. The expected result is Negative. Fact Sheet for Patients:  PinkCheek.be Fact Sheet for Healthcare Providers:  GravelBags.it This test is not yet ap proved or cleared by the [place] FDA and  has been authorized for detection and/or diagnosis of SARS-CoV-2 by FDA under an Emergency Use Authorization (EUA). This EUA will remain  in effect (meaning this test can be used) for the duration of the COVID-19 declaration under Section 564(b)(1) of the Act, 21 U.S.C. section 360bbb-3(b)(1), unless the authorization is terminated or revoked sooner.    Influenza A by PCR NEGATIVE NEGATIVE Final   Influenza B  by PCR NEGATIVE NEGATIVE Final    Comment: (NOTE) The Xpert Xpress SARS-CoV-2/FLU/RSV assay is intended as an aid in  the diagnosis of influenza from Nasopharyngeal swab specimens and  should not be used as a sole basis for treatment. Nasal washings and  aspirates are unacceptable for Xpert Xpress SARS-CoV-2/FLU/RSV  testing. Fact Sheet for Patients: PinkCheek.be Fact Sheet for Healthcare Providers: GravelBags.it This test is not yet approved or cleared by the [place] FDA and  has been authorized for detection  and/or diagnosis of SARS-CoV-2 by  FDA under an Emergency Use Authorization (EUA). This EUA will remain  in effect (meaning this test can be used) for the duration of the  Covid-19 declaration under Section 564(b)(1) of the Act, 21  U.S.C. section 360bbb-3(b)(1), unless the authorization is  terminated or revoked. Performed at [facility], [address] [place], [place] [address]   Aerobic/Anaerobic Culture (surgical/deep wound)     Status: None (Preliminary result)   Collection Time: 11/11/19  4:44 PM   Specimen: PATH Amputaion Arm/Leg; Tissue  Result Value Ref Range Status   Specimen Description   Final    WOUND Performed at [facility], [address] [place], [place] [address]    Special Requests   Final    NONE Performed at [facility], [address] [place], [place] [address]    Gram Stain   Final    FEW WBC PRESENT, PREDOMINANTLY PMN FEW GRAM POSITIVE COCCI IN CLUSTERS RARE GRAM NEGATIVE RODS    Culture   Final    CULTURE REINCUBATED FOR BETTER GROWTH Performed at [facility], [place] [address], [place], [place] [address]    Report Status PENDING  Incomplete         Radiology Studies: CT ANGIO CHEST PE W OR WO CONTRAST  Result Date: 11/12/2019 CLINICAL DATA:  Elevated D-dimer. Recent debridement above knee amputation stump. EXAM: CT ANGIOGRAPHY  CHEST WITH CONTRAST TECHNIQUE: Multidetector CT imaging of the chest was performed using the standard protocol during bolus administration of intravenous contrast. Multiplanar CT image reconstructions and MIPs were obtained to evaluate the vascular anatomy. CONTRAST:  74mL OMNIPAQUE IOHEXOL 350 MG/ML SOLN COMPARISON:  Chest CT 08/12/2018 FINDINGS: Cardiovascular: No filling defects within the pulmonary arteries to suggest acute pulmonary embolism. Coronary artery calcification and aortic atherosclerotic calcification. Mediastinum/Nodes: No axillary supraclavicular adenopathy. Mildly enlarged hilar nodes measure up to 10 mm short axis. Subcarinal node and RIGHT lower paratracheal nodes measures 11 mm. These nodes are slightly increased from comparison CT. No pericardial effusion. Calcified pericardium. Lungs/Pleura: Bilateral pleural effusions. No focal consolidation. Linear scarring in the LEFT upper lobe. Upper Abdomen: Gallstones present. Normal adrenal glands. Liver has a nodular contour. Small ascites surrounds the liver. Ill-defined hypodensities in the dome of the liver (image 81/4. Musculoskeletal: No aggressive osseous lesion. Review of the MIP images confirms the above findings. IMPRESSION: 1. No evidence acute pulmonary embolism. 2. Mild mediastinal hilar adenopathy is favored reactive. 3. Thin calcifications of the pericardium. 4. Nodular liver with ascites consistent with cirrhosis. 5. Cholelithiasis. 6. Ill-defined hypodensity in the dome liver. Consider abdominal pelvis CT with contrast to exclude hepatic lesion in patient with cirrhosis. Electronically Signed   By: Suzy Bouchard M.D.   On: 11/12/2019 10:08   CT ABDOMEN PELVIS W CONTRAST  Result Date: 11/13/2019 CLINICAL DATA:  Cirrhosis, hepatitis B positive, abnormal chest CT EXAM: CT ABDOMEN AND PELVIS WITH CONTRAST TECHNIQUE: Multidetector CT imaging of the abdomen and pelvis was performed using the standard protocol following bolus  administration of intravenous contrast. CONTRAST:  158mL OMNIPAQUE IOHEXOL 300 MG/ML  SOLN COMPARISON:  11/12/2019 FINDINGS: Lower chest: There are small bilateral pleural effusions with scattered areas of dependent lower lobe atelectasis. Pericardial calcifications are noted. Hepatobiliary: The liver is diffusely heterogeneous, particular involving the left lobe and superior aspect right lobe liver in the region identified on recent chest CT. There is decreased heterogeneity on delayed imaging through the liver. However, in a patient with cirrhosis and  known history of hepatitis, MRI is more sensitive for detection of hepatic neoplasm. There is no biliary dilation. Multiple calcified gallstones are seen without cholecystitis. Pancreas: Mild atrophy of the pancreatic head. Otherwise the pancreas enhances normally. Spleen: Normal in size without focal abnormality. Adrenals/Urinary Tract: The kidneys enhance normally and symmetrically. No urinary tract calculi or obstructive uropathy. High attenuation within the bladder lumen likely reflects previously excreted contrast from CT performed yesterday. The adrenals are unremarkable. Stomach/Bowel: No bowel obstruction or ileus. The appendix is surgically absent. Diverticulosis of the sigmoid colon without diverticulitis. Vascular/Lymphatic: There are stents within the bilateral common iliac arteries. Moderate atherosclerosis within the abdominal aorta. Portacaval adenopathy is seen, index lymph node image 30 measuring 16 mm in short axis. Multiple subcentimeter retroperitoneal lymph nodes are nonspecific. Reproductive: Prostate is unremarkable. Other: There is trace abdominal ascites. Diffuse subcutaneous edema is seen involving the bilateral flanks, lower abdominal wall, and pelvis. No free intra-abdominal gas.  Fat containing right inguinal hernia. Musculoskeletal: No acute or destructive bony lesions. Reconstructed images demonstrate no additional findings. IMPRESSION:  1. Continued findings of cirrhosis, with diffuse parenchymal heterogeneity on portal venous imaging. This becomes less heterogeneous on delayed imaging. If underlying hepatic neoplasm remain suspected, MRI is a more sensitive evaluation. 2. Nonspecific portacaval lymphadenopathy. 3. Small bilateral pleural effusions, trace ascites, and diffuse subcutaneous edema. Findings could be related to hypoproteinemic state. 4. Cholelithiasis without cholecystitis. 5. Diverticulosis without diverticulitis. 6. Fat containing right inguinal hernia. Electronically Signed   By: Randa Ngo M.D.   On: 11/13/2019 17:16   ECHOCARDIOGRAM COMPLETE  Result Date: 11/12/2019    ECHOCARDIOGRAM REPORT   Patient Name:   [name]. Date of Exam: 11/12/2019 Medical Rec #:  [id number]           Height:       69.0 in Accession #:    [id number]          Weight:       195.1 lb Date of Birth:  29-Aug-1955           BSA:          2.044 m Patient Age:    64 years            BP:           119/67 mmHg Patient Gender: M                   HR:           80 bpm. Exam Location:  [facility] Procedure: 2D Echo Indications:     ATRIAL FIBRILLATION 437.31/ I48.91  History:         Patient has prior history of Echocardiogram examinations. Risk                  Factors:Dyslipidemia, Current Smoker, Diabetes and                  Hypertension.  Sonographer:     Avanell Shackleton Referring Phys:  [id number] [place] Diagnosing Phys: Kate Sable MD IMPRESSIONS  1. Left ventricular ejection fraction, by estimation, is 50 to 55%. The left ventricle has low normal function. The left ventricle has no regional wall motion abnormalities. Left ventricular diastolic parameters are indeterminate.  2. Right ventricular systolic function is normal. The right ventricular size is normal.  3. Left atrial size was mildly dilated.  4. The mitral valve is normal in structure. Trivial mitral valve regurgitation.  5. The aortic  valve is normal in structure. Aortic valve  regurgitation is not visualized.  6. The inferior vena cava is dilated in size with <50% respiratory variability, suggesting right atrial pressure of 15 mmHg. FINDINGS  Left Ventricle: Left ventricular ejection fraction, by estimation, is 50 to 55%. The left ventricle has low normal function. The left ventricle has no regional wall motion abnormalities. The left ventricular internal cavity size was normal in size. There is no left ventricular hypertrophy. Left ventricular diastolic parameters are indeterminate. Right Ventricle: The right ventricular size is normal. Right vetricular wall thickness was not assessed. Right ventricular systolic function is normal. Left Atrium: Left atrial size was mildly dilated. Right Atrium: Right atrial size was normal in size. Pericardium: There is no evidence of pericardial effusion. Mitral Valve: The mitral valve is normal in structure. Trivial mitral valve regurgitation. Tricuspid Valve: The tricuspid valve is normal in structure. Tricuspid valve regurgitation is not demonstrated. Aortic Valve: The aortic valve is normal in structure. Aortic valve regurgitation is not visualized. Pulmonic Valve: The pulmonic valve was normal in structure. Pulmonic valve regurgitation is not visualized. Aorta: The aortic root is normal in size and structure. Venous: The inferior vena cava is dilated in size with less than 50% respiratory variability, suggesting right atrial pressure of 15 mmHg. IAS/Shunts: No atrial level shunt detected by color flow Doppler.  LEFT VENTRICLE PLAX 2D LVIDd:         4.83 cm LVIDs:         3.52 cm LV PW:         0.93 cm LV IVS:        0.85 cm LVOT diam:     2.30 cm LVOT Area:     4.15 cm  RIGHT VENTRICLE RV Mid diam:    2.99 cm LEFT ATRIUM              Index LA diam:        4.60 cm  2.25 cm/m LA Vol (A2C):   106.0 ml 51.85 ml/m LA Vol (A4C):   62.3 ml  30.47 ml/m LA Biplane Vol: 82.3 ml  40.26 ml/m   AORTA Ao Root diam: 4.00 cm  SHUNTS Systemic Diam: 2.30 cm  Kate Sable MD Electronically signed by Kate Sable MD Signature Date/Time: 11/12/2019/1:57:15 PM    Final         Scheduled Meds: . vitamin C  500 mg Oral Daily  . cholecalciferol  5,000 Units Oral Daily  . docusate sodium  100 mg Oral Daily  . ferrous sulfate  325 mg Oral BID WC  . gabapentin  1,200 mg Oral TID  . insulin aspart  0-15 Units Subcutaneous TID PC & HS  . metoprolol tartrate  25 mg Oral BID  . multivitamin with minerals  1 tablet Oral Daily  . nystatin   Topical BID  . pantoprazole  20 mg Oral Daily  . pneumococcal 23 valent vaccine  0.5 mL Intramuscular Tomorrow-1000   Continuous Infusions: . sodium chloride 10 mL/hr at 11/13/19 1510  . sodium chloride 50 mL/hr at 11/13/19 1947  . cefTRIAXone (ROCEPHIN)  IV 2 g (11/13/19 2302)  . metronidazole 500 mg (11/14/19 0523)  . vancomycin 1,250 mg (11/14/19 0221)     LOS: 3 days    Time spent: 35 mins     Wyvonnia Dusky, MD [facility] Pager [phone]  If 7PM-7AM, please contact night-coverage [email or web address] 11/14/2019, 7:59 AM

## 2019-11-14 NOTE — Progress Notes (Signed)
OT Cancellation Note  Patient Details Name: [name]. MRN: [id number] DOB: 31-Jan-1956   Cancelled Treatment:    Reason Eval/Treat Not Completed: Patient's level of consciousness;Fatigue/lethargy limiting ability to participate. Upon OT second attempt to see pt for evaluation, pt noted to be lethargic s/p wound vac change. Wife at bedside states pt is "out of it" from pain medication. Pt currently unable to participate in OT evaluation at this time. Will hold and initiate services next date as available and pt medically appropriate.   Shara Blazing, M.S., OTR/L Ascom: [phone] 11/14/19, 1:53 PM

## 2019-11-14 NOTE — Progress Notes (Signed)
PT Cancellation Note  Patient Details Name: [name]. MRN: [id number] DOB: 08/01/1956   Cancelled Treatment:    Reason Eval/Treat Not Completed: Other (comment). Pt currently lethargic s/p wound vac change. Unable to fully participate in therapy treatment today. Will re-attempt next date if able to participate.   Jacquelin Krajewski 11/14/2019, 1:49 PM Greggory Stallion, PT, DPT [phone]

## 2019-11-14 NOTE — Progress Notes (Signed)
[facility] Daily Progress Note   Subjective: 11/11/19: 1. Irrigation and debridement of right AKA stump- skin and soft tissue  Patient moans, does not really make any meaning conversation. Wife at bedside.   Objective: Vitals:   11/13/19 1952 11/14/19 0439 11/14/19 0813 11/14/19 1154  BP: 134/81 130/85 127/77 116/66  Pulse: 100 (!) 103 (!) 107 92  Resp:   18 16  Temp: 97.6 F (36.4 C) 99.1 F (37.3 C) 98.4 F (36.9 C) 98.3 F (36.8 C)  TempSrc: Oral Oral Oral Oral  SpO2: 95% 96% 94% 97%  Weight:  94.7 kg    Height:        Intake/Output Summary (Last 24 hours) at 11/14/2019 1226 Last data filed at 11/14/2019 1154 Gross per 24 hour  Intake 2349.19 ml  Output 0 ml  Net 2349.19 ml   Physical Exam: Lethargic, NAD CV: Irregular Pulmonary: CTA Bilaterally Abdomen: Soft, Nontender, Nondistended Vascular:  Right Lower Extremity: OR VAC removed with wound nurse. 85% granulation tissue / 15% fibrinous exudate. VAC replaced.    Laboratory: CBC    Component Value Date/Time   WBC 13.6 (H) 11/14/2019 0356   HGB 9.2 (L) 11/14/2019 0356   HGB 11.2 (L) 10/23/2019 1403   HCT 30.3 (L) 11/14/2019 0356   HCT 35.0 (L) 10/23/2019 1403   PLT 41 (L) 11/14/2019 0356   PLT 199 10/23/2019 1403   BMET    Component Value Date/Time   NA 136 11/14/2019 0356   NA 140 10/23/2019 1403   K 3.8 11/14/2019 0356   CL 102 11/14/2019 0356   CO2 26 11/14/2019 0356   GLUCOSE 153 (H) 11/14/2019 0356   BUN 24 (H) 11/14/2019 0356   BUN 17 10/23/2019 1403   CREATININE 0.77 11/14/2019 0356   CALCIUM 8.0 (L) 11/14/2019 0356   GFRNONAA >60 11/14/2019 0356   GFRAA >60 11/14/2019 0356   Assessment/Planning: 64 year old male who presented to the [facility] emergency department s/p 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  Next change on Friday so wound care will be able to change VAC M-W-F. Continue 125, continuous. Edema and erythema improved.   2) Sepsis: ID following On IV ABX  Discussed with Eliot Ford PA-C 11/14/2019 12:26 PM

## 2019-11-15 ENCOUNTER — Inpatient Hospital Stay: Payer: Medicare HMO

## 2019-11-15 DIAGNOSIS — D696 Thrombocytopenia, unspecified: Secondary | ICD-10-CM | POA: Diagnosis not present

## 2019-11-15 DIAGNOSIS — T8743 Infection of amputation stump, right lower extremity: Secondary | ICD-10-CM | POA: Diagnosis not present

## 2019-11-15 DIAGNOSIS — R7401 Elevation of levels of liver transaminase levels: Secondary | ICD-10-CM | POA: Diagnosis not present

## 2019-11-15 DIAGNOSIS — I739 Peripheral vascular disease, unspecified: Secondary | ICD-10-CM | POA: Diagnosis not present

## 2019-11-15 LAB — COMPREHENSIVE METABOLIC PANEL
ALT: 94 U/L — ABNORMAL HIGH (ref 0–44)
AST: 63 U/L — ABNORMAL HIGH (ref 15–41)
Albumin: 2.2 g/dL — ABNORMAL LOW (ref 3.5–5.0)
Alkaline Phosphatase: 160 U/L — ABNORMAL HIGH (ref 38–126)
Anion gap: 10 (ref 5–15)
BUN: 15 mg/dL (ref 8–23)
CO2: 26 mmol/L (ref 22–32)
Calcium: 8.1 mg/dL — ABNORMAL LOW (ref 8.9–10.3)
Chloride: 101 mmol/L (ref 98–111)
Creatinine, Ser: 0.58 mg/dL — ABNORMAL LOW (ref 0.61–1.24)
GFR calc Af Amer: >60 mL/min (ref >60)
GFR calc non Af Amer: >60 mL/min (ref >60)
Glucose, Bld: 166 mg/dL — ABNORMAL HIGH (ref 70–99)
Potassium: 3.8 mmol/L (ref 3.5–5.1)
Sodium: 137 mmol/L (ref 135–145)
Total Bilirubin: 2.2 mg/dL — ABNORMAL HIGH (ref 0.3–1.2)
Total Protein: 5.8 g/dL — ABNORMAL LOW (ref 6.5–8.1)

## 2019-11-15 LAB — GLUCOSE, CAPILLARY
Glucose-Capillary: 148 mg/dL — ABNORMAL HIGH (ref 70–99)
Glucose-Capillary: 170 mg/dL — ABNORMAL HIGH (ref 70–99)
Glucose-Capillary: 166 mg/dL — ABNORMAL HIGH (ref 70–99)
Glucose-Capillary: 137 mg/dL — ABNORMAL HIGH (ref 70–99)

## 2019-11-15 LAB — CBC
HCT: 28.8 % — ABNORMAL LOW (ref 39.0–52.0)
Hemoglobin: 8.7 g/dL — ABNORMAL LOW (ref 13.0–17.0)
MCH: 25.5 pg — ABNORMAL LOW (ref 26.0–34.0)
MCHC: 30.2 g/dL (ref 30.0–36.0)
MCV: 84.5 fL (ref 80.0–100.0)
Platelets: 31 10*3/uL — ABNORMAL LOW (ref 150–400)
RBC: 3.41 MIL/uL — ABNORMAL LOW (ref 4.22–5.81)
RDW: 18.2 % — ABNORMAL HIGH (ref 11.5–15.5)
WBC: 13.6 10*3/uL — ABNORMAL HIGH (ref 4.0–10.5)
nRBC: 3.2 % — ABNORMAL HIGH (ref 0.0–0.2)

## 2019-11-15 LAB — HEPATITIS B VIRUS (PROFILE VI): Hep B E Ab: POSITIVE — AB

## 2019-11-15 LAB — AFP TUMOR MARKER: AFP, Serum, Tumor Marker: <0.9 ng/mL (ref 0.0–8.3)

## 2019-11-15 IMAGING — CT CT HEAD W/O CM
3 series · 16 of 47 positions shown, 19 images · non-contrast
Comparison: 9592

CLINICAL DATA: Altered mental status

EXAM:
CT HEAD WITHOUT CONTRAST
TECHNIQUE: Contiguous axial images were obtained from the base of the skull
through the vertex without intravenous contrast.

[Series 3: head wo · axial · 0.42mm/px · z∈[-147,-7]mm · 10 of 34 slices shown, 13 images]
[im 3/34  brain]
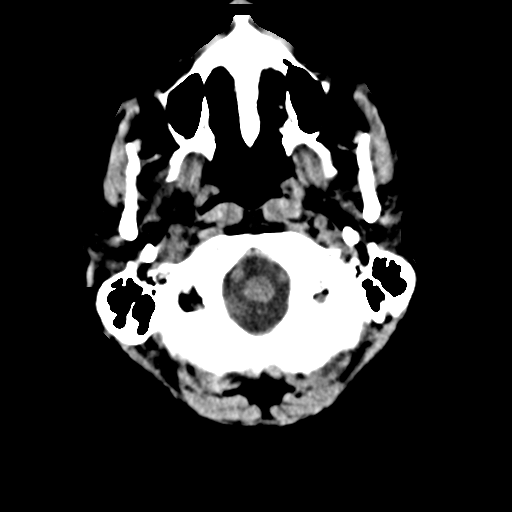
[im 3/34  bone]
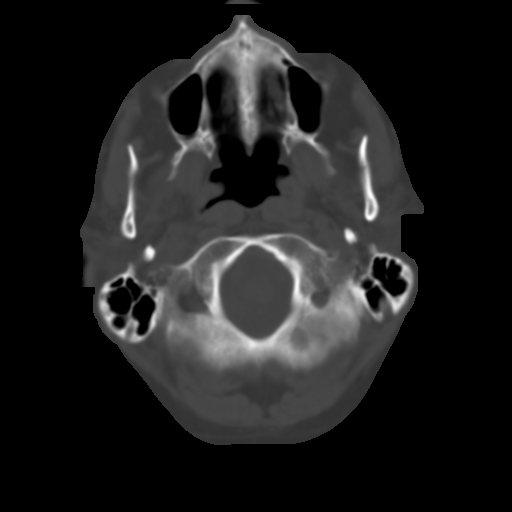
[im 6/34  brain]
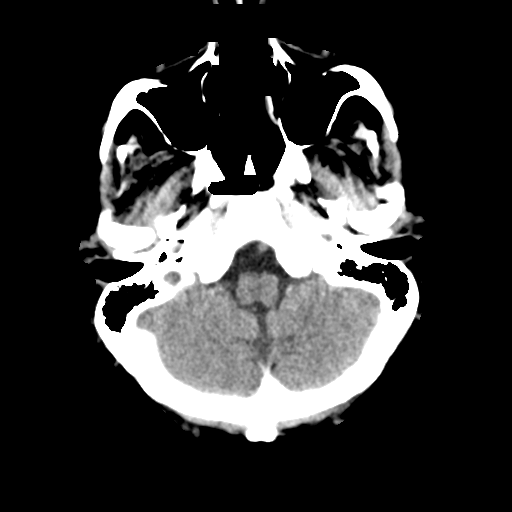
[im 10/34  brain]
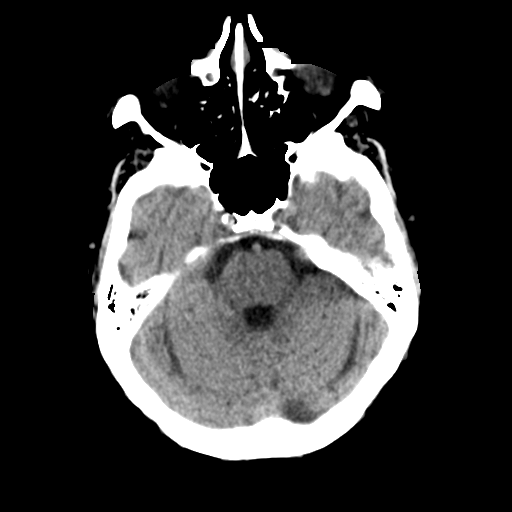
[im 12/34  brain]
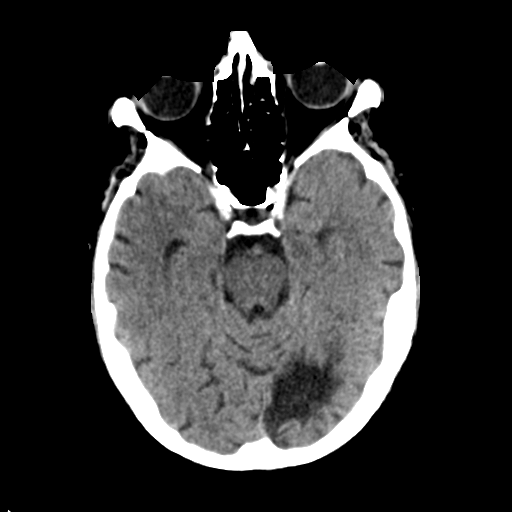
[im 15/34  brain]
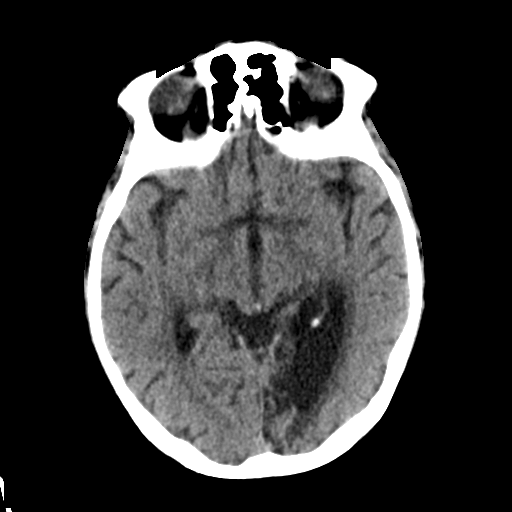
[im 15/34  bone]
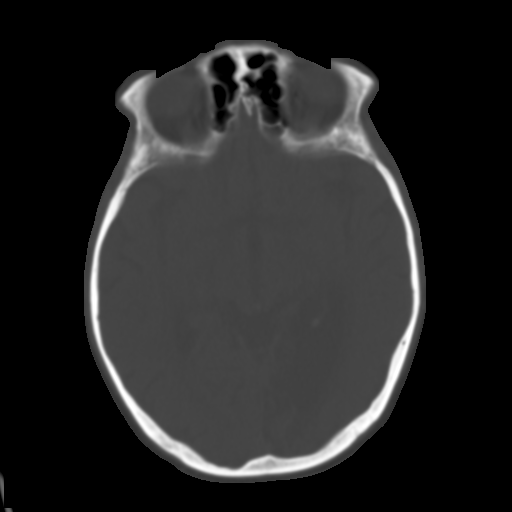
[im 19/34  brain]
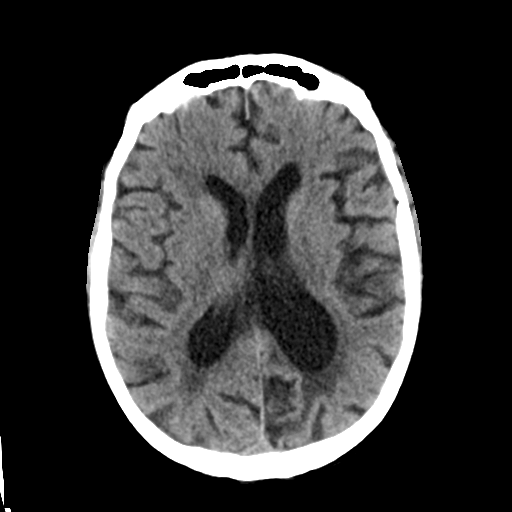
[im 22/34  brain]
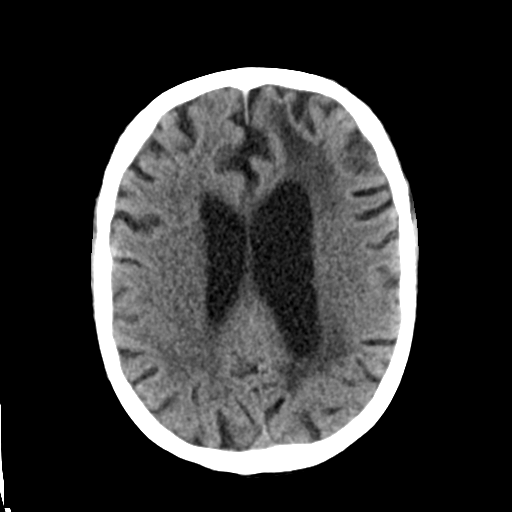
[im 26/34  brain]
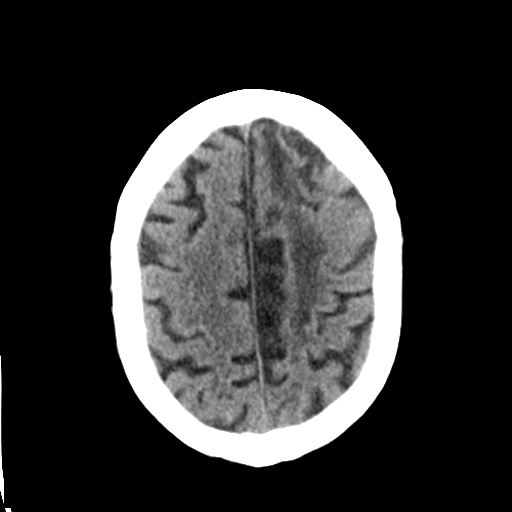
[im 28/34  brain]
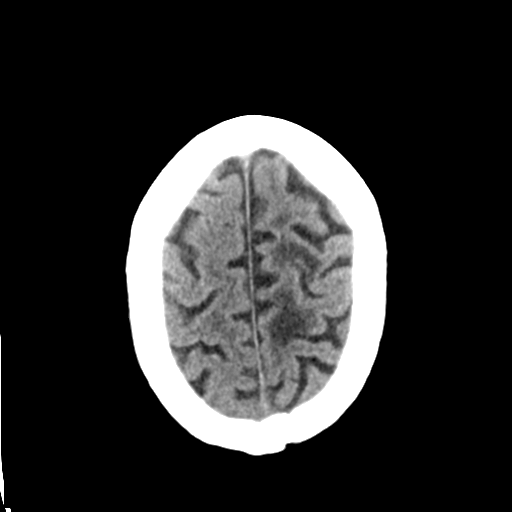
[im 28/34  bone]
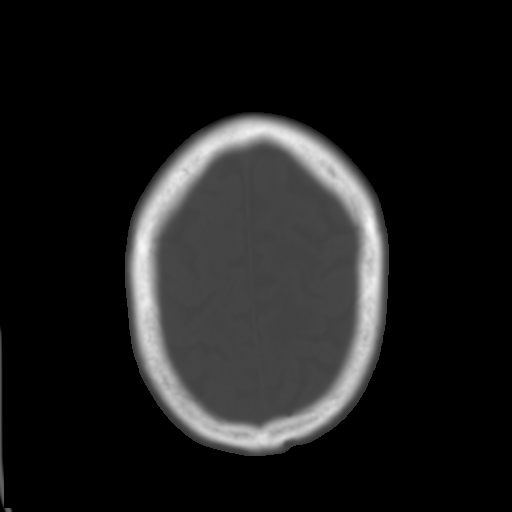
[im 31/34  brain]
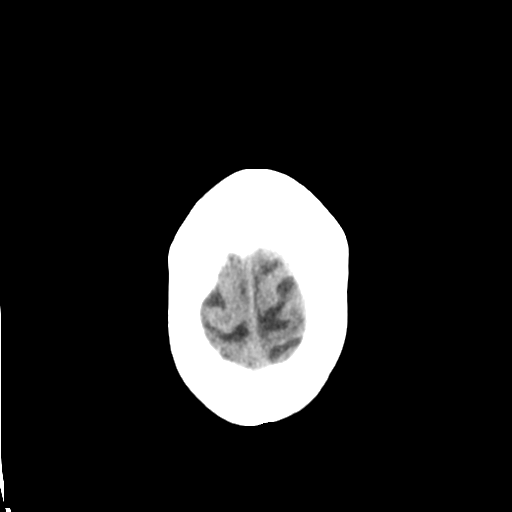

[Series 4: coronal soft tissue · coronal · 0.35mm/px · 3 of 72 slices shown]
[im 24/72  brain]
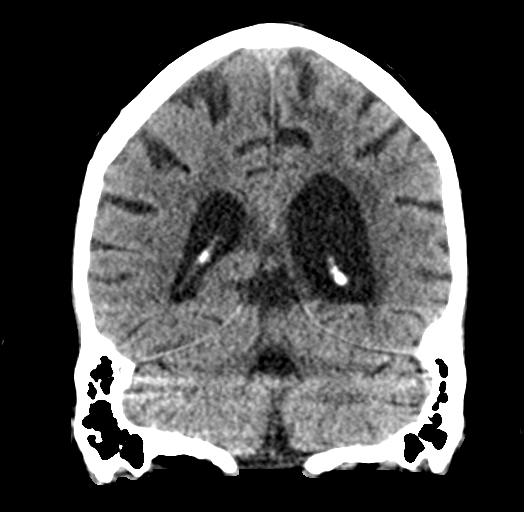
[im 32/72  brain]
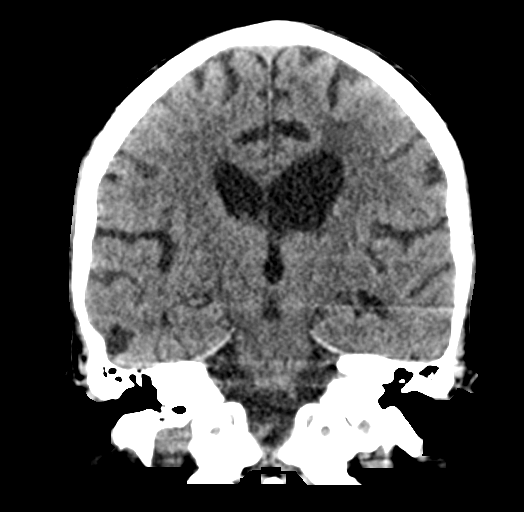
[im 40/72  brain]
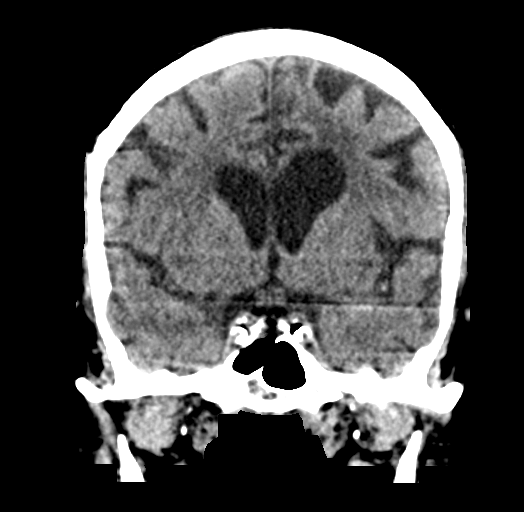

[Series 5: sagittal soft tissue · sagittal · 0.35mm/px · 3 of 55 slices shown]
[im 19/55  brain]
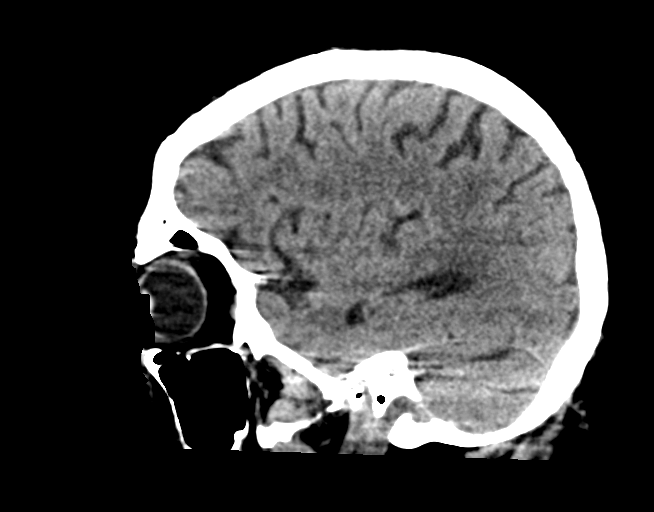
[im 28/55  brain]
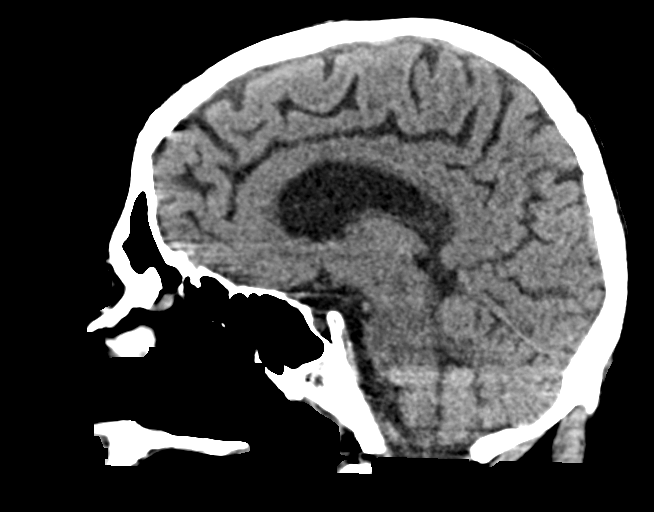
[im 37/55  brain]
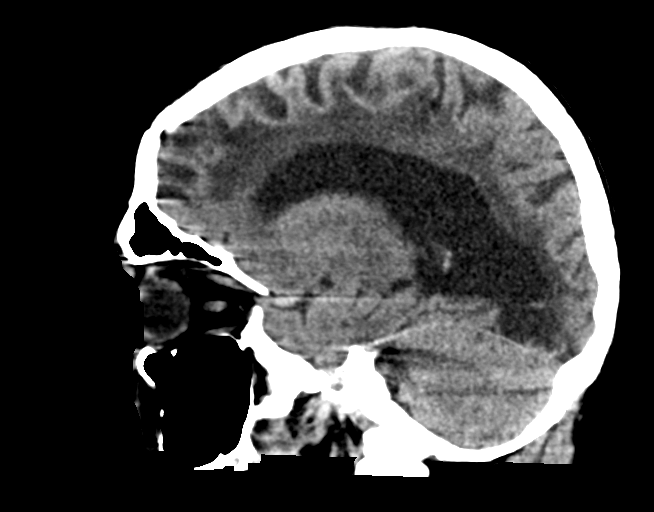

[16 of 47 positions shown; findings below may reference images not displayed]

FINDINGS: Brain: There is no acute intracranial hemorrhage, mass effect, or
edema. No hydrocephalus or extra-axial fluid collection. No new loss
of gray-white differentiation.

There are chronic left frontal, parietal, occipital, and cerebellar
infarcts. This has progressed since 9592. Additional patchy
hypoattenuation in the supratentorial white matter is nonspecific
but probably reflects mild to moderate chronic microvascular
ischemic changes. There is ex vacuo dilatation of the left lateral
ventricle.

Vascular: There is atherosclerotic calcification at the skull base.

Skull: Calvarium is unremarkable.

Sinuses/Orbits: No acute finding.

Other: None.
IMPRESSION: No acute intracranial abnormality.

Multiple chronic left-sided infarcts have progressed since 9592.
Chronic microvascular ischemic changes.

## 2019-11-15 NOTE — Plan of Care (Signed)
  Problem: Clinical Measurements: Goal: Will remain free from infection Outcome: Progressing   Problem: Clinical Measurements: Goal: Diagnostic test results will improve Outcome: Progressing   Problem: Activity: Goal: Risk for activity intolerance will decrease Outcome: Progressing   

## 2019-11-15 NOTE — Progress Notes (Signed)
Date of Admission:  11/10/2019    [name] Wyne [name] 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 He was admitted on 4/9 with declining health and altered mental status. Also had purulent drainage from the stump and fever of 101.4  Underwent debridement of the AKA stump     Subjective: Pt lethargic Says he is okay Groaning in pain when he moves  Medications:  . vitamin C  500 mg Oral Daily  . cholecalciferol  5,000 Units Oral Daily  . docusate sodium  100 mg Oral Daily  . ferrous sulfate  325 mg Oral BID WC  . furosemide  40 mg Intravenous Daily  . gabapentin  1,200 mg Oral TID  . insulin aspart  0-15 Units Subcutaneous TID PC & HS  . metoprolol tartrate  25 mg Oral BID  . multivitamin with minerals  1 tablet Oral Daily  . nystatin   Topical BID  . pantoprazole  20 mg Oral Daily  . pneumococcal 23 valent vaccine  0.5 mL Intramuscular Tomorrow-1000    Objective: Vital signs in last 24 hours: Temp:  [97.5 F (36.4 C)-98.8 F (37.1 C)] 98.3 F (36.8 C) (04/14 0750) Pulse Rate:  [108-113] 113 (04/14 0750) Resp:  [8-18] 14 (04/14 1100) BP: (116-133)/(74-80) 133/77 (04/14 0750) SpO2:  [94 %-97 %] 97 % (04/14 0750) Weight:  [92.5 kg] 92.5 kg (04/14 0421)  PHYSICAL EXAM:  General: Letahrgic, follows commands appropriately pale Lungs: Clear to auscultation bilaterally. No Wheezing or Rhonchi. No rales. Heart: irregular Abdomen: Soft, distended Extremities: rt AKA site- dressing not removed Skin: No rashes or lesions. Or bruising Lymph: Cervical, supraclavicular normal. Neurologic: Grossly non-focal  Lab Results Recent Labs    11/14/19 0356 11/15/19 0623  WBC 13.6* 13.6*  HGB 9.2* 8.7*  HCT 30.3* 28.8*  NA 136 137  K 3.8 3.8  CL 102 101  CO2 26 26  BUN 24* 15  CREATININE 0.77 0.58*   Liver Panel Recent  Labs    11/14/19 0356 11/15/19 0623  PROT 6.3* 5.8*  ALBUMIN 2.3* 2.2*  AST 96* 63*  ALT 138* 94*  ALKPHOS 181* 160*  BILITOT 1.4* 2.2*     Microbiology: Wound culture - staph aureus  Studies/Results: CT ABDOMEN PELVIS W CONTRAST  Result Date: 11/13/2019 CLINICAL DATA:  Cirrhosis, hepatitis B positive, abnormal chest CT EXAM: CT ABDOMEN AND PELVIS WITH CONTRAST TECHNIQUE: Multidetector CT imaging of the abdomen and pelvis was performed using the standard protocol following bolus administration of intravenous contrast. CONTRAST:  131mL OMNIPAQUE IOHEXOL 300 MG/ML  SOLN COMPARISON:  11/12/2019 FINDINGS: Lower chest: There are small bilateral pleural effusions with scattered areas of dependent lower lobe atelectasis. Pericardial calcifications are noted. Hepatobiliary: The liver is diffusely heterogeneous, particular involving the left lobe and superior aspect right lobe liver in the region identified on recent chest CT. There is decreased heterogeneity on delayed imaging through the liver. However, in a patient with cirrhosis and known history of hepatitis, MRI is more sensitive for detection of hepatic neoplasm. There is no biliary dilation. Multiple calcified gallstones are seen without cholecystitis. Pancreas: Mild atrophy of the pancreatic head. Otherwise the pancreas enhances normally. Spleen: Normal in size without focal abnormality. Adrenals/Urinary Tract: The kidneys enhance normally and symmetrically. No urinary tract calculi or obstructive uropathy. High attenuation  within the bladder lumen likely reflects previously excreted contrast from CT performed yesterday. The adrenals are unremarkable. Stomach/Bowel: No bowel obstruction or ileus. The appendix is surgically absent. Diverticulosis of the sigmoid colon without diverticulitis. Vascular/Lymphatic: There are stents within the bilateral common iliac arteries. Moderate atherosclerosis within the abdominal aorta. Portacaval adenopathy is  seen, index lymph node image 30 measuring 16 mm in short axis. Multiple subcentimeter retroperitoneal lymph nodes are nonspecific. Reproductive: Prostate is unremarkable. Other: There is trace abdominal ascites. Diffuse subcutaneous edema is seen involving the bilateral flanks, lower abdominal wall, and pelvis. No free intra-abdominal gas.  Fat containing right inguinal hernia. Musculoskeletal: No acute or destructive bony lesions. Reconstructed images demonstrate no additional findings. IMPRESSION: 1. Continued findings of cirrhosis, with diffuse parenchymal heterogeneity on portal venous imaging. This becomes less heterogeneous on delayed imaging. If underlying hepatic neoplasm remain suspected, MRI is a more sensitive evaluation. 2. Nonspecific portacaval lymphadenopathy. 3. Small bilateral pleural effusions, trace ascites, and diffuse subcutaneous edema. Findings could be related to hypoproteinemic state. 4. Cholelithiasis without cholecystitis. 5. Diverticulosis without diverticulitis. 6. Fat containing right inguinal hernia. Electronically Signed   By: Randa Ngo M.D.   On: 11/13/2019 17:16   MR LIVER W WO CONTRAST  Result Date: 11/15/2019 CLINICAL DATA:  64 year old male with history of hepatic cirrhosis and history of hepatitis B infection. Evaluate for potential liver lesion. EXAM: MRI ABDOMEN WITHOUT AND WITH CONTRAST TECHNIQUE: Multiplanar multisequence MR imaging of the abdomen was performed both before and after the administration of intravenous contrast. CONTRAST:  32mL GADAVIST GADOBUTROL 1 MMOL/ML IV SOLN COMPARISON:  No prior abdominal MRI. CT the abdomen and pelvis 11/13/2019. FINDINGS: Comment: Portions of today's study is significantly limited by patient respiratory motion. Lower chest: Small bilateral pleural effusions lying dependently. Signal intensity in the dependent portions of the lower lobes of the lungs bilaterally, likely to reflect areas of subsegmental atelectasis.  Hepatobiliary: Liver has a slightly nodular contour, indicative of early changes of cirrhosis. On pre gadolinium T2 weighted images there are several somewhat ill-defined areas of mild T2 hyperenhancement scattered throughout the periphery of the hepatic parenchyma, largest of which is between segments 4A and 4B (axial image 17 of series 6) measuring 4.0 x 1.8 cm. These lesions have no definite correlate on pre gadolinium T1 weighted images. However, some of these lesions demonstrate mild hyperintensity on diffusion-weighted sequences. Post gadolinium imaging demonstrates heterogeneous hyper perfusion throughout much of the periphery of the liver, without a discrete well-defined hypervascular mass demonstrating central washout and enhancing capsule. No intra or extrahepatic biliary ductal dilatation. Multiple filling defects are noted in the dependent portion of the gallbladder measuring up to 9 mm, compatible with gallstones. No gallbladder wall thickening or gallbladder distention to suggest an acute cholecystitis at this time. There is a trace amount of pericholecystic fluid, however, this appears to be related to a small volume of ascites adjacent to the liver. Pancreas: No pancreatic mass. No pancreatic ductal dilatation. No pancreatic or peripancreatic fluid collections or inflammatory changes. Spleen:  Unremarkable. Adrenals/Urinary Tract: Subcentimeter T1 hypointense, T2 hyperintense, nonenhancing lesion in the lower pole of the right kidney is compatible with a simple cyst. Left kidney and bilateral adrenal glands are normal in appearance. No hydroureteronephrosis in the visualized portions of the abdomen. Stomach/Bowel: Visualized portions are unremarkable. Vascular/Lymphatic: Signal void in the distal abdominal aorta and proximal iliac vessels related to aorto bi-iliac stent. Enlarged lymph nodes in the hepatic duodenal ligament measuring up to 1.7 cm in short axis (axial  image 41 of series 13) and in the  portacaval nodal station measuring 1.3 cm in short axis (axial image 48 of series 13). Other:  Trace volume of ascites. Musculoskeletal: No aggressive appearing osseous lesions are noted in the visualized portions of the skeleton. IMPRESSION: 1. Morphologic changes in the liver compatible with underlying cirrhosis. There are multiple ill-defined areas in the periphery of the liver which demonstrate heterogeneous signal intensity and perfusion characteristics, as discussed above. The largest of these areas measures up to 4.0 x 1.8 cm between segments 4A and 4B and is categorized as LI-RADS 3 based on today's findings. Repeat abdominal MRI with and without IV gadolinium is recommended in 6 months to ensure the stability of these findings. 2. Trace volume of ascites. 3. Small bilateral pleural effusions lying dependently. 4. Cholelithiasis. Electronically Signed   By: Vinnie Langton M.D.   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 Discussed the management with his wife

## 2019-11-15 NOTE — Evaluation (Signed)
Occupational Therapy Evaluation Patient Details Name: [name]. MRN: [id number] DOB: 15-Aug-1955 Today's Date: 11/15/2019    History of Present Illness Pt admitted for sepsis secondary to wound infection and cellulitis. Pt is now s/p I&D of R residual limb on 11/12/19. 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 Other PMH includes expressive aphasia s/p L PCA CVA, Afib, and GERD.   Clinical Impression   [name] was seen for OT/PT co-treat/evaluation this date, POD#3 from above surgery. Pt received semi-supine in bed with spouse present at bedside. Information regarding home set-up/PLOF obtained from chart/spouse this date. Pt wife states that [name] is more alert this date, however he is not at his baseline level of cognition. Pt oriented to self only, and is unable to answer A&O questions regarding place, situation, or time. Despite being pre-medicated prior to session, pt noted to be in significant pain, yelling out with HR elevated. Per pt wife, prior to initial admission pt was generally independent with BADL and light IADL management. She states he requires some assistance with transferring in/out of his tub shower using his tub-transfer bench, but otherwise pt is able to bathe independently. She reports that he tries to stay as independent as possible with dressing and often engages in light meal prep when not limited by pain. During session, Pt states "I want to sit up" and agreeable to OT/PTA assisting with functional mobility. Pt is eager to return to PLOF with less pain and improved safety and independence. Pt currently requires +2 max assist for bed mobility, he is unable to come to full upright sitting position at EOB or attempt further functional mobility this date 2/2 pain and elevated HR. See below for additional details. Pt requires max-total assistance for bed level LB ADL management and toileting at this time, as well as minimal-moderate  assist with UB bathing, dressing, and grooming tasks at bed level. Pt would benefit from skilled OT services including additional instruction in dressing techniques with or without assistive devices for dressing and bathing skills to support recall and carryover prior to discharge and ultimately to maximize safety, independence, and minimize falls risk and caregiver burden. Recommending STR upon hospital DC to maximize pt safety and return to PLOF.      Follow Up Recommendations  SNF;Supervision/Assistance - 24 hour    Equipment Recommendations  3 in 1 bedside commode    Recommendations for Other Services       Precautions / Restrictions Precautions Precautions: Fall Restrictions Weight Bearing Restrictions: Yes RLE Weight Bearing: Non weight bearing      Mobility Bed Mobility Overal bed mobility: Needs Assistance Bed Mobility: Supine to Sit;Sit to Supine     Supine to sit: Max assist;+2 for physical assistance Sit to supine: Max assist;+2 for physical assistance   General bed mobility comments: unable to come to full sit secondary to pain/HR elevation this date.  Transfers                 General transfer comment: Deferred.    Balance Overall balance assessment: Needs assistance Sitting-balance support: Feet supported;Bilateral upper extremity supported Sitting balance-Leahy Scale: Poor Sitting balance - Comments: Pt is unable to come to full sitting position at EOB this date. He grips tightly onto bed rails to his left side and resists assistance from therapists yelling/moaning throughout mobility attempts.       Standing balance comment: NT  ADL either performed or assessed with clinical judgement   ADL Overall ADL's : Needs assistance/impaired Eating/Feeding: Bed level;Minimal assistance   Grooming: Wash/dry hands;Wash/dry face;Bed level;Minimal assistance   Upper Body Bathing: Moderate assistance;Bed level                              General ADL Comments: Pt is significantly functionally limited by pain, decreased cognition and decreased activity tolerance. He is unable to come to sitting on EOB despite +2 max assist this date. He currently requires MIN A to MOD A for bed level UB ADL management, as well as MAX A to TOTAL A for bed level LB ADL management.     Vision Baseline Vision/History: Wears glasses Wears Glasses: At all times Patient Visual Report: No change from baseline Additional Comments: Per chart, pt has hx of being blind in R eye.     Perception     Praxis      Pertinent Vitals/Pain Pain Assessment: Faces Faces Pain Scale: Hurts whole lot Pain Location: "all over" Pain Descriptors / Indicators: Moaning;Grimacing;Guarding;Restless Pain Intervention(s): Limited activity within patient's tolerance;Premedicated before session;Repositioned     Hand Dominance Right   Extremity/Trunk Assessment Upper Extremity Assessment Upper Extremity Assessment: Overall WFL for tasks assessed;Difficult to assess due to impaired cognition(Pt yells out in pain when either arm is touched, but is able to use BUE to strongly grip bed rails/therapists during session. Strength appears WFLs.)   Lower Extremity Assessment Lower Extremity Assessment: Generalized weakness;Difficult to assess due to impaired cognition;RLE deficits/detail RLE Deficits / Details: s/p R I&D of AKA wound. RLE: Unable to fully assess due to pain       Communication Communication Communication: No difficulties   Cognition Arousal/Alertness: Awake/alert Behavior During Therapy: Restless;Agitated;Anxious Overall Cognitive Status: Impaired/Different from baseline Area of Impairment: Orientation;Following commands;Safety/judgement                 Orientation Level: Disoriented to;Place;Situation;Time     Following Commands: Follows one step commands inconsistently       General Comments: Pt awake/alert,  but consistently moaning/yelling out in pain. Is unable to state place/time or localize pain. Follows VCs with maximal multimodal prompting. Answers to his name, but otherwise disoriented to place, time, situation. States "don't leave me baby" multiple times to wife/therapists. Wife at bedside reports cognition is improved from previous date, but pt still not at baseline level of cognition.   General Comments  Pt HR monitored via tele monitor in room. With mobility pt HR noted to reach 150's with patient endorsing significant pain. Further mobility attempts deferred and pt returned to supine. HR noted to return to [facility] 110's by end of session. SpO2 monitored and noted to remain WFLs 97% on RA. RN notified.    Exercises Other Exercises Other Exercises: Pt education limited due to increased pain/impaired cogntion this date. Evaluation information obtained from pt caregiver at bedside.   Shoulder Instructions      Home Living Family/patient expects to be discharged to:: Private residence Living Arrangements: Spouse/significant other Available Help at Discharge: Family Type of Home: House Home Access: Stairs to enter Technical brewer of Steps: 4 Entrance Stairs-Rails: Right;Left;Can reach both [place]: One level     Bathroom Shower/Tub: Occupational psychologist: [place]: Environmental consultant - 2 wheels;Bedside commode;Crutches;Wheelchair - manual          Prior Functioning/Environment Level of Independence: Needs assistance  Gait / Transfers Assistance Needed: mostly WC in house, performed modI stand pivot transfers adn scoot transferrs PTA. ADL's / Homemaking Assistance Needed: Pt was able to perform most BADLs with some supv for safety from spouse to t/f into shower. Wife encourages pt to dress himself, reports helping very occasionally only only on severe pain days or if in a hurry to get to an appointment.   Comments: able to use B UE and feet to self  propel WC.        OT Problem List: Decreased strength;Decreased range of motion;Decreased activity tolerance;Impaired balance (sitting and/or standing);Decreased coordination;Decreased knowledge of use of DME or AE;Decreased knowledge of precautions;Pain;Decreased safety awareness;Decreased cognition      OT Treatment/Interventions: Self-care/ADL training;Therapeutic exercise;DME and/or AE instruction;Therapeutic activities;Patient/family education;Balance training    OT Goals(Current goals can be found in the care plan section) Acute Rehab OT Goals Patient Stated Goal: To have less pain OT Goal Formulation: With patient/family Time For Goal Achievement: 11/29/19 Potential to Achieve Goals: Good ADL Goals Pt Will Perform Grooming: with modified independence;sitting Pt Will Perform Lower Body Dressing: with min assist;with adaptive equipment;sit to/from stand(With LRAD PRN for improved safety and functional indepencence.) Pt Will Transfer to Toilet: with min assist;stand pivot transfer;bedside commode(With LRAD PRN for improved safety and functional indepencence.) Pt Will Perform Toileting - Clothing Manipulation and hygiene: with min assist;sit to/from stand(With LRAD PRN for improved safety and functional indepencence.)  OT Frequency: Min 2X/week   Barriers to D/C:            Co-evaluation PT/OT/SLP Co-Evaluation/Treatment: Yes Reason for Co-Treatment: Necessary to address cognition/behavior during functional activity;For patient/therapist safety;To address functional/ADL transfers PT goals addressed during session: Mobility/safety with mobility;Balance;Strengthening/ROM OT goals addressed during session: ADL's and self-care;Proper use of Adaptive equipment and DME      AM-PAC OT "6 Clicks" Daily Activity     Outcome Measure Help from another person eating meals?: A Little Help from another person taking care of personal grooming?: A Little Help from another person toileting,  which includes using toliet, bedpan, or urinal?: Total Help from another person bathing (including washing, rinsing, drying)?: A Lot Help from another person to put on and taking off regular upper body clothing?: A Lot Help from another person to put on and taking off regular lower body clothing?: A Lot 6 Click Score: 13   End of Session    Activity Tolerance: Treatment limited secondary to agitation;Patient limited by pain Patient left: in bed;with call bell/phone within reach;with bed alarm set;with family/visitor present;Other (comment)(With knees of bed flat to optimize positioning for recovery s/p AKA.)  OT Visit Diagnosis: Other abnormalities of gait and mobility (R26.89);Muscle weakness (generalized) (M62.81)                Time: [id number] OT Time Calculation (min): 28 min Charges:  OT General Charges $OT Visit: 1 Visit OT Evaluation $OT Eval Moderate Complexity: 1 Mod  Shara Blazing, M.S., OTR/L Ascom: [phone] 11/15/19, 10:17 AM

## 2019-11-15 NOTE — Progress Notes (Signed)
[facility] Daily Progress Note   Subjective: 11/11/19: 1. Irrigation and debridement ofright AKA stump - skin and soft tissue  Patient with continued mental status changes. Minimal improvement when compared to yesterday.   Objective: Vitals:   11/15/19 0900 11/15/19 1000 11/15/19 1100 11/15/19 1217  BP:    119/76  Pulse:    93  Resp: 10 15 14 16   Temp:    98 F (36.7 C)  TempSrc:      SpO2:    97%  Weight:      Height:        Intake/Output Summary (Last 24 hours) at 11/15/2019 1330 Last data filed at 11/14/2019 1926 Gross per 24 hour  Intake 240 ml  Output 0 ml  Net 240 ml   Physical Exam: Lethargic, NAD CV: Irregular Pulmonary: CTA Bilaterally Abdomen: Soft, Nontender, Nondistended Vascular:             Right Lower Extremity: VAC to suction, without leak. Erythema / swelling to leg with improvement.    Laboratory: CBC    Component Value Date/Time   WBC 13.6 (H) 11/15/2019 0623   HGB 8.7 (L) 11/15/2019 0623   HGB 11.2 (L) 10/23/2019 1403   HCT 28.8 (L) 11/15/2019 0623   HCT 35.0 (L) 10/23/2019 1403   PLT 31 (L) 11/15/2019 0623   PLT 199 10/23/2019 1403   BMET    Component Value Date/Time   NA 137 11/15/2019 0623   NA 140 10/23/2019 1403   K 3.8 11/15/2019 0623   CL 101 11/15/2019 0623   CO2 26 11/15/2019 0623   GLUCOSE 166 (H) 11/15/2019 0623   BUN 15 11/15/2019 0623   BUN 17 10/23/2019 1403   CREATININE 0.58 (L) 11/15/2019 0623   CALCIUM 8.1 (L) 11/15/2019 0623   GFRNONAA >60 11/15/2019 0623   GFRAA >60 11/15/2019 [id number]   Assessment/Planning: 64 year old male who presented to the [facility] emergency department 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 Next change on Friday. Wound care will be able to change VAC M-W-F. Continue 125,  continuous.   2) Mental Status: Confusion persistent. Sepsis could be reason, however in setting of Afib and hx of CVA will order CTA head to rule out anything that may have occurred recently.  2) Sepsis: ID following On IV ABX  Seen and examined with Eliot Ford PA-C 11/15/2019 1:30 PM

## 2019-11-15 NOTE — Progress Notes (Signed)
PROGRESS NOTE    Glenn B Strain Jr.   VX:[id number]  DOB: August 02, 1956  DOA: 11/10/2019 PCP: [name], DO   Brief Narrative:  Glenn Gerold. is a 64 y.o. Caucasian male with a known history of CVA, PAD, blindness in right eye,  who underwent right above-knee amputation bed a couple weeks ago who presented with fever 101.4 and purulent drainage from a recent right AKA. He was started on IV antibiotics.  A;so noted was that he was in A-fib with RVR for which he was started on a Cardizem infusion.   Subjective: No complaints.     Assessment & Plan:   Active Problems:   Sepsis with stump infection - appreciate ID and vascular surgery assistance- cont Vanc, ceftriaxone and Flagyl - s/p I and D - cont wound vac with changes on MWF  PAD - s/p angioplasty and multiple stents  Cirrhosis - per wife he has a h/o ETOH abuse but he has stopped drinking completely - trace ascites on MRI  Hep B surface Ag + and E Ab + - no symptoms of hepatitis  Multiple liver lesions - cont outpatient surveillance of these with serial MRI   PAF - on Metoprolol - Eliquis on hold  Thrombocytopenia - possibly related to cirrhosis and maybe also sepsis - platelets 31 today- holding Eliquis - follow  DM2 - on Jardiance as outpt- a1C IS 7.7 - cont SSI  Time spent in minutes: 35 DVT prophylaxis: holding Code Status: Full code Family Communication: wife  Disposition Plan:  From home- not ready to d/c due to acute infection - will need SNF Consultants:   ID  Vascular surgery Procedures:   I and D Antimicrobials:  Anti-infectives (From admission, onward)   Start     Dose/Rate Route Frequency Ordered Stop   11/13/19 2200  cefTRIAXone (ROCEPHIN) 2 g in sodium chloride 0.9 % 100 mL IVPB     2 g 200 mL/hr over 30 Minutes Intravenous Every 24 hours 11/13/19 2007     11/13/19 2200  metroNIDAZOLE (FLAGYL) IVPB 500 mg     500 mg 100 mL/hr over 60 Minutes Intravenous Every 8 hours  11/13/19 2007     11/11/19 1400  vancomycin (VANCOREADY) IVPB 1250 mg/250 mL     1,250 mg 166.7 mL/hr over 90 Minutes Intravenous Every 12 hours 11/11/19 0158     11/11/19 0600  piperacillin-tazobactam (ZOSYN) IVPB 3.375 g  Status:  Discontinued     3.375 g 12.5 mL/hr over 240 Minutes Intravenous Every 8 hours 11/11/19 0322 11/13/19 2007   11/11/19 0215  vancomycin (VANCOCIN) IVPB 1000 mg/200 mL premix  Status:  Discontinued     1,000 mg 200 mL/hr over 60 Minutes Intravenous  Once 11/11/19 0214 11/11/19 0321   11/11/19 0215  piperacillin-tazobactam (ZOSYN) IVPB 3.375 g  Status:  Discontinued     3.375 g 100 mL/hr over 30 Minutes Intravenous Every 6 hours 11/11/19 0214 11/11/19 0321   11/11/19 0015  vancomycin (VANCOCIN) IVPB 1000 mg/200 mL premix     1,000 mg 200 mL/hr over 60 Minutes Intravenous  Once 11/11/19 0010 11/11/19 0252   11/10/19 2345  vancomycin (VANCOCIN) IVPB 1000 mg/200 mL premix     1,000 mg 200 mL/hr over 60 Minutes Intravenous  Once 11/10/19 2342 11/11/19 0127   11/10/19 2345  cefTRIAXone (ROCEPHIN) 2 g in sodium chloride 0.9 % 100 mL IVPB     2 g 200 mL/hr over 30 Minutes Intravenous  Once 11/10/19 2342  11/11/19 0131       Objective: Vitals:   11/14/19 1833 11/14/19 1926 11/15/19 0421 11/15/19 0750  BP:  126/80 116/80 133/77  Pulse: (!) 109 (!) 110 (!) 108 (!) 113  Resp:  17  18  Temp:  (!) 97.5 F (36.4 C) 98.8 F (37.1 C) 98.3 F (36.8 C)  TempSrc:  Oral Oral Oral  SpO2:  96% 95% 97%  Weight:   92.5 kg   Height:        Intake/Output Summary (Last 24 hours) at 11/15/2019 1112 Last data filed at 11/14/2019 1926 Gross per 24 hour  Intake 240 ml  Output 0 ml  Net 240 ml   Filed Weights   11/13/19 0416 11/14/19 0439 11/15/19 0421  Weight: 93.4 kg 94.7 kg 92.5 kg    Examination: General exam: Appears comfortable - quite sleepy HEENT: PERRLA, oral mucosa moist, no sclera icterus or thrush Respiratory system: Clear to auscultation. Respiratory  effort normal. Cardiovascular system: S1 & S2 heard, RRR.   Gastrointestinal system: Abdomen soft, non-tender, nondistended. Normal bowel sounds. Central nervous system: Alert and oriented. No focal neurological deficits. Extremities: No cyanosis, clubbing - dressing not opened Psychiatry:  Mood & affect appropriate.     Data Reviewed: I have personally reviewed following labs and imaging studies  CBC: Recent Labs  Lab 11/10/19 2357 11/10/19 2357 11/11/19 0352 11/12/19 0855 11/13/19 0349 11/14/19 0356 11/15/19 0623  WBC 12.8*   < > 14.1* 14.1* 18.9* 13.6* 13.6*  NEUTROABS 10.1*  --   --   --   --   --   --   HGB 9.5*   < > 8.9* 8.5* 8.6* 9.2* 8.7*  HCT 30.0*   < > 27.5* 27.2* 28.6* 30.3* 28.8*  MCV 80.4   < > 80.6 81.2 85.1 83.5 84.5  PLT 91*   < > 76* 144* 116* 41* 31*   < > = values in this interval not displayed.   Basic Metabolic Panel: Recent Labs  Lab 11/11/19 0352 11/12/19 0855 11/13/19 0349 11/14/19 0356 11/15/19 0623  NA 129* 129* 131* 136 137  K 4.2 4.1 4.8 3.8 3.8  CL 98 96* 98 102 101  CO2 20* 22 23 26 26   GLUCOSE 204* 309* 194* 153* 166*  BUN 21 35* 33* 24* 15  CREATININE 0.75 0.84 0.89 0.77 0.58*  CALCIUM 7.7* 7.8* 7.9* 8.0* 8.1*   GFR: Estimated Creatinine Clearance: 103.4 mL/min (A) (by C-G formula based on SCr of 0.58 mg/dL (L)). Liver Function Tests: Recent Labs  Lab 11/10/19 2357 11/13/19 0349 11/14/19 0356 11/15/19 0623  AST 359* 201* 96* 63*  ALT 280* 195* 138* 94*  ALKPHOS 190* 186* 181* 160*  BILITOT 2.2* 1.8* 1.4* 2.2*  PROT 7.2 6.3* 6.3* 5.8*  ALBUMIN 2.5* 2.3* 2.3* 2.2*   Recent Labs  Lab 11/10/19 2357  LIPASE 16   No results for input(s): AMMONIA in the last 168 hours. Coagulation Profile: Recent Labs  Lab 11/10/19 2357 11/13/19 0723  INR 1.9* 2.8*   Cardiac Enzymes: No results for input(s): CKTOTAL, CKMB, CKMBINDEX, TROPONINI in the last 168 hours. BNP (last 3 results) No results for input(s): PROBNP in the last  8760 hours. HbA1C: No results for input(s): HGBA1C in the last 72 hours. CBG: Recent Labs  Lab 11/14/19 0808 11/14/19 1221 11/14/19 1638 11/14/19 2050 11/15/19 0749  GLUCAP 151* 152* 128* 150* 148*   Lipid Profile: No results for input(s): CHOL, HDL, LDLCALC, TRIG, CHOLHDL, LDLDIRECT in the last 72  hours. Thyroid Function Tests: No results for input(s): TSH, T4TOTAL, FREET4, T3FREE, THYROIDAB in the last 72 hours. Anemia Panel: No results for input(s): VITAMINB12, FOLATE, FERRITIN, TIBC, IRON, RETICCTPCT in the last 72 hours. Urine analysis:    Component Value Date/Time   COLORURINE AMBER (A) 11/10/2019 2357   APPEARANCEUR HAZY (A) 11/10/2019 2357   APPEARANCEUR Clear 07/03/2019 1513   LABSPEC 1.022 11/10/2019 2357   PHURINE 5.0 11/10/2019 2357   GLUCOSEU >=500 (A) 11/10/2019 2357   HGBUR SMALL (A) 11/10/2019 2357   BILIRUBINUR NEGATIVE 11/10/2019 2357   BILIRUBINUR Negative 07/03/2019 1513   KETONESUR 20 (A) 11/10/2019 2357   PROTEINUR 30 (A) 11/10/2019 2357   NITRITE NEGATIVE 11/10/2019 2357   LEUKOCYTESUR NEGATIVE 11/10/2019 2357   Sepsis Labs: @LABRCNTIP (procalcitonin:4,lacticidven:4) ) Recent Results (from the past 240 hour(s))  Blood Culture (routine x 2)     Status: None (Preliminary result)   Collection Time: 11/10/19 11:57 PM   Specimen: BLOOD  Result Value Ref Range Status   Specimen Description BLOOD BLOOD LEFT HAND  Final   Special Requests   Final    BOTTLES DRAWN AEROBIC AND ANAEROBIC Blood Culture results may not be optimal due to an excessive volume of blood received in culture bottles   Culture   Final    NO GROWTH 4 DAYS Performed at [facility], [address], [place], [place] [address]    Report Status PENDING  Incomplete  Blood Culture (routine x 2)     Status: None (Preliminary result)   Collection Time: 11/10/19 11:57 PM   Specimen: BLOOD  Result Value Ref Range Status   Specimen Description BLOOD BLOOD RIGHT HAND  Final    Special Requests   Final    BOTTLES DRAWN AEROBIC AND ANAEROBIC Blood Culture results may not be optimal due to an inadequate volume of blood received in culture bottles   Culture   Final    NO GROWTH 4 DAYS Performed at [facility], [address], [place], [place] [address]    Report Status PENDING  Incomplete  Urine culture     Status: Abnormal   Collection Time: 11/10/19 11:57 PM   Specimen: In/Out Cath Urine  Result Value Ref Range Status   Specimen Description   Final    IN/OUT CATH URINE Performed at [facility], [address], [place], [place] [address]    Special Requests   Final    NONE Performed at [facility], [address] [place], [place] [address]    Culture MULTIPLE SPECIES PRESENT, SUGGEST RECOLLECTION (A)  Final   Report Status 11/12/2019 FINAL  Final  Aerobic/Anaerobic Culture (surgical/deep wound)     Status: Abnormal (Preliminary result)   Collection Time: 11/10/19 11:57 PM   Specimen: Leg; Wound  Result Value Ref Range Status   Specimen Description   Final    LEG RIGHT Performed at [facility], [address] [place], [place] [address]    Special Requests   Final    Normal Performed at [facility], [address] [place], [place] [address]    Gram Stain   Final    NO WBC SEEN MODERATE [place] Performed at [facility], [name], [place], [place] [address]    Culture (A)  Final    MULTIPLE ORGANISMS PRESENT, NONE PREDOMINANT NO ANAEROBES ISOLATED; CULTURE IN PROGRESS FOR 5 DAYS    Report Status PENDING  Incomplete  Respiratory Panel by RT PCR (  Flu A&B, Covid) - Nasopharyngeal Swab     Status: None   Collection Time: 11/11/19  1:46 AM   Specimen: Nasopharyngeal Swab  Result Value Ref Range Status   SARS Coronavirus 2 by RT PCR NEGATIVE NEGATIVE Final    Comment: (NOTE) SARS-CoV-2 target nucleic acids are NOT DETECTED. The SARS-CoV-2 RNA is generally detectable in  upper respiratoy specimens during the acute phase of infection. The lowest concentration of SARS-CoV-2 viral copies this assay can detect is 131 copies/mL. A negative result does not preclude SARS-Cov-2 infection and should not be used as the sole basis for treatment or other patient management decisions. A negative result may occur with  improper specimen collection/handling, submission of specimen other than nasopharyngeal swab, presence of viral mutation(s) within the areas targeted by this assay, and inadequate number of viral copies (<131 copies/mL). A negative result must be combined with clinical observations, patient history, and epidemiological information. The expected result is Negative. Fact Sheet for Patients:  PinkCheek.be Fact Sheet for Healthcare Providers:  GravelBags.it This test is not yet ap proved or cleared by the [place] FDA and  has been authorized for detection and/or diagnosis of SARS-CoV-2 by FDA under an Emergency Use Authorization (EUA). This EUA will remain  in effect (meaning this test can be used) for the duration of the COVID-19 declaration under Section 564(b)(1) of the Act, 21 U.S.C. section 360bbb-3(b)(1), unless the authorization is terminated or revoked sooner.    Influenza A by PCR NEGATIVE NEGATIVE Final   Influenza B by PCR NEGATIVE NEGATIVE Final    Comment: (NOTE) The Xpert Xpress SARS-CoV-2/FLU/RSV assay is intended as an aid in  the diagnosis of influenza from Nasopharyngeal swab specimens and  should not be used as a sole basis for treatment. Nasal washings and  aspirates are unacceptable for Xpert Xpress SARS-CoV-2/FLU/RSV  testing. Fact Sheet for Patients: PinkCheek.be Fact Sheet for Healthcare Providers: GravelBags.it This test is not yet approved or cleared by the [place] FDA and  has been authorized for  detection and/or diagnosis of SARS-CoV-2 by  FDA under an Emergency Use Authorization (EUA). This EUA will remain  in effect (meaning this test can be used) for the duration of the  Covid-19 declaration under Section 564(b)(1) of the Act, 21  U.S.C. section 360bbb-3(b)(1), unless the authorization is  terminated or revoked. Performed at [facility], [address] [place], [place] [address]   Aerobic/Anaerobic Culture (surgical/deep wound)     Status: None (Preliminary result)   Collection Time: 11/11/19  4:44 PM   Specimen: PATH Amputaion Arm/Leg; Tissue  Result Value Ref Range Status   Specimen Description   Final    WOUND Performed at [facility], [address], [place], [place] [address]    Special Requests   Final    NONE Performed at [facility], [address] [place], [place] [address]    Gram Stain   Final    FEW WBC PRESENT, PREDOMINANTLY PMN FEW GRAM POSITIVE COCCI IN CLUSTERS RARE GRAM NEGATIVE RODS Performed at [facility], [place], [place], [place] [address]    Culture   Final    CULTURE REINCUBATED FOR BETTER GROWTH NO ANAEROBES ISOLATED; CULTURE IN PROGRESS FOR 5 DAYS    Report Status PENDING  Incomplete         Radiology Studies: CT ABDOMEN PELVIS W CONTRAST  Result Date: 11/13/2019 CLINICAL DATA:  Cirrhosis, hepatitis B positive, abnormal chest CT EXAM: CT ABDOMEN AND  PELVIS WITH CONTRAST TECHNIQUE: Multidetector CT imaging of the abdomen and pelvis was performed using the standard protocol following bolus administration of intravenous contrast. CONTRAST:  121mL OMNIPAQUE IOHEXOL 300 MG/ML  SOLN COMPARISON:  11/12/2019 FINDINGS: Lower chest: There are small bilateral pleural effusions with scattered areas of dependent lower lobe atelectasis. Pericardial calcifications are noted. Hepatobiliary: The liver is diffusely heterogeneous, particular involving the left lobe and superior aspect right lobe liver in the  region identified on recent chest CT. There is decreased heterogeneity on delayed imaging through the liver. However, in a patient with cirrhosis and known history of hepatitis, MRI is more sensitive for detection of hepatic neoplasm. There is no biliary dilation. Multiple calcified gallstones are seen without cholecystitis. Pancreas: Mild atrophy of the pancreatic head. Otherwise the pancreas enhances normally. Spleen: Normal in size without focal abnormality. Adrenals/Urinary Tract: The kidneys enhance normally and symmetrically. No urinary tract calculi or obstructive uropathy. High attenuation within the bladder lumen likely reflects previously excreted contrast from CT performed yesterday. The adrenals are unremarkable. Stomach/Bowel: No bowel obstruction or ileus. The appendix is surgically absent. Diverticulosis of the sigmoid colon without diverticulitis. Vascular/Lymphatic: There are stents within the bilateral common iliac arteries. Moderate atherosclerosis within the abdominal aorta. Portacaval adenopathy is seen, index lymph node image 30 measuring 16 mm in short axis. Multiple subcentimeter retroperitoneal lymph nodes are nonspecific. Reproductive: Prostate is unremarkable. Other: There is trace abdominal ascites. Diffuse subcutaneous edema is seen involving the bilateral flanks, lower abdominal wall, and pelvis. No free intra-abdominal gas.  Fat containing right inguinal hernia. Musculoskeletal: No acute or destructive bony lesions. Reconstructed images demonstrate no additional findings. IMPRESSION: 1. Continued findings of cirrhosis, with diffuse parenchymal heterogeneity on portal venous imaging. This becomes less heterogeneous on delayed imaging. If underlying hepatic neoplasm remain suspected, MRI is a more sensitive evaluation. 2. Nonspecific portacaval lymphadenopathy. 3. Small bilateral pleural effusions, trace ascites, and diffuse subcutaneous edema. Findings could be related to  hypoproteinemic state. 4. Cholelithiasis without cholecystitis. 5. Diverticulosis without diverticulitis. 6. Fat containing right inguinal hernia. Electronically Signed   By: Randa Ngo M.D.   On: 11/13/2019 17:16   MR LIVER W WO CONTRAST  Result Date: 11/15/2019 CLINICAL DATA:  64 year old male with history of hepatic cirrhosis and history of hepatitis B infection. Evaluate for potential liver lesion. EXAM: MRI ABDOMEN WITHOUT AND WITH CONTRAST TECHNIQUE: Multiplanar multisequence MR imaging of the abdomen was performed both before and after the administration of intravenous contrast. CONTRAST:  47mL GADAVIST GADOBUTROL 1 MMOL/ML IV SOLN COMPARISON:  No prior abdominal MRI. CT the abdomen and pelvis 11/13/2019. FINDINGS: Comment: Portions of today's study is significantly limited by patient respiratory motion. Lower chest: Small bilateral pleural effusions lying dependently. Signal intensity in the dependent portions of the lower lobes of the lungs bilaterally, likely to reflect areas of subsegmental atelectasis. Hepatobiliary: Liver has a slightly nodular contour, indicative of early changes of cirrhosis. On pre gadolinium T2 weighted images there are several somewhat ill-defined areas of mild T2 hyperenhancement scattered throughout the periphery of the hepatic parenchyma, largest of which is between segments 4A and 4B (axial image 17 of series 6) measuring 4.0 x 1.8 cm. These lesions have no definite correlate on pre gadolinium T1 weighted images. However, some of these lesions demonstrate mild hyperintensity on diffusion-weighted sequences. Post gadolinium imaging demonstrates heterogeneous hyper perfusion throughout much of the periphery of the liver, without a discrete well-defined hypervascular mass demonstrating central washout and enhancing capsule. No intra or extrahepatic biliary ductal dilatation.  Multiple filling defects are noted in the dependent portion of the gallbladder measuring up to 9 mm,  compatible with gallstones. No gallbladder wall thickening or gallbladder distention to suggest an acute cholecystitis at this time. There is a trace amount of pericholecystic fluid, however, this appears to be related to a small volume of ascites adjacent to the liver. Pancreas: No pancreatic mass. No pancreatic ductal dilatation. No pancreatic or peripancreatic fluid collections or inflammatory changes. Spleen:  Unremarkable. Adrenals/Urinary Tract: Subcentimeter T1 hypointense, T2 hyperintense, nonenhancing lesion in the lower pole of the right kidney is compatible with a simple cyst. Left kidney and bilateral adrenal glands are normal in appearance. No hydroureteronephrosis in the visualized portions of the abdomen. Stomach/Bowel: Visualized portions are unremarkable. Vascular/Lymphatic: Signal void in the distal abdominal aorta and proximal iliac vessels related to aorto bi-iliac stent. Enlarged lymph nodes in the hepatic duodenal ligament measuring up to 1.7 cm in short axis (axial image 41 of series 13) and in the portacaval nodal station measuring 1.3 cm in short axis (axial image 48 of series 13). Other:  Trace volume of ascites. Musculoskeletal: No aggressive appearing osseous lesions are noted in the visualized portions of the skeleton. IMPRESSION: 1. Morphologic changes in the liver compatible with underlying cirrhosis. There are multiple ill-defined areas in the periphery of the liver which demonstrate heterogeneous signal intensity and perfusion characteristics, as discussed above. The largest of these areas measures up to 4.0 x 1.8 cm between segments 4A and 4B and is categorized as LI-RADS 3 based on today's findings. Repeat abdominal MRI with and without IV gadolinium is recommended in 6 months to ensure the stability of these findings. 2. Trace volume of ascites. 3. Small bilateral pleural effusions lying dependently. 4. Cholelithiasis. Electronically Signed   By: Vinnie Langton M.D.   On:  11/15/2019 07:41      Scheduled Meds: . vitamin C  500 mg Oral Daily  . cholecalciferol  5,000 Units Oral Daily  . docusate sodium  100 mg Oral Daily  . ferrous sulfate  325 mg Oral BID WC  . furosemide  40 mg Intravenous Daily  . gabapentin  1,200 mg Oral TID  . insulin aspart  0-15 Units Subcutaneous TID PC & HS  . metoprolol tartrate  25 mg Oral BID  . multivitamin with minerals  1 tablet Oral Daily  . nystatin   Topical BID  . pantoprazole  20 mg Oral Daily  . pneumococcal 23 valent vaccine  0.5 mL Intramuscular Tomorrow-1000   Continuous Infusions: . sodium chloride 10 mL/hr at 11/13/19 1510  . cefTRIAXone (ROCEPHIN)  IV 2 g (11/14/19 2231)  . metronidazole 500 mg (11/15/19 [id number])  . vancomycin 1,250 mg (11/15/19 0226)     LOS: 4 days      Debbe Odea, MD [facility] Pager: [email or web address] 11/15/2019, 11:12 AM

## 2019-11-15 NOTE — Progress Notes (Signed)
Physical Therapy Treatment Patient Details Name: [name]. MRN: [id number] DOB: September 28, 1955 Today's Date: 11/15/2019    History of Present Illness Pt admitted for sepsis secondary to wound infection and cellulitis. Pt is now s/p I&D of R residual limb on 11/12/19. 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 Other PMH includes expressive aphasia s/p L PCA CVA, Afib, and GERD.    PT Comments    PT/OT co-treat 2/2 to pt's case complexity and requiring +2 assistance with mobility. Pt continues to be confused throughout session and yells out in pain however unable to state where pain is located. He was premedicated prior to session. Attempted EOB sitting however pt unable to achieve full upright sitting. Pain and cognition limit progression of session. + 2 assist to attempt L side EOB sitting. Pt was able to progress BLEs off EOB however heavy L lateral lean on LUE. Pt refuses to let go of bed rails and fully sit upright even with max assist + cueing. HR elevated to 150s and pt was repositioned supine in bed with HOB elevated. Spouse present throughout session and does not feel she will be able to safely take pt home. PT recommends DC to SNF when pt is medically stable. Acute PT will continue to progress pt as able per POC. He will benefit from skilled PT at SNF to address safety, balance, and overall safe functional mobility deficits.    Follow Up Recommendations  SNF     Equipment Recommendations  None recommended by PT    Recommendations for Other Services       Precautions / Restrictions Precautions Precautions: Fall Restrictions Weight Bearing Restrictions: Yes RLE Weight Bearing: Non weight bearing    Mobility  Bed Mobility Overal bed mobility: Needs Assistance Bed Mobility: Supine to Sit;Sit to Supine     Supine to sit: Max assist;+2 for physical assistance Sit to supine: Max assist;+2 for physical assistance   General bed mobility  comments: unable to come to full sit secondary to pain/HR elevation this date.  Transfers                 General transfer comment: Deferred.  Ambulation/Gait                 Stairs             Wheelchair Mobility    Modified Rankin (Stroke Patients Only)       Balance Overall balance assessment: Needs assistance Sitting-balance support: Feet supported;Bilateral upper extremity supported Sitting balance-Leahy Scale: Poor Sitting balance - Comments: Pt is unable to come to full sitting position at EOB this date. He grips tightly onto bed rails to his left side and resists assistance from therapists yelling/moaning throughout mobility attempts.       Standing balance comment: NT                            Cognition Arousal/Alertness: Awake/alert Behavior During Therapy: Restless;Agitated;Anxious Overall Cognitive Status: Impaired/Different from baseline Area of Impairment: Orientation;Following commands;Safety/judgement                 Orientation Level: Disoriented to;Place;Situation;Time     Following Commands: Follows one step commands inconsistently       General Comments: Pt awake/alert, but consistently moaning/yelling out in pain. Is unable to state place/time or localize pain. Follows VCs with maximal multimodal prompting. Answers to his name, but otherwise disoriented to  place, time, situation. States "don't leave me baby" multiple times to wife/therapists. Wife at bedside reports cognition is improved from previous date, but pt still not at baseline level of cognition.      Exercises Other Exercises Other Exercises: Pt education limited due to increased pain/impaired cogntion this date. Evaluation information obtained from pt caregiver at bedside.    General Comments General comments (skin integrity, edema, etc.): Pt HR monitored via tele monitor in room. With mobility pt HR noted to reach 150's with patient endorsing  significant pain. Further mobility attempts deferred and pt returned to supine. HR noted to return to [facility] 110's by end of session. SpO2 monitored and noted to remain WFLs 97% on RA. RN notified.      Pertinent Vitals/Pain Pain Assessment: Faces Pain Score: (did not rate) Faces Pain Scale: Hurts whole lot Pain Location: "all over" Pain Descriptors / Indicators: Moaning;Grimacing;Guarding;Restless Pain Intervention(s): Limited activity within patient's tolerance;Premedicated before session;Repositioned    Home Living Family/patient expects to be discharged to:: Private residence Living Arrangements: Spouse/significant other Available Help at Discharge: Family Type of Home: House Home Access: Stairs to enter Entrance Stairs-Rails: Right;Left;Can reach both Home Layout: One level Home Equipment: Environmental consultant - 2 wheels;Bedside commode;Crutches;Wheelchair - manual      Prior Function Level of Independence: Needs assistance  Gait / Transfers Assistance Needed: mostly WC in house, performed modI stand pivot transfers adn scoot transferrs PTA. ADL's / Homemaking Assistance Needed: Pt was able to perform most BADLs with some supv for safety from spouse to t/f into shower. Wife encourages pt to dress himself, reports helping very occasionally only only on severe pain days or if in a hurry to get to an appointment. Comments: able to use B UE and feet to self propel WC.   PT Goals (current goals can now be found in the care plan section) Acute Rehab PT Goals Patient Stated Goal: To have less pain Progress towards PT goals: Not progressing toward goals - comment(pain/cognition deficits limiting progression)    Frequency    Min 2X/week      PT Plan Current plan remains appropriate    Co-evaluation   Reason for Co-Treatment: Necessary to address cognition/behavior during functional activity;For patient/therapist safety;To address functional/ADL transfers PT goals addressed during session:  Mobility/safety with mobility;Balance;Strengthening/ROM OT goals addressed during session: ADL's and self-care;Proper use of Adaptive equipment and DME      AM-PAC PT "6 Clicks" Mobility   Outcome Measure  Help needed turning from your back to your side while in a flat bed without using bedrails?: A Lot Help needed moving from lying on your back to sitting on the side of a flat bed without using bedrails?: A Lot Help needed moving to and from a bed to a chair (including a wheelchair)?: A Lot Help needed standing up from a chair using your arms (e.g., wheelchair or bedside chair)?: Total Help needed to walk in hospital room?: Total Help needed climbing 3-5 steps with a railing? : Total 6 Click Score: 9    End of Session   Activity Tolerance: Patient limited by pain Patient left: in bed;with bed alarm set Nurse Communication: Mobility status PT Visit Diagnosis: Difficulty in walking, not elsewhere classified (R26.2);Other abnormalities of gait and mobility (R26.89);Muscle weakness (generalized) (M62.81)     Time: [id number] PT Time Calculation (min) (ACUTE ONLY): 28 min  Charges:  $Therapeutic Activity: 23-37 mins  Julaine Fusi PTA 11/15/19, 12:02 PM

## 2019-11-16 DIAGNOSIS — T8740 Infection of amputation stump, unspecified extremity: Secondary | ICD-10-CM

## 2019-11-16 DIAGNOSIS — E1110 Type 2 diabetes mellitus with ketoacidosis without coma: Secondary | ICD-10-CM

## 2019-11-16 LAB — CULTURE, BLOOD (ROUTINE X 2)
Culture: NO GROWTH
Culture: NO GROWTH

## 2019-11-16 LAB — BASIC METABOLIC PANEL
Anion gap: 9 (ref 5–15)
BUN: 13 mg/dL (ref 8–23)
CO2: 29 mmol/L (ref 22–32)
Calcium: 8.4 mg/dL — ABNORMAL LOW (ref 8.9–10.3)
Chloride: 98 mmol/L (ref 98–111)
Creatinine, Ser: 0.53 mg/dL — ABNORMAL LOW (ref 0.61–1.24)
GFR calc Af Amer: >60 mL/min (ref >60)
GFR calc non Af Amer: >60 mL/min (ref >60)
Glucose, Bld: 204 mg/dL — ABNORMAL HIGH (ref 70–99)
Potassium: 3 mmol/L — ABNORMAL LOW (ref 3.5–5.1)
Sodium: 136 mmol/L (ref 135–145)

## 2019-11-16 LAB — CBC
HCT: 30.2 % — ABNORMAL LOW (ref 39.0–52.0)
Hemoglobin: 9.2 g/dL — ABNORMAL LOW (ref 13.0–17.0)
MCH: 25.3 pg — ABNORMAL LOW (ref 26.0–34.0)
MCHC: 30.5 g/dL (ref 30.0–36.0)
MCV: 83.2 fL (ref 80.0–100.0)
Platelets: 60 10*3/uL — ABNORMAL LOW (ref 150–400)
RBC: 3.63 MIL/uL — ABNORMAL LOW (ref 4.22–5.81)
RDW: 19.1 % — ABNORMAL HIGH (ref 11.5–15.5)
WBC: 10.9 10*3/uL — ABNORMAL HIGH (ref 4.0–10.5)
nRBC: 0.6 % — ABNORMAL HIGH (ref 0.0–0.2)

## 2019-11-16 LAB — GLUCOSE, CAPILLARY
Glucose-Capillary: 133 mg/dL — ABNORMAL HIGH (ref 70–99)
Glucose-Capillary: 183 mg/dL — ABNORMAL HIGH (ref 70–99)
Glucose-Capillary: 158 mg/dL — ABNORMAL HIGH (ref 70–99)
Glucose-Capillary: 130 mg/dL — ABNORMAL HIGH (ref 70–99)

## 2019-11-16 MED ORDER — SODIUM CHLORIDE 0.9 % IV SOLN
1.5000 g | Freq: Four times a day (QID) | INTRAVENOUS | Status: DC
  Administered 2019-11-16 – 2019-11-20 (×14): 1.5 g via INTRAVENOUS
  Filled 2019-11-16: qty 1.5
  Filled 2019-11-16 (×7): qty 4
  Filled 2019-11-16: qty 1.5
  Filled 2019-11-16: qty 4
  Filled 2019-11-16 (×5): qty 1.5
  Filled 2019-11-16: qty 4
  Filled 2019-11-16 (×2): qty 1.5

## 2019-11-16 MED ORDER — METOPROLOL TARTRATE 50 MG PO TABS
50.0000 mg | ORAL_TABLET | Freq: Two times a day (BID) | ORAL | Status: DC
  Administered 2019-11-16 – 2019-11-29 (×25): 50 mg via ORAL
  Filled 2019-11-16 (×21): qty 1
  Filled 2019-11-16: qty 2
  Filled 2019-11-16 (×4): qty 1

## 2019-11-16 NOTE — Progress Notes (Signed)
Physical Therapy Treatment Patient Details Name: [name]. MRN: [id number] DOB: 02-14-1956 Today's Date: 11/16/2019    History of Present Illness Pt admitted for sepsis secondary to wound infection and cellulitis. Pt is now s/p I&D of R residual limb on 11/12/19. 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 Other PMH includes expressive aphasia s/p L PCA CVA, Afib, and GERD.    PT Comments    Pt was long sitting in bed upon arriving with spouse present at bedside. Per RN, pt has been pre-medicated and HR has been more controlled this date. Previous session pt's HR elevated to 150s but this session was stable and below 115 bpm throughout. He states he has severe pain but unable to state pain location. Pt cognition is different from baseline per spouse. He is oriented to self only. He did agree to trial EOB sitting. Max assist of one to achieve full upright EOB sitting. Was unable to tolerate this previous date. He sat EOB x ten minutes with L lateral lean. With cueing and min assist was able to correct. He was repositioned back supine in bed with max assist + vcs for sequencing. Overall pt tolerated session well but continues to be limited by pain/cognition. PT recommends DC to SNF when stable. If pt demonstrates improved ability to tolerate therapy, may be CIR candidate. Acute PT will continue to progress as able per POC.     Follow Up Recommendations  SNF     Equipment Recommendations  None recommended by PT    Recommendations for Other Services       Precautions / Restrictions Precautions Precautions: Fall Restrictions Weight Bearing Restrictions: Yes RLE Weight Bearing: Non weight bearing    Mobility  Bed Mobility Overal bed mobility: Needs Assistance Bed Mobility: Supine to Sit;Sit to Supine     Supine to sit: Max assist;HOB elevated Sit to supine: Max assist;HOB elevated   General bed mobility comments: pt was able to sit fully  upright EOB this date. Increased time + max assist of one. He yells out in pain much less today but does continue to yell out at times. Max assist 2/2 to pain and pt's participation level.   Transfers                 General transfer comment: unsafe to trial this date. will need +2 for safety  Ambulation/Gait                 Stairs             Wheelchair Mobility    Modified Rankin (Stroke Patients Only)       Balance Overall balance assessment: Needs assistance Sitting-balance support: Feet supported;Bilateral upper extremity supported Sitting balance-Leahy Scale: Fair Sitting balance - Comments: pt was able to maintain balance EOB with BUE support. jhe does have L lateral lean but corrected with vcs and min assist. Postural control: Left lateral lean                                  Cognition Arousal/Alertness: Lethargic;Suspect due to medications Behavior During Therapy: [facility] for tasks assessed/performed Overall Cognitive Status: Impaired/Different from baseline Area of Impairment: Orientation;Following commands;Safety/judgement                 Orientation Level: Disoriented to;Place;Situation;Time     Following Commands: Follows one step commands consistently Safety/Judgement: Decreased awareness of  safety;Decreased awareness of deficits     General Comments: Pt was alert but lethargic from medication. He required increased time throughout for safety/pain. pt reports severe pain but unable to state where pain was located       Exercises      General Comments        Pertinent Vitals/Pain Pain Assessment: 0-10 Pain Score: 10-Worst pain ever Faces Pain Scale: Hurts whole lot Pain Location: "all over" Pain Descriptors / Indicators: Moaning;Grimacing;Guarding;Restless Pain Intervention(s): Limited activity within patient's tolerance;Monitored during session;Premedicated before session;Repositioned    Home Living                       Prior Function            PT Goals (current goals can now be found in the care plan section) Acute Rehab PT Goals Patient Stated Goal: did not state Progress towards PT goals: Progressing toward goals    Frequency    Min 2X/week      PT Plan Current plan remains appropriate    Co-evaluation     PT goals addressed during session: Mobility/safety with mobility        AM-PAC PT "6 Clicks" Mobility   Outcome Measure  Help needed turning from your back to your side while in a flat bed without using bedrails?: A Lot Help needed moving from lying on your back to sitting on the side of a flat bed without using bedrails?: A Lot Help needed moving to and from a bed to a chair (including a wheelchair)?: A Lot Help needed standing up from a chair using your arms (e.g., wheelchair or bedside chair)?: Total Help needed to walk in hospital room?: Total Help needed climbing 3-5 steps with a railing? : Total 6 Click Score: 9    End of Session Equipment Utilized During Treatment: Gait belt Activity Tolerance: Patient limited by pain Patient left: in bed;with bed alarm set Nurse Communication: Mobility status PT Visit Diagnosis: Difficulty in walking, not elsewhere classified (R26.2);Other abnormalities of gait and mobility (R26.89);Muscle weakness (generalized) (M62.81)     Time: [id number] PT Time Calculation (min) (ACUTE ONLY): 19 min  Charges:  $Therapeutic Activity: 8-22 mins                     Julaine Fusi PTA 11/16/19, 4:50 PM

## 2019-11-16 NOTE — Progress Notes (Addendum)
PROGRESS NOTE    Glenn B Huot Jr.   VX:[id number]  DOB: 02/14/1956  DOA: 11/10/2019 PCP: [name], DO   Brief Narrative:  Glenn Gerold. is a 64 y.o. 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 He was started on IV antibiotics.  Also noted was that he was in A-fib with RVR for which he was started on a Cardizem infusion.   Subjective: No physican complaints. States he feels great. Yelling at his wife when she is suggesting SNF and states he will not go to rehab.     Assessment & Plan:   Active Problems:   Sepsis with right stump infection after AKA - appreciate ID and vascular surgery assistance- cont Abx per iD - s/p I and D - cont wound vac with changes on MWF  Lethargy, confusion - per wife, he is much better today- will d/c Xanax which he does not take at home and he received on the evening of the 13th - CT head yesterday ordered by vascular-neg for new CVA  PAD - s/p angioplasty and multiple stents  Cirrhosis - per wife he has a h/o ETOH abuse but he has stopped drinking completely - only trace ascites on MRI  Hep B surface Ag + and E Ab + - no symptoms of hepatitis- ID recommended obtained Hep B DNR which I ordered on 4/15- it is in process  Multiple liver lesions - cont outpatient surveillance of these with serial MRI  - AFP < 0.9  Permanent A-Fib - on Metoprolol - HR rapid - increase to 50 BID - Eliquis on hold as plt quite low    Thrombocytopenia - possibly related to cirrhosis and maybe also sepsis - platelets 31 > now 60- - holding Eliquis - hopefully can resume tomorrow follow   DM2 - on Jardiance as outpt- a1C IS 7.7 - cont SSI  Time spent in minutes: 35 DVT prophylaxis: holding Code Status: Full  code Family Communication: wife  Disposition Plan:  From home- not ready to d/c due to acute infection- awaiting final recommendations from ID who has not put in a note yet today - will need SNF but refusing- not able to transfer on his own yet Consultants:   ID  Vascular surgery Procedures:   I and D Antimicrobials:  Anti-infectives (From admission, onward)   Start     Dose/Rate Route Frequency Ordered Stop   11/13/19 2200  cefTRIAXone (ROCEPHIN) 2 g in sodium chloride 0.9 % 100 mL IVPB     2 g 200 mL/hr over 30 Minutes Intravenous Every 24 hours 11/13/19 2007     11/13/19 2200  metroNIDAZOLE (FLAGYL) IVPB 500 mg     500 mg 100 mL/hr over 60 Minutes Intravenous Every 8 hours 11/13/19 2007     11/11/19 1400  vancomycin (VANCOREADY) IVPB 1250 mg/250 mL     1,250 mg 166.7 mL/hr over 90 Minutes Intravenous Every 12 hours 11/11/19 0158     11/11/19 0600  piperacillin-tazobactam (ZOSYN) IVPB 3.375 g  Status:  Discontinued     3.375 g 12.5 mL/hr over 240 Minutes Intravenous Every 8 hours 11/11/19 0322 11/13/19 2007   11/11/19 0215  vancomycin (VANCOCIN) IVPB 1000 mg/200 mL premix  Status:  Discontinued  1,000 mg 200 mL/hr over 60 Minutes Intravenous  Once 11/11/19 0214 11/11/19 0321   11/11/19 0215  piperacillin-tazobactam (ZOSYN) IVPB 3.375 g  Status:  Discontinued     3.375 g 100 mL/hr over 30 Minutes Intravenous Every 6 hours 11/11/19 0214 11/11/19 0321   11/11/19 0015  vancomycin (VANCOCIN) IVPB 1000 mg/200 mL premix     1,000 mg 200 mL/hr over 60 Minutes Intravenous  Once 11/11/19 0010 11/11/19 0252   11/10/19 2345  vancomycin (VANCOCIN) IVPB 1000 mg/200 mL premix     1,000 mg 200 mL/hr over 60 Minutes Intravenous  Once 11/10/19 2342 11/11/19 0127   11/10/19 2345  cefTRIAXone (ROCEPHIN) 2 g in sodium chloride 0.9 % 100 mL IVPB     2 g 200 mL/hr over 30 Minutes Intravenous  Once 11/10/19 2342 11/11/19 0131       Objective: Vitals:   11/15/19 1921 11/15/19 2145  11/16/19 0514 11/16/19 0744  BP: 124/78 131/75 115/67 126/77  Pulse: (!) 101 97 100 (!) 108  Resp:    18  Temp: 98.7 F (37.1 C)  98.5 F (36.9 C) 98.6 F (37 C)  TempSrc: Oral  Oral   SpO2: 98%  97% 97%  Weight:   93.4 kg   Height:        Intake/Output Summary (Last 24 hours) at 11/16/2019 0815 Last data filed at 11/16/2019 0131 Gross per 24 hour  Intake --  Output 0 ml  Net 0 ml   Filed Weights   11/14/19 0439 11/15/19 0421 11/16/19 0514  Weight: 94.7 kg 92.5 kg 93.4 kg    Examination: General exam: Appears comfortable - slightly sleepy today HEENT: PERRLA, oral mucosa moist, no sclera icterus or thrush Respiratory system: Clear to auscultation. Respiratory effort normal. Cardiovascular system: S1 & S2 heard,  No murmurs  Gastrointestinal system: Abdomen soft, non-tender, nondistended. Normal bowel sounds   Central nervous system: Alert and oriented. No focal neurological deficits. Extremities: No cyanosis, clubbing or- mild edema left leg- right stump dressing not opened Skin: No rashes or ulcers Psychiatry:  Mood & affect appropriate.     Data Reviewed: I have personally reviewed following labs and imaging studies  CBC: Recent Labs  Lab 11/10/19 2357 11/10/19 2357 11/11/19 0352 11/12/19 0855 11/13/19 0349 11/14/19 0356 11/15/19 0623  WBC 12.8*   < > 14.1* 14.1* 18.9* 13.6* 13.6*  NEUTROABS 10.1*  --   --   --   --   --   --   HGB 9.5*   < > 8.9* 8.5* 8.6* 9.2* 8.7*  HCT 30.0*   < > 27.5* 27.2* 28.6* 30.3* 28.8*  MCV 80.4   < > 80.6 81.2 85.1 83.5 84.5  PLT 91*   < > 76* 144* 116* 41* 31*   < > = values in this interval not displayed.   Basic Metabolic Panel: Recent Labs  Lab 11/11/19 0352 11/12/19 0855 11/13/19 0349 11/14/19 0356 11/15/19 0623  NA 129* 129* 131* 136 137  K 4.2 4.1 4.8 3.8 3.8  CL 98 96* 98 102 101  CO2 20* 22 23 26 26   GLUCOSE 204* 309* 194* 153* 166*  BUN 21 35* 33* 24* 15  CREATININE 0.75 0.84 0.89 0.77 0.58*  CALCIUM 7.7*  7.8* 7.9* 8.0* 8.1*   GFR: Estimated Creatinine Clearance: 103.9 mL/min (A) (by C-G formula based on SCr of 0.58 mg/dL (L)). Liver Function Tests: Recent Labs  Lab 11/10/19 2357 11/13/19 0349 11/14/19 0356 11/15/19 0623  AST 359* 201*  96* 63*  ALT 280* 195* 138* 94*  ALKPHOS 190* 186* 181* 160*  BILITOT 2.2* 1.8* 1.4* 2.2*  PROT 7.2 6.3* 6.3* 5.8*  ALBUMIN 2.5* 2.3* 2.3* 2.2*   Recent Labs  Lab 11/10/19 2357  LIPASE 16   No results for input(s): AMMONIA in the last 168 hours. Coagulation Profile: Recent Labs  Lab 11/10/19 2357 11/13/19 0723  INR 1.9* 2.8*   Cardiac Enzymes: No results for input(s): CKTOTAL, CKMB, CKMBINDEX, TROPONINI in the last 168 hours. BNP (last 3 results) No results for input(s): PROBNP in the last 8760 hours. HbA1C: No results for input(s): HGBA1C in the last 72 hours. CBG: Recent Labs  Lab 11/15/19 0749 11/15/19 1214 11/15/19 1613 11/15/19 2104 11/16/19 0742  GLUCAP 148* 170* 166* 137* 133*   Lipid Profile: No results for input(s): CHOL, HDL, LDLCALC, TRIG, CHOLHDL, LDLDIRECT in the last 72 hours. Thyroid Function Tests: No results for input(s): TSH, T4TOTAL, FREET4, T3FREE, THYROIDAB in the last 72 hours. Anemia Panel: No results for input(s): VITAMINB12, FOLATE, FERRITIN, TIBC, IRON, RETICCTPCT in the last 72 hours. Urine analysis:    Component Value Date/Time   COLORURINE AMBER (A) 11/10/2019 2357   APPEARANCEUR HAZY (A) 11/10/2019 2357   APPEARANCEUR Clear 07/03/2019 1513   LABSPEC 1.022 11/10/2019 2357   PHURINE 5.0 11/10/2019 2357   GLUCOSEU >=500 (A) 11/10/2019 2357   HGBUR SMALL (A) 11/10/2019 2357   BILIRUBINUR NEGATIVE 11/10/2019 2357   BILIRUBINUR Negative 07/03/2019 1513   KETONESUR 20 (A) 11/10/2019 2357   PROTEINUR 30 (A) 11/10/2019 2357   NITRITE NEGATIVE 11/10/2019 2357   LEUKOCYTESUR NEGATIVE 11/10/2019 2357   Sepsis Labs: @LABRCNTIP (procalcitonin:4,lacticidven:4) ) Recent Results (from the past 240  hour(s))  Blood Culture (routine x 2)     Status: None   Collection Time: 11/10/19 11:57 PM   Specimen: BLOOD  Result Value Ref Range Status   Specimen Description BLOOD BLOOD LEFT HAND  Final   Special Requests   Final    BOTTLES DRAWN AEROBIC AND ANAEROBIC Blood Culture results may not be optimal due to an excessive volume of blood received in culture bottles   Culture   Final    NO GROWTH 5 DAYS Performed at [facility], [address] [place], [place] [address]    Report Status 11/16/2019 FINAL  Final  Blood Culture (routine x 2)     Status: None   Collection Time: 11/10/19 11:57 PM   Specimen: BLOOD  Result Value Ref Range Status   Specimen Description BLOOD BLOOD RIGHT HAND  Final   Special Requests   Final    BOTTLES DRAWN AEROBIC AND ANAEROBIC Blood Culture results may not be optimal due to an inadequate volume of blood received in culture bottles   Culture   Final    NO GROWTH 5 DAYS Performed at [facility], [address], [place], [place] [address]    Report Status 11/16/2019 FINAL  Final  Urine culture     Status: Abnormal   Collection Time: 11/10/19 11:57 PM   Specimen: In/Out Cath Urine  Result Value Ref Range Status   Specimen Description   Final    IN/OUT CATH URINE Performed at [facility], [address], [place], [place] [address]    Special Requests   Final    NONE Performed at [facility], [address] [place], [place] [address]    Culture MULTIPLE SPECIES PRESENT, SUGGEST RECOLLECTION (A)  Final   Report Status 11/12/2019 FINAL  Final  Aerobic/Anaerobic Culture (surgical/deep wound)     Status: Abnormal (Preliminary result)   Collection Time: 11/10/19 11:57 PM   Specimen: Leg; Wound  Result Value Ref Range Status   Specimen Description   Final    LEG RIGHT Performed at [facility], [address] [place], [place] [address]    Special Requests   Final    Normal Performed at [facility], [address] [place], [place] [address]    Gram Stain   Final    NO WBC SEEN MODERATE GRAM POSITIVE RODS Performed at [facility], [place] [address], [place], [place] [address]    Culture (A)  Final    MULTIPLE ORGANISMS PRESENT, NONE PREDOMINANT NO ANAEROBES ISOLATED; CULTURE IN PROGRESS FOR 5 DAYS    Report Status PENDING  Incomplete  Respiratory Panel by RT PCR (Flu A&B, Covid) - Nasopharyngeal Swab     Status: None   Collection Time: 11/11/19  1:46 AM   Specimen: Nasopharyngeal Swab  Result Value Ref Range Status   SARS Coronavirus 2 by RT PCR NEGATIVE NEGATIVE Final    Comment: (NOTE) SARS-CoV-2 target nucleic acids are NOT DETECTED. The SARS-CoV-2 RNA is generally detectable in upper respiratoy specimens during the acute phase of infection. The lowest concentration of SARS-CoV-2 viral copies this assay can detect is 131 copies/mL. A negative result does not preclude SARS-Cov-2 infection and should not be used as the sole basis for treatment or other patient management decisions. A negative result may occur with  improper specimen collection/handling, submission of specimen other than nasopharyngeal swab, presence of viral mutation(s) within the areas targeted by this assay, and inadequate number of viral copies (<131 copies/mL). A negative result must be combined with clinical observations, patient history, and epidemiological information. The expected result is Negative. Fact Sheet for Patients:  PinkCheek.be Fact Sheet for Healthcare Providers:  GravelBags.it This test is not yet ap proved or cleared by the [place] FDA and  has been authorized for detection and/or diagnosis of SARS-CoV-2 by FDA under an Emergency Use Authorization (EUA). This EUA will remain  in effect (meaning this test can be used) for the duration of the COVID-19 declaration under Section 564(b)(1) of the Act, 21  U.S.C. section 360bbb-3(b)(1), unless the authorization is terminated or revoked sooner.    Influenza A by PCR NEGATIVE NEGATIVE Final   Influenza B by PCR NEGATIVE NEGATIVE Final    Comment: (NOTE) The Xpert Xpress SARS-CoV-2/FLU/RSV assay is intended as an aid in  the diagnosis of influenza from Nasopharyngeal swab specimens and  should not be used as a sole basis for treatment. Nasal washings and  aspirates are unacceptable for Xpert Xpress SARS-CoV-2/FLU/RSV  testing. Fact Sheet for Patients: PinkCheek.be Fact Sheet for Healthcare Providers: GravelBags.it This test is not yet approved or cleared by the [place] FDA and  has been authorized for detection and/or diagnosis of SARS-CoV-2 by  FDA under an Emergency Use Authorization (EUA). This EUA will remain  in effect (meaning this test can be used) for the duration of the  Covid-19 declaration under Section 564(b)(1) of the Act, 21  U.S.C. section 360bbb-3(b)(1), unless the authorization is  terminated or revoked. Performed at [facility], [address] [place], [place] [address]   Aerobic/Anaerobic Culture (surgical/deep wound)     Status: None (Preliminary result)   Collection Time: 11/11/19  4:44 PM   Specimen: PATH Amputaion Arm/Leg; Tissue  Result Value Ref Range Status  Specimen Description   Final    WOUND Performed at [facility], [address] [place], [place] [address]    Special Requests   Final    NONE Performed at [facility], [place], [place] [address]    Gram Stain   Final    FEW WBC PRESENT, PREDOMINANTLY PMN FEW GRAM POSITIVE COCCI IN CLUSTERS RARE GRAM NEGATIVE RODS Performed at [facility], [place] [address], [place], [place] [address]    Culture   Final    FEW STAPHYLOCOCCUS AUREUS MODERATE ENTEROCOCCUS FAECALIS SUSCEPTIBILITIES TO FOLLOW NO ANAEROBES ISOLATED; CULTURE IN  PROGRESS FOR 5 DAYS    Report Status PENDING  Incomplete         Radiology Studies: CT HEAD WO CONTRAST  Result Date: 11/15/2019 CLINICAL DATA:  Altered mental status EXAM: CT HEAD WITHOUT CONTRAST TECHNIQUE: Contiguous axial images were obtained from the base of the skull through the vertex without intravenous contrast. COMPARISON:  2018 FINDINGS: Brain: There is no acute intracranial hemorrhage, mass effect, or edema. No hydrocephalus or extra-axial fluid collection. No new loss of gray-white differentiation. There are chronic left frontal, parietal, occipital, and cerebellar infarcts. This has progressed since 2018. Additional patchy hypoattenuation in the supratentorial white matter is nonspecific but probably reflects mild to moderate chronic microvascular ischemic changes. There is ex vacuo dilatation of the left lateral ventricle. Vascular: There is atherosclerotic calcification at the skull base. Skull: Calvarium is unremarkable. Sinuses/Orbits: No acute finding. Other: None. IMPRESSION: No acute intracranial abnormality. Multiple chronic left-sided infarcts have progressed since 2018. Chronic microvascular ischemic changes. Electronically Signed   By: Macy Mis M.D.   On: 11/15/2019 14:24   MR LIVER W WO CONTRAST  Result Date: 11/15/2019 CLINICAL DATA:  64 year old male with history of hepatic cirrhosis and history of hepatitis B infection. Evaluate for potential liver lesion. EXAM: MRI ABDOMEN WITHOUT AND WITH CONTRAST TECHNIQUE: Multiplanar multisequence MR imaging of the abdomen was performed both before and after the administration of intravenous contrast. CONTRAST:  45mL GADAVIST GADOBUTROL 1 MMOL/ML IV SOLN COMPARISON:  No prior abdominal MRI. CT the abdomen and pelvis 11/13/2019. FINDINGS: Comment: Portions of today's study is significantly limited by patient respiratory motion. Lower chest: Small bilateral pleural effusions lying dependently. Signal intensity in the dependent  portions of the lower lobes of the lungs bilaterally, likely to reflect areas of subsegmental atelectasis. Hepatobiliary: Liver has a slightly nodular contour, indicative of early changes of cirrhosis. On pre gadolinium T2 weighted images there are several somewhat ill-defined areas of mild T2 hyperenhancement scattered throughout the periphery of the hepatic parenchyma, largest of which is between segments 4A and 4B (axial image 17 of series 6) measuring 4.0 x 1.8 cm. These lesions have no definite correlate on pre gadolinium T1 weighted images. However, some of these lesions demonstrate mild hyperintensity on diffusion-weighted sequences. Post gadolinium imaging demonstrates heterogeneous hyper perfusion throughout much of the periphery of the liver, without a discrete well-defined hypervascular mass demonstrating central washout and enhancing capsule. No intra or extrahepatic biliary ductal dilatation. Multiple filling defects are noted in the dependent portion of the gallbladder measuring up to 9 mm, compatible with gallstones. No gallbladder wall thickening or gallbladder distention to suggest an acute cholecystitis at this time. There is a trace amount of pericholecystic fluid, however, this appears to be related to a small volume of ascites adjacent to the liver. Pancreas: No pancreatic mass. No pancreatic ductal dilatation. No pancreatic or peripancreatic fluid collections or  inflammatory changes. Spleen:  Unremarkable. Adrenals/Urinary Tract: Subcentimeter T1 hypointense, T2 hyperintense, nonenhancing lesion in the lower pole of the right kidney is compatible with a simple cyst. Left kidney and bilateral adrenal glands are normal in appearance. No hydroureteronephrosis in the visualized portions of the abdomen. Stomach/Bowel: Visualized portions are unremarkable. Vascular/Lymphatic: Signal void in the distal abdominal aorta and proximal iliac vessels related to aorto bi-iliac stent. Enlarged lymph nodes in  the hepatic duodenal ligament measuring up to 1.7 cm in short axis (axial image 41 of series 13) and in the portacaval nodal station measuring 1.3 cm in short axis (axial image 48 of series 13). Other:  Trace volume of ascites. Musculoskeletal: No aggressive appearing osseous lesions are noted in the visualized portions of the skeleton. IMPRESSION: 1. Morphologic changes in the liver compatible with underlying cirrhosis. There are multiple ill-defined areas in the periphery of the liver which demonstrate heterogeneous signal intensity and perfusion characteristics, as discussed above. The largest of these areas measures up to 4.0 x 1.8 cm between segments 4A and 4B and is categorized as LI-RADS 3 based on today's findings. Repeat abdominal MRI with and without IV gadolinium is recommended in 6 months to ensure the stability of these findings. 2. Trace volume of ascites. 3. Small bilateral pleural effusions lying dependently. 4. Cholelithiasis. Electronically Signed   By: Vinnie Langton M.D.   On: 11/15/2019 07:41      Scheduled Meds: . vitamin C  500 mg Oral Daily  . cholecalciferol  5,000 Units Oral Daily  . docusate sodium  100 mg Oral Daily  . ferrous sulfate  325 mg Oral BID WC  . furosemide  40 mg Intravenous Daily  . gabapentin  1,200 mg Oral TID  . insulin aspart  0-15 Units Subcutaneous TID PC & HS  . metoprolol tartrate  25 mg Oral BID  . multivitamin with minerals  1 tablet Oral Daily  . nystatin   Topical BID  . pantoprazole  20 mg Oral Daily   Continuous Infusions: . sodium chloride 10 mL/hr at 11/13/19 1510  . cefTRIAXone (ROCEPHIN)  IV 2 g (11/15/19 2157)  . metronidazole 500 mg (11/16/19 0542)  . vancomycin 1,250 mg (11/16/19 0331)     LOS: 5 days      Debbe Odea, MD [facility] Pager: [email or web address] 11/16/2019, 8:15 AM

## 2019-11-16 NOTE — Progress Notes (Signed)
[facility] Daily Progress Note   Subjective: 11/11/19: 1. Irrigation and debridement ofright AKA stump - skin and soft tissue  More alert today. Wife at bedside. No acute issues overnight.   Objective: Vitals:   11/15/19 1921 11/15/19 2145 11/16/19 0514 11/16/19 0744  BP: 124/78 131/75 115/67 126/77  Pulse: (!) 101 97 100 (!) 108  Resp:    18  Temp: 98.7 F (37.1 C)  98.5 F (36.9 C) 98.6 F (37 C)  TempSrc: Oral  Oral   SpO2: 98%  97% 97%  Weight:   93.4 kg   Height:        Intake/Output Summary (Last 24 hours) at 11/16/2019 1031 Last data filed at 11/16/2019 0950 Gross per 24 hour  Intake 240 ml  Output 0 ml  Net 240 ml   Physical Exam: Lethargic, NAD [id number] Pulmonary: CTA Bilaterally Abdomen: Soft, Nontender, Nondistended Vascular: Right Lower Extremity: VAC to suction, without leak. Erythema / swelling to leg with improvement.    Laboratory: CBC    Component Value Date/Time   WBC 10.9 (H) 11/16/2019 0932   HGB 9.2 (L) 11/16/2019 0932   HGB 11.2 (L) 10/23/2019 1403   HCT 30.2 (L) 11/16/2019 0932   HCT 35.0 (L) 10/23/2019 1403   PLT 60 (L) 11/16/2019 0932   PLT 199 10/23/2019 1403   BMET    Component Value Date/Time   NA 136 11/16/2019 0932   NA 140 10/23/2019 1403   K 3.0 (L) 11/16/2019 0932   CL 98 11/16/2019 0932   CO2 29 11/16/2019 0932   GLUCOSE 204 (H) 11/16/2019 0932   BUN 13 11/16/2019 0932   BUN 17 10/23/2019 1403   CREATININE 0.53 (L) 11/16/2019 0932   CALCIUM 8.4 (L) 11/16/2019 0932   GFRNONAA >60 11/16/2019 0932   GFRAA >60 11/16/2019 0932   Assessment/Planning: 64 year old male who presented to the [facility] emergency departments/pright above-the-knee amputation(10/26/19),taken to the [facility] 4/10/21for washout debridement- POD#4  1) s/p right AKA debridement: Status post washout and debridement- POD#4 ORVAC removed on 11/14/19  Next change on Friday @  12:30pm.  Wound care will be able to change VAC M-W-F. Continue 125, continuous.   2) Mental Status: Confusion with some improvement today. CT Head yesterday without acute changes  2) Sepsis: ID following On IV ABX  Discussed with Dr. Ellis Parents Saksham Akkerman PA-C 11/16/2019 10:31 AM

## 2019-11-17 DIAGNOSIS — B957 Other staphylococcus as the cause of diseases classified elsewhere: Secondary | ICD-10-CM

## 2019-11-17 DIAGNOSIS — I96 Gangrene, not elsewhere classified: Secondary | ICD-10-CM

## 2019-11-17 DIAGNOSIS — T8743 Infection of amputation stump, right lower extremity: Secondary | ICD-10-CM | POA: Diagnosis not present

## 2019-11-17 DIAGNOSIS — I998 Other disorder of circulatory system: Secondary | ICD-10-CM | POA: Diagnosis not present

## 2019-11-17 DIAGNOSIS — B954 Other streptococcus as the cause of diseases classified elsewhere: Secondary | ICD-10-CM

## 2019-11-17 DIAGNOSIS — B181 Chronic viral hepatitis B without delta-agent: Secondary | ICD-10-CM

## 2019-11-17 LAB — AEROBIC/ANAEROBIC CULTURE W GRAM STAIN (SURGICAL/DEEP WOUND)
Gram Stain: NONE SEEN
Special Requests: NORMAL

## 2019-11-17 LAB — HEPATITIS B VIRUS (PROFILE VI): Hep B E Ab: POSITIVE — AB

## 2019-11-17 LAB — CBC
HCT: 27.5 % — ABNORMAL LOW (ref 39.0–52.0)
Hemoglobin: 8.3 g/dL — ABNORMAL LOW (ref 13.0–17.0)
MCH: 25.5 pg — ABNORMAL LOW (ref 26.0–34.0)
MCHC: 30.2 g/dL (ref 30.0–36.0)
MCV: 84.6 fL (ref 80.0–100.0)
Platelets: 72 10*3/uL — ABNORMAL LOW (ref 150–400)
RBC: 3.25 MIL/uL — ABNORMAL LOW (ref 4.22–5.81)
RDW: 19.1 % — ABNORMAL HIGH (ref 11.5–15.5)
WBC: 10.6 10*3/uL — ABNORMAL HIGH (ref 4.0–10.5)
nRBC: 0.3 % — ABNORMAL HIGH (ref 0.0–0.2)

## 2019-11-17 LAB — BASIC METABOLIC PANEL
Anion gap: 10 (ref 5–15)
BUN: 12 mg/dL (ref 8–23)
CO2: 28 mmol/L (ref 22–32)
Calcium: 8.2 mg/dL — ABNORMAL LOW (ref 8.9–10.3)
Chloride: 101 mmol/L (ref 98–111)
Creatinine, Ser: 0.57 mg/dL — ABNORMAL LOW (ref 0.61–1.24)
GFR calc Af Amer: >60 mL/min (ref >60)
GFR calc non Af Amer: >60 mL/min (ref >60)
Glucose, Bld: 142 mg/dL — ABNORMAL HIGH (ref 70–99)
Potassium: 3.1 mmol/L — ABNORMAL LOW (ref 3.5–5.1)
Sodium: 139 mmol/L (ref 135–145)

## 2019-11-17 LAB — GLUCOSE, CAPILLARY
Glucose-Capillary: 128 mg/dL — ABNORMAL HIGH (ref 70–99)
Glucose-Capillary: 149 mg/dL — ABNORMAL HIGH (ref 70–99)
Glucose-Capillary: 152 mg/dL — ABNORMAL HIGH (ref 70–99)
Glucose-Capillary: 127 mg/dL — ABNORMAL HIGH (ref 70–99)

## 2019-11-17 LAB — HEPATITIS B DNA, ULTRAQUANTITATIVE, PCR
HBV DNA SERPL PCR-ACNC: 640 IU/mL
HBV DNA SERPL PCR-LOG IU: 2.806 log10 IU/mL

## 2019-11-17 LAB — MAGNESIUM: Magnesium: 1.7 mg/dL (ref 1.7–2.4)

## 2019-11-17 MED ORDER — APIXABAN 5 MG PO TABS
5.0000 mg | ORAL_TABLET | Freq: Two times a day (BID) | ORAL | Status: DC
  Administered 2019-11-17 – 2019-11-29 (×24): 5 mg via ORAL
  Filled 2019-11-17 (×25): qty 1

## 2019-11-17 MED ORDER — POTASSIUM CHLORIDE CRYS ER 20 MEQ PO TBCR
40.0000 meq | EXTENDED_RELEASE_TABLET | ORAL | Status: AC
  Administered 2019-11-17 (×2): 40 meq via ORAL
  Filled 2019-11-17 (×2): qty 2

## 2019-11-17 MED ORDER — LINEZOLID 600 MG PO TABS
600.0000 mg | ORAL_TABLET | Freq: Two times a day (BID) | ORAL | Status: DC
  Administered 2019-11-17 – 2019-11-23 (×11): 600 mg via ORAL
  Filled 2019-11-17 (×14): qty 1

## 2019-11-17 NOTE — Consult Note (Addendum)
[place] Nurse wound follow up Wound type:surgical  Measurement: 16cm x 9cmx 4cm  Wound bed: 95% clean/5% dark some minimal non viable tissue noted along the proximal wound edge from 10-12 o'clock  Drainage (amount, consistency, odor) minimal, serosanguinous in VAC canister Periwound: intact  Dressing procedure/placement/frequency: Removed old NPWT dressing Using saline to moisten VAC sponge prior to removal to attempt to lessen pain Cleansed wound with normal saline Filled wound with  _1__ piece of black foam Sealed NPWT dressing at 138mm HG Patient received IV pain medication per bedside nurse prior to dressing change Patient tolerated procedure well  Infectious disease MD at the bedside to obtain images bc vascular while planning to be here today was delayed in the OR. Images in the chart per Dr. Delaine Lame   [facility] nurse will continue to provide NPWT dressing changed due to the complexity of the dressing change. M/W/F  [place], [place], [place]

## 2019-11-17 NOTE — Progress Notes (Addendum)
PROGRESS NOTE    Glenn B Budzynski Jr.   [id number]  DOB: Dec 22, 1955  DOA: 11/10/2019 PCP: [name], DO   Brief Narrative:  Glenn Gerold. is a 64 y.o. 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 He was started on IV antibiotics.  Also noted was that he was in A-fib with RVR for which he was started on a Cardizem infusion.   Subjective: He has no complaints today.    Assessment & Plan:   Active Problems:   Sepsis with right stump infection after AKA - appreciate ID and vascular surgery assistance- cont Abx per ID - s/p I and D - cont wound vac with changes on MWF - will discuss with Dr Lucky Cowboy about resuming Eliquis today - Addendum: OK to resume Eliquis per Dr Lucky Cowboy- I have resumed it  Hypokalemia - replacing - recheck tomorrow- f/u Mg today  Lethargy, confusion (4/14/ 4/15) - per wife, he is much better today- will d/c Xanax which he does not take at home and he received on the evening of the 13th - CT head yesterday ordered by vascular-neg for new CVA  PAD - s/p angioplasty and multiple stents  Cirrhosis - per wife he has a h/o ETOH abuse but he has stopped drinking completely - only trace ascites on MRI  Hep B surface Ag + and E Ab + - no symptoms of hepatitis- ID recommended obtained Hep B DNR which I ordered on 4/15- it is in process - I spoke with Dr Vicente Males who recommended outpt follow up (the patient's wife states he is his hepatologist)  Multiple liver lesions - cont outpatient surveillance of these with serial MRI  - AFP < 0.9  Permanent A-Fib - on Metoprolol - HR rapid - increase to 50 BID - Eliquis was on hold as plt quite low (31) - now resuming   Thrombocytopenia - possibly related to cirrhosis and maybe also  sepsis - platelets 31 > 60 > 72- cautiously resume Eliquis -   DM2 - on Jardiance as outpt- a1C IS 7.7 - cont SSI  Time spent in minutes: 35 DVT prophylaxis: holding Code Status: Full code Family Communication: wife  Disposition Plan:  From home- not ready to d/c due to acute infection- awaiting final recommendations from ID and vascular - will need SNF - TOC consulted on 4/15 Consultants:   ID  Vascular surgery Procedures:   I and D Antimicrobials:  Anti-infectives (From admission, onward)   Start     Dose/Rate Route Frequency Ordered Stop   11/16/19 1800  ampicillin-sulbactam (UNASYN) 1.5 g in sodium chloride 0.9 % 100 mL IVPB     1.5 g 200 mL/hr over 30 Minutes Intravenous Every 6 hours 11/16/19 1418     11/13/19 2200  cefTRIAXone (ROCEPHIN) 2 g in sodium chloride 0.9 % 100 mL IVPB  Status:  Discontinued     2 g 200 mL/hr over 30 Minutes Intravenous Every 24 hours 11/13/19 2007 11/16/19 1418   11/13/19 2200  metroNIDAZOLE (FLAGYL) IVPB 500 mg  Status:  Discontinued     500 mg 100 mL/hr over 60 Minutes Intravenous Every 8 hours 11/13/19 2007 11/16/19 1418   11/11/19 1400  vancomycin (VANCOREADY) IVPB 1250 mg/250 mL     1,250 mg  166.7 mL/hr over 90 Minutes Intravenous Every 12 hours 11/11/19 0158     11/11/19 0600  piperacillin-tazobactam (ZOSYN) IVPB 3.375 g  Status:  Discontinued     3.375 g 12.5 mL/hr over 240 Minutes Intravenous Every 8 hours 11/11/19 0322 11/13/19 2007   11/11/19 0215  vancomycin (VANCOCIN) IVPB 1000 mg/200 mL premix  Status:  Discontinued     1,000 mg 200 mL/hr over 60 Minutes Intravenous  Once 11/11/19 0214 11/11/19 0321   11/11/19 0215  piperacillin-tazobactam (ZOSYN) IVPB 3.375 g  Status:  Discontinued     3.375 g 100 mL/hr over 30 Minutes Intravenous Every 6 hours 11/11/19 0214 11/11/19 0321   11/11/19 0015  vancomycin (VANCOCIN) IVPB 1000 mg/200 mL premix     1,000 mg 200 mL/hr over 60 Minutes Intravenous  Once 11/11/19 0010 11/11/19 0252    11/10/19 2345  vancomycin (VANCOCIN) IVPB 1000 mg/200 mL premix     1,000 mg 200 mL/hr over 60 Minutes Intravenous  Once 11/10/19 2342 11/11/19 0127   11/10/19 2345  cefTRIAXone (ROCEPHIN) 2 g in sodium chloride 0.9 % 100 mL IVPB     2 g 200 mL/hr over 30 Minutes Intravenous  Once 11/10/19 2342 11/11/19 0131       Objective: Vitals:   11/17/19 0458 11/17/19 0500 11/17/19 0741 11/17/19 1142  BP:  118/73 117/75 117/69  Pulse:  (!) 114 (!) 107 93  Resp:  20 18 17   Temp:  98.6 F (37 C) 98.8 F (37.1 C) 98 F (36.7 C)  TempSrc:  Oral Oral   SpO2:  96% 93% 97%  Weight: 92.5 kg     Height:        Intake/Output Summary (Last 24 hours) at 11/17/2019 1252 Last data filed at 11/17/2019 0940 Gross per 24 hour  Intake 720 ml  Output --  Net 720 ml   Filed Weights   11/15/19 0421 11/16/19 0514 11/17/19 0458  Weight: 92.5 kg 93.4 kg 92.5 kg    Examination: General exam: Appears comfortable  HEENT: PERRLA, oral mucosa moist, no sclera icterus or thrush Respiratory system: Clear to auscultation. Respiratory effort normal. Cardiovascular system: S1 & S2 heard,  No murmurs  Gastrointestinal system: Abdomen soft, non-tender, nondistended. Normal bowel sounds   Central nervous system: Alert and oriented. No focal neurological deficits. Extremities: No cyanosis, clubbing - left leg dressing not opened Skin: No rashes or ulcers Psychiatry:  Mood & affect appropriate.     Data Reviewed: I have personally reviewed following labs and imaging studies  CBC: Recent Labs  Lab 11/10/19 2357 11/11/19 0352 11/13/19 0349 11/14/19 0356 11/15/19 0623 11/16/19 0932 11/17/19 0605  WBC 12.8*   < > 18.9* 13.6* 13.6* 10.9* 10.6*  NEUTROABS 10.1*  --   --   --   --   --   --   HGB 9.5*   < > 8.6* 9.2* 8.7* 9.2* 8.3*  HCT 30.0*   < > 28.6* 30.3* 28.8* 30.2* 27.5*  MCV 80.4   < > 85.1 83.5 84.5 83.2 84.6  PLT 91*   < > 116* 41* 31* 60* 72*   < > = values in this interval not displayed.    Basic Metabolic Panel: Recent Labs  Lab 11/13/19 0349 11/14/19 0356 11/15/19 0623 11/16/19 0932 11/17/19 0605  NA 131* 136 137 136 139  K 4.8 3.8 3.8 3.0* 3.1*  CL 98 102 101 98 101  CO2 23 26 26 29 28   GLUCOSE 194* 153* 166* 204* 142*  BUN 33* 24* 15 13 12   CREATININE 0.89 0.77 0.58* 0.53* 0.57*  CALCIUM 7.9* 8.0* 8.1* 8.4* 8.2*  MG  --   --   --   --  1.7   GFR: Estimated Creatinine Clearance: 103.4 mL/min (A) (by C-G formula based on SCr of 0.57 mg/dL (L)). Liver Function Tests: Recent Labs  Lab 11/10/19 2357 11/13/19 0349 11/14/19 0356 11/15/19 0623  AST 359* 201* 96* 63*  ALT 280* 195* 138* 94*  ALKPHOS 190* 186* 181* 160*  BILITOT 2.2* 1.8* 1.4* 2.2*  PROT 7.2 6.3* 6.3* 5.8*  ALBUMIN 2.5* 2.3* 2.3* 2.2*   Recent Labs  Lab 11/10/19 2357  LIPASE 16   No results for input(s): AMMONIA in the last 168 hours. Coagulation Profile: Recent Labs  Lab 11/10/19 2357 11/13/19 0723  INR 1.9* 2.8*   Cardiac Enzymes: No results for input(s): CKTOTAL, CKMB, CKMBINDEX, TROPONINI in the last 168 hours. BNP (last 3 results) No results for input(s): PROBNP in the last 8760 hours. HbA1C: No results for input(s): HGBA1C in the last 72 hours. CBG: Recent Labs  Lab 11/16/19 1139 11/16/19 1636 11/16/19 2132 11/17/19 0744 11/17/19 1141  GLUCAP 183* 158* 130* 128* 149*   Lipid Profile: No results for input(s): CHOL, HDL, LDLCALC, TRIG, CHOLHDL, LDLDIRECT in the last 72 hours. Thyroid Function Tests: No results for input(s): TSH, T4TOTAL, FREET4, T3FREE, THYROIDAB in the last 72 hours. Anemia Panel: No results for input(s): VITAMINB12, FOLATE, FERRITIN, TIBC, IRON, RETICCTPCT in the last 72 hours. Urine analysis:    Component Value Date/Time   COLORURINE AMBER (A) 11/10/2019 2357   APPEARANCEUR HAZY (A) 11/10/2019 2357   APPEARANCEUR Clear 07/03/2019 1513   LABSPEC 1.022 11/10/2019 2357   PHURINE 5.0 11/10/2019 2357   GLUCOSEU >=500 (A) 11/10/2019 2357    HGBUR SMALL (A) 11/10/2019 2357   BILIRUBINUR NEGATIVE 11/10/2019 2357   BILIRUBINUR Negative 07/03/2019 1513   KETONESUR 20 (A) 11/10/2019 2357   PROTEINUR 30 (A) 11/10/2019 2357   NITRITE NEGATIVE 11/10/2019 2357   LEUKOCYTESUR NEGATIVE 11/10/2019 2357   Sepsis Labs: @LABRCNTIP (procalcitonin:4,lacticidven:4) ) Recent Results (from the past 240 hour(s))  Blood Culture (routine x 2)     Status: None   Collection Time: 11/10/19 11:57 PM   Specimen: BLOOD  Result Value Ref Range Status   Specimen Description BLOOD BLOOD LEFT HAND  Final   Special Requests   Final    BOTTLES DRAWN AEROBIC AND ANAEROBIC Blood Culture results may not be optimal due to an excessive volume of blood received in culture bottles   Culture   Final    NO GROWTH 5 DAYS Performed at [facility], [address] [place], [place] [address]    Report Status 11/16/2019 FINAL  Final  Blood Culture (routine x 2)     Status: None   Collection Time: 11/10/19 11:57 PM   Specimen: BLOOD  Result Value Ref Range Status   Specimen Description BLOOD BLOOD RIGHT HAND  Final   Special Requests   Final    BOTTLES DRAWN AEROBIC AND ANAEROBIC Blood Culture results may not be optimal due to an inadequate volume of blood received in culture bottles   Culture   Final    NO GROWTH 5 DAYS Performed at [facility], [address] [place], [place] [address]    Report Status 11/16/2019 FINAL  Final  Urine culture     Status: Abnormal   Collection Time: 11/10/19 11:57 PM   Specimen: In/Out Cath Urine  Result Value Ref Range Status   Specimen Description   Final    IN/OUT CATH URINE Performed at [facility], [address] [place], [place] [address]    Special Requests   Final    NONE Performed at [facility], [address] [place], [place] [address]    Culture MULTIPLE SPECIES PRESENT, SUGGEST RECOLLECTION (A)  Final   Report Status 11/12/2019 FINAL  Final  Aerobic/Anaerobic  Culture (surgical/deep wound)     Status: Abnormal (Preliminary result)   Collection Time: 11/10/19 11:57 PM   Specimen: Leg; Wound  Result Value Ref Range Status   Specimen Description   Final    LEG RIGHT Performed at [facility], [address] [place], [place] [address]    Special Requests   Final    Normal Performed at [facility], [address] [place], [place] [address]    Gram Stain   Final    NO WBC SEEN MODERATE GRAM POSITIVE RODS Performed at [facility], [place] [address] [place], [place] [address]    Culture MULTIPLE ORGANISMS PRESENT, NONE PREDOMINANT (A)  Final   Report Status PENDING  Incomplete  Respiratory Panel by RT PCR (Flu A&B, Covid) - Nasopharyngeal Swab     Status: None   Collection Time: 11/11/19  1:46 AM   Specimen: Nasopharyngeal Swab  Result Value Ref Range Status   SARS Coronavirus 2 by RT PCR NEGATIVE NEGATIVE Final    Comment: (NOTE) SARS-CoV-2 target nucleic acids are NOT DETECTED. The SARS-CoV-2 RNA is generally detectable in upper respiratoy specimens during the acute phase of infection. The lowest concentration of SARS-CoV-2 viral copies this assay can detect is 131 copies/mL. A negative result does not preclude SARS-Cov-2 infection and should not be used as the sole basis for treatment or other patient management decisions. A negative result may occur with  improper specimen collection/handling, submission of specimen other than nasopharyngeal swab, presence of viral mutation(s) within the areas targeted by this assay, and inadequate number of viral copies (<131 copies/mL). A negative result must be combined with clinical observations, patient history, and epidemiological information. The expected result is Negative. Fact Sheet for Patients:  PinkCheek.be Fact Sheet for Healthcare Providers:  GravelBags.it This test is not yet ap proved or cleared by  the [place] FDA and  has been authorized for detection and/or diagnosis of SARS-CoV-2 by FDA under an Emergency Use Authorization (EUA). This EUA will remain  in effect (meaning this test can be used) for the duration of the COVID-19 declaration under Section 564(b)(1) of the Act, 21 U.S.C. section 360bbb-3(b)(1), unless the authorization is terminated or revoked sooner.    Influenza A by PCR NEGATIVE NEGATIVE Final   Influenza B by PCR NEGATIVE NEGATIVE Final    Comment: (NOTE) The Xpert Xpress SARS-CoV-2/FLU/RSV assay is intended as an aid in  the diagnosis of influenza from Nasopharyngeal swab specimens and  should not be used as a sole basis for treatment. Nasal washings and  aspirates are unacceptable for Xpert Xpress SARS-CoV-2/FLU/RSV  testing. Fact Sheet for Patients: PinkCheek.be Fact Sheet for Healthcare Providers: GravelBags.it This test is not yet approved or cleared by the [place] FDA and  has been authorized for detection and/or diagnosis of SARS-CoV-2 by  FDA under an Emergency Use Authorization (EUA). This EUA will remain  in effect (meaning this test can be used) for the duration of the  Covid-19 declaration under Section 564(b)(1) of the Act, 21  U.S.C. section 360bbb-3(b)(1), unless the authorization is  terminated or revoked. Performed at [facility], [address] [place], [place] [address]   Aerobic/Anaerobic Culture (surgical/deep wound)     Status: None (Preliminary result)   Collection Time: 11/11/19  4:44 PM   Specimen: PATH Amputaion Arm/Leg; Tissue  Result Value Ref Range Status   Specimen Description   Final    WOUND Performed at [facility], [address] [place], [place] [address]    Special Requests   Final    NONE Performed at [facility], [address] [place], [place] [address]    Gram Stain   Final    FEW WBC PRESENT, PREDOMINANTLY  PMN FEW GRAM POSITIVE COCCI IN CLUSTERS RARE GRAM NEGATIVE RODS Performed at [facility], [place] [address] [place], [place] [address]    Culture   Final    FEW STAPHYLOCOCCUS SPECIES (COAGULASE NEGATIVE) MODERATE ENTEROCOCCUS FAECALIS NO ANAEROBES ISOLATED; CULTURE IN PROGRESS FOR 5 DAYS    Report Status PENDING  Incomplete   Organism ID, Bacteria STAPHYLOCOCCUS SPECIES (COAGULASE NEGATIVE)  Final   Organism ID, Bacteria ENTEROCOCCUS FAECALIS  Final      Susceptibility   Enterococcus faecalis - MIC*    AMPICILLIN <=2 SENSITIVE Sensitive     VANCOMYCIN 2 SENSITIVE Sensitive     GENTAMICIN SYNERGY SENSITIVE Sensitive     * MODERATE ENTEROCOCCUS FAECALIS   Staphylococcus species (coagulase negative) - MIC*    CIPROFLOXACIN >=8 RESISTANT Resistant     ERYTHROMYCIN >=8 RESISTANT Resistant     GENTAMICIN <=0.5 SENSITIVE Sensitive     OXACILLIN >=4 RESISTANT Resistant     TETRACYCLINE <=1 SENSITIVE Sensitive     VANCOMYCIN <=0.5 SENSITIVE Sensitive     TRIMETH/SULFA <=10 SENSITIVE Sensitive     CLINDAMYCIN <=0.25 SENSITIVE Sensitive     RIFAMPIN <=0.5 SENSITIVE Sensitive     Inducible Clindamycin NEGATIVE Sensitive     * FEW STAPHYLOCOCCUS SPECIES (COAGULASE NEGATIVE)         Radiology Studies: CT HEAD WO CONTRAST  Result Date: 11/15/2019 CLINICAL DATA:  Altered mental status EXAM: CT HEAD WITHOUT CONTRAST TECHNIQUE: Contiguous axial images were obtained from the base of the skull through the vertex without intravenous contrast. COMPARISON:  2018 FINDINGS: Brain: There is no acute intracranial hemorrhage, mass effect, or edema. No hydrocephalus or extra-axial fluid collection. No new loss of gray-white differentiation. There are chronic left frontal, parietal, occipital, and cerebellar infarcts. This has progressed since 2018. Additional patchy hypoattenuation in the supratentorial white matter is nonspecific but probably reflects mild to moderate chronic microvascular ischemic  changes. There is ex vacuo dilatation of the left lateral ventricle. Vascular: There is atherosclerotic calcification at the skull base. Skull: Calvarium is unremarkable. Sinuses/Orbits: No acute finding. Other: None. IMPRESSION: No acute intracranial abnormality. Multiple chronic left-sided infarcts have progressed since 2018. Chronic microvascular ischemic changes. Electronically Signed   By: Macy Mis M.D.   On: 11/15/2019 14:24      Scheduled Meds: . vitamin C  500 mg Oral Daily  . cholecalciferol  5,000 Units Oral Daily  . docusate sodium  100 mg Oral Daily  . ferrous sulfate  325 mg Oral BID WC  . gabapentin  1,200 mg Oral TID  . insulin aspart  0-15 Units Subcutaneous TID PC & HS  . metoprolol tartrate  50 mg Oral BID  . multivitamin with minerals  1 tablet Oral Daily  . nystatin   Topical BID  .  pantoprazole  20 mg Oral Daily  . potassium chloride  40 mEq Oral Q4H   Continuous Infusions: . sodium chloride 1,000 mL (11/17/19 0230)  . ampicillin-sulbactam (UNASYN) IV 1.5 g (11/17/19 1208)  . vancomycin 1,250 mg (11/17/19 0543)     LOS: 6 days      Debbe Odea, MD [facility] Pager: [email or web address] 11/17/2019, 12:52 PM

## 2019-11-17 NOTE — Care Management Important Message (Signed)
Important Message  Patient Details  Name: [name]. MRN: [id number] Date of Birth: [birth year]   Medicare Important Message Given:  Yes     Dannette Barbara 11/17/2019, 1:06 PM

## 2019-11-17 NOTE — Progress Notes (Signed)
ID [name].is a 64 y.o.malewith a history of CVA, blind rt eye, PAD, rt foot gangrene,Rt leg BKA recently underwent Rt above knee amputation on 10/26/19 for a non healing BKA stump which was present for a year. He was admitted on 4/9 with declining health and altered mental status. Also had purulent drainage from the stump and fever of 101.4  on admission- underwent AKA debridement   Pt seen with wound care nurse Rt AKA stump assessed  11/17/19- wound looks predominantly healthy- with some areas of darkend tissue in the edges       Pre-debridement   Impression/recommendation Infected AKA stump with ischemia s/p debridement Culture Staph epidermidis and Group G strep Antibiotic will be changed to linezolid- vanco will be DC Currently on unasyn- which will be continued  Chronic active hepatitis B infection- cirrhosis May benefit from G/hepatology follow up  Thrombocytopenia due to the above Observe platelets closely while on linezolid  Leucocytosis due to infected Rt AKA - resolved  Afib on eliquis and metoprolol CVA - rt eye vision loss  Anemia  Discussed the management with wife ID will follow him peripherally this weekend Call if needed

## 2019-11-17 NOTE — NC FL2 (Signed)
[place] LEVEL OF CARE SCREENING TOOL     IDENTIFICATION  Patient Name: [name]. Birthdate: 1955/09/25 Sex: male Admission Date (Current Location): 11/10/2019  [place] and [place] Number:  Engineering geologist and Address:  [facility], [address] [place], [place] [address]      Provider Number: [id number]  Attending Physician Name and Address:  Debbe Odea, MD  Relative Name and Phone Number:  Collene Schlichter  [phone]    Current Level of Care: Hospital Recommended Level of Care: [place] Prior Approval Number:    Date Approved/Denied:   PASRR Number: [id number]  Discharge Plan: SNF    Current Diagnoses: Patient Active Problem List   Diagnosis Date Noted  . Sepsis ([place]) 11/11/2019  . Pressure ulcer of BKA stump, stage 3 ([place]) 10/26/2019  . Gangrene ([place]) 09/06/2019  . Pressure ulcer of right calf, unstageable ([place]) 07/17/2019  . Fungal dermatitis 04/13/2019  . Leg edema, right 03/23/2019  . Acute right hemiparesis ([place]) 09/26/2018  . Lung nodule 08/15/2018  . 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 Neurogenic pain 08/10/2018  . Stump pain ([place]) (Right) 07/20/2018  . Amputation stump infection ([place]) 07/20/2018  . Chronic anticoagulation (ELIQUIS) 07/20/2018  . Vitamin D insufficiency 07/18/2018  . Elevated sed rate 07/18/2018  . Chronic pain of right lower extremity 07/11/2018  . Chronic pain syndrome 07/11/2018  . Pharmacologic therapy 07/11/2018  . Hx of BKA, right ([place]) 06/16/2018  . S/P bilateral BKA (below knee amputation) ([place]) 06/11/2018  . Iron deficiency anemia 05/11/2018  . A-fib ([place]) 08/20/2017  . PVD (peripheral vascular disease) ([place]) 07/15/2017  . 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 Memory loss 09/27/2015  . Alcoholic peripheral neuropathy ([place]) 05/16/2015  . Diabetic peripheral neuropathy ([place]) 05/16/2015  . History of stroke  with residual deficit 04/01/2015  . Chronic headaches 03/15/2015  . Right homonymous hemianopsia 02/17/2015  . History of tobacco abuse 01/24/2015  . Hyperlipidemia 01/21/2015  . Benign hypertensive renal disease 01/21/2015    Orientation RESPIRATION BLADDER Height & Weight     Self, Time, Situation, Place  Normal Incontinent Weight: 92.5 kg Height:  5\' 9"  (175.3 cm)  BEHAVIORAL SYMPTOMS/MOOD NEUROLOGICAL BOWEL NUTRITION STATUS    Convulsions/Seizures Incontinent Diet(regular)  AMBULATORY STATUS COMMUNICATION OF NEEDS Skin   Extensive Assist Verbally Wound Vac                       Personal Care Assistance Level of Assistance  Bathing, Dressing Bathing Assistance: Maximum assistance   Dressing Assistance: Maximum assistance     Functional Limitations Info             SPECIAL CARE FACTORS FREQUENCY  PT (By licensed PT), OT (By licensed OT)     PT Frequency: 5x a week OT Frequency: 5x a week            Contractures Contractures Info: Not present    Additional Factors Info  Code Status, Allergies, Insulin Sliding Scale Code Status Info: Full Allergies Info: Sodium Pentabarbitol, lipitor   Insulin Sliding Scale Info: Sliding scale       Current Medications (11/17/2019):  This is the current hospital active medication list Current Facility-Administered Medications  Medication Dose Route Frequency Provider Last Rate Last Admin  . 0.9 %  sodium chloride infusion   Intravenous PRN Wyvonnia Dusky, MD 10 mL/hr at 11/17/19 0230 1,000 mL at 11/17/19 0230  .  acetaminophen (TYLENOL) tablet 650 mg  650 mg Oral Q6H PRN Mansy, Jan A, MD       Or  . acetaminophen (TYLENOL) suppository 650 mg  650 mg Rectal Q6H PRN Mansy, Jan A, MD      . alum & mag hydroxide-simeth (MAALOX/MYLANTA) 200-200-20 MG/5ML suspension 30 mL  30 mL Oral Q4H PRN Mansy, Jan A, MD      . ampicillin-sulbactam (UNASYN) 1.5 g in sodium chloride 0.9 % 100 mL IVPB  1.5 g Intravenous Q6H  Ravishankar, Joellyn Quails, MD 200 mL/hr at 11/17/19 1208 1.5 g at 11/17/19 1208  . ascorbic acid (VITAMIN C) tablet 500 mg  500 mg Oral Daily Mansy, Jan A, MD   500 mg at 11/17/19 0835  . cholecalciferol (VITAMIN D3) tablet 5,000 Units  5,000 Units Oral Daily Mansy, Arvella Merles, MD   5,000 Units at 11/17/19 0835  . docusate sodium (COLACE) capsule 100 mg  100 mg Oral Daily Mansy, Jan A, MD   100 mg at 11/16/19 0802  . ferrous sulfate tablet 325 mg  325 mg Oral BID WC Mansy, Jan A, MD   325 mg at 11/17/19 0835  . gabapentin (NEURONTIN) tablet 1,200 mg  1,200 mg Oral TID Mansy, Jan A, MD   1,200 mg at 11/17/19 0835  . HYDROmorphone (DILAUDID) injection 1 mg  1 mg Intravenous Q4H PRN Debbe Odea, MD   1 mg at 11/17/19 1202  . insulin aspart (novoLOG) injection 0-15 Units  0-15 Units Subcutaneous TID PC & HS Mansy, Arvella Merles, MD   2 Units at 11/17/19 1202  . lactulose (CHRONULAC) 10 GM/15ML solution 30 g  30 g Oral BID PRN Wyvonnia Dusky, MD      . magnesium hydroxide (MILK OF MAGNESIA) suspension 30 mL  30 mL Oral Daily PRN Mansy, Jan A, MD      . metoprolol tartrate (LOPRESSOR) injection 5 mg  5 mg Intravenous Q6H PRN Wyvonnia Dusky, MD   5 mg at 11/14/19 1822  . metoprolol tartrate (LOPRESSOR) tablet 50 mg  50 mg Oral BID Debbe Odea, MD   50 mg at 11/17/19 0835  . multivitamin with minerals tablet 1 tablet  1 tablet Oral Daily Mansy, Jan A, MD   1 tablet at 11/17/19 0834  . nystatin (MYCOSTATIN/NYSTOP) topical powder   Topical BID Evaristo Bury, MD   Given at 11/17/19 [phone]  . ondansetron (ZOFRAN) tablet 4 mg  4 mg Oral Q6H PRN Mansy, Jan A, MD       Or  . ondansetron [facility]) injection 4 mg  4 mg Intravenous Q6H PRN Mansy, Jan A, MD   4 mg at 11/11/19 1615  . oxyCODONE (Oxy IR/ROXICODONE) immediate release tablet 10 mg  10 mg Oral Q8H PRN Wyvonnia Dusky, MD   10 mg at 11/17/19 0223  . pantoprazole (PROTONIX) EC tablet 20 mg  20 mg Oral Daily Mansy, Jan A, MD   20 mg at 11/17/19 [id number]  .  potassium chloride SA (KLOR-CON) CR tablet 40 mEq  40 mEq Oral Q4H Debbe Odea, MD   40 mEq at 11/17/19 0845  . traZODone (DESYREL) tablet 25 mg  25 mg Oral QHS PRN Mansy, Jan A, MD   25 mg at 11/14/19 2227  . vancomycin (VANCOREADY) IVPB 1250 mg/250 mL  1,250 mg Intravenous Q12H Mansy, Jan A, MD 166.7 mL/hr at 11/17/19 0543 1,250 mg at 11/17/19 0543     Discharge Medications: Please see discharge summary for a list  of discharge medications.  Relevant Imaging Results:  Relevant Lab Results:   Additional Information [id number]  Victorino Dike, RN

## 2019-11-18 DIAGNOSIS — T879 Unspecified complications of amputation stump: Secondary | ICD-10-CM

## 2019-11-18 LAB — BASIC METABOLIC PANEL
Anion gap: 7 (ref 5–15)
BUN: 13 mg/dL (ref 8–23)
CO2: 27 mmol/L (ref 22–32)
Calcium: 8.2 mg/dL — ABNORMAL LOW (ref 8.9–10.3)
Chloride: 100 mmol/L (ref 98–111)
Creatinine, Ser: 0.54 mg/dL — ABNORMAL LOW (ref 0.61–1.24)
GFR calc Af Amer: >60 mL/min (ref >60)
GFR calc non Af Amer: >60 mL/min (ref >60)
Glucose, Bld: 169 mg/dL — ABNORMAL HIGH (ref 70–99)
Potassium: 3.8 mmol/L (ref 3.5–5.1)
Sodium: 134 mmol/L — ABNORMAL LOW (ref 135–145)

## 2019-11-18 LAB — AEROBIC/ANAEROBIC CULTURE W GRAM STAIN (SURGICAL/DEEP WOUND)

## 2019-11-18 LAB — GLUCOSE, CAPILLARY
Glucose-Capillary: 125 mg/dL — ABNORMAL HIGH (ref 70–99)
Glucose-Capillary: 156 mg/dL — ABNORMAL HIGH (ref 70–99)
Glucose-Capillary: 155 mg/dL — ABNORMAL HIGH (ref 70–99)
Glucose-Capillary: 142 mg/dL — ABNORMAL HIGH (ref 70–99)
Glucose-Capillary: 154 mg/dL — ABNORMAL HIGH (ref 70–99)

## 2019-11-18 MED ORDER — ENSURE MAX PROTEIN PO LIQD
11.0000 [oz_av] | Freq: Two times a day (BID) | ORAL | Status: DC
  Administered 2019-11-19 – 2019-11-29 (×10): 11 [oz_av] via ORAL
  Filled 2019-11-18: qty 330

## 2019-11-18 MED ORDER — GLUCERNA SHAKE PO LIQD
237.0000 mL | Freq: Three times a day (TID) | ORAL | Status: DC
  Administered 2019-11-18 (×2): 237 mL via ORAL

## 2019-11-18 MED ORDER — JUVEN PO PACK
1.0000 | PACK | Freq: Two times a day (BID) | ORAL | Status: DC
  Administered 2019-11-22 – 2019-11-29 (×11): 1 via ORAL

## 2019-11-18 MED ORDER — OXYCODONE HCL 5 MG PO TABS
10.0000 mg | ORAL_TABLET | Freq: Four times a day (QID) | ORAL | Status: DC | PRN
  Administered 2019-11-18 – 2019-11-27 (×18): 10 mg via ORAL
  Filled 2019-11-18 (×18): qty 2

## 2019-11-18 NOTE — Progress Notes (Addendum)
PROGRESS NOTE    Glenn B Shrode Jr.   VX:[id number]  DOB: [birth year]  DOA: 11/10/2019 PCP: [name], DO   Brief Narrative:  [name]. is a 64 y.o. 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 He was started on IV antibiotics.  Also noted was that he was in A-fib with RVR for which he was started on a Cardizem infusion.   Subjective: He has some pain in his leg today. Otherwise no complaints.     Assessment & Plan:   Active Problems:   Sepsis with right stump infection after AKA - appreciate ID and vascular surgery assistance- - wound culture result on 4/17 > MRSA-  cont Abx per ID->  Unasyn and Zyvoxx - s/p I and D - cont wound vac with changes on MWF -  OK to resume Eliquis per Dr Lucky Cowboy-  resumed on 4/16 - nutrition consult to add protein for wound healing  Hypokalemia - replaced  Lethargy, confusion (4/14/ 4/15) - per wife, he is much better today- will d/c Xanax which he does not take at home and he received on the evening of the 13th - CT head yesterday ordered by vascular-neg for new CVA  PAD - s/p angioplasty and multiple stents  Cirrhosis - per wife he has a h/o ETOH abuse but he has stopped drinking completely - only trace ascites on MRI  Hep B surface Ag + and E Ab + - no symptoms of hepatitis- ID recommended obtained Hep B DNR which I ordered on 4/15- it is in process - I spoke with Dr Vicente Males who recommended outpt follow up (the patient's wife states he is his hepatologist)  Multiple liver lesions - cont outpatient surveillance of these with serial MRI  - AFP < 0.9  Permanent A-Fib - on Metoprolol - HR rapid - increased to 50 BID- HR better controlled - Eliquis was on hold as plt quite low (31) - resumed on  4/16  Thrombocytopenia - possibly related to cirrhosis and maybe also sepsis - platelets 31 > 60 > 72-  - follow platelets on Zyvoxx  DM2 - on Jardiance as outpt- a1C IS 7.7 - cont SSI- sugars very well controlled  Time spent in minutes: 35 DVT prophylaxis: holding Code Status: Full code Family Communication: wife  Disposition Plan:  From home- not ready to d/c due to acute infection- awaiting final recommendations from ID and vascular- ID states he will not need IV Abx on d/c - will need SNF - TOC consulted on 4/15 Consultants:   ID  Vascular surgery Procedures:   I and D Antimicrobials:  Anti-infectives (From admission, onward)   Start     Dose/Rate Route Frequency Ordered Stop   11/17/19 1800  linezolid (ZYVOX) tablet 600 mg     600 mg Oral Every 12 hours 11/17/19 1406     11/16/19 1800  ampicillin-sulbactam (UNASYN) 1.5 g in sodium chloride 0.9 % 100 mL IVPB     1.5 g 200 mL/hr over 30 Minutes Intravenous Every 6 hours 11/16/19 1418     11/13/19 2200  cefTRIAXone (ROCEPHIN) 2 g in sodium chloride 0.9 % 100 mL IVPB  Status:  Discontinued     2 g 200 mL/hr over 30 Minutes Intravenous Every 24  hours 11/13/19 2007 11/16/19 1418   11/13/19 2200  metroNIDAZOLE (FLAGYL) IVPB 500 mg  Status:  Discontinued     500 mg 100 mL/hr over 60 Minutes Intravenous Every 8 hours 11/13/19 2007 11/16/19 1418   11/11/19 1400  vancomycin (VANCOREADY) IVPB 1250 mg/250 mL  Status:  Discontinued     1,250 mg 166.7 mL/hr over 90 Minutes Intravenous Every 12 hours 11/11/19 0158 11/17/19 1406   11/11/19 0600  piperacillin-tazobactam (ZOSYN) IVPB 3.375 g  Status:  Discontinued     3.375 g 12.5 mL/hr over 240 Minutes Intravenous Every 8 hours 11/11/19 0322 11/13/19 2007   11/11/19 0215  vancomycin (VANCOCIN) IVPB 1000 mg/200 mL premix  Status:  Discontinued     1,000 mg 200 mL/hr over 60 Minutes Intravenous  Once 11/11/19 0214 11/11/19 0321   11/11/19 0215  piperacillin-tazobactam (ZOSYN) IVPB  3.375 g  Status:  Discontinued     3.375 g 100 mL/hr over 30 Minutes Intravenous Every 6 hours 11/11/19 0214 11/11/19 0321   11/11/19 0015  vancomycin (VANCOCIN) IVPB 1000 mg/200 mL premix     1,000 mg 200 mL/hr over 60 Minutes Intravenous  Once 11/11/19 0010 11/11/19 0252   11/10/19 2345  vancomycin (VANCOCIN) IVPB 1000 mg/200 mL premix     1,000 mg 200 mL/hr over 60 Minutes Intravenous  Once 11/10/19 2342 11/11/19 0127   11/10/19 2345  cefTRIAXone (ROCEPHIN) 2 g in sodium chloride 0.9 % 100 mL IVPB     2 g 200 mL/hr over 30 Minutes Intravenous  Once 11/10/19 2342 11/11/19 0131       Objective: Vitals:   11/18/19 0458 11/18/19 0537 11/18/19 0757 11/18/19 1142  BP:  121/72 117/74 105/82  Pulse:  (!) 110 97 88  Resp:  20 19 19   Temp:  98 F (36.7 C) 98.9 F (37.2 C) 98.2 F (36.8 C)  TempSrc:   Oral   SpO2:  97% 97% 98%  Weight: 92.8 kg     Height:        Intake/Output Summary (Last 24 hours) at 11/18/2019 1241 Last data filed at 11/18/2019 0458 Gross per 24 hour  Intake 2821.25 ml  Output 0 ml  Net 2821.25 ml   Filed Weights   11/16/19 0514 11/17/19 0458 11/18/19 0458  Weight: 93.4 kg 92.5 kg 92.8 kg    Examination: General exam: Appears comfortable  HEENT: PERRLA, oral mucosa moist, no sclera icterus or thrush Respiratory system: Clear to auscultation. Respiratory effort normal. Cardiovascular system: S1 & S2 heard,  No murmurs  Gastrointestinal system: Abdomen soft, non-tender, nondistended. Normal bowel sounds   Central nervous system: Alert and oriented. No focal neurological deficits. Extremities: No cyanosis, clubbing or edema- right leg dressing not opened Skin: No rashes or ulcers Psychiatry:  Mood & affect appropriate.      Data Reviewed: I have personally reviewed following labs and imaging studies  CBC: Recent Labs  Lab 11/13/19 0349 11/14/19 0356 11/15/19 0623 11/16/19 0932 11/17/19 0605  WBC 18.9* 13.6* 13.6* 10.9* 10.6*  HGB 8.6* 9.2*  8.7* 9.2* 8.3*  HCT 28.6* 30.3* 28.8* 30.2* 27.5*  MCV 85.1 83.5 84.5 83.2 84.6  PLT 116* 41* 31* 60* 72*   Basic Metabolic Panel: Recent Labs  Lab 11/14/19 0356 11/15/19 0623 11/16/19 0932 11/17/19 0605 11/18/19 0941  NA 136 137 136 139 134*  K 3.8 3.8 3.0* 3.1* 3.8  CL 102 101 98 101 100  CO2 26 26 29 28 27   GLUCOSE 153* 166* 204* 142* 169*  BUN 24* 15 13 12 13   CREATININE 0.77 0.58* 0.53* 0.57* 0.54*  CALCIUM 8.0* 8.1* 8.4* 8.2* 8.2*  MG  --   --   --  1.7  --    GFR: Estimated Creatinine Clearance: 103.5 mL/min (A) (by C-G formula based on SCr of 0.54 mg/dL (L)). Liver Function Tests: Recent Labs  Lab 11/13/19 0349 11/14/19 0356 11/15/19 0623  AST 201* 96* 63*  ALT 195* 138* 94*  ALKPHOS 186* 181* 160*  BILITOT 1.8* 1.4* 2.2*  PROT 6.3* 6.3* 5.8*  ALBUMIN 2.3* 2.3* 2.2*   No results for input(s): LIPASE, AMYLASE in the last 168 hours. No results for input(s): AMMONIA in the last 168 hours. Coagulation Profile: Recent Labs  Lab 11/13/19 0723  INR 2.8*   Cardiac Enzymes: No results for input(s): CKTOTAL, CKMB, CKMBINDEX, TROPONINI in the last 168 hours. BNP (last 3 results) No results for input(s): PROBNP in the last 8760 hours. HbA1C: No results for input(s): HGBA1C in the last 72 hours. CBG: Recent Labs  Lab 11/17/19 1141 11/17/19 1714 11/17/19 2221 11/18/19 0754 11/18/19 1141  GLUCAP 149* 152* 127* 125* 156*   Lipid Profile: No results for input(s): CHOL, HDL, LDLCALC, TRIG, CHOLHDL, LDLDIRECT in the last 72 hours. Thyroid Function Tests: No results for input(s): TSH, T4TOTAL, FREET4, T3FREE, THYROIDAB in the last 72 hours. Anemia Panel: No results for input(s): VITAMINB12, FOLATE, FERRITIN, TIBC, IRON, RETICCTPCT in the last 72 hours. Urine analysis:    Component Value Date/Time   COLORURINE AMBER (A) 11/10/2019 2357   APPEARANCEUR HAZY (A) 11/10/2019 2357   APPEARANCEUR Clear 07/03/2019 1513   LABSPEC 1.022 11/10/2019 2357   PHURINE  5.0 11/10/2019 2357   GLUCOSEU >=500 (A) 11/10/2019 2357   HGBUR SMALL (A) 11/10/2019 2357   BILIRUBINUR NEGATIVE 11/10/2019 2357   BILIRUBINUR Negative 07/03/2019 1513   KETONESUR 20 (A) 11/10/2019 2357   PROTEINUR 30 (A) 11/10/2019 2357   NITRITE NEGATIVE 11/10/2019 2357   LEUKOCYTESUR NEGATIVE 11/10/2019 2357   Sepsis Labs: @LABRCNTIP (procalcitonin:4,lacticidven:4) ) Recent Results (from the past 240 hour(s))  Blood Culture (routine x 2)     Status: None   Collection Time: 11/10/19 11:57 PM   Specimen: BLOOD  Result Value Ref Range Status   Specimen Description BLOOD BLOOD LEFT HAND  Final   Special Requests   Final    BOTTLES DRAWN AEROBIC AND ANAEROBIC Blood Culture results may not be optimal due to an excessive volume of blood received in culture bottles   Culture   Final    NO GROWTH 5 DAYS Performed at [facility], [address] [place], [place] [address]    Report Status 11/16/2019 FINAL  Final  Blood Culture (routine x 2)     Status: None   Collection Time: 11/10/19 11:57 PM   Specimen: BLOOD  Result Value Ref Range Status   Specimen Description BLOOD BLOOD RIGHT HAND  Final   Special Requests   Final    BOTTLES DRAWN AEROBIC AND ANAEROBIC Blood Culture results may not be optimal due to an inadequate volume of blood received in culture bottles   Culture   Final    NO GROWTH 5 DAYS Performed at [facility], [address] [place], [place] [address]    Report Status 11/16/2019 FINAL  Final  Urine culture     Status: Abnormal   Collection Time: 11/10/19 11:57 PM   Specimen: In/Out Cath Urine  Result Value Ref Range Status   Specimen Description  Final    IN/OUT CATH URINE Performed at [facility], [address], [place], [place] [address]    Special Requests   Final    NONE Performed at [facility], [address] [place], [place] [address]    Culture MULTIPLE SPECIES PRESENT, SUGGEST RECOLLECTION (A)  Final    Report Status 11/12/2019 FINAL  Final  Aerobic/Anaerobic Culture (surgical/deep wound)     Status: Abnormal   Collection Time: 11/10/19 11:57 PM   Specimen: Leg; Wound  Result Value Ref Range Status   Specimen Description   Final    LEG RIGHT Performed at [facility], [address], [place], [place] [address]    Special Requests   Final    Normal Performed at [facility], [address] [place], [place] [address]    Gram Stain NO WBC SEEN MODERATE GRAM POSITIVE RODS   Final   Culture (A)  Final    MULTIPLE ORGANISMS PRESENT, NONE PREDOMINANT NO ANAEROBES ISOLATED Performed at [facility], [place] [address], [place], [place] [address]    Report Status 11/17/2019 FINAL  Final  Respiratory Panel by RT PCR (Flu A&B, Covid) - Nasopharyngeal Swab     Status: None   Collection Time: 11/11/19  1:46 AM   Specimen: Nasopharyngeal Swab  Result Value Ref Range Status   SARS Coronavirus 2 by RT PCR NEGATIVE NEGATIVE Final    Comment: (NOTE) SARS-CoV-2 target nucleic acids are NOT DETECTED. The SARS-CoV-2 RNA is generally detectable in upper respiratoy specimens during the acute phase of infection. The lowest concentration of SARS-CoV-2 viral copies this assay can detect is 131 copies/mL. A negative result does not preclude SARS-Cov-2 infection and should not be used as the sole basis for treatment or other patient management decisions. A negative result may occur with  improper specimen collection/handling, submission of specimen other than nasopharyngeal swab, presence of viral mutation(s) within the areas targeted by this assay, and inadequate number of viral copies (<131 copies/mL). A negative result must be combined with clinical observations, patient history, and epidemiological information. The expected result is Negative. Fact Sheet for Patients:  PinkCheek.be Fact Sheet for Healthcare Providers:   GravelBags.it This test is not yet ap proved or cleared by the [place] FDA and  has been authorized for detection and/or diagnosis of SARS-CoV-2 by FDA under an Emergency Use Authorization (EUA). This EUA will remain  in effect (meaning this test can be used) for the duration of the COVID-19 declaration under Section 564(b)(1) of the Act, 21 U.S.C. section 360bbb-3(b)(1), unless the authorization is terminated or revoked sooner.    Influenza A by PCR NEGATIVE NEGATIVE Final   Influenza B by PCR NEGATIVE NEGATIVE Final    Comment: (NOTE) The Xpert Xpress SARS-CoV-2/FLU/RSV assay is intended as an aid in  the diagnosis of influenza from Nasopharyngeal swab specimens and  should not be used as a sole basis for treatment. Nasal washings and  aspirates are unacceptable for Xpert Xpress SARS-CoV-2/FLU/RSV  testing. Fact Sheet for Patients: PinkCheek.be Fact Sheet for Healthcare Providers: GravelBags.it This test is not yet approved or cleared by the [place] FDA and  has been authorized for detection and/or diagnosis of SARS-CoV-2 by  FDA under an Emergency Use Authorization (EUA). This EUA will remain  in effect (meaning this test can be used) for the duration of the  Covid-19 declaration under Section 564(b)(1) of the Act, 21  U.S.C. section 360bbb-3(b)(1), unless the authorization is  terminated  or revoked. Performed at [facility], [address] [place], [place] [address]   Aerobic/Anaerobic Culture (surgical/deep wound)     Status: None   Collection Time: 11/11/19  4:44 PM   Specimen: PATH Amputaion Arm/Leg; Tissue  Result Value Ref Range Status   Specimen Description   Final    WOUND Performed at [facility], [address], [place], [place] [address]    Special Requests   Final    NONE Performed at [facility], [address] [place],  [place] [address]    Gram Stain   Final    FEW WBC PRESENT, PREDOMINANTLY PMN FEW GRAM POSITIVE COCCI IN CLUSTERS RARE GRAM NEGATIVE RODS Performed at [facility], [place] [address], [place], [place] [address]    Culture   Final    FEW METHICILLIN RESISTANT STAPHYLOCOCCUS AUREUS MODERATE ENTEROCOCCUS FAECALIS NO ANAEROBES ISOLATED CORRECTED RESULTS PREVIOUSLY REPORTED AS: STAPHYLOCOCCUS SPECIES (COAGULASE NEGATIVE) CORRECTED RESULTS CALLED TO: A. RICHEY RN, AT [id number] 11/17/19 BY D. VANHOOK MULTIPLE ORGANISMS PRESENT, NONE PREDOMINANT    Report Status 11/18/2019 FINAL  Final   Organism ID, Bacteria METHICILLIN RESISTANT STAPHYLOCOCCUS AUREUS  Final   Organism ID, Bacteria ENTEROCOCCUS FAECALIS  Final      Susceptibility   Enterococcus faecalis - MIC*    AMPICILLIN <=2 SENSITIVE Sensitive     VANCOMYCIN 2 SENSITIVE Sensitive     GENTAMICIN SYNERGY SENSITIVE Sensitive     * MODERATE ENTEROCOCCUS FAECALIS   Methicillin resistant staphylococcus aureus - MIC*    CIPROFLOXACIN >=8 RESISTANT Resistant     ERYTHROMYCIN >=8 RESISTANT Resistant     GENTAMICIN <=0.5 SENSITIVE Sensitive     OXACILLIN >=4 RESISTANT Resistant     TETRACYCLINE <=1 SENSITIVE Sensitive     VANCOMYCIN <=0.5 SENSITIVE Sensitive     TRIMETH/SULFA <=10 SENSITIVE Sensitive     CLINDAMYCIN <=0.25 SENSITIVE Sensitive     RIFAMPIN <=0.5 SENSITIVE Sensitive     Inducible Clindamycin NEGATIVE Sensitive     * FEW METHICILLIN RESISTANT STAPHYLOCOCCUS AUREUS         Radiology Studies: No results found.    Scheduled Meds: . apixaban  5 mg Oral BID  . vitamin C  500 mg Oral Daily  . cholecalciferol  5,000 Units Oral Daily  . docusate sodium  100 mg Oral Daily  . feeding supplement (GLUCERNA SHAKE)  237 mL Oral TID BM  . ferrous sulfate  325 mg Oral BID WC  . gabapentin  1,200 mg Oral TID  . insulin aspart  0-15 Units Subcutaneous TID PC & HS  . linezolid  600 mg Oral Q12H  . metoprolol tartrate  50 mg Oral BID  .  multivitamin with minerals  1 tablet Oral Daily  . pantoprazole  20 mg Oral Daily   Continuous Infusions: . sodium chloride 1,000 mL (11/17/19 0230)  . ampicillin-sulbactam (UNASYN) IV 1.5 g (11/18/19 1152)     LOS: 7 days      Debbe Odea, MD [facility] Pager: [email or web address] 11/18/2019, 12:41 PM

## 2019-11-18 NOTE — Progress Notes (Signed)
Initial Nutrition Assessment  RD working remotely.  DOCUMENTATION CODES:   Not applicable  INTERVENTION:  Provide Ensure Max Protein po BID between meals, each supplement provides 150 kcal and 30 grams of protein.  Provide Juven 1 pack po BID with meals, each supplement provides 95 kcal, 2.5 grams collagen protein, 7 grams L-Arginine, 7 grams L-Glutamine, 300 mg vitamin C, 9.5 mg zinc, and other micronutrients essential for wound healing.  Continue daily MVI and vitamin C 500 mg daily.  Encouraged adequate intake of protein at meals. Discussed which foods contain protein and which items are available on menu.  NUTRITION DIAGNOSIS:   Increased nutrient needs related to wound healing as evidenced by estimated needs.  GOAL:   Patient will meet greater than or equal to 90% of their needs  MONITOR:   PO intake, Supplement acceptance, Labs, Weight trends, Skin, I & O's  REASON FOR ASSESSMENT:   Consult Assessment of nutrition requirement/status, Diet education  ASSESSMENT:   64 year old male with PMHx of HTN, HLD, PVD, paroxysmal A-fib, GERD, memory loss, hx CVAs, remote EtOH abuse, DM type 2, cirrhosis, blindness in right eye, who underwent right AKA on 3/25 for non-healing BKA who is now admitted with sepsis, right stump infection, lethargy/confusion.   Spoke with patient over the phone. He was not able to provide a very thorough history. He reported his appetite was "wonderful" and unchanged from baseline. He is unable to report how much of his meals he is eating or which types of food he is eating. According to chart PO intake is variable 0-60%. Patient is amenable to drinking ONS between meals to help meet calorie/protein needs. Will d/c Glucerna and replace with Ensure Max Protein as it has a much higher protein content.  Patient denies any weight loss and reports he is weight-stable. According to chart patient's weight is trending up. He was 85.3 kg on 06/02/2019. He is now  92.8 kg (204.6 lbs).  Medications reviewed and include: vitamin C 500 mg daily, vitamin D3 5000 units daily, Colace 100 mg daily, ferrous sulfate 325 mg BID, Novolog 0-15 units TID, MVI daily, pantoprazole, Unasyn.  Labs reviewed: CBG 125-156, Sodium 134, Creatinine 0.54.  NUTRITION - FOCUSED PHYSICAL EXAM:  Unable to complete at this time as RD is working remotely.  Diet Order:   Diet Order            Diet Carb Modified Fluid consistency: Thin; Room service appropriate? Yes  Diet effective now             EDUCATION NEEDS:   Education needs have been addressed  Skin:  Skin Assessment: Skin Integrity Issues:(closed incision right leg from recent AKA with wound VAC in place)  Last BM:  Unknown  Height:   Ht Readings from Last 1 Encounters:  11/10/19 5\' 9"  (1.753 m)   Weight:   Wt Readings from Last 1 Encounters:  11/18/19 92.8 kg   BMI:  Body mass index is 30.21 kg/m.  Estimated Nutritional Needs:   Kcal:  2300-2500  Protein:  115-125 grams  Fluid:  >/= 2 L/day  Jacklynn Barnacle, MS, RD, LDN Pager number available on Amion

## 2019-11-19 LAB — BASIC METABOLIC PANEL
Anion gap: 6 (ref 5–15)
BUN: 12 mg/dL (ref 8–23)
CO2: 28 mmol/L (ref 22–32)
Calcium: 8.5 mg/dL — ABNORMAL LOW (ref 8.9–10.3)
Chloride: 101 mmol/L (ref 98–111)
Creatinine, Ser: 0.46 mg/dL — ABNORMAL LOW (ref 0.61–1.24)
GFR calc Af Amer: >60 mL/min (ref >60)
GFR calc non Af Amer: >60 mL/min (ref >60)
Glucose, Bld: 140 mg/dL — ABNORMAL HIGH (ref 70–99)
Potassium: 3.9 mmol/L (ref 3.5–5.1)
Sodium: 135 mmol/L (ref 135–145)

## 2019-11-19 LAB — CBC
HCT: 28.6 % — ABNORMAL LOW (ref 39.0–52.0)
HCT: 31 % — ABNORMAL LOW (ref 39.0–52.0)
Hemoglobin: 8.4 g/dL — ABNORMAL LOW (ref 13.0–17.0)
Hemoglobin: 9.1 g/dL — ABNORMAL LOW (ref 13.0–17.0)
MCH: 25.1 pg — ABNORMAL LOW (ref 26.0–34.0)
MCH: 25.4 pg — ABNORMAL LOW (ref 26.0–34.0)
MCHC: 29.4 g/dL — ABNORMAL LOW (ref 30.0–36.0)
MCHC: 29.4 g/dL — ABNORMAL LOW (ref 30.0–36.0)
MCV: 85.6 fL (ref 80.0–100.0)
MCV: 86.6 fL (ref 80.0–100.0)
Platelets: 116 10*3/uL — ABNORMAL LOW (ref 150–400)
Platelets: 120 10*3/uL — ABNORMAL LOW (ref 150–400)
RBC: 3.34 MIL/uL — ABNORMAL LOW (ref 4.22–5.81)
RBC: 3.58 MIL/uL — ABNORMAL LOW (ref 4.22–5.81)
RDW: 18.9 % — ABNORMAL HIGH (ref 11.5–15.5)
RDW: 19.2 % — ABNORMAL HIGH (ref 11.5–15.5)
WBC: 9.3 10*3/uL (ref 4.0–10.5)
WBC: 10.7 10*3/uL — ABNORMAL HIGH (ref 4.0–10.5)
nRBC: 0 % (ref 0.0–0.2)
nRBC: 0 % (ref 0.0–0.2)

## 2019-11-19 LAB — GLUCOSE, CAPILLARY
Glucose-Capillary: 132 mg/dL — ABNORMAL HIGH (ref 70–99)
Glucose-Capillary: 173 mg/dL — ABNORMAL HIGH (ref 70–99)
Glucose-Capillary: 152 mg/dL — ABNORMAL HIGH (ref 70–99)
Glucose-Capillary: 149 mg/dL — ABNORMAL HIGH (ref 70–99)

## 2019-11-19 NOTE — Progress Notes (Signed)
PROGRESS NOTE    Quayshaun B Steinborn Jr.   VX:[id number]  DOB: 04-14-1956  DOA: 11/10/2019 PCP: [name], DO   Brief Narrative:  [name] is a 64 y.o. 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 He was started on IV antibiotics.  Also noted was that he was in A-fib with RVR for which he was started on a Cardizem infusion.   Subjective: Pain in right leg- no new complaints.     Assessment & Plan:   Active Problems:   Sepsis with right stump infection after AKA - appreciate ID and vascular surgery assistance- - wound culture result on 4/17 > MRSA-  cont Abx per ID->  Unasyn and Zyvox - s/p I and D - cont wound vac with changes on MWF -  4/16> OK to resume Eliquis per Dr Lucky Cowboy  - nutrition consult to add protein for wound healing - awaiting final recommendations from vascular surgery  Hypokalemia - replaced  Lethargy, confusion (4/14/ 4/15)  - d/c'd Xanax which he does not take at home and he received on the evening of the 13th - CT head  ordered by vascular-neg for new CVA  PAD - s/p angioplasty and multiple stents  Cirrhosis - per wife he has a h/o ETOH abuse but he has stopped drinking completely - only trace ascites on MRI  Hep B surface Ag + and E Ab + - no symptoms of hepatitis- ID recommended obtained Hep B DNR which I ordered on 4/15- it is in process - I spoke with Dr Vicente Males who recommended outpt follow up (the patient's wife states he is his hepatologist)  Multiple liver lesions - cont outpatient surveillance of these with serial MRI  - AFP < 0.9  Permanent A-Fib - on Metoprolol - HR rapid - increased to 50 BID- HR better controlled- BP has remained stable - Eliquis was on hold as plt quite low (31) - resumed on  4/16  Thrombocytopenia - possibly related to cirrhosis with superimposed sepsis - platelets 31 > 60 > 72> 116 - follow platelets on Zyvox  DM2 - on Jardiance as outpt- A1C IS 7.7 - cont SSI- sugars very well controlled  Edema of left leg - place TEDS  Time spent in minutes: 35 DVT prophylaxis: holding Code Status: Full code Family Communication: wife  Disposition Plan:  From home- not ready to d/c due to acute infection- awaiting final recommendations from vascular- ID states he will not need IV Abx on d/c - will need SNF - TOC consulted on 4/15 Consultants:   ID  Vascular surgery Procedures:   I and D Antimicrobials:  Anti-infectives (From admission, onward)   Start     Dose/Rate Route Frequency Ordered Stop   11/17/19 1800  linezolid (ZYVOX) tablet 600 mg     600 mg Oral Every 12 hours 11/17/19 1406     11/16/19 1800  ampicillin-sulbactam (UNASYN) 1.5 g in sodium chloride 0.9 % 100 mL IVPB     1.5 g 200 mL/hr over 30 Minutes Intravenous Every 6 hours 11/16/19 1418     11/13/19 2200  cefTRIAXone (ROCEPHIN) 2 g in sodium chloride 0.9 % 100 mL IVPB  Status:  Discontinued     2 g 200 mL/hr over 30 Minutes  Intravenous Every 24 hours 11/13/19 2007 11/16/19 1418   11/13/19 2200  metroNIDAZOLE (FLAGYL) IVPB 500 mg  Status:  Discontinued     500 mg 100 mL/hr over 60 Minutes Intravenous Every 8 hours 11/13/19 2007 11/16/19 1418   11/11/19 1400  vancomycin (VANCOREADY) IVPB 1250 mg/250 mL  Status:  Discontinued     1,250 mg 166.7 mL/hr over 90 Minutes Intravenous Every 12 hours 11/11/19 0158 11/17/19 1406   11/11/19 0600  piperacillin-tazobactam (ZOSYN) IVPB 3.375 g  Status:  Discontinued     3.375 g 12.5 mL/hr over 240 Minutes Intravenous Every 8 hours 11/11/19 0322 11/13/19 2007   11/11/19 0215  vancomycin (VANCOCIN) IVPB 1000 mg/200 mL premix  Status:  Discontinued     1,000 mg 200 mL/hr over 60 Minutes Intravenous  Once 11/11/19 0214 11/11/19 0321   11/11/19 0215   piperacillin-tazobactam (ZOSYN) IVPB 3.375 g  Status:  Discontinued     3.375 g 100 mL/hr over 30 Minutes Intravenous Every 6 hours 11/11/19 0214 11/11/19 0321   11/11/19 0015  vancomycin (VANCOCIN) IVPB 1000 mg/200 mL premix     1,000 mg 200 mL/hr over 60 Minutes Intravenous  Once 11/11/19 0010 11/11/19 0252   11/10/19 2345  vancomycin (VANCOCIN) IVPB 1000 mg/200 mL premix     1,000 mg 200 mL/hr over 60 Minutes Intravenous  Once 11/10/19 2342 11/11/19 0127   11/10/19 2345  cefTRIAXone (ROCEPHIN) 2 g in sodium chloride 0.9 % 100 mL IVPB     2 g 200 mL/hr over 30 Minutes Intravenous  Once 11/10/19 2342 11/11/19 0131       Objective: Vitals:   11/18/19 2001 11/19/19 0417 11/19/19 0751 11/19/19 1141  BP: 120/78 114/71 123/73 127/80  Pulse: (!) 105 97 93 89  Resp:   18 18  Temp: 98.6 F (37 C) 98.7 F (37.1 C) 98.6 F (37 C) 98.8 F (37.1 C)  TempSrc: Oral Oral Oral Oral  SpO2: 98% 99% 99% 99%  Weight:  93 kg    Height:        Intake/Output Summary (Last 24 hours) at 11/19/2019 1200 Last data filed at 11/19/2019 0926 Gross per 24 hour  Intake 120 ml  Output 13 ml  Net 107 ml   Filed Weights   11/17/19 0458 11/18/19 0458 11/19/19 0417  Weight: 92.5 kg 92.8 kg 93 kg    Examination: General exam: Appears comfortable  HEENT: PERRLA, oral mucosa moist, no sclera icterus or thrush Respiratory system: Clear to auscultation. Respiratory effort normal. Cardiovascular system: S1 & S2 heard,  No murmurs  Gastrointestinal system: Abdomen soft, non-tender, nondistended. Normal bowel sounds   Central nervous system: Alert and oriented. No focal neurological deficits. Extremities: No cyanosis, clubbing -  wound vac on right stump- edema of left leg Skin: No rashes or ulcers Psychiatry:  Mood & affect appropriate.      Data Reviewed: I have personally reviewed following labs and imaging studies  CBC: Recent Labs  Lab 11/14/19 0356 11/15/19 0623 11/16/19 0932 11/17/19 0605  11/19/19 0337  WBC 13.6* 13.6* 10.9* 10.6* 9.3  HGB 9.2* 8.7* 9.2* 8.3* 8.4*  HCT 30.3* 28.8* 30.2* 27.5* 28.6*  MCV 83.5 84.5 83.2 84.6 85.6  PLT 41* 31* 60* 72* [id number]*   Basic Metabolic Panel: Recent Labs  Lab 11/15/19 0623 11/16/19 0932 11/17/19 0605 11/18/19 0941 11/19/19 0337  NA 137 136 139 134* 135  K 3.8 3.0* 3.1* 3.8 3.9  CL 101 98 101 100 101  CO2 26 29  28 27 28   GLUCOSE 166* 204* 142* 169* 140*  BUN 15 13 12 13 12   CREATININE 0.58* 0.53* 0.57* 0.54* 0.46*  CALCIUM 8.1* 8.4* 8.2* 8.2* 8.5*  MG  --   --  1.7  --   --    GFR: Estimated Creatinine Clearance: 103.6 mL/min (A) (by C-G formula based on SCr of 0.46 mg/dL (L)). Liver Function Tests: Recent Labs  Lab 11/13/19 0349 11/14/19 0356 11/15/19 0623  AST 201* 96* 63*  ALT 195* 138* 94*  ALKPHOS 186* 181* 160*  BILITOT 1.8* 1.4* 2.2*  PROT 6.3* 6.3* 5.8*  ALBUMIN 2.3* 2.3* 2.2*   No results for input(s): LIPASE, AMYLASE in the last 168 hours. No results for input(s): AMMONIA in the last 168 hours. Coagulation Profile: Recent Labs  Lab 11/13/19 0723  INR 2.8*   Cardiac Enzymes: No results for input(s): CKTOTAL, CKMB, CKMBINDEX, TROPONINI in the last 168 hours. BNP (last 3 results) No results for input(s): PROBNP in the last 8760 hours. HbA1C: No results for input(s): HGBA1C in the last 72 hours. CBG: Recent Labs  Lab 11/18/19 1720 11/18/19 2013 11/18/19 2101 11/19/19 0753 11/19/19 1142  GLUCAP 155* 142* 154* 132* 173*   Lipid Profile: No results for input(s): CHOL, HDL, LDLCALC, TRIG, CHOLHDL, LDLDIRECT in the last 72 hours. Thyroid Function Tests: No results for input(s): TSH, T4TOTAL, FREET4, T3FREE, THYROIDAB in the last 72 hours. Anemia Panel: No results for input(s): VITAMINB12, FOLATE, FERRITIN, TIBC, IRON, RETICCTPCT in the last 72 hours. Urine analysis:    Component Value Date/Time   COLORURINE AMBER (A) 11/10/2019 2357   APPEARANCEUR HAZY (A) 11/10/2019 2357   APPEARANCEUR  Clear 07/03/2019 1513   LABSPEC 1.022 11/10/2019 2357   PHURINE 5.0 11/10/2019 2357   GLUCOSEU >=500 (A) 11/10/2019 2357   HGBUR SMALL (A) 11/10/2019 2357   BILIRUBINUR NEGATIVE 11/10/2019 2357   BILIRUBINUR Negative 07/03/2019 1513   KETONESUR 20 (A) 11/10/2019 2357   PROTEINUR 30 (A) 11/10/2019 2357   NITRITE NEGATIVE 11/10/2019 2357   LEUKOCYTESUR NEGATIVE 11/10/2019 2357   Sepsis Labs: @LABRCNTIP (procalcitonin:4,lacticidven:4) ) Recent Results (from the past 240 hour(s))  Blood Culture (routine x 2)     Status: None   Collection Time: 11/10/19 11:57 PM   Specimen: BLOOD  Result Value Ref Range Status   Specimen Description BLOOD BLOOD LEFT HAND  Final   Special Requests   Final    BOTTLES DRAWN AEROBIC AND ANAEROBIC Blood Culture results may not be optimal due to an excessive volume of blood received in culture bottles   Culture   Final    NO GROWTH 5 DAYS Performed at [facility], [address] [place], [place] [address]    Report Status 11/16/2019 FINAL  Final  Blood Culture (routine x 2)     Status: None   Collection Time: 11/10/19 11:57 PM   Specimen: BLOOD  Result Value Ref Range Status   Specimen Description BLOOD BLOOD RIGHT HAND  Final   Special Requests   Final    BOTTLES DRAWN AEROBIC AND ANAEROBIC Blood Culture results may not be optimal due to an inadequate volume of blood received in culture bottles   Culture   Final    NO GROWTH 5 DAYS Performed at [facility], [address], [place], [place] [address]    Report Status 11/16/2019 FINAL  Final  Urine culture     Status: Abnormal   Collection Time: 11/10/19 11:57 PM   Specimen: In/Out Cath Urine  Result Value Ref Range Status   Specimen Description   Final    IN/OUT CATH URINE Performed at [facility], [address] [place], [place] [address]    Special Requests   Final    NONE Performed at [facility], [address] [place], [place] [address]     Culture MULTIPLE SPECIES PRESENT, SUGGEST RECOLLECTION (A)  Final   Report Status 11/12/2019 FINAL  Final  Aerobic/Anaerobic Culture (surgical/deep wound)     Status: Abnormal   Collection Time: 11/10/19 11:57 PM   Specimen: Leg; Wound  Result Value Ref Range Status   Specimen Description   Final    LEG RIGHT Performed at [facility], [address] [place], [place] [address]    Special Requests   Final    Normal Performed at [facility], [address] [place], [place] [address]    Gram Stain NO WBC SEEN MODERATE GRAM POSITIVE RODS   Final   Culture (A)  Final    MULTIPLE ORGANISMS PRESENT, NONE PREDOMINANT NO ANAEROBES ISOLATED Performed at [facility], [place] [address], [place], [place] [address]    Report Status 11/17/2019 FINAL  Final  Respiratory Panel by RT PCR (Flu A&B, Covid) - Nasopharyngeal Swab     Status: None   Collection Time: 11/11/19  1:46 AM   Specimen: Nasopharyngeal Swab  Result Value Ref Range Status   SARS Coronavirus 2 by RT PCR NEGATIVE NEGATIVE Final    Comment: (NOTE) SARS-CoV-2 target nucleic acids are NOT DETECTED. The SARS-CoV-2 RNA is generally detectable in upper respiratoy specimens during the acute phase of infection. The lowest concentration of SARS-CoV-2 viral copies this assay can detect is 131 copies/mL. A negative result does not preclude SARS-Cov-2 infection and should not be used as the sole basis for treatment or other patient management decisions. A negative result may occur with  improper specimen collection/handling, submission of specimen other than nasopharyngeal swab, presence of viral mutation(s) within the areas targeted by this assay, and inadequate number of viral copies (<131 copies/mL). A negative result must be combined with clinical observations, patient history, and epidemiological information. The expected result is Negative. Fact Sheet for Patients:   PinkCheek.be Fact Sheet for Healthcare Providers:  GravelBags.it This test is not yet ap proved or cleared by the [place] FDA and  has been authorized for detection and/or diagnosis of SARS-CoV-2 by FDA under an Emergency Use Authorization (EUA). This EUA will remain  in effect (meaning this test can be used) for the duration of the COVID-19 declaration under Section 564(b)(1) of the Act, 21 U.S.C. section 360bbb-3(b)(1), unless the authorization is terminated or revoked sooner.    Influenza A by PCR NEGATIVE NEGATIVE Final   Influenza B by PCR NEGATIVE NEGATIVE Final    Comment: (NOTE) The Xpert Xpress SARS-CoV-2/FLU/RSV assay is intended as an aid in  the diagnosis of influenza from Nasopharyngeal swab specimens and  should not be used as a sole basis for treatment. Nasal washings and  aspirates are unacceptable for Xpert Xpress SARS-CoV-2/FLU/RSV  testing. Fact Sheet for Patients: PinkCheek.be Fact Sheet for Healthcare Providers: GravelBags.it This test is not yet approved or cleared by the [place] FDA and  has been authorized for detection and/or diagnosis of SARS-CoV-2 by  FDA under an Emergency Use Authorization (EUA). This EUA will remain  in effect (meaning this test can be used) for the duration of the  Covid-19 declaration under Section 564(b)(1) of the Act,  21  U.S.C. section 360bbb-3(b)(1), unless the authorization is  terminated or revoked. Performed at [facility], [address] [place], [place] [address]   Aerobic/Anaerobic Culture (surgical/deep wound)     Status: None   Collection Time: 11/11/19  4:44 PM   Specimen: PATH Amputaion Arm/Leg; Tissue  Result Value Ref Range Status   Specimen Description   Final    WOUND Performed at [facility], [address] [place], [place] [address]    Special Requests    Final    NONE Performed at [facility], [address] [place], [place] [address]    Gram Stain   Final    FEW WBC PRESENT, PREDOMINANTLY PMN FEW GRAM POSITIVE COCCI IN CLUSTERS RARE GRAM NEGATIVE RODS Performed at [facility], [place] [address] [place], [place] [address]    Culture   Final    FEW METHICILLIN RESISTANT STAPHYLOCOCCUS AUREUS MODERATE ENTEROCOCCUS FAECALIS NO ANAEROBES ISOLATED CORRECTED RESULTS PREVIOUSLY REPORTED AS: STAPHYLOCOCCUS SPECIES (COAGULASE NEGATIVE) CORRECTED RESULTS CALLED TO: A. RICHEY RN, AT [id number] 11/17/19 BY D. VANHOOK MULTIPLE ORGANISMS PRESENT, NONE PREDOMINANT    Report Status 11/18/2019 FINAL  Final   Organism ID, Bacteria METHICILLIN RESISTANT STAPHYLOCOCCUS AUREUS  Final   Organism ID, Bacteria ENTEROCOCCUS FAECALIS  Final      Susceptibility   Enterococcus faecalis - MIC*    AMPICILLIN <=2 SENSITIVE Sensitive     VANCOMYCIN 2 SENSITIVE Sensitive     GENTAMICIN SYNERGY SENSITIVE Sensitive     * MODERATE ENTEROCOCCUS FAECALIS   Methicillin resistant staphylococcus aureus - MIC*    CIPROFLOXACIN >=8 RESISTANT Resistant     ERYTHROMYCIN >=8 RESISTANT Resistant     GENTAMICIN <=0.5 SENSITIVE Sensitive     OXACILLIN >=4 RESISTANT Resistant     TETRACYCLINE <=1 SENSITIVE Sensitive     VANCOMYCIN <=0.5 SENSITIVE Sensitive     TRIMETH/SULFA <=10 SENSITIVE Sensitive     CLINDAMYCIN <=0.25 SENSITIVE Sensitive     RIFAMPIN <=0.5 SENSITIVE Sensitive     Inducible Clindamycin NEGATIVE Sensitive     * FEW METHICILLIN RESISTANT STAPHYLOCOCCUS AUREUS         Radiology Studies: No results found.    Scheduled Meds: . apixaban  5 mg Oral BID  . vitamin C  500 mg Oral Daily  . cholecalciferol  5,000 Units Oral Daily  . docusate sodium  100 mg Oral Daily  . ferrous sulfate  325 mg Oral BID WC  . gabapentin  1,200 mg Oral TID  . insulin aspart  0-15 Units Subcutaneous TID PC & HS  . linezolid  600 mg Oral Q12H  . metoprolol  tartrate  50 mg Oral BID  . multivitamin with minerals  1 tablet Oral Daily  . nutrition supplement (JUVEN)  1 packet Oral BID WC  . pantoprazole  20 mg Oral Daily  . Ensure Max Protein  11 oz Oral BID BM   Continuous Infusions: . sodium chloride 1,000 mL (11/17/19 0230)  . ampicillin-sulbactam (UNASYN) IV 1.5 g (11/19/19 0656)     LOS: 8 days      Debbe Odea, MD [facility] Pager: [email or web address] 11/19/2019, 12:00 PM

## 2019-11-19 NOTE — Plan of Care (Signed)

## 2019-11-20 ENCOUNTER — Ambulatory Visit (INDEPENDENT_AMBULATORY_CARE_PROVIDER_SITE_OTHER): Payer: Medicare HMO | Admitting: Nurse Practitioner

## 2019-11-20 DIAGNOSIS — B9562 Methicillin resistant Staphylococcus aureus infection as the cause of diseases classified elsewhere: Secondary | ICD-10-CM

## 2019-11-20 DIAGNOSIS — B952 Enterococcus as the cause of diseases classified elsewhere: Secondary | ICD-10-CM

## 2019-11-20 LAB — IRON AND TIBC
Iron: 36 ug/dL — ABNORMAL LOW (ref 45–182)
Saturation Ratios: 14 % — ABNORMAL LOW (ref 17.9–39.5)
TIBC: 256 ug/dL (ref 250–450)
UIBC: 220 ug/dL

## 2019-11-20 LAB — RETICULOCYTES
Immature Retic Fract: 27 % — ABNORMAL HIGH (ref 2.3–15.9)
RBC.: 3.7 MIL/uL — ABNORMAL LOW (ref 4.22–5.81)
Retic Count, Absolute: 189.5 10*3/uL — ABNORMAL HIGH (ref 19.0–186.0)
Retic Ct Pct: 5.1 % — ABNORMAL HIGH (ref 0.4–3.1)

## 2019-11-20 LAB — FOLATE: Folate: 20.5 ng/mL (ref >5.9)

## 2019-11-20 LAB — RESPIRATORY PANEL BY RT PCR (FLU A&B, COVID)
Influenza A by PCR: NEGATIVE
Influenza B by PCR: NEGATIVE
SARS Coronavirus 2 by RT PCR: NEGATIVE

## 2019-11-20 LAB — VITAMIN B12: Vitamin B-12: 2464 pg/mL — ABNORMAL HIGH (ref 180–914)

## 2019-11-20 LAB — GLUCOSE, CAPILLARY
Glucose-Capillary: 140 mg/dL — ABNORMAL HIGH (ref 70–99)
Glucose-Capillary: 150 mg/dL — ABNORMAL HIGH (ref 70–99)
Glucose-Capillary: 152 mg/dL — ABNORMAL HIGH (ref 70–99)
Glucose-Capillary: 135 mg/dL — ABNORMAL HIGH (ref 70–99)

## 2019-11-20 LAB — MRSA PCR SCREENING: MRSA by PCR: POSITIVE — AB

## 2019-11-20 LAB — FERRITIN: Ferritin: 77 ng/mL (ref 24–336)

## 2019-11-20 MED ORDER — VITAMIN B-6 50 MG PO TABS
50.0000 mg | ORAL_TABLET | Freq: Every day | ORAL | Status: DC
  Administered 2019-11-20 – 2019-11-27 (×8): 50 mg via ORAL
  Filled 2019-11-20 (×8): qty 1

## 2019-11-20 MED ORDER — CHLORHEXIDINE GLUCONATE CLOTH 2 % EX PADS
6.0000 | MEDICATED_PAD | Freq: Every day | CUTANEOUS | Status: AC
  Administered 2019-11-20 – 2019-11-24 (×4): 6 via TOPICAL

## 2019-11-20 MED ORDER — MUPIROCIN 2 % EX OINT
1.0000 "application " | TOPICAL_OINTMENT | Freq: Two times a day (BID) | CUTANEOUS | Status: AC
  Administered 2019-11-20 – 2019-11-24 (×9): 1 via NASAL
  Filled 2019-11-20 (×2): qty 22

## 2019-11-20 MED ORDER — SODIUM CHLORIDE 0.9% FLUSH
10.0000 mL | Freq: Two times a day (BID) | INTRAVENOUS | Status: DC
  Administered 2019-11-20 – 2019-11-29 (×9): 10 mL via INTRAVENOUS

## 2019-11-20 MED ORDER — AMOXICILLIN-POT CLAVULANATE 875-125 MG PO TABS
1.0000 | ORAL_TABLET | Freq: Two times a day (BID) | ORAL | Status: DC
  Administered 2019-11-20 – 2019-11-29 (×19): 1 via ORAL
  Filled 2019-11-20 (×19): qty 1

## 2019-11-20 MED ORDER — RISAQUAD PO CAPS
1.0000 | ORAL_CAPSULE | Freq: Every day | ORAL | Status: DC
  Administered 2019-11-20 – 2019-11-29 (×10): 1 via ORAL
  Filled 2019-11-20 (×10): qty 1

## 2019-11-20 NOTE — Progress Notes (Signed)
[facility] Daily Progress Note   Subjective: 11/11/19: 1. Irrigation and debridement ofright AKA stump - skin and soft tissue  Patient with continued right stump pain. More alert.  Objective: Vitals:   11/20/19 0129 11/20/19 0209 11/20/19 0415 11/20/19 0807  BP:  111/77 115/72 116/69  Pulse:  97  74  Resp:  20  19  Temp:  97.6 F (36.4 C)  98.3 F (36.8 C)  TempSrc:  Oral  Oral  SpO2:  96% 100% 100%  Weight: 93.4 kg     Height:        Intake/Output Summary (Last 24 hours) at 11/20/2019 1108 Last data filed at 11/20/2019 0417 Gross per 24 hour  Intake 413.47 ml  Output 100 ml  Net 313.47 ml   Physical Exam: A&Ox3, NAD CV: RRR Pulmonary: CTA Bilaterally Abdomen: Soft, Nontender, Nondistended Vascular:  Right Lower Extremity: Thigh edematous.  VAC change with the assistance of wound nurse.  Would estimate 85% granulation tissue. No foul odor.      Document Information Photos    11/20/2019 09:57  Attached To:  Hospital Encounter on 11/10/19  Source Information Sela Hua, PA-C  [facility]   Laboratory: CBC    Component Value Date/Time   WBC 10.7 (H) 11/19/2019 1238   HGB 9.1 (L) 11/19/2019 1238   HGB 11.2 (L) 10/23/2019 1403   HCT 31.0 (L) 11/19/2019 1238   HCT 35.0 (L) 10/23/2019 1403   PLT 120 (L) 11/19/2019 1238   PLT 199 10/23/2019 1403   BMET    Component Value Date/Time   NA 135 11/19/2019 0337   NA 140 10/23/2019 1403   K 3.9 11/19/2019 0337   CL 101 11/19/2019 0337   CO2 28 11/19/2019 0337   GLUCOSE 140 (H) 11/19/2019 0337   BUN 12 11/19/2019 0337   BUN 17 10/23/2019 1403   CREATININE 0.46 (L) 11/19/2019 0337   CALCIUM 8.5 (L) 11/19/2019 0337   GFRNONAA >60 11/19/2019 0337   GFRAA >60 11/19/2019 [id number]   Assessment/Planning: 64 year old male who presented to the [facility] emergency departments/pright above-the-knee amputation(10/26/19),taken to the [facility] 4/10/21for  washout debridement(11/11/19)  Status post washout and debridement- (11/11/19) ORVAC removedon 11/14/19  Next change on Wed.  VAC M-W-F. Continue 125, continuous. Had long discussion with patient and his wife who is at bedside in regard to the need to get out of bed and start ambulating.  Discussed how his wound will not heal with constant pressure of lying in bed. Encouraged frequent position changes and offloading weight on back of stump. No plans for surgical intervention during this inpatient stay. We will continue to follow the wound in the outpatient setting.  2) Sepsis: ID following On IV ABX ID asking for CBC to be ordered at first post-op visit - message passed along to our NP  Discussed with Dr. [facility] PA-C 11/20/2019 11:08 AM

## 2019-11-20 NOTE — Progress Notes (Signed)
Physical Therapy Treatment Patient Details Name: [name]. MRN: [id number] DOB: 1956/04/17 Today's Date: 11/20/2019    History of Present Illness Pt admitted for sepsis secondary to wound infection and cellulitis. Pt is now s/p I&D of R residual limb on 11/12/19. 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 Other PMH includes expressive aphasia s/p L PCA CVA, Afib, and GERD.    PT Comments    Pt in room with wife.  C/o pain "all over"  Pt educated and agreed with mod encouragement to complete supine ex.  While he required assist he did complete x 10 SLR, ab/add and heel slides LLE but needed constant tactile and verbal cues.  He moves RLE minimally completing only 3 SLR.  Pt agitated during session "Help me baby" to his wife.  He is eventually able to verbalize his need to have a BM.  Bedpan obtained but he is unwilling to roll left or right to allow placement despite encouragement from Probation officer and wife.  Discussed with RN pt's request for pain medication if allowed.  Session very limited today despite increased time in room.   Follow Up Recommendations  SNF     Equipment Recommendations  None recommended by PT    Recommendations for Other Services       Precautions / Restrictions Restrictions Weight Bearing Restrictions: No RLE Weight Bearing: Non weight bearing    Mobility  Bed Mobility                  Transfers                    Ambulation/Gait                 Stairs             Wheelchair Mobility    Modified Rankin (Stroke Patients Only)       Balance                                            Cognition Arousal/Alertness: Awake/alert Behavior During Therapy: Agitated;Restless Overall Cognitive Status: Impaired/Different from baseline                                        Exercises      General Comments        Pertinent Vitals/Pain Pain  Assessment: Faces Faces Pain Scale: Hurts whole lot Pain Location: "all over" Pain Descriptors / Indicators: Moaning;Grimacing;Guarding;Restless Pain Intervention(s): Limited activity within patient's tolerance;Monitored during session;Patient requesting pain meds-RN notified    Home Living                      Prior Function            PT Goals (current goals can now be found in the care plan section) Progress towards PT goals: Not progressing toward goals - comment    Frequency    Min 2X/week      PT Plan Current plan remains appropriate    Co-evaluation              AM-PAC PT "6 Clicks" Mobility   Outcome Measure  Help needed turning from your back to your side while in a flat  bed without using bedrails?: A Lot Help needed moving from lying on your back to sitting on the side of a flat bed without using bedrails?: A Lot Help needed moving to and from a bed to a chair (including a wheelchair)?: A Lot Help needed standing up from a chair using your arms (e.g., wheelchair or bedside chair)?: Total Help needed to walk in hospital room?: Total Help needed climbing 3-5 steps with a railing? : Total 6 Click Score: 9    End of Session   Activity Tolerance: Patient limited by pain Patient left: in bed;with bed alarm set;with call bell/phone within reach;with family/visitor present Nurse Communication: Patient requests pain meds       Time: [id number] PT Time Calculation (min) (ACUTE ONLY): 23 min  Charges:  $Therapeutic Exercise: 8-22 mins $Therapeutic Activity: 8-22 mins                    Chesley Noon, PTA 11/20/19, 2:41 PM

## 2019-11-20 NOTE — Progress Notes (Signed)
ID [name].is a 63 y.o.malewith a history of CVA, blind rt eye, PAD, rt foot gangrene,Rt leg BKA recently underwent Rt above knee amputation on 10/26/19 for a non healing BKA stump which was present for a year. He was admitted on 11/10/19 with declining health and altered mental status. Also had purulent drainage from the stump and fever of 101.4 on admission- on 4/10 he was taken for surgery by Dr. Lorenso Courier His note mentions there was Necrosis of suture line and posterior flap; purulent drainage and medial  abscess pocket underwent  Irrigation and debridement of RT AKA stump skin and soft tissue His note reads as below The wound was then opened and excisional debridement was performed to the skin and soft tissue to remove all clearly non-viable tissue.  The tissue was taken back to bleeding tissue that appeared viable.  There was oozing from all surfaces as the patient is on Eliquis. Adequate Hemostasis was obtained. The debridement was performed with pulse lavage, curette and nonviable tissue was excised with #15 blade and electrocautery and encompassed an area of approximately 15 cm2  Pt in bed Has not moved much  No fever   Patient Vitals for the past 24 hrs:  BP Temp Temp src Pulse Resp SpO2 Weight  11/20/19 0807 116/69 98.3 F (36.8 C) Oral 74 19 100 % --  11/20/19 0415 115/72 -- -- -- -- 100 % --  11/20/19 0209 111/77 97.6 F (36.4 C) Oral 97 20 96 % --  11/20/19 0129 -- -- -- -- -- -- 93.4 kg  11/19/19 2050 132/74 -- -- (!) 104 -- 99 % --  11/19/19 2048 -- 98.9 F (37.2 C) Oral -- -- -- --  11/19/19 1626 (!) 116/91 98.5 F (36.9 C) Oral 97 18 99 % --  11/19/19 1141 127/80 98.8 F (37.1 C) Oral 89 18 99 % --    Awake/alert.plae Rt AKA stump wound reviewed   Pre surgery    Labs  Micro 11/10/19 Blood culture negative 4/10 surgical wound culture- MRSA ( was previously reported as MRSE) enterococcus and gram neg rod on the gam  stain  Wbc   Platelet    HB CBC Latest Ref Rng & Units 11/19/2019 11/19/2019 11/17/2019  WBC 4.0 - 10.5 K/uL 10.7(H) 9.3 10.6(H)  Hemoglobin 13.0 - 17.0 g/dL 9.1(L) 8.4(L) 8.3(L)  Hematocrit 39.0 - 52.0 % 31.0(L) 28.6(L) 27.5(L)  Platelets 150 - 400 K/uL 120(L) 116(L) 72(L)    CMP Latest Ref Rng & Units 11/19/2019 11/18/2019 11/17/2019  Glucose 70 - 99 mg/dL 140(H) 169(H) 142(H)  BUN 8 - 23 mg/dL 12 13 12   Creatinine 0.61 - 1.24 mg/dL 0.46(L) 0.54(L) 0.57(L)  Sodium 135 - 145 mmol/L 135 134(L) 139  Potassium 3.5 - 5.1 mmol/L 3.9 3.8 3.1(L)  Chloride 98 - 111 mmol/L 101 100 101  CO2 22 - 32 mmol/L 28 27 28   Calcium 8.9 - 10.3 mg/dL 8.5(L) 8.2(L) 8.2(L)  Total Protein 6.5 - 8.1 g/dL - - -  Total Bilirubin 0.3 - 1.2 mg/dL - - -  Alkaline Phos 38 - 126 U/L - - -  AST 15 - 41 U/L - - -  ALT 0 - 44 U/L - - -    Impression/recommendation Infected AKA stump with pressure ischemia  And purulence of previous flap s/p debridement of skin and soft tissue-  Looks like bone was not involved MRSA/enteroccus in the surgical wound culture= Currently on Linezolid 600mg   PO BID  and unasyn as  there was gram neg rod seen on the stain but culture neg So can change unasyn to augmentin 875 mg PO BID Both to be given PO for a total of 2 weeks ( 4./30/21), along with probiotic   Chronic active hepatitis B infection- cirrhosis May benefit from G/hepatology follow up  Thrombocytopenia  Has improved Observe platelets closely while on linezolid  Leucocytosis due to infected Rt AKA - resolved  Afib on eliquis and metoprolol CVA - rt eye vision loss  Anemia  While on Linezolid,before any other new medication  is being started will have to drug- drug interactions as a few meds like SSRI/pseudoephidrine can cause Serotonin syndrome Also while on linezolid check CBC with diff every week for 2 weeks to watch for thrombocytopenia, worsening /anemia  As linezolid can cause Bone marrow suppression.  Will give  B6 50mg  Qd   as well  Discussed the management with the wife and care team

## 2019-11-20 NOTE — Progress Notes (Addendum)
PROGRESS NOTE    Glenn B Kuechle Jr.   VX:[id number]  DOB: September 16, 1955  DOA: 11/10/2019 PCP: [name], DO   Brief Narrative:  Glenn Gerold. is a 64 y.o. 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 He was started on IV antibiotics.  Also noted was that he was in A-fib with RVR for which he was started on a Cardizem infusion.   Subjective: Ongoing pain in right leg.    Assessment & Plan:   Active Problems:   Sepsis with right stump infection after AKA - appreciate ID and vascular surgery assistance- - wound culture result on 4/17 > MRSA-  cont Abx per ID->  Unasyn and Zyvox - s/p I and D - cont wound vac with changes on MWF -  4/16> OK to resume Eliquis per Dr Lucky Cowboy  - nutrition consulted to add protein for wound healing -  vascular surgery recommends no further interventions and d/c to SNF with wound vac - ID recommends Linezolid and Augmentin until 4/30  Anemia, with Iron deficiency - per wife he was supposed to have his iron checked today and IV Iron transfused- I will not transfuse IV Iron in setting of above infection but we can start oral- will order anemia panel for today  Hypokalemia - replaced  Lethargy, confusion (4/14/ 4/15)  - d/c'd Xanax which he does not take at home and he received on the evening of the 13th - CT head  ordered by vascular-neg for new CVA  PAD - s/p angioplasty and multiple stents  Cirrhosis - per wife he has a h/o ETOH abuse but he has stopped drinking completely - only trace ascites on MRI  Hep B surface Ag + and E Ab + - no symptoms of hepatitis- ID recommended obtained Hep B DNR which I ordered on 4/15- it is in process - I spoke with Dr Vicente Males who recommended outpt follow up (the patient's wife  states he is his hepatologist)  Multiple liver lesions - cont outpatient surveillance of these with serial MRI  - AFP < 0.9  Permanent A-Fib - on Metoprolol - HR rapid - increased to 50 BID- HR better controlled- BP has remained stable - Eliquis was on hold as plt quite low (31) - resumed on 4/16  Thrombocytopenia - possibly related to cirrhosis with superimposed sepsis - platelets 31 > 60 > 72> 116 - follow platelets on Zyvox- will check platelets tomorrow  DM2 - on Jardiance as outpt- A1C IS 7.7 - cont SSI- sugars very well controlled  Edema of left leg- chronic - I ordered TEDS but he declines them.  Time spent in minutes: 35 DVT prophylaxis: holding Code Status: Full code Family Communication: wife  Disposition Plan:  From home-  Awaiting insurance auth for SNF- may take 3 days per CM - TOC consulted on 4/15 Consultants:   ID  Vascular surgery Procedures:   I and D Antimicrobials:  Anti-infectives (From admission, onward)   Start     Dose/Rate Route Frequency Ordered Stop   11/20/19 1200  amoxicillin-clavulanate (AUGMENTIN) 875-125 MG per tablet 1 tablet     1 tablet Oral Every 12 hours 11/20/19 1020     11/17/19 1800  linezolid (ZYVOX) tablet 600 mg  600 mg Oral Every 12 hours 11/17/19 1406     11/16/19 1800  ampicillin-sulbactam (UNASYN) 1.5 g in sodium chloride 0.9 % 100 mL IVPB  Status:  Discontinued     1.5 g 200 mL/hr over 30 Minutes Intravenous Every 6 hours 11/16/19 1418 11/20/19 0929   11/13/19 2200  cefTRIAXone (ROCEPHIN) 2 g in sodium chloride 0.9 % 100 mL IVPB  Status:  Discontinued     2 g 200 mL/hr over 30 Minutes Intravenous Every 24 hours 11/13/19 2007 11/16/19 1418   11/13/19 2200  metroNIDAZOLE (FLAGYL) IVPB 500 mg  Status:  Discontinued     500 mg 100 mL/hr over 60 Minutes Intravenous Every 8 hours 11/13/19 2007 11/16/19 1418   11/11/19 1400  vancomycin (VANCOREADY) IVPB 1250 mg/250 mL  Status:  Discontinued     1,250 mg 166.7 mL/hr over  90 Minutes Intravenous Every 12 hours 11/11/19 0158 11/17/19 1406   11/11/19 0600  piperacillin-tazobactam (ZOSYN) IVPB 3.375 g  Status:  Discontinued     3.375 g 12.5 mL/hr over 240 Minutes Intravenous Every 8 hours 11/11/19 0322 11/13/19 2007   11/11/19 0215  vancomycin (VANCOCIN) IVPB 1000 mg/200 mL premix  Status:  Discontinued     1,000 mg 200 mL/hr over 60 Minutes Intravenous  Once 11/11/19 0214 11/11/19 0321   11/11/19 0215  piperacillin-tazobactam (ZOSYN) IVPB 3.375 g  Status:  Discontinued     3.375 g 100 mL/hr over 30 Minutes Intravenous Every 6 hours 11/11/19 0214 11/11/19 0321   11/11/19 0015  vancomycin (VANCOCIN) IVPB 1000 mg/200 mL premix     1,000 mg 200 mL/hr over 60 Minutes Intravenous  Once 11/11/19 0010 11/11/19 0252   11/10/19 2345  vancomycin (VANCOCIN) IVPB 1000 mg/200 mL premix     1,000 mg 200 mL/hr over 60 Minutes Intravenous  Once 11/10/19 2342 11/11/19 0127   11/10/19 2345  cefTRIAXone (ROCEPHIN) 2 g in sodium chloride 0.9 % 100 mL IVPB     2 g 200 mL/hr over 30 Minutes Intravenous  Once 11/10/19 2342 11/11/19 0131       Objective: Vitals:   11/20/19 0129 11/20/19 0209 11/20/19 0415 11/20/19 0807  BP:  111/77 115/72 116/69  Pulse:  97  74  Resp:  20  19  Temp:  97.6 F (36.4 C)  98.3 F (36.8 C)  TempSrc:  Oral  Oral  SpO2:  96% 100% 100%  Weight: 93.4 kg     Height:        Intake/Output Summary (Last 24 hours) at 11/20/2019 1128 Last data filed at 11/20/2019 0417 Gross per 24 hour  Intake 413.47 ml  Output 100 ml  Net 313.47 ml   Filed Weights   11/18/19 0458 11/19/19 0417 11/20/19 0129  Weight: 92.8 kg 93 kg 93.4 kg    Examination: General exam: Appears comfortable  HEENT: PERRLA, oral mucosa moist, no sclera icterus or thrush Respiratory system: Clear to auscultation. Respiratory effort normal. Cardiovascular system: S1 & S2 heard,  No murmurs  Gastrointestinal system: Abdomen soft, non-tender, nondistended. Normal bowel sounds    Central nervous system: Alert and oriented. No focal neurological deficits. Extremities: No cyanosis, clubbing - edema of left leg- wound vac on right stump Skin: No rashes or ulcers Psychiatry:  Mood & affect appropriate.      Data Reviewed: I have personally reviewed following labs and imaging studies  CBC: Recent Labs  Lab 11/15/19 0623 11/16/19 0932 11/17/19 0605 11/19/19 0337 11/19/19 1238  WBC 13.6* 10.9* 10.6*  9.3 10.7*  HGB 8.7* 9.2* 8.3* 8.4* 9.1*  HCT 28.8* 30.2* 27.5* 28.6* 31.0*  MCV 84.5 83.2 84.6 85.6 86.6  PLT 31* 60* 72* 116* [id number]*   Basic Metabolic Panel: Recent Labs  Lab 11/15/19 0623 11/16/19 0932 11/17/19 0605 11/18/19 0941 11/19/19 0337  NA 137 136 139 134* 135  K 3.8 3.0* 3.1* 3.8 3.9  CL 101 98 101 100 101  CO2 26 29 28 27 28   GLUCOSE 166* 204* 142* 169* 140*  BUN 15 13 12 13 12   CREATININE 0.58* 0.53* 0.57* 0.54* 0.46*  CALCIUM 8.1* 8.4* 8.2* 8.2* 8.5*  MG  --   --  1.7  --   --    GFR: Estimated Creatinine Clearance: 103.9 mL/min (A) (by C-G formula based on SCr of 0.46 mg/dL (L)). Liver Function Tests: Recent Labs  Lab 11/14/19 0356 11/15/19 0623  AST 96* 63*  ALT 138* 94*  ALKPHOS 181* 160*  BILITOT 1.4* 2.2*  PROT 6.3* 5.8*  ALBUMIN 2.3* 2.2*   No results for input(s): LIPASE, AMYLASE in the last 168 hours. No results for input(s): AMMONIA in the last 168 hours. Coagulation Profile: No results for input(s): INR, PROTIME in the last 168 hours. Cardiac Enzymes: No results for input(s): CKTOTAL, CKMB, CKMBINDEX, TROPONINI in the last 168 hours. BNP (last 3 results) No results for input(s): PROBNP in the last 8760 hours. HbA1C: No results for input(s): HGBA1C in the last 72 hours. CBG: Recent Labs  Lab 11/19/19 0753 11/19/19 1142 11/19/19 1627 11/19/19 2102 11/20/19 0809  GLUCAP 132* 173* 152* 149* 140*   Lipid Profile: No results for input(s): CHOL, HDL, LDLCALC, TRIG, CHOLHDL, LDLDIRECT in the last 72  hours. Thyroid Function Tests: No results for input(s): TSH, T4TOTAL, FREET4, T3FREE, THYROIDAB in the last 72 hours. Anemia Panel: No results for input(s): VITAMINB12, FOLATE, FERRITIN, TIBC, IRON, RETICCTPCT in the last 72 hours. Urine analysis:    Component Value Date/Time   COLORURINE AMBER (A) 11/10/2019 2357   APPEARANCEUR HAZY (A) 11/10/2019 2357   APPEARANCEUR Clear 07/03/2019 1513   LABSPEC 1.022 11/10/2019 2357   PHURINE 5.0 11/10/2019 2357   GLUCOSEU >=500 (A) 11/10/2019 2357   HGBUR SMALL (A) 11/10/2019 2357   BILIRUBINUR NEGATIVE 11/10/2019 2357   BILIRUBINUR Negative 07/03/2019 1513   KETONESUR 20 (A) 11/10/2019 2357   PROTEINUR 30 (A) 11/10/2019 2357   NITRITE NEGATIVE 11/10/2019 2357   LEUKOCYTESUR NEGATIVE 11/10/2019 2357   Sepsis Labs: @LABRCNTIP (procalcitonin:4,lacticidven:4) ) Recent Results (from the past 240 hour(s))  Blood Culture (routine x 2)     Status: None   Collection Time: 11/10/19 11:57 PM   Specimen: BLOOD  Result Value Ref Range Status   Specimen Description BLOOD BLOOD LEFT HAND  Final   Special Requests   Final    BOTTLES DRAWN AEROBIC AND ANAEROBIC Blood Culture results may not be optimal due to an excessive volume of blood received in culture bottles   Culture   Final    NO GROWTH 5 DAYS Performed at [facility], [address], [place], [place] [address]    Report Status 11/16/2019 FINAL  Final  Blood Culture (routine x 2)     Status: None   Collection Time: 11/10/19 11:57 PM   Specimen: BLOOD  Result Value Ref Range Status   Specimen Description BLOOD BLOOD RIGHT HAND  Final   Special Requests   Final    BOTTLES DRAWN AEROBIC AND ANAEROBIC Blood Culture results may not be optimal due to  an inadequate volume of blood received in culture bottles   Culture   Final    NO GROWTH 5 DAYS Performed at [facility], [address] [place], [place] [address]    Report Status 11/16/2019 FINAL  Final  Urine culture      Status: Abnormal   Collection Time: 11/10/19 11:57 PM   Specimen: In/Out Cath Urine  Result Value Ref Range Status   Specimen Description   Final    IN/OUT CATH URINE Performed at [facility], [address] [place], [place] [address]    Special Requests   Final    NONE Performed at [facility], [address] [place], [place] [address]    Culture MULTIPLE SPECIES PRESENT, SUGGEST RECOLLECTION (A)  Final   Report Status 11/12/2019 FINAL  Final  Aerobic/Anaerobic Culture (surgical/deep wound)     Status: Abnormal   Collection Time: 11/10/19 11:57 PM   Specimen: Leg; Wound  Result Value Ref Range Status   Specimen Description   Final    LEG RIGHT Performed at [facility], [address], [place], [place] [address]    Special Requests   Final    Normal Performed at [facility], [address] [place], [place] [address]    Gram Stain NO WBC SEEN MODERATE GRAM POSITIVE RODS   Final   Culture (A)  Final    MULTIPLE ORGANISMS PRESENT, NONE PREDOMINANT NO ANAEROBES ISOLATED Performed at [facility], [place] [address] [place],  [address]    Report Status 11/17/2019 FINAL  Final  Respiratory Panel by RT PCR (Flu A&B, Covid) - Nasopharyngeal Swab     Status: None   Collection Time: 11/11/19  1:46 AM   Specimen: Nasopharyngeal Swab  Result Value Ref Range Status   SARS Coronavirus 2 by RT PCR NEGATIVE NEGATIVE Final    Comment: (NOTE) SARS-CoV-2 target nucleic acids are NOT DETECTED. The SARS-CoV-2 RNA is generally detectable in upper respiratoy specimens during the acute phase of infection. The lowest concentration of SARS-CoV-2 viral copies this assay can detect is 131 copies/mL. A negative result does not preclude SARS-Cov-2 infection and should not be used as the sole basis for treatment or other patient management decisions. A negative result may occur with  improper specimen collection/handling, submission of  specimen other than nasopharyngeal swab, presence of viral mutation(s) within the areas targeted by this assay, and inadequate number of viral copies (<131 copies/mL). A negative result must be combined with clinical observations, patient history, and epidemiological information. The expected result is Negative. Fact Sheet for Patients:  PinkCheek.be Fact Sheet for Healthcare Providers:  GravelBags.it This test is not yet ap proved or cleared by the [place] FDA and  has been authorized for detection and/or diagnosis of SARS-CoV-2 by FDA under an Emergency Use Authorization (EUA). This EUA will remain  in effect (meaning this test can be used) for the duration of the COVID-19 declaration under Section 564(b)(1) of the Act, 21 U.S.C. section 360bbb-3(b)(1), unless the authorization is terminated or revoked sooner.    Influenza A by PCR NEGATIVE NEGATIVE Final   Influenza B by PCR NEGATIVE NEGATIVE Final    Comment: (NOTE) The Xpert Xpress SARS-CoV-2/FLU/RSV assay is intended as an aid in  the diagnosis of influenza from Nasopharyngeal swab specimens and  should not be used as a sole basis for treatment. Nasal washings and  aspirates are unacceptable for Xpert Xpress SARS-CoV-2/FLU/RSV  testing. Fact Sheet for Patients: PinkCheek.be  Fact Sheet for Healthcare Providers: GravelBags.it This test is not yet approved or cleared by the [place] FDA and  has been authorized for detection and/or diagnosis of SARS-CoV-2 by  FDA under an Emergency Use Authorization (EUA). This EUA will remain  in effect (meaning this test can be used) for the duration of the  Covid-19 declaration under Section 564(b)(1) of the Act, 21  U.S.C. section 360bbb-3(b)(1), unless the authorization is  terminated or revoked. Performed at [facility], [address]  [place], [place] [address]   Aerobic/Anaerobic Culture (surgical/deep wound)     Status: None   Collection Time: 11/11/19  4:44 PM   Specimen: PATH Amputaion Arm/Leg; Tissue  Result Value Ref Range Status   Specimen Description   Final    WOUND Performed at [facility], [address], [place], [place] [address]    Special Requests   Final    NONE Performed at [facility], [address] [place], [place] [address]    Gram Stain   Final    FEW WBC PRESENT, PREDOMINANTLY PMN FEW GRAM POSITIVE COCCI IN CLUSTERS RARE GRAM NEGATIVE RODS Performed at [facility], [place] [address], [place], [place] [address]    Culture   Final    FEW METHICILLIN RESISTANT STAPHYLOCOCCUS AUREUS MODERATE ENTEROCOCCUS FAECALIS NO ANAEROBES ISOLATED CORRECTED RESULTS PREVIOUSLY REPORTED AS: STAPHYLOCOCCUS SPECIES (COAGULASE NEGATIVE) CORRECTED RESULTS CALLED TO: A. RICHEY RN, AT [id number] 11/17/19 BY D. VANHOOK MULTIPLE ORGANISMS PRESENT, NONE PREDOMINANT    Report Status 11/18/2019 FINAL  Final   Organism ID, Bacteria METHICILLIN RESISTANT STAPHYLOCOCCUS AUREUS  Final   Organism ID, Bacteria ENTEROCOCCUS FAECALIS  Final      Susceptibility   Enterococcus faecalis - MIC*    AMPICILLIN <=2 SENSITIVE Sensitive     VANCOMYCIN 2 SENSITIVE Sensitive     GENTAMICIN SYNERGY SENSITIVE Sensitive     * MODERATE ENTEROCOCCUS FAECALIS   Methicillin resistant staphylococcus aureus - MIC*    CIPROFLOXACIN >=8 RESISTANT Resistant     ERYTHROMYCIN >=8 RESISTANT Resistant     GENTAMICIN <=0.5 SENSITIVE Sensitive     OXACILLIN >=4 RESISTANT Resistant     TETRACYCLINE <=1 SENSITIVE Sensitive     VANCOMYCIN <=0.5 SENSITIVE Sensitive     TRIMETH/SULFA <=10 SENSITIVE Sensitive     CLINDAMYCIN <=0.25 SENSITIVE Sensitive     RIFAMPIN <=0.5 SENSITIVE Sensitive     Inducible Clindamycin NEGATIVE Sensitive     * FEW METHICILLIN RESISTANT STAPHYLOCOCCUS AUREUS  MRSA PCR Screening     Status: Abnormal    Collection Time: 11/20/19  9:03 AM   Specimen: Nasal Mucosa; Nasopharyngeal  Result Value Ref Range Status   MRSA by PCR POSITIVE (A) NEGATIVE Final    Comment:        The GeneXpert MRSA Assay (FDA approved for NASAL specimens only), is one component of a comprehensive MRSA colonization surveillance program. It is not intended to diagnose MRSA infection nor to guide or monitor treatment for MRSA infections. RESULT CALLED TO, READ BACK BY AND VERIFIED WITH: [facility]EN[facility] RN AT [id number] ON 11/20/19 [facility]  Performed at [facility], [address], [place], [place] [address]          Radiology Studies: No results found.    Scheduled Meds: . acidophilus  1 capsule Oral Daily  . amoxicillin-clavulanate  1 tablet Oral Q12H  . apixaban  5 mg Oral BID  . vitamin C  500 mg Oral Daily  . Chlorhexidine  Gluconate Cloth  6 each Topical [id number]  . cholecalciferol  5,000 Units Oral Daily  . docusate sodium  100 mg Oral Daily  . ferrous sulfate  325 mg Oral BID WC  . gabapentin  1,200 mg Oral TID  . insulin aspart  0-15 Units Subcutaneous TID PC & HS  . linezolid  600 mg Oral Q12H  . metoprolol tartrate  50 mg Oral BID  . multivitamin with minerals  1 tablet Oral Daily  . mupirocin ointment  1 application Nasal BID  . nutrition supplement (JUVEN)  1 packet Oral BID WC  . pantoprazole  20 mg Oral Daily  . Ensure Max Protein  11 oz Oral BID BM   Continuous Infusions: . sodium chloride 1,000 mL (11/17/19 0230)     LOS: 9 days      Debbe Odea, MD [facility] Pager: [email or web address] 11/20/2019, 11:28 AM

## 2019-11-20 NOTE — Discharge Instructions (Signed)
Vascular Surgery Discharge Instructions: VAC changes Monday-Wednesday-Friday. Settings: 125, continuous.  Encourage frequent repositioning and offloading of pressure to right above-the-knee amputation stump wound. Encourage ambulation.

## 2019-11-20 NOTE — Consult Note (Signed)
[place] Nurse wound follow up Wound type:Right lower leg surgical site with recent debridement and NPWT (VAC) therapy. Patient is premedicated and vascular PA is present.  Measurement:15 cm x 8 cm x 4 cm  Wound [id number] red with 5% black tissue Drainage (amount, consistency, odor) moderate bloody drainage  No odor Periwound:intact Dressing procedure/placement/frequency: Cleanse wound with NS and pat dry  Apply 1 piece black foam.  Bridge with second piece of foam to dorsal thigh.  Intact skin protected with drape. Change Mon/Wed/Fri WOC team will follow.  Domenic Moras MSN, RN, FNP-BC CWON Wound, Ostomy, Continence Nurse Pager [phone]

## 2019-11-20 NOTE — TOC Transition Note (Signed)
Transition of Care [facility]) - CM/SW Discharge Note   Patient Details  Name: [name]. MRN: [id number] Date of Birth: Sep 20, 1955  [facility] of [facility]) CM/SW Contact:  Victorino Dike, RN Phone Number: 11/20/2019, 11:48 AM   Clinical Narrative:     Per Medical Team patient should be ready for discharge Tuesday or Wednesday.  Patient has [employer], Authorization is being started today by [facility], they are also aware patient will need a wound vac at facility.  Spoke with patient and spouse to explain process for transfer.      Final next level of care: [place]     Patient Goals and CMS Choice        Discharge Placement                       Discharge Plan and Services                                     Social Determinants of Health (SDOH) Interventions     Readmission Risk Interventions No flowsheet data found.

## 2019-11-20 NOTE — Care Management Important Message (Signed)
Important Message  Patient Details  Name: [name]. MRN: [id number] Date of Birth: [birth year]   Medicare Important Message Given:  Yes  Called patient room to review over phone due to isolation status and reviewed with spouse.  Aware of right.    Dannette Barbara 11/20/2019, 1:51 PM

## 2019-11-21 ENCOUNTER — Inpatient Hospital Stay: Payer: Medicare HMO

## 2019-11-21 LAB — GLUCOSE, CAPILLARY
Glucose-Capillary: 124 mg/dL — ABNORMAL HIGH (ref 70–99)
Glucose-Capillary: 158 mg/dL — ABNORMAL HIGH (ref 70–99)
Glucose-Capillary: 137 mg/dL — ABNORMAL HIGH (ref 70–99)
Glucose-Capillary: 146 mg/dL — ABNORMAL HIGH (ref 70–99)

## 2019-11-21 LAB — CBC
HCT: 27.8 % — ABNORMAL LOW (ref 39.0–52.0)
Hemoglobin: 8.5 g/dL — ABNORMAL LOW (ref 13.0–17.0)
MCH: 25.4 pg — ABNORMAL LOW (ref 26.0–34.0)
MCHC: 30.6 g/dL (ref 30.0–36.0)
MCV: 83 fL (ref 80.0–100.0)
Platelets: 113 10*3/uL — ABNORMAL LOW (ref 150–400)
RBC: 3.35 MIL/uL — ABNORMAL LOW (ref 4.22–5.81)
RDW: 19.5 % — ABNORMAL HIGH (ref 11.5–15.5)
WBC: 8.5 10*3/uL (ref 4.0–10.5)
nRBC: 0 % (ref 0.0–0.2)

## 2019-11-21 MED ORDER — OXYCODONE HCL 5 MG PO TABS: 5.0000 mg | ORAL_TABLET | Freq: Four times a day (QID) | ORAL | 0 refills | Status: DC | PRN

## 2019-11-21 MED ORDER — HYDROMORPHONE HCL 1 MG/ML IJ SOLN: 0.5000 mg | INTRAMUSCULAR | Status: DC | PRN

## 2019-11-21 MED ORDER — IBUPROFEN 400 MG PO TABS
400.0000 mg | ORAL_TABLET | ORAL | Status: DC | PRN
  Administered 2019-11-24 – 2019-11-27 (×2): 400 mg via ORAL
  Filled 2019-11-21 (×2): qty 1

## 2019-11-21 NOTE — Progress Notes (Signed)
PROGRESS NOTE    Glenn B Saksa Jr.   [id number]  DOB: 1956/03/02  DOA: 11/10/2019 PCP: [name], DO   Brief Narrative:  Glenn Gerold. is a 64 y.o. 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 He was started on IV antibiotics.  Also noted was that he was in A-fib with RVR for which he was started on a Cardizem infusion.   Subjective: Ongoing pain in left leg.     Assessment & Plan:   Active Problems:   Sepsis with right stump infection after AKA - appreciate ID and vascular surgery assistance- - wound culture result on 4/17 > MRSA-  cont Abx per ID->  Unasyn and Zyvox - s/p I and D - cont wound vac with changes on MWF -  4/16> OK to resume Eliquis per Dr Lucky Cowboy  - nutrition consulted to add protein for wound healing -  vascular surgery recommends no further interventions and d/c to SNF with wound vac - ID recommends Linezolid and Augmentin until 4/30 - cut Dilaudid in half - add PRN Ibuprofen  Anemia, with Iron deficiency - per wife he was supposed to have his iron checked today and IV Iron transfused  - anemia panel checked- normal Ferretin- cont oral Iron and hold off on IV  Ref. Range 11/20/2019 11:17  Iron Latest Ref Range: 45 - 182 ug/dL 36 (L)  UIBC Latest Units: ug/dL 220  TIBC Latest Ref Range: 250 - 450 ug/dL 256  Saturation Ratios Latest Ref Range: 17.9 - 39.5 % 14 (L)  Ferritin Latest Ref Range: 24 - 336 ng/mL 77  Folate Latest Ref Range: >5.9 ng/mL 20.5  Vitamin B12 Latest Ref Range: 180 - 914 pg/mL 2,464 (H)   Hypokalemia - replaced  Lethargy, confusion (4/14/ 4/15)  - d/c'd Xanax which he does not take at home and he received on the evening of the 13th - CT head  ordered by vascular-neg for new CVA  PAD - s/p  angioplasty and multiple stents  Cirrhosis - per wife he has a h/o ETOH abuse but he has stopped drinking completely - only trace ascites on MRI  Hep B surface Ag + and E Ab + - no symptoms of hepatitis- ID recommended obtained Hep B DNR which I ordered on 4/15- it is in process - I spoke with Dr Vicente Males who recommended outpt follow up (the patient's wife states he is his hepatologist)  Multiple liver lesions - cont outpatient surveillance of these with serial MRI  - AFP < 0.9  Permanent A-Fib - on Metoprolol - HR rapid - increased to 50 BID- HR better controlled- BP has remained stable - Eliquis was on hold as plt quite low (31) - resumed on 4/16  Thrombocytopenia - possibly related to cirrhosis with superimposed sepsis - platelets 31 > 60 > 72> 116 - follow platelets on Zyvox- 113 today  DM2 - on Jardiance as outpt- A1C IS 7.7 - cont SSI- sugars very well controlled  Edema of left leg- chronic - I ordered TEDS but he declines them.  Time spent in minutes: 35 DVT prophylaxis: holding Code Status: Full code Family Communication: wife  Disposition Plan:  From home- medically ready to d/c- Awaiting insurance auth for SNF- may take 3 days  per CM - TOC consulted on 4/15 Consultants:   ID  Vascular surgery Procedures:   I and D Antimicrobials:  Anti-infectives (From admission, onward)   Start     Dose/Rate Route Frequency Ordered Stop   11/20/19 1200  amoxicillin-clavulanate (AUGMENTIN) 875-125 MG per tablet 1 tablet     1 tablet Oral Every 12 hours 11/20/19 1020     11/17/19 1800  linezolid (ZYVOX) tablet 600 mg     600 mg Oral Every 12 hours 11/17/19 1406     11/16/19 1800  ampicillin-sulbactam (UNASYN) 1.5 g in sodium chloride 0.9 % 100 mL IVPB  Status:  Discontinued     1.5 g 200 mL/hr over 30 Minutes Intravenous Every 6 hours 11/16/19 1418 11/20/19 0929   11/13/19 2200  cefTRIAXone (ROCEPHIN) 2 g in sodium chloride 0.9 % 100 mL IVPB  Status:  Discontinued     2  g 200 mL/hr over 30 Minutes Intravenous Every 24 hours 11/13/19 2007 11/16/19 1418   11/13/19 2200  metroNIDAZOLE (FLAGYL) IVPB 500 mg  Status:  Discontinued     500 mg 100 mL/hr over 60 Minutes Intravenous Every 8 hours 11/13/19 2007 11/16/19 1418   11/11/19 1400  vancomycin (VANCOREADY) IVPB 1250 mg/250 mL  Status:  Discontinued     1,250 mg 166.7 mL/hr over 90 Minutes Intravenous Every 12 hours 11/11/19 0158 11/17/19 1406   11/11/19 0600  piperacillin-tazobactam (ZOSYN) IVPB 3.375 g  Status:  Discontinued     3.375 g 12.5 mL/hr over 240 Minutes Intravenous Every 8 hours 11/11/19 0322 11/13/19 2007   11/11/19 0215  vancomycin (VANCOCIN) IVPB 1000 mg/200 mL premix  Status:  Discontinued     1,000 mg 200 mL/hr over 60 Minutes Intravenous  Once 11/11/19 0214 11/11/19 0321   11/11/19 0215  piperacillin-tazobactam (ZOSYN) IVPB 3.375 g  Status:  Discontinued     3.375 g 100 mL/hr over 30 Minutes Intravenous Every 6 hours 11/11/19 0214 11/11/19 0321   11/11/19 0015  vancomycin (VANCOCIN) IVPB 1000 mg/200 mL premix     1,000 mg 200 mL/hr over 60 Minutes Intravenous  Once 11/11/19 0010 11/11/19 0252   11/10/19 2345  vancomycin (VANCOCIN) IVPB 1000 mg/200 mL premix     1,000 mg 200 mL/hr over 60 Minutes Intravenous  Once 11/10/19 2342 11/11/19 0127   11/10/19 2345  cefTRIAXone (ROCEPHIN) 2 g in sodium chloride 0.9 % 100 mL IVPB     2 g 200 mL/hr over 30 Minutes Intravenous  Once 11/10/19 2342 11/11/19 0131       Objective: Vitals:   11/20/19 2006 11/21/19 0543 11/21/19 0735 11/21/19 1131  BP: 132/85 116/79 112/74 98/63  Pulse: 84 84 84 84  Resp: 18 18 17 18   Temp: 98.3 F (36.8 C) 98.7 F (37.1 C) 98.3 F (36.8 C) 98.6 F (37 C)  TempSrc: Oral Oral Oral Oral  SpO2: 99% 98% 98% 100%  Weight:  96.8 kg    Height:        Intake/Output Summary (Last 24 hours) at 11/21/2019 1325 Last data filed at 11/21/2019 0847 Gross per 24 hour  Intake 10 ml  Output 50 ml  Net -40 ml   Filed  Weights   11/19/19 0417 11/20/19 0129 11/21/19 0543  Weight: 93 kg 93.4 kg 96.8 kg    Examination: General exam: Appears comfortable  HEENT: PERRLA, oral mucosa moist, no sclera icterus or thrush Respiratory system: Clear to auscultation. Respiratory effort normal. Cardiovascular system: S1 & S2 heard,  No  murmurs  Gastrointestinal system: Abdomen soft, non-tender, nondistended. Normal bowel sounds   Central nervous system: Alert and oriented. No focal neurological deficits. Extremities: No cyanosis, clubbing-  Edema of left leg with pain to touch- wound vac on right stump Skin: No rashes or ulcers Psychiatry:  Mood & affect appropriate.     Data Reviewed: I have personally reviewed following labs and imaging studies  CBC: Recent Labs  Lab 11/16/19 0932 11/17/19 0605 11/19/19 0337 11/19/19 1238 11/21/19 0608  WBC 10.9* 10.6* 9.3 10.7* 8.5  HGB 9.2* 8.3* 8.4* 9.1* 8.5*  HCT 30.2* 27.5* 28.6* 31.0* 27.8*  MCV 83.2 84.6 85.6 86.6 83.0  PLT 60* 72* 116* 120* [id number]*   Basic Metabolic Panel: Recent Labs  Lab 11/15/19 0623 11/16/19 0932 11/17/19 0605 11/18/19 0941 11/19/19 0337  NA 137 136 139 134* 135  K 3.8 3.0* 3.1* 3.8 3.9  CL 101 98 101 100 101  CO2 26 29 28 27 28   GLUCOSE 166* 204* 142* 169* 140*  BUN 15 13 12 13 12   CREATININE 0.58* 0.53* 0.57* 0.54* 0.46*  CALCIUM 8.1* 8.4* 8.2* 8.2* 8.5*  MG  --   --  1.7  --   --    GFR: Estimated Creatinine Clearance: 105.6 mL/min (A) (by C-G formula based on SCr of 0.46 mg/dL (L)). Liver Function Tests: Recent Labs  Lab 11/15/19 0623  AST 63*  ALT 94*  ALKPHOS 160*  BILITOT 2.2*  PROT 5.8*  ALBUMIN 2.2*   No results for input(s): LIPASE, AMYLASE in the last 168 hours. No results for input(s): AMMONIA in the last 168 hours. Coagulation Profile: No results for input(s): INR, PROTIME in the last 168 hours. Cardiac Enzymes: No results for input(s): CKTOTAL, CKMB, CKMBINDEX, TROPONINI in the last 168 hours. BNP  (last 3 results) No results for input(s): PROBNP in the last 8760 hours. HbA1C: No results for input(s): HGBA1C in the last 72 hours. CBG: Recent Labs  Lab 11/20/19 1128 11/20/19 1631 11/20/19 2108 11/21/19 0737 11/21/19 1132  GLUCAP 150* 152* 135* 124* 158*   Lipid Profile: No results for input(s): CHOL, HDL, LDLCALC, TRIG, CHOLHDL, LDLDIRECT in the last 72 hours. Thyroid Function Tests: No results for input(s): TSH, T4TOTAL, FREET4, T3FREE, THYROIDAB in the last 72 hours. Anemia Panel: Recent Labs    11/20/19 1117  VITAMINB12 2,464*  FOLATE 20.5  FERRITIN 77  TIBC 256  IRON 36*  RETICCTPCT 5.1*   Urine analysis:    Component Value Date/Time   COLORURINE AMBER (A) 11/10/2019 2357   APPEARANCEUR HAZY (A) 11/10/2019 2357   APPEARANCEUR Clear 07/03/2019 1513   LABSPEC 1.022 11/10/2019 2357   PHURINE 5.0 11/10/2019 2357   GLUCOSEU >=500 (A) 11/10/2019 2357   HGBUR SMALL (A) 11/10/2019 2357   BILIRUBINUR NEGATIVE 11/10/2019 2357   BILIRUBINUR Negative 07/03/2019 1513   KETONESUR 20 (A) 11/10/2019 2357   PROTEINUR 30 (A) 11/10/2019 2357   NITRITE NEGATIVE 11/10/2019 2357   LEUKOCYTESUR NEGATIVE 11/10/2019 2357   Sepsis Labs: @LABRCNTIP (procalcitonin:4,lacticidven:4) ) Recent Results (from the past 240 hour(s))  Aerobic/Anaerobic Culture (surgical/deep wound)     Status: None   Collection Time: 11/11/19  4:44 PM   Specimen: PATH Amputaion Arm/Leg; Tissue  Result Value Ref Range Status   Specimen Description   Final    WOUND Performed at [facility], [address], [place], [place] [address]    Special Requests   Final    NONE Performed at [facility], [address] [place],  [place] [address]    Gram Stain   Final    FEW WBC PRESENT, PREDOMINANTLY PMN FEW GRAM POSITIVE COCCI IN CLUSTERS RARE GRAM NEGATIVE RODS Performed at [facility], [place] [address] [place], [place] [address]    Culture   Final    FEW METHICILLIN  RESISTANT STAPHYLOCOCCUS AUREUS MODERATE ENTEROCOCCUS FAECALIS NO ANAEROBES ISOLATED CORRECTED RESULTS PREVIOUSLY REPORTED AS: STAPHYLOCOCCUS SPECIES (COAGULASE NEGATIVE) CORRECTED RESULTS CALLED TO: A. RICHEY RN, AT [id number] 11/17/19 BY D. VANHOOK MULTIPLE ORGANISMS PRESENT, NONE PREDOMINANT    Report Status 11/18/2019 FINAL  Final   Organism ID, Bacteria METHICILLIN RESISTANT STAPHYLOCOCCUS AUREUS  Final   Organism ID, Bacteria ENTEROCOCCUS FAECALIS  Final      Susceptibility   Enterococcus faecalis - MIC*    AMPICILLIN <=2 SENSITIVE Sensitive     VANCOMYCIN 2 SENSITIVE Sensitive     GENTAMICIN SYNERGY SENSITIVE Sensitive     * MODERATE ENTEROCOCCUS FAECALIS   Methicillin resistant staphylococcus aureus - MIC*    CIPROFLOXACIN >=8 RESISTANT Resistant     ERYTHROMYCIN >=8 RESISTANT Resistant     GENTAMICIN <=0.5 SENSITIVE Sensitive     OXACILLIN >=4 RESISTANT Resistant     TETRACYCLINE <=1 SENSITIVE Sensitive     VANCOMYCIN <=0.5 SENSITIVE Sensitive     TRIMETH/SULFA <=10 SENSITIVE Sensitive     CLINDAMYCIN <=0.25 SENSITIVE Sensitive     RIFAMPIN <=0.5 SENSITIVE Sensitive     Inducible Clindamycin NEGATIVE Sensitive     * FEW METHICILLIN RESISTANT STAPHYLOCOCCUS AUREUS  MRSA PCR Screening     Status: Abnormal   Collection Time: 11/20/19  9:03 AM   Specimen: Nasal Mucosa; Nasopharyngeal  Result Value Ref Range Status   MRSA by PCR POSITIVE (A) NEGATIVE Final    Comment:        The GeneXpert MRSA Assay (FDA approved for NASAL specimens only), is one component of a comprehensive MRSA colonization surveillance program. It is not intended to diagnose MRSA infection nor to guide or monitor treatment for MRSA infections. RESULT CALLED TO, READ BACK BY AND VERIFIED WITH: [facility] RN AT [id number] ON 11/20/19 SNG  Performed at [facility], [address], [place], [place] [address]   Respiratory Panel by RT PCR (Flu A&B, Covid) - Nasopharyngeal Swab     Status: None    Collection Time: 11/20/19 11:46 AM   Specimen: Nasopharyngeal Swab  Result Value Ref Range Status   SARS Coronavirus 2 by RT PCR NEGATIVE NEGATIVE Final    Comment: (NOTE) SARS-CoV-2 target nucleic acids are NOT DETECTED. The SARS-CoV-2 RNA is generally detectable in upper respiratoy specimens during the acute phase of infection. The lowest concentration of SARS-CoV-2 viral copies this assay can detect is 131 copies/mL. A negative result does not preclude SARS-Cov-2 infection and should not be used as the sole basis for treatment or other patient management decisions. A negative result may occur with  improper specimen collection/handling, submission of specimen other than nasopharyngeal swab, presence of viral mutation(s) within the areas targeted by this assay, and inadequate number of viral copies (<131 copies/mL). A negative result must be combined with clinical observations, patient history, and epidemiological information. The expected result is Negative. Fact Sheet for Patients:  PinkCheek.be Fact Sheet for Healthcare Providers:  GravelBags.it This test is not yet ap proved or cleared by the [place] FDA and  has been authorized for detection and/or diagnosis of SARS-CoV-2 by FDA under an Emergency Use Authorization (EUA). This EUA will remain  in effect (meaning  this test can be used) for the duration of the COVID-19 declaration under Section 564(b)(1) of the Act, 21 U.S.C. section 360bbb-3(b)(1), unless the authorization is terminated or revoked sooner.    Influenza A by PCR NEGATIVE NEGATIVE Final   Influenza B by PCR NEGATIVE NEGATIVE Final    Comment: (NOTE) The Xpert Xpress SARS-CoV-2/FLU/RSV assay is intended as an aid in  the diagnosis of influenza from Nasopharyngeal swab specimens and  should not be used as a sole basis for treatment. Nasal washings and  aspirates are unacceptable for Xpert Xpress  SARS-CoV-2/FLU/RSV  testing. Fact Sheet for Patients: PinkCheek.be Fact Sheet for Healthcare Providers: GravelBags.it This test is not yet approved or cleared by the [place] FDA and  has been authorized for detection and/or diagnosis of SARS-CoV-2 by  FDA under an Emergency Use Authorization (EUA). This EUA will remain  in effect (meaning this test can be used) for the duration of the  Covid-19 declaration under Section 564(b)(1) of the Act, 21  U.S.C. section 360bbb-3(b)(1), unless the authorization is  terminated or revoked. Performed at [facility], [address], [place], [place] [address]          Radiology Studies: No results found.    Scheduled Meds: . acidophilus  1 capsule Oral Daily  . amoxicillin-clavulanate  1 tablet Oral Q12H  . apixaban  5 mg Oral BID  . vitamin C  500 mg Oral Daily  . Chlorhexidine Gluconate Cloth  6 each Topical Q0600  . cholecalciferol  5,000 Units Oral Daily  . docusate sodium  100 mg Oral Daily  . ferrous sulfate  325 mg Oral BID WC  . gabapentin  1,200 mg Oral TID  . insulin aspart  0-15 Units Subcutaneous TID PC & HS  . linezolid  600 mg Oral Q12H  . metoprolol tartrate  50 mg Oral BID  . multivitamin with minerals  1 tablet Oral Daily  . mupirocin ointment  1 application Nasal BID  . nutrition supplement (JUVEN)  1 packet Oral BID WC  . pantoprazole  20 mg Oral Daily  . Ensure Max Protein  11 oz Oral BID BM  . vitamin B-6  50 mg Oral Daily  . sodium chloride flush  10 mL Intravenous Q12H   Continuous Infusions: . sodium chloride 1,000 mL (11/17/19 0230)     LOS: 10 days      Debbe Odea, MD [facility] Pager: [email or web address] 11/21/2019, 1:25 PM

## 2019-11-21 NOTE — Progress Notes (Signed)
Physical Therapy Treatment Patient Details Name: [name]. MRN: [id number] DOB: 1955-09-25 Today's Date: 11/21/2019    History of Present Illness Pt admitted for sepsis secondary to wound infection and cellulitis. Pt is now s/p I&D of R residual limb on 11/12/19. 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 Other PMH includes expressive aphasia s/p L PCA CVA, Afib, and GERD.    PT Comments    PT/OT co-treat 2/2 to complexity of case and requiring +2 assistance for safety with functional mobility. Pt was long sitting in bed upon arriving. He agrees to PT session however was limited by pain and cognition. Per spouse," he was much more oriented this morning however still wasn't at baseline." Pt yells out throughout session in pain, even when therapist are not touching him.Unable to state where pain is located. He required +2 assistance to sit on EOB with vcs and tactile cues throughout. Pt required various amounts of assistance to maintain sitting balance. Continues to have L lateral lean but did correct for several minutes.He was unwilling to trial standing. He did however demonstrate ability/strength to lateral shift hips along EOB to [facility] prior to returning to supine. Strength is not limiting pt, however cognition and pain does. He was repositioned in bed at conclusion of session with spouse present and pt resting comfortably. Therapist discussed with RN cognition limitations and pain limitation. RN will reach out to therapist next date if cognition clears like earlier this date. PT/OT recommend DC to SNF to address all functional mobility deficits. Acute PT will continue to follow per POC.     Follow Up Recommendations  SNF     Equipment Recommendations  None recommended by PT    Recommendations for Other Services       Precautions / Restrictions Precautions Precautions: Fall Restrictions Weight Bearing Restrictions: No RLE Weight Bearing: Non  weight bearing    Mobility  Bed Mobility Overal bed mobility: Needs Assistance Bed Mobility: Supine to Sit;Sit to Supine     Supine to sit: +2 for safety/equipment;+2 for physical assistance;HOB elevated;Max assist;Total assist Sit to supine: Max assist;Total assist;+2 for physical assistance;+2 for safety/equipment;HOB elevated   General bed mobility comments: Pt was able to transition from supine to/from short sit EOB with +2 assistance for safety. pt yells out in pain but once seated EOB was able to maintaining sitting x ~12 minutes with max encouragement for further parrticipation in activity. pt unwilling. He does demonstarte ability to maintain balance seated EOb but with max vcs and tactle cueing.   Transfers Overall transfer level: Needs assistance   Transfers: Lateral/Scoot Transfers          Lateral/Scoot Transfers: Min guard General transfer comment: Once pt wanted to lay back down, he demonstrated ability to laterally scoop from FOB to [facility] without assistance. INcreased time to perform with encouragement throughout. he demonstrated ability to clear hips off EOB with pushing through BUEs and LLE. Pain/ cognition continues to limit pt progression with PT  Ambulation/Gait                 Stairs             Wheelchair Mobility    Modified Rankin (Stroke Patients Only)       Balance Overall balance assessment: Needs assistance Sitting-balance support: Feet supported;Bilateral upper extremity supported Sitting balance-Leahy Scale: Fair Sitting balance - Comments: various amount of assistance required 2/2 to pain and pt inconsistently following therapist desired request.  pt gets frustrated and yells out many times to "stop" " it hurts". At times pt was able to maintain sitting balance with close SBA however at times also required mod-max assist 2/2 to severe L lateral lean. PT will continue to progress as able per POC.                                     Cognition Arousal/Alertness: Awake/alert Behavior During Therapy: Agitated;Restless Overall Cognitive Status: Impaired/Different from baseline Area of Impairment: Orientation;Following commands;Safety/judgement                 Orientation Level: Disoriented to;Place;Situation;Time     Following Commands: Follows one step commands inconsistently Safety/Judgement: Decreased awareness of safety;Decreased awareness of deficits     General Comments: Per spouse/pt's wife; He was alot more oriented this morning but is back to being confused this afternoon. Therapist discussed with RN and she also reports pt had a few hours of clarity earlier however pt is disoriented at this time. he is oriented to self only. Yells out in pain several times throughout session even with minimal-no contact.      Exercises      General Comments        Pertinent Vitals/Pain Pain Assessment: 0-10 Pain Score: 10-Worst pain ever Faces Pain Scale: Hurts whole lot Pain Location: "all over" Pain Descriptors / Indicators: Moaning;Grimacing;Guarding;Restless Pain Intervention(s): Limited activity within patient's tolerance;Monitored during session;Premedicated before session;Repositioned    Home Living                      Prior Function            PT Goals (current goals can now be found in the care plan section) Acute Rehab PT Goals Patient Stated Goal: no personal goals stated Progress towards PT goals: Not progressing toward goals - comment(pain/cognition limiting progression)    Frequency    Min 2X/week      PT Plan Current plan remains appropriate    Co-evaluation   Reason for Co-Treatment: Complexity of the patient's impairments (multi-system involvement);Necessary to address cognition/behavior during functional activity;For patient/therapist safety;To address functional/ADL transfers PT goals addressed during session: Mobility/safety with mobility;Proper use of  DME;Balance;Strengthening/ROM        AM-PAC PT "6 Clicks" Mobility   Outcome Measure  Help needed turning from your back to your side while in a flat bed without using bedrails?: A Lot Help needed moving from lying on your back to sitting on the side of a flat bed without using bedrails?: A Lot Help needed moving to and from a bed to a chair (including a wheelchair)?: A Lot Help needed standing up from a chair using your arms (e.g., wheelchair or bedside chair)?: Total Help needed to walk in hospital room?: Total Help needed climbing 3-5 steps with a railing? : Total 6 Click Score: 9    End of Session   Activity Tolerance: Patient limited by pain Patient left: in bed;with call bell/phone within reach;with bed alarm set;with family/visitor present Nurse Communication: Mobility status PT Visit Diagnosis: Difficulty in walking, not elsewhere classified (R26.2);Other abnormalities of gait and mobility (R26.89);Muscle weakness (generalized) (M62.81)     Time: [id number] PT Time Calculation (min) (ACUTE ONLY): 33 min  Charges:  $Therapeutic Activity: 8-22 mins                     Julaine Fusi  PTA 11/21/19, 3:49 PM

## 2019-11-21 NOTE — Plan of Care (Signed)
  Problem: Pain Managment: Goal: General experience of comfort will improve Outcome: Not Progressing Note: Patient's states he is in pain at all times. Patient is unable to describe or tell staff where pain is occurring. Patient is confused and begins yelling and cursing prior to be repositioned or touched by staff. Patient has removed telemetry box and gown repeatedly this shift.

## 2019-11-21 NOTE — Progress Notes (Signed)
Occupational Therapy Treatment Patient Details Name: [name]. MRN: [id number] DOB: 02-17-1956 Today's Date: 11/21/2019    History of present illness Pt admitted for sepsis secondary to wound infection and cellulitis. Pt is now s/p I&D of R residual limb on 11/12/19. 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 Other PMH includes expressive aphasia s/p L PCA CVA, Afib, and GERD.   OT comments  Pt seen today for OT treatment/PT co-treatment d/t low fxl activity tolerance secondary to pain. Pt requires calm encouragement/re-direction throughout. Pt requires MAX A +2 for for bed mobility this date secondary to pain/poor sequencing 2/2 cognitive status. Pt's spouse reports that he was more lucid this AM. Pt tolerates EOB static sitting x~12 minutes with MIN A to CGA support, demonstrating L lateral lean as if attempting to offload pressure to R LE residual limb. Pt with poor fxl activity tolerance and poor participate during this session despite encouragement and attempts to engage pt in various self care tasks/therapeutic activities/exercises. Pt demos good lateral scooting to his left while seated EOB with no assistance in prep for sit to sup transition. Anticipate SNF remains most prudent d/c recommendation at this time.   Follow Up Recommendations  SNF;Supervision/Assistance - 24 hour    Equipment Recommendations  3 in 1 bedside commode    Recommendations for Other Services      Precautions / Restrictions Precautions Precautions: Fall Restrictions Weight Bearing Restrictions: No RLE Weight Bearing: Non weight bearing       Mobility Bed Mobility Overal bed mobility: Needs Assistance Bed Mobility: Supine to Sit;Sit to Supine     Supine to sit: +2 for safety/equipment;+2 for physical assistance;HOB elevated;Max assist;Total assist Sit to supine: Max assist;Total assist;+2 for physical assistance;+2 for safety/equipment;HOB elevated   General  bed mobility comments: Pt yells out in pain, but able to maintain static sitting balance at EOB x12 mins while OT/PT attempt to encourage pt to participate in any level of therapeutic activity/self care task. Pt unwilling despite being given several options. Very poor ability to attend and consistently states "NO!" despite task/activity being offered/encouraged. Pt demos great seated lateral scooting at bed side towards HOB before sit to sup transition back into bed requiring only CGA.  Transfers                 General transfer comment: deferrred this date, pt unwilling to participate despite demonstrating good L LE and B UE strength necessary to complete stand with assistance. Pt's pain/cognition limit participation.    Balance Overall balance assessment: Needs assistance Sitting-balance support: Feet supported;Bilateral upper extremity supported Sitting balance-Leahy Scale: Fair                                     ADL either performed or assessed with clinical judgement   ADL                                         General ADL Comments: unable to engage pt in seated ADL at this time d/t poor cognition. Attempt to engage pt in threading sock to L LE, but pt unwilling to attempt participation even with assitance.     Vision Baseline Vision/History: Wears glasses Wears Glasses: At all times Patient Visual Report: No change from baseline  Perception     Praxis      Cognition Arousal/Alertness: Awake/alert Behavior During Therapy: Agitated;Restless Overall Cognitive Status: Impaired/Different from baseline Area of Impairment: Orientation;Following commands;Safety/judgement                 Orientation Level: Disoriented to;Place;Situation;Time     Following Commands: Follows one step commands inconsistently Safety/Judgement: Decreased awareness of safety;Decreased awareness of deficits     General Comments: Pt's spouse in room  during treatment session and reports pt was more alert this AM and that today is best day pt has had cognitively during this hospitalization. Pt has had some pain medication that she reports she feels decrease his mental acuity.        Exercises     Shoulder Instructions       General Comments      Pertinent Vitals/ Pain       Pain Assessment: Faces Faces Pain Scale: Hurts whole lot Pain Location: "all over"(pt moans/fusses with any tactile stimuli) Pain Descriptors / Indicators: Moaning;Grimacing;Guarding;Restless Pain Intervention(s): Limited activity within patient's tolerance;Monitored during session;Premedicated before session;Repositioned  Home Living           Entrance Stairs-Number of Steps: 4                              Prior Functioning/Environment              Frequency  Min 2X/week        Progress Toward Goals  OT Goals(current goals can now be found in the care plan section)  Progress towards OT goals: OT to reassess next treatment  Acute Rehab OT Goals Patient Stated Goal: Pt does eventually endorse that he would like to walk again when asked directly and spouse supports this goal OT Goal Formulation: With patient/family Time For Goal Achievement: 11/29/19 Potential to Achieve Goals: Good  Plan      Co-evaluation    PT/OT/SLP Co-Evaluation/Treatment: Yes Reason for Co-Treatment: Complexity of the patient's impairments (multi-system involvement);Necessary to address cognition/behavior during functional activity;For patient/therapist safety;To address functional/ADL transfers PT goals addressed during session: Mobility/safety with mobility;Balance;Proper use of DME;Strengthening/ROM OT goals addressed during session: ADL's and self-care;Proper use of Adaptive equipment and DME      AM-PAC OT "6 Clicks" Daily Activity     Outcome Measure   Help from another person eating meals?: A Little Help from another person taking care of  personal grooming?: A Little Help from another person toileting, which includes using toliet, bedpan, or urinal?: Total Help from another person bathing (including washing, rinsing, drying)?: A Lot Help from another person to put on and taking off regular upper body clothing?: A Lot Help from another person to put on and taking off regular lower body clothing?: Total 6 Click Score: 12    End of Session    OT Visit Diagnosis: Other abnormalities of gait and mobility (R26.89);Muscle weakness (generalized) (M62.81)   Activity Tolerance Treatment limited secondary to agitation;Patient limited by pain   Patient Left in bed;with call bell/phone within reach;with bed alarm set;with family/visitor present   Nurse Communication          Time: [id number] OT Time Calculation (min): 28 min  Charges: OT General Charges $OT Visit: 1 Visit OT Treatments $Therapeutic Activity: 8-22 mins  Gerrianne Scale, [place], OTR/L ascom [phone] 11/21/19, 6:08 PM

## 2019-11-22 DIAGNOSIS — L02415 Cutaneous abscess of right lower limb: Secondary | ICD-10-CM

## 2019-11-22 DIAGNOSIS — A419 Sepsis, unspecified organism: Secondary | ICD-10-CM | POA: Diagnosis not present

## 2019-11-22 LAB — GLUCOSE, CAPILLARY
Glucose-Capillary: 113 mg/dL — ABNORMAL HIGH (ref 70–99)
Glucose-Capillary: 161 mg/dL — ABNORMAL HIGH (ref 70–99)
Glucose-Capillary: 186 mg/dL — ABNORMAL HIGH (ref 70–99)
Glucose-Capillary: 142 mg/dL — ABNORMAL HIGH (ref 70–99)

## 2019-11-22 MED ORDER — QUETIAPINE FUMARATE 25 MG PO TABS
25.0000 mg | ORAL_TABLET | Freq: Every evening | ORAL | Status: DC | PRN
  Administered 2019-11-22 – 2019-11-25 (×5): 25 mg via ORAL
  Filled 2019-11-22 (×5): qty 1

## 2019-11-22 MED ORDER — NYSTATIN 100000 UNIT/GM EX POWD
Freq: Three times a day (TID) | CUTANEOUS | Status: DC
  Filled 2019-11-22 (×3): qty 15

## 2019-11-22 NOTE — TOC Progression Note (Signed)
Transition of Care (TOC) - Progression Note    Patient Details  Name: [name]. MRN: [id number] Date of Birth: 08/30/1955  [facility] CM/SW Contact  Su Hilt, RN Phone Number: 11/22/2019, 2:07 PM  Clinical Narrative:    Hilda Blades with [facility] called and stated they have not received insurance auth yet, will follow up and keep me posted        Expected Discharge Plan and Services                                                 Social Determinants of Health (SDOH) Interventions    Readmission Risk Interventions No flowsheet data found.

## 2019-11-22 NOTE — Progress Notes (Signed)
Physical Therapy Treatment Patient Details Name: [name]. MRN: [id number] DOB: June 05, 1956 Today's Date: 11/22/2019    History of Present Illness Pt admitted for sepsis secondary to wound infection and cellulitis. Pt is now s/p I&D of R residual limb on 11/12/19. 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 Other PMH includes expressive aphasia s/p L PCA CVA, Afib, and GERD.    PT Comments    Co-tx with OT  - one unit billed per discipline per protocols.  X 5 SLR on RLE with encouragement.  To EOB with mod/max a x 2 pt resisting at times and used ped pad to pull pt around to side.  Once sitting, he continues with heavy lean to L side.  Encouragement to sit upright and lean to right (by grabbing end of bed)  but he resists.  Assist levels vary in sitting needed increased assist initially and as he fatigues.  He does some seated ex but it is limited.  Encouraged him to lateral scoot up in bed but de does not initiate despite cues.  Returned to supine with mod/max a x 2.  He is able to pull up on headboard to assist in repositioning in bed.  Pain and cognition remain barriers for session.   Follow Up Recommendations  SNF     Equipment Recommendations  None recommended by PT    Recommendations for Other Services       Precautions / Restrictions Precautions Precautions: Fall Restrictions Weight Bearing Restrictions: No RLE Weight Bearing: Non weight bearing    Mobility  Bed Mobility Overal bed mobility: Needs Assistance Bed Mobility: Supine to Sit;Sit to Supine     Supine to sit: +2 for safety/equipment;+2 for physical assistance;HOB elevated;Max assist Sit to supine: Max assist;Total assist;+2 for physical assistance;+2 for safety/equipment;HOB elevated      Transfers Overall transfer level: Needs assistance   Transfers: Lateral/Scoot Transfers          Lateral/Scoot Transfers: Total assist;+2 physical assistance General transfer  comment: uanble to lateral scoot today despite cues and encouragement  Ambulation/Gait                 Stairs             Wheelchair Mobility    Modified Rankin (Stroke Patients Only)       Balance Overall balance assessment: Needs assistance Sitting-balance support: Feet supported;Bilateral upper extremity supported Sitting balance-Leahy Scale: Poor Sitting balance - Comments: constant tactile cues and verbal to remain upright.  increased assist initially and as he fatigues. Postural control: Left lateral lean Standing balance support: Bilateral upper extremity supported Standing balance-Leahy Scale: Poor Standing balance comment: NT                            Cognition Arousal/Alertness: Awake/alert Behavior During Therapy: Agitated;Restless Overall Cognitive Status: Impaired/Different from baseline                                        Exercises Other Exercises Other Exercises: RLE hip flex/ext in supine, sitting LAQ and ankle pumps x 10    General Comments        Pertinent Vitals/Pain Pain Assessment: Faces Faces Pain Scale: Hurts whole lot Pain Location: "all over" Pain Descriptors / Indicators: Moaning;Grimacing;Guarding;Restless Pain Intervention(s): Limited activity within patient's tolerance;Monitored during session;Repositioned  Home Living                      Prior Function            PT Goals (current goals can now be found in the care plan section) Progress towards PT goals: Progressing toward goals    Frequency    Min 2X/week      PT Plan Current plan remains appropriate    Co-evaluation PT/OT/SLP Co-Evaluation/Treatment: Yes Reason for Co-Treatment: Complexity of the patient's impairments (multi-system involvement);Necessary to address cognition/behavior during functional activity PT goals addressed during session: Mobility/safety with mobility;Balance;Strengthening/ROM OT goals  addressed during session: Strengthening/ROM;ADL's and self-care      AM-PAC PT "6 Clicks" Mobility   Outcome Measure  Help needed turning from your back to your side while in a flat bed without using bedrails?: A Lot Help needed moving from lying on your back to sitting on the side of a flat bed without using bedrails?: A Lot Help needed moving to and from a bed to a chair (including a wheelchair)?: Total Help needed standing up from a chair using your arms (e.g., wheelchair or bedside chair)?: Total Help needed to walk in hospital room?: Total Help needed climbing 3-5 steps with a railing? : Total 6 Click Score: 8    End of Session   Activity Tolerance: Patient limited by pain Patient left: in bed;with call bell/phone within reach;with bed alarm set;with family/visitor present Nurse Communication: Mobility status       Time: [id number] PT Time Calculation (min) (ACUTE ONLY): 26 min  Charges:  $Therapeutic Exercise: 8-22 mins                    Chesley Noon, PTA 11/22/19, 12:49 PM

## 2019-11-22 NOTE — Progress Notes (Signed)
PROGRESS NOTE    Glenn B Rashid Jr.  [id number] DOB: August 05, 1955 DOA: 11/10/2019 PCP: [name], DO (Confirm with patient/family/NH records and if not entered, this HAS to be entered at [facility] point of entry. "No PCP" if truly none.)   Brief Narrative: (Start on day 1 of progress note - keep it brief and live) 64 year old male with history of CVA, type 2 diabetes, memory loss, liver cirrhosis with history of alcohol abuse, peripheral vascular disease, who was admitted to the hospital for right above-knee amputation stump abscess.  He also was septic at time of admission. He has been seen by infectious disease, I&D was performed, the wound grew MRSA.  He has completed IV Unasyn and Zyvox, currently, taking Augmentin and Zyvox orally until April 30. Due to significant weakness, disability, patient will be placed in a nursing home rehab.  Pending insurance preauthorization.   Assessment & Plan:   Active Problems:   Sepsis ([place])  #1.  Sepsis with the right stump infection with abscess. Condition improving.  Continue oral Augmentin and Zyvox. Pending ECF approval.  2.  Iron deficient anemia. Continue oral supplement.   3.  Confusion. Patient still has some sleepiness.  But gradually improving.  Discontinued Xanax.  4.  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 Outpatient follow-up.  5.  Liver lesions. Negative AFP.  Follow as outpatient.  6.  Permanent atrial fibrillation. Continue Eliquis.  7.  Thrombocytopenia. Secondary to liver cirrhosis.  Improving.  8.  Type 2 diabetes. Continue sliding scale insulin.   DVT prophylaxis: Eliquis Code Status: Full Family Communication: Plan discussed with the patient and patient wife. Disposition Plan:  . Patient came from: Home            . Anticipated d/c place:SNF . Barriers to d/c OR conditions which need to be met to effect a safe d/c:   Consultants:   ID  Procedures: I&D Antimicrobials: Augmentin  and Zyvox.  Subjective: Patient still has some confusion and sleepiness.  He was able to sit up in the chair with physical therapy.  Appetite is still poor, but no nausea vomiting.  He was able to taking all the supplements. Patient has some loose stools, but no abdominal pain no nausea vomiting. No fever or chills.  Objective: Vitals:   11/21/19 2037 11/22/19 0039 11/22/19 0748 11/22/19 1145  BP: 120/81 119/70 132/73 138/80  Pulse: 94 96 (!) 102 (!) 101  Resp: 16 18  18   Temp: 98.5 F (36.9 C) 98.1 F (36.7 C) 97.9 F (36.6 C) 98.2 F (36.8 C)  TempSrc: Oral Oral Oral Oral  SpO2: 98% 100% 100% 100%  Weight:      Height:        Intake/Output Summary (Last 24 hours) at 11/22/2019 1354 Last data filed at 11/22/2019 0643 Gross per 24 hour  Intake --  Output 25 ml  Net -25 ml   Filed Weights   11/19/19 0417 11/20/19 0129 11/21/19 0543  Weight: 93 kg 93.4 kg 96.8 kg    Examination:  General exam: Appears calm and comfortable  Respiratory system: Clear to auscultation. Respiratory effort normal. Cardiovascular system: S1 & S2 heard, Irregular No JVD, murmurs, rubs, gallops or clicks. No pedal edema. Gastrointestinal system: Abdomen is nondistended, soft and nontender. No organomegaly or masses felt. Normal bowel sounds heard. Central nervous system: Drowsy, oriented to people and place. No focal neurological deficits. Extremities: Symmetric 5 x 5 power. Skin: No rashes, lesions or ulcers Psychiatry: Judgement  and insight appear normal. .     Data Reviewed: I have personally reviewed following labs and imaging studies  CBC: Recent Labs  Lab 11/16/19 0932 11/17/19 0605 11/19/19 0337 11/19/19 1238 11/21/19 0608  WBC 10.9* 10.6* 9.3 10.7* 8.5  HGB 9.2* 8.3* 8.4* 9.1* 8.5*  HCT 30.2* 27.5* 28.6* 31.0* 27.8*  MCV 83.2 84.6 85.6 86.6 83.0  PLT 60* 72* 116* 120* 950*   Basic Metabolic Panel: Recent Labs  Lab 11/16/19 0932 11/17/19 0605 11/18/19 0941  11/19/19 0337  NA 136 139 134* 135  K 3.0* 3.1* 3.8 3.9  CL 98 101 100 101  CO2 29 28 27 28   GLUCOSE 204* 142* 169* 140*  BUN 13 12 13 12   CREATININE 0.53* 0.57* 0.54* 0.46*  CALCIUM 8.4* 8.2* 8.2* 8.5*  MG  --  1.7  --   --    GFR: Estimated Creatinine Clearance: 105.6 mL/min (A) (by C-G formula based on SCr of 0.46 mg/dL (L)). Liver Function Tests: No results for input(s): AST, ALT, ALKPHOS, BILITOT, PROT, ALBUMIN in the last 168 hours. No results for input(s): LIPASE, AMYLASE in the last 168 hours. No results for input(s): AMMONIA in the last 168 hours. Coagulation Profile: No results for input(s): INR, PROTIME in the last 168 hours. Cardiac Enzymes: No results for input(s): CKTOTAL, CKMB, CKMBINDEX, TROPONINI in the last 168 hours. BNP (last 3 results) No results for input(s): PROBNP in the last 8760 hours. HbA1C: No results for input(s): HGBA1C in the last 72 hours. CBG: Recent Labs  Lab 11/21/19 1132 11/21/19 1636 11/21/19 2039 11/22/19 0745 11/22/19 1157  GLUCAP 158* 137* 146* 113* 161*   Lipid Profile: No results for input(s): CHOL, HDL, LDLCALC, TRIG, CHOLHDL, LDLDIRECT in the last 72 hours. Thyroid Function Tests: No results for input(s): TSH, T4TOTAL, FREET4, T3FREE, THYROIDAB in the last 72 hours. Anemia Panel: Recent Labs    11/20/19 1117  VITAMINB12 2,464*  FOLATE 20.5  FERRITIN 77  TIBC 256  IRON 36*  RETICCTPCT 5.1*   Sepsis Labs: No results for input(s): PROCALCITON, LATICACIDVEN in the last 168 hours.  Recent Results (from the past 240 hour(s))  MRSA PCR Screening     Status: Abnormal   Collection Time: 11/20/19  9:03 AM   Specimen: Nasal Mucosa; Nasopharyngeal  Result Value Ref Range Status   MRSA by PCR POSITIVE (A) NEGATIVE Final    Comment:        The GeneXpert MRSA Assay (FDA approved for NASAL specimens only), is one component of a comprehensive MRSA colonization surveillance program. It is not intended to diagnose  MRSA infection nor to guide or monitor treatment for MRSA infections. RESULT CALLED TO, READ BACK BY AND VERIFIED WITH: [facility] RN AT 9326 ON 11/20/19 SNG  Performed at [facility], [address], [place], [place] [address]   Respiratory Panel by RT PCR (Flu A&B, Covid) - Nasopharyngeal Swab     Status: None   Collection Time: 11/20/19 11:46 AM   Specimen: Nasopharyngeal Swab  Result Value Ref Range Status   SARS Coronavirus 2 by RT PCR NEGATIVE NEGATIVE Final    Comment: (NOTE) SARS-CoV-2 target nucleic acids are NOT DETECTED. The SARS-CoV-2 RNA is generally detectable in upper respiratoy specimens during the acute phase of infection. The lowest concentration of SARS-CoV-2 viral copies this assay can detect is 131 copies/mL. A negative result does not preclude SARS-Cov-2 infection and should not be used as the sole basis for treatment or other patient management decisions.  A negative result may occur with  improper specimen collection/handling, submission of specimen other than nasopharyngeal swab, presence of viral mutation(s) within the areas targeted by this assay, and inadequate number of viral copies (<131 copies/mL). A negative result must be combined with clinical observations, patient history, and epidemiological information. The expected result is Negative. Fact Sheet for Patients:  PinkCheek.be Fact Sheet for Healthcare Providers:  GravelBags.it This test is not yet ap proved or cleared by the [place] FDA and  has been authorized for detection and/or diagnosis of SARS-CoV-2 by FDA under an Emergency Use Authorization (EUA). This EUA will remain  in effect (meaning this test can be used) for the duration of the COVID-19 declaration under Section 564(b)(1) of the Act, 21 U.S.C. section 360bbb-3(b)(1), unless the authorization is terminated or revoked sooner.    Influenza A by PCR  NEGATIVE NEGATIVE Final   Influenza B by PCR NEGATIVE NEGATIVE Final    Comment: (NOTE) The Xpert Xpress SARS-CoV-2/FLU/RSV assay is intended as an aid in  the diagnosis of influenza from Nasopharyngeal swab specimens and  should not be used as a sole basis for treatment. Nasal washings and  aspirates are unacceptable for Xpert Xpress SARS-CoV-2/FLU/RSV  testing. Fact Sheet for Patients: PinkCheek.be Fact Sheet for Healthcare Providers: GravelBags.it This test is not yet approved or cleared by the [place] FDA and  has been authorized for detection and/or diagnosis of SARS-CoV-2 by  FDA under an Emergency Use Authorization (EUA). This EUA will remain  in effect (meaning this test can be used) for the duration of the  Covid-19 declaration under Section 564(b)(1) of the Act, 21  U.S.C. section 360bbb-3(b)(1), unless the authorization is  terminated or revoked. Performed at [facility], [address] [place], [place] [address]          Radiology Studies: No results found.      Scheduled Meds: . acidophilus  1 capsule Oral Daily  . amoxicillin-clavulanate  1 tablet Oral Q12H  . apixaban  5 mg Oral BID  . vitamin C  500 mg Oral Daily  . Chlorhexidine Gluconate Cloth  6 each Topical Q0600  . cholecalciferol  5,000 Units Oral Daily  . docusate sodium  100 mg Oral Daily  . ferrous sulfate  325 mg Oral BID WC  . gabapentin  1,200 mg Oral TID  . insulin aspart  0-15 Units Subcutaneous TID PC & HS  . linezolid  600 mg Oral Q12H  . metoprolol tartrate  50 mg Oral BID  . multivitamin with minerals  1 tablet Oral Daily  . mupirocin ointment  1 application Nasal BID  . nutrition supplement (JUVEN)  1 packet Oral BID WC  . nystatin   Topical TID  . pantoprazole  20 mg Oral Daily  . Ensure Max Protein  11 oz Oral BID BM  . vitamin B-6  50 mg Oral Daily  . sodium chloride flush  10 mL Intravenous Q12H    Continuous Infusions: . sodium chloride 1,000 mL (11/17/19 0230)     LOS: 11 days    Time spent: 35 minutes     Sharen Hones, MD [facility]   To contact the attending provider between 7A-7P or the covering provider during after hours 7P-7A, please log into the web site [email or web address] and access using universal [place] password for that web site. If you do not have the password, please call the hospital operator.  11/22/2019, 1:54 PM

## 2019-11-22 NOTE — Progress Notes (Signed)
Occupational Therapy Treatment Patient Details Name: [name]. MRN: [id number] DOB: July 22, 1956 Today's Date: 11/22/2019    History of present illness Pt admitted for sepsis secondary to wound infection and cellulitis. Pt is now s/p I&D of R residual limb on 11/12/19. 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 Other PMH includes expressive aphasia s/p L PCA CVA, Afib, and GERD.   OT comments  Pt seen for OT evaluation this date to f/u re: safety and independence with ADL mobility as well as address strengthening for ADLs/ADL mobility. Pt continues to be generally confused with difficulty initiating/sequencing tasks when cued. Pt does demonstrate slight improvement in initiation of reaching across midline with R hand for L bed rail to increase participation in sup to sit transition. Pt tolerates EOB sitting ~10-15 mins while OT/PT attempt to engage pt in therapeutic activity/exercise. Pt demos somewhat better command following for PT for LB exercises, but very little effort by pt to complete UB exercises. Pt noted to be mostly sitting on L hip throughout, with L lateral lean as if to offload the R residual limb. Pt still with significant c/o pain. Requires MAX +2 for bed mobility and MIN to MOD A to sustain static sit this date. Overall, SNF remains most appropriate d/c recommendation.   Follow Up Recommendations  SNF;Supervision/Assistance - 24 hour    Equipment Recommendations  3 in 1 bedside commode    Recommendations for Other Services      Precautions / Restrictions Precautions Precautions: Fall Restrictions Weight Bearing Restrictions: No RLE Weight Bearing: Non weight bearing       Mobility Bed Mobility Overal bed mobility: Needs Assistance Bed Mobility: Supine to Sit;Sit to Supine     Supine to sit: +2 for safety/equipment;+2 for physical assistance;HOB elevated;Max assist Sit to supine: Max assist;Total assist;+2 for physical  assistance;+2 for safety/equipment;HOB elevated   General bed mobility comments: Pt with somewhat improved sequencing of assisting with transition to EOB sitting, including making effort to reach across midline with R UE to reach toward his L side and bed rail to engage in mobility.  Transfers Overall transfer level: Needs assistance   Transfers: Lateral/Scoot Transfers          Lateral/Scoot Transfers: Total assist;+2 physical assistance General transfer comment: pt did not participate in lateral scoot today despite nearly completing with independence in EOB sitting yesterday. Multiple multimodal cues utilized.    Balance Overall balance assessment: Needs assistance Sitting-balance support: Bilateral upper extremity supported;Feet supported Sitting balance-Leahy Scale: Poor Sitting balance - Comments: constant tactile cues and verbal to remain upright.  increased assist required toward end of sitting up period as pt fatigues. Postural control: Left lateral lean Standing balance support: Bilateral upper extremity supported Standing balance-Leahy Scale: Poor Standing balance comment: NT                           ADL either performed or assessed with clinical judgement   ADL                   Upper Body Dressing : Moderate assistance;Bed level   Lower Body Dressing: Total assistance;Bed level                 General ADL Comments: pt declines to participate in LB dressing at bed level of L UE to don sock.     Vision Baseline Vision/History: Wears glasses Wears Glasses: At all times  Patient Visual Report: No change from baseline     Perception     Praxis      Cognition Arousal/Alertness: Awake/alert Behavior During Therapy: Agitated;Restless Overall Cognitive Status: Impaired/Different from baseline Area of Impairment: Orientation;Following commands;Safety/judgement                 Orientation Level: Disoriented  to;Place;Situation;Time     Following Commands: Follows one step commands inconsistently Safety/Judgement: Decreased awareness of safety;Decreased awareness of deficits     General Comments: Pt's spouse reports pt has so far only had tylenol for pain in an attempt to help with lucidity. Pt still very confused. Somewhat improved direction following noted, but overall poor focus and task sequencing.        Exercises Other Exercises Other Exercises: OT attempts to engage pt in bed-level, non-resisted exercises including straight arm rasises and forward punches, pt very minimally initially engaged, but then stops short of completing a set of either, saying "baby, I can't"   Shoulder Instructions       General Comments      Pertinent Vitals/ Pain       Pain Assessment: Faces Faces Pain Scale: Hurts whole lot Pain Location: "all over" Pain Descriptors / Indicators: Moaning;Grimacing;Guarding;Restless Pain Intervention(s): Limited activity within patient's tolerance;Monitored during session;Repositioned  Home Living                                          Prior Functioning/Environment              Frequency  Min 2X/week        Progress Toward Goals  OT Goals(current goals can now be found in the care plan section)  Progress towards OT goals: Not progressing toward goals - comment(pt limited by pain and cognitive status this date, but somewhat improved task initiation with cues.)  Acute Rehab OT Goals Patient Stated Goal: Pt does eventually endorse that he would like to walk again when asked directly and spouse supports this goal OT Goal Formulation: With patient/family Time For Goal Achievement: 11/29/19 Potential to Achieve Goals: Good  Plan Discharge plan remains appropriate    Co-evaluation    PT/OT/SLP Co-Evaluation/Treatment: Yes Reason for Co-Treatment: Complexity of the patient's impairments (multi-system involvement);Necessary to address  cognition/behavior during functional activity;For patient/therapist safety;To address functional/ADL transfers PT goals addressed during session: Mobility/safety with mobility;Balance;Strengthening/ROM OT goals addressed during session: ADL's and self-care;Strengthening/ROM      AM-PAC OT "6 Clicks" Daily Activity     Outcome Measure   Help from another person eating meals?: A Little Help from another person taking care of personal grooming?: A Little Help from another person toileting, which includes using toliet, bedpan, or urinal?: Total Help from another person bathing (including washing, rinsing, drying)?: A Lot Help from another person to put on and taking off regular upper body clothing?: A Lot Help from another person to put on and taking off regular lower body clothing?: Total 6 Click Score: 12    End of Session    OT Visit Diagnosis: Other abnormalities of gait and mobility (R26.89);Muscle weakness (generalized) (M62.81)   Activity Tolerance Treatment limited secondary to agitation;Patient limited by pain   Patient Left in bed;with call bell/phone within reach;with bed alarm set;with family/visitor present   Nurse Communication          Time: [id number] OT Time Calculation (min): 25 min  Charges: OT General  Charges $OT Visit: 1 Visit OT Treatments $Therapeutic Activity: 8-22 mins  Gerrianne Scale, Hall[place], OTR/L ascom [phone] 11/22/19, 3:37 PM

## 2019-11-22 NOTE — Progress Notes (Signed)
Pt very confused and pulling at tele lines and pulled IV out. NP contacted, ok to leave IV out at this time.

## 2019-11-22 NOTE — TOC Progression Note (Signed)
Transition of Care (TOC) - Progression Note    Patient Details  Name: [name]. MRN: [id number] Date of Birth: 06-16-1956  Transition of Care [facility]) CM/SW Contact  Su Hilt, RN Phone Number: 11/22/2019, 10:37 AM  Clinical Narrative:    Finis Bud with [facility] to inquire about auth, left a VM for a call back, left contact information        Expected Discharge Plan and Services                                                 Social Determinants of Health (SDOH) Interventions    Readmission Risk Interventions No flowsheet data found.

## 2019-11-23 DIAGNOSIS — D500 Iron deficiency anemia secondary to blood loss (chronic): Secondary | ICD-10-CM

## 2019-11-23 DIAGNOSIS — K7031 Alcoholic cirrhosis of liver with ascites: Secondary | ICD-10-CM

## 2019-11-23 DIAGNOSIS — A419 Sepsis, unspecified organism: Secondary | ICD-10-CM | POA: Diagnosis not present

## 2019-11-23 LAB — CBC
HCT: 26 % — ABNORMAL LOW (ref 39.0–52.0)
Hemoglobin: 7.8 g/dL — ABNORMAL LOW (ref 13.0–17.0)
MCH: 25.5 pg — ABNORMAL LOW (ref 26.0–34.0)
MCHC: 30 g/dL (ref 30.0–36.0)
MCV: 85 fL (ref 80.0–100.0)
Platelets: 104 10*3/uL — ABNORMAL LOW (ref 150–400)
RBC: 3.06 MIL/uL — ABNORMAL LOW (ref 4.22–5.81)
RDW: 19.4 % — ABNORMAL HIGH (ref 11.5–15.5)
WBC: 7.7 10*3/uL (ref 4.0–10.5)
nRBC: 0 % (ref 0.0–0.2)

## 2019-11-23 LAB — BASIC METABOLIC PANEL
Anion gap: 9 (ref 5–15)
BUN: 13 mg/dL (ref 8–23)
CO2: 25 mmol/L (ref 22–32)
Calcium: 8.5 mg/dL — ABNORMAL LOW (ref 8.9–10.3)
Chloride: 102 mmol/L (ref 98–111)
Creatinine, Ser: 0.69 mg/dL (ref 0.61–1.24)
GFR calc Af Amer: >60 mL/min (ref >60)
GFR calc non Af Amer: >60 mL/min (ref >60)
Glucose, Bld: 136 mg/dL — ABNORMAL HIGH (ref 70–99)
Potassium: 3.7 mmol/L (ref 3.5–5.1)
Sodium: 136 mmol/L (ref 135–145)

## 2019-11-23 LAB — GLUCOSE, CAPILLARY
Glucose-Capillary: 120 mg/dL — ABNORMAL HIGH (ref 70–99)
Glucose-Capillary: 156 mg/dL — ABNORMAL HIGH (ref 70–99)
Glucose-Capillary: 149 mg/dL — ABNORMAL HIGH (ref 70–99)
Glucose-Capillary: 143 mg/dL — ABNORMAL HIGH (ref 70–99)

## 2019-11-23 MED ORDER — FUROSEMIDE 20 MG PO TABS
20.0000 mg | ORAL_TABLET | Freq: Every day | ORAL | Status: DC
  Administered 2019-11-23 – 2019-11-29 (×7): 20 mg via ORAL
  Filled 2019-11-23 (×7): qty 1

## 2019-11-23 MED ORDER — SULFAMETHOXAZOLE-TRIMETHOPRIM 800-160 MG PO TABS
1.0000 | ORAL_TABLET | Freq: Two times a day (BID) | ORAL | Status: DC
  Administered 2019-11-23 – 2019-11-29 (×13): 1 via ORAL
  Filled 2019-11-23 (×13): qty 1

## 2019-11-23 MED ORDER — SPIRONOLACTONE 25 MG PO TABS
25.0000 mg | ORAL_TABLET | Freq: Every day | ORAL | Status: DC
  Administered 2019-11-23 – 2019-11-29 (×7): 25 mg via ORAL
  Filled 2019-11-23 (×7): qty 1

## 2019-11-23 MED ORDER — OXYCODONE-ACETAMINOPHEN 5-325 MG PO TABS: 1.0000 | ORAL_TABLET | ORAL | Status: DC | PRN

## 2019-11-23 NOTE — Progress Notes (Signed)
Nutrition Follow-up  DOCUMENTATION CODES:   Not applicable  INTERVENTION:  Continue Ensure Max Protein po BID between meals, each supplement provides 150 kcal and 30 grams of protein.  Continue Juven 1 pack po BID with meals, each supplement provides 95 kcal, 2.5 grams collagen protein, 7 grams L-Arginine, 7 grams L-Glutamine, 300 mg vitamin C, 9.5 mg zinc, and other micronutrients essential for wound healing.  Continue daily MVI and vitamin C 500 mg daily.  NUTRITION DIAGNOSIS:   Increased nutrient needs related to wound healing as evidenced by estimated needs.  Ongoing.  GOAL:   Patient will meet greater than or equal to 90% of their needs  Progressing.  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  Met with patient and his wife at bedside. Patient reports his appetite is great. Wife reports patient's appetite has been starting to improve over the last couple days and he is eating better at meals. Patient drank Juven this morning and was working on a bottle of Ensure Max at time of RD assessment. Discussed importance of adequate protein intake. Encouraged patient to choose a good source of protein at meals and then to drink Ensure Max BID and Juven BID to promote wound healing.  Medications reviewed and include: acidophilus, Augmentin, vitamin C 500 mg daily, vitamin D3 5000 units daily, Colace 100 mg daily, ferrous sulfate 325 mg BID, Lasix 20 mg daily, Novolog 0-15 units TID and QHS, MVI daily, Protonix, vitamin B6 50 mg daily, spironolactone.  Labs  reviewed: CBG 120-186.  NUTRITION - FOCUSED PHYSICAL EXAM:    Most Recent Value  Orbital Region  No depletion  Upper Arm Region  Mild depletion  Thoracic and Lumbar Region  No depletion  Buccal Region  No depletion  Temple Region  Mild depletion  Clavicle Bone Region  No depletion  Clavicle and Acromion Bone Region  Mild depletion  Scapular Bone Region  No depletion  Dorsal Hand  Mild depletion  Patellar Region  Unable to assess  Anterior Thigh Region  Unable to assess  Posterior Calf Region  Unable to assess  Edema (RD Assessment)  Severe [left lower extremity]  Hair  Reviewed  Eyes  Reviewed  Mouth  Reviewed  Skin  Reviewed  Nails  Reviewed     Diet Order:   Diet Order            Diet Carb Modified Fluid consistency: Thin; Room service appropriate? Yes  Diet effective now             EDUCATION NEEDS:   Education needs have been addressed  Skin:  Skin Assessment: Skin Integrity Issues:(s/p right AKA with wound VAC)  Last BM:  11/23/2019 large type 7  Height:   Ht Readings from Last 1 Encounters:  11/10/19 '5\' 9"'$  (1.753 m)   Weight:   Wt Readings from Last 1 Encounters:  11/21/19 96.8 kg   BMI:  Body mass index is 31.51 kg/m.  Estimated Nutritional Needs:   Kcal:  4825-0037  Protein:  115-125 grams  Fluid:  >/= 2 L/day  Jacklynn Barnacle, MS, RD,  LDN Pager number available on Amion

## 2019-11-23 NOTE — Progress Notes (Signed)
PROGRESS NOTE    Glenn B Sisemore Jr.  [id number] DOB: [birth year] DOA: 11/10/2019 PCP: [name], DO (Confirm with patient/family/NH records and if not entered, this HAS to be entered at [facility] point of entry. "No PCP" if truly none.)   Brief Narrative: (Start on day 1 of progress note - keep it brief and live) 64 year old male with history of CVA, type 2 diabetes, memory loss, liver cirrhosis with history of alcohol abuse, peripheral vascular disease, who was admitted to the hospital for right above-knee amputation stump abscess.  He also was septic at time of admission. He has been seen by infectious disease, I&D was performed, the wound grew MRSA.  He has completed IV Unasyn and Zyvox, currently, taking Augmentin and Zyvox orally until April 30. Due to significant weakness, disability, patient will be placed in a nursing home rehab.  Pending insurance preauthorization.    Assessment & Plan:   Active Problems:   Sepsis ([place])  #1. sepsis secondary to right stump infection with abscess. Status post I&D.  Patient developed mild thrombocytopenia, discussed with infectious disease, antibiotics changed to Bactrim and Augmentin, last dose 4/30.  #2.  Thrombocytopenia. Slightly worsening today.  Infect disease is a concern about possibility of Zyvox making it worse.  Discontinued Zyvox.  Continue to follow.  3.  Iron deficient anemia. Continue oral supplement.  Hemoglobin is worse today, recheck a CBC tomorrow, transfuse with hemoglobin less than 7.  No active bleeding.  #4.  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 Patient seem to have worsening left leg edema, also has increased ascites.  Will start diuretics with oral Lasix 20 mg daily, Aldactone 25 mg daily.  #5.  Liver lesions. Negative AFP.  Follow-up as outpatient.  6.  Permanent atrial fibrillation. Continue Eliquis.  7.  Type 2 diabetes. Continue sliding scale insulin.  8.  Acute  confusion. Condition had improved today.       DVT prophylaxis: Eliquis Code Status: Full Family Communication: Treatment plan discussed with the patient and his wife. Disposition Plan:  . Patient came from:Home            . Anticipated d/c place:ECF . Barriers to d/c OR conditions which need to be met to effect a safe d/c:   Consultants:   ID  Procedures:  Antimicrobials: Bactrim and Augmentin until 4/30 Subjective: Patient feels much better today.  He is more awake, no confusion today.  He complaining of left leg edema, some abdominal distention.  No abdominal pain.  No nausea vomiting.  No diarrhea.  No fever or chills.  Objective: Vitals:   11/22/19 1610 11/22/19 2221 11/22/19 2239 11/23/19 0816  BP: (!) 131/93 127/79  116/78  Pulse: 91 (!) 129 (!) 110 96  Resp:    18  Temp: 97.6 F (36.4 C) 100.3 F (37.9 C)  98.8 F (37.1 C)  TempSrc: Oral Oral  Oral  SpO2: 98% 98%  97%  Weight:      Height:        Intake/Output Summary (Last 24 hours) at 11/23/2019 1340 Last data filed at 11/23/2019 0724 Gross per 24 hour  Intake --  Output 25 ml  Net -25 ml   Filed Weights   11/19/19 0417 11/20/19 0129 11/21/19 0543  Weight: 93 kg 93.4 kg 96.8 kg    Examination:  General exam: Appears calm and comfortable  Respiratory system: Clear to auscultation. Respiratory effort normal. Cardiovascular system: S1 & S2 heard, RRR. No JVD, murmurs, rubs,  gallops or clicks. 2+ left leg edema Gastrointestinal system: Abdomen is distended, soft and nontender. No organomegaly or masses felt. Normal bowel sounds heard. Central nervous system: Alert and oriented. No focal neurological deficits. Extremities: Symmetric 5 x 5 power. Skin: No rashes, lesions or ulcers Psychiatry: Judgement and insight appear normal. Mood & affect appropriate.     Data Reviewed: I have personally reviewed following labs and imaging studies  CBC: Recent Labs  Lab 11/17/19 0605 11/19/19 0337  11/19/19 1238 11/21/19 0608 11/23/19 0522  WBC 10.6* 9.3 10.7* 8.5 7.7  HGB 8.3* 8.4* 9.1* 8.5* 7.8*  HCT 27.5* 28.6* 31.0* 27.8* 26.0*  MCV 84.6 85.6 86.6 83.0 85.0  PLT 72* 116* 120* 113* 778*   Basic Metabolic Panel: Recent Labs  Lab 11/17/19 0605 11/18/19 0941 11/19/19 0337 11/23/19 0522  NA 139 134* 135 136  K 3.1* 3.8 3.9 3.7  CL 101 100 101 102  CO2 28 27 28 25   GLUCOSE 142* 169* 140* 136*  BUN 12 13 12 13   CREATININE 0.57* 0.54* 0.46* 0.69  CALCIUM 8.2* 8.2* 8.5* 8.5*  MG 1.7  --   --   --    GFR: Estimated Creatinine Clearance: 105.6 mL/min (by C-G formula based on SCr of 0.69 mg/dL). Liver Function Tests: No results for input(s): AST, ALT, ALKPHOS, BILITOT, PROT, ALBUMIN in the last 168 hours. No results for input(s): LIPASE, AMYLASE in the last 168 hours. No results for input(s): AMMONIA in the last 168 hours. Coagulation Profile: No results for input(s): INR, PROTIME in the last 168 hours. Cardiac Enzymes: No results for input(s): CKTOTAL, CKMB, CKMBINDEX, TROPONINI in the last 168 hours. BNP (last 3 results) No results for input(s): PROBNP in the last 8760 hours. HbA1C: No results for input(s): HGBA1C in the last 72 hours. CBG: Recent Labs  Lab 11/22/19 1157 11/22/19 1633 11/22/19 2218 11/23/19 0818 11/23/19 1137  GLUCAP 161* 186* 142* 120* 156*   Lipid Profile: No results for input(s): CHOL, HDL, LDLCALC, TRIG, CHOLHDL, LDLDIRECT in the last 72 hours. Thyroid Function Tests: No results for input(s): TSH, T4TOTAL, FREET4, T3FREE, THYROIDAB in the last 72 hours. Anemia Panel: No results for input(s): VITAMINB12, FOLATE, FERRITIN, TIBC, IRON, RETICCTPCT in the last 72 hours. Sepsis Labs: No results for input(s): PROCALCITON, LATICACIDVEN in the last 168 hours.  Recent Results (from the past 240 hour(s))  MRSA PCR Screening     Status: Abnormal   Collection Time: 11/20/19  9:03 AM   Specimen: Nasal Mucosa; Nasopharyngeal  Result Value Ref  Range Status   MRSA by PCR POSITIVE (A) NEGATIVE Final    Comment:        The GeneXpert MRSA Assay (FDA approved for NASAL specimens only), is one component of a comprehensive MRSA colonization surveillance program. It is not intended to diagnose MRSA infection nor to guide or monitor treatment for MRSA infections. RESULT CALLED TO, READ BACK BY AND VERIFIED WITH: SERENITY [facility] RN AT 2423 ON 11/20/19 SNG  Performed at [facility], [address] [place], [place] [address]   Respiratory Panel by RT PCR (Flu A&B, Covid) - Nasopharyngeal Swab     Status: None   Collection Time: 11/20/19 11:46 AM   Specimen: Nasopharyngeal Swab  Result Value Ref Range Status   SARS Coronavirus 2 by RT PCR NEGATIVE NEGATIVE Final    Comment: (NOTE) SARS-CoV-2 target nucleic acids are NOT DETECTED. The SARS-CoV-2 RNA is generally detectable in upper respiratoy specimens during the acute phase of infection. The  lowest concentration of SARS-CoV-2 viral copies this assay can detect is 131 copies/mL. A negative result does not preclude SARS-Cov-2 infection and should not be used as the sole basis for treatment or other patient management decisions. A negative result may occur with  improper specimen collection/handling, submission of specimen other than nasopharyngeal swab, presence of viral mutation(s) within the areas targeted by this assay, and inadequate number of viral copies (<131 copies/mL). A negative result must be combined with clinical observations, patient history, and epidemiological information. The expected result is Negative. Fact Sheet for Patients:  PinkCheek.be Fact Sheet for Healthcare Providers:  GravelBags.it This test is not yet ap proved or cleared by the [place] FDA and  has been authorized for detection and/or diagnosis of SARS-CoV-2 by FDA under an Emergency Use Authorization (EUA). This EUA will  remain  in effect (meaning this test can be used) for the duration of the COVID-19 declaration under Section 564(b)(1) of the Act, 21 U.S.C. section 360bbb-3(b)(1), unless the authorization is terminated or revoked sooner.    Influenza A by PCR NEGATIVE NEGATIVE Final   Influenza B by PCR NEGATIVE NEGATIVE Final    Comment: (NOTE) The Xpert Xpress SARS-CoV-2/FLU/RSV assay is intended as an aid in  the diagnosis of influenza from Nasopharyngeal swab specimens and  should not be used as a sole basis for treatment. Nasal washings and  aspirates are unacceptable for Xpert Xpress SARS-CoV-2/FLU/RSV  testing. Fact Sheet for Patients: PinkCheek.be Fact Sheet for Healthcare Providers: GravelBags.it This test is not yet approved or cleared by the [place] FDA and  has been authorized for detection and/or diagnosis of SARS-CoV-2 by  FDA under an Emergency Use Authorization (EUA). This EUA will remain  in effect (meaning this test can be used) for the duration of the  Covid-19 declaration under Section 564(b)(1) of the Act, 21  U.S.C. section 360bbb-3(b)(1), unless the authorization is  terminated or revoked. Performed at [facility], [address], [place], [place] [address]          Radiology Studies: No results found.      Scheduled Meds: . acidophilus  1 capsule Oral Daily  . amoxicillin-clavulanate  1 tablet Oral Q12H  . apixaban  5 mg Oral BID  . vitamin C  500 mg Oral Daily  . Chlorhexidine Gluconate Cloth  6 each Topical Q0600  . cholecalciferol  5,000 Units Oral Daily  . docusate sodium  100 mg Oral Daily  . ferrous sulfate  325 mg Oral BID WC  . furosemide  20 mg Oral Daily  . gabapentin  1,200 mg Oral TID  . insulin aspart  0-15 Units Subcutaneous TID PC & HS  . metoprolol tartrate  50 mg Oral BID  . multivitamin with minerals  1 tablet Oral Daily  . mupirocin ointment  1 application Nasal  BID  . nutrition supplement (JUVEN)  1 packet Oral BID WC  . nystatin   Topical TID  . pantoprazole  20 mg Oral Daily  . Ensure Max Protein  11 oz Oral BID BM  . vitamin B-6  50 mg Oral Daily  . sodium chloride flush  10 mL Intravenous Q12H  . spironolactone  25 mg Oral Daily  . sulfamethoxazole-trimethoprim  1 tablet Oral Q12H   Continuous Infusions: . sodium chloride 1,000 mL (11/17/19 0230)     LOS: 12 days    Time spent: 35 minutes    Sharen Hones, MD [facility]   To contact the attending  provider between 7A-7P or the covering provider during after hours 7P-7A, please log into the web site [email or web address] and access using universal [place] password for that web site. If you do not have the password, please call the hospital operator.  11/23/2019, 1:40 PM

## 2019-11-23 NOTE — Care Management Important Message (Signed)
Important Message  Patient Details  Name: [name]. MRN: [id number] Date of Birth: 25-May-1956   Medicare Important Message Given:  Yes     Chelsa Stout Jen Mow, RN 11/23/2019, 12:14 PM

## 2019-11-24 ENCOUNTER — Inpatient Hospital Stay: Payer: Medicare HMO | Admitting: Oncology

## 2019-11-24 ENCOUNTER — Telehealth: Payer: Self-pay

## 2019-11-24 LAB — CBC WITH DIFFERENTIAL/PLATELET
Abs Immature Granulocytes: 0.04 10*3/uL (ref 0.00–0.07)
Basophils Absolute: 0.1 10*3/uL (ref 0.0–0.1)
Basophils Relative: 1 %
Eosinophils Absolute: 0.2 10*3/uL (ref 0.0–0.5)
Eosinophils Relative: 3 %
HCT: 25.6 % — ABNORMAL LOW (ref 39.0–52.0)
Hemoglobin: 7.9 g/dL — ABNORMAL LOW (ref 13.0–17.0)
Immature Granulocytes: 1 %
Lymphocytes Relative: 16 %
Lymphs Abs: 1.2 10*3/uL (ref 0.7–4.0)
MCH: 25.6 pg — ABNORMAL LOW (ref 26.0–34.0)
MCHC: 30.9 g/dL (ref 30.0–36.0)
MCV: 83.1 fL (ref 80.0–100.0)
Monocytes Absolute: 0.5 10*3/uL (ref 0.1–1.0)
Monocytes Relative: 6 %
Neutro Abs: 5.8 10*3/uL (ref 1.7–7.7)
Neutrophils Relative %: 73 %
Platelets: 101 10*3/uL — ABNORMAL LOW (ref 150–400)
RBC: 3.08 MIL/uL — ABNORMAL LOW (ref 4.22–5.81)
RDW: 19.6 % — ABNORMAL HIGH (ref 11.5–15.5)
WBC: 7.7 10*3/uL (ref 4.0–10.5)
nRBC: 0 % (ref 0.0–0.2)

## 2019-11-24 LAB — BASIC METABOLIC PANEL
Anion gap: 10 (ref 5–15)
BUN: 18 mg/dL (ref 8–23)
CO2: 25 mmol/L (ref 22–32)
Calcium: 8.6 mg/dL — ABNORMAL LOW (ref 8.9–10.3)
Chloride: 102 mmol/L (ref 98–111)
Creatinine, Ser: 0.67 mg/dL (ref 0.61–1.24)
GFR calc Af Amer: >60 mL/min (ref >60)
GFR calc non Af Amer: >60 mL/min (ref >60)
Glucose, Bld: 133 mg/dL — ABNORMAL HIGH (ref 70–99)
Potassium: 3.7 mmol/L (ref 3.5–5.1)
Sodium: 137 mmol/L (ref 135–145)

## 2019-11-24 LAB — GLUCOSE, CAPILLARY
Glucose-Capillary: 112 mg/dL — ABNORMAL HIGH (ref 70–99)
Glucose-Capillary: 179 mg/dL — ABNORMAL HIGH (ref 70–99)
Glucose-Capillary: 148 mg/dL — ABNORMAL HIGH (ref 70–99)
Glucose-Capillary: 142 mg/dL — ABNORMAL HIGH (ref 70–99)

## 2019-11-24 LAB — MAGNESIUM: Magnesium: 1.9 mg/dL (ref 1.7–2.4)

## 2019-11-24 MED ORDER — CHLORHEXIDINE GLUCONATE CLOTH 2 % EX PADS
6.0000 | MEDICATED_PAD | Freq: Every day | CUTANEOUS | Status: AC
  Administered 2019-11-24 – 2019-11-28 (×5): 6 via TOPICAL

## 2019-11-24 MED ORDER — MUPIROCIN 2 % EX OINT
TOPICAL_OINTMENT | Freq: Every day | CUTANEOUS | Status: DC
  Filled 2019-11-24: qty 22

## 2019-11-24 NOTE — TOC Progression Note (Signed)
Transition of Care (TOC) - Progression Note    Patient Details  Name: [name]. MRN: [id number] Date of Birth: [birth year]  [facility]) CM/SW Contact  Shelbie Ammons, RN Phone Number: 11/24/2019, 12:39 PM  Clinical Narrative:   RNCM contacted Debra at SNF to inquire about insurance authorization. She called back and reported that the insurance co is now asking for updated PT, wound, and progress notes from no longer than 48 hours. After they were written wound consult and PT notes were faxed to Hilda Blades so she could forward them on to [place]. RNCM will continue to follow and update when [name] is obtained.          Expected Discharge Plan and Services                                                 Social Determinants of Health (SDOH) Interventions    Readmission Risk Interventions No flowsheet data found.

## 2019-11-24 NOTE — Progress Notes (Signed)
[facility] Daily Progress Note   Subjective: 11/11/19: 1. Irrigation and debridement ofright AKA stump - skin and soft tissue  Continued right stump / wound discomfort.  Objective: Vitals:   11/23/19 1608 11/23/19 1609 11/23/19 2205 11/24/19 0812  BP: 123/79  120/83 106/69  Pulse: (!) 112 95 (!) 103 82  Resp: 18  17 18   Temp: 98.8 F (37.1 C)  98.8 F (37.1 C) 98.1 F (36.7 C)  TempSrc: Oral  Oral Oral  SpO2: 98% 99% 98% 98%  Weight:      Height:        Intake/Output Summary (Last 24 hours) at 11/24/2019 1217 Last data filed at 11/23/2019 2214 Gross per 24 hour  Intake 0 ml  Output 320 ml  Net -320 ml   Physical Exam: A&Ox3, NAD CV: RRR Pulmonary: CTA Bilaterally Abdomen: Soft, Nontender, Nondistended Vascular:             Right Lower Extremity: Thigh edematous.  VAC change with the assistance of wound nurse.  Would estimate 85% granulation tissue. No foul odor.    Media Information   Document Information Photos    11/24/2019 11:06  Attached To:  Hospital Encounter on 11/10/19  Source Information Ronnette Rump, Janalyn Harder, PA-C  [facility] (1a)   Laboratory: CBC    Component Value Date/Time   WBC 7.7 11/24/2019 0332   HGB 7.9 (L) 11/24/2019 0332   HGB 11.2 (L) 10/23/2019 1403   HCT 25.6 (L) 11/24/2019 0332   HCT 35.0 (L) 10/23/2019 1403   PLT 101 (L) 11/24/2019 0332   PLT 199 10/23/2019 1403   BMET    Component Value Date/Time   NA 137 11/24/2019 0332   NA 140 10/23/2019 1403   K 3.7 11/24/2019 0332   CL 102 11/24/2019 0332   CO2 25 11/24/2019 0332   GLUCOSE 133 (H) 11/24/2019 0332   BUN 18 11/24/2019 0332   BUN 17 10/23/2019 1403   CREATININE 0.67 11/24/2019 0332   CALCIUM 8.6 (L) 11/24/2019 0332   GFRNONAA >60 11/24/2019 0332   GFRAA >60 11/24/2019 0332   Assessment/Planning: 64 year old male who presented to the [facility] emergency departments/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  VAC M-W-F. Continue 125, continuous. No plans for surgical intervention during this inpatient stay. We will continue to follow the wound in the outpatient setting.  2) Sepsis: ID following On IV ABX ID asking for CBC to be ordered at first post-op visit - message passed along to our NP  Discussed with Dr. Ellis Parents [facility] PA-C 11/24/2019 12:17 PM

## 2019-11-24 NOTE — Telephone Encounter (Signed)
Patient is currently inpatient and unsure of when he will be discharged.  Will cancel his MD virtual visit for today and she will contact us when she knows more about his discharge status.

## 2019-11-24 NOTE — Progress Notes (Signed)
PROGRESS NOTE    Glenn B Colledge Jr.  [id number] DOB: 1955-08-19 DOA: 11/10/2019 PCP: [name], DO (Confirm with patient/family/NH records and if not entered, this HAS to be entered at [facility] point of entry. "No PCP" if truly none.)   Brief Narrative: (Start on day 1 of progress note - keep it brief and live) 64 year old male with history of CVA, type 2 diabetes, memory loss, liver cirrhosis with history of alcohol abuse, peripheral vascular disease, who was admitted to the hospital for right above-knee amputation stump abscess. He also was septic at time of admission. He has been seen by infectious disease, I&D was performed, the wound grew MRSA. He has completed IV Unasyn and Zyvox, currently, taking Augmentin and Zyvox orally untilApril 30. Due to significant weakness, disability, patient will be placed in a nursing home rehab. Pending insurance preauthorization.   Assessment & Plan:   Active Problems:   Sepsis ([place])  #1.  Sepsis is secondary to right lower extremity stump infection and abscess. Status post I&D.  Current antibiotic treatment with Bactrim and Augmentin, last dose 4/30.  Condition had improved.  #2.  Thrombocytopenia. Mostly due to liver cirrhosis.  There is concern that this could be caused by Zyvox.  Zyvox discontinued.  Recheck a CBC tomorrow.  Condition appears to be stable.  3.  Iron deficient anemia. Continue oral supplement.  Recheck level tomorrow.  4.  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 Continue diuretics with oral Lasix and Aldactone started yesterday.  Patient had worsening leg edema yesterday.  5.  Liver lesions. Negative AFP, follow-up as outpatient.  6.  Permanent atrial fibrillation. Continue Eliquis.  7.  Type 2 diabetes. Continue sliding scale insulin.  8. Confusion. Condition had improved.    DVT prophylaxis: Eliquis. Code Status: Full Family Communication: Plan discussed with the patient with wife  presence. Disposition Plan:  . Patient came from:            . Anticipated d/c place: . Barriers to d/c OR conditions which need to be met to effect a safe d/c:   Consultants:   ID  Procedures:     Antimicrobials: Bactrim and Augmentin.  Subjective: Patient feel much better today.  Appetite without nausea vomiting.  No fever or chills.  No short of breath.  Objective: Vitals:   11/23/19 1608 11/23/19 1609 11/23/19 2205 11/24/19 0812  BP: 123/79  120/83 106/69  Pulse: (!) 112 95 (!) 103 82  Resp: 18  17 18   Temp: 98.8 F (37.1 C)  98.8 F (37.1 C) 98.1 F (36.7 C)  TempSrc: Oral  Oral Oral  SpO2: 98% 99% 98% 98%  Weight:      Height:        Intake/Output Summary (Last 24 hours) at 11/24/2019 1400 Last data filed at 11/23/2019 2214 Gross per 24 hour  Intake 0 ml  Output 320 ml  Net -320 ml   Filed Weights   11/19/19 0417 11/20/19 0129 11/21/19 0543  Weight: 93 kg 93.4 kg 96.8 kg    Examination:  General exam: Appears calm and comfortable  Respiratory system: Clear to auscultation. Respiratory effort normal. Cardiovascular system: S1 & S2 heard, RRR. No JVD, murmurs, rubs, gallops or clicks. 2-3+ left LE pedal edema. Gastrointestinal system: Abdomen is nondistended, soft and nontender. No organomegaly or masses felt. Normal bowel sounds heard. Central nervous system: Alert and oriented x2. No focal neurological deficits. Extremities: Symmetric 5 x 5 power. Skin: No rashes, lesions or  ulcers Psychiatry: Judgement and insight appear normal. Mood & affect appropriate.     Data Reviewed: I have personally reviewed following labs and imaging studies  CBC: Recent Labs  Lab 11/19/19 0337 11/19/19 1238 11/21/19 0608 11/23/19 0522 11/24/19 0332  WBC 9.3 10.7* 8.5 7.7 7.7  NEUTROABS  --   --   --   --  5.8  HGB 8.4* 9.1* 8.5* 7.8* 7.9*  HCT 28.6* 31.0* 27.8* 26.0* 25.6*  MCV 85.6 86.6 83.0 85.0 83.1  PLT 116* 120* 113* 104* 175*   Basic Metabolic  Panel: Recent Labs  Lab 11/18/19 0941 11/19/19 0337 11/23/19 0522 11/24/19 0332  NA 134* 135 136 137  K 3.8 3.9 3.7 3.7  CL 100 101 102 102  CO2 27 28 25 25   GLUCOSE 169* 140* 136* 133*  BUN 13 12 13 18   CREATININE 0.54* 0.46* 0.69 0.67  CALCIUM 8.2* 8.5* 8.5* 8.6*  MG  --   --   --  1.9   GFR: Estimated Creatinine Clearance: 105.6 mL/min (by C-G formula based on SCr of 0.67 mg/dL). Liver Function Tests: No results for input(s): AST, ALT, ALKPHOS, BILITOT, PROT, ALBUMIN in the last 168 hours. No results for input(s): LIPASE, AMYLASE in the last 168 hours. No results for input(s): AMMONIA in the last 168 hours. Coagulation Profile: No results for input(s): INR, PROTIME in the last 168 hours. Cardiac Enzymes: No results for input(s): CKTOTAL, CKMB, CKMBINDEX, TROPONINI in the last 168 hours. BNP (last 3 results) No results for input(s): PROBNP in the last 8760 hours. HbA1C: No results for input(s): HGBA1C in the last 72 hours. CBG: Recent Labs  Lab 11/23/19 1137 11/23/19 1615 11/23/19 2159 11/24/19 0810 11/24/19 1136  GLUCAP 156* 149* 143* 112* 179*   Lipid Profile: No results for input(s): CHOL, HDL, LDLCALC, TRIG, CHOLHDL, LDLDIRECT in the last 72 hours. Thyroid Function Tests: No results for input(s): TSH, T4TOTAL, FREET4, T3FREE, THYROIDAB in the last 72 hours. Anemia Panel: No results for input(s): VITAMINB12, FOLATE, FERRITIN, TIBC, IRON, RETICCTPCT in the last 72 hours. Sepsis Labs: No results for input(s): PROCALCITON, LATICACIDVEN in the last 168 hours.  Recent Results (from the past 240 hour(s))  MRSA PCR Screening     Status: Abnormal   Collection Time: 11/20/19  9:03 AM   Specimen: Nasal Mucosa; Nasopharyngeal  Result Value Ref Range Status   MRSA by PCR POSITIVE (A) NEGATIVE Final    Comment:        The GeneXpert MRSA Assay (FDA approved for NASAL specimens only), is one component of a comprehensive MRSA colonization surveillance program. It is  not intended to diagnose MRSA infection nor to guide or monitor treatment for MRSA infections. RESULT CALLED TO, READ BACK BY AND VERIFIED WITH: [facility] RN AT 1025 ON 11/20/19 SNG  Performed at [facility], [address] [place], [place] [address]   Respiratory Panel by RT PCR (Flu A&B, Covid) - Nasopharyngeal Swab     Status: None   Collection Time: 11/20/19 11:46 AM   Specimen: Nasopharyngeal Swab  Result Value Ref Range Status   SARS Coronavirus 2 by RT PCR NEGATIVE NEGATIVE Final    Comment: (NOTE) SARS-CoV-2 target nucleic acids are NOT DETECTED. The SARS-CoV-2 RNA is generally detectable in upper respiratoy specimens during the acute phase of infection. The lowest concentration of SARS-CoV-2 viral copies this assay can detect is 131 copies/mL. A negative result does not preclude SARS-Cov-2 infection and should not be used as the sole basis  for treatment or other patient management decisions. A negative result may occur with  improper specimen collection/handling, submission of specimen other than nasopharyngeal swab, presence of viral mutation(s) within the areas targeted by this assay, and inadequate number of viral copies (<131 copies/mL). A negative result must be combined with clinical observations, patient history, and epidemiological information. The expected result is Negative. Fact Sheet for Patients:  PinkCheek.be Fact Sheet for Healthcare Providers:  GravelBags.it This test is not yet ap proved or cleared by the [place] FDA and  has been authorized for detection and/or diagnosis of SARS-CoV-2 by FDA under an Emergency Use Authorization (EUA). This EUA will remain  in effect (meaning this test can be used) for the duration of the COVID-19 declaration under Section 564(b)(1) of the Act, 21 U.S.C. section 360bbb-3(b)(1), unless the authorization is terminated or revoked sooner.     Influenza A by PCR NEGATIVE NEGATIVE Final   Influenza B by PCR NEGATIVE NEGATIVE Final    Comment: (NOTE) The Xpert Xpress SARS-CoV-2/FLU/RSV assay is intended as an aid in  the diagnosis of influenza from Nasopharyngeal swab specimens and  should not be used as a sole basis for treatment. Nasal washings and  aspirates are unacceptable for Xpert Xpress SARS-CoV-2/FLU/RSV  testing. Fact Sheet for Patients: PinkCheek.be Fact Sheet for Healthcare Providers: GravelBags.it This test is not yet approved or cleared by the [place] FDA and  has been authorized for detection and/or diagnosis of SARS-CoV-2 by  FDA under an Emergency Use Authorization (EUA). This EUA will remain  in effect (meaning this test can be used) for the duration of the  Covid-19 declaration under Section 564(b)(1) of the Act, 21  U.S.C. section 360bbb-3(b)(1), unless the authorization is  terminated or revoked. Performed at [facility], [address], [place], [place] [address]          Radiology Studies: No results found.      Scheduled Meds: . acidophilus  1 capsule Oral Daily  . amoxicillin-clavulanate  1 tablet Oral Q12H  . apixaban  5 mg Oral BID  . vitamin C  500 mg Oral Daily  . Chlorhexidine Gluconate Cloth  6 each Topical Q0600  . Chlorhexidine Gluconate Cloth  6 each Topical Q0600  . cholecalciferol  5,000 Units Oral Daily  . docusate sodium  100 mg Oral Daily  . ferrous sulfate  325 mg Oral BID WC  . furosemide  20 mg Oral Daily  . gabapentin  1,200 mg Oral TID  . insulin aspart  0-15 Units Subcutaneous TID PC & HS  . metoprolol tartrate  50 mg Oral BID  . multivitamin with minerals  1 tablet Oral Daily  . mupirocin ointment  1 application Nasal BID  . mupirocin ointment   Topical Daily  . nutrition supplement (JUVEN)  1 packet Oral BID WC  . nystatin   Topical TID  . pantoprazole  20 mg Oral Daily  . Ensure  Max Protein  11 oz Oral BID BM  . vitamin B-6  50 mg Oral Daily  . sodium chloride flush  10 mL Intravenous Q12H  . spironolactone  25 mg Oral Daily  . sulfamethoxazole-trimethoprim  1 tablet Oral Q12H   Continuous Infusions: . sodium chloride 1,000 mL (11/17/19 0230)     LOS: 13 days    Time spent: 28 min    Sharen Hones, MD [facility]   To contact the attending provider between 7A-7P or the covering provider during after hours 7P-7A, please  log into the web site [email or web address] and access using universal [place] password for that web site. If you do not have the password, please call the hospital operator.  11/24/2019, 2:00 PM

## 2019-11-24 NOTE — Telephone Encounter (Signed)
Done.. 11/24/19 MyChart Visit has been cx as requested.Marland Kitchen

## 2019-11-24 NOTE — Progress Notes (Signed)
Physical Therapy Treatment Patient Details Name: [name]. MRN: [id number] DOB: 11-Nov-1955 Today's Date: 11/24/2019    History of Present Illness Pt admitted for sepsis secondary to wound infection and cellulitis. Pt is now s/p I&D of R residual limb on 11/12/19. 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 Other PMH includes expressive aphasia s/p L PCA CVA, Afib, and GERD.    PT Comments    Pt was asleep in supine upon arriving. He easily awakes and is very pleasant this date. Pt's overall cognition much improved from previous session observed. He reports wanting to get OOB and eat breakfast. Pt does complain of pain throughout session but does not yell out or moan with movements. He progressed from supine to sitting with mod-max assist + increased time and Vcs. He used bed rails to assist with transition + RUE HHA from therapist. Draw pad used to slide hips to EOB so pt could maintain sitting balance safely. Less severe L lateral lean however continues to be present. He stood with BUE support + therapist Blocking L knee for safety. Required max assist of one with gait belt used for safety. Stood ~ 10 sec prior to requesting seated rest. Total assist to safely pivot to recliner with max vcs for technique. Overall pt tolerated session much better than he has in past. Per NP, wound vac will be changed later this morning. Therapist will return to assist pt with returning to bed from recliner for safety. PT continues to recommend +2 assist for safety with all mobility. Pt was in recliner with chair alarm in place, call bell in reach, and spouse at bedside. He tolerated session well but was limited by fatigue/pain. He will benefit from SNF at DC to address strength, balance, and overall safe functional mobility deficits.    Follow Up Recommendations  SNF     Equipment Recommendations  Other (comment)(defer to next level of care)    Recommendations for Other  Services       Precautions / Restrictions Precautions Precautions: Fall Restrictions Weight Bearing Restrictions: No    Mobility  Bed Mobility Overal bed mobility: Needs Assistance Bed Mobility: Supine to Sit     Supine to sit: HOB elevated;Max assist;Mod assist     General bed mobility comments: mod/max of one + increased time and cueing. He demonstrated much improved abilities this date with c/o pain but not yelling out or guarding as much as previously observed.  Transfers Overall transfer level: Needs assistance Equipment used: None(pt BUE support on therapist) Transfers: Sit to/from Stand;Stand Pivot Transfers Sit to Stand: Max assist;From elevated surface Stand pivot transfers: Max assist;Total assist;From elevated surface       General transfer comment: Pt was able to stand 1 x EOB with therapist blocking L knee + max assist. Pt stood ~ 10 sec prior to requiring seated rest. He tolerated well but was fatigued. Pt stood pivot to recliner with max assist to stand but total assist to safely pivot. Pt has BUE support on therapist with max vcs for fwd wt shift and hand placement  Ambulation/Gait             General Gait Details: unsafe to progress at this time.   Stairs             Wheelchair Mobility    Modified Rankin (Stroke Patients Only)       Balance Overall balance assessment: Needs assistance Sitting-balance support: Bilateral upper extremity supported;Feet supported  Sitting balance-Leahy Scale: Fair Sitting balance - Comments: pt required in creased time to achieve position to be able to maintain balance. He was able to sit upright with less severe L lateral lean. he required constant CGA with several occasions of min-mod to prevent LOB L/posteriorly   Standing balance support: Bilateral upper extremity supported Standing balance-Leahy Scale: Poor                              Cognition Arousal/Alertness: Awake/alert Behavior  During Therapy: WFL for tasks assessed/performed Overall Cognitive Status: Impaired/Different from baseline Area of Impairment: Orientation;Following commands;Safety/judgement                 Orientation Level: Disoriented to;Time;Situation     Following Commands: Follows one step commands consistently Safety/Judgement: Decreased awareness of safety;Decreased awareness of deficits     General Comments: Pt much more alert and oriented however continues to be impaired. He was able to follow commands well and veryt cooperative.       Exercises      General Comments        Pertinent Vitals/Pain Pain Assessment: 0-10 Pain Score: (unable to rate) Pain Location: R knee/scrotum Pain Descriptors / Indicators: Discomfort;Guarding;Grimacing;Moaning Pain Intervention(s): Limited activity within patient's tolerance;Monitored during session;Repositioned    Home Living                      Prior Function            PT Goals (current goals can now be found in the care plan section) Acute Rehab PT Goals Patient Stated Goal: to get stronger Progress towards PT goals: Progressing toward goals    Frequency    Min 2X/week      PT Plan Current plan remains appropriate    Co-evaluation     PT goals addressed during session: Mobility/safety with mobility        AM-PAC PT "6 Clicks" Mobility   Outcome Measure  Help needed turning from your back to your side while in a flat bed without using bedrails?: A Lot Help needed moving from lying on your back to sitting on the side of a flat bed without using bedrails?: A Lot Help needed moving to and from a bed to a chair (including a wheelchair)?: A Lot Help needed standing up from a chair using your arms (e.g., wheelchair or bedside chair)?: A Lot Help needed to walk in hospital room?: Total Help needed climbing 3-5 steps with a railing? : Total 6 Click Score: 10    End of Session Equipment Utilized During  Treatment: Gait belt Activity Tolerance: Patient limited by fatigue;Patient tolerated treatment well Patient left: in chair;with call bell/phone within reach;with chair alarm set;with family/visitor present Nurse Communication: Mobility status PT Visit Diagnosis: Difficulty in walking, not elsewhere classified (R26.2);Other abnormalities of gait and mobility (R26.89);Muscle weakness (generalized) (M62.81)     Time: [id number] PT Time Calculation (min) (ACUTE ONLY): 29 min  Charges:  $Therapeutic Activity: 23-37 mins                     Julaine Fusi PTA 11/24/19, 10:13 AM

## 2019-11-24 NOTE — Telephone Encounter (Signed)
FYI

## 2019-11-24 NOTE — Consult Note (Signed)
WOC Nurse Consult Note: Reason for Consult: R AKA VAC replacement dressing.  Patient is premedicated prior to procedure Wound type:surgical infectious Pressure Injury POA: NA Measurement: 15 cm x 8.2 cm x 4 cm  Wound bed: Ruddy red, bleeding with 5% black tissue Drainage (amount, consistency, odor) moderate bloody drainage. Periwound:Edema to right residual limb and scrotum.  PA at bedside  Dressing procedure/placement/frequency: Dressing is replaced and bridged to right dorsal thigh.  1 cm round area of skin breakdown from medical adhesive related skin injury.  Wife states this happened while she was changing dressing.  WIll implement mupirocin ointment and dry dressing.  [place] team will follow.  Domenic Moras MSN, RN, FNP-BC CWON Wound, Ostomy, Continence Nurse Pager [phone]

## 2019-11-25 DIAGNOSIS — K7031 Alcoholic cirrhosis of liver with ascites: Secondary | ICD-10-CM | POA: Diagnosis not present

## 2019-11-25 DIAGNOSIS — A419 Sepsis, unspecified organism: Secondary | ICD-10-CM | POA: Diagnosis not present

## 2019-11-25 DIAGNOSIS — L02419 Cutaneous abscess of limb, unspecified: Secondary | ICD-10-CM

## 2019-11-25 LAB — CBC WITH DIFFERENTIAL/PLATELET
Abs Immature Granulocytes: 0.03 10*3/uL (ref 0.00–0.07)
Basophils Absolute: 0.1 10*3/uL (ref 0.0–0.1)
Basophils Relative: 1 %
Eosinophils Absolute: 0.3 10*3/uL (ref 0.0–0.5)
Eosinophils Relative: 4 %
HCT: 26.1 % — ABNORMAL LOW (ref 39.0–52.0)
Hemoglobin: 7.8 g/dL — ABNORMAL LOW (ref 13.0–17.0)
Immature Granulocytes: 1 %
Lymphocytes Relative: 20 %
Lymphs Abs: 1.2 10*3/uL (ref 0.7–4.0)
MCH: 25.6 pg — ABNORMAL LOW (ref 26.0–34.0)
MCHC: 29.9 g/dL — ABNORMAL LOW (ref 30.0–36.0)
MCV: 85.6 fL (ref 80.0–100.0)
Monocytes Absolute: 0.5 10*3/uL (ref 0.1–1.0)
Monocytes Relative: 7 %
Neutro Abs: 4.2 10*3/uL (ref 1.7–7.7)
Neutrophils Relative %: 67 %
Platelets: 103 10*3/uL — ABNORMAL LOW (ref 150–400)
RBC: 3.05 MIL/uL — ABNORMAL LOW (ref 4.22–5.81)
RDW: 19.7 % — ABNORMAL HIGH (ref 11.5–15.5)
WBC: 6.2 10*3/uL (ref 4.0–10.5)
nRBC: 0 % (ref 0.0–0.2)

## 2019-11-25 LAB — GLUCOSE, CAPILLARY
Glucose-Capillary: 108 mg/dL — ABNORMAL HIGH (ref 70–99)
Glucose-Capillary: 144 mg/dL — ABNORMAL HIGH (ref 70–99)
Glucose-Capillary: 133 mg/dL — ABNORMAL HIGH (ref 70–99)
Glucose-Capillary: 152 mg/dL — ABNORMAL HIGH (ref 70–99)

## 2019-11-25 LAB — BASIC METABOLIC PANEL
Anion gap: 10 (ref 5–15)
BUN: 17 mg/dL (ref 8–23)
CO2: 23 mmol/L (ref 22–32)
Calcium: 8.6 mg/dL — ABNORMAL LOW (ref 8.9–10.3)
Chloride: 102 mmol/L (ref 98–111)
Creatinine, Ser: 0.64 mg/dL (ref 0.61–1.24)
GFR calc Af Amer: >60 mL/min (ref >60)
GFR calc non Af Amer: >60 mL/min (ref >60)
Glucose, Bld: 111 mg/dL — ABNORMAL HIGH (ref 70–99)
Potassium: 3.8 mmol/L (ref 3.5–5.1)
Sodium: 135 mmol/L (ref 135–145)

## 2019-11-25 NOTE — Progress Notes (Signed)
PROGRESS NOTE    Glenn B Tutson Jr.  VVK:[id number] DOB: October 20, 1955 DOA: 11/10/2019 PCP: [name], DO (Confirm with patient/family/NH records and if not entered, this HAS to be entered at [facility] point of entry. "No PCP" if truly none.)   Brief Narrative: (Start on day 1 of progress note - keep it brief and live) 64 year old male with history of CVA, type 2 diabetes, memory loss, liver cirrhosis with history of alcohol abuse, peripheral vascular disease, who was admitted to the hospital for right above-knee amputation stump abscess. He also was septic at time of admission. He has been seen by infectious disease, I&D was performed, the wound grew MRSA. He has completed IV Unasyn and Zyvox, currently, taking Augmentin and Zyvox orally untilApril 30. Due to significant weakness, disability, patient will be placed in a nursing home rehab. Pending insurance preauthorization.   Assessment & Plan:   Active Problems:   Sepsis ([place])   #1.  Sepsis secondary to right lower extremity stump infection and abscess. Status post I&D.  Condition had improved.  Currently on Bactrim and Augmentin, last dose 4/30.  #2.  Thrombocytopenia. Most like secondary to liver disease.  Platelets has been stable so far.  3.  Iron deficient anemia. Continue supplement  4.  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 Continue lower dose of Lasix and Aldactone.  Edema much better.  Abdominal distention is also better.  Renal function stable.  5.  Liver lesions. Negative AFP.  Follow-up as outpatient  6.  Permanent atrial fibrillation. Continue Eliquis.  7.  Type 2 diabetes. Continue sliding scale insulin.  8.  Confusion., Patient has not had confusion for the last 3 days.   DVT prophylaxis: Eliquis. Code Status: Full Family Communication: Plan discussed with the patient with wife presence. Disposition Plan:   Patient came from:                                                                                                                           Anticipated d/c place:  Barriers to d/c OR conditions which need to be met to effect a safe d/c:   Consultants:   ID  Procedures:     Antimicrobials: Bactrim and Augmentin.     Subjective: Patient feels well today.  No confusion.  Leg edema much better today.  Has some soft stools, but no diarrhea.  No fever or chills.  Good appetite.  No nausea vomiting or shortness of breath.  Objective: Vitals:   11/24/19 2109 11/24/19 2258 11/25/19 0755 11/25/19 1553  BP: 127/80 107/73 103/70 124/73  Pulse: (!) 103 98 80 (!) 101  Resp:  18  18  Temp:  99.3 F (37.4 C) 98.4 F (36.9 C) 98.8 F (37.1 C)  TempSrc:  Oral Oral Oral  SpO2:  97% 96% 99%  Weight:      Height:       No intake or output data in the 24 hours  ending 11/25/19 1558 Filed Weights   11/19/19 0417 11/20/19 0129 11/21/19 0543  Weight: 93 kg 93.4 kg 96.8 kg    Examination:  General exam: Appears calm and comfortable  Respiratory system: Clear to auscultation. Respiratory effort normal. Cardiovascular system: S1 & S2 heard, RRR. No JVD, murmurs, rubs, gallops or clicks. 1+ pedal edema. Gastrointestinal system: Abdomen is distended, soft and nontender. No organomegaly or masses felt. Normal bowel sounds heard. Central nervous system: Alert and oriented. No focal neurological deficits. Extremities: Symmetric 5 x 5 power. Skin: No rashes, lesions or ulcers Psychiatry: Judgement and insight appear normal. Mood & affect appropriate.     Data Reviewed: I have personally reviewed following labs and imaging studies  CBC: Recent Labs  Lab 11/19/19 1238 11/21/19 0608 11/23/19 0522 11/24/19 0332 11/25/19 0458  WBC 10.7* 8.5 7.7 7.7 6.2  NEUTROABS  --   --   --  5.8 4.2  HGB 9.1* 8.5* 7.8* 7.9* 7.8*  HCT 31.0* 27.8* 26.0* 25.6* 26.1*  MCV 86.6 83.0 85.0 83.1 85.6  PLT 120* 113* 104* 101* 101*   Basic Metabolic  Panel: Recent Labs  Lab 11/19/19 0337 11/23/19 0522 11/24/19 0332 11/25/19 0458  NA 135 136 137 135  K 3.9 3.7 3.7 3.8  CL 101 102 102 102  CO2 28 25 25 23   GLUCOSE 140* 136* 133* 111*  BUN 12 13 18 17   CREATININE 0.46* 0.69 0.67 0.64  CALCIUM 8.5* 8.5* 8.6* 8.6*  MG  --   --  1.9  --    GFR: Estimated Creatinine Clearance: 105.6 mL/min (by C-G formula based on SCr of 0.64 mg/dL). Liver Function Tests: No results for input(s): AST, ALT, ALKPHOS, BILITOT, PROT, ALBUMIN in the last 168 hours. No results for input(s): LIPASE, AMYLASE in the last 168 hours. No results for input(s): AMMONIA in the last 168 hours. Coagulation Profile: No results for input(s): INR, PROTIME in the last 168 hours. Cardiac Enzymes: No results for input(s): CKTOTAL, CKMB, CKMBINDEX, TROPONINI in the last 168 hours. BNP (last 3 results) No results for input(s): PROBNP in the last 8760 hours. HbA1C: No results for input(s): HGBA1C in the last 72 hours. CBG: Recent Labs  Lab 11/24/19 1136 11/24/19 1652 11/24/19 2109 11/25/19 0824 11/25/19 1313  GLUCAP 179* 148* 142* 108* 144*   Lipid Profile: No results for input(s): CHOL, HDL, LDLCALC, TRIG, CHOLHDL, LDLDIRECT in the last 72 hours. Thyroid Function Tests: No results for input(s): TSH, T4TOTAL, FREET4, T3FREE, THYROIDAB in the last 72 hours. Anemia Panel: No results for input(s): VITAMINB12, FOLATE, FERRITIN, TIBC, IRON, RETICCTPCT in the last 72 hours. Sepsis Labs: No results for input(s): PROCALCITON, LATICACIDVEN in the last 168 hours.  Recent Results (from the past 240 hour(s))  MRSA PCR Screening     Status: Abnormal   Collection Time: 11/20/19  9:03 AM   Specimen: Nasal Mucosa; Nasopharyngeal  Result Value Ref Range Status   MRSA by PCR POSITIVE (A) NEGATIVE Final    Comment:        The GeneXpert MRSA Assay (FDA approved for NASAL specimens only), is one component of a comprehensive MRSA colonization surveillance program. It is  not intended to diagnose MRSA infection nor to guide or monitor treatment for MRSA infections. RESULT CALLED TO, READ BACK BY AND VERIFIED WITH: SERENITY [facility] RN AT [phone] ON 11/20/19 SNG  Performed at [facility], [address] [place], [place] [address]   Respiratory Panel by RT PCR (Flu A&B, Covid) - Nasopharyngeal Swab  Status: None   Collection Time: 11/20/19 11:46 AM   Specimen: Nasopharyngeal Swab  Result Value Ref Range Status   SARS Coronavirus 2 by RT PCR NEGATIVE NEGATIVE Final    Comment: (NOTE) SARS-CoV-2 target nucleic acids are NOT DETECTED. The SARS-CoV-2 RNA is generally detectable in upper respiratoy specimens during the acute phase of infection. The lowest concentration of SARS-CoV-2 viral copies this assay can detect is 131 copies/mL. A negative result does not preclude SARS-Cov-2 infection and should not be used as the sole basis for treatment or other patient management decisions. A negative result may occur with  improper specimen collection/handling, submission of specimen other than nasopharyngeal swab, presence of viral mutation(s) within the areas targeted by this assay, and inadequate number of viral copies (<131 copies/mL). A negative result must be combined with clinical observations, patient history, and epidemiological information. The expected result is Negative. Fact Sheet for Patients:  PinkCheek.be Fact Sheet for Healthcare Providers:  GravelBags.it This test is not yet ap proved or cleared by the [place] FDA and  has been authorized for detection and/or diagnosis of SARS-CoV-2 by FDA under an Emergency Use Authorization (EUA). This EUA will remain  in effect (meaning this test can be used) for the duration of the COVID-19 declaration under Section 564(b)(1) of the Act, 21 U.S.C. section 360bbb-3(b)(1), unless the authorization is terminated or revoked sooner.     Influenza A by PCR NEGATIVE NEGATIVE Final   Influenza B by PCR NEGATIVE NEGATIVE Final    Comment: (NOTE) The Xpert Xpress SARS-CoV-2/FLU/RSV assay is intended as an aid in  the diagnosis of influenza from Nasopharyngeal swab specimens and  should not be used as a sole basis for treatment. Nasal washings and  aspirates are unacceptable for Xpert Xpress SARS-CoV-2/FLU/RSV  testing. Fact Sheet for Patients: PinkCheek.be Fact Sheet for Healthcare Providers: GravelBags.it This test is not yet approved or cleared by the [place] FDA and  has been authorized for detection and/or diagnosis of SARS-CoV-2 by  FDA under an Emergency Use Authorization (EUA). This EUA will remain  in effect (meaning this test can be used) for the duration of the  Covid-19 declaration under Section 564(b)(1) of the Act, 21  U.S.C. section 360bbb-3(b)(1), unless the authorization is  terminated or revoked. Performed at [facility], [address], [place],  [address]          Radiology Studies: No results found.      Scheduled Meds: . acidophilus  1 capsule Oral Daily  . amoxicillin-clavulanate  1 tablet Oral Q12H  . apixaban  5 mg Oral BID  . vitamin C  500 mg Oral Daily  . Chlorhexidine Gluconate Cloth  6 each Topical Q0600  . cholecalciferol  5,000 Units Oral Daily  . docusate sodium  100 mg Oral Daily  . ferrous sulfate  325 mg Oral BID WC  . furosemide  20 mg Oral Daily  . gabapentin  1,200 mg Oral TID  . insulin aspart  0-15 Units Subcutaneous TID PC & HS  . metoprolol tartrate  50 mg Oral BID  . multivitamin with minerals  1 tablet Oral Daily  . mupirocin ointment   Topical Daily  . nutrition supplement (JUVEN)  1 packet Oral BID WC  . nystatin   Topical TID  . pantoprazole  20 mg Oral Daily  . Ensure Max Protein  11 oz Oral BID BM  . vitamin B-6  50 mg Oral Daily  . sodium chloride flush  10  mL  Intravenous Q12H  . spironolactone  25 mg Oral Daily  . sulfamethoxazole-trimethoprim  1 tablet Oral Q12H   Continuous Infusions: . sodium chloride 1,000 mL (11/17/19 0230)     LOS: 14 days    Time spent: 25 minutes    Sharen Hones, MD [facility]   To contact the attending provider between 7A-7P or the covering provider during after hours 7P-7A, please log into the web site [email or web address] and access using universal [place] password for that web site. If you do not have the password, please call the hospital operator.  11/25/2019, 3:58 PM

## 2019-11-26 DIAGNOSIS — L02415 Cutaneous abscess of right lower limb: Secondary | ICD-10-CM | POA: Diagnosis not present

## 2019-11-26 DIAGNOSIS — K7031 Alcoholic cirrhosis of liver with ascites: Secondary | ICD-10-CM | POA: Diagnosis not present

## 2019-11-26 DIAGNOSIS — A419 Sepsis, unspecified organism: Secondary | ICD-10-CM | POA: Diagnosis not present

## 2019-11-26 LAB — GLUCOSE, CAPILLARY
Glucose-Capillary: 153 mg/dL — ABNORMAL HIGH (ref 70–99)
Glucose-Capillary: 133 mg/dL — ABNORMAL HIGH (ref 70–99)
Glucose-Capillary: 152 mg/dL — ABNORMAL HIGH (ref 70–99)

## 2019-11-26 NOTE — Plan of Care (Signed)
  Problem: Education: Goal: Knowledge of General Education information will improve Description: Including pain rating scale, medication(s)/side effects and non-pharmacologic comfort measures Outcome: Progressing   Problem: Clinical Measurements: Goal: Ability to maintain clinical measurements within normal limits will improve Outcome: Progressing Goal: Will remain free from infection Outcome: Progressing Goal: Diagnostic test results will improve Outcome: Progressing Goal: Respiratory complications will improve Outcome: Progressing Goal: Cardiovascular complication will be avoided Outcome: Progressing   Problem: Coping: Goal: Level of anxiety will decrease Outcome: Progressing   Problem: Pain Managment: Goal: General experience of comfort will improve Outcome: Progressing   

## 2019-11-26 NOTE — Progress Notes (Signed)
PROGRESS NOTE    Glenn B Glaude Jr.  WIO:[id number] DOB: 08/17/1955 DOA: 11/10/2019 PCP: [name], DO (Confirm with patient/family/NH records and if not entered, this HAS to be entered at [facility] point of entry. "No PCP" if truly none.)   Brief Narrative: (Start on day 1 of progress note - keep it brief and live)  64 year old male with history of CVA, type 2 diabetes, memory loss, liver cirrhosis with history of alcohol abuse, peripheral vascular disease, who was admitted to the hospital for right above-knee amputation stump abscess. He also was septic at time of admission. He has been seen by infectious disease, I&D was performed, the wound grew MRSA. He has completed IV Unasyn and Zyvox, currently, taking Augmentin and Zyvox orally untilApril 30. Due to concerns of thrombocytopenia, antibiotic was switched to Augmentin and Bactrim. Due to significant weakness, disability, patient will be placed in a nursing home rehab. Pending insurance preauthorization.   Assessment & Plan:   Active Problems:   Sepsis ([place])  #1.  Sepsis secondary to right lower extremity stump infection and abscess. Status post I&D.  Currently, patient had a wound VAC.  But her condition much improved.  Bactrim and Augmentin is continued until 4/30.  #2.  Thrombocytopenia. Most likely due to liver disease.  Condition has been stable.  Recheck a level tomorrow anticipating transfer to nursing home tomorrow.  3.  Iron deficient anemia. Continue supplement.  4.  Liver cirrhosis secondary to hepatitis B and alcohol with ascites Continue Aldactone and Lasix.  Edema and ascites are getting better.  Renal function still stable.  5.  Liver lesions. Negative AFP.  Outpatient follow-up.  6.  Permanent atrial fibrillation. Eliquis will be continued.  7.  Type 2 diabetes. Continue sliding scale insulin.  8.  Confusion. Patient has significant confusion at time of admission.  Condition has resolved for the  least to 4 days.  DVT prophylaxis:Eliquis. Code Status:Full Family Communication:Plan discussed with the patient with wife presence. Disposition Plan:  Patient came from:  Anticipated d/c place: ECF  Barriers to d/c OR conditions which need to be met to effect a safe d/c: Biochemist, clinical.  Consultants:  ID  Procedures:    Antimicrobials:Bactrim and Augmentin.  Subjective: Patient feels well today.  He was able to transfer to the bedside commode.  He denies any nausea vomiting or diarrhea. His leg edema is getting better.  Abdominal distention is also getting better. Denies any short of breath or cough.  Objective: Vitals:   11/25/19 1553 11/25/19 2207 11/25/19 2350 11/26/19 0818  BP: 124/73 118/87 122/72 107/78  Pulse: (!) 101 95 (!) 108 89  Resp: _0 Temp: 98.8 F (37.1 C) 98.7 F (37.1 C) 98.9 F (37.2 C) 98.7 F (37.1 C)  TempSrc: Oral Oral Oral Oral  SpO2: 99% 97% 97% 93%  Weight:      Height:        Intake/Output Summary (Last 24 hours) at 11/26/2019 1003 Last data filed at 11/26/2019 0620 Gross per 24 hour  Intake --  Output 10 ml  Net -10 ml   Filed Weights   11/19/19 0417 11/20/19 0129 11/21/19 0543  Weight: 93 kg 93.4 kg 96.8 kg    Examination:  General exam: Appears calm and comfortable  Respiratory system: Clear to auscultation. Respiratory effort normal. Cardiovascular system: Irregular.Marland Kitchen No JVD, murmurs, rubs, gallops or clicks. 2+pedal edema. Gastrointestinal system: Abdomen is mildly distended, soft and nontender. No organomegaly or masses felt. Normal bowel  sounds heard. Central nervous system: Alert and oriented. No focal neurological deficits. Extremities: Symmetric, stump wound vac in place. Skin: No rashes, lesions or ulcers Psychiatry: Judgement and insight appear normal. Mood & affect  appropriate.     Data Reviewed: I have personally reviewed following labs and imaging studies  CBC: Recent Labs  Lab 11/19/19 1238 11/21/19 0608 11/23/19 0522 11/24/19 0332 11/25/19 0458  WBC 10.7* 8.5 7.7 7.7 6.2  NEUTROABS  --   --   --  5.8 4.2  HGB 9.1* 8.5* 7.8* 7.9* 7.8*  HCT 31.0* 27.8* 26.0* 25.6* 26.1*  MCV 86.6 83.0 85.0 83.1 85.6  PLT 120* 113* 104* 101* 637*   Basic Metabolic Panel: Recent Labs  Lab 11/23/19 0522 11/24/19 0332 11/25/19 0458  NA 136 137 135  K 3.7 3.7 3.8  CL 102 102 102  CO2 _0 GLUCOSE 136* 133* 111*  BUN _1 CREATININE 0.69 0.67 0.64  CALCIUM 8.5* 8.6* 8.6*  MG  --  1.9  --    GFR: Estimated Creatinine Clearance: 105.6 mL/min (by C-G formula based on SCr of 0.64 mg/dL). Liver Function Tests: No results for input(s): AST, ALT, ALKPHOS, BILITOT, PROT, ALBUMIN in the last 168 hours. No results for input(s): LIPASE, AMYLASE in the last 168 hours. No results for input(s): AMMONIA in the last 168 hours. Coagulation Profile: No results for input(s): INR, PROTIME in the last 168 hours. Cardiac Enzymes: No results for input(s): CKTOTAL, CKMB, CKMBINDEX, TROPONINI in the last 168 hours. BNP (last 3 results) No results for input(s): PROBNP in the last 8760 hours. HbA1C: No results for input(s): HGBA1C in the last 72 hours. CBG: Recent Labs  Lab 11/24/19 2109 11/25/19 0824 11/25/19 1313 11/25/19 1644 11/25/19 2105  GLUCAP 142* 108* 144* 133* 152*   Lipid Profile: No results for input(s): CHOL, HDL, LDLCALC, TRIG, CHOLHDL, LDLDIRECT in the last 72 hours. Thyroid Function Tests: No results for input(s): TSH, T4TOTAL, FREET4, T3FREE, THYROIDAB in the last 72 hours. Anemia Panel: No results for input(s): VITAMINB12, FOLATE, FERRITIN, TIBC, IRON, RETICCTPCT in the last 72 hours. Sepsis Labs: No results for input(s): PROCALCITON, LATICACIDVEN in the last 168 hours.  Recent Results (from the past 240 hour(s))  MRSA PCR  Screening     Status: Abnormal   Collection Time: 11/20/19  9:03 AM   Specimen: Nasal Mucosa; Nasopharyngeal  Result Value Ref Range Status   MRSA by PCR POSITIVE (A) NEGATIVE Final    Comment:        The GeneXpert MRSA Assay (FDA approved for NASAL specimens only), is one component of a comprehensive MRSA colonization surveillance program. It is not intended to diagnose MRSA infection nor to guide or monitor treatment for MRSA infections. RESULT CALLED TO, READ BACK BY AND VERIFIED WITH: SERENITY [facility] RN AT [phone] ON 11/20/19 SNG  Performed at [facility], [address] [place], [place] [address]   Respiratory Panel by RT PCR (Flu A&B, Covid) - Nasopharyngeal Swab     Status: None   Collection Time: 11/20/19 11:46 AM   Specimen: Nasopharyngeal Swab  Result Value Ref Range Status   SARS Coronavirus 2 by RT PCR NEGATIVE NEGATIVE Final    Comment: (NOTE) SARS-CoV-2 target nucleic acids are NOT DETECTED. The SARS-CoV-2 RNA is generally detectable in upper respiratoy specimens during the acute phase of infection. The lowest concentration of SARS-CoV-2 viral copies this assay can detect is 131 copies/mL. A negative result does not preclude SARS-Cov-2 infection  and should not be used as the sole basis for treatment or other patient management decisions. A negative result may occur with  improper specimen collection/handling, submission of specimen other than nasopharyngeal swab, presence of viral mutation(s) within the areas targeted by this assay, and inadequate number of viral copies (<131 copies/mL). A negative result must be combined with clinical observations, patient history, and epidemiological information. The expected result is Negative. Fact Sheet for Patients:  PinkCheek.be Fact Sheet for Healthcare Providers:  GravelBags.it This test is not yet ap proved or cleared by the [place] FDA and   has been authorized for detection and/or diagnosis of SARS-CoV-2 by FDA under an Emergency Use Authorization (EUA). This EUA will remain  in effect (meaning this test can be used) for the duration of the COVID-19 declaration under Section 564(b)(1) of the Act, 21 U.S.C. section 360bbb-3(b)(1), unless the authorization is terminated or revoked sooner.    Influenza A by PCR NEGATIVE NEGATIVE Final   Influenza B by PCR NEGATIVE NEGATIVE Final    Comment: (NOTE) The Xpert Xpress SARS-CoV-2/FLU/RSV assay is intended as an aid in  the diagnosis of influenza from Nasopharyngeal swab specimens and  should not be used as a sole basis for treatment. Nasal washings and  aspirates are unacceptable for Xpert Xpress SARS-CoV-2/FLU/RSV  testing. Fact Sheet for Patients: PinkCheek.be Fact Sheet for Healthcare Providers: GravelBags.it This test is not yet approved or cleared by the [place] FDA and  has been authorized for detection and/or diagnosis of SARS-CoV-2 by  FDA under an Emergency Use Authorization (EUA). This EUA will remain  in effect (meaning this test can be used) for the duration of the  Covid-19 declaration under Section 564(b)(1) of the Act, 21  U.S.C. section 360bbb-3(b)(1), unless the authorization is  terminated or revoked. Performed at [facility], [address] [place], [place] [address]          Radiology Studies: No results found.      Scheduled Meds: . acidophilus  1 capsule Oral Daily  . amoxicillin-clavulanate  1 tablet Oral Q12H  . apixaban  5 mg Oral BID  . vitamin C  500 mg Oral Daily  . Chlorhexidine Gluconate Cloth  6 each Topical Q0600  . cholecalciferol  5,000 Units Oral Daily  . docusate sodium  100 mg Oral Daily  . ferrous sulfate  325 mg Oral BID WC  . furosemide  20 mg Oral Daily  . gabapentin  1,200 mg Oral TID  . insulin aspart  0-15 Units Subcutaneous TID PC & HS   . metoprolol tartrate  50 mg Oral BID  . multivitamin with minerals  1 tablet Oral Daily  . mupirocin ointment   Topical Daily  . nutrition supplement (JUVEN)  1 packet Oral BID WC  . nystatin   Topical TID  . pantoprazole  20 mg Oral Daily  . Ensure Max Protein  11 oz Oral BID BM  . vitamin B-6  50 mg Oral Daily  . sodium chloride flush  10 mL Intravenous Q12H  . spironolactone  25 mg Oral Daily  . sulfamethoxazole-trimethoprim  1 tablet Oral Q12H   Continuous Infusions: . sodium chloride 1,000 mL (11/17/19 0230)     LOS: 15 days    Time spent: 25 minutes    Sharen Hones, MD [facility]   To contact the attending provider between 7A-7P or the covering provider during after hours 7P-7A, please log into the web site [email or web address] and access using universal  [place] password for that web site. If you do not have the password, please call the hospital operator.  11/26/2019, 10:03 AM

## 2019-11-27 DIAGNOSIS — B1920 Unspecified viral hepatitis C without hepatic coma: Secondary | ICD-10-CM

## 2019-11-27 DIAGNOSIS — K7031 Alcoholic cirrhosis of liver with ascites: Secondary | ICD-10-CM

## 2019-11-27 DIAGNOSIS — D696 Thrombocytopenia, unspecified: Secondary | ICD-10-CM

## 2019-11-27 DIAGNOSIS — L02415 Cutaneous abscess of right lower limb: Secondary | ICD-10-CM

## 2019-11-27 DIAGNOSIS — G9341 Metabolic encephalopathy: Secondary | ICD-10-CM

## 2019-11-27 DIAGNOSIS — K769 Liver disease, unspecified: Secondary | ICD-10-CM

## 2019-11-27 LAB — GLUCOSE, CAPILLARY
Glucose-Capillary: 110 mg/dL — ABNORMAL HIGH (ref 70–99)
Glucose-Capillary: 135 mg/dL — ABNORMAL HIGH (ref 70–99)
Glucose-Capillary: 139 mg/dL — ABNORMAL HIGH (ref 70–99)
Glucose-Capillary: 133 mg/dL — ABNORMAL HIGH (ref 70–99)
Glucose-Capillary: 151 mg/dL — ABNORMAL HIGH (ref 70–99)

## 2019-11-27 LAB — CBC WITH DIFFERENTIAL/PLATELET
Abs Immature Granulocytes: 0.05 10*3/uL (ref 0.00–0.07)
Basophils Absolute: 0.1 10*3/uL (ref 0.0–0.1)
Basophils Relative: 1 %
Eosinophils Absolute: 0.4 10*3/uL (ref 0.0–0.5)
Eosinophils Relative: 5 %
HCT: 28.5 % — ABNORMAL LOW (ref 39.0–52.0)
Hemoglobin: 8.4 g/dL — ABNORMAL LOW (ref 13.0–17.0)
Immature Granulocytes: 1 %
Lymphocytes Relative: 17 %
Lymphs Abs: 1.2 10*3/uL (ref 0.7–4.0)
MCH: 25.4 pg — ABNORMAL LOW (ref 26.0–34.0)
MCHC: 29.5 g/dL — ABNORMAL LOW (ref 30.0–36.0)
MCV: 86.1 fL (ref 80.0–100.0)
Monocytes Absolute: 0.7 10*3/uL (ref 0.1–1.0)
Monocytes Relative: 10 %
Neutro Abs: 4.7 10*3/uL (ref 1.7–7.7)
Neutrophils Relative %: 66 %
Platelets: 148 10*3/uL — ABNORMAL LOW (ref 150–400)
RBC: 3.31 MIL/uL — ABNORMAL LOW (ref 4.22–5.81)
RDW: 19.9 % — ABNORMAL HIGH (ref 11.5–15.5)
WBC: 7.1 10*3/uL (ref 4.0–10.5)
nRBC: 0.3 % — ABNORMAL HIGH (ref 0.0–0.2)

## 2019-11-27 LAB — MAGNESIUM: Magnesium: 1.8 mg/dL (ref 1.7–2.4)

## 2019-11-27 NOTE — Plan of Care (Signed)
  Problem: Education: Goal: Knowledge of General Education information will improve Description: Including pain rating scale, medication(s)/side effects and non-pharmacologic comfort measures Outcome: Progressing   Problem: Clinical Measurements: Goal: Ability to maintain clinical measurements within normal limits will improve Outcome: Progressing Goal: Will remain free from infection Outcome: Progressing Goal: Diagnostic test results will improve Outcome: Progressing Goal: Respiratory complications will improve Outcome: Progressing Goal: Cardiovascular complication will be avoided Outcome: Progressing   Problem: Activity: Goal: Risk for activity intolerance will decrease Outcome: Progressing   Problem: Pain Managment: Goal: General experience of comfort will improve Outcome: Progressing   Problem: Safety: Goal: Ability to remain free from injury will improve Outcome: Progressing   

## 2019-11-27 NOTE — Consult Note (Signed)
[place] Nurse wound follow up Wound type:Right AKA, s/p revision.  NPWT (VAC) dressing change.  Measurement:not assessed.  See last note  Wound [id number] red, friable Drainage (amount, consistency, odor) bloody drainage in canister. Moderate drainage.no odor.  Periwound:intact  MASD to right anterior thigh resolving, silicone foam in place.  Dressing procedure/placement/frequency: Change mon/W/F Will not follow at this time.  Please re-consult if needed.  Domenic Moras MSN, RN, FNP-BC [facility]ON Wound, Ostomy, Continence Nurse Pager [phone]

## 2019-11-27 NOTE — Progress Notes (Signed)
Physical Therapy Treatment Patient Details Name: [name]. MRN: [id number] DOB: [birth year] Today's Date: 11/27/2019    History of Present Illness Pt admitted for sepsis secondary to wound infection and cellulitis. Pt is now s/p I&D of R residual limb on 11/12/19. 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 Other PMH includes expressive aphasia s/p L PCA CVA, Afib, and GERD.    PT Comments    Pt alert, initially refusing PT but with MD, PT, and family encouragement, pt agreeable. Pt reported significant pain, RN notified at beginning of session. The patient was able to perform supine exercises, multimodal cueing throughout, redirection as needed. Pt unable to perform isometric hip extension, encouraged to continue to attempt during the day. Supine to sit with HOB elevated and heavy use of bed rails, modA, extended time to encourage pt participation and proper hand placement. Sit <> Stand with maxAx2 and RW performed twice. Second attempt pt with improved balance, and motivated to transfer to chair, able to take several small hops for a stand pivot transfer, modAx2 and total assistance for RW use. PT in chair, with all needs in reach at end of session. The patient would benefit from further skilled PT intervention to continue to progress towards goals. Recommendation remains appropriate.        Follow Up Recommendations  SNF     Equipment Recommendations  Other (comment)(defer to next level of care)    Recommendations for Other Services       Precautions / Restrictions Precautions Precautions: Fall Precaution Comments: R AKA, wound vac Restrictions Weight Bearing Restrictions: No RLE Weight Bearing: Non weight bearing Other Position/Activity Restrictions: R AKA    Mobility  Bed Mobility Overal bed mobility: Needs Assistance Bed Mobility: Supine to Sit     Supine to sit: Mod assist;HOB elevated     General bed mobility comments:  increased time for hand placement (tends to reach for PT), to encourage pt participation as much as possible, heavy use of UE to perform  Transfers Overall transfer level: Needs assistance Equipment used: Rolling walker (2 wheeled) Transfers: Sit to/from [facility] Sit to Stand: Max assist;+2 physical assistance Stand pivot transfers: Mod assist;+2 physical assistance       General transfer comment: sit to stand performed twice, first attempt maxAx2 and RW, pt with difficulty maintaining upright position. second attempt pt eager to transfer to chair, improved balance noted and pt able to take very small hops, modAx2 with RW (Heavy stabilization and assistance to move walker as well).  Ambulation/Gait                 Stairs             Wheelchair Mobility    Modified Rankin (Stroke Patients Only)       Balance Overall balance assessment: Needs assistance Sitting-balance support: Feet supported(L foot supported) Sitting balance-Leahy Scale: Fair Sitting balance - Comments: reliant on UE support, no lean noted this AM.     Standing balance-Leahy Scale: Poor                              Cognition Arousal/Alertness: Awake/alert Behavior During Therapy: WFL for tasks assessed/performed Overall Cognitive Status: Impaired/Different from baseline Area of Impairment: Orientation;Following commands;Safety/judgement                 Orientation Level: Disoriented to;Time;Situation     Following Commands: Follows  one step commands consistently Safety/Judgement: Decreased awareness of safety;Decreased awareness of deficits     General Comments: Pt much more alert and oriented however continues to be impaired. He was able to follow commands well and veryt cooperative.       Exercises Other Exercises Other Exercises: On residual limb, pt performed hip flexion, hip abduction, and hip adduction x10, multimodal cueing throughout. Pt  unable to perform hip extension isometric into bed or into PT hand. x10 heel slides and hip abduction on LLE as well AROM.    General Comments        Pertinent Vitals/Pain Pain Assessment: 0-10 Faces Pain Scale: Hurts whole lot Pain Location: R knee/scrotum Pain Descriptors / Indicators: Discomfort;Guarding;Grimacing;Moaning Pain Intervention(s): Limited activity within patient's tolerance;Monitored during session;Repositioned;Patient requesting pain meds-RN notified    Home Living                      Prior Function            PT Goals (current goals can now be found in the care plan section) Progress towards PT goals: Progressing toward goals    Frequency    Min 2X/week      PT Plan Current plan remains appropriate    Co-evaluation              AM-PAC PT "6 Clicks" Mobility   Outcome Measure  Help needed turning from your back to your side while in a flat bed without using bedrails?: A Lot Help needed moving from lying on your back to sitting on the side of a flat bed without using bedrails?: A Lot Help needed moving to and from a bed to a chair (including a wheelchair)?: A Lot Help needed standing up from a chair using your arms (e.g., wheelchair or bedside chair)?: A Lot Help needed to walk in hospital room?: A Lot Help needed climbing 3-5 steps with a railing? : Total 6 Click Score: 11    End of Session Equipment Utilized During Treatment: Gait belt Activity Tolerance: Patient tolerated treatment well;Patient limited by pain Patient left: with chair alarm set;in chair;with call bell/phone within reach;with nursing/sitter in room Nurse Communication: Mobility status PT Visit Diagnosis: Difficulty in walking, not elsewhere classified (R26.2);Other abnormalities of gait and mobility (R26.89);Muscle weakness (generalized) (M62.81)     Time: [id number] PT Time Calculation (min) (ACUTE ONLY): 32 min  Charges:  $Therapeutic Exercise: 23-37  mins                     Lieutenant Diego PT, DPT 1:05 PM,11/27/19

## 2019-11-27 NOTE — Progress Notes (Signed)
Physical Therapy Treatment Patient Details Name: [name]. MRN: [id number] DOB: Apr 13, 1956 Today's Date: 11/27/2019    History of Present Illness Pt admitted for sepsis secondary to wound infection and cellulitis. Pt is now s/p I&D of R residual limb on 11/12/19. 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 Other PMH includes expressive aphasia s/p L PCA CVA, Afib, and GERD.    PT Comments    Pt in chair with RNs at bedside, requesting to return to bed. Several sit <> Stands attempted with RNs, unable to come fully to standing, last sit <> Stand to allow buttocks to clear to place bedpan, during preparation to attempt lateral scoot transfer, PT tech entered room, reattempted sit <> Stand maxAx2, able to take very small hops/pivots to return to bed. Pt returned to supine maxAx2 and respositioned. Pt in bed, all needs in reach, RN notified of pt requesting pain medication. The patient would benefit from further skilled PT intervention to continue to progress towards goals. Recommendation remains appropriate.     Follow Up Recommendations  SNF     Equipment Recommendations  Other (comment)(TBD at next venue of care)    Recommendations for Other Services       Precautions / Restrictions Precautions Precautions: Fall Precaution Comments: R AKA, wound vac Restrictions Weight Bearing Restrictions: No Other Position/Activity Restrictions: R AKA    Mobility  Bed Mobility Overal bed mobility: Needs Assistance Bed Mobility: Sit to Supine     Supine to sit: Max assist;+2 for physical assistance     General bed mobility comments: increased time for hand placement (tends to reach for PT), to encourage pt participation as much as possible, heavy use of UE to perform  Transfers Overall transfer level: Needs assistance Equipment used: Rolling walker (2 wheeled) Transfers: Sit to/from [facility] Sit to Stand: Max assist;+2 physical  assistance Stand pivot transfers: Max assist;+2 physical assistance       General transfer comment: sit to stand attempted with RNs several times, unable to come fully to standing. 3rd attempt with RN to allow pt to sit on bedpan for BM. Readying for lateral scoot transfer, PT tech entered room, transitioned to stand pivot transfer, increased difficulty noted compared to AM session.  Ambulation/Gait                 Stairs             Wheelchair Mobility    Modified Rankin (Stroke Patients Only)       Balance Overall balance assessment: Needs assistance Sitting-balance support: Feet supported(L foot supported) Sitting balance-Leahy Scale: Fair Sitting balance - Comments: Pt able to scoot anteriorly in recliner in prep for transfers several times this session. fair sitting balance noted.     Standing balance-Leahy Scale: Poor                              Cognition Arousal/Alertness: Awake/alert Behavior During Therapy: WFL for tasks assessed/performed Overall Cognitive Status: Impaired/Different from baseline Area of Impairment: Orientation;Following commands;Safety/judgement                 Orientation Level: Disoriented to;Time;Situation     Following Commands: Follows one step commands consistently Safety/Judgement: Decreased awareness of safety;Decreased awareness of deficits     General Comments: Pt much more alert and oriented however continues to be impaired. He was able to follow commands well and veryt cooperative.  Exercises Other Exercises Other Exercises: On residual limb, pt performed hip flexion, hip abduction, and hip adduction x10, multimodal cueing throughout. Pt unable to perform hip extension isometric into bed or into PT hand. x10 heel slides and hip abduction on LLE as well AROM.    General Comments        Pertinent Vitals/Pain Pain Assessment: Faces Faces Pain Scale: Hurts worst Pain Location: R  knee/scrotum Pain Descriptors / Indicators: Discomfort;Guarding;Grimacing;Moaning Pain Intervention(s): Limited activity within patient's tolerance;Monitored during session;Repositioned;Patient requesting pain meds-RN notified    Home Living                      Prior Function            PT Goals (current goals can now be found in the care plan section) Progress towards PT goals: Progressing toward goals    Frequency    Min 2X/week      PT Plan Current plan remains appropriate    Co-evaluation              AM-PAC PT "6 Clicks" Mobility   Outcome Measure  Help needed turning from your back to your side while in a flat bed without using bedrails?: A Lot Help needed moving from lying on your back to sitting on the side of a flat bed without using bedrails?: A Lot Help needed moving to and from a bed to a chair (including a wheelchair)?: A Lot Help needed standing up from a chair using your arms (e.g., wheelchair or bedside chair)?: A Lot Help needed to walk in hospital room?: Total Help needed climbing 3-5 steps with a railing? : Total 6 Click Score: 10    End of Session Equipment Utilized During Treatment: Gait belt Activity Tolerance: Patient limited by fatigue;Patient limited by pain Patient left: in bed;with call bell/phone within reach;with nursing/sitter in room;with bed alarm set Nurse Communication: Mobility status PT Visit Diagnosis: Difficulty in walking, not elsewhere classified (R26.2);Other abnormalities of gait and mobility (R26.89);Muscle weakness (generalized) (M62.81)     Time: 1315-1330 PT Time Calculation (min) (ACUTE ONLY): 15 min  Charges:  $Therapeutic Exercise: 8-22 mins                     Lieutenant Diego PT, DPT 2:27 PM,11/27/19

## 2019-11-27 NOTE — TOC Progression Note (Signed)
Transition of Care (TOC) - Progression Note    Patient Details  Name: [name]. MRN: [id number] Date of Birth: 1955/09/09  Transition of Care [facility]) CM/SW Contact  Su Hilt, RN Phone Number: 11/27/2019, 10:03 AM  Clinical Narrative:    Called to follow up on insurance auth, they have not heard back from Marlin, she will follow up        Expected Discharge Plan and Services                                                 Social Determinants of Health (SDOH) Interventions    Readmission Risk Interventions No flowsheet data found.

## 2019-11-27 NOTE — Progress Notes (Signed)
[facility] PROGRESS NOTE    Progress Note  Glenn B Jonsson Jr.  [id number] DOB: 09-May-1956 DOA: 11/10/2019 PCP: [name], DO     Brief Narrative:   Glenn Gerold. is an 64 y.o. male 3 over CVA, uncontrolled diabetes mellitus type 2, memory loss liver cirrhosis in the setting of alcohol abuse, peripheral vascular disease who was admitted to the hospital for a right above-knee amputation stump abscess, found to be septic on admission.   Assessment/Plan:   Sepsis due to Abscess of right lower extremity: Status post I&D by vascular surgery on 11/11/2019. MRSA PCR was positive, anaerobic culture showed MRSA and Enterococcus faecalis on 11/11/2019. He has had multiple doses of IV antibiotics. Wound care has been consulted, they recommended wet-to-dry dressing with mupirocin ointment around the erythematous area. Awaiting insurance approval.  Acute metabolic encephalopathy: Likely due to infectious etiology, work-up has been negative encephalopathy has resolved.  Acute thrombocytopenia: In the setting of infectious etiology of liver disease is resolving his platelet count is rising.  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  Liver lesion: Alpha-fetoprotein protein negative, follow-up with PCP as an outpatient.  Chronic atrial fibrillation: Continue Eliquis.  Uncontrolled diabetes mellitus type 2 with peripheral artery disease ([name]) Last hemoglobin A1c on 10/26/2019 was 7.7 here in house he has been well regulated with just sliding scale, requiring only 9-5 units in a 24-hour period, at discharge can go to oral glycemic agents.  Iron deficiency anemia due to chronic blood loss   DVT prophylaxis: eliquis Family Communication:none Status is: Inpatient  Remains inpatient  appropriate because:Awaiting insurance approval.   Dispo: The patient is from: Home              Anticipated d/c is to: Home              Anticipated d/c date is: 2 days              Patient currently is not medically stable to d/c.    Code Status:     Code Status Orders  (From admission, onward)         Start     Ordered   11/11/19 0205  Full code  Continuous     11/11/19 0214        Code Status History    Date Active Date Inactive Code Status Order ID Comments User Context   10/26/2019 1600 10/27/2019 2106 Full Code [id number]  [name], MD Inpatient   08/28/2019 1159 08/28/2019 1908 Full Code [id number]  [name], MD Inpatient   06/02/2018 1708 06/06/2018 1843 Full Code [id number]  [name], MD Inpatient   01/13/2018 0950 01/13/2018 1435 Full Code [id number]  [name], MD Inpatient   06/21/2017 2120 06/23/2017 1642 Partial Code [id number]  Salary, [name], MD Inpatient   Advance Care Planning Activity    Advance Directive Documentation     Most Recent Value  Type of Advance Directive  Healthcare Power of Attorney, Living will  Pre-existing out of facility DNR order (yellow form or pink MOST form)  --  "MOST" Form in Place?  --        IV Access:    Peripheral IV   Procedures and diagnostic studies:   No results found.   Medical Consultants:    None.  Anti-Infectives:  Bactrim Augmentin  Subjective:    Glenn B Hardgrove Jr. patient is complaining that his leg hurts.  Objective:    Vitals:   11/26/19 0818 11/26/19 1713 11/26/19 2223 11/26/19 2347  BP: 107/78 132/86 121/83 118/69  Pulse: 89 96 89 87  Resp: 18 18  16   Temp: 98.7 F (37.1 C) 98.7 F (37.1 C)  98.7 F (37.1 C)  TempSrc: Oral Oral  Oral  SpO2: 93% 99%  100%  Weight:      Height:       SpO2: 100 % O2 Flow Rate (L/min): 3 L/min   Intake/Output Summary (Last 24 hours) at 11/27/2019 0717 Last data filed at 11/27/2019 0513 Gross per 24 hour  Intake --  Output 0 ml   Net 0 ml   Filed Weights   11/19/19 0417 11/20/19 0129 11/21/19 0543  Weight: 93 kg 93.4 kg 96.8 kg    Exam: General exam: In no acute distress. Respiratory system: Good air movement and clear to auscultation. Cardiovascular system: S1 & S2 heard, RRR.  Gastrointestinal system: Abdomen is nondistended, soft and nontender.  Central nervous system: Alert and oriented. No focal neurological deficits. Extremities: Right above-the-knee amputation with wound VAC in place. Skin: No rashes, lesions or ulcers  Data Reviewed:    Labs: Basic Metabolic Panel: Recent Labs  Lab 11/23/19 0522 11/23/19 0522 11/24/19 0332 11/25/19 0458 11/27/19 0442  NA 136  --  137 135  --   K 3.7   < > 3.7 3.8  --   CL 102  --  102 102  --   CO2 25  --  25 23  --   GLUCOSE 136*  --  133* 111*  --   BUN 13  --  18 17  --   CREATININE 0.69  --  0.67 0.64  --   CALCIUM 8.5*  --  8.6* 8.6*  --   MG  --   --  1.9  --  1.8   < > = values in this interval not displayed.   GFR Estimated Creatinine Clearance: 105.6 mL/min (by C-G formula based on SCr of 0.64 mg/dL). Liver Function Tests: No results for input(s): AST, ALT, ALKPHOS, BILITOT, PROT, ALBUMIN in the last 168 hours. No results for input(s): LIPASE, AMYLASE in the last 168 hours. No results for input(s): AMMONIA in the last 168 hours. Coagulation profile No results for input(s): INR, PROTIME in the last 168 hours. COVID-19 Labs  No results for input(s): DDIMER, FERRITIN, LDH, CRP in the last 72 hours.  Lab Results  Component Value Date   SARSCOV2NAA NEGATIVE 11/20/2019   [place] NEGATIVE 11/11/2019   [place] NEGATIVE 10/25/2019   [place] NEGATIVE 08/24/2019    CBC: Recent Labs  Lab 11/21/19 0608 11/23/19 0522 11/24/19 0332 11/25/19 0458 11/27/19 0442  WBC 8.5 7.7 7.7 6.2 7.1  NEUTROABS  --   --  5.8 4.2 4.7  HGB 8.5* 7.8* 7.9* 7.8* 8.4*  HCT 27.8* 26.0* 25.6* 26.1* 28.5*  MCV 83.0 85.0 83.1 85.6 86.1  PLT 113*  104* 101* 103* 148*   Cardiac Enzymes: No results for input(s): CKTOTAL, CKMB, CKMBINDEX, TROPONINI in the last 168 hours. BNP (last 3 results) No results for input(s): PROBNP in the last 8760 hours. CBG: Recent Labs  Lab 11/25/19 1644 11/25/19 2105 11/26/19 1147 11/26/19 1709 11/26/19 2143  GLUCAP 133* 152* 153* 133* 152*   D-Dimer: No results for input(s): DDIMER in the last 72 hours. Hgb A1c: No results for  input(s): HGBA1C in the last 72 hours. Lipid Profile: No results for input(s): CHOL, HDL, LDLCALC, TRIG, CHOLHDL, LDLDIRECT in the last 72 hours. Thyroid function studies: No results for input(s): TSH, T4TOTAL, T3FREE, THYROIDAB in the last 72 hours.  Invalid input(s): FREET3 Anemia work up: No results for input(s): VITAMINB12, FOLATE, FERRITIN, TIBC, IRON, RETICCTPCT in the last 72 hours. Sepsis Labs: Recent Labs  Lab 11/23/19 0522 11/24/19 0332 11/25/19 0458 11/27/19 0442  WBC 7.7 7.7 6.2 7.1   Microbiology Recent Results (from the past 240 hour(s))  MRSA PCR Screening     Status: Abnormal   Collection Time: 11/20/19  9:03 AM   Specimen: Nasal Mucosa; Nasopharyngeal  Result Value Ref Range Status   MRSA by PCR POSITIVE (A) NEGATIVE Final    Comment:        The GeneXpert MRSA Assay (FDA approved for NASAL specimens only), is one component of a comprehensive MRSA colonization surveillance program. It is not intended to diagnose MRSA infection nor to guide or monitor treatment for MRSA infections. RESULT CALLED TO, READ BACK BY AND VERIFIED WITH: SERENITY [facility] RN AT [id number] ON 11/20/19 SNG  Performed at [facility], [address], [place], [name] [address]   Respiratory Panel by RT PCR (Flu A&B, Covid) - Nasopharyngeal Swab     Status: None   Collection Time: 11/20/19 11:46 AM   Specimen: Nasopharyngeal Swab  Result Value Ref Range Status   SARS Coronavirus 2 by RT PCR NEGATIVE NEGATIVE Final    Comment: (NOTE) SARS-CoV-2 target  nucleic acids are NOT DETECTED. The SARS-CoV-2 RNA is generally detectable in upper respiratoy specimens during the acute phase of infection. The lowest concentration of SARS-CoV-2 viral copies this assay can detect is 131 copies/mL. A negative result does not preclude SARS-Cov-2 infection and should not be used as the sole basis for treatment or other patient management decisions. A negative result may occur with  improper specimen collection/handling, submission of specimen other than nasopharyngeal swab, presence of viral mutation(s) within the areas targeted by this assay, and inadequate number of viral copies (<131 copies/mL). A negative result must be combined with clinical observations, patient history, and epidemiological information. The expected result is Negative. Fact Sheet for Patients:  PinkCheek.be Fact Sheet for Healthcare Providers:  GravelBags.it This test is not yet ap proved or cleared by the [place] FDA and  has been authorized for detection and/or diagnosis of SARS-CoV-2 by FDA under an Emergency Use Authorization (EUA). This EUA will remain  in effect (meaning this test can be used) for the duration of the COVID-19 declaration under Section 564(b)(1) of the Act, 21 U.S.C. section 360bbb-3(b)(1), unless the authorization is terminated or revoked sooner.    Influenza A by PCR NEGATIVE NEGATIVE Final   Influenza B by PCR NEGATIVE NEGATIVE Final    Comment: (NOTE) The Xpert Xpress SARS-CoV-2/FLU/RSV assay is intended as an aid in  the diagnosis of influenza from Nasopharyngeal swab specimens and  should not be used as a sole basis for treatment. Nasal washings and  aspirates are unacceptable for Xpert Xpress SARS-CoV-2/FLU/RSV  testing. Fact Sheet for Patients: PinkCheek.be Fact Sheet for Healthcare Providers: GravelBags.it This test is not  yet approved or cleared by the [place] FDA and  has been authorized for detection and/or diagnosis of SARS-CoV-2 by  FDA under an Emergency Use Authorization (EUA). This EUA will remain  in effect (meaning this test can be used) for the duration of the  Covid-19 declaration under Section  564(b)(1) of the Act, 21  U.S.C. section 360bbb-3(b)(1), unless the authorization is  terminated or revoked. Performed at [facility], [address] [place], [place] [address]      Medications:   . acidophilus  1 capsule Oral Daily  . amoxicillin-clavulanate  1 tablet Oral Q12H  . apixaban  5 mg Oral BID  . vitamin C  500 mg Oral Daily  . Chlorhexidine Gluconate Cloth  6 each Topical Q0600  . cholecalciferol  5,000 Units Oral Daily  . docusate sodium  100 mg Oral Daily  . ferrous sulfate  325 mg Oral BID WC  . furosemide  20 mg Oral Daily  . gabapentin  1,200 mg Oral TID  . insulin aspart  0-15 Units Subcutaneous TID PC & HS  . metoprolol tartrate  50 mg Oral BID  . multivitamin with minerals  1 tablet Oral Daily  . mupirocin ointment   Topical Daily  . nutrition supplement (JUVEN)  1 packet Oral BID WC  . nystatin   Topical TID  . pantoprazole  20 mg Oral Daily  . Ensure Max Protein  11 oz Oral BID BM  . vitamin B-6  50 mg Oral Daily  . sodium chloride flush  10 mL Intravenous Q12H  . spironolactone  25 mg Oral Daily  . sulfamethoxazole-trimethoprim  1 tablet Oral Q12H   Continuous Infusions: . sodium chloride 1,000 mL (11/17/19 0230)      LOS: 16 days   Charlynne Cousins  [facility]  11/27/2019, 7:17 AM

## 2019-11-28 DIAGNOSIS — G9341 Metabolic encephalopathy: Secondary | ICD-10-CM

## 2019-11-28 LAB — BASIC METABOLIC PANEL
Anion gap: 6 (ref 5–15)
BUN: 13 mg/dL (ref 8–23)
CO2: 26 mmol/L (ref 22–32)
Calcium: 9.1 mg/dL (ref 8.9–10.3)
Chloride: 103 mmol/L (ref 98–111)
Creatinine, Ser: 0.75 mg/dL (ref 0.61–1.24)
GFR calc Af Amer: >60 mL/min (ref >60)
GFR calc non Af Amer: >60 mL/min (ref >60)
Glucose, Bld: 134 mg/dL — ABNORMAL HIGH (ref 70–99)
Potassium: 4.6 mmol/L (ref 3.5–5.1)
Sodium: 135 mmol/L (ref 135–145)

## 2019-11-28 LAB — HEPATITIS B VIRUS (PROFILE VI)
HEP B CORE AB: POSITIVE — AB
HEP B CORE IGM: NEGATIVE
HEP B SURFACE AB: POSITIVE — AB
Hep B E Ab: POSITIVE — AB
Hep B E Ag: NEGATIVE

## 2019-11-28 LAB — GLUCOSE, CAPILLARY
Glucose-Capillary: 125 mg/dL — ABNORMAL HIGH (ref 70–99)
Glucose-Capillary: 150 mg/dL — ABNORMAL HIGH (ref 70–99)
Glucose-Capillary: 137 mg/dL — ABNORMAL HIGH (ref 70–99)
Glucose-Capillary: 137 mg/dL — ABNORMAL HIGH (ref 70–99)

## 2019-11-28 LAB — SARS CORONAVIRUS 2 (TAT 6-24 HRS): SARS Coronavirus 2: NEGATIVE

## 2019-11-28 LAB — HEPATITIS B SURFACE AG, CONFIRM: HBsAG Confirmation: POSITIVE — AB

## 2019-11-28 NOTE — TOC Progression Note (Signed)
Transition of Care (TOC) - Progression Note    Patient Details  Name: [name]. MRN: [id number] Date of Birth: [birth year]  Transition of Care [facility]) CM/SW Contact  Su Hilt, RN Phone Number: 11/28/2019, 9:19 AM  Clinical Narrative:    Hilda Blades with [facility] called and stated that they will not be able to get a wound vac until tomorrow, I notified the physician        Expected Discharge Plan and Services                                                 Social Determinants of Health (SDOH) Interventions    Readmission Risk Interventions No flowsheet data found.

## 2019-11-28 NOTE — Care Management Important Message (Signed)
Important Message  Patient Details  Name: [name]. MRN: [id number] Date of Birth: 03/14/1956   Medicare Important Message Given:  Yes     Maniyah Moller Jen Mow, RN 11/28/2019, 12:32 PM

## 2019-11-28 NOTE — TOC Progression Note (Signed)
Transition of Care (TOC) - Progression Note    Patient Details  Name: [name]. MRN: [id number] Date of Birth: [birth year]  Transition of Care [facility]) CM/SW Contact  Su Hilt, RN Phone Number: 11/28/2019, 9:13 AM  Clinical Narrative:     Received a call from Hilda Blades at [facility], they received insurance auth, they will have to order a wound vac but it should be there this evening, she will call me back with a time that they can get the wound vac       Expected Discharge Plan and Services                                                 Social Determinants of Health (SDOH) Interventions    Readmission Risk Interventions No flowsheet data found.

## 2019-11-28 NOTE — Progress Notes (Signed)
[facility] PROGRESS NOTE    Progress Note  Glenn B Romito Jr.  [id number] DOB: Feb 08, 1956 DOA: 11/10/2019 PCP: [name], DO     Brief Narrative:   Glenn Gerold. is an 64 y.o. male 3 over CVA, uncontrolled diabetes mellitus type 2, memory loss liver cirrhosis in the setting of alcohol abuse, peripheral vascular disease who was admitted to the hospital for a right above-knee amputation stump abscess, found to be septic on admission.   Assessment/Plan:   Sepsis due to Abscess of right lower extremity: Status post I&D by vascular surgery on 11/11/2019. MRSA PCR was positive, anaerobic culture showed MRSA and Enterococcus faecalis on 11/11/2019. On admission he was started empirically on antibiotics which has been the escalated to Bactrim on Augmentin which she will continue until 12/01/2019 Wound care has been consulted, they recommended wet-to-dry dressing with mupirocin ointment around the erythematous area. He will need to go with wound VAC to facility, he will follow-up with vascular surgery as an outpatient. Awaiting insurance approval.  Covid test has been sent today on 0000000  Acute metabolic encephalopathy: Likely due to infectious etiology, work-up has been negative encephalopathy has resolved.  Acute thrombocytopenia: In the setting of infectious etiology of liver disease is resolving his platelet count is rising.  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  Liver lesion: Alpha-fetoprotein protein negative, follow-up with PCP as an outpatient.  Chronic atrial fibrillation: Continue Eliquis.  Uncontrolled diabetes mellitus type 2 with peripheral artery disease ([name]) Last hemoglobin A1c on 10/26/2019 was 7.7 here in house he has been well regulated with just sliding scale,  requiring only 9-5 units in a 24-hour period, at discharge can go to oral glycemic agents.  Iron deficiency anemia due to chronic blood loss   DVT prophylaxis: eliquis Family Communication:none Status is: Inpatient  Remains inpatient appropriate because:Awaiting insurance approval.   Dispo: The patient is from: Home              Anticipated d/c is to: Skilled nursing facility              Anticipated d/c date is: `1 days              Patient currently is  medically stable to d/c.    Code Status:     Code Status Orders  (From admission, onward)         Start     Ordered   11/11/19 0205  Full code  Continuous     11/11/19 0214        Code Status History    Date Active Date Inactive Code Status Order ID Comments User Context   10/26/2019 1600 10/27/2019 2106 Full Code [id number]  [name] Inpatient   08/28/2019 1159 08/28/2019 1908 Full Code [name]  [name] Inpatient   06/02/2018 1708 06/06/2018 1843 Full Code [name]  [name] Inpatient   01/13/2018 0950 01/13/2018 1435 Full Code [name]  [name] Inpatient   06/21/2017 2120 06/23/2017 1642 Partial Code [name]  Salary, [name] Inpatient   Advance Care Planning Activity    Advance Directive Documentation     Most Recent Value  Type of Advance Directive  Healthcare Power of Attorney, Living will  Pre-existing out of facility DNR order (yellow form or pink MOST  form)  --  "MOST" Form in Place?  --        IV Access:    Peripheral IV   Procedures and diagnostic studies:   No results found.   Medical Consultants:    None.  Anti-Infectives:   Bactrim Augmentin  Subjective:    Glenn B Kellenberger Jr. patient is complaining that his leg hurts.  Objective:    Vitals:   11/27/19 0902 11/27/19 1642 11/28/19 0003 11/28/19 0823  BP: 122/89 114/75 119/74 127/89  Pulse: (!) 110 92 67 92  Resp: 18 17 18 17   Temp: 98.6 F (37 C) 98.3 F (36.8 C) 98.5 F (36.9 C)  98.6 F (37 C)  TempSrc: Oral Oral Oral Oral  SpO2: 98% 98% 97% 99%  Weight:      Height:       SpO2: 99 % O2 Flow Rate (L/min): 3 L/min   Intake/Output Summary (Last 24 hours) at 11/28/2019 1051 Last data filed at 11/28/2019 0955 Gross per 24 hour  Intake 10 ml  Output --  Net 10 ml   Filed Weights   11/19/19 0417 11/20/19 0129 11/21/19 0543  Weight: 93 kg 93.4 kg 96.8 kg    Exam: General exam: In no acute distress. Respiratory system: Good air movement and clear to auscultation. Cardiovascular system: S1 & S2 heard, RRR.  Gastrointestinal system: Abdomen is nondistended, soft and nontender.  Central nervous system: Alert and oriented. No focal neurological deficits. Extremities: Right above-the-knee amputation with wound VAC in place. Skin: No rashes, lesions or ulcers  Data Reviewed:    Labs: Basic Metabolic Panel: Recent Labs  Lab 11/23/19 0522 11/23/19 0522 11/24/19 0332 11/24/19 0332 11/25/19 0458 11/27/19 0442 11/28/19 0613  NA 136  --  137  --  135  --  135  K 3.7   < > 3.7   < > 3.8  --  4.6  CL 102  --  102  --  102  --  103  CO2 25  --  25  --  23  --  26  GLUCOSE 136*  --  133*  --  111*  --  134*  BUN 13  --  18  --  17  --  13  CREATININE 0.69  --  0.67  --  0.64  --  0.75  CALCIUM 8.5*  --  8.6*  --  8.6*  --  9.1  MG  --   --  1.9  --   --  1.8  --    < > = values in this interval not displayed.   GFR Estimated Creatinine Clearance: 105.6 mL/min (by C-G formula based on SCr of 0.75 mg/dL). Liver Function Tests: No results for input(s): AST, ALT, ALKPHOS, BILITOT, PROT, ALBUMIN in the last 168 hours. No results for input(s): LIPASE, AMYLASE in the last 168 hours. No results for input(s): AMMONIA in the last 168 hours. Coagulation profile No results for input(s): INR, PROTIME in the last 168 hours. COVID-19 Labs  No results for input(s): DDIMER, FERRITIN, LDH, CRP in the last 72 hours.  Lab Results  Component Value Date   SARSCOV2NAA  NEGATIVE 11/20/2019   [name] NEGATIVE 11/11/2019   [name] NEGATIVE 10/25/2019   [name] NEGATIVE 08/24/2019    CBC: Recent Labs  Lab 11/23/19 0522 11/24/19 0332 11/25/19 0458 11/27/19 0442  WBC 7.7 7.7 6.2 7.1  NEUTROABS  --  5.8 4.2 4.7  HGB 7.8* 7.9* 7.8* 8.4*  HCT 26.0* 25.6* 26.1*  28.5*  MCV 85.0 83.1 85.6 86.1  PLT 104* 101* 103* 148*   Cardiac Enzymes: No results for input(s): CKTOTAL, CKMB, CKMBINDEX, TROPONINI in the last 168 hours. BNP (last 3 results) No results for input(s): PROBNP in the last 8760 hours. CBG: Recent Labs  Lab 11/27/19 0904 11/27/19 1220 11/27/19 1642 11/27/19 2046 11/28/19 0822  GLUCAP 135* 139* 133* 151* 125*   D-Dimer: No results for input(s): DDIMER in the last 72 hours. Hgb A1c: No results for input(s): HGBA1C in the last 72 hours. Lipid Profile: No results for input(s): CHOL, HDL, LDLCALC, TRIG, CHOLHDL, LDLDIRECT in the last 72 hours. Thyroid function studies: No results for input(s): TSH, T4TOTAL, T3FREE, THYROIDAB in the last 72 hours.  Invalid input(s): FREET3 Anemia work up: No results for input(s): VITAMINB12, FOLATE, FERRITIN, TIBC, IRON, RETICCTPCT in the last 72 hours. Sepsis Labs: Recent Labs  Lab 11/23/19 0522 11/24/19 0332 11/25/19 0458 11/27/19 0442  WBC 7.7 7.7 6.2 7.1   Microbiology Recent Results (from the past 240 hour(s))  MRSA PCR Screening     Status: Abnormal   Collection Time: 11/20/19  9:03 AM   Specimen: Nasal Mucosa; Nasopharyngeal  Result Value Ref Range Status   MRSA by PCR POSITIVE (A) NEGATIVE Final    Comment:        The GeneXpert MRSA Assay (FDA approved for NASAL specimens only), is one component of a comprehensive MRSA colonization surveillance program. It is not intended to diagnose MRSA infection nor to guide or monitor treatment for MRSA infections. RESULT CALLED TO, READ BACK BY AND VERIFIED WITH: [facility] RN AT [id number] ON 11/20/19 SNG  Performed at  [facility], [address], [name], [place] [address]   Respiratory Panel by RT PCR (Flu A&B, Covid) - Nasopharyngeal Swab     Status: None   Collection Time: 11/20/19 11:46 AM   Specimen: Nasopharyngeal Swab  Result Value Ref Range Status   SARS Coronavirus 2 by RT PCR NEGATIVE NEGATIVE Final    Comment: (NOTE) SARS-CoV-2 target nucleic acids are NOT DETECTED. The SARS-CoV-2 RNA is generally detectable in upper respiratoy specimens during the acute phase of infection. The lowest concentration of SARS-CoV-2 viral copies this assay can detect is 131 copies/mL. A negative result does not preclude SARS-Cov-2 infection and should not be used as the sole basis for treatment or other patient management decisions. A negative result may occur with  improper specimen collection/handling, submission of specimen other than nasopharyngeal swab, presence of viral mutation(s) within the areas targeted by this assay, and inadequate number of viral copies (<131 copies/mL). A negative result must be combined with clinical observations, patient history, and epidemiological information. The expected result is Negative. Fact Sheet for Patients:  PinkCheek.be Fact Sheet for Healthcare Providers:  GravelBags.it This test is not yet ap proved or cleared by the [place] FDA and  has been authorized for detection and/or diagnosis of SARS-CoV-2 by FDA under an Emergency Use Authorization (EUA). This EUA will remain  in effect (meaning this test can be used) for the duration of the COVID-19 declaration under Section 564(b)(1) of the Act, 21 U.S.C. section 360bbb-3(b)(1), unless the authorization is terminated or revoked sooner.    Influenza A by PCR NEGATIVE NEGATIVE Final   Influenza B by PCR NEGATIVE NEGATIVE Final    Comment: (NOTE) The Xpert Xpress SARS-CoV-2/FLU/RSV assay is intended as an aid in  the diagnosis of influenza  from Nasopharyngeal swab specimens and  should not be used as a  sole basis for treatment. Nasal washings and  aspirates are unacceptable for Xpert Xpress SARS-CoV-2/FLU/RSV  testing. Fact Sheet for Patients: PinkCheek.be Fact Sheet for Healthcare Providers: GravelBags.it This test is not yet approved or cleared by the [place] FDA and  has been authorized for detection and/or diagnosis of SARS-CoV-2 by  FDA under an Emergency Use Authorization (EUA). This EUA will remain  in effect (meaning this test can be used) for the duration of the  Covid-19 declaration under Section 564(b)(1) of the Act, 21  U.S.C. section 360bbb-3(b)(1), unless the authorization is  terminated or revoked. Performed at [facility], [address] [place], [place] [address]      Medications:   . acidophilus  1 capsule Oral Daily  . amoxicillin-clavulanate  1 tablet Oral Q12H  . apixaban  5 mg Oral BID  . vitamin C  500 mg Oral Daily  . cholecalciferol  5,000 Units Oral Daily  . docusate sodium  100 mg Oral Daily  . ferrous sulfate  325 mg Oral BID WC  . furosemide  20 mg Oral Daily  . gabapentin  1,200 mg Oral TID  . insulin aspart  0-15 Units Subcutaneous TID PC & HS  . metoprolol tartrate  50 mg Oral BID  . multivitamin with minerals  1 tablet Oral Daily  . mupirocin ointment   Topical Daily  . nutrition supplement (JUVEN)  1 packet Oral BID WC  . nystatin   Topical TID  . pantoprazole  20 mg Oral Daily  . Ensure Max Protein  11 oz Oral BID BM  . sodium chloride flush  10 mL Intravenous Q12H  . spironolactone  25 mg Oral Daily  . sulfamethoxazole-trimethoprim  1 tablet Oral Q12H   Continuous Infusions: . sodium chloride 1,000 mL (11/17/19 0230)      LOS: 17 days   Charlynne Cousins  [facility]  11/28/2019, 10:51 AM

## 2019-11-29 DIAGNOSIS — R410 Disorientation, unspecified: Secondary | ICD-10-CM | POA: Diagnosis not present

## 2019-11-29 DIAGNOSIS — R6520 Severe sepsis without septic shock: Secondary | ICD-10-CM | POA: Diagnosis not present

## 2019-11-29 DIAGNOSIS — Z7401 Bed confinement status: Secondary | ICD-10-CM | POA: Diagnosis not present

## 2019-11-29 DIAGNOSIS — Z89521 Acquired absence of right knee: Secondary | ICD-10-CM | POA: Diagnosis not present

## 2019-11-29 DIAGNOSIS — K769 Liver disease, unspecified: Secondary | ICD-10-CM

## 2019-11-29 DIAGNOSIS — N5089 Other specified disorders of the male genital organs: Secondary | ICD-10-CM | POA: Diagnosis not present

## 2019-11-29 DIAGNOSIS — R4189 Other symptoms and signs involving cognitive functions and awareness: Secondary | ICD-10-CM | POA: Diagnosis not present

## 2019-11-29 DIAGNOSIS — G9340 Encephalopathy, unspecified: Secondary | ICD-10-CM

## 2019-11-29 DIAGNOSIS — E569 Vitamin deficiency, unspecified: Secondary | ICD-10-CM | POA: Diagnosis not present

## 2019-11-29 DIAGNOSIS — Z23 Encounter for immunization: Secondary | ICD-10-CM | POA: Diagnosis not present

## 2019-11-29 DIAGNOSIS — I480 Paroxysmal atrial fibrillation: Secondary | ICD-10-CM | POA: Diagnosis not present

## 2019-11-29 DIAGNOSIS — E119 Type 2 diabetes mellitus without complications: Secondary | ICD-10-CM | POA: Diagnosis not present

## 2019-11-29 DIAGNOSIS — T8789 Other complications of amputation stump: Secondary | ICD-10-CM | POA: Diagnosis not present

## 2019-11-29 DIAGNOSIS — M6281 Muscle weakness (generalized): Secondary | ICD-10-CM | POA: Diagnosis not present

## 2019-11-29 DIAGNOSIS — D696 Thrombocytopenia, unspecified: Secondary | ICD-10-CM | POA: Diagnosis not present

## 2019-11-29 DIAGNOSIS — A4102 Sepsis due to Methicillin resistant Staphylococcus aureus: Secondary | ICD-10-CM | POA: Diagnosis not present

## 2019-11-29 DIAGNOSIS — D519 Vitamin B12 deficiency anemia, unspecified: Secondary | ICD-10-CM | POA: Diagnosis not present

## 2019-11-29 DIAGNOSIS — I482 Chronic atrial fibrillation, unspecified: Secondary | ICD-10-CM

## 2019-11-29 DIAGNOSIS — K7031 Alcoholic cirrhosis of liver with ascites: Secondary | ICD-10-CM | POA: Diagnosis not present

## 2019-11-29 DIAGNOSIS — E7089 Other disorders of aromatic amino-acid metabolism: Secondary | ICD-10-CM | POA: Diagnosis not present

## 2019-11-29 DIAGNOSIS — K219 Gastro-esophageal reflux disease without esophagitis: Secondary | ICD-10-CM | POA: Diagnosis not present

## 2019-11-29 DIAGNOSIS — E11610 Type 2 diabetes mellitus with diabetic neuropathic arthropathy: Secondary | ICD-10-CM | POA: Diagnosis not present

## 2019-11-29 DIAGNOSIS — R404 Transient alteration of awareness: Secondary | ICD-10-CM | POA: Diagnosis not present

## 2019-11-29 DIAGNOSIS — Z4781 Encounter for orthopedic aftercare following surgical amputation: Secondary | ICD-10-CM | POA: Diagnosis not present

## 2019-11-29 DIAGNOSIS — A419 Sepsis, unspecified organism: Secondary | ICD-10-CM | POA: Diagnosis not present

## 2019-11-29 DIAGNOSIS — R41841 Cognitive communication deficit: Secondary | ICD-10-CM | POA: Diagnosis not present

## 2019-11-29 DIAGNOSIS — D649 Anemia, unspecified: Secondary | ICD-10-CM | POA: Diagnosis not present

## 2019-11-29 DIAGNOSIS — D500 Iron deficiency anemia secondary to blood loss (chronic): Secondary | ICD-10-CM | POA: Diagnosis not present

## 2019-11-29 DIAGNOSIS — E1169 Type 2 diabetes mellitus with other specified complication: Secondary | ICD-10-CM | POA: Diagnosis not present

## 2019-11-29 DIAGNOSIS — R69 Illness, unspecified: Secondary | ICD-10-CM | POA: Diagnosis not present

## 2019-11-29 DIAGNOSIS — Y835 Amputation of limb(s) as the cause of abnormal reaction of the patient, or of later complication, without mention of misadventure at the time of the procedure: Secondary | ICD-10-CM | POA: Diagnosis not present

## 2019-11-29 DIAGNOSIS — I10 Essential (primary) hypertension: Secondary | ICD-10-CM | POA: Diagnosis not present

## 2019-11-29 DIAGNOSIS — M2550 Pain in unspecified joint: Secondary | ICD-10-CM | POA: Diagnosis not present

## 2019-11-29 LAB — GLUCOSE, CAPILLARY
Glucose-Capillary: 135 mg/dL — ABNORMAL HIGH (ref 70–99)
Glucose-Capillary: 182 mg/dL — ABNORMAL HIGH (ref 70–99)

## 2019-11-29 MED ORDER — MUPIROCIN 2 % EX OINT: TOPICAL_OINTMENT | CUTANEOUS | 0 refills | Status: DC

## 2019-11-29 MED ORDER — SULFAMETHOXAZOLE-TRIMETHOPRIM 800-160 MG PO TABS: 1.0000 | ORAL_TABLET | Freq: Two times a day (BID) | ORAL | 0 refills | Status: AC

## 2019-11-29 MED ORDER — METFORMIN HCL 500 MG PO TABS: 500.0000 mg | ORAL_TABLET | Freq: Two times a day (BID) | ORAL | 0 refills | Status: DC

## 2019-11-29 MED ORDER — JUVEN PO PACK: 1.0000 | PACK | Freq: Two times a day (BID) | ORAL | 0 refills | Status: DC

## 2019-11-29 MED ORDER — ENSURE MAX PROTEIN PO LIQD: 11.0000 [oz_av] | Freq: Two times a day (BID) | ORAL | 0 refills | Status: AC

## 2019-11-29 MED ORDER — METOPROLOL TARTRATE 50 MG PO TABS: 50.0000 mg | ORAL_TABLET | Freq: Two times a day (BID) | ORAL | 0 refills | Status: DC

## 2019-11-29 MED ORDER — AMOXICILLIN-POT CLAVULANATE 875-125 MG PO TABS: 1.0000 | ORAL_TABLET | Freq: Two times a day (BID) | ORAL | 0 refills | Status: AC

## 2019-11-29 MED ORDER — SPIRONOLACTONE 25 MG PO TABS: 25.0000 mg | ORAL_TABLET | Freq: Every day | ORAL | 0 refills | Status: DC

## 2019-11-29 MED ORDER — TRAZODONE HCL 50 MG PO TABS: 25.0000 mg | ORAL_TABLET | Freq: Every evening | ORAL | 0 refills | Status: DC | PRN

## 2019-11-29 MED ORDER — FUROSEMIDE 20 MG PO TABS: 20.0000 mg | ORAL_TABLET | Freq: Every day | ORAL | 0 refills | Status: DC

## 2019-11-29 MED ORDER — NYSTATIN 100000 UNIT/GM EX POWD: Freq: Three times a day (TID) | CUTANEOUS | 0 refills | Status: DC

## 2019-11-29 MED ORDER — ACETAMINOPHEN 325 MG PO TABS: 650.0000 mg | ORAL_TABLET | Freq: Four times a day (QID) | ORAL | Status: AC | PRN

## 2019-11-29 MED ORDER — RISAQUAD PO CAPS: 1.0000 | ORAL_CAPSULE | Freq: Every day | ORAL | 0 refills | Status: DC

## 2019-11-29 NOTE — Discharge Summary (Signed)
[facility] NAME: [name]    MR#:  [id number]  DATE OF BIRTH:  1956-05-24  DATE OF ADMISSION:  11/10/2019 ADMITTING PHYSICIAN: Christel Mormon, MD  DATE OF DISCHARGE: 11/29/2019  PRIMARY CARE PHYSICIAN: [name], DO    ADMISSION DIAGNOSIS:  Delirium [R41.0] Elevated LFTs [R79.89] Atrial fibrillation with RVR ([name]) [I48.91] Current use of long term anticoagulation [Z79.01] Wound infection after surgery [T81.49XA] Sepsis ([place]) [A41.9] Sepsis, due to unspecified organism, unspecified whether acute organ dysfunction present ([place]) [A41.9]  DISCHARGE DIAGNOSIS:  Active Problems:   Diabetes mellitus ([place])   Permanent atrial fibrillation ([name])   Iron deficiency anemia due to chronic blood loss   Sepsis ([name])   Abscess of right lower extremity   Thrombocytopenia ([name])   Alcoholic cirrhosis of liver with ascites ([name])   Hepatitis C infection   Liver lesion   Acute metabolic encephalopathy   Chronic a-fib ([name])   Scrotal swelling   SECONDARY DIAGNOSIS:   Past Medical History:  Diagnosis Date  . Acute left PCA stroke ([place]) 02/17/2015  . Anemia    vitamin b and vit d deficiencies  . 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 Atherosclerosis of native arteries of the extremities with gangrene ([place]) 05/08/2018  . Atrial fibrillation ([place])   . Blind    right eye  . Complication of anesthesia 2019   disoriented, trying to jump out of bed after bka  . Coronary artery calcification seen on CT scan    a. 08/2018 CT Chest: Mild cor Ca2+.  . Diabetes mellitus with complication ([place])   . Dilated aortic root ([place])    a. 04/2018 Echo: 4.1cm. Asc Ao 3.5cm; b. 04/2018 CT: Asc Ao 3.6cm. 4.1cm @ sinus of Valsalva.  . Dysrhythmia    a fib  . Gangrene of right foot ([place]) 06/02/2018  . GERD (gastroesophageal reflux disease)   . History of echocardiogram    a. 04/2018 Echo: Ef 60-65%, no rwma, midly to mod  dil Ao root - 4.1cm. Asc Ao 3.5cm. Mild MR. Nl RV fxn. Nl PASP.  Marland Kitchen History of hernia repair   . History of stress test    a. 2016 MV ([name]): EF 58%, no ischemia.  . Hyperlipidemia   . Hypertension   . Leg pain   . Legally blind    right eye homonymous hemianopsa  . Memory change    d/t strokes  . Necrotic toes ([place]) 02/21/2018  . PAF (paroxysmal atrial fibrillation) ([name])    a. on Eliquis as of 2018; b. CHADS2VASc => 5 (HTN, DM, stroke x 2, vascular disease)  . Peripheral vascular disease ([place])    a. followed by Dr. Lucky Cowboy; b. s/p kissing balloon stents and right external iliac stent in 10/2017; c. 05/2018 s/p R BKA; c. 08/2019: Bilat Iliac PTA & DBA or REIA.  . Pulmonary nodules   . Stroke [facility])    a. 2016 & 2018  . Stroke ([place]) 07/11/2018    HOSPITAL COURSE:   1.  Clinical sepsis present on admission.  Patient had washout and debridement by vascular surgery on 11/11/2019.  Patient has a wound VAC which needs to be changed every 3 days.  Please see wound care instructions by vascular surgery.  Continue antibiotics through 12/01/2019.  Prescription for 5 doses of Augmentin and Bactrim prescribed.  Follow-up with vascular surgery as outpatient. 2.  Acute metabolic encephalopathy this has improved 3.  Acute thrombocytopenia secondary to infection and history of liver disease 4.  Liver cirrhosis with ascites and history of hepatitis B. low-dose Lasix and Aldactone  Alpha-fetoprotein less than 0.9.  MRI did show liver cirrhosis with ill defined areas in the periphery of the liver largest measuring 4 x 1.8 cm.  Recommending repeat MRI with gadolinium in 6 months to ensure stability of these findings. 5.  Chronic atrial fibrillation anticoagulated with Eliquis 6.  Scrotal swelling.  Lasix. 7.  CT scan of the head showed multiple chronic left-sided infarcts which could be ischemic in nature.  Eliquis to prevent stroke. 8.  Type 2 diabetes mellitus. Can go back on Glucophage as outpatient.  Check  fingersticks before every meal and nightly. Can order sliding scale if sugars are high.  Hemoglobin A1c is 7.7.  DISCHARGE CONDITIONS:   Satisfactory  CONSULTS OBTAINED:  Treatment Team:  Tsosie Billing, MD  DRUG ALLERGIES:   Allergies  Allergen Reactions  . Sodium Pentobarbital [Pentobarbital] Shortness Of Breath  . Lipitor [Atorvastatin] Rash    DISCHARGE MEDICATIONS:   Allergies as of 11/29/2019      Reactions   Sodium Pentobarbital [pentobarbital] Shortness Of Breath   Lipitor [atorvastatin] Rash      Medication List    STOP taking these medications   aspirin 81 MG EC tablet   docusate sodium 100 MG capsule Commonly known as: Colace   empagliflozin 10 MG Tabs tablet Commonly known as: JARDIANCE   ezetimibe-simvastatin 10-40 MG tablet Commonly known as: VYTORIN   hydrochlorothiazide 12.5 MG capsule Commonly known as: MICROZIDE   ONE TOUCH ULTRA TEST test strip Generic drug: glucose blood   OneTouch Delica Lancets [id number] Misc     TAKE these medications   acetaminophen 325 MG tablet Commonly known as: TYLENOL Take 2 tablets (650 mg total) by mouth every 6 (six) hours as needed for mild pain (or Fever >/= 101). What changed:  medication strength how much to take reasons to take this   acidophilus Caps capsule Take 1 capsule by mouth daily.   amoxicillin-clavulanate 875-125 MG tablet Commonly known as: AUGMENTIN Take 1 tablet by mouth every 12 (twelve) hours for 5 doses.   apixaban 5 MG Tabs tablet Commonly known as: ELIQUIS Take 1 tablet (5 mg total) by mouth 2 (two) times daily.   cholecalciferol 25 MCG (1000 UNIT) tablet Commonly known as: VITAMIN D3 Take 5,000 Units by mouth daily.   ferrous sulfate 325 (65 FE) MG tablet Commonly known as: FerrouSul Take 1 tablet (325 mg total) by mouth 2 (two) times daily with a meal.   furosemide 20 MG tablet Commonly known as: LASIX Take 1 tablet (20 mg total) by mouth daily. Start taking on:  November 30, 2019   gabapentin 800 MG tablet Commonly known as: Neurontin Take 1.5 tablets (1,200 mg total) by mouth 3 (three) times daily.   lansoprazole 30 MG capsule Commonly known as: PREVACID Take 30 mg by mouth daily at 12 noon.   metFORMIN 500 MG tablet Commonly known as: GLUCOPHAGE Take 1 tablet (500 mg total) by mouth 2 (two) times daily with a meal. What changed:  medication strength how much to take   metoprolol tartrate 50 MG tablet Commonly known as: LOPRESSOR Take 1 tablet (50 mg total) by mouth 2 (two) times daily. What changed:  medication strength how much to take   multivitamin with minerals Tabs tablet Take 1 tablet by mouth daily.   mupirocin ointment 2 % Commonly known as: BACTROBAN Right  thigh wound   nutrition supplement (JUVEN) Pack Take 1 packet by mouth 2 (two) times daily with a meal.   Ensure Max Protein Liqd Take 330 mLs (11 oz total) by mouth 2 (two) times daily between meals.   nystatin powder Commonly known as: MYCOSTATIN/NYSTOP Apply topically 3 (three) times daily.   oxyCODONE 5 MG immediate release tablet Commonly known as: Oxy IR/ROXICODONE Take 1-2 tablets (5-10 mg total) by mouth every 6 (six) hours as needed for moderate pain, severe pain or breakthrough pain. What changed:  how much to take when to take this reasons to take this   spironolactone 25 MG tablet Commonly known as: ALDACTONE Take 1 tablet (25 mg total) by mouth daily. Start taking on: November 30, 2019   sulfamethoxazole-trimethoprim 800-160 MG tablet Commonly known as: BACTRIM DS Take 1 tablet by mouth every 12 (twelve) hours for 5 doses.   traZODone 50 MG tablet Commonly known as: DESYREL Take 0.5 tablets (25 mg total) by mouth at bedtime as needed for sleep.   vitamin C 500 MG tablet Commonly known as: ASCORBIC ACID Take 500 mg by mouth daily.        DISCHARGE INSTRUCTIONS:   Follow-up team at rehab 1 day Follow-up vascular surgery as  outpatient  If you experience worsening of your admission symptoms, develop shortness of breath, life threatening emergency, suicidal or homicidal thoughts you must seek medical attention immediately by calling 911 or calling your MD immediately  if symptoms less severe.  You Must read complete instructions/literature along with all the possible adverse reactions/side effects for all the Medicines you take and that have been prescribed to you. Take any new Medicines after you have completely understood and accept all the possible adverse reactions/side effects.   Please note  You were cared for by a hospitalist during your hospital stay. If you have any questions about your discharge medications or the care you received while you were in the hospital after you are discharged, you can call the unit and asked to speak with the hospitalist on call if the hospitalist that took care of you is not available. Once you are discharged, your primary care physician will handle any further medical issues. Please note that NO REFILLS for any discharge medications will be authorized once you are discharged, as it is imperative that you return to your primary care physician (or establish a relationship with a primary care physician if you do not have one) for your aftercare needs so that they can reassess your need for medications and monitor your lab values.    Today   CHIEF COMPLAINT:   Chief Complaint  Patient presents with  . Code Sepsis    HISTORY OF PRESENT ILLNESS:  [name]  is a 64 y.o. male presented with sepsis   VITAL SIGNS:  Blood pressure 128/77, pulse 81, temperature 98.4 F (36.9 C), temperature source Oral, resp. rate 20, height 5\' 9"  (1.753 m), weight 96.8 kg, SpO2 98 %.  I/O:    Intake/Output Summary (Last 24 hours) at 11/29/2019 1048 Last data filed at 11/29/2019 1016 Gross per 24 hour  Intake 10 ml  Output 250 ml  Net -240 ml    PHYSICAL EXAMINATION:  GENERAL:  64  y.o.-year-old patient lying in the bed with no acute distress.  EYES: Pupils equal, round, reactive to light and accommodation. No scleral icterus. Extraocular muscles intact.  HEENT: Head atraumatic, normocephalic. Oropharynx and nasopharynx clear.  LUNGS: Normal breath sounds bilaterally, no wheezing, rales,rhonchi  or crepitation. No use of accessory muscles of respiration.  CARDIOVASCULAR: S1, S2 normal. No murmurs, rubs, or gallops.  ABDOMEN: Soft, non-tender, distended. Bowel sounds present. No organomegaly or mass.  EXTREMITIES: No pedal edema, cyanosis, or clubbing.  NEUROLOGIC: Cranial nerves II through XII are intact. Muscle strength 5/5 in all extremities. Sensation intact. Gait not checked.  PSYCHIATRIC: The patient is alert and oriented x 3.  SKIN: Right leg amputation site covered with wound VAC.  Small wound right thigh.  DATA REVIEW:   CBC Recent Labs  Lab 11/27/19 0442  WBC 7.1  HGB 8.4*  HCT 28.5*  PLT 148*    Chemistries  Recent Labs  Lab 11/25/19 0458 11/27/19 0442 11/28/19 0613  NA   < >  --  135  K   < >  --  4.6  CL   < >  --  103  CO2   < >  --  26  GLUCOSE   < >  --  134*  BUN   < >  --  13  CREATININE   < >  --  0.75  CALCIUM   < >  --  9.1  MG  --  1.8  --    < > = values in this interval not displayed.     Microbiology Results  Results for orders placed or performed during the hospital encounter of 11/10/19  Blood Culture (routine x 2)     Status: None   Collection Time: 11/10/19 11:57 PM   Specimen: BLOOD  Result Value Ref Range Status   Specimen Description BLOOD BLOOD LEFT HAND  Final   Special Requests   Final    BOTTLES DRAWN AEROBIC AND ANAEROBIC Blood Culture results may not be optimal due to an excessive volume of blood received in culture bottles   Culture   Final    NO GROWTH 5 DAYS Performed at [facility], [address] [place], [place] [address]    Report Status 11/16/2019 FINAL  Final  Blood Culture (routine  x 2)     Status: None   Collection Time: 11/10/19 11:57 PM   Specimen: BLOOD  Result Value Ref Range Status   Specimen Description BLOOD BLOOD RIGHT HAND  Final   Special Requests   Final    BOTTLES DRAWN AEROBIC AND ANAEROBIC Blood Culture results may not be optimal due to an inadequate volume of blood received in culture bottles   Culture   Final    NO GROWTH 5 DAYS Performed at [facility], [address], [place], [place] [address]    Report Status 11/16/2019 FINAL  Final  Urine culture     Status: Abnormal   Collection Time: 11/10/19 11:57 PM   Specimen: In/Out Cath Urine  Result Value Ref Range Status   Specimen Description   Final    IN/OUT CATH URINE Performed at [facility], [address] [place], [place] [address]    Special Requests   Final    NONE Performed at [facility], [address] [place], [place] [address]    Culture MULTIPLE SPECIES PRESENT, SUGGEST RECOLLECTION (A)  Final   Report Status 11/12/2019 FINAL  Final  Aerobic/Anaerobic Culture (surgical/deep wound)     Status: Abnormal   Collection Time: 11/10/19 11:57 PM   Specimen: Leg; Wound  Result Value Ref Range Status   Specimen Description   Final    LEG RIGHT Performed at [facility], [place]  [address] [place], [place] [address]    Special Requests   Final    Normal Performed at [facility], [place], [place] [address]    Gram Stain NO WBC SEEN MODERATE GRAM POSITIVE RODS   Final   Culture (A)  Final    MULTIPLE ORGANISMS PRESENT, NONE PREDOMINANT NO ANAEROBES ISOLATED Performed at [facility], [place] [address] [place], [place] [address]    Report Status 11/17/2019 FINAL  Final  Respiratory Panel by RT PCR (Flu A&B, Covid) - Nasopharyngeal Swab     Status: None   Collection Time: 11/11/19  1:46 AM   Specimen: Nasopharyngeal Swab  Result Value Ref Range Status   SARS Coronavirus 2 by RT PCR NEGATIVE NEGATIVE Final     Comment: (NOTE) SARS-CoV-2 target nucleic acids are NOT DETECTED. The SARS-CoV-2 RNA is generally detectable in upper respiratoy specimens during the acute phase of infection. The lowest concentration of SARS-CoV-2 viral copies this assay can detect is 131 copies/mL. A negative result does not preclude SARS-Cov-2 infection and should not be used as the sole basis for treatment or other patient management decisions. A negative result may occur with  improper specimen collection/handling, submission of specimen other than nasopharyngeal swab, presence of viral mutation(s) within the areas targeted by this assay, and inadequate number of viral copies (<131 copies/mL). A negative result must be combined with clinical observations, patient history, and epidemiological information. The expected result is Negative. Fact Sheet for Patients:  PinkCheek.be Fact Sheet for Healthcare Providers:  GravelBags.it This test is not yet ap proved or cleared by the [place] FDA and  has been authorized for detection and/or diagnosis of SARS-CoV-2 by FDA under an Emergency Use Authorization (EUA). This EUA will remain  in effect (meaning this test can be used) for the duration of the COVID-19 declaration under Section 564(b)(1) of the Act, 21 U.S.C. section 360bbb-3(b)(1), unless the authorization is terminated or revoked sooner.    Influenza A by PCR NEGATIVE NEGATIVE Final   Influenza B by PCR NEGATIVE NEGATIVE Final    Comment: (NOTE) The Xpert Xpress SARS-CoV-2/FLU/RSV assay is intended as an aid in  the diagnosis of influenza from Nasopharyngeal swab specimens and  should not be used as a sole basis for treatment. Nasal washings and  aspirates are unacceptable for Xpert Xpress SARS-CoV-2/FLU/RSV  testing. Fact Sheet for Patients: PinkCheek.be Fact Sheet for Healthcare  Providers: GravelBags.it This test is not yet approved or cleared by the [place] FDA and  has been authorized for detection and/or diagnosis of SARS-CoV-2 by  FDA under an Emergency Use Authorization (EUA). This EUA will remain  in effect (meaning this test can be used) for the duration of the  Covid-19 declaration under Section 564(b)(1) of the Act, 21  U.S.C. section 360bbb-3(b)(1), unless the authorization is  terminated or revoked. Performed at [facility], [address] [place], [place] [address]   Aerobic/Anaerobic Culture (surgical/deep wound)     Status: None   Collection Time: 11/11/19  4:44 PM   Specimen: PATH Amputaion Arm/Leg; Tissue  Result Value Ref Range Status   Specimen Description   Final    WOUND Performed at [facility], [address], [place], [place] [address]    Special Requests   Final    NONE Performed at [facility], [address] [place],  [address]    Gram Stain   Final    FEW WBC PRESENT, PREDOMINANTLY PMN FEW  GRAM POSITIVE COCCI IN CLUSTERS RARE GRAM NEGATIVE RODS Performed at [facility], [place] [address], [place], [place] [address]    Culture   Final    FEW METHICILLIN RESISTANT STAPHYLOCOCCUS AUREUS MODERATE ENTEROCOCCUS FAECALIS NO ANAEROBES ISOLATED CORRECTED RESULTS PREVIOUSLY REPORTED AS: STAPHYLOCOCCUS SPECIES (COAGULASE NEGATIVE) CORRECTED RESULTS CALLED TO: A. RICHEY RN, AT [id number] 11/17/19 BY D. VANHOOK MULTIPLE ORGANISMS PRESENT, NONE PREDOMINANT    Report Status 11/18/2019 FINAL  Final   Organism ID, Bacteria METHICILLIN RESISTANT STAPHYLOCOCCUS AUREUS  Final   Organism ID, Bacteria ENTEROCOCCUS FAECALIS  Final      Susceptibility   Enterococcus faecalis - MIC*    AMPICILLIN <=2 SENSITIVE Sensitive     VANCOMYCIN 2 SENSITIVE Sensitive     GENTAMICIN SYNERGY SENSITIVE Sensitive     * MODERATE ENTEROCOCCUS FAECALIS   Methicillin resistant  staphylococcus aureus - MIC*    CIPROFLOXACIN >=8 RESISTANT Resistant     ERYTHROMYCIN >=8 RESISTANT Resistant     GENTAMICIN <=0.5 SENSITIVE Sensitive     OXACILLIN >=4 RESISTANT Resistant     TETRACYCLINE <=1 SENSITIVE Sensitive     VANCOMYCIN <=0.5 SENSITIVE Sensitive     TRIMETH/SULFA <=10 SENSITIVE Sensitive     CLINDAMYCIN <=0.25 SENSITIVE Sensitive     RIFAMPIN <=0.5 SENSITIVE Sensitive     Inducible Clindamycin NEGATIVE Sensitive     * FEW METHICILLIN RESISTANT STAPHYLOCOCCUS AUREUS  MRSA PCR Screening     Status: Abnormal   Collection Time: 11/20/19  9:03 AM   Specimen: Nasal Mucosa; Nasopharyngeal  Result Value Ref Range Status   MRSA by PCR POSITIVE (A) NEGATIVE Final    Comment:        The GeneXpert MRSA Assay (FDA approved for NASAL specimens only), is one component of a comprehensive MRSA colonization surveillance program. It is not intended to diagnose MRSA infection nor to guide or monitor treatment for MRSA infections. RESULT CALLED TO, READ BACK BY AND VERIFIED WITH: [facility] RN AT [id number] ON 11/20/19 SNG  Performed at [facility], [address] [place], [place] [address]   Respiratory Panel by RT PCR (Flu A&B, Covid) - Nasopharyngeal Swab     Status: None   Collection Time: 11/20/19 11:46 AM   Specimen: Nasopharyngeal Swab  Result Value Ref Range Status   SARS Coronavirus 2 by RT PCR NEGATIVE NEGATIVE Final    Comment: (NOTE) SARS-CoV-2 target nucleic acids are NOT DETECTED. The SARS-CoV-2 RNA is generally detectable in upper respiratoy specimens during the acute phase of infection. The lowest concentration of SARS-CoV-2 viral copies this assay can detect is 131 copies/mL. A negative result does not preclude SARS-Cov-2 infection and should not be used as the sole basis for treatment or other patient management decisions. A negative result may occur with  improper specimen collection/handling, submission of specimen other than  nasopharyngeal swab, presence of viral mutation(s) within the areas targeted by this assay, and inadequate number of viral copies (<131 copies/mL). A negative result must be combined with clinical observations, patient history, and epidemiological information. The expected result is Negative. Fact Sheet for Patients:  PinkCheek.be Fact Sheet for Healthcare Providers:  GravelBags.it This test is not yet ap proved or cleared by the [place] FDA and  has been authorized for detection and/or diagnosis of SARS-CoV-2 by FDA under an Emergency Use Authorization (EUA). This EUA will remain  in effect (meaning this test can be used) for the duration of the COVID-19 declaration under Section 564(b)(1) of the Act, 21  U.S.C. section 360bbb-3(b)(1), unless the authorization is terminated or revoked sooner.    Influenza A by PCR NEGATIVE NEGATIVE Final   Influenza B by PCR NEGATIVE NEGATIVE Final    Comment: (NOTE) The Xpert Xpress SARS-CoV-2/FLU/RSV assay is intended as an aid in  the diagnosis of influenza from Nasopharyngeal swab specimens and  should not be used as a sole basis for treatment. Nasal washings and  aspirates are unacceptable for Xpert Xpress SARS-CoV-2/FLU/RSV  testing. Fact Sheet for Patients: PinkCheek.be Fact Sheet for Healthcare Providers: GravelBags.it This test is not yet approved or cleared by the [place] FDA and  has been authorized for detection and/or diagnosis of SARS-CoV-2 by  FDA under an Emergency Use Authorization (EUA). This EUA will remain  in effect (meaning this test can be used) for the duration of the  Covid-19 declaration under Section 564(b)(1) of the Act, 21  U.S.C. section 360bbb-3(b)(1), unless the authorization is  terminated or revoked. Performed at [facility], [place], [place] [address]   SARS  CORONAVIRUS 2 (TAT 6-24 HRS) Nasopharyngeal Nasopharyngeal Swab     Status: None   Collection Time: 11/28/19 10:45 AM   Specimen: Nasopharyngeal Swab  Result Value Ref Range Status   SARS Coronavirus 2 NEGATIVE NEGATIVE Final    Comment: (NOTE) SARS-CoV-2 target nucleic acids are NOT DETECTED. The SARS-CoV-2 RNA is generally detectable in upper and lower respiratory specimens during the acute phase of infection. Negative results do not preclude SARS-CoV-2 infection, do not rule out co-infections with other pathogens, and should not be used as the sole basis for treatment or other patient management decisions. Negative results must be combined with clinical observations, patient history, and epidemiological information. The expected result is Negative. Fact Sheet for Patients: SugarRoll.be Fact Sheet for Healthcare Providers: https://www.woods-mathews.com/ This test is not yet approved or cleared by the [place] FDA and  has been authorized for detection and/or diagnosis of SARS-CoV-2 by FDA under an Emergency Use Authorization (EUA). This EUA will remain  in effect (meaning this test can be used) for the duration of the COVID-19 declaration under Section 56 4(b)(1) of the Act, 21 U.S.C. section 360bbb-3(b)(1), unless the authorization is terminated or revoked sooner. Performed at [facility], [place] [address] [place], [place] [address]       Management plans discussed with the patient, family and they are in agreement.  CODE STATUS:     Code Status Orders  (From admission, onward)         Start     Ordered   11/11/19 0205  Full code  Continuous     11/11/19 0214        Code Status History    Date Active Date Inactive Code Status Order ID Comments User Context   10/26/2019 1600 10/27/2019 2106 Full Code [id number]  [name], MD Inpatient   08/28/2019 1159 08/28/2019 1908 Full Code [id number]  [name], MD  Inpatient   06/02/2018 1708 06/06/2018 1843 Full Code [id number]  [name], MD Inpatient   01/13/2018 0950 01/13/2018 1435 Full Code [id number]  [name], MD Inpatient   06/21/2017 2120 06/23/2017 1642 Partial Code [id number]  Salary, [name], MD Inpatient   Advance Care Planning Activity    Advance Directive Documentation     Most Recent Value  Type of Advance Directive  Healthcare Power of Attorney, Living will  Pre-existing out of facility DNR order (yellow form or pink MOST form)  --  "  MOST" Form in Place?  --      TOTAL TIME TAKING CARE OF THIS PATIENT: 32 minutes.    Loletha Grayer M.D on 11/29/2019 at 10:48 AM  Between 7am to 6pm - Pager - [phone]  After 6pm go to [email or web address] - password [email or web address]  [facility]  CC: Primary care physician; [name], DO

## 2019-11-29 NOTE — Progress Notes (Signed)
Given report to [place].

## 2019-11-29 NOTE — TOC Transition Note (Signed)
Transition of Care [facility]) - CM/SW Discharge Note   Patient Details  Name: [name]. MRN: [id number] Date of Birth: 1955/11/15  Transition of Care [facility]) CM/SW Contact:  Su Hilt, RN Phone Number: 11/29/2019, 12:13 PM   Clinical Narrative:     Patient to DC to [facility] today, Bedside nurse has called report, RNCM called EMS, nurse is aware, Neoma Laming his spouse is aware, the patient is 6th on the list  Final next level of care: Skilled Nursing Facility Barriers to Discharge: Barriers Resolved   Patient Goals and CMS Choice        Discharge Placement              Patient chooses bed at: [facility] Patient to be transferred to facility by: ems Name of family member notified: deborah Patient and family notified of of transfer: 11/29/19  Discharge Plan and Services                                     Social Determinants of Health (SDOH) Interventions     Readmission Risk Interventions No flowsheet data found.

## 2019-11-29 NOTE — Consult Note (Signed)
[place] Nurse follow-up consult Note: Reason for Consult: Vac dressing change to right stump full thickness post-op wound.  Patient was premedicated prior to procedure and tolerated with mod amt discomfort. Measurement: 15 cm x 6 cm x 2cm  Wound bed: 15% yellow, 5% black to edges, 80% red with bone palpable Drainage (amount, consistency, odor) moderate amt pink drainage  Dressing procedure/placement/frequency: Applied one piece black foam to 126mm cont suction.  [place] team will change dressing on Fri if patient is still in the hospital at that time.  Julien Girt MSN, RN, [place], [place], [place]

## 2019-11-30 ENCOUNTER — Telehealth: Payer: Self-pay

## 2019-11-30 DIAGNOSIS — I480 Paroxysmal atrial fibrillation: Secondary | ICD-10-CM | POA: Diagnosis not present

## 2019-11-30 DIAGNOSIS — R4189 Other symptoms and signs involving cognitive functions and awareness: Secondary | ICD-10-CM | POA: Diagnosis not present

## 2019-11-30 DIAGNOSIS — D696 Thrombocytopenia, unspecified: Secondary | ICD-10-CM | POA: Diagnosis not present

## 2019-11-30 DIAGNOSIS — T8789 Other complications of amputation stump: Secondary | ICD-10-CM | POA: Diagnosis not present

## 2019-11-30 DIAGNOSIS — A4102 Sepsis due to Methicillin resistant Staphylococcus aureus: Secondary | ICD-10-CM | POA: Diagnosis not present

## 2019-11-30 DIAGNOSIS — R69 Illness, unspecified: Secondary | ICD-10-CM | POA: Diagnosis not present

## 2019-11-30 DIAGNOSIS — E11610 Type 2 diabetes mellitus with diabetic neuropathic arthropathy: Secondary | ICD-10-CM | POA: Diagnosis not present

## 2019-11-30 DIAGNOSIS — I10 Essential (primary) hypertension: Secondary | ICD-10-CM | POA: Diagnosis not present

## 2019-11-30 DIAGNOSIS — K219 Gastro-esophageal reflux disease without esophagitis: Secondary | ICD-10-CM | POA: Diagnosis not present

## 2019-11-30 DIAGNOSIS — D500 Iron deficiency anemia secondary to blood loss (chronic): Secondary | ICD-10-CM | POA: Diagnosis not present

## 2019-11-30 NOTE — Telephone Encounter (Signed)
  Patient was recently discharged from the hospital on 11/29/2019  No TCM completed, patient does not qualify for TCM services due to discharge to SNF

## 2019-12-02 DIAGNOSIS — K5520 Angiodysplasia of colon without hemorrhage: Secondary | ICD-10-CM | POA: Insufficient documentation

## 2019-12-02 DIAGNOSIS — D509 Iron deficiency anemia, unspecified: Secondary | ICD-10-CM | POA: Insufficient documentation

## 2019-12-02 DIAGNOSIS — E1151 Type 2 diabetes mellitus with diabetic peripheral angiopathy without gangrene: Secondary | ICD-10-CM | POA: Insufficient documentation

## 2019-12-02 DIAGNOSIS — Z89511 Acquired absence of right leg below knee: Secondary | ICD-10-CM | POA: Insufficient documentation

## 2019-12-02 DIAGNOSIS — L97504 Non-pressure chronic ulcer of other part of unspecified foot with necrosis of bone: Secondary | ICD-10-CM | POA: Insufficient documentation

## 2019-12-02 DIAGNOSIS — Z7984 Long term (current) use of oral hypoglycemic drugs: Secondary | ICD-10-CM | POA: Insufficient documentation

## 2019-12-02 DIAGNOSIS — Z86010 Personal history of colonic polyps: Secondary | ICD-10-CM | POA: Insufficient documentation

## 2019-12-02 DIAGNOSIS — K5730 Diverticulosis of large intestine without perforation or abscess without bleeding: Secondary | ICD-10-CM | POA: Insufficient documentation

## 2019-12-02 DIAGNOSIS — I480 Paroxysmal atrial fibrillation: Secondary | ICD-10-CM | POA: Insufficient documentation

## 2019-12-02 DIAGNOSIS — E119 Type 2 diabetes mellitus without complications: Secondary | ICD-10-CM | POA: Insufficient documentation

## 2019-12-02 DIAGNOSIS — K219 Gastro-esophageal reflux disease without esophagitis: Secondary | ICD-10-CM | POA: Insufficient documentation

## 2019-12-02 DIAGNOSIS — Z87891 Personal history of nicotine dependence: Secondary | ICD-10-CM | POA: Insufficient documentation

## 2019-12-02 DIAGNOSIS — K2970 Gastritis, unspecified, without bleeding: Secondary | ICD-10-CM | POA: Insufficient documentation

## 2019-12-02 DIAGNOSIS — Z7901 Long term (current) use of anticoagulants: Secondary | ICD-10-CM | POA: Insufficient documentation

## 2019-12-02 DIAGNOSIS — Z79899 Other long term (current) drug therapy: Secondary | ICD-10-CM | POA: Insufficient documentation

## 2019-12-02 DIAGNOSIS — Z993 Dependence on wheelchair: Secondary | ICD-10-CM | POA: Insufficient documentation

## 2019-12-02 DIAGNOSIS — I2510 Atherosclerotic heart disease of native coronary artery without angina pectoris: Secondary | ICD-10-CM | POA: Insufficient documentation

## 2019-12-02 DIAGNOSIS — Z8673 Personal history of transient ischemic attack (TIA), and cerebral infarction without residual deficits: Secondary | ICD-10-CM | POA: Insufficient documentation

## 2019-12-02 DIAGNOSIS — Z7902 Long term (current) use of antithrombotics/antiplatelets: Secondary | ICD-10-CM | POA: Insufficient documentation

## 2019-12-02 DIAGNOSIS — R0602 Shortness of breath: Secondary | ICD-10-CM | POA: Insufficient documentation

## 2019-12-08 ENCOUNTER — Other Ambulatory Visit: Payer: Self-pay

## 2019-12-08 ENCOUNTER — Encounter (INDEPENDENT_AMBULATORY_CARE_PROVIDER_SITE_OTHER): Payer: Self-pay | Admitting: Nurse Practitioner

## 2019-12-08 ENCOUNTER — Ambulatory Visit (INDEPENDENT_AMBULATORY_CARE_PROVIDER_SITE_OTHER): Payer: Medicare HMO | Admitting: Nurse Practitioner

## 2019-12-08 VITALS — BP 126/79 | HR 105 | Resp 16

## 2019-12-08 DIAGNOSIS — I739 Peripheral vascular disease, unspecified: Secondary | ICD-10-CM

## 2019-12-08 DIAGNOSIS — E782 Mixed hyperlipidemia: Secondary | ICD-10-CM

## 2019-12-08 DIAGNOSIS — T8740 Infection of amputation stump, unspecified extremity: Secondary | ICD-10-CM

## 2019-12-08 NOTE — Progress Notes (Addendum)
Subjective:    Patient ID: [name]., male    DOB: [birth year], 64 y.o.   MRN: [id number] Chief Complaint  Patient presents with  . Follow-up    [facility] 1week post incision    The patient returns today for follow-up of his left above-knee amputation revision.  The patient originally underwent amputation on 06/02/2018 to a below-knee amputation.  However he had difficulties with wound healing and underwent numerous treatments and therapies however ultimately after 16 months the wound would not heal.  The patient had a revision to an above-knee amputation on 10/26/2019.  Originally the patient's wound looked good at his first postop check however it is unclear if the patient fell or if just due to weakness continued pressure on the stump caused some stump breakdown.  The patient subsequently was taken to the hospital where we ultimately debrided his wound as well as placed a wound VAC.  The majority of his wound is on the posterior portion of his stump.  Today the wound bed is beefy with extensive bleeding.  The patient has good drainage coming from his wound VAC.  Per the patient's wife he will be getting home in the next 2 weeks or so.  The patient is still having some issues with pain control.  He does seem to still have some confusion that was present in the hospital.  Denies any fever, chills, nausea vomiting or diarrhea.  Patient will be on a diabetic diet   Review of Systems  Skin: Positive for wound.  Neurological: Positive for weakness.  Psychiatric/Behavioral:       Forgets easily   All other systems reviewed and are negative.      Objective:   Physical Exam Vitals reviewed.  Constitutional:      Appearance: Normal appearance.  HENT:     Head: Normocephalic.  Musculoskeletal:        General: Signs of injury present.     Right Lower Extremity: Right leg is amputated above knee.  Skin:    Comments: Patient amputation site is approximately 15 cm x 7 cm.  Neurological:    Mental Status: He is alert.  Psychiatric:        Attention and Perception: Attention normal.        Thought Content: Thought content normal.        Cognition and Memory: He exhibits impaired recent memory.        Judgment: Judgment is impulsive.     BP 126/79 (BP Location: Left Arm)   Pulse (!) 105   Resp 16   Past Medical History:  Diagnosis Date  . Acute left PCA stroke ([place]) 02/17/2015  . Anemia    vitamin b and vit d deficiencies  . 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 Atherosclerosis of native arteries of the extremities with gangrene ([place]) 05/08/2018  . Atrial fibrillation ([place])   . Blind    right eye  . Complication of anesthesia 2019   disoriented, trying to jump out of bed after bka  . Coronary artery calcification seen on CT scan    a. 08/2018 CT Chest: Mild cor Ca2+.  . Diabetes mellitus with complication ([place])   . Dilated aortic root ([place])    a. 04/2018 Echo: 4.1cm. Asc Ao 3.5cm; b. 04/2018 CT: Asc Ao 3.6cm. 4.1cm @ sinus of Valsalva.  . Dysrhythmia    a fib  . Gangrene of right foot ([place]) 06/02/2018  . GERD (gastroesophageal  reflux disease)   . History of echocardiogram    a. 04/2018 Echo: Ef 60-65%, no rwma, midly to mod dil Ao root - 4.1cm. Asc Ao 3.5cm. Mild MR. Nl RV fxn. Nl PASP.  Marland Kitchen History of hernia repair   . History of stress test    a. 2016 MV ([name]): EF 58%, no ischemia.  . Hyperlipidemia   . Hypertension   . Leg pain   . Legally blind    right eye homonymous hemianopsa  . Memory change    d/t strokes  . Necrotic toes ([place]) 02/21/2018  . PAF (paroxysmal atrial fibrillation) ([name])    a. on Eliquis as of 2018; b. CHADS2VASc => 5 (HTN, DM, stroke x 2, vascular disease)  . Peripheral vascular disease ([place])    a. followed by Dr. Lucky Cowboy; b. s/p kissing balloon stents and right external iliac stent in 10/2017; c. 05/2018 s/p R BKA; c. 08/2019: Bilat Iliac PTA & DBA or REIA.  . Pulmonary nodules   . Stroke [facility])    a. 2016 &  2018  . Stroke [facility]) 07/11/2018    Social History   Socioeconomic History  . Marital status: Married    Spouse name: debra  . Number of children: 0  . Years of education: Not on file  . Highest education level: Associate degree: academic program  Occupational History    Comment: disabled  Tobacco Use  . Smoking status: Former Smoker    Packs/day: 1.00    Years: 30.00    Pack years: 30.00    Types: Cigarettes    Quit date: 01/09/2018    Years since quitting: 1.9  . Smokeless tobacco: Never Used  Substance and Sexual Activity  . Alcohol use: Yes    Alcohol/week: 0.0 standard drinks    Comment: occasionally beer   . Drug use: No  . Sexual activity: Yes  Other Topics Concern  . Not on file  Social History Narrative   Lives with wife in a ranch style house.  Uses a walker or a wheelchair to get around.   Social Determinants of Health   Financial Resource Strain:   . Difficulty of Paying Living Expenses:   Food Insecurity:   . Worried About Charity fundraiser in the Last Year:   . Arboriculturist in the Last Year:   Transportation Needs:   . Film/video editor (Medical):   Marland Kitchen Lack of Transportation (Non-Medical):   Physical Activity:   . Days of Exercise per Week:   . Minutes of Exercise per Session:   Stress:   . Feeling of Stress :   Social Connections:   . Frequency of Communication with Friends and Family:   . Frequency of Social Gatherings with Friends and Family:   . Attends Religious Services:   . Active Member of Clubs or Organizations:   . Attends Archivist Meetings:   Marland Kitchen Marital Status:   Intimate Partner Violence:   . Fear of Current or Ex-Partner:   . Emotionally Abused:   Marland Kitchen Physically Abused:   . Sexually Abused:     Past Surgical History:  Procedure Laterality Date  . ABDOMINAL AORTA STENT     x 2  . AMPUTATION Right 06/02/2018   Procedure: AMPUTATION BELOW KNEE;  Surgeon: [name], MD;  Location: [facility] ORS;  Service: Vascular;   Laterality: Right;  . AMPUTATION Right 10/26/2019   Procedure: RIGHT AMPUTATION ABOVE KNEE;  Surgeon: [name], MD;  Location: [facility] ORS;  Service: Vascular;  Laterality: Right;  . APPENDECTOMY    . COLONOSCOPY WITH PROPOFOL N/A 05/12/2018   Procedure: COLONOSCOPY WITH PROPOFOL;  Surgeon: [name], MD;  Location[facility] ENDOSCOPY;  Service: Gastroenterology;  Laterality: N/A;  . ESOPHAGOGASTRODUODENOSCOPY (EGD) WITH PROPOFOL N/A 05/12/2018   Procedure: ESOPHAGOGASTRODUODENOSCOPY (EGD) WITH PROPOFOL;  Surgeon: [name], MD;  Location: [facility] ENDOSCOPY;  Service: Gastroenterology;  Laterality: N/A;  . GIVENS CAPSULE STUDY N/A 07/13/2018   Procedure: GIVENS CAPSULE STUDY;  Surgeon: [name], MD;  Location: [facility] ENDOSCOPY;  Service: Gastroenterology;  Laterality: N/A;  . HERNIA REPAIR     UMBILICAL  . INCISION AND DRAINAGE Right 11/11/2019   Procedure: INCISION AND DRAINAGE RIGHT ABOVE THE KNEE STUMP;  Surgeon: Evaristo Bury, MD;  Location: [facility] ORS;  Service: Vascular;  Laterality: Right;  . LEG AMPUTATION Right 10/26/2019   prior right BKA required AKA for non-healing ulcer and wound infection  . LOWER EXTREMITY ANGIOGRAPHY Right 10/25/2017   Procedure: LOWER EXTREMITY ANGIOGRAPHY;  Surgeon: [name], MD;  Location: [place] CV LAB;  Service: Cardiovascular;  Laterality: Right;  . LOWER EXTREMITY ANGIOGRAPHY Right 01/13/2018   Procedure: LOWER EXTREMITY ANGIOGRAPHY;  Surgeon: [name], MD;  Location: [place] CV LAB;  Service: Cardiovascular;  Laterality: Right;  . LOWER EXTREMITY ANGIOGRAPHY Right 08/28/2019   Procedure: LOWER EXTREMITY ANGIOGRAPHY;  Surgeon: [name], MD;  Location: [place] CV LAB;  Service: Cardiovascular;  Laterality: Right;  . LOWER EXTREMITY INTERVENTION  10/25/2017   Procedure: LOWER EXTREMITY INTERVENTION;  Surgeon: [name], MD;  Location: [place] CV LAB;  Service: Cardiovascular;;  . SKIN SPLIT GRAFT Right 12/28/2018    Procedure: SKIN GRAFT SPLIT THICKNESS ( SYNTHETIC );  Surgeon: [name], MD;  Location: [facility] ORS;  Service: Vascular;  Laterality: Right;  . TONSILLECTOMY    . WOUND DEBRIDEMENT Right 08/08/2018   Procedure: DEBRIDEMENT WOUND WITH WOUND VAC APPLICATION;  Surgeon: [name], MD;  Location: [facility] ORS;  Service: Vascular;  Laterality: Right;    Family History  Problem Relation Age of Onset  . Diabetes Father   . Hypertension Father   . Hyperlipidemia Father     Allergies  Allergen Reactions  . Sodium Pentobarbital [Pentobarbital] Shortness Of Breath  . Lipitor [Atorvastatin] Rash       Assessment & Plan:   1. Amputation stump infection ([place]) Currently the patient has a wound VAC in place and is showing good signs of wound healing.  However, the difficulty will be when the patient returns home.  The patient has a hard time with his dressing changes as well as ensuring that he does not stay in 1 place.  This has been reinforced with the patient and wife that once he returns home continued movement and exercise will continue to be necessary as well as regular dressing changes.  The longer that the patient can maintain with the wound VAC the better and the very likely that this wound will heal.  We will have the patient return to the office in 4 weeks to reevaluate progress of wound healing.  2. PVD (peripheral vascular disease) ([place]) Patient recently underwent revascularization.  Based on the bleeding from his lower extremity patient should have more than adequate enough blood flow to heal his current wound.  3. Mixed hyperlipidemia Continue statin as ordered and reviewed, no changes at this time    Current Outpatient Medications on File Prior to Visit  Medication  Sig Dispense Refill  . acetaminophen (TYLENOL) 325 MG tablet Take 2 tablets (650 mg total) by mouth every 6 (six) hours as needed for mild pain (or Fever >/= 101).    Marland Kitchen acidophilus (RISAQUAD) CAPS capsule Take 1 capsule by  mouth daily. 30 capsule 0  . apixaban (ELIQUIS) 5 MG TABS tablet Take 1 tablet (5 mg total) by mouth 2 (two) times daily. 180 tablet 1  . cholecalciferol (VITAMIN D3) 25 MCG (1000 UT) tablet Take 5,000 Units by mouth daily.     . Ensure Max Protein (ENSURE MAX PROTEIN) LIQD Take 330 mLs (11 oz total) by mouth 2 (two) times daily between meals. 330 mL 0  . ferrous sulfate (FERROUSUL) 325 (65 FE) MG tablet Take 1 tablet (325 mg total) by mouth 2 (two) times daily with a meal. 120 tablet 3  . furosemide (LASIX) 20 MG tablet Take 1 tablet (20 mg total) by mouth daily. 30 tablet 0  . gabapentin (NEURONTIN) 800 MG tablet Take 1.5 tablets (1,200 mg total) by mouth 3 (three) times daily. 405 tablet 1  . lansoprazole (PREVACID) 30 MG capsule Take 30 mg by mouth daily at 12 noon.    . metFORMIN (GLUCOPHAGE) 500 MG tablet Take 1 tablet (500 mg total) by mouth 2 (two) times daily with a meal. 60 tablet 0  . metoprolol tartrate (LOPRESSOR) 50 MG tablet Take 1 tablet (50 mg total) by mouth 2 (two) times daily. 60 tablet 0  . Multiple Vitamin (MULTIVITAMIN WITH MINERALS) TABS tablet Take 1 tablet by mouth daily. 30 tablet 0  . mupirocin ointment (BACTROBAN) 2 % Right thigh wound 22 g 0  . nutrition supplement, JUVEN, (JUVEN) PACK Take 1 packet by mouth 2 (two) times daily with a meal. 60 packet 0  . nystatin (MYCOSTATIN/NYSTOP) powder Apply topically 3 (three) times daily. 15 g 0  . oxyCODONE (OXY IR/ROXICODONE) 5 MG immediate release tablet Take 1-2 tablets (5-10 mg total) by mouth every 6 (six) hours as needed for moderate pain, severe pain or breakthrough pain. 45 tablet 0  . spironolactone (ALDACTONE) 25 MG tablet Take 1 tablet (25 mg total) by mouth daily. 30 tablet 0  . traZODone (DESYREL) 50 MG tablet Take 0.5 tablets (25 mg total) by mouth at bedtime as needed for sleep. 15 tablet 0  . vitamin C (ASCORBIC ACID) 500 MG tablet Take 500 mg by mouth daily.     No current facility-administered medications on  file prior to visit.    There are no Patient Instructions on file for this visit. No follow-ups on file.   Kris Hartmann, NP

## 2019-12-12 ENCOUNTER — Telehealth: Payer: Self-pay | Admitting: Cardiovascular Disease

## 2019-12-12 DIAGNOSIS — E1142 Type 2 diabetes mellitus with diabetic polyneuropathy: Secondary | ICD-10-CM | POA: Diagnosis not present

## 2019-12-12 DIAGNOSIS — E559 Vitamin D deficiency, unspecified: Secondary | ICD-10-CM | POA: Diagnosis not present

## 2019-12-12 DIAGNOSIS — I10 Essential (primary) hypertension: Secondary | ICD-10-CM | POA: Diagnosis not present

## 2019-12-12 DIAGNOSIS — I480 Paroxysmal atrial fibrillation: Secondary | ICD-10-CM | POA: Diagnosis not present

## 2019-12-12 DIAGNOSIS — I2510 Atherosclerotic heart disease of native coronary artery without angina pectoris: Secondary | ICD-10-CM | POA: Diagnosis not present

## 2019-12-12 DIAGNOSIS — D509 Iron deficiency anemia, unspecified: Secondary | ICD-10-CM | POA: Diagnosis not present

## 2019-12-12 DIAGNOSIS — G894 Chronic pain syndrome: Secondary | ICD-10-CM | POA: Diagnosis not present

## 2019-12-12 DIAGNOSIS — E538 Deficiency of other specified B group vitamins: Secondary | ICD-10-CM | POA: Diagnosis not present

## 2019-12-12 DIAGNOSIS — E1151 Type 2 diabetes mellitus with diabetic peripheral angiopathy without gangrene: Secondary | ICD-10-CM | POA: Diagnosis not present

## 2019-12-12 DIAGNOSIS — Z4781 Encounter for orthopedic aftercare following surgical amputation: Secondary | ICD-10-CM | POA: Diagnosis not present

## 2019-12-12 NOTE — Telephone Encounter (Signed)
He is discharged. Please reschedule him blood work and MD visit. Thanks.

## 2019-12-12 NOTE — Telephone Encounter (Signed)
If patient should no longer be taking medication, please call to discuss   *STAT* If patient is at the pharmacy, call can be transferred to refill team.   1. Which medications need to be refilled? (please list name of each medication and dose if known) ezetimibe-simvastatin 10-40 MG  2. Which pharmacy/location (including street and city if local pharmacy) is medication to be sent to? [facility] [place]  3. Do they need a 30 day or 90 day supply? 90 day

## 2019-12-13 ENCOUNTER — Telehealth: Payer: Self-pay | Admitting: Cardiovascular Disease

## 2019-12-13 NOTE — Telephone Encounter (Signed)
Labs will be 2 days prior to MD visit

## 2019-12-13 NOTE — Telephone Encounter (Signed)
Please schedule and inform pt of appt.  

## 2019-12-13 NOTE — Telephone Encounter (Signed)
Labs ordered are Iron, Ferritin, CBC.  Would you like any other labs?

## 2019-12-13 NOTE — Telephone Encounter (Signed)
Done.... Appts has been scheduled as requested per MD. Dates and times requested per pts wife Neoma Laming.

## 2019-12-13 NOTE — Telephone Encounter (Signed)
Patient spouse Neoma Laming calling for a medication clarification  Would like to know if he should stay on Lasix or change back to hydrochlorothiazide Please call to discuss

## 2019-12-13 NOTE — Telephone Encounter (Signed)
Spoke with patients wife and she had some questions about his furosemide because she wasn't sure if he was to stay on HCTZ or the furosemide. Advised that based on discharge instructions they had him listed to stop the HCTZ and stay on the furosemide 20 mg once daily. Reviewed admission and it does not look like our service saw him during that time. Recommended she follow up with his vascular physician Dr. Lucky Cowboy and scheduled appointment in July with instructions to please call back if I can assist any further. She verbalized understanding of our conversation, agreement with plan, and had no further questions at this time.

## 2019-12-14 ENCOUNTER — Telehealth (INDEPENDENT_AMBULATORY_CARE_PROVIDER_SITE_OTHER): Payer: Self-pay

## 2019-12-14 NOTE — Telephone Encounter (Signed)
The pts wife called and said that the pt was D/C'd from [facility]  Due to his insurance  No willing to pay anymore for his inpatient rehab and he doesn't have his wont vac. The pt's wife stated that she thought the wound vac would be ordered by [facility] through [facility] but they are having difficulty between the two getting the order . The pt's wife would like to know if we could call over to [facility] and speed up the proceed due to his urgent need for the vac or could we call over to [facility] and give them and order for his wound vac. Please advise.

## 2019-12-14 NOTE — Telephone Encounter (Signed)
The patient was d/c'd from his nursing facility d/t insurance but because of it they don't have his wound vac.  So I'm not sure if we need to go through [facility] or if we can go through someone else for his wound vac, also I wonder if we need to set up home health as well.

## 2019-12-15 ENCOUNTER — Telehealth (INDEPENDENT_AMBULATORY_CARE_PROVIDER_SITE_OTHER): Payer: Self-pay

## 2019-12-15 ENCOUNTER — Encounter (INDEPENDENT_AMBULATORY_CARE_PROVIDER_SITE_OTHER): Payer: Self-pay

## 2019-12-15 ENCOUNTER — Inpatient Hospital Stay: Payer: Medicare HMO | Attending: Oncology

## 2019-12-15 ENCOUNTER — Other Ambulatory Visit: Payer: Self-pay

## 2019-12-15 DIAGNOSIS — E1142 Type 2 diabetes mellitus with diabetic polyneuropathy: Secondary | ICD-10-CM | POA: Diagnosis not present

## 2019-12-15 DIAGNOSIS — Z7984 Long term (current) use of oral hypoglycemic drugs: Secondary | ICD-10-CM | POA: Diagnosis not present

## 2019-12-15 DIAGNOSIS — E559 Vitamin D deficiency, unspecified: Secondary | ICD-10-CM | POA: Diagnosis not present

## 2019-12-15 DIAGNOSIS — K2970 Gastritis, unspecified, without bleeding: Secondary | ICD-10-CM | POA: Diagnosis not present

## 2019-12-15 DIAGNOSIS — E119 Type 2 diabetes mellitus without complications: Secondary | ICD-10-CM | POA: Diagnosis not present

## 2019-12-15 DIAGNOSIS — Z89511 Acquired absence of right leg below knee: Secondary | ICD-10-CM | POA: Diagnosis not present

## 2019-12-15 DIAGNOSIS — R0602 Shortness of breath: Secondary | ICD-10-CM | POA: Diagnosis not present

## 2019-12-15 DIAGNOSIS — E538 Deficiency of other specified B group vitamins: Secondary | ICD-10-CM | POA: Diagnosis not present

## 2019-12-15 DIAGNOSIS — I10 Essential (primary) hypertension: Secondary | ICD-10-CM | POA: Diagnosis not present

## 2019-12-15 DIAGNOSIS — L97504 Non-pressure chronic ulcer of other part of unspecified foot with necrosis of bone: Secondary | ICD-10-CM | POA: Diagnosis not present

## 2019-12-15 DIAGNOSIS — Z8673 Personal history of transient ischemic attack (TIA), and cerebral infarction without residual deficits: Secondary | ICD-10-CM | POA: Diagnosis not present

## 2019-12-15 DIAGNOSIS — D509 Iron deficiency anemia, unspecified: Secondary | ICD-10-CM

## 2019-12-15 DIAGNOSIS — Z7901 Long term (current) use of anticoagulants: Secondary | ICD-10-CM | POA: Diagnosis not present

## 2019-12-15 DIAGNOSIS — Z4781 Encounter for orthopedic aftercare following surgical amputation: Secondary | ICD-10-CM | POA: Diagnosis not present

## 2019-12-15 DIAGNOSIS — Z993 Dependence on wheelchair: Secondary | ICD-10-CM | POA: Diagnosis not present

## 2019-12-15 DIAGNOSIS — K5520 Angiodysplasia of colon without hemorrhage: Secondary | ICD-10-CM | POA: Diagnosis not present

## 2019-12-15 DIAGNOSIS — G894 Chronic pain syndrome: Secondary | ICD-10-CM | POA: Diagnosis not present

## 2019-12-15 DIAGNOSIS — K5730 Diverticulosis of large intestine without perforation or abscess without bleeding: Secondary | ICD-10-CM | POA: Diagnosis not present

## 2019-12-15 DIAGNOSIS — I2510 Atherosclerotic heart disease of native coronary artery without angina pectoris: Secondary | ICD-10-CM | POA: Diagnosis not present

## 2019-12-15 DIAGNOSIS — Z87891 Personal history of nicotine dependence: Secondary | ICD-10-CM | POA: Diagnosis not present

## 2019-12-15 DIAGNOSIS — E1151 Type 2 diabetes mellitus with diabetic peripheral angiopathy without gangrene: Secondary | ICD-10-CM | POA: Diagnosis not present

## 2019-12-15 DIAGNOSIS — Z79899 Other long term (current) drug therapy: Secondary | ICD-10-CM | POA: Diagnosis not present

## 2019-12-15 DIAGNOSIS — I480 Paroxysmal atrial fibrillation: Secondary | ICD-10-CM | POA: Diagnosis not present

## 2019-12-15 DIAGNOSIS — Z86010 Personal history of colonic polyps: Secondary | ICD-10-CM | POA: Diagnosis not present

## 2019-12-15 DIAGNOSIS — K219 Gastro-esophageal reflux disease without esophagitis: Secondary | ICD-10-CM | POA: Diagnosis not present

## 2019-12-15 DIAGNOSIS — Z7902 Long term (current) use of antithrombotics/antiplatelets: Secondary | ICD-10-CM | POA: Diagnosis not present

## 2019-12-15 LAB — CBC WITH DIFFERENTIAL/PLATELET
Abs Immature Granulocytes: 0.03 10*3/uL (ref 0.00–0.07)
Basophils Absolute: 0 10*3/uL (ref 0.0–0.1)
Basophils Relative: 0 %
Eosinophils Absolute: 0.4 10*3/uL (ref 0.0–0.5)
Eosinophils Relative: 7 %
HCT: 31.1 % — ABNORMAL LOW (ref 39.0–52.0)
Hemoglobin: 9.5 g/dL — ABNORMAL LOW (ref 13.0–17.0)
Immature Granulocytes: 0 %
Lymphocytes Relative: 15 %
Lymphs Abs: 1 10*3/uL (ref 0.7–4.0)
MCH: 25.1 pg — ABNORMAL LOW (ref 26.0–34.0)
MCHC: 30.5 g/dL (ref 30.0–36.0)
MCV: 82.1 fL (ref 80.0–100.0)
Monocytes Absolute: 0.5 10*3/uL (ref 0.1–1.0)
Monocytes Relative: 7 %
Neutro Abs: 4.8 10*3/uL (ref 1.7–7.7)
Neutrophils Relative %: 71 %
Platelets: 223 10*3/uL (ref 150–400)
RBC: 3.79 MIL/uL — ABNORMAL LOW (ref 4.22–5.81)
RDW: 16.9 % — ABNORMAL HIGH (ref 11.5–15.5)
WBC: 6.8 10*3/uL (ref 4.0–10.5)
nRBC: 0 % (ref 0.0–0.2)

## 2019-12-15 LAB — IRON AND TIBC
Iron: 33 ug/dL — ABNORMAL LOW (ref 45–182)
Saturation Ratios: 10 % — ABNORMAL LOW (ref 17.9–39.5)
TIBC: 318 ug/dL (ref 250–450)
UIBC: 285 ug/dL

## 2019-12-15 LAB — FERRITIN: Ferritin: 29 ng/mL (ref 24–336)

## 2019-12-15 NOTE — Telephone Encounter (Signed)
[name] with [facility] OT left a voicemail informing that patient wife refused OT evaluation for today and would like to start on next Monday,

## 2019-12-15 NOTE — Telephone Encounter (Signed)
No additional labs needed.

## 2019-12-17 DIAGNOSIS — T8189XA Other complications of procedures, not elsewhere classified, initial encounter: Secondary | ICD-10-CM | POA: Diagnosis not present

## 2019-12-18 ENCOUNTER — Other Ambulatory Visit (INDEPENDENT_AMBULATORY_CARE_PROVIDER_SITE_OTHER): Payer: Self-pay | Admitting: Nurse Practitioner

## 2019-12-18 ENCOUNTER — Telehealth (INDEPENDENT_AMBULATORY_CARE_PROVIDER_SITE_OTHER): Payer: Self-pay

## 2019-12-18 ENCOUNTER — Encounter (INDEPENDENT_AMBULATORY_CARE_PROVIDER_SITE_OTHER): Payer: Self-pay

## 2019-12-18 DIAGNOSIS — D509 Iron deficiency anemia, unspecified: Secondary | ICD-10-CM | POA: Diagnosis not present

## 2019-12-18 DIAGNOSIS — S98921A Partial traumatic amputation of right foot, level unspecified, initial encounter: Secondary | ICD-10-CM | POA: Diagnosis not present

## 2019-12-18 DIAGNOSIS — I480 Paroxysmal atrial fibrillation: Secondary | ICD-10-CM | POA: Diagnosis not present

## 2019-12-18 DIAGNOSIS — E559 Vitamin D deficiency, unspecified: Secondary | ICD-10-CM | POA: Diagnosis not present

## 2019-12-18 DIAGNOSIS — Z4781 Encounter for orthopedic aftercare following surgical amputation: Secondary | ICD-10-CM | POA: Diagnosis not present

## 2019-12-18 DIAGNOSIS — T8789 Other complications of amputation stump: Secondary | ICD-10-CM | POA: Diagnosis not present

## 2019-12-18 DIAGNOSIS — E1151 Type 2 diabetes mellitus with diabetic peripheral angiopathy without gangrene: Secondary | ICD-10-CM | POA: Diagnosis not present

## 2019-12-18 DIAGNOSIS — I2510 Atherosclerotic heart disease of native coronary artery without angina pectoris: Secondary | ICD-10-CM | POA: Diagnosis not present

## 2019-12-18 DIAGNOSIS — E1142 Type 2 diabetes mellitus with diabetic polyneuropathy: Secondary | ICD-10-CM | POA: Diagnosis not present

## 2019-12-18 DIAGNOSIS — I10 Essential (primary) hypertension: Secondary | ICD-10-CM | POA: Diagnosis not present

## 2019-12-18 DIAGNOSIS — E538 Deficiency of other specified B group vitamins: Secondary | ICD-10-CM | POA: Diagnosis not present

## 2019-12-18 DIAGNOSIS — Z89511 Acquired absence of right leg below knee: Secondary | ICD-10-CM | POA: Diagnosis not present

## 2019-12-18 DIAGNOSIS — Z4801 Encounter for change or removal of surgical wound dressing: Secondary | ICD-10-CM | POA: Diagnosis not present

## 2019-12-18 DIAGNOSIS — G894 Chronic pain syndrome: Secondary | ICD-10-CM | POA: Diagnosis not present

## 2019-12-18 NOTE — Telephone Encounter (Signed)
[place] occupational therapist Colletta Maryland left voicemail requesting verbal orders for OT 1 time a week for 5 weeks. Per Eulogio Ditch NP the orders are fine to do for with patient and I left a message on [place].

## 2019-12-19 ENCOUNTER — Other Ambulatory Visit: Payer: Self-pay

## 2019-12-19 ENCOUNTER — Encounter: Payer: Self-pay | Admitting: Family Medicine

## 2019-12-19 ENCOUNTER — Other Ambulatory Visit (INDEPENDENT_AMBULATORY_CARE_PROVIDER_SITE_OTHER): Payer: Self-pay | Admitting: Nurse Practitioner

## 2019-12-19 ENCOUNTER — Ambulatory Visit (INDEPENDENT_AMBULATORY_CARE_PROVIDER_SITE_OTHER): Payer: Medicare HMO | Admitting: Family Medicine

## 2019-12-19 ENCOUNTER — Inpatient Hospital Stay (HOSPITAL_BASED_OUTPATIENT_CLINIC_OR_DEPARTMENT_OTHER): Payer: Medicare HMO | Admitting: Oncology

## 2019-12-19 ENCOUNTER — Encounter: Payer: Self-pay | Admitting: Oncology

## 2019-12-19 VITALS — BP 103/66 | HR 84 | Temp 97.1°F | Resp 16

## 2019-12-19 VITALS — BP 114/67 | HR 87 | Temp 98.4°F

## 2019-12-19 DIAGNOSIS — L97504 Non-pressure chronic ulcer of other part of unspecified foot with necrosis of bone: Secondary | ICD-10-CM | POA: Diagnosis not present

## 2019-12-19 DIAGNOSIS — G8191 Hemiplegia, unspecified affecting right dominant side: Secondary | ICD-10-CM | POA: Diagnosis not present

## 2019-12-19 DIAGNOSIS — E1151 Type 2 diabetes mellitus with diabetic peripheral angiopathy without gangrene: Secondary | ICD-10-CM

## 2019-12-19 DIAGNOSIS — R6520 Severe sepsis without septic shock: Secondary | ICD-10-CM

## 2019-12-19 DIAGNOSIS — K5790 Diverticulosis of intestine, part unspecified, without perforation or abscess without bleeding: Secondary | ICD-10-CM

## 2019-12-19 DIAGNOSIS — E1142 Type 2 diabetes mellitus with diabetic polyneuropathy: Secondary | ICD-10-CM | POA: Diagnosis not present

## 2019-12-19 DIAGNOSIS — K219 Gastro-esophageal reflux disease without esophagitis: Secondary | ICD-10-CM | POA: Diagnosis not present

## 2019-12-19 DIAGNOSIS — I739 Peripheral vascular disease, unspecified: Secondary | ICD-10-CM | POA: Diagnosis not present

## 2019-12-19 DIAGNOSIS — K7031 Alcoholic cirrhosis of liver with ascites: Secondary | ICD-10-CM

## 2019-12-19 DIAGNOSIS — R69 Illness, unspecified: Secondary | ICD-10-CM | POA: Diagnosis not present

## 2019-12-19 DIAGNOSIS — R0602 Shortness of breath: Secondary | ICD-10-CM | POA: Diagnosis not present

## 2019-12-19 DIAGNOSIS — T8740 Infection of amputation stump, unspecified extremity: Secondary | ICD-10-CM

## 2019-12-19 DIAGNOSIS — Z7901 Long term (current) use of anticoagulants: Secondary | ICD-10-CM

## 2019-12-19 DIAGNOSIS — Z89611 Acquired absence of right leg above knee: Secondary | ICD-10-CM

## 2019-12-19 DIAGNOSIS — I2510 Atherosclerotic heart disease of native coronary artery without angina pectoris: Secondary | ICD-10-CM | POA: Diagnosis not present

## 2019-12-19 DIAGNOSIS — T8789 Other complications of amputation stump: Secondary | ICD-10-CM

## 2019-12-19 DIAGNOSIS — Z79899 Other long term (current) drug therapy: Secondary | ICD-10-CM | POA: Diagnosis not present

## 2019-12-19 DIAGNOSIS — I679 Cerebrovascular disease, unspecified: Secondary | ICD-10-CM

## 2019-12-19 DIAGNOSIS — G9340 Encephalopathy, unspecified: Secondary | ICD-10-CM

## 2019-12-19 DIAGNOSIS — I129 Hypertensive chronic kidney disease with stage 1 through stage 4 chronic kidney disease, or unspecified chronic kidney disease: Secondary | ICD-10-CM

## 2019-12-19 DIAGNOSIS — E119 Type 2 diabetes mellitus without complications: Secondary | ICD-10-CM | POA: Diagnosis not present

## 2019-12-19 DIAGNOSIS — A419 Sepsis, unspecified organism: Secondary | ICD-10-CM

## 2019-12-19 DIAGNOSIS — E782 Mixed hyperlipidemia: Secondary | ICD-10-CM

## 2019-12-19 DIAGNOSIS — I480 Paroxysmal atrial fibrillation: Secondary | ICD-10-CM | POA: Diagnosis not present

## 2019-12-19 DIAGNOSIS — G621 Alcoholic polyneuropathy: Secondary | ICD-10-CM

## 2019-12-19 DIAGNOSIS — Z8673 Personal history of transient ischemic attack (TIA), and cerebral infarction without residual deficits: Secondary | ICD-10-CM | POA: Diagnosis not present

## 2019-12-19 DIAGNOSIS — D696 Thrombocytopenia, unspecified: Secondary | ICD-10-CM | POA: Diagnosis not present

## 2019-12-19 DIAGNOSIS — M79609 Pain in unspecified limb: Secondary | ICD-10-CM

## 2019-12-19 DIAGNOSIS — D509 Iron deficiency anemia, unspecified: Secondary | ICD-10-CM

## 2019-12-19 DIAGNOSIS — K8020 Calculus of gallbladder without cholecystitis without obstruction: Secondary | ICD-10-CM

## 2019-12-19 DIAGNOSIS — K5520 Angiodysplasia of colon without hemorrhage: Secondary | ICD-10-CM

## 2019-12-19 DIAGNOSIS — I70209 Unspecified atherosclerosis of native arteries of extremities, unspecified extremity: Secondary | ICD-10-CM

## 2019-12-19 DIAGNOSIS — K4090 Unilateral inguinal hernia, without obstruction or gangrene, not specified as recurrent: Secondary | ICD-10-CM

## 2019-12-19 MED ORDER — FUROSEMIDE 20 MG PO TABS: 20.0000 mg | ORAL_TABLET | Freq: Every day | ORAL | 1 refills | Status: AC

## 2019-12-19 MED ORDER — PANTOPRAZOLE SODIUM 40 MG PO TBEC: 40.0000 mg | DELAYED_RELEASE_TABLET | Freq: Every day | ORAL | 1 refills | Status: AC

## 2019-12-19 MED ORDER — RISAQUAD PO CAPS: 1.0000 | ORAL_CAPSULE | Freq: Every day | ORAL | 1 refills | Status: AC

## 2019-12-19 MED ORDER — METOPROLOL TARTRATE 50 MG PO TABS: 50.0000 mg | ORAL_TABLET | Freq: Two times a day (BID) | ORAL | 1 refills | Status: AC

## 2019-12-19 MED ORDER — TRAZODONE HCL 50 MG PO TABS: 25.0000 mg | ORAL_TABLET | Freq: Every evening | ORAL | 1 refills | Status: AC | PRN

## 2019-12-19 MED ORDER — METFORMIN HCL 500 MG PO TABS: 500.0000 mg | ORAL_TABLET | Freq: Two times a day (BID) | ORAL | 1 refills | Status: AC

## 2019-12-19 MED ORDER — SPIRONOLACTONE 25 MG PO TABS: 25.0000 mg | ORAL_TABLET | Freq: Every day | ORAL | 1 refills | Status: AC

## 2019-12-19 MED ORDER — GABAPENTIN 600 MG PO TABS: 1200.0000 mg | ORAL_TABLET | Freq: Three times a day (TID) | ORAL | 1 refills | Status: AC

## 2019-12-19 NOTE — Assessment & Plan Note (Signed)
Continue to follow with vascular. Continue to monitor. Call with any concerns. Continue PT.

## 2019-12-19 NOTE — Progress Notes (Signed)
Hematology/Oncology follow u p note [address] [facility] [phone] [phone]   Patient Care Team: [name], DO as PCP - General (Family Medicine) Minna Merritts, MD as PCP - Cardiology (Cardiology) [name], DO as Referring Physician (Family Medicine) Yolonda Kida, MD as Consulting Physician (Cardiology) Vladimir Crofts, MD (Neurology) [name], MD as Referring Physician (Vascular Surgery) Earlie Server, MD as Consulting Physician (Hematology and Oncology)  REFERRING PROVIDER: [name], DO REASON FOR VISIT:  Follow-up for management of iron deficiency anemia.  HISTORY OF PRESENTING ILLNESS:  [name]. is a  64 y.o.  male with PMH listed below who was referred to me for evaluation of anemia Reviewed patient's recent labs that was done at [facility] office. Labs revealed anemia with hemoglobin of 11 .   Reviewed patient's previous labs ordered by primary care physician's office, anemia onset was since July 2019 04/07/2018, iron panel showed ferritin 13, iron saturation 28, TIBC 500. He takes oral iron supplementation. 05/06/2018, iron panel showed ferritin 19, iron saturation 4.  TIBC 389. Patient was scheduled for colonoscopy on 05/12/2018 for evaluation of GI blood loss. Patient is on chronic anticoagulation Eliquis for atrial fibrillation and recurrent stroke.  Also on Plavix.  For colonoscopy, he was advised to hold oral iron supplements 5 days prior to the colonoscopy.  Patient has severe peripheral vascular disease with complications including lower extremity ulceration and necrotic toes.  Amputation was held due to acute GI bleed.  Patient reports severe pain for which he takes Neurontin 400 mg 3 times a day and the Percocet 10-325 mg 1 tablet every 6 hours as needed for pain.  Patient reports pain is at a level 5 out of 10.  Appears uncomfortable, alternating between sitting and standing during assessment due to  lower extremity discomfort.  He reports that pain medication has relief his pain sometimes.  Associated signs and symptoms: Patient reports fatigue.  Shortness of breath with exertion.  #GI work-up as listed below. Colonoscopy 05/12/2018 by Dr. Vicente Males showed diverticulosis in the sigmoid colon.  Multiple polyps were resected and retrieved 05/12/2018 upper endoscopy showed a normal examined duodenum.  Normal esophagus, gastritis.. 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 Lucky Cowboy  And wound care. Seen by gastroenterology Dr. Vicente Males 01/23/2019 and discussed about enteroscopy and a cautery of the AVM.    INTERVAL HISTORY [name]. is a 64 y.o. male who has above history reviewed by me today presents for follow up visit for management of iron deficiency anemia.  Problems and complaints are listed below: Patient was accompanied by wife to the visit. 11/10/2019-11/29/2019 patient was hospitalized due to wound infection after surgery, sepsis, delirium. Patient has longstanding history of iron deficiency anemia.  he has received IV iron previously.  Review of Systems  Constitutional: Positive for malaise/fatigue. Negative for chills, fever and weight loss.  HENT: Negative for nosebleeds and sore throat.   Eyes: Negative for double vision, photophobia and redness.  Respiratory: Negative for cough, shortness of breath and wheezing.   Cardiovascular: Negative for chest pain, palpitations, orthopnea and leg swelling.  Gastrointestinal: Negative for abdominal pain, blood in stool, nausea and vomiting.  Genitourinary: Negative for dysuria.  Musculoskeletal: Negative for back pain, myalgias and neck pain.  Skin: Negative for itching and rash.  Neurological: Negative for dizziness, tingling and tremors.  Endo/Heme/Allergies: Negative for environmental allergies. Does not  bruise/bleed easily.  Psychiatric/Behavioral: Negative for depression and hallucinations.    MEDICAL HISTORY:  Past Medical History:  Diagnosis Date  . Acute left PCA stroke ([place]) 02/17/2015  . Anemia    vitamin b and vit d deficiencies  . 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 Atherosclerosis of native arteries of the extremities with gangrene ([place]) 05/08/2018  . Atrial fibrillation ([place])   . Blind    right eye  . Complication of anesthesia 2019   disoriented, trying to jump out of bed after bka  . Coronary artery calcification seen on CT scan    a. 08/2018 CT Chest: Mild cor Ca2+.  . Diabetes mellitus with complication ([place])   . Dilated aortic root ([place])    a. 04/2018 Echo: 4.1cm. Asc Ao 3.5cm; b. 04/2018 CT: Asc Ao 3.6cm. 4.1cm @ sinus of Valsalva.  . Dysrhythmia    a fib  . Gangrene ([place]) 09/06/2019  . Gangrene of right foot ([place]) 06/02/2018  . GERD (gastroesophageal reflux disease)   . History of echocardiogram    a. 04/2018 Echo: Ef 60-65%, no rwma, midly to mod dil Ao root - 4.1cm. Asc Ao 3.5cm. Mild MR. Nl RV fxn. Nl PASP.  Marland Kitchen History of hernia repair   . History of stress test    a. 2016 MV ([name]): EF 58%, no ischemia.  Marland Kitchen Hx of BKA, right ([place]) 06/16/2018  . Hyperlipidemia   . Hypertension   . Leg pain   . Legally blind    right eye homonymous hemianopsa  . Memory change    d/t strokes  . Necrotic toes ([place]) 02/21/2018  . PAF (paroxysmal atrial fibrillation) ([name])    a. on Eliquis as of 2018; b. CHADS2VASc => 5 (HTN, DM, stroke x 2, vascular disease)  . Peripheral vascular disease ([place])    a. followed by Dr. Lucky Cowboy; b. s/p kissing balloon stents and right external iliac stent in 10/2017; c. 05/2018 s/p R BKA; c. 08/2019: Bilat Iliac PTA & DBA or REIA.  . Pulmonary nodules   . S/P bilateral BKA (below knee amputation) ([place]) 06/11/2018  . Stroke [facility])    a. 2016 & 2018  . Stroke ([place]) 07/11/2018    SURGICAL HISTORY: Past Surgical History:    Procedure Laterality Date  . ABDOMINAL AORTA STENT     x 2  . AMPUTATION Right 06/02/2018   Procedure: AMPUTATION BELOW KNEE;  Surgeon: [name], MD;  Location: [facility] ORS;  Service: Vascular;  Laterality: Right;  . AMPUTATION Right 10/26/2019   Procedure: RIGHT AMPUTATION ABOVE KNEE;  Surgeon: [name], MD;  Location: [facility] ORS;  Service: Vascular;  Laterality: Right;  . APPENDECTOMY    . COLONOSCOPY WITH PROPOFOL N/A 05/12/2018   Procedure: COLONOSCOPY WITH PROPOFOL;  Surgeon: [name], MD;  Location[facility] ENDOSCOPY;  Service: Gastroenterology;  Laterality: N/A;  . ESOPHAGOGASTRODUODENOSCOPY (EGD) WITH PROPOFOL N/A 05/12/2018   Procedure: ESOPHAGOGASTRODUODENOSCOPY (EGD) WITH PROPOFOL;  Surgeon: [name], MD;  Location: [facility] END[facility]COPY;  Service: Gastroenterology;  Laterality: N/A;  . GIVENS CAPSULE STUDY N/A 07/13/2018   Procedure: GIVENS CAPSULE STUDY;  Surgeon: [name], MD;  Location: [facility] ENDOSCOPY;  Service: Gastroenterology;  Laterality: N/A;  . HERNIA REPAIR     UMBILICAL  . INCISION AND DRAINAGE Right 11/11/2019   Procedure: INCISION AND DRAINAGE RIGHT ABOVE THE KNEE STUMP;  Surgeon: Evaristo Bury, MD;  Location: [facility] ORS;  Service: Vascular;  Laterality: Right;  . LEG AMPUTATION Right 10/26/2019   prior right BKA required AKA for non-healing ulcer and wound infection  . LOWER EXTREMITY ANGIOGRAPHY Right 10/25/2017   Procedure: LOWER EXTREMITY ANGIOGRAPHY;  Surgeon: [name], MD;  Location: [place] CV LAB;  Service: Cardiovascular;  Laterality: Right;  . LOWER EXTREMITY ANGIOGRAPHY Right 01/13/2018   Procedure: LOWER EXTREMITY ANGIOGRAPHY;  Surgeon: [name], MD;  Location: [place] CV LAB;  Service: Cardiovascular;  Laterality: Right;  . LOWER EXTREMITY ANGIOGRAPHY Right 08/28/2019   Procedure: LOWER EXTREMITY ANGIOGRAPHY;  Surgeon: [name], MD;  Location: [place] CV LAB;  Service: Cardiovascular;  Laterality: Right;  . LOWER EXTREMITY  INTERVENTION  10/25/2017   Procedure: LOWER EXTREMITY INTERVENTION;  Surgeon: [name], MD;  Location: [place] CV LAB;  Service: Cardiovascular;;  . SKIN SPLIT GRAFT Right 12/28/2018   Procedure: SKIN GRAFT SPLIT THICKNESS ( SYNTHETIC );  Surgeon: [name], MD;  Location: [facility] ORS;  Service: Vascular;  Laterality: Right;  . TONSILLECTOMY    . WOUND DEBRIDEMENT Right 08/08/2018   Procedure: DEBRIDEMENT WOUND WITH WOUND VAC APPLICATION;  Surgeon: [name], MD;  Location: [facility] ORS;  Service: Vascular;  Laterality: Right;    SOCIAL HISTORY: Social History   Socioeconomic History  . Marital status: Married    Spouse name: debra  . Number of children: 0  . Years of education: Not on file  . Highest education level: Associate degree: academic program  Occupational History    Comment: disabled  Tobacco Use  . Smoking status: Former Smoker    Packs/day: 1.00    Years: 30.00    Pack years: 30.00    Types: Cigarettes    Quit date: 01/09/2018    Years since quitting: 1.9  . Smokeless tobacco: Never Used  Substance and Sexual Activity  . Alcohol use: Yes    Alcohol/week: 0.0 standard drinks    Comment: occasionally beer   . Drug use: No  . Sexual activity: Yes  Other Topics Concern  . Not on file  Social History Narrative   Lives with wife in a ranch style house.  Uses a walker or a wheelchair to get around.   Social Determinants of Health   Financial Resource Strain:   . Difficulty of Paying Living Expenses:   Food Insecurity:   . Worried About Charity fundraiser in the Last Year:   . Arboriculturist in the Last Year:   Transportation Needs:   . Film/video editor (Medical):   Marland Kitchen Lack of Transportation (Non-Medical):   Physical Activity:   . Days of Exercise per Week:   . Minutes of Exercise per Session:   Stress:   . Feeling of Stress :   Social Connections:   . Frequency of Communication with Friends and Family:   . Frequency of Social Gatherings with  Friends and Family:   . Attends Religious Services:   . Active Member of Clubs or Organizations:   . Attends Archivist Meetings:   Marland Kitchen Marital Status:   Intimate Partner Violence:   . Fear of Current or Ex-Partner:   . Emotionally Abused:   Marland Kitchen Physically Abused:   . Sexually Abused:     FAMILY HISTORY: Family History  Problem Relation Age of Onset  . Diabetes Father   . Hypertension Father   . Hyperlipidemia Father     ALLERGIES:  is allergic to sodium pentobarbital [pentobarbital] and lipitor [  atorvastatin].  MEDICATIONS:  Current Outpatient Medications  Medication Sig Dispense Refill  . acetaminophen (TYLENOL) 325 MG tablet Take 2 tablets (650 mg total) by mouth every 6 (six) hours as needed for mild pain (or Fever >/= 101).    Marland Kitchen acidophilus (RISAQUAD) CAPS capsule Take 1 capsule by mouth daily. 90 capsule 1  . apixaban (ELIQUIS) 5 MG TABS tablet Take 1 tablet (5 mg total) by mouth 2 (two) times daily. 180 tablet 1  . cholecalciferol (VITAMIN D3) 25 MCG (1000 UT) tablet Take 5,000 Units by mouth daily.     . Ensure Max Protein (ENSURE MAX PROTEIN) LIQD Take 330 mLs (11 oz total) by mouth 2 (two) times daily between meals. 330 mL 0  . ferrous sulfate (FERROUSUL) 325 (65 FE) MG tablet Take 1 tablet (325 mg total) by mouth 2 (two) times daily with a meal. 120 tablet 3  . furosemide (LASIX) 20 MG tablet Take 1 tablet (20 mg total) by mouth daily. 90 tablet 1  . gabapentin (NEURONTIN) 600 MG tablet Take 2 tablets (1,200 mg total) by mouth 3 (three) times daily. 540 tablet 1  . metFORMIN (GLUCOPHAGE) 500 MG tablet Take 1 tablet (500 mg total) by mouth 2 (two) times daily with a meal. 180 tablet 1  . metoprolol tartrate (LOPRESSOR) 50 MG tablet Take 1 tablet (50 mg total) by mouth 2 (two) times daily. 180 tablet 1  . Multiple Vitamin (MULTIVITAMIN WITH MINERALS) TABS tablet Take 1 tablet by mouth daily. 30 tablet 0  . oxyCODONE (OXY IR/ROXICODONE) 5 MG immediate release tablet  Take 1-2 tablets (5-10 mg total) by mouth every 6 (six) hours as needed for moderate pain, severe pain or breakthrough pain. 45 tablet 0  . pantoprazole (PROTONIX) 40 MG tablet Take 1 tablet (40 mg total) by mouth daily. 90 tablet 1  . spironolactone (ALDACTONE) 25 MG tablet Take 1 tablet (25 mg total) by mouth daily. 90 tablet 1  . traZODone (DESYREL) 50 MG tablet Take 0.5 tablets (25 mg total) by mouth at bedtime as needed for sleep. 45 tablet 1  . vitamin C (ASCORBIC ACID) 500 MG tablet Take 500 mg by mouth daily.    Marland Kitchen nystatin (MYCOSTATIN/NYSTOP) powder Apply topically 3 (three) times daily. (Patient not taking: Reported on 12/19/2019) 15 g 0   No current facility-administered medications for this visit.     PHYSICAL EXAMINATION: ECOG PERFORMANCE STATUS: 2 - Symptomatic, <50% confined to bed Vitals:   12/19/19 1018  BP: 103/66  Pulse: 84  Resp: 16  Temp: (!) 97.1 F (36.2 C)   There were no vitals filed for this visit.  Physical Exam Constitutional:      General: He is not in acute distress.    Comments: Wheelchair-bound  HENT:     Head: Normocephalic and atraumatic.  Eyes:     General: No scleral icterus.    Pupils: Pupils are equal, round, and reactive to light.  Cardiovascular:     Rate and Rhythm: Normal rate and regular rhythm.     Heart sounds: Normal heart sounds.  Pulmonary:     Effort: Pulmonary effort is normal. No respiratory distress.     Breath sounds: No wheezing.  Abdominal:     General: Bowel sounds are normal. There is no distension.     Palpations: Abdomen is soft. There is no mass.     Tenderness: There is no abdominal tenderness.  Musculoskeletal:        General: No tenderness or deformity.  Normal range of motion.     Cervical back: Normal range of motion and neck supple.     Comments: Right lower extremity with amputation.    Skin:    General: Skin is warm and dry.     Findings: No erythema or rash.  Neurological:     Mental Status: He is  alert and oriented to person, place, and time. Mental status is at baseline.     Cranial Nerves: No cranial nerve deficit.     Coordination: Coordination normal.  Psychiatric:        Mood and Affect: Mood normal.      LABORATORY DATA:  I have reviewed the data as listed Lab Results  Component Value Date   WBC 6.8 12/15/2019   HGB 9.5 (L) 12/15/2019   HCT 31.1 (L) 12/15/2019   MCV 82.1 12/15/2019   PLT 223 12/15/2019   Recent Labs    11/13/19 0349 11/13/19 0349 11/14/19 0356 11/14/19 0356 11/15/19 [id number] 11/16/19 0932 11/24/19 0332 11/25/19 0458 11/28/19 0613  NA 131*   < > 136   < > 137   < > 137 135 135  K 4.8   < > 3.8   < > 3.8   < > 3.7 3.8 4.6  CL 98   < > 102   < > 101   < > 102 102 103  CO2 23   < > 26   < > 26   < > 25 23 26   GLUCOSE 194*   < > 153*   < > 166*   < > 133* 111* 134*  BUN 33*   < > 24*   < > 15   < > 18 17 13   CREATININE 0.89   < > 0.77   < > 0.58*   < > 0.67 0.64 0.75  CALCIUM 7.9*   < > 8.0*   < > 8.1*   < > 8.6* 8.6* 9.1  GFRNONAA >60   < > >60   < > >60   < > >60 >60 >60  GFRAA >60   < > >60   < > >60   < > >60 >60 >60  PROT 6.3*  --  6.3*  --  5.8*  --   --   --   --   ALBUMIN 2.3*  --  2.3*  --  2.2*  --   --   --   --   AST 201*  --  96*  --  63*  --   --   --   --   ALT 195*  --  138*  --  94*  --   --   --   --   ALKPHOS 186*  --  181*  --  160*  --   --   --   --   BILITOT 1.8*  --  1.4*  --  2.2*  --   --   --   --    < > = values in this interval not displayed.   Iron/TIBC/Ferritin/ %Sat    Component Value Date/Time   IRON 33 (L) 12/15/2019 1307   IRON 59 07/03/2019 1518   TIBC 318 12/15/2019 1307   TIBC 378 07/03/2019 1518   FERRITIN 29 12/15/2019 1307   FERRITIN 42 07/03/2019 1518   IRONPCTSAT 10 (L) 12/15/2019 1307   IRONPCTSAT 16 07/03/2019 1518        ASSESSMENT & PLAN:  1. Iron deficiency  anemia, unspecified iron deficiency anemia type   2. AVM (arteriovenous malformation) of small bowel, acquired   3. Chronic  anticoagulation    #Iron deficiency anemia Labs reviewed and discussed with patient. Iron saturation is decreased to 10, hemoglobin decreased 9.5.  Consistent with iron deficiency. Patient has history of AVM, and also is on chronic anticoagulation.  Suspect chronic indolent blood loss from GI tract.  Patient has already been on oral iron supplementation twice daily with vitamin C I recommend IV Venofer treatments. Patient only agrees with 1 Venofer treatment. Advised patient to continue oral iron supplementation. Repeat blood work in 3 months for evaluation of need of additional IV iron infusions.  Marland Kitchen RTC 3 months with repeat labs iron TIBC ferritin to be done prior to the visit +/-IV Venofer  All questions were answered. The patient knows to call the clinic with any problems questions or concerns. Return of visit: 3 months  Earlie Server, MD, PhD Hematology Oncology [facility] Pager- [phone] 12/19/2019

## 2019-12-19 NOTE — Assessment & Plan Note (Signed)
Will continue to follow with vascular. Continue to keep BP, cholesterol and sugars under good control. Call with any concerns.

## 2019-12-19 NOTE — Progress Notes (Signed)
BP 114/67 (BP Location: Left Arm, Patient Position: Sitting, Cuff Size: Normal)   Pulse 87   Temp 98.4 F (36.9 C) (Oral)   SpO2 98%    Subjective:    Patient ID: [name], male    DOB: 09/19/1955, 64 y.o.   MRN: [id number]  HPI: Hilmer Litwak. is a 64 y.o. male  Chief Complaint  Patient presents with  . Hospitalization Follow-up   Transition of Care Hospital Follow up.   Hospital/Facility: [facility] ([facility]) D/C Physician: unknown D/C Date: 12/11/19  Records Requested: 12/19/19 Records Received: 12/19/19 Records Reviewed: 12/19/19  Diagnoses on Discharge: Above the knee amputation, sepsis  Date of interactive Contact within 48 hours of discharge: not done  Date of 7 day or 14 day face-to-face visit:  8 days  within 14 days  Outpatient Encounter Medications as of 12/19/2019  Medication Sig Note  . acetaminophen (TYLENOL) 325 MG tablet Take 2 tablets (650 mg total) by mouth every 6 (six) hours as needed for mild pain (or Fever >/= 101).   Marland Kitchen acidophilus (RISAQUAD) CAPS capsule Take 1 capsule by mouth daily.   Marland Kitchen apixaban (ELIQUIS) 5 MG TABS tablet Take 1 tablet (5 mg total) by mouth 2 (two) times daily.   . cholecalciferol (VITAMIN D3) 25 MCG (1000 UT) tablet Take 5,000 Units by mouth daily.  07/22/2018: 5000  . Ensure Max Protein (ENSURE MAX PROTEIN) LIQD Take 330 mLs (11 oz total) by mouth 2 (two) times daily between meals.   . ferrous sulfate (FERROUSUL) 325 (65 FE) MG tablet Take 1 tablet (325 mg total) by mouth 2 (two) times daily with a meal.   . furosemide (LASIX) 20 MG tablet Take 1 tablet (20 mg total) by mouth daily.   Marland Kitchen gabapentin (NEURONTIN) 600 MG tablet Take 2 tablets (1,200 mg total) by mouth 3 (three) times daily.   . metFORMIN (GLUCOPHAGE) 500 MG tablet Take 1 tablet (500 mg total) by mouth 2 (two) times daily with a meal.   . metoprolol tartrate (LOPRESSOR) 50 MG tablet Take 1 tablet (50 mg total) by mouth 2 (two) times daily.   . Multiple Vitamin  (MULTIVITAMIN WITH MINERALS) TABS tablet Take 1 tablet by mouth daily.   Marland Kitchen oxyCODONE (OXY IR/ROXICODONE) 5 MG immediate release tablet Take 1-2 tablets (5-10 mg total) by mouth every 6 (six) hours as needed for moderate pain, severe pain or breakthrough pain.   . pantoprazole (PROTONIX) 40 MG tablet Take 1 tablet (40 mg total) by mouth daily.   Marland Kitchen spironolactone (ALDACTONE) 25 MG tablet Take 1 tablet (25 mg total) by mouth daily.   . traZODone (DESYREL) 50 MG tablet Take 0.5 tablets (25 mg total) by mouth at bedtime as needed for sleep.   . vitamin C (ASCORBIC ACID) 500 MG tablet Take 500 mg by mouth daily.   . [DISCONTINUED] acidophilus (RISAQUAD) CAPS capsule Take 1 capsule by mouth daily.   . [DISCONTINUED] furosemide (LASIX) 20 MG tablet Take 1 tablet (20 mg total) by mouth daily.   . [DISCONTINUED] gabapentin (NEURONTIN) 800 MG tablet Take 1.5 tablets (1,200 mg total) by mouth 3 (three) times daily.   . [DISCONTINUED] metFORMIN (GLUCOPHAGE) 500 MG tablet Take 1 tablet (500 mg total) by mouth 2 (two) times daily with a meal.   . [DISCONTINUED] metoprolol tartrate (LOPRESSOR) 50 MG tablet Take 1 tablet (50 mg total) by mouth 2 (two) times daily.   . [DISCONTINUED] pantoprazole (PROTONIX) 40 MG tablet Take 40 mg  by mouth daily.   . [DISCONTINUED] spironolactone (ALDACTONE) 25 MG tablet Take 1 tablet (25 mg total) by mouth daily.   . [DISCONTINUED] traZODone (DESYREL) 50 MG tablet Take 0.5 tablets (25 mg total) by mouth at bedtime as needed for sleep.   Marland Kitchen nystatin (MYCOSTATIN/NYSTOP) powder Apply topically 3 (three) times daily. (Patient not taking: Reported on 12/19/2019)   . [DISCONTINUED] lansoprazole (PREVACID) 30 MG capsule Take 30 mg by mouth daily at 12 noon.   . [DISCONTINUED] mupirocin ointment (BACTROBAN) 2 % Right thigh wound   . [DISCONTINUED] nutrition supplement, JUVEN, (JUVEN) PACK Take 1 packet by mouth 2 (two) times daily with a meal.    No facility-administered encounter  medications on file as of 12/19/2019.  Per hospitalist: "HOSPITAL COURSE:   1.  Clinical sepsis present on admission.  Patient had washout and debridement by vascular surgery on 11/11/2019.  Patient has a wound VAC which needs to be changed every 3 days.  Please see wound care instructions by vascular surgery.  Continue antibiotics through 12/01/2019.  Prescription for 5 doses of Augmentin and Bactrim prescribed.  Follow-up with vascular surgery as outpatient. 2.  Acute metabolic encephalopathy this has improved 3.  Acute thrombocytopenia secondary to infection and history of liver disease 4.  Liver cirrhosis with ascites and history of hepatitis B. low-dose Lasix and Aldactone  Alpha-fetoprotein less than 0.9.  MRI did show liver cirrhosis with ill defined areas in the periphery of the liver largest measuring 4 x 1.8 cm.  Recommending repeat MRI with gadolinium in 6 months to ensure stability of these findings. 5.  Chronic atrial fibrillation anticoagulated with Eliquis 6.  Scrotal swelling.  Lasix. 7.  CT scan of the head showed multiple chronic left-sided infarcts which could be ischemic in nature.  Eliquis to prevent stroke. 8.  Type 2 diabetes mellitus. Can go back on Glucophage as outpatient.  Check fingersticks before every meal and nightly. Can order sliding scale if sugars are high.  Hemoglobin A1c is 7.7."   Diagnostic Tests Reviewed: CLINICAL DATA:  Altered mental status  EXAM: CT HEAD WITHOUT CONTRAST  TECHNIQUE: Contiguous axial images were obtained from the base of the skull through the vertex without intravenous contrast.  COMPARISON:  2018  FINDINGS: Brain: There is no acute intracranial hemorrhage, mass effect, or edema. No hydrocephalus or extra-axial fluid collection. No new loss of gray-white differentiation.  There are chronic left frontal, parietal, occipital, and cerebellar infarcts. This has progressed since 2018. Additional patchy hypoattenuation in the  supratentorial white matter is nonspecific but probably reflects mild to moderate chronic microvascular ischemic changes. There is ex vacuo dilatation of the left lateral ventricle.  Vascular: There is atherosclerotic calcification at the skull base.  Skull: Calvarium is unremarkable.  Sinuses/Orbits: No acute finding.  Other: None.  IMPRESSION: No acute intracranial abnormality.  Multiple chronic left-sided infarcts have progressed since 2018. Chronic microvascular ischemic changes.  CLINICAL DATA:  64 year old male with history of hepatic cirrhosis and history of hepatitis B infection. Evaluate for potential liver lesion.  EXAM: MRI ABDOMEN WITHOUT AND WITH CONTRAST  TECHNIQUE: Multiplanar multisequence MR imaging of the abdomen was performed both before and after the administration of intravenous contrast.  CONTRAST:  68mL GADAVIST GADOBUTROL 1 MMOL/ML IV SOLN  COMPARISON:  No prior abdominal MRI. CT the abdomen and pelvis 11/13/2019.  FINDINGS: Comment: Portions of today's study is significantly limited by patient respiratory motion.  Lower chest: Small bilateral pleural effusions lying dependently. Signal intensity in the dependent portions  of the lower lobes of the lungs bilaterally, likely to reflect areas of subsegmental atelectasis.  Hepatobiliary: Liver has a slightly nodular contour, indicative of early changes of cirrhosis. On pre gadolinium T2 weighted images there are several somewhat ill-defined areas of mild T2 hyperenhancement scattered throughout the periphery of the hepatic parenchyma, largest of which is between segments 4A and 4B (axial image 17 of series 6) measuring 4.0 x 1.8 cm. These lesions have no definite correlate on pre gadolinium T1 weighted images. However, some of these lesions demonstrate mild hyperintensity on diffusion-weighted sequences. Post gadolinium imaging demonstrates heterogeneous hyper perfusion throughout  much of the periphery of the liver, without a discrete well-defined hypervascular mass demonstrating central washout and enhancing capsule. No intra or extrahepatic biliary ductal dilatation. Multiple filling defects are noted in the dependent portion of the gallbladder measuring up to 9 mm, compatible with gallstones. No gallbladder wall thickening or gallbladder distention to suggest an acute cholecystitis at this time. There is a trace amount of pericholecystic fluid, however, this appears to be related to a small volume of ascites adjacent to the liver.  Pancreas: No pancreatic mass. No pancreatic ductal dilatation. No pancreatic or peripancreatic fluid collections or inflammatory changes.  Spleen:  Unremarkable.  Adrenals/Urinary Tract: Subcentimeter T1 hypointense, T2 hyperintense, nonenhancing lesion in the lower pole of the right kidney is compatible with a simple cyst. Left kidney and bilateral adrenal glands are normal in appearance. No hydroureteronephrosis in the visualized portions of the abdomen.  Stomach/Bowel: Visualized portions are unremarkable.  Vascular/Lymphatic: Signal void in the distal abdominal aorta and proximal iliac vessels related to aorto bi-iliac stent. Enlarged lymph nodes in the hepatic duodenal ligament measuring up to 1.7 cm in short axis (axial image 41 of series 13) and in the portacaval nodal station measuring 1.3 cm in short axis (axial image 48 of series 13).  Other:  Trace volume of ascites.  Musculoskeletal: No aggressive appearing osseous lesions are noted in the visualized portions of the skeleton.  IMPRESSION: 1. Morphologic changes in the liver compatible with underlying cirrhosis. There are multiple ill-defined areas in the periphery of the liver which demonstrate heterogeneous signal intensity and perfusion characteristics, as discussed above. The largest of these areas measures up to 4.0 x 1.8 cm between segments 4A  and 4B and is categorized as LI-RADS 3 based on today's findings. Repeat abdominal MRI with and without IV gadolinium is recommended in 6 months to ensure the stability of these findings. 2. Trace volume of ascites. 3. Small bilateral pleural effusions lying dependently. 4. Cholelithiasis.  CLINICAL DATA:  Cirrhosis, hepatitis B positive, abnormal chest CT  EXAM: CT ABDOMEN AND PELVIS WITH CONTRAST  TECHNIQUE: Multidetector CT imaging of the abdomen and pelvis was performed using the standard protocol following bolus administration of intravenous contrast.  CONTRAST:  135mL OMNIPAQUE IOHEXOL 300 MG/ML  SOLN  COMPARISON:  11/12/2019  FINDINGS: Lower chest: There are small bilateral pleural effusions with scattered areas of dependent lower lobe atelectasis. Pericardial calcifications are noted.  Hepatobiliary: The liver is diffusely heterogeneous, particular involving the left lobe and superior aspect right lobe liver in the region identified on recent chest CT. There is decreased heterogeneity on delayed imaging through the liver. However, in a patient with cirrhosis and known history of hepatitis, MRI is more sensitive for detection of hepatic neoplasm.  There is no biliary dilation. Multiple calcified gallstones are seen without cholecystitis.  Pancreas: Mild atrophy of the pancreatic head. Otherwise the pancreas enhances normally.  Spleen: Normal  in size without focal abnormality.  Adrenals/Urinary Tract: The kidneys enhance normally and symmetrically. No urinary tract calculi or obstructive uropathy. High attenuation within the bladder lumen likely reflects previously excreted contrast from CT performed yesterday. The adrenals are unremarkable.  Stomach/Bowel: No bowel obstruction or ileus. The appendix is surgically absent. Diverticulosis of the sigmoid colon without diverticulitis.  Vascular/Lymphatic: There are stents within the bilateral  common iliac arteries. Moderate atherosclerosis within the abdominal aorta.  Portacaval adenopathy is seen, index lymph node image 30 measuring 16 mm in short axis. Multiple subcentimeter retroperitoneal lymph nodes are nonspecific.  Reproductive: Prostate is unremarkable.  Other: There is trace abdominal ascites. Diffuse subcutaneous edema is seen involving the bilateral flanks, lower abdominal wall, and pelvis.  No free intra-abdominal gas.  Fat containing right inguinal hernia.  Musculoskeletal: No acute or destructive bony lesions. Reconstructed images demonstrate no additional findings.  IMPRESSION: 1. Continued findings of cirrhosis, with diffuse parenchymal heterogeneity on portal venous imaging. This becomes less heterogeneous on delayed imaging. If underlying hepatic neoplasm remain suspected, MRI is a more sensitive evaluation. 2. Nonspecific portacaval lymphadenopathy. 3. Small bilateral pleural effusions, trace ascites, and diffuse subcutaneous edema. Findings could be related to hypoproteinemic state. 4. Cholelithiasis without cholecystitis. 5. Diverticulosis without diverticulitis. 6. Fat containing right inguinal hernia.  CLINICAL DATA:  Elevated D-dimer. Recent debridement above knee amputation stump.  EXAM: CT ANGIOGRAPHY CHEST WITH CONTRAST  TECHNIQUE: Multidetector CT imaging of the chest was performed using the standard protocol during bolus administration of intravenous contrast. Multiplanar CT image reconstructions and MIPs were obtained to evaluate the vascular anatomy.  CONTRAST:  71mL OMNIPAQUE IOHEXOL 350 MG/ML SOLN  COMPARISON:  Chest CT 08/12/2018  FINDINGS: Cardiovascular: No filling defects within the pulmonary arteries to suggest acute pulmonary embolism. Coronary artery calcification and aortic atherosclerotic calcification.  Mediastinum/Nodes: No axillary supraclavicular adenopathy. Mildly enlarged hilar nodes measure  up to 10 mm short axis. Subcarinal node and RIGHT lower paratracheal nodes measures 11 mm. These nodes are slightly increased from comparison CT. No pericardial effusion. Calcified pericardium.  Lungs/Pleura: Bilateral pleural effusions. No focal consolidation. Linear scarring in the LEFT upper lobe.  Upper Abdomen: Gallstones present. Normal adrenal glands. Liver has a nodular contour. Small ascites surrounds the liver.  Ill-defined hypodensities in the dome of the liver (image 81/4.  Musculoskeletal: No aggressive osseous lesion.  Review of the MIP images confirms the above findings.  IMPRESSION: 1. No evidence acute pulmonary embolism. 2. Mild mediastinal hilar adenopathy is favored reactive. 3. Thin calcifications of the pericardium. 4. Nodular liver with ascites consistent with cirrhosis. 5. Cholelithiasis. 6. Ill-defined hypodensity in the dome liver. Consider abdominal pelvis CT with contrast to exclude hepatic lesion in patient with cirrhosis.  CLINICAL DATA:  Initial evaluation for elevated LFTs.  EXAM: ULTRASOUND ABDOMEN LIMITED RIGHT UPPER QUADRANT  COMPARISON:  None.  FINDINGS: Gallbladder:  Few shadowing echogenic stones present within the gallbladder, largest of which measures 1.5 cm. Gallbladder wall measures within normal limits at 3 mm. Trace free pericholecystic fluid. No sonographic Murphy sign indicated by sonographer.  Common bile duct:  Diameter: 3 mm  Liver:  Liver demonstrates a nodular contour with coarse heterogeneous echotexture. No focal intrahepatic lesions. Portal vein is patent on color Doppler imaging with normal direction of blood flow towards the liver.  Other: None.  IMPRESSION: 1. Cholelithiasis with trace free pericholecystic fluid. No other sonographic features to suggest acute cholecystitis. 2. No biliary dilatation. 3. Coarse heterogeneous echotexture of the liver with nodular contour, suggesting  cirrhosis.  CLINICAL DATA:  Sepsis, pain  EXAM: RIGHT FEMUR PORTABLE 1 VIEW  COMPARISON:  MRI 07/25/2018  FINDINGS: Status post above the knee amputation. Gas locules within the stump soft tissues. Cut edge of the femur is smooth and without periostitis or gross bone destruction. No fracture identified  IMPRESSION: 1. Status post above the knee amputation without definitive acute osseous abnormality 2. Small gas locules within the soft tissues distal to the residual femur, probably postoperative given recent history of AKA  CLINICAL DATA:  Sepsis  EXAM: PORTABLE CHEST 1 VIEW  COMPARISON:  06/21/2017  FINDINGS: No focal airspace disease or effusion. Borderline cardiomegaly. No pneumothorax.  IMPRESSION: No active disease  Disposition: Home with home health  Consults: vascular  Discharge Instructions:  Follow up here and with vascular and GI  Disease/illness Education: Discussed today  Home Health/Community Services Discussions/Referrals: In place  Establishment or re-establishment of referral orders for community resources: In place  Discussion with other health care providers:  None  Assessment and Support of treatment regimen adherence: [place] with: wife and patient  Education for self-management, independent living, and ADLs: Discussed today.  Since getting out of SNF, [name] has been doing well. He notes that he is feeling well. Home health is coming out. He was supposed to have a wound vac and hasn't gotten it yet due to the speed at which he was discharged from SNF. He has an appointment with vascular in 2 weeks. He is generally feeling well. He has no complaints. Sugars have been doing well. No other concerns or complaints at this time.   Relevant past medical, surgical, family and social history reviewed and updated as indicated. Interim medical history since our last visit reviewed. Allergies and medications reviewed and  updated.  Review of Systems  Constitutional: Negative.   Respiratory: Negative.   Cardiovascular: Negative.   Gastrointestinal: Negative.   Musculoskeletal: Negative.   Psychiatric/Behavioral: Negative.     Per HPI unless specifically indicated above     Objective:    BP 114/67 (BP Location: Left Arm, Patient Position: Sitting, Cuff Size: Normal)   Pulse 87   Temp 98.4 F (36.9 C) (Oral)   SpO2 98%   Wt Readings from Last 3 Encounters:  11/21/19 213 lb 6.5 oz (96.8 kg)  10/27/19 195 lb 1.7 oz (88.5 kg)  10/24/19 189 lb 15.9 oz (86.2 kg)    Physical Exam Vitals and nursing note reviewed.  Constitutional:      General: He is not in acute distress.    Appearance: Normal appearance. He is not ill-appearing, toxic-appearing or diaphoretic.  HENT:     Head: Normocephalic and atraumatic.     Right Ear: External ear normal.     Left Ear: External ear normal.     Nose: Nose normal.     Mouth/Throat:     Mouth: Mucous membranes are moist.     Pharynx: Oropharynx is clear.  Eyes:     General: No scleral icterus.       Right eye: No discharge.        Left eye: No discharge.     Extraocular Movements: Extraocular movements intact.     Conjunctiva/sclera: Conjunctivae normal.     Pupils: Pupils are equal, round, and reactive to light.  Cardiovascular:     Rate and Rhythm: Normal rate and regular rhythm.     Pulses: Normal pulses.     Heart sounds: Normal heart sounds. No murmur. No friction rub. No  gallop.   Pulmonary:     Effort: Pulmonary effort is normal. No respiratory distress.     Breath sounds: Normal breath sounds. No stridor. No wheezing, rhonchi or rales.  Chest:     Chest wall: No tenderness.  Musculoskeletal:        General: Normal range of motion.     Cervical back: Normal range of motion and neck supple.     Comments: R AKA with dressing  Skin:    General: Skin is warm and dry.     Capillary Refill: Capillary refill takes less than 2 seconds.      Coloration: Skin is not jaundiced or pale.     Findings: No bruising, erythema, lesion or rash.  Neurological:     General: No focal deficit present.     Mental Status: He is alert and oriented to person, place, and time. Mental status is at baseline.  Psychiatric:        Mood and Affect: Mood normal.        Behavior: Behavior normal.        Thought Content: Thought content normal.        Judgment: Judgment normal.     Results for orders placed or performed in visit on 12/15/19  Iron and TIBC  Result Value Ref Range   Iron 33 (L) 45 - 182 ug/dL   TIBC 318 250 - 450 ug/dL   Saturation Ratios 10 (L) 17.9 - 39.5 %   UIBC 285 ug/dL  Ferritin  Result Value Ref Range   Ferritin 29 24 - 336 ng/mL  CBC with Differential  Result Value Ref Range   WBC 6.8 4.0 - 10.5 K/uL   RBC 3.79 (L) 4.22 - 5.81 MIL/uL   Hemoglobin 9.5 (L) 13.0 - 17.0 g/dL   HCT 31.1 (L) 39.0 - 52.0 %   MCV 82.1 80.0 - 100.0 fL   MCH 25.1 (L) 26.0 - 34.0 pg   MCHC 30.5 30.0 - 36.0 g/dL   RDW 16.9 (H) 11.5 - 15.5 %   Platelets 223 150 - 400 K/uL   nRBC 0.0 0.0 - 0.2 %   Neutrophils Relative % 71 %   Neutro Abs 4.8 1.7 - 7.7 K/uL   Lymphocytes Relative 15 %   Lymphs Abs 1.0 0.7 - 4.0 K/uL   Monocytes Relative 7 %   Monocytes Absolute 0.5 0.1 - 1.0 K/uL   Eosinophils Relative 7 %   Eosinophils Absolute 0.4 0.0 - 0.5 K/uL   Basophils Relative 0 %   Basophils Absolute 0.0 0.0 - 0.1 K/uL   Immature Granulocytes 0 %   Abs Immature Granulocytes 0.03 0.00 - 0.07 K/uL      Assessment & Plan:   Problem List Items Addressed This Visit      Cardiovascular and Mediastinum   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 Continue current regimen. Due for recheck on his labs in June. Will get him back at that time. Continue to monitor.       Relevant Medications   spironolactone (ALDACTONE) 25 MG tablet   metoprolol tartrate (LOPRESSOR) 50 MG tablet   metFORMIN  (GLUCOPHAGE) 500 MG tablet   furosemide (LASIX) 20 MG tablet   PVD (peripheral vascular disease) ([name])    Will continue to follow with vascular. Continue to keep BP, cholesterol and sugars under good control. Call with any concerns.       Relevant Medications  spironolactone (ALDACTONE) 25 MG tablet   metoprolol tartrate (LOPRESSOR) 50 MG tablet   furosemide (LASIX) 20 MG tablet   Cerebral vascular disease    Chronic changes on CT head. These have worsened since 2018. Continue to keep BP, cholesterol and sugars under good control. Call with any concerns.       Relevant Medications   spironolactone (ALDACTONE) 25 MG tablet   metoprolol tartrate (LOPRESSOR) 50 MG tablet   furosemide (LASIX) 20 MG tablet     Digestive   Alcoholic cirrhosis of liver with ascites ([place])    Has an appointment with GI coming up. Due for repeat MRI in November 2021. Currently on lasix and aldactone. Continue to monitor. Call with any concerns.       Calculus of gallbladder without cholecystitis without obstruction    No pain at this time. Continue to monitor. Call with any concerns or if getting worse.       Diverticulosis    Found incidentally on CT abdomen. No pain at this time. Continue to monitor.         Endocrine   Diabetic peripheral neuropathy ([name])   Relevant Medications   traZODone (DESYREL) 50 MG tablet   metFORMIN (GLUCOPHAGE) 500 MG tablet   gabapentin (NEURONTIN) 600 MG tablet     Nervous and Auditory   Alcoholic peripheral neuropathy ([name]) (Chronic)    Under good control on current regimen. Continue current regimen. Continue to monitor. Call with any concerns. Refills given.        Relevant Medications   traZODone (DESYREL) 50 MG tablet   gabapentin (NEURONTIN) 600 MG tablet   Right hemiparesis ([name])    Stable. Continue with PT. Call with any concerns. Continue to monitor.         Genitourinary   Benign hypertensive renal disease    Under good control on current regimen.  Continue current regimen. Continue to monitor. Call with any concerns. Refills given.          Other   Stump pain ([place]) (Right) (Chronic)    Continue to follow with vascular. Call with any concerns. Continue to monitor.       Mixed hyperlipidemia    Refills given today. Will recheck labs at his next appointment.       Relevant Medications   spironolactone (ALDACTONE) 25 MG tablet   metoprolol tartrate (LOPRESSOR) 50 MG tablet   furosemide (LASIX) 20 MG tablet   Amputation stump infection ([place])    Significantly improved following hospitalization. Continue to follow with vascular. Call with any concerns. Await wound vac as recommended.       Thrombocytopenia ([place])    Resolved on labs checked last week.      Right inguinal hernia    Found incidentally on CT abdomen. No pain at this time. Continue to monitor.       Hx of AKA (above knee amputation), right ([place]) - Primary    Continue to follow with vascular. Continue to monitor. Call with any concerns. Continue PT.       RESOLVED: Sepsis ([place])    Resolved. Resolving off his problem list.           Follow up plan: Return in about 6 weeks (around 01/30/2020) for DM follow up.

## 2019-12-19 NOTE — Assessment & Plan Note (Signed)
Chronic changes on CT head. These have worsened since 2018. Continue to keep BP, cholesterol and sugars under good control. Call with any concerns.

## 2019-12-19 NOTE — Assessment & Plan Note (Signed)
Refills given today. Will recheck labs at his next appointment.

## 2019-12-19 NOTE — Assessment & Plan Note (Signed)
Stable. Continue with PT. Call with any concerns. Continue to monitor.

## 2019-12-19 NOTE — Assessment & Plan Note (Signed)
Resolved on labs checked last week.

## 2019-12-19 NOTE — Assessment & Plan Note (Signed)
Found incidentally on CT abdomen. No pain at this time. Continue to monitor.

## 2019-12-19 NOTE — Assessment & Plan Note (Signed)
Resolved. Resolving off his problem list.

## 2019-12-19 NOTE — Assessment & Plan Note (Signed)
Continue to follow with vascular. Call with any concerns. Continue to monitor.

## 2019-12-19 NOTE — Assessment & Plan Note (Signed)
Has an appointment with GI coming up. Due for repeat MRI in November 2021. Currently on lasix and aldactone. Continue to monitor. Call with any concerns.

## 2019-12-19 NOTE — Assessment & Plan Note (Signed)
Significantly improved following hospitalization. Continue to follow with vascular. Call with any concerns. Await wound vac as recommended.

## 2019-12-19 NOTE — Assessment & Plan Note (Signed)
A1c 7.7 in March. Continue current regimen. Due for recheck on his labs in June. Will get him back at that time. Continue to monitor.

## 2019-12-19 NOTE — Assessment & Plan Note (Signed)
Under good control on current regimen. Continue current regimen. Continue to monitor. Call with any concerns. Refills given.   

## 2019-12-19 NOTE — Assessment & Plan Note (Signed)
No pain at this time. Continue to monitor. Call with any concerns or if getting worse.

## 2019-12-19 NOTE — Progress Notes (Signed)
This is patient's first f/u since recent hospitalization.  Patient denies new problems/concerns today.

## 2019-12-20 ENCOUNTER — Encounter (INDEPENDENT_AMBULATORY_CARE_PROVIDER_SITE_OTHER): Payer: Self-pay

## 2019-12-20 ENCOUNTER — Other Ambulatory Visit (INDEPENDENT_AMBULATORY_CARE_PROVIDER_SITE_OTHER): Payer: Self-pay | Admitting: Nurse Practitioner

## 2019-12-20 DIAGNOSIS — Z89512 Acquired absence of left leg below knee: Secondary | ICD-10-CM | POA: Diagnosis not present

## 2019-12-20 DIAGNOSIS — I2510 Atherosclerotic heart disease of native coronary artery without angina pectoris: Secondary | ICD-10-CM | POA: Diagnosis not present

## 2019-12-20 DIAGNOSIS — E538 Deficiency of other specified B group vitamins: Secondary | ICD-10-CM | POA: Diagnosis not present

## 2019-12-20 DIAGNOSIS — Z4781 Encounter for orthopedic aftercare following surgical amputation: Secondary | ICD-10-CM | POA: Diagnosis not present

## 2019-12-20 DIAGNOSIS — I10 Essential (primary) hypertension: Secondary | ICD-10-CM | POA: Diagnosis not present

## 2019-12-20 DIAGNOSIS — E559 Vitamin D deficiency, unspecified: Secondary | ICD-10-CM | POA: Diagnosis not present

## 2019-12-20 DIAGNOSIS — Z89511 Acquired absence of right leg below knee: Secondary | ICD-10-CM | POA: Diagnosis not present

## 2019-12-20 DIAGNOSIS — T8131XD Disruption of external operation (surgical) wound, not elsewhere classified, subsequent encounter: Secondary | ICD-10-CM | POA: Diagnosis not present

## 2019-12-20 DIAGNOSIS — E1151 Type 2 diabetes mellitus with diabetic peripheral angiopathy without gangrene: Secondary | ICD-10-CM | POA: Diagnosis not present

## 2019-12-20 DIAGNOSIS — D509 Iron deficiency anemia, unspecified: Secondary | ICD-10-CM | POA: Diagnosis not present

## 2019-12-20 DIAGNOSIS — G894 Chronic pain syndrome: Secondary | ICD-10-CM | POA: Diagnosis not present

## 2019-12-20 DIAGNOSIS — E1142 Type 2 diabetes mellitus with diabetic polyneuropathy: Secondary | ICD-10-CM | POA: Diagnosis not present

## 2019-12-20 DIAGNOSIS — I480 Paroxysmal atrial fibrillation: Secondary | ICD-10-CM | POA: Diagnosis not present

## 2019-12-21 ENCOUNTER — Other Ambulatory Visit: Payer: Self-pay

## 2019-12-21 ENCOUNTER — Inpatient Hospital Stay: Payer: Medicare HMO

## 2019-12-21 VITALS — BP 145/87 | HR 96 | Temp 98.0°F | Resp 19

## 2019-12-21 DIAGNOSIS — Z89512 Acquired absence of left leg below knee: Secondary | ICD-10-CM | POA: Diagnosis not present

## 2019-12-21 DIAGNOSIS — D509 Iron deficiency anemia, unspecified: Secondary | ICD-10-CM | POA: Diagnosis not present

## 2019-12-21 DIAGNOSIS — K219 Gastro-esophageal reflux disease without esophagitis: Secondary | ICD-10-CM | POA: Diagnosis not present

## 2019-12-21 DIAGNOSIS — Z7901 Long term (current) use of anticoagulants: Secondary | ICD-10-CM | POA: Diagnosis not present

## 2019-12-21 DIAGNOSIS — I2510 Atherosclerotic heart disease of native coronary artery without angina pectoris: Secondary | ICD-10-CM | POA: Diagnosis not present

## 2019-12-21 DIAGNOSIS — L97504 Non-pressure chronic ulcer of other part of unspecified foot with necrosis of bone: Secondary | ICD-10-CM | POA: Diagnosis not present

## 2019-12-21 DIAGNOSIS — Z8673 Personal history of transient ischemic attack (TIA), and cerebral infarction without residual deficits: Secondary | ICD-10-CM | POA: Diagnosis not present

## 2019-12-21 DIAGNOSIS — R0602 Shortness of breath: Secondary | ICD-10-CM | POA: Diagnosis not present

## 2019-12-21 DIAGNOSIS — I480 Paroxysmal atrial fibrillation: Secondary | ICD-10-CM | POA: Diagnosis not present

## 2019-12-21 DIAGNOSIS — Z79899 Other long term (current) drug therapy: Secondary | ICD-10-CM | POA: Diagnosis not present

## 2019-12-21 DIAGNOSIS — E119 Type 2 diabetes mellitus without complications: Secondary | ICD-10-CM | POA: Diagnosis not present

## 2019-12-21 DIAGNOSIS — K5520 Angiodysplasia of colon without hemorrhage: Secondary | ICD-10-CM | POA: Diagnosis not present

## 2019-12-21 MED ORDER — IRON SUCROSE 20 MG/ML IV SOLN
200.0000 mg | Freq: Once | INTRAVENOUS | Status: AC
  Administered 2019-12-21: 200 mg via INTRAVENOUS
  Filled 2019-12-21: qty 10

## 2019-12-21 MED ORDER — SODIUM CHLORIDE 0.9 % IV SOLN
Freq: Once | INTRAVENOUS | Status: AC
  Filled 2019-12-21: qty 250

## 2019-12-21 NOTE — Progress Notes (Signed)
Pt tolerated Venofer infusion well with no signs of complications. VSS. RN educated pt on the importance of notifying the clinic if any complications occur at home. Pt verbalized understanding. Pt stable for discharge.   [name] [employer]

## 2019-12-22 ENCOUNTER — Other Ambulatory Visit (INDEPENDENT_AMBULATORY_CARE_PROVIDER_SITE_OTHER): Payer: Self-pay | Admitting: Nurse Practitioner

## 2019-12-22 ENCOUNTER — Telehealth (INDEPENDENT_AMBULATORY_CARE_PROVIDER_SITE_OTHER): Payer: Self-pay

## 2019-12-22 ENCOUNTER — Encounter (INDEPENDENT_AMBULATORY_CARE_PROVIDER_SITE_OTHER): Payer: Self-pay

## 2019-12-22 DIAGNOSIS — I10 Essential (primary) hypertension: Secondary | ICD-10-CM | POA: Diagnosis not present

## 2019-12-22 DIAGNOSIS — I2510 Atherosclerotic heart disease of native coronary artery without angina pectoris: Secondary | ICD-10-CM | POA: Diagnosis not present

## 2019-12-22 DIAGNOSIS — G894 Chronic pain syndrome: Secondary | ICD-10-CM | POA: Diagnosis not present

## 2019-12-22 DIAGNOSIS — Z4781 Encounter for orthopedic aftercare following surgical amputation: Secondary | ICD-10-CM | POA: Diagnosis not present

## 2019-12-22 DIAGNOSIS — E538 Deficiency of other specified B group vitamins: Secondary | ICD-10-CM | POA: Diagnosis not present

## 2019-12-22 DIAGNOSIS — E1142 Type 2 diabetes mellitus with diabetic polyneuropathy: Secondary | ICD-10-CM | POA: Diagnosis not present

## 2019-12-22 DIAGNOSIS — D509 Iron deficiency anemia, unspecified: Secondary | ICD-10-CM | POA: Diagnosis not present

## 2019-12-22 DIAGNOSIS — I480 Paroxysmal atrial fibrillation: Secondary | ICD-10-CM | POA: Diagnosis not present

## 2019-12-22 DIAGNOSIS — E559 Vitamin D deficiency, unspecified: Secondary | ICD-10-CM | POA: Diagnosis not present

## 2019-12-22 DIAGNOSIS — E1151 Type 2 diabetes mellitus with diabetic peripheral angiopathy without gangrene: Secondary | ICD-10-CM | POA: Diagnosis not present

## 2019-12-22 MED ORDER — OXYCODONE HCL 5 MG PO TABS: 5.0000 mg | ORAL_TABLET | Freq: Four times a day (QID) | ORAL | 0 refills | Status: DC | PRN

## 2019-12-22 NOTE — Telephone Encounter (Signed)
That is fine 

## 2019-12-22 NOTE — Telephone Encounter (Signed)
[facility] called and wanted to make [place] aware that the Pt canceled  Physical Therapy today  due to being in in pain and also that his wound Vac was to be hooked up today.

## 2019-12-22 NOTE — Telephone Encounter (Signed)
Working with Huey Romans for a wound vac for the patient.

## 2019-12-22 NOTE — Telephone Encounter (Signed)
Can you reach out to see what her question is, today is a bit busy in clinic and I'm not certain I can call

## 2019-12-25 DIAGNOSIS — I480 Paroxysmal atrial fibrillation: Secondary | ICD-10-CM | POA: Diagnosis not present

## 2019-12-25 DIAGNOSIS — D509 Iron deficiency anemia, unspecified: Secondary | ICD-10-CM | POA: Diagnosis not present

## 2019-12-25 DIAGNOSIS — E559 Vitamin D deficiency, unspecified: Secondary | ICD-10-CM | POA: Diagnosis not present

## 2019-12-25 DIAGNOSIS — I2510 Atherosclerotic heart disease of native coronary artery without angina pectoris: Secondary | ICD-10-CM | POA: Diagnosis not present

## 2019-12-25 DIAGNOSIS — E538 Deficiency of other specified B group vitamins: Secondary | ICD-10-CM | POA: Diagnosis not present

## 2019-12-25 DIAGNOSIS — E1142 Type 2 diabetes mellitus with diabetic polyneuropathy: Secondary | ICD-10-CM | POA: Diagnosis not present

## 2019-12-25 DIAGNOSIS — G894 Chronic pain syndrome: Secondary | ICD-10-CM | POA: Diagnosis not present

## 2019-12-25 DIAGNOSIS — Z4781 Encounter for orthopedic aftercare following surgical amputation: Secondary | ICD-10-CM | POA: Diagnosis not present

## 2019-12-25 DIAGNOSIS — I10 Essential (primary) hypertension: Secondary | ICD-10-CM | POA: Diagnosis not present

## 2019-12-25 DIAGNOSIS — E1151 Type 2 diabetes mellitus with diabetic peripheral angiopathy without gangrene: Secondary | ICD-10-CM | POA: Diagnosis not present

## 2019-12-26 DIAGNOSIS — I2510 Atherosclerotic heart disease of native coronary artery without angina pectoris: Secondary | ICD-10-CM | POA: Diagnosis not present

## 2019-12-26 DIAGNOSIS — G894 Chronic pain syndrome: Secondary | ICD-10-CM | POA: Diagnosis not present

## 2019-12-26 DIAGNOSIS — I480 Paroxysmal atrial fibrillation: Secondary | ICD-10-CM | POA: Diagnosis not present

## 2019-12-26 DIAGNOSIS — E538 Deficiency of other specified B group vitamins: Secondary | ICD-10-CM | POA: Diagnosis not present

## 2019-12-26 DIAGNOSIS — D509 Iron deficiency anemia, unspecified: Secondary | ICD-10-CM | POA: Diagnosis not present

## 2019-12-26 DIAGNOSIS — E1142 Type 2 diabetes mellitus with diabetic polyneuropathy: Secondary | ICD-10-CM | POA: Diagnosis not present

## 2019-12-26 DIAGNOSIS — I10 Essential (primary) hypertension: Secondary | ICD-10-CM | POA: Diagnosis not present

## 2019-12-26 DIAGNOSIS — E1151 Type 2 diabetes mellitus with diabetic peripheral angiopathy without gangrene: Secondary | ICD-10-CM | POA: Diagnosis not present

## 2019-12-26 DIAGNOSIS — Z4781 Encounter for orthopedic aftercare following surgical amputation: Secondary | ICD-10-CM | POA: Diagnosis not present

## 2019-12-26 DIAGNOSIS — E559 Vitamin D deficiency, unspecified: Secondary | ICD-10-CM | POA: Diagnosis not present

## 2019-12-27 ENCOUNTER — Telehealth (INDEPENDENT_AMBULATORY_CARE_PROVIDER_SITE_OTHER): Payer: Self-pay

## 2019-12-27 DIAGNOSIS — I480 Paroxysmal atrial fibrillation: Secondary | ICD-10-CM | POA: Diagnosis not present

## 2019-12-27 DIAGNOSIS — I2510 Atherosclerotic heart disease of native coronary artery without angina pectoris: Secondary | ICD-10-CM | POA: Diagnosis not present

## 2019-12-27 DIAGNOSIS — G894 Chronic pain syndrome: Secondary | ICD-10-CM | POA: Diagnosis not present

## 2019-12-27 DIAGNOSIS — E1151 Type 2 diabetes mellitus with diabetic peripheral angiopathy without gangrene: Secondary | ICD-10-CM | POA: Diagnosis not present

## 2019-12-27 DIAGNOSIS — E1142 Type 2 diabetes mellitus with diabetic polyneuropathy: Secondary | ICD-10-CM | POA: Diagnosis not present

## 2019-12-27 DIAGNOSIS — D509 Iron deficiency anemia, unspecified: Secondary | ICD-10-CM | POA: Diagnosis not present

## 2019-12-27 DIAGNOSIS — E538 Deficiency of other specified B group vitamins: Secondary | ICD-10-CM | POA: Diagnosis not present

## 2019-12-27 DIAGNOSIS — Z4781 Encounter for orthopedic aftercare following surgical amputation: Secondary | ICD-10-CM | POA: Diagnosis not present

## 2019-12-27 DIAGNOSIS — E559 Vitamin D deficiency, unspecified: Secondary | ICD-10-CM | POA: Diagnosis not present

## 2019-12-27 DIAGNOSIS — I10 Essential (primary) hypertension: Secondary | ICD-10-CM | POA: Diagnosis not present

## 2019-12-27 NOTE — Telephone Encounter (Signed)
Stephanie from [facility] OT called and said that the pt refused his OT this week  And scheduled  for 2 weeks later.

## 2019-12-28 ENCOUNTER — Telehealth (INDEPENDENT_AMBULATORY_CARE_PROVIDER_SITE_OTHER): Payer: Self-pay

## 2019-12-28 DIAGNOSIS — E1142 Type 2 diabetes mellitus with diabetic polyneuropathy: Secondary | ICD-10-CM | POA: Diagnosis not present

## 2019-12-28 DIAGNOSIS — I2510 Atherosclerotic heart disease of native coronary artery without angina pectoris: Secondary | ICD-10-CM | POA: Diagnosis not present

## 2019-12-28 DIAGNOSIS — E1151 Type 2 diabetes mellitus with diabetic peripheral angiopathy without gangrene: Secondary | ICD-10-CM | POA: Diagnosis not present

## 2019-12-28 DIAGNOSIS — E538 Deficiency of other specified B group vitamins: Secondary | ICD-10-CM | POA: Diagnosis not present

## 2019-12-28 DIAGNOSIS — G894 Chronic pain syndrome: Secondary | ICD-10-CM | POA: Diagnosis not present

## 2019-12-28 DIAGNOSIS — I480 Paroxysmal atrial fibrillation: Secondary | ICD-10-CM | POA: Diagnosis not present

## 2019-12-28 DIAGNOSIS — E559 Vitamin D deficiency, unspecified: Secondary | ICD-10-CM | POA: Diagnosis not present

## 2019-12-28 DIAGNOSIS — D509 Iron deficiency anemia, unspecified: Secondary | ICD-10-CM | POA: Diagnosis not present

## 2019-12-28 DIAGNOSIS — Z4781 Encounter for orthopedic aftercare following surgical amputation: Secondary | ICD-10-CM | POA: Diagnosis not present

## 2019-12-28 DIAGNOSIS — I10 Essential (primary) hypertension: Secondary | ICD-10-CM | POA: Diagnosis not present

## 2019-12-28 NOTE — Telephone Encounter (Addendum)
[facility] nurse Pam left a voicemail stating that on yesterday she had a hard time taking wound vac off patient it took almost 1 hour due to the patient cursing and yelling from pain. Pam is asking if she could apply xeroform or petroleum jelly before applying sponge.I spoke with  Dr Delana Meyer and he recommend that the nurse can use [employer]. Home health nurse has been made aware with medical advice.

## 2019-12-29 ENCOUNTER — Other Ambulatory Visit (INDEPENDENT_AMBULATORY_CARE_PROVIDER_SITE_OTHER): Payer: Self-pay | Admitting: Nurse Practitioner

## 2019-12-29 ENCOUNTER — Telehealth (INDEPENDENT_AMBULATORY_CARE_PROVIDER_SITE_OTHER): Payer: Self-pay

## 2019-12-29 DIAGNOSIS — Z4781 Encounter for orthopedic aftercare following surgical amputation: Secondary | ICD-10-CM | POA: Diagnosis not present

## 2019-12-29 DIAGNOSIS — E538 Deficiency of other specified B group vitamins: Secondary | ICD-10-CM | POA: Diagnosis not present

## 2019-12-29 DIAGNOSIS — I480 Paroxysmal atrial fibrillation: Secondary | ICD-10-CM | POA: Diagnosis not present

## 2019-12-29 DIAGNOSIS — E1151 Type 2 diabetes mellitus with diabetic peripheral angiopathy without gangrene: Secondary | ICD-10-CM | POA: Diagnosis not present

## 2019-12-29 DIAGNOSIS — E1142 Type 2 diabetes mellitus with diabetic polyneuropathy: Secondary | ICD-10-CM | POA: Diagnosis not present

## 2019-12-29 DIAGNOSIS — G894 Chronic pain syndrome: Secondary | ICD-10-CM | POA: Diagnosis not present

## 2019-12-29 DIAGNOSIS — I2510 Atherosclerotic heart disease of native coronary artery without angina pectoris: Secondary | ICD-10-CM | POA: Diagnosis not present

## 2019-12-29 DIAGNOSIS — I10 Essential (primary) hypertension: Secondary | ICD-10-CM | POA: Diagnosis not present

## 2019-12-29 DIAGNOSIS — E559 Vitamin D deficiency, unspecified: Secondary | ICD-10-CM | POA: Diagnosis not present

## 2019-12-29 DIAGNOSIS — D509 Iron deficiency anemia, unspecified: Secondary | ICD-10-CM | POA: Diagnosis not present

## 2019-12-29 MED ORDER — OXYCODONE HCL 5 MG PO TABS: 5.0000 mg | ORAL_TABLET | Freq: Four times a day (QID) | ORAL | 0 refills | Status: AC | PRN

## 2019-12-29 NOTE — Telephone Encounter (Signed)
It's done

## 2019-12-29 NOTE — Telephone Encounter (Signed)
The pts  Wife  Called and wanted to know if we could refill her husbands oxycodone pain medicine and send it to [facility] in [place].

## 2020-01-01 ENCOUNTER — Other Ambulatory Visit: Payer: Self-pay

## 2020-01-01 ENCOUNTER — Inpatient Hospital Stay
Admission: EM | Admit: 2020-01-01 | Discharge: 2020-02-01 | DRG: 474 | Disposition: E | Payer: Medicare HMO | Attending: Internal Medicine | Admitting: Internal Medicine

## 2020-01-01 ENCOUNTER — Emergency Department: Payer: Medicare HMO

## 2020-01-01 DIAGNOSIS — Z8249 Family history of ischemic heart disease and other diseases of the circulatory system: Secondary | ICD-10-CM

## 2020-01-01 DIAGNOSIS — E1142 Type 2 diabetes mellitus with diabetic polyneuropathy: Secondary | ICD-10-CM | POA: Diagnosis present

## 2020-01-01 DIAGNOSIS — R0989 Other specified symptoms and signs involving the circulatory and respiratory systems: Secondary | ICD-10-CM | POA: Diagnosis not present

## 2020-01-01 DIAGNOSIS — Z89512 Acquired absence of left leg below knee: Secondary | ICD-10-CM

## 2020-01-01 DIAGNOSIS — E861 Hypovolemia: Secondary | ICD-10-CM | POA: Diagnosis not present

## 2020-01-01 DIAGNOSIS — Z9911 Dependence on respirator [ventilator] status: Secondary | ICD-10-CM | POA: Diagnosis not present

## 2020-01-01 DIAGNOSIS — R578 Other shock: Secondary | ICD-10-CM | POA: Diagnosis not present

## 2020-01-01 DIAGNOSIS — T8789 Other complications of amputation stump: Secondary | ICD-10-CM | POA: Diagnosis not present

## 2020-01-01 DIAGNOSIS — M79606 Pain in leg, unspecified: Secondary | ICD-10-CM | POA: Diagnosis present

## 2020-01-01 DIAGNOSIS — L03115 Cellulitis of right lower limb: Secondary | ICD-10-CM | POA: Diagnosis present

## 2020-01-01 DIAGNOSIS — K56600 Partial intestinal obstruction, unspecified as to cause: Secondary | ICD-10-CM | POA: Diagnosis present

## 2020-01-01 DIAGNOSIS — E559 Vitamin D deficiency, unspecified: Secondary | ICD-10-CM | POA: Diagnosis present

## 2020-01-01 DIAGNOSIS — E782 Mixed hyperlipidemia: Secondary | ICD-10-CM | POA: Diagnosis present

## 2020-01-01 DIAGNOSIS — H548 Legal blindness, as defined in USA: Secondary | ICD-10-CM | POA: Diagnosis present

## 2020-01-01 DIAGNOSIS — I4821 Permanent atrial fibrillation: Secondary | ICD-10-CM | POA: Diagnosis present

## 2020-01-01 DIAGNOSIS — T8149XA Infection following a procedure, other surgical site, initial encounter: Secondary | ICD-10-CM

## 2020-01-01 DIAGNOSIS — Z89611 Acquired absence of right leg above knee: Secondary | ICD-10-CM

## 2020-01-01 DIAGNOSIS — R6520 Severe sepsis without septic shock: Secondary | ICD-10-CM

## 2020-01-01 DIAGNOSIS — B1910 Unspecified viral hepatitis B without hepatic coma: Secondary | ICD-10-CM | POA: Diagnosis present

## 2020-01-01 DIAGNOSIS — E871 Hypo-osmolality and hyponatremia: Secondary | ICD-10-CM | POA: Diagnosis not present

## 2020-01-01 DIAGNOSIS — Z4682 Encounter for fitting and adjustment of non-vascular catheter: Secondary | ICD-10-CM | POA: Diagnosis not present

## 2020-01-01 DIAGNOSIS — I2510 Atherosclerotic heart disease of native coronary artery without angina pectoris: Secondary | ICD-10-CM | POA: Diagnosis present

## 2020-01-01 DIAGNOSIS — I10 Essential (primary) hypertension: Secondary | ICD-10-CM | POA: Diagnosis not present

## 2020-01-01 DIAGNOSIS — R7881 Bacteremia: Secondary | ICD-10-CM | POA: Diagnosis not present

## 2020-01-01 DIAGNOSIS — Z888 Allergy status to other drugs, medicaments and biological substances status: Secondary | ICD-10-CM

## 2020-01-01 DIAGNOSIS — Z7984 Long term (current) use of oral hypoglycemic drugs: Secondary | ICD-10-CM

## 2020-01-01 DIAGNOSIS — Y835 Amputation of limb(s) as the cause of abnormal reaction of the patient, or of later complication, without mention of misadventure at the time of the procedure: Secondary | ICD-10-CM | POA: Diagnosis not present

## 2020-01-01 DIAGNOSIS — K7031 Alcoholic cirrhosis of liver with ascites: Secondary | ICD-10-CM | POA: Diagnosis present

## 2020-01-01 DIAGNOSIS — J9600 Acute respiratory failure, unspecified whether with hypoxia or hypercapnia: Secondary | ICD-10-CM

## 2020-01-01 DIAGNOSIS — E1152 Type 2 diabetes mellitus with diabetic peripheral angiopathy with gangrene: Secondary | ICD-10-CM | POA: Diagnosis present

## 2020-01-01 DIAGNOSIS — R6521 Severe sepsis with septic shock: Secondary | ICD-10-CM | POA: Diagnosis not present

## 2020-01-01 DIAGNOSIS — R69 Illness, unspecified: Secondary | ICD-10-CM | POA: Diagnosis not present

## 2020-01-01 DIAGNOSIS — D649 Anemia, unspecified: Secondary | ICD-10-CM | POA: Diagnosis present

## 2020-01-01 DIAGNOSIS — Z87891 Personal history of nicotine dependence: Secondary | ICD-10-CM

## 2020-01-01 DIAGNOSIS — D689 Coagulation defect, unspecified: Secondary | ICD-10-CM | POA: Diagnosis present

## 2020-01-01 DIAGNOSIS — R000 Tachycardia, unspecified: Secondary | ICD-10-CM | POA: Diagnosis present

## 2020-01-01 DIAGNOSIS — Z4659 Encounter for fitting and adjustment of other gastrointestinal appliance and device: Secondary | ICD-10-CM

## 2020-01-01 DIAGNOSIS — Z66 Do not resuscitate: Secondary | ICD-10-CM | POA: Diagnosis not present

## 2020-01-01 DIAGNOSIS — I4891 Unspecified atrial fibrillation: Secondary | ICD-10-CM | POA: Diagnosis not present

## 2020-01-01 DIAGNOSIS — E1151 Type 2 diabetes mellitus with diabetic peripheral angiopathy without gangrene: Secondary | ICD-10-CM | POA: Diagnosis not present

## 2020-01-01 DIAGNOSIS — A419 Sepsis, unspecified organism: Secondary | ICD-10-CM

## 2020-01-01 DIAGNOSIS — T8743 Infection of amputation stump, right lower extremity: Secondary | ICD-10-CM | POA: Diagnosis not present

## 2020-01-01 DIAGNOSIS — D62 Acute posthemorrhagic anemia: Secondary | ICD-10-CM | POA: Diagnosis present

## 2020-01-01 DIAGNOSIS — Z20822 Contact with and (suspected) exposure to covid-19: Secondary | ICD-10-CM | POA: Diagnosis present

## 2020-01-01 DIAGNOSIS — I77810 Thoracic aortic ectasia: Secondary | ICD-10-CM | POA: Diagnosis present

## 2020-01-01 DIAGNOSIS — Z833 Family history of diabetes mellitus: Secondary | ICD-10-CM

## 2020-01-01 DIAGNOSIS — Z83438 Family history of other disorder of lipoprotein metabolism and other lipidemia: Secondary | ICD-10-CM

## 2020-01-01 DIAGNOSIS — K5520 Angiodysplasia of colon without hemorrhage: Secondary | ICD-10-CM | POA: Diagnosis present

## 2020-01-01 DIAGNOSIS — Z7901 Long term (current) use of anticoagulants: Secondary | ICD-10-CM

## 2020-01-01 DIAGNOSIS — Z452 Encounter for adjustment and management of vascular access device: Secondary | ICD-10-CM

## 2020-01-01 DIAGNOSIS — K219 Gastro-esophageal reflux disease without esophagitis: Secondary | ICD-10-CM | POA: Diagnosis not present

## 2020-01-01 DIAGNOSIS — R509 Fever, unspecified: Secondary | ICD-10-CM

## 2020-01-01 DIAGNOSIS — K7290 Hepatic failure, unspecified without coma: Secondary | ICD-10-CM | POA: Diagnosis present

## 2020-01-01 DIAGNOSIS — B9562 Methicillin resistant Staphylococcus aureus infection as the cause of diseases classified elsewhere: Secondary | ICD-10-CM | POA: Diagnosis not present

## 2020-01-01 DIAGNOSIS — R404 Transient alteration of awareness: Secondary | ICD-10-CM | POA: Diagnosis not present

## 2020-01-01 DIAGNOSIS — J9601 Acute respiratory failure with hypoxia: Secondary | ICD-10-CM | POA: Diagnosis not present

## 2020-01-01 DIAGNOSIS — Z8673 Personal history of transient ischemic attack (TIA), and cerebral infarction without residual deficits: Secondary | ICD-10-CM

## 2020-01-01 DIAGNOSIS — Z978 Presence of other specified devices: Secondary | ICD-10-CM

## 2020-01-01 DIAGNOSIS — R918 Other nonspecific abnormal finding of lung field: Secondary | ICD-10-CM | POA: Diagnosis present

## 2020-01-01 DIAGNOSIS — Z79899 Other long term (current) drug therapy: Secondary | ICD-10-CM

## 2020-01-01 DIAGNOSIS — J811 Chronic pulmonary edema: Secondary | ICD-10-CM | POA: Diagnosis not present

## 2020-01-01 DIAGNOSIS — G9340 Encephalopathy, unspecified: Secondary | ICD-10-CM | POA: Diagnosis not present

## 2020-01-01 DIAGNOSIS — A4102 Sepsis due to Methicillin resistant Staphylococcus aureus: Secondary | ICD-10-CM | POA: Diagnosis present

## 2020-01-01 DIAGNOSIS — T8189XA Other complications of procedures, not elsewhere classified, initial encounter: Secondary | ICD-10-CM | POA: Diagnosis not present

## 2020-01-01 DIAGNOSIS — Z515 Encounter for palliative care: Secondary | ICD-10-CM | POA: Diagnosis not present

## 2020-01-01 DIAGNOSIS — Z7189 Other specified counseling: Secondary | ICD-10-CM

## 2020-01-01 LAB — LACTIC ACID, PLASMA
Lactic Acid, Venous: 1.2 mmol/L (ref 0.5–1.9)
Lactic Acid, Venous: 1 mmol/L (ref 0.5–1.9)

## 2020-01-01 LAB — BLOOD CULTURE ID PANEL (REFLEXED)

## 2020-01-01 LAB — URINALYSIS, COMPLETE (UACMP) WITH MICROSCOPIC
Bacteria, UA: NONE SEEN
Bilirubin Urine: NEGATIVE
Glucose, UA: >=500 mg/dL — AB
Hgb urine dipstick: NEGATIVE
Ketones, ur: NEGATIVE mg/dL
Leukocytes,Ua: NEGATIVE
Nitrite: NEGATIVE
Protein, ur: 30 mg/dL — AB
Specific Gravity, Urine: 1.033 — ABNORMAL HIGH (ref 1.005–1.030)
Squamous Epithelial / HPF: NONE SEEN (ref 0–5)
pH: 5 (ref 5.0–8.0)

## 2020-01-01 LAB — COMPREHENSIVE METABOLIC PANEL
ALT: 14 U/L (ref 0–44)
AST: 18 U/L (ref 15–41)
Albumin: 3.4 g/dL — ABNORMAL LOW (ref 3.5–5.0)
Alkaline Phosphatase: 105 U/L (ref 38–126)
Anion gap: 10 (ref 5–15)
BUN: 18 mg/dL (ref 8–23)
CO2: 23 mmol/L (ref 22–32)
Calcium: 8.9 mg/dL (ref 8.9–10.3)
Chloride: 99 mmol/L (ref 98–111)
Creatinine, Ser: 0.69 mg/dL (ref 0.61–1.24)
GFR calc Af Amer: >60 mL/min (ref >60)
GFR calc non Af Amer: >60 mL/min (ref >60)
Glucose, Bld: 158 mg/dL — ABNORMAL HIGH (ref 70–99)
Potassium: 3.8 mmol/L (ref 3.5–5.1)
Sodium: 132 mmol/L — ABNORMAL LOW (ref 135–145)
Total Bilirubin: 1.1 mg/dL (ref 0.3–1.2)
Total Protein: 7.3 g/dL (ref 6.5–8.1)

## 2020-01-01 LAB — CBC WITH DIFFERENTIAL/PLATELET
Abs Immature Granulocytes: 0.07 10*3/uL (ref 0.00–0.07)
Basophils Absolute: 0.1 10*3/uL (ref 0.0–0.1)
Basophils Relative: 1 %
Eosinophils Absolute: 0.3 10*3/uL (ref 0.0–0.5)
Eosinophils Relative: 3 %
HCT: 29.1 % — ABNORMAL LOW (ref 39.0–52.0)
Hemoglobin: 9.3 g/dL — ABNORMAL LOW (ref 13.0–17.0)
Immature Granulocytes: 1 %
Lymphocytes Relative: 8 %
Lymphs Abs: 1 10*3/uL (ref 0.7–4.0)
MCH: 25.3 pg — ABNORMAL LOW (ref 26.0–34.0)
MCHC: 32 g/dL (ref 30.0–36.0)
MCV: 79.3 fL — ABNORMAL LOW (ref 80.0–100.0)
Monocytes Absolute: 1.5 10*3/uL — ABNORMAL HIGH (ref 0.1–1.0)
Monocytes Relative: 12 %
Neutro Abs: 9.9 10*3/uL — ABNORMAL HIGH (ref 1.7–7.7)
Neutrophils Relative %: 75 %
Platelets: 229 10*3/uL (ref 150–400)
RBC: 3.67 MIL/uL — ABNORMAL LOW (ref 4.22–5.81)
RDW: 17.6 % — ABNORMAL HIGH (ref 11.5–15.5)
WBC: 12.8 10*3/uL — ABNORMAL HIGH (ref 4.0–10.5)
nRBC: 0 % (ref 0.0–0.2)

## 2020-01-01 LAB — GLUCOSE, CAPILLARY
Glucose-Capillary: 142 mg/dL — ABNORMAL HIGH (ref 70–99)
Glucose-Capillary: 142 mg/dL — ABNORMAL HIGH (ref 70–99)
Glucose-Capillary: 161 mg/dL — ABNORMAL HIGH (ref 70–99)
Glucose-Capillary: 162 mg/dL — ABNORMAL HIGH (ref 70–99)

## 2020-01-01 LAB — CBC
HCT: 26.9 % — ABNORMAL LOW (ref 39.0–52.0)
HCT: 21.1 % — ABNORMAL LOW (ref 39.0–52.0)
Hemoglobin: 8.3 g/dL — ABNORMAL LOW (ref 13.0–17.0)
Hemoglobin: 6.4 g/dL — ABNORMAL LOW (ref 13.0–17.0)
MCH: 25.2 pg — ABNORMAL LOW (ref 26.0–34.0)
MCH: 24.9 pg — ABNORMAL LOW (ref 26.0–34.0)
MCHC: 30.9 g/dL (ref 30.0–36.0)
MCHC: 30.3 g/dL (ref 30.0–36.0)
MCV: 81.5 fL (ref 80.0–100.0)
MCV: 82.1 fL (ref 80.0–100.0)
Platelets: 223 10*3/uL (ref 150–400)
Platelets: 202 10*3/uL (ref 150–400)
RBC: 3.3 MIL/uL — ABNORMAL LOW (ref 4.22–5.81)
RBC: 2.57 MIL/uL — ABNORMAL LOW (ref 4.22–5.81)
RDW: 17.7 % — ABNORMAL HIGH (ref 11.5–15.5)
RDW: 17.4 % — ABNORMAL HIGH (ref 11.5–15.5)
WBC: 13.8 10*3/uL — ABNORMAL HIGH (ref 4.0–10.5)
WBC: 12.7 10*3/uL — ABNORMAL HIGH (ref 4.0–10.5)
nRBC: 0 % (ref 0.0–0.2)
nRBC: 0 % (ref 0.0–0.2)

## 2020-01-01 LAB — TROPONIN I (HIGH SENSITIVITY)
Troponin I (High Sensitivity): 51 ng/L — ABNORMAL HIGH (ref <18)
Troponin I (High Sensitivity): 50 ng/L — ABNORMAL HIGH (ref <18)

## 2020-01-01 LAB — HEMOGLOBIN A1C
Hgb A1c MFr Bld: 6.3 % — ABNORMAL HIGH (ref 4.8–5.6)
Mean Plasma Glucose: 134.11 mg/dL

## 2020-01-01 LAB — SARS CORONAVIRUS 2 BY RT PCR (HOSPITAL ORDER, PERFORMED IN ~~LOC~~ HOSPITAL LAB): SARS Coronavirus 2: NEGATIVE

## 2020-01-01 LAB — AMMONIA: Ammonia: 21 umol/L (ref 9–35)

## 2020-01-01 LAB — PREPARE RBC (CROSSMATCH)

## 2020-01-01 LAB — PROTIME-INR
INR: 2.8 — ABNORMAL HIGH (ref 0.8–1.2)
Prothrombin Time: 28.2 seconds — ABNORMAL HIGH (ref 11.4–15.2)

## 2020-01-01 IMAGING — DX DG CHEST 1V PORT
2 series · 2 of 2 positions shown · non-contrast
Comparison: November 11, 2019

CLINICAL DATA: Fever

EXAM:
PORTABLE CHEST 1 VIEW

[chest ap (1 of 2)]
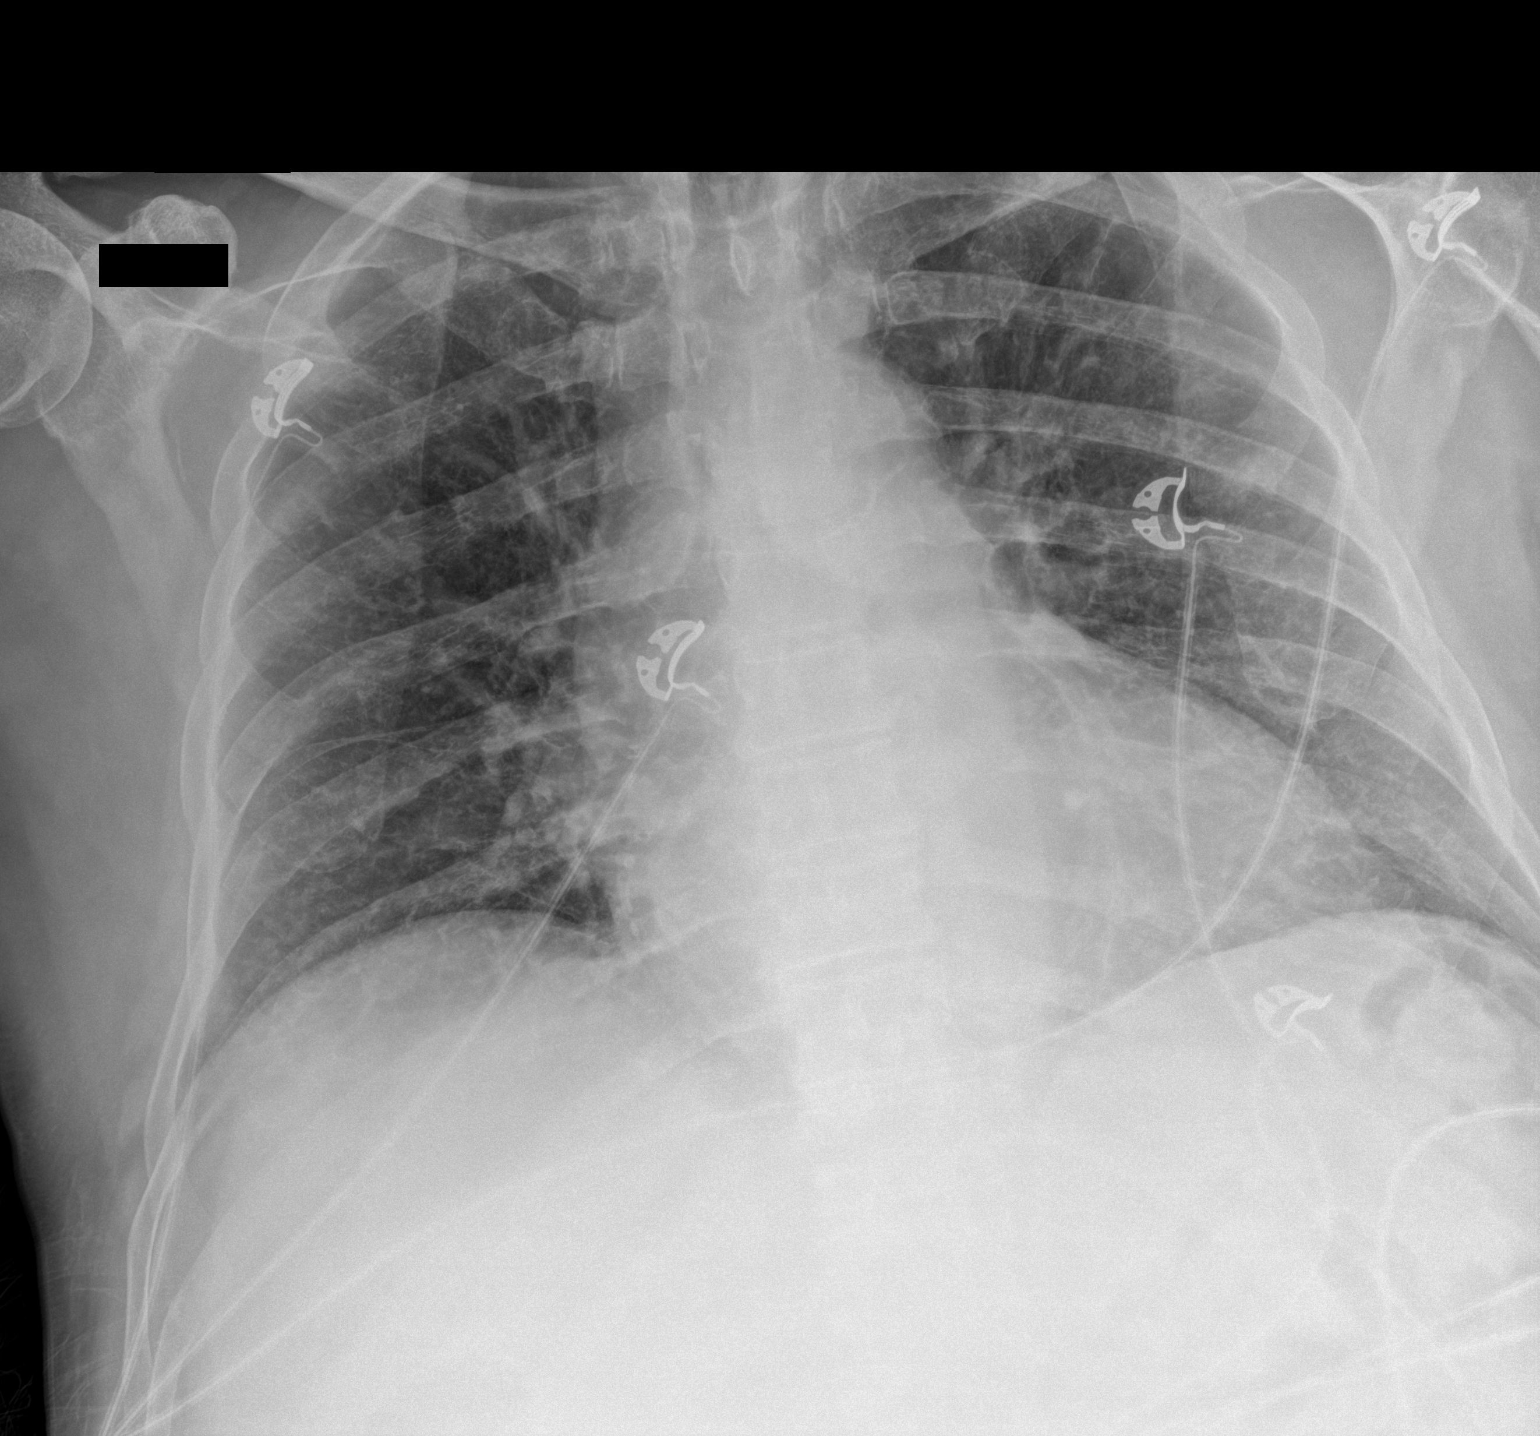

[chest ap (2 of 2)]
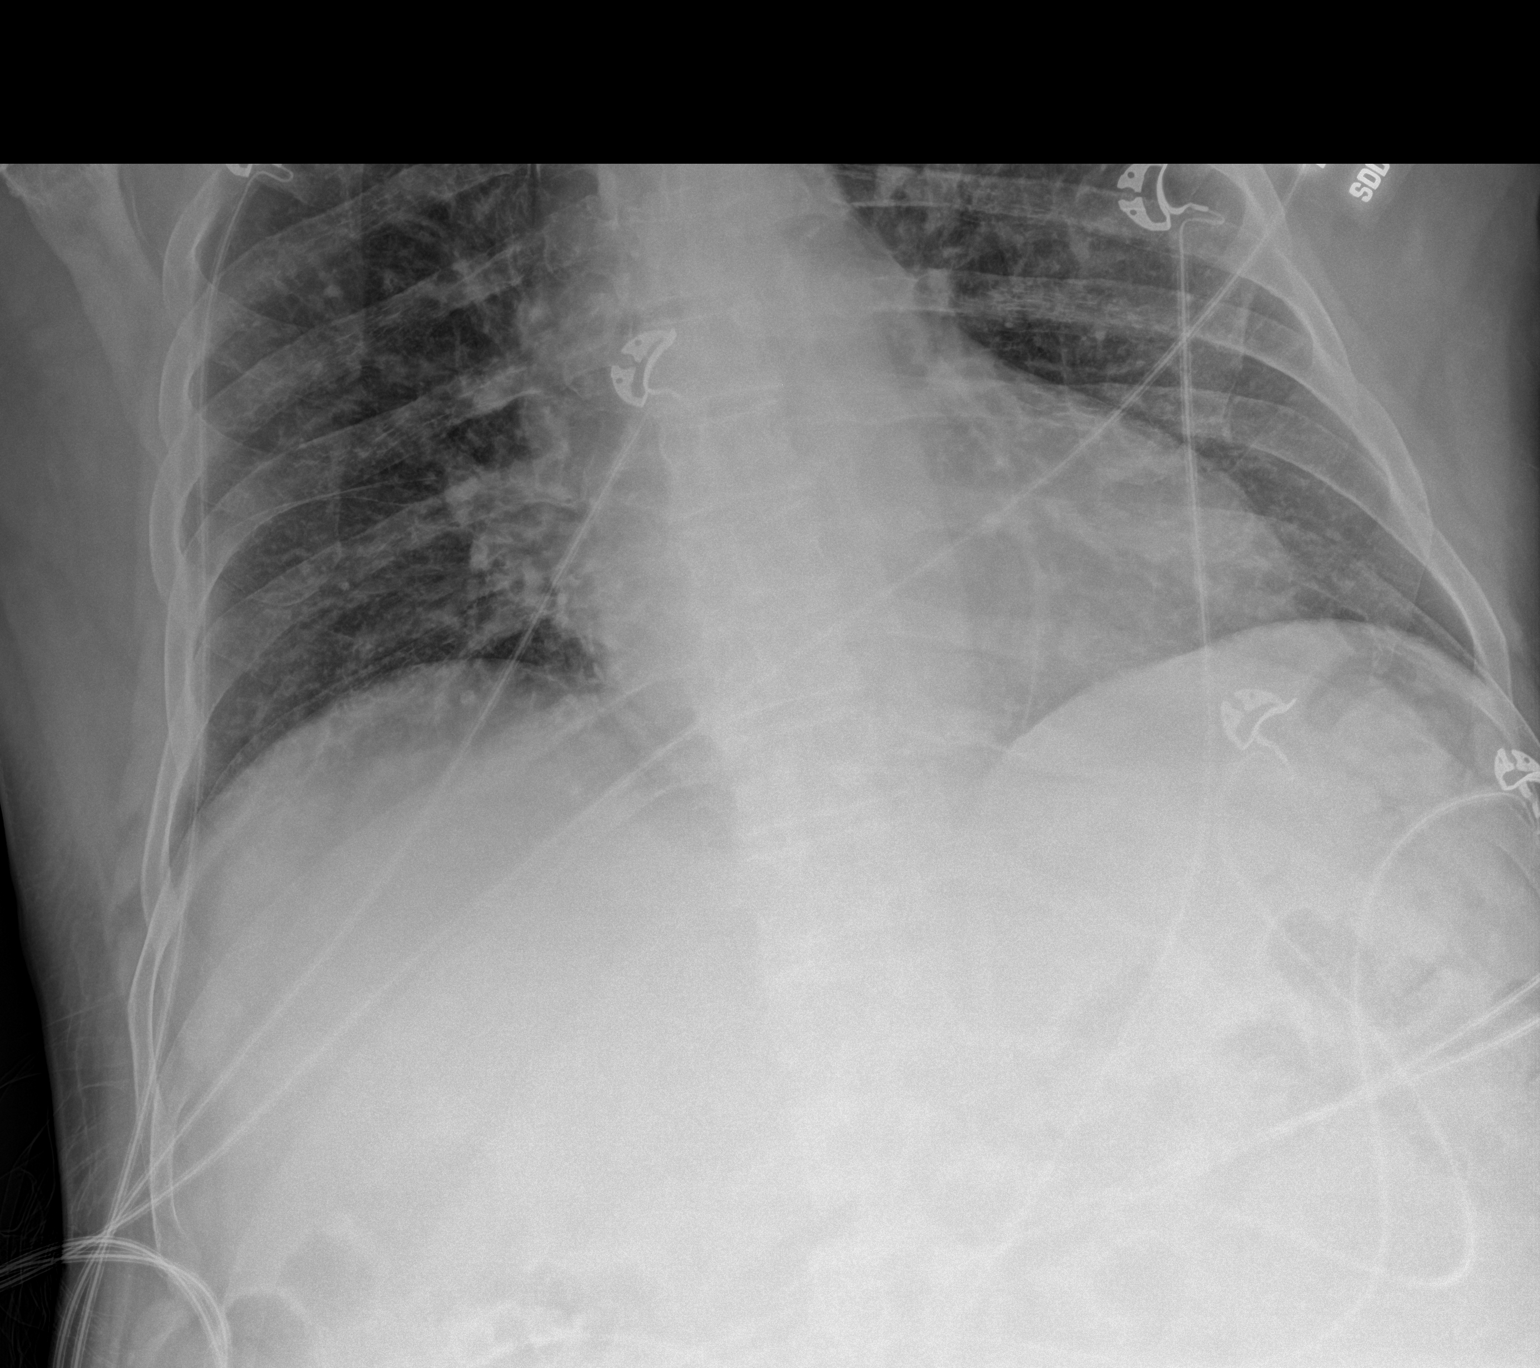

[2 of 2 positions shown; findings below may reference images not displayed]

FINDINGS: There is mild cardiomegaly. There is slight prominence of the
central pulmonary vasculature. No large airspace consolidation or
pleural effusion. No acute osseous abnormality.
IMPRESSION: Mild cardiomegaly and pulmonary vascular congestion.

## 2020-01-01 MED ORDER — HYDROMORPHONE HCL 1 MG/ML IJ SOLN
INTRAMUSCULAR | Status: AC
  Administered 2020-01-01: 1 mg via INTRAVENOUS
  Filled 2020-01-01: qty 2

## 2020-01-01 MED ORDER — VANCOMYCIN HCL 750 MG/150ML IV SOLN
750.0000 mg | Freq: Three times a day (TID) | INTRAVENOUS | Status: DC
  Administered 2020-01-02: 750 mg via INTRAVENOUS
  Filled 2020-01-01 (×4): qty 150

## 2020-01-01 MED ORDER — KETOROLAC TROMETHAMINE 30 MG/ML IJ SOLN
15.0000 mg | Freq: Once | INTRAMUSCULAR | Status: AC
  Administered 2020-01-01: 15 mg via INTRAVENOUS
  Filled 2020-01-01: qty 1

## 2020-01-01 MED ORDER — FENTANYL CITRATE (PF) 100 MCG/2ML IJ SOLN
50.0000 ug | Freq: Once | INTRAMUSCULAR | Status: AC
  Administered 2020-01-01: 50 ug via INTRAVENOUS

## 2020-01-01 MED ORDER — FENTANYL CITRATE (PF) 100 MCG/2ML IJ SOLN
25.0000 ug | Freq: Once | INTRAMUSCULAR | Status: AC
  Administered 2020-01-01: 25 ug via INTRAVENOUS
  Filled 2020-01-01: qty 2

## 2020-01-01 MED ORDER — METRONIDAZOLE IN NACL 5-0.79 MG/ML-% IV SOLN
500.0000 mg | Freq: Three times a day (TID) | INTRAVENOUS | Status: DC
  Administered 2020-01-01: 500 mg via INTRAVENOUS
  Filled 2020-01-01 (×4): qty 100

## 2020-01-01 MED ORDER — SODIUM CHLORIDE 0.9% IV SOLUTION: Freq: Once | INTRAVENOUS | Status: DC

## 2020-01-01 MED ORDER — SODIUM CHLORIDE 0.9 % IV BOLUS
1000.0000 mL | Freq: Once | INTRAVENOUS | Status: AC
  Administered 2020-01-01: 1000 mL via INTRAVENOUS

## 2020-01-01 MED ORDER — LORAZEPAM 0.5 MG PO TABS
0.2500 mg | ORAL_TABLET | Freq: Once | ORAL | Status: AC
  Administered 2020-01-01: 0.25 mg via ORAL
  Filled 2020-01-01: qty 1

## 2020-01-01 MED ORDER — SODIUM CHLORIDE 0.9 % IV SOLN
2.0000 g | Freq: Three times a day (TID) | INTRAVENOUS | Status: DC
  Administered 2020-01-01: 2 g via INTRAVENOUS
  Filled 2020-01-01 (×4): qty 2

## 2020-01-01 MED ORDER — HYDROMORPHONE HCL 1 MG/ML IJ SOLN
1.0000 mg | INTRAMUSCULAR | Status: DC | PRN
  Filled 2020-01-01: qty 1

## 2020-01-01 MED ORDER — SODIUM CHLORIDE 0.9 % IV BOLUS (SEPSIS)
250.0000 mL | Freq: Once | INTRAVENOUS | Status: AC
  Administered 2020-01-01: 250 mL via INTRAVENOUS

## 2020-01-01 MED ORDER — SODIUM CHLORIDE 0.9 % IV BOLUS (SEPSIS): 1000.0000 mL | Freq: Once | INTRAVENOUS | Status: DC

## 2020-01-01 MED ORDER — INSULIN ASPART 100 UNIT/ML ~~LOC~~ SOLN
0.0000 [IU] | Freq: Three times a day (TID) | SUBCUTANEOUS | Status: DC
  Administered 2020-01-01 (×2): 1 [IU] via SUBCUTANEOUS
  Administered 2020-01-02: 2 [IU] via SUBCUTANEOUS
  Administered 2020-01-02 (×2): 3 [IU] via SUBCUTANEOUS
  Administered 2020-01-03: 2 [IU] via SUBCUTANEOUS
  Administered 2020-01-03 (×2): 3 [IU] via SUBCUTANEOUS
  Administered 2020-01-04: 2 [IU] via SUBCUTANEOUS
  Filled 2020-01-01 (×9): qty 1

## 2020-01-01 MED ORDER — SODIUM CHLORIDE 0.9 % IV SOLN
INTRAVENOUS | Status: DC
  Administered 2020-01-01 – 2020-01-04 (×3): 75 mL/h via INTRAVENOUS

## 2020-01-01 MED ORDER — HEPARIN SODIUM (PORCINE) 5000 UNIT/ML IJ SOLN
5000.0000 [IU] | Freq: Three times a day (TID) | INTRAMUSCULAR | Status: DC
  Administered 2020-01-01 (×2): 5000 [IU] via SUBCUTANEOUS
  Filled 2020-01-01 (×2): qty 1

## 2020-01-01 MED ORDER — ONDANSETRON HCL 4 MG PO TABS: 4.0000 mg | ORAL_TABLET | Freq: Four times a day (QID) | ORAL | Status: DC | PRN

## 2020-01-01 MED ORDER — HYDROCODONE-ACETAMINOPHEN 5-325 MG PO TABS
1.0000 | ORAL_TABLET | ORAL | Status: DC | PRN
  Administered 2020-01-01: 1 via ORAL
  Administered 2020-01-01: 2 via ORAL
  Filled 2020-01-01: qty 1
  Filled 2020-01-01: qty 2

## 2020-01-01 MED ORDER — HYDROMORPHONE HCL 1 MG/ML IJ SOLN
2.0000 mg | Freq: Once | INTRAMUSCULAR | Status: AC
  Administered 2020-01-01: 2 mg via INTRAVENOUS

## 2020-01-01 MED ORDER — ACETAMINOPHEN 325 MG PO TABS: 650.0000 mg | ORAL_TABLET | Freq: Four times a day (QID) | ORAL | Status: DC | PRN

## 2020-01-01 MED ORDER — KETOROLAC TROMETHAMINE 30 MG/ML IJ SOLN
15.0000 mg | Freq: Four times a day (QID) | INTRAMUSCULAR | Status: DC | PRN
  Administered 2020-01-01: 15 mg via INTRAVENOUS
  Filled 2020-01-01: qty 1

## 2020-01-01 MED ORDER — SODIUM CHLORIDE 0.9 % IV SOLN
2.0000 g | Freq: Once | INTRAVENOUS | Status: AC
  Administered 2020-01-01: 2 g via INTRAVENOUS
  Filled 2020-01-01: qty 2

## 2020-01-01 MED ORDER — ACETAMINOPHEN 650 MG RE SUPP: 650.0000 mg | Freq: Four times a day (QID) | RECTAL | Status: DC | PRN

## 2020-01-01 MED ORDER — VANCOMYCIN HCL 1500 MG/300ML IV SOLN
1500.0000 mg | Freq: Once | INTRAVENOUS | Status: AC
  Administered 2020-01-01: 1500 mg via INTRAVENOUS
  Filled 2020-01-01: qty 300

## 2020-01-01 MED ORDER — MORPHINE SULFATE (PF) 4 MG/ML IV SOLN
4.0000 mg | Freq: Once | INTRAVENOUS | Status: AC
  Administered 2020-01-01: 4 mg via INTRAVENOUS
  Filled 2020-01-01: qty 1

## 2020-01-01 MED ORDER — INSULIN ASPART 100 UNIT/ML ~~LOC~~ SOLN
0.0000 [IU] | Freq: Every day | SUBCUTANEOUS | Status: DC
  Administered 2020-01-02 – 2020-01-03 (×2): 2 [IU] via SUBCUTANEOUS
  Filled 2020-01-01 (×2): qty 1

## 2020-01-01 MED ORDER — FENTANYL CITRATE (PF) 100 MCG/2ML IJ SOLN
INTRAMUSCULAR | Status: AC
  Filled 2020-01-01: qty 2

## 2020-01-01 MED ORDER — VANCOMYCIN HCL IN DEXTROSE 1-5 GM/200ML-% IV SOLN
1000.0000 mg | Freq: Once | INTRAVENOUS | Status: AC
  Administered 2020-01-01: 1000 mg via INTRAVENOUS
  Filled 2020-01-01: qty 200

## 2020-01-01 MED ORDER — METRONIDAZOLE IN NACL 5-0.79 MG/ML-% IV SOLN
500.0000 mg | Freq: Once | INTRAVENOUS | Status: AC
  Administered 2020-01-01: 500 mg via INTRAVENOUS
  Filled 2020-01-01: qty 100

## 2020-01-01 MED ORDER — ONDANSETRON HCL 4 MG/2ML IJ SOLN: 4.0000 mg | Freq: Four times a day (QID) | INTRAMUSCULAR | Status: DC | PRN

## 2020-01-01 MED ORDER — SODIUM CHLORIDE 0.9 % IV BOLUS (SEPSIS)
1000.0000 mL | Freq: Once | INTRAVENOUS | Status: AC
  Administered 2020-01-01: 1000 mL via INTRAVENOUS

## 2020-01-01 NOTE — Progress Notes (Signed)
Patient ID: [name]., male   DOB: [birth year], 64 y.o.   MRN: [id number] Called nursing that patient was bleeding from his wound area where the wound VAC was removed earlier.  He has soaked the sheets and some towels.  They got it to stop.  CBC is now being drawn.  Patient is moaning from pain however is complaining of pain from the hand or trying to obtain blood draws.  Will give pain medication x1 IV.  We will follow up on CBC.  Told rapid response nursing if patient continues to rebleed to contact vascular may need to replace the wound VAC.

## 2020-01-01 NOTE — Progress Notes (Signed)
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

## 2020-01-01 NOTE — Progress Notes (Signed)
Pharmacy Antibiotic Note  [name]. is a 64 y.o. male admitted on 12/18/2019 with sepsis.  Pharmacy has been consulted for Cefepime dosing.  Plan: Cefepime 2gm IV q8hrs  Height: 5\' 9"  (175.3 cm) Weight: 68 kg (149 lb 14.6 oz) IBW/kg (Calculated) : 70.7  Temp (24hrs), Avg:100.4 F (38 C), Min:98.9 F (37.2 C), Max:101.8 F (38.8 C)  Recent Labs  Lab 12/28/2019 0358 12/15/2019 0545  WBC 12.8*  --   CREATININE 0.69  --   LATICACIDVEN 1.2 1.0    Estimated Creatinine Clearance: 88.5 mL/min (by C-G formula based on SCr of 0.69 mg/dL).    Allergies  Allergen Reactions  . Sodium Pentobarbital [Pentobarbital] Shortness Of Breath  . Lipitor [Atorvastatin] Rash    Antimicrobials this admission:   >>    >>   Dose adjustments this admission:   Microbiology results:  BCx:   UCx:    Sputum:    MRSA PCR:   Thank you for allowing pharmacy to be a part of this patient's care.  Hart Robinsons A 12/15/2019 6:20 AM

## 2020-01-01 NOTE — Progress Notes (Signed)
PT began to bleed from amputation site profusely. Charge nurse at bedside - this nurse and charge began applying abds and pressure until bleeding slowed.  Site was then wrapped with two ACE bandages. PT still in a lot of pain there was trouble getting appropriate MD on phone for further orders.  At 23 Dr. Kurtis Bushman responded to page and gave orders for IV pain medication, ativan, SCDs to LLE, CBC and to monitor R wound site if bleeding continues notify vascular. PRN ordered Meds administered.  Pt still yelled out for sometime.  Wife at bedside.  Pt has seemed to settle some at this time.  Will continue to monitor.

## 2020-01-01 NOTE — ED Provider Notes (Signed)
[facility] Emergency Department Provider Note   ____________________________________________   First MD Initiated Contact with Patient 12/16/2019 [phone]     (approximate)  I have reviewed the triage vital signs and the nursing notes.   HISTORY  Chief Complaint Fever (Right AKA March )    HPI [name]. is a 64 y.o. male who presents to the ED from home with a chief complaint of fever.  Patient has a history of CVA, diabetes, atrial fibrillation on Eliquis, alcoholic cirrhosis, AKA in March with wound VAC who presents with fever 101 F at home.  Wife gave 2 oxycodone and 2 ibuprofen approximately 12:30 AM.  Patient denies pain but arrives moaning and groaning.  Wife denies cough, chest pain, shortness of breath, abdominal pain, nausea, vomiting or dysuria.  States patient had 2 loose bowel movements.       Past Medical History:  Diagnosis Date  . Acute left PCA stroke ([place]) 02/17/2015  . Anemia    vitamin b and vit d deficiencies  . 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 Atherosclerosis of native arteries of the extremities with gangrene ([place]) 05/08/2018  . Atrial fibrillation ([place])   . Blind    right eye  . Complication of anesthesia 2019   disoriented, trying to jump out of bed after bka  . Coronary artery calcification seen on CT scan    a. 08/2018 CT Chest: Mild cor Ca2+.  . Diabetes mellitus with complication ([place])   . Dilated aortic root ([place])    a. 04/2018 Echo: 4.1cm. Asc Ao 3.5cm; b. 04/2018 CT: Asc Ao 3.6cm. 4.1cm @ sinus of Valsalva.  . Dysrhythmia    a fib  . Gangrene ([place]) 09/06/2019  . Gangrene of right foot ([place]) 06/02/2018  . GERD (gastroesophageal reflux disease)   . History of echocardiogram    a. 04/2018 Echo: Ef 60-65%, no rwma, midly to mod dil Ao root - 4.1cm. Asc Ao 3.5cm. Mild MR. Nl RV fxn. Nl PASP.  Marland Kitchen History of hernia repair   . History of stress test    a. 2016 MV ([name]): EF 58%, no  ischemia.  Marland Kitchen Hx of BKA, right ([place]) 06/16/2018  . Hyperlipidemia   . Hypertension   . Leg pain   . Legally blind    right eye homonymous hemianopsa  . Memory change    d/t strokes  . Necrotic toes ([place]) 02/21/2018  . PAF (paroxysmal atrial fibrillation) ([name])    a. on Eliquis as of 2018; b. CHADS2VASc => 5 (HTN, DM, stroke x 2, vascular disease)  . Peripheral vascular disease ([place])    a. followed by Dr. Lucky Cowboy; b. s/p kissing balloon stents and right external iliac stent in 10/2017; c. 05/2018 s/p R BKA; c. 08/2019: Bilat Iliac PTA & DBA or REIA.  . Pulmonary nodules   . S/P bilateral BKA (below knee amputation) ([place]) 06/11/2018  . Stroke [facility])    a. 2016 & 2018  . Stroke [facility]) 07/11/2018    Patient Active Problem List   Diagnosis Date Noted  . Calculus of gallbladder without cholecystitis without obstruction 12/19/2019  . Right inguinal hernia 12/19/2019  . Diverticulosis 12/19/2019  . Hx of AKA (above knee amputation), right ([place]) 12/19/2019  . Chronic a-fib ([place])   . Scrotal swelling   . Abscess of right lower extremity 11/27/2019  . Thrombocytopenia ([place]) 11/27/2019  . Alcoholic cirrhosis of liver with ascites ([place]) 11/27/2019  . Hepatitis  C infection 11/27/2019  . Liver lesion 11/27/2019  . Acute metabolic encephalopathy 0000000  . Pressure ulcer of BKA stump, stage 3 ([place]) 10/26/2019  . Pressure ulcer of right calf, unstageable ([place]) 07/17/2019  . Fungal dermatitis 04/13/2019  . Leg edema, right 03/23/2019  . Right hemiparesis ([place]) 09/26/2018  . Lung nodule 08/15/2018  . Phantom pain ([place]) 08/14/2018  . Neurogenic pain 08/10/2018  . Stump pain ([place]) (Right) 07/20/2018  . Amputation stump infection ([place]) 07/20/2018  . Chronic anticoagulation (ELIQUIS) 07/20/2018  . Vitamin D insufficiency 07/18/2018  . Elevated sed rate 07/18/2018  . Cerebral vascular disease 07/11/2018  . Chronic pain of right lower extremity 07/11/2018  . Chronic pain syndrome 07/11/2018  .  Pharmacologic therapy 07/11/2018  . Iron deficiency anemia 05/11/2018  . Iron deficiency anemia due to chronic blood loss 05/08/2018  . Permanent atrial fibrillation ([place]) 08/20/2017  . PVD (peripheral vascular disease) ([place]) 07/15/2017  . 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 Memory loss 09/27/2015  . Alcoholic peripheral neuropathy ([place]) 05/16/2015  . Diabetic peripheral neuropathy ([place]) 05/16/2015  . History of stroke with residual deficit 04/01/2015  . Chronic headaches 03/15/2015  . Right homonymous hemianopsia 02/17/2015  . History of tobacco abuse 01/24/2015  . Mixed hyperlipidemia 01/21/2015  . 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 ABDOMINAL AORTA STENT     x 2  . AMPUTATION Right 06/02/2018   Procedure: AMPUTATION BELOW KNEE;  Surgeon: [name], MD;  Location: [facility] ORS;  Service: Vascular;  Laterality: Right;  . AMPUTATION Right 10/26/2019   Procedure: RIGHT AMPUTATION ABOVE KNEE;  Surgeon: [name], MD;  Location: [facility] ORS;  Service: Vascular;  Laterality: Right;  . APPENDECTOMY    . COLONOSCOPY WITH PROPOFOL N/A 05/12/2018   Procedure: COLONOSCOPY WITH PROPOFOL;  Surgeon: [name], MD;  Location[facility] ENDOSCOPY;  Service: Gastroenterology;  Laterality: N/A;  . ESOPHAGOGASTRODUODENOSCOPY (EGD) WITH PROPOFOL N/A 05/12/2018   Procedure: ESOPHAGOGASTRODUODENOSCOPY (EGD) WITH PROPOFOL;  Surgeon: [name], MD;  Location: [facility] ENDOSCOPY;  Service: Gastroenterology;  Laterality: N/A;  . GIVENS CAPSULE STUDY N/A 07/13/2018   Procedure: GIVENS CAPSULE STUDY;  Surgeon: [name], MD;  Location: [facility] ENDOSCOPY;  Service: Gastroenterology;  Laterality: N/A;  . HERNIA REPAIR     UMBILICAL  . INCISION AND DRAINAGE Right 11/11/2019   Procedure: INCISION AND DRAINAGE RIGHT ABOVE THE KNEE STUMP;  Surgeon: Evaristo Bury, MD;  Location: [facility] ORS;  Service: Vascular;   Laterality: Right;  . LEG AMPUTATION Right 10/26/2019   prior right BKA required AKA for non-healing ulcer and wound infection  . LOWER EXTREMITY ANGIOGRAPHY Right 10/25/2017   Procedure: LOWER EXTREMITY ANGIOGRAPHY;  Surgeon: [name], MD;  Location: [place] CV LAB;  Service: Cardiovascular;  Laterality: Right;  . LOWER EXTREMITY ANGIOGRAPHY Right 01/13/2018   Procedure: LOWER EXTREMITY ANGIOGRAPHY;  Surgeon: [name], MD;  Location: [place] CV LAB;  Service: Cardiovascular;  Laterality: Right;  . LOWER EXTREMITY ANGIOGRAPHY Right 08/28/2019   Procedure: LOWER EXTREMITY ANGIOGRAPHY;  Surgeon: [name], MD;  Location: [place] CV LAB;  Service: Cardiovascular;  Laterality: Right;  . LOWER EXTREMITY INTERVENTION  10/25/2017   Procedure: LOWER EXTREMITY INTERVENTION;  Surgeon: [name], MD;  Location: [place] CV LAB;  Service: Cardiovascular;;  . SKIN SPLIT GRAFT Right 12/28/2018   Procedure: SKIN GRAFT SPLIT THICKNESS ( SYNTHETIC );  Surgeon:  [name], MD;  Location: [facility] ORS;  Service: Vascular;  Laterality: Right;  . TONSILLECTOMY    . WOUND DEBRIDEMENT Right 08/08/2018   Procedure: DEBRIDEMENT WOUND WITH WOUND VAC APPLICATION;  Surgeon: [name], MD;  Location: [facility] ORS;  Service: Vascular;  Laterality: Right;    Prior to Admission medications   Medication Sig Start Date End Date Taking? Authorizing Provider  acetaminophen (TYLENOL) 325 MG tablet Take 2 tablets (650 mg total) by mouth every 6 (six) hours as needed for mild pain (or Fever >/= 101). 11/29/19  Yes Wieting, Richard, MD  acidophilus (RISAQUAD) CAPS capsule Take 1 capsule by mouth daily. 12/19/19  Yes Johnson, Megan P, DO  apixaban (ELIQUIS) 5 MG TABS tablet Take 1 tablet (5 mg total) by mouth 2 (two) times daily. 10/19/19  Yes Johnson, Megan P, DO  cholecalciferol (VITAMIN D3) 25 MCG (1000 UT) tablet Take 5,000 Units by mouth daily.    Yes [provider]  docusate sodium (COLACE)  100 MG capsule Take 100 mg by mouth daily. 12/12/19  Yes [provider]  ferrous sulfate (FERROUSUL) 325 (65 FE) MG tablet Take 1 tablet (325 mg total) by mouth 2 (two) times daily with a meal. 03/14/19  Yes Earlie Server, MD  furosemide (LASIX) 20 MG tablet Take 1 tablet (20 mg total) by mouth daily. 12/19/19  Yes Johnson, Megan P, DO  gabapentin (NEURONTIN) 600 MG tablet Take 2 tablets (1,200 mg total) by mouth 3 (three) times daily. 12/19/19  Yes Johnson, Megan P, DO  hydrochlorothiazide (MICROZIDE) 12.5 MG capsule Take 12.5 mg by mouth daily. 12/31/19  Yes [provider]  metFORMIN (GLUCOPHAGE) 500 MG tablet Take 1 tablet (500 mg total) by mouth 2 (two) times daily with a meal. 12/19/19  Yes Johnson, Megan P, DO  metoprolol tartrate (LOPRESSOR) 50 MG tablet Take 1 tablet (50 mg total) by mouth 2 (two) times daily. 12/19/19  Yes Johnson, Megan P, DO  Multiple Vitamin (MULTIVITAMIN WITH MINERALS) TABS tablet Take 1 tablet by mouth daily. 06/24/17  Yes Fritzi Mandes, MD  oxyCODONE (OXY IR/ROXICODONE) 5 MG immediate release tablet Take 1-2 tablets (5-10 mg total) by mouth every 6 (six) hours as needed for up to 8 days for moderate pain, severe pain or breakthrough pain. 12/29/19 01/06/20 Yes Kris Hartmann, NP  pantoprazole (PROTONIX) 40 MG tablet Take 1 tablet (40 mg total) by mouth daily. 12/19/19  Yes Johnson, Megan P, DO  spironolactone (ALDACTONE) 25 MG tablet Take 1 tablet (25 mg total) by mouth daily. 12/19/19  Yes Johnson, Megan P, DO  traZODone (DESYREL) 50 MG tablet Take 0.5 tablets (25 mg total) by mouth at bedtime as needed for sleep. 12/19/19  Yes Johnson, Megan P, DO  vitamin C (ASCORBIC ACID) 500 MG tablet Take 500 mg by mouth daily.   Yes [provider]  Ensure Max Protein (ENSURE MAX PROTEIN) LIQD Take 330 mLs (11 oz total) by mouth 2 (two) times daily between meals. 11/29/19   Loletha Grayer, MD    Allergies Sodium pentobarbital [pentobarbital] and Lipitor  [atorvastatin]  Family History  Problem Relation Age of Onset  . Diabetes Father   . Hypertension Father   . Hyperlipidemia Father     Social History Social History   Tobacco Use  . Smoking status: Former Smoker    Packs/day: 1.00    Years: 30.00    Pack years: 30.00    Types: Cigarettes    Quit date: 01/09/2018  Years since quitting: 1.9  . Smokeless tobacco: Never Used  Substance Use Topics  . Alcohol use: Yes    Alcohol/week: 0.0 standard drinks    Comment: occasionally beer   . Drug use: No    Review of Systems  Constitutional: Positive for fever. Eyes: No visual changes. ENT: No sore throat. Cardiovascular: Denies chest pain. Respiratory: Denies shortness of breath. Gastrointestinal: No abdominal pain.  No nausea, no vomiting.  No diarrhea.  No constipation. Genitourinary: Negative for dysuria. Musculoskeletal: Negative for back pain. Skin: Negative for rash. Neurological: Negative for headaches, focal weakness or numbness.   ____________________________________________   PHYSICAL EXAM:  VITAL SIGNS: ED Triage Vitals  Enc Vitals Group     BP 12/31/2019 0326 (!) 168/133     Pulse Rate 12/14/2019 0326 (!) 111     Resp 12/31/2019 0326 (!) 22     Temp 12/08/2019 0326 98.9 F (37.2 C)     Temp Source 12/07/2019 0326 Oral     SpO2 12/08/2019 0326 94 %     Weight 12/18/2019 0328 149 lb 14.6 oz (68 kg)     Height 12/27/2019 0328 5\' 9"  (1.753 m)     Head Circumference --      Peak Flow --      Pain Score --      Pain Loc --      Pain Edu? --      Excl. in [place]? --     Constitutional: Alert and oriented.  Disheveled appearing and in mild acute distress. Eyes: Conjunctivae are normal. PERRL. EOMI. Head: Atraumatic. Nose: No congestion/rhinnorhea. Mouth/Throat: Mucous membranes are mildly dry.  Edentulous. Neck: No stridor.   Cardiovascular: Tachycardic rate, regular rhythm. Grossly normal heart sounds.  Good peripheral circulation. Respiratory: Normal respiratory  effort.  No retractions. Lungs CTAB. Gastrointestinal: Soft and nontender to light or deep palpation. No distention. No abdominal bruits. No CVA tenderness. Musculoskeletal: Right AKA with working wound VAC.  No left lower extremity tenderness nor edema.  No joint effusions. Neurologic:  Normal speech and language. No gross focal neurologic deficits are appreciated.  Skin:  Skin is warm, dry.  Skin breakdown to sacrum.  No rash noted.  No petechiae. Psychiatric: Mood and affect are normal. Speech and behavior are normal.  ____________________________________________   LABS (all labs ordered are listed, but only abnormal results are displayed)  Labs Reviewed  CBC WITH DIFFERENTIAL/PLATELET - Abnormal; Notable for the following components:      Result Value   WBC 12.8 (*)    RBC 3.67 (*)    Hemoglobin 9.3 (*)    HCT 29.1 (*)    MCV 79.3 (*)    MCH 25.3 (*)    RDW 17.6 (*)    All other components within normal limits  COMPREHENSIVE METABOLIC PANEL - Abnormal; Notable for the following components:   Sodium 132 (*)    Glucose, Bld 158 (*)    Albumin 3.4 (*)    All other components within normal limits  URINALYSIS, COMPLETE (UACMP) WITH MICROSCOPIC - Abnormal; Notable for the following components:   Color, Urine AMBER (*)    APPearance CLOUDY (*)    Specific Gravity, Urine 1.033 (*)    Glucose, UA >=500 (*)    Protein, ur 30 (*)    All other components within normal limits  TROPONIN I (HIGH SENSITIVITY) - Abnormal; Notable for the following components:   Troponin I (High Sensitivity) 51 (*)    All other components within normal limits  SARS CORONAVIRUS 2 BY RT PCR (HOSPITAL ORDER, [facility])  CULTURE, BLOOD (ROUTINE X 2)  CULTURE, BLOOD (ROUTINE X 2)  URINE CULTURE  LACTIC ACID, PLASMA  AMMONIA  LACTIC ACID, PLASMA  TROPONIN I (HIGH SENSITIVITY)   ____________________________________________  EKG  ED ECG REPORT I, Rondel Episcopo J, the attending  physician, personally viewed and interpreted this ECG.   Date: 12/22/2019  EKG Time: 0354  Rate: 124  Rhythm: atrial fibrillation, rate 124  Axis: Borderline LAD  Intervals:none  ST&T Change: Nonspecific  ____________________________________________  RADIOLOGY  ED MD interpretation: Mild pulmonary vascular congestion  Official radiology report(s): DG Chest Port 1 View  Result Date: 12/27/2019 CLINICAL DATA:  Fever EXAM: PORTABLE CHEST 1 VIEW COMPARISON:  November 11, 2019 FINDINGS: There is mild cardiomegaly. There is slight prominence of the central pulmonary vasculature. No large airspace consolidation or pleural effusion. No acute osseous abnormality. IMPRESSION: Mild cardiomegaly and pulmonary vascular congestion. Electronically Signed   By: Prudencio Pair M.D.   On: 12/25/2019 04:14    ____________________________________________   PROCEDURES  Procedure(s) performed (including Critical Care):  .1-3 Lead EKG Interpretation Performed by: Paulette Blanch, MD Authorized by: Paulette Blanch, MD     Interpretation: abnormal     ECG rate:  110   ECG rate assessment: tachycardic     Rhythm: sinus tachycardia     Ectopy: none     Conduction: normal   Comments:     Patient placed on cardiac monitor to evaluate for arrhythmias   CRITICAL CARE Performed by: Paulette Blanch   Total critical care time: 45 minutes  Critical care time was exclusive of separately billable procedures and treating other patients.  Critical care was necessary to treat or prevent imminent or life-threatening deterioration.  Critical care was time spent personally by me on the following activities: development of treatment plan with patient and/or surrogate as well as nursing, discussions with consultants, evaluation of patient's response to treatment, examination of patient, obtaining history from patient or surrogate, ordering and performing treatments and interventions, ordering and review of laboratory  studies, ordering and review of radiographic studies, pulse oximetry and re-evaluation of patient's condition.   ____________________________________________   INITIAL IMPRESSION / ASSESSMENT AND PLAN / ED COURSE  As part of my medical decision making, I reviewed the following data within the [place] History obtained from family, Nursing notes reviewed and incorporated, Labs reviewed, EKG interpreted, Old chart reviewed, Radiograph reviewed and Notes from prior ED visits     Glenn B Kreiger Jr. was evaluated in Emergency Department on 12/10/2019 for the symptoms described in the history of present illness. He was evaluated in the context of the global COVID-19 pandemic, which necessitated consideration that the patient might be at risk for infection with the SARS-CoV-2 virus that causes COVID-19. Institutional protocols and algorithms that pertain to the evaluation of patients at risk for COVID-19 are in a state of rapid change based on information released by regulatory bodies including the CDC and federal and state organizations. These policies and algorithms were followed during the patient's care in the ED.    64 year old male presenting with fever.  Differential diagnosis includes but is not limited to viral process, pneumonia, UTI, wound infection, etc.  Will obtain sepsis work-up, ammonia given patient's history of cirrhosis, chest x-ray, UA.  Although patient denies pain, he is moaning and groaning as if he is in pain.  Will administer IV morphine.   Clinical Course  as of Dec 31 513  Mon Jan 01, 2020  [id number] Elevated troponin noted.  Will obtain in and out catheter urine and cover with empiric IV antibiotics.  Will discuss with hospitalist services for admission.   [JS]  0501 Rectal temperature 101.8 F. Patient states he's "not gonna do anything else tonight" - declines to take PO medicine. Will administer IV Toradol for fever.   [JS]    Clinical Course User  Index [JS] Paulette Blanch, MD     ____________________________________________   FINAL CLINICAL IMPRESSION(S) / ED DIAGNOSES  Final diagnoses:  Fever, unspecified fever cause  Pulmonary vascular congestion  Sepsis, due to unspecified organism, unspecified whether acute organ dysfunction present [facility])     ED Discharge Orders    None       Note:  This document was prepared using Dragon voice recognition software and may include unintentional dictation errors.   Paulette Blanch, MD 12/19/2019 [phone]

## 2020-01-01 NOTE — ED Notes (Addendum)
Vascular provider was at bedside removing wound vac - provider Brabham gave verbal order for fentanyl 50 prior to removing and then after removal he requested that the other 50 be given  Vascular provider redressed wound to right AKA stump

## 2020-01-01 NOTE — Progress Notes (Signed)
PHARMACY -  BRIEF ANTIBIOTIC NOTE   Pharmacy has received consult(s) for Vancomycin and Cefepime from an ED provider.  The patient's profile has been reviewed for ht/wt/allergies/indication/available labs.    One time order(s) placed for Cefepime 2gm and Vancomycin 1gm  Further antibiotics/pharmacy consults should be ordered by admitting physician if indicated.                       Thank you, Hart Robinsons A 12/04/2019  5:18 AM

## 2020-01-01 NOTE — Progress Notes (Addendum)
PHARMACY - PHYSICIAN COMMUNICATION CRITICAL VALUE ALERT - BLOOD CULTURE IDENTIFICATION (BCID)  [name]. is an 64 y.o. male who presented to [facility] on 12/03/2019 with a chief complaint of fever/sepsis  Assessment:   64 year old male with right AKA in March 21, and cellulitis.  Patient has 2/4 bottles with Staph spp. And methicillin resistance.  WBC appear elevated, and patient presents with fever.  Name of physician (or Provider) Contacted: Dr. Judd Gaudier  Current antibiotics: Cefepime 2 g IV q 8 hours, flagyl 500 mg IV q 8 hours  Changes to prescribed antibiotics recommended:  Vancomycin   Results for orders placed or performed during the hospital encounter of 12/03/2019  Blood Culture ID Panel (Reflexed) (Collected: 12/12/2019  3:58 AM)  Result Value Ref Range   Enterococcus species NOT DETECTED NOT DETECTED   Listeria monocytogenes NOT DETECTED NOT DETECTED   Staphylococcus species DETECTED (A) NOT DETECTED   Staphylococcus aureus (BCID) DETECTED (A) NOT DETECTED   Methicillin resistance DETECTED (A) NOT DETECTED   Streptococcus species NOT DETECTED NOT DETECTED   Streptococcus agalactiae NOT DETECTED NOT DETECTED   Streptococcus pneumoniae NOT DETECTED NOT DETECTED   Streptococcus pyogenes NOT DETECTED NOT DETECTED   Acinetobacter baumannii NOT DETECTED NOT DETECTED   Enterobacteriaceae species NOT DETECTED NOT DETECTED   Enterobacter cloacae complex NOT DETECTED NOT DETECTED   Escherichia coli NOT DETECTED NOT DETECTED   Klebsiella oxytoca NOT DETECTED NOT DETECTED   Klebsiella pneumoniae NOT DETECTED NOT DETECTED   Proteus species NOT DETECTED NOT DETECTED   Serratia marcescens NOT DETECTED NOT DETECTED   Haemophilus influenzae NOT DETECTED NOT DETECTED   Neisseria meningitidis NOT DETECTED NOT DETECTED   Pseudomonas aeruginosa NOT DETECTED NOT DETECTED   Candida albicans NOT DETECTED NOT DETECTED   Candida glabrata NOT DETECTED NOT DETECTED   Candida  krusei NOT DETECTED NOT DETECTED   Candida parapsilosis NOT DETECTED NOT DETECTED   Candida tropicalis NOT DETECTED NOT DETECTED    [name] 12/16/2019  7:41 PM

## 2020-01-01 NOTE — ED Triage Notes (Signed)
Patient to ED via POV as wife states fever. Patient is AKA and has wound vac in place. Patient can't answer questions well, just grunts and moans. Patient states he does "hurt" but can't specify where. Wife gave ibuprofen PTA but states fever at home 101.0.

## 2020-01-01 NOTE — Consult Note (Signed)
[facility] [place]  Patient name: [name]. MRN: [id number] DOB: 10/01/1955 Sex: male   REQUESTING PROVIDER:    ER   REASON FOR CONSULT:    sepsis  HISTORY OF PRESENT ILLNESS:   Glenn B Lauder [name] is a 64 y.o. male, who presented to the hospital with worsening pain in his right above-knee amputation stump as well as fevers.  He initially had a below-knee amputation which did not heal requiring conversion to a an above-knee amputation.  This required further I&D and resection of nonviable tissue on 11/11/2019.  He has been having wound VAC changes with home health at home.  The patient has a history of liver cirrhosis with ascites and history of hepatitis B.  He is on Eliquis for atrial fibrillation.  He has a type II diabetic  PAST MEDICAL HISTORY    Past Medical History:  Diagnosis Date  . Acute left PCA stroke ([place]) 02/17/2015  . Anemia    vitamin b and vit d deficiencies  . 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 Atherosclerosis of native arteries of the extremities with gangrene ([place]) 05/08/2018  . Atrial fibrillation ([place])   . Blind    right eye  . Complication of anesthesia 2019   disoriented, trying to jump out of bed after bka  . Coronary artery calcification seen on CT scan    a. 08/2018 CT Chest: Mild cor Ca2+.  . Diabetes mellitus with complication ([place])   . Dilated aortic root ([place])    a. 04/2018 Echo: 4.1cm. Asc Ao 3.5cm; b. 04/2018 CT: Asc Ao 3.6cm. 4.1cm @ sinus of Valsalva.  . Dysrhythmia    a fib  . Gangrene ([place]) 09/06/2019  . Gangrene of right foot ([place]) 06/02/2018  . GERD (gastroesophageal reflux disease)   . History of echocardiogram    a. 04/2018 Echo: Ef 60-65%, no rwma, midly to mod dil Ao root - 4.1cm. Asc Ao 3.5cm. Mild MR. Nl RV fxn. Nl PASP.  Marland Kitchen History of hernia repair   . History of stress test    a. 2016 MV ([name]): EF 58%, no ischemia.  Marland Kitchen Hx of  BKA, right ([place]) 06/16/2018  . Hyperlipidemia   . Hypertension   . Leg pain   . Legally blind    right eye homonymous hemianopsa  . Memory change    d/t strokes  . Necrotic toes ([place]) 02/21/2018  . PAF (paroxysmal atrial fibrillation) ([name])    a. on Eliquis as of 2018; b. CHADS2VASc => 5 (HTN, DM, stroke x 2, vascular disease)  . Peripheral vascular disease ([place])    a. followed by Dr. Lucky Cowboy; b. s/p kissing balloon stents and right external iliac stent in 10/2017; c. 05/2018 s/p R BKA; c. 08/2019: Bilat Iliac PTA & DBA or REIA.  . Pulmonary nodules   . S/P bilateral BKA (below knee amputation) ([place]) 06/11/2018  . Stroke [facility])    a. 2016 & 2018  . Stroke [facility]) 07/11/2018     FAMILY HISTORY   Family History  Problem Relation Age of Onset  . Diabetes Father   . Hypertension Father   . Hyperlipidemia Father     SOCIAL HISTORY:   Social History   Socioeconomic History  . Marital status: Married    Spouse name: debra  . Number of children: 0  . Years of education: Not on file  . Highest education level: Associate degree: academic program  Occupational History  Comment: disabled  Tobacco Use  . Smoking status: Former Smoker    Packs/day: 1.00    Years: 30.00    Pack years: 30.00    Types: Cigarettes    Quit date: 01/09/2018    Years since quitting: 1.9  . Smokeless tobacco: Never Used  Substance and Sexual Activity  . Alcohol use: Yes    Alcohol/week: 0.0 standard drinks    Comment: occasionally beer   . Drug use: No  . Sexual activity: Yes  Other Topics Concern  . Not on file  Social History Narrative   Lives with wife in a ranch style house.  Uses a walker or a wheelchair to get around.   Social Determinants of Health   Financial Resource Strain:   . Difficulty of Paying Living Expenses:   Food Insecurity:   . Worried About Charity fundraiser in the Last Year:   . Arboriculturist in the Last Year:   Transportation Needs:   . Film/video editor (Medical):    Marland Kitchen Lack of Transportation (Non-Medical):   Physical Activity:   . Days of Exercise per Week:   . Minutes of Exercise per Session:   Stress:   . Feeling of Stress :   Social Connections:   . Frequency of Communication with Friends and Family:   . Frequency of Social Gatherings with Friends and Family:   . Attends Religious Services:   . Active Member of Clubs or Organizations:   . Attends Archivist Meetings:   Marland Kitchen Marital Status:   Intimate Partner Violence:   . Fear of Current or Ex-Partner:   . Emotionally Abused:   Marland Kitchen Physically Abused:   . Sexually Abused:     ALLERGIES:    Allergies  Allergen Reactions  . Sodium Pentobarbital [Pentobarbital] Shortness Of Breath  . Lipitor [Atorvastatin] Rash    CURRENT MEDICATIONS:    Current Facility-Administered Medications  Medication Dose Route Frequency Provider Last Rate Last Admin  . 0.9 %  sodium chloride infusion   Intravenous Continuous Athena Masse, MD      . acetaminophen (TYLENOL) tablet 650 mg  650 mg Oral Q6H PRN Athena Masse, MD       Or  . acetaminophen (TYLENOL) suppository 650 mg  650 mg Rectal Q6H PRN Athena Masse, MD      . ceFEPIme (MAXIPIME) 2 g in sodium chloride 0.9 % 100 mL IVPB  2 g Intravenous Q8H Hall, Scott A, RPH      . heparin injection 5,000 Units  5,000 Units Subcutaneous Q8H Athena Masse, MD   5,000 Units at 12/08/2019 0802  . HYDROcodone-acetaminophen (NORCO/VICODIN) 5-325 MG per tablet 1-2 tablet  1-2 tablet Oral Q4H PRN Athena Masse, MD   2 tablet at 12/10/2019 1323  . insulin aspart (novoLOG) injection 0-5 Units  0-5 Units Subcutaneous QHS Judd Gaudier V, MD      . insulin aspart (novoLOG) injection 0-9 Units  0-9 Units Subcutaneous TID WC Athena Masse, MD   1 Units at 12/06/2019 1223  . ketorolac (TORADOL) 30 MG/ML injection 15 mg  15 mg Intravenous Q6H PRN Judd Gaudier V, MD      . metroNIDAZOLE (FLAGYL) IVPB 500 mg  500 mg Intravenous Q8H Judd Gaudier V, MD      .  ondansetron [facility]) tablet 4 mg  4 mg Oral Q6H PRN Athena Masse, MD       Or  . ondansetron [facility])  injection 4 mg  4 mg Intravenous Q6H PRN Athena Masse, MD       Current Outpatient Medications  Medication Sig Dispense Refill  . acetaminophen (TYLENOL) 325 MG tablet Take 2 tablets (650 mg total) by mouth every 6 (six) hours as needed for mild pain (or Fever >/= 101).    Marland Kitchen acidophilus (RISAQUAD) CAPS capsule Take 1 capsule by mouth daily. 90 capsule 1  . apixaban (ELIQUIS) 5 MG TABS tablet Take 1 tablet (5 mg total) by mouth 2 (two) times daily. 180 tablet 1  . cholecalciferol (VITAMIN D3) 25 MCG (1000 UT) tablet Take 5,000 Units by mouth daily.     Marland Kitchen docusate sodium (COLACE) 100 MG capsule Take 100 mg by mouth daily.    . ferrous sulfate (FERROUSUL) 325 (65 FE) MG tablet Take 1 tablet (325 mg total) by mouth 2 (two) times daily with a meal. 120 tablet 3  . furosemide (LASIX) 20 MG tablet Take 1 tablet (20 mg total) by mouth daily. 90 tablet 1  . gabapentin (NEURONTIN) 600 MG tablet Take 2 tablets (1,200 mg total) by mouth 3 (three) times daily. 540 tablet 1  . hydrochlorothiazide (MICROZIDE) 12.5 MG capsule Take 12.5 mg by mouth daily.    . metFORMIN (GLUCOPHAGE) 500 MG tablet Take 1 tablet (500 mg total) by mouth 2 (two) times daily with a meal. 180 tablet 1  . metoprolol tartrate (LOPRESSOR) 50 MG tablet Take 1 tablet (50 mg total) by mouth 2 (two) times daily. 180 tablet 1  . Multiple Vitamin (MULTIVITAMIN WITH MINERALS) TABS tablet Take 1 tablet by mouth daily. 30 tablet 0  . oxyCODONE (OXY IR/ROXICODONE) 5 MG immediate release tablet Take 1-2 tablets (5-10 mg total) by mouth every 6 (six) hours as needed for up to 8 days for moderate pain, severe pain or breakthrough pain. 60 tablet 0  . pantoprazole (PROTONIX) 40 MG tablet Take 1 tablet (40 mg total) by mouth daily. 90 tablet 1  . spironolactone (ALDACTONE) 25 MG tablet Take 1 tablet (25 mg total) by mouth daily. 90 tablet 1  .  traZODone (DESYREL) 50 MG tablet Take 0.5 tablets (25 mg total) by mouth at bedtime as needed for sleep. 45 tablet 1  . vitamin C (ASCORBIC ACID) 500 MG tablet Take 500 mg by mouth daily.    . Ensure Max Protein (ENSURE MAX PROTEIN) LIQD Take 330 mLs (11 oz total) by mouth 2 (two) times daily between meals. 330 mL 0    REVIEW OF SYSTEMS:   [X]  denotes positive finding, [ ]  denotes negative finding Cardiac  Comments:  Chest pain or chest pressure:    Shortness of breath upon exertion:    Short of breath when lying flat:    Irregular heart rhythm:        Vascular    Pain in calf, thigh, or hip brought on by ambulation:    Pain in feet at night that wakes you up from your sleep:     Blood clot in your veins:    Leg swelling:         Pulmonary    Oxygen at home:    Productive cough:     Wheezing:         Neurologic    Sudden weakness in arms or legs:     Sudden numbness in arms or legs:     Sudden onset of difficulty speaking or slurred speech:    Temporary loss of vision in one eye:  Problems with dizziness:         Gastrointestinal    Blood in stool:      Vomited blood:         Genitourinary    Burning when urinating:     Blood in urine:        Psychiatric    Major depression:         Hematologic    Bleeding problems:    Problems with blood clotting too easily:        Skin    Rashes or ulcers:        Constitutional    Fever or chills:     PHYSICAL EXAM:   Vitals:   12/05/2019 0630 12/28/2019 0634 12/28/2019 0730 12/24/2019 0750  BP: 113/77  105/74   Pulse:   (!) 117   Resp: 19  19   Temp:  100 F (37.8 C)    TempSrc:  Oral    SpO2:   97% 97%  Weight:      Height:        GENERAL: The patient is a well-nourished male, in no acute distress. The vital signs are documented above. CARDIAC: There is a regular rate and rhythm.  PULMONARY: Nonlabored respirations ABDOMEN: Soft and non-tender MUSCULOSKELETAL: Right above-knee wound VAC dressing was removed.  He  does have some cellulitis on the posterior aspect of his leg tracking proximally.  No obvious pockets identified within the wound.  There is healthy granulation tissue present. NEUROLOGIC: No focal weakness or paresthesias are detected. SKIN: There are no ulcers or rashes noted.   STUDIES:   none  ASSESSMENT and PLAN   Sepsis: After removing the wound VAC and examining the wound, it appears that he does have cellulitis.  I do not see any obvious deep tissue infection.  I placed him in a wet-to-dry dressing.  I recommend admission and IV antibiotics.  We will follow the patient while he is in the hospital.   Annamarie Major, IV, MD, FACS [facility] [phone] Pager [phone]

## 2020-01-01 NOTE — Progress Notes (Signed)
   12/28/2019 1641  Assess: MEWS Score  Temp 98.7 F (37.1 C)  BP (!) 122/105  Pulse Rate (!) 175  Resp 16  Level of Consciousness Alert  SpO2 98 %  O2 Device Room Air  Assess: MEWS Score  MEWS Temp 0  MEWS Systolic 0  MEWS Pulse 3  MEWS RR 0  MEWS LOC 0  MEWS Score 3  MEWS Score Color Yellow  Assess: if the MEWS score is Yellow or Red  Were vital signs taken at a resting state? Yes  Focused Assessment Documented focused assessment  Early Detection of Sepsis Score *See Row Information* High  MEWS guidelines implemented *See Row Information* Yes  Treat  MEWS Interventions Escalated (See documentation below)  Take Vital Signs  Increase Vital Sign Frequency  Yellow: Q 2hr X 2 then Q 4hr X 2, if remains yellow, continue Q 4hrs  Escalate  MEWS: Escalate Yellow: discuss with charge nurse/RN and consider discussing with provider and RRT  Notify: Charge Nurse/RN  Name of Charge Nurse/RN Notified [place]   Date Charge Nurse/RN Notified 12/13/2019  Time Charge Nurse/RN Notified 1649  Notify: Provider  Provider Name/Title Dr. Damita Dunnings Dr. Kurtis Bushman  Date Provider Notified 12/13/2019  Time Provider Notified [phone]  Notification Type  (chat)  Notification Reason Change in status  Response Other (Comment) Damita Dunnings was night MD - she referred me to on call hospitilist)  Date of Provider Response 12/14/2019  Time of Provider Response 1620  Notify: Rapid Response  Name of Rapid Response RN Notified Adalberto Ill, RN   Date Rapid Response Notified 12/02/2019  Time Rapid Response Notified [id number]  Document  Patient Outcome Stabilized after interventions  Progress note created (see row info) Yes  Pt assessed - in excessive pain.  MD notified. Yellow MEWS implemented.

## 2020-01-01 NOTE — Significant Event (Signed)
Rapid Response Event Note  Overview:  Paged: [phone], Arrival: 1646    Initial Focused Assessment: Rapid response RN arrived in patient's room with patient screaming in bed with his RN Elmyra Ricks) and Agricultural consultant at bedside. Patient was pale but skin dry and sheets under patient were very bloody with blood sheets as well in linen hamper. Patient with AKA that had been freshly wrapped with compression bandage by 1A staff already. Per report from 1A RNs, patient had had large amounts of bleeding from AKA site. They finally got the bleeding to stop, but patient's heart rate was in the 160s afib on the monitor. Vital signs right after rapid response RN arrival were HR 152 afib, BP 122/105, oxygen saturations 95% on room air, and respirations difficult to count due to screaming.  Interventions: Placed order for type and screen and CBC stat per rapid response standing orders. Dr. Kurtis Bushman paged at 17:02, and called back at 17:02 with update over the phone about patient's condition and she ordered pain and a. Patient given toradol per prn orders shortly after rapid response RNs arrival with little to no pain relief per patient. Dr. Trula Slade (vascular surgery) also paged with update about patient's AKA site. Dr. Trula Slade called back and affirmed current plan of care, stated to [place] (Charge RN on 1A) to continue compression bandage and reinforce as needed. After finished helping Elmyra Ricks RN change patient's sheets, new medications were cleared by pharmacy and Elmyra Ricks went to get those. Dr. Kurtis Bushman arrived in person to assess patient at 17:10. In addition to her assessment she ordered the patient's heparin stopped and a SCD for his non-AKA leg.  Plan of Care (if not transferred): Currently waiting for CBC results to see if transfusion needed and to see if pain and anxiety medications have any affect on patients heart rate and general condition. If no transfusion needed and patient calm and still has elevated heart rate, then  further interventions may be needed. Elmyra Ricks RN instructed to recall rapid response if needed. Vitals just before end of rapid were HR 148 afib, BP 95/73, oxygen saturations 95 % on room air and respiration count unable to be performed due to patient still intermittently yelling and talking.  Event Summary: Ended at 17:20   at      at          [place], [facility]

## 2020-01-01 NOTE — Progress Notes (Signed)
Spoke with Dr Russella Dar vascular re pts  Bleeding rt stump. We had  Placed 4x4s and abds and wrapped the stump with 2 ace wraps to  Secure and place pressure to stop the bleed. md  Michela Pitcher he would  Do the same and that pt  Had a raw surface bleed. And  Nothing else he could  Do.  Labs  To be drawn.

## 2020-01-01 NOTE — Progress Notes (Addendum)
Earlier today, I removed the patient's wound VAC from his right above-knee amputation.  He had beefy red granulation tissue that was very friable and oozing.  He is also on anticoagulation.  I placed a dressing over top of the wound.  Throughout the day the patient has had continued oozing from his wound.  I was contacted by the nursing staff and told them to reinforce this with a pressure dressing and to contact me if there were any other issues.  Later, a rapid response was called due to continued bleeding and tachycardia.  I came to evaluate the patient.  His dressing had been changed and a pressure dressing was now in place without ongoing bleeding.  I reinforced this with another Ace wrap to make sure the distal end of the stump where his granulation tissue had adequate pressure.  The patient did have a two-point drop in his hemoglobin.  He is also coagulopathic secondary to his liver failure with an INR of 2.8.  He is going to be transferred to stepdown.  He will receive blood and FFP to reverse his coagulopathy.  I do not think that surgical intervention is needed as this is just a oozing raw surface area in the setting of coagulopathy.  Annamarie Major

## 2020-01-01 NOTE — Progress Notes (Signed)
Alpha numeric text with message to please call [phone] about a rapid response sent.

## 2020-01-01 NOTE — ED Notes (Signed)
Pt with skin breakdown noted to sacrum and perineum. Pt with right below knee amputation wouth large wound over stump with wound vac in place. Pt with blister noted to left tibial area.

## 2020-01-01 NOTE — ED Notes (Signed)
ASSUMED ACER OF PATIENT AOX4, REPORTS PAIN TO RIGHT AKA WHERE PATIENT IS WEARING WOUND VAC. VSS. SCHEDULED MED GIVEN PTN FOR PAIN PROVIDED.

## 2020-01-01 NOTE — Consult Note (Signed)
Pharmacy Antibiotic Note  [name]. is a 64 y.o. male admitted on 12/17/2019 with cellulitis following AKA in March 2021.  He presents with pain, elevated WBC, and fevers, and 2/4 blood cultured bottles growing with Staph aureus (Methicillin resistance detected). Pharmacy has been consulted for vancomycin dosing.  Plan: Day 1 antibiotics  Patient initially loaded in ED with vancomycin 1000 mg IV x 1 at 0622 on 5/31, but then re-loaded with 1500 mg IV x 1 dose at 2200 on 5/31 as vancomycin was not continued this AM.   Given level reagent shortage, will dose per trough.  Goal trough 15-20.   Therefore, will start maintenance dose at 750mg  IV q8 hours per [facility].  Pharmacy will monitor renal function, and monitor vancomycin levels as clinically appropriate.  Height: 5\' 9"  (175.3 cm) Weight: 68 kg (149 lb 14.6 oz) IBW/kg (Calculated) : 70.7  Temp (24hrs), Avg:99 F (37.2 C), Min:98.2 F (36.8 C), Max:101.8 F (38.8 C)  Recent Labs  Lab 12/26/2019 0358 12/16/2019 0545 12/16/2019 1712  WBC 12.8*  --  13.8*  CREATININE 0.69  --   --   LATICACIDVEN 1.2 1.0  --     Estimated Creatinine Clearance: 88.5 mL/min (by C-G formula based on SCr of 0.69 mg/dL).    Allergies  Allergen Reactions  . Sodium Pentobarbital [Pentobarbital] Shortness Of Breath  . Lipitor [Atorvastatin] Rash    Antimicrobials this admission: 5/31 cefepime >> 5/31 5/31 flagyl >> 5/31 5/31 vancomycin >>  Dose adjustments this admission: None  Microbiology results: 5/31 BCx:  MRSA 5/31 UCx:  Negative 5/31 COVID (nasopharyngeal swab):  Negative  Thank you for allowing pharmacy to be a part of this patient's care.  [name], PharmD 12/02/2019 8:52 PM

## 2020-01-01 NOTE — Progress Notes (Signed)
Patient ID: [name], male   DOB: 1955/11/28, 64 y.o.   MRN: [id number] She is seen and examined.  This is a no charge note as patient was admitted this a.m.  History and physical reviewed.Louie Boston Ciulla [name] is a 64 y.o. male with medical history significant for liver cirrhosis with ascites and history of hepatitis B, chronic A. 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   Afebrile, wife at bedside Awake and alert Right lower extremity stump with erythema around the wound VAC, wound VAC with pieces of clot   Assessment and plan Sepsis [facility])   Surgical wound infection/cellulitis    History of right AKA  IV hydration Continue IV antibiotic Vascular input was appreciated recommend IV antibiotics and wet-to-dry dressing

## 2020-01-01 NOTE — ED Notes (Signed)
Pt requesting lunch tray - messaged admitting provider to see if pt needs to remain NPO since vascular has said no surgery at this time

## 2020-01-01 NOTE — H&P (Signed)
History and Physical    Mitchell B Grays Jr. [id number] DOB: August 03, 1956 DOA: 12/17/2019  PCP: [name], DO   Patient coming from: Home  I have personally briefly reviewed patient's old medical records in [place]  Chief Complaint: Fever  HPI: [name]. is a 64 y.o. male with medical history significant for liver cirrhosis with ascites and history of hepatitis B, chronic A. 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  Most of the history is given by the wife at the bedside who states that patient is having a significant amount of pain at the right AKA site.  She states that since his debridement he has been in constant pain and it has acutely worsened of late.  He has no cough, abdominal pain, shortness of breath or change in bowel habits.  ED Course: Temperature on arrival was 101.8, BP 119/78, pulse 124, respirations 20 with O2 sat 98% on room air blood work significant for WBC of 12,800 with lactic acid 1.2, hemoglobin 9.3.  Urinalysis without infection.  Troponin elevated at 51.  Review of Systems: As per HPI otherwise 10 point review of systems negative.    Past Medical History:  Diagnosis Date  . Acute left PCA stroke ([place]) 02/17/2015  . Anemia    vitamin b and vit d deficiencies  . 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 Atherosclerosis of native arteries of the extremities with gangrene ([place]) 05/08/2018  . Atrial fibrillation ([place])   . Blind    right eye  . Complication of anesthesia 2019   disoriented, trying to jump out of bed after bka  . Coronary artery calcification seen on CT scan    a. 08/2018 CT Chest: Mild cor Ca2+.  . Diabetes mellitus with complication ([place])   . Dilated aortic root ([place])    a. 04/2018 Echo: 4.1cm. Asc Ao 3.5cm; b.  04/2018 CT: Asc Ao 3.6cm. 4.1cm @ sinus of Valsalva.  . Dysrhythmia    a fib  . Gangrene ([place]) 09/06/2019  . Gangrene of right foot ([place]) 06/02/2018  . GERD (gastroesophageal reflux disease)   . History of echocardiogram    a. 04/2018 Echo: Ef 60-65%, no rwma, midly to mod dil Ao root - 4.1cm. Asc Ao 3.5cm. Mild MR. Nl RV fxn. Nl PASP.  Marland Kitchen History of hernia repair   . History of stress test    a. 2016 MV ([name]): EF 58%, no ischemia.  Marland Kitchen Hx of BKA, right ([place]) 06/16/2018  . Hyperlipidemia   . Hypertension   . Leg pain   . Legally blind    right eye homonymous hemianopsa  . Memory change    d/t strokes  . Necrotic toes ([place]) 02/21/2018  . PAF (paroxysmal atrial fibrillation) ([name])    a. on Eliquis as of 2018; b. CHADS2VASc => 5 (HTN, DM, stroke x 2, vascular disease)  . Peripheral vascular disease ([place])    a. followed by Dr. Lucky Cowboy; b. s/p kissing balloon stents and right external iliac stent in 10/2017; c. 05/2018 s/p R BKA; c. 08/2019: Bilat Iliac PTA & DBA or REIA.  . Pulmonary nodules   . S/P bilateral BKA (below knee amputation) ([place]) 06/11/2018  . Stroke [facility])    a. 2016 & 2018  . Stroke (  [place]) 07/11/2018    Past Surgical History:  Procedure Laterality Date  . ABDOMINAL AORTA STENT     x 2  . AMPUTATION Right 06/02/2018   Procedure: AMPUTATION BELOW KNEE;  Surgeon: [name], MD;  Location: [facility] ORS;  Service: Vascular;  Laterality: Right;  . AMPUTATION Right 10/26/2019   Procedure: RIGHT AMPUTATION ABOVE KNEE;  Surgeon: [name], MD;  Location: [facility] ORS;  Service: Vascular;  Laterality: Right;  . APPENDECTOMY    . COLONOSCOPY WITH PROPOFOL N/A 05/12/2018   Procedure: COLONOSCOPY WITH PROPOFOL;  Surgeon: [name], MD;  Location: [facility] ENDOSCOPY;  Service: Gastroenterology;  Laterality: N/A;  . ESOPHAGOGASTRODUODENOSCOPY (EGD) WITH PROPOFOL N/A 05/12/2018   Procedure: ESOPHAGOGASTRODUODENOSCOPY (EGD) WITH PROPOFOL;  Surgeon: [name], MD;  Location: [facility] ENDOSCOPY;  Service:  Gastroenterology;  Laterality: N/A;  . GIVENS CAPSULE STUDY N/A 07/13/2018   Procedure: GIVENS CAPSULE STUDY;  Surgeon: [name], MD;  Location: [facility] ENDOSCOPY;  Service: Gastroenterology;  Laterality: N/A;  . HERNIA REPAIR     UMBILICAL  . INCISION AND DRAINAGE Right 11/11/2019   Procedure: INCISION AND DRAINAGE RIGHT ABOVE THE KNEE STUMP;  Surgeon: Evaristo Bury, MD;  Location: [facility] ORS;  Service: Vascular;  Laterality: Right;  . LEG AMPUTATION Right 10/26/2019   prior right BKA required AKA for non-healing ulcer and wound infection  . LOWER EXTREMITY ANGIOGRAPHY Right 10/25/2017   Procedure: LOWER EXTREMITY ANGIOGRAPHY;  Surgeon: [name], MD;  Location: [place] CV LAB;  Service: Cardiovascular;  Laterality: Right;  . LOWER EXTREMITY ANGIOGRAPHY Right 01/13/2018   Procedure: LOWER EXTREMITY ANGIOGRAPHY;  Surgeon: [name], MD;  Location: [place] CV LAB;  Service: Cardiovascular;  Laterality: Right;  . LOWER EXTREMITY ANGIOGRAPHY Right 08/28/2019   Procedure: LOWER EXTREMITY ANGIOGRAPHY;  Surgeon: [name], MD;  Location: [place] CV LAB;  Service: Cardiovascular;  Laterality: Right;  . LOWER EXTREMITY INTERVENTION  10/25/2017   Procedure: LOWER EXTREMITY INTERVENTION;  Surgeon: [name], MD;  Location: [place] CV LAB;  Service: Cardiovascular;;  . SKIN SPLIT GRAFT Right 12/28/2018   Procedure: SKIN GRAFT SPLIT THICKNESS ( SYNTHETIC );  Surgeon: [name], MD;  Location: [facility] ORS;  Service: Vascular;  Laterality: Right;  . TONSILLECTOMY    . WOUND DEBRIDEMENT Right 08/08/2018   Procedure: DEBRIDEMENT WOUND WITH WOUND VAC APPLICATION;  Surgeon: [name], MD;  Location: [facility] ORS;  Service: Vascular;  Laterality: Right;     reports that he quit smoking about 1 years ago. His smoking use included cigarettes. He has a 30.00 pack-year smoking history. He has never used smokeless tobacco. He reports current alcohol use. He reports that he does not use  drugs.  Allergies  Allergen Reactions  . Sodium Pentobarbital [Pentobarbital] Shortness Of Breath  . Lipitor [Atorvastatin] Rash    Family History  Problem Relation Age of Onset  . Diabetes Father   . Hypertension Father   . Hyperlipidemia Father      Prior to Admission medications   Medication Sig Start Date End Date Taking? Authorizing Provider  acetaminophen (TYLENOL) 325 MG tablet Take 2 tablets (650 mg total) by mouth every 6 (six) hours as needed for mild pain (or Fever >/= 101). 11/29/19  Yes Wieting, Richard, MD  acidophilus (RISAQUAD) CAPS capsule Take 1 capsule by mouth daily. 12/19/19  Yes Johnson, Megan P, DO  apixaban (ELIQUIS) 5 MG TABS tablet Take 1 tablet (5 mg total) by mouth 2 (two) times daily.  10/19/19  Yes Johnson, Megan P, DO  cholecalciferol (VITAMIN D3) 25 MCG (1000 UT) tablet Take 5,000 Units by mouth daily.    Yes [provider]  docusate sodium (COLACE) 100 MG capsule Take 100 mg by mouth daily. 12/12/19  Yes [provider]  ferrous sulfate (FERROUSUL) 325 (65 FE) MG tablet Take 1 tablet (325 mg total) by mouth 2 (two) times daily with a meal. 03/14/19  Yes Earlie Server, MD  furosemide (LASIX) 20 MG tablet Take 1 tablet (20 mg total) by mouth daily. 12/19/19  Yes Johnson, Megan P, DO  gabapentin (NEURONTIN) 600 MG tablet Take 2 tablets (1,200 mg total) by mouth 3 (three) times daily. 12/19/19  Yes Johnson, Megan P, DO  hydrochlorothiazide (MICROZIDE) 12.5 MG capsule Take 12.5 mg by mouth daily. 12/31/19  Yes [provider]  metFORMIN (GLUCOPHAGE) 500 MG tablet Take 1 tablet (500 mg total) by mouth 2 (two) times daily with a meal. 12/19/19  Yes Johnson, Megan P, DO  metoprolol tartrate (LOPRESSOR) 50 MG tablet Take 1 tablet (50 mg total) by mouth 2 (two) times daily. 12/19/19  Yes Johnson, Megan P, DO  Multiple Vitamin (MULTIVITAMIN WITH MINERALS) TABS tablet Take 1 tablet by mouth daily. 06/24/17  Yes Fritzi Mandes, MD  oxyCODONE (OXY  IR/ROXICODONE) 5 MG immediate release tablet Take 1-2 tablets (5-10 mg total) by mouth every 6 (six) hours as needed for up to 8 days for moderate pain, severe pain or breakthrough pain. 12/29/19 01/06/20 Yes Kris Hartmann, NP  pantoprazole (PROTONIX) 40 MG tablet Take 1 tablet (40 mg total) by mouth daily. 12/19/19  Yes Johnson, Megan P, DO  spironolactone (ALDACTONE) 25 MG tablet Take 1 tablet (25 mg total) by mouth daily. 12/19/19  Yes Johnson, Megan P, DO  traZODone (DESYREL) 50 MG tablet Take 0.5 tablets (25 mg total) by mouth at bedtime as needed for sleep. 12/19/19  Yes Johnson, Megan P, DO  vitamin C (ASCORBIC ACID) 500 MG tablet Take 500 mg by mouth daily.   Yes [provider]  Ensure Max Protein (ENSURE MAX PROTEIN) LIQD Take 330 mLs (11 oz total) by mouth 2 (two) times daily between meals. 11/29/19   Loletha Grayer, MD    Physical Exam: Vitals:   12/31/2019 0328 12/07/2019 0430 12/26/2019 0500 12/31/2019 0502  BP:  119/78 109/79   Pulse:  (!) 124 95 (!) 116  Resp:  20 19 19   Temp:  (!) 101.8 F (38.8 C)    TempSrc:  Rectal    SpO2:  98% (!) 87% 96%  Weight: 68 kg     Height: 5\' 9"  (1.753 m)        Vitals:   12/30/2019 0328 12/15/2019 0430 12/10/2019 0500 12/09/2019 0502  BP:  119/78 109/79   Pulse:  (!) 124 95 (!) 116  Resp:  20 19 19   Temp:  (!) 101.8 F (38.8 C)    TempSrc:  Rectal    SpO2:  98% (!) 87% 96%  Weight: 68 kg     Height: 5\' 9"  (1.753 m)       Constitutional: Somewhat lethargic, oriented x3, not in any acute distress. Eyes: PERLA, EOMI, irises appear normal, anicteric sclera,  ENMT: external ears and nose appear normal, normal hearing  Neck: neck appears normal, no masses, normal ROM, no thyromegaly, no JVD  CVS: S1-S2 clear, no murmur rubs or gallops,  , no carotid bruits, pedal pulses palpable, 3+ pitting edema Respiratory: Diminished bilaterally, no wheezing, rales or  rhonchi. Respiratory effort normal. No accessory muscle use.  Abdomen: soft nontender,  mildly distended, normal bowel sounds, no hepatosplenomegaly, no hernias Musculoskeletal: : no cyanosis, clubbing , right AKA Neuro: Grossly intact.  Able to move all extremities Psych: judgement and insight appear normal, stable mood and affect,  Skin: Wound VAC on right AKA stump.  Some surrounding redness posteriorly.  Tender on palpation but no obvious areas of fluctuance  Labs on Admission: I have personally reviewed following labs and imaging studies  CBC: Recent Labs  Lab 12/16/2019 0358  WBC 12.8*  NEUTROABS PENDING  HGB 9.3*  HCT 29.1*  MCV 79.3*  PLT [id number]   Basic Metabolic Panel: Recent Labs  Lab 12/07/2019 0358  NA 132*  K 3.8  CL 99  CO2 23  GLUCOSE 158*  BUN 18  CREATININE 0.69  CALCIUM 8.9   GFR: Estimated Creatinine Clearance: 88.5 mL/min (by C-G formula based on SCr of 0.69 mg/dL). Liver Function Tests: Recent Labs  Lab 12/23/2019 0358  AST 18  ALT 14  ALKPHOS 105  BILITOT 1.1  PROT 7.3  ALBUMIN 3.4*   No results for input(s): LIPASE, AMYLASE in the last 168 hours. Recent Labs  Lab 12/04/2019 0358  AMMONIA 21   Coagulation Profile: No results for input(s): INR, PROTIME in the last 168 hours. Cardiac Enzymes: No results for input(s): CKTOTAL, CKMB, CKMBINDEX, TROPONINI in the last 168 hours. BNP (last 3 results) No results for input(s): PROBNP in the last 8760 hours. HbA1C: No results for input(s): HGBA1C in the last 72 hours. CBG: No results for input(s): GLUCAP in the last 168 hours. Lipid Profile: No results for input(s): CHOL, HDL, LDLCALC, TRIG, CHOLHDL, LDLDIRECT in the last 72 hours. Thyroid Function Tests: No results for input(s): TSH, T4TOTAL, FREET4, T3FREE, THYROIDAB in the last 72 hours. Anemia Panel: No results for input(s): VITAMINB12, FOLATE, FERRITIN, TIBC, IRON, RETICCTPCT in the last 72 hours. Urine analysis:    Component Value Date/Time   COLORURINE AMBER (A) 12/31/2019 0358   APPEARANCEUR CLOUDY (A) 12/19/2019 0358    APPEARANCEUR Clear 07/03/2019 1513   LABSPEC 1.033 (H) 12/23/2019 0358   PHURINE 5.0 12/30/2019 0358   GLUCOSEU >=500 (A) 12/11/2019 0358   HGBUR NEGATIVE 12/04/2019 0358   BILIRUBINUR NEGATIVE 12/08/2019 0358   BILIRUBINUR Negative 07/03/2019 1513   KETONESUR NEGATIVE 12/28/2019 0358   PROTEINUR 30 (A) 12/31/2019 0358   NITRITE NEGATIVE 12/02/2019 0358   LEUKOCYTESUR NEGATIVE 12/24/2019 0358    Radiological Exams on Admission: DG Chest Port 1 View  Result Date: 12/17/2019 CLINICAL DATA:  Fever EXAM: PORTABLE CHEST 1 VIEW COMPARISON:  November 11, 2019 FINDINGS: There is mild cardiomegaly. There is slight prominence of the central pulmonary vasculature. No large airspace consolidation or pleural effusion. No acute osseous abnormality. IMPRESSION: Mild cardiomegaly and pulmonary vascular congestion. Electronically Signed   By: Prudencio Pair M.D.   On: 12/09/2019 04:14      Assessment/Plan Principal Problem:   Sepsis ([place])   Surgical wound infection    History of right AKA -Patient with history of right AKA in March complicated by sepsis of AKA wound in April 2021, with wound VAC in place, presenting with pain at amputation site, fever, tachycardia and tachypnea and leukocytosis concerning for sepsis -IV hydration -IV antibiotics -Consult vascular -   Diabetes type 2 with atherosclerosis of arteries of extremities ([name]) -Sliding scale insulin coverage  Chronic atrial fibrillation -Hold home Eliquis for possibility of procedure -Heparin subcu for DVT prophylaxis pending vascular consult  Alcoholic cirrhosis of liver with ascites ([name]) -Appears compensated.  Ammonia 21      DVT prophylaxis: Heparin Code Status: full code  Family Communication:  none  Disposition Plan: Back to previous home environment Consults called: Dr. Trula Slade Status:At the time of admission, it appears that the appropriate admission status for this patient is INPATIENT. This is judged to be reasonable  and necessary in order to provide the required intensity of service to ensure the patient's safety given the presenting symptoms, physical exam findings, and initial radiographic and laboratory data in the context of their  Comorbid conditions.   Patient requires inpatient status due to high intensity of service, high risk for further deterioration and high frequency of surveillance required.   I certify that at the point of admission it is my clinical judgment that the patient will require inpatient hospital care spanning beyond [place] MD [facility]     12/22/2019, 5:21 AM

## 2020-01-01 NOTE — Progress Notes (Signed)
CODE SEPSIS - PHARMACY COMMUNICATION  **Broad Spectrum Antibiotics should be administered within 1 hour of Sepsis diagnosis**  Time Code Sepsis Called/Page Received: [id number]  Antibiotics Ordered: Vancomycin and Cefepime  Time of 1st antibiotic administration: 0542  Additional action taken by pharmacy: n/a  If necessary, Name of Provider/Nurse Contacted: n/a    Ena Dawley ,PharmD Clinical Pharmacist  12/20/2019  5:19 AM

## 2020-01-01 NOTE — Progress Notes (Addendum)
    BRIEF OVERNIGHT PROGRESS REPORT   SUBJECTIVE: Rapid response called by patient's primary nurse due to active bleeding from right knee amputation stump site.  Patient is also noted to be tachycardic in the 150-170s, pale looking  and in severe pain.Apparently rapid response was called last  evening for similar incident.  OBJECTIVE:On arrival to the bedside, he was afebrile with blood pressure 103/87 mm Hg and pulse rate 140 beats/min. There were no focal neurological deficits; he was alert but unable to answer orientation question due to severe pain.  Patient was very pale looking, wound dressing on right stump was soaked in blood that appeared to have been ongoing since he was sitting in a large pool of almost clotted blood. Dressing was removed and re-wrapped with karlex, ABD pads and ace bandage. However it continued to ooze through the dressing.  BRIEF PATIENT DESCRIPTION: 64 year old male with PMH left PCA stroke.  Atrial fibrillation on anticoagulation with Eliquis, CAD, GI tract, right BKA, HLD, HTN DM, liver cirrhosis with ascites, hep B, PVD status post stent presenting with nonhealing wound and worsening pain in his right BKA stump as well as fevers.  ASSESSMENT: Hemorrhagic shock Afib with RVR Surgical wound infection Liver cirrhosis  PLAN:  Hemorrhagic shock- secondary to acute massive bleeding from right BKA surgical stump with evidence hemodynamic instability Suspect prolonged bleeding in the setting of Liver cirrhosis and anticoagulation use - Transfer to stepdown unit - Bolus NS administered - STAT CBC and INR obtained which showed Hgb of 6.4 and INR 3.0 - 2 units of blood ordered to be administered STAT - Will attempt at least 2x IV access, 18 gauge or larger - IVF resuscitation to maintain MAP>65 - H&H monitoring q6h - Blood Consent.  Transfuse PRBCs with goal Hgb>8 - Hold anticoagulation  - Administer FFP,KCentra (40F-PCC)  and Vitamin K in attempt to reverse his  coagulopathy. - Discussed case with Vascular surgeon on call who graciously came to the bedside to assess and reinforce the bleeding. Due to continued active bleeding with hemodynamic instability, plan to proceed to OR for surgical intervention - Consult to intensivist for management  Liver Cirrhosis -  Hx: Hep B # Coagulopathy with Elevated INR Administer FFP,KCentra (40F-PCC)  and Vitamin K as above  (goal INR<2, Plt>50, fibrinogen>100) Bleeding precautions  AFib+RVR, Unstable (hypotensive) - Amiodarone 150mg  IV bolus, then 1mg /min x6 hrs, then 0.5mg /min for 18 hours Consider cardiology consult  Sepsis - likely from infected Right AKA - H/o recent AKA and infected stump with MRSA/enterococcus  - Continue Vancomycin - ID and Vascular following     Rufina Falco, DNP, CCRN, FNP-C [facility] Nurse Practitioner Between 7pm to 7am - Pager [phone]  After 7am go to [email or web address] - [email or web address] [facility]  [facility] [employer]  [phone]

## 2020-01-01 NOTE — ED Notes (Signed)
Report to jeanette, rn.  

## 2020-01-01 NOTE — Progress Notes (Signed)
   12/02/2019 1529  Assess: MEWS Score  Temp 98.2 F (36.8 C)  BP 92/64  Pulse Rate (!) 124  Resp 17  Level of Consciousness Alert  SpO2 98 %  O2 Device Room Air  Assess: MEWS Score  MEWS Temp 0  MEWS Systolic 1  MEWS Pulse 2  MEWS RR 0  MEWS LOC 0  MEWS Score 3  MEWS Score Color Yellow  Assess: if the MEWS score is Yellow or Red  Were vital signs taken at a resting state? Yes  Focused Assessment Documented focused assessment  Early Detection of Sepsis Score *See Row Information* High  MEWS guidelines implemented *See Row Information* No, previously yellow, continue vital signs every 4 hours  Pt previously yellow MEWS in ED will continue MEWS protocol.

## 2020-01-01 NOTE — Progress Notes (Signed)
Dr. Kurtis Bushman called unit and spoke with RN

## 2020-01-01 NOTE — Progress Notes (Signed)
Dr. Kurtis Bushman to pt room.

## 2020-01-02 ENCOUNTER — Encounter: Admission: EM | Disposition: E | Payer: Self-pay | Source: Home / Self Care | Attending: Internal Medicine

## 2020-01-02 ENCOUNTER — Inpatient Hospital Stay: Payer: Medicare HMO | Admitting: Anesthesiology

## 2020-01-02 ENCOUNTER — Inpatient Hospital Stay: Payer: Medicare HMO

## 2020-01-02 DIAGNOSIS — Z89611 Acquired absence of right leg above knee: Secondary | ICD-10-CM

## 2020-01-02 DIAGNOSIS — B9562 Methicillin resistant Staphylococcus aureus infection as the cause of diseases classified elsewhere: Secondary | ICD-10-CM

## 2020-01-02 DIAGNOSIS — R578 Other shock: Secondary | ICD-10-CM

## 2020-01-02 DIAGNOSIS — J9600 Acute respiratory failure, unspecified whether with hypoxia or hypercapnia: Secondary | ICD-10-CM

## 2020-01-02 DIAGNOSIS — R7881 Bacteremia: Secondary | ICD-10-CM

## 2020-01-02 DIAGNOSIS — R0989 Other specified symptoms and signs involving the circulatory and respiratory systems: Secondary | ICD-10-CM

## 2020-01-02 HISTORY — PX: AMPUTATION: SHX166

## 2020-01-02 LAB — TRIGLYCERIDES: Triglycerides: 107 mg/dL (ref <150)

## 2020-01-02 LAB — CBC WITH DIFFERENTIAL/PLATELET
Abs Immature Granulocytes: 2 10*3/uL — ABNORMAL HIGH (ref 0.00–0.07)
Basophils Absolute: 0.1 10*3/uL (ref 0.0–0.1)
Basophils Relative: 1 %
Eosinophils Absolute: 0 10*3/uL (ref 0.0–0.5)
Eosinophils Relative: 0 %
HCT: 21.7 % — ABNORMAL LOW (ref 39.0–52.0)
Hemoglobin: 6.9 g/dL — ABNORMAL LOW (ref 13.0–17.0)
Immature Granulocytes: 9 %
Lymphocytes Relative: 6 %
Lymphs Abs: 1.4 10*3/uL (ref 0.7–4.0)
MCH: 27.8 pg (ref 26.0–34.0)
MCHC: 31.8 g/dL (ref 30.0–36.0)
MCV: 87.5 fL (ref 80.0–100.0)
Monocytes Absolute: 2 10*3/uL — ABNORMAL HIGH (ref 0.1–1.0)
Monocytes Relative: 8 %
Neutro Abs: 17.9 10*3/uL — ABNORMAL HIGH (ref 1.7–7.7)
Neutrophils Relative %: 76 %
Platelets: 125 10*3/uL — ABNORMAL LOW (ref 150–400)
RBC: 2.48 MIL/uL — ABNORMAL LOW (ref 4.22–5.81)
RDW: 15.9 % — ABNORMAL HIGH (ref 11.5–15.5)
Smear Review: NORMAL
WBC: 23.3 10*3/uL — ABNORMAL HIGH (ref 4.0–10.5)
nRBC: 0.5 % — ABNORMAL HIGH (ref 0.0–0.2)

## 2020-01-02 LAB — URINE CULTURE: Culture: NO GROWTH

## 2020-01-02 LAB — PLATELET FUNCTION ASSAY: Collagen / Epinephrine: 166 seconds (ref 0–193)

## 2020-01-02 LAB — SURGICAL PCR SCREEN
MRSA, PCR: POSITIVE — AB
Staphylococcus aureus: POSITIVE — AB

## 2020-01-02 LAB — BLOOD GAS, ARTERIAL
Acid-base deficit: 10.2 mmol/L — ABNORMAL HIGH (ref 0.0–2.0)
Bicarbonate: 14.9 mmol/L — ABNORMAL LOW (ref 20.0–28.0)
FIO2: 1
MECHVT: 500 mL
Mechanical Rate: 20
O2 Saturation: 100 %
PEEP: 5 cmH2O
Patient temperature: 37
pCO2 arterial: 29 mmHg — ABNORMAL LOW (ref 32.0–48.0)
pH, Arterial: 7.32 — ABNORMAL LOW (ref 7.350–7.450)
pO2, Arterial: 509 mmHg — ABNORMAL HIGH (ref 83.0–108.0)

## 2020-01-02 LAB — COMPREHENSIVE METABOLIC PANEL
ALT: 132 U/L — ABNORMAL HIGH (ref 0–44)
AST: 278 U/L — ABNORMAL HIGH (ref 15–41)
Albumin: 2.1 g/dL — ABNORMAL LOW (ref 3.5–5.0)
Alkaline Phosphatase: 48 U/L (ref 38–126)
Anion gap: 10 (ref 5–15)
BUN: 18 mg/dL (ref 8–23)
CO2: 16 mmol/L — ABNORMAL LOW (ref 22–32)
Calcium: 6.5 mg/dL — ABNORMAL LOW (ref 8.9–10.3)
Chloride: 108 mmol/L (ref 98–111)
Creatinine, Ser: 0.88 mg/dL (ref 0.61–1.24)
GFR calc Af Amer: >60 mL/min (ref >60)
GFR calc non Af Amer: >60 mL/min (ref >60)
Glucose, Bld: 264 mg/dL — ABNORMAL HIGH (ref 70–99)
Potassium: 3.9 mmol/L (ref 3.5–5.1)
Sodium: 134 mmol/L — ABNORMAL LOW (ref 135–145)
Total Bilirubin: 1.5 mg/dL — ABNORMAL HIGH (ref 0.3–1.2)
Total Protein: 3.7 g/dL — ABNORMAL LOW (ref 6.5–8.1)

## 2020-01-02 LAB — MASSIVE TRANSFUSION PROTOCOL ORDER (BLOOD BANK NOTIFICATION)

## 2020-01-02 LAB — BPAM FFP
Blood Product Expiration Date: 202106062359
Blood Product Expiration Date: 202106052359
ISSUE DATE / TIME: 202105312259
Unit Type and Rh: 1700
Unit Type and Rh: 1700

## 2020-01-02 LAB — CORTISOL-AM, BLOOD: Cortisol - AM: 14.3 ug/dL (ref 6.7–22.6)

## 2020-01-02 LAB — HEMOGLOBIN AND HEMATOCRIT, BLOOD
HCT: 27.4 % — ABNORMAL LOW (ref 39.0–52.0)
HCT: 29.3 % — ABNORMAL LOW (ref 39.0–52.0)
HCT: 27.6 % — ABNORMAL LOW (ref 39.0–52.0)
Hemoglobin: 9.4 g/dL — ABNORMAL LOW (ref 13.0–17.0)
Hemoglobin: 10.4 g/dL — ABNORMAL LOW (ref 13.0–17.0)
Hemoglobin: 9.4 g/dL — ABNORMAL LOW (ref 13.0–17.0)

## 2020-01-02 LAB — GLUCOSE, CAPILLARY
Glucose-Capillary: 187 mg/dL — ABNORMAL HIGH (ref 70–99)
Glucose-Capillary: 191 mg/dL — ABNORMAL HIGH (ref 70–99)
Glucose-Capillary: 196 mg/dL — ABNORMAL HIGH (ref 70–99)
Glucose-Capillary: 210 mg/dL — ABNORMAL HIGH (ref 70–99)
Glucose-Capillary: 210 mg/dL — ABNORMAL HIGH (ref 70–99)
Glucose-Capillary: 235 mg/dL — ABNORMAL HIGH (ref 70–99)

## 2020-01-02 LAB — PREPARE FRESH FROZEN PLASMA
Unit division: 0
Unit division: 0

## 2020-01-02 LAB — PROCALCITONIN: Procalcitonin: 1.38 ng/mL

## 2020-01-02 LAB — PROTIME-INR
INR: 2.4 — ABNORMAL HIGH (ref 0.8–1.2)
Prothrombin Time: 25.2 seconds — ABNORMAL HIGH (ref 11.4–15.2)

## 2020-01-02 LAB — PREPARE RBC (CROSSMATCH)

## 2020-01-02 IMAGING — DX DG ABDOMEN 1V
1 series · 1 of 1 positions shown · non-contrast
Comparison: None.

CLINICAL DATA: OG tube placement

EXAM:
ABDOMEN - 1 VIEW

[abdomen supine]
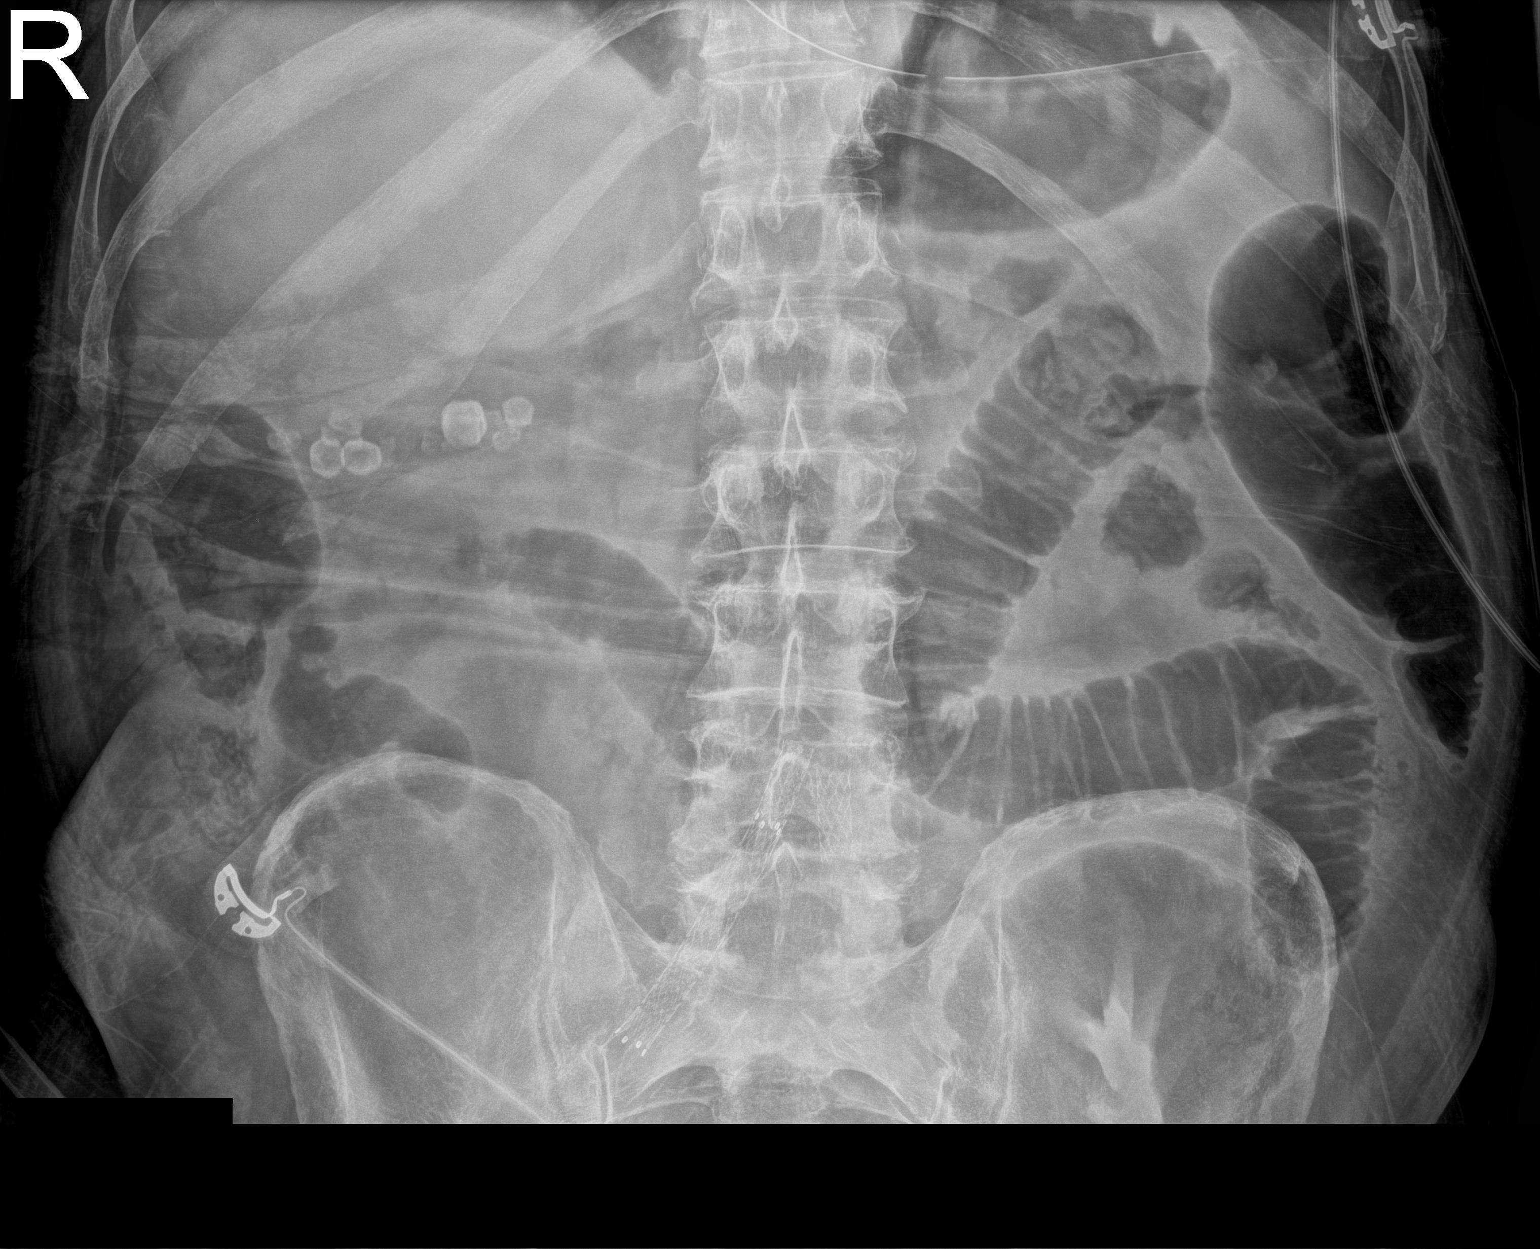

[1 of 1 positions shown; findings below may reference images not displayed]

FINDINGS: There is mildly prominent air-filled loops of small bowel within the
left mid abdomen. There is air seen within the descending colon and
rectum. OG tube tip is seen within the proximal stomach. Calcified
gallstones within the right upper quadrant. Vascular stents within
the deep pelvis.
IMPRESSION: Mildly dilated small bowel loops which may be due to partial small
bowel obstruction versus ileus.

OG tube within the proximal stomach.

## 2020-01-02 IMAGING — DX DG CHEST 1V PORT
1 series · 1 of 1 positions shown · non-contrast
Comparison: None.

CLINICAL DATA: Intubation and line placement

EXAM:
PORTABLE CHEST 1 VIEW

[chest ap]
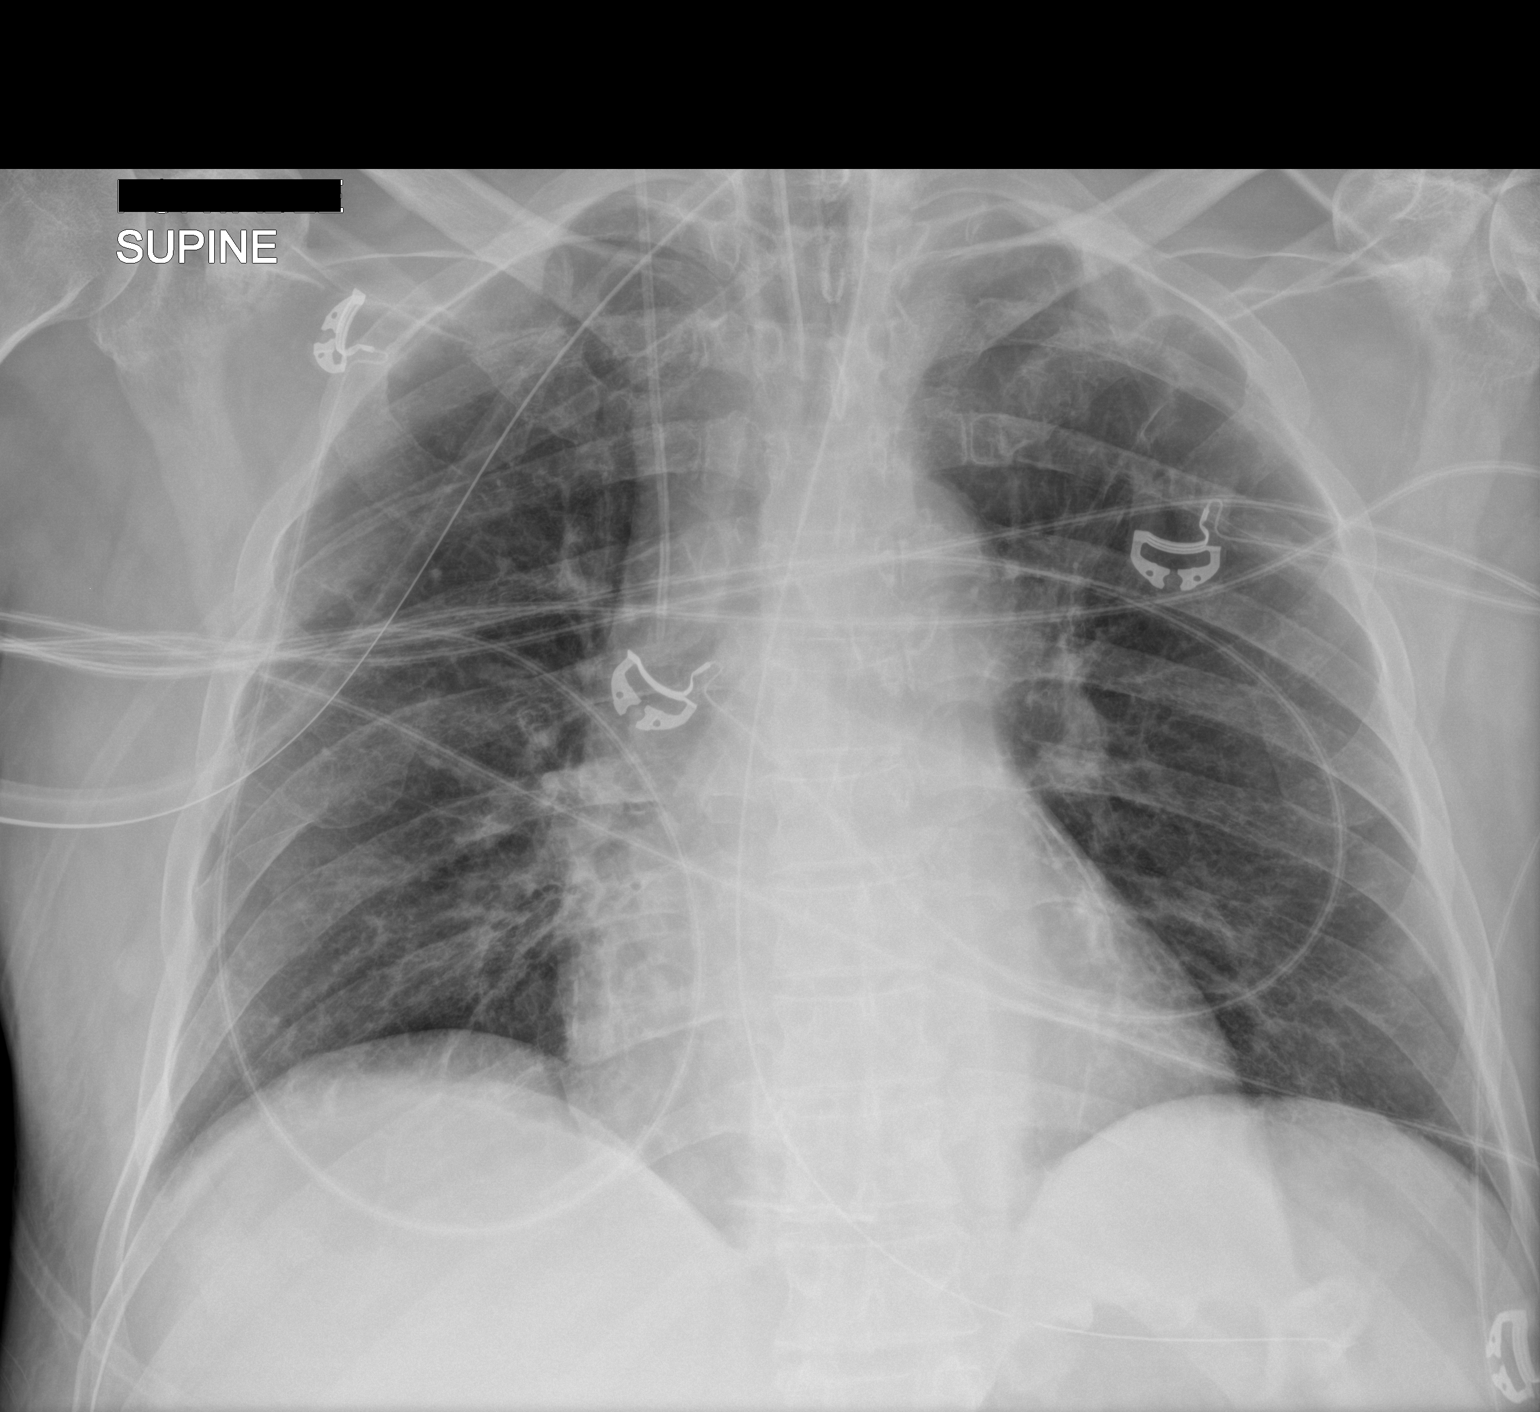

[1 of 1 positions shown; findings below may reference images not displayed]

FINDINGS: There is mild cardiomegaly. Aortic knob calcifications. ETT is
cm above the carina. A right-sided central venous catheter seen with
the tip in the mid SVC. OG tube is seen within the proximal stomach.
There is mild prominence of the central pulmonary vasculature. No
pneumothorax is seen.
IMPRESSION: Lines and tubes in satisfactory position.

## 2020-01-02 SURGERY — AMPUTATION, ABOVE KNEE
Anesthesia: General | Site: Knee | Laterality: Right

## 2020-01-02 MED ORDER — PANTOPRAZOLE SODIUM 40 MG IV SOLR
40.0000 mg | Freq: Every day | INTRAVENOUS | Status: DC
  Administered 2020-01-02 – 2020-01-04 (×3): 40 mg via INTRAVENOUS
  Filled 2020-01-02 (×3): qty 40

## 2020-01-02 MED ORDER — FENTANYL BOLUS VIA INFUSION
25.0000 ug | INTRAVENOUS | Status: DC | PRN
  Filled 2020-01-02: qty 25

## 2020-01-02 MED ORDER — DEXMEDETOMIDINE HCL IN NACL 400 MCG/100ML IV SOLN
0.4000 ug/kg/h | INTRAVENOUS | Status: DC
  Administered 2020-01-02: 0.3 ug/kg/h via INTRAVENOUS
  Administered 2020-01-02: 0.4 ug/kg/h via INTRAVENOUS
  Administered 2020-01-02: 0.6 ug/kg/h via INTRAVENOUS
  Filled 2020-01-02 (×3): qty 100

## 2020-01-02 MED ORDER — ROCURONIUM BROMIDE 50 MG/5ML IV SOLN: 50.0000 mg | Freq: Once | INTRAVENOUS | Status: AC

## 2020-01-02 MED ORDER — HYDROMORPHONE HCL 1 MG/ML IJ SOLN: 1.0000 mg | INTRAMUSCULAR | Status: AC

## 2020-01-02 MED ORDER — PROTHROMBIN COMPLEX CONC HUMAN 500 UNITS IV KIT
1500.0000 [IU] | PACK | Status: AC
  Filled 2020-01-02: qty 1500

## 2020-01-02 MED ORDER — MIDAZOLAM HCL 2 MG/2ML IJ SOLN
INTRAMUSCULAR | Status: AC
  Filled 2020-01-02: qty 2

## 2020-01-02 MED ORDER — VITAMIN K1 10 MG/ML IJ SOLN
10.0000 mg | INTRAVENOUS | Status: AC
  Administered 2020-01-02: 10 mg via INTRAVENOUS
  Filled 2020-01-02: qty 1

## 2020-01-02 MED ORDER — DOCUSATE SODIUM 50 MG/5ML PO LIQD
100.0000 mg | Freq: Two times a day (BID) | ORAL | Status: DC
  Administered 2020-01-02 – 2020-01-04 (×2): 100 mg via ORAL
  Filled 2020-01-02 (×4): qty 10

## 2020-01-02 MED ORDER — CHLORHEXIDINE GLUCONATE CLOTH 2 % EX PADS
6.0000 | MEDICATED_PAD | Freq: Every day | CUTANEOUS | Status: DC
  Administered 2020-01-02 – 2020-01-04 (×3): 6 via TOPICAL

## 2020-01-02 MED ORDER — LACTATED RINGERS IV SOLN: INTRAVENOUS | Status: DC | PRN

## 2020-01-02 MED ORDER — NOREPINEPHRINE 4 MG/250ML-% IV SOLN
INTRAVENOUS | Status: AC
  Filled 2020-01-02: qty 250

## 2020-01-02 MED ORDER — MIDAZOLAM HCL 2 MG/2ML IJ SOLN
INTRAMUSCULAR | Status: DC | PRN
  Administered 2020-01-02: 2 mg via INTRAVENOUS

## 2020-01-02 MED ORDER — PROPOFOL 10 MG/ML IV BOLUS
INTRAVENOUS | Status: AC
  Filled 2020-01-02: qty 20

## 2020-01-02 MED ORDER — AMIODARONE HCL IN DEXTROSE 360-4.14 MG/200ML-% IV SOLN
30.0000 mg/h | INTRAVENOUS | Status: DC
  Administered 2020-01-02: 60 mg/h via INTRAVENOUS
  Administered 2020-01-02: 59.9999 mg/h via INTRAVENOUS
  Administered 2020-01-02: 30 mg/h via INTRAVENOUS
  Administered 2020-01-02: 59.9999 mg/h via INTRAVENOUS
  Administered 2020-01-02 – 2020-01-04 (×3): 30 mg/h via INTRAVENOUS
  Filled 2020-01-02 (×6): qty 200

## 2020-01-02 MED ORDER — FENTANYL CITRATE (PF) 100 MCG/2ML IJ SOLN
INTRAMUSCULAR | Status: AC
  Filled 2020-01-02: qty 2

## 2020-01-02 MED ORDER — SODIUM CHLORIDE 0.9% IV SOLUTION: Freq: Once | INTRAVENOUS | Status: DC

## 2020-01-02 MED ORDER — MUPIROCIN 2 % EX OINT
1.0000 "application " | TOPICAL_OINTMENT | Freq: Two times a day (BID) | CUTANEOUS | Status: DC
  Administered 2020-01-02 – 2020-01-04 (×4): 1 via NASAL
  Filled 2020-01-02: qty 22

## 2020-01-02 MED ORDER — ETOMIDATE 2 MG/ML IV SOLN
INTRAVENOUS | Status: AC
  Administered 2020-01-02: 20 mg via INTRAVENOUS
  Filled 2020-01-02: qty 10

## 2020-01-02 MED ORDER — ROCURONIUM BROMIDE 50 MG/5ML IV SOLN
INTRAVENOUS | Status: AC
  Administered 2020-01-02: 50 mg via INTRAVENOUS
  Filled 2020-01-02: qty 1

## 2020-01-02 MED ORDER — FENTANYL 2500MCG IN NS 250ML (10MCG/ML) PREMIX INFUSION
25.0000 ug/h | INTRAVENOUS | Status: DC
  Administered 2020-01-02: 25 ug/h via INTRAVENOUS
  Administered 2020-01-03: 100 ug/h via INTRAVENOUS
  Administered 2020-01-04: 150 ug/h via INTRAVENOUS
  Filled 2020-01-02 (×3): qty 250

## 2020-01-02 MED ORDER — SODIUM CHLORIDE 0.9 % IV SOLN: 250.0000 mL | INTRAVENOUS | Status: DC

## 2020-01-02 MED ORDER — FENTANYL CITRATE (PF) 100 MCG/2ML IJ SOLN: 25.0000 ug | Freq: Once | INTRAMUSCULAR | Status: DC

## 2020-01-02 MED ORDER — FENTANYL CITRATE (PF) 100 MCG/2ML IJ SOLN
100.0000 ug | Freq: Once | INTRAMUSCULAR | Status: AC
  Administered 2020-01-02 (×2): 100 ug via INTRAVENOUS

## 2020-01-02 MED ORDER — PHENYLEPHRINE CONCENTRATED 100MG/250ML (0.4 MG/ML) INFUSION SIMPLE
0.0000 ug/min | INTRAVENOUS | Status: DC
  Administered 2020-01-02: 100 ug/min via INTRAVENOUS
  Filled 2020-01-02 (×2): qty 250

## 2020-01-02 MED ORDER — NOREPINEPHRINE 4 MG/250ML-% IV SOLN
2.0000 ug/min | INTRAVENOUS | Status: DC
  Administered 2020-01-02: 5 ug/min via INTRAVENOUS

## 2020-01-02 MED ORDER — NOREPINEPHRINE 16 MG/250ML-% IV SOLN
0.0000 ug/min | INTRAVENOUS | Status: DC
  Administered 2020-01-02: 15 ug/min via INTRAVENOUS
  Administered 2020-01-03: 4 ug/min via INTRAVENOUS
  Filled 2020-01-02 (×2): qty 250

## 2020-01-02 MED ORDER — AMIODARONE HCL IN DEXTROSE 360-4.14 MG/200ML-% IV SOLN
60.0000 mg/h | INTRAVENOUS | Status: DC
  Administered 2020-01-02: 60 mg/h via INTRAVENOUS
  Filled 2020-01-02: qty 200

## 2020-01-02 MED ORDER — AMIODARONE LOAD VIA INFUSION
150.0000 mg | Freq: Once | INTRAVENOUS | Status: DC
  Filled 2020-01-02: qty 83.34

## 2020-01-02 MED ORDER — ETOMIDATE 2 MG/ML IV SOLN: 20.0000 mg | Freq: Once | INTRAVENOUS | Status: AC

## 2020-01-02 MED ORDER — HYDROMORPHONE HCL 1 MG/ML IJ SOLN
INTRAMUSCULAR | Status: AC
  Filled 2020-01-02: qty 1

## 2020-01-02 MED ORDER — VANCOMYCIN HCL IN DEXTROSE 1-5 GM/200ML-% IV SOLN
1000.0000 mg | Freq: Two times a day (BID) | INTRAVENOUS | Status: DC
  Administered 2020-01-02 – 2020-01-04 (×4): 1000 mg via INTRAVENOUS
  Filled 2020-01-02 (×5): qty 200

## 2020-01-02 MED ORDER — VASOPRESSIN 20 UNIT/ML IV SOLN
INTRAVENOUS | Status: DC | PRN
  Administered 2020-01-02 (×3): 2 [IU] via INTRAVENOUS
  Administered 2020-01-02: 1 [IU] via INTRAVENOUS
  Administered 2020-01-02 (×3): 2 [IU] via INTRAVENOUS
  Administered 2020-01-02 (×2): 1 [IU] via INTRAVENOUS
  Administered 2020-01-02: 2 [IU] via INTRAVENOUS

## 2020-01-02 MED ORDER — ALBUMIN HUMAN 5 % IV SOLN
INTRAVENOUS | Status: DC | PRN
Start: 2020-01-02 — End: 2020-01-02

## 2020-01-02 MED ORDER — PROPOFOL 1000 MG/100ML IV EMUL
INTRAVENOUS | Status: AC
  Administered 2020-01-02: 50 ug/kg/min via INTRAVENOUS
  Filled 2020-01-02: qty 100

## 2020-01-02 MED ORDER — HYDROCORTISONE NA SUCCINATE PF 100 MG IJ SOLR
50.0000 mg | Freq: Four times a day (QID) | INTRAMUSCULAR | Status: DC
  Administered 2020-01-02 – 2020-01-04 (×9): 50 mg via INTRAVENOUS
  Filled 2020-01-02 (×9): qty 2

## 2020-01-02 MED ORDER — SODIUM CHLORIDE 0.9 % IV SOLN
25.0000 ug/min | INTRAVENOUS | Status: DC
  Filled 2020-01-02 (×2): qty 1

## 2020-01-02 MED ORDER — PHENYLEPHRINE HCL (PRESSORS) 10 MG/ML IV SOLN
INTRAVENOUS | Status: DC | PRN
  Administered 2020-01-02: 200 ug via INTRAVENOUS
  Administered 2020-01-02: 100 ug via INTRAVENOUS
  Administered 2020-01-02: 200 ug via INTRAVENOUS

## 2020-01-02 MED ORDER — POLYETHYLENE GLYCOL 3350 17 G PO PACK
17.0000 g | PACK | Freq: Every day | ORAL | Status: DC
  Filled 2020-01-02: qty 1

## 2020-01-02 MED ORDER — PROPOFOL 10 MG/ML IV BOLUS
INTRAVENOUS | Status: DC | PRN
  Administered 2020-01-02: 30 mg via INTRAVENOUS

## 2020-01-02 MED ORDER — VASOPRESSIN 20 UNIT/ML IV SOLN
0.0300 [IU]/min | INTRAVENOUS | Status: DC
  Administered 2020-01-02: .03 [IU]/min via INTRAVENOUS
  Administered 2020-01-03 – 2020-01-04 (×2): 0.03 [IU]/min via INTRAVENOUS
  Filled 2020-01-02 (×3): qty 2

## 2020-01-02 MED ORDER — PROPOFOL 1000 MG/100ML IV EMUL
0.0000 ug/kg/min | INTRAVENOUS | Status: DC
  Administered 2020-01-02: 30 ug/kg/min via INTRAVENOUS
  Administered 2020-01-02: 20 ug/kg/min via INTRAVENOUS
  Administered 2020-01-03 – 2020-01-04 (×5): 40 ug/kg/min via INTRAVENOUS
  Filled 2020-01-02 (×7): qty 100

## 2020-01-02 SURGICAL SUPPLY — 17 items
BLADE SURG 10 STRL SS SAFETY (BLADE) ×2 IMPLANT
CANISTER WOUND CARE 500ML ATS (WOUND CARE) ×1 IMPLANT
DRAPE INCISE IOBAN 66X45 STRL (DRAPES) ×2 IMPLANT
DRAPE LAPAROTOMY 100X77 ABD (DRAPES) ×1 IMPLANT
DRSG VAC ATS LRG SENSATRAC (GAUZE/BANDAGES/DRESSINGS) ×1 IMPLANT
ELECT CAUTERY BLADE TIP 2.5 (TIP) ×2
ELECTRODE CAUTERY BLDE TIP 2.5 (TIP) IMPLANT
HEMOSTAT SNOW SURGICEL 2X4 (HEMOSTASIS) ×1 IMPLANT
PACK BASIN MAJOR ARMC (MISCELLANEOUS) ×1 IMPLANT
SPONGE LAP 18X18 RF (DISPOSABLE) ×1 IMPLANT
SUT SILK 2 0 SH (SUTURE) ×2 IMPLANT
SUT SILK 3-0 (SUTURE) ×1
SUT SILK 3-0 SH-1 18XCR BRD (SUTURE) ×1
SUT SILK 4 0 (SUTURE) ×1
SUT SILK 4-0 18XBRD TIE 12 (SUTURE) IMPLANT
SUTURE SILK 3-0 SH-1 18XCR BRD (SUTURE) IMPLANT
YANKAUER SUCT BULB TIP FLEX NO (MISCELLANEOUS) ×1 IMPLANT

## 2020-01-02 NOTE — Transfer of Care (Signed)
Immediate Anesthesia Transfer of Care Note  Patient: [name].  Procedure(s) Performed: AMPUTATION ABOVE KNEE (Right Knee)  Patient Location: PACU and ICU  Anesthesia Type:General  Level of Consciousness: sedated  Airway & Oxygen Therapy: Patient Spontanous Breathing, Patient remains intubated per anesthesia plan and Patient placed on Ventilator (see vital sign flow sheet for setting)  Post-op Assessment: Report given to RN and Post -op Vital signs reviewed and stable  Post vital signs: Reviewed and stable  Last Vitals:  Vitals Value Taken Time  BP 102/53 01/23/2020 0404  Temp    Pulse 47 01/06/2020 0419  Resp 18 01/23/2020 0419  SpO2 97 % 01/14/2020 0419  Vitals shown include unvalidated device data.  Last Pain:  Vitals:   01/24/2020 0200  TempSrc: Oral  PainSc:          Complications: No apparent anesthesia complications

## 2020-01-02 NOTE — Anesthesia Preprocedure Evaluation (Signed)
Anesthesia Evaluation  Patient identified by MRN, date of birth, ID band Patient awake    Reviewed: Allergy & Precautions, H&P , NPO status , Patient's Chart, lab work & pertinent test results  Airway Mallampati: Intubated       Dental   Pulmonary former smoker,           Cardiovascular hypertension, + CAD and + Peripheral Vascular Disease  + dysrhythmias Atrial Fibrillation  Rhythm:irregular Rate:Tachycardia     Neuro/Psych CVA negative psych ROS   GI/Hepatic GERD  ,(+) Hepatitis -, C  Endo/Other  diabetes  Renal/GU      Musculoskeletal   Abdominal   Peds  Hematology  (+) Blood dyscrasia, anemia ,   Anesthesia Other Findings Profound anemia in the setting of massive blood loss from BKA site after wound vac was removed.  Intubated, on multiple pressors.  Has received 2 units pRBC and 1 unit FFP in ICU.  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 08/2018 CT Chest: Mild cor Ca2+. No date: Diabetes mellitus with complication ([place]) No date: Dilated aortic root ([facility])     Comment:  a. 04/2018 Echo: 4.1cm. Asc Ao 3.5cm; b. 04/2018 CT: Asc               Ao 3.6cm. 4.1cm @ sinus of Valsalva. No date: Dysrhythmia     Comment:  a fib 09/06/2019: Gangrene ([place]) 06/02/2018: Gangrene of right foot ([place]) No date: GERD (gastroesophageal reflux disease) No date: History of echocardiogram     Comment:  a. 04/2018 Echo: Ef 60-65%, no rwma,  midly to mod dil Ao               root - 4.1cm. Asc Ao 3.5cm. Mild MR. Nl RV fxn. Nl PASP. No date: History of hernia repair No date: History of stress test     Comment:  a. 2016 MV ([name]): EF 58%, no ischemia. 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 on Eliquis as of 2018; b. CHADS2VASc => 5 (HTN, DM,               stroke x 2, vascular disease) No date: Peripheral vascular disease ([place])     Comment:  a. followed by Dr. Lucky Cowboy; b. s/p kissing balloon stents               and right external iliac stent in 10/2017; c. 05/2018 s/p               R BKA; c. 08/2019: Bilat Iliac PTA & DBA or REIA. No date: Pulmonary nodules 06/11/2018: S/P bilateral BKA (below knee amputation) ([place]) No date: Stroke [facility])  Comment:  a. 2016 & 2018 07/11/2018: Stroke [facility])  Past Surgical History: No date: ABDOMINAL AORTA STENT     Comment:  x 2 06/02/2018: AMPUTATION; Right     Comment:  Procedure: AMPUTATION BELOW KNEE;  Surgeon: [name], MD;  Location: [facility] ORS;  Service: Vascular;                Laterality: Right; 10/26/2019: AMPUTATION; Right     Comment:  Procedure: RIGHT AMPUTATION ABOVE KNEE;  Surgeon: [name], MD;  Location: [facility] ORS;  Service: Vascular;                Laterality: Right; No date: APPENDECTOMY 05/12/2018: COLONOSCOPY WITH PROPOFOL; N/A     Comment:  Procedure: COLONOSCOPY WITH PROPOFOL;  Surgeon: [name], MD;  Location: [facility] ENDOSCOPY;  Service:               Gastroenterology;  Laterality: N/A; 05/12/2018: ESOPHAGOGASTRODUODENOSCOPY (EGD) WITH PROPOFOL; N/A     Comment:  Procedure: ESOPHAGOGASTRODUODENOSCOPY (EGD) WITH               PROPOFOL;  Surgeon:  [name], MD;  Location[facility]               ENDOSCOPY;  Service: Gastroenterology;  Laterality: N/A; 07/13/2018: GIVENS CAPSULE STUDY; N/A     Comment:  Procedure: GIVENS CAPSULE STUDY;  Surgeon: [name],               MD;  Location: [facility] ENDOSCOPY;  Service:               Gastroenterology;  Laterality: N/A; No date: HERNIA REPAIR     Comment:  UMBILICAL [id number]: INCISION AND DRAINAGE; Right     Comment:  Procedure: INCISION AND DRAINAGE RIGHT ABOVE THE KNEE               STUMP;  Surgeon: Evaristo Bury, MD;  Location: [facility]               ORS;  Service: Vascular;  Laterality: Right; 10/26/2019: LEG AMPUTATION; Right     Comment:  prior right BKA required AKA for non-healing ulcer and               wound infection 10/25/2017: LOWER EXTREMITY ANGIOGRAPHY; Right     Comment:  Procedure: LOWER EXTREMITY ANGIOGRAPHY;  Surgeon: [name], MD;  Location: [place] CV LAB;  Service:               Cardiovascular;  Laterality: Right; 01/13/2018: LOWER EXTREMITY ANGIOGRAPHY; Right     Comment:  Procedure: LOWER EXTREMITY ANGIOGRAPHY;  Surgeon: [name], MD;  Location: [place] CV LAB;  Service:               Cardiovascular;  Laterality: Right; 08/28/2019: LOWER EXTREMITY ANGIOGRAPHY; Right     Comment:  Procedure: LOWER EXTREMITY ANGIOGRAPHY;  Surgeon: [name], MD;  Location: [place] CV LAB;  Service:  Cardiovascular;  Laterality: Right; 10/25/2017: LOWER EXTREMITY INTERVENTION     Comment:  Procedure: LOWER EXTREMITY INTERVENTION;  Surgeon: [name], MD;  Location: [place] CV LAB;  Service:               Cardiovascular;; 12/28/2018: SKIN SPLIT GRAFT; Right     Comment:  Procedure: SKIN GRAFT SPLIT THICKNESS ( SYNTHETIC );                Surgeon: [name], MD;  Location: [facility] ORS;  Service:              Vascular;  Laterality: Right; No date: TONSILLECTOMY 08/08/2018: WOUND  DEBRIDEMENT; Right     Comment:  Procedure: DEBRIDEMENT WOUND WITH WOUND VAC APPLICATION;              Surgeon: [name], MD;  Location: [facility] ORS;  Service:              Vascular;  Laterality: Right;  BMI    Body Mass Index: 22.14 kg/m      Reproductive/Obstetrics negative OB ROS                             Anesthesia Physical Anesthesia Plan  ASA: IV and emergent  Anesthesia Plan: General ETT   Post-op Pain Management:    Induction:   PONV Risk Score and Plan: Midazolam and Treatment may vary due to age or medical condition  Airway Management Planned:   Additional Equipment:   Intra-op Plan:   Post-operative Plan:   Informed Consent: I have reviewed the patients History and Physical, chart, labs and discussed the procedure including the risks, benefits and alternatives for the proposed anesthesia with the patient or authorized representative who has indicated his/her understanding and acceptance.     Dental Advisory Given  Plan Discussed with: Anesthesiologist, CRNA and Surgeon  Anesthesia Plan Comments:         Anesthesia Quick Evaluation

## 2020-01-02 NOTE — Procedures (Signed)
Central Venous Catheter Insertion Procedure Note [name] [id number] 1955/11/21  Procedure: Insertion of Central Venous Catheter Indications: Assessment of intravascular volume, Drug and/or fluid administration and Frequent blood sampling  Procedure Details Consent: Unable to obtain consent because of emergent medical necessity. Time Out: Verified patient identification, verified procedure, site/side was marked, verified correct patient position, special equipment/implants available, medications/allergies/relevent history reviewed, required imaging and test results available.  Performed  Maximum sterile technique was used including antiseptics, cap, gloves, gown, hand hygiene, mask and sheet. Skin prep: Chlorhexidine; local anesthetic administered A antimicrobial bonded/coated triple lumen catheter was placed in the right internal jugular vein using the Seldinger technique.  Evaluation Blood flow good Complications: No apparent complications Patient did tolerate procedure well. Chest X-ray ordered to verify placement.  CXR: normal.  Right internal jugular central line placed utilizing ultrasound no complications noted during or following procedure.   [name], [name] Pager [phone] (please enter 7 digits) PCCM Consult Pager [phone] (please enter 7 digits)

## 2020-01-02 NOTE — Progress Notes (Signed)
Pt started bleeding from the stump on his left AKA, Rapid response was activated, NP on call was notified; got order to bolus pt with 1 L of N/S; NP on her way to see pt right away. Reinforced dressings to stop the bleeding, MD Brabham was notified by NP. He came in to see pt. New orders to transfer pt to step down, plasma unit started before transferring pt to CCU. Pt's spouse was at bedside. Continue to monitor.

## 2020-01-02 NOTE — ACP (Advance Care Planning) (Signed)
[place] updated EPIC health care angent to reflect AD on file: [name] named HCPOA.  Pt indicates in Living Will he does NOT want life-prolonging treatment in situation of illness leading to imminent death, permanent unconsciousness, or loss of ability to think due to dementia or a similar condition.  Please see scanned docoment in [place] with questions.

## 2020-01-02 NOTE — Procedures (Signed)
Endotracheal Intubation Procedure Note  Indication for endotracheal intubation: impending respiratory failure. Airway Assessment: Mallampati Class: II (hard and soft palate, upper portion of tonsils anduvula visible). Sedation: etomidate and fentanyl. Paralytic: rocuronium. Lidocaine: no. Atropine: no. Equipment: glidescope utilized. Cricoid Pressure: no. Number of attempts: 1. ETT location confirmed by by auscultation, by CXR and ETCO2 monitor.  Pt profusely bleeding from right above the knee amputation with concern for ability to protect airway.  Vascular surgery contacted regarding profuse bleeding at right above the knee amputation site, requested mechanical intubation.  Therefore, proceeded with emergent mechanical intubation and pt tolerated procedure.  Vascular surgeon arrived at bedside pt to go for emergent surgery.  [name], [place] Pager [phone] (please enter 7 digits) PCCM Consult Pager [phone] (please enter 7 digits)

## 2020-01-02 NOTE — Progress Notes (Addendum)
The patient was transferred to the ICU for closer monitoring.  He started to bleed again from his stump and became unstable requiring intubation.  A tournaquet was placed on the thigh.  When I evaluated him, he had blown out an area on the posterior side of his stump.  There was a large cavity with active bleeding.  I packed this with kerlix.  He needs to goto the OR emergently for surgical repair, likely a more proximal amputation.  He is currently on high dose pressors and remains hypotensive with SBP around 70.  He has received blood and FFP as well as [place].  Wife updated via phone  Annamarie Major

## 2020-01-02 NOTE — Progress Notes (Signed)
Pt returned from ICU and placed back on the vent with previous settings. Pt tol well at this time.

## 2020-01-02 NOTE — Progress Notes (Signed)
[facility] Daily Progress Note  Subjective: 01/29/2020: #1: Redo right above-knee amputation (guillotine) #2: Placement of wound VAC  Patient is sedated and on vent.  Wife at bedside.  Objective: Vitals:   01/12/2020 0600 01/18/2020 0800 01/06/2020 0900 01/27/2020 1000  BP: 137/73 110/75 (!) 156/79 (!) 141/83  Pulse: 91 93 88 84  Resp: (!) 21 19 20 20   Temp:  97.7 F (36.5 C)    TempSrc:  Oral    SpO2: 98% 99% 98% 98%  Weight: 82.1 kg     Height:        Intake/Output Summary (Last 24 hours) at 01/08/2020 1201 Last data filed at 01/24/2020 1115 Gross per 24 hour  Intake 4830 ml  Output --  Net 4830 ml   Physical Exam: Sedated and on vent, NAD CV: Tachycardic Pulmonary: Decreased bilaterally Abdomen: Soft, Nontender, Nondistended Vascular:  Right lower extremity: AKA stump with VAC intact and to suction.  Thigh soft.   Laboratory: CBC    Component Value Date/Time   WBC 23.3 (H) 01/21/2020 0424   HGB 9.4 (L) 01/16/2020 0941   HGB 11.2 (L) 10/23/2019 1403   HCT 27.4 (L) 01/26/2020 0941   HCT 35.0 (L) 10/23/2019 1403   PLT 125 (L) 01/19/2020 0424   PLT 199 10/23/2019 1403   BMET    Component Value Date/Time   NA 134 (L) 01/04/2020 0424   NA 140 10/23/2019 1403   K 3.9 01/22/2020 0424   CL 108 01/04/2020 0424   CO2 16 (L) 01/14/2020 0424   GLUCOSE 264 (H) 01/21/2020 0424   BUN 18 01/06/2020 0424   BUN 17 10/23/2019 1403   CREATININE 0.88 01/27/2020 0424   CALCIUM 6.5 (L) 01/26/2020 0424   GFRNONAA >60 01/08/2020 0424   GFRAA >60 01/10/2020 0424   Assessment/Planning: The patient is a 64 year old gentleman who has previously undergone a right above-knee amputation which did require debridement about a month ago for infection.  He presented to the emergency department on 12/02/2019 with pain in his stump as well as fevers. Examination in the emergency department was notable for healthy appearing layer of granulation tissue on his stump.  Throughout the  day he had several episodes of bleeding which appeared to be from the granulation tissue given his coagulopathy and anticoagulation.  Pressure dressings were applied but were unsuccessful.  He ultimately ended up in the ICU with significant bleeding requiring intubation and a tourniquet.   1) s/p redo right above-knee amputation (guillotine), placement of wound VAC -Patient is currently on 2 pressors -Patient is sedated and vented -At some point, the patient will need to return to the operating room to undergo wound exploration and VAC dressing change.  Tentatively will for Thursday however it depends on how stable the patient is. - now with positive blood cultures  Appreciate assistance from ICU team Discussed with Dr. Ellis Parents [facility] PA-C 01/21/2020 12:01 PM

## 2020-01-02 NOTE — Progress Notes (Signed)
MEDICATION RELATED CONSULT NOTE - INITIAL   Pharmacy Consult for [facility]) Indication: hemorrhagic shock s/t profuse AKA stump bleeding  Allergies  Allergen Reactions  . Sodium Pentobarbital [Pentobarbital] Shortness Of Breath  . Lipitor [Atorvastatin] Rash    Patient Measurements: Height: 5\' 9"  (175.3 cm) Weight: 68 kg (149 lb 14.6 oz) IBW/kg (Calculated) : 70.7  Vital Signs: Temp: 98.7 F (37.1 C) (06/01 0200) Temp Source: Oral (06/01 0200) BP: 105/73 (05/31 2247) Pulse Rate: 169 (05/31 2247) Intake/Output from previous day: 05/31 0701 - 06/01 0700 In: 2590 [P.O.:360; Blood:680; IV Piggyback:1550] Out: -  Intake/Output from this shift: Total I/O In: 1040 [P.O.:360; Blood:680] Out: -   Labs: Recent Labs    12/28/2019 0358 12/26/2019 1712 12/07/2019 2037  WBC 12.8* 13.8* 12.7*  HGB 9.3* 8.3* 6.4*  HCT 29.1* 26.9* 21.1*  PLT 229 223 202  CREATININE 0.69  --   --   ALBUMIN 3.4*  --   --   PROT 7.3  --   --   AST 18  --   --   ALT 14  --   --   ALKPHOS 105  --   --   BILITOT 1.1  --   --    Estimated Creatinine Clearance: 88.5 mL/min (by C-G formula based on SCr of 0.69 mg/dL).   Microbiology: Recent Results (from the past 720 hour(s))  Culture, blood (routine x 2)     Status: None (Preliminary result)   Collection Time: 12/20/2019  3:58 AM   Specimen: Left Antecubital; Blood  Result Value Ref Range Status   Specimen Description LEFT ANTECUBITAL  Final   Special Requests   Final    BOTTLES DRAWN AEROBIC AND ANAEROBIC Blood Culture results may not be optimal due to an excessive volume of blood received in culture bottles   Culture  Setup Time   Final    GRAM POSITIVE COCCI IN BOTH AEROBIC AND ANAEROBIC BOTTLES CRITICAL VALUE NOTED.  VALUE IS CONSISTENT WITH PREVIOUSLY REPORTED AND CALLED VALUE. Performed at [facility], [address] [place], [place] [address]    Culture [facility] POSITIVE COCCI  Final   Report Status PENDING  Incomplete   Culture, blood (routine x 2)     Status: None (Preliminary result)   Collection Time: 12/19/2019  3:58 AM   Specimen: BLOOD LEFT HAND  Result Value Ref Range Status   Specimen Description BLOOD LEFT HAND  Final   Special Requests   Final    BOTTLES DRAWN AEROBIC AND ANAEROBIC Blood Culture results may not be optimal due to an excessive volume of blood received in culture bottles   Culture  Setup Time   Final    Organism ID to follow [place] AND ANAEROBIC BOTTLES CRITICAL RESULT CALLED TO, READ BACK BY AND VERIFIED WITH: [place] @1907  12/26/2019 AKT Performed at [facility], [address] [place], [place] [address]    Culture [facility] POSITIVE COCCI  Final   Report Status PENDING  Incomplete  SARS Coronavirus 2 by RT PCR (hospital order, performed in [facility] hospital lab) Nasopharyngeal Nasopharyngeal Swab     Status: None   Collection Time: 12/04/2019  3:58 AM   Specimen: Nasopharyngeal Swab  Result Value Ref Range Status   SARS Coronavirus 2 NEGATIVE NEGATIVE Final    Comment: (NOTE) SARS-CoV-2 target nucleic acids are NOT DETECTED. The SARS-CoV-2 RNA is generally detectable in upper and lower respiratory specimens during the acute phase of infection.  The lowest concentration of SARS-CoV-2 viral copies this assay can detect is 250 copies / mL. A negative result does not preclude SARS-CoV-2 infection and should not be used as the sole basis for treatment or other patient management decisions.  A negative result may occur with improper specimen collection / handling, submission of specimen other than nasopharyngeal swab, presence of viral mutation(s) within the areas targeted by this assay, and inadequate number of viral copies (<250 copies / mL). A negative result must be combined with clinical observations, patient history, and epidemiological information. Fact Sheet for Patients:   StrictlyIdeas.no Fact Sheet for  Healthcare Providers: BankingDealers.co.za This test is not yet approved or cleared  by the [place] FDA and has been authorized for detection and/or diagnosis of SARS-CoV-2 by FDA under an Emergency Use Authorization (EUA).  This EUA will remain in effect (meaning this test can be used) for the duration of the COVID-19 declaration under Section 564(b)(1) of the Act, 21 U.S.C. section 360bbb-3(b)(1), unless the authorization is terminated or revoked sooner. Performed at [facility], [address] [place], [place] [address]   Blood Culture ID Panel (Reflexed)     Status: Abnormal   Collection Time: 12/12/2019  3:58 AM  Result Value Ref Range Status   Enterococcus species NOT DETECTED NOT DETECTED Final   Listeria monocytogenes NOT DETECTED NOT DETECTED Final   Staphylococcus species DETECTED (A) NOT DETECTED Final    Comment: CRITICAL RESULT CALLED TO, READ BACK BY AND VERIFIED WITH: CHRIS MORAN @1907  12/11/2019 AKT    Staphylococcus aureus (BCID) DETECTED (A) NOT DETECTED Final    Comment: Methicillin (oxacillin)-resistant Staphylococcus aureus (MRSA). MRSA is predictably resistant to beta-lactam antibiotics (except ceftaroline). Preferred therapy is vancomycin unless clinically contraindicated. Patient requires contact precautions if  hospitalized. CRITICAL RESULT CALLED TO, READ BACK BY AND VERIFIED WITH: CHRIS MORAN @1907  12/13/2019 AKT    Methicillin resistance DETECTED (A) NOT DETECTED Final    Comment: CRITICAL RESULT CALLED TO, READ BACK BY AND VERIFIED WITH: CHRIS MORAN @1907  12/03/2019 AKT    Streptococcus species NOT DETECTED NOT DETECTED Final   Streptococcus agalactiae NOT DETECTED NOT DETECTED Final   Streptococcus pneumoniae NOT DETECTED NOT DETECTED Final   Streptococcus pyogenes NOT DETECTED NOT DETECTED Final   Acinetobacter baumannii NOT DETECTED NOT DETECTED Final   Enterobacteriaceae species NOT DETECTED NOT DETECTED Final    Enterobacter cloacae complex NOT DETECTED NOT DETECTED Final   Escherichia coli NOT DETECTED NOT DETECTED Final   Klebsiella oxytoca NOT DETECTED NOT DETECTED Final   Klebsiella pneumoniae NOT DETECTED NOT DETECTED Final   Proteus species NOT DETECTED NOT DETECTED Final   Serratia marcescens NOT DETECTED NOT DETECTED Final   Haemophilus influenzae NOT DETECTED NOT DETECTED Final   Neisseria meningitidis NOT DETECTED NOT DETECTED Final   Pseudomonas aeruginosa NOT DETECTED NOT DETECTED Final   Candida albicans NOT DETECTED NOT DETECTED Final   Candida glabrata NOT DETECTED NOT DETECTED Final   Candida krusei NOT DETECTED NOT DETECTED Final   Candida parapsilosis NOT DETECTED NOT DETECTED Final   Candida tropicalis NOT DETECTED NOT DETECTED Final    Comment: Performed at [facility], [address] [place], [place] [address]    Medical History: Past Medical History:  Diagnosis Date  . Acute left PCA stroke ([place]) 02/17/2015  . Anemia    vitamin b and vit d deficiencies  . 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 Atherosclerosis of native arteries of the  extremities with gangrene ([place]) 05/08/2018  . Atrial fibrillation ([place])   . Blind    right eye  . Complication of anesthesia 2019   disoriented, trying to jump out of bed after bka  . Coronary artery calcification seen on CT scan    a. 08/2018 CT Chest: Mild cor Ca2+.  . Diabetes mellitus with complication ([place])   . Dilated aortic root ([place])    a. 04/2018 Echo: 4.1cm. Asc Ao 3.5cm; b. 04/2018 CT: Asc Ao 3.6cm. 4.1cm @ sinus of Valsalva.  . Dysrhythmia    a fib  . Gangrene ([place]) 09/06/2019  . Gangrene of right foot ([place]) 06/02/2018  . GERD (gastroesophageal reflux disease)   . History of echocardiogram    a. 04/2018 Echo: Ef 60-65%, no rwma, midly to mod dil Ao root - 4.1cm. Asc Ao 3.5cm. Mild MR. Nl RV fxn. Nl PASP.  Marland Kitchen History of hernia repair   . History of stress test    a. 2016 MV ([name]):  EF 58%, no ischemia.  Marland Kitchen Hx of BKA, right ([place]) 06/16/2018  . Hyperlipidemia   . Hypertension   . Leg pain   . Legally blind    right eye homonymous hemianopsa  . Memory change    d/t strokes  . Necrotic toes ([place]) 02/21/2018  . PAF (paroxysmal atrial fibrillation) ([name])    a. on Eliquis as of 2018; b. CHADS2VASc => 5 (HTN, DM, stroke x 2, vascular disease)  . Peripheral vascular disease ([place])    a. followed by Dr. Lucky Cowboy; b. s/p kissing balloon stents and right external iliac stent in 10/2017; c. 05/2018 s/p R BKA; c. 08/2019: Bilat Iliac PTA & DBA or REIA.  . Pulmonary nodules   . S/P bilateral BKA (below knee amputation) ([place]) 06/11/2018  . Stroke [facility])    a. 2016 & 2018  . Stroke ([place]) 07/11/2018    Medications:  Scheduled:  . [MAR Hold] sodium chloride   Intravenous Once  . [MAR Hold] sodium chloride   Intravenous Once  . [MAR Hold] sodium chloride   Intravenous Once  . [MAR Hold] amiodarone  150 mg Intravenous Once  . fentaNYL      . [MAR Hold] fentaNYL (SUBLIMAZE) injection  100 mcg Intravenous Once  . HYDROmorphone      . [MAR Hold]  HYDROmorphone (DILAUDID) injection  1 mg Intravenous STAT  . [MAR Hold] insulin aspart  0-5 Units Subcutaneous QHS  . [MAR Hold] insulin aspart  0-9 Units Subcutaneous TID WC    Assessment: Patient w/ h/o afib anticoagulated w/ eliquis PTA, cirrhosis, DM, BKA >>AKA, initially admitted for fever s/t AKA procedure site cellulitis was changed from wound VAC to wet to dry dressing per vascular. Patient began requiring step-down level care d/t profuse bleeding from AKA site, eliquis was originally held d/t anticipation of further I&D; however, patient has required 1 unit FFP, 1 unit pRBCs, phenylephrine, levophed for hypotension d/t hemorrhagic shock.   Patient currently in OR for cauterization/I&D of AKA site.   Patient's INR on admission also elevated at 2.8 (b/l has gone from 1.2 one-year to two- months ago to 1.9 >> 2.8 a month ago, w/ most recent  at 2.8) appears to be an acute going on chronic hepatitis B infx w/ acute manifestations of liver cirrhosis s/t hep B infx. (surface antigen, E-protein membrane antigen, core antigen antibodies positive w/ negative IgM suggesting a transition to a more chronic infx).  Goal of Therapy:  Reversal of stump bleeding, normalization of  patient's hgb roughly 9.3 - 9.5 PTA. Prevention of any clotting complications (PE, DVT, VTE).  Plan:  Patient was given Kcentra 1500 units IV x 1 for reversal of bleeding complications from eliquis per fixed dosing recommendations. Given patient received FFP & Eppie Gibson, advised critical team to monitor for any clotting complications given from the combinations. Will continue to monitor INR/CBC's and for any s/sx of clotting manifestations.  Tobie Lords, PharmD, BCPS Clinical Pharmacist 01/19/2020,2:42 AM

## 2020-01-02 NOTE — Progress Notes (Signed)
   12/05/2019 2019  Assess: MEWS Score  Temp 98.2 F (36.8 C)  BP 105/80  Pulse Rate 84  Resp 20  SpO2 100 %  Assess: MEWS Score  MEWS Temp 0  MEWS Systolic 0  MEWS Pulse 0  MEWS RR 0  MEWS LOC 0  MEWS Score 0  MEWS Score Color Green  Assess: if the MEWS score is Yellow or Red  Were vital signs taken at a resting state? Yes  Focused Assessment Documented focused assessment  Early Detection of Sepsis Score *See Row Information* Low  MEWS guidelines implemented *See Row Information* Yes  Treat  MEWS Interventions Administered prn meds/treatments (called Rapid Response and notifed NP on call)  Take Vital Signs  Increase Vital Sign Frequency  Red: Q 1hr X 4 then Q 4hr X 4, if remains red, continue Q 4hrs  Escalate  MEWS: Escalate Yellow: discuss with charge nurse/RN and consider discussing with provider and RRT  Notify: Charge Nurse/RN  Name of Charge Nurse/RN Notified Deboroah Gladding, Rn  Date Charge Nurse/RN Notified 12/07/2019  Time Charge Nurse/RN Notified 2000  Notify: Provider  Provider Name/Title Rufina Falco, NP  Date Provider Notified 12/12/2019  Time Provider Notified 2000  Notification Type Page  Notification Reason Change in status  Response See new orders  Date of Provider Response 12/31/2019  Time of Provider Response 2000  Notify: Rapid Response  Name of Rapid Response RN Notified ICU Rn  Date Rapid Response Notified 12/08/2019  Time Rapid Response Notified 2000  Document  Patient Outcome Transferred/level of care increased (tx to Step down)

## 2020-01-02 NOTE — Progress Notes (Signed)
   12/16/2019 2000  Clinical Encounter Type  Visited With Patient and family together  Visit Type Initial  Referral From Nurse  Consult/Referral To Chaplain  Spiritual Encounters  Spiritual Needs Prayer;Emotional  [facility] received page at 2000 to visit pt. Upon arrival, pt was in noticeable pain. Eyebrows were furrowed. Pt was grunting. Could hardly speak due to pain. Was grabbing right thigh. 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 A loved one of pt was present. [place] introduced self and prayer was requested. [place] provided pastoral care through prayer. Pastoral visit was appreciated.

## 2020-01-02 NOTE — Consult Note (Signed)
NAME: Glenn B Isham Jr.  DOB: [birth year]  MRN: [id number]  Date/Time: 01/28/2020 5:59 PM  REQUESTING PROVIDER: Dr.Amery Subjective:  REASON FOR CONSULT: MRSA bacteremia ? Glenn Fronda. is a 64 y.o. male with a history of cirrhosis, HEPB infection, PAD,  right AKA recent hospitalization, came to theED from home because of fever.  Patient recently was in the hospital between 11/10/2019 until 11/29/2019 for rt aka stump necrosis- he has a complicated PAD history He underwent RT BKA on 06/02/2018  For gangrene rt foot.Complicated by poor wound healing  With debridement 08/08/18 and skin graft 12/28/18 Non healing wound leading to RT AKA on 10/26/19. Necrosis of suture line and posterior flap of  rt AKA stump leading to I/D on 11/11/19 MRSA and enterococcus in culture treated with 2 weeks of antibiotic- vanco and then po linezolid + augmentin. He went to SNF and then got discharged home In the ED vitals where temperature of 101.8, blood pressure 162/133, heart rate of 111, pulse ox of 94%.  Blood cultures were sent and he was started on vancomycin, cefepime and Flagyl.  He was assessed by vascular surgeon who removed the wound VAC and saw that he had healthy granulation tissue on his stump.  But throughout the day he had several episodes of bleeding which appeared to be from the granulation tissue given his coagulopathy and anticoagulation.  Pressure dressings were unsuccessful .  He ended up in ICU with significant bleeding requiring intubation and a tourniquet.  He was resuscitated in the ICU and taken for emergency surgery to the operating room for exploration.  As there was necrosis he underwent redo right above-knee amputation guillotine with placement of wound VAC early this morning.  I am seeing the patient as he has MRSA in the blood culture. Past Medical History:  Diagnosis Date  . Acute left PCA stroke ([place]) 02/17/2015  . Anemia    vitamin b and vit d deficiencies  . 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 Atherosclerosis of native arteries of the extremities with gangrene ([place]) 05/08/2018  . Atrial fibrillation ([place])   . Blind    right eye  . Complication of anesthesia 2019   disoriented, trying to jump out of bed after bka  . Coronary artery calcification seen on CT scan    a. 08/2018 CT Chest: Mild cor Ca2+.  . Diabetes mellitus with complication ([place])   . Dilated aortic root ([place])    a. 04/2018 Echo: 4.1cm. Asc Ao 3.5cm; b. 04/2018 CT: Asc Ao 3.6cm. 4.1cm @ sinus of Valsalva.  . Dysrhythmia    a fib  . Gangrene ([place]) 09/06/2019  . Gangrene of right foot ([place]) 06/02/2018  . GERD (gastroesophageal reflux disease)   . History of echocardiogram    a. 04/2018 Echo: Ef 60-65%, no rwma, midly to mod dil Ao root - 4.1cm. Asc Ao 3.5cm. Mild MR. Nl RV fxn. Nl PASP.  Marland Kitchen History of hernia repair   . History of stress test    a. 2016 MV ([name]): EF 58%, no ischemia.  Marland Kitchen Hx of BKA, right ([place]) 06/16/2018  . Hyperlipidemia   . Hypertension   . Leg pain   . Legally blind    right eye homonymous hemianopsa  . Memory change    d/t strokes  . Necrotic toes ([place]) 02/21/2018  . PAF (paroxysmal atrial fibrillation) ([name])    a. on Eliquis as of 2018; b. CHADS2VASc => 5 (HTN, DM, stroke  x 2, vascular disease)  . Peripheral vascular disease ([place])    a. followed by Dr. Lucky Cowboy; b. s/p kissing balloon stents and right external iliac stent in 10/2017; c. 05/2018 s/p R BKA; c. 08/2019: Bilat Iliac PTA & DBA or REIA.  . Pulmonary nodules   . S/P bilateral BKA (below knee amputation) ([place]) 06/11/2018  . Stroke [facility])    a. 2016 & 2018  . Stroke [facility]) 07/11/2018    Past Surgical History:  Procedure Laterality Date  . ABDOMINAL AORTA STENT     x 2  . AMPUTATION Right 06/02/2018   Procedure: AMPUTATION BELOW KNEE;  Surgeon: [name], MD;  Location: [facility] ORS;  Service: Vascular;  Laterality: Right;  . AMPUTATION Right 10/26/2019   Procedure: RIGHT AMPUTATION ABOVE KNEE;   Surgeon: [name], MD;  Location: [facility] ORS;  Service: Vascular;  Laterality: Right;  . APPENDECTOMY    . COLONOSCOPY WITH PROPOFOL N/A 05/12/2018   Procedure: COLONOSCOPY WITH PROPOFOL;  Surgeon: [name], MD;  Location: [facility] ENDOSCOPY;  Service: Gastroenterology;  Laterality: N/A;  . ESOPHAGOGASTRODUODENOSCOPY (EGD) WITH PROPOFOL N/A 05/12/2018   Procedure: ESOPHAGOGASTRODUODENOSCOPY (EGD) WITH PROPOFOL;  Surgeon: [name], MD;  Location: [facility] ENDOSCOPY;  Service: Gastroenterology;  Laterality: N/A;  . GIVENS CAPSULE STUDY N/A 07/13/2018   Procedure: GIVENS CAPSULE STUDY;  Surgeon: [name], MD;  Location: [facility] ENDOSCOPY;  Service: Gastroenterology;  Laterality: N/A;  . HERNIA REPAIR     UMBILICAL  . INCISION AND DRAINAGE Right 11/11/2019   Procedure: INCISION AND DRAINAGE RIGHT ABOVE THE KNEE STUMP;  Surgeon: Evaristo Bury, MD;  Location: [facility] ORS;  Service: Vascular;  Laterality: Right;  . LEG AMPUTATION Right 10/26/2019   prior right BKA required AKA for non-healing ulcer and wound infection  . LOWER EXTREMITY ANGIOGRAPHY Right 10/25/2017   Procedure: LOWER EXTREMITY ANGIOGRAPHY;  Surgeon: [name], MD;  Location: [place] CV LAB;  Service: Cardiovascular;  Laterality: Right;  . LOWER EXTREMITY ANGIOGRAPHY Right 01/13/2018   Procedure: LOWER EXTREMITY ANGIOGRAPHY;  Surgeon: [name], MD;  Location: [place] CV LAB;  Service: Cardiovascular;  Laterality: Right;  . LOWER EXTREMITY ANGIOGRAPHY Right 08/28/2019   Procedure: LOWER EXTREMITY ANGIOGRAPHY;  Surgeon: [name], MD;  Location: [place] CV LAB;  Service: Cardiovascular;  Laterality: Right;  . LOWER EXTREMITY INTERVENTION  10/25/2017   Procedure: LOWER EXTREMITY INTERVENTION;  Surgeon: [name], MD;  Location: [place] CV LAB;  Service: Cardiovascular;;  . SKIN SPLIT GRAFT Right 12/28/2018   Procedure: SKIN GRAFT SPLIT THICKNESS ( SYNTHETIC );  Surgeon: [name], MD;  Location: [facility] ORS;   Service: Vascular;  Laterality: Right;  . TONSILLECTOMY    . WOUND DEBRIDEMENT Right 08/08/2018   Procedure: DEBRIDEMENT WOUND WITH WOUND VAC APPLICATION;  Surgeon: [name], MD;  Location: [facility] ORS;  Service: Vascular;  Laterality: Right;    Social History   Socioeconomic History  . Marital status: Married    Spouse name: debra  . Number of children: 0  . Years of education: Not on file  . Highest education level: Associate degree: academic program  Occupational History    Comment: disabled  Tobacco Use  . Smoking status: Former Smoker    Packs/day: 1.00    Years: 30.00    Pack years: 30.00    Types: Cigarettes    Quit date: 01/09/2018    Years since quitting: 1.9  . Smokeless tobacco: Never Used  Substance and Sexual  Activity  . Alcohol use: Yes    Alcohol/week: 0.0 standard drinks    Comment: occasionally beer   . Drug use: No  . Sexual activity: Yes  Other Topics Concern  . Not on file  Social History Narrative   Lives with wife in a ranch style house.  Uses a walker or a wheelchair to get around.   Social Determinants of Health   Financial Resource Strain:   . Difficulty of Paying Living Expenses:   Food Insecurity:   . Worried About Charity fundraiser in the Last Year:   . Arboriculturist in the Last Year:   Transportation Needs:   . Film/video editor (Medical):   Marland Kitchen Lack of Transportation (Non-Medical):   Physical Activity:   . Days of Exercise per Week:   . Minutes of Exercise per Session:   Stress:   . Feeling of Stress :   Social Connections:   . Frequency of Communication with Friends and Family:   . Frequency of Social Gatherings with Friends and Family:   . Attends Religious Services:   . Active Member of Clubs or Organizations:   . Attends Archivist Meetings:   Marland Kitchen Marital Status:   Intimate Partner Violence:   . Fear of Current or Ex-Partner:   . Emotionally Abused:   Marland Kitchen Physically Abused:   . Sexually Abused:     Family  History  Problem Relation Age of Onset  . Diabetes Father   . Hypertension Father   . Hyperlipidemia Father    Allergies  Allergen Reactions  . Sodium Pentobarbital [Pentobarbital] Shortness Of Breath  . Lipitor [Atorvastatin] Rash    ? Current Facility-Administered Medications  Medication Dose Route Frequency Provider Last Rate Last Admin  . 0.9 %  sodium chloride infusion (Manually program via Guardrails IV Fluids)   Intravenous Once Lang Snow, NP      . 0.9 %  sodium chloride infusion (Manually program via Guardrails IV Fluids)   Intravenous Once Lang Snow, NP      . 0.9 %  sodium chloride infusion (Manually program via Guardrails IV Fluids)   Intravenous Once Lang Snow, NP      . 0.9 %  sodium chloride infusion (Manually program via Guardrails IV Fluids)   Intravenous Once Darel Hong D, NP      . 0.9 %  sodium chloride infusion   Intravenous Continuous Athena Masse, MD 75 mL/hr at 01/14/2020 1727 75 mL/hr at 01/04/2020 1727  . 0.9 %  sodium chloride infusion  250 mL Intravenous Continuous Awilda Bill, NP      . acetaminophen (TYLENOL) tablet 650 mg  650 mg Oral Q6H PRN Athena Masse, MD       Or  . acetaminophen (TYLENOL) suppository 650 mg  650 mg Rectal Q6H PRN Athena Masse, MD      . amiodarone (NEXTERONE) 1.8 mg/mL load via infusion 150 mg  150 mg Intravenous Once Awilda Bill, NP       Followed by  . amiodarone (NEXTERONE PREMIX) 360-4.14 MG/200ML-% (1.8 mg/mL) IV infusion  30 mg/hr Intravenous Continuous Awilda Bill, NP 33.3 mL/hr at 01/08/2020 1651 59.9999 mg/hr at 01/17/2020 1651  . Chlorhexidine Gluconate Cloth 2 % PADS 6 each  6 each Topical Daily Flora Lipps, MD   6 each at 01/28/2020 0949  . dexmedetomidine (PRECEDEX) 400 MCG/100ML (4 mcg/mL) infusion  0.4-1.2 mcg/kg/hr Intravenous Titrated Awilda Bill, NP  6.8 mL/hr at 01/25/2020 1748 0.4 mcg/kg/hr at 01/26/2020 1748  . docusate (COLACE) 50 MG/5ML liquid 100 mg   100 mg Oral BID Darel Hong D, NP      . fentaNYL (SUBLIMAZE) bolus via infusion 25 mcg  25 mcg Intravenous Q15 min PRN Bradly Bienenstock, NP      . fentaNYL (SUBLIMAZE) injection 25 mcg  25 mcg Intravenous Once Darel Hong D, NP      . fentaNYL 2567mcg in NS 248mL (17mcg/ml) infusion-PREMIX  25-200 mcg/hr Intravenous Continuous Bradly Bienenstock, NP 10 mL/hr at 01/20/2020 1749 100 mcg/hr at 01/08/2020 1749  . HYDROcodone-acetaminophen (NORCO/VICODIN) 5-325 MG per tablet 1-2 tablet  1-2 tablet Oral Q4H PRN Athena Masse, MD   2 tablet at 12/04/2019 1323  . hydrocortisone sodium succinate (SOLU-CORTEF) 100 MG injection 50 mg  50 mg Intravenous Q6H Darel Hong D, NP   50 mg at 01/09/2020 1745  . HYDROmorphone (DILAUDID) injection 1 mg  1 mg Intravenous Q4H PRN Lang Snow, NP   1 mg at 12/06/2019 2338  . HYDROmorphone (DILAUDID) injection 1 mg  1 mg Intravenous STAT Awilda Bill, NP      . insulin aspart (novoLOG) injection 0-5 Units  0-5 Units Subcutaneous QHS Judd Gaudier V, MD      . insulin aspart (novoLOG) injection 0-9 Units  0-9 Units Subcutaneous TID WC Athena Masse, MD   2 Units at 01/31/2020 1745  . ketorolac (TORADOL) 30 MG/ML injection 15 mg  15 mg Intravenous Q6H PRN Athena Masse, MD   15 mg at 12/03/2019 1658  . mupirocin ointment (BACTROBAN) 2 % 1 application  1 application Nasal BID Ottie Glazier, MD      . norepinephrine (LEVOPHED) 16 mg in 253mL premix infusion  0-40 mcg/min Intravenous Titrated Darel Hong D, NP 7.5 mL/hr at 01/30/2020 1750 8 mcg/min at 01/15/2020 1750  . ondansetron (ZOFRAN) tablet 4 mg  4 mg Oral Q6H PRN Athena Masse, MD       Or  . ondansetron [facility]) injection 4 mg  4 mg Intravenous Q6H PRN Athena Masse, MD      . pantoprazole (PROTONIX) injection 40 mg  40 mg Intravenous Daily Darel Hong D, NP   40 mg at 01/04/2020 0954  . phenylephrine CONCENTRATED 100mg  in sodium chloride 0.9% 224mL (0.4mg /mL) infusion  0-400 mcg/min  Intravenous Titrated Bradly Bienenstock, NP   Stopped at 01/29/2020 1751  . polyethylene glycol (MIRALAX / GLYCOLAX) packet 17 g  17 g Oral Daily Darel Hong D, NP      . propofol (DIPRIVAN) 1000 MG/100ML infusion  0-50 mcg/kg/min Intravenous Continuous Bradly Bienenstock, NP 8.16 mL/hr at 01/30/2020 1751 20 mcg/kg/min at 01/31/2020 1751  . prothrombin complex conc human (KCENTRA) IVPB 1,500 Units  1,500 Units Intravenous STAT [place], Bing [place], NP      . vancomycin (VANCOCIN) IVPB 1000 mg/200 mL premix  1,000 mg Intravenous Q12H Dallie Piles, RPH      . vasopressin (PITRESSIN) 40 Units in sodium chloride 0.9 % 250 mL (0.16 Units/mL) infusion  0.03 Units/min Intravenous Continuous Awilda Bill, NP 11.25 mL/hr at 01/30/2020 0156 0.03 Units/min at 01/19/2020 0156     Abtx:  Anti-infectives (From admission, onward)   Start     Dose/Rate Route Frequency Ordered Stop   01/25/2020 2000  vancomycin (VANCOCIN) IVPB 1000 mg/200 mL premix     1,000 mg 200 mL/hr over 60 Minutes Intravenous Every 12 hours 01/14/2020  1220     01/21/2020 0800  vancomycin (VANCOREADY) IVPB 750 mg/150 mL  Status:  Discontinued     750 mg 150 mL/hr over 60 Minutes Intravenous Every 8 hours 12/08/2019 2051 01/13/2020 1220   12/29/2019 2200  vancomycin (VANCOREADY) IVPB 1500 mg/300 mL     1,500 mg 150 mL/hr over 120 Minutes Intravenous  Once 12/25/2019 2050 01/24/2020 0144   12/17/2019 1400  metroNIDAZOLE (FLAGYL) IVPB 500 mg  Status:  Discontinued     500 mg 100 mL/hr over 60 Minutes Intravenous Every 8 hours 12/07/2019 0543 12/14/2019 1959   12/25/2019 1400  ceFEPIme (MAXIPIME) 2 g in sodium chloride 0.9 % 100 mL IVPB  Status:  Discontinued     2 g 200 mL/hr over 30 Minutes Intravenous Every 8 hours 12/06/2019 0554 12/03/2019 1959   12/14/2019 0515  ceFEPIme (MAXIPIME) 2 g in sodium chloride 0.9 % 100 mL IVPB     2 g 200 mL/hr over 30 Minutes Intravenous  Once 12/05/2019 0511 12/18/2019 0618   12/16/2019 0515  metroNIDAZOLE (FLAGYL) IVPB 500 mg      500 mg 100 mL/hr over 60 Minutes Intravenous  Once 12/10/2019 0511 12/08/2019 1128   12/24/2019 0515  vancomycin (VANCOCIN) IVPB 1000 mg/200 mL premix     1,000 mg 200 mL/hr over 60 Minutes Intravenous  Once 12/26/2019 0511 12/18/2019 1128      REVIEW OF SYSTEMS:  NA Objective:  VITALS:  BP 120/72   Pulse 80   Temp 98.1 F (36.7 C)   Resp 18   Ht 5\' 9"  (1.753 m)   Wt 82.1 kg   SpO2 96%   BMI 26.73 kg/m  PHYSICAL EXAM:  General:Intubated, sedated,pressor Pale Head: Normocephalic, without obvious abnormality, atraumatic. Eyes: Conjunctivae clear, anicteric sclerae. Pupils are equal ENT did not examine' Neck: Rt IJ  Back:did not examine Lungs:b/l air entry Heart: Tachycardia Abdomen: Soft, non-tender,not distended. Bowel sounds normal. No masses Extremities: rt AKA surgical site covered with dressing Foley catheter Skin: No rashes or lesions. Or bruising Lymph: Cervical, supraclavicular normal. Neurologic: cannot be examined Lab Results CBC    Component Value Date/Time   WBC 23.3 (H) 01/21/2020 0424   RBC 2.48 (L) 01/19/2020 0424   HGB 10.4 (L) 01/14/2020 1426   HGB 11.2 (L) 10/23/2019 1403   HCT 29.3 (L) 01/22/2020 1426   HCT 35.0 (L) 10/23/2019 1403   PLT 125 (L) 01/19/2020 0424   PLT 199 10/23/2019 1403   MCV 87.5 01/18/2020 0424   MCV 84 10/23/2019 1403   MCH 27.8 01/11/2020 0424   MCHC 31.8 01/19/2020 0424   RDW 15.9 (H) 01/20/2020 0424   RDW 14.3 10/23/2019 1403   LYMPHSABS 1.4 01/20/2020 0424   LYMPHSABS 0.9 10/23/2019 1403   MONOABS 2.0 (H) 01/26/2020 0424   EOSABS 0.0 01/07/2020 0424   EOSABS 0.3 10/23/2019 1403   BASOSABS 0.1 01/14/2020 0424   BASOSABS 0.0 10/23/2019 1403    CMP Latest Ref Rng & Units 01/30/2020 12/19/2019 11/28/2019  Glucose 70 - 99 mg/dL 264(H) 158(H) 134(H)  BUN 8 - 23 mg/dL 18 18 13   Creatinine 0.61 - 1.24 mg/dL 0.88 0.69 0.75  Sodium 135 - 145 mmol/L 134(L) 132(L) 135  Potassium 3.5 - 5.1 mmol/L 3.9 3.8 4.6  Chloride 98 - 111  mmol/L 108 99 103  CO2 22 - 32 mmol/L 16(L) 23 26  Calcium 8.9 - 10.3 mg/dL 6.5(L) 8.9 9.1  Total Protein 6.5 - 8.1 g/dL 3.7(L) 7.3 -  Total Bilirubin 0.3 -  1.2 mg/dL 1.5(H) 1.1 -  Alkaline Phos 38 - 126 U/L 48 105 -  AST 15 - 41 U/L 278(H) 18 -  ALT 0 - 44 U/L 132(H) 14 -      Microbiology: Recent Results (from the past 240 hour(s))  Culture, blood (routine x 2)     Status: None (Preliminary result)   Collection Time: 12/12/2019  3:58 AM   Specimen: Left Antecubital; Blood  Result Value Ref Range Status   Specimen Description LEFT ANTECUBITAL  Final   Special Requests   Final    BOTTLES DRAWN AEROBIC AND ANAEROBIC Blood Culture results may not be optimal due to an excessive volume of blood received in culture bottles   Culture  Setup Time   Final    GRAM POSITIVE COCCI IN BOTH AEROBIC AND ANAEROBIC BOTTLES CRITICAL VALUE NOTED.  VALUE IS CONSISTENT WITH PREVIOUSLY REPORTED AND CALLED VALUE. Performed at [facility], [address] [place], [place] [address]    Culture [facility] POSITIVE COCCI  Final   Report Status PENDING  Incomplete  Culture, blood (routine x 2)     Status: None (Preliminary result)   Collection Time: 12/25/2019  3:58 AM   Specimen: BLOOD LEFT HAND  Result Value Ref Range Status   Specimen Description BLOOD LEFT HAND  Final   Special Requests   Final    BOTTLES DRAWN AEROBIC AND ANAEROBIC Blood Culture results may not be optimal due to an excessive volume of blood received in culture bottles   Culture  Setup Time   Final    Organism ID to follow [place] AND ANAEROBIC BOTTLES CRITICAL RESULT CALLED TO, READ BACK BY AND VERIFIED WITH: [employer] @1907  12/11/2019 AKT Performed at [facility], [address], [place], [place] [address]    Culture GRAM POSITIVE COCCI  Final   Report Status PENDING  Incomplete  Urine culture     Status: None   Collection Time: 12/11/2019  3:58 AM   Specimen: Urine, Clean Catch  Result  Value Ref Range Status   Specimen Description   Final    URINE, CLEAN CATCH Performed at [facility], [address] [place], [place] [address]    Special Requests   Final    NONE Performed at [facility], [address] [place], [place] [address]    Culture   Final    NO GROWTH Performed at [facility], [place] [address], [place], [place] [address]    Report Status 01/20/2020 FINAL  Final  SARS Coronavirus 2 by RT PCR (hospital order, performed in [facility] hospital lab) Nasopharyngeal Nasopharyngeal Swab     Status: None   Collection Time: 12/04/2019  3:58 AM   Specimen: Nasopharyngeal Swab  Result Value Ref Range Status   SARS Coronavirus 2 NEGATIVE NEGATIVE Final    Comment: (NOTE) SARS-CoV-2 target nucleic acids are NOT DETECTED. The SARS-CoV-2 RNA is generally detectable in upper and lower respiratory specimens during the acute phase of infection. The lowest concentration of SARS-CoV-2 viral copies this assay can detect is 250 copies / mL. A negative result does not preclude SARS-CoV-2 infection and should not be used as the sole basis for treatment or other patient management decisions.  A negative result may occur with improper specimen collection / handling, submission of specimen other than nasopharyngeal swab, presence of viral mutation(s) within the areas targeted by this assay, and inadequate number of viral copies (<250 copies /  mL). A negative result must be combined with clinical observations, patient history, and epidemiological information. Fact Sheet for Patients:   StrictlyIdeas.no Fact Sheet for Healthcare Providers: BankingDealers.co.za This test is not yet approved or cleared  by the [place] FDA and has been authorized for detection and/or diagnosis of SARS-CoV-2 by FDA under an Emergency Use Authorization (EUA).  This EUA will remain in effect (meaning this test can be used)  for the duration of the COVID-19 declaration under Section 564(b)(1) of the Act, 21 U.S.C. section 360bbb-3(b)(1), unless the authorization is terminated or revoked sooner. Performed at [facility], [address] [place], [place] [address]   Blood Culture ID Panel (Reflexed)     Status: Abnormal   Collection Time: 12/22/2019  3:58 AM  Result Value Ref Range Status   Enterococcus species NOT DETECTED NOT DETECTED Final   Listeria monocytogenes NOT DETECTED NOT DETECTED Final   Staphylococcus species DETECTED (A) NOT DETECTED Final    Comment: CRITICAL RESULT CALLED TO, READ BACK BY AND VERIFIED WITH: CHRIS MORAN @1907  12/03/2019 AKT    Staphylococcus aureus (BCID) DETECTED (A) NOT DETECTED Final    Comment: Methicillin (oxacillin)-resistant Staphylococcus aureus (MRSA). MRSA is predictably resistant to beta-lactam antibiotics (except ceftaroline). Preferred therapy is vancomycin unless clinically contraindicated. Patient requires contact precautions if  hospitalized. CRITICAL RESULT CALLED TO, READ BACK BY AND VERIFIED WITH: CHRIS MORAN @1907  12/23/2019 AKT    Methicillin resistance DETECTED (A) NOT DETECTED Final    Comment: CRITICAL RESULT CALLED TO, READ BACK BY AND VERIFIED WITH: CHRIS MORAN @1907  12/26/2019 AKT    Streptococcus species NOT DETECTED NOT DETECTED Final   Streptococcus agalactiae NOT DETECTED NOT DETECTED Final   Streptococcus pneumoniae NOT DETECTED NOT DETECTED Final   Streptococcus pyogenes NOT DETECTED NOT DETECTED Final   Acinetobacter baumannii NOT DETECTED NOT DETECTED Final   Enterobacteriaceae species NOT DETECTED NOT DETECTED Final   Enterobacter cloacae complex NOT DETECTED NOT DETECTED Final   Escherichia coli NOT DETECTED NOT DETECTED Final   Klebsiella oxytoca NOT DETECTED NOT DETECTED Final   Klebsiella pneumoniae NOT DETECTED NOT DETECTED Final   Proteus species NOT DETECTED NOT DETECTED Final   Serratia marcescens NOT DETECTED NOT DETECTED  Final   Haemophilus influenzae NOT DETECTED NOT DETECTED Final   Neisseria meningitidis NOT DETECTED NOT DETECTED Final   Pseudomonas aeruginosa NOT DETECTED NOT DETECTED Final   Candida albicans NOT DETECTED NOT DETECTED Final   Candida glabrata NOT DETECTED NOT DETECTED Final   Candida krusei NOT DETECTED NOT DETECTED Final   Candida parapsilosis NOT DETECTED NOT DETECTED Final   Candida tropicalis NOT DETECTED NOT DETECTED Final    Comment: Performed at [facility], [address] [place], [place] [address]  Surgical PCR screen     Status: Abnormal   Collection Time: 01/31/2020 12:25 PM   Specimen: Nasal Mucosa; Nasal Swab  Result Value Ref Range Status   MRSA, PCR POSITIVE (A) NEGATIVE Final    Comment: RESULT CALLED TO, READ BACK BY AND VERIFIED WITH:  MYRA FLOWERS AT [id number] 01/25/2020 SDR    Staphylococcus aureus POSITIVE (A) NEGATIVE Final    Comment: (NOTE) The Xpert SA Assay (FDA approved for NASAL specimens in patients 10 years of age and older), is one component of a comprehensive surveillance program. It is not intended to diagnose infection nor to guide or monitor treatment. Performed at [facility], [address], [place],  [address]     IMAGING RESULTS:  I  have personally reviewed the films ? Impression/Recommendation ? ?MRSA bacteremia secondary to infected AKA stump Repeat blood cultures to look for clearance of the bacteremia  will need TEE as he has had prior MRSA infection of the stump and we do not know how long he has had the bacteremia  Necrotic and infected right AKA stump status post amputation by guillotine and has a wound VAC.  Hemorrhagic shock secondary to bleeding from the AKA stump.  Also his liver disease contributing to the coagulopathy.  Received blood transfusion, FFP   Cirrhosis of the liver  Transaminitis could be from shock with underlying cirrhosis  Hepatitis B  infection  ? ___________________________________________________ Discussed with care team Note:  This document was prepared using Dragon voice recognition software and may include unintentional dictation errors.

## 2020-01-02 NOTE — Progress Notes (Signed)
Good day. All drips weaned to minimal amounts. Foley removed and reinserted for urine flow after bladder scan reviewed urine of 600 ml + in bladder. More alert this pm. MRSA in nares and blood. Afebrile. OG to LWS. Remains intubated and in Atrial Fib. on the monitor with PVCs. Opens eyes to voice. FiO2 weaned to 35%.

## 2020-01-02 NOTE — Progress Notes (Signed)
Initial Nutrition Assessment  DOCUMENTATION CODES:   Not applicable  INTERVENTION:  If patient remains intubated >24-48 hours recommend initiation of tube feeds once stable enough.  If plan is for tube feeds recommend initiating Vital AF 1.2 Cal at 20 mL/hr and advancing by 15 mL/hr every 8 hours to goal rate of 50 mL/hr (1200 mL goal daily volume). Also provide Pro-Stat 30 mL BID per tube. Goal regimen provides 1640 kcal, 120 grams of protein, 1008 mL H2O daily. With current propofol rate provides 1963 kcal daily.  NUTRITION DIAGNOSIS:   Inadequate oral intake related to inability to eat as evidenced by NPO status.  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   Patient is currently intubated on ventilator support MV: 10.2 L/min Temp (24hrs), Avg:98.5 F (36.9 C), Min:97.7 F (36.5 C), Max:99.9 F (37.7 C)  Propofol: 12.24 ml/hr (323)  Medications reviewed and include: Colace 100 mg BID, Solu-Cortef 50 mg Q6hrs IV, Novolog 0-5 units QHS, Novolog 0-9 units TID, Protonix, Miralax 17 grams daily, amiodarone, Precedex gtt, fentanyl gtt, norepinephrine gtt at 10 mcg/min, phenylephrine gtt at 40 mcg/min, propofol gtt, vancomycin, vasopressin gtt at 0.03 units/min.  Labs reviewed: CBG 210, Sodium 134, CO2 16.  Enteral Access: OGT placed today;  terminates in proximal stomach per abdominal x-ray  Patient does not meet criteria for malnutrition at this time.  Discussed with RN and on rounds. Plan is to discuss goals of care.  NUTRITION - FOCUSED PHYSICAL EXAM:    Most Recent Value  Orbital Region  No depletion  Upper Arm Region  Mild depletion  Thoracic and Lumbar Region  No depletion  Buccal Region  Unable to assess  Temple Region  Moderate depletion  Clavicle Bone Region  No depletion  Clavicle and Acromion Bone Region  No depletion  Scapular Bone Region  Unable to assess  Dorsal Hand  Mild depletion  Patellar Region  No depletion [assessed left leg as patient s/p right AKA]  Anterior Thigh Region  No depletion [assessed left leg as patient s/p right AKA]  Posterior Calf Region  No depletion [assessed left leg as patient s/p right AKA]  Edema (RD Assessment)  Mild  Hair  Reviewed  Eyes  Unable to assess  Mouth  Unable to assess  Skin  Reviewed  Nails  Reviewed     Diet Order:   Diet Order            Diet NPO time specified  Diet effective now             EDUCATION NEEDS:   No education needs have been identified at this time  Skin:  Skin Assessment: Skin Integrity Issues:(s/p right AKA)  Last BM:  01/13/2020 - medium type 7  Height:   Ht Readings from Last 1 Encounters:  12/04/2019  5\' 9"  (1.753 m)   Weight:   Wt Readings from Last 1 Encounters:  01/15/2020 82.1 kg   BMI:  Body mass index is 26.73 kg/m.  Estimated Nutritional Needs:   Kcal:  1937 (PSU 2003b w/ MSJ 1601, Ve 10.2, Tmax 37.7)  Protein:  105-125 grams  Fluid:  2 L/day  Jacklynn Barnacle, MS, RD, LDN Pager number available on Amion

## 2020-01-02 NOTE — Progress Notes (Signed)
Rapid Response Event Note  Overview:pt lying in bed yelling c/o pain to rt. Leg amputation, pt has bleeding noted to site that had been reinforced by an ace wrap. Family, nurse, house supervisor at bedside , NP      Initial Focused Assessment:   Interventions:pt was given pain med's. LABS  DRAWN STAT. DRESSING REINFORCED , VASCULAR SURGEON NOTIFIED VS WNL 98.2 Temp. 105/80 BP 134 HR   Plan of Care (if not transferred): TRANSFER TO STEPDOWN  Event Summary:   at  2000-2030     at          [facility]

## 2020-01-02 NOTE — Consult Note (Signed)
Name: [name]. MRN: [id number] DOB: 1956-01-06    ADMISSION DATE:  12/17/2019 CONSULTATION DATE:  01/31/2020  REFERRING MD :  Dr. Trula Slade  CHIEF COMPLAINT:  Fever, pain of Right AKA site  BRIEF PATIENT DESCRIPTION:  64 y.o. Male with PMH of liver cirrhosis, ascites, Hepatitis B, and Chronic A-fib on Eliquis,  admitted 12/24/2019 with Sepsis and MRSA Bacteremia in setting of surgical site infection of Right AKA (Right BKA performed in March, with sepsis of BKA in April 2021 requiring conversion to AKA and further I&D).  His wound VAC was removed on 12/29/2019.  On 01/29/2020 pt with hemorrhagic shock due to hemorrhage from AKA site requiring intubation and emergently taken to OR for redo of right AKA.  SIGNIFICANT EVENTS  5/31: Admission to Med/Surg unit 5/31: Removal of wound VAC by vascular surgery 5/31: Oozing from site despite pressure dressing and reinforcement of site, 2 point drop in hemoglobin; transfer to Stepdown 6/1: Hemorrhaging from site with hemorrhagic shock, required emergent intubation 6/1: Take to OR emergently for redo of right AKA  STUDIES:  5/31: CXR>>There is mild cardiomegaly. There is slight prominence of the central pulmonary vasculature. No large airspace consolidation or pleural effusion. No acute osseous abnormality. 6/1: CXR>>There is mild cardiomegaly. Aortic knob calcifications. ETT is 3.7 cm above the carina. A right-sided central venous catheter seen with the tip in the mid SVC. OG tube is seen within the proximal stomach. There is mild prominence of the central pulmonary vasculature. No pneumothorax is seen. 6/1: KUB>>Mildly dilated small bowel loops which may be due to partial small bowel obstruction versus ileus. OG tube within the proximal stomach.  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  His wife reported that since his debridement has had constant pain, which acutely worsened of late.  He denied chest pain, SOB, cough, abdominal pain, or change in bowel habits.  Upon presentation to the ED he was noted to be febrile with temperature 101.8, blood pressure 119/78, heart rate 124, respirations 20, with O2 saturations 90% on room air.  Work-up was significant for WBC 12.8, lactic acid 1.2, hemoglobin 9.3, high-sensitivity troponin 51.  Urinalysis without infection.  His SARS-CoV-2 PCR is negative.  He was admitted to the MedSurg unit by the Hospitalist for further work-up and treatment of sepsis secondary to surgical wound infection of his right AKA.  Vascular surgery was consulted.  Blood cultures came back positive for MRSA, of which infectious disease was subsequently consulted.  On 5/31 his wound VAC was removed by vascular surgery.  Post VAC removal he was noted to have continued oozing from his wound despite efforts to reinforce the dressing and with the placement of a pressure dressing.  Rapid response was called due to continued bleeding and tachycardia.  He was found to have a two-point drop in his hemoglobin along with INR of 2.8.  He was subsequently transferred to stepdown, and is to receive blood and FFP to reverse his coagulopathy.  Early in the morning on 01/23/2020 he developed profuse bleeding from his  AKA site with hemorrhagic shock and hemodynamic instability requiring emergent intubation.  Vascular surgery is at bedside with plans to take patient emergently to the OR for redo right AKA.  PCCM is consulted for further management of hemorrhagic  shock.  PAST MEDICAL HISTORY :   has a past medical history of Acute left PCA stroke ([place]) (02/17/2015), Anemia, Arteriovenous malformation of gastrointestinal tract, Atherosclerosis of native arteries of the extremities with gangrene ([place]) (05/08/2018), Atrial fibrillation ([place]), Blind, Complication of anesthesia (2019), Coronary artery calcification seen on CT scan, Diabetes mellitus with complication ([place]), Dilated aortic root ([place]), Dysrhythmia, Gangrene ([place]) (09/06/2019), Gangrene of right foot ([place]) (06/02/2018), GERD (gastroesophageal reflux disease), History of echocardiogram, History of hernia repair, History of stress test, BKA, right ([place]) (06/16/2018), Hyperlipidemia, Hypertension, Leg pain, Legally blind, Memory change, Necrotic toes ([place]) (02/21/2018), PAF (paroxysmal atrial fibrillation) ([place]), Peripheral vascular disease ([place]), Pulmonary nodules, S/P bilateral BKA (below knee amputation) ([place]) (06/11/2018), Stroke ([place]), and Stroke ([place]) (07/11/2018).  has a past surgical history that includes Appendectomy; Lower Extremity Angiography (Right, 10/25/2017); LOWER EXTREMITY INTERVENTION (10/25/2017); Tonsillectomy; Lower Extremity Angiography (Right, 01/13/2018); Colonoscopy with propofol (N/A, 05/12/2018); Esophagogastroduodenoscopy (egd) with propofol (N/A, 05/12/2018); Amputation (Right, 06/02/2018); Givens capsule study (N/A, 07/13/2018); Wound debridement (Right, 08/08/2018); Skin split graft (Right, 12/28/2018); Lower Extremity Angiography (Right, 08/28/2019); Hernia repair; Abdominal aorta stent; Amputation (Right, 10/26/2019); Leg amputation (Right, 10/26/2019); and Incision and drainage (Right, 11/11/2019). Prior to Admission medications   Medication Sig Start Date End Date Taking? Authorizing Provider  acetaminophen (TYLENOL) 325 MG tablet Take 2 tablets (650 mg total) by mouth every 6 (six) hours as needed for mild pain (or Fever >/= 101). 11/29/19  Yes Wieting, Richard, MD  acidophilus (RISAQUAD)  CAPS capsule Take 1 capsule by mouth daily. 12/19/19  Yes Johnson, Megan P, DO  apixaban (ELIQUIS) 5 MG TABS tablet Take 1 tablet (5 mg total) by mouth 2 (two) times daily. 10/19/19  Yes Johnson, Megan P, DO  cholecalciferol (VITAMIN D3) 25 MCG (1000 UT) tablet Take 5,000 Units by mouth daily.    Yes [provider]  docusate sodium (COLACE) 100 MG capsule Take 100 mg by mouth daily. 12/12/19  Yes [provider]  ferrous sulfate (FERROUSUL) 325 (65 FE) MG tablet Take 1 tablet (325 mg total) by mouth 2 (two) times daily with a meal. 03/14/19  Yes Earlie Server, MD  furosemide (LASIX) 20 MG tablet Take 1 tablet (20 mg total) by mouth daily. 12/19/19  Yes Johnson, Megan P, DO  gabapentin (NEURONTIN) 600 MG tablet Take 2 tablets (1,200 mg total) by mouth 3 (three) times daily. 12/19/19  Yes Johnson, Megan P, DO  hydrochlorothiazide (MICROZIDE) 12.5 MG capsule Take 12.5 mg by mouth daily. 12/31/19  Yes [provider]  metFORMIN (GLUCOPHAGE) 500 MG tablet Take 1 tablet (500 mg total) by mouth 2 (two) times daily with a meal. 12/19/19  Yes Johnson, Megan P, DO  metoprolol tartrate (LOPRESSOR) 50 MG tablet Take 1 tablet (50 mg total) by mouth 2 (two) times daily. 12/19/19  Yes Johnson, Megan P, DO  Multiple Vitamin (MULTIVITAMIN WITH MINERALS) TABS tablet Take 1 tablet by mouth daily. 06/24/17  Yes Fritzi Mandes, MD  oxyCODONE (OXY IR/ROXICODONE) 5 MG immediate release tablet Take 1-2 tablets (5-10 mg total) by mouth every 6 (six) hours as needed for up to 8 days for moderate pain, severe pain or breakthrough pain. 12/29/19 01/06/20 Yes Kris Hartmann, NP  pantoprazole (PROTONIX) 40 MG tablet Take 1 tablet (40 mg total)  by mouth daily. 12/19/19  Yes Johnson, Megan P, DO  spironolactone (ALDACTONE) 25 MG tablet Take 1 tablet (25 mg total) by mouth daily. 12/19/19  Yes Johnson, Megan P, DO  traZODone (DESYREL) 50 MG tablet Take 0.5 tablets (25 mg total) by mouth at bedtime as needed for sleep. 12/19/19   Yes Johnson, Megan P, DO  vitamin C (ASCORBIC ACID) 500 MG tablet Take 500 mg by mouth daily.   Yes [provider]  Ensure Max Protein (ENSURE MAX PROTEIN) LIQD Take 330 mLs (11 oz total) by mouth 2 (two) times daily between meals. 11/29/19   Loletha Grayer, MD   Allergies  Allergen Reactions  . Sodium Pentobarbital [Pentobarbital] Shortness Of Breath  . Lipitor [Atorvastatin] Rash    FAMILY HISTORY:  family history includes Diabetes in his father; Hyperlipidemia in his father; Hypertension in his father. SOCIAL HISTORY:  reports that he quit smoking about 1 years ago. His smoking use included cigarettes. He has a 30.00 pack-year smoking history. He has never used smokeless tobacco. He reports current alcohol use. He reports that he does not use drugs.   COVID-19 DISASTER DECLARATION:  FULL CONTACT PHYSICAL EXAMINATION WAS NOT POSSIBLE DUE TO TREATMENT OF COVID-19 AND  CONSERVATION OF PERSONAL PROTECTIVE EQUIPMENT, LIMITED EXAM FINDINGS INCLUDE-  Patient assessed or the symptoms described in the history of present illness.  In the context of the Global COVID-19 pandemic, which necessitated consideration that the patient might be at risk for infection with the SARS-CoV-2 virus that causes COVID-19, Institutional protocols and algorithms that pertain to the evaluation of patients at risk for COVID-19 are in a state of rapid change based on information released by regulatory bodies including the CDC and federal and state organizations. These policies and algorithms were followed during the patient's care while in hospital.  REVIEW OF SYSTEMS:   Unable to assess due to critical illness and intubation  SUBJECTIVE:  Unable to assess due to critical illness and intubation  VITAL SIGNS: Temp:  [98.2 F (36.8 C)-101.8 F (38.8 C)] 99.9 F (37.7 C) (05/31 2247) Pulse Rate:  [84-175] 169 (05/31 2247) Resp:  [16-22] 19 (05/31 2049) BP: (92-168)/(64-133) 105/73 (05/31  2247) SpO2:  [87 %-100 %] 91 % (05/31 2247) Weight:  [68 kg] 68 kg (05/31 0328)  PHYSICAL EXAMINATION: General:  Critically ill appearing male, laying in bed, intubated and sedated, in NAD Neuro:  Sedated, withdraws from pain, Pupils PERRLA HEENT:  Atraumatic, normocephalic, neck supple, no JVD Cardiovascular:  Irregularly irregular rhythm, rate controlled Lungs:  Clear to auscultation bilaterally, even, vent assisted Abdomen:  Soft, nontender, nondistended, no guarding or rebound tenderness, BS+ x4 Musculoskeletal:  Right AKA w/ Wound VAC clean, dry, and intact; no edema Skin:  Warm and dry.  No obvious rashes, lesions, or ulcerations  Recent Labs  Lab 12/14/2019 0358  NA 132*  K 3.8  CL 99  CO2 23  BUN 18  CREATININE 0.69  GLUCOSE 158*   Recent Labs  Lab 12/14/2019 0358 12/28/2019 1712 12/16/2019 2037  HGB 9.3* 8.3* 6.4*  HCT 29.1* 26.9* 21.1*  WBC 12.8* 13.8* 12.7*  PLT 229 223 202   DG Chest Port 1 View  Result Date: 12/26/2019 CLINICAL DATA:  Fever EXAM: PORTABLE CHEST 1 VIEW COMPARISON:  November 11, 2019 FINDINGS: There is mild cardiomegaly. There is slight prominence of the central pulmonary vasculature. No large airspace consolidation or pleural effusion. No acute osseous abnormality. IMPRESSION: Mild cardiomegaly and pulmonary vascular congestion. Electronically Signed   By:  Prudencio Pair M.D.   On: 12/17/2019 04:14    ASSESSMENT / PLAN:  Hemorrhagic shock +/- Septic shock Atrial fibrillation with RVR Hx: Chronic A-fib on Eliquis -Continuous cardiac monitoring -Maintain MAP greater than 65 -IV fluids -Vasopressors as needed to maintain map goal -Transfusions as indicated -Amiodarone drip -No anticoagulation due to hemorrhage  Acute Blood loss anemia in setting of hemorrhage from Right AKA Supratherapeutic INR in setting of liver cirrhosis & Eliquis -Monitor for S/Sx of bleeding -Trend CBC -SCD's for VTE Prophylaxis (no chemical prophylaxis due to  hemorrhage) -Transfuse for Hgb <8 -To receive FFP,  Kcentra, & Vitamin K (goal INR <2, Plt >50, Fibrinogen >100) -Hold Eliquis -Vascular Surgery to take emergently to OR for redo of AKA  Acute Hypoxic Respiratory Failure due to severe Shock & Hemodynamic Instabiity -Full vent support -Wean FiO2 and PEEP as tolerated to maintain O2 saturations greater than 92% -Follow intermittent chest x-ray and ABG as needed -VAP protocol -Spontaneous breathing trials when respiratory parameters met  Sepsis & MRSA Bacteremia due to infected Right AKA -Monitor fever curve -Trend WBCs and procalcitonin -Follow cultures as above -Continue vancomycin -ID consulted, appreciate input -Vascular Surgery following, appreciate input  Diabetes Mellitus Type 2 -CBG's -SSI -Follow ICU Hypo/hyperglycemia protocol           BEST PRACTICES DISPOSITION: ICU GOALS OF CARE: Full code VTE PROPHYLAXIS: SCD's STRESS ULCER PROPHYLAXIS: IV Protonix CONSULTS: Hospitalist (primary service), Vascular Surgery, Infectious Disease UPDATES: Pt's wife updated by Dr. Trula Slade with Vascular Surgery and [name], NP  Darel Hong, AGACNP-BC [place] Pager: [phone]  01/21/2020, 1:02 AM

## 2020-01-02 NOTE — Progress Notes (Signed)
Updated pts wife regarding significant change in pts condition and all questions were answered. Will continue to monitor and assess pt.  [name], [place] Pager [phone] (please enter 7 digits) PCCM Consult Pager [phone] (please enter 7 digits)

## 2020-01-02 NOTE — Op Note (Signed)
    Patient name: [name]. MRN: [id number] DOB: 1956-07-25 Sex: male  01/08/2020 Pre-operative Diagnosis: Bleeding from right above-knee amputation Post-operative diagnosis:  Same Surgeon:  Annamarie Major Assistants: None Procedure: #1: Redo right above-knee amputation (guillotine)   #2: Placement of wound VAC Anesthesia: General Blood Loss: 100 cc Specimens: Right leg  Findings: Dehiscence of the posterior flap of the right above-knee amputation with muscle necrosis because of the extent of the tunneling on the posterior flap of the amputation, I did not feel that local debridement would be satisfactory and so I elected to do a more proximal right above-knee amputation.  Given his hemodynamic instability as well as cellulitis and nonviable muscle, I elected to perform a guillotine amputation so that primary closure may be an option in a few days.  Indications: This is a 64 year old gentleman who has previously undergone a right above-knee amputation which did require debridement about a month ago for infection.  He presented to the emergency department today with pain in his stump as well as fevers.  I examined him in the emergency department and took his wound VAC off.  He had a healthy appearing layer of granulation tissue on his stump.  Throughout the day he had several episodes of bleeding which appeared to be from the granulation tissue given his coagulopathy and anticoagulation.  Pressure dressings were applied but were unsuccessful.  He ultimately ended up in the ICU with significant bleeding requiring intubation and a tourniquet.  He was resuscitated in the ICU and then taken emergently to the operating room for exploration.  Procedure:  The patient was identified in the holding area and taken to [place] [facility]  The patient was then placed supine on the table. general anesthesia was administered.  The patient was prepped and draped in the usual sterile fashion.  A time out was  called and antibiotics were administered.  At this point, the patient had a Esmarch around his thigh which had been present for approximately 20 minutes.  The wound was now hemostatic.  I visually evaluated the wound and explored it with my fingers and it was clear that the muscle on the posterior flap was not viable.  I did not think that tissue debridement would be satisfactory and so I elected to proceed with a more proximal amputation.  I made a fishmouth incision making sure to stay proximal to the nonviable tissue on the posterior flap.  Cautery was used to divide this subcutaneous tissue and fascia.  The muscle was then divided with cautery.  The femur was then circumferentially exposed.  A periosteal elevator was used to elevate the periosteum.  A power saw was used to transect the femur.  I then divided the remaining muscle with cautery and isolated the neurovascular bundle.  The leg was then removed and sent as a specimen.  I individually ligated the femoral artery and vein.  I then dissected out the nerve and ligated with a 2-0 silk tie.  It retracted well up into the wound.  I then achieved hemostasis with cautery.  All muscle at this level was viable.  There was no active evidence of infection.  I then placed a wound VAC.  The patient returned to the ICU in critical condition.   Disposition: To ICU intubated in critical condition   V. Annamarie Major, M.D., [facility] [place] Office: [phone] Pager:  [phone]

## 2020-01-02 NOTE — Consult Note (Signed)
Pharmacy Antibiotic Note  Ramil Foil. is a 64 y.o. male admitted on 12/27/2019 with cellulitis following AKA in March 2021.  He presents with pain, elevated WBC, and fevers, and 2/4 blood cultured bottles growing with Staph aureus (Methicillin resistance detected). Pharmacy was consulted for vancomycin dosing. This is day  # 2 of vancomycin  Plan: adjust vancomycin dose to 1000 mg IV Q 12 hrs Goal AUC 400-550 Expected AUC: 458.6 SCr used: 0.88 T1/2: 9.4 h Css (calculated): 28.8/12.8 mcg/mL  Height: 5\' 9"  (175.3 cm) Weight: 82.1 kg (181 lb) IBW/kg (Calculated) : 70.7  Temp (24hrs), Avg:98.6 F (37 C), Min:97.8 F (36.6 C), Max:99.9 F (37.7 C)  Recent Labs  Lab 12/27/2019 0358 12/02/2019 0545 12/11/2019 1712 12/19/2019 2037 01/10/2020 0424  WBC 12.8*  --  13.8* 12.7* 23.3*  CREATININE 0.69  --   --   --  0.88  LATICACIDVEN 1.2 1.0  --   --   --     Estimated Creatinine Clearance: 83.7 mL/min (by C-G formula based on SCr of 0.88 mg/dL).    Antimicrobials this admission: 5/31 cefepime >> 5/31 5/31 flagyl >> 5/31 5/31 vancomycin >>  Microbiology results: 5/31 BCx:  MRSA 5/31 UCx:  Negative 5/31 COVID (nasopharyngeal swab):  Negative  Thank you for allowing pharmacy to be a part of this patient's care.  Dallie Piles, PharmD 01/04/2020 7:06 AM

## 2020-01-02 NOTE — Anesthesia Postprocedure Evaluation (Signed)
Anesthesia Post Note  Patient: [name].  Procedure(s) Performed: AMPUTATION ABOVE KNEE (Right Knee)  Patient location during evaluation: ICU Anesthesia Type: General Level of consciousness: sedated Pain management: pain level controlled Vital Signs Assessment: post-procedure vital signs reviewed and stable Respiratory status: spontaneous breathing, nonlabored ventilation and patient on ventilator - see flowsheet for VS Cardiovascular status: blood pressure returned to baseline and stable Postop Assessment: no headache and no backache Anesthetic complications: no     Last Vitals:  Vitals:   01/20/2020 0500 01/13/2020 0600  BP: (!) 89/59 137/73  Pulse: 75 91  Resp: 20 (!) 21  Temp:    SpO2: 100% 98%    Last Pain:  Vitals:   01/13/2020 0430  TempSrc: Oral  PainSc:                  [name]

## 2020-01-02 NOTE — Progress Notes (Signed)
Pt received from Rm 152 to CCU # 2 via bed. A & O x 2 . Confused , VS  Unstable, A Fib with RVR;  RT. AKA surgical wound with profuse bleeding noted. NP notified, Will continue to monitor pt closely.

## 2020-01-02 DEATH — deceased

## 2020-01-03 ENCOUNTER — Encounter: Payer: Self-pay | Admitting: Anesthesiology

## 2020-01-03 ENCOUNTER — Inpatient Hospital Stay (HOSPITAL_COMMUNITY)
Admit: 2020-01-03 | Discharge: 2020-01-03 | Disposition: A | Discharge status: Home / Self Care | Payer: Medicare HMO | Attending: Infectious Diseases | Admitting: Infectious Diseases

## 2020-01-03 DIAGNOSIS — R7881 Bacteremia: Secondary | ICD-10-CM

## 2020-01-03 LAB — TYPE AND SCREEN
ABO/RH(D): B POS
Antibody Screen: NEGATIVE
Unit division: 0
Unit division: 0
Unit division: 0
Unit division: 0
Unit division: 0
Unit division: 0
Unit division: 0
Unit division: 0
Unit division: 0

## 2020-01-03 LAB — PROTIME-INR
INR: 2 — ABNORMAL HIGH (ref 0.8–1.2)
INR: 1.8 — ABNORMAL HIGH (ref 0.8–1.2)
Prothrombin Time: 22.3 seconds — ABNORMAL HIGH (ref 11.4–15.2)
Prothrombin Time: 19.8 seconds — ABNORMAL HIGH (ref 11.4–15.2)

## 2020-01-03 LAB — RENAL FUNCTION PANEL
Albumin: 2.4 g/dL — ABNORMAL LOW (ref 3.5–5.0)
Anion gap: 7 (ref 5–15)
BUN: 21 mg/dL (ref 8–23)
CO2: 21 mmol/L — ABNORMAL LOW (ref 22–32)
Calcium: 7.5 mg/dL — ABNORMAL LOW (ref 8.9–10.3)
Chloride: 109 mmol/L (ref 98–111)
Creatinine, Ser: 0.86 mg/dL (ref 0.61–1.24)
GFR calc Af Amer: >60 mL/min (ref >60)
GFR calc non Af Amer: >60 mL/min (ref >60)
Glucose, Bld: 231 mg/dL — ABNORMAL HIGH (ref 70–99)
Phosphorus: 2.8 mg/dL (ref 2.5–4.6)
Potassium: 4.1 mmol/L (ref 3.5–5.1)
Sodium: 137 mmol/L (ref 135–145)

## 2020-01-03 LAB — CBC
HCT: 24.9 % — ABNORMAL LOW (ref 39.0–52.0)
Hemoglobin: 8.4 g/dL — ABNORMAL LOW (ref 13.0–17.0)
MCH: 28.2 pg (ref 26.0–34.0)
MCHC: 33.7 g/dL (ref 30.0–36.0)
MCV: 83.6 fL (ref 80.0–100.0)
Platelets: 178 10*3/uL (ref 150–400)
RBC: 2.98 MIL/uL — ABNORMAL LOW (ref 4.22–5.81)
RDW: 16.9 % — ABNORMAL HIGH (ref 11.5–15.5)
WBC: 18.7 10*3/uL — ABNORMAL HIGH (ref 4.0–10.5)
nRBC: 0.4 % — ABNORMAL HIGH (ref 0.0–0.2)

## 2020-01-03 LAB — GLUCOSE, CAPILLARY
Glucose-Capillary: 200 mg/dL — ABNORMAL HIGH (ref 70–99)
Glucose-Capillary: 218 mg/dL — ABNORMAL HIGH (ref 70–99)
Glucose-Capillary: 214 mg/dL — ABNORMAL HIGH (ref 70–99)
Glucose-Capillary: 198 mg/dL — ABNORMAL HIGH (ref 70–99)
Glucose-Capillary: 228 mg/dL — ABNORMAL HIGH (ref 70–99)

## 2020-01-03 LAB — HEPATIC FUNCTION PANEL
ALT: 120 U/L — ABNORMAL HIGH (ref 0–44)
AST: 139 U/L — ABNORMAL HIGH (ref 15–41)
Albumin: 2.3 g/dL — ABNORMAL LOW (ref 3.5–5.0)
Alkaline Phosphatase: 64 U/L (ref 38–126)
Bilirubin, Direct: 0.4 mg/dL — ABNORMAL HIGH (ref 0.0–0.2)
Indirect Bilirubin: 0.8 mg/dL (ref 0.3–0.9)
Total Bilirubin: 1.2 mg/dL (ref 0.3–1.2)
Total Protein: 4.5 g/dL — ABNORMAL LOW (ref 6.5–8.1)

## 2020-01-03 LAB — BPAM FFP
Blood Product Expiration Date: 202106062359
ISSUE DATE / TIME: 202106010230
Unit Type and Rh: 7300

## 2020-01-03 LAB — BPAM RBC
Blood Product Expiration Date: 202106162359
Blood Product Expiration Date: 202106182359
Blood Product Expiration Date: 202107012359
Blood Product Expiration Date: 202107012359
Blood Product Expiration Date: 202107012359
Blood Product Expiration Date: 202107042359
Blood Product Expiration Date: 202107042359
Blood Product Expiration Date: 202107042359
Blood Product Expiration Date: 202107042359
ISSUE DATE / TIME: 202106010008
ISSUE DATE / TIME: 202106010118
ISSUE DATE / TIME: 202106010225
ISSUE DATE / TIME: 202106010225
ISSUE DATE / TIME: 202106010400
Unit Type and Rh: 1700
Unit Type and Rh: 1700
Unit Type and Rh: 5100
Unit Type and Rh: 5100
Unit Type and Rh: 5100
Unit Type and Rh: 7300
Unit Type and Rh: 7300
Unit Type and Rh: 7300
Unit Type and Rh: 7300

## 2020-01-03 LAB — PREPARE FRESH FROZEN PLASMA: Unit division: 0

## 2020-01-03 LAB — TRIGLYCERIDES: Triglycerides: 219 mg/dL — ABNORMAL HIGH (ref <150)

## 2020-01-03 LAB — PROCALCITONIN: Procalcitonin: 4.09 ng/mL

## 2020-01-03 LAB — MAGNESIUM: Magnesium: 1.7 mg/dL (ref 1.7–2.4)

## 2020-01-03 MED ORDER — ORAL CARE MOUTH RINSE
15.0000 mL | OROMUCOSAL | Status: DC
  Administered 2020-01-03 – 2020-01-05 (×21): 15 mL via OROMUCOSAL

## 2020-01-03 MED ORDER — PERFLUTREN LIPID MICROSPHERE
1.0000 mL | INTRAVENOUS | Status: AC | PRN
  Administered 2020-01-03: 2 mL via INTRAVENOUS
  Filled 2020-01-03: qty 10

## 2020-01-03 MED ORDER — CHLORHEXIDINE GLUCONATE 0.12% ORAL RINSE (MEDLINE KIT)
15.0000 mL | Freq: Two times a day (BID) | OROMUCOSAL | Status: DC
  Administered 2020-01-03 – 2020-01-04 (×4): 15 mL via OROMUCOSAL

## 2020-01-03 MED ORDER — MAGNESIUM SULFATE 2 GM/50ML IV SOLN
2.0000 g | Freq: Once | INTRAVENOUS | Status: AC
  Administered 2020-01-03: 2 g via INTRAVENOUS
  Filled 2020-01-03: qty 50

## 2020-01-03 NOTE — Progress Notes (Signed)
Name: [name]. MRN: [id number] DOB: [birth year]    ADMISSION DATE:  12/06/2019 CONSULTATION DATE:  01/12/2020  REFERRING MD :  Dr. Trula Slade  CHIEF COMPLAINT:  Fever, pain of Right AKA site  BRIEF PATIENT DESCRIPTION:  64 y.o. Male with PMH of liver cirrhosis, ascites, Hepatitis B, and Chronic A-fib on Eliquis,  admitted 12/13/2019 with Sepsis and MRSA Bacteremia in setting of surgical site infection of Right AKA (Right BKA performed in March, with sepsis of BKA in April 2021 requiring conversion to AKA and further I&D).  His wound VAC was removed on 12/14/2019.  On 01/20/2020 pt with hemorrhagic shock due to hemorrhage from AKA site requiring intubation and emergently taken to OR for redo of right AKA.  SIGNIFICANT EVENTS  5/31: Admission to Med/Surg unit 5/31: Removal of wound VAC by vascular surgery 5/31: Oozing from site despite pressure dressing and reinforcement of site, 2 point drop in hemoglobin; transfer to Stepdown 6/1: Hemorrhaging from site with hemorrhagic shock, required emergent intubation 6/1: Take to OR emergently for redo of right AKA 6/2-had family meeting with wife today discussed overall poor prognosis with numerous severe comorbid conditions and active life-threatening disease, wife agrees and will discuss with remainder of family regarding transitioning to comfort care with de-escalation of current therapy.  CODE STATUS has not been changed yet until family discusses amongst themselves and meet with [place] again.  STUDIES:  5/31: CXR>>There is mild cardiomegaly. There is slight prominence of the central pulmonary vasculature. No large airspace consolidation or pleural effusion. No acute osseous abnormality. 6/1: CXR>>There is mild cardiomegaly. Aortic knob calcifications. ETT is 3.7 cm above the carina. A right-sided central venous catheter seen with the tip in the mid SVC. OG tube is seen within the proximal stomach. There is mild prominence of the central pulmonary  vasculature. No pneumothorax is seen. 6/1: KUB>>Mildly dilated small bowel loops which may be due to partial small bowel obstruction versus ileus. OG tube within the proximal stomach.  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  His wife reported that since his debridement has had constant pain, which acutely worsened of late.  He denied chest pain, SOB, cough, abdominal pain, or change in bowel habits.  Upon presentation to the ED he was noted to be febrile with temperature 101.8, blood pressure 119/78, heart rate 124, respirations 20, with O2 saturations 90% on room air.  Work-up was significant for WBC 12.8, lactic acid 1.2, hemoglobin 9.3, high-sensitivity troponin 51.  Urinalysis without infection.  His SARS-CoV-2 PCR is negative.  He was admitted to the MedSurg unit by the Hospitalist for further work-up and treatment of sepsis secondary to surgical wound infection of his right AKA.  Vascular surgery was consulted.  Blood cultures came back positive for MRSA, of which infectious disease was subsequently consulted.  On 5/31 his wound VAC was removed by vascular surgery.  Post VAC removal he was noted to have continued oozing from  his wound despite efforts to reinforce the dressing and with the placement of a pressure dressing.  Rapid response was called due to continued bleeding and  tachycardia.  He was found to have a two-point drop in his hemoglobin along with INR of 2.8.  He was subsequently transferred to stepdown, and is to receive blood and FFP to reverse his coagulopathy.  Early in the morning on 01/29/2020 he developed profuse bleeding from his AKA site with hemorrhagic shock and hemodynamic instability requiring emergent intubation.  Vascular surgery is at bedside with plans to take patient emergently to the OR for redo right AKA.  PCCM is consulted for further management of hemorrhagic shock.  PAST MEDICAL HISTORY :   has a past medical history of Acute left PCA stroke ([place]) (02/17/2015), Anemia, Arteriovenous malformation of gastrointestinal tract, Atherosclerosis of native arteries of the extremities with gangrene ([place]) (05/08/2018), Atrial fibrillation ([place]), Blind, Complication of anesthesia (2019), Coronary artery calcification seen on CT scan, Diabetes mellitus with complication ([place]), Dilated aortic root ([place]), Dysrhythmia, Gangrene ([place]) (09/06/2019), Gangrene of right foot ([place]) (06/02/2018), GERD (gastroesophageal reflux disease), History of echocardiogram, History of hernia repair, History of stress test, BKA, right ([place]) (06/16/2018), Hyperlipidemia, Hypertension, Leg pain, Legally blind, Memory change, Necrotic toes ([place]) (02/21/2018), PAF (paroxysmal atrial fibrillation) ([place]), Peripheral vascular disease ([place]), Pulmonary nodules, S/P bilateral BKA (below knee amputation) ([place]) (06/11/2018), Stroke ([place]), and Stroke ([place]) (07/11/2018).  has a past surgical history that includes Appendectomy; Lower Extremity Angiography (Right, 10/25/2017); LOWER EXTREMITY INTERVENTION (10/25/2017); Tonsillectomy; Lower Extremity Angiography (Right, 01/13/2018); Colonoscopy with propofol (N/A, 05/12/2018); Esophagogastroduodenoscopy (egd) with propofol (N/A, 05/12/2018); Amputation (Right, 06/02/2018); Givens capsule study (N/A, 07/13/2018); Wound debridement (Right, 08/08/2018); Skin split graft  (Right, 12/28/2018); Lower Extremity Angiography (Right, 08/28/2019); Hernia repair; Abdominal aorta stent; Amputation (Right, 10/26/2019); Leg amputation (Right, 10/26/2019); and Incision and drainage (Right, 11/11/2019). Prior to Admission medications   Medication Sig Start Date End Date Taking? Authorizing Provider  acetaminophen (TYLENOL) 325 MG tablet Take 2 tablets (650 mg total) by mouth every 6 (six) hours as needed for mild pain (or Fever >/= 101). 11/29/19  Yes Wieting, Richard, MD  acidophilus (RISAQUAD) CAPS capsule Take 1 capsule by mouth daily. 12/19/19  Yes Johnson, Megan P, DO  apixaban (ELIQUIS) 5 MG TABS tablet Take 1 tablet (5 mg total) by mouth 2 (two) times daily. 10/19/19  Yes Johnson, Megan P, DO  cholecalciferol (VITAMIN D3) 25 MCG (1000 UT) tablet Take 5,000 Units by mouth daily.    Yes [provider]  docusate sodium (COLACE) 100 MG capsule Take 100 mg by mouth daily. 12/12/19  Yes [provider]  ferrous sulfate (FERROUSUL) 325 (65 FE) MG tablet Take 1 tablet (325 mg total) by mouth 2 (two) times daily with a meal. 03/14/19  Yes Earlie Server, MD  furosemide (LASIX) 20 MG tablet Take 1 tablet (20 mg total) by mouth daily. 12/19/19  Yes Johnson, Megan P, DO  gabapentin (NEURONTIN) 600 MG tablet Take 2 tablets (1,200 mg total) by mouth 3 (three) times daily. 12/19/19  Yes Johnson, Megan P, DO  hydrochlorothiazide (MICROZIDE) 12.5 MG capsule Take 12.5 mg by mouth daily. 12/31/19  Yes [provider]  metFORMIN (GLUCOPHAGE) 500 MG tablet Take 1 tablet (500 mg total) by mouth 2 (two) times daily with a meal. 12/19/19  Yes Johnson, Megan P, DO  metoprolol tartrate (LOPRESSOR) 50 MG tablet Take 1 tablet (50 mg total) by mouth 2 (two) times daily. 12/19/19  Yes Johnson, [place], DO  Multiple Vitamin (MULTIVITAMIN WITH MINERALS) TABS tablet Take 1 tablet by mouth daily. 06/24/17  Yes Fritzi Mandes, MD  oxyCODONE (OXY IR/ROXICODONE) 5 MG immediate release tablet Take 1-2  tablets (5-10 mg total) by mouth every 6 (six) hours as needed for up to 8 days for moderate pain, severe pain or breakthrough pain. 12/29/19 01/06/20 Yes Kris Hartmann, NP  pantoprazole (PROTONIX) 40 MG tablet Take 1 tablet (40 mg total) by mouth daily. 12/19/19  Yes Johnson, Megan P, DO  spironolactone (ALDACTONE) 25 MG tablet Take 1 tablet (25 mg total) by mouth daily. 12/19/19  Yes Johnson, Megan P, DO  traZODone (DESYREL) 50 MG tablet Take 0.5 tablets (25 mg total) by mouth at bedtime as needed for sleep. 12/19/19  Yes Johnson, Megan P, DO  vitamin C (ASCORBIC ACID) 500 MG tablet Take 500 mg by mouth daily.   Yes [provider]  Ensure Max Protein (ENSURE MAX PROTEIN) LIQD Take 330 mLs (11 oz total) by mouth 2 (two) times daily between meals. 11/29/19   Loletha Grayer, MD   Allergies  Allergen Reactions  . Sodium Pentobarbital [Pentobarbital] Shortness Of Breath  . Lipitor [Atorvastatin] Rash    FAMILY HISTORY:  family history includes Diabetes in his father; Hyperlipidemia in his father; Hypertension in his father. SOCIAL HISTORY:  reports that he quit smoking about 1 years ago. His smoking use included cigarettes. He has a 30.00 pack-year smoking history. He has never used smokeless tobacco. He reports current alcohol use. He reports that he does not use drugs.     REVIEW OF SYSTEMS:   Unable to assess due to critical illness and intubation  SUBJECTIVE:  Unable to assess due to critical illness and intubation  VITAL SIGNS: Temp:  [96.7 F (35.9 C)-98.4 F (36.9 C)] 96.7 F (35.9 C) (06/02 0700) Pulse Rate:  [49-93] 66 (06/02 0900) Resp:  [14-21] 20 (06/02 0900) BP: (93-134)/(60-81) 119/73 (06/02 0900) SpO2:  [96 %-99 %] 98 % (06/02 0900) FiO2 (%):  [35 %-40 %] 35 % (06/02 0754)  PHYSICAL EXAMINATION: General:  Critically ill appearing male, laying in bed, intubated and sedated, in NAD Neuro:  Sedated, withdraws from pain, Pupils PERRLA HEENT:  Atraumatic,  normocephalic, neck supple, no JVD Cardiovascular:  Irregularly irregular rhythm, rate controlled Lungs:  Clear to auscultation bilaterally, even, vent assisted Abdomen:  Soft, nontender, nondistended, no guarding or rebound tenderness, BS+ x4 Musculoskeletal:  Right AKA w/ Wound VAC clean, dry, and intact; no edema Skin:  Warm and dry.  No obvious rashes, lesions, or ulcerations  Recent Labs  Lab 12/28/2019 0358 01/08/2020 0424 01/03/20 0431  NA 132* 134* 137  K 3.8 3.9 4.1  CL 99 108 109  CO2 23 16* 21*  BUN _0 CREATININE 0.69 0.88 0.86  GLUCOSE 158* 264* 231*   Recent Labs  Lab 12/16/2019 2037 12/25/2019 2037 01/22/2020 0424 01/16/2020 0941 01/28/2020 1426 01/13/2020 2108 01/03/20 0440  HGB 6.4*   < > 6.9*   < > 10.4* 9.4* 8.4*  HCT 21.1*   < > 21.7*   < > 29.3* 27.6* 24.9*  WBC 12.7*  --  23.3*  --   --   --  18.7*  PLT 202  --  125*  --   --   --  178   < > = values in this interval not displayed.   DG Abd 1 View  Result Date: 01/29/2020 CLINICAL DATA:  OG tube placement EXAM: ABDOMEN - 1 VIEW COMPARISON:  None. FINDINGS: There is mildly prominent air-filled loops of small bowel within the left mid abdomen. There is air seen within the descending colon and rectum. OG tube tip is seen within the proximal stomach. Calcified gallstones within the right upper quadrant. Vascular stents within the deep pelvis. IMPRESSION: Mildly dilated small bowel loops which may be due to partial small bowel obstruction versus ileus. OG tube within the proximal stomach. Electronically Signed   By: Prudencio Pair M.D.   On: 01/15/2020 01:43   DG Chest Port 1 View  Result Date: 01/08/2020 CLINICAL DATA:  Intubation and line placement EXAM: PORTABLE CHEST 1 VIEW COMPARISON:  None. FINDINGS: There is mild cardiomegaly. Aortic knob calcifications. ETT is 3.7 cm above the carina. A right-sided central venous catheter seen with the tip in the mid SVC. OG tube is seen within the proximal stomach. There is mild  prominence of the central pulmonary vasculature. No pneumothorax is seen. IMPRESSION: Lines and tubes in satisfactory position. Electronically Signed   By: Prudencio Pair M.D.   On: 01/24/2020 01:42    ASSESSMENT / PLAN:  Hemorrhagic shock +/- Septic shock Atrial fibrillation with RVR Hx: Chronic A-fib on Eliquis -Continuous cardiac monitoring -Maintain MAP greater than 65 -IV fluids -Vasopressors as needed to maintain map goal -Transfusions as indicated -Amiodarone drip -No anticoagulation due to hemorrhage  Acute Blood loss anemia in setting of hemorrhage from Right AKA Supratherapeutic INR in setting of liver cirrhosis & Eliquis -Monitor for S/Sx of bleeding -Trend CBC -SCD's for VTE Prophylaxis (no chemical prophylaxis due to hemorrhage) -Transfuse for Hgb <8 -To receive FFP,  Kcentra, & Vitamin K (goal INR <2, Plt >50, Fibrinogen >100) -Hold Eliquis -Vascular Surgery to take emergently to OR for redo of AKA  Acute Hypoxic Respiratory Failure due to severe Shock & Hemodynamic Instabiity -Full vent support -Wean FiO2 and PEEP as tolerated to maintain O2 saturations greater than 92% -Follow intermittent chest x-ray and ABG as needed -VAP protocol -Spontaneous breathing trials when respiratory parameters met  Sepsis & MRSA Bacteremia due to infected Right AKA -Monitor fever curve -Trend WBCs and procalcitonin -Follow cultures as above -Continue vancomycin -ID consulted, appreciate input -Vascular Surgery following, appreciate input  Diabetes Mellitus Type 2 -CBG's -SSI -Follow ICU Hypo/hyperglycemia protocol     BEST PRACTICES DISPOSITION: ICU GOALS OF CARE: Full code VTE PROPHYLAXIS: SCD's STRESS ULCER PROPHYLAXIS: IV Protonix CONSULTS: Hospitalist (primary service), Vascular Surgery, Infectious Disease UPDATES: Pt's wife updated by Dr. Trula Slade with Vascular Surgery and [name], NP  Critical care provider statement:    Critical care time (minutes):   33   Critical care time was exclusive of:  Separately billable procedures and  treating other patients   Critical care was necessary to treat or prevent imminent or  life-threatening deterioration of the following conditions:  hemmoragic shock, hepatitis B, MRSA bacteremia, septic shock, necrotic lower extermity wound, multiple comorbid conditions   Critical care was time spent personally by me on the following  activities:  Development of treatment plan with patient or surrogate,  discussions with consultants, evaluation of patient's response to  treatment, examination of patient, obtaining history from patient or  surrogate, ordering and performing treatments and interventions, ordering  and review of laboratory studies and re-evaluation of patient's condition   I assumed direction of critical care for this patient from another  provider in my specialty: no     Ottie Glazier, M.D.  [facility] [place]  01/03/2020, 10:11 AM

## 2020-01-03 NOTE — Consult Note (Signed)
[facility] Nurse Consult Note: Consult received for possible assistance with NPWT dressing change tomorrow (01/04/20). Discussed plan with Vascular PA K. Stegmayer. If patient is on vent and still sedated, will change NPWT device at bedside.  If extubated and alert, she will plan for change in OR. This Probation officer to meet K. Stegmayer at bedside tomorrow at 9:30am.  [place] team will follow, and will remain available to this patient, the nursing and medical teams.  Thanks, Maudie Flakes, MSN, RN, [place], Arther Abbott  Pager# [phone]

## 2020-01-03 NOTE — Progress Notes (Signed)
[facility] Daily Progress Note  Subjective: 01/11/2020: #1:Redo right above-knee amputation (guillotine) #2: Placement of wound VAC  Patient is sedated and on vent. On pressors. Wife at bedside.  Objective: Vitals:   01/03/20 1130 01/03/20 1200 01/03/20 1230 01/03/20 1300  BP: 117/72 110/72 112/69 120/76  Pulse: 70 64 72 65  Resp: 20 20 20 20   Temp:  (!) 96.6 F (35.9 C)    TempSrc:  Axillary    SpO2: 98% 98% 98% 98%  Weight:      Height:        Intake/Output Summary (Last 24 hours) at 01/03/2020 1412 Last data filed at 01/03/2020 1200 Gross per 24 hour  Intake 3662.92 ml  Output 1450 ml  Net 2212.92 ml   Physical Exam: Sedated and on vent, NAD CV: Tachycardic Pulmonary: Decreased bilaterally Abdomen: Soft, Nontender, Nondistended Vascular:             Right lower extremity: AKA stump with VAC intact and to suction.  Thigh soft.   Laboratory: CBC    Component Value Date/Time   WBC 18.7 (H) 01/03/2020 0440   HGB 8.4 (L) 01/03/2020 0440   HGB 11.2 (L) 10/23/2019 1403   HCT 24.9 (L) 01/03/2020 0440   HCT 35.0 (L) 10/23/2019 1403   PLT 178 01/03/2020 0440   PLT 199 10/23/2019 1403   BMET    Component Value Date/Time   NA 137 01/03/2020 0431   NA 140 10/23/2019 1403   K 4.1 01/03/2020 0431   CL 109 01/03/2020 0431   CO2 21 (L) 01/03/2020 0431   GLUCOSE 231 (H) 01/03/2020 0431   BUN 21 01/03/2020 0431   BUN 17 10/23/2019 1403   CREATININE 0.86 01/03/2020 0431   CALCIUM 7.5 (L) 01/03/2020 0431   GFRNONAA >60 01/03/2020 0431   GFRAA >60 01/03/2020 0431   Assessment/Planning: 1) s/p redo right above-knee amputation (guillotine), placement of wound VAC - Patient is still on pressors, sedated and vented - positive blood cultures - on IV ABX -Plan is to change VAC at bedside tomorrow with assistance of wound nurse patient still intubated and sedated.  If extubated we will plan on VAC change and operating room.  Seen and examined with Dr.  Ellis Parents Yeng Frankie PA-C 01/03/2020 2:12 PM

## 2020-01-03 NOTE — Progress Notes (Signed)
No change in patient status; remains on vasoactive drips and sedation while mechanically ventilated.  No episodes of bleeding, minimal output on wound vac.  Pt wife updated on status and plan of care, pt family traveling up from [place] tomorrow, will discuss next steps with family when they are present.

## 2020-01-03 NOTE — Progress Notes (Signed)
ID Pt remains intubated On pressor Pale Patient Vitals for the past 24 hrs:  BP Temp Temp src Pulse Resp SpO2  01/03/20 1537 -- -- -- -- -- 96 %  01/03/20 1400 118/76 -- -- 64 20 97 %  01/03/20 1330 115/67 -- -- 67 20 98 %  01/03/20 1300 120/76 -- -- 65 20 98 %  01/03/20 1230 112/69 -- -- 72 20 98 %  01/03/20 1200 110/72 (!) 96.6 F (35.9 C) Axillary 64 20 98 %  01/03/20 1130 117/72 -- -- 70 20 98 %  01/03/20 1104 -- -- -- -- -- 98 %  01/03/20 1100 110/65 -- -- 68 20 98 %  01/03/20 1030 104/65 -- -- 62 20 98 %  01/03/20 1000 103/64 -- -- 63 20 98 %  01/03/20 0930 114/67 -- -- 67 20 98 %  01/03/20 0900 119/73 -- -- 66 20 98 %  01/03/20 0830 123/60 -- -- 68 20 97 %  01/03/20 0800 116/77 -- -- 72 20 97 %  01/03/20 0754 -- -- -- -- -- 98 %  01/03/20 0730 120/72 -- -- 69 20 97 %  01/03/20 0700 119/67 (!) 96.7 F (35.9 C) Axillary 66 20 97 %  01/03/20 0600 115/64 -- -- 63 20 97 %  01/03/20 0500 115/69 -- -- 67 20 97 %  01/03/20 0400 116/70 98.4 F (36.9 C) Axillary 71 20 97 %  01/03/20 0300 120/71 -- -- 65 20 97 %  01/03/20 0200 112/76 -- -- 74 20 97 %  01/03/20 0100 113/76 -- -- 70 20 98 %  01/03/20 0000 113/70 98.2 F (36.8 C) Axillary 64 20 97 %  01/20/2020 2300 108/73 -- -- 69 20 97 %  01/25/2020 2200 111/70 -- -- 69 20 97 %  01/09/2020 2100 106/68 -- -- 70 20 97 %  01/29/2020 2000 102/80 98.1 F (36.7 C) Axillary 73 20 97 %  01/20/2020 1926 -- -- -- -- -- 98 %  01/04/2020 1900 93/65 -- -- 70 19 97 %  01/15/2020 1800 96/66 -- -- 71 20 97 %  01/26/2020 1700 106/67 -- -- (!) 49 14 97 %    Rt aka stump- dressing present Foley catheter Rt IJ CVC NG tube  Labs CBC Latest Ref Rng & Units 01/03/2020 01/19/2020 01/15/2020  WBC 4.0 - 10.5 K/uL 18.7(H) - -  Hemoglobin 13.0 - 17.0 g/dL 8.4(L) 9.4(L) 10.4(L)  Hematocrit 39.0 - 52.0 % 24.9(L) 27.6(L) 29.3(L)  Platelets 150 - 400 K/uL 178 - -    CMP Latest Ref Rng & Units 01/03/2020 01/19/2020 12/16/2019  Glucose 70 - 99 mg/dL 231(H) 264(H) 158(H)   BUN 8 - 23 mg/dL 21 18 18   Creatinine 0.61 - 1.24 mg/dL 0.86 0.88 0.69  Sodium 135 - 145 mmol/L 137 134(L) 132(L)  Potassium 3.5 - 5.1 mmol/L 4.1 3.9 3.8  Chloride 98 - 111 mmol/L 109 108 99  CO2 22 - 32 mmol/L 21(L) 16(L) 23  Calcium 8.9 - 10.3 mg/dL 7.5(L) 6.5(L) 8.9  Total Protein 6.5 - 8.1 g/dL - 3.7(L) 7.3  Total Bilirubin 0.3 - 1.2 mg/dL - 1.5(H) 1.1  Alkaline Phos 38 - 126 U/L - 48 105  AST 15 - 41 U/L - 278(H) 18  ALT 0 - 44 U/L - 132(H) 14    Impression/recommendation Impression/Recommendation ? ?MRSA bacteremia secondary to infected AKA stump Repeat blood cultures to look for clearance of the bacteremia  will get 2 d echo- to look for vegetation - may  need TEE   Necrotic and infected right AKA stump status post amputation by guillotine and has a wound VAC.  Hemorrhagic shock secondary to bleeding from the AKA stump.  Also his liver disease contributing to the coagulopathy.  Received blood transfusion, FFP   Cirrhosis of the liver  Transaminitis could be from shock with underlying cirrhosis  Hepatitis B infection  Discussed the management with his nurse and his wife

## 2020-01-03 NOTE — Progress Notes (Addendum)
   01/03/20 1500  Clinical Encounter Type  Visited With Patient and family together  Visit Type Initial;Spiritual support  Referral From Chaplain  Consult/Referral To Chaplain  Chaplain stopped in to check on patient and wife. Wife is at bedside. Chaplain briefly spoke with wife and she asked for prayer. Chaplain prayed with wife and told her chaplain availability. Chaplain encouraged wife to get rest. Wife said patient's sister and brother-in-law are on their way from [facility]. Chaplain will follow up.

## 2020-01-03 NOTE — Progress Notes (Signed)
Inpatient Diabetes Program Recommendations  AACE/ADA: New Consensus Statement on Inpatient Glycemic Control (2015)  Target Ranges:  Prepandial:   less than 140 mg/dL      Peak postprandial:   less than 180 mg/dL (1-2 hours)      Critically ill patients:  140 - 180 mg/dL   Lab Results  Component Value Date   GLUCAP 214 (H) 01/03/2020   HGBA1C 6.3 (H) 12/11/2019    Review of Glycemic Control Results for Mcguinness, Jaice B JR. "BEN" (MRN [id number]) as of 01/03/2020 15:38  Ref. Range 01/15/2020 21:52 01/13/2020 23:39 01/03/2020 03:43 01/03/2020 07:17 01/03/2020 11:12  Glucose-Capillary Latest Ref Range: 70 - 99 mg/dL 235 (H) 191 (H) 200 (H) 218 (H) 214 (H)   Diabetes history: DM2 Outpatient Diabetes medications: Metformin 500 mg bid Current orders for Inpatient glycemic control: Novolog sensitive correction tid + hs 0-5  Inpatient Diabetes Program Recommendations:   While patient is NPO on ventilator: -ICU glycemic control order set Or change correction scale to q 4 hrs.  Thank you, Nani Gasser. Fred Hammes, RN, MSN, CDE  Diabetes Coordinator Inpatient Glycemic Control Team Team Pager [phone] (8am-5pm) 01/03/2020 3:39 PM

## 2020-01-04 ENCOUNTER — Encounter: Payer: Self-pay | Admitting: Internal Medicine

## 2020-01-04 ENCOUNTER — Inpatient Hospital Stay: Payer: Medicare HMO

## 2020-01-04 DIAGNOSIS — Z515 Encounter for palliative care: Secondary | ICD-10-CM

## 2020-01-04 DIAGNOSIS — Z7189 Other specified counseling: Secondary | ICD-10-CM

## 2020-01-04 LAB — PROTIME-INR
INR: 1.6 — ABNORMAL HIGH (ref 0.8–1.2)
Prothrombin Time: 18.1 seconds — ABNORMAL HIGH (ref 11.4–15.2)

## 2020-01-04 LAB — ECHOCARDIOGRAM COMPLETE
Height: 69 in
Weight: 2895.96 oz

## 2020-01-04 LAB — CBC
HCT: 22.2 % — ABNORMAL LOW (ref 39.0–52.0)
Hemoglobin: 7.6 g/dL — ABNORMAL LOW (ref 13.0–17.0)
MCH: 28.7 pg (ref 26.0–34.0)
MCHC: 34.2 g/dL (ref 30.0–36.0)
MCV: 83.8 fL (ref 80.0–100.0)
Platelets: 122 10*3/uL — ABNORMAL LOW (ref 150–400)
RBC: 2.65 MIL/uL — ABNORMAL LOW (ref 4.22–5.81)
RDW: 17.2 % — ABNORMAL HIGH (ref 11.5–15.5)
WBC: 21.9 10*3/uL — ABNORMAL HIGH (ref 4.0–10.5)
nRBC: 0.5 % — ABNORMAL HIGH (ref 0.0–0.2)

## 2020-01-04 LAB — CULTURE, BLOOD (ROUTINE X 2)

## 2020-01-04 LAB — BASIC METABOLIC PANEL
Anion gap: 5 (ref 5–15)
BUN: 25 mg/dL — ABNORMAL HIGH (ref 8–23)
CO2: 20 mmol/L — ABNORMAL LOW (ref 22–32)
Calcium: 7.5 mg/dL — ABNORMAL LOW (ref 8.9–10.3)
Chloride: 107 mmol/L (ref 98–111)
Creatinine, Ser: 0.81 mg/dL (ref 0.61–1.24)
GFR calc Af Amer: >60 mL/min (ref >60)
GFR calc non Af Amer: >60 mL/min (ref >60)
Glucose, Bld: 201 mg/dL — ABNORMAL HIGH (ref 70–99)
Potassium: 3.8 mmol/L (ref 3.5–5.1)
Sodium: 132 mmol/L — ABNORMAL LOW (ref 135–145)

## 2020-01-04 LAB — TRIGLYCERIDES: Triglycerides: 422 mg/dL — ABNORMAL HIGH (ref <150)

## 2020-01-04 LAB — GLUCOSE, CAPILLARY
Glucose-Capillary: 197 mg/dL — ABNORMAL HIGH (ref 70–99)
Glucose-Capillary: 49 mg/dL — ABNORMAL LOW (ref 70–99)

## 2020-01-04 LAB — PROCALCITONIN: Procalcitonin: 1.77 ng/mL

## 2020-01-04 IMAGING — DX DG CHEST 1V PORT
1 series · 1 of 1 positions shown · non-contrast
Comparison: 01/02/2020

CLINICAL DATA: Acute respiratory failure.  Ventilator support.

EXAM:
PORTABLE CHEST 1 VIEW

[chest ap]
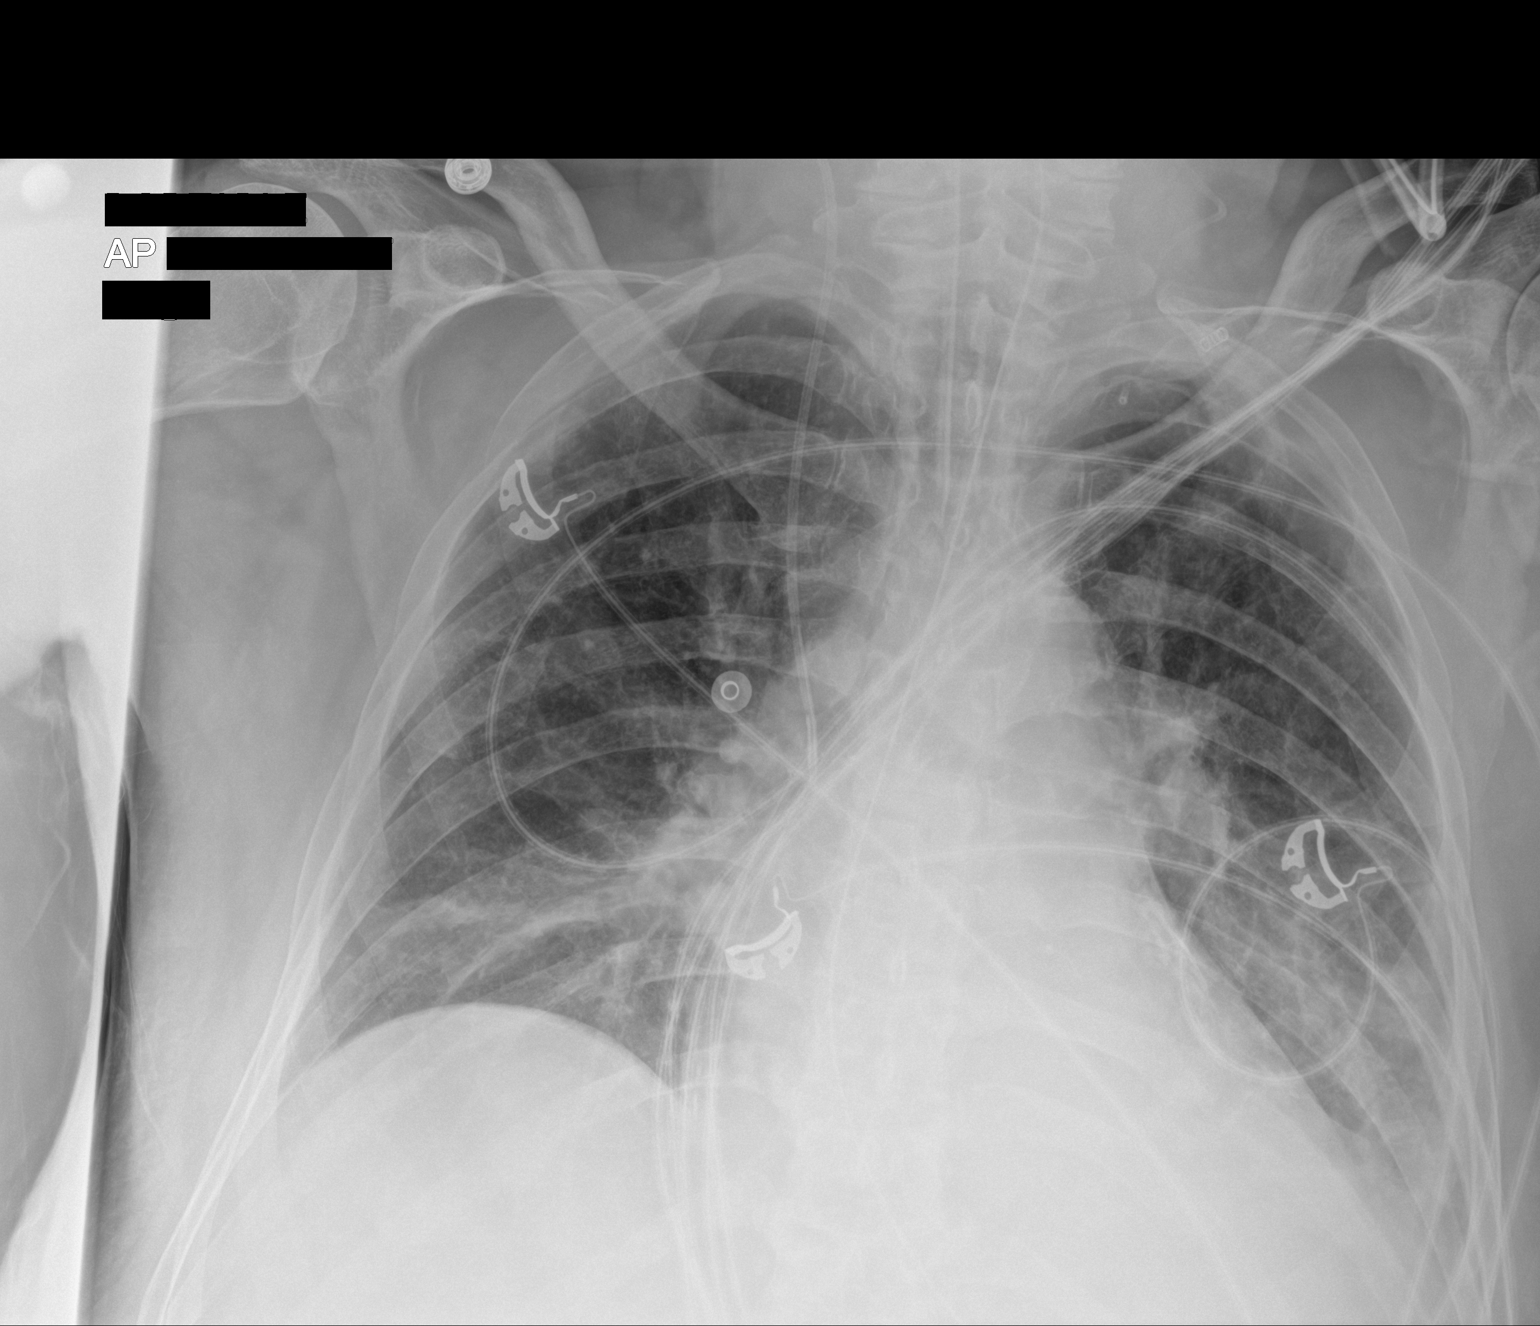

[1 of 1 positions shown; findings below may reference images not displayed]

FINDINGS: Endotracheal tube tip is 5 cm above the carina. Orogastric or
nasogastric tube enters the abdomen. Right internal jugular central
line tip in the SVC above the right atrium. There is worsening of
atelectasis or infiltrate in both lower lobes.
IMPRESSION: Lines and tubes well positioned. Worsening of atelectasis in both
lower lobes.

## 2020-01-04 MED ORDER — MIDAZOLAM HCL 2 MG/2ML IJ SOLN: 1.0000 mg | INTRAMUSCULAR | Status: DC | PRN

## 2020-01-04 MED ORDER — HALOPERIDOL LACTATE 2 MG/ML PO CONC
0.5000 mg | ORAL | Status: DC | PRN
  Filled 2020-01-04: qty 0.3

## 2020-01-04 MED ORDER — GLYCOPYRROLATE 0.2 MG/ML IJ SOLN
0.2000 mg | INTRAMUSCULAR | Status: DC | PRN
  Administered 2020-01-05 (×2): 0.2 mg via INTRAVENOUS
  Filled 2020-01-04 (×3): qty 1

## 2020-01-04 MED ORDER — GLYCOPYRROLATE 1 MG PO TABS
1.0000 mg | ORAL_TABLET | ORAL | Status: DC | PRN
  Filled 2020-01-04: qty 1

## 2020-01-04 MED ORDER — LORAZEPAM 2 MG/ML IJ SOLN
2.0000 mg | INTRAMUSCULAR | Status: DC | PRN
  Administered 2020-01-04 – 2020-01-05 (×7): 2 mg via INTRAVENOUS
  Filled 2020-01-04 (×4): qty 1

## 2020-01-04 MED ORDER — GLYCOPYRROLATE 0.2 MG/ML IJ SOLN
0.2000 mg | INTRAMUSCULAR | Status: DC | PRN
  Administered 2020-01-05: 0.2 mg via SUBCUTANEOUS
  Filled 2020-01-04: qty 1

## 2020-01-04 MED ORDER — MORPHINE 100MG IN NS 100ML (1MG/ML) PREMIX INFUSION
2.0000 mg/h | INTRAVENOUS | Status: DC
  Administered 2020-01-04: 2 mg/h via INTRAVENOUS
  Filled 2020-01-04: qty 100

## 2020-01-04 MED ORDER — BIOTENE DRY MOUTH MT LIQD: 15.0000 mL | OROMUCOSAL | Status: DC | PRN

## 2020-01-04 MED ORDER — MORPHINE 100MG IN NS 100ML (1MG/ML) PREMIX INFUSION
2.0000 mg/h | INTRAVENOUS | Status: DC
  Administered 2020-01-04: 14 mg/h via INTRAVENOUS
  Administered 2020-01-05: 16 mg/h via INTRAVENOUS
  Filled 2020-01-04 (×2): qty 100

## 2020-01-04 MED ORDER — HALOPERIDOL LACTATE 5 MG/ML IJ SOLN: 0.5000 mg | INTRAMUSCULAR | Status: DC | PRN

## 2020-01-04 MED ORDER — POLYVINYL ALCOHOL 1.4 % OP SOLN
1.0000 [drp] | Freq: Four times a day (QID) | OPHTHALMIC | Status: DC | PRN
  Filled 2020-01-04: qty 15

## 2020-01-04 MED ORDER — DEXMEDETOMIDINE HCL IN NACL 400 MCG/100ML IV SOLN
0.4000 ug/kg/h | INTRAVENOUS | Status: DC
  Administered 2020-01-04 (×2): 1.2 ug/kg/h via INTRAVENOUS
  Filled 2020-01-04: qty 100

## 2020-01-04 MED ORDER — HALOPERIDOL 0.5 MG PO TABS
0.5000 mg | ORAL_TABLET | ORAL | Status: DC | PRN
  Filled 2020-01-04: qty 1

## 2020-01-04 NOTE — Progress Notes (Addendum)
Moaning in pain. Fentanyl restarted at 21mcg for comfort. Patient settled down. Wife with patient. Remains on room air and Morphine drip.

## 2020-01-04 NOTE — Progress Notes (Signed)
Name: [name]. MRN: [id number] DOB: October 21, 1955    ADMISSION DATE:  12/05/2019 CONSULTATION DATE:  01/30/2020  REFERRING MD :  Dr. Trula Slade  CHIEF COMPLAINT:  Fever, pain of Right AKA site  BRIEF PATIENT DESCRIPTION:  64 y.o. Male with PMH of liver cirrhosis, ascites, Hepatitis B, and Chronic A-fib on Eliquis,  admitted 12/31/2019 with Sepsis and MRSA Bacteremia in setting of surgical site infection of Right AKA (Right BKA performed in March, with sepsis of BKA in April 2021 requiring conversion to AKA and further I&D).  His wound VAC was removed on 12/25/2019.  On 01/21/2020 pt with hemorrhagic shock due to hemorrhage from AKA site requiring intubation and emergently taken to OR for redo of right AKA.  SIGNIFICANT EVENTS  5/31: Admission to Med/Surg unit 5/31: Removal of wound VAC by vascular surgery 5/31: Oozing from site despite pressure dressing and reinforcement of site, 2 point drop in hemoglobin; transfer to Stepdown 6/1: Hemorrhaging from site with hemorrhagic shock, required emergent intubation 6/1: Take to OR emergently for redo of right AKA 6/2-had family meeting with wife today discussed overall poor prognosis with numerous severe comorbid conditions and active life-threatening disease, wife agrees and will discuss with remainder of family regarding transitioning to comfort care with de-escalation of current therapy.  CODE STATUS has not been changed yet until family discusses amongst themselves and meet with [place] again. 01/04/20- Had lengthy goal of care discussion today. Details of family conference are outlined by Palliative care team who was present during discourse.   Patient transitioned to comfort care only.   STUDIES:  5/31: CXR>>There is mild cardiomegaly. There is slight prominence of the central pulmonary vasculature. No large airspace consolidation or pleural effusion. No acute osseous abnormality. 6/1: CXR>>There is mild cardiomegaly. Aortic knob calcifications. ETT is  3.7 cm above the carina. A right-sided central venous catheter seen with the tip in the mid SVC. OG tube is seen within the proximal stomach. There is mild prominence of the central pulmonary vasculature. No pneumothorax is seen. 6/1: KUB>>Mildly dilated small bowel loops which may be due to partial small bowel obstruction versus ileus. OG tube within the proximal stomach.  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  His wife reported that since his debridement has had constant pain, which acutely worsened of late.  He denied chest pain, SOB, cough, abdominal pain, or change in bowel habits.  Upon presentation to the ED he was noted to be febrile with temperature 101.8, blood pressure 119/78, heart rate 124, respirations 20, with O2 saturations 90% on room air.  Work-up was significant for WBC 12.8, lactic acid 1.2, hemoglobin 9.3, high-sensitivity troponin 51.  Urinalysis without infection.  His SARS-CoV-2 PCR is negative.  He was admitted to the MedSurg unit by the Hospitalist for further work-up and treatment of sepsis secondary to surgical wound infection of his right AKA.  Vascular surgery was consulted.  Blood cultures came back positive  for MRSA, of which infectious disease was subsequently consulted.  On 5/31 his wound VAC was removed by vascular surgery.  Post VAC removal he was noted  to have continued oozing from his wound despite efforts to reinforce the dressing and with the placement of a pressure dressing.  Rapid response was called due to continued bleeding and tachycardia.  He was found to have a two-point drop in his hemoglobin along with INR of 2.8.  He was subsequently transferred to stepdown, and is to receive blood and FFP to reverse his coagulopathy.  Early in the morning on 01/22/2020 he developed profuse bleeding from his AKA site with hemorrhagic shock and hemodynamic instability requiring emergent intubation.  Vascular surgery is at bedside with plans to take patient emergently to the OR for redo right AKA.  PCCM is consulted for further management of hemorrhagic shock.  PAST MEDICAL HISTORY :   has a past medical history of Acute left PCA stroke ([place]) (02/17/2015), Anemia, Arteriovenous malformation of gastrointestinal tract, Atherosclerosis of native arteries of the extremities with gangrene ([place]) (05/08/2018), Atrial fibrillation ([place]), Blind, Complication of anesthesia (2019), Coronary artery calcification seen on CT scan, Diabetes mellitus with complication ([place]), Dilated aortic root ([place]), Dysrhythmia, Gangrene ([place]) (09/06/2019), Gangrene of right foot ([place]) (06/02/2018), GERD (gastroesophageal reflux disease), History of echocardiogram, History of hernia repair, History of stress test, BKA, right ([place]) (06/16/2018), Hyperlipidemia, Hypertension, Leg pain, Legally blind, Memory change, Necrotic toes ([place]) (02/21/2018), PAF (paroxysmal atrial fibrillation) ([place]), Peripheral vascular disease ([place]), Pulmonary nodules, S/P bilateral BKA (below knee amputation) ([place]) (06/11/2018), Stroke ([place]), and Stroke ([place]) (07/11/2018).  has a past surgical history that includes Appendectomy; Lower Extremity Angiography (Right, 10/25/2017); LOWER EXTREMITY INTERVENTION (10/25/2017); Tonsillectomy; Lower Extremity Angiography (Right, 01/13/2018); Colonoscopy with propofol (N/A, 05/12/2018);  Esophagogastroduodenoscopy (egd) with propofol (N/A, 05/12/2018); Amputation (Right, 06/02/2018); Givens capsule study (N/A, 07/13/2018); Wound debridement (Right, 08/08/2018); Skin split graft (Right, 12/28/2018); Lower Extremity Angiography (Right, 08/28/2019); Hernia repair; Abdominal aorta stent; Amputation (Right, 10/26/2019); Leg amputation (Right, 10/26/2019); and Incision and drainage (Right, 11/11/2019). Prior to Admission medications   Medication Sig Start Date End Date Taking? Authorizing Provider  acetaminophen (TYLENOL) 325 MG tablet Take 2 tablets (650 mg total) by mouth every 6 (six) hours as needed for mild pain (or Fever >/= 101). 11/29/19  Yes Wieting, Richard, MD  acidophilus (RISAQUAD) CAPS capsule Take 1 capsule by mouth daily. 12/19/19  Yes Johnson, Megan P, DO  apixaban (ELIQUIS) 5 MG TABS tablet Take 1 tablet (5 mg total) by mouth 2 (two) times daily. 10/19/19  Yes Johnson, Megan P, DO  cholecalciferol (VITAMIN D3) 25 MCG (1000 UT) tablet Take 5,000 Units by mouth daily.    Yes [provider]  docusate sodium (COLACE) 100 MG capsule Take 100 mg by mouth daily. 12/12/19  Yes [provider]  ferrous sulfate (FERROUSUL) 325 (65 FE) MG tablet Take 1 tablet (325 mg total) by mouth 2 (two) times daily with a meal. 03/14/19  Yes Earlie Server, MD  furosemide (LASIX) 20 MG tablet Take 1 tablet (20 mg total) by mouth daily. 12/19/19  Yes Johnson, Megan P, DO  gabapentin (NEURONTIN) 600 MG tablet Take 2 tablets (1,200 mg total) by mouth 3 (three) times daily. 12/19/19  Yes Johnson, Megan P, DO  hydrochlorothiazide (MICROZIDE) 12.5 MG capsule Take 12.5 mg by mouth daily. 12/31/19  Yes [provider]  metFORMIN (GLUCOPHAGE) 500 MG tablet Take 1 tablet (500 mg total) by mouth 2 (two) times daily with a meal. 12/19/19  Yes Johnson, Megan P, DO  metoprolol tartrate (LOPRESSOR) 50 MG tablet Take 1 tablet (50 mg total) by mouth 2 (two) times daily. 12/19/19  Yes Johnson, Megan P, DO   Multiple Vitamin (MULTIVITAMIN WITH MINERALS) TABS tablet Take 1 tablet by mouth daily. 06/24/17  Yes Fritzi Mandes, MD  oxyCODONE (OXY IR/ROXICODONE) 5 MG immediate release tablet Take 1-2 tablets (5-10 mg total) by mouth every 6 (six) hours as needed for up to 8 days for moderate pain, severe pain or breakthrough pain. 12/29/19 01/06/20 Yes Kris Hartmann, NP  pantoprazole (PROTONIX) 40 MG tablet Take 1 tablet (40 mg total) by mouth daily. 12/19/19  Yes Johnson, Megan P, DO  spironolactone (ALDACTONE) 25 MG tablet Take 1 tablet (25 mg total) by mouth daily. 12/19/19  Yes Johnson, Megan P, DO  traZODone (DESYREL) 50 MG tablet Take 0.5 tablets (25 mg total) by mouth at bedtime as needed for sleep. 12/19/19  Yes Johnson, Megan P, DO  vitamin C (ASCORBIC ACID) 500 MG tablet Take 500 mg by mouth daily.   Yes [provider]  Ensure Max Protein (ENSURE MAX PROTEIN) LIQD Take 330 mLs (11 oz total) by mouth 2 (two) times daily between meals. 11/29/19   Loletha Grayer, MD   Allergies  Allergen Reactions  . Sodium Pentobarbital [Pentobarbital] Shortness Of Breath  . Lipitor [Atorvastatin] Rash    FAMILY HISTORY:  family history includes Diabetes in his father; Hyperlipidemia in his father; Hypertension in his father. SOCIAL HISTORY:  reports that he quit smoking about 1 years ago. His smoking use included cigarettes. He has a 30.00 pack-year smoking history. He has never used smokeless tobacco. He reports current alcohol use. He reports that he does not use drugs.     REVIEW OF SYSTEMS:   Unable to assess due to critical illness and intubation  SUBJECTIVE:  Unable to assess due to critical illness and intubation  VITAL SIGNS: Temp:  [97.6 F (36.4 C)-98.1 F (36.7 C)] 98.1 F (36.7 C) (06/03 1200) Pulse Rate:  [51-104] 60 (06/03 1200) Resp:  [18-21] 20 (06/03 1200) BP: (87-149)/(57-90) 89/64 (06/03 1200) SpO2:  [96 %-99 %] 99 % (06/03 1200) FiO2 (%):  [35 %] 35 % (06/03  1200)  PHYSICAL EXAMINATION: General:  Critically ill appearing male, laying in bed, intubated and sedated, in NAD Neuro:  Sedated, withdraws from pain, Pupils PERRLA HEENT:  Atraumatic, normocephalic, neck supple, no JVD Cardiovascular:  Irregularly irregular rhythm, rate controlled Lungs:  Clear to auscultation bilaterally, even, vent assisted Abdomen:  Soft, nontender, nondistended, no guarding or rebound tenderness, BS+ x4 Musculoskeletal:  Right AKA w/ Wound VAC clean, dry, and intact; no edema Skin:  Warm and dry.  No obvious rashes, lesions, or ulcerations  Recent Labs  Lab 01/26/2020 0424 01/03/20 0431 01/04/20 0441  NA 134* 137 132*  K 3.9 4.1 3.8  CL 108 109 107  CO2 16* 21* 20*  BUN 18 21 25*  CREATININE 0.88 0.86 0.81  GLUCOSE 264* 231* 201*   Recent Labs  Lab 01/17/2020 0424 01/20/2020 0941 01/17/2020 2108 01/03/20 0440 01/04/20 0441  HGB 6.9*   < > 9.4* 8.4* 7.6*  HCT 21.7*   < > 27.6* 24.9* 22.2*  WBC 23.3*  --   --  18.7* 21.9*  PLT 125*  --   --  178 122*   < > = values in this interval not displayed.   DG Chest Port 1 View  Result Date: 01/04/2020 CLINICAL DATA:  Acute respiratory failure.  Ventilator support. EXAM: PORTABLE CHEST 1 VIEW COMPARISON:  01/20/2020 FINDINGS: Endotracheal tube tip is 5 cm above the carina. Orogastric or nasogastric tube enters the abdomen. Right internal jugular central line tip in the SVC above the right atrium. There is worsening of atelectasis or infiltrate in both lower lobes. IMPRESSION: Lines and tubes well positioned. Worsening of atelectasis in both lower lobes. Electronically Signed   By: Nelson Chimes M.D.   On: 01/04/2020 07:45    ASSESSMENT / PLAN:  Hemorrhagic shock +/- Septic shock Atrial fibrillation with RVR Hx: Chronic A-fib on Eliquis -Continuous cardiac monitoring -Maintain MAP greater than 65 -IV fluids -Vasopressors as needed to maintain map goal -Transfusions as indicated -Amiodarone drip -No  anticoagulation due to hemorrhage  Acute Blood loss anemia in setting of hemorrhage from Right AKA Supratherapeutic INR in setting of liver cirrhosis & Eliquis -Monitor for S/Sx of bleeding -Trend CBC -SCD's for VTE Prophylaxis (no chemical prophylaxis due to hemorrhage) -Transfuse for Hgb <8 -To receive FFP,  Kcentra, & Vitamin K (goal INR <2, Plt >50, Fibrinogen >100) -Hold Eliquis -Vascular Surgery to take emergently to OR for redo of AKA  Acute Hypoxic Respiratory Failure due to severe Shock & Hemodynamic Instabiity -Full vent support -Wean FiO2 and PEEP as tolerated to maintain O2 saturations greater than 92% -Follow intermittent chest x-ray and ABG as needed -VAP protocol -Spontaneous breathing trials when respiratory parameters met  Sepsis & MRSA Bacteremia due to infected Right AKA -Monitor fever curve -Trend WBCs and procalcitonin -Follow cultures as above -Continue vancomycin -ID consulted, appreciate input -Vascular Surgery following, appreciate input  Diabetes Mellitus Type 2 -CBG's -SSI -Follow ICU Hypo/hyperglycemia protocol     BEST PRACTICES DISPOSITION: ICU GOALS OF CARE: Full code VTE PROPHYLAXIS: SCD's STRESS ULCER PROPHYLAXIS: IV Protonix CONSULTS: Hospitalist (primary service), Vascular Surgery, Infectious Disease UPDATES: Pt's wife updated by Dr. Trula Slade with Vascular Surgery and [name], NP  Critical care provider statement:    Critical care time (minutes):  33   Critical care time was exclusive of:  Separately billable procedures and  treating other patients   Critical care was necessary to treat or prevent imminent or  life-threatening deterioration of the following conditions:  hemmoragic shock, hepatitis B, MRSA bacteremia, septic shock, necrotic lower extermity wound, multiple comorbid conditions   Critical care was time spent personally by me on the following  activities:  Development of treatment plan with patient or surrogate,   discussions with consultants, evaluation of patient's response to  treatment, examination of patient, obtaining history from patient or  surrogate, ordering and performing treatments and interventions, ordering  and review of laboratory studies and re-evaluation of patient's condition   I assumed direction of critical care for this patient from another  provider in my specialty: no     Ottie Glazier, M.D.  Pulmonary & [place]    01/04/2020, 12:30 PM

## 2020-01-04 NOTE — Consult Note (Signed)
Consultation Note Date: 01/04/2020   Patient Name: [name].  DOB: 04-16-1956  MRN: [id number]  Age / Sex: 64 y.o., male  PCP: [name], DO Referring Physician: Nolberto Hanlon, MD  Reason for Consultation: Establishing goals of care and Psychosocial/spiritual support  HPI/Patient Profile: 64 y.o. male  with past medical history of liver cirrhosis with ascites, hepatitis B, chronic A. 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   Clinical Assessment and Goals of Care: Mr. Sleight is intubate/ventilated sedated.  He is acutely/chronically ill.  Wife of 7 years, Neoma Laming and Ben's sister Marcie Bal and her husband are at bedside.  We go to the family room to have a meeting with CCM Dr. Lanney Gins.   Dr. Loni Muse. talks about Ben's acute and chronic health issues, including but not limited to, liver issues, bacteremia, gangrene and amputation, mechanical ventilation. We talk about the seriousness of his illness, treatment plan and options, declines, and expected outcomes.  Neoma Laming and Marcie Bal share Ben's advance directives, that he would not want to live on machines.   We talk about extubation and comfort care. Neoma Laming shares that there are no other family members who need to be here. Neoma Laming would like to be present after extubation.  I encourage her to lean on nursing staff who are experienced.   Detailed conference with attending and bedside nursing staff related to patient condition, needs, compassionate extubation.   Orders adjusted for comfort care.    HCPOA  NEXT OF KIN -wife of 7 years, Neoma Laming [place]     SUMMARY OF RECOMMENDATIONS   Full comfort care, DNR status Anticipate hospital death  Code Status/Advance Care Planning:  DNR  Symptom Management:    Comfort measures orders implemented  Palliative Prophylaxis:   Frequent Pain Assessment, Oral Care, Palliative Wound Care and Turn Reposition  Additional Recommendations (Limitations, Scope, Preferences):  Full Comfort Care  Psycho-social/Spiritual:   Desire for further Chaplaincy support:no  Additional Recommendations: Caregiving  Support/Resources, Funeral Planning/Counseling and Grief/Bereavement Support  Prognosis:   Hours - Days expected. We discuss possibility of days.   Discharge Planning: Anticipated Hospital Death      Primary Diagnoses: Present on Admission: . 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 Alcoholic cirrhosis of liver with ascites ([place]) . Permanent atrial fibrillation ([place])   I have reviewed the medical record, interviewed the patient and family, and examined the patient. The following aspects are pertinent.  Past Medical History:  Diagnosis Date  . Acute left PCA stroke ([place]) 02/17/2015  . Anemia    vitamin b and vit d deficiencies  . 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 Atherosclerosis of native arteries of the extremities with gangrene ([place]) 05/08/2018  . Atrial fibrillation ([place])   . Blind    right eye  . Complication of anesthesia 2019   disoriented, trying to jump  out of bed after bka  . Coronary artery calcification seen on CT scan    a. 08/2018 CT Chest: Mild cor Ca2+.  . Diabetes mellitus with complication ([place])   . Dilated aortic root ([place])    a. 04/2018 Echo: 4.1cm. Asc Ao 3.5cm; b. 04/2018 CT: Asc Ao 3.6cm. 4.1cm @ sinus of Valsalva.  . Dysrhythmia    a fib  . Gangrene ([place]) 09/06/2019  . Gangrene of right foot ([place]) 06/02/2018  . GERD (gastroesophageal reflux disease)   . History of echocardiogram    a. 04/2018 Echo: Ef 60-65%, no rwma, midly to mod dil Ao root - 4.1cm. Asc Ao 3.5cm. Mild MR. Nl RV fxn. Nl PASP.  Marland Kitchen History of hernia repair   . History of stress  test    a. 2016 MV ([name]): EF 58%, no ischemia.  Marland Kitchen Hx of BKA, right ([place]) 06/16/2018  . Hyperlipidemia   . Hypertension   . Leg pain   . Legally blind    right eye homonymous hemianopsa  . Memory change    d/t strokes  . Necrotic toes ([place]) 02/21/2018  . PAF (paroxysmal atrial fibrillation) ([name])    a. on Eliquis as of 2018; b. CHADS2VASc => 5 (HTN, DM, stroke x 2, vascular disease)  . Peripheral vascular disease ([place])    a. followed by Dr. Lucky Cowboy; b. s/p kissing balloon stents and right external iliac stent in 10/2017; c. 05/2018 s/p R BKA; c. 08/2019: Bilat Iliac PTA & DBA or REIA.  . Pulmonary nodules   . S/P bilateral BKA (below knee amputation) ([place]) 06/11/2018  . Stroke [facility])    a. 2016 & 2018  . Stroke [facility]) 07/11/2018   Social History   Socioeconomic History  . Marital status: Married    Spouse name: debra  . Number of children: 0  . Years of education: Not on file  . Highest education level: Associate degree: academic program  Occupational History    Comment: disabled  Tobacco Use  . Smoking status: Former Smoker    Packs/day: 1.00    Years: 30.00    Pack years: 30.00    Types: Cigarettes    Quit date: 01/09/2018    Years since quitting: 1.9  . Smokeless tobacco: Never Used  Substance and Sexual Activity  . Alcohol use: Yes    Alcohol/week: 0.0 standard drinks    Comment: occasionally beer   . Drug use: No  . Sexual activity: Yes  Other Topics Concern  . Not on file  Social History Narrative   Lives with wife in a ranch style house.  Uses a walker or a wheelchair to get around.   Social Determinants of Health   Financial Resource Strain:   . Difficulty of Paying Living Expenses:   Food Insecurity:   . Worried About Charity fundraiser in the Last Year:   . Arboriculturist in the Last Year:   Transportation Needs:   . Film/video editor (Medical):   Marland Kitchen Lack of Transportation (Non-Medical):   Physical Activity:   . Days of Exercise per Week:   . Minutes  of Exercise per Session:   Stress:   . Feeling of Stress :   Social Connections:   . Frequency of Communication with Friends and Family:   . Frequency of Social Gatherings with Friends and Family:   . Attends Religious Services:   . Active Member of Clubs or Organizations:   . Attends Archivist Meetings:   .  Marital Status:    Family History  Problem Relation Age of Onset  . Diabetes Father   . Hypertension Father   . Hyperlipidemia Father    Scheduled Meds: . amiodarone  150 mg Intravenous Once  . chlorhexidine gluconate (MEDLINE KIT)  15 mL Mouth Rinse BID  . Chlorhexidine Gluconate Cloth  6 each Topical Daily  . fentaNYL (SUBLIMAZE) injection  25 mcg Intravenous Once  . mouth rinse  15 mL Mouth Rinse 10 times per day  . mupirocin ointment  1 application Nasal BID  . pantoprazole (PROTONIX) IV  40 mg Intravenous Daily   Continuous Infusions: . sodium chloride 75 mL/hr (01/04/20 1042)  . sodium chloride    . amiodarone 30 mg/hr (01/04/20 0500)  . dexmedetomidine (PRECEDEX) IV infusion 1.2 mcg/kg/hr (01/04/20 1031)  . fentaNYL infusion INTRAVENOUS 175 mcg/hr (01/04/20 1040)  . morphine    . phenylephrine (NEO-SYNEPHRINE) Adult infusion Stopped (01/03/2020 1751)  . vasopressin (PITRESSIN) infusion - *FOR SHOCK* 0.03 Units/min (01/04/20 0500)   PRN Meds:.acetaminophen **OR** acetaminophen, fentaNYL, LORazepam, ondansetron **OR** ondansetron (ZOFRAN) IV Medications Prior to Admission:  Prior to Admission medications   Medication Sig Start Date End Date Taking? Authorizing Provider  acetaminophen (TYLENOL) 325 MG tablet Take 2 tablets (650 mg total) by mouth every 6 (six) hours as needed for mild pain (or Fever >/= 101). 11/29/19  Yes Wieting, Richard, MD  acidophilus (RISAQUAD) CAPS capsule Take 1 capsule by mouth daily. 12/19/19  Yes Johnson, Megan P, DO  apixaban (ELIQUIS) 5 MG TABS tablet Take 1 tablet (5 mg total) by mouth 2 (two) times daily. 10/19/19  Yes Johnson,  Megan P, DO  cholecalciferol (VITAMIN D3) 25 MCG (1000 UT) tablet Take 5,000 Units by mouth daily.    Yes [provider]  docusate sodium (COLACE) 100 MG capsule Take 100 mg by mouth daily. 12/12/19  Yes [provider]  ferrous sulfate (FERROUSUL) 325 (65 FE) MG tablet Take 1 tablet (325 mg total) by mouth 2 (two) times daily with a meal. 03/14/19  Yes Earlie Server, MD  furosemide (LASIX) 20 MG tablet Take 1 tablet (20 mg total) by mouth daily. 12/19/19  Yes Johnson, Megan P, DO  gabapentin (NEURONTIN) 600 MG tablet Take 2 tablets (1,200 mg total) by mouth 3 (three) times daily. 12/19/19  Yes Johnson, Megan P, DO  hydrochlorothiazide (MICROZIDE) 12.5 MG capsule Take 12.5 mg by mouth daily. 12/31/19  Yes [provider]  metFORMIN (GLUCOPHAGE) 500 MG tablet Take 1 tablet (500 mg total) by mouth 2 (two) times daily with a meal. 12/19/19  Yes Johnson, Megan P, DO  metoprolol tartrate (LOPRESSOR) 50 MG tablet Take 1 tablet (50 mg total) by mouth 2 (two) times daily. 12/19/19  Yes Johnson, Megan P, DO  Multiple Vitamin (MULTIVITAMIN WITH MINERALS) TABS tablet Take 1 tablet by mouth daily. 06/24/17  Yes Fritzi Mandes, MD  oxyCODONE (OXY IR/ROXICODONE) 5 MG immediate release tablet Take 1-2 tablets (5-10 mg total) by mouth every 6 (six) hours as needed for up to 8 days for moderate pain, severe pain or breakthrough pain. 12/29/19 01/06/20 Yes Kris Hartmann, NP  pantoprazole (PROTONIX) 40 MG tablet Take 1 tablet (40 mg total) by mouth daily. 12/19/19  Yes Johnson, Megan P, DO  spironolactone (ALDACTONE) 25 MG tablet Take 1 tablet (25 mg total) by mouth daily. 12/19/19  Yes Johnson, Megan P, DO  traZODone (DESYREL) 50 MG tablet Take 0.5 tablets (25 mg total) by mouth at bedtime as needed for sleep.  12/19/19  Yes Johnson, Megan P, DO  vitamin C (ASCORBIC ACID) 500 MG tablet Take 500 mg by mouth daily.   Yes [provider]  Ensure Max Protein (ENSURE MAX PROTEIN) LIQD Take 330 mLs (11 oz  total) by mouth 2 (two) times daily between meals. 11/29/19   Loletha Grayer, MD   Allergies  Allergen Reactions  . Sodium Pentobarbital [Pentobarbital] Shortness Of Breath  . Lipitor [Atorvastatin] Rash   Review of Systems  Unable to perform ROS: Intubated    Physical Exam Vitals and nursing note reviewed.  Constitutional:      General: He is not in acute distress.    Appearance: He is ill-appearing.  Cardiovascular:     Rate and Rhythm: Normal rate.  Pulmonary:     Comments: Intubated/ventilated Skin:    General: Skin is warm and dry.  Neurological:     Comments: Intubated/ventilated     Vital Signs: BP (!) 89/64   Pulse 60   Temp 98.1 F (36.7 C)   Resp 20   Ht _0  (1.753 m)   Wt 82.1 kg   SpO2 99%   BMI 26.73 kg/m  Pain Scale: CPOT   Pain Score: 10-Worst pain ever   SpO2: SpO2: 99 % O2 Device:SpO2: 99 % O2 Flow Rate: .O2 Flow Rate (L/min): 4 L/min  IO: Intake/output summary:   Intake/Output Summary (Last 24 hours) at 01/04/2020 1302 Last data filed at 01/04/2020 1200 Gross per 24 hour  Intake 2522.04 ml  Output 610 ml  Net 1912.04 ml    LBM: Last BM Date: 01/16/2020 Baseline Weight: Weight: 68 kg Most recent weight: Weight: 82.1 kg     Palliative Assessment/Data:   Flowsheet Rows     Most Recent Value  Intake Tab  Referral Department  Critical care  Unit at Time of Referral  ICU  Palliative Care Primary Diagnosis  Sepsis/Infectious Disease  Date Notified  01/04/20  Palliative Care Type  New Palliative care  Reason for referral  Clarify Goals of Care  Date of Admission  12/04/2019  Date first seen by Palliative Care  01/04/20  # of days Palliative referral response time  0 Day(s)  # of days IP prior to Palliative referral  3  Clinical Assessment  Palliative Performance Scale Score  10%  Pain Max last 24 hours  Not able to report  Pain Min Last 24 hours  Not able to report  Dyspnea Max Last 24 Hours  Not able to report  Dyspnea Min Last 24  hours  Not able to report  Psychosocial & Spiritual Assessment  Palliative Care Outcomes      Time In: 1130 Time Out: 1220 Time Total: 50 minutes  Greater than 50%  of this time was spent counseling and coordinating care related to the above assessment and plan.  Signed by: Drue Novel, NP   Please contact Palliative Medicine Team phone at [phone] for questions and concerns.  For individual provider: See Shea Evans

## 2020-01-04 NOTE — Progress Notes (Signed)
[facility] Daily Progress Note   Subjective: Patient still intubated and sedated this AM.  No issues overnight.  Objective: Vitals:   01/04/20 0400 01/04/20 0500 01/04/20 0700 01/04/20 0800  BP: (!) 93/59 103/65 100/66 91/61  Pulse: 69 75 (!) 59 63  Resp: 20 20 20 20   Temp:  97.9 F (36.6 C)  97.6 F (36.4 C)  TempSrc:  Oral    SpO2: 98% 98% 98% 98%  Weight:      Height:        Intake/Output Summary (Last 24 hours) at 01/04/2020 1031 Last data filed at 01/04/2020 0500 Gross per 24 hour  Intake 3505.04 ml  Output 525 ml  Net 2980.04 ml   Physical Exam: Sedated and on vent, NAD [id number] rate and rhythm Pulmonary:Decreased bilaterally Abdomen: Soft, Nontender, Nondistended Vascular: Right lower extremity:OR VAC dressing changed.  Wound with minimal bloody drainage.  About 95% granulation tissue noted to wound.  See photo below.    Document Information Photos    01/04/2020 09:35  Attached To:  Hospital Encounter on 12/06/2019  Source Information Yazmen Briones, Janalyn Harder, PA-C  [facility]   Laboratory: CBC    Component Value Date/Time   WBC 21.9 (H) 01/04/2020 0441   HGB 7.6 (L) 01/04/2020 0441   HGB 11.2 (L) 10/23/2019 1403   HCT 22.2 (L) 01/04/2020 0441   HCT 35.0 (L) 10/23/2019 1403   PLT 122 (L) 01/04/2020 0441   PLT 199 10/23/2019 1403   BMET    Component Value Date/Time   NA 132 (L) 01/04/2020 0441   NA 140 10/23/2019 1403   K 3.8 01/04/2020 0441   CL 107 01/04/2020 0441   CO2 20 (L) 01/04/2020 0441   GLUCOSE 201 (H) 01/04/2020 0441   BUN 25 (H) 01/04/2020 0441   BUN 17 10/23/2019 1403   CREATININE 0.81 01/04/2020 0441   CALCIUM 7.5 (L) 01/04/2020 0441   GFRNONAA >60 01/04/2020 0441   GFRAA >60 01/04/2020 0441   Assessment/Planning:  1) s/p redo right above-knee amputation (guillotine), placement of wound VAC - Patient is still on pressors, sedated and vented - managed by ICU  - positive blood cultures -  on IV ABX, ID following  -OR VAC change today.  About 95% granulation tissue.  Minimal oozing of blood.  Next VAC change is planned for Monday.  Appreciate assistance from wound care nurse.  If patient is still intubated and sedated on Monday will change at the bedside.  If extubated will plan on changing the OR  change and operating room.  Discussed with Dr. Ellis Parents Ailene Royal PA-C 01/04/2020 10:31 AM

## 2020-01-04 NOTE — Progress Notes (Addendum)
   01/04/20 1025  Clinical Encounter Type  Visited With Patient;Health care provider  Visit Type Follow-up  Referral From Chaplain  Consult/Referral To Chaplain  Chaplain stopped in to visit with patient and wife. Nurse said wife was not her. Chaplain talked with nurse for a little while. Chaplain will check back in later to visit with wife.

## 2020-01-04 NOTE — Progress Notes (Signed)
CH briefly visited family per [facility]'s referral; family gathered around pt.'s bed; appear to be coping effectively and supporting one another.  Family denies any needs at this time; [facility] remains available this evening for any pt./family needs.    01/04/20 1445  Clinical Encounter Type  Visited With Patient and family together  Visit Type Follow-up;Critical Care;Patient actively dying;Social support;Spiritual support;Psychological support  Referral From Chaplain  Spiritual Encounters  Spiritual Needs Grief support

## 2020-01-04 NOTE — Progress Notes (Signed)
Results for Booze, Wilton B JR. "BEN" (MRN [id number]) as of 01/04/2020 12:40  Ref. Range 01/03/2020 03:43 01/03/2020 07:17 01/03/2020 11:12 01/03/2020 15:57 01/03/2020 21:29  Glucose-Capillary Latest Ref Range: 70 - 99 mg/dL 200 (H) 218 (H) 214 (H) 198 (H) 228 (H)  Noted that blood sugars continue to be greater than 180 mg/dl.  Recommend adding Novolog SENSITIVE correction scale every 4 hours or following the ICU hyperglycemia order set. Patient will heal better with blood sugars within glycemic standards of care for CBG limits of less than 180 mg/dl.  Harvel Ricks RN BSN CDE Diabetes Coordinator Pager: [phone]  8am-5pm

## 2020-01-04 NOTE — Progress Notes (Signed)
1300 Terminally extubated to room air per orders. Never opened eyes or spoke after extubation. Respirations regular at 9-16. Oxygen saturation 88-92% on room air. Family in room after extubation with chaplain.

## 2020-01-04 NOTE — Progress Notes (Signed)
Nutrition Brief Follow-Up Note  Chart reviewed. Patient now transitioning to comfort care.   No further nutrition interventions warranted at this time. Please re-consult RD as needed.   Kitana Gage King, MS, RD, LDN Pager number available on Amion   

## 2020-01-04 NOTE — Progress Notes (Signed)
Tolerating comfort care. Family remains at bedside. Now in SB. Respirations 23

## 2020-01-04 NOTE — Consult Note (Signed)
Pharmacy Antibiotic Note  [name]. is a 64 y.o. male admitted on 12/31/2019 with cellulitis following AKA in March 2021.  He presents with pain, elevated WBC, and fevers, and 2/4 blood cultured bottles with MRSA. Repeat blood cultures 6/2 pending. Patient is s/p redo of the right AKA via guillotine with placement of wound VAC. Pharmacy was consulted for vancomycin dosing.  Plan:  Continue vancomycin 1000 mg IV q12h. Patient's renal function remains stable. Vancomycin trough ordered 6/4 at 0700 prior to morning dose. Will adjust dose based on results.  Height: 5\' 9"  (175.3 cm) Weight: 82.1 kg (181 lb) IBW/kg (Calculated) : 70.7  Temp (24hrs), Avg:97.5 F (36.4 C), Min:96.6 F (35.9 C), Max:97.9 F (36.6 C)  Recent Labs  Lab 12/31/2019 0358 12/20/2019 0358 12/19/2019 0545 12/25/2019 1712 12/11/2019 2037 01/10/2020 0424 01/03/20 0431 01/03/20 0440 01/04/20 0441  WBC 12.8*   < >  --  13.8* 12.7* 23.3*  --  18.7* 21.9*  CREATININE 0.69  --   --   --   --  0.88 0.86  --  0.81  LATICACIDVEN 1.2  --  1.0  --   --   --   --   --   --    < > = values in this interval not displayed.    Estimated Creatinine Clearance: 90.9 mL/min (by C-G formula based on SCr of 0.81 mg/dL).    Antimicrobials this admission: 5/31 cefepime >> 5/31 5/31 flagyl >> 5/31 5/31 vancomycin >>  Microbiology results: 5/31 BCx: MRSA 6/02 BCx: no growth, pending 5/31 UCx: Negative 5/31 COVID (nasopharyngeal swab): Negative  Thank you for allowing pharmacy to be a part of this patient's care.  Tawnya Crook, PharmD 01/04/2020 11:27 AM

## 2020-01-04 NOTE — Consult Note (Signed)
WOC Nurse Consult Note: Reason for Consult: NPWT dressing change to R AKA site at bedside with patient on vent and sedated. Linus Salmons, Vascular PA in attend ence, assists with dressing change and photographs wound. Her expertise is appreciated. Wound type: Surgical, chronic full thickness (LE) Pressure Injury POA: N/A Measurement: Right AKA:  14cm x 19cm x 1cm, beefy red, serosanguinous exudate in moderate amount LLE:  3.5cm x 1.8cm x 0.2cm with no exudate. Dry red wound bed with dried serum (scabing) Wound bed:As noted above Drainage (amount, consistency, odor) As noted above Periwound:intact, dry Dressing procedure/placement/frequency: K. Stegmayer took down surgical dressing, wound cleansed, measured and a photo is taken for the EMR. Black foam (2 pieces) used to obliterate dead space. Dressing is secured with ioban obtained from [place].  Three pieces of drape (VAC) used to reinforce ioban at pleats and an immediate seal is achieved at 167mmHg continuous negative pressure.  Patient is medicated several times throughout procedure. Orders are provided for the chronic LLE wound. The left heel will be floated in a [employer].  Next planned dressing change is Monday, 01/08/20. This will take place either in the OR or at the bedside with Vascular. To be determined that morning by surgical team.  [place] nursing team will follow, and will remain available to this patient, the nursing and medical teams.   Thanks, Maudie Flakes, MSN, RN, Ramos, Arther Abbott  Pager# [phone]

## 2020-01-04 NOTE — Progress Notes (Signed)
Pt was suctioned prior to extubation for a small amount of secretions. He was extubated and placed on comfort care.

## 2020-01-04 NOTE — Progress Notes (Signed)
   01/04/20 1330  Clinical Encounter Type  Visited With Patient and family together  Visit Type Follow-up;Critical Care  Referral From Chaplain  Consult/Referral To [place] visited with patient's family as they stood and sat at bedside. Patient's sister quietly whispered a prayer in her brother's ear. Patient's sister told several stories about her and patient as they grew up. Although patient's sister prayed, chaplain asked permission to pray for the entire family. When chaplain was leaving, it appeared patient was uncomfortable. Chaplain told nurse and she said she wold go in and give him morphine. Chaplain asked chaplain on-call to check in on this family.

## 2020-01-05 ENCOUNTER — Ambulatory Visit (INDEPENDENT_AMBULATORY_CARE_PROVIDER_SITE_OTHER): Payer: Medicare HMO | Admitting: Vascular Surgery

## 2020-01-05 LAB — SURGICAL PATHOLOGY

## 2020-01-05 MED ORDER — GLYCOPYRROLATE 1 MG PO TABS: 1.0000 mg | ORAL_TABLET | ORAL | Status: DC | PRN

## 2020-01-05 MED ORDER — GLYCOPYRROLATE 1 MG PO TABS
1.0000 mg | ORAL_TABLET | ORAL | Status: DC | PRN
  Filled 2020-01-05: qty 1

## 2020-01-05 MED ORDER — MORPHINE BOLUS VIA INFUSION
5.0000 mg | INTRAVENOUS | Status: DC | PRN
  Filled 2020-01-05: qty 5

## 2020-01-05 MED ORDER — GLYCOPYRROLATE 0.2 MG/ML IJ SOLN: 0.4000 mg | INTRAMUSCULAR | Status: DC | PRN

## 2020-01-05 MED ORDER — GLYCOPYRROLATE 0.2 MG/ML IJ SOLN: 0.2000 mg | INTRAMUSCULAR | Status: DC | PRN

## 2020-01-08 ENCOUNTER — Ambulatory Visit: Payer: Medicare HMO | Admitting: Gastroenterology

## 2020-01-08 LAB — CULTURE, BLOOD (ROUTINE X 2)
Culture: NO GROWTH
Culture: NO GROWTH
Special Requests: ADEQUATE

## 2020-01-08 SURGERY — IRRIGATION AND DEBRIDEMENT EXTREMITY
Anesthesia: General | Laterality: Right

## 2020-01-30 ENCOUNTER — Ambulatory Visit: Payer: Medicare HMO | Admitting: Family Medicine

## 2020-02-01 NOTE — Progress Notes (Signed)
   2020-01-24 1100  Clinical Encounter Type  Visited With Family  Visit Type Follow-up  Referral From Chaplain  Consult/Referral To Chaplain  I saw Patient's wife and sister of them on their way in this morning and spoke to the wife briefly.   On my way to patient's room, I see his wife and sister on the first floor of th hospital, teary eyed. I asked if he had passed and they said yes. The three of [place] hugged and I prayed for them. Chaplain encouraged family to stay together.

## 2020-02-01 NOTE — Progress Notes (Signed)
Pt transported to morgue at this time.

## 2020-02-01 NOTE — Progress Notes (Signed)
Palliative:  Patient unresponsive to voice and touch. Breathing appears slightly labored. Loud respirations d/t respiratory secretions. HR 140s. Extremities cool and mottled.  Wife and sister at bedside. Discussed symptom management plan. Discussed that noise with respirations was d/t secretions he could not clear but did not indicate he was suffering. Educated on signs of imminent death. Discussed prognosis likely hours. They express understanding. Emotional support provided.  Plan: Add morphine boluses as needed, increase robinul dose. Discussed plan with RN. Anticipate hospital death today.   15 minutes  Greater than 50%  of this time was spent counseling and coordinating care related to the above assessment and plan.  Juel Burrow, DNP, AGNP-C Palliative Medicine Team Team Phone # [phone]  Pager # [phone]

## 2020-02-01 NOTE — Progress Notes (Signed)
Pt passed at this time with family at bedside. TOD 1030. CDS notified.

## 2020-02-01 NOTE — Death Summary Note (Signed)
Name: [name]. MRN: [id number] DOB: September 12, 1955    ADMISSION DATE:  12/16/2019 CONSULTATION DATE:  01/23/2020  REFERRING MD :  Dr. Trula Slade  CHIEF COMPLAINT:  Fever, pain of Right AKA site  BRIEF PATIENT DESCRIPTION:  64 y.o. Male with PMH of liver cirrhosis, ascites, Hepatitis B, and Chronic A-fib on Eliquis,  admitted 12/29/2019 with Sepsis and MRSA Bacteremia in setting of surgical site infection of Right AKA (Right BKA performed in March, with sepsis of BKA in April 2021 requiring conversion to AKA and further I&D).  His wound VAC was removed on 12/24/2019.  On 01/06/2020 pt with hemorrhagic shock due to hemorrhage from AKA site requiring intubation and emergently taken to OR for redo of right AKA.  SIGNIFICANT EVENTS  5/31: Admission to Med/Surg unit 5/31: Removal of wound VAC by vascular surgery 5/31: Oozing from site despite pressure dressing and reinforcement of site, 2 point drop in hemoglobin; transfer to Stepdown 6/1: Hemorrhaging from site with hemorrhagic shock, required emergent intubation 6/1: Take to OR emergently for redo of right AKA 6/2-had family meeting with wife today discussed overall poor prognosis with numerous severe comorbid conditions and active life-threatening disease, wife agrees and will discuss with remainder of family regarding transitioning to comfort care with de-escalation of current therapy.  CODE STATUS has not been changed yet until family discusses amongst themselves and meet with [place] again. 01/04/20- Had lengthy goal of care discussion today. Details of family conference are outlined by Palliative care team who was present during discourse.   Patient transitioned to comfort care only.    PATIENT PASSED AWAY WITH FAMILY AT BEDSIDE AT 1030 2020-02-01 AFTER MAKING PATIENT COMFORT CARE AND WITHDRAWAL OF MEDICAL CARE AS PER POA.   STUDIES:  5/31: CXR>>There is mild cardiomegaly. There is slight prominence of the central pulmonary vasculature. No large airspace  consolidation or pleural effusion. No acute osseous abnormality. 6/1: CXR>>There is mild cardiomegaly. Aortic knob calcifications. ETT is 3.7 cm above the carina. A right-sided central venous catheter seen with the tip in the mid SVC. OG tube is seen within the proximal stomach. There is mild prominence of the central pulmonary vasculature. No pneumothorax is seen. 6/1: KUB>>Mildly dilated small bowel loops which may be due to partial small bowel obstruction versus ileus. OG tube within the proximal stomach.  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 ED on 12/06/2019 due to fever and significant pain at the right AKA site.  His wife reported that since his debridement has had constant pain, which acutely worsened of late.  He denied chest pain, SOB, cough, abdominal pain, or change in bowel habits.  Upon presentation to the ED he was noted to be febrile with temperature 101.8, blood pressure 119/78, heart rate 124, respirations 20, with O2 saturations 90% on room air.  Work-up was significant for WBC 12.8, lactic acid 1.2, hemoglobin 9.3, high-sensitivity troponin 51.  Urinalysis without infection.  His SARS-CoV-2 PCR is negative.  He was admitted to the MedSurg unit by the Hospitalist for  further work-up and treatment of sepsis secondary to surgical wound infection of his right AKA.  Vascular surgery was consulted.  Blood cultures came back positive for MRSA, of  which infectious disease was subsequently consulted.  On 5/31 his wound VAC was removed by vascular surgery.  Post VAC removal he was noted to have continued oozing from his wound despite efforts to reinforce the dressing and with the placement of a pressure dressing.  Rapid response was called due to continued bleeding and tachycardia.  He was found to have a two-point drop in his hemoglobin along with INR of 2.8.  He was subsequently transferred to stepdown, and is to receive blood and FFP to reverse his coagulopathy.  Early in the morning on 01/12/2020 he developed profuse bleeding from his AKA site with hemorrhagic shock and hemodynamic instability requiring emergent intubation.  Vascular surgery is at bedside with plans to take patient emergently to the OR for redo right AKA.  PCCM is consulted for further management of hemorrhagic shock.  PAST MEDICAL HISTORY :   has a past medical history of Acute left PCA stroke ([place]) (02/17/2015), Anemia, Arteriovenous malformation of gastrointestinal tract, Atherosclerosis of native arteries of the extremities with gangrene ([place]) (05/08/2018), Atrial fibrillation ([place]), Blind, Complication of anesthesia (2019), Coronary artery calcification seen on CT scan, Diabetes mellitus with complication ([place]), Dilated aortic root ([place]), Dysrhythmia, Gangrene ([place]) (09/06/2019), Gangrene of right foot ([place]) (06/02/2018), GERD (gastroesophageal reflux disease), History of echocardiogram, History of hernia repair, History of stress test, BKA, right ([place]) (06/16/2018), Hyperlipidemia, Hypertension, Leg pain, Legally blind, Memory change, Necrotic toes ([place]) (02/21/2018), PAF (paroxysmal atrial fibrillation) ([place]), Peripheral vascular disease ([place]), Pulmonary nodules, S/P bilateral BKA (below knee amputation) ([place]) (06/11/2018), Stroke ([place]), and Stroke ([place]) (07/11/2018).  has a past surgical history that includes Appendectomy; Lower Extremity Angiography (Right, 10/25/2017); LOWER EXTREMITY  INTERVENTION (10/25/2017); Tonsillectomy; Lower Extremity Angiography (Right, 01/13/2018); Colonoscopy with propofol (N/A, 05/12/2018); Esophagogastroduodenoscopy (egd) with propofol (N/A, 05/12/2018); Amputation (Right, 06/02/2018); Givens capsule study (N/A, 07/13/2018); Wound debridement (Right, 08/08/2018); Skin split graft (Right, 12/28/2018); Lower Extremity Angiography (Right, 08/28/2019); Hernia repair; Abdominal aorta stent; Amputation (Right, 10/26/2019); Leg amputation (Right, 10/26/2019); Incision and drainage (Right, 11/11/2019); and Amputation (Right, 01/16/2020). Prior to Admission medications   Medication Sig Start Date End Date Taking? Authorizing Provider  acetaminophen (TYLENOL) 325 MG tablet Take 2 tablets (650 mg total) by mouth every 6 (six) hours as needed for mild pain (or Fever >/= 101). 11/29/19  Yes Wieting, Richard, MD  acidophilus (RISAQUAD) CAPS capsule Take 1 capsule by mouth daily. 12/19/19  Yes Johnson, Megan P, DO  apixaban (ELIQUIS) 5 MG TABS tablet Take 1 tablet (5 mg total) by mouth 2 (two) times daily. 10/19/19  Yes Johnson, Megan P, DO  cholecalciferol (VITAMIN D3) 25 MCG (1000 UT) tablet Take 5,000 Units by mouth daily.    Yes [provider]  docusate sodium (COLACE) 100 MG capsule Take 100 mg by mouth daily. 12/12/19  Yes [provider]  ferrous sulfate (FERROUSUL) 325 (65 FE) MG tablet Take 1 tablet (325 mg total) by mouth 2 (two) times daily with a meal. 03/14/19  Yes Earlie Server, MD  furosemide (LASIX) 20 MG tablet Take 1 tablet (20 mg total) by mouth daily. 12/19/19  Yes Johnson, Megan P, DO  gabapentin (NEURONTIN) 600 MG tablet Take 2 tablets (1,200 mg total) by mouth 3 (three) times daily. 12/19/19  Yes Johnson, Megan P, DO  hydrochlorothiazide (MICROZIDE) 12.5 MG capsule Take 12.5 mg by mouth  daily. 12/31/19  Yes [provider]  metFORMIN (GLUCOPHAGE) 500 MG tablet Take 1 tablet (500 mg total) by mouth 2 (two) times daily with a meal. 12/19/19  Yes  Johnson, Megan P, DO  metoprolol tartrate (LOPRESSOR) 50 MG tablet Take 1 tablet (50 mg total) by mouth 2 (two) times daily. 12/19/19  Yes Johnson, Megan P, DO  Multiple Vitamin (MULTIVITAMIN WITH MINERALS) TABS tablet Take 1 tablet by mouth daily. 06/24/17  Yes Fritzi Mandes, MD  oxyCODONE (OXY IR/ROXICODONE) 5 MG immediate release tablet Take 1-2 tablets (5-10 mg total) by mouth every 6 (six) hours as needed for up to 8 days for moderate pain, severe pain or breakthrough pain. 12/29/19 01/06/20 Yes Kris Hartmann, NP  pantoprazole (PROTONIX) 40 MG tablet Take 1 tablet (40 mg total) by mouth daily. 12/19/19  Yes Johnson, Megan P, DO  spironolactone (ALDACTONE) 25 MG tablet Take 1 tablet (25 mg total) by mouth daily. 12/19/19  Yes Johnson, Megan P, DO  traZODone (DESYREL) 50 MG tablet Take 0.5 tablets (25 mg total) by mouth at bedtime as needed for sleep. 12/19/19  Yes Johnson, Megan P, DO  vitamin C (ASCORBIC ACID) 500 MG tablet Take 500 mg by mouth daily.   Yes [provider]  Ensure Max Protein (ENSURE MAX PROTEIN) LIQD Take 330 mLs (11 oz total) by mouth 2 (two) times daily between meals. 11/29/19   Loletha Grayer, MD   Allergies  Allergen Reactions  . Sodium Pentobarbital [Pentobarbital] Shortness Of Breath  . Lipitor [Atorvastatin] Rash    FAMILY HISTORY:  family history includes Diabetes in his father; Hyperlipidemia in his father; Hypertension in his father. SOCIAL HISTORY:  reports that he quit smoking about 2 years ago. His smoking use included cigarettes. He has a 30.00 pack-year smoking history. He has never used smokeless tobacco. He reports current alcohol use. He reports that he does not use drugs.     REVIEW OF SYSTEMS:   Unable to assess due to critical illness and intubation  SUBJECTIVE:  Unable to assess due to critical illness and intubation  VITAL SIGNS:    PHYSICAL EXAMINATION: General:  Critically ill appearing male, laying in bed, intubated and sedated, in  NAD Neuro:  Sedated, withdraws from pain, Pupils PERRLA HEENT:  Atraumatic, normocephalic, neck supple, no JVD Cardiovascular:  Irregularly irregular rhythm, rate controlled Lungs:  Clear to auscultation bilaterally, even, vent assisted Abdomen:  Soft, nontender, nondistended, no guarding or rebound tenderness, BS+ x4 Musculoskeletal:  Right AKA w/ Wound VAC clean, dry, and intact; no edema Skin:  Warm and dry.  No obvious rashes, lesions, or ulcerations  No results for input(s): NA, K, CL, CO2, BUN, CREATININE, GLUCOSE in the last 168 hours. No results for input(s): HGB, HCT, WBC, PLT in the last 168 hours. No results found.  ASSESSMENT / PLAN:  Hemorrhagic shock +/- Septic shock Atrial fibrillation with RVR Hx: Chronic A-fib on Eliquis -Continuous cardiac monitoring -Maintain MAP greater than 65 -IV fluids -Vasopressors as needed to maintain map goal -Transfusions as indicated -Amiodarone drip -No anticoagulation due to hemorrhage  Acute Blood loss anemia in setting of hemorrhage from Right AKA Supratherapeutic INR in setting of liver cirrhosis & Eliquis -Monitor for S/Sx of bleeding -Trend CBC -SCD's for VTE Prophylaxis (no chemical prophylaxis due to hemorrhage) -Transfuse for Hgb <8 -To receive FFP,  Kcentra, & Vitamin K (goal INR <2, Plt >50, Fibrinogen >100) -Hold Eliquis -Vascular Surgery to take emergently to OR for redo of AKA  Acute  Hypoxic Respiratory Failure due to severe Shock & Hemodynamic Instabiity -Full vent support -Wean FiO2 and PEEP as tolerated to maintain O2 saturations greater than 92% -Follow intermittent chest x-ray and ABG as needed -VAP protocol -Spontaneous breathing trials when respiratory parameters met  Sepsis & MRSA Bacteremia due to infected Right AKA -Monitor fever curve -Trend WBCs and procalcitonin -Follow cultures as above -Continue vancomycin -ID consulted, appreciate input -Vascular Surgery following, appreciate  input  Diabetes Mellitus Type 2 -CBG's -SSI -Follow ICU Hypo/hyperglycemia protocol     BEST PRACTICES DISPOSITION: ICU GOALS OF CARE: Full code VTE PROPHYLAXIS: SCD's STRESS ULCER PROPHYLAXIS: IV Protonix CONSULTS: Hospitalist (primary service), Vascular Surgery, Infectious Disease UPDATES: Pt's wife updated by Dr. Trula Slade with Vascular Surgery and [name], NP  Critical care provider statement:    Critical care time (minutes):  33   Critical care time was exclusive of:  Separately billable procedures and  treating other patients   Critical care was necessary to treat or prevent imminent or  life-threatening deterioration of the following conditions:  hemmoragic shock, hepatitis B, MRSA bacteremia, septic shock, necrotic lower extermity wound, multiple comorbid conditions   Critical care was time spent personally by me on the following  activities:  Development of treatment plan with patient or surrogate,  discussions with consultants, evaluation of patient's response to  treatment, examination of patient, obtaining history from patient or  surrogate, ordering and performing treatments and interventions, ordering  and review of laboratory studies and re-evaluation of patient's condition   I assumed direction of critical care for this patient from another  provider in my specialty: no     Ottie Glazier, M.D.  Pulmonary & [place]    01/21/2020, 3:36 PM

## 2020-02-01 DEATH — deceased

## 2020-02-14 ENCOUNTER — Ambulatory Visit: Payer: Medicare HMO | Admitting: Cardiovascular Disease

## 2020-03-20 ENCOUNTER — Other Ambulatory Visit: Payer: Medicare HMO

## 2020-03-22 ENCOUNTER — Ambulatory Visit: Payer: Medicare HMO | Admitting: Oncology

## 2020-04-15 ENCOUNTER — Ambulatory Visit: Payer: Medicare HMO | Admitting: Cardiovascular Disease

## 2020-04-22 ENCOUNTER — Ambulatory Visit: Payer: Medicare HMO | Admitting: Family Medicine
